# Patient Record
Sex: Female | Born: 1935
Health system: Southern US, Community
[De-identification: ages and names within clinical notes are randomized; demographics above are authoritative.]

## PROBLEM LIST (undated history)

## (undated) DIAGNOSIS — I5032 Chronic diastolic (congestive) heart failure: Secondary | ICD-10-CM

## (undated) DIAGNOSIS — K5900 Constipation, unspecified: Secondary | ICD-10-CM

## (undated) DIAGNOSIS — E785 Hyperlipidemia, unspecified: Secondary | ICD-10-CM

## (undated) DIAGNOSIS — E871 Hypo-osmolality and hyponatremia: Secondary | ICD-10-CM

## (undated) DIAGNOSIS — F419 Anxiety disorder, unspecified: Secondary | ICD-10-CM

## (undated) DIAGNOSIS — Q2112 Patent foramen ovale: Secondary | ICD-10-CM

## (undated) DIAGNOSIS — R002 Palpitations: Secondary | ICD-10-CM

## (undated) DIAGNOSIS — I253 Aneurysm of heart: Secondary | ICD-10-CM

## (undated) DIAGNOSIS — K760 Fatty (change of) liver, not elsewhere classified: Secondary | ICD-10-CM

## (undated) DIAGNOSIS — M545 Low back pain, unspecified: Secondary | ICD-10-CM

## (undated) DIAGNOSIS — F32A Depression, unspecified: Secondary | ICD-10-CM

## (undated) DIAGNOSIS — K648 Other hemorrhoids: Secondary | ICD-10-CM

## (undated) DIAGNOSIS — I639 Cerebral infarction, unspecified: Secondary | ICD-10-CM

## (undated) DIAGNOSIS — I2721 Secondary pulmonary arterial hypertension: Secondary | ICD-10-CM

## (undated) DIAGNOSIS — F329 Major depressive disorder, single episode, unspecified: Secondary | ICD-10-CM

## (undated) DIAGNOSIS — Q211 Atrial septal defect: Secondary | ICD-10-CM

## (undated) DIAGNOSIS — I10 Essential (primary) hypertension: Secondary | ICD-10-CM

## (undated) DIAGNOSIS — E039 Hypothyroidism, unspecified: Secondary | ICD-10-CM

## (undated) DIAGNOSIS — I214 Non-ST elevation (NSTEMI) myocardial infarction: Secondary | ICD-10-CM

## (undated) DIAGNOSIS — G8929 Other chronic pain: Secondary | ICD-10-CM

## (undated) DIAGNOSIS — J189 Pneumonia, unspecified organism: Secondary | ICD-10-CM

## (undated) DIAGNOSIS — K219 Gastro-esophageal reflux disease without esophagitis: Secondary | ICD-10-CM

## (undated) HISTORY — DX: Aneurysm of heart: I25.3

## (undated) HISTORY — DX: Fatty (change of) liver, not elsewhere classified: K76.0

## (undated) HISTORY — DX: Atrial septal defect: Q21.1

## (undated) HISTORY — DX: Other hemorrhoids: K64.8

## (undated) HISTORY — DX: Hypo-osmolality and hyponatremia: E87.1

## (undated) HISTORY — PX: CATARACT EXTRACTION W/ INTRAOCULAR LENS  IMPLANT, BILATERAL: SHX1307

## (undated) HISTORY — DX: Chronic diastolic (congestive) heart failure: I50.32

## (undated) HISTORY — DX: Essential (primary) hypertension: I10

## (undated) HISTORY — DX: Anxiety disorder, unspecified: F41.9

## (undated) HISTORY — DX: Patent foramen ovale: Q21.12

## (undated) HISTORY — DX: Constipation, unspecified: K59.00

## (undated) HISTORY — DX: Palpitations: R00.2

## (undated) HISTORY — DX: Gastro-esophageal reflux disease without esophagitis: K21.9

## (undated) HISTORY — DX: Cerebral infarction, unspecified: I63.9

## (undated) HISTORY — DX: Hyperlipidemia, unspecified: E78.5

## (undated) HISTORY — PX: ABDOMINAL HYSTERECTOMY: SHX81

## (undated) HISTORY — PX: VAGINAL HYSTERECTOMY: SUR661

## (undated) HISTORY — DX: Hypothyroidism, unspecified: E03.9

---

## 1999-10-29 ENCOUNTER — Emergency Department (HOSPITAL_COMMUNITY): Admission: EM | Admit: 1999-10-29 | Discharge: 1999-10-29 | Payer: Self-pay

## 1999-11-07 ENCOUNTER — Encounter: Payer: Self-pay | Admitting: Obstetrics

## 1999-11-07 ENCOUNTER — Ambulatory Visit (HOSPITAL_COMMUNITY): Admission: RE | Admit: 1999-11-07 | Discharge: 1999-11-07 | Payer: Self-pay | Admitting: Obstetrics

## 1999-12-29 ENCOUNTER — Encounter: Payer: Self-pay | Admitting: Cardiology

## 1999-12-29 ENCOUNTER — Ambulatory Visit (HOSPITAL_COMMUNITY): Admission: RE | Admit: 1999-12-29 | Discharge: 1999-12-29 | Payer: Self-pay | Admitting: Cardiology

## 2000-03-13 ENCOUNTER — Encounter: Payer: Self-pay | Admitting: Cardiology

## 2000-03-13 ENCOUNTER — Ambulatory Visit (HOSPITAL_COMMUNITY): Admission: RE | Admit: 2000-03-13 | Discharge: 2000-03-13 | Payer: Self-pay | Admitting: Cardiology

## 2000-04-01 ENCOUNTER — Emergency Department (HOSPITAL_COMMUNITY): Admission: EM | Admit: 2000-04-01 | Discharge: 2000-04-01 | Payer: Self-pay | Admitting: Emergency Medicine

## 2000-06-27 ENCOUNTER — Encounter: Payer: Self-pay | Admitting: Cardiology

## 2000-06-27 ENCOUNTER — Ambulatory Visit (HOSPITAL_COMMUNITY): Admission: RE | Admit: 2000-06-27 | Discharge: 2000-06-27 | Payer: Self-pay | Admitting: Cardiology

## 2001-02-25 ENCOUNTER — Encounter: Payer: Self-pay | Admitting: *Deleted

## 2001-02-25 ENCOUNTER — Ambulatory Visit (HOSPITAL_COMMUNITY): Admission: RE | Admit: 2001-02-25 | Discharge: 2001-02-25 | Payer: Self-pay | Admitting: *Deleted

## 2001-07-17 ENCOUNTER — Emergency Department (HOSPITAL_COMMUNITY): Admission: EM | Admit: 2001-07-17 | Discharge: 2001-07-17 | Payer: Self-pay | Admitting: Emergency Medicine

## 2001-09-04 ENCOUNTER — Encounter: Admission: RE | Admit: 2001-09-04 | Discharge: 2001-09-04 | Payer: Self-pay | Admitting: Family Medicine

## 2001-09-04 ENCOUNTER — Encounter: Payer: Self-pay | Admitting: Family Medicine

## 2001-09-23 ENCOUNTER — Ambulatory Visit (HOSPITAL_COMMUNITY): Admission: RE | Admit: 2001-09-23 | Discharge: 2001-09-23 | Payer: Self-pay | Admitting: Cardiology

## 2002-02-17 ENCOUNTER — Other Ambulatory Visit: Admission: RE | Admit: 2002-02-17 | Discharge: 2002-02-17 | Payer: Self-pay | Admitting: Obstetrics and Gynecology

## 2002-04-22 ENCOUNTER — Encounter: Admission: RE | Admit: 2002-04-22 | Discharge: 2002-04-22 | Payer: Self-pay | Admitting: Gastroenterology

## 2002-04-22 ENCOUNTER — Encounter: Payer: Self-pay | Admitting: Gastroenterology

## 2002-05-20 ENCOUNTER — Ambulatory Visit (HOSPITAL_COMMUNITY): Admission: RE | Admit: 2002-05-20 | Discharge: 2002-05-20 | Payer: Self-pay | Admitting: Gastroenterology

## 2002-09-30 ENCOUNTER — Ambulatory Visit (HOSPITAL_COMMUNITY): Admission: RE | Admit: 2002-09-30 | Discharge: 2002-09-30 | Payer: Self-pay | Admitting: Obstetrics and Gynecology

## 2002-09-30 ENCOUNTER — Encounter: Payer: Self-pay | Admitting: Obstetrics and Gynecology

## 2002-12-22 ENCOUNTER — Encounter: Payer: Self-pay | Admitting: Family Medicine

## 2002-12-22 ENCOUNTER — Encounter: Admission: RE | Admit: 2002-12-22 | Discharge: 2002-12-22 | Payer: Self-pay | Admitting: Family Medicine

## 2003-03-30 ENCOUNTER — Encounter: Payer: Self-pay | Admitting: Family Medicine

## 2003-03-30 ENCOUNTER — Ambulatory Visit (HOSPITAL_COMMUNITY): Admission: RE | Admit: 2003-03-30 | Discharge: 2003-03-30 | Payer: Self-pay | Admitting: Family Medicine

## 2003-10-06 ENCOUNTER — Ambulatory Visit (HOSPITAL_COMMUNITY): Admission: RE | Admit: 2003-10-06 | Discharge: 2003-10-06 | Payer: Self-pay | Admitting: Family Medicine

## 2004-02-16 ENCOUNTER — Encounter: Admission: RE | Admit: 2004-02-16 | Discharge: 2004-02-16 | Payer: Self-pay | Admitting: Family Medicine

## 2004-07-19 ENCOUNTER — Ambulatory Visit (HOSPITAL_COMMUNITY): Admission: RE | Admit: 2004-07-19 | Discharge: 2004-07-19 | Payer: Self-pay | Admitting: Cardiology

## 2004-07-26 ENCOUNTER — Ambulatory Visit: Admission: RE | Admit: 2004-07-26 | Discharge: 2004-07-26 | Payer: Self-pay | Admitting: Family Medicine

## 2005-06-08 ENCOUNTER — Encounter: Admission: RE | Admit: 2005-06-08 | Discharge: 2005-06-08 | Payer: Self-pay | Admitting: Family Medicine

## 2005-12-19 ENCOUNTER — Emergency Department (HOSPITAL_COMMUNITY): Admission: EM | Admit: 2005-12-19 | Discharge: 2005-12-19 | Payer: Self-pay | Admitting: Emergency Medicine

## 2006-06-10 ENCOUNTER — Encounter (HOSPITAL_COMMUNITY): Admission: RE | Admit: 2006-06-10 | Discharge: 2006-08-14 | Payer: Self-pay | Admitting: Cardiology

## 2006-12-11 ENCOUNTER — Encounter: Admission: RE | Admit: 2006-12-11 | Discharge: 2006-12-11 | Payer: Self-pay | Admitting: Family Medicine

## 2007-01-15 ENCOUNTER — Ambulatory Visit: Payer: Self-pay | Admitting: Cardiology

## 2007-01-15 ENCOUNTER — Encounter: Payer: Self-pay | Admitting: Cardiology

## 2007-01-15 ENCOUNTER — Inpatient Hospital Stay (HOSPITAL_COMMUNITY): Admission: EM | Admit: 2007-01-15 | Discharge: 2007-01-16 | Payer: Self-pay | Admitting: Internal Medicine

## 2007-08-12 ENCOUNTER — Encounter: Admission: RE | Admit: 2007-08-12 | Discharge: 2007-11-10 | Payer: Self-pay | Admitting: Family Medicine

## 2007-12-19 ENCOUNTER — Encounter: Admission: RE | Admit: 2007-12-19 | Discharge: 2007-12-19 | Payer: Self-pay | Admitting: Family Medicine

## 2008-01-14 ENCOUNTER — Encounter: Admission: RE | Admit: 2008-01-14 | Discharge: 2008-01-14 | Payer: Self-pay | Admitting: Family Medicine

## 2008-03-04 ENCOUNTER — Encounter: Admission: RE | Admit: 2008-03-04 | Discharge: 2008-04-15 | Payer: Self-pay | Admitting: Neurological Surgery

## 2008-11-02 ENCOUNTER — Encounter
Admission: RE | Admit: 2008-11-02 | Discharge: 2008-11-02 | Payer: Self-pay | Admitting: Physical Medicine and Rehabilitation

## 2009-06-30 ENCOUNTER — Encounter: Admission: RE | Admit: 2009-06-30 | Discharge: 2009-06-30 | Payer: Self-pay | Admitting: Orthopedic Surgery

## 2009-07-21 ENCOUNTER — Encounter: Admission: RE | Admit: 2009-07-21 | Discharge: 2009-07-21 | Payer: Self-pay | Admitting: Family Medicine

## 2010-04-26 ENCOUNTER — Encounter
Admission: RE | Admit: 2010-04-26 | Discharge: 2010-04-26 | Payer: Self-pay | Source: Home / Self Care | Admitting: Family Medicine

## 2010-12-24 ENCOUNTER — Encounter: Payer: Self-pay | Admitting: Cardiology

## 2010-12-24 ENCOUNTER — Encounter: Payer: Self-pay | Admitting: Family Medicine

## 2010-12-24 ENCOUNTER — Encounter: Payer: Self-pay | Admitting: Physical Medicine and Rehabilitation

## 2010-12-25 ENCOUNTER — Other Ambulatory Visit (HOSPITAL_COMMUNITY): Payer: Self-pay | Admitting: Gastroenterology

## 2010-12-25 ENCOUNTER — Encounter: Payer: Self-pay | Admitting: Family Medicine

## 2010-12-25 DIAGNOSIS — R52 Pain, unspecified: Secondary | ICD-10-CM

## 2011-01-03 ENCOUNTER — Encounter: Payer: Self-pay | Admitting: Gastroenterology

## 2011-01-08 ENCOUNTER — Other Ambulatory Visit (HOSPITAL_COMMUNITY): Payer: Self-pay

## 2011-01-09 ENCOUNTER — Inpatient Hospital Stay (HOSPITAL_COMMUNITY): Admission: RE | Admit: 2011-01-09 | Payer: Self-pay | Source: Ambulatory Visit

## 2011-02-01 ENCOUNTER — Inpatient Hospital Stay (HOSPITAL_COMMUNITY): Admission: RE | Admit: 2011-02-01 | Payer: Self-pay | Source: Ambulatory Visit

## 2011-02-15 ENCOUNTER — Other Ambulatory Visit: Payer: Self-pay | Admitting: Family Medicine

## 2011-02-15 DIAGNOSIS — Z1231 Encounter for screening mammogram for malignant neoplasm of breast: Secondary | ICD-10-CM

## 2011-02-21 ENCOUNTER — Inpatient Hospital Stay (HOSPITAL_COMMUNITY): Admission: RE | Admit: 2011-02-21 | Payer: Self-pay | Source: Ambulatory Visit

## 2011-03-06 ENCOUNTER — Encounter (HOSPITAL_COMMUNITY): Payer: Self-pay

## 2011-03-06 ENCOUNTER — Encounter (HOSPITAL_COMMUNITY)
Admission: RE | Admit: 2011-03-06 | Discharge: 2011-03-06 | Disposition: A | Discharge status: Home / Self Care | Payer: Medicare Other | Source: Ambulatory Visit | Attending: Gastroenterology | Admitting: Gastroenterology

## 2011-03-06 DIAGNOSIS — R109 Unspecified abdominal pain: Secondary | ICD-10-CM | POA: Insufficient documentation

## 2011-03-06 DIAGNOSIS — R52 Pain, unspecified: Secondary | ICD-10-CM

## 2011-03-06 MED ORDER — TECHNETIUM TC 99M SULFUR COLLOID: 2.0000 | Freq: Once | INTRAVENOUS | Status: AC | PRN

## 2011-03-28 ENCOUNTER — Ambulatory Visit: Payer: Self-pay

## 2011-04-05 ENCOUNTER — Ambulatory Visit: Payer: Self-pay

## 2011-04-12 ENCOUNTER — Inpatient Hospital Stay: Admission: RE | Admit: 2011-04-12 | Payer: Self-pay | Source: Ambulatory Visit

## 2011-04-20 NOTE — H&P (Signed)
Tammy Fernandez, Tammy Fernandez                ACCOUNT NO.:  [id number]   MEDICAL RECORD NO.:  [id number]          PATIENT TYPE:  EMS   LOCATION:  ED                           FACILITY:  [facility]   PHYSICIAN:  Kela Millin, M.D.DATE OF BIRTH:  1924-04-30   DATE OF ADMISSION:  01/14/2007  DATE OF DISCHARGE:                              HISTORY & PHYSICAL   PRIMARY CARE PHYSICIAN:  Lurena Joiner, M.D.   CHIEF COMPLAINT:  Shortness of breath.   HISTORY OF PRESENT ILLNESS:  The patient is an 77 year old black female  with past medical history significant for hypertension, hyperlipidemia,  and per family, recently treated as an outpatient for pneumonia, who  presents with complaints of shortness of breath. She states that she has  been short of breath for the past 2 to 3 days, denies chest pain. She  admits to a non-productive cough for the same duration and subjective  fevers. The patient denies leg swelling. No PND reported. She also  denies dysuria, nausea, vomiting, hematemesis, diarrhea, melena, and no  hematochezia.   The patient was seen in the emergency room and the chest x-ray was read  as consistent with mild congestive heart failure per radiologist. Her  lab work revealed a sodium of 119 and a brain natriuretic peptide of  241. Her point of care markers were negative. She is admitted to the  [facility] for further evaluation and management.   PAST MEDICAL HISTORY:  1. As stated above.  2. ? Anxiety/depression.  3. Status post cardiac catheterization in 2002, normal coronaries.   MEDICATIONS:  1. Meclizine 12.5 mg q.6 hours p.r.n.  2. Inderal 60 mg p.o. b.i.d.  3. Xanax 0.25 mg q.h.s. p.r.n.  4. Lipitor 10 mg daily.  5. Synthroid 50 mcg daily.   ALLERGIES:  SULFA.   SOCIAL HISTORY:  She denies tobacco. She also denies alcohol.   FAMILY HISTORY:  Her granddaughter has diabetes, cousin also with  diabetes.   REVIEW OF SYSTEMS:  As per HPI. Other review of  systems, negative.   PHYSICAL EXAMINATION:  GENERAL:  The patient is an elderly, obese, black  female in no respiratory distress with nasal cannula oxygen on.  VITAL SIGNS:  Temperature 98.1, blood pressure 157/77. Pulse 59.  Respiratory rate 20. O2 sat 98%.  HEENT:  PERRL. EO MI. Sclerae anicteric. Moist mucous membranes. No oral  exudates.  NECK:  Supple. No adenopathy. No JVD and no thyromegaly. No carotid  bruits appreciated.  LUNGS:  Decreased breath sounds at the bases. NO wheezes, no crackles  appreciated.  CARDIOVASCULAR:  Regular rate and rhythm. Normal S1 and S2. No gallops.  ABDOMEN:  Soft, obese. Bowel sounds present. Nontender, and  nondistended. No organomegaly and no masses palpable.  EXTREMITIES:  No cyanosis and no edema.  NEUROLOGIC:  Alert and oriented times three. Cranial nerves 2-12 are  grossly intact. A non-focal examination.   LABORATORY DATA:  Chest x-ray:  Mild CHF per radiologist.   White blood cell count 8.7 with hemoglobin and hematocrit of 14.6 and  42.5. Platelet count 222,000. Urinalysis is negative  for infection.  Specific gravity is 1.028. Point of care markers are negative. Brain  natriuretic peptide is 241.  Sodium 119, potassium 4.2, chloride 79, CO2 33, glucose 169, BUN 5.  Creatinine is pending.   ASSESSMENT/PLAN:  1. CONGESTIVE HEART FAILURE:  As discussed above, an 77 year old black      female presenting with shortness of breath and chest x-ray with      mild congestive heart failure, no leg edema on examination. The      patient received Lasix per emergency room physician. Will obtain      cardiac enzymes, 2-D echocardiogram and also a D-dimer. Fluid      restriction, daily weight, and follow.  2. HYPONATREMIA:  Gradual onset, patient denies symptoms. ? Secondary      to congestive heart failure, will obtain urine electrolytes. Also      TSH. Fluid restriction is already mentioned above. Follow, recheck      sodium and further  management as appropriate, pending above      studies.  3. HYPERTENSION:  Continue Inderal.  4. HYPERLIPIDEMIA:  Continue Lipitor.  5. HISTORY OF ANXIETY:  Continue Xanax.      Kela Millin, M.D.  Electronically Signed     ACV/MEDQ  D:  01/14/2007  T:  01/14/2007  Job:  [id number]   cc:   Lurena Joiner, M.D.  [facility]

## 2011-04-20 NOTE — Cardiovascular Report (Signed)
[name]  Patient:    Tammy Fernandez, Tammy Fernandez Visit Number: [id number] MRN: [id number]          Service Type: CAT Location: [facility] 2899 01 Attending Physician:  Corliss Marcus Dictated by:   Francisca December, M.D. Proc. Date: 09/23/01 Admit Date:  09/23/2001 Discharge Date: 09/23/2001   CC:         Dario Guardian, M.D.  Cardiac Catheterization Laboratory   Cardiac Catheterization  PROCEDURES PERFORMED: 1. Left heart catheterization. 2. Coronary angiography. 3. Left ventriculogram.  INDICATIONS: The patient is a 77 year old woman with atypical chest pain and palpitations. She underwent a recent myocardial perfusion study showing a partial reversible anterior wall defect extending from the mid to near the apex but not including the apex. There is also decreased inferior wall perfusion with improvement at rest suggesting a two-vessel level of atherosclerotic coronary vascular disease. She is brought now to the catheterization laboratory to confirm the same and provide for further therapeutic options.  DESCRIPTION OF PROCEDURE: Left heart catheterization was performed following a percutaneous insertion of a 5 French catheter sheath utilizing an anterior approach over a guiding J wire into the right femoral artery. A 110 cm pigtail catheter was used to measure pressures in the ascending aorta and in the left ventricle both prior to and following the ventriculogram. A 30 degree RAO cine left ventriculogram was performed utilizing a power injector.  Following the sublingual administration of 0.4 mg of nitroglycerin, coronary angiography was performed using 5 [place] #4 left and right Judkins catheters. Cineangiography of each coronary artery was conducted in multiple LAO and RAO projections. At the completion of the procedure, the catheter and catheter sheaths were removed. Hemostasis was achieved by direct pressure. The patient was transported to the  recovery area in stable condition with intact distal pulses.  FLUOROSCOPY TIME: 3.3 minutes.  TOTAL CONTRAST UTILIZED: Omnipaque 100 cc.  HEMODYNAMICS: Systemic arterial pressure was 174/81 with a mean 120 mmHg. There was no systolic gradient across the aortic valve area. The left ventricular end-diastolic pressure was 22 mmHg preventriculogram and 24 mmHg post ventriculogram.  ANGIOGRAPHY: Left ventriculogram demonstrated normal chamber size and normal global systolic function with a calculated ejection fraction utilizing a single plane cine method of 76%. There were no regional wall motion abnormalities. There was no significant mitral regurgitation nor was there coronary calcification seen.  There was a right dominant coronary system present. The main left coronary artery was normal.  The left anterior descending artery and its branches appeared normal. However, there was aposity of vessels in the anterolateral distribution of the LV just lateral to the LAD. This suggest the possibility of flush occlusion of a diagonal branch. There was a first diagonal branch that was moderate in size without significant instruction and second and third diagonal branches were very small and arose from the distal LAD.  The left circumflex coronary artery and its branches were normal. The vessel gives rise to a large first marginal and then a large second marginal following a third small obtuse marginal. There is no significant obstruction seen within these vessels.  The right coronary artery and its branches were relatively small but without significant obstruction. It gave rise to a small to moderate sized posterior descending artery and a very small posterolateral segment and branch.  Collateral vessels were not seen.  FINAL IMPRESSION: 1. Essentially normal coronary arteriography. The possibility of a flush    occlusion of the diagonal branch could not be excluded.  This would     correspond to the location of her perfusion imaging defect. 2. Intact left ventricular size and global systolic function. Ejection    fraction 76%.  PLAN/RECOMENDATIONS: Recommend aspirin only on a regular basis, 81 mg daily. Followup with myself on a p.r.n. basis. Continue Synthroid, Zocor, Xanax, Inderal. Dictated by:   Francisca December, M.D. Attending Physician:  Corliss Marcus DD:  09/23/01 TD:  09/24/01 Job: [id number]

## 2011-04-20 NOTE — Procedures (Signed)
[facility]  Patient:    Tammy Fernandez, FROMM Visit Number: [id number] MRN: [id number]          Service Type: END Location: ENDO Attending Physician:  Louie Bun Dictated by:   Everardo All Madilyn Fireman, M.D. Proc. Date: 05/20/02 Admit Date:  05/20/2002   CC:         [name]. Cliffton Asters, M.D.   Procedure Report  PROCEDURE:  Colonoscopy.  INDICATION FOR PROCEDURE:  Heme positive stools with barium enema suggesting a small irregular mass or polyp in the sigmoid colon.  DESCRIPTION OF PROCEDURE:  The patient was placed in the left lateral decubitus position then placed on the pulse monitor with continuous low flow oxygen delivered by nasal cannula. She was sedated with 75 mg IV Demerol and 7 mg IV Versed. The Olympus video colonoscope was inserted into the rectum and advanced to the cecum, confirmed by transillumination at McBurneys point and visualization of the ileocecal valve and appendiceal orifice. The prep was good but somewhat suboptimal in some areas. The cecum, ascending, transverse and descending colon all appeared normal with no masses, polyps, diverticula or other mucosal abnormalities. The sigmoid colon was very carefully explored both in the right lateral decubitus position and the left lateral decubitus position with some areas of adherent stool compulsively lavaged until all areas of the mucosa were easily visualized. Despite this, I do not see any lesion corresponding with the suggested abnormality on the barium enema. It was felt this probably represented a piece of adherent stool at the time. There were no diverticula seen. The rectum appeared normal and a retroflexed view of the anus did reveal some small internal hemorrhoids. The colonoscope was then withdrawn and the patient returned to the recovery room in stable condition. The patient tolerated the procedure well and there were no immediate complications.  IMPRESSION:  Internal  hemorrhoids otherwise normal study with no evidence of sigmoid colon mass or polyp.  PLAN:  Based on her heme positive stools will repeat Hemoccults in approximately four weeks and if still positive consider upper GI evaluation. Dictated by:   Everardo All Madilyn Fireman, M.D. Attending Physician:  Louie Bun DD:  05/20/02 TD:  05/21/02 Job: [id number]

## 2011-04-25 ENCOUNTER — Ambulatory Visit: Payer: Self-pay

## 2011-06-28 ENCOUNTER — Ambulatory Visit
Admission: RE | Admit: 2011-06-28 | Discharge: 2011-06-28 | Disposition: A | Discharge status: Home / Self Care | Payer: Medicare Other | Source: Ambulatory Visit | Attending: Family Medicine | Admitting: Family Medicine

## 2011-06-28 DIAGNOSIS — Z1231 Encounter for screening mammogram for malignant neoplasm of breast: Secondary | ICD-10-CM

## 2011-07-10 ENCOUNTER — Telehealth: Payer: Self-pay | Admitting: Gastroenterology

## 2011-07-10 NOTE — Telephone Encounter (Signed)
Forwarded to Dr. Russella Dar for review per [facility] of [facility] release.

## 2011-07-16 ENCOUNTER — Encounter: Payer: Self-pay | Admitting: Gastroenterology

## 2011-08-13 ENCOUNTER — Ambulatory Visit: Payer: Medicare Other | Admitting: Gastroenterology

## 2011-09-05 ENCOUNTER — Ambulatory Visit: Payer: Medicare Other | Admitting: Gastroenterology

## 2011-09-05 ENCOUNTER — Encounter: Payer: Self-pay | Admitting: Gastroenterology

## 2011-10-01 ENCOUNTER — Ambulatory Visit: Payer: Medicare Other | Admitting: Gastroenterology

## 2011-10-01 ENCOUNTER — Encounter: Payer: Self-pay | Admitting: Gastroenterology

## 2011-10-18 ENCOUNTER — Ambulatory Visit: Payer: Medicare Other | Admitting: Gastroenterology

## 2011-10-23 ENCOUNTER — Ambulatory Visit: Payer: Medicare Other | Admitting: Gastroenterology

## 2011-10-30 ENCOUNTER — Ambulatory Visit: Payer: Medicare Other | Admitting: Gastroenterology

## 2011-11-14 ENCOUNTER — Encounter: Payer: Self-pay | Admitting: Gastroenterology

## 2011-11-14 ENCOUNTER — Ambulatory Visit (INDEPENDENT_AMBULATORY_CARE_PROVIDER_SITE_OTHER): Payer: Medicare Other | Admitting: Gastroenterology

## 2011-11-14 DIAGNOSIS — K5900 Constipation, unspecified: Secondary | ICD-10-CM

## 2011-11-14 DIAGNOSIS — K219 Gastro-esophageal reflux disease without esophagitis: Secondary | ICD-10-CM | POA: Insufficient documentation

## 2011-11-14 NOTE — Progress Notes (Signed)
HPI: This is a  very pleasant 77 year old woman.  She has canceled 7 or 8 appointments with [place] over the past 2-3 months. She no showed for another appointment.  Long term patient of Dr. Madilyn Fireman.  Everything she eats she has gas, eructations.  She was discharged from [facility] for failure to show for many appointments.  She has BM every 1-3 days to move her bowels. Usually needs prune juice.  Sometimes taken magnesium.  She was sick with "sinus infections, diarrhea."  She does not take nexium, rather is on prevacid.  Takes this every morning about one hour before breakfast.    No vomiting. No overt bleeding.  No dysphagia.   Her weight is stable over past several years.  I reviewed records from Dr. Madilyn Fireman. Colonoscopy June 2003 done for Hemoccult-positive stool and abnormal barium enema showed internal hemorrhoids without masses or polyps. Upper endoscopy October 2007 done for dyspepsia was completely normal. The duodenum was biopsied and it was normal as well she had a gastric emptying scan April 2000 and 12th and this was normal also she had an abdominal ultrasound May 2011 and her liver and gallbladder were normal.  She saw Dr. Madilyn Fireman one year ago for the same complaints she has today (irregular bowel habits and food coming back into her throat, GERD)       Review of systems: Pertinent positive and negative review of systems were noted in the above HPI section. Complete review of systems was performed and was otherwise normal.    Past Medical History  Diagnosis Date  . Hyperlipidemia   . Hypertension   . Anxiety   . GERD (gastroesophageal reflux disease)   . Abnormal heart rhythms   . Fatty liver   . Internal hemorrhoid     Past Surgical History  Procedure Date  . Cesarean section   . Abdominal hysterectomy     Current Outpatient Prescriptions  Medication Sig Dispense Refill  . ALPRAZolam (XANAX) 0.5 MG tablet Take 0.5 mg by mouth at bedtime as needed.        Marland Kitchen esomeprazole  (NEXIUM) 40 MG capsule Take 40 mg by mouth daily before breakfast.        . levothyroxine (SYNTHROID, LEVOTHROID) 50 MCG tablet Take 50 mcg by mouth daily.        . meclizine (ANTIVERT) 12.5 MG tablet Take 12.5 mg by mouth 3 (three) times daily as needed.        . naproxen sodium (ANAPROX) 550 MG tablet Take 550 mg by mouth 2 (two) times daily with a meal.        . olopatadine (PATANOL) 0.1 % ophthalmic solution Place 1 drop into both eyes 2 (two) times daily.        . propranolol (INDERAL) 60 MG tablet Take 60 mg by mouth 2 (two) times daily.          Allergies as of 11/14/2011 - Review Complete 11/14/2011  Allergen Reaction Noted  . Crestor (rosuvastatin calcium)  08/03/2011  . Sulfa drugs cross reactors Rash 08/03/2011    Family History  Problem Relation Age of Onset  . Stroke Father   . Hypertension Father   . Colon cancer Daughter   . Colon cancer Cousin   . Colon cancer      Aunt  . Diabetes      aunt and cousins  . Heart disease      cousins    History   Social History  . Marital Status: Widowed  Spouse Name: N/A    Number of Children: 7  . Years of Education: N/A   Occupational History  . Not on file.   Social History Main Topics  . Smoking status: Never Smoker   . Smokeless tobacco: Never Used  . Alcohol Use: No  . Drug Use: No  . Sexually Active: Not on file   Other Topics Concern  . Not on file   Social History Narrative  . No narrative on file       Physical Exam: BP 144/80  Pulse 70  Ht 5\' 2"  (1.575 m)  Wt 201 lb 6.4 oz (91.354 kg)  BMI 36.84 kg/m2  SpO2 95% Constitutional: generally well-appearing,  obese, she wore her sunglasses throughout the visit today  Psychiatric: alert and oriented x3 Eyes: extraocular movements intact Mouth: oral pharynx moist, no lesions Neck: supple no lymphadenopathy Cardiovascular: heart regular rate and rhythm Lungs: clear to auscultation bilaterally Abdomen: soft, nontender, nondistended, no obvious  ascites, no peritoneal signs, normal bowel sounds Extremities: no lower extremity edema bilaterally Skin: no lesions on visible extremities    Assessment and plan: 77 y.o. female with  obesity, chronic dyspeptic complaints, chronic bowel complaints  her dyspepsia may be GERD related. I going to have her double her proton pump inhibitor. She will also begin fiber supplements to try to help with her mild, chronic constipation. She will call back to report on her symptoms in 4-5 weeks and sooner if needed. She has no alarm symptoms that would necessitate endoscopic evaluation.

## 2011-11-14 NOTE — Patient Instructions (Signed)
Double your PPI (prevacid), one pill twice a day (20-30 min before BF and dinner meals). Please start taking citrucel (orange flavored) powder fiber supplement.  This may cause some bloating at first but that usually goes away. Begin with a small spoonful and work your way up to a large, heaping spoonful daily over a week. Call Dr. Christella Hartigan' office in 5-6 weeks to report on your symptoms. A copy of this information will be made available to Dr. Nicholos Johns.

## 2011-12-28 ENCOUNTER — Telehealth: Payer: Self-pay | Admitting: Gastroenterology

## 2011-12-28 NOTE — Telephone Encounter (Signed)
Message copied by Arna Snipe on Fri Dec 28, 2011 10:53 AM ------      Message from: Donata Duff      Created: Tue Oct 30, 2011 12:34 PM       Do not bill

## 2012-01-16 ENCOUNTER — Other Ambulatory Visit: Payer: Self-pay | Admitting: Orthopedic Surgery

## 2012-01-22 ENCOUNTER — Other Ambulatory Visit: Payer: Self-pay | Admitting: Orthopedic Surgery

## 2012-01-24 ENCOUNTER — Ambulatory Visit (HOSPITAL_BASED_OUTPATIENT_CLINIC_OR_DEPARTMENT_OTHER): Admission: RE | Admit: 2012-01-24 | Payer: Medicare Other | Source: Ambulatory Visit | Admitting: Orthopedic Surgery

## 2012-01-24 ENCOUNTER — Encounter (HOSPITAL_BASED_OUTPATIENT_CLINIC_OR_DEPARTMENT_OTHER): Admission: RE | Payer: Self-pay | Source: Ambulatory Visit

## 2012-01-24 SURGERY — ULNAR NERVE DECOMPRESSION/TRANSPOSITION
Anesthesia: Regional | Laterality: Left

## 2012-03-17 ENCOUNTER — Ambulatory Visit: Payer: Medicare Other | Admitting: Gastroenterology

## 2012-04-01 ENCOUNTER — Ambulatory Visit: Payer: Medicare Other | Admitting: Gastroenterology

## 2012-04-22 ENCOUNTER — Ambulatory Visit: Payer: Medicare Other | Admitting: Gastroenterology

## 2012-05-05 ENCOUNTER — Encounter: Payer: Self-pay | Admitting: Gastroenterology

## 2012-05-05 ENCOUNTER — Ambulatory Visit (INDEPENDENT_AMBULATORY_CARE_PROVIDER_SITE_OTHER): Payer: Medicare Other | Admitting: Gastroenterology

## 2012-05-05 VITALS — BP 136/68 | HR 76 | Ht 62.0 in | Wt 198.0 lb

## 2012-05-05 DIAGNOSIS — K3189 Other diseases of stomach and duodenum: Secondary | ICD-10-CM

## 2012-05-05 DIAGNOSIS — R1013 Epigastric pain: Secondary | ICD-10-CM

## 2012-05-05 DIAGNOSIS — K5900 Constipation, unspecified: Secondary | ICD-10-CM

## 2012-05-05 MED ORDER — ESOMEPRAZOLE MAGNESIUM 40 MG PO CPDR: 40.0000 mg | DELAYED_RELEASE_CAPSULE | Freq: Every day | ORAL | Status: DC

## 2012-05-05 NOTE — Patient Instructions (Addendum)
Change the gas-ex so that you take it shortly before meals. Restart your nexium, take one pill 20-30 min before breakfast meal. Please start taking citrucel (orange flavored) powder fiber supplement.  This may cause some bloating at first but that usually goes away. Begin with a small spoonful and work your way up to a large, heaping spoonful daily over a week. You will have labs checked today in the basement lab.  Please head down after you check out with the front desk (cbc). A copy of this information will be made available to Dr. Nicholos Johns. Call to report on your symptoms in 6 weeks.

## 2012-05-05 NOTE — Progress Notes (Signed)
HPI: This is a pleasant 78 year old woman whom I last saw 6-7 months ago. At that time we discussed her chronic constipation I recommend fiber supplements. She did not try those. We also discussed some gassy sensation she was having after eating and I recommended she double her proton pump inhibitor. She is currently not even on a proton pump inhibitor. She takes Tagamet once daily. She is here today with essentially the same set of symptoms that I saw her for 6-7 months ago. She also believes she needs a colonoscopy for routine screening.   Reviewed previous records: records from Dr. Madilyn Fireman. Colonoscopy June 2003 done for Hemoccult-positive stool and abnormal barium enema showed internal hemorrhoids without masses or polyps. Upper endoscopy October 2007 done for dyspepsia was completely normal. The duodenum was biopsied and it was normal as well she had a gastric emptying scan April 2000 and 12th and this was normal also she had an abdominal ultrasound May 2011 and her liver and gallbladder were normal.   She has had constipation for 15 years, no changes lately.  She drinks prune juice periodically about every other day.  Also milk of magnesia peridically.  Daughter did not have colon cancer, just polyps per patient today.    Past Medical History  Diagnosis Date  . Hyperlipidemia   . Hypertension   . Anxiety   . GERD (gastroesophageal reflux disease)   . Abnormal heart rhythms   . Fatty liver   . Internal hemorrhoid     Past Surgical History  Procedure Date  . Cesarean section   . Abdominal hysterectomy     Current Outpatient Prescriptions  Medication Sig Dispense Refill  . ALPRAZolam (XANAX) 0.5 MG tablet Take 0.5 mg by mouth at bedtime as needed.        . Calcium & Magnesium Carbonates (MYLANTA PO) Take by mouth as needed.      . calcium carbonate (TUMS - DOSED IN MG ELEMENTAL CALCIUM) 500 MG chewable tablet Chew 1 tablet by mouth as needed.      . Cimetidine (TAGAMET PO)  Take 1 capsule by mouth daily.      Marland Kitchen levothyroxine (SYNTHROID, LEVOTHROID) 50 MCG tablet Take 50 mcg by mouth daily.        . meclizine (ANTIVERT) 12.5 MG tablet Take 12.5 mg by mouth 3 (three) times daily as needed.        . propranolol (INDERAL) 60 MG tablet Take 60 mg by mouth 2 (two) times daily.        . Simethicone (GAS-X PO) Take by mouth as needed.        Allergies as of 05/05/2012 - Review Complete 05/05/2012  Allergen Reaction Noted  . Crestor (rosuvastatin calcium)  08/03/2011  . Sulfa drugs cross reactors Rash 08/03/2011    Family History  Problem Relation Age of Onset  . Stroke Father   . Hypertension Father   . Colon cancer Daughter   . Colon cancer Cousin   . Colon cancer      Aunt  . Diabetes      aunt and cousins  . Heart disease      cousins    History   Social History  . Marital Status: Widowed    Spouse Name: N/A    Number of Children: 7  . Years of Education: N/A   Occupational History  . Not on file.   Social History Main Topics  . Smoking status: Never Smoker   . Smokeless tobacco: Never  Used  . Alcohol Use: No  . Drug Use: No  . Sexually Active: Not on file   Other Topics Concern  . Not on file   Social History Narrative  . No narrative on file      Physical Exam: BP 136/68  Pulse 76  Ht 5\' 2"  (1.575 m)  Wt 198 lb (89.812 kg)  BMI 36.21 kg/m2 Constitutional: Morbidly obese, wearing sunglasses Psychiatric: alert and oriented x3 Abdomen: soft, nontender, nondistended, no obvious ascites, no peritoneal signs, normal bowel sounds     Assessment and plan: 78 y.o. female with chronic constipation, dyspepsia  I essentially recommended the same things I recommended at her last visit. She is 78 years old and I did explain to her that generally we stop screening for colon cancer between age of 57 and 74. Her daughter actually did not have colon cancer that she recalls now it sounds like it was more of an ovarian or gynecologic  cancer. She does have chronic constipation but this has not changed in many years. She has seen no bleeding. I do not think she requires upper or lower endoscopies. She will call to report on her symptoms after restarting her proton pump inhibitor and getting fiber supplements to try.  Cbc today to check for anemia

## 2012-05-26 ENCOUNTER — Telehealth: Payer: Self-pay | Admitting: Gastroenterology

## 2012-05-26 NOTE — Telephone Encounter (Signed)
Forward to Dr. Christella Hartigan for review on 05-26-12 ym

## 2012-10-02 ENCOUNTER — Telehealth: Payer: Self-pay | Admitting: Gastroenterology

## 2012-10-02 NOTE — Telephone Encounter (Signed)
Line busy

## 2012-10-03 NOTE — Telephone Encounter (Signed)
Left message on machine to call back  

## 2012-10-06 NOTE — Telephone Encounter (Signed)
I have tried on several occasions to reach the pt and have not gotten an answer and no return call.  I will await further communication from the pt

## 2012-11-03 ENCOUNTER — Other Ambulatory Visit: Payer: Self-pay | Admitting: Family Medicine

## 2012-11-03 ENCOUNTER — Ambulatory Visit: Payer: Medicare Other | Admitting: Gastroenterology

## 2012-11-03 DIAGNOSIS — Z1231 Encounter for screening mammogram for malignant neoplasm of breast: Secondary | ICD-10-CM

## 2012-11-21 ENCOUNTER — Encounter: Payer: Self-pay | Admitting: Gastroenterology

## 2012-11-21 ENCOUNTER — Ambulatory Visit (INDEPENDENT_AMBULATORY_CARE_PROVIDER_SITE_OTHER): Payer: Medicare Other | Admitting: Gastroenterology

## 2012-11-21 VITALS — BP 142/80 | HR 60 | Ht 62.0 in | Wt 199.0 lb

## 2012-11-21 DIAGNOSIS — K219 Gastro-esophageal reflux disease without esophagitis: Secondary | ICD-10-CM

## 2012-11-21 NOTE — Progress Notes (Signed)
HPI: This is a    78 year old woman who I last saw 6 months ago.  I reviewed her records from that visit.  I also reviewed my written recommendations from that visit and it does not appear that she has tried any of these. That is the same case at her visit 6 months ago. I reviewed her recent note from her primary care physician where his written she is trying to get back into see her previous gastroenterologist, Dr. Madilyn Fireman.   She tells me she did that because I wouldn't do a colonoscopy for her previously. The note from her primary care physician says she actually wants to establish care with a different gastroenterologist. This is in October 2013 note, 4 months after had already seen her   She has the heartburn and gas.     Past Medical History  Diagnosis Date  . Hyperlipidemia   . Hypertension   . Anxiety   . GERD (gastroesophageal reflux disease)   . Abnormal heart rhythms   . Fatty liver   . Internal hemorrhoid     Past Surgical History  Procedure Date  . Cesarean section   . Abdominal hysterectomy     Current Outpatient Prescriptions  Medication Sig Dispense Refill  . ALPRAZolam (XANAX) 0.5 MG tablet Take 0.5 mg by mouth at bedtime as needed.        . Calcium & Magnesium Carbonates (MYLANTA PO) Take by mouth as needed.      . calcium carbonate (TUMS - DOSED IN MG ELEMENTAL CALCIUM) 500 MG chewable tablet Chew 1 tablet by mouth as needed.      . Cimetidine (TAGAMET PO) Take 1 capsule by mouth daily.      Marland Kitchen levothyroxine (SYNTHROID, LEVOTHROID) 50 MCG tablet Take 50 mcg by mouth daily.        . meclizine (ANTIVERT) 12.5 MG tablet Take 12.5 mg by mouth 3 (three) times daily as needed.        . propranolol (INDERAL) 60 MG tablet Take 60 mg by mouth 2 (two) times daily.        . Simethicone (GAS-X PO) Take by mouth as needed.        Allergies as of 11/21/2012 - Review Complete 11/21/2012  Allergen Reaction Noted  . Crestor (rosuvastatin calcium)  08/03/2011  . Sulfa drugs  cross reactors Rash 08/03/2011    Family History  Problem Relation Age of Onset  . Stroke Father   . Hypertension Father   . Colon cancer Cousin   . Colon cancer      Aunt  . Diabetes      aunt and cousins  . Heart disease      cousins    History   Social History  . Marital Status: Widowed    Spouse Name: N/A    Number of Children: 7  . Years of Education: N/A   Occupational History  . Not on file.   Social History Main Topics  . Smoking status: Never Smoker   . Smokeless tobacco: Never Used  . Alcohol Use: No  . Drug Use: No  . Sexually Active: Not on file   Other Topics Concern  . Not on file   Social History Narrative  . No narrative on file      Physical Exam: BP 142/80  Pulse 60  Ht 5\' 2"  (1.575 m)  Wt 199 lb (90.266 kg)  BMI 36.40 kg/m2 Constitutional: generally well-appearing Psychiatric: alert and oriented x3 Abdomen: soft, nontender,  nondistended, no obvious ascites, no peritoneal signs, normal bowel sounds     Assessment and plan: 78 y.o. female with  GERD, gas bloating  I reviewed the exact same recommendations which I may for her 6 months ago. I do not think she has tried any of them. I actually cannot tell if she even remembers the previous visit. She has been trying to get back in to see her earlier,previous according to recent October note from her primary care physician. She was quite rude during the visit today and we obviously didn't see I about her symptoms, treatment options. She was very clear that she just wanted to look at her colon and look in her stomach.

## 2012-11-21 NOTE — Patient Instructions (Addendum)
Change the gas-ex so that you take it shortly before meals.  Restart your nexium, take one pill 20-30 min before breakfast meal.  Please continue taking citrucel (orange flavored) powder fiber supplement. A copy of this information will be made available to Dr. Nicholos Johns again. Return to see Dr. Christella Hartigan in 2 months. Continue tagamet, this is best taken at bedtime every night.

## 2012-12-09 ENCOUNTER — Ambulatory Visit: Payer: Medicare Other

## 2013-01-08 ENCOUNTER — Ambulatory Visit: Payer: Medicare Other

## 2013-01-20 ENCOUNTER — Other Ambulatory Visit: Payer: Self-pay | Admitting: Family Medicine

## 2013-01-20 DIAGNOSIS — K219 Gastro-esophageal reflux disease without esophagitis: Secondary | ICD-10-CM

## 2013-01-20 DIAGNOSIS — R109 Unspecified abdominal pain: Secondary | ICD-10-CM

## 2013-01-27 ENCOUNTER — Other Ambulatory Visit: Payer: Self-pay | Admitting: Nurse Practitioner

## 2013-01-27 ENCOUNTER — Other Ambulatory Visit (HOSPITAL_COMMUNITY)
Admission: RE | Admit: 2013-01-27 | Discharge: 2013-01-27 | Disposition: A | Discharge status: Home / Self Care | Payer: Medicare Other | Source: Ambulatory Visit | Attending: Obstetrics and Gynecology | Admitting: Obstetrics and Gynecology

## 2013-01-27 DIAGNOSIS — N760 Acute vaginitis: Secondary | ICD-10-CM | POA: Insufficient documentation

## 2013-01-29 ENCOUNTER — Ambulatory Visit: Payer: Medicare Other

## 2013-02-02 ENCOUNTER — Other Ambulatory Visit: Payer: Medicare Other

## 2013-02-05 ENCOUNTER — Other Ambulatory Visit: Payer: Medicare Other

## 2013-02-26 ENCOUNTER — Ambulatory Visit
Admission: RE | Admit: 2013-02-26 | Discharge: 2013-02-26 | Disposition: A | Discharge status: Home / Self Care | Payer: Medicare Other | Source: Ambulatory Visit | Attending: Family Medicine | Admitting: Family Medicine

## 2013-02-26 DIAGNOSIS — Z1231 Encounter for screening mammogram for malignant neoplasm of breast: Secondary | ICD-10-CM

## 2013-08-07 ENCOUNTER — Ambulatory Visit: Payer: Medicare Other | Admitting: Gastroenterology

## 2013-08-14 ENCOUNTER — Other Ambulatory Visit (HOSPITAL_COMMUNITY): Payer: Self-pay | Admitting: Cardiology

## 2013-08-14 DIAGNOSIS — R079 Chest pain, unspecified: Secondary | ICD-10-CM

## 2013-08-24 ENCOUNTER — Encounter (HOSPITAL_COMMUNITY): Payer: PRIVATE HEALTH INSURANCE

## 2013-08-27 ENCOUNTER — Other Ambulatory Visit: Payer: Self-pay

## 2013-08-27 ENCOUNTER — Encounter (HOSPITAL_COMMUNITY)
Admission: RE | Admit: 2013-08-27 | Discharge: 2013-08-27 | Disposition: A | Discharge status: Home / Self Care | Payer: PRIVATE HEALTH INSURANCE | Source: Ambulatory Visit | Attending: Cardiology | Admitting: Cardiology

## 2013-08-27 DIAGNOSIS — R079 Chest pain, unspecified: Secondary | ICD-10-CM | POA: Insufficient documentation

## 2013-08-27 LAB — LIPID PANEL: Cholesterol: 220 mg/dL — ABNORMAL HIGH (ref 0–200)

## 2013-08-27 LAB — BASIC METABOLIC PANEL
BUN: 10 mg/dL (ref 6–23)
CO2: 31 mEq/L (ref 19–32)
Calcium: 9.8 mg/dL (ref 8.4–10.5)
Creatinine, Ser: 0.63 mg/dL (ref 0.50–1.10)
GFR calc non Af Amer: 78 mL/min — ABNORMAL LOW (ref >90)
Glucose, Bld: 114 mg/dL — ABNORMAL HIGH (ref 70–99)
Sodium: 130 mEq/L — ABNORMAL LOW (ref 135–145)

## 2013-08-27 LAB — HEPATIC FUNCTION PANEL
ALT: 18 U/L (ref 0–35)
AST: 52 U/L — ABNORMAL HIGH (ref 0–37)
Albumin: 3.5 g/dL (ref 3.5–5.2)
Alkaline Phosphatase: 67 U/L (ref 39–117)
Total Bilirubin: 0.4 mg/dL (ref 0.3–1.2)
Total Protein: 8.3 g/dL (ref 6.0–8.3)

## 2013-08-27 MED ORDER — TECHNETIUM TC 99M SESTAMIBI GENERIC - CARDIOLITE
30.0000 | Freq: Once | INTRAVENOUS | Status: AC | PRN
  Administered 2013-08-27: 30 via INTRAVENOUS

## 2013-08-27 MED ORDER — REGADENOSON 0.4 MG/5ML IV SOLN
0.4000 mg | Freq: Once | INTRAVENOUS | Status: AC
  Administered 2013-08-27: 0.4 mg via INTRAVENOUS

## 2013-08-27 MED ORDER — TECHNETIUM TC 99M SESTAMIBI GENERIC - CARDIOLITE
10.0000 | Freq: Once | INTRAVENOUS | Status: AC | PRN
  Administered 2013-08-27: 10 via INTRAVENOUS

## 2013-08-27 MED ORDER — REGADENOSON 0.4 MG/5ML IV SOLN
INTRAVENOUS | Status: AC
  Filled 2013-08-27: qty 5

## 2013-08-28 ENCOUNTER — Encounter (HOSPITAL_COMMUNITY): Payer: PRIVATE HEALTH INSURANCE

## 2013-12-03 DIAGNOSIS — I639 Cerebral infarction, unspecified: Secondary | ICD-10-CM

## 2013-12-03 HISTORY — DX: Cerebral infarction, unspecified: I63.9

## 2014-01-11 ENCOUNTER — Emergency Department (HOSPITAL_COMMUNITY): Payer: PRIVATE HEALTH INSURANCE

## 2014-01-11 ENCOUNTER — Encounter (HOSPITAL_COMMUNITY): Payer: Self-pay | Admitting: Emergency Medicine

## 2014-01-11 ENCOUNTER — Other Ambulatory Visit: Payer: Self-pay

## 2014-01-11 ENCOUNTER — Inpatient Hospital Stay (HOSPITAL_COMMUNITY)
Admission: EM | Admit: 2014-01-11 | Discharge: 2014-01-19 | DRG: 064 | Disposition: A | Discharge status: Rehabilitation Facility | Payer: PRIVATE HEALTH INSURANCE | Attending: Internal Medicine | Admitting: Internal Medicine

## 2014-01-11 DIAGNOSIS — I639 Cerebral infarction, unspecified: Secondary | ICD-10-CM | POA: Diagnosis present

## 2014-01-11 DIAGNOSIS — R1310 Dysphagia, unspecified: Secondary | ICD-10-CM | POA: Diagnosis present

## 2014-01-11 DIAGNOSIS — J9600 Acute respiratory failure, unspecified whether with hypoxia or hypercapnia: Secondary | ICD-10-CM | POA: Diagnosis present

## 2014-01-11 DIAGNOSIS — Z6833 Body mass index (BMI) 33.0-33.9, adult: Secondary | ICD-10-CM

## 2014-01-11 DIAGNOSIS — A419 Sepsis, unspecified organism: Secondary | ICD-10-CM | POA: Diagnosis not present

## 2014-01-11 DIAGNOSIS — Q2111 Secundum atrial septal defect: Secondary | ICD-10-CM

## 2014-01-11 DIAGNOSIS — Z8249 Family history of ischemic heart disease and other diseases of the circulatory system: Secondary | ICD-10-CM

## 2014-01-11 DIAGNOSIS — I10 Essential (primary) hypertension: Secondary | ICD-10-CM | POA: Diagnosis present

## 2014-01-11 DIAGNOSIS — K219 Gastro-esophageal reflux disease without esophagitis: Secondary | ICD-10-CM | POA: Diagnosis present

## 2014-01-11 DIAGNOSIS — A4102 Sepsis due to Methicillin resistant Staphylococcus aureus: Secondary | ICD-10-CM | POA: Diagnosis not present

## 2014-01-11 DIAGNOSIS — G9340 Encephalopathy, unspecified: Secondary | ICD-10-CM | POA: Diagnosis present

## 2014-01-11 DIAGNOSIS — J9601 Acute respiratory failure with hypoxia: Secondary | ICD-10-CM | POA: Diagnosis present

## 2014-01-11 DIAGNOSIS — E785 Hyperlipidemia, unspecified: Secondary | ICD-10-CM | POA: Diagnosis present

## 2014-01-11 DIAGNOSIS — I6350 Cerebral infarction due to unspecified occlusion or stenosis of unspecified cerebral artery: Principal | ICD-10-CM | POA: Diagnosis present

## 2014-01-11 DIAGNOSIS — N390 Urinary tract infection, site not specified: Secondary | ICD-10-CM | POA: Diagnosis present

## 2014-01-11 DIAGNOSIS — G8194 Hemiplegia, unspecified affecting left nondominant side: Secondary | ICD-10-CM

## 2014-01-11 DIAGNOSIS — F411 Generalized anxiety disorder: Secondary | ICD-10-CM | POA: Diagnosis present

## 2014-01-11 DIAGNOSIS — R7881 Bacteremia: Secondary | ICD-10-CM | POA: Diagnosis present

## 2014-01-11 DIAGNOSIS — I2690 Septic pulmonary embolism without acute cor pulmonale: Secondary | ICD-10-CM | POA: Diagnosis not present

## 2014-01-11 DIAGNOSIS — R5381 Other malaise: Secondary | ICD-10-CM | POA: Diagnosis present

## 2014-01-11 DIAGNOSIS — I69391 Dysphagia following cerebral infarction: Secondary | ICD-10-CM

## 2014-01-11 DIAGNOSIS — G8190 Hemiplegia, unspecified affecting unspecified side: Secondary | ICD-10-CM | POA: Diagnosis present

## 2014-01-11 DIAGNOSIS — E875 Hyperkalemia: Secondary | ICD-10-CM | POA: Diagnosis present

## 2014-01-11 DIAGNOSIS — Z882 Allergy status to sulfonamides status: Secondary | ICD-10-CM

## 2014-01-11 DIAGNOSIS — Z7982 Long term (current) use of aspirin: Secondary | ICD-10-CM

## 2014-01-11 DIAGNOSIS — I63511 Cerebral infarction due to unspecified occlusion or stenosis of right middle cerebral artery: Secondary | ICD-10-CM | POA: Diagnosis present

## 2014-01-11 DIAGNOSIS — Z823 Family history of stroke: Secondary | ICD-10-CM

## 2014-01-11 DIAGNOSIS — Z79899 Other long term (current) drug therapy: Secondary | ICD-10-CM

## 2014-01-11 DIAGNOSIS — E871 Hypo-osmolality and hyponatremia: Secondary | ICD-10-CM | POA: Diagnosis present

## 2014-01-11 DIAGNOSIS — E039 Hypothyroidism, unspecified: Secondary | ICD-10-CM | POA: Diagnosis present

## 2014-01-11 DIAGNOSIS — Z888 Allergy status to other drugs, medicaments and biological substances status: Secondary | ICD-10-CM

## 2014-01-11 DIAGNOSIS — Z833 Family history of diabetes mellitus: Secondary | ICD-10-CM

## 2014-01-11 DIAGNOSIS — R319 Hematuria, unspecified: Secondary | ICD-10-CM | POA: Diagnosis present

## 2014-01-11 DIAGNOSIS — I253 Aneurysm of heart: Secondary | ICD-10-CM | POA: Diagnosis present

## 2014-01-11 DIAGNOSIS — R569 Unspecified convulsions: Secondary | ICD-10-CM | POA: Diagnosis present

## 2014-01-11 DIAGNOSIS — R414 Neurologic neglect syndrome: Secondary | ICD-10-CM | POA: Diagnosis present

## 2014-01-11 DIAGNOSIS — G9341 Metabolic encephalopathy: Secondary | ICD-10-CM | POA: Diagnosis present

## 2014-01-11 DIAGNOSIS — B9620 Unspecified Escherichia coli [E. coli] as the cause of diseases classified elsewhere: Secondary | ICD-10-CM | POA: Diagnosis present

## 2014-01-11 DIAGNOSIS — E236 Other disorders of pituitary gland: Secondary | ICD-10-CM | POA: Diagnosis present

## 2014-01-11 DIAGNOSIS — Q211 Atrial septal defect: Secondary | ICD-10-CM

## 2014-01-11 DIAGNOSIS — R29810 Facial weakness: Secondary | ICD-10-CM | POA: Diagnosis present

## 2014-01-11 LAB — COMPREHENSIVE METABOLIC PANEL
ALBUMIN: 3.2 g/dL — AB (ref 3.5–5.2)
ALT: 11 U/L (ref 0–35)
AST: 22 U/L (ref 0–37)
Alkaline Phosphatase: 89 U/L (ref 39–117)
BILIRUBIN TOTAL: 0.5 mg/dL (ref 0.3–1.2)
BUN: 16 mg/dL (ref 6–23)
CALCIUM: 10 mg/dL (ref 8.4–10.5)
CHLORIDE: 72 meq/L — AB (ref 96–112)
CO2: 27 mEq/L (ref 19–32)
CREATININE: 0.75 mg/dL (ref 0.50–1.10)
GFR calc Af Amer: 86 mL/min — ABNORMAL LOW (ref >90)
GFR calc non Af Amer: 74 mL/min — ABNORMAL LOW (ref >90)
Glucose, Bld: 157 mg/dL — ABNORMAL HIGH (ref 70–99)
Potassium: 5.6 mEq/L — ABNORMAL HIGH (ref 3.7–5.3)
Sodium: 114 mEq/L — CL (ref 137–147)
TOTAL PROTEIN: 8.3 g/dL (ref 6.0–8.3)

## 2014-01-11 LAB — BASIC METABOLIC PANEL
BUN: 17 mg/dL (ref 6–23)
CHLORIDE: 77 meq/L — AB (ref 96–112)
CO2: 27 meq/L (ref 19–32)
Calcium: 9.5 mg/dL (ref 8.4–10.5)
Creatinine, Ser: 0.7 mg/dL (ref 0.50–1.10)
GFR calc Af Amer: 88 mL/min — ABNORMAL LOW (ref >90)
GFR calc non Af Amer: 76 mL/min — ABNORMAL LOW (ref >90)
Glucose, Bld: 150 mg/dL — ABNORMAL HIGH (ref 70–99)
Potassium: 6.4 mEq/L — ABNORMAL HIGH (ref 3.7–5.3)
Sodium: 119 mEq/L — CL (ref 137–147)

## 2014-01-11 LAB — CBC
HCT: 41.5 % (ref 36.0–46.0)
Hemoglobin: 15 g/dL (ref 12.0–15.0)
MCH: 30.6 pg (ref 26.0–34.0)
MCHC: 36.1 g/dL — ABNORMAL HIGH (ref 30.0–36.0)
MCV: 84.7 fL (ref 78.0–100.0)
Platelets: 321 10*3/uL (ref 150–400)
RBC: 4.9 MIL/uL (ref 3.87–5.11)
RDW: 12.8 % (ref 11.5–15.5)
WBC: 11.9 10*3/uL — ABNORMAL HIGH (ref 4.0–10.5)

## 2014-01-11 LAB — URINE MICROSCOPIC-ADD ON

## 2014-01-11 LAB — DIFFERENTIAL
BASOS ABS: 0 10*3/uL (ref 0.0–0.1)
Basophils Relative: 0 % (ref 0–1)
EOS ABS: 0 10*3/uL (ref 0.0–0.7)
Eosinophils Relative: 0 % (ref 0–5)
Lymphocytes Relative: 23 % (ref 12–46)
Lymphs Abs: 2.7 10*3/uL (ref 0.7–4.0)
MONO ABS: 0.4 10*3/uL (ref 0.1–1.0)
Monocytes Relative: 4 % (ref 3–12)
NEUTROS ABS: 8.8 10*3/uL — AB (ref 1.7–7.7)
Neutrophils Relative %: 74 % (ref 43–77)

## 2014-01-11 LAB — URINALYSIS, ROUTINE W REFLEX MICROSCOPIC
Bilirubin Urine: NEGATIVE
GLUCOSE, UA: NEGATIVE mg/dL
KETONES UR: 15 mg/dL — AB
Nitrite: NEGATIVE
Protein, ur: 30 mg/dL — AB
Specific Gravity, Urine: 1.017 (ref 1.005–1.030)
UROBILINOGEN UA: 0.2 mg/dL (ref 0.0–1.0)
pH: 6.5 (ref 5.0–8.0)

## 2014-01-11 LAB — APTT: APTT: 30 s (ref 24–37)

## 2014-01-11 LAB — RAPID URINE DRUG SCREEN, HOSP PERFORMED
AMPHETAMINES: NOT DETECTED
Barbiturates: NOT DETECTED
Benzodiazepines: POSITIVE — AB
COCAINE: NOT DETECTED
OPIATES: NOT DETECTED
Tetrahydrocannabinol: NOT DETECTED

## 2014-01-11 LAB — POCT I-STAT TROPONIN I: TROPONIN I, POC: 0 ng/mL (ref 0.00–0.08)

## 2014-01-11 LAB — POCT I-STAT, CHEM 8
BUN: 19 mg/dL (ref 6–23)
CREATININE: 1 mg/dL (ref 0.50–1.10)
Calcium, Ion: 1.05 mmol/L — ABNORMAL LOW (ref 1.13–1.30)
Chloride: 79 mEq/L — ABNORMAL LOW (ref 96–112)
Glucose, Bld: 160 mg/dL — ABNORMAL HIGH (ref 70–99)
HCT: 51 % — ABNORMAL HIGH (ref 36.0–46.0)
Hemoglobin: 17.3 g/dL — ABNORMAL HIGH (ref 12.0–15.0)
POTASSIUM: 5.7 meq/L — AB (ref 3.7–5.3)
Sodium: 115 mEq/L — CL (ref 137–147)
TCO2: 33 mmol/L (ref 0–100)

## 2014-01-11 LAB — PROTIME-INR
INR: 1.01 (ref 0.00–1.49)
Prothrombin Time: 13.1 seconds (ref 11.6–15.2)

## 2014-01-11 LAB — GLUCOSE, CAPILLARY: GLUCOSE-CAPILLARY: 162 mg/dL — AB (ref 70–99)

## 2014-01-11 LAB — ETHANOL: Alcohol, Ethyl (B): <11 mg/dL (ref 0–11)

## 2014-01-11 LAB — TROPONIN I

## 2014-01-11 MED ORDER — ASPIRIN 325 MG PO TABS
325.0000 mg | ORAL_TABLET | Freq: Every day | ORAL | Status: DC
  Administered 2014-01-16 – 2014-01-18 (×3): 325 mg via ORAL
  Filled 2014-01-11 (×8): qty 1

## 2014-01-11 MED ORDER — SODIUM CHLORIDE 0.9 % IV BOLUS (SEPSIS)
1000.0000 mL | Freq: Once | INTRAVENOUS | Status: AC
  Administered 2014-01-11: 1000 mL via INTRAVENOUS

## 2014-01-11 MED ORDER — LORAZEPAM 2 MG/ML IJ SOLN
0.5000 mg | Freq: Two times a day (BID) | INTRAMUSCULAR | Status: DC | PRN
  Administered 2014-01-15: 0.5 mg via INTRAVENOUS
  Filled 2014-01-11: qty 1

## 2014-01-11 MED ORDER — LABETALOL HCL 5 MG/ML IV SOLN
10.0000 mg | INTRAVENOUS | Status: DC | PRN
  Administered 2014-01-11: 10 mg via INTRAVENOUS
  Filled 2014-01-11: qty 4

## 2014-01-11 MED ORDER — SODIUM CHLORIDE 0.9 % IV SOLN
INTRAVENOUS | Status: DC
  Administered 2014-01-11 – 2014-01-12 (×2): via INTRAVENOUS

## 2014-01-11 MED ORDER — PANTOPRAZOLE SODIUM 40 MG IV SOLR
40.0000 mg | Freq: Every day | INTRAVENOUS | Status: DC
  Administered 2014-01-12 – 2014-01-15 (×5): 40 mg via INTRAVENOUS
  Filled 2014-01-11 (×7): qty 40

## 2014-01-11 MED ORDER — SENNOSIDES-DOCUSATE SODIUM 8.6-50 MG PO TABS
1.0000 | ORAL_TABLET | Freq: Every evening | ORAL | Status: DC | PRN
  Filled 2014-01-11: qty 1

## 2014-01-11 MED ORDER — METOPROLOL TARTRATE 1 MG/ML IV SOLN
2.5000 mg | Freq: Four times a day (QID) | INTRAVENOUS | Status: DC
  Administered 2014-01-11 – 2014-01-13 (×6): 2.5 mg via INTRAVENOUS
  Filled 2014-01-11 (×10): qty 5

## 2014-01-11 MED ORDER — ACETAMINOPHEN 650 MG RE SUPP
650.0000 mg | RECTAL | Status: DC | PRN
  Administered 2014-01-11 – 2014-01-13 (×3): 650 mg via RECTAL
  Filled 2014-01-11 (×4): qty 1

## 2014-01-11 MED ORDER — ASPIRIN 300 MG RE SUPP
300.0000 mg | Freq: Every day | RECTAL | Status: DC
  Administered 2014-01-12 – 2014-01-15 (×4): 300 mg via RECTAL
  Filled 2014-01-11 (×8): qty 1

## 2014-01-11 MED ORDER — DEXTROSE 5 % IV SOLN
1.0000 g | INTRAVENOUS | Status: DC
  Administered 2014-01-11 – 2014-01-12 (×2): 1 g via INTRAVENOUS
  Filled 2014-01-11 (×3): qty 10

## 2014-01-11 MED ORDER — WHITE PETROLATUM GEL
Status: AC
  Administered 2014-01-12: 03:00:00
  Filled 2014-01-11: qty 5

## 2014-01-11 MED ORDER — ACETAMINOPHEN 325 MG PO TABS
650.0000 mg | ORAL_TABLET | ORAL | Status: DC | PRN
  Administered 2014-01-17 – 2014-01-19 (×4): 650 mg via ORAL
  Filled 2014-01-11 (×4): qty 2

## 2014-01-11 MED ORDER — SODIUM POLYSTYRENE SULFONATE 15 GM/60ML PO SUSP
45.0000 g | Freq: Once | ORAL | Status: AC
  Administered 2014-01-12: 45 g via RECTAL
  Filled 2014-01-11: qty 180

## 2014-01-11 NOTE — ED Notes (Signed)
Sodium 114 Dr Preston FleetingGlick informed.

## 2014-01-11 NOTE — ED Provider Notes (Signed)
CSN: [id number]     Arrival date & time 01/11/14  1525 History   None    Chief Complaint  Patient presents with  . Code Stroke    HPI: Ms. Marcon is an 80 yo F with history of HLD and HTN who presents with left sided weakness. History is obtained from EMS as she is unable to give history. Apparently she was in her normal state of health last night at 6:00 pm. Today her son noted she was looking to the right and not as vocal as she normally is. She would not eat or drink fluids. She has had upper respiratory symptoms for several days recently but apparently was improving. Her family also endorses increased water drinking for several days given a UTI. When she stopped talking today he called EMS. On their arrival she was noted to have left sided neglect. She denies any recent fall, no pain.    Past Medical History  Diagnosis Date  . Hyperlipidemia   . Hypertension   . Anxiety   . GERD (gastroesophageal reflux disease)   . Abnormal heart rhythms   . Fatty liver   . Internal hemorrhoid    Past Surgical History  Procedure Laterality Date  . Cesarean section    . Abdominal hysterectomy     Family History  Problem Relation Age of Onset  . Stroke Father   . Hypertension Father   . Colon cancer Cousin   . Colon cancer      Aunt  . Diabetes      aunt and cousins  . Heart disease      cousins   History  Substance Use Topics  . Smoking status: Never Smoker   . Smokeless tobacco: Never Used  . Alcohol Use: No   OB History   Grav Para Term Preterm Abortions TAB SAB Ect Mult Living                 Review of Systems  Unable to perform ROS     Allergies  Crestor and Sulfa drugs cross reactors  Home Medications   Current Outpatient Rx  Name  Route  Sig  Dispense  Refill  . ALPRAZolam (XANAX) 0.5 MG tablet   Oral   Take 0.5 mg by mouth at bedtime as needed.           . Calcium & Magnesium Carbonates (MYLANTA PO)   Oral   Take by mouth as needed.         . calcium  carbonate (TUMS - DOSED IN MG ELEMENTAL CALCIUM) 500 MG chewable tablet   Oral   Chew 1 tablet by mouth as needed.         . Cimetidine (TAGAMET PO)   Oral   Take 1 capsule by mouth daily.         Marland Kitchen levothyroxine (SYNTHROID, LEVOTHROID) 50 MCG tablet   Oral   Take 50 mcg by mouth daily.           . meclizine (ANTIVERT) 12.5 MG tablet   Oral   Take 12.5 mg by mouth 3 (three) times daily as needed.           . propranolol (INDERAL) 60 MG tablet   Oral   Take 60 mg by mouth 2 (two) times daily.           . Simethicone (GAS-X PO)   Oral   Take by mouth as needed.  BP 177/88  Pulse 78  Resp 23  SpO2 98% Physical Exam  Nursing note and vitals reviewed. Constitutional: She is easily aroused. She appears ill. No distress.  Elderly female, laying in bed, looks to the right.   HENT:  Head: Normocephalic and atraumatic.  Mouth/Throat: Mucous membranes are dry.  Eyes: Conjunctivae and EOM are normal. Pupils are equal, round, and reactive to light.  Neck: Normal range of motion. Neck supple.  Cardiovascular: Normal rate, regular rhythm and intact distal pulses.  Exam reveals distant heart sounds.   Pulmonary/Chest: Effort normal and breath sounds normal. No respiratory distress.  Abdominal: Soft. Bowel sounds are normal. There is no tenderness. There is no rebound and no guarding.  Musculoskeletal: Normal range of motion. She exhibits no edema and no tenderness.  Neurological: She is easily aroused. She is disoriented (alert to person only). GCS eye subscore is 4. GCS verbal subscore is 4. GCS motor subscore is 6.  Constant rightward gaze. Does not move eyes to the left. Left sided neglect. Left sided facial droop. Normal strength and sensation in right UE and LE. 2/5 strength left arm and leg. DTR's 1+ in all extremities. Gait not tested.   Skin: Skin is warm and dry. No rash noted.  Psychiatric: She has a normal mood and affect. Her behavior is normal.    ED  Course  Procedures (including critical care time) Labs Review Labs Reviewed  CBC - Abnormal; Notable for the following:    WBC 11.9 (*)    MCHC 36.1 (*)    All other components within normal limits  DIFFERENTIAL - Abnormal; Notable for the following:    Neutro Abs 8.8 (*)    All other components within normal limits  COMPREHENSIVE METABOLIC PANEL - Abnormal; Notable for the following:    Sodium 114 (*)    Potassium 5.6 (*)    Chloride 72 (*)    Glucose, Bld 157 (*)    Albumin 3.2 (*)    GFR calc non Af Amer 74 (*)    GFR calc Af Amer 86 (*)    All other components within normal limits  URINE RAPID DRUG SCREEN (HOSP PERFORMED) - Abnormal; Notable for the following:    Benzodiazepines POSITIVE (*)    All other components within normal limits  URINALYSIS, ROUTINE W REFLEX MICROSCOPIC - Abnormal; Notable for the following:    APPearance CLOUDY (*)    Hgb urine dipstick LARGE (*)    Ketones, ur 15 (*)    Protein, ur 30 (*)    Leukocytes, UA MODERATE (*)    All other components within normal limits  GLUCOSE, CAPILLARY - Abnormal; Notable for the following:    Glucose-Capillary 162 (*)    All other components within normal limits  URINE MICROSCOPIC-ADD ON - Abnormal; Notable for the following:    Squamous Epithelial / LPF FEW (*)    Bacteria, UA MANY (*)    Casts HYALINE CASTS (*)    All other components within normal limits   Imaging Review Ct Head Wo Contrast  01/11/2014   CLINICAL DATA:  Left-sided weakness with right-sided gaze and slurred speech. Hypertension. Hyperlipidemia.  EXAM: CT HEAD WITHOUT CONTRAST  TECHNIQUE: Contiguous axial images were obtained from the base of the skull through the vertex without intravenous contrast.  COMPARISON:  No comparisons available.  FINDINGS: No intracranial hemorrhage.  Question tiny infarct superior left lenticular nucleus of questionable age.  No CT evidence of large acute infarct.  No intracranial mass  lesion noted on this unenhanced  exam.  No hydrocephalus.  IMPRESSION: No intracranial hemorrhage.  Question tiny infarct superior left lenticular nucleus of questionable age.  No CT evidence of large acute infarct.  No intracranial mass lesion noted on this unenhanced exam.   Electronically Signed   By: Bridgett Larsson M.D.   On: 01/11/2014 16:10   Mr Angiogram Head Wo Contrast  01/11/2014   CLINICAL DATA:  Acute onset of inability to speak, gaze to the right and left paralysis. History of hypertension and hyperlipidemia.  EXAM: MRI HEAD WITHOUT CONTRAST  TECHNIQUE: Multiplanar, multiecho pulse sequences of the brain and surrounding structures were obtained without intravenous contrast. 3D time-of-flight MRA through the circle of Willis with maximum intensity projected reformations.  COMPARISON:  CT of the head January 11, 2014 at 1602 hr  FINDINGS: MRI head: Mild motion degraded examination. No reduced diffusion to suggest acute ischemia. No susceptibility artifact to suggest hemorrhage.  The ventricles and sulci are normal for patient's age. Scattered subcentimeter supratentorial white matter T2 hyperintensities are less than expected for age without midline shift or mass effect. Subtle asymmetric T2 hyperintense signal within the right temporal cortex, axial 8/21, apparent on the axial FLAIR sequence as well though, less conspicuous on the coronal T2.  No abnormal extra-axial fluid collections. Basal cisterns are patent.  Status post bilateral ocular lens implants. Paranasal sinuses and mastoid air cells appear well-aerated. No abnormal sellar expansion, cerebellar tonsils descend less than 5 mm below the foramen magnum, and do not appear pointed in appearance.  MRA head: Normal flow related enhancement of the cervical internal carotid arteries, with slight signal loss at a tortuous segment of the right cervical internal carotid artery, consistent with anatomical variant. Normal flow related enhancement of the petrous internal carotid arteries.  There is a long segment of narrowing of the right cavernous internal carotid artery, less than 50% with normal flow related enhancement supra clinoid internal carotid arteries. The left A1 segment is congenitally dominant, with predominately azygos anterior cerebral artery. The right middle cerebral artery is generally smaller than the left, this may be in part accentuated by tilt within the scanner. There is poor flow early enhancement of the right M3 branches, best seen on the reformations.  Nearly codominant vertebral arteries with normal flow of enhanced in the vertebrobasilar system and main branch vessels. Normal flow related enhancement of posterior cerebral arteries.  No large vessel occlusion, aneurysm, suspicious luminal irregularity within the anterior or posterior circulation. Very mild diffuse irregularity of the intracranial vessels suggests atherosclerosis.  IMPRESSION: MRI of the head: No convincing evidence of reduced diffusion to suggest acute ischemia, however there is mild bright signal within the right temporal cortex, in an area predominately obscured on the diffusion-weighted sequences and, early stroke could have this appearance.  Otherwise unremarkable MRI of the head for age.  MRA of the head: Poor enhancement of right M3 branches concerning for high-grade stenosis/ possible embolus.  Narrowing of the right carotid siphon without hemodynamically significant stenosis through the circle Willis.  Critical Value/emergent results were called by telephone at the time of interpretation on 01/11/2014 at 10:31 PM to Dr. Dione Booze , who verbally acknowledged these results.   Electronically Signed   By: Awilda Metro   On: 01/11/2014 22:33   Mr Brain Wo Contrast  01/11/2014   CLINICAL DATA:  Acute onset of inability to speak, gaze to the right and left paralysis. History of hypertension and hyperlipidemia.  EXAM: MRI HEAD WITHOUT  CONTRAST  TECHNIQUE: Multiplanar, multiecho pulse sequences of the  brain and surrounding structures were obtained without intravenous contrast. 3D time-of-flight MRA through the circle of Willis with maximum intensity projected reformations.  COMPARISON:  CT of the head January 11, 2014 at 1602 hr  FINDINGS: MRI head: Mild motion degraded examination. No reduced diffusion to suggest acute ischemia. No susceptibility artifact to suggest hemorrhage.  The ventricles and sulci are normal for patient's age. Scattered subcentimeter supratentorial white matter T2 hyperintensities are less than expected for age without midline shift or mass effect. Subtle asymmetric T2 hyperintense signal within the right temporal cortex, axial 8/21, apparent on the axial FLAIR sequence as well though, less conspicuous on the coronal T2.  No abnormal extra-axial fluid collections. Basal cisterns are patent.  Status post bilateral ocular lens implants. Paranasal sinuses and mastoid air cells appear well-aerated. No abnormal sellar expansion, cerebellar tonsils descend less than 5 mm below the foramen magnum, and do not appear pointed in appearance.  MRA head: Normal flow related enhancement of the cervical internal carotid arteries, with slight signal loss at a tortuous segment of the right cervical internal carotid artery, consistent with anatomical variant. Normal flow related enhancement of the petrous internal carotid arteries. There is a long segment of narrowing of the right cavernous internal carotid artery, less than 50% with normal flow related enhancement supra clinoid internal carotid arteries. The left A1 segment is congenitally dominant, with predominately azygos anterior cerebral artery. The right middle cerebral artery is generally smaller than the left, this may be in part accentuated by tilt within the scanner. There is poor flow early enhancement of the right M3 branches, best seen on the reformations.  Nearly codominant vertebral arteries with normal flow of enhanced in the  vertebrobasilar system and main branch vessels. Normal flow related enhancement of posterior cerebral arteries.  No large vessel occlusion, aneurysm, suspicious luminal irregularity within the anterior or posterior circulation. Very mild diffuse irregularity of the intracranial vessels suggests atherosclerosis.  IMPRESSION: MRI of the head: No convincing evidence of reduced diffusion to suggest acute ischemia, however there is mild bright signal within the right temporal cortex, in an area predominately obscured on the diffusion-weighted sequences and, early stroke could have this appearance.  Otherwise unremarkable MRI of the head for age.  MRA of the head: Poor enhancement of right M3 branches concerning for high-grade stenosis/ possible embolus.  Narrowing of the right carotid siphon without hemodynamically significant stenosis through the circle Willis.  Critical Value/emergent results were called by telephone at the time of interpretation on 01/11/2014 at 10:31 PM to Dr. Dione Booze , who verbally acknowledged these results.   Electronically Signed   By: Awilda Metro   On: 01/11/2014 22:33   Dg Chest Port 1 View  01/12/2014   CLINICAL DATA:  Fever.  EXAM: PORTABLE CHEST - 1 VIEW  COMPARISON:  NM MYOCAR MULTI w/SPECT w/WALL MOTION / EF dated 08/27/2013  FINDINGS: Patient is rotated to the right. Chronic elevation of the right hemidiaphragm opacity at the right chest. Tortuous thoracic aorta appears similar. Lung volumes are lower than on prior studies. Monitoring leads project over the chest. There is no gross focal consolidation identified. Cardiomediastinal contours appear similar allowing for rotation.  IMPRESSION: Lower lung volumes with chronic elevation of the right hemidiaphragm. No gross acute cardiopulmonary disease.   Electronically Signed   By: Andreas Newport M.D.   On: 01/12/2014 01:27      MDM     80 yo  F with history of HLD and HTN who presents with rightward gaze, left sided weakness  and left sided neglect.  CBG 162. No code stroke initiated as her last normal time was yesterday afternoon. Concern for intracranial hemorrhage versus ischemic stroke.  Urgent CT scan of the head without contrast did not reveal any acute abnormality. Chest x-ray did not reveal any acute abnormalities. Laboratory studies were significant for a sodium of 115, potassium 5.6, chloride 72. The ideology of her electoral abnormalities is unclear perhaps SIADH versus volume depletion. She does appear dehydrated on exam. Treated with one liter normal saline. Lower suspicion for infectious ideology as she has no fever. Her white blood cell count is 11.9 but felt to be secondary to hemo-concentration, as her hemoglobin is 15. Her urinalysis did show moderate leukocytes with 7 to 10 white blood cells on microscopic, treated with Rocephin. Neurology was consulted, they felt she likely suffered a large ischemic MCA territory CVA. I spoke to the patient and her family about her diagnosis and need for admission. They were in agreement with plan. The patient was admitted to the hospitalist service in stable condition.  Reviewed imaging, ECG, laboratory studies and previous medical records, utilized in medical decision-making  Discuss case with Dr. Preston Fleeting  Clinical impression 1. Ischemic stroke 2. Hyponatremia 3. Urinary tract infection 4. Hyperkalemia without ECG changes   Margie Billet, MD 01/12/14 2007

## 2014-01-11 NOTE — ED Notes (Signed)
NOTIFIED DR. Patria ManeAMPOS IN PERSON OF PATIENTS LAB RESULTS OF I- STAT TROPONIN ,AND I- STAT CHEM 8+ ,17/45 PM ,01/11/2014.

## 2014-01-11 NOTE — Consult Note (Addendum)
Referring Physician: Dr. Preston Fleeting    Chief Complaint: Left-sided weakness and inability to speak as well as gaze to the right.  HPI: Tammy Fernandez is an 80 y.o. female with a history of hypertension and hyperlipidemia who developed acute onset of inability to speak, gaze to the right and paralysis of left extremities earlier today. Patient was last seen well at 9:30 this morning. She has no previous history of stroke nor TIA. She's been taking aspirin daily. CT scan of her head showed no acute intracranial abnormality. NIH stroke score was 22.  LSN: 9:30 AM on 01/11/2014 tPA Given: No: Beyond time under for treatment consideration MRankin: 4  Past Medical History  Diagnosis Date  . Hyperlipidemia   . Hypertension   . Anxiety   . GERD (gastroesophageal reflux disease)   . Abnormal heart rhythms   . Fatty liver   . Internal hemorrhoid     Family History  Problem Relation Age of Onset  . Stroke Father   . Hypertension Father   . Colon cancer Cousin   . Colon cancer      Aunt  . Diabetes      aunt and cousins  . Heart disease      cousins     Medications: I have reviewed the patient's current medications.  ROS: History obtained from child  General ROS: negative for - chills, fatigue, fever, night sweats, weight gain or weight loss Psychological ROS: negative for - behavioral disorder, hallucinations, memory difficulties, mood swings or suicidal ideation Ophthalmic ROS: negative for - blurry vision, double vision, eye pain or loss of vision ENT ROS: negative for - epistaxis, nasal discharge, oral lesions, sore throat, tinnitus or vertigo Allergy and Immunology ROS: negative for - hives or itchy/watery eyes Hematological and Lymphatic ROS: negative for - bleeding problems, bruising or swollen lymph nodes Endocrine ROS: negative for - galactorrhea, hair pattern changes, polydipsia/polyuria or temperature intolerance Respiratory ROS: Recent flulike symptoms Cardiovascular ROS:  negative for - chest pain, dyspnea on exertion, edema or irregular heartbeat Gastrointestinal ROS: Vomiting and diarrhea during the last week Genito-Urinary ROS: negative for - dysuria, hematuria, incontinence or urinary frequency/urgency Musculoskeletal ROS: negative for - joint swelling or muscular weakness Neurological ROS: as noted in HPI Dermatological ROS: negative for rash and skin lesion changes  Physical Examination: Blood pressure 177/88, pulse 78, resp. rate 23, SpO2 98.00%.  Neurologic Examination: Mental Status: Lethargic, speech was garbled and unintelligible for the most part. Able to follow commands with use of right. extremities. Patient had total neglect of left side. Cranial Nerves: II-dense right homonymous hemianopsia. III/IV/VI-Pupils were equal and reacted. Persistent gaze conjugately to the right.    V/VII-no response to tactile stimulation the left side of face; moderately severe left lower facial weakness. VIII-normal. X-severe dysarthria. Motor: Flaccid paralysis of left upper and lower extremities; normal strength and tone of right extremities. Sensory: Total neglect of left side. Deep Tendon Reflexes: Trace to 1+ and symmetric. Cerebellar: Unable to adequately test..   Ct Head Wo Contrast  01/11/2014   CLINICAL DATA:  Left-sided weakness with right-sided gaze and slurred speech. Hypertension. Hyperlipidemia.  EXAM: CT HEAD WITHOUT CONTRAST  TECHNIQUE: Contiguous axial images were obtained from the base of the skull through the vertex without intravenous contrast.  COMPARISON:  No comparisons available.  FINDINGS: No intracranial hemorrhage.  Question tiny infarct superior left lenticular nucleus of questionable age.  No CT evidence of large acute infarct.  No intracranial mass lesion noted on this unenhanced  exam.  No hydrocephalus.  IMPRESSION: No intracranial hemorrhage.  Question tiny infarct superior left lenticular nucleus of questionable age.  No CT  evidence of large acute infarct.  No intracranial mass lesion noted on this unenhanced exam.   Electronically Signed   By: Bridgett LarssonSteve  Olson M.D.   On: 01/11/2014 16:10    Assessment: 80 y.o. female with multiple risk factors for stroke presenting with probable large right MCA territory acute ischemic infarction.  Stroke Risk Factors - hypertension and hyperlipidemia  Plan: 1. HgbA1c, fasting lipid panel 2. MRI, MRA  of the brain without contrast 3. PT consult, OT consult, Speech consult 4. Echocardiogram 5. Carotid dopplers 6. Prophylactic therapy-Antiplatelet med: Aspirin 300 mg rectally per day 7. Risk factor modification 8. Telemetry monitoring  C.R. Roseanne RenoStewart, MD [facility] [phone]  01/11/2014, 6:39 PM

## 2014-01-11 NOTE — ED Provider Notes (Signed)
80 year old female was last seen normal at about 6:30 PM yesterday and was brought into the hospital by EMS when family noted that she wasn't talking as well as normal and she was gazing to her right. She does have history of hypertension. She is without complaints currently and specifically denies a headache. On exam, there are no carotid bruits, lungs are clear, heart is regular rate and rhythm, extremities have no cyanosis or edema. Neurologic exam shows a mild left central facial droop and eyes gaze to the right MRI past the midline but she does not completely there are things to her left. Speech is slightly slow but fluent and without dysarthria. She generally does ignore her left side. Left arm strength is 3/5. He she will not actively move her left arm or left leg and I am unable to test her left leg strength. Arm strength is 5/5 and right leg strength is 4/5. No Babinski responses noted. Appearance is that of right parietal lobe stroke. She is outside the window for a code stroke and for thrombolytic therapy as well as other acute interventions because last seen normal time is 21 hours ago. Stroke workup is in progress.old records are reviewed and she has been seen by gastroenterology for chronic constipation and GERD. Cardiac catheterization in 2003 showed normal coronary arteries.   Family has arrived and states that she's been having problems with nausea and vomiting for the last several days. They reiterate the fact that patient was last seen normal in about 6:30 PM last night and was found today with eyes gaze to the right and not speaking. She is under cardiologist care for an irregular heartbeat.  Results for orders placed during the hospital encounter of 01/11/14  ETHANOL      Result Value Range   Alcohol, Ethyl (B) <11  0 - 11 mg/dL  PROTIME-INR      Result Value Range   Prothrombin Time 13.1  11.6 - 15.2 seconds   INR 1.01  0.00 - 1.49  APTT      Result Value Range   aPTT 30  24 - 37  seconds  CBC      Result Value Range   WBC 11.9 (*) 4.0 - 10.5 K/uL   RBC 4.90  3.87 - 5.11 MIL/uL   Hemoglobin 15.0  12.0 - 15.0 g/dL   HCT 16.1  09.6 - 04.5 %   MCV 84.7  78.0 - 100.0 fL   MCH 30.6  26.0 - 34.0 pg   MCHC 36.1 (*) 30.0 - 36.0 g/dL   RDW 40.9  81.1 - 91.4 %   Platelets 321  150 - 400 K/uL  DIFFERENTIAL      Result Value Range   Neutrophils Relative % 74  43 - 77 %   Neutro Abs 8.8 (*) 1.7 - 7.7 K/uL   Lymphocytes Relative 23  12 - 46 %   Lymphs Abs 2.7  0.7 - 4.0 K/uL   Monocytes Relative 4  3 - 12 %   Monocytes Absolute 0.4  0.1 - 1.0 K/uL   Eosinophils Relative 0  0 - 5 %   Eosinophils Absolute 0.0  0.0 - 0.7 K/uL   Basophils Relative 0  0 - 1 %   Basophils Absolute 0.0  0.0 - 0.1 K/uL  COMPREHENSIVE METABOLIC PANEL      Result Value Range   Sodium 114 (*) 137 - 147 mEq/L   Potassium 5.6 (*) 3.7 - 5.3 mEq/L   Chloride  72 (*) 96 - 112 mEq/L   CO2 27  19 - 32 mEq/L   Glucose, Bld 157 (*) 70 - 99 mg/dL   BUN 16  6 - 23 mg/dL   Creatinine, Ser 1.61  0.50 - 1.10 mg/dL   Calcium 09.6  8.4 - 04.5 mg/dL   Total Protein 8.3  6.0 - 8.3 g/dL   Albumin 3.2 (*) 3.5 - 5.2 g/dL   AST 22  0 - 37 U/L   ALT 11  0 - 35 U/L   Alkaline Phosphatase 89  39 - 117 U/L   Total Bilirubin 0.5  0.3 - 1.2 mg/dL   GFR calc non Af Amer 74 (*) >90 mL/min   GFR calc Af Amer 86 (*) >90 mL/min  TROPONIN I      Result Value Range   Troponin I <0.30  <0.30 ng/mL  GLUCOSE, CAPILLARY      Result Value Range   Glucose-Capillary 162 (*) 70 - 99 mg/dL  POCT I-STAT, CHEM 8      Result Value Range   Sodium 115 (*) 137 - 147 mEq/L   Potassium 5.7 (*) 3.7 - 5.3 mEq/L   Chloride 79 (*) 96 - 112 mEq/L   BUN 19  6 - 23 mg/dL   Creatinine, Ser 4.09  0.50 - 1.10 mg/dL   Glucose, Bld 811 (*) 70 - 99 mg/dL   Calcium, Ion 9.14 (*) 1.13 - 1.30 mmol/L   TCO2 33  0 - 100 mmol/L   Hemoglobin 17.3 (*) 12.0 - 15.0 g/dL   HCT 78.2 (*) 95.6 - 21.3 %   Comment NOTIFIED PHYSICIAN    POCT I-STAT  TROPONIN I      Result Value Range   Troponin i, poc 0.00  0.00 - 0.08 ng/mL   Comment 3            Ct Head Wo Contrast  01/11/2014   CLINICAL DATA:  Left-sided weakness with right-sided gaze and slurred speech. Hypertension. Hyperlipidemia.  EXAM: CT HEAD WITHOUT CONTRAST  TECHNIQUE: Contiguous axial images were obtained from the base of the skull through the vertex without intravenous contrast.  COMPARISON:  No comparisons available.  FINDINGS: No intracranial hemorrhage.  Question tiny infarct superior left lenticular nucleus of questionable age.  No CT evidence of large acute infarct.  No intracranial mass lesion noted on this unenhanced exam.  No hydrocephalus.  IMPRESSION: No intracranial hemorrhage.  Question tiny infarct superior left lenticular nucleus of questionable age.  No CT evidence of large acute infarct.  No intracranial mass lesion noted on this unenhanced exam.   Electronically Signed   By: Bridgett Larsson M.D.   On: 01/11/2014 16:10   Sodium has come back severely low, and he is given IV saline.  Images viewed by me.  CRITICAL CARE Performed by: Dione Booze Total critical care time: 40 minutes Critical care time was exclusive of separately billable procedures and treating other patients. Critical care was necessary to treat or prevent imminent or life-threatening deterioration. Critical care was time spent personally by me on the following activities: development of treatment plan with patient and/or surrogate as well as nursing, discussions with consultants, evaluation of patient's response to treatment, examination of patient, obtaining history from patient or surrogate, ordering and performing treatments and interventions, ordering and review of laboratory studies, ordering and review of radiographic studies, pulse oximetry and re-evaluation of patient's condition.  I saw and evaluated the patient, reviewed the resident's note and I agree with  the findings and plan.  ECG shows  normal sinus rhythm with a rate of 78, no ectopy. Normal axis. Normal P wave. Normal QRS. Normal intervals. Normal ST and T waves. Impression: normal ECG. No pericecal available for comparison.    Dione Boozeavid Oaklyn Jakubek, MD 01/11/14 [phone]

## 2014-01-11 NOTE — ED Notes (Signed)
Family at bedside. 

## 2014-01-11 NOTE — ED Notes (Signed)
Took pts CBG it was 162 [facility]

## 2014-01-11 NOTE — ED Notes (Signed)
NOTIFIED DR. Patria ManeAMPOS IN PERSON OF PATIENTS LAB RESULTS OF  I-STAT TROPONIN ,I-STAT CHEM 8+ ,CG4+ LACTIC ACID , 01/11/2014.

## 2014-01-11 NOTE — ED Notes (Signed)
Pt LSN 1830 yesterday 01/10/14. Pt has L sided weakness, right sided gaze, garbled speech. Eyes will not cross midline. CBg 140, 230/140, 170/90, HR 77.

## 2014-01-11 NOTE — H&P (Signed)
PCP:  [name], MD  Was seen in remote past by [facility]  Chief Complaint:  Non-verbal  HPI: Tammy Fernandez is a 80 y.o. female   has a past medical history of Hyperlipidemia; Hypertension; Anxiety; GERD (gastroesophageal reflux disease); Abnormal heart rhythms; Fatty liver; and Internal hemorrhoid.   Presented with  At 2:30pm patient was noted to be not verbal and staring to the right not moving her Left side. Family states she was last seen normal at 10 AM. Patient was brought to ER CT scan negative for bleed. Neurology have seen patietn and she was not a candidate for tPA. Her Na was noted to be 115. Family states she has been drinking lasrge amount of water for some time. K was noted 5.7.  Family states she has had some vertigo for few days but they were attributing this to her sinus problems.  Hsopitalist was called for admission and further work up.  Family states she has irregular heart beat but only taking aspirin 81 mg  Family noted possible blood in urine yesterday but were not certain.   Review of Systems:    Pertinent positives include: nonverbal, left side weakness, orthopnea  Constitutional:  No weight loss, night sweats, Fevers, chills, fatigue, weight loss  HEENT:  No headaches, Difficulty swallowing,Tooth/dental problems,Sore throat,  No sneezing, itching, ear ache, nasal congestion, post nasal drip,  Cardio-vascular:  No chest pain, Orthopnea, PND, anasarca, dizziness, palpitations.no Bilateral lower extremity swelling  GI:  No heartburn, indigestion, abdominal pain, nausea, vomiting, diarrhea, change in bowel habits, loss of appetite, melena, blood in stool, hematemesis Resp:  no shortness of breath at rest. No dyspnea on exertion, No excess mucus, no productive cough, No non-productive cough, No coughing up of blood.No change in color of mucus.No wheezing. Skin:  no rash or lesions. No jaundice GU:  no dysuria, change in color of urine, no urgency or  frequency. No straining to urinate.  No flank pain.  Musculoskeletal:  No joint pain or no joint swelling. No decreased range of motion. No back pain.  Psych:  No change in mood or affect. No depression or anxiety. No memory loss.  Neuro , no tingling, no double vision,   Otherwise ROS are negative except for above, 10 systems were reviewed  Past Medical History: Past Medical History  Diagnosis Date  . Hyperlipidemia   . Hypertension   . Anxiety   . GERD (gastroesophageal reflux disease)   . Abnormal heart rhythms   . Fatty liver   . Internal hemorrhoid    Past Surgical History  Procedure Laterality Date  . Cesarean section    . Abdominal hysterectomy       Medications: Prior to Admission medications   Medication Sig Start Date End Date Taking? Authorizing Provider  aspirin EC 81 MG tablet Take 81 mg by mouth daily.   Yes Historical Provider, MD  ALPRAZolam Prudy Feeler) 0.5 MG tablet Take 0.5 mg by mouth at bedtime as needed.      Historical Provider, MD  Calcium & Magnesium Carbonates (MYLANTA PO) Take by mouth as needed.    Historical Provider, MD  calcium carbonate (TUMS - DOSED IN MG ELEMENTAL CALCIUM) 500 MG chewable tablet Chew 1 tablet by mouth as needed.    Historical Provider, MD  Cimetidine (TAGAMET PO) Take 1 capsule by mouth daily.    Historical Provider, MD  levothyroxine (SYNTHROID, LEVOTHROID) 50 MCG tablet Take 50 mcg by mouth daily.      Historical Provider, MD  meclizine (ANTIVERT) 12.5 MG tablet Take 12.5 mg by mouth 3 (three) times daily as needed.      Historical Provider, MD  propranolol (INDERAL) 60 MG tablet Take 60 mg by mouth 2 (two) times daily.      Historical Provider, MD  Simethicone (GAS-X PO) Take by mouth as needed.    Historical Provider, MD    Allergies:   Allergies  Allergen Reactions  . Crestor [Rosuvastatin Calcium]   . Sulfa Drugs Cross Reactors Rash    Social History:  Ambulatory   independently   Lives a  Home with family    reports that she has never smoked. She has never used smokeless tobacco. She reports that she does not drink alcohol or use illicit drugs.   Family History: family history includes Colon cancer in her cousin and another family member; Diabetes in an other family member; Heart disease in an other family member; Hypertension in her father; Stroke in her father.    Physical Exam: Patient Vitals for the past 24 hrs:  BP Pulse Resp SpO2  01/11/14 1928 197/79 mmHg - 21 100 %  01/11/14 1908 198/89 mmHg 86 19 100 %  01/11/14 1900 198/89 mmHg 85 19 100 %  01/11/14 1845 185/80 mmHg 88 22 100 %  01/11/14 1830 191/81 mmHg 86 24 100 %  01/11/14 1815 175/77 mmHg 85 19 100 %  01/11/14 1800 182/76 mmHg 80 19 100 %  01/11/14 1745 171/79 mmHg 81 28 100 %  01/11/14 1730 167/72 mmHg 80 21 100 %  01/11/14 1715 183/89 mmHg 82 19 100 %  01/11/14 1700 174/76 mmHg 80 21 100 %  01/11/14 1645 190/88 mmHg 84 16 100 %  01/11/14 1630 178/75 mmHg 80 24 100 %  01/11/14 1615 177/81 mmHg 79 23 100 %  01/11/14 1545 189/85 mmHg 80 27 97 %  01/11/14 1538 177/88 mmHg 78 23 98 %    1. General:  in No Acute distress 2. Psychological: Alert but minimally verbal, having trouble recognizing her family 3. Head/ENT:     Dry Mucous Membranes                          Head Non traumatic, neck supple                          Normal  Dentition 4. SKIN: normal   Skin turgor,  Skin clean Dry and intact no rash 5. Heart: Regular rate and rhythm no Murmur, Rub or gallop 6. Lungs: Clear to auscultation bilaterally, no wheezes or crackles   7. Abdomen: Soft, non-tender, Non distended 8. Lower extremities: no clubbing, cyanosis, mild edema 9. Neurologically starring to the right with nystagmus present,  hemiparesis with 2/5 strength of left upper extremity, left neglect  10. MSK: Normal range of motion  body mass index is unknown because there is no weight on file.   Labs on Admission:   Recent Labs  01/11/14 1544  01/11/14 1709  NA 114* 115*  K 5.6* 5.7*  CL 72* 79*  CO2 27  --   GLUCOSE 157* 160*  BUN 16 19  CREATININE 0.75 1.00  CALCIUM 10.0  --     Recent Labs  01/11/14 1544  AST 22  ALT 11  ALKPHOS 89  BILITOT 0.5  PROT 8.3  ALBUMIN 3.2*   No results found for this basename: LIPASE, AMYLASE,  in the last 72  hours  Recent Labs  01/11/14 1544 01/11/14 1709  WBC 11.9*  --   NEUTROABS 8.8*  --   HGB 15.0 17.3*  HCT 41.5 51.0*  MCV 84.7  --   PLT 321  --     Recent Labs  01/11/14 1544  TROPONINI <0.30   No results found for this basename: TSH, T4TOTAL, FREET3, T3FREE, THYROIDAB,  in the last 72 hours No results found for this basename: VITAMINB12, FOLATE, FERRITIN, TIBC, IRON, RETICCTPCT,  in the last 72 hours No results found for this basename: HGBA1C    The CrCl is unknown because both a height and weight (above a minimum accepted value) are required for this calculation. ABG    Component Value Date/Time   TCO2 33 01/11/2014 1709     No results found for this basename: DDIMER     Other results:  I have pearsonaly reviewed this: ECG REPORT  Rate: 76  Rhythm: SR ST&T Change: no ischemia   Cultures: No results found for this basename: sdes, specrequest, cult, reptstatus       Radiological Exams on Admission: Ct Head Wo Contrast  01/11/2014   CLINICAL DATA:  Left-sided weakness with right-sided gaze and slurred speech. Hypertension. Hyperlipidemia.  EXAM: CT HEAD WITHOUT CONTRAST  TECHNIQUE: Contiguous axial images were obtained from the base of the skull through the vertex without intravenous contrast.  COMPARISON:  No comparisons available.  FINDINGS: No intracranial hemorrhage.  Question tiny infarct superior left lenticular nucleus of questionable age.  No CT evidence of large acute infarct.  No intracranial mass lesion noted on this unenhanced exam.  No hydrocephalus.  IMPRESSION: No intracranial hemorrhage.  Question tiny infarct superior left lenticular  nucleus of questionable age.  No CT evidence of large acute infarct.  No intracranial mass lesion noted on this unenhanced exam.   Electronically Signed   By: Bridgett Larsson M.D.   On: 01/11/2014 16:10    Chart has been reviewed  Assessment/Plan  80 yo F with likely R MCA CVA in the setting of hyponatremia and UTI  Present on Admission:  . Acute ischemic right MCA stroke per exam - will admit based on CVA protocol, await results of MRA/MRI, Carotid Doppler and Echo, Lipid panel, TSH. Order PT/OT speech evaluation. Will make sure patient is on antiplatelet agent.  Neurology consult has been called.    Given hyponatremia it is remotely possible that her symptoms are due to hyponatremia. Discussed with nephrology and neurology. For now will await results of MRI if rapid improvement with resolution of hyponatremia and atypical findings on MRI then Hyponatremia causing neurological changes is more likely.  . Hypertension - while NPO, metoprolol IV with holding parameters to prevent rebound tachycardia . Hyponatremia - Spoke to Nephrology who will see in AM. Given slight increase in cr and evidence of hemoconcentration will give NS at 75 ml/h and watch Na carefully, she may need a combination of fluid restriction given polydipsia, obtain urine electrolytes . Hyperkalemia - kayexalate, urine K UTI - rocephin, urine culture pending, likely explains hematuria but if does not resolve will need urology follow up.   Prophylaxis: SCD  , Protonix  CODE STATUS: FULL CODE  Other plan as per orders.  I have spent a total of 70 min on this admission, extra time taken to discuss case with neurology and nephrology  Dashiel Bergquist 01/11/2014, 7:58 PM

## 2014-01-12 ENCOUNTER — Inpatient Hospital Stay (HOSPITAL_COMMUNITY): Payer: PRIVATE HEALTH INSURANCE

## 2014-01-12 DIAGNOSIS — N390 Urinary tract infection, site not specified: Secondary | ICD-10-CM

## 2014-01-12 DIAGNOSIS — G9340 Encephalopathy, unspecified: Secondary | ICD-10-CM | POA: Diagnosis present

## 2014-01-12 DIAGNOSIS — G8190 Hemiplegia, unspecified affecting unspecified side: Secondary | ICD-10-CM

## 2014-01-12 DIAGNOSIS — G8194 Hemiplegia, unspecified affecting left nondominant side: Secondary | ICD-10-CM | POA: Diagnosis present

## 2014-01-12 LAB — BASIC METABOLIC PANEL
BUN: 18 mg/dL (ref 6–23)
BUN: 17 mg/dL (ref 6–23)
CALCIUM: 9.4 mg/dL (ref 8.4–10.5)
CHLORIDE: 83 meq/L — AB (ref 96–112)
CO2: 30 meq/L (ref 19–32)
CO2: 30 mEq/L (ref 19–32)
CREATININE: 0.84 mg/dL (ref 0.50–1.10)
Calcium: 9.8 mg/dL (ref 8.4–10.5)
Chloride: 83 mEq/L — ABNORMAL LOW (ref 96–112)
Creatinine, Ser: 0.78 mg/dL (ref 0.50–1.10)
GFR calc Af Amer: 70 mL/min — ABNORMAL LOW (ref >90)
GFR calc non Af Amer: 61 mL/min — ABNORMAL LOW (ref >90)
GFR calc non Af Amer: 73 mL/min — ABNORMAL LOW (ref >90)
GFR, EST AFRICAN AMERICAN: 85 mL/min — AB (ref >90)
GLUCOSE: 154 mg/dL — AB (ref 70–99)
Glucose, Bld: 148 mg/dL — ABNORMAL HIGH (ref 70–99)
Potassium: 4.8 mEq/L (ref 3.7–5.3)
Potassium: 4.5 mEq/L (ref 3.7–5.3)
SODIUM: 125 meq/L — AB (ref 137–147)
Sodium: 127 mEq/L — ABNORMAL LOW (ref 137–147)

## 2014-01-12 LAB — LIPID PANEL
Cholesterol: 165 mg/dL (ref 0–200)
HDL: 38 mg/dL — AB (ref >39)
LDL Cholesterol: 111 mg/dL — ABNORMAL HIGH (ref 0–99)
Total CHOL/HDL Ratio: 4.3 RATIO
Triglycerides: 79 mg/dL (ref <150)
VLDL: 16 mg/dL (ref 0–40)

## 2014-01-12 LAB — BASIC METABOLIC PANEL WITH GFR
BUN: 18 mg/dL (ref 6–23)
BUN: 23 mg/dL (ref 6–23)
CO2: 28 meq/L (ref 19–32)
CO2: 28 meq/L (ref 19–32)
Calcium: 9.4 mg/dL (ref 8.4–10.5)
Calcium: 9.5 mg/dL (ref 8.4–10.5)
Chloride: 83 meq/L — ABNORMAL LOW (ref 96–112)
Chloride: 82 meq/L — ABNORMAL LOW (ref 96–112)
Creatinine, Ser: 0.83 mg/dL (ref 0.50–1.10)
Creatinine, Ser: 0.96 mg/dL (ref 0.50–1.10)
GFR calc Af Amer: 71 mL/min — ABNORMAL LOW
GFR calc Af Amer: 60 mL/min — ABNORMAL LOW
GFR calc non Af Amer: 62 mL/min — ABNORMAL LOW
GFR calc non Af Amer: 52 mL/min — ABNORMAL LOW
Glucose, Bld: 142 mg/dL — ABNORMAL HIGH (ref 70–99)
Glucose, Bld: 159 mg/dL — ABNORMAL HIGH (ref 70–99)
Potassium: 4.7 meq/L (ref 3.7–5.3)
Potassium: 4.4 meq/L (ref 3.7–5.3)
Sodium: 124 meq/L — ABNORMAL LOW (ref 137–147)
Sodium: 123 meq/L — ABNORMAL LOW (ref 137–147)

## 2014-01-12 LAB — GLUCOSE, CAPILLARY: Glucose-Capillary: 150 mg/dL — ABNORMAL HIGH (ref 70–99)

## 2014-01-12 LAB — NA AND K (SODIUM & POTASSIUM), RAND UR: Potassium Urine: 54 mEq/L

## 2014-01-12 LAB — OSMOLALITY, URINE: Osmolality, Ur: 556 mOsm/kg (ref 390–1090)

## 2014-01-12 LAB — HEMOGLOBIN A1C
HEMOGLOBIN A1C: 6.6 % — AB (ref <5.7)
Mean Plasma Glucose: 143 mg/dL — ABNORMAL HIGH (ref <117)

## 2014-01-12 LAB — MRSA PCR SCREENING: MRSA by PCR: NEGATIVE

## 2014-01-12 LAB — TSH: TSH: 0.187 u[IU]/mL — ABNORMAL LOW (ref 0.350–4.500)

## 2014-01-12 LAB — CREATININE, URINE, RANDOM: Creatinine, Urine: 154.68 mg/dL

## 2014-01-12 LAB — INFLUENZA PANEL BY PCR (TYPE A & B)
H1N1 flu by pcr: NOT DETECTED
Influenza A By PCR: NEGATIVE
Influenza B By PCR: NEGATIVE

## 2014-01-12 MED ORDER — LEVOTHYROXINE SODIUM 100 MCG IV SOLR
25.0000 ug | Freq: Every day | INTRAVENOUS | Status: DC
  Administered 2014-01-12 – 2014-01-15 (×4): 25 ug via INTRAVENOUS
  Filled 2014-01-12 (×4): qty 5

## 2014-01-12 MED ORDER — DEXTROSE 50 % IV SOLN
1.0000 | Freq: Once | INTRAVENOUS | Status: AC
  Administered 2014-01-12: 50 mL via INTRAVENOUS
  Filled 2014-01-12: qty 50

## 2014-01-12 MED ORDER — KETOROLAC TROMETHAMINE 15 MG/ML IJ SOLN
15.0000 mg | Freq: Once | INTRAMUSCULAR | Status: AC
  Administered 2014-01-12: 15 mg via INTRAVENOUS
  Filled 2014-01-12: qty 1

## 2014-01-12 MED ORDER — INSULIN ASPART 100 UNIT/ML ~~LOC~~ SOLN
10.0000 [IU] | Freq: Once | SUBCUTANEOUS | Status: AC
  Administered 2014-01-12: 10 [IU] via SUBCUTANEOUS

## 2014-01-12 MED ORDER — SODIUM CHLORIDE 0.45 % IV SOLN
INTRAVENOUS | Status: DC
  Administered 2014-01-12: 20:00:00 via INTRAVENOUS

## 2014-01-12 NOTE — Evaluation (Signed)
Speech Language Pathology Evaluation Patient Details Name: Tammy Fernandez MRN: [id number] DOB: 12-Feb-1924 Today's Date: 01/12/2014 Time: [id number] SLP Time Calculation (min): 10 min  Problem List:  Patient Active Problem List   Diagnosis Date Noted  . Acute ischemic right MCA stroke 01/11/2014  . Hypertension 01/11/2014  . Hyponatremia 01/11/2014  . Hyperkalemia 01/11/2014  . Hematuria 01/11/2014  . CVA (cerebral infarction) 01/11/2014  . UTI (urinary tract infection) 01/11/2014  . GERD (gastroesophageal reflux disease) 11/14/2011  . Obesities, morbid 11/14/2011   Past Medical History:  Past Medical History  Diagnosis Date  . Hyperlipidemia   . Hypertension   . Anxiety   . GERD (gastroesophageal reflux disease)   . Abnormal heart rhythms   . Fatty liver   . Internal hemorrhoid    Past Surgical History:  Past Surgical History  Procedure Laterality Date  . Cesarean section    . Abdominal hysterectomy     HPI:  80 y.o. female with a history of hypertension and hyperlipidemia who developed acute onset of inability to speak, gaze to the right and paralysis of left extremities  .  Head CT negative for acute infarct;  Symptoms consistent with substantial R MCA CVA.  Neuro following.   Assessment / Plan / Recommendation Clinical Impression  Pt presents with significant cognitive deficits with impaired attention and initiation, total left neglect and right gaze deviation, difficulty following commands and making basic needs known.  Minimal speech output - answers basic personal questions; tends to perseverate on "Mmm hmmm."  Family present.  Pt was independent with ADLs, cookded occasionally, and had fairly good memory/cognition PTA.  Discussed results and recs for acute f/u with daughters; rec CIR consult.    SLP Assessment  Patient needs continued Speech Lanaguage Pathology Services    Follow Up Recommendations  Inpatient Rehab    Frequency and Duration min 3x week  2  weeks      SLP Goals  SLP Goals Potential to Achieve Goals: Fair Progress/Goals/Alternative treatment plan discussed with pt/caregiver and they: Agree  SLP Evaluation Prior Functioning  Cognitive/Linguistic Baseline: Within functional limits  Lives With: Family Available Help at Discharge: Family   Cognition  Overall Cognitive Status: Impaired/Different from baseline Arousal/Alertness: Awake/alert Orientation Level: Oriented to person;Disoriented to place;Disoriented to time;Disoriented to situation Attention: Focused Focused Attention: Impaired Focused Attention Impairment: Verbal basic;Functional basic Memory: Impaired Memory Impairment: Storage deficit;Retrieval deficit Awareness: Impaired Awareness Impairment: Intellectual impairment Problem Solving: Impaired Problem Solving Impairment: Verbal basic;Functional basic Behaviors: Perseveration Safety/Judgment: Impaired    Comprehension  Auditory Comprehension Overall Auditory Comprehension:  (tba) Visual Recognition/Discrimination Discrimination: Not tested Reading Comprehension Reading Status: Not tested    Expression Expression Primary Mode of Expression: Verbal Verbal Expression Overall Verbal Expression: Impaired Initiation: Impaired   Oral / Motor Oral Motor/Sensory Function Overall Oral Motor/Sensory Function: Impaired   Kashif Pooler L. Samson Fredericouture, [place] CCC/SLP Pager [phone]      Blenda MountsCouture, Thom Ollinger Laurice 01/12/2014, 2:27 PM

## 2014-01-12 NOTE — Progress Notes (Signed)
EEG completed; results pending.    

## 2014-01-12 NOTE — Progress Notes (Signed)
Subjective: Patient has continued to have deviation of her eyes to the right as well as apparent right facial weakness and left hemiplegia. She is still alert however in response to those around her. MRI did not show signs consistent with large acute MCA territory infarction, despite her clinical findings.  Objective: Current vital signs: BP 161/64  Pulse 102  Temp(Src) 99.5 F (37.5 C) (Oral)  Resp 17  Ht 5\' 2"  (1.575 m)  Wt 86.6 kg (190 lb 14.7 oz)  BMI 34.91 kg/m2  SpO2 96%  Neurologic Exam: Patient was alert and in no acute distress. Eyes were deviated conjugately to the right with no movement toward the left. She was able to follow commands with use of right extremities with no difficulty. She also attempted to respond verbally but speech output was garbled . Face was asymmetric with what appeared to be marked left lower facial weakness. She had normal strength of right extremities with normal voluntary movement. Flaccid paralysis of left upper and lower extremities has persisted.   Medications: I have reviewed the patient's current medications.  EEG study shows moderate generalized slowing of cerebral activity. There also instances of possible rhythmic high amplitude delta activity involving the frontal regions. Study will need to be evaluated for second opinion as to significance of frontal lobe findings. No clear frank seizure activity was identified, but cannot be completely ruled out, as well. Final report has been deferred.   Assessment/Plan: Encephalopathic state of unclear etiology. Patient has areas of patchy abnormality involving cortical as well as basal ganglia regions on diffusion weighted studies and to a lesser extent on T2 flair. Significance is unclear. Patient had no obvious pulvinar or hockey-stick sign involving thalami.  Further evaluation with lumbar puncture may be indicated.  No anticonvulsant medication is indicated at this point.  Recommend continued  supportive care and step down unit setting for now. We will continue with close Neurohospitalist involvement in this patient's case.   C.R. Roseanne RenoStewart, MD [facility] [phone]   01/12/2014  6:22 PM

## 2014-01-12 NOTE — Progress Notes (Signed)
Utilization review completed.  

## 2014-01-12 NOTE — Consult Note (Signed)
Reason for Consult: hyponatremia Referring Physician: Mahitha Fernandez is an 80 y.o. female with PMhx significant for HTN, hyperlipidemia, GERD, hypothyroidism , arrythmias- details not known and anxiety.  She was brought to the ED yesterday at 2:30 PM non verbal and staring- imaging not conclusive but neurology thinking is consistent with a large right MCA territory infarct.  In addition she was noted to have a serum sodium of 114 with a story that she had been drinking a lot of water. Patient was given IVF of NS and that was the only intervention- sodium improved fairly rapidly from 119 to 127 in about 8 hours, next check was 125.  Of note, she had a sodium in September which was on the low side at 130. In addition, family tells me that her Md had noted low sodum int he past. Work up showed an elevated urinary osm in the 500's  and a decreased urinary sodium indicating volume depletion and possible ? SIADH.  U/A shows possible UTI- placed on rocephin.  Potassium was originally high but that has resolved- uop since admit is 1500 cc.  Family thinks she has improved from admission.  Was previously healthy and active but now one word responses and left neglect    Trend in Creatinine: Creatinine, Ser  Date/Time Value Range Status  01/12/2014  9:40 AM 0.78  0.50 - 1.10 mg/dL Final  1/61/0960  4:54 AM 0.84  0.50 - 1.10 mg/dL Final  0/08/8118  1:47 PM 0.70  0.50 - 1.10 mg/dL Final  07/04/9561  1:30 PM 1.00  0.50 - 1.10 mg/dL Final  07/09/5783  6:96 PM 0.75  0.50 - 1.10 mg/dL Final  2/95/2841 32:44 AM 0.63  0.50 - 1.10 mg/dL Final   Sodium  Date/Time Value Range Status  01/12/2014  9:40 AM 125* 137 - 147 mEq/L Final  01/12/2014  6:06 AM 127* 137 - 147 mEq/L Final  01/11/2014  9:52 PM 119* 137 - 147 mEq/L Final     CRITICAL RESULT CALLED TO, READ BACK BY AND VERIFIED WITH:     IRBY T,RN 01/11/14 2232 WAYK  01/11/2014  5:09 PM 115* 137 - 147 mEq/L Final  01/11/2014  3:44 PM 114* 137 - 147 mEq/L Final      CRITICAL RESULT CALLED TO, READ BACK BY AND VERIFIED WITH:     JESSICA MUMFORD,RN 01/11/14 1741 SHIPMAN M  08/27/2013 10:49 AM 130* 135 - 145 mEq/L Final   PMH:   Past Medical History  Diagnosis Date  . Hyperlipidemia   . Hypertension   . Anxiety   . GERD (gastroesophageal reflux disease)   . Abnormal heart rhythms   . Fatty liver   . Internal hemorrhoid     PSH:   Past Surgical History  Procedure Laterality Date  . Cesarean section    . Abdominal hysterectomy      Allergies:  Allergies  Allergen Reactions  . Crestor [Rosuvastatin Calcium]   . Sulfa Drugs Cross Reactors Rash    Medications:   Prior to Admission medications   Medication Sig Start Date End Date Taking? Authorizing Provider  aspirin EC 81 MG tablet Take 81 mg by mouth daily.   Yes Historical Provider, MD  ALPRAZolam Prudy Feeler) 0.5 MG tablet Take 0.5 mg by mouth at bedtime as needed.      Historical Provider, MD  Calcium & Magnesium Carbonates (MYLANTA PO) Take by mouth as needed.    Historical Provider, MD  calcium carbonate (TUMS - DOSED IN MG  ELEMENTAL CALCIUM) 500 MG chewable tablet Chew 1 tablet by mouth as needed.    Historical Provider, MD  Cimetidine (TAGAMET PO) Take 1 capsule by mouth daily.    Historical Provider, MD  levothyroxine (SYNTHROID, LEVOTHROID) 50 MCG tablet Take 50 mcg by mouth daily.      Historical Provider, MD  meclizine (ANTIVERT) 12.5 MG tablet Take 12.5 mg by mouth 3 (three) times daily as needed.      Historical Provider, MD  propranolol (INDERAL) 60 MG tablet Take 60 mg by mouth 2 (two) times daily.      Historical Provider, MD  Simethicone (GAS-X PO) Take by mouth as needed.    Historical Provider, MD    Discontinued Meds:   Medications Discontinued During This Encounter  Medication Reason  . labetalol (NORMODYNE,TRANDATE) injection 10 mg     Social History:  reports that she has never smoked. She has never used smokeless tobacco. She reports that she does not drink alcohol or  use illicit drugs.  Family History:   Family History  Problem Relation Age of Onset  . Stroke Father   . Hypertension Father   . Colon cancer Cousin   . Colon cancer      Aunt  . Diabetes      aunt and cousins  . Heart disease      cousins    Review of systems not obtained due to patient factors.  Blood pressure 161/64, pulse 102, temperature 102.7 F (39.3 C), temperature source Oral, resp. rate 17, height 5\' 2"  (1.575 m), weight 86.6 kg (190 lb 14.7 oz), SpO2 96.00%. General appearance: distracted and moderate distress Eyes: conjunctivae/corneas clear. PERRL, EOM's intact. Fundi benign., right gaze Resp: diminished breath sounds bibasilar Cardio: regular rate and rhythm, S1, S2 normal, no murmur, click, rub or gallop GI: soft, non-tender; bowel sounds normal; no masses,  no organomegaly Extremities: extremities normal, atraumatic, no cyanosis or edema Skin: Skin color, texture, turgor normal. No rashes or lesions Neurologic: Mental status: Alert, oriented, thought content appropriate, one word answers, right gaze, left neglect   Labs: Basic Metabolic Panel:  Recent Labs Lab 01/11/14 1544 01/11/14 1709 01/11/14 2152 01/12/14 0606 01/12/14 0940  NA 114* 115* 119* 127* 125*  K 5.6* 5.7* 6.4* 4.8 4.5  CL 72* 79* 77* 83* 83*  CO2 27  --  27 30 30   GLUCOSE 157* 160* 150* 148* 154*  BUN 16 19 17 18 17   CREATININE 0.75 1.00 0.70 0.84 0.78  ALBUMIN 3.2*  --   --   --   --   CALCIUM 10.0  --  9.5 9.8 9.4   Liver Function Tests:  Recent Labs Lab 01/11/14 1544  AST 22  ALT 11  ALKPHOS 89  BILITOT 0.5  PROT 8.3  ALBUMIN 3.2*   No results found for this basename: LIPASE, AMYLASE,  in the last 168 hours No results found for this basename: AMMONIA,  in the last 168 hours CBC:  Recent Labs Lab 01/11/14 1544 01/11/14 1709  WBC 11.9*  --   NEUTROABS 8.8*  --   HGB 15.0 17.3*  HCT 41.5 51.0*  MCV 84.7  --   PLT 321  --    PT/INR: @labrcntip (inr:5) Cardiac  Enzymes:  Recent Labs Lab 01/11/14 1544  TROPONINI <0.30   CBG:  Recent Labs Lab 01/11/14 1545 01/12/14 1233  GLUCAP 162* 150*    Iron Studies: No results found for this basename: IRON, TIBC, TRANSFERRIN, FERRITIN,  in the last 168  hours  Xrays/Other Studies: Ct Head Wo Contrast  01/11/2014   CLINICAL DATA:  Left-sided weakness with right-sided gaze and slurred speech. Hypertension. Hyperlipidemia.  EXAM: CT HEAD WITHOUT CONTRAST  TECHNIQUE: Contiguous axial images were obtained from the base of the skull through the vertex without intravenous contrast.  COMPARISON:  No comparisons available.  FINDINGS: No intracranial hemorrhage.  Question tiny infarct superior left lenticular nucleus of questionable age.  No CT evidence of large acute infarct.  No intracranial mass lesion noted on this unenhanced exam.  No hydrocephalus.  IMPRESSION: No intracranial hemorrhage.  Question tiny infarct superior left lenticular nucleus of questionable age.  No CT evidence of large acute infarct.  No intracranial mass lesion noted on this unenhanced exam.   Electronically Signed   By: Bridgett Larsson M.D.   On: 01/11/2014 16:10   Mr Angiogram Head Wo Contrast  01/11/2014   CLINICAL DATA:  Acute onset of inability to speak, gaze to the right and left paralysis. History of hypertension and hyperlipidemia.  EXAM: MRI HEAD WITHOUT CONTRAST  TECHNIQUE: Multiplanar, multiecho pulse sequences of the brain and surrounding structures were obtained without intravenous contrast. 3D time-of-flight MRA through the circle of Willis with maximum intensity projected reformations.  COMPARISON:  CT of the head January 11, 2014 at 1602 hr  FINDINGS: MRI head: Mild motion degraded examination. No reduced diffusion to suggest acute ischemia. No susceptibility artifact to suggest hemorrhage.  The ventricles and sulci are normal for patient's age. Scattered subcentimeter supratentorial white matter T2 hyperintensities are less than  expected for age without midline shift or mass effect. Subtle asymmetric T2 hyperintense signal within the right temporal cortex, axial 8/21, apparent on the axial FLAIR sequence as well though, less conspicuous on the coronal T2.  No abnormal extra-axial fluid collections. Basal cisterns are patent.  Status post bilateral ocular lens implants. Paranasal sinuses and mastoid air cells appear well-aerated. No abnormal sellar expansion, cerebellar tonsils descend less than 5 mm below the foramen magnum, and do not appear pointed in appearance.  MRA head: Normal flow related enhancement of the cervical internal carotid arteries, with slight signal loss at a tortuous segment of the right cervical internal carotid artery, consistent with anatomical variant. Normal flow related enhancement of the petrous internal carotid arteries. There is a long segment of narrowing of the right cavernous internal carotid artery, less than 50% with normal flow related enhancement supra clinoid internal carotid arteries. The left A1 segment is congenitally dominant, with predominately azygos anterior cerebral artery. The right middle cerebral artery is generally smaller than the left, this may be in part accentuated by tilt within the scanner. There is poor flow early enhancement of the right M3 branches, best seen on the reformations.  Nearly codominant vertebral arteries with normal flow of enhanced in the vertebrobasilar system and main branch vessels. Normal flow related enhancement of posterior cerebral arteries.  No large vessel occlusion, aneurysm, suspicious luminal irregularity within the anterior or posterior circulation. Very mild diffuse irregularity of the intracranial vessels suggests atherosclerosis.  IMPRESSION: MRI of the head: No convincing evidence of reduced diffusion to suggest acute ischemia, however there is mild bright signal within the right temporal cortex, in an area predominately obscured on the  diffusion-weighted sequences and, early stroke could have this appearance.  Otherwise unremarkable MRI of the head for age.  MRA of the head: Poor enhancement of right M3 branches concerning for high-grade stenosis/ possible embolus.  Narrowing of the right carotid siphon without hemodynamically significant  stenosis through the circle Willis.  Critical Value/emergent results were called by telephone at the time of interpretation on 01/11/2014 at 10:31 PM to Dr. Dione BoozeAVID GLICK , who verbally acknowledged these results.   Electronically Signed   By: Awilda Metroourtnay  Bloomer   On: 01/11/2014 22:33   Mr Brain Wo Contrast  01/11/2014   CLINICAL DATA:  Acute onset of inability to speak, gaze to the right and left paralysis. History of hypertension and hyperlipidemia.  EXAM: MRI HEAD WITHOUT CONTRAST  TECHNIQUE: Multiplanar, multiecho pulse sequences of the brain and surrounding structures were obtained without intravenous contrast. 3D time-of-flight MRA through the circle of Willis with maximum intensity projected reformations.  COMPARISON:  CT of the head January 11, 2014 at 1602 hr  FINDINGS: MRI head: Mild motion degraded examination. No reduced diffusion to suggest acute ischemia. No susceptibility artifact to suggest hemorrhage.  The ventricles and sulci are normal for patient's age. Scattered subcentimeter supratentorial white matter T2 hyperintensities are less than expected for age without midline shift or mass effect. Subtle asymmetric T2 hyperintense signal within the right temporal cortex, axial 8/21, apparent on the axial FLAIR sequence as well though, less conspicuous on the coronal T2.  No abnormal extra-axial fluid collections. Basal cisterns are patent.  Status post bilateral ocular lens implants. Paranasal sinuses and mastoid air cells appear well-aerated. No abnormal sellar expansion, cerebellar tonsils descend less than 5 mm below the foramen magnum, and do not appear pointed in appearance.  MRA head: Normal flow  related enhancement of the cervical internal carotid arteries, with slight signal loss at a tortuous segment of the right cervical internal carotid artery, consistent with anatomical variant. Normal flow related enhancement of the petrous internal carotid arteries. There is a long segment of narrowing of the right cavernous internal carotid artery, less than 50% with normal flow related enhancement supra clinoid internal carotid arteries. The left A1 segment is congenitally dominant, with predominately azygos anterior cerebral artery. The right middle cerebral artery is generally smaller than the left, this may be in part accentuated by tilt within the scanner. There is poor flow early enhancement of the right M3 branches, best seen on the reformations.  Nearly codominant vertebral arteries with normal flow of enhanced in the vertebrobasilar system and main branch vessels. Normal flow related enhancement of posterior cerebral arteries.  No large vessel occlusion, aneurysm, suspicious luminal irregularity within the anterior or posterior circulation. Very mild diffuse irregularity of the intracranial vessels suggests atherosclerosis.  IMPRESSION: MRI of the head: No convincing evidence of reduced diffusion to suggest acute ischemia, however there is mild bright signal within the right temporal cortex, in an area predominately obscured on the diffusion-weighted sequences and, early stroke could have this appearance.  Otherwise unremarkable MRI of the head for age.  MRA of the head: Poor enhancement of right M3 branches concerning for high-grade stenosis/ possible embolus.  Narrowing of the right carotid siphon without hemodynamically significant stenosis through the circle Willis.  Critical Value/emergent results were called by telephone at the time of interpretation on 01/11/2014 at 10:31 PM to Dr. Dione BoozeAVID GLICK , who verbally acknowledged these results.   Electronically Signed   By: Awilda Metroourtnay  Bloomer   On: 01/11/2014  22:33   Dg Chest Port 1 View  01/12/2014   CLINICAL DATA:  Fever.  EXAM: PORTABLE CHEST - 1 VIEW  COMPARISON:  NM MYOCAR MULTI w/SPECT w/WALL MOTION / EF dated 08/27/2013  FINDINGS: Patient is rotated to the right. Chronic elevation of the right  hemidiaphragm opacity at the right chest. Tortuous thoracic aorta appears similar. Lung volumes are lower than on prior studies. Monitoring leads project over the chest. There is no gross focal consolidation identified. Cardiomediastinal contours appear similar allowing for rotation.  IMPRESSION: Lower lung volumes with chronic elevation of the right hemidiaphragm. No gross acute cardiopulmonary disease.   Electronically Signed   By: Andreas Newport M.D.   On: 01/12/2014 01:27     Assessment/Plan: 80 year old BF with baseline hyponatremia at 130- presenting with CVA- and sodium of 114- rapidly corrected with NS  1. Hyponatremia- seems like she has some baseline hyponatremia- possibly an element of SIADH- will check a random cortisol and TSH given hyperkalemia and known history of hypothyroid.  Her initial urine studies possibly consistent with volume depletion plus or minus SIADH.  Sodium improved rapidly with NS only (or was it the absence of her excessive water intake ?)- for now would continue NS since really unable to take PO's- if sodium starts trending down again then would be more consistent with SIADH.  Will likely check urine osm again later in hosp when she is in a more steady state to determine if extra treatment may be needed for SIADH 2. Fever- UTI on rocephin- also wonder about possible aspiration given deficits- await urine culture.   3. Neuro deficits- seeming consistant with CVA- work up in progress- if it were due to her sodium, I would expect her to be more improved at this time.   Thank you for this consult, I will follow with you   Chinmay Squier A 01/12/2014, 1:36 PM

## 2014-01-12 NOTE — Progress Notes (Signed)
Rehab Admissions Coordinator Note:  Patient was screened by Koda Routon L for appropriateness for an Inpatient Acute Rehab Consult.  At this time, we are recommending Inpatient Rehab consult.  Shahzain Kiester, PT Rehabilitation Admissions Coordinator [phone]  

## 2014-01-12 NOTE — Clinical Social Work Psychosocial (Signed)
Clinical Social Work Department BRIEF PSYCHOSOCIAL ASSESSMENT 01/12/2014  Patient:  Tammy Fernandez, Tammy Fernandez     Account Number:  [id number]     Admit date:  01/11/2014  Clinical Social Worker:  Lovey Newcomer  Date/Time:  01/12/2014 04:00 PM  Referred by:  Physician  Date Referred:  01/12/2014 Referred for  SNF Placement   Other Referral:   Interview type:  Family Other interview type:   Family interviewed at bedside as patient is not oriented at this time.    PSYCHOSOCIAL DATA Living Status:  FAMILY Admitted from facility:   Level of care:   Primary support name:  Tammy Fernandez and Tammy Fernandez Primary support relationship to patient:  CHILD, ADULT Degree of support available:   Support is adequate. Many family members at bedside.    CURRENT CONCERNS Current Concerns  Post-Acute Placement   Other Concerns:    SOCIAL WORK ASSESSMENT / PLAN CSW met with family at patient's bedside. Patient's daughter Tammy Fernandez was main person engaged in assessment. Tammy Fernandez states that patient is from home with Tammy Fernandez's brother and sister (patient's children). CSW explained CIR recommendation by PT and the need for SNF backup. Tammy Fernandez is agreeable to patient going to SNF if patient is not admitted to CIR. Tammy Fernandez does not have a SNF preference at this time and states that patient has not been in SNF before. Tammy Fernandez plans for patient to return to previous licing environment when patient is ready. CSW will follow for DC needs.   Assessment/plan status:  Psychosocial Support/Ongoing Assessment of Needs Other assessment/ plan:   Complete FL2, Fax, PASRR   Information/referral to community resources:   CSW contact information and SNF list given to Tammy Fernandez.    PATIENT'S/FAMILY'S RESPONSE TO PLAN OF CARE: Tammy Fernandez and other family members are agreeable to a SNF backup plan to CIR. Family was pleasant, appropriate, and appreciative of CSW contact. Tammy Fernandez states that she and the family are very overwhelmed by  the hospitalization of the patient and all the new information. CSW offered emotional support and offered to be available to family if they need to speak with CSW during hospitaliztion. CSW will follow.       Tammy Fernandez, [place], [place], [phone]

## 2014-01-12 NOTE — Progress Notes (Signed)
Bilateral carotid artery duplex:  1-39% ICA stenosis.  Vertebral artery flow is antegrade.  ECA not insonated.  Technically difficult study due to the patient's body habitus and position.

## 2014-01-12 NOTE — Evaluation (Signed)
Physical Therapy Evaluation Patient Details Name: Tammy Fernandez MRN: [id number] DOB: 12/31/1923 Today's Date: 01/12/2014 Time: [id number] PT Time Calculation (min): 35 min  PT Assessment / Plan / Recommendation History of Present Illness  Tammy Fernandez is an 80 y.o. female with a history of hypertension and hyperlipidemia who developed acute onset of inability to speak, gaze to the right and paralysis of left extremities earlier today. Patient was last seen well at 9:30 this morning. She has no previous history of stroke nor TIA. She's been taking aspirin daily. CT scan of her head showed no acute intracranial abnormality. NIH stroke score was 22.  Clinical Impression  Pt with L hemiplegia and L sided neglect. Pt mod indep PTA. Pt to strongly benefit from CIR upon d/c to achieve maximal level of function to return home with family to provide 24/7 supervision/assist.    PT Assessment  Patient needs continued PT services    Follow Up Recommendations  CIR    Does the patient have the potential to tolerate intense rehabilitation      Barriers to Discharge        Equipment Recommendations  None recommended by PT    Recommendations for Other Services Rehab consult   Frequency Min 4X/week    Precautions / Restrictions Precautions Precautions: Fall Precaution Comments: L sided neglect Restrictions Weight Bearing Restrictions: No   Pertinent Vitals/Pain Didn't report      Mobility  Bed Mobility Overal bed mobility: Needs Assistance;+2 for physical assistance Bed Mobility: Rolling;Supine to Sit Rolling: Total assist;+2 for physical assistance Supine to sit: Total assist;+2 for physical assistance General bed mobility comments: pt with no initiation of task Modified Rankin (Stroke Patients Only) Pre-Morbid Rankin Score: No significant disability Modified Rankin: Severe disability    Exercises     PT Diagnosis: Difficulty walking;Hemiplegia non-dominant side  PT Problem  List: Decreased strength;Decreased activity tolerance;Decreased balance;Decreased mobility;Decreased coordination;Decreased cognition;Decreased safety awareness PT Treatment Interventions: DME instruction;Gait training;Functional mobility training;Therapeutic activities;Therapeutic exercise;Balance training;Neuromuscular re-education     PT Goals(Current goals can be found in the care plan section) Acute Rehab PT Goals PT Goal Formulation: With patient/family Time For Goal Achievement: 01/26/14 Potential to Achieve Goals: Good  Visit Information  Last PT Received On: 01/12/14 Assistance Needed: +2 History of Present Illness: Tammy Fernandez is an 80 y.o. female with a history of hypertension and hyperlipidemia who developed acute onset of inability to speak, gaze to the right and paralysis of left extremities earlier today. Patient was last seen well at 9:30 this morning. She has no previous history of stroke nor TIA. She's been taking aspirin daily. CT scan of her head showed no acute intracranial abnormality. NIH stroke score was 22.       Prior Functioning  Home Living Family/patient expects to be discharged to:: Inpatient rehab Living Arrangements: Children Additional Comments: dtr and son live with her Prior Function Level of Independence: Needs assistance Gait / Transfers Assistance Needed: per dtr, antalgic gait but didn't use cane or walker ADL's / Homemaking Assistance Needed: up until 2 days ago pt indep with shower seat for bathing/dressing. last few days pt was sponge bathing b/c of LE pain Comments: family assisted with meal prep, as able to feed self Communication Communication: Receptive difficulties;Expressive difficulties Dominant Hand: Right    Cognition  Cognition Arousal/Alertness: Lethargic Behavior During Therapy: Flat affect Overall Cognitive Status: Impaired/Different from baseline Area of Impairment:  Orientation;Attention;Memory;Safety/judgement;Awareness;Problem solving;Following commands Orientation Level: Disoriented to;Place;Time;Situation Current Attention Level: Focused (L sided inattention)  Following Commands: Follows one step commands inconsistently (able to follow 100% with R, none on L) Safety/Judgement: Decreased awareness of deficits (L sided neglect) Awareness: Intellectual Problem Solving: Decreased initiation;Difficulty sequencing;Requires verbal cues;Requires tactile cues;Slow processing    Extremity/Trunk Assessment Upper Extremity Assessment Upper Extremity Assessment: LUE deficits/detail LUE Deficits / Details: L sided flaccid, did not withdrawl to pain, suspect mod to max sensory impairment Lower Extremity Assessment Lower Extremity Assessment: LLE deficits/detail LLE Deficits / Details: flaccid, no withdrawl to pain Cervical / Trunk Assessment Cervical / Trunk Assessment: Normal   Balance Balance Overall balance assessment: Needs assistance Sitting-balance support: Single extremity supported;Feet supported Sitting balance-[name]: Zero Sitting balance - Comments: pt dependent to maintain upright position Postural control: Posterior lean;Left lateral lean General Comments General comments (skin integrity, edema, etc.): pt with R gaze even with turning head to Left  End of Session PT - End of Session Activity Tolerance: Patient tolerated treatment well Patient left: in bed;with call bell/phone within reach;with family/visitor present Nurse Communication: Mobility status  GP     Marcene Brawn 01/12/2014, 9:35 AM  Lewis Shock, PT, DPT Pager #: [phone] Office #: [phone]

## 2014-01-12 NOTE — Progress Notes (Signed)
INITIAL NUTRITION ASSESSMENT  DOCUMENTATION CODES Per approved criteria  -Obesity Unspecified   INTERVENTION: Diet texture and liquid consistency per SLP. If unable to pass swallow evaluation, recommend initiation of nutrition support. If enteral nutrition desired, recommend initiation of Osmolite 1.5 at 25 via enteral feeding tube, advance by 10 ml q 4 hours, to goal rate of 45 ml/hr. Add 30 ml Prostat liquid protein via tube daily. Goal regimen will provide: 1720 kcal, 83 grams protein, 823 ml free water. RD to continue to follow nutrition care plan.  NUTRITION DIAGNOSIS: Inadequate oral intake related to inability to eat as evidenced by NPO status.   Goal: Diet advancement as tolerated vs initiation of nutrition support. Intake to meet >90% of estimated nutrition needs.  Monitor:  weight trends, lab trends, I/O's, diet advancement vs initiation of nutrition support  Reason for Assessment: Low Braden Score  80 y.o. female  Admitting Dx: s/p R MCA  ASSESSMENT: PMHx significant for HLD, HTN, anxiety, GERD. Admitted after being found non-verbal, staring to the R and unable to move L side. Work-up reveals acute ischemic R MCA stroke.  Admission sodium was 115. Family states that pt she has been drinking large amounts of water for some time.  PT is currently recommending CIR. SLP consult pending at this time. Pt is NPO.  No family at bedside; pt is currently with cardiac tech at bedside. Malnutrition screening tool reveals no recent unintentional wt loss or poor appetite PTA.   Height: Ht Readings from Last 1 Encounters:  01/11/14 5\' 2"  (1.575 m)    Weight: Wt Readings from Last 1 Encounters:  01/11/14 190 lb 14.7 oz (86.6 kg)    Ideal Body Weight: 110 lb  % Ideal Body Weight: 173%  Wt Readings from Last 10 Encounters:  01/11/14 190 lb 14.7 oz (86.6 kg)  11/21/12 199 lb (90.266 kg)  05/05/12 198 lb (89.812 kg)  11/14/11 201 lb 6.4 oz (91.354 kg)    Usual Body  Weight: 200 lb  % Usual Body Weight: 95%  BMI:  Body mass index is 34.91 kg/(m^2). Obese Class I  Estimated Nutritional Needs: Kcal: 1550 - 1750 Protein: 75 - 95 g Fluid: per MD  Skin: intact  Diet Order:    EDUCATION NEEDS: -No education needs identified at this time   Intake/Output Summary (Last 24 hours) at 01/12/14 1135 Last data filed at 01/12/14 1100  Gross per 24 hour  Intake    825 ml  Output   1575 ml  Net   -750 ml    Last BM: PTA  Labs:   Recent Labs Lab 01/11/14 2152 01/12/14 0606 01/12/14 0940  NA 119* 127* 125*  K 6.4* 4.8 4.5  CL 77* 83* 83*  CO2 27 30 30   BUN 17 18 17   CREATININE 0.70 0.84 0.78  CALCIUM 9.5 9.8 9.4  GLUCOSE 150* 148* 154*    CBG (last 3)   Recent Labs  01/11/14 1545  GLUCAP 162*    Scheduled Meds: . aspirin  300 mg Rectal Daily   Or  . aspirin  325 mg Oral Daily  . cefTRIAXone (ROCEPHIN) IVPB 1 gram/50 mL D5W  1 g Intravenous Q24H  . levothyroxine  25 mcg Intravenous Daily  . metoprolol  2.5 mg Intravenous Q6H  . pantoprazole (PROTONIX) IV  40 mg Intravenous QHS    Continuous Infusions: . sodium chloride 75 mL/hr at 01/11/14 2323    Past Medical History  Diagnosis Date  . Hyperlipidemia   .  Hypertension   . Anxiety   . GERD (gastroesophageal reflux disease)   . Abnormal heart rhythms   . Fatty liver   . Internal hemorrhoid     Past Surgical History  Procedure Laterality Date  . Cesarean section    . Abdominal hysterectomy      Jarold Motto MS, RD, LDN Pager: [phone] After-hours pager: [phone]

## 2014-01-12 NOTE — Evaluation (Signed)
Clinical/Bedside Swallow Evaluation Patient Details  Name: Tammy Fernandez MRN: [id number] Date of Birth: [birth year]  Today's Date: 01/12/2014 Time: [id number] SLP Time Calculation (min): 8 min  Past Medical History:  Past Medical History  Diagnosis Date  . Hyperlipidemia   . Hypertension   . Anxiety   . GERD (gastroesophageal reflux disease)   . Abnormal heart rhythms   . Fatty liver   . Internal hemorrhoid    Past Surgical History:  Past Surgical History  Procedure Laterality Date  . Cesarean section    . Abdominal hysterectomy     HPI:  80 y.o. female with a history of hypertension and hyperlipidemia who developed acute onset of inability to speak, gaze to the right and paralysis of left extremities  CT negative for acute abnormality.    Assessment / Plan / Recommendation Clinical Impression  Pt with significant neurogenic dysphagia at this time with reduced oral awareness, nearly inactive mastication/propulsion of POs with oral holding; severely delayed and weak swallow response per palpation. Limited POs given secondary to severity of deficits.  No cough elicited, but suspect sensory deficits impacting reaction to penetrate/aspirate.  Recommend NPO for now; SLP to follow for PO trials and readiness for oral diet.  Will need oral suctioning set-up at bedside.    Aspiration Risk  Severe    Diet Recommendation NPO;Alternative means - temporary   Medication Administration: Via alternative means    Other  Recommendations Oral Care Recommendations: Oral care Q4 per protocol   Follow Up Recommendations  Inpatient Rehab    Frequency and Duration min 3x week  2 weeks       SLP Swallow Goals    See care plan Swallow Study Prior Functional Status       General Date of Onset: 01/11/14 HPI: 80 y.o. female with a history of hypertension and hyperlipidemia who developed acute onset of inability to speak, gaze to the right and paralysis of left extremities  Type of Study:  Bedside swallow evaluation Previous Swallow Assessment: none per records Diet Prior to this Study: NPO Temperature Spikes Noted: Yes Respiratory Status: Nasal cannula Behavior/Cognition: Alert;Doesn't follow directions;Decreased sustained attention;Requires cueing Oral Cavity - Dentition:  (natural teeth per family - difficult to pry pt's mouth open) Patient Positioning: Upright in bed Baseline Vocal Quality: Clear Volitional Cough: Cognitively unable to elicit Volitional Swallow: Unable to elicit    Oral/Motor/Sensory Function Overall Oral Motor/Sensory Function: Impaired (decreased CN V, VII on left)   Ice Chips Ice chips: Impaired Presentation: Spoon Oral Phase Impairments: Reduced lingual movement/coordination;Poor awareness of bolus;Impaired mastication Oral Phase Functional Implications: Prolonged oral transit;Oral holding Pharyngeal Phase Impairments: Suspected delayed Swallow;Decreased hyoid-laryngeal movement   Thin Liquid Thin Liquid: Impaired Presentation: Spoon Oral Phase Impairments: Reduced lingual movement/coordination;Impaired mastication;Poor awareness of bolus Oral Phase Functional Implications: Prolonged oral transit;Oral holding Pharyngeal  Phase Impairments: Suspected delayed Swallow;Decreased hyoid-laryngeal movement    Nectar Thick Nectar Thick Liquid: Not tested   Honey Thick Honey Thick Liquid: Not tested   Puree Puree: Impaired Presentation: Spoon Oral Phase Impairments: Impaired anterior to posterior transit;Poor awareness of bolus Oral Phase Functional Implications: Oral holding Pharyngeal Phase Impairments: Suspected delayed Swallow   Solid   Tammy Fernandez, [place]MA CCC/SLP Pager [phone]     Solid: Not tested       Blenda MountsCouture, Tammy Fernandez 01/12/2014,2:06 PM

## 2014-01-12 NOTE — Progress Notes (Addendum)
[facility] Progress Note [place] TEAM 1 - Stepdown/ICU TEAM   Tammy Fernandez ZOX:[id number] DOB: 1924/01/26 DOA: 01/11/2014 PCP: Tammy Patella, MD  Brief narrative: Tammy Fernandez is a 80 y.o. female presenting on 01/11/2014 with  has a past medical history of Hyperlipidemia; Hypertension; Anxiety; GERD (gastroesophageal reflux disease); Abnormal heart rhythms; Fatty liver; and Internal hemorrhoid who was noted by family to be nonverbal, looking to the right and not moving her left side at 2:30 PM after last being seen normal at 10 AM. CT head in ER was negative. She was also noted to have a low sodium and a UTI.    Subjective: Pt is communicating well and is aware she has had a stroke. No complaints.   Assessment/Plan: Principal Problem:   Acute ischemic right MCA stroke - neuro work up reveals changed on MRI of possible right temporal invlovement and possible high grade stenosis vs embolus of right MS branch - also noted elevated LDL and low HDL - cont ASA, add statin when able to take POs - SLP eval completed and pt recommended to stay NPO - Pt recommending CIR - f/u on ECHO and carotid ultrasound  Active Problems:   Hypertension - cont IV Metoprolol    Hyponatremia - appears to have a chronic component upon discussion with family - she has been eating better in past 4-5 days but prior to it, she had vomiting and thereafter was eating poorly.  - currently is clearly dehydrated and sodium has improved with IVF - appreciate nephro consult- serum cortisol ordered ' - cont to hydrate while NPO- change IVF to 1/2 NS at 100 cc/her hr to prevent rapid correction of sodium    Hyperkalemia - resolved    Hematuria - likely due to UTI    UTI (urinary tract infection) - cont Rocephin and f/u cultures  Low TSH - check Free T4 - resume Levothyroxine via IV    Code Status: Full code Family Communication: with sons and daughter Disposition Plan: follow in SDU  today  Consultants: neuro  Procedures: none  Antibiotics: Ceftriaxone 2/9  DVT prophylaxis: SCDs  Objective: Filed Weights   01/11/14 2230  Weight: 86.6 kg (190 lb 14.7 oz)   Blood pressure 161/64, pulse 102, temperature 102.7 F (39.3 C), temperature source Oral, resp. rate 17, height 5\' 2"  (1.575 m), weight 86.6 kg (190 lb 14.7 oz), SpO2 96.00%.  Intake/Output Summary (Last 24 hours) at 01/12/14 1616 Last data filed at 01/12/14 1500  Gross per 24 hour  Intake    995 ml  Output   1725 ml  Net   -730 ml     Exam: General: No acute respiratory distress Lungs: Clear to auscultation bilaterally without wheezes or crackles Cardiovascular: Regular rate and rhythm without murmur gallop or rub normal S1 and S2 Abdomen: Nontender, nondistended, soft, bowel sounds positive, no rebound, no ascites, no appreciable mass Extremities: No significant cyanosis, clubbing, or edema bilateral lower extremities  Data Reviewed: Basic Metabolic Panel:  Recent Labs Lab 01/11/14 1544 01/11/14 1709 01/11/14 2152 01/12/14 0606 01/12/14 0940 01/12/14 1330  NA 114* 115* 119* 127* 125* 124*  K 5.6* 5.7* 6.4* 4.8 4.5 4.7  CL 72* 79* 77* 83* 83* 83*  CO2 27  --  27 30 30 28   GLUCOSE 157* 160* 150* 148* 154* 142*  BUN 16 19 17 18 17 18   CREATININE 0.75 1.00 0.70 0.84 0.78 0.83  CALCIUM 10.0  --  9.5 9.8 9.4 9.4  Liver Function Tests:  Recent Labs Lab 01/11/14 1544  AST 22  ALT 11  ALKPHOS 89  BILITOT 0.5  PROT 8.3  ALBUMIN 3.2*   No results found for this basename: LIPASE, AMYLASE,  in the last 168 hours No results found for this basename: AMMONIA,  in the last 168 hours CBC:  Recent Labs Lab 01/11/14 1544 01/11/14 1709  WBC 11.9*  --   NEUTROABS 8.8*  --   HGB 15.0 17.3*  HCT 41.5 51.0*  MCV 84.7  --   PLT 321  --    Cardiac Enzymes:  Recent Labs Lab 01/11/14 1544  TROPONINI <0.30   BNP (last 3 results) No results found for this basename: PROBNP,  in the  last 8760 hours CBG:  Recent Labs Lab 01/11/14 1545 01/12/14 1233  GLUCAP 162* 150*    Recent Results (from the past 240 hour(s))  MRSA PCR SCREENING     Status: None   Collection Time    01/12/14  6:35 AM      Result Value Range Status   MRSA by PCR NEGATIVE  NEGATIVE Final   Comment:            The GeneXpert MRSA Assay (FDA     approved for NASAL specimens     only), is one component of a     comprehensive MRSA colonization     surveillance program. It is not     intended to diagnose MRSA     infection nor to guide or     monitor treatment for     MRSA infections.     Studies:  Recent x-ray studies have been reviewed in detail by the Attending Physician  Scheduled Meds:  Scheduled Meds: . aspirin  300 mg Rectal Daily   Or  . aspirin  325 mg Oral Daily  . cefTRIAXone (ROCEPHIN) IVPB 1 gram/50 mL D5W  1 g Intravenous Q24H  . levothyroxine  25 mcg Intravenous Daily  . metoprolol  2.5 mg Intravenous Q6H  . pantoprazole (PROTONIX) IV  40 mg Intravenous QHS   Continuous Infusions: . sodium chloride 50 mL/hr at 01/12/14 1152    Time spent on care of this patient: >35 min   Tammy CantorIZWAN,[name], MD  [facility] Office  [phone] Pager - Text Page per Loretha StaplerAmion as per below:  On-Call/Text Page:      [email or web address]      password [email or web address]  If 7PM-7AM, please contact night-coverage [email or web address] Password [email or web address] 01/12/2014, 4:16 PM   LOS: 1 day

## 2014-01-12 NOTE — Plan of Care (Signed)
Problem: Acute Treatment Outcomes Goal: Neuro exam at baseline or improved Outcome: Progressing NIH is slightly improved from admission Goal: BP within ordered parameters Outcome: Progressing BP150's 160's systolic with prn labatelol Goal: 02 Sats > 94% Outcome: Progressing 96 % on 3 liters Goal: Hemodynamically stable Outcome: Progressing VSS Goal: Prognosis discussed with family/patient as appropriate Outcome: Progressing Prognosis has been dicussed with patient, s family by MD and question answered by my self

## 2014-01-12 NOTE — Progress Notes (Signed)
  Echocardiogram 2D Echocardiogram has been performed.  Tammy Fernandez, Tammy Fernandez 01/12/2014, 5:50 PM

## 2014-01-13 DIAGNOSIS — G92 Toxic encephalopathy: Secondary | ICD-10-CM

## 2014-01-13 DIAGNOSIS — R7881 Bacteremia: Secondary | ICD-10-CM | POA: Diagnosis present

## 2014-01-13 DIAGNOSIS — G929 Unspecified toxic encephalopathy: Secondary | ICD-10-CM

## 2014-01-13 DIAGNOSIS — J9601 Acute respiratory failure with hypoxia: Secondary | ICD-10-CM | POA: Diagnosis present

## 2014-01-13 LAB — CORTISOL: CORTISOL PLASMA: 54.5 ug/dL

## 2014-01-13 LAB — GLUCOSE, CAPILLARY: GLUCOSE-CAPILLARY: 99 mg/dL (ref 70–99)

## 2014-01-13 LAB — URINE CULTURE: Colony Count: >=100000

## 2014-01-13 LAB — RENAL FUNCTION PANEL
Albumin: 3 g/dL — ABNORMAL LOW (ref 3.5–5.2)
BUN: 27 mg/dL — ABNORMAL HIGH (ref 6–23)
CHLORIDE: 82 meq/L — AB (ref 96–112)
CO2: 29 mEq/L (ref 19–32)
CREATININE: 0.93 mg/dL (ref 0.50–1.10)
Calcium: 9.4 mg/dL (ref 8.4–10.5)
GFR, EST AFRICAN AMERICAN: 62 mL/min — AB (ref >90)
GFR, EST NON AFRICAN AMERICAN: 54 mL/min — AB (ref >90)
GLUCOSE: 174 mg/dL — AB (ref 70–99)
Phosphorus: 3.3 mg/dL (ref 2.3–4.6)
Potassium: 4.4 mEq/L (ref 3.7–5.3)
Sodium: 125 mEq/L — ABNORMAL LOW (ref 137–147)

## 2014-01-13 LAB — T4, FREE: Free T4: 1.82 ng/dL — ABNORMAL HIGH (ref 0.80–1.80)

## 2014-01-13 LAB — OSMOLALITY, URINE: Osmolality, Ur: 655 mOsm/kg (ref 390–1090)

## 2014-01-13 MED ORDER — CEFAZOLIN SODIUM-DEXTROSE 2-3 GM-% IV SOLR
2.0000 g | Freq: Three times a day (TID) | INTRAVENOUS | Status: DC
  Administered 2014-01-13 – 2014-01-15 (×6): 2 g via INTRAVENOUS
  Filled 2014-01-13 (×8): qty 50

## 2014-01-13 MED ORDER — DEXTROSE 5 % IV SOLN
2.0000 g | Freq: Two times a day (BID) | INTRAVENOUS | Status: DC
  Administered 2014-01-13: 2 g via INTRAVENOUS
  Filled 2014-01-13 (×2): qty 2

## 2014-01-13 MED ORDER — DEXTROSE 5 % IV SOLN
10.0000 mg/kg | Freq: Two times a day (BID) | INTRAVENOUS | Status: DC
  Filled 2014-01-13 (×2): qty 10

## 2014-01-13 MED ORDER — SODIUM CHLORIDE 0.9 % IV SOLN
1500.0000 mg | Freq: Once | INTRAVENOUS | Status: AC
  Administered 2014-01-13: 1500 mg via INTRAVENOUS
  Filled 2014-01-13: qty 1500

## 2014-01-13 MED ORDER — METOPROLOL TARTRATE 1 MG/ML IV SOLN
5.0000 mg | Freq: Four times a day (QID) | INTRAVENOUS | Status: DC
  Administered 2014-01-13 – 2014-01-15 (×9): 5 mg via INTRAVENOUS
  Filled 2014-01-13 (×9): qty 5

## 2014-01-13 MED ORDER — VANCOMYCIN HCL IN DEXTROSE 1-5 GM/200ML-% IV SOLN
1000.0000 mg | INTRAVENOUS | Status: DC
  Administered 2014-01-14 – 2014-01-15 (×2): 1000 mg via INTRAVENOUS
  Filled 2014-01-13 (×3): qty 200

## 2014-01-13 NOTE — Progress Notes (Signed)
ANTIBIOTIC CONSULT NOTE - INITIAL  Pharmacy Consult for Acyclovir Indication: rule out meningitis  Allergies  Allergen Reactions  . Crestor [Rosuvastatin Calcium]   . Sulfa Drugs Cross Reactors Rash    Patient Measurements: Height: 5\' 2"  (157.5 cm) Weight: 190 lb 14.7 oz (86.6 kg) IBW/kg (Calculated) : 50.1  Vital Signs: Temp: 98.4 F (36.9 C) (02/11 0700) Temp src: Oral (02/11 0700) BP: 166/79 mmHg (02/11 0745) Pulse Rate: 121 (02/11 0900) Intake/Output from previous day: 02/10 0701 - 02/11 0700 In: 2095 [I.V.:1595; IV Piggyback:500] Out: 850 [Urine:850] Intake/Output from this shift: Total I/O In: 100 [I.V.:100] Out: -   Labs:  Recent Labs  01/11/14 1544 01/11/14 1709  01/12/14 0606 01/12/14 0658  01/12/14 1330 01/12/14 1726 01/13/14 0223  WBC 11.9*  --   --   --   --   --   --   --   --   HGB 15.0 17.3*  --   --   --   --   --   --   --   PLT 321  --   --   --   --   --   --   --   --   LABCREA  --   --   --   --  154.68  --   --   --   --   CREATININE 0.75 1.00  < > 0.84  --   < > 0.83 0.96 0.93  < > = values in this interval not displayed. Estimated Creatinine Clearance: 43.5 ml/min (by C-G formula based on Cr of 0.93).    Microbiology: Recent Results (from the past 720 hour(s))  URINE CULTURE     Status: None   Collection Time    01/11/14  7:51 PM      Result Value Ref Range Status   Specimen Description URINE, CATHETERIZED   Final   Special Requests NONE   Final   Culture  Setup Time     Final   Value: 01/11/2014 21:34     Performed at [facility]   Colony Count     Final   Value: >=100,000 COLONIES/ML     Performed at [facility]   Culture     Final   Value: GRAM NEGATIVE RODS     Performed at [facility]   Report Status PENDING   Incomplete  CULTURE, BLOOD (ROUTINE X 2)     Status: None   Collection Time    01/12/14  6:00 AM      Result Value Ref Range Status   Specimen Description BLOOD RIGHT HAND   Final   Special Requests BOTTLES DRAWN AEROBIC ONLY 5CC   Final   Culture  Setup Time     Final   Value: 01/12/2014 10:21     Performed at [facility]   Culture     Final   Value: GRAM POSITIVE COCCI IN CLUSTERS     Note: Gram Stain Report Called to,Read Back By and Verified With: Gray Bernhardt [id number] BRMEL     Performed at [facility]   Report Status PENDING   Incomplete  CULTURE, BLOOD (ROUTINE X 2)     Status: None   Collection Time    01/12/14  6:06 AM      Result Value Ref Range Status   Specimen Description BLOOD RIGHT ANTECUBITAL   Final   Special Requests BOTTLES DRAWN AEROBIC ONLY 3CC   Final  Culture  Setup Time     Final   Value: 01/12/2014 10:21     [facility]   Culture     Final   Value: GRAM POSITIVE COCCI IN CLUSTERS     Note: Gram Stain Report Called to,Read Back By and Verified With: Kerby MoorsENEE BROWN [id number] BRMEL     [facility]   Report Status PENDING   Incomplete  MRSA PCR SCREENING     Status: None   Collection Time    01/12/14  6:35 AM      Result Value Ref Range Status   MRSA by PCR NEGATIVE  NEGATIVE Final   Comment:            The GeneXpert MRSA Assay (FDA     approved for NASAL specimens     only), is one component of a     comprehensive MRSA colonization     surveillance program. It is not     intended to diagnose MRSA     infection nor to guide or     monitor treatment for     MRSA infections.    Medical History: Past Medical History  Diagnosis Date  . Hyperlipidemia   . Hypertension   . Anxiety   . GERD (gastroesophageal reflux disease)   . Abnormal heart rhythms   . Fatty liver   . Internal hemorrhoid     Medications:  Scheduled:  . aspirin  300 mg Rectal Daily   Or  . aspirin  325 mg Oral Daily  . cefTRIAXone (ROCEPHIN)  IV  2 g Intravenous Q12H  . levothyroxine  25 mcg Intravenous Daily  . metoprolol  5 mg Intravenous 4 times per day  . pantoprazole (PROTONIX) IV   40 mg Intravenous QHS  . [START ON 01/14/2014] vancomycin  1,000 mg Intravenous Q24H   Assessment: 80 yo F known to pharmacy from previous Vancomycin and Rocephin dosing.  Pt has GNR UTI and GPC bacteremia.  Now to add Acyclovir with concern for meningitis.  Goal of Therapy:  Eradication of Infection  Plan:  Acyclovir 10mg /kg IV q12h (using IBW = 500mg ) Follow up culture data and renal function.  Toys 'R' UsKimberly Arina Torry, Pharm.D., BCPS Clinical Pharmacist Pager [phone] 01/13/2014 11:44 AM

## 2014-01-13 NOTE — Consult Note (Signed)
Physical Medicine and Rehabilitation Consult Reason for Consult: Acute encephalopathy Referring Physician: [facility]   HPI: Tammy Fernandez is a 80 y.o. female history hypertension. Patient independent and active prior to admission. Admitted 01/11/2014 with altered mental status staring to the right not moving her left side per family report. Noted sodium level 115 and potassium 5.7. MRI of the brain showed no convincing evidence to suggest acute ischemia with areas of patchy abnormality involving cortical as well as basal ganglia regions significant bleeding unclear. Echocardiogram and EEG pending. Carotid Dopplers with no ICA stenosis. Patient placed on intravenous fluids of normal saline for hyponatremia. Renal service followup for hyponatremia question element of SIADH and workup ongoing. Urine culture greater than 100,000 gram-negative rods placed on Rocephin. Physical therapy evaluation completed 01/12/2014 with recommendations of physical medicine rehabilitation consult to consider inpatient rehabilitation services.   Review of Systems  Gastrointestinal:       GERD  Musculoskeletal: Positive for myalgias.  Neurological: Positive for dizziness.  Psychiatric/Behavioral:       Anxiety  All other systems reviewed and are negative.   Past Medical History  Diagnosis Date  . Hyperlipidemia   . Hypertension   . Anxiety   . GERD (gastroesophageal reflux disease)   . Abnormal heart rhythms   . Fatty liver   . Internal hemorrhoid    Past Surgical History  Procedure Laterality Date  . Cesarean section    . Abdominal hysterectomy     Family History  Problem Relation Age of Onset  . Stroke Father   . Hypertension Father   . Colon cancer Cousin   . Colon cancer      Aunt  . Diabetes      aunt and cousins  . Heart disease      cousins   Social History:  reports that she has never smoked. She has never used smokeless tobacco. She reports that she does not drink alcohol or use  illicit drugs. Allergies:  Allergies  Allergen Reactions  . Crestor [Rosuvastatin Calcium]   . Sulfa Drugs Cross Reactors Rash   Medications Prior to Admission  Medication Sig Dispense Refill  . ALPRAZolam (XANAX) 0.5 MG tablet Take 0.5 mg by mouth at bedtime as needed for anxiety.      . Ascorbic Acid (VITAMIN C PO) Take 1 tablet by mouth daily.      Marland Kitchen. aspirin EC 81 MG tablet Take 81 mg by mouth daily.      . Calcium & Magnesium Carbonates (MYLANTA PO) Take 5 mLs by mouth daily as needed (constipation).      . calcium carbonate (TUMS - DOSED IN MG ELEMENTAL CALCIUM) 500 MG chewable tablet Chew 1 tablet by mouth daily as needed for indigestion or heartburn.      . Cimetidine (TAGAMET PO) Take 1 capsule by mouth daily.      Marland Kitchen. HAWTHORN BERRIES PO Take 1 tablet by mouth daily.      Marland Kitchen. levothyroxine (SYNTHROID, LEVOTHROID) 50 MCG tablet Take 50 mcg by mouth daily.       Marland Kitchen. lisinopril (PRINIVIL,ZESTRIL) 10 MG tablet Take 5 mg by mouth daily.      . meclizine (ANTIVERT) 12.5 MG tablet Take 12.5 mg by mouth 3 (three) times daily as needed for nausea.      Marland Kitchen. OVER THE COUNTER MEDICATION Place 1 drop into both eyes daily as needed (dry eyes). Eye drops      . promethazine (PHENERGAN) 25 MG tablet Take  25-50 mg by mouth 2 (two) times daily as needed for nausea.      . propranolol ER (INDERAL LA) 60 MG 24 hr capsule Take 60 mg by mouth daily.      . Red Yeast Rice Extract (RED YEAST RICE PO) Take 1 tablet by mouth daily.      . Simethicone (GAS-X PO) Take 1 tablet by mouth daily as needed (gas).      . [DISCONTINUED] Simethicone (GAS-X PO) Take by mouth as needed.        Home: Home Living Family/patient expects to be discharged to:: Inpatient rehab Living Arrangements: Children Available Help at Discharge: Family Additional Comments: dtr and son live with her  Lives With: Family  Functional History: Prior Function Comments: family assisted with meal prep, as able to feed self Functional  Status:  Mobility:          ADL:    Cognition: Cognition Overall Cognitive Status: Impaired/Different from baseline Arousal/Alertness: Awake/alert Orientation Level: Oriented to person;Oriented to place;Disoriented to time;Disoriented to situation Attention: Focused Focused Attention: Impaired Focused Attention Impairment: Verbal basic;Functional basic Memory: Impaired Memory Impairment: Storage deficit;Retrieval deficit Awareness: Impaired Awareness Impairment: Intellectual impairment Problem Solving: Impaired Problem Solving Impairment: Verbal basic;Functional basic Behaviors: Perseveration Safety/Judgment: Impaired Cognition Arousal/Alertness: Lethargic Behavior During Therapy: Flat affect Overall Cognitive Status: Impaired/Different from baseline Area of Impairment: Orientation;Attention;Memory;Safety/judgement;Awareness;Problem solving;Following commands Orientation Level: Disoriented to;Place;Time;Situation Current Attention Level: Focused (L sided inattention) Following Commands: Follows one step commands inconsistently (able to follow 100% with R, none on L) Safety/Judgement: Decreased awareness of deficits (L sided neglect) Awareness: Intellectual Problem Solving: Decreased initiation;Difficulty sequencing;Requires verbal cues;Requires tactile cues;Slow processing  Blood pressure 186/69, pulse 117, temperature 99.5 F (37.5 C), temperature source Oral, resp. rate 17, height 5\' 2"  (1.575 m), weight 86.6 kg (190 lb 14.7 oz), SpO2 98.00%. Physical Exam  Constitutional:  Morbidly obese  Eyes:  Eyes are deviated to the right with no movement to the left  Neck: Normal range of motion. Neck supple. No thyromegaly present.  Cardiovascular: Normal rate and regular rhythm.   Respiratory: Effort normal and breath sounds normal. No respiratory distress.  GI: Soft. Bowel sounds are normal. She exhibits no distension.  Neurological:  Patient is lethargic, hardly  arousable. Her speech is mostly garbled.  She did not follow commands. Withdrew to pain.   Skin: Skin is warm and dry.    Results for orders placed during the hospital encounter of 01/11/14 (from the past 24 hour(s))  CREATININE, URINE, RANDOM     Status: None   Collection Time    01/12/14  6:58 AM      Result Value Ref Range   Creatinine, Urine 154.68    OSMOLALITY, URINE     Status: None   Collection Time    01/12/14  6:58 AM      Result Value Ref Range   Osmolality, Ur 556  390 - 1090 mOsm/kg  NA AND K (SODIUM & POTASSIUM), RAND UR     Status: None   Collection Time    01/12/14  6:58 AM      Result Value Ref Range   Sodium, Ur <20     Potassium Urine Timed 54    BASIC METABOLIC PANEL     Status: Abnormal   Collection Time    01/12/14  9:40 AM      Result Value Ref Range   Sodium 125 (*) 137 - 147 mEq/L   Potassium 4.5  3.7 - 5.3 mEq/L  Chloride 83 (*) 96 - 112 mEq/L   CO2 30  19 - 32 mEq/L   Glucose, Bld 154 (*) 70 - 99 mg/dL   BUN 17  6 - 23 mg/dL   Creatinine, Ser 1.61  0.50 - 1.10 mg/dL   Calcium 9.4  8.4 - 09.6 mg/dL   GFR calc non Af Amer 73 (*) >90 mL/min   GFR calc Af Amer 85 (*) >90 mL/min  INFLUENZA PANEL BY PCR (TYPE A & B, H1N1)     Status: None   Collection Time    01/12/14 12:13 PM      Result Value Ref Range   Influenza A By PCR NEGATIVE  NEGATIVE   Influenza B By PCR NEGATIVE  NEGATIVE   H1N1 flu by pcr NOT DETECTED  NOT DETECTED  GLUCOSE, CAPILLARY     Status: Abnormal   Collection Time    01/12/14 12:33 PM      Result Value Ref Range   Glucose-Capillary 150 (*) 70 - 99 mg/dL  BASIC METABOLIC PANEL     Status: Abnormal   Collection Time    01/12/14  1:30 PM      Result Value Ref Range   Sodium 124 (*) 137 - 147 mEq/L   Potassium 4.7  3.7 - 5.3 mEq/L   Chloride 83 (*) 96 - 112 mEq/L   CO2 28  19 - 32 mEq/L   Glucose, Bld 142 (*) 70 - 99 mg/dL   BUN 18  6 - 23 mg/dL   Creatinine, Ser 0.45  0.50 - 1.10 mg/dL   Calcium 9.4  8.4 - 40.9 mg/dL    GFR calc non Af Amer 62 (*) >90 mL/min   GFR calc Af Amer 71 (*) >90 mL/min  BASIC METABOLIC PANEL     Status: Abnormal   Collection Time    01/12/14  5:26 PM      Result Value Ref Range   Sodium 123 (*) 137 - 147 mEq/L   Potassium 4.4  3.7 - 5.3 mEq/L   Chloride 82 (*) 96 - 112 mEq/L   CO2 28  19 - 32 mEq/L   Glucose, Bld 159 (*) 70 - 99 mg/dL   BUN 23  6 - 23 mg/dL   Creatinine, Ser 8.11  0.50 - 1.10 mg/dL   Calcium 9.5  8.4 - 91.4 mg/dL   GFR calc non Af Amer 52 (*) >90 mL/min   GFR calc Af Amer 60 (*) >90 mL/min  CORTISOL     Status: None   Collection Time    01/12/14  5:26 PM      Result Value Ref Range   Cortisol, Plasma 54.5    RENAL FUNCTION PANEL     Status: Abnormal   Collection Time    01/13/14  2:23 AM      Result Value Ref Range   Sodium 125 (*) 137 - 147 mEq/L   Potassium 4.4  3.7 - 5.3 mEq/L   Chloride 82 (*) 96 - 112 mEq/L   CO2 29  19 - 32 mEq/L   Glucose, Bld 174 (*) 70 - 99 mg/dL   BUN 27 (*) 6 - 23 mg/dL   Creatinine, Ser 7.82  0.50 - 1.10 mg/dL   Calcium 9.4  8.4 - 95.6 mg/dL   Phosphorus 3.3  2.3 - 4.6 mg/dL   Albumin 3.0 (*) 3.5 - 5.2 g/dL   GFR calc non Af Amer 54 (*) >90 mL/min   GFR calc Af  Amer 62 (*) >90 mL/min   Ct Head Wo Contrast  01/11/2014   CLINICAL DATA:  Left-sided weakness with right-sided gaze and slurred speech. Hypertension. Hyperlipidemia.  EXAM: CT HEAD WITHOUT CONTRAST  TECHNIQUE: Contiguous axial images were obtained from the base of the skull through the vertex without intravenous contrast.  COMPARISON:  No comparisons available.  FINDINGS: No intracranial hemorrhage.  Question tiny infarct superior left lenticular nucleus of questionable age.  No CT evidence of large acute infarct.  No intracranial mass lesion noted on this unenhanced exam.  No hydrocephalus.  IMPRESSION: No intracranial hemorrhage.  Question tiny infarct superior left lenticular nucleus of questionable age.  No CT evidence of large acute infarct.  No intracranial  mass lesion noted on this unenhanced exam.   Electronically Signed   By: Bridgett Larsson M.D.   On: 01/11/2014 16:10   Mr Angiogram Head Wo Contrast  01/11/2014   CLINICAL DATA:  Acute onset of inability to speak, gaze to the right and left paralysis. History of hypertension and hyperlipidemia.  EXAM: MRI HEAD WITHOUT CONTRAST  TECHNIQUE: Multiplanar, multiecho pulse sequences of the brain and surrounding structures were obtained without intravenous contrast. 3D time-of-flight MRA through the circle of Willis with maximum intensity projected reformations.  COMPARISON:  CT of the head January 11, 2014 at 1602 hr  FINDINGS: MRI head: Mild motion degraded examination. No reduced diffusion to suggest acute ischemia. No susceptibility artifact to suggest hemorrhage.  The ventricles and sulci are normal for patient's age. Scattered subcentimeter supratentorial white matter T2 hyperintensities are less than expected for age without midline shift or mass effect. Subtle asymmetric T2 hyperintense signal within the right temporal cortex, axial 8/21, apparent on the axial FLAIR sequence as well though, less conspicuous on the coronal T2.  No abnormal extra-axial fluid collections. Basal cisterns are patent.  Status post bilateral ocular lens implants. Paranasal sinuses and mastoid air cells appear well-aerated. No abnormal sellar expansion, cerebellar tonsils descend less than 5 mm below the foramen magnum, and do not appear pointed in appearance.  MRA head: Normal flow related enhancement of the cervical internal carotid arteries, with slight signal loss at a tortuous segment of the right cervical internal carotid artery, consistent with anatomical variant. Normal flow related enhancement of the petrous internal carotid arteries. There is a long segment of narrowing of the right cavernous internal carotid artery, less than 50% with normal flow related enhancement supra clinoid internal carotid arteries. The left A1 segment is  congenitally dominant, with predominately azygos anterior cerebral artery. The right middle cerebral artery is generally smaller than the left, this may be in part accentuated by tilt within the scanner. There is poor flow early enhancement of the right M3 branches, best seen on the reformations.  Nearly codominant vertebral arteries with normal flow of enhanced in the vertebrobasilar system and main branch vessels. Normal flow related enhancement of posterior cerebral arteries.  No large vessel occlusion, aneurysm, suspicious luminal irregularity within the anterior or posterior circulation. Very mild diffuse irregularity of the intracranial vessels suggests atherosclerosis.  IMPRESSION: MRI of the head: No convincing evidence of reduced diffusion to suggest acute ischemia, however there is mild bright signal within the right temporal cortex, in an area predominately obscured on the diffusion-weighted sequences and, early stroke could have this appearance.  Otherwise unremarkable MRI of the head for age.  MRA of the head: Poor enhancement of right M3 branches concerning for high-grade stenosis/ possible embolus.  Narrowing of the right carotid siphon  without hemodynamically significant stenosis through the circle Willis.  Critical Value/emergent results were called by telephone at the time of interpretation on 01/11/2014 at 10:31 PM to Dr. Dione Booze , who verbally acknowledged these results.   Electronically Signed   By: Awilda Metro   On: 01/11/2014 22:33   Mr Brain Wo Contrast  01/11/2014   CLINICAL DATA:  Acute onset of inability to speak, gaze to the right and left paralysis. History of hypertension and hyperlipidemia.  EXAM: MRI HEAD WITHOUT CONTRAST  TECHNIQUE: Multiplanar, multiecho pulse sequences of the brain and surrounding structures were obtained without intravenous contrast. 3D time-of-flight MRA through the circle of Willis with maximum intensity projected reformations.  COMPARISON:  CT of the  head January 11, 2014 at 1602 hr  FINDINGS: MRI head: Mild motion degraded examination. No reduced diffusion to suggest acute ischemia. No susceptibility artifact to suggest hemorrhage.  The ventricles and sulci are normal for patient's age. Scattered subcentimeter supratentorial white matter T2 hyperintensities are less than expected for age without midline shift or mass effect. Subtle asymmetric T2 hyperintense signal within the right temporal cortex, axial 8/21, apparent on the axial FLAIR sequence as well though, less conspicuous on the coronal T2.  No abnormal extra-axial fluid collections. Basal cisterns are patent.  Status post bilateral ocular lens implants. Paranasal sinuses and mastoid air cells appear well-aerated. No abnormal sellar expansion, cerebellar tonsils descend less than 5 mm below the foramen magnum, and do not appear pointed in appearance.  MRA head: Normal flow related enhancement of the cervical internal carotid arteries, with slight signal loss at a tortuous segment of the right cervical internal carotid artery, consistent with anatomical variant. Normal flow related enhancement of the petrous internal carotid arteries. There is a long segment of narrowing of the right cavernous internal carotid artery, less than 50% with normal flow related enhancement supra clinoid internal carotid arteries. The left A1 segment is congenitally dominant, with predominately azygos anterior cerebral artery. The right middle cerebral artery is generally smaller than the left, this may be in part accentuated by tilt within the scanner. There is poor flow early enhancement of the right M3 branches, best seen on the reformations.  Nearly codominant vertebral arteries with normal flow of enhanced in the vertebrobasilar system and main branch vessels. Normal flow related enhancement of posterior cerebral arteries.  No large vessel occlusion, aneurysm, suspicious luminal irregularity within the anterior or  posterior circulation. Very mild diffuse irregularity of the intracranial vessels suggests atherosclerosis.  IMPRESSION: MRI of the head: No convincing evidence of reduced diffusion to suggest acute ischemia, however there is mild bright signal within the right temporal cortex, in an area predominately obscured on the diffusion-weighted sequences and, early stroke could have this appearance.  Otherwise unremarkable MRI of the head for age.  MRA of the head: Poor enhancement of right M3 branches concerning for high-grade stenosis/ possible embolus.  Narrowing of the right carotid siphon without hemodynamically significant stenosis through the circle Willis.  Critical Value/emergent results were called by telephone at the time of interpretation on 01/11/2014 at 10:31 PM to Dr. Dione Booze , who verbally acknowledged these results.   Electronically Signed   By: Awilda Metro   On: 01/11/2014 22:33   Dg Chest Port 1 View  01/12/2014   CLINICAL DATA:  Fever.  EXAM: PORTABLE CHEST - 1 VIEW  COMPARISON:  NM MYOCAR MULTI w/SPECT w/WALL MOTION / EF dated 08/27/2013  FINDINGS: Patient is rotated to the right. Chronic elevation  of the right hemidiaphragm opacity at the right chest. Tortuous thoracic aorta appears similar. Lung volumes are lower than on prior studies. Monitoring leads project over the chest. There is no gross focal consolidation identified. Cardiomediastinal contours appear similar allowing for rotation.  IMPRESSION: Lower lung volumes with chronic elevation of the right hemidiaphragm. No gross acute cardiopulmonary disease.   Electronically Signed   By: Andreas Newport M.D.   On: 01/12/2014 01:27    Assessment/Plan: Diagnosis: sepsis, metabolic encephalopathy 1. Does the need for close, 24 hr/day medical supervision in concert with the patient's rehab needs make it unreasonable for this patient to be served in a less intensive setting? Potentially 2. Co-Morbidities requiring supervision/potential  complications: morbid obesity, SIADH, ?left sided weakness 3. Due to bladder management, bowel management, safety, skin/wound care, disease management, medication administration, pain management and patient education, does the patient require 24 hr/day rehab nursing? Potentially 4. Does the patient require coordinated care of a physician, rehab nurse, PT (1-2 hrs/day, 5 days/week), OT (1-2 hrs/day, 5 days/week) and SLP (1-2 hrs/day, 5 days/week) to address physical and functional deficits in the context of the above medical diagnosis(es)? Potentially Addressing deficits in the following areas: balance, endurance, locomotion, strength, transferring, bowel/bladder control, bathing, dressing, feeding, grooming, toileting, cognition, speech, language, swallowing and psychosocial support 5. Can the patient actively participate in an intensive therapy program of at least 3 hrs of therapy per day at least 5 days per week? Potentially 6. The potential for patient to make measurable gains while on inpatient rehab is good and fair 7. Anticipated functional outcomes upon discharge from inpatient rehab are ?min to mod assist with PT, ?min to mod assist with OT, ?min assist to supervision with SLP. 8. Estimated rehab length of stay to reach the above functional goals is: 15-25 days? 9. Does the patient have adequate social supports to accommodate these discharge functional goals? Potentially 10. Anticipated D/C setting: Home 11. Anticipated post D/C treatments: HH therapy 12. Overall Rehab/Functional Prognosis: good and fair  RECOMMENDATIONS: This patient's condition is appropriate for continued rehabilitative care in the following setting: Potentially CIR--very early in rehab course Patient has agreed to participate in recommended program. not app Note that insurance prior authorization may be required for reimbursement for recommended care.  Comment: pt extremely lethargic upon my visit. Has very supportive  family. Will follow as she progresses medically and functionally. Will benefit from CIR if physically able to tolerate the activity levels. Rehab Admissions Coordinator to follow up.  Thanks,  Ranelle Oyster, MD, [place] [facility]     01/13/2014

## 2014-01-13 NOTE — Clinical Social Work Placement (Signed)
Clinical Social Work Department CLINICAL SOCIAL WORK PLACEMENT NOTE 01/13/2014  Patient:  Tammy Fernandez,Tammy Fernandez  Account Number:  [id number] Admit date:  01/11/2014  Clinical Social Worker:  Cherre BlancJOSEPH BRYANT Briscoe Daniello, [place]  Date/time:  01/13/2014 02:56 PM  Clinical Social Work is seeking post-discharge placement for this patient at the following level of care:   SKILLED NURSING   (*CSW will update this form in Epic as items are completed)   01/12/2014  [facility]family provided with [facility]'s list of facilities offering this level of care within the geographic area requested by the patient (or if unable, by the patient's family).  01/12/2014  Patient/family informed of their freedom to choose among providers that offer the needed level of care, that participate in Medicare, Medicaid or managed care program needed by the patient, have an available bed and are willing to accept the patient.  01/12/2014  Patient/family informed of [facility]' ownership interest in [facility], as well as of the fact that they are under no obligation to receive care at this facility.  PASARR submitted to EDS on 01/13/2014 PASARR number received from EDS on 01/13/2014  FL2 transmitted to all facilities in geographic area requested by pt/family on  01/13/2014 FL2 transmitted to all facilities within larger geographic area on   Patient informed that his/her managed care company has contracts with or will negotiate with  certain facilities, including the following:     Patient/family informed of bed offers received:   Patient chooses bed at  Physician recommends and patient chooses bed at    Patient to be transferred to  on   Patient to be transferred to facility by   The following physician request were entered in Epic:   Additional Comments:   Roddie McBryant Eather Chaires, [place], [place], [phone]

## 2014-01-13 NOTE — Progress Notes (Signed)
Speech Language Pathology Treatment: Dysphagia;Cognitive-Linquistic  Patient Details Name: Tammy Fernandez MRN: [id number] DOB: 1924/07/04 Today's Date: 01/13/2014 Time: 1330-1400 SLP Time Calculation (min): 30 min  Assessment / Plan / Recommendation Clinical Impression  Per RN, pt requesting something to drink.  Pt allowed oral care, but required encouragement to do so.  Pt was noted to open her left eye intermittently, and answered "yes" to only 1 question (do you want something to drink?). Pt did not follow commands or respond consistently, and was not able to demonstrate ability to take ice chip from spoon.  At this point in time, NPO status continues to be recommended, with consideration of temporary nonoral feeding method. ST to continue to follow for po readiness and com/cog tx.   HPI HPI: 80 y.o. female with a history of hypertension and hyperlipidemia who developed acute onset of inability to speak, gaze to the right and paralysis of left extremities    Pertinent Vitals VSS  SLP Plan  Continue with current plan of care    Recommendations Diet recommendations: NPO (recommend consideration of alternate feeding method) Medication Administration: Via alternative means              Oral Care Recommendations: Oral care Q4 per protocol Follow up Recommendations: Inpatient Rehab Plan: Continue with current plan of care    GO    Luisantonio Adinolfi B. Murvin NatalBueche, [facility] HospitalMSP, CCC-SLP [phone]  Leigh AuroraBueche, Sanaya Gwilliam Brown 01/13/2014, 2:08 PM

## 2014-01-13 NOTE — Progress Notes (Signed)
Progress Note [facility] TEAM 1 - Stepdown/ICU TEAM   Tammy Fernandez MWU:[id number] DOB: 23-Feb-1924 DOA: 01/11/2014 PCP: [name], MD  Brief narrative: 80 y.o. female presenting on 01/11/2014 with a past medical history of Hyperlipidemia; Hypertension; Anxiety; GERD (gastroesophageal reflux disease); Abnormal heart rhythms; Fatty liver; and Internal hemorrhoid who was noted by family to be nonverbal, looking to the right and not moving her left side at 2:30 PM after last being seen normal at 10 AM. CT head in ER was negative. She was also noted to have a low sodium and a UTI.   Subjective: Pt was lethargic upon our exam.  Assessment/Plan:  Left side weakness/encephalopathy - MRI equivocal but not definitive in r/o CVA- Neuro concerned re: presenting sx's so is repeating MRI -also agree that with concurrent bacteremia that septic emboli a possibility-TTE pending -Neuro treating as possible meningitis-ID does not favor pursuit of LP since would not change tx outcome  Sepsis due to E Coli UTI and GPC bacteremia -cont Rocephin but with possibility of concurrent meningitis will increase dose to 2 gm Q12 -follow up on blood cx's -essentially pan sensitive e coli (resistant to amp) -appreciate ID assistance-agree with treat bacteremia as possible MRSA -normotensive but still with tachycardia and had temp spike to 103 on 2/10   Acute respiratory failure with hypoxia -seems r/t hypoventilation and AMS -no focal infiltrate on CXR    Hypertension - cont IV Metoprolol-dose increased today 2/11    Hyponatremia - appears to have a chronic component upon discussion with family - seems dehydrated and sodium slowly improving with fluids - appreciate nephro consult- serum cortisol normal - Nephro feels now euvolemic and has now initiated fluid restriction    Hyperkalemia - resolved    Hematuria - likely due to UTI    Low TSH -  Free T4 slightly elevated so suspect sick  euthyroid syndrome - cont usual dose Levothyroxine via IV -repeat TSH 8-12 weeks post dc    Code Status: Full code Family Communication: with sons and daughter at bedside Disposition Plan: follow in SDU   Consultants: neuro  Procedures: TTE pending  Antibiotics: Ceftriaxone 2/9 >>> Vancomycin 2/10 >>> Acyclovir 2/11 >>>  DVT prophylaxis: SCDs  Objective: Filed Weights   01/11/14 2230  Weight: 190 lb 14.7 oz (86.6 kg)   Blood pressure 181/74, pulse 121, temperature 98.6 F (37 C), temperature source Axillary, resp. rate 17, height 5\' 2"  (1.575 m), weight 190 lb 14.7 oz (86.6 kg), SpO2 97.00%.  Intake/Output Summary (Last 24 hours) at 01/13/14 1432 Last data filed at 01/13/14 1100  Gross per 24 hour  Intake   1820 ml  Output    900 ml  Net    920 ml     Exam: General: No acute respiratory distress-lethargic Lungs: Clear to auscultation bilaterally without wheezes or crackles- 3 L Cardiovascular: Regular rate and rhythm without murmur gallop or rub normal S1 and S2 Abdomen: Nontender, nondistended, soft, bowel sounds positive, no rebound, no ascites, no appreciable mass Extremities: No significant cyanosis, clubbing, or edema bilateral lower extremities  Data Reviewed: Basic Metabolic Panel:  Recent Labs Lab 01/12/14 0606 01/12/14 0940 01/12/14 1330 01/12/14 1726 01/13/14 0223  NA 127* 125* 124* 123* 125*  K 4.8 4.5 4.7 4.4 4.4  CL 83* 83* 83* 82* 82*  CO2 30 30 28 28 29   GLUCOSE 148* 154* 142* 159* 174*  BUN 18 17 18 23  27*  CREATININE 0.84 0.78 0.83 0.96 0.93  CALCIUM 9.8 9.4  9.4 9.5 9.4  PHOS  --   --   --   --  3.3   Liver Function Tests:  Recent Labs Lab 01/11/14 1544 01/13/14 0223  AST 22  --   ALT 11  --   ALKPHOS 89  --   BILITOT 0.5  --   PROT 8.3  --   ALBUMIN 3.2* 3.0*   No results found for this basename: LIPASE, AMYLASE,  in the last 168 hours No results found for this basename: AMMONIA,  in the last 168  hours  CBC:  Recent Labs Lab 01/11/14 1544 01/11/14 1709  WBC 11.9*  --   NEUTROABS 8.8*  --   HGB 15.0 17.3*  HCT 41.5 51.0*  MCV 84.7  --   PLT 321  --    Cardiac Enzymes:  Recent Labs Lab 01/11/14 1544  TROPONINI <0.30   BNP (last 3 results) No results found for this basename: PROBNP,  in the last 8760 hours CBG:  Recent Labs Lab 01/11/14 1545 01/12/14 1233 01/13/14 0025  GLUCAP 162* 150* 99    Recent Results (from the past 240 hour(s))  URINE CULTURE     Status: None   Collection Time    01/11/14  7:51 PM      Result Value Ref Range Status   Specimen Description URINE, CATHETERIZED   Final   Special Requests NONE   Final   Culture  Setup Time     Final   Value: 01/11/2014 21:34     Performed at [facility] Count     Final   Value: >=100,000 COLONIES/ML     Performed at [facility]   Culture     Final   Value: ESCHERICHIA COLI     Performed at [facility]   Report Status 01/13/2014 FINAL   Final   Organism ID, Bacteria ESCHERICHIA COLI   Final  CULTURE, BLOOD (ROUTINE X 2)     Status: None   Collection Time    01/12/14  6:00 AM      Result Value Ref Range Status   Specimen Description BLOOD RIGHT HAND   Final   Special Requests BOTTLES DRAWN AEROBIC ONLY 5CC   Final   Culture  Setup Time     Final   Value: 01/12/2014 10:21     Performed at [facility]   Culture     Final   Value: GRAM POSITIVE COCCI IN CLUSTERS     Note: Gram Stain Report Called to,Read Back By and Verified With: Gray Bernhardt [id number] BRMEL     Performed at [facility]   Report Status PENDING   Incomplete  CULTURE, BLOOD (ROUTINE X 2)     Status: None   Collection Time    01/12/14  6:06 AM      Result Value Ref Range Status   Specimen Description BLOOD RIGHT ANTECUBITAL   Final   Special Requests BOTTLES DRAWN AEROBIC ONLY 3CC   Final   Culture  Setup Time     Final   Value: 01/12/2014 10:21     Performed at [facility]   Culture     Final   Value: GRAM POSITIVE COCCI IN CLUSTERS     Note: Gram Stain Report Called to,Read Back By and Verified With: Kerby Moors [id number] BRMEL     Performed at [facility]   Report Status PENDING   Incomplete  MRSA  PCR SCREENING     Status: None   Collection Time    01/12/14  6:35 AM      Result Value Ref Range Status   MRSA by PCR NEGATIVE  NEGATIVE Final   Comment:            The GeneXpert MRSA Assay (FDA     approved for NASAL specimens     only), is one component of a     comprehensive MRSA colonization     surveillance program. It is not     intended to diagnose MRSA     infection nor to guide or     monitor treatment for     MRSA infections.     Studies:  Recent x-ray studies have been reviewed in detail by the Attending Physician  Scheduled Meds:  Scheduled Meds: . aspirin  300 mg Rectal Daily   Or  . aspirin  325 mg Oral Daily  .  ceFAZolin (ANCEF) IV  2 g Intravenous 3 times per day  . levothyroxine  25 mcg Intravenous Daily  . metoprolol  5 mg Intravenous 4 times per day  . pantoprazole (PROTONIX) IV  40 mg Intravenous QHS  . [START ON 01/14/2014] vancomycin  1,000 mg Intravenous Q24H    Time spent on care of this patient: 35 min   ELLIS,ALLISON L., ANP  [facility] Office  [phone] Pager - Text Page per Loretha StaplerAmion as per below:  On-Call/Text Page:      [email or web address]      password [email or web address]  If 7PM-7AM, please contact night-coverage [email or web address] Password [email or web address] 01/13/2014, 2:32 PM   LOS: 2 days   I have personally examined this patient and reviewed the entire database. I have reviewed the above note, made any necessary editorial changes, and agree with its content.  Lonia BloodJeffrey T. Anuar Walgren, MD [facility]

## 2014-01-13 NOTE — Progress Notes (Signed)
Subjective:  Possibly improving but still quite debilitated- BP high- getting 1/2 NS- sodium staying stable really without trend.   Objective Vital signs in last 24 hours: Filed Vitals:   01/13/14 0400 01/13/14 0500 01/13/14 0700 01/13/14 0745  BP: 207/126 186/69  166/79  Pulse: 116 117    Temp: 99.5 F (37.5 C)  98.4 F (36.9 C)   TempSrc: Oral  Oral   Resp: 22 17    Height:      Weight:      SpO2: 98% 98%     Weight change:   Intake/Output Summary (Last 24 hours) at 01/13/14 1012 Last data filed at 01/13/14 0800  Gross per 24 hour  Intake   2045 ml  Output    850 ml  Net   1195 ml    Assessment/ Plan: Pt is a 80 y.o. yo female who was admitted on 01/11/2014 with probable acute CVA with hyponatremia and UTI  Assessment/Plan: 1. Hyponatremia- initially responded well to only NS- I do not think is volume depleted anymore so I will stop IVF.  Again picture is confusing but likely does have an aspect of SIADH so we will initiate a fluid restriction of sorts and watch sodium.  TSH if anything overcontrolled, cortisol is high so these are not responsible.  I will check another urine osm now that she is no longer volume depleted to check for evidence/severity of SIADH.   2. HTN.vol- now hypertensive- will stop IVF, no lasix yet - increased lopressor to scheduled yesterday, will increase further.  3. Anemia- not an issue  Peace Jost A    Labs: Basic Metabolic Panel:  Recent Labs Lab 01/12/14 1330 01/12/14 1726 01/13/14 0223  NA 124* 123* 125*  K 4.7 4.4 4.4  CL 83* 82* 82*  CO2 28 28 29   GLUCOSE 142* 159* 174*  BUN 18 23 27*  CREATININE 0.83 0.96 0.93  CALCIUM 9.4 9.5 9.4  PHOS  --   --  3.3   Liver Function Tests:  Recent Labs Lab 01/11/14 1544 01/13/14 0223  AST 22  --   ALT 11  --   ALKPHOS 89  --   BILITOT 0.5  --   PROT 8.3  --   ALBUMIN 3.2* 3.0*   No results found for this basename: LIPASE, AMYLASE,  in the last 168 hours No results found for  this basename: AMMONIA,  in the last 168 hours CBC:  Recent Labs Lab 01/11/14 1544 01/11/14 1709  WBC 11.9*  --   NEUTROABS 8.8*  --   HGB 15.0 17.3*  HCT 41.5 51.0*  MCV 84.7  --   PLT 321  --    Cardiac Enzymes:  Recent Labs Lab 01/11/14 1544  TROPONINI <0.30   CBG:  Recent Labs Lab 01/11/14 1545 01/12/14 1233 01/13/14 0025  GLUCAP 162* 150* 99    Iron Studies: No results found for this basename: IRON, TIBC, TRANSFERRIN, FERRITIN,  in the last 72 hours Studies/Results: Ct Head Wo Contrast  01/11/2014   CLINICAL DATA:  Left-sided weakness with right-sided gaze and slurred speech. Hypertension. Hyperlipidemia.  EXAM: CT HEAD WITHOUT CONTRAST  TECHNIQUE: Contiguous axial images were obtained from the base of the skull through the vertex without intravenous contrast.  COMPARISON:  No comparisons available.  FINDINGS: No intracranial hemorrhage.  Question tiny infarct superior left lenticular nucleus of questionable age.  No CT evidence of large acute infarct.  No intracranial mass lesion noted on this unenhanced exam.  No  hydrocephalus.  IMPRESSION: No intracranial hemorrhage.  Question tiny infarct superior left lenticular nucleus of questionable age.  No CT evidence of large acute infarct.  No intracranial mass lesion noted on this unenhanced exam.   Electronically Signed   By: Bridgett LarssonSteve  Olson M.D.   On: 01/11/2014 16:10   Mr Angiogram Head Wo Contrast  01/11/2014   CLINICAL DATA:  Acute onset of inability to speak, gaze to the right and left paralysis. History of hypertension and hyperlipidemia.  EXAM: MRI HEAD WITHOUT CONTRAST  TECHNIQUE: Multiplanar, multiecho pulse sequences of the brain and surrounding structures were obtained without intravenous contrast. 3D time-of-flight MRA through the circle of Willis with maximum intensity projected reformations.  COMPARISON:  CT of the head January 11, 2014 at 1602 hr  FINDINGS: MRI head: Mild motion degraded examination. No reduced  diffusion to suggest acute ischemia. No susceptibility artifact to suggest hemorrhage.  The ventricles and sulci are normal for patient's age. Scattered subcentimeter supratentorial white matter T2 hyperintensities are less than expected for age without midline shift or mass effect. Subtle asymmetric T2 hyperintense signal within the right temporal cortex, axial 8/21, apparent on the axial FLAIR sequence as well though, less conspicuous on the coronal T2.  No abnormal extra-axial fluid collections. Basal cisterns are patent.  Status post bilateral ocular lens implants. Paranasal sinuses and mastoid air cells appear well-aerated. No abnormal sellar expansion, cerebellar tonsils descend less than 5 mm below the foramen magnum, and do not appear pointed in appearance.  MRA head: Normal flow related enhancement of the cervical internal carotid arteries, with slight signal loss at a tortuous segment of the right cervical internal carotid artery, consistent with anatomical variant. Normal flow related enhancement of the petrous internal carotid arteries. There is a long segment of narrowing of the right cavernous internal carotid artery, less than 50% with normal flow related enhancement supra clinoid internal carotid arteries. The left A1 segment is congenitally dominant, with predominately azygos anterior cerebral artery. The right middle cerebral artery is generally smaller than the left, this may be in part accentuated by tilt within the scanner. There is poor flow early enhancement of the right M3 branches, best seen on the reformations.  Nearly codominant vertebral arteries with normal flow of enhanced in the vertebrobasilar system and main branch vessels. Normal flow related enhancement of posterior cerebral arteries.  No large vessel occlusion, aneurysm, suspicious luminal irregularity within the anterior or posterior circulation. Very mild diffuse irregularity of the intracranial vessels suggests atherosclerosis.   IMPRESSION: MRI of the head: No convincing evidence of reduced diffusion to suggest acute ischemia, however there is mild bright signal within the right temporal cortex, in an area predominately obscured on the diffusion-weighted sequences and, early stroke could have this appearance.  Otherwise unremarkable MRI of the head for age.  MRA of the head: Poor enhancement of right M3 branches concerning for high-grade stenosis/ possible embolus.  Narrowing of the right carotid siphon without hemodynamically significant stenosis through the circle Willis.  Critical Value/emergent results were called by telephone at the time of interpretation on 01/11/2014 at 10:31 PM to Dr. Dione BoozeAVID GLICK , who verbally acknowledged these results.   Electronically Signed   By: Awilda Metroourtnay  Bloomer   On: 01/11/2014 22:33   Mr Brain Wo Contrast  01/11/2014   CLINICAL DATA:  Acute onset of inability to speak, gaze to the right and left paralysis. History of hypertension and hyperlipidemia.  EXAM: MRI HEAD WITHOUT CONTRAST  TECHNIQUE: Multiplanar, multiecho pulse sequences of  the brain and surrounding structures were obtained without intravenous contrast. 3D time-of-flight MRA through the circle of Willis with maximum intensity projected reformations.  COMPARISON:  CT of the head January 11, 2014 at 1602 hr  FINDINGS: MRI head: Mild motion degraded examination. No reduced diffusion to suggest acute ischemia. No susceptibility artifact to suggest hemorrhage.  The ventricles and sulci are normal for patient's age. Scattered subcentimeter supratentorial white matter T2 hyperintensities are less than expected for age without midline shift or mass effect. Subtle asymmetric T2 hyperintense signal within the right temporal cortex, axial 8/21, apparent on the axial FLAIR sequence as well though, less conspicuous on the coronal T2.  No abnormal extra-axial fluid collections. Basal cisterns are patent.  Status post bilateral ocular lens implants. Paranasal  sinuses and mastoid air cells appear well-aerated. No abnormal sellar expansion, cerebellar tonsils descend less than 5 mm below the foramen magnum, and do not appear pointed in appearance.  MRA head: Normal flow related enhancement of the cervical internal carotid arteries, with slight signal loss at a tortuous segment of the right cervical internal carotid artery, consistent with anatomical variant. Normal flow related enhancement of the petrous internal carotid arteries. There is a long segment of narrowing of the right cavernous internal carotid artery, less than 50% with normal flow related enhancement supra clinoid internal carotid arteries. The left A1 segment is congenitally dominant, with predominately azygos anterior cerebral artery. The right middle cerebral artery is generally smaller than the left, this may be in part accentuated by tilt within the scanner. There is poor flow early enhancement of the right M3 branches, best seen on the reformations.  Nearly codominant vertebral arteries with normal flow of enhanced in the vertebrobasilar system and main branch vessels. Normal flow related enhancement of posterior cerebral arteries.  No large vessel occlusion, aneurysm, suspicious luminal irregularity within the anterior or posterior circulation. Very mild diffuse irregularity of the intracranial vessels suggests atherosclerosis.  IMPRESSION: MRI of the head: No convincing evidence of reduced diffusion to suggest acute ischemia, however there is mild bright signal within the right temporal cortex, in an area predominately obscured on the diffusion-weighted sequences and, early stroke could have this appearance.  Otherwise unremarkable MRI of the head for age.  MRA of the head: Poor enhancement of right M3 branches concerning for high-grade stenosis/ possible embolus.  Narrowing of the right carotid siphon without hemodynamically significant stenosis through the circle Willis.  Critical Value/emergent  results were called by telephone at the time of interpretation on 01/11/2014 at 10:31 PM to Dr. Dione Booze , who verbally acknowledged these results.   Electronically Signed   By: Awilda Metro   On: 01/11/2014 22:33   Dg Chest Port 1 View  01/12/2014   CLINICAL DATA:  Fever.  EXAM: PORTABLE CHEST - 1 VIEW  COMPARISON:  NM MYOCAR MULTI w/SPECT w/WALL MOTION / EF dated 08/27/2013  FINDINGS: Patient is rotated to the right. Chronic elevation of the right hemidiaphragm opacity at the right chest. Tortuous thoracic aorta appears similar. Lung volumes are lower than on prior studies. Monitoring leads project over the chest. There is no gross focal consolidation identified. Cardiomediastinal contours appear similar allowing for rotation.  IMPRESSION: Lower lung volumes with chronic elevation of the right hemidiaphragm. No gross acute cardiopulmonary disease.   Electronically Signed   By: Andreas Newport M.D.   On: 01/12/2014 01:27   Medications: Infusions: . sodium chloride 100 mL/hr at 01/12/14 1947    Scheduled Medications: . aspirin  300 mg Rectal Daily   Or  . aspirin  325 mg Oral Daily  . cefTRIAXone (ROCEPHIN) IVPB 1 gram/50 mL D5W  1 g Intravenous Q24H  . levothyroxine  25 mcg Intravenous Daily  . metoprolol  2.5 mg Intravenous 4 times per day  . pantoprazole (PROTONIX) IV  40 mg Intravenous QHS  . [START ON 01/14/2014] vancomycin  1,000 mg Intravenous Q24H    have reviewed scheduled and prn medications.  Physical Exam: General: left neglect, one word to moan responses Heart: tachy Lungs: poor effort Abdomen: distended Extremities: mild edema    01/13/2014,10:12 AM  LOS: 2 days

## 2014-01-13 NOTE — Evaluation (Signed)
Occupational Therapy Evaluation Patient Details Name: Tammy Fernandez MRN: [id number] DOB: 1924/11/01 Today's Date: 01/13/2014 Time: [id number] OT Time Calculation (min): 24 min  OT Assessment / Plan / Recommendation History of present illness Tammy Fernandez is an 80 y.o. female with a history of hypertension and hyperlipidemia who developed acute onset of inability to speak, gaze to the right and paralysis of left extremities earlier today. Patient was last seen well at 9:30 this morning. She has no previous history of stroke nor TIA. She's been taking aspirin daily. CT scan of her head showed no acute intracranial abnormality. NIH stroke score was 22.   Clinical Impression   Pt admitted with the above findings and has the deficits listed below.  Pt would benefit from cont OT to increase I with basic adls so she can attempt to return home after rehab.  Pt currently with significant cognitive and physical deficits.    OT Assessment  Patient needs continued OT Services    Follow Up Recommendations  CIR;Supervision/Assistance - 24 hour    Barriers to Discharge   unsure how much assist pt has at home  Equipment Recommendations  Other (comment) (to be determined.)    Recommendations for Other Services Rehab consult  Frequency  Min 3X/week    Precautions / Restrictions Precautions Precautions: Fall Precaution Comments: pt would not open eyes during evaluation therefore difficult to assess neglect. Restrictions Weight Bearing Restrictions: No   Pertinent Vitals/Pain Pt did not c/o pain.  HR up to 121 w activity.    ADL  Eating/Feeding: NPO Grooming: Simulated;+1 Total assistance Where Assessed - Grooming: Supported sitting Upper Body Bathing: Simulated;+1 Total assistance Where Assessed - Upper Body Bathing: Supported sitting Lower Body Bathing: Simulated;+2 Total assistance Lower Body Bathing: Patient Percentage: 0% Where Assessed - Lower Body Bathing: Supported sit to  stand Upper Body Dressing: Simulated;+1 Total assistance Where Assessed - Upper Body Dressing: Supported sitting Lower Body Dressing: Simulated;+2 Total assistance Lower Body Dressing: Patient Percentage: 10% Where Assessed - Lower Body Dressing: Supported sit to Pharmacist, hospitalstand Toilet Transfer: Performed;+2 Total assistance Toilet Transfer: Patient Percentage: 60% StatisticianToilet Transfer Method: Surveyor, mineralstand pivot Toilet Transfer Equipment: Materials engineerBedside commode Toileting - Clothing Manipulation and Hygiene: Simulated;+2 Total assistance Toileting - ArchitectClothing Manipulation and Hygiene: Patient Percentage: 0% Where Assessed - Glass blower/designerToileting Clothing Manipulation and Hygiene: Standing Transfers/Ambulation Related to ADLs: Pt stood with total A x2 (60%) but unable to follow commands after standing to be able to pivot to chair so pt had to be dependently turned to sit in chair and dependent to move back into chair. ADL Comments: Pt not opening eyes and following enough commands to participate in adls.    OT Diagnosis: Generalized weakness;Cognitive deficits;Hemiplegia non-dominant side;Altered mental status  OT Problem List: Decreased strength;Decreased range of motion;Decreased activity tolerance;Impaired balance (sitting and/or standing);Impaired vision/perception;Decreased coordination;Decreased cognition;Decreased safety awareness;Decreased knowledge of use of DME or AE;Decreased knowledge of precautions;Impaired tone;Obesity;Impaired UE functional use OT Treatment Interventions: Self-care/ADL training;Neuromuscular education;Therapeutic activities;DME and/or AE instruction;Balance training   OT Goals(Current goals can be found in the care plan section) Acute Rehab OT Goals Patient Stated Goal: none stated OT Goal Formulation: With family Time For Goal Achievement: 01/27/14 Potential to Achieve Goals: Fair ADL Goals Pt Will Perform Grooming: with mod assist;sitting Pt Will Perform Upper Body Bathing: with mod  assist;sitting Pt Will Transfer to Toilet: with mod assist;squat pivot transfer;bedside commode Additional ADL Goal #1: Pt will open eyes and attend visually to 50% of treatment session to incresae awareness of surroundings.  Additional ADL Goal #2: Pt will stand for 1 minute with min assist in prep for adls in standing. Additional ADL Goal #3: Pt will use the L UE to assist with adls with moderate cues.  Visit Information  Last OT Received On: 01/13/14 Assistance Needed: +2 PT/OT/SLP Co-Evaluation/Treatment: Yes Reason for Co-Treatment: Complexity of the patient's impairments (multi-system involvement);Necessary to address cognition/behavior during functional activity;For patient/therapist safety OT goals addressed during session: ADL's and self-care History of Present Illness: Tammy Fernandez is an 80 y.o. female with a history of hypertension and hyperlipidemia who developed acute onset of inability to speak, gaze to the right and paralysis of left extremities earlier today. Patient was last seen well at 9:30 this morning. She has no previous history of stroke nor TIA. She's been taking aspirin daily. CT scan of her head showed no acute intracranial abnormality. NIH stroke score was 22.       Prior Functioning     Home Living Family/patient expects to be discharged to:: Inpatient rehab Living Arrangements: Children Available Help at Discharge: Family Additional Comments: dtr and son live with her  Lives With: Family Prior Function Level of Independence: Needs assistance Gait / Transfers Assistance Needed: per dtr, antalgic gait but didn't use cane or walker ADL's / Homemaking Assistance Needed: up until 2 days ago pt indep with shower seat for bathing/dressing. last few days pt was sponge bathing b/c of LE pain Comments: family assisted with meal prep, as able to feed self Communication Communication: Receptive difficulties;Expressive difficulties Dominant Hand: Right          Vision/Perception Vision - History Baseline Vision: Other (comment) (unable to test.  Pt would not open eyes.) Vision - Assessment Vision Assessment: Vision not tested Perception Perception:  (unable to test) Praxis Praxis: Not tested   Cognition  Cognition Arousal/Alertness: Lethargic Behavior During Therapy: Flat affect Overall Cognitive Status: Impaired/Different from baseline Area of Impairment: Orientation;Attention;Memory;Following commands;Safety/judgement;Awareness;Problem solving Orientation Level: Disoriented to;Place;Time;Situation Current Attention Level: Focused Memory: Decreased recall of precautions;Decreased short-term memory Following Commands: Follows one step commands inconsistently Safety/Judgement: Decreased awareness of deficits Awareness: Intellectual Problem Solving: Slow processing;Decreased initiation;Difficulty sequencing;Requires verbal cues;Requires tactile cues General Comments: Pt talked about wanting food and a drink but did not speak of anything else and followed appx 50% of commands.    Extremity/Trunk Assessment Upper Extremity Assessment Upper Extremity Assessment: LUE deficits/detail LUE Deficits / Details: Pt with movement throughout LUE today but unable to formally assess at pt would not follow commands to test L side.  Pt appears to either resist movement in this side or she has increased tone in this side.   LUE:  (unable to fully assess due to cognition) LUE Sensation:  (unable to fully assess.) LUE Coordination: decreased fine motor;decreased gross motor Lower Extremity Assessment Lower Extremity Assessment: Defer to PT evaluation Cervical / Trunk Assessment Cervical / Trunk Assessment: Other exceptions Cervical / Trunk Exceptions: Today pt rotating head toward the R and also with head tilt to the R.  When attempting to stretch and straighten head, pt resists this movement.     Mobility Bed Mobility Overal bed mobility: Needs  Assistance;+2 for physical assistance Bed Mobility: Rolling;Supine to Sit Rolling: Max assist Supine to sit: Total assist;+2 for physical assistance General bed mobility comments: Pt with slightly more initiation of tasks today but still haveing great difficulty motor planning. Transfers Overall transfer level: Needs assistance Equipment used: 2 person hand held assist Transfers: Stand Pivot Transfers;Sit to/from Stand Sit to Stand: +2  physical assistance;Mod assist Stand pivot transfers: +2 physical assistance;Total assist General transfer comment: Pt actually was able to support her weight when coming sit to stand.  Pt was unable to plan or follow through at all on a pivot transfer.  Therapists  had to pivot her .     Exercise     Balance Balance Overall balance assessment: Needs assistance Sitting-balance support: Bilateral upper extremity supported;Feet supported Sitting balance-[name]: Fair Sitting balance - Comments: Pt could sit with S for 30 sec to a minute with no challengees. Postural control: Posterior lean Standing balance support: Bilateral upper extremity supported;During functional activity Standing balance-[name]: Zero Standing balance comment: Pt was able to assist getting into standing but then required +2 assist to maintain standing to transfer. General Comments General comments (skin integrity, edema, etc.): unable to test any perception/vision/gaze due to pt not opening eyes during entire session.   End of Session OT - End of Session Activity Tolerance: Patient limited by lethargy Patient left: in chair;with call bell/phone within reach;with family/visitor present Nurse Communication: Mobility status;Need for lift equipment  GO     Hope Budds 01/13/2014, 11:02 AM [phone]

## 2014-01-13 NOTE — Progress Notes (Signed)
ANTIBIOTIC CONSULT NOTE - INITIAL Pharmacy Consult for Vancomycin Indication: bacteremia  Allergies  Allergen Reactions  . Crestor [Rosuvastatin Calcium]   . Sulfa Drugs Cross Reactors Rash    Patient Measurements: Height: 5\' 2"  (157.5 cm) Weight: 190 lb 14.7 oz (86.6 kg) IBW/kg (Calculated) : 50.1  Vital Signs: Temp: 100.4 F (38 C) (02/10 2300) Temp src: Oral (02/10 2300) BP: 187/69 mmHg (02/10 2300) Pulse Rate: 112 (02/10 2300) Intake/Output from previous day: 02/10 0701 - 02/11 0700 In: 595 [I.V.:595] Out: 550 [Urine:550] Intake/Output from this shift: Total I/O In: -  Out: 250 [Urine:250]  Labs:  Recent Labs  01/11/14 1544 01/11/14 1709  01/12/14 0606 01/12/14 0658  01/12/14 1330 01/12/14 1726 01/13/14 0223  WBC 11.9*  --   --   --   --   --   --   --   --   HGB 15.0 17.3*  --   --   --   --   --   --   --   PLT 321  --   --   --   --   --   --   --   --   LABCREA  --   --   --   --  154.68  --   --   --   --   CREATININE 0.75 1.00  < > 0.84  --   < > 0.83 0.96 0.93  < > = values in this interval not displayed. Estimated Creatinine Clearance: 43.5 ml/min (by C-G formula based on Cr of 0.93). No results found for this basename: Rolm GalaVANCOTROUGH, VANCOPEAK, VANCORANDOM, GENTTROUGH, GENTPEAK, GENTRANDOM, TOBRATROUGH, TOBRAPEAK, TOBRARND, AMIKACINPEAK, AMIKACINTROU, AMIKACIN,  in the last 72 hours   Microbiology: Recent Results (from the past 720 hour(s))  URINE CULTURE     Status: None   Collection Time    01/11/14  7:51 PM      Result Value Ref Range Status   Specimen Description URINE, CATHETERIZED   Final   Special Requests NONE   Final   Culture  Setup Time     Final   Value: 01/11/2014 21:34     [facility]   Colony Count     Final   Value: >=100,000 COLONIES/ML     [facility]formed at [facility]   Culture     Final   Value: GRAM NEGATIVE RODS     Performed at [facility]   Report Status PENDING   Incomplete   CULTURE, BLOOD (ROUTINE X 2)     Status: None   Collection Time    01/12/14  6:00 AM      Result Value Ref Range Status   Specimen Description BLOOD RIGHT HAND   Final   Special Requests BOTTLES DRAWN AEROBIC ONLY 5CC   Final   Culture  Setup Time     Final   Value: 01/12/2014 10:21     Performed at [facility]   Culture     Final   Value: GRAM POSITIVE COCCI IN CLUSTERS     Note: Gram Stain Report Called to,Read Back By and Verified With: Gray BernhardtENNE BROWN [id number] BRMEL     Performed at [facility]   Report Status PENDING   Incomplete  MRSA PCR SCREENING     Status: None   Collection Time    01/12/14  6:35 AM      Result Value Ref Range Status   MRSA by PCR  NEGATIVE  NEGATIVE Final   Comment:            The GeneXpert MRSA Assay (FDA     approved for NASAL specimens     only), is one component of a     comprehensive MRSA colonization     surveillance program. It is not     intended to diagnose MRSA     infection nor to guide or     monitor treatment for     MRSA infections.    Medical History: Past Medical History  Diagnosis Date  . Hyperlipidemia   . Hypertension   . Anxiety   . GERD (gastroesophageal reflux disease)   . Abnormal heart rhythms   . Fatty liver   . Internal hemorrhoid     Medications:  Scheduled:  . aspirin  300 mg Rectal Daily   Or  . aspirin  325 mg Oral Daily  . cefTRIAXone (ROCEPHIN) IVPB 1 gram/50 mL D5W  1 g Intravenous Q24H  . levothyroxine  25 mcg Intravenous Daily  . metoprolol  2.5 mg Intravenous 4 times per day  . pantoprazole (PROTONIX) IV  40 mg Intravenous QHS   Assessment: 80 yo female with 2/2 blood cultures growing GPC for empiric antibiotics  Goal of Therapy:  Vancomycin trough level 15-20 mcg/ml  Plan:  Vancomcyin 1500 mg IV now, then 1 g IV q24h  Eddie Candle 01/13/2014,3:44 AM

## 2014-01-13 NOTE — Progress Notes (Signed)
SLP Cancellation Note  Patient Details Name: Tammy Fernandez MRN: [id number] DOB: 1924/03/18   Cancelled treatment:        Attempted recheck for po readiness.  Pt currently unavailable - nursing procedure ongoing.  PT and OT also awaiting pt availability.  Per RN, pt requesting something to eat. Will continue efforts.   Nneka Blanda B. Murvin NatalBueche, [facility] CenterMSP, CCC-SLP [phone]  Leigh AuroraBueche, Kendallyn Lippold Brown 01/13/2014, 9:43 AM

## 2014-01-13 NOTE — Progress Notes (Signed)
Physical Therapy Treatment Patient Details Name: Tammy Fernandez Abdelaziz MRN: [id number] DOB: Feb 11, 1924 Today's Date: 01/13/2014 Time: 4098-11911004-1029 PT Time Calculation (min): 25 min  PT Assessment / Plan / Recommendation  History of Present Illness Tammy Fernandez Tagle is an 80 y.o. female with a history of hypertension and hyperlipidemia who developed acute onset of inability to speak, gaze to the right and paralysis of left extremities earlier today. Patient was last seen well at 9:30 this morning. She has no previous history of stroke nor TIA. She's been taking aspirin daily. CT scan of her head showed no acute intracranial abnormality. NIH stroke score was 22.   PT Comments   Pt continues to demo some Lt side neglect. Pt had eyes closed throughout session but was able to answer questions inconsistently and accurately. Pt demo difficulty with motor planning/tasks today but demo increased strength on Lt side today. Pt required 2 person (A) for transfers. Continues to be great candidate for CIR.   Follow Up Recommendations  CIR     Does the patient have the potential to tolerate intense rehabilitation     Barriers to Discharge        Equipment Recommendations  None recommended by PT    Recommendations for Other Services Rehab consult  Frequency Min 4X/week   Progress towards PT Goals Progress towards PT goals: Progressing toward goals  Plan Current plan remains appropriate    Precautions / Restrictions Precautions Precautions: Fall Precaution Comments: pt would not open eyes during evaluation therefore difficult to assess neglect. Restrictions Weight Bearing Restrictions: No   Pertinent Vitals/Pain Stable t/o session.     Mobility  Bed Mobility Overal bed mobility: Needs Assistance;+2 for physical assistance Bed Mobility: Rolling;Supine to Sit Rolling: Max assist Supine to sit: Total assist;+2 for physical assistance General bed mobility comments: pt demo decreased motor  planning/sequencing; requires multimodal cues for tasks and incr time; pt was able to hold onto handrail with Rt UE to (A) with transfer to EOB Transfers Overall transfer level: Needs assistance Equipment used: 2 person hand held assist Transfers: Sit to/from UGI CorporationStand;Stand Pivot Transfers Sit to Stand: +2 physical assistance;Mod assist Stand pivot transfers: +2 physical assistance;Total assist General transfer comment: pt demo ability to power up through LEs; requires incr time and facilitation to perform pivotal steps; pt demo decreased ability to motor sequence steps; pt with eyes closed throughout transfer Modified Rankin (Stroke Patients Only) Pre-Morbid Rankin Score: No significant disability Modified Rankin: Severe disability         PT Diagnosis:    PT Problem List:   PT Treatment Interventions:     PT Goals (current goals can now be found in the care plan section) Acute Rehab PT Goals Patient Stated Goal: none stated PT Goal Formulation: With patient/family Time For Goal Achievement: 01/26/14 Potential to Achieve Goals: Good  Visit Information  Last PT Received On: 01/13/14 Assistance Needed: +2 PT/OT/SLP Co-Evaluation/Treatment: Yes Reason for Co-Treatment: For patient/therapist safety;Complexity of the patient's impairments (multi-system involvement) PT goals addressed during session: Mobility/safety with mobility;Balance OT goals addressed during session: ADL's and self-care History of Present Illness: Tammy Fernandez Litzau is an 80 y.o. female with a history of hypertension and hyperlipidemia who developed acute onset of inability to speak, gaze to the right and paralysis of left extremities earlier today. Patient was last seen well at 9:30 this morning. She has no previous history of stroke nor TIA. She's been taking aspirin daily. CT scan of her head showed no acute intracranial abnormality. NIH  stroke score was 22.    Subjective Data  Subjective: pt lying supine; family  present. family reports pt was awake with eyes open yesterday afternoon  Patient Stated Goal: none stated   Cognition  Cognition Arousal/Alertness: Lethargic Behavior During Therapy: Flat affect Overall Cognitive Status: Impaired/Different from baseline Area of Impairment: Orientation;Attention;Memory;Following commands;Safety/judgement;Awareness;Problem solving Orientation Level: Disoriented to;Time;Situation Current Attention Level: Focused Memory: Decreased recall of precautions;Decreased short-term memory Following Commands: Follows one step commands inconsistently Safety/Judgement: Decreased awareness of deficits Awareness: Intellectual Problem Solving: Slow processing;Decreased initiation;Difficulty sequencing;Requires verbal cues;Requires tactile cues General Comments: Pt talked about wanting food and a drink but did not speak of anything else and followed appx 50% of commands.    Balance  Balance Overall balance assessment: Needs assistance;History of Falls Sitting-balance support: Feet supported;Single extremity supported;Bilateral upper extremity supported Sitting balance-[name]: Fair Sitting balance - Comments: pt able to progress to supervision at EOB; unable to tolerate >30 sec at time at S level Postural control: Posterior lean;Right lateral lean Standing balance support: Bilateral upper extremity supported;During functional activity Standing balance-[name]: Zero Standing balance comment: 2 person (A) for standing balance  General Comments General comments (skin integrity, edema, etc.): unable to test any perception/vision/gaze due to pt not opening eyes during entire session.  End of Session PT - End of Session Equipment Utilized During Treatment: Gait belt;Oxygen Activity Tolerance: Patient tolerated treatment well Patient left: in chair;with call bell/phone within reach;with family/visitor present Nurse Communication: Mobility status;Need for lift  equipment   GP     Donell Sievert, [place] [phone] 01/13/2014, 12:51 PM

## 2014-01-13 NOTE — Progress Notes (Signed)
Subjective: No significant events over night.  Son at bedside feels she is moving bilateral arms more than prior.  Was able to get up with PT this AM but showed difficulty with cognitive planning. No seizure activity noted.    Objective: Current vital signs: BP 166/79  Pulse 121  Temp(Src) 98.4 F (36.9 C) (Oral)  Resp 17  Ht 5\' 2"  (1.575 m)  Wt 86.6 kg (190 lb 14.7 oz)  BMI 34.91 kg/m2  SpO2 97% Vital signs in last 24 hours: Temp:  [98.4 F (36.9 C)-103 F (39.4 C)] 98.4 F (36.9 C) (02/11 0700) Pulse Rate:  [100-121] 121 (02/11 0900) Resp:  [17-29] 17 (02/11 0500) BP: (157-207)/(64-126) 166/79 mmHg (02/11 0745) SpO2:  [91 %-98 %] 97 % (02/11 0900)  Intake/Output from previous day: 02/10 0701 - 02/11 0700 In: 2095 [I.V.:1595; IV Piggyback:500] Out: 850 [Urine:850] Intake/Output this shift: Total I/O In: 100 [I.V.:100] Out: -  Nutritional status:    Neurologic Exam: Mental Status: Opens eye to voice but when attempting to look at pupils will clench them shut.  Squeezes my hand bilaterally to verbal command but follows no other commands. Repeatedly reports that she wants to drink.  With draws to pain on the left greater than right.  Cranial Nerves: II:  Blinks to threat bilaterally, pupils equal, round, reactive to light and accommodation III,IV, VI: intermittently opens eye, clenches bilateral eyes shut on formal exam, bilateral eyes deviated to the left and do not cross midline  V,VII: face symmetric, facial light touch sensation normal bilaterally VIII: unable to assess IX,X: gag reflex present XI: unable to assess, head held turned to the right XII: unable to assess  Motor: Lifting bilateral arms antigravity, grips bilateral hands to command, no movement noted in bilateral LE. Tone increased in bilateral UE.  Sensory: winces to pain in bilateral UE and LE Deep Tendon Reflexes:  Right: Upper Extremity   Left: Upper extremity   biceps (C-5 to C-6) 2/4   biceps  (C-5 to C-6) 2/4 tricep (C7) 2/4    triceps (C7) 2/4 Brachioradialis (C6) 2/4  Brachioradialis (C6) 2/4  Lower Extremity Lower Extremity  quadriceps (L-2 to L-4) 1/4   quadriceps (L-2 to L-4) 1/4 Achilles (S1) 0/4   Achilles (S1) 0/4  Plantars: Mute bilaterally     Lab Results: Basic Metabolic Panel:  Recent Labs Lab 01/12/14 0606 01/12/14 0940 01/12/14 1330 01/12/14 1726 01/13/14 0223  NA 127* 125* 124* 123* 125*  K 4.8 4.5 4.7 4.4 4.4  CL 83* 83* 83* 82* 82*  CO2 30 30 28 28 29   GLUCOSE 148* 154* 142* 159* 174*  BUN 18 17 18 23  27*  CREATININE 0.84 0.78 0.83 0.96 0.93  CALCIUM 9.8 9.4 9.4 9.5 9.4  PHOS  --   --   --   --  3.3    Liver Function Tests:  Recent Labs Lab 01/11/14 1544 01/13/14 0223  AST 22  --   ALT 11  --   ALKPHOS 89  --   BILITOT 0.5  --   PROT 8.3  --   ALBUMIN 3.2* 3.0*   No results found for this basename: LIPASE, AMYLASE,  in the last 168 hours No results found for this basename: AMMONIA,  in the last 168 hours  CBC:  Recent Labs Lab 01/11/14 1544 01/11/14 1709  WBC 11.9*  --   NEUTROABS 8.8*  --   HGB 15.0 17.3*  HCT 41.5 51.0*  MCV 84.7  --  PLT 321  --     Cardiac Enzymes:  Recent Labs Lab 01/11/14 1544  TROPONINI <0.30    Lipid Panel:  Recent Labs Lab 01/12/14 0606  CHOL 165  TRIG 79  HDL 38*  CHOLHDL 4.3  VLDL 16  LDLCALC 161*    CBG:  Recent Labs Lab 01/11/14 1545 01/12/14 1233 01/13/14 0025  GLUCAP 162* 150* 99    Microbiology: Results for orders placed during the hospital encounter of 01/11/14  URINE CULTURE     Status: None   Collection Time    01/11/14  7:51 PM      Result Value Ref Range Status   Specimen Description URINE, CATHETERIZED   Final   Special Requests NONE   Final   Culture  Setup Time     Final   Value: 01/11/2014 21:34     Performed at [facility] Count     Final   Value: >=100,000 COLONIES/ML     Performed at [facility]   Culture      Final   Value: GRAM NEGATIVE RODS     Performed at [facility]   Report Status PENDING   Incomplete  CULTURE, BLOOD (ROUTINE X 2)     Status: None   Collection Time    01/12/14  6:00 AM      Result Value Ref Range Status   Specimen Description BLOOD RIGHT HAND   Final   Special Requests BOTTLES DRAWN AEROBIC ONLY 5CC   Final   Culture  Setup Time     Final   Value: 01/12/2014 10:21     Performed at [facility]   Culture     Final   Value: GRAM POSITIVE COCCI IN CLUSTERS     Note: Gram Stain Report Called to,Read Back By and Verified With: Gray Bernhardt [id number] BRMEL     Performed at [facility]   Report Status PENDING   Incomplete  CULTURE, BLOOD (ROUTINE X 2)     Status: None   Collection Time    01/12/14  6:06 AM      Result Value Ref Range Status   Specimen Description BLOOD RIGHT ANTECUBITAL   Final   Special Requests BOTTLES DRAWN AEROBIC ONLY 3CC   Final   Culture  Setup Time     Final   Value: 01/12/2014 10:21     Performed at [facility]   Culture     Final   Value: GRAM POSITIVE COCCI IN CLUSTERS     Note: Gram Stain Report Called to,Read Back By and Verified With: Kerby Moors [id number] BRMEL     Performed at [facility]   Report Status PENDING   Incomplete  MRSA PCR SCREENING     Status: None   Collection Time    01/12/14  6:35 AM      Result Value Ref Range Status   MRSA by PCR NEGATIVE  NEGATIVE Final   Comment:            The GeneXpert MRSA Assay (FDA     approved for NASAL specimens     only), is one component of a     comprehensive MRSA colonization     surveillance program. It is not     intended to diagnose MRSA     infection nor to guide or     monitor treatment for     MRSA infections.  Coagulation Studies:  Recent Labs  01/11/14 1544  LABPROT 13.1  INR 1.01    Imaging: Ct Head Wo Contrast  01/11/2014   CLINICAL DATA:  Left-sided weakness with right-sided gaze and slurred speech.  Hypertension. Hyperlipidemia.  EXAM: CT HEAD WITHOUT CONTRAST  TECHNIQUE: Contiguous axial images were obtained from the base of the skull through the vertex without intravenous contrast.  COMPARISON:  No comparisons available.  FINDINGS: No intracranial hemorrhage.  Question tiny infarct superior left lenticular nucleus of questionable age.  No CT evidence of large acute infarct.  No intracranial mass lesion noted on this unenhanced exam.  No hydrocephalus.  IMPRESSION: No intracranial hemorrhage.  Question tiny infarct superior left lenticular nucleus of questionable age.  No CT evidence of large acute infarct.  No intracranial mass lesion noted on this unenhanced exam.   Electronically Signed   By: Bridgett Larsson M.D.   On: 01/11/2014 16:10   Mr Angiogram Head Wo Contrast  01/11/2014   CLINICAL DATA:  Acute onset of inability to speak, gaze to the right and left paralysis. History of hypertension and hyperlipidemia.  EXAM: MRI HEAD WITHOUT CONTRAST  TECHNIQUE: Multiplanar, multiecho pulse sequences of the brain and surrounding structures were obtained without intravenous contrast. 3D time-of-flight MRA through the circle of Willis with maximum intensity projected reformations.  COMPARISON:  CT of the head January 11, 2014 at 1602 hr  FINDINGS: MRI head: Mild motion degraded examination. No reduced diffusion to suggest acute ischemia. No susceptibility artifact to suggest hemorrhage.  The ventricles and sulci are normal for patient's age. Scattered subcentimeter supratentorial white matter T2 hyperintensities are less than expected for age without midline shift or mass effect. Subtle asymmetric T2 hyperintense signal within the right temporal cortex, axial 8/21, apparent on the axial FLAIR sequence as well though, less conspicuous on the coronal T2.  No abnormal extra-axial fluid collections. Basal cisterns are patent.  Status post bilateral ocular lens implants. Paranasal sinuses and mastoid air cells appear  well-aerated. No abnormal sellar expansion, cerebellar tonsils descend less than 5 mm below the foramen magnum, and do not appear pointed in appearance.  MRA head: Normal flow related enhancement of the cervical internal carotid arteries, with slight signal loss at a tortuous segment of the right cervical internal carotid artery, consistent with anatomical variant. Normal flow related enhancement of the petrous internal carotid arteries. There is a long segment of narrowing of the right cavernous internal carotid artery, less than 50% with normal flow related enhancement supra clinoid internal carotid arteries. The left A1 segment is congenitally dominant, with predominately azygos anterior cerebral artery. The right middle cerebral artery is generally smaller than the left, this may be in part accentuated by tilt within the scanner. There is poor flow early enhancement of the right M3 branches, best seen on the reformations.  Nearly codominant vertebral arteries with normal flow of enhanced in the vertebrobasilar system and main branch vessels. Normal flow related enhancement of posterior cerebral arteries.  No large vessel occlusion, aneurysm, suspicious luminal irregularity within the anterior or posterior circulation. Very mild diffuse irregularity of the intracranial vessels suggests atherosclerosis.  IMPRESSION: MRI of the head: No convincing evidence of reduced diffusion to suggest acute ischemia, however there is mild bright signal within the right temporal cortex, in an area predominately obscured on the diffusion-weighted sequences and, early stroke could have this appearance.  Otherwise unremarkable MRI of the head for age.  MRA of the head: Poor enhancement of right M3 branches concerning for  high-grade stenosis/ possible embolus.  Narrowing of the right carotid siphon without hemodynamically significant stenosis through the circle Willis.  Critical Value/emergent results were called by telephone at the  time of interpretation on 01/11/2014 at 10:31 PM to Dr. Dione Booze , who verbally acknowledged these results.   Electronically Signed   By: Awilda Metro   On: 01/11/2014 22:33   Mr Brain Wo Contrast  01/11/2014   CLINICAL DATA:  Acute onset of inability to speak, gaze to the right and left paralysis. History of hypertension and hyperlipidemia.  EXAM: MRI HEAD WITHOUT CONTRAST  TECHNIQUE: Multiplanar, multiecho pulse sequences of the brain and surrounding structures were obtained without intravenous contrast. 3D time-of-flight MRA through the circle of Willis with maximum intensity projected reformations.  COMPARISON:  CT of the head January 11, 2014 at 1602 hr  FINDINGS: MRI head: Mild motion degraded examination. No reduced diffusion to suggest acute ischemia. No susceptibility artifact to suggest hemorrhage.  The ventricles and sulci are normal for patient's age. Scattered subcentimeter supratentorial white matter T2 hyperintensities are less than expected for age without midline shift or mass effect. Subtle asymmetric T2 hyperintense signal within the right temporal cortex, axial 8/21, apparent on the axial FLAIR sequence as well though, less conspicuous on the coronal T2.  No abnormal extra-axial fluid collections. Basal cisterns are patent.  Status post bilateral ocular lens implants. Paranasal sinuses and mastoid air cells appear well-aerated. No abnormal sellar expansion, cerebellar tonsils descend less than 5 mm below the foramen magnum, and do not appear pointed in appearance.  MRA head: Normal flow related enhancement of the cervical internal carotid arteries, with slight signal loss at a tortuous segment of the right cervical internal carotid artery, consistent with anatomical variant. Normal flow related enhancement of the petrous internal carotid arteries. There is a long segment of narrowing of the right cavernous internal carotid artery, less than 50% with normal flow related enhancement supra  clinoid internal carotid arteries. The left A1 segment is congenitally dominant, with predominately azygos anterior cerebral artery. The right middle cerebral artery is generally smaller than the left, this may be in part accentuated by tilt within the scanner. There is poor flow early enhancement of the right M3 branches, best seen on the reformations.  Nearly codominant vertebral arteries with normal flow of enhanced in the vertebrobasilar system and main branch vessels. Normal flow related enhancement of posterior cerebral arteries.  No large vessel occlusion, aneurysm, suspicious luminal irregularity within the anterior or posterior circulation. Very mild diffuse irregularity of the intracranial vessels suggests atherosclerosis.  IMPRESSION: MRI of the head: No convincing evidence of reduced diffusion to suggest acute ischemia, however there is mild bright signal within the right temporal cortex, in an area predominately obscured on the diffusion-weighted sequences and, early stroke could have this appearance.  Otherwise unremarkable MRI of the head for age.  MRA of the head: Poor enhancement of right M3 branches concerning for high-grade stenosis/ possible embolus.  Narrowing of the right carotid siphon without hemodynamically significant stenosis through the circle Willis.  Critical Value/emergent results were called by telephone at the time of interpretation on 01/11/2014 at 10:31 PM to Dr. Dione Booze , who verbally acknowledged these results.   Electronically Signed   By: Awilda Metro   On: 01/11/2014 22:33   Dg Chest Port 1 View  01/12/2014   CLINICAL DATA:  Fever.  EXAM: PORTABLE CHEST - 1 VIEW  COMPARISON:  NM MYOCAR MULTI w/SPECT w/WALL MOTION / EF dated  08/27/2013  FINDINGS: Patient is rotated to the right. Chronic elevation of the right hemidiaphragm opacity at the right chest. Tortuous thoracic aorta appears similar. Lung volumes are lower than on prior studies. Monitoring leads project over the  chest. There is no gross focal consolidation identified. Cardiomediastinal contours appear similar allowing for rotation.  IMPRESSION: Lower lung volumes with chronic elevation of the right hemidiaphragm. No gross acute cardiopulmonary disease.   Electronically Signed   By: Andreas Newport M.D.   On: 01/12/2014 01:27    Medications:  Scheduled: . aspirin  300 mg Rectal Daily   Or  . aspirin  325 mg Oral Daily  . cefTRIAXone (ROCEPHIN)  IV  2 g Intravenous Q12H  . levothyroxine  25 mcg Intravenous Daily  . metoprolol  5 mg Intravenous 4 times per day  . pantoprazole (PROTONIX) IV  40 mg Intravenous QHS  . [START ON 01/14/2014] vancomycin  1,000 mg Intravenous Q24H    Assessment/Plan: 80 year old female presenting with left sided weakness and eye deviation.  MRI reviewed and shows right cortical abnormalities.  EEG reviewed as well and frontal slowing noted not felt to be epileptiform in nature.  Although patient improved was febrile overnight while on Rocephin.  Differential remains broad and includes stroke as well as herpes encephalitis.  Blood cultures have returned positive also and can not rule out septic emboli.    Recommendations: 1.  Echocardiogram 2.  Acyclovir.  Dosing to be determined by pharmacy.   3.  Agree with sodium correction 4.  Would repeat MRI  Case discussed with Dr. Sharon Seller   LOS: 2 days   Thana Farr, MD [facility] [phone] 01/13/2014  11:05 AM

## 2014-01-13 NOTE — Consult Note (Signed)
[facility]    Date of Admission:  01/11/2014           Day 3 vancomycin        Day 3 ceftriaxone        Day 1 acyclovir       Reason for Consult: Staphylococcal bacteremia    Referring Physician: Dr. Reather Littler  Principal Problem:   Bacteremia due to Staphylococcus Active Problems:   Left hemiplegia   Hypertension   Hyponatremia   Hyperkalemia   Hematuria   UTI (urinary tract infection)   Acute encephalopathy   . acyclovir  10 mg/kg (Ideal) Intravenous Q12H  . aspirin  300 mg Rectal Daily   Or  . aspirin  325 mg Oral Daily  . cefTRIAXone (ROCEPHIN)  IV  2 g Intravenous Q12H  . levothyroxine  25 mcg Intravenous Daily  . metoprolol  5 mg Intravenous 4 times per day  . pantoprazole (PROTONIX) IV  40 mg Intravenous QHS  . [START ON 01/14/2014] vancomycin  1,000 mg Intravenous Q24H    Recommendations: 1. Continue vancomycin 2. Discontinue ceftriaxone and acyclovir  3. Start cefazolin 4. Repeat blood cultures 5. Await results of TTE   Assessment: She has staph bacteremia there certainly a possibility that she has endocarditis and suffered a small shower of septic emboli to the brain causing her stroke syndrome. Also possible that she can have meningitis but I would not favor pursuing lumbar puncture at this time is I doubt that it will change her treatment or outcome. I will treat her with vancomycin and cefazolin pending final blood culture results to cover both MRSA and MSSA. I will also repeat blood cultures and await the results of the TTE. I discussed the situation and my recommendations with her son who is at the bedside.   HPI: Tammy Fernandez is a 80 y.o. female who has had problems with dizziness over the past month. Apparently she has been treated with antibiotics for possible sinusitis. She's also had problems with polydipsia. Over the past week she developed some headache. Her family was unaware if she was having any fever at  home. She developed decreased mental status, garbled speech and left-sided weakness leading to admission 2 days ago. CT scan and MRI did not show clear-cut evidence of a new CVA. She has been febrile here in the hospital both admission blood cultures have grown gram-positive cocci clusters.   Review of Systems: Review of systems not obtained due to patient factors.  Past Medical History  Diagnosis Date  . Hyperlipidemia   . Hypertension   . Anxiety   . GERD (gastroesophageal reflux disease)   . Abnormal heart rhythms   . Fatty liver   . Internal hemorrhoid     History  Substance Use Topics  . Smoking status: Never Smoker   . Smokeless tobacco: Never Used  . Alcohol Use: No    Family History  Problem Relation Age of Onset  . Stroke Father   . Hypertension Father   . Colon cancer Cousin   . Colon cancer      Aunt  . Diabetes      aunt and cousins  . Heart disease      cousins   Allergies  Allergen Reactions  . Crestor [Rosuvastatin Calcium]   . Sulfa Drugs Cross Reactors Rash    OBJECTIVE: Blood pressure 181/74, pulse 121, temperature 98.6 F (37 C), temperature source  Axillary, resp. rate 17, height 5\' 2"  (1.575 m), weight 86.6 kg (190 lb 14.7 oz), SpO2 97.00%. General: She is unresponsive to commands Skin: No rash, splinter or conjunctival hemorrhages Lungs: Clear Cor: Regular S1 and S2 with no murmurs Abdomen: Obese, soft and nontender. Healed lower midline incision Joints and extremities: No acute abnormalities noted  Lab Results Lab Results  Component Value Date   WBC 11.9* 01/11/2014   HGB 17.3* 01/11/2014   HCT 51.0* 01/11/2014   MCV 84.7 01/11/2014   PLT 321 01/11/2014    Lab Results  Component Value Date   CREATININE 0.93 01/13/2014   BUN 27* 01/13/2014   NA 125* 01/13/2014   K 4.4 01/13/2014   CL 82* 01/13/2014   CO2 29 01/13/2014    Lab Results  Component Value Date   ALT 11 01/11/2014   AST 22 01/11/2014   ALKPHOS 89 01/11/2014   BILITOT 0.5 01/11/2014       Microbiology: Recent Results (from the past 240 hour(s))  URINE CULTURE     Status: None   Collection Time    01/11/14  7:51 PM      Result Value Ref Range Status   Specimen Description URINE, CATHETERIZED   Final   Special Requests NONE   Final   Culture  Setup Time     Final   Value: 01/11/2014 21:34     [facility]formed at [facility]   Colony Count     Final   Value: >=100,000 COLONIES/ML     Performed at [facility]   Culture     Final   Value: GRAM NEGATIVE RODS     Performed at [facility]   Report Status PENDING   Incomplete  CULTURE, BLOOD (ROUTINE X 2)     Status: None   Collection Time    01/12/14  6:00 AM      Result Value Ref Range Status   Specimen Description BLOOD RIGHT HAND   Final   Special Requests BOTTLES DRAWN AEROBIC ONLY 5CC   Final   Culture  Setup Time     Final   Value: 01/12/2014 10:21     Performed at [facility]   Culture     Final   Value: GRAM POSITIVE COCCI IN CLUSTERS     Note: Gram Stain Report Called to,Read Back By and Verified With: Gray BernhardtENNE BROWN [id number] BRMEL     Performed at [facility]   Report Status PENDING   Incomplete  CULTURE, BLOOD (ROUTINE X 2)     Status: None   Collection Time    01/12/14  6:06 AM      Result Value Ref Range Status   Specimen Description BLOOD RIGHT ANTECUBITAL   Final   Special Requests BOTTLES DRAWN AEROBIC ONLY 3CC   Final   Culture  Setup Time     Final   Value: 01/12/2014 10:21     Performed at [facility]   Culture     Final   Value: GRAM POSITIVE COCCI IN CLUSTERS     Note: Gram Stain Report Called to,Read Back By and Verified With: Kerby MoorsENEE BROWN [id number] BRMEL     Performed at [facility]   Report Status PENDING   Incomplete  MRSA PCR SCREENING     Status: None   Collection Time    01/12/14  6:35 AM      Result Value Ref Range Status   MRSA by  PCR NEGATIVE  NEGATIVE Final   Comment:            The GeneXpert MRSA Assay (FDA      approved for NASAL specimens     only), is one component of a     comprehensive MRSA colonization     surveillance program. It is not     intended to diagnose MRSA     infection nor to guide or     monitor treatment for     MRSA infections.    Cliffton Asters, MD [facility] [phone] pager   [phone] cell 01/13/2014, 2:02 PM

## 2014-01-14 ENCOUNTER — Inpatient Hospital Stay (HOSPITAL_COMMUNITY): Payer: PRIVATE HEALTH INSURANCE

## 2014-01-14 DIAGNOSIS — A498 Other bacterial infections of unspecified site: Secondary | ICD-10-CM

## 2014-01-14 DIAGNOSIS — J9600 Acute respiratory failure, unspecified whether with hypoxia or hypercapnia: Secondary | ICD-10-CM

## 2014-01-14 LAB — BASIC METABOLIC PANEL
BUN: 24 mg/dL — AB (ref 6–23)
CO2: 26 mEq/L (ref 19–32)
Calcium: 9.4 mg/dL (ref 8.4–10.5)
Chloride: 86 mEq/L — ABNORMAL LOW (ref 96–112)
Creatinine, Ser: 0.66 mg/dL (ref 0.50–1.10)
GFR, EST AFRICAN AMERICAN: 89 mL/min — AB (ref >90)
GFR, EST NON AFRICAN AMERICAN: 77 mL/min — AB (ref >90)
Glucose, Bld: 130 mg/dL — ABNORMAL HIGH (ref 70–99)
POTASSIUM: 4.2 meq/L (ref 3.7–5.3)
Sodium: 127 mEq/L — ABNORMAL LOW (ref 137–147)

## 2014-01-14 LAB — CBC
HCT: 35.7 % — ABNORMAL LOW (ref 36.0–46.0)
Hemoglobin: 12.2 g/dL (ref 12.0–15.0)
MCH: 29.9 pg (ref 26.0–34.0)
MCHC: 34.2 g/dL (ref 30.0–36.0)
MCV: 87.5 fL (ref 78.0–100.0)
Platelets: 290 10*3/uL (ref 150–400)
RBC: 4.08 MIL/uL (ref 3.87–5.11)
RDW: 12.9 % (ref 11.5–15.5)
WBC: 24.9 10*3/uL — ABNORMAL HIGH (ref 4.0–10.5)

## 2014-01-14 MED ORDER — METOPROLOL TARTRATE 1 MG/ML IV SOLN
2.5000 mg | INTRAVENOUS | Status: AC
  Administered 2014-01-14: 2.5 mg via INTRAVENOUS

## 2014-01-14 MED ORDER — GADOBENATE DIMEGLUMINE 529 MG/ML IV SOLN
15.0000 mL | Freq: Once | INTRAVENOUS | Status: AC
  Administered 2014-01-14: 15 mL via INTRAVENOUS

## 2014-01-14 MED ORDER — FUROSEMIDE 10 MG/ML IJ SOLN
20.0000 mg | Freq: Every day | INTRAMUSCULAR | Status: DC
  Administered 2014-01-14: 10 mg via INTRAVENOUS
  Administered 2014-01-15 – 2014-01-18 (×4): 20 mg via INTRAVENOUS
  Filled 2014-01-14 (×4): qty 2

## 2014-01-14 MED ORDER — FUROSEMIDE 10 MG/ML IJ SOLN
INTRAMUSCULAR | Status: AC
  Filled 2014-01-14: qty 4

## 2014-01-14 MED ORDER — JEVITY 1.2 CAL PO LIQD
1000.0000 mL | ORAL | Status: DC
  Administered 2014-01-14: 23:00:00
  Filled 2014-01-14: qty 1000

## 2014-01-14 NOTE — Progress Notes (Signed)
Progress Note [place] TEAM 1 - Stepdown/ICU TEAM   Hollie BeachRosa L Decarlo ZOX:[id number] DOB: 1924/10/02 DOA: 01/11/2014 PCP: [name],[name], MD  Brief narrative: 80 y.o. female presenting on 01/11/2014 with a past medical history of Hyperlipidemia; Hypertension; Anxiety; GERD (gastroesophageal reflux disease); Abnormal heart rhythms; Fatty liver; and Internal hemorrhoid who was noted by family to be nonverbal, looking to the right and not moving her left side at 2:30 PM after last being seen normal at 10 AM. CT head in ER was negative. She was also noted to have a low sodium and a UTI.   Subjective: More awake today but still noted with left-sided neglect and some difficulty following commands and weak to verbalize. Family states earlier patient was able to tell them she was hungry and wanted a banana.  Assessment/Plan:  Left side weakness/encephalopathy - MRI equivocal but not definitive in r/o CVA- Neuro concerned re: presenting sx's /repeat MRI pending -also agree that with concurrent bacteremia that septic emboli a possibility-repeat TTE pending note had TTE 01/10/2014 -Neuro treating as possible meningitis-ID does not favor pursuit of LP since would not change tx outcome  Sepsis due to E Coli UTI and coag-negative staph bacteremia -cont Rocephin but with possibility of concurrent meningitis will increase dose to 2 gm Q12 -Both bottles blood cultures positive for coag-negative Staphylococcus/assistive is pending-recent issues with persistent sinusitis prior to admission so this could be source -essentially pan sensitive e coli (resistant to amp) -appreciate ID assistance-likely can narrow antibiotics soon -normotensive but still with tachycardia and had temp spike to 103 on 2/10 -No evidence of valvular vegetation on echocardiogram 01/12/2014 -may need TEE-I have canceled echocardiogram ordered 01/13/2014   Acute respiratory failure with hypoxia -seems r/t hypoventilation and  AMS -no focal infiltrate on CXR    Hypertension - cont IV Metoprolol-dose increased  2/11    Hyponatremia - appears to have a chronic component upon discussion with family - seems dehydrated and sodium slowly improving with fluids - appreciate nephro consult- serum cortisol normal - Nephro feels now euvolemic and initiated fluid restriction 2/11- started Lasix 2/12    Hyperkalemia - resolved    Hematuria - likely due to UTI    Low TSH -  Free T4 slightly elevated so suspect sick euthyroid syndrome - cont usual dose Levothyroxine via IV -repeat TSH 8-12 weeks post dc    Code Status: Full code Family Communication: with son at bedside and over the phone with daughter to advise that an NG will be needed- she and her family are in agreement for this Disposition Plan: follow in SDU   Consultants: neuro  Procedures: TTE 2/10 Left ventricle: The cavity size was normal. There was mild concentric hypertrophy. Systolic function was normal. The estimated ejection fraction was in the range of 55% to 60%. Wall motion was normal; there were no regional wall motion abnormalities. Doppler parameters are consistent with abnormal left ventricular relaxation (grade 1 diastolic dysfunction). - No cardiac source of embolism was identified, but cannot be ruled out on the basis of this examination. 1. Consider transesophageal echocardiography if clinically indicated. 2. Consider transesophageal echocardiography if clinically indicated in order to exclude intracardiac thrombus.    Antibiotics: Ceftriaxone 2/9 >>> Vancomycin 2/10 >>> Acyclovir 2/11 >>>  DVT prophylaxis: SCDs  Objective: Filed Weights   01/11/14 2230  Weight: 190 lb 14.7 oz (86.6 kg)   Blood pressure 148/72, pulse 99, temperature 98.2 F (36.8 C), temperature source Axillary, resp. rate 15, height 5\' 2"  (1.575 m), weight  190 lb 14.7 oz (86.6 kg), SpO2 100.00%.  Intake/Output Summary (Last 24 hours) at 01/14/14  1317 Last data filed at 01/14/14 1146  Gross per 24 hour  Intake      0 ml  Output   1100 ml  Net  -1100 ml     Exam: General: No acute respiratory distress-much less lethargic Lungs: Clear to auscultation bilaterally without wheezes or crackles- 1 L Cardiovascular: Regular rate and rhythm without murmur gallop or rub normal S1 and S2 Abdomen: Nontender, nondistended, soft, bowel sounds positive, no rebound, no ascites, no appreciable mass Extremities: No significant cyanosis, clubbing, or edema bilateral lower extremities Neuro: left sided neglect, left facial droop, left arm and leg 0/5. Right 5/5- increased tone in left leg  Data Reviewed: Basic Metabolic Panel:  Recent Labs Lab 01/12/14 0940 01/12/14 1330 01/12/14 1726 01/13/14 0223 01/14/14 0239  NA 125* 124* 123* 125* 127*  K 4.5 4.7 4.4 4.4 4.2  CL 83* 83* 82* 82* 86*  CO2 30 28 28 29 26   GLUCOSE 154* 142* 159* 174* 130*  BUN 17 18 23  27* 24*  CREATININE 0.78 0.83 0.96 0.93 0.66  CALCIUM 9.4 9.4 9.5 9.4 9.4  PHOS  --   --   --  3.3  --    Liver Function Tests:  Recent Labs Lab 01/11/14 1544 01/13/14 0223  AST 22  --   ALT 11  --   ALKPHOS 89  --   BILITOT 0.5  --   PROT 8.3  --   ALBUMIN 3.2* 3.0*   No results found for this basename: LIPASE, AMYLASE,  in the last 168 hours No results found for this basename: AMMONIA,  in the last 168 hours  CBC:  Recent Labs Lab 01/11/14 1544 01/11/14 1709 01/14/14 0239  WBC 11.9*  --  24.9*  NEUTROABS 8.8*  --   --   HGB 15.0 17.3* 12.2  HCT 41.5 51.0* 35.7*  MCV 84.7  --  87.5  PLT 321  --  290   Cardiac Enzymes:  Recent Labs Lab 01/11/14 1544  TROPONINI <0.30   BNP (last 3 results) No results found for this basename: PROBNP,  in the last 8760 hours CBG:  Recent Labs Lab 01/11/14 1545 01/12/14 1233 01/13/14 0025  GLUCAP 162* 150* 99    Recent Results (from the past 240 hour(s))  URINE CULTURE     Status: None   Collection Time     01/11/14  7:51 PM      Result Value Ref Range Status   Specimen Description URINE, CATHETERIZED   Final   Special Requests NONE   Final   Culture  Setup Time     Final   Value: 01/11/2014 21:34     Performed at [facility] Count     Final   Value: >=100,000 COLONIES/ML     Performed at [facility]   Culture     Final   Value: ESCHERICHIA COLI     Performed at [facility]   Report Status 01/13/2014 FINAL   Final   Organism ID, Bacteria ESCHERICHIA COLI   Final  CULTURE, BLOOD (ROUTINE X 2)     Status: None   Collection Time    01/12/14  6:00 AM      Result Value Ref Range Status   Specimen Description BLOOD RIGHT HAND   Final   Special Requests BOTTLES DRAWN AEROBIC ONLY 5CC   Final  Culture  Setup Time     Final   Value: 01/12/2014 10:21     Performed at [facility]   Culture     Final   Value: STAPHYLOCOCCUS SPECIES (COAGULASE NEGATIVE)     Note: Gram Stain Report Called to,Read Back By and Verified With: Gray Bernhardt [id number] BRMEL     Performed at [facility]   Report Status PENDING   Incomplete  CULTURE, BLOOD (ROUTINE X 2)     Status: None   Collection Time    01/12/14  6:06 AM      Result Value Ref Range Status   Specimen Description BLOOD RIGHT ANTECUBITAL   Final   Special Requests BOTTLES DRAWN AEROBIC ONLY 3CC   Final   Culture  Setup Time     Final   Value: 01/12/2014 10:21     Performed at [facility]   Culture     Final   Value: STAPHYLOCOCCUS SPECIES (COAGULASE NEGATIVE)     Note: RIFAMPIN AND GENTAMICIN SHOULD NOT BE USED AS SINGLE DRUGS FOR TREATMENT OF STAPH INFECTIONS.     Note: Gram Stain Report Called to,Read Back By and Verified With: Kerby Moors [id number] BRMEL     Performed at [facility]   Report Status PENDING   Incomplete  MRSA PCR SCREENING     Status: None   Collection Time    01/12/14  6:35 AM      Result Value Ref Range Status   MRSA by PCR NEGATIVE   NEGATIVE Final   Comment:            The GeneXpert MRSA Assay (FDA     approved for NASAL specimens     only), is one component of a     comprehensive MRSA colonization     surveillance program. It is not     intended to diagnose MRSA     infection nor to guide or     monitor treatment for     MRSA infections.     Studies:  Recent x-ray studies have been reviewed in detail by the Attending Physician  Scheduled Meds:  Scheduled Meds: . aspirin  300 mg Rectal Daily   Or  . aspirin  325 mg Oral Daily  .  ceFAZolin (ANCEF) IV  2 g Intravenous 3 times per day  . furosemide  20 mg Intravenous Daily  . levothyroxine  25 mcg Intravenous Daily  . metoprolol  5 mg Intravenous 4 times per day  . pantoprazole (PROTONIX) IV  40 mg Intravenous QHS  . vancomycin  1,000 mg Intravenous Q24H    Time spent on care of this patient: 35 min   ELLIS,ALLISON L., ANP  [facility] Office  [phone] Pager - Text Page per Loretha Stapler as per below:  On-Call/Text Page:      [email or web address]      password [email or web address]  If 7PM-7AM, please contact night-coverage [email or web address] Password [email or web address] 01/14/2014, 1:17 PM   LOS: 3 days      I have examined the patient, reviewed the chart and modified the above note which I agree with.   Suyash Amory,MD [phone] 01/14/2014, 3:57 PM

## 2014-01-14 NOTE — Progress Notes (Signed)
Physical Therapy Treatment Patient Details Name: Tammy Fernandez MRN: [id number] DOB: Feb 20, 1924 Today's Date: 01/14/2014 Time: [id number] PT Time Calculation (min): 24 min  PT Assessment / Plan / Recommendation  History of Present Illness Tammy Fernandez is an 80 y.o. female with a history of hypertension and hyperlipidemia who developed acute onset of inability to speak, gaze to the right and paralysis of left extremities earlier today. Patient was last seen well at 9:30 this morning. She has no previous history of stroke nor TIA. She's been taking aspirin daily. CT scan of her head showed no acute intracranial abnormality. NIH stroke score was 22.   PT Comments   Pt more awake today throughout session. Pt conversant throughout session but not purposeful or able to answer questions appropriately today. Pt with increased gaze to Lt today and demo decreased ability to track to Rt. Pt demo more use of Lt UE and LE but was purposeful <50% of time. Requires 2+ (A) for SPT and has difficulty shifting weight for pivotal steps. Will cont to follow and recommend CIR. Family very supportive and involved throughout session.   Follow Up Recommendations  CIR     Does the patient have the potential to tolerate intense rehabilitation     Barriers to Discharge        Equipment Recommendations  None recommended by PT    Recommendations for Other Services Rehab consult  Frequency Min 4X/week   Progress towards PT Goals Progress towards PT goals: Progressing toward goals  Plan Current plan remains appropriate    Precautions / Restrictions Precautions Precautions: Fall Precaution Comments: eyes open; difficulty tracking and looking Rt today Restrictions Weight Bearing Restrictions: No   Pertinent Vitals/Pain Stable t/o session.     Mobility  Bed Mobility Overal bed mobility: Needs Assistance;+2 for physical assistance Bed Mobility: Supine to Sit Supine to sit: Total assist;+2 for physical  assistance General bed mobility comments: use of helicopter technique to bring pt to sitting position; pt initated movement only with Rt UE on handrail; max multidirectional cues  Transfers Overall transfer level: Needs assistance Equipment used: 2 person hand held assist Transfers: Sit to/from Stand;Stand Pivot Transfers Sit to Stand: +2 physical assistance;Max assist Stand pivot transfers: +2 physical assistance;+2 safety/equipment;From elevated surface;Max assist General transfer comment: pt with decreased participation in transfer today; required max (A) with use of gt belt and draw pad to clear bed and rise to standing position; max facilitation to advance Lt LE for pivotal step  Modified Rankin (Stroke Patients Only) Pre-Morbid Rankin Score: No significant disability Modified Rankin: Severe disability         PT Diagnosis:    PT Problem List:   PT Treatment Interventions:     PT Goals (current goals can now be found in the care plan section) Acute Rehab PT Goals Patient Stated Goal: none stated PT Goal Formulation: With patient/family Time For Goal Achievement: 01/26/14 Potential to Achieve Goals: Good  Visit Information  Last PT Received On: 01/14/14 Assistance Needed: +2 History of Present Illness: Tammy Fernandez is an 80 y.o. female with a history of hypertension and hyperlipidemia who developed acute onset of inability to speak, gaze to the right and paralysis of left extremities earlier today. Patient was last seen well at 9:30 this morning. She has no previous history of stroke nor TIA. She's been taking aspirin daily. CT scan of her head showed no acute intracranial abnormality. NIH stroke score was 22.    Subjective Data  Subjective:  pt lying supine with eyes open today; was making conversation but was confused and seeemd to be just conversing a loud; no carrying on conversationg  Patient Stated Goal: none stated   Cognition  Cognition Arousal/Alertness:  Awake/alert Behavior During Therapy: Restless Overall Cognitive Status: Impaired/Different from baseline Area of Impairment: Orientation;Attention;Memory;Following commands;Safety/judgement;Awareness;Problem solving Orientation Level: Disoriented to;Person;Place;Time;Situation Current Attention Level: Sustained Memory: Decreased recall of precautions;Decreased short-term memory Following Commands: Follows one step commands inconsistently;Follows one step commands with increased time Safety/Judgement: Decreased awareness of deficits Awareness: Intellectual Problem Solving: Slow processing;Decreased initiation;Difficulty sequencing;Requires verbal cues;Requires tactile cues General Comments: pt conversing throughout session; called everyone "momma"; followed simple commands less than 25% of the time today    Balance  Balance Overall balance assessment: Needs assistance Sitting-balance support: Feet supported;Single extremity supported Sitting balance-[name]: Poor Sitting balance - Comments: max (A) to psoition hands to hold herself into sitting position; progressed with max cues to supervision with sitting EOB; tolerated ~10 min Postural control: Posterior lean;Left lateral lean Standing balance support: During functional activity;Bilateral upper extremity supported Standing balance-[name]: Zero Standing balance comment: requires 2+ max (A) to maintain balance  General Comments General comments (skin integrity, edema, etc.): encouraged family to sit on Rt side of pt this afternoon to encourage cervical rotation and Rt gaze today  End of Session PT - End of Session Equipment Utilized During Treatment: Gait belt;Oxygen Activity Tolerance: Patient tolerated treatment well Patient left: in chair;with call bell/phone within reach;with family/visitor present Nurse Communication: Mobility status;Need for lift equipment   GP     Donell Sievert, [place] [phone] 01/14/2014, 5:29  PM

## 2014-01-14 NOTE — Progress Notes (Signed)
Subjective:  Possibly improving but still quite debilitated- BP high- now concern is for endocarditis- ID involved.  Sodium definitely seems to be trending better, latest urine osm 600's  Objective Vital signs in last 24 hours: Filed Vitals:   01/13/14 2317 01/14/14 0343 01/14/14 0700 01/14/14 0750  BP: 158/67 168/78  164/62  Pulse: 95 105  98  Temp: 98.5 F (36.9 C) 98.7 F (37.1 C) 98.1 F (36.7 C)   TempSrc: Axillary Axillary Axillary   Resp: 21 16  22   Height:      Weight:      SpO2: 100% 99%  99%   Weight change:   Intake/Output Summary (Last 24 hours) at 01/14/14 0454 Last data filed at 01/14/14 0700  Gross per 24 hour  Intake      0 ml  Output    950 ml  Net   -950 ml    Assessment/ Plan: Pt is a 80 y.o. yo female who was admitted on 01/11/2014 with probable acute CVA with hyponatremia and UTI , now bacteremia as well Assessment/Plan: 1. Hyponatremia- initially responded well to only NS-   Suspect initial presentation was volume depletion but also SIADH.  TSH if anything overcontrolled, cortisol is high so these are not responsible. High urine osm in the setting of euvolemia confirms SIADH.  Is on fluid restriction and will also add low dose loop diuretic to dilute urine some. Sodium trending better- would aim for around 130 or the low 130's not sure we will get better than that 2. HTN.vol- now hypertensive- no IVF- add low dose loop diuretic- increased lopressor  3. Anemia- not an issue 4. Fever and bacteremia- ID on board, on ancef and vanc 5. Dispo - slow improvement and has a ways to go, looking at [facility]    Labs: Basic Metabolic Panel:  Recent Labs Lab 01/12/14 1726 01/13/14 0223 01/14/14 0239  NA 123* 125* 127*  K 4.4 4.4 4.2  CL 82* 82* 86*  CO2 28 29 26   GLUCOSE 159* 174* 130*  BUN 23 27* 24*  CREATININE 0.96 0.93 0.66  CALCIUM 9.5 9.4 9.4  PHOS  --  3.3  --    Liver Function Tests:  Recent Labs Lab 01/11/14 1544  01/13/14 0223  AST 22  --   ALT 11  --   ALKPHOS 89  --   BILITOT 0.5  --   PROT 8.3  --   ALBUMIN 3.2* 3.0*   No results found for this basename: LIPASE, AMYLASE,  in the last 168 hours No results found for this basename: AMMONIA,  in the last 168 hours CBC:  Recent Labs Lab 01/11/14 1544 01/11/14 1709 01/14/14 0239  WBC 11.9*  --  24.9*  NEUTROABS 8.8*  --   --   HGB 15.0 17.3* 12.2  HCT 41.5 51.0* 35.7*  MCV 84.7  --  87.5  PLT 321  --  290   Cardiac Enzymes:  Recent Labs Lab 01/11/14 1544  TROPONINI <0.30   CBG:  Recent Labs Lab 01/11/14 1545 01/12/14 1233 01/13/14 0025  GLUCAP 162* 150* 99    Iron Studies: No results found for this basename: IRON, TIBC, TRANSFERRIN, FERRITIN,  in the last 72 hours Studies/Results: No results found. Medications: Infusions:    Scheduled Medications: . aspirin  300 mg Rectal Daily   Or  . aspirin  325 mg Oral Daily  .  ceFAZolin (ANCEF) IV  2 g Intravenous 3 times per  day  . levothyroxine  25 mcg Intravenous Daily  . metoprolol  5 mg Intravenous 4 times per day  . pantoprazole (PROTONIX) IV  40 mg Intravenous QHS  . vancomycin  1,000 mg Intravenous Q24H    have reviewed scheduled and prn medications.  Physical Exam: General: left neglect, one word to moan responses Heart: tachy Lungs: poor effort Abdomen: distended Extremities: mild edema    01/14/2014,9:18 AM  LOS: 3 days

## 2014-01-14 NOTE — Clinical Social Work Placement (Signed)
Clinical Social Work Department CLINICAL SOCIAL WORK PLACEMENT NOTE 01/14/2014  Patient:  Tammy Fernandez,Tammy Fernandez  Account Number:  [id number] Admit date:  01/11/2014  Clinical Social Worker:  Cherre BlancJOSEPH BRYANT Emika Tiano, [place]  Date/time:  01/13/2014 02:56 PM  Clinical Social Work is seeking post-discharge placement for this patient at the following level of care:   SKILLED NURSING   (*CSW will update this form in Epic as items are completed)   01/12/2014  [facility]family [facility] Department of Clinical Social Work's list of facilities offering this level of care within the geographic area requested by the patient (or if unable, by the patient's family).  01/12/2014  Patient/family informed of their freedom to choose among providers that offer the needed level of care, that participate in Medicare, Medicaid or managed care program needed by the patient, have an available bed and are willing to accept the patient.  01/12/2014  Patient/family informed of [facility]' ownership interest in [facility], as well as of the fact that they are under no obligation to receive care at this facility.  PASARR submitted to EDS on 01/13/2014 PASARR number received from EDS on 01/13/2014  FL2 transmitted to all facilities in geographic area requested by pt/family on  01/13/2014 FL2 transmitted to all facilities within larger geographic area on   Patient informed that his/her managed care company has contracts with or will negotiate with  certain facilities, including the following:     Patient/family informed of bed offers received:  01/14/2014 Patient chooses bed at  Physician recommends and patient chooses bed at    Patient to be transferred to  on   Patient to be transferred to facility by   The following physician request were entered in Epic:   Additional Comments:   Roddie McBryant Keiasia Christianson, [place] LionsLCSWA, LCASA, [phone]

## 2014-01-14 NOTE — Progress Notes (Addendum)
Subjective: No events over night.  She is more alert today and able to follow simple commands such as reaching out to touch my finger and then touching her nose.  MRI and TTE pending.   Sodium is now 127 WBC increased to 24.9  Infectious disease on board ID has d/c'd Acyclovir  Objective: Current vital signs: BP 164/62  Pulse 98  Temp(Src) 98.1 F (36.7 C) (Axillary)  Resp 22  Ht 5\' 2"  (1.575 m)  Wt 86.6 kg (190 lb 14.7 oz)  BMI 34.91 kg/m2  SpO2 99% Vital signs in last 24 hours: Temp:  [98.1 F (36.7 C)-98.7 F (37.1 C)] 98.1 F (36.7 C) (02/12 0700) Pulse Rate:  [92-105] 98 (02/12 0750) Resp:  [16-22] 22 (02/12 0750) BP: (155-181)/(60-78) 164/62 mmHg (02/12 0750) SpO2:  [99 %-100 %] 99 % (02/12 0750)  Intake/Output from previous day: 02/11 0701 - 02/12 0700 In: 100 [I.V.:100] Out: 950 [Urine:950] Intake/Output this shift:   Nutritional status:    Neurologic Exam: Mental Status: Alert, follows simple commands, able to to reach out and grab my hand with right arm and then touch her nose.  Will repeat what I asked her to do but unable to tell me where she is, date or year.  Cranial Nerves: II: blinks to threat bilaterally, pupils equal, round, reactive to light and accommodation III,IV, VI: ptosis not present, extra-ocular motions intact bilaterally but has a left gaze preference V,VII: facial droop , facial light touch sensation normal bilaterally VIII: hearing normal bilaterally IX,X: gag reflex present XI: not tested XII: midline tongue   Motor: Able to hold bilateral UE and LE antigravity, the right arm and leg are stronger than the left.  Tone remains increased bilaterally Sensory: Pinprick and light touch intact throughout, bilaterally Deep Tendon Reflexes:  Right: Upper Extremity   Left: Upper extremity   biceps (C-5 to C-6) 2/4   biceps (C-5 to C-6) 2/4 tricep (C7) 2/4    triceps (C7) 2/4 Brachioradialis (C6) 2/4  Brachioradialis (C6) 2/4  Lower  Extremity Lower Extremity  quadriceps (L-2 to L-4) 1/4   quadriceps (L-2 to L-4) 1/4 Achilles (S1) 0/4   Achilles (S1) 0/4  Plantars: Mute bilaterally   Lab Results: Basic Metabolic Panel:  Recent Labs Lab 01/12/14 0940 01/12/14 1330 01/12/14 1726 01/13/14 0223 01/14/14 0239  NA 125* 124* 123* 125* 127*  K 4.5 4.7 4.4 4.4 4.2  CL 83* 83* 82* 82* 86*  CO2 30 28 28 29 26   GLUCOSE 154* 142* 159* 174* 130*  BUN 17 18 23  27* 24*  CREATININE 0.78 0.83 0.96 0.93 0.66  CALCIUM 9.4 9.4 9.5 9.4 9.4  PHOS  --   --   --  3.3  --     Liver Function Tests:  Recent Labs Lab 01/11/14 1544 01/13/14 0223  AST 22  --   ALT 11  --   ALKPHOS 89  --   BILITOT 0.5  --   PROT 8.3  --   ALBUMIN 3.2* 3.0*   No results found for this basename: LIPASE, AMYLASE,  in the last 168 hours No results found for this basename: AMMONIA,  in the last 168 hours  CBC:  Recent Labs Lab 01/11/14 1544 01/11/14 1709 01/14/14 0239  WBC 11.9*  --  24.9*  NEUTROABS 8.8*  --   --   HGB 15.0 17.3* 12.2  HCT 41.5 51.0* 35.7*  MCV 84.7  --  87.5  PLT 321  --  290  Cardiac Enzymes:  Recent Labs Lab 01/11/14 1544  TROPONINI <0.30    Lipid Panel:  Recent Labs Lab 01/12/14 0606  CHOL 165  TRIG 79  HDL 38*  CHOLHDL 4.3  VLDL 16  LDLCALC 161*    CBG:  Recent Labs Lab 01/11/14 1545 01/12/14 1233 01/13/14 0025  GLUCAP 162* 150* 99    Microbiology: Results for orders placed during the hospital encounter of 01/11/14  URINE CULTURE     Status: None   Collection Time    01/11/14  7:51 PM      Result Value Ref Range Status   Specimen Description URINE, CATHETERIZED   Final   Special Requests NONE   Final   Culture  Setup Time     Final   Value: 01/11/2014 21:34     Performed at [facility] Count     Final   Value: >=100,000 COLONIES/ML     Performed at [facility]   Culture     Final   Value: ESCHERICHIA COLI     Performed at [facility]   Report Status 01/13/2014 FINAL   Final   Organism ID, Bacteria ESCHERICHIA COLI   Final  CULTURE, BLOOD (ROUTINE X 2)     Status: None   Collection Time    01/12/14  6:00 AM      Result Value Ref Range Status   Specimen Description BLOOD RIGHT HAND   Final   Special Requests BOTTLES DRAWN AEROBIC ONLY 5CC   Final   Culture  Setup Time     Final   Value: 01/12/2014 10:21     Performed at [facility]   Culture     Final   Value: GRAM POSITIVE COCCI IN CLUSTERS     Note: Gram Stain Report Called to,Read Back By and Verified With: Gray Bernhardt [id number] BRMEL     Performed at [facility]   Report Status PENDING   Incomplete  CULTURE, BLOOD (ROUTINE X 2)     Status: None   Collection Time    01/12/14  6:06 AM      Result Value Ref Range Status   Specimen Description BLOOD RIGHT ANTECUBITAL   Final   Special Requests BOTTLES DRAWN AEROBIC ONLY 3CC   Final   Culture  Setup Time     Final   Value: 01/12/2014 10:21     Performed at [facility]   Culture     Final   Value: GRAM POSITIVE COCCI IN CLUSTERS     Note: Gram Stain Report Called to,Read Back By and Verified With: Kerby Moors [id number] BRMEL     Performed at [facility]   Report Status PENDING   Incomplete  MRSA PCR SCREENING     Status: None   Collection Time    01/12/14  6:35 AM      Result Value Ref Range Status   MRSA by PCR NEGATIVE  NEGATIVE Final   Comment:            The GeneXpert MRSA Assay (FDA     approved for NASAL specimens     only), is one component of a     comprehensive MRSA colonization     surveillance program. It is not     intended to diagnose MRSA     infection nor to guide or     monitor treatment for     MRSA infections.  Coagulation Studies:  Recent Labs  01/11/14 1544  LABPROT 13.1  INR 1.01    Imaging: No results found.  Medications:  Scheduled: . aspirin  300 mg Rectal Daily   Or  . aspirin  325 mg Oral Daily  .   ceFAZolin (ANCEF) IV  2 g Intravenous 3 times per day  . furosemide  20 mg Intravenous Daily  . levothyroxine  25 mcg Intravenous Daily  . metoprolol  5 mg Intravenous 4 times per day  . pantoprazole (PROTONIX) IV  40 mg Intravenous QHS  . vancomycin  1,000 mg Intravenous Q24H    Assessment/Plan: 80 year old female presenting with a hemiparesis.  Etiology unclear .  Initial MRI findings questionable.  Patient on ASA.  Infectious diseases felt Acyclovir and Ceftriaxone unnecessary.  Patient now on Cefazolin.  Echocardiogram unremarkable.    Recommendations: 1.  Repeat MRI today.      LOS: 3 days   Thana Farr, MD [facility] [phone]  01/14/2014  9:41 AM

## 2014-01-14 NOTE — Progress Notes (Signed)
Speech Language Pathology Treatment: Dysphagia;Cognitive-Linquistic  Patient Details Name: Tammy Fernandez MRN: [id number] DOB: Mar 31, 1924 Today's Date: 01/14/2014 Time: [id number] SLP Time Calculation (min): 33 min  Assessment / Plan / Recommendation Clinical Impression  Pt seen for f/u dysphagia and cognitive treatment. Pt's level of alertness fluctuated throughout session, however pt overall was lethargic, requiring Total A to follow one-step commands. SpO2 fluctuating throughout session from 87-92% on nasal cannula. Pt required Max cues for orientation to location. Pt verbalizing intermittently at the word to phrase level, however speech was confused and not on topic for conversation. SLP presented empty spoon with intermittent labial opening for spoon to enter, however with forward lingual protrusion pushing the spoon back out. SLP provided Total A for pt to hold spoon and attempt self-feeding, however results were unchanged. Continue to recommend NPO status with consideration of alternative means for nutritional support. Family was education regarding current functional status and recommendations.   HPI HPI: 80 y.o. female with a history of hypertension and hyperlipidemia who developed acute onset of inability to speak, gaze to the right and paralysis of left extremities    Pertinent Vitals N/A  SLP Plan  Continue with current plan of care    Recommendations Diet recommendations: NPO;Other(comment) (may wish to consider alternative means of nutrition) Medication Administration: Via alternative means              General recommendations: Rehab consult Oral Care Recommendations: Oral care Q4 per protocol Follow up Recommendations: Inpatient Rehab;24 hour supervision/assistance Plan: Continue with current plan of care    GO      Maxcine HamLaura Paiewonsky, M.A. CCC-SLP [phone]  Maxcine Hamaiewonsky, Sarissa Dern 01/14/2014, 2:49 PM

## 2014-01-14 NOTE — Progress Notes (Signed)
Patient ID: Tammy BeachRosa L Hanback, female   DOB: 1924-08-22, 80 y.o.   MRN: [id number]         [facility]    Date of Admission:  01/11/2014   Total days of antibiotics 4         Principal Problem:   Bacteremia due to Staphylococcus Active Problems:   Left hemiplegia   Hypertension   Hyponatremia   Hyperkalemia   Hematuria   E. coli UTI (urinary tract infection)   Acute encephalopathy   Acute respiratory failure with hypoxia   . aspirin  300 mg Rectal Daily   Or  . aspirin  325 mg Oral Daily  .  ceFAZolin (ANCEF) IV  2 g Intravenous 3 times per day  . furosemide  20 mg Intravenous Daily  . levothyroxine  25 mcg Intravenous Daily  . metoprolol  5 mg Intravenous 4 times per day  . pantoprazole (PROTONIX) IV  40 mg Intravenous QHS  . vancomycin  1,000 mg Intravenous Q24H    Subjective: She is feeling better. She denies headache.  Past Medical History  Diagnosis Date  . Hyperlipidemia   . Hypertension   . Anxiety   . GERD (gastroesophageal reflux disease)   . Abnormal heart rhythms   . Fatty liver   . Internal hemorrhoid     History  Substance Use Topics  . Smoking status: Never Smoker   . Smokeless tobacco: Never Used  . Alcohol Use: No    Family History  Problem Relation Age of Onset  . Stroke Father   . Hypertension Father   . Colon cancer Cousin   . Colon cancer      Aunt  . Diabetes      aunt and cousins  . Heart disease      cousins    Allergies  Allergen Reactions  . Crestor [Rosuvastatin Calcium]   . Sulfa Drugs Cross Reactors Rash    Objective: Temp:  [98.1 F (36.7 C)-98.7 F (37.1 C)] 98.2 F (36.8 C) (02/12 1146) Pulse Rate:  [92-105] 99 (02/12 1140) Resp:  [15-22] 15 (02/12 1140) BP: (148-168)/(60-78) 148/72 mmHg (02/12 1140) SpO2:  [99 %-100 %] 100 % (02/12 1140)  General: She is more alert and interacting with family Skin: No splinter or conjunctival hemorrhages Lungs: Clear Cor: Regular S1-S2 no  murmurs Abdomen: Nontender She is now moving her left side  Lab Results Lab Results  Component Value Date   WBC 24.9* 01/14/2014   HGB 12.2 01/14/2014   HCT 35.7* 01/14/2014   MCV 87.5 01/14/2014   PLT 290 01/14/2014    Lab Results  Component Value Date   CREATININE 0.66 01/14/2014   BUN 24* 01/14/2014   NA 127* 01/14/2014   K 4.2 01/14/2014   CL 86* 01/14/2014   CO2 26 01/14/2014    Lab Results  Component Value Date   ALT 11 01/11/2014   AST 22 01/11/2014   ALKPHOS 89 01/11/2014   BILITOT 0.5 01/11/2014      Microbiology: Recent Results (from the past 240 hour(s))  URINE CULTURE     Status: None   Collection Time    01/11/14  7:51 PM      Result Value Ref Range Status   Specimen Description URINE, CATHETERIZED   Final   Special Requests NONE   Final   Culture  Setup Time     Final   Value: 01/11/2014 21:34     [facility]formed at [facility]  [place] Count     Final   Value: >=100,000 COLONIES/ML     Performed at [facility]   Culture     Final   Value: ESCHERICHIA COLI     Performed at [facility]   Report Status 01/13/2014 FINAL   Final   Organism ID, Bacteria ESCHERICHIA COLI   Final  CULTURE, BLOOD (ROUTINE X 2)     Status: None   Collection Time    01/12/14  6:00 AM      Result Value Ref Range Status   Specimen Description BLOOD RIGHT HAND   Final   Special Requests BOTTLES DRAWN AEROBIC ONLY 5CC   Final   Culture  Setup Time     Final   Value: 01/12/2014 10:21     Performed at [facility]   Culture     Final   Value: STAPHYLOCOCCUS SPECIES (COAGULASE NEGATIVE)     Note: Gram Stain Report Called to,Read Back By and Verified With: Gray Bernhardt [id number] BRMEL     Performed at [facility]   Report Status PENDING   Incomplete  CULTURE, BLOOD (ROUTINE X 2)     Status: None   Collection Time    01/12/14  6:06 AM      Result Value Ref Range Status   Specimen Description BLOOD RIGHT ANTECUBITAL   Final   Special Requests  BOTTLES DRAWN AEROBIC ONLY 3CC   Final   Culture  Setup Time     Final   Value: 01/12/2014 10:21     Performed at [facility]   Culture     Final   Value: STAPHYLOCOCCUS SPECIES (COAGULASE NEGATIVE)     Note: RIFAMPIN AND GENTAMICIN SHOULD NOT BE USED AS SINGLE DRUGS FOR TREATMENT OF STAPH INFECTIONS.     Note: Gram Stain Report Called to,Read Back By and Verified With: Kerby Moors [id number] BRMEL     Performed at [facility]   Report Status PENDING   Incomplete  MRSA PCR SCREENING     Status: None   Collection Time    01/12/14  6:35 AM      Result Value Ref Range Status   MRSA by PCR NEGATIVE  NEGATIVE Final   Comment:            The GeneXpert MRSA Assay (FDA     approved for NASAL specimens     only), is one component of a     comprehensive MRSA colonization     surveillance program. It is not     intended to diagnose MRSA     infection nor to guide or     monitor treatment for     MRSA infections.    Studies/Results: No results found.  Assessment: She is now afebrile and improving neurologically. She is coagulase-negative staph bacteremia. Her transthoracic echocardiogram did not reveal any evidence of endocarditis and her brain scans did not reveal definite evidence of CVA but am still concerned that she may have embolized a small vegetation. I will continue current antibiotic therapy for now pending antibiotic susceptibility results.  Plan: 1. Continue vancomycin and cefazolin pending final blood cultures 2. Await results of repeat MRI  Cliffton Asters, MD [facility] [phone] pager   [phone] cell 01/14/2014, 12:55 PM

## 2014-01-14 NOTE — Progress Notes (Signed)
Rehab admissions - I met with pt and her son/family friend and explained the role of inpatient rehab. Son is very interested in pursuing this and shared that pt has a very supportive family of 6 children and grandchildren. Informational brochures given and questions answered.  Noted that MRI head is ordered for today. Plan to open case to seek insurance authorization from [place] care and will keep pt/family and medical team updated on progress.  Please call me with any questions. Thanks.   Nanetta Batty, PT Rehabilitation Admissions Coordinator [phone]

## 2014-01-15 DIAGNOSIS — A4902 Methicillin resistant Staphylococcus aureus infection, unspecified site: Secondary | ICD-10-CM

## 2014-01-15 DIAGNOSIS — I69391 Dysphagia following cerebral infarction: Secondary | ICD-10-CM

## 2014-01-15 DIAGNOSIS — B958 Unspecified staphylococcus as the cause of diseases classified elsewhere: Secondary | ICD-10-CM

## 2014-01-15 DIAGNOSIS — R7881 Bacteremia: Secondary | ICD-10-CM

## 2014-01-15 LAB — GLUCOSE, CAPILLARY
GLUCOSE-CAPILLARY: 142 mg/dL — AB (ref 70–99)
GLUCOSE-CAPILLARY: 189 mg/dL — AB (ref 70–99)
GLUCOSE-CAPILLARY: 141 mg/dL — AB (ref 70–99)
GLUCOSE-CAPILLARY: 202 mg/dL — AB (ref 70–99)
Glucose-Capillary: 91 mg/dL (ref 70–99)
Glucose-Capillary: 191 mg/dL — ABNORMAL HIGH (ref 70–99)

## 2014-01-15 LAB — PHOSPHORUS: PHOSPHORUS: 2.4 mg/dL (ref 2.3–4.6)

## 2014-01-15 LAB — BASIC METABOLIC PANEL
BUN: 21 mg/dL (ref 6–23)
CHLORIDE: 86 meq/L — AB (ref 96–112)
CO2: 35 mEq/L — ABNORMAL HIGH (ref 19–32)
Calcium: 9.8 mg/dL (ref 8.4–10.5)
Creatinine, Ser: 0.64 mg/dL (ref 0.50–1.10)
GFR calc Af Amer: >90 mL/min (ref >90)
GFR calc non Af Amer: 78 mL/min — ABNORMAL LOW (ref >90)
Glucose, Bld: 157 mg/dL — ABNORMAL HIGH (ref 70–99)
Potassium: 3.9 mEq/L (ref 3.7–5.3)
SODIUM: 130 meq/L — AB (ref 137–147)

## 2014-01-15 LAB — CULTURE, BLOOD (ROUTINE X 2)

## 2014-01-15 LAB — CBC
HCT: 35.5 % — ABNORMAL LOW (ref 36.0–46.0)
Hemoglobin: 11.9 g/dL — ABNORMAL LOW (ref 12.0–15.0)
MCH: 29.9 pg (ref 26.0–34.0)
MCHC: 33.5 g/dL (ref 30.0–36.0)
MCV: 89.2 fL (ref 78.0–100.0)
PLATELETS: 257 10*3/uL (ref 150–400)
RBC: 3.98 MIL/uL (ref 3.87–5.11)
RDW: 13 % (ref 11.5–15.5)
WBC: 17.6 10*3/uL — AB (ref 4.0–10.5)

## 2014-01-15 LAB — MAGNESIUM: MAGNESIUM: 2 mg/dL (ref 1.5–2.5)

## 2014-01-15 MED ORDER — LORAZEPAM 2 MG/ML PO CONC: 0.5000 mg | Freq: Four times a day (QID) | ORAL | Status: DC | PRN

## 2014-01-15 MED ORDER — LORAZEPAM 0.5 MG PO TABS
0.5000 mg | ORAL_TABLET | Freq: Four times a day (QID) | ORAL | Status: DC | PRN
  Administered 2014-01-17 – 2014-01-18 (×3): 0.5 mg via ORAL
  Filled 2014-01-15 (×3): qty 1

## 2014-01-15 MED ORDER — METOPROLOL TARTRATE 25 MG/10 ML ORAL SUSPENSION
12.5000 mg | Freq: Two times a day (BID) | ORAL | Status: DC
  Administered 2014-01-15 – 2014-01-17 (×6): 12.5 mg
  Filled 2014-01-15 (×8): qty 5

## 2014-01-15 MED ORDER — OSMOLITE 1.5 CAL PO LIQD
1000.0000 mL | ORAL | Status: DC
  Administered 2014-01-15 – 2014-01-16 (×2): 1000 mL
  Filled 2014-01-15 (×7): qty 1000

## 2014-01-15 MED ORDER — HYDRALAZINE HCL 20 MG/ML IJ SOLN
10.0000 mg | INTRAMUSCULAR | Status: DC | PRN
  Administered 2014-01-16 – 2014-01-19 (×3): 10 mg via INTRAVENOUS
  Filled 2014-01-15 (×3): qty 1

## 2014-01-15 MED ORDER — ZOLPIDEM TARTRATE 5 MG PO TABS: 5.0000 mg | ORAL_TABLET | Freq: Every evening | ORAL | Status: DC | PRN

## 2014-01-15 MED ORDER — LEVOTHYROXINE SODIUM 50 MCG PO TABS
50.0000 ug | ORAL_TABLET | Freq: Every day | ORAL | Status: DC
  Administered 2014-01-16 – 2014-01-19 (×4): 50 ug
  Filled 2014-01-15 (×5): qty 1

## 2014-01-15 MED ORDER — HALOPERIDOL LACTATE 5 MG/ML IJ SOLN
0.5000 mg | Freq: Once | INTRAMUSCULAR | Status: AC
  Administered 2014-01-15: 0.5 mg via INTRAVENOUS
  Filled 2014-01-15: qty 1

## 2014-01-15 MED ORDER — STROKE: EARLY STAGES OF RECOVERY BOOK
Freq: Once | Status: DC
  Filled 2014-01-15: qty 1

## 2014-01-15 MED ORDER — PRO-STAT SUGAR FREE PO LIQD
30.0000 mL | Freq: Every day | ORAL | Status: DC
  Administered 2014-01-15 – 2014-01-18 (×4): 30 mL
  Filled 2014-01-15 (×6): qty 30

## 2014-01-15 NOTE — Progress Notes (Signed)
Subjective:  Possibly improving but still quite debilitated- BP high- now concern is for endocarditis and septic emboli - ID involved.  Sodium definitely seems to be trending better, latest urine osm 600's   Objective Vital signs in last 24 hours: Filed Vitals:   01/15/14 0426 01/15/14 0549 01/15/14 0721 01/15/14 0728  BP: 169/74 159/87  105/50  Pulse: 106 111  88  Temp: 98.1 F (36.7 C)  99.2 F (37.3 C)   TempSrc: Oral  Axillary   Resp: 18 23  25   Height:      Weight:      SpO2: 99% 98%  100%   Weight change:   Intake/Output Summary (Last 24 hours) at 01/15/14 1003 Last data filed at 01/15/14 1610  Gross per 24 hour  Intake     50 ml  Output   2175 ml  Net  -2125 ml    Assessment/ Plan: Pt is a 80 y.o. yo female who was admitted on 01/11/2014 with probable acute CVA with hyponatremia and UTI , now bacteremia as well Assessment/Plan: 1. Hyponatremia- initially responded well to only NS-   Suspect initial presentation was volume depletion but also SIADH.  TSH if anything overcontrolled, cortisol is high so these are not responsible. High urine osm in the setting of euvolemia confirms SIADH.  Is on fluid restriction and have added low dose loop diuretic to dilute urine some. Sodium trending better.  Not sure if it will get much better than this or mid 130's.   I would continue plan as is with fluid restriction 1200-1500 cc per day and low dose lasix- once can take POs would change to oral lasix 20 mg daily 2. HTN.vol- multifactorial now hypertensive-  added low dose loop diuretic- and PRN   lopressor  3. Anemia- not an issue 4. Fever and bacteremia- bacteremia-  ID on board, on ancef and vanc 5. Dispo - slow improvement and has a ways to go, looking at SNF   Renal will sign off, call with additional questions    Rodricus Candelaria A    Labs: Basic Metabolic Panel:  Recent Labs Lab 01/12/14 1726 01/13/14 0223 01/14/14 0239 01/15/14 0442  NA 123* 125* 127* 130*  K 4.4  4.4 4.2 3.9  CL 82* 82* 86* 86*  CO2 28 29 26  35*  GLUCOSE 159* 174* 130* 157*  BUN 23 27* 24* 21  CREATININE 0.96 0.93 0.66 0.64  CALCIUM 9.5 9.4 9.4 9.8  PHOS  --  3.3  --  2.4   Liver Function Tests:  Recent Labs Lab 01/11/14 1544 01/13/14 0223  AST 22  --   ALT 11  --   ALKPHOS 89  --   BILITOT 0.5  --   PROT 8.3  --   ALBUMIN 3.2* 3.0*   No results found for this basename: LIPASE, AMYLASE,  in the last 168 hours No results found for this basename: AMMONIA,  in the last 168 hours CBC:  Recent Labs Lab 01/11/14 1544 01/11/14 1709 01/14/14 0239 01/15/14 0845  WBC 11.9*  --  24.9* 17.6*  NEUTROABS 8.8*  --   --   --   HGB 15.0 17.3* 12.2 11.9*  HCT 41.5 51.0* 35.7* 35.5*  MCV 84.7  --  87.5 89.2  PLT 321  --  290 257   Cardiac Enzymes:  Recent Labs Lab 01/11/14 1544  TROPONINI <0.30   CBG:  Recent Labs Lab 01/12/14 1233 01/13/14 0025 01/15/14 0338 01/15/14 0551 01/15/14 0724  GLUCAP  150* 99 142* 91 189*    Iron Studies: No results found for this basename: IRON, TIBC, TRANSFERRIN, FERRITIN,  in the last 72 hours Studies/Results: Mr Lodema PilotBrain W Wo Contrast  01/14/2014   CLINICAL DATA:  80 year old female with seizure. Possible abnormal right temporal lobe. Possible stroke. Initial encounter.  EXAM: MRI HEAD WITHOUT AND WITH CONTRAST  TECHNIQUE: Multiplanar, multiecho pulse sequences of the brain and surrounding structures were obtained without and with intravenous contrast.  CONTRAST:  15mL MULTIHANCE GADOBENATE DIMEGLUMINE 529 MG/ML IV SOLN  COMPARISON:  01/11/2014 brain MRI, MRA.  FINDINGS: Suspected cortical T2 hyperintensity in the right temporal lobe has regressed. No definite diffusion-weighted abnormality in this region today. Following contrast, no right temporal lobe enhancement identified.  There is a small 8 mm cortical versus subcortical white matter focus of restricted diffusion in the right parietal lobe (series 3, image 19 and series 4, image 7).   No other diffusion abnormality identified. Major intracranial vascular flow voids are stable.  Elsewhere stable gray and white matter signal. No acute intracranial hemorrhage identified. No midline shift, mass effect, or evidence of intracranial mass lesion. No ventriculomegaly. Stable pituitary, cervicomedullary junction visualized cervical spine. No abnormal enhancement identified.  Normal bone marrow signal. Visualized scalp soft tissues are within normal limits. Visible internal auditory structures appear normal. Stable orbits soft tissues. Visualized paranasal sinuses and mastoids are clear.  IMPRESSION: 1. Small acute lacunar infarct in the right parietal lobe (posterior right MCA territory). No mass effect or hemorrhage. 2. Regressed abnormal signal in the right temporal lobe, and no diffusion changes or postcontrast enhancement. However, I consider this is real gyral abnormality, favored to be seizure sequelae in this setting. 3. No other new intracranial abnormality.   Electronically Signed   By: Augusto GambleLee  Hall M.D.   On: 01/14/2014 19:48   Dg Abd Portable 1v  01/14/2014   CLINICAL DATA:  Check feeding tube placement  EXAM: PORTABLE ABDOMEN - 1 VIEW  COMPARISON:  None.  FINDINGS: A nasogastric catheter is noted just beyond the gastroesophageal junction. Scattered large and small bowel gas is noted. No obstructive changes are seen.   Electronically Signed   By: Alcide CleverMark  Lukens M.D.   On: 01/14/2014 20:15   Medications: Infusions: . feeding supplement (JEVITY 1.2 CAL) 20 mL/hr at 01/14/14 2241    Scheduled Medications: . aspirin  300 mg Rectal Daily   Or  . aspirin  325 mg Oral Daily  .  ceFAZolin (ANCEF) IV  2 g Intravenous 3 times per day  . furosemide  20 mg Intravenous Daily  . levothyroxine  25 mcg Intravenous Daily  . metoprolol  5 mg Intravenous 4 times per day  . pantoprazole (PROTONIX) IV  40 mg Intravenous QHS  . vancomycin  1,000 mg Intravenous Q24H    have reviewed scheduled and prn  medications.  Physical Exam: General: left neglect, one word to moan responses Heart: tachy Lungs: poor effort Abdomen: distended Extremities: mild edema    01/15/2014,10:03 AM  LOS: 4 days

## 2014-01-15 NOTE — Progress Notes (Signed)
Physical Therapy Treatment Patient Details Name: Tammy Fernandez MRN: [id number] DOB: [birth year] Today's Date: 01/15/2014 Time: 8295-6213 PT Time Calculation (min): 25 min  PT Assessment / Plan / Recommendation  History of Present Illness Tammy Fernandez is an 80 y.o. female with a history of hypertension and hyperlipidemia who developed acute onset of inability to speak, gaze to the right and paralysis of left extremities earlier today. Patient was last seen well at 9:30 this morning. She has no previous history of stroke nor TIA. She's been taking aspirin daily. CT scan of her head showed no acute intracranial abnormality. NIH stroke score was 22.   PT Comments   Pt with increased participation in therapy today. Pt was more alert and answered questions appropriately ~50% of time. Pt also demo increased ability to take pivotal steps with 2 person (A) to chair. Pt pulling at NG tube during transfer, RN notified and tape reinforced on tube. Cont to recommend CIR. Will cont to follow per POC.   Follow Up Recommendations  CIR     Does the patient have the potential to tolerate intense rehabilitation     Barriers to Discharge        Equipment Recommendations  None recommended by PT    Recommendations for Other Services Rehab consult  Frequency Min 4X/week   Progress towards PT Goals Progress towards PT goals: Progressing toward goals  Plan Current plan remains appropriate    Precautions / Restrictions Precautions Precautions: Fall Precaution Comments: NG tube; eyes open; difficulty tracking and looking Rt today Restrictions Weight Bearing Restrictions: No   Pertinent Vitals/Pain Stable t/o session.     Mobility  Bed Mobility Overal bed mobility: Needs Assistance;+2 for physical assistance Bed Mobility: Supine to Sit Supine to sit: +2 for physical assistance;Total assist General bed mobility comments: use of helicopter technique to bring pt to sitting position; pt initated movement  only with Rt UE on handrail; max multidirectional cues  Transfers Overall transfer level: Needs assistance Equipment used: 2 person hand held assist Transfers: Sit to/from Stand;Stand Pivot Transfers Sit to Stand: +2 physical assistance;Max assist Stand pivot transfers: +2 physical assistance;+2 safety/equipment;From elevated surface;Max assist General transfer comment: pt much more partcipatory today; pt able to take pivotal steps with 2 person (A) to chair with cues through hips; max cues for sequencing  Modified Rankin (Stroke Patients Only) Pre-Morbid Rankin Score: No significant disability Modified Rankin: Severe disability    Exercises  Heel slides AAROM both x10    PT Diagnosis:    PT Problem List:   PT Treatment Interventions:     PT Goals (current goals can now be found in the care plan section) Acute Rehab PT Goals Patient Stated Goal: none stated PT Goal Formulation: With patient/family Time For Goal Achievement: 01/26/14 Potential to Achieve Goals: Good  Visit Information  Last PT Received On: 01/15/14 Assistance Needed: +2 History of Present Illness: Tammy Fernandez is an 80 y.o. female with a history of hypertension and hyperlipidemia who developed acute onset of inability to speak, gaze to the right and paralysis of left extremities earlier today. Patient was last seen well at 9:30 this morning. She has no previous history of stroke nor TIA. She's been taking aspirin daily. CT scan of her head showed no acute intracranial abnormality. NIH stroke score was 22.    Subjective Data  Subjective: pt lying supine; alert and answering questions more consistently today  Patient Stated Goal: none stated   Cognition  Cognition Arousal/Alertness: Awake/alert  Behavior During Therapy: Flat affect Overall Cognitive Status: Impaired/Different from baseline Area of Impairment: Orientation;Attention;Memory;Following commands;Safety/judgement;Awareness;Problem solving Orientation  Level: Disoriented to;Place;Situation Current Attention Level: Sustained Memory: Decreased recall of precautions;Decreased short-term memory Following Commands: Follows one step commands inconsistently;Follows one step commands with increased time Safety/Judgement: Decreased awareness of deficits Awareness: Intellectual Problem Solving: Slow processing;Decreased initiation;Difficulty sequencing;Requires verbal cues;Requires tactile cues General Comments: pt able to answer more questions consistently; answers were appropriate ~50% of the time; pt was able to orient to year and self today     Balance  Balance Overall balance assessment: Needs assistance Sitting-balance support: Feet supported;Bilateral upper extremity supported Sitting balance-[name]: Poor Sitting balance - Comments: pt progressed to sitting at S today; demo Rt lateral gaze; encouraged to perfom cervical rotation; pt able to minimally rotate to Lt today and was guarded Postural control: Posterior lean Standing balance support: During functional activity;Bilateral upper extremity supported Standing balance-[name]: Zero Standing balance comment: 2 person (A)  General Comments General comments (skin integrity, edema, etc.): encouraged pt and family to perform Lt hand open/close exercises; Lt UE presented with edema today  End of Session PT - End of Session Equipment Utilized During Treatment: Gait belt;Oxygen Activity Tolerance: Patient tolerated treatment well Patient left: in chair;with call bell/phone within reach;with family/visitor present Nurse Communication: Mobility status;Need for lift equipment;Other (comment) (NG tube)   GP     Donell SievertWest, Kaynan Klonowski N, [place] CarolinaPT [phone] 01/15/2014, 5:31 PM

## 2014-01-15 NOTE — Progress Notes (Signed)
Progress Note [facility] TEAM 1 - Stepdown/ICU TEAM   Tammy Fernandez XBM:[id number] DOB: Sep 11, 1924 DOA: 01/11/2014 PCP: [name], MD  Brief narrative: 80 y.o. female presenting on 01/11/2014 with a past medical history of Hyperlipidemia; Hypertension; Anxiety; GERD (gastroesophageal reflux disease); Abnormal heart rhythms; Fatty liver; and Internal hemorrhoid who was noted by family to be nonverbal, looking to the right and not moving her left side at 2:30 PM after last being seen normal at 10 AM. CT head in ER was negative. She was also noted to have a low sodium and a UTI.   Subjective: Much more alert but seems confused- did receive sedation around 5 am  Assessment/Plan:  Left side weakness/encephalopathy/smal Lacunar infarct R parietal lobe - Initial MRI equivocal but FU positive for small CVA -incidentally signal change right temporal lobe ?? Seizure related -TTE negative for vegetation but ID rec treat presumptively and does not recommend TEE -Neuro treating as possible meningitis-?? Dc Rocephin and Acyclovir -ID does not favor pursuit of LP since would not change tx outcome -PT/OT eval  Sepsis due to E Coli UTI and Methacillin resistant coag-negative staph bacteremia -cont Rocephin but with possibility of concurrent meningitis will increase dose to 2 gm Q12 -cont IV Vanco-WBC trend is down -recent issues with persistent sinusitis prior to admission so this could be source -essentially pan sensitive e coli (resistant to amp) -appreciate ID assistance -PICC line 2/14 if FU blood cx's remain negative  Acute dysphagia -cont TF and titrate up to goal rate 45/hr -suspect as infections are effectively treated and with cont'd FU per SLP may eventually pass swallow eval   Acute respiratory failure with hypoxia -seems r/t hypoventilation and AMS -tol O2 wean -no focal infiltrate on CXR    Hypertension - cont IV Metoprolol-dose increased  2/11    Hyponatremia -  appears to have a chronic component upon discussion with family - seemed dehydrated and sodium slowly improved with fluids - appreciate nephro consult- serum cortisol normal - Nephro feels now euvolemic and initiated fluid restriction 2/11- started Lasix 2/12 and as of 2/13 rec cont same plan    Hyperkalemia - resolved    Hematuria - likely due to UTI    Low TSH -  Free T4 slightly elevated so suspect sick euthyroid syndrome - cont usual dose Levothyroxine  -repeat TSH 8-12 weeks post dc    Code Status: Full code Family Communication: with son at bedside  Disposition Plan: follow in SDU   Consultants: neuro  Procedures: TTE 2/10 Left ventricle: The cavity size was normal. There was mild concentric hypertrophy. Systolic function was normal. The estimated ejection fraction was in the range of 55% to 60%. Wall motion was normal; there were no regional wall motion abnormalities. Doppler parameters are consistent with abnormal left ventricular relaxation (grade 1 diastolic dysfunction). - No cardiac source of embolism was identified, but cannot be ruled out on the basis of this examination. 1. Consider transesophageal echocardiography if clinically indicated. 2. Consider transesophageal echocardiography if clinically indicated in order to exclude intracardiac thrombus.    Antibiotics: Ceftriaxone 2/9 >>> Vancomycin 2/10 >>> Acyclovir 2/11 >>>  DVT prophylaxis: SCDs  Objective: Filed Weights   01/11/14 2230  Weight: 190 lb 14.7 oz (86.6 kg)   Blood pressure 105/50, pulse 88, temperature 98.5 F (36.9 C), temperature source Oral, resp. rate 25, height 5\' 2"  (1.575 m), weight 190 lb 14.7 oz (86.6 kg), SpO2 100.00%.  Intake/Output Summary (Last 24 hours) at 01/15/14 1242 Last data  filed at 01/15/14 1125  Gross per 24 hour  Intake     50 ml  Output   1925 ml  Net  -1875 ml     Exam: General: No acute respiratory distress-much less lethargic Lungs: Clear to  auscultation bilaterally without wheezes or crackles- 1 L Cardiovascular: Regular rate and rhythm without murmur gallop or rub normal S1 and S2 Abdomen: Nontender, nondistended, soft, bowel sounds positive, no rebound, no ascites, no appreciable mass Extremities: No significant cyanosis, clubbing, or edema bilateral lower extremities Neuro: left sided neglect, left facial droop, left arm and leg 0/5. Right 5/5- increased tone in left leg  Data Reviewed: Basic Metabolic Panel:  Recent Labs Lab 01/12/14 1330 01/12/14 1726 01/13/14 0223 01/14/14 0239 01/15/14 0442  NA 124* 123* 125* 127* 130*  K 4.7 4.4 4.4 4.2 3.9  CL 83* 82* 82* 86* 86*  CO2 28 28 29 26  35*  GLUCOSE 142* 159* 174* 130* 157*  BUN 18 23 27* 24* 21  CREATININE 0.83 0.96 0.93 0.66 0.64  CALCIUM 9.4 9.5 9.4 9.4 9.8  MG  --   --   --   --  2.0  PHOS  --   --  3.3  --  2.4   Liver Function Tests:  Recent Labs Lab 01/11/14 1544 01/13/14 0223  AST 22  --   ALT 11  --   ALKPHOS 89  --   BILITOT 0.5  --   PROT 8.3  --   ALBUMIN 3.2* 3.0*   No results found for this basename: LIPASE, AMYLASE,  in the last 168 hours No results found for this basename: AMMONIA,  in the last 168 hours  CBC:  Recent Labs Lab 01/11/14 1544 01/11/14 1709 01/14/14 0239 01/15/14 0845  WBC 11.9*  --  24.9* 17.6*  NEUTROABS 8.8*  --   --   --   HGB 15.0 17.3* 12.2 11.9*  HCT 41.5 51.0* 35.7* 35.5*  MCV 84.7  --  87.5 89.2  PLT 321  --  290 257   Cardiac Enzymes:  Recent Labs Lab 01/11/14 1544  TROPONINI <0.30   BNP (last 3 results) No results found for this basename: PROBNP,  in the last 8760 hours CBG:  Recent Labs Lab 01/13/14 0025 01/15/14 0338 01/15/14 0551 01/15/14 0724 01/15/14 1120  GLUCAP 99 142* 91 189* 141*    Recent Results (from the past 240 hour(s))  URINE CULTURE     Status: None   Collection Time    01/11/14  7:51 PM      Result Value Ref Range Status   Specimen Description URINE,  CATHETERIZED   Final   Special Requests NONE   Final   Culture  Setup Time     Final   Value: 01/11/2014 21:34     Performed at [facility] Count     Final   Value: >=100,000 COLONIES/ML     Performed at [facility]   Culture     Final   Value: ESCHERICHIA COLI     Performed at [facility]   Report Status 01/13/2014 FINAL   Final   Organism ID, Bacteria ESCHERICHIA COLI   Final  CULTURE, BLOOD (ROUTINE X 2)     Status: None   Collection Time    01/12/14  6:00 AM      Result Value Ref Range Status   Specimen Description BLOOD RIGHT HAND   Final   Special Requests BOTTLES  DRAWN AEROBIC ONLY 5CC   Final   Culture  Setup Time     Final   Value: 01/12/2014 10:21     [facility]   Culture     Final   Value: STAPHYLOCOCCUS SPECIES (COAGULASE NEGATIVE)     Note: SUSCEPTIBILITIES PERFORMED ON PREVIOUS CULTURE WITHIN THE LAST 5 DAYS.     Note: Gram Stain Report Called to,Read Back By and Verified With: Gray BernhardtENNE BROWN 0113A [id number] BRMEL     [facility]   Report Status 01/15/2014 FINAL   Final  CULTURE, BLOOD (ROUTINE X 2)     Status: None   Collection Time    01/12/14  6:06 AM      Result Value Ref Range Status   Specimen Description BLOOD RIGHT ANTECUBITAL   Final   Special Requests BOTTLES DRAWN AEROBIC ONLY 3CC   Final   Culture  Setup Time     Final   Value: 01/12/2014 10:21     [facility]   Culture     Final   Value: STAPHYLOCOCCUS SPECIES (COAGULASE NEGATIVE)     Note: RIFAMPIN AND GENTAMICIN SHOULD NOT BE USED AS SINGLE DRUGS FOR TREATMENT OF STAPH INFECTIONS.     Note: Gram Stain Report Called to,Read Back By and Verified With: Kerby MoorsENEE BROWN [id number] BRMEL     [facility]   Report Status 01/15/2014 FINAL   Final   Organism ID, Bacteria STAPHYLOCOCCUS SPECIES (COAGULASE NEGATIVE)   Final  MRSA PCR SCREENING     Status: None   Collection Time     01/12/14  6:35 AM      Result Value Ref Range Status   MRSA by PCR NEGATIVE  NEGATIVE Final   Comment:            The GeneXpert MRSA Assay (FDA     approved for NASAL specimens     only), is one component of a     comprehensive MRSA colonization     surveillance program. It is not     intended to diagnose MRSA     infection nor to guide or     monitor treatment for     MRSA infections.     Studies:  Recent x-ray studies have been reviewed in detail by the Attending Physician  Scheduled Meds:  Scheduled Meds: . aspirin  300 mg Rectal Daily   Or  . aspirin  325 mg Oral Daily  . feeding supplement (PRO-STAT SUGAR FREE 64)  30 mL Per Tube Daily  . furosemide  20 mg Intravenous Daily  . levothyroxine  25 mcg Intravenous Daily  . metoprolol  5 mg Intravenous 4 times per day  . pantoprazole (PROTONIX) IV  40 mg Intravenous QHS  . vancomycin  1,000 mg Intravenous Q24H    Time spent on care of this patient: 35 min   ELLIS,ALLISON L., ANP  [facility] Office  [phone] Pager - Text Page per Loretha StaplerAmion as per below:  On-Call/Text Page:      [email or web address]      password [email or web address]  If 7PM-7AM, please contact night-coverage [email or web address] Password [email or web address] 01/15/2014, 12:42 PM   LOS: 4 days       I have examined the patient, reviewed the chart and modified the above note which I agree with.   Tammy Halls,MD [phone] 01/15/2014, 4:02 PM

## 2014-01-15 NOTE — Progress Notes (Signed)
Pt is screaming and restless. PRN dose of 0.5 mg ativan IV given with no relief. NP notified. New orders received. Will continue to monitor.

## 2014-01-15 NOTE — Progress Notes (Signed)
Patient ID: Tammy Fernandez, female   DOB: 1924/05/14, 80 y.o.   MRN: [id number]         [facility]    Date of Admission:  01/11/2014   Total days of antibiotics 5         Principal Problem:   Bacteremia due to Staphylococcus Active Problems:   Left hemiplegia   Hypertension   Hyponatremia   Hyperkalemia   Hematuria   E. coli UTI (urinary tract infection)   Acute encephalopathy   Acute respiratory failure with hypoxia   . aspirin  300 mg Rectal Daily   Or  . aspirin  325 mg Oral Daily  .  ceFAZolin (ANCEF) IV  2 g Intravenous 3 times per day  . feeding supplement (PRO-STAT SUGAR FREE 64)  30 mL Per Tube Daily  . furosemide  20 mg Intravenous Daily  . levothyroxine  25 mcg Intravenous Daily  . metoprolol  5 mg Intravenous 4 times per day  . pantoprazole (PROTONIX) IV  40 mg Intravenous QHS  . vancomycin  1,000 mg Intravenous Q24H     Past Medical History  Diagnosis Date  . Hyperlipidemia   . Hypertension   . Anxiety   . GERD (gastroesophageal reflux disease)   . Abnormal heart rhythms   . Fatty liver   . Internal hemorrhoid     History  Substance Use Topics  . Smoking status: Never Smoker   . Smokeless tobacco: Never Used  . Alcohol Use: No    Family History  Problem Relation Age of Onset  . Stroke Father   . Hypertension Father   . Colon cancer Cousin   . Colon cancer      Aunt  . Diabetes      aunt and cousins  . Heart disease      cousins    Allergies  Allergen Reactions  . Crestor [Rosuvastatin Calcium]   . Sulfa Drugs Cross Reactors Rash    Objective: Temp:  [97.8 F (36.6 C)-99.2 F (37.3 C)] 99.2 F (37.3 C) (02/13 0721) Pulse Rate:  [88-111] 88 (02/13 0728) Resp:  [15-25] 25 (02/13 0728) BP: (105-199)/(39-94) 105/50 mmHg (02/13 0728) SpO2:  [96 %-100 %] 100 % (02/13 0728)  General: She was agitated overnight Skin: No splinter or conjunctival hemorrhages Lungs: Clear Cor: Regular S1-S2 no  murmurs Abdomen: Nontender  Lab Results Lab Results  Component Value Date   WBC 17.6* 01/15/2014   HGB 11.9* 01/15/2014   HCT 35.5* 01/15/2014   MCV 89.2 01/15/2014   PLT 257 01/15/2014    Lab Results  Component Value Date   CREATININE 0.64 01/15/2014   BUN 21 01/15/2014   NA 130* 01/15/2014   K 3.9 01/15/2014   CL 86* 01/15/2014   CO2 35* 01/15/2014    Lab Results  Component Value Date   ALT 11 01/11/2014   AST 22 01/11/2014   ALKPHOS 89 01/11/2014   BILITOT 0.5 01/11/2014      Microbiology: Recent Results (from the past 240 hour(s))  URINE CULTURE     Status: None   Collection Time    01/11/14  7:51 PM      Result Value Ref Range Status   Specimen Description URINE, CATHETERIZED   Final   Special Requests NONE   Final   Culture  Setup Time     Final   Value: 01/11/2014 21:34     [facility]   Colony Count  Final   Value: >=100,000 COLONIES/ML     Performed at [facility]   Culture     Final   Value: ESCHERICHIA COLI     Performed at [facility]   Report Status 01/13/2014 FINAL   Final   Organism ID, Bacteria ESCHERICHIA COLI   Final  CULTURE, BLOOD (ROUTINE X 2)     Status: None   Collection Time    01/12/14  6:00 AM      Result Value Ref Range Status   Specimen Description BLOOD RIGHT HAND   Final   Special Requests BOTTLES DRAWN AEROBIC ONLY 5CC   Final   Culture  Setup Time     Final   Value: 01/12/2014 10:21     Performed at [facility]   Culture     Final   Value: STAPHYLOCOCCUS SPECIES (COAGULASE NEGATIVE)     Note: SUSCEPTIBILITIES PERFORMED ON PREVIOUS CULTURE WITHIN THE LAST 5 DAYS.     Note: Gram Stain Report Called to,Read Back By and Verified With: Tammy Fernandez 0113A [id number] BRMEL     Performed at [facility]   Report Status 01/15/2014 FINAL   Final  CULTURE, BLOOD (ROUTINE X 2)     Status: None   Collection Time    01/12/14  6:06 AM      Result Value Ref Range Status   Specimen Description  BLOOD RIGHT ANTECUBITAL   Final   Special Requests BOTTLES DRAWN AEROBIC ONLY 3CC   Final   Culture  Setup Time     Final   Value: 01/12/2014 10:21     Performed at [facility]   Culture     Final   Value: STAPHYLOCOCCUS SPECIES (COAGULASE NEGATIVE)     Note: RIFAMPIN AND GENTAMICIN SHOULD NOT BE USED AS SINGLE DRUGS FOR TREATMENT OF STAPH INFECTIONS.     Note: Gram Stain Report Called to,Read Back By and Verified With: Tammy Fernandez [id number] BRMEL     Performed at [facility]   Report Status 01/15/2014 FINAL   Final   Organism ID, Bacteria STAPHYLOCOCCUS SPECIES (COAGULASE NEGATIVE)   Final  MRSA PCR SCREENING     Status: None   Collection Time    01/12/14  6:35 AM      Result Value Ref Range Status   MRSA by PCR NEGATIVE  NEGATIVE Final   Comment:            The GeneXpert MRSA Assay (FDA     approved for NASAL specimens     only), is one component of a     comprehensive MRSA colonization     surveillance program. It is not     intended to diagnose MRSA     infection nor to guide or     monitor treatment for     MRSA infections.    Studies/Results: Mr Tammy Fernandez Contrast  01/14/2014   CLINICAL DATA:  80 year old female with seizure. Possible abnormal right temporal lobe. Possible stroke. Initial encounter.  EXAM: MRI HEAD WITHOUT AND WITH CONTRAST  TECHNIQUE: Multiplanar, multiecho pulse sequences of the brain and surrounding structures were obtained without and with intravenous contrast.  CONTRAST:  15mL MULTIHANCE GADOBENATE DIMEGLUMINE 529 MG/ML IV SOLN  COMPARISON:  01/11/2014 brain MRI, MRA.  FINDINGS: Suspected cortical T2 hyperintensity in the right temporal lobe has regressed. No definite diffusion-weighted abnormality in this region today. Following contrast, no right temporal lobe enhancement identified.  There is a  small 8 mm cortical versus subcortical white matter focus of restricted diffusion in the right parietal lobe (series 3, image 19 and series  4, image 7).  No other diffusion abnormality identified. Major intracranial vascular flow voids are stable.  Elsewhere stable Tammy and white matter signal. No acute intracranial hemorrhage identified. No midline shift, mass effect, or evidence of intracranial mass lesion. No ventriculomegaly. Stable pituitary, cervicomedullary junction visualized cervical spine. No abnormal enhancement identified.  Normal bone marrow signal. Visualized scalp soft tissues are within normal limits. Visible internal auditory structures appear normal. Stable orbits soft tissues. Visualized paranasal sinuses and mastoids are clear.  IMPRESSION: 1. Small acute lacunar infarct in the right parietal lobe (posterior right MCA territory). No mass effect or hemorrhage. 2. Regressed abnormal signal in the right temporal lobe, and no diffusion changes or postcontrast enhancement. However, I consider this is real gyral abnormality, favored to be seizure sequelae in this setting. 3. No other new intracranial abnormality.   Electronically Signed   By: Augusto Gamble M.D.   On: 01/14/2014 19:48   Dg Abd Portable 1v  01/14/2014   CLINICAL DATA:  Check feeding tube placement  EXAM: PORTABLE ABDOMEN - 1 VIEW  COMPARISON:  None.  FINDINGS: A nasogastric catheter is noted just beyond the gastroesophageal junction. Scattered large and small bowel gas is noted. No obstructive changes are seen.   Electronically Signed   By: Alcide Clever M.D.   On: 01/14/2014 20:15    Assessment: She has methicillin-resistant coagulase-negative staph bacteremia and concern for septic emboli to her brain. She will need 6 weeks of IV vancomycin. If repeat blood cultures remained negative tomorrow I will go ahead and place PICC. I do not think it is worth proceeding with TEE since it is unlikely to alter her management. I talked to her family about this and they would prefer not to have her undergo a further procedures and I do not think she would be a candidate for valve  replacement surgery anyway.   Plan: 1. Continue vancomycin  2. Discontinue cefazolin 3. Consider PICC placement tomorrow if blood cultures remain negative  Cliffton Asters, MD [facility] [phone] pager   [phone] cell 01/15/2014, 10:59 AM

## 2014-01-15 NOTE — Progress Notes (Signed)
NUTRITION FOLLOW-UP/CONSULT  DOCUMENTATION CODES Per approved criteria  -Obesity Unspecified   INTERVENTION: Diet texture and liquid consistency per SLP. Discontinue Jevity 1.2. Will change to more fluid-restricted formula of Osmolite 1.5. Initiate Osmolite 1.5 at 25 via enteral feeding tube, advance by 10 ml q 4 hours, to goal rate of 45 ml/hr. Add 30 ml Prostat liquid protein via tube daily. Goal regimen will provide: 1720 kcal, 83 grams protein, 823 ml free water. RD to continue to follow nutrition care plan.  NUTRITION DIAGNOSIS: Inadequate oral intake related to inability to eat as evidenced by NPO status. Ongoing.  Goal: Diet advancement as tolerated vs initiation of nutrition support. Intake to meet >90% of estimated nutrition needs.  Monitor:  weight trends, lab trends, I/O's, diet advancement vs initiation of nutrition support  ASSESSMENT: PMHx significant for HLD, HTN, anxiety, GERD. Admitted after being found non-verbal, staring to the R and unable to move L side. Work-up reveals acute ischemic R MCA stroke.  Admission sodium was 115. Family states that pt she has been drinking large amounts of water for some time. Sodium is now 130.  Evaluated by CIR on 2/11.  BSE completed by SLP on 2/10, 2/12, and 2/13 with recommendations for NPO. Evaluation today, pt noted to not allow SLP to complete PO trials.  Blood pressure elevated this morning, pt also noted to be screaming and restless.  RD consulted for initiation of enteral nutrition. Discussed need for alternative formula with RN. Also, confirmed with Dr. Kathrene Bongo that pt needs fluid-restricted enteral formula.   Height: Ht Readings from Last 1 Encounters:  01/11/14 5\' 2"  (1.575 m)    Weight: Wt Readings from Last 1 Encounters:  01/11/14 190 lb 14.7 oz (86.6 kg)   BMI:  Body mass index is 34.91 kg/(m^2). Obese Class I  Estimated Nutritional Needs: Kcal: 1550 - 1750 Protein: 75 - 95 g Fluid: per  MD  Skin: intact  Diet Order:    EDUCATION NEEDS: -No education needs identified at this time   Intake/Output Summary (Last 24 hours) at 01/15/14 1000 Last data filed at 01/15/14 0723  Gross per 24 hour  Intake     50 ml  Output   2175 ml  Net  -2125 ml    Last BM: PTA  Labs:   Recent Labs Lab 01/12/14 1726 01/13/14 0223 01/14/14 0239 01/15/14 0442  NA 123* 125* 127* 130*  K 4.4 4.4 4.2 3.9  CL 82* 82* 86* 86*  CO2 28 29 26  35*  BUN 23 27* 24* 21  CREATININE 0.96 0.93 0.66 0.64  CALCIUM 9.5 9.4 9.4 9.8  MG  --   --   --  2.0  PHOS  --  3.3  --  2.4  GLUCOSE 159* 174* 130* 157*    CBG (last 3)   Recent Labs  01/15/14 0338 01/15/14 0551 01/15/14 0724  GLUCAP 142* 91 189*    Scheduled Meds: . aspirin  300 mg Rectal Daily   Or  . aspirin  325 mg Oral Daily  .  ceFAZolin (ANCEF) IV  2 g Intravenous 3 times per day  . furosemide  20 mg Intravenous Daily  . levothyroxine  25 mcg Intravenous Daily  . metoprolol  5 mg Intravenous 4 times per day  . pantoprazole (PROTONIX) IV  40 mg Intravenous QHS  . vancomycin  1,000 mg Intravenous Q24H    Continuous Infusions: . feeding supplement (JEVITY 1.2 CAL) 20 mL/hr at 01/14/14 2241   Jarold Motto MS,  RD, LDN Pager: [phone] After-hours pager: [phone]

## 2014-01-15 NOTE — Progress Notes (Signed)
Rehab admissions - Noted MRI head results of Small acute lacunar infarct in the right parietal lobe (posterior  right MCA territory). No mass effect or hemorrhage.  [facility] and submitted clinicals. Will keep pt/family and medical team updated on progress as we seek insurance authorization.   Please call me with any questions. Thanks.  Juliann MuleJanine Hayven Fatima, PT Rehabilitation Admissions Coordinator [phone]

## 2014-01-15 NOTE — Progress Notes (Signed)
Speech Language Pathology Treatment: Dysphagia;Cognitive-Linquistic  Patient Details Name: Tammy Fernandez MRN: [id number] DOB: 04-05-1924 Today's Date: 01/15/2014 Time: [id number] SLP Time Calculation (min): 14 min  Assessment / Plan / Recommendation Clinical Impression  Pt received dose of Ativan this am - max effort to facilitate participation is not successful.  Pt answering basic questions about self; some echolalia this am.  Attempted oral care and to provide ice chips; pt sealing lips shut and not following commands to allow cleaning of mouth nor PO trials.  Now with NG present for nutrition.    Etiology of deficits remains unclear per MD notes.  Repeat MRI shows acute infarct in right parietal lobe and questionable seizure sequelae in right temporal lobe.  SLP will continue to follow for improvements, readiness for PO consumption.  Will f/u Monday, 2/16.  Discussed with family.    HPI HPI: 80 y.o. female with a history of hypertension and hyperlipidemia who developed acute onset of inability to speak, gaze to the right and paralysis of left extremities       SLP Plan  Continue with current plan of care    Recommendations Diet recommendations: NPO Medication Administration: Via alternative means              Oral Care Recommendations: Oral care Q4 per protocol Plan: Continue with current plan of care   Tammy Fernandez, [place] CCC/SLP Pager [phone]      Blenda MountsCouture, Steffie Waggoner Laurice 01/15/2014, 9:58 AM

## 2014-01-15 NOTE — Progress Notes (Signed)
Pt's BP 180s-190s/90s. NP notified. New orders received.

## 2014-01-15 NOTE — Progress Notes (Signed)
Pt's BP 190s/90s-100s. NP notified. New orders received. Will carry out and continue to monitor.

## 2014-01-15 NOTE — Progress Notes (Signed)
Subjective: No events over night.  MRI completed shows small acute lacunar infarct in right parietal lobe. Also note signal change in right temporal lobe, ? Seizure sequelae.  Sodium is now 130 WBC decreased to 17.6  Infectious disease on board   Objective: Current vital signs: BP 105/50  Pulse 88  Temp(Src) 99.2 F (37.3 C) (Axillary)  Resp 25  Ht 5\' 2"  (1.575 m)  Wt 190 lb 14.7 oz (86.6 kg)  BMI 34.91 kg/m2  SpO2 100% Vital signs in last 24 hours: Temp:  [97.8 F (36.6 C)-99.2 F (37.3 C)] 99.2 F (37.3 C) (02/13 0721) Pulse Rate:  [88-111] 88 (02/13 0728) Resp:  [15-25] 25 (02/13 0728) BP: (105-199)/(39-94) 105/50 mmHg (02/13 0728) SpO2:  [96 %-100 %] 100 % (02/13 0728)  Intake/Output from previous day: 02/12 0701 - 02/13 0700 In: 50 [IV Piggyback:50] Out: 2025 [Urine:2025] Intake/Output this shift: Total I/O In: -  Out: 150 [Urine:150] Nutritional status:    Neurologic Exam: Mental Status: Alert, follows simple commands, able to to reach out and grab my hand with right arm and then touch her nose.  Will repeat what I asked her to do but unable to tell me where she is, date or year.  Cranial Nerves: II: blinks to threat bilaterally, pupils equal, round, reactive to light and accommodation III,IV, VI: ptosis not present, extra-ocular motions intact bilaterally but has a left gaze preference V,VII: facial droop , facial light touch sensation normal bilaterally VIII: hearing normal bilaterally IX,X: gag reflex present XI: not tested XII: midline tongue   Motor: Able to hold bilateral UE and LE antigravity, the right arm and leg are stronger than the left.  Tone remains increased bilaterally Sensory: Pinprick and light touch intact throughout, bilaterally Deep Tendon Reflexes:  Right: Upper Extremity   Left: Upper extremity   biceps (C-5 to C-6) 2/4   biceps (C-5 to C-6) 2/4 tricep (C7) 2/4    triceps (C7) 2/4 Brachioradialis (C6) 2/4  Brachioradialis (C6)  2/4  Lower Extremity Lower Extremity  quadriceps (L-2 to L-4) 1/4   quadriceps (L-2 to L-4) 1/4 Achilles (S1) 0/4   Achilles (S1) 0/4  Plantars: Mute bilaterally   Lab Results: Basic Metabolic Panel:  Recent Labs Lab 01/12/14 0940 01/12/14 1330 01/12/14 1726 01/13/14 0223 01/14/14 0239  NA 125* 124* 123* 125* 127*  K 4.5 4.7 4.4 4.4 4.2  CL 83* 83* 82* 82* 86*  CO2 30 28 28 29 26   GLUCOSE 154* 142* 159* 174* 130*  BUN 17 18 23  27* 24*  CREATININE 0.78 0.83 0.96 0.93 0.66  CALCIUM 9.4 9.4 9.5 9.4 9.4  PHOS  --   --   --  3.3  --     Liver Function Tests:  Recent Labs Lab 01/11/14 1544 01/13/14 0223  AST 22  --   ALT 11  --   ALKPHOS 89  --   BILITOT 0.5  --   PROT 8.3  --   ALBUMIN 3.2* 3.0*   No results found for this basename: LIPASE, AMYLASE,  in the last 168 hours No results found for this basename: AMMONIA,  in the last 168 hours  CBC:  Recent Labs Lab 01/11/14 1544 01/11/14 1709 01/14/14 0239  WBC 11.9*  --  24.9*  NEUTROABS 8.8*  --   --   HGB 15.0 17.3* 12.2  HCT 41.5 51.0* 35.7*  MCV 84.7  --  87.5  PLT 321  --  290    Cardiac Enzymes:  Recent Labs Lab 01/11/14 1544  TROPONINI <0.30    Lipid Panel:  Recent Labs Lab 01/12/14 0606  CHOL 165  TRIG 79  HDL 38*  CHOLHDL 4.3  VLDL 16  LDLCALC 161*    CBG:  Recent Labs Lab 01/12/14 1233 01/13/14 0025 01/15/14 0338 01/15/14 0551 01/15/14 0724  GLUCAP 150* 99 142* 91 189*    Microbiology: Results for orders placed during the hospital encounter of 01/11/14  URINE CULTURE     Status: None   Collection Time    01/11/14  7:51 PM      Result Value Ref Range Status   Specimen Description URINE, CATHETERIZED   Final   Special Requests NONE   Final   Culture  Setup Time     Final   Value: 01/11/2014 21:34     Performed at [facility] Count     Final   Value: >=100,000 COLONIES/ML     Performed at [facility]   Culture     Final   Value:  ESCHERICHIA COLI     Performed at [facility]   Report Status 01/13/2014 FINAL   Final   Organism ID, Bacteria ESCHERICHIA COLI   Final  CULTURE, BLOOD (ROUTINE X 2)     Status: None   Collection Time    01/12/14  6:00 AM      Result Value Ref Range Status   Specimen Description BLOOD RIGHT HAND   Final   Special Requests BOTTLES DRAWN AEROBIC ONLY 5CC   Final   Culture  Setup Time     Final   Value: 01/12/2014 10:21     Performed at [facility]   Culture     Final   Value: STAPHYLOCOCCUS SPECIES (COAGULASE NEGATIVE)     Note: SUSCEPTIBILITIES PERFORMED ON PREVIOUS CULTURE WITHIN THE LAST 5 DAYS.     Note: Gram Stain Report Called to,Read Back By and Verified With: Gray Bernhardt [id number] BRMEL     Performed at [facility]   Report Status 01/15/2014 FINAL   Final  CULTURE, BLOOD (ROUTINE X 2)     Status: None   Collection Time    01/12/14  6:06 AM      Result Value Ref Range Status   Specimen Description BLOOD RIGHT ANTECUBITAL   Final   Special Requests BOTTLES DRAWN AEROBIC ONLY 3CC   Final   Culture  Setup Time     Final   Value: 01/12/2014 10:21     Performed at [facility]   Culture     Final   Value: STAPHYLOCOCCUS SPECIES (COAGULASE NEGATIVE)     Note: RIFAMPIN AND GENTAMICIN SHOULD NOT BE USED AS SINGLE DRUGS FOR TREATMENT OF STAPH INFECTIONS.     Note: Gram Stain Report Called to,Read Back By and Verified With: Kerby Moors [id number] BRMEL     Performed at [facility]   Report Status 01/15/2014 FINAL   Final   Organism ID, Bacteria STAPHYLOCOCCUS SPECIES (COAGULASE NEGATIVE)   Final  MRSA PCR SCREENING     Status: None   Collection Time    01/12/14  6:35 AM      Result Value Ref Range Status   MRSA by PCR NEGATIVE  NEGATIVE Final   Comment:            The GeneXpert MRSA Assay (FDA     approved for NASAL specimens     only), is one  component of a     comprehensive MRSA colonization     surveillance program. It is  not     intended to diagnose MRSA     infection nor to guide or     monitor treatment for     MRSA infections.    Coagulation Studies: No results found for this basename: LABPROT, INR,  in the last 72 hours  Imaging: Mr Lodema PilotBrain W Wo Contrast  01/14/2014   CLINICAL DATA:  80 year old female with seizure. Possible abnormal right temporal lobe. Possible stroke. Initial encounter.  EXAM: MRI HEAD WITHOUT AND WITH CONTRAST  TECHNIQUE: Multiplanar, multiecho pulse sequences of the brain and surrounding structures were obtained without and with intravenous contrast.  CONTRAST:  15mL MULTIHANCE GADOBENATE DIMEGLUMINE 529 MG/ML IV SOLN  COMPARISON:  01/11/2014 brain MRI, MRA.  FINDINGS: Suspected cortical T2 hyperintensity in the right temporal lobe has regressed. No definite diffusion-weighted abnormality in this region today. Following contrast, no right temporal lobe enhancement identified.  There is a small 8 mm cortical versus subcortical white matter focus of restricted diffusion in the right parietal lobe (series 3, image 19 and series 4, image 7).  No other diffusion abnormality identified. Major intracranial vascular flow voids are stable.  Elsewhere stable gray and white matter signal. No acute intracranial hemorrhage identified. No midline shift, mass effect, or evidence of intracranial mass lesion. No ventriculomegaly. Stable pituitary, cervicomedullary junction visualized cervical spine. No abnormal enhancement identified.  Normal bone marrow signal. Visualized scalp soft tissues are within normal limits. Visible internal auditory structures appear normal. Stable orbits soft tissues. Visualized paranasal sinuses and mastoids are clear.  IMPRESSION: 1. Small acute lacunar infarct in the right parietal lobe (posterior right MCA territory). No mass effect or hemorrhage. 2. Regressed abnormal signal in the right temporal lobe, and no diffusion changes or postcontrast enhancement. However, I consider this  is real gyral abnormality, favored to be seizure sequelae in this setting. 3. No other new intracranial abnormality.   Electronically Signed   By: Augusto GambleLee  Hall M.D.   On: 01/14/2014 19:48   Dg Abd Portable 1v  01/14/2014   CLINICAL DATA:  Check feeding tube placement  EXAM: PORTABLE ABDOMEN - 1 VIEW  COMPARISON:  None.  FINDINGS: A nasogastric catheter is noted just beyond the gastroesophageal junction. Scattered large and small bowel gas is noted. No obstructive changes are seen.   Electronically Signed   By: Alcide CleverMark  Lukens M.D.   On: 01/14/2014 20:15    Medications:  Scheduled: . aspirin  300 mg Rectal Daily   Or  . aspirin  325 mg Oral Daily  .  ceFAZolin (ANCEF) IV  2 g Intravenous 3 times per day  . furosemide  20 mg Intravenous Daily  . levothyroxine  25 mcg Intravenous Daily  . metoprolol  5 mg Intravenous 4 times per day  . pantoprazole (PROTONIX) IV  40 mg Intravenous QHS  . vancomycin  1,000 mg Intravenous Q24H    Assessment/Plan: 80 year old female presenting with a hemiparesis.  Etiology unclear .  Initial MRI findings questionable but repeat MRI shows acute infarct in right parietal lobe and questionable seizure sequelae in right temporal lobe. With history of bacteremia would question if this represents a septic emobli that initially triggered seizure activity.  Patient on ASA.  Infectious diseases felt Acyclovir and Ceftriaxone unnecessary.  Patient now on Cefazolin.  Initial Echocardiogram unremarkable.    Recommendations: 1.  Continue ASA 2. LDL 111, would start statin prior  to discharge 3. Initial TTE negative, would consider TEE to rule out septic emboli 4. Rehab evaluation   LOS: 4 days   Elspeth Cho, DO Neurology-Stroke

## 2014-01-16 LAB — BASIC METABOLIC PANEL
BUN: 27 mg/dL — AB (ref 6–23)
CO2: 37 mEq/L — ABNORMAL HIGH (ref 19–32)
CREATININE: 0.71 mg/dL (ref 0.50–1.10)
Calcium: 10.2 mg/dL (ref 8.4–10.5)
Chloride: 88 mEq/L — ABNORMAL LOW (ref 96–112)
GFR calc Af Amer: 87 mL/min — ABNORMAL LOW (ref >90)
GFR calc non Af Amer: 75 mL/min — ABNORMAL LOW (ref >90)
Glucose, Bld: 175 mg/dL — ABNORMAL HIGH (ref 70–99)
Potassium: 3.6 mEq/L — ABNORMAL LOW (ref 3.7–5.3)
Sodium: 134 mEq/L — ABNORMAL LOW (ref 137–147)

## 2014-01-16 LAB — VANCOMYCIN, TROUGH: Vancomycin Tr: 10.3 ug/mL (ref 10.0–20.0)

## 2014-01-16 LAB — CBC
HEMATOCRIT: 38.5 % (ref 36.0–46.0)
Hemoglobin: 12.5 g/dL (ref 12.0–15.0)
MCH: 29.9 pg (ref 26.0–34.0)
MCHC: 32.5 g/dL (ref 30.0–36.0)
MCV: 92.1 fL (ref 78.0–100.0)
Platelets: 281 10*3/uL (ref 150–400)
RBC: 4.18 MIL/uL (ref 3.87–5.11)
RDW: 13.5 % (ref 11.5–15.5)
WBC: 14 10*3/uL — ABNORMAL HIGH (ref 4.0–10.5)

## 2014-01-16 LAB — GLUCOSE, CAPILLARY
GLUCOSE-CAPILLARY: 143 mg/dL — AB (ref 70–99)
GLUCOSE-CAPILLARY: 208 mg/dL — AB (ref 70–99)
Glucose-Capillary: 129 mg/dL — ABNORMAL HIGH (ref 70–99)
Glucose-Capillary: 170 mg/dL — ABNORMAL HIGH (ref 70–99)
Glucose-Capillary: 180 mg/dL — ABNORMAL HIGH (ref 70–99)
Glucose-Capillary: 194 mg/dL — ABNORMAL HIGH (ref 70–99)
Glucose-Capillary: 225 mg/dL — ABNORMAL HIGH (ref 70–99)

## 2014-01-16 MED ORDER — VANCOMYCIN HCL 10 G IV SOLR
1500.0000 mg | INTRAVENOUS | Status: DC
  Administered 2014-01-16 – 2014-01-19 (×4): 1500 mg via INTRAVENOUS
  Filled 2014-01-16 (×6): qty 1500

## 2014-01-16 MED ORDER — PANTOPRAZOLE SODIUM 40 MG PO PACK
40.0000 mg | PACK | Freq: Every day | ORAL | Status: DC
  Administered 2014-01-16 – 2014-01-18 (×3): 40 mg
  Filled 2014-01-16 (×4): qty 20

## 2014-01-16 NOTE — Progress Notes (Signed)
Progress Note [place] TEAM 1 - Stepdown/ICU TEAM   BINA VEENSTRA ZOX:[id number] DOB: [birth year] DOA: 01/11/2014 PCP: [name], MD  Brief narrative: 80 y.o. female presenting on 01/11/2014 with a past medical history of Hyperlipidemia; Hypertension; Anxiety; GERD (gastroesophageal reflux disease); Abnormal heart rhythms; Fatty liver; and Internal hemorrhoid who was noted by family to be nonverbal, looking to the right and not moving her left side at 2:30 PM after last being seen normal at 10 AM. CT head in ER was negative. She was also noted to have a low sodium and a UTI.   Subjective: Alert, slept well last night- appears more oriented than yesterday. No complaints.   Assessment/Plan:  Left side weakness/encephalopathy/smal Lacunar infarct R parietal lobe - Initial MRI equivocal but FU positive for small CVA -incidentally signal change right temporal lobe ?? Seizure related -TTE negative for vegetation but ID rec treat presumptively and does not recommend TEE -Neuro treating as possible meningitis-?? Dc Rocephin and Acyclovir -ID does not favor pursuit of LP since would not change tx outcome -PT/OT eval- CIR consulted  Sepsis due to E Coli UTI and Methacillin resistant coag-negative staph bacteremia -ID recomends Vanc and PICC which was attempted but not able to be placed by PICC team- will need IR to perform procedure -recent issues with persistent sinusitis prior to admission could be source -essentially pan sensitive e coli (resistant to amp)  Acute dysphagia -cont TF - titrated up to goal rate 45/hr- tolerating well -suspect as infections are effectively treated and with cont'd FU per SLP may eventually pass swallow eval   Acute respiratory failure with hypoxia -seems r/t hypoventilation and AMS -tol O2 wean -no focal infiltrate on CXR    Hypertension - cont IV Metoprolol-dose increased  2/11    Hyponatremia - appears to have a chronic component upon  discussion with family - seemed dehydrated and sodium slowly improved with fluids - appreciate nephro consult- serum cortisol normal - Nephro feels now euvolemic and initiated fluid restriction 2/11- started Lasix 2/12 and as of 2/13 rec cont same plan    Hyperkalemia - resolved    Hematuria - likely due to UTI    Low TSH -  Free T4 slightly elevated so suspect sick euthyroid syndrome - cont usual dose Levothyroxine  -repeat TSH 8-12 weeks post dc    Code Status: Full code Family Communication: with daughters at bedside  Disposition Plan: follow in SDU   Consultants: neuro  Procedures: TTE 2/10 Left ventricle: The cavity size was normal. There was mild concentric hypertrophy. Systolic function was normal. The estimated ejection fraction was in the range of 55% to 60%. Wall motion was normal; there were no regional wall motion abnormalities. Doppler parameters are consistent with abnormal left ventricular relaxation (grade 1 diastolic dysfunction). - No cardiac source of embolism was identified, but cannot be ruled out on the basis of this examination. 1. Consider transesophageal echocardiography if clinically indicated. 2. Consider transesophageal echocardiography if clinically indicated in order to exclude intracardiac thrombus.    Antibiotics: Vancomycin 2/10 >>> Ceftriaxone 2/9 >>>2/11 Acyclovir 2/11 >>>2/11 Ancef- 2/11- 2/13  DVT prophylaxis: SCDs  Objective: Filed Weights   01/11/14 2230 01/16/14 0800  Weight: 86.6 kg (190 lb 14.7 oz) 86 kg (189 lb 9.5 oz)   Blood pressure 156/52, pulse 109, temperature 98.5 F (36.9 C), temperature source Oral, resp. rate 24, height 5\' 2"  (1.575 m), weight 86 kg (189 lb 9.5 oz), SpO2 99.00%.  Intake/Output Summary (Last 24 hours) at  01/16/14 1557 Last data filed at 01/16/14 1556  Gross per 24 hour  Intake 1621.25 ml  Output   2275 ml  Net -653.75 ml     Exam: General: No acute respiratory  distress-alert Lungs: Clear to auscultation bilaterally without wheezes or crackles-  Cardiovascular: Regular rate and rhythm without murmur gallop or rub normal S1 and S2 Abdomen: Nontender, nondistended, soft, bowel sounds positive, no rebound, no ascites, no appreciable mass Extremities: No significant cyanosis, clubbing, or edema bilateral lower extremities Neuro: left sided neglect, left facial droop- moving left side much better now- 4/5 strength  Data Reviewed: Basic Metabolic Panel:  Recent Labs Lab 01/12/14 1726 01/13/14 0223 01/14/14 0239 01/15/14 0442 01/16/14 0437  NA 123* 125* 127* 130* 134*  K 4.4 4.4 4.2 3.9 3.6*  CL 82* 82* 86* 86* 88*  CO2 28 29 26  35* 37*  GLUCOSE 159* 174* 130* 157* 175*  BUN 23 27* 24* 21 27*  CREATININE 0.96 0.93 0.66 0.64 0.71  CALCIUM 9.5 9.4 9.4 9.8 10.2  MG  --   --   --  2.0  --   PHOS  --  3.3  --  2.4  --    Liver Function Tests:  Recent Labs Lab 01/11/14 1544 01/13/14 0223  AST 22  --   ALT 11  --   ALKPHOS 89  --   BILITOT 0.5  --   PROT 8.3  --   ALBUMIN 3.2* 3.0*   No results found for this basename: LIPASE, AMYLASE,  in the last 168 hours No results found for this basename: AMMONIA,  in the last 168 hours  CBC:  Recent Labs Lab 01/11/14 1544 01/11/14 1709 01/14/14 0239 01/15/14 0845 01/16/14 0437  WBC 11.9*  --  24.9* 17.6* 14.0*  NEUTROABS 8.8*  --   --   --   --   HGB 15.0 17.3* 12.2 11.9* 12.5  HCT 41.5 51.0* 35.7* 35.5* 38.5  MCV 84.7  --  87.5 89.2 92.1  PLT 321  --  290 257 281   Cardiac Enzymes:  Recent Labs Lab 01/11/14 1544  TROPONINI <0.30   BNP (last 3 results) No results found for this basename: PROBNP,  in the last 8760 hours CBG:  Recent Labs Lab 01/15/14 1647 01/15/14 1946 01/16/14 01/16/14 0333 01/16/14 0752  GLUCAP 202* 191* 180* 170* 143*    Recent Results (from the past 240 hour(s))  URINE CULTURE     Status: None   Collection Time    01/11/14  7:51 PM      Result  Value Ref Range Status   Specimen Description URINE, CATHETERIZED   Final   Special Requests NONE   Final   Culture  Setup Time     Final   Value: 01/11/2014 21:34     [facility]   Colony Count     Final   Value: >=100,000 COLONIES/ML     [facility]   Culture     Final   Value: ESCHERICHIA COLI     [facility] at [facility]   Report Status 01/13/2014 FINAL   Final   Organism ID, Bacteria ESCHERICHIA COLI   Final  CULTURE, BLOOD (ROUTINE X 2)     Status: None   Collection Time    01/12/14  6:00 AM      Result Value Ref Range Status   Specimen Description BLOOD RIGHT HAND   Final   Special Requests  BOTTLES DRAWN AEROBIC ONLY 5CC   Final   Culture  Setup Time     Final   Value: 01/12/2014 10:21     Performed at [facility]   Culture     Final   Value: STAPHYLOCOCCUS SPECIES (COAGULASE NEGATIVE)     Note: SUSCEPTIBILITIES PERFORMED ON PREVIOUS CULTURE WITHIN THE LAST 5 DAYS.     Note: Gram Stain Report Called to,Read Back By and Verified With: Gray Bernhardt [id number] BRMEL     Performed at [facility]   Report Status 01/15/2014 FINAL   Final  CULTURE, BLOOD (ROUTINE X 2)     Status: None   Collection Time    01/12/14  6:06 AM      Result Value Ref Range Status   Specimen Description BLOOD RIGHT ANTECUBITAL   Final   Special Requests BOTTLES DRAWN AEROBIC ONLY 3CC   Final   Culture  Setup Time     Final   Value: 01/12/2014 10:21     Performed at [facility]   Culture     Final   Value: STAPHYLOCOCCUS SPECIES (COAGULASE NEGATIVE)     Note: RIFAMPIN AND GENTAMICIN SHOULD NOT BE USED AS SINGLE DRUGS FOR TREATMENT OF STAPH INFECTIONS.     Note: Gram Stain Report Called to,Read Back By and Verified With: Kerby Moors [id number] BRMEL     Performed at [facility]   Report Status 01/15/2014 FINAL   Final   Organism ID, Bacteria STAPHYLOCOCCUS SPECIES (COAGULASE NEGATIVE)   Final  MRSA PCR  SCREENING     Status: None   Collection Time    01/12/14  6:35 AM      Result Value Ref Range Status   MRSA by PCR NEGATIVE  NEGATIVE Final   Comment:            The GeneXpert MRSA Assay (FDA     approved for NASAL specimens     only), is one component of a     comprehensive MRSA colonization     surveillance program. It is not     intended to diagnose MRSA     infection nor to guide or     monitor treatment for     MRSA infections.     Studies:  Recent x-ray studies have been reviewed in detail by the Attending Physician  Scheduled Meds:  Scheduled Meds: .  stroke: mapping our early stages of recovery book   Does not apply Once  . aspirin  300 mg Rectal Daily   Or  . aspirin  325 mg Oral Daily  . feeding supplement (PRO-STAT SUGAR FREE 64)  30 mL Per Tube Daily  . furosemide  20 mg Intravenous Daily  . levothyroxine  50 mcg Per Tube QAC breakfast  . metoprolol tartrate  12.5 mg Per Tube BID  . pantoprazole sodium  40 mg Per Tube Daily  . vancomycin  1,500 mg Intravenous Q24H    Time spent on care of this patient: 35 min   Avelino Herren,MD [phone] 01/16/2014, 3:57 PM

## 2014-01-16 NOTE — Progress Notes (Signed)
Attempted PICC placement by 2 IVT RN's with 5 unsuccessful attempts.  Unable to gain access to veins due to constriction of vessels.  Staff RN notified of need to refer to IR for PICC placement.

## 2014-01-16 NOTE — Progress Notes (Signed)
Stroke Team Progress Note  HISTORY Tammy Fernandez is an 80 y.o. female with a history of hypertension and hyperlipidemia who developed acute onset of inability to speak, gaze to the right and paralysis of left extremities earlier today. Patient was last seen well at 9:30 a.m. on 01/11/2014. She has no previous history of stroke nor TIA. She's been taking aspirin daily. CT scan of her head showed no acute intracranial abnormality. NIH stroke score was 22.   LSN: 9:30 AM on 01/11/2014  tPA Given: No: Beyond time under for treatment consideration  MRankin: 4  SUBJECTIVE  The son reports that the patient's overall the same. He indicates to me that she has had a history of left leg weakness thought to be due to lumbar disc disease and also possible arthritis. She has had problems with this for a while and apparently surgery was suggested for the lumbar disc disease. Again, he reports that the patient developed acute alteration of consciousness/unresponsiveness with left hemiplegia. He does not report any shaking. There may have been some stiffening of the neck.  OBJECTIVE Most recent Vital Signs: Filed Vitals:   01/16/14 0332 01/16/14 0700 01/16/14 0800 01/16/14 1125  BP: 143/56  157/54 188/67  Pulse: 89  98 91  Temp: 98.3 F (36.8 C) 97.8 F (36.6 C)  98.5 F (36.9 C)  TempSrc: Axillary Oral  Oral  Resp: 17  18 20   Height:   5\' 2"  (1.575 m)   Weight:   86 kg (189 lb 9.5 oz)   SpO2: 100%  99% 100%   CBG (last 3)   Recent Labs  01/16/14 01/16/14 0333 01/16/14 0752  GLUCAP 180* 170* 143*    IV Fluid Intake:   . feeding supplement (OSMOLITE 1.5 CAL) 1,000 mL (01/16/14 1201)    MEDICATIONS  .  stroke: mapping our early stages of recovery book   Does not apply Once  . aspirin  300 mg Rectal Daily   Or  . aspirin  325 mg Oral Daily  . feeding supplement (PRO-STAT SUGAR FREE 64)  30 mL Per Tube Daily  . furosemide  20 mg Intravenous Daily  . levothyroxine  50 mcg Per Tube QAC  breakfast  . metoprolol tartrate  12.5 mg Per Tube BID  . pantoprazole sodium  40 mg Per Tube Daily  . vancomycin  1,500 mg Intravenous Q24H   PRN:  acetaminophen, acetaminophen, hydrALAZINE, LORazepam, senna-docusate, zolpidem  Diet:    tube feedings with liquids Activity:  Up with assistance DVT Prophylaxis:  SCDs  CLINICALLY SIGNIFICANT STUDIES Basic Metabolic Panel:  Recent Labs Lab 01/13/14 0223  01/15/14 0442 01/16/14 0437  NA 125*  < > 130* 134*  K 4.4  < > 3.9 3.6*  CL 82*  < > 86* 88*  CO2 29  < > 35* 37*  GLUCOSE 174*  < > 157* 175*  BUN 27*  < > 21 27*  CREATININE 0.93  < > 0.64 0.71  CALCIUM 9.4  < > 9.8 10.2  MG  --   --  2.0  --   PHOS 3.3  --  2.4  --   < > = values in this interval not displayed. Liver Function Tests:  Recent Labs Lab 01/11/14 1544 01/13/14 0223  AST 22  --   ALT 11  --   ALKPHOS 89  --   BILITOT 0.5  --   PROT 8.3  --   ALBUMIN 3.2* 3.0*   CBC:  Recent Labs  Lab 01/11/14 1544  01/15/14 0845 01/16/14 0437  WBC 11.9*  < > 17.6* 14.0*  NEUTROABS 8.8*  --   --   --   HGB 15.0  < > 11.9* 12.5  HCT 41.5  < > 35.5* 38.5  MCV 84.7  < > 89.2 92.1  PLT 321  < > 257 281  < > = values in this interval not displayed. Coagulation:  Recent Labs Lab 01/11/14 1544  LABPROT 13.1  INR 1.01   Cardiac Enzymes:  Recent Labs Lab 01/11/14 1544  TROPONINI <0.30   Urinalysis:  Recent Labs Lab 01/11/14 1951  COLORURINE YELLOW  LABSPEC 1.017  PHURINE 6.5  GLUCOSEU NEGATIVE  HGBUR LARGE*  BILIRUBINUR NEGATIVE  KETONESUR 15*  PROTEINUR 30*  UROBILINOGEN 0.2  NITRITE NEGATIVE  LEUKOCYTESUR MODERATE*   Lipid Panel    Component Value Date/Time   CHOL 165 01/12/2014 0606   TRIG 79 01/12/2014 0606   HDL 38* 01/12/2014 0606   CHOLHDL 4.3 01/12/2014 0606   VLDL 16 01/12/2014 0606   LDLCALC 111* 01/12/2014 0606   HgbA1C  Lab Results  Component Value Date   HGBA1C 6.6* 01/12/2014    Urine Drug Screen:     Component Value Date/Time    LABOPIA NONE DETECTED 01/11/2014 1951   COCAINSCRNUR NONE DETECTED 01/11/2014 1951   LABBENZ POSITIVE* 01/11/2014 1951   AMPHETMU NONE DETECTED 01/11/2014 1951   THCU NONE DETECTED 01/11/2014 1951   LABBARB NONE DETECTED 01/11/2014 1951    Alcohol Level:  Recent Labs Lab 01/11/14 1544  ETH <11    Mr [name] Wo Contrast 01/14/2014    1. Small acute lacunar infarct in the right parietal lobe (posterior right MCA territory). No mass effect or hemorrhage. 2. Regressed abnormal signal in the right temporal lobe, and no diffusion changes or postcontrast enhancement. However, I consider this is real gyral abnormality, favored to be seizure sequelae in this setting. 3. No other new intracranial abnormality.       Dg Abd Portable 1v 01/14/2014    A nasogastric catheter is noted just beyond the gastroesophageal junction. Scattered large and small bowel gas is noted. No obstructive changes are seen.      CT of the brain  01/11/2014  No intracranial hemorrhage.  Question tiny infarct superior left lenticular nucleus of questionable age.  No CT evidence of large acute infarct.  No intracranial mass lesion noted on this unenhanced exam.   2D Echocardiogram  ejection fraction 55-60%. No cardiac source of embolism identified  Carotid Doppler  pending  EKG  sinus tachycardia rate 107 beats per minute For complete results please see formal report.   Therapy Recommendations inpatient rehabilitation recommended. The patient has been evaluated by the rehabilitation M.D.  Physical Exam   Neurologic Exam:  Mental Status: she is awake and alert. She speaks in simple sentences and follow commands but is not completely oriented. She knows she is in [place] at the hospital but is not oriented to the name of the hospital or year. Alert, follows simple commands, able to to reach out and grab my hand with right arm and then touch her nose. Will repeat what I asked her to do but unable to tell me where she is,  date or year.  Cranial Nerves:  II: blinks to threat bilaterally, pupils equal, round, reactive to light and accommodation  III,IV, VI: ptosis not present, extra-ocular motions intact bilaterally but has a left gaze preference  V,VII: facial droop , facial light  touch sensation normal bilaterally  VIII: hearing normal bilaterally  IX,X: gag reflex present  XI: not tested  XII: midline tongue  Motor:  She actually has good strength throughout in both the upper and lower extremities bilaterally. Strength is graded as 4+/5 throughout.  Sensory: Pinprick and light touch intact throughout, bilaterally  Deep Tendon Reflexes:  Right: Upper Extremity Left: Upper extremity  biceps (C-5 to C-6) 2/4 biceps (C-5 to C-6) 2/4  tricep (C7) 2/4 triceps (C7) 2/4  Brachioradialis (C6) 2/4 Brachioradialis (C6) 2/4  Lower Extremity Lower Extremity  quadriceps (L-2 to L-4) 1/4 quadriceps (L-2 to L-4) 1/4  Achilles (S1) 0/4 Achilles (S1) 0/4  Plantars:  Mute bilaterally  The patient's brain MRI scan is reviewed person and shows a tiny increased signal involving the parietal area on the right side.  ASSESSMENT Ms. DONATA REDDICK is a 80 y.o. female presenting with mutism and left hemiparesis. TPA was not given as the patient was out of the window for therapy.  MRI - small acute lacunar infarct in the right parietal lobe (posterior right MCA territory). Infarct felt to be thrombotic secondary to small vessel disease..  On no antithrombotics prior to admission. Now on aspirin 325 mg orally every day for secondary stroke prevention. Patient with resultant left hemiparesis and confusion. Work up underway.    Hypertension  Hyperlipidemia cholesterol 165 LDL 111  Leukocytosis  Urinary tract infection  Will A1c 6.6  Tube feedings  EEG - Frontal slowing  Hospital day # 5  TREATMENT/PLAN  Continue aspirin 325 mg orally every day for secondary stroke prevention.  Awaiting possible rehabilitation  admission.  Carotid Dopplers pending  Consider statin for LDL of 111 ( history of breast or allergy )  The patient's MRI finding seems tiny and this seemed not to explain the patient's profound weakness/hemiplegia, speech arrest and confusion. It may be worthwhile repeating the EEG.  Delton See PA-C [facility] Pager [phone] 01/16/2014, 12:23 PM  I have personally obtained a history, examined the patient, evaluated imaging results, and formulated the assessment and plan of care. I agree with the above.

## 2014-01-16 NOTE — Progress Notes (Signed)
ANTIBIOTIC CONSULT NOTE - FOLLOW UP  Pharmacy Consult for vancomycin Indication: bacteremia  Labs:  Recent Labs  01/14/14 0239 01/15/14 0442 01/15/14 0845 01/16/14 0437  WBC 24.9*  --  17.6* 14.0*  HGB 12.2  --  11.9* 12.5  PLT 290  --  257 281  CREATININE 0.66 0.64  --  0.71   Estimated Creatinine Clearance: 50.6 ml/min (by C-G formula based on Cr of 0.71).  Recent Labs  01/16/14 0437  VANCOTROUGH 10.3     Microbiology: Recent Results (from the past 720 hour(s))  URINE CULTURE     Status: None   Collection Time    01/11/14  7:51 PM      Result Value Ref Range Status   Specimen Description URINE, CATHETERIZED   Final   Special Requests NONE   Final   Culture  Setup Time     Final   Value: 01/11/2014 21:34     Performed at [facility]   Colony Count     Final   Value: >=100,000 COLONIES/ML     Performed at [facility]   Culture     Final   Value: ESCHERICHIA COLI     Performed at [facility]   Report Status 01/13/2014 FINAL   Final   Organism ID, Bacteria ESCHERICHIA COLI   Final  CULTURE, BLOOD (ROUTINE X 2)     Status: None   Collection Time    01/12/14  6:00 AM      Result Value Ref Range Status   Specimen Description BLOOD RIGHT HAND   Final   Special Requests BOTTLES DRAWN AEROBIC ONLY 5CC   Final   Culture  Setup Time     Final   Value: 01/12/2014 10:21     Performed at [facility]   Culture     Final   Value: STAPHYLOCOCCUS SPECIES (COAGULASE NEGATIVE)     Note: SUSCEPTIBILITIES PERFORMED ON PREVIOUS CULTURE WITHIN THE LAST 5 DAYS.     Note: Gram Stain Report Called to,Read Back By and Verified With: Gray Bernhardt [id number] BRMEL     Performed at [facility]   Report Status 01/15/2014 FINAL   Final  CULTURE, BLOOD (ROUTINE X 2)     Status: None   Collection Time    01/12/14  6:06 AM      Result Value Ref Range Status   Specimen Description BLOOD RIGHT ANTECUBITAL   Final   Special Requests BOTTLES  DRAWN AEROBIC ONLY 3CC   Final   Culture  Setup Time     Final   Value: 01/12/2014 10:21     Performed at [facility]   Culture     Final   Value: STAPHYLOCOCCUS SPECIES (COAGULASE NEGATIVE)     Note: RIFAMPIN AND GENTAMICIN SHOULD NOT BE USED AS SINGLE DRUGS FOR TREATMENT OF STAPH INFECTIONS.     Note: Gram Stain Report Called to,Read Back By and Verified With: Kerby Moors [id number] BRMEL     Performed at [facility]   Report Status 01/15/2014 FINAL   Final   Organism ID, Bacteria STAPHYLOCOCCUS SPECIES (COAGULASE NEGATIVE)   Final  MRSA PCR SCREENING     Status: None   Collection Time    01/12/14  6:35 AM      Result Value Ref Range Status   MRSA by PCR NEGATIVE  NEGATIVE Final   Comment:            The  GeneXpert MRSA Assay (FDA     approved for NASAL specimens     only), is one component of a     comprehensive MRSA colonization     surveillance program. It is not     intended to diagnose MRSA     infection nor to guide or     monitor treatment for     MRSA infections.     Assessment: 80yo female subtherapeutic on vancomycin with initial dosing for bacteremia w/ endocarditis and meningitis still not ruled out; plan to d/c home on ABX soon.  Goal of Therapy:  Vancomycin trough level 15-20 mcg/ml  Plan:  Will change vancomycin to 1500mg  IV Q24H for calculated trough closer to 15 and continue to monitor.  Vernard GamblesVeronda Louretta Tantillo, PharmD, BCPS  01/16/2014,5:51 AM

## 2014-01-16 NOTE — Progress Notes (Signed)
This patient is receiving Protonix 40mg  IV daily. Based on criteria approved by the Pharmacy and Therapeutics Committee, this medication is being converted to the equivalent oral dose form. These criteria include:   . The patient is eating (either orally or per tube) and/or has been taking other orally administered medications for at least 24 hours.  . This patient has no evidence of active gastrointestinal bleeding or impaired GI absorption (gastrectomy, short bowel, patient on TNA or NPO).   If you have questions about this conversion, please contact the pharmacy department.  Toys 'R' UsKimberly Quintavis Brands, Pharm.D., BCPS Clinical Pharmacist Pager [phone] 01/16/2014 8:26 AM

## 2014-01-16 NOTE — Progress Notes (Signed)
Patient ID: Tammy Fernandez, female   DOB: 18-Jun-1924, 80 y.o.   MRN: [id number]         [facility]    Date of Admission:  01/11/2014   Total days of antibiotics 6         Principal Problem:   Bacteremia due to Staphylococcus Active Problems:   Left hemiplegia   Hypertension   Hyponatremia   Hyperkalemia   Hematuria   E. coli UTI (urinary tract infection)   Acute encephalopathy   Acute respiratory failure with hypoxia   Dysphagia, post-stroke   .  stroke: mapping our early stages of recovery book   Does not apply Once  . aspirin  300 mg Rectal Daily   Or  . aspirin  325 mg Oral Daily  . feeding supplement (PRO-STAT SUGAR FREE 64)  30 mL Per Tube Daily  . furosemide  20 mg Intravenous Daily  . levothyroxine  50 mcg Per Tube QAC breakfast  . metoprolol tartrate  12.5 mg Per Tube BID  . pantoprazole sodium  40 mg Per Tube Daily  . vancomycin  1,500 mg Intravenous Q24H     Past Medical History  Diagnosis Date  . Hyperlipidemia   . Hypertension   . Anxiety   . GERD (gastroesophageal reflux disease)   . Abnormal heart rhythms   . Fatty liver   . Internal hemorrhoid     History  Substance Use Topics  . Smoking status: Never Smoker   . Smokeless tobacco: Never Used  . Alcohol Use: No    Family History  Problem Relation Age of Onset  . Stroke Father   . Hypertension Father   . Colon cancer Cousin   . Colon cancer      Aunt  . Diabetes      aunt and cousins  . Heart disease      cousins    Allergies  Allergen Reactions  . Crestor [Rosuvastatin Calcium]   . Sulfa Drugs Cross Reactors Rash    Objective: Temp:  [97.4 F (36.3 C)-99 F (37.2 C)] 98.5 F (36.9 C) (02/14 1125) Pulse Rate:  [88-109] 109 (02/14 1315) Resp:  [17-24] 24 (02/14 1315) BP: (143-188)/(52-83) 156/52 mmHg (02/14 1315) SpO2:  [96 %-100 %] 99 % (02/14 1315) Weight:  [86 kg (189 lb 9.5 oz)] 86 kg (189 lb 9.5 oz) (02/14 0800)  General: She remains  confused Skin: No splinter or conjunctival hemorrhages Lungs: Clear Cor: Regular S1-S2 no murmurs Abdomen: Nontender  Lab Results Lab Results  Component Value Date   WBC 14.0* 01/16/2014   HGB 12.5 01/16/2014   HCT 38.5 01/16/2014   MCV 92.1 01/16/2014   PLT 281 01/16/2014    Lab Results  Component Value Date   CREATININE 0.71 01/16/2014   BUN 27* 01/16/2014   NA 134* 01/16/2014   K 3.6* 01/16/2014   CL 88* 01/16/2014   CO2 37* 01/16/2014    Lab Results  Component Value Date   ALT 11 01/11/2014   AST 22 01/11/2014   ALKPHOS 89 01/11/2014   BILITOT 0.5 01/11/2014      Microbiology: Recent Results (from the past 240 hour(s))  URINE CULTURE     Status: None   Collection Time    01/11/14  7:51 PM      Result Value Ref Range Status   Specimen Description URINE, CATHETERIZED   Final   Special Requests NONE   Final   Culture  Setup Time  Final   Value: 01/11/2014 21:34     [facility]formed [facility]   Colony Count     Final   Value: >=100,000 COLONIES/ML     [facility]   Culture     Final   Value: ESCHERICHIA COLI     [facility]   Report Status 01/13/2014 FINAL   Final   Organism ID, Bacteria ESCHERICHIA COLI   Final  CULTURE, BLOOD (ROUTINE X 2)     Status: None   Collection Time    01/12/14  6:00 AM      Result Value Ref Range Status   Specimen Description BLOOD RIGHT HAND   Final   Special Requests BOTTLES DRAWN AEROBIC ONLY 5CC   Final   Culture  Setup Time     Final   Value: 01/12/2014 10:21     [facility]   Culture     Final   Value: STAPHYLOCOCCUS SPECIES (COAGULASE NEGATIVE)     Note: SUSCEPTIBILITIES PERFORMED ON PREVIOUS CULTURE WITHIN THE LAST 5 DAYS.     Note: Gram Stain Report Called to,Read Back By and Verified With: Gray BernhardtENNE BROWN [id number] BRMEL     [facility]   Report Status 01/15/2014 FINAL   Final  CULTURE, BLOOD (ROUTINE X 2)     Status: None    Collection Time    01/12/14  6:06 AM      Result Value Ref Range Status   Specimen Description BLOOD RIGHT ANTECUBITAL   Final   Special Requests BOTTLES DRAWN AEROBIC ONLY 3CC   Final   Culture  Setup Time     Final   Value: 01/12/2014 10:21     [facility]   Culture     Final   Value: STAPHYLOCOCCUS SPECIES (COAGULASE NEGATIVE)     Note: RIFAMPIN AND GENTAMICIN SHOULD NOT BE USED AS SINGLE DRUGS FOR TREATMENT OF STAPH INFECTIONS.     Note: Gram Stain Report Called to,Read Back By and Verified With: Kerby MoorsENEE BROWN [id number] BRMEL     [facility]   Report Status 01/15/2014 FINAL   Final   Organism ID, Bacteria STAPHYLOCOCCUS SPECIES (COAGULASE NEGATIVE)   Final  MRSA PCR SCREENING     Status: None   Collection Time    01/12/14  6:35 AM      Result Value Ref Range Status   MRSA by PCR NEGATIVE  NEGATIVE Final   Comment:            The GeneXpert MRSA Assay (FDA     approved for NASAL specimens     only), is one component of a     comprehensive MRSA colonization     surveillance program. It is not     intended to diagnose MRSA     infection nor to guide or     monitor treatment for     MRSA infections.    Studies/Results: Mr Lodema PilotBrain W Wo Contrast  01/14/2014   CLINICAL DATA:  80 year old female with seizure. Possible abnormal right temporal lobe. Possible stroke. Initial encounter.  EXAM: MRI HEAD WITHOUT AND WITH CONTRAST  TECHNIQUE: Multiplanar, multiecho pulse sequences of the brain and surrounding structures were obtained without and with intravenous contrast.  CONTRAST:  15mL MULTIHANCE GADOBENATE DIMEGLUMINE 529 MG/ML IV SOLN  COMPARISON:  01/11/2014 brain MRI, MRA.  FINDINGS: Suspected cortical T2 hyperintensity in the right temporal  lobe has regressed. No definite diffusion-weighted abnormality in this region today. Following contrast, no right temporal lobe enhancement identified.  There is a small 8 mm cortical versus subcortical  white matter focus of restricted diffusion in the right parietal lobe (series 3, image 19 and series 4, image 7).  No other diffusion abnormality identified. Major intracranial vascular flow voids are stable.  Elsewhere stable gray and white matter signal. No acute intracranial hemorrhage identified. No midline shift, mass effect, or evidence of intracranial mass lesion. No ventriculomegaly. Stable pituitary, cervicomedullary junction visualized cervical spine. No abnormal enhancement identified.  Normal bone marrow signal. Visualized scalp soft tissues are within normal limits. Visible internal auditory structures appear normal. Stable orbits soft tissues. Visualized paranasal sinuses and mastoids are clear.  IMPRESSION: 1. Small acute lacunar infarct in the right parietal lobe (posterior right MCA territory). No mass effect or hemorrhage. 2. Regressed abnormal signal in the right temporal lobe, and no diffusion changes or postcontrast enhancement. However, I consider this is real gyral abnormality, favored to be seizure sequelae in this setting. 3. No other new intracranial abnormality.   Electronically Signed   By: Augusto Gamble M.D.   On: 01/14/2014 19:48   Dg Abd Portable 1v  01/14/2014   CLINICAL DATA:  Check feeding tube placement  EXAM: PORTABLE ABDOMEN - 1 VIEW  COMPARISON:  None.  FINDINGS: A nasogastric catheter is noted just beyond the gastroesophageal junction. Scattered large and small bowel gas is noted. No obstructive changes are seen.   Electronically Signed   By: Alcide Clever M.D.   On: 01/14/2014 20:15    Assessment: She has methicillin-resistant coagulase-negative staph bacteremia and concern for septic emboli to her brain. She will need 6 weeks of IV vancomycin.    Plan: 1. Continue vancomycin  2. I will followup on Monday, February 16  Cliffton Asters, MD [facility] [phone] pager   [phone] cell 01/16/2014, 2:04 PM

## 2014-01-17 DIAGNOSIS — I69991 Dysphagia following unspecified cerebrovascular disease: Secondary | ICD-10-CM

## 2014-01-17 LAB — GLUCOSE, CAPILLARY
Glucose-Capillary: 171 mg/dL — ABNORMAL HIGH (ref 70–99)
Glucose-Capillary: 173 mg/dL — ABNORMAL HIGH (ref 70–99)
Glucose-Capillary: 183 mg/dL — ABNORMAL HIGH (ref 70–99)
Glucose-Capillary: 187 mg/dL — ABNORMAL HIGH (ref 70–99)
Glucose-Capillary: 205 mg/dL — ABNORMAL HIGH (ref 70–99)
Glucose-Capillary: 224 mg/dL — ABNORMAL HIGH (ref 70–99)

## 2014-01-17 NOTE — Progress Notes (Signed)
Progress Note    Tammy Fernandez YQM:[id number] DOB: Feb 27, 1924 DOA: 01/11/2014 PCP: Tammy Fernandez,Tammy ALEXANDER, MD  Brief narrative: 80 y.o. female presenting on 01/11/2014 with a past medical history of Hyperlipidemia; Hypertension; Anxiety; GERD (gastroesophageal reflux disease); Abnormal heart rhythms; Fatty liver; and Internal hemorrhoid who was noted by family to be nonverbal, looking to the right and not moving her left side at 2:30 PM after last being seen normal at 10 AM. CT head in ER was negative. She was also noted to have a low sodium and a UTI.   Subjective: Alert, no pain, tolerating tube feeds  Assessment/Plan:  Left side weakness/encephalopathy/smal Lacunar infarct R parietal lobe - Initial MRI equivocal but FU positive for small CVA -incidentally signal change right temporal lobe ?? Seizure related -TTE negative for vegetation but ID rec treat presumptively and does not recommend TEE -Neuro treating as possible meningitis-?? Dc Rocephin and Acyclovir -ID does not favor pursuit of LP since would not change tx outcome -PT/OT eval- CIR consulted  Sepsis due to E Coli UTI and Methacillin resistant coag-negative staph bacteremia -ID recomends Vanc and PICC which was attempted but not able to be placed by PICC team- will need IR to perform procedure -recent issues with persistent sinusitis prior to admission could be source -essentially pan sensitive e coli (resistant to amp)  Acute dysphagia -cont TF - titrated up to goal rate 45/hr- tolerating well -suspect as infections are effectively treated and with cont'd FU per SLP may eventually pass swallow eval   Acute respiratory failure with hypoxia -seems r/t hypoventilation and AMS -tol O2 wean -no focal infiltrate on CXR    Hypertension - cont IV Metoprolol-dose increased  2/11    Hyponatremia - appears to have a chronic component upon discussion with family - seemed dehydrated and sodium slowly improved with fluids -  appreciate nephro consult- serum cortisol normal - Nephro feels now euvolemic and initiated fluid restriction 2/11- started Lasix 2/12 and as of 2/13 rec cont same plan    Hyperkalemia - resolved    Hematuria - likely due to UTI    Low TSH -  Free T4 slightly elevated so suspect sick euthyroid syndrome - cont usual dose Levothyroxine  -repeat TSH 8-12 weeks post dc    Code Status: Full code Family Communication: with grand-daughter at bedside  Disposition Plan: follow in SDU   Consultants: neuro  Procedures: TTE 2/10 Left ventricle: The cavity size was normal. There was mild concentric hypertrophy. Systolic function was normal. The estimated ejection fraction was in the range of 55% to 60%. Wall motion was normal; there were no regional wall motion abnormalities. Doppler parameters are consistent with abnormal left ventricular relaxation (grade 1 diastolic dysfunction). - No cardiac source of embolism was identified, but cannot be ruled out on the basis of this examination. 1. Consider transesophageal echocardiography if clinically indicated. 2. Consider transesophageal echocardiography if clinically indicated in order to exclude intracardiac thrombus.    Antibiotics: Vancomycin 2/10 >>> Ceftriaxone 2/9 >>>2/11 Acyclovir 2/11 >>>2/11 Ancef- 2/11- 2/13  DVT prophylaxis: SCDs  Objective: Filed Weights   01/11/14 2230 01/16/14 0800 01/16/14 2224  Weight: 86.6 kg (190 lb 14.7 oz) 86 kg (189 lb 9.5 oz) 86.5 kg (190 lb 11.2 oz)   Blood pressure 168/88, pulse 98, temperature 98.2 F (36.8 C), temperature source Oral, resp. rate 18, height 5\' 2"  (1.575 m), weight 86.5 kg (190 lb 11.2 oz), SpO2 98.00%.  Intake/Output Summary (Last 24 hours) at 01/17/14 1052 Last data filed  at 01/17/14 0900  Gross per 24 hour  Intake   1280 ml  Output   1775 ml  Net   -495 ml     Exam: General: No acute respiratory distress-alert Lungs: Clear to auscultation bilaterally  without wheezes or crackles-  Cardiovascular: Regular rate and rhythm without murmur gallop or rub normal S1 and S2 Abdomen: Nontender, nondistended, soft, bowel sounds positive, no rebound, no ascites, no appreciable mass Extremities: No significant cyanosis, clubbing, or edema bilateral lower extremities Neuro: left sided neglect, left facial droop- moving left side much better now- 4/5 strength  Data Reviewed: Basic Metabolic Panel:  Recent Labs Lab 01/12/14 1726 01/13/14 0223 01/14/14 0239 01/15/14 0442 01/16/14 0437  NA 123* 125* 127* 130* 134*  K 4.4 4.4 4.2 3.9 3.6*  CL 82* 82* 86* 86* 88*  CO2 28 29 26  35* 37*  GLUCOSE 159* 174* 130* 157* 175*  BUN 23 27* 24* 21 27*  CREATININE 0.96 0.93 0.66 0.64 0.71  CALCIUM 9.5 9.4 9.4 9.8 10.2  MG  --   --   --  2.0  --   PHOS  --  3.3  --  2.4  --    Liver Function Tests:  Recent Labs Lab 01/11/14 1544 01/13/14 0223  AST 22  --   ALT 11  --   ALKPHOS 89  --   BILITOT 0.5  --   PROT 8.3  --   ALBUMIN 3.2* 3.0*   No results found for this basename: LIPASE, AMYLASE,  in the last 168 hours No results found for this basename: AMMONIA,  in the last 168 hours  CBC:  Recent Labs Lab 01/11/14 1544 01/11/14 1709 01/14/14 0239 01/15/14 0845 01/16/14 0437  WBC 11.9*  --  24.9* 17.6* 14.0*  NEUTROABS 8.8*  --   --   --   --   HGB 15.0 17.3* 12.2 11.9* 12.5  HCT 41.5 51.0* 35.7* 35.5* 38.5  MCV 84.7  --  87.5 89.2 92.1  PLT 321  --  290 257 281   Cardiac Enzymes:  Recent Labs Lab 01/11/14 1544  TROPONINI <0.30   BNP (last 3 results) No results found for this basename: PROBNP,  in the last 8760 hours CBG:  Recent Labs Lab 01/16/14 1659 01/16/14 2004 01/16/14 2353 01/17/14 0406 01/17/14 0838  GLUCAP 225* 208* 171* 173* 205*    Recent Results (from the past 240 hour(s))  URINE CULTURE     Status: None   Collection Time    01/11/14  7:51 PM      Result Value Ref Range Status   Specimen Description  URINE, CATHETERIZED   Final   Special Requests NONE   Final   Culture  Setup Time     Final   Value: 01/11/2014 21:34     Performed at [facility]   Colony Count     Final   Value: >=100,000 COLONIES/ML     Performed at [facility]   Culture     Final   Value: ESCHERICHIA COLI     Performed at [facility]   Report Status 01/13/2014 FINAL   Final   Organism ID, Bacteria ESCHERICHIA COLI   Final  CULTURE, BLOOD (ROUTINE X 2)     Status: None   Collection Time    01/12/14  6:00 AM      Result Value Ref Range Status   Specimen Description BLOOD RIGHT HAND   Final   Special Requests  BOTTLES DRAWN AEROBIC ONLY 5CC   Final   Culture  Setup Time     Final   Value: 01/12/2014 10:21     Performed at [facility]   Culture     Final   Value: STAPHYLOCOCCUS SPECIES (COAGULASE NEGATIVE)     Note: SUSCEPTIBILITIES PERFORMED ON PREVIOUS CULTURE WITHIN THE LAST 5 DAYS.     Note: Gram Stain Report Called to,Read Back By and Verified With: Gray Bernhardt [id number] BRMEL     Performed at [facility]   Report Status 01/15/2014 FINAL   Final  CULTURE, BLOOD (ROUTINE X 2)     Status: None   Collection Time    01/12/14  6:06 AM      Result Value Ref Range Status   Specimen Description BLOOD RIGHT ANTECUBITAL   Final   Special Requests BOTTLES DRAWN AEROBIC ONLY 3CC   Final   Culture  Setup Time     Final   Value: 01/12/2014 10:21     Performed at [facility]   Culture     Final   Value: STAPHYLOCOCCUS SPECIES (COAGULASE NEGATIVE)     Note: RIFAMPIN AND GENTAMICIN SHOULD NOT BE USED AS SINGLE DRUGS FOR TREATMENT OF STAPH INFECTIONS.     Note: Gram Stain Report Called to,Read Back By and Verified With: Kerby Moors [id number] BRMEL     Performed at [facility]   Report Status 01/15/2014 FINAL   Final   Organism ID, Bacteria STAPHYLOCOCCUS SPECIES (COAGULASE NEGATIVE)   Final  MRSA PCR SCREENING     Status: None   Collection Time      01/12/14  6:35 AM      Result Value Ref Range Status   MRSA by PCR NEGATIVE  NEGATIVE Final   Comment:            The GeneXpert MRSA Assay (FDA     approved for NASAL specimens     only), is one component of a     comprehensive MRSA colonization     surveillance program. It is not     intended to diagnose MRSA     infection nor to guide or     monitor treatment for     MRSA infections.     Studies:  Recent x-ray studies have been reviewed in detail by the Attending Physician  Scheduled Meds:  Scheduled Meds: .  stroke: mapping our early stages of recovery book   Does not apply Once  . aspirin  300 mg Rectal Daily   Or  . aspirin  325 mg Oral Daily  . feeding supplement (PRO-STAT SUGAR FREE 64)  30 mL Per Tube Daily  . furosemide  20 mg Intravenous Daily  . levothyroxine  50 mcg Per Tube QAC breakfast  . metoprolol tartrate  12.5 mg Per Tube BID  . pantoprazole sodium  40 mg Per Tube Daily  . vancomycin  1,500 mg Intravenous Q24H    Time spent on care of this patient: 35 min   Chalmers Cater [phone]  01/17/2014, 10:52 AM

## 2014-01-17 NOTE — Progress Notes (Signed)
Stroke Team Progress Note  HISTORY Tammy Fernandez is an 80 y.o. female with a history of hypertension and hyperlipidemia who developed acute onset of inability to speak, gaze to the right and paralysis of left extremities earlier today. Patient was last seen well at 9:30 a.m. on 01/11/2014. She has no previous history of stroke nor TIA. She's been taking aspirin daily. CT scan of her head showed no acute intracranial abnormality. NIH stroke score was 22.   LSN: 9:30 AM on 01/11/2014  tPA Given: No: Beyond time under for treatment consideration  MRankin: 4  SUBJECTIVE   No new complaints are reported. She reports feeling better.  OBJECTIVE Most recent Vital Signs: Filed Vitals:   01/16/14 2224 01/17/14 0408 01/17/14 1000 01/17/14 1343  BP: 187/93 177/82 168/88 162/78  Pulse: 130 96 98 96  Temp:  97.8 F (36.6 C) 98.2 F (36.8 C) 98.2 F (36.8 C)  TempSrc:  Oral Oral Oral  Resp:  18 18 18   Height:      Weight: 86.5 kg (190 lb 11.2 oz)     SpO2: 96% 97% 98% 96%   CBG (last 3)   Recent Labs  01/17/14 0406 01/17/14 0838 01/17/14 1156  GLUCAP 173* 205* 183*    IV Fluid Intake:   . feeding supplement (OSMOLITE 1.5 CAL) 1,000 mL (01/16/14 1201)    MEDICATIONS  .  stroke: mapping our early stages of recovery book   Does not apply Once  . aspirin  300 mg Rectal Daily   Or  . aspirin  325 mg Oral Daily  . feeding supplement (PRO-STAT SUGAR FREE 64)  30 mL Per Tube Daily  . furosemide  20 mg Intravenous Daily  . levothyroxine  50 mcg Per Tube QAC breakfast  . metoprolol tartrate  12.5 mg Per Tube BID  . pantoprazole sodium  40 mg Per Tube Daily  . vancomycin  1,500 mg Intravenous Q24H   PRN:  acetaminophen, acetaminophen, hydrALAZINE, LORazepam, senna-docusate, zolpidem  Diet:    tube feedings with liquids Activity:  Up with assistance DVT Prophylaxis:  SCDs  CLINICALLY SIGNIFICANT STUDIES Basic Metabolic Panel:  Recent Labs Lab 01/13/14 0223  01/15/14 0442  01/16/14 0437  NA 125*  < > 130* 134*  K 4.4  < > 3.9 3.6*  CL 82*  < > 86* 88*  CO2 29  < > 35* 37*  GLUCOSE 174*  < > 157* 175*  BUN 27*  < > 21 27*  CREATININE 0.93  < > 0.64 0.71  CALCIUM 9.4  < > 9.8 10.2  MG  --   --  2.0  --   PHOS 3.3  --  2.4  --   < > = values in this interval not displayed. Liver Function Tests:   Recent Labs Lab 01/11/14 1544 01/13/14 0223  AST 22  --   ALT 11  --   ALKPHOS 89  --   BILITOT 0.5  --   PROT 8.3  --   ALBUMIN 3.2* 3.0*   CBC:  Recent Labs Lab 01/11/14 1544  01/15/14 0845 01/16/14 0437  WBC 11.9*  < > 17.6* 14.0*  NEUTROABS 8.8*  --   --   --   HGB 15.0  < > 11.9* 12.5  HCT 41.5  < > 35.5* 38.5  MCV 84.7  < > 89.2 92.1  PLT 321  < > 257 281  < > = values in this interval not displayed. Coagulation:   Recent  Labs Lab 01/11/14 1544  LABPROT 13.1  INR 1.01   Cardiac Enzymes:   Recent Labs Lab 01/11/14 1544  TROPONINI <0.30   Urinalysis:   Recent Labs Lab 01/11/14 1951  COLORURINE YELLOW  LABSPEC 1.017  PHURINE 6.5  GLUCOSEU NEGATIVE  HGBUR LARGE*  BILIRUBINUR NEGATIVE  KETONESUR 15*  PROTEINUR 30*  UROBILINOGEN 0.2  NITRITE NEGATIVE  LEUKOCYTESUR MODERATE*   Lipid Panel    Component Value Date/Time   CHOL 165 01/12/2014 0606   TRIG 79 01/12/2014 0606   HDL 38* 01/12/2014 0606   CHOLHDL 4.3 01/12/2014 0606   VLDL 16 01/12/2014 0606   LDLCALC 111* 01/12/2014 0606   HgbA1C  Lab Results  Component Value Date   HGBA1C 6.6* 01/12/2014    Urine Drug Screen:     Component Value Date/Time   LABOPIA NONE DETECTED 01/11/2014 1951   COCAINSCRNUR NONE DETECTED 01/11/2014 1951   LABBENZ POSITIVE* 01/11/2014 1951   AMPHETMU NONE DETECTED 01/11/2014 1951   THCU NONE DETECTED 01/11/2014 1951   LABBARB NONE DETECTED 01/11/2014 1951    Alcohol Level:   Recent Labs Lab 01/11/14 1544  ETH <11    Mr Tammy Fernandez Wo Contrast 01/14/2014    1. Small acute lacunar infarct in the right parietal lobe (posterior right MCA  territory). No mass effect or hemorrhage. 2. Regressed abnormal signal in the right temporal lobe, and no diffusion changes or postcontrast enhancement. However, I consider this is real gyral abnormality, favored to be seizure sequelae in this setting. 3. No other new intracranial abnormality.       Dg Abd Portable 1v 01/14/2014    A nasogastric catheter is noted just beyond the gastroesophageal junction. Scattered large and small bowel gas is noted. No obstructive changes are seen.      CT of the brain  01/11/2014  No intracranial hemorrhage.  Question tiny infarct superior left lenticular nucleus of questionable age.  No CT evidence of large acute infarct.  No intracranial mass lesion noted on this unenhanced exam.   2D Echocardiogram  ejection fraction 55-60%. No cardiac source of embolism identified  Carotid Doppler  pending  EKG  sinus tachycardia rate 107 beats per minute For complete results please see formal report.   Therapy Recommendations inpatient rehabilitation recommended. The patient has been evaluated by the rehabilitation M.D.  Physical Exam   Neurologic Exam:  Mental Status: she is awake and alert. She speaks in simple sentences and follow commands but is not completely oriented. She knows she is in [place] at the hospital but is not oriented to the name of the hospital or year. Alert, follows simple commands, able to to reach out and grab my hand with right arm and then touch her nose. Will repeat what I asked her to do but unable to tell me where she is, date or year.  Cranial Nerves:  II: blinks to threat bilaterally, pupils equal, round, reactive to light and accommodation  III,IV, VI: ptosis not present, extra-ocular motions intact bilaterally but has a left gaze preference  V,VII: facial droop , facial light touch sensation normal bilaterally  VIII: hearing normal bilaterally  IX,X: gag reflex present  XI: not tested  XII: midline tongue  Motor:  She  actually has good strength throughout in both the upper and lower extremities bilaterally. Strength is graded as 4+/5 throughout.  Sensory: Pinprick and light touch intact throughout, bilaterally  Deep Tendon Reflexes:  Right: Upper Extremity Left: Upper extremity  biceps (C-5 to C-6)  2/4 biceps (C-5 to C-6) 2/4  tricep (C7) 2/4 triceps (C7) 2/4  Brachioradialis (C6) 2/4 Brachioradialis (C6) 2/4  Lower Extremity Lower Extremity  quadriceps (L-2 to L-4) 1/4 quadriceps (L-2 to L-4) 1/4  Achilles (S1) 0/4 Achilles (S1) 0/4  Plantars:  Mute bilaterally  The patient's brain MRI scan is reviewed person and shows a tiny increased signal involving the parietal area on the right side.  ASSESSMENT Tammy Fernandez is a 80 y.o. female presenting with mutism and left hemiparesis. TPA was not given as the patient was out of the window for therapy.  MRI - small acute lacunar infarct in the right parietal lobe (posterior right MCA territory). Infarct felt to be thrombotic secondary to small vessel disease..  On no antithrombotics prior to admission. Now on aspirin 325 mg orally every day for secondary stroke prevention. Patient with resultant left hemiparesis and confusion. Work up underway.    Hypertension  Hyperlipidemia cholesterol 165 LDL 111  Leukocytosis  Urinary tract infection - Vancomycin   A1c 6.6  Tube feedings  EEG - Frontal slowing  Hospital day # 6  TREATMENT/PLAN  Continue aspirin 325 mg orally every day for secondary stroke prevention.  Awaiting possible rehabilitation admission.  Carotid Dopplers pending  Consider statin for LDL of 111 ( history of breast or allergy )  The patient's MRI finding seems tiny and this seemed not to explain the patient's profound weakness/hemiplegia, speech arrest and confusion. It may be worthwhile repeating the EEG.EEG is pending.  Delton Seeavid Rinehuls PA-C [facility] Pager [phone] 01/17/2014, 2:03 PM  I have  personally obtained a history, examined the patient, evaluated imaging results, and formulated the assessment and plan of care. I agree with the above.

## 2014-01-18 ENCOUNTER — Inpatient Hospital Stay (HOSPITAL_COMMUNITY): Payer: PRIVATE HEALTH INSURANCE

## 2014-01-18 LAB — BASIC METABOLIC PANEL
BUN: 25 mg/dL — ABNORMAL HIGH (ref 6–23)
CALCIUM: 11.3 mg/dL — AB (ref 8.4–10.5)
CHLORIDE: 90 meq/L — AB (ref 96–112)
CREATININE: 0.62 mg/dL (ref 0.50–1.10)
GFR calc Af Amer: >90 mL/min (ref >90)
GFR calc non Af Amer: 79 mL/min — ABNORMAL LOW (ref >90)
GLUCOSE: 225 mg/dL — AB (ref 70–99)
Potassium: 3.4 mEq/L — ABNORMAL LOW (ref 3.7–5.3)
SODIUM: 144 meq/L (ref 137–147)

## 2014-01-18 LAB — CBC
HCT: 43 % (ref 36.0–46.0)
HEMOGLOBIN: 13.8 g/dL (ref 12.0–15.0)
MCH: 30.5 pg (ref 26.0–34.0)
MCHC: 32.1 g/dL (ref 30.0–36.0)
MCV: 94.9 fL (ref 78.0–100.0)
PLATELETS: 325 10*3/uL (ref 150–400)
RBC: 4.53 MIL/uL (ref 3.87–5.11)
RDW: 14.2 % (ref 11.5–15.5)
WBC: 16.9 10*3/uL — AB (ref 4.0–10.5)

## 2014-01-18 LAB — BLOOD GAS, ARTERIAL
Acid-Base Excess: 23 mmol/L — ABNORMAL HIGH (ref 0.0–2.0)
Bicarbonate: 48.7 mEq/L — ABNORMAL HIGH (ref 20.0–24.0)
DRAWN BY: 12206
FIO2: 0.21 %
O2 SAT: 89.9 %
Patient temperature: 98.6
TCO2: 50.7 mmol/L (ref 0–100)
pCO2 arterial: 65 mmHg (ref 35.0–45.0)
pH, Arterial: 7.487 — ABNORMAL HIGH (ref 7.350–7.450)
pO2, Arterial: 54.7 mmHg — ABNORMAL LOW (ref 80.0–100.0)

## 2014-01-18 LAB — GLUCOSE, CAPILLARY
GLUCOSE-CAPILLARY: 146 mg/dL — AB (ref 70–99)
GLUCOSE-CAPILLARY: 122 mg/dL — AB (ref 70–99)
Glucose-Capillary: 162 mg/dL — ABNORMAL HIGH (ref 70–99)
Glucose-Capillary: 223 mg/dL — ABNORMAL HIGH (ref 70–99)
Glucose-Capillary: 230 mg/dL — ABNORMAL HIGH (ref 70–99)

## 2014-01-18 MED ORDER — DEXTROSE 5 % IV SOLN
500.0000 mg | Freq: Once | INTRAVENOUS | Status: AC
  Administered 2014-01-18: 500 mg via INTRAVENOUS
  Filled 2014-01-18: qty 5

## 2014-01-18 MED ORDER — METOPROLOL TARTRATE 25 MG/10 ML ORAL SUSPENSION
25.0000 mg | Freq: Two times a day (BID) | ORAL | Status: DC
  Administered 2014-01-18 (×2): 25 mg
  Filled 2014-01-18 (×4): qty 10

## 2014-01-18 MED ORDER — SODIUM CHLORIDE 0.9 % IV SOLN: INTRAVENOUS | Status: DC

## 2014-01-18 NOTE — Progress Notes (Signed)
Patient ID: Tammy Fernandez, female   DOB: [birth year], 80 y.o.   MRN: [id number]         [facility] Infectious [facility]    Date of Admission:  01/11/2014   Total days of antibiotics 8         Principal Problem:   Bacteremia due to Staphylococcus Active Problems:   Left hemiplegia   Hypertension   Hyponatremia   Hyperkalemia   Hematuria   E. coli UTI (urinary tract infection)   Acute encephalopathy   Acute respiratory failure with hypoxia   Dysphagia, post-stroke   .  stroke: mapping our early stages of recovery book   Does not apply Once  . aspirin  300 mg Rectal Daily   Or  . aspirin  325 mg Oral Daily  . feeding supplement (PRO-STAT SUGAR FREE 64)  30 mL Per Tube Daily  . furosemide  20 mg Intravenous Daily  . levothyroxine  50 mcg Per Tube QAC breakfast  . methocarbamol (ROBAXIN) IV  500 mg Intravenous Once  . metoprolol tartrate  25 mg Per Tube BID  . pantoprazole sodium  40 mg Per Tube Daily  . vancomycin  1,500 mg Intravenous Q24H   Subjective: She is much more alert and talking with her daughter, Harvie Bridge  Past Medical History  Diagnosis Date  . Hyperlipidemia   . Hypertension   . Anxiety   . GERD (gastroesophageal reflux disease)   . Abnormal heart rhythms   . Fatty liver   . Internal hemorrhoid     History  Substance Use Topics  . Smoking status: Never Smoker   . Smokeless tobacco: Never Used  . Alcohol Use: No    Family History  Problem Relation Age of Onset  . Stroke Father   . Hypertension Father   . Colon cancer Cousin   . Colon cancer      Aunt  . Diabetes      aunt and cousins  . Heart disease      cousins    Allergies  Allergen Reactions  . Crestor [Rosuvastatin Calcium]   . Sulfa Drugs Cross Reactors Rash    Objective: Temp:  [98.2 F (36.8 C)-99 F (37.2 C)] 99 F (37.2 C) (02/16 0434) Pulse Rate:  [96-118] 110 (02/16 0434) Resp:  [18-20] 19 (02/16 0434) BP: (156-184)/(64-89) 180/89 mmHg (02/16 0434) SpO2:  [93 %-98  %] 98 % (02/16 0434) Weight:  [81.7 kg (180 lb 1.9 oz)] 81.7 kg (180 lb 1.9 oz) (02/15 2019)  General: She is alert and smiling Skin: No splinter or conjunctival hemorrhages Lungs: Clear Cor: Regular S1-S2 no murmurs Abdomen: Nontender Improved strength on left side  Lab Results Lab Results  Component Value Date   WBC 14.0* 01/16/2014   HGB 12.5 01/16/2014   HCT 38.5 01/16/2014   MCV 92.1 01/16/2014   PLT 281 01/16/2014    Lab Results  Component Value Date   CREATININE 0.71 01/16/2014   BUN 27* 01/16/2014   NA 134* 01/16/2014   K 3.6* 01/16/2014   CL 88* 01/16/2014   CO2 37* 01/16/2014    Lab Results  Component Value Date   ALT 11 01/11/2014   AST 22 01/11/2014   ALKPHOS 89 01/11/2014   BILITOT 0.5 01/11/2014      Microbiology: Recent Results (from the past 240 hour(s))  URINE CULTURE     Status: None   Collection Time    01/11/14  7:51 PM      Result  Value Ref Range Status   Specimen Description URINE, CATHETERIZED   Final   Special Requests NONE   Final   Culture  Setup Time     Final   Value: 01/11/2014 21:34     Performed at [facility]   Colony Count     Final   Value: >=100,000 COLONIES/ML     Performed at [facility]   Culture     Final   Value: ESCHERICHIA COLI     Performed at [facility]   Report Status 01/13/2014 FINAL   Final   Organism ID, Bacteria ESCHERICHIA COLI   Final  CULTURE, BLOOD (ROUTINE X 2)     Status: None   Collection Time    01/12/14  6:00 AM      Result Value Ref Range Status   Specimen Description BLOOD RIGHT HAND   Final   Special Requests BOTTLES DRAWN AEROBIC ONLY 5CC   Final   Culture  Setup Time     Final   Value: 01/12/2014 10:21     Performed at [facility]   Culture     Final   Value: STAPHYLOCOCCUS SPECIES (COAGULASE NEGATIVE)     Note: SUSCEPTIBILITIES PERFORMED ON PREVIOUS CULTURE WITHIN THE LAST 5 DAYS.     Note: Gram Stain Report Called to,Read Back By and Verified With: Gray Bernhardt [id number]  [id number] BRMEL     Performed at [facility]   Report Status 01/15/2014 FINAL   Final  CULTURE, BLOOD (ROUTINE X 2)     Status: None   Collection Time    01/12/14  6:06 AM      Result Value Ref Range Status   Specimen Description BLOOD RIGHT ANTECUBITAL   Final   Special Requests BOTTLES DRAWN AEROBIC ONLY 3CC   Final   Culture  Setup Time     Final   Value: 01/12/2014 10:21     Performed at [facility]   Culture     Final   Value: STAPHYLOCOCCUS SPECIES (COAGULASE NEGATIVE)     Note: RIFAMPIN AND GENTAMICIN SHOULD NOT BE USED AS SINGLE DRUGS FOR TREATMENT OF STAPH INFECTIONS.     Note: Gram Stain Report Called to,Read Back By and Verified With: Kerby Moors [id number] BRMEL     Performed at [facility]   Report Status 01/15/2014 FINAL   Final   Organism ID, Bacteria STAPHYLOCOCCUS SPECIES (COAGULASE NEGATIVE)   Final  MRSA PCR SCREENING     Status: None   Collection Time    01/12/14  6:35 AM      Result Value Ref Range Status   MRSA by PCR NEGATIVE  NEGATIVE Final   Comment:            The GeneXpert MRSA Assay (FDA     approved for NASAL specimens     only), is one component of a     comprehensive MRSA colonization     surveillance program. It is not     intended to diagnose MRSA     infection nor to guide or     monitor treatment for     MRSA infections.    Studies/Results: No results found.  Assessment: She is improving on therapy for methicillin-resistant coagulase-negative staph bacteremia..    Plan: 1. Continue vancomycin  2. PICC has not been placed yet  Cliffton Asters, MD [facility] [phone] pager   [phone] cell  01/18/2014, 9:34 AM

## 2014-01-18 NOTE — Procedures (Signed)
ELECTROENCEPHALOGRAM REPORT   Patient: Tammy BeachRosa L Sarratt       Room #: [id number] EEG No. ID: [id number] Age: 80 y.o.        Sex: female Referring Physician: Benjamine MolaVann Report Date:  01/18/2014        Interpreting Physician: Pola CornEYNOLDS,Linc Renne D  History: Tammy Fernandez is an 80 y.o. female with stroke and left hemiparesis  Medications:  Scheduled: .  stroke: mapping our early stages of recovery book   Does not apply Once  . aspirin  300 mg Rectal Daily   Or  . aspirin  325 mg Oral Daily  . feeding supplement (PRO-STAT SUGAR FREE 64)  30 mL Per Tube Daily  . levothyroxine  50 mcg Per Tube QAC breakfast  . metoprolol tartrate  25 mg Per Tube BID  . pantoprazole sodium  40 mg Per Tube Daily  . vancomycin  1,500 mg Intravenous Q24H    Conditions of Recording:  This is a 16 channel EEG carried out with the patient in the awake state.  Description:  The waking background activity consists of a low voltage, symmetrical, fairly well organized, 9 Hz alpha activity, seen from the parieto-occipital and posterior temporal regions.  Low voltage fast activity, poorly organized, is seen anteriorly and is at times superimposed on more posterior regions.  A mixture of theta and alpha rhythms are seen from the central and temporal regions. The patient does not drowse or sleep. Hyperventilation and intermittent photic stimulation were not performed.  IMPRESSION: This is a normal awake electroencephalogram.  No epileptiform activity is noted.    Comment:  An EEG with the patient sleep deprived to elicit drowse and light sleep may be desirable to further elicit a possible seizure disorder.     Thana FarrLeslie Hudson Lehmkuhl, MD [facility] [phone] 01/18/2014, 6:50 PM

## 2014-01-18 NOTE — Progress Notes (Signed)
    [facility] has been requested to perform a transesophageal echocardiogram on Ms. Cavan for 01/19/14 at 2pm. After careful review of history and examination, the risks and benefits of transesophageal echocardiogram have been explained including risks of esophageal damage, perforation (1:10,000 risk), bleeding, pharyngeal hematoma as well as other potential complications associated with conscious sedation including aspiration, arrhythmia, respiratory failure and death. Alternatives to treatment were discussed, questions were answered. Patient is willing to proceed.   Thereasa ParkinSTERN, Kasiyah Platter, PA-C 01/18/2014 2:58 PM

## 2014-01-18 NOTE — Progress Notes (Signed)
Speech Language Pathology Treatment: Dysphagia;Cognitive-Linquistic  Patient Details Name: Tammy Fernandez MRN: [id number] DOB: [birth year] Today's Date: 01/18/2014 Time: 1010-1029 SLP Time Calculation (min): 19 min  Assessment / Plan / Recommendation Clinical Impression  Pt significantly improved. She is fully alert, demonstrated awareness of situation and asking appropriate question. Language and word finding adequate with basic tasks.  Moderate verbal cues are still needed for sustained attention to more complex questions and verbal tasks. Pt is able to consume thin liquids and masticate a banana without difficulty. There was one instance of cough 1-2 minutes after intake. Pt felt she was choking. Suspect this will improve with removal of NG. Recommend Dys 3 (mech soft diet due to dentition with thin liquids and removal of NG. SLP will f/u for diet tolerance and for further cognitive therapy.    HPI HPI: 80 y.o. female with a history of hypertension and hyperlipidemia who developed acute onset of inability to speak, gaze to the right and paralysis of left extremities    Pertinent Vitals NA  SLP Plan  Continue with current plan of care    Recommendations Diet recommendations: Dysphagia 3 (mechanical soft);Thin liquid Liquids provided via: Cup;Straw Medication Administration: Whole meds with puree Supervision: Patient able to self feed;Full supervision/cueing for compensatory strategies Compensations: Slow rate;Small sips/bites Postural Changes and/or Swallow Maneuvers: Seated upright 90 degrees              General recommendations: Rehab consult Follow up Recommendations: Inpatient Rehab;24 hour supervision/assistance Plan: Continue with current plan of care    GO    [name], MA CCC-SLP [phone]  Tammy Fernandez, Tammy Fernandez Tammy Fernandez 01/18/2014, 10:33 AM

## 2014-01-18 NOTE — Progress Notes (Signed)
Patient and Son wants her Xanax reordered instead of getting Ativan.. Will update MD in am.

## 2014-01-18 NOTE — Progress Notes (Signed)
EEG completed; results pending.    

## 2014-01-18 NOTE — Progress Notes (Signed)
Occupational Therapy Treatment Patient Details Name: Tammy Fernandez MRN: [id number] DOB: [birth year] Today's Date: 01/18/2014 Time: 4098-1191 OT Time Calculation (min): 26 min  OT Assessment / Plan / Recommendation  History of present illness Tammy Fernandez is an 80 y.o. female with a history of hypertension and hyperlipidemia who developed acute onset of inability to speak, gaze to the right and paralysis of left extremities earlier today. Patient was last seen well at 9:30 this morning. She has no previous history of stroke nor TIA. She's been taking aspirin daily. CT scan of her head showed no acute intracranial abnormality. NIH stroke score was 22.   OT comments  Pt making progress with functional goals and should continue with acute OT services to address impairments to increase level of function and safety. Pt no longer NPO and was abe to feed self sitting EOB supported for balance. Pt's family was feeding her at be level upon entering room. Pt was no longer hungry once seated EOB but did participate in simulated task. Pt more alert with eyes open and able to visually attend; talkative, smiling. Pt's neck in laterally flexed position to R side with muscle tightness  Follow Up Recommendations  CIR;Supervision/Assistance - 24 hour    Barriers to Discharge       Equipment Recommendations  Other (comment) (TBD)    Recommendations for Other Services    Frequency Min 3X/week   Progress towards OT Goals Progress towards OT goals: Progressing toward goals  Plan Discharge plan remains appropriate    Precautions / Restrictions Precautions Precautions: Fall Restrictions Weight Bearing Restrictions: No   Pertinent Vitals/Pain No c/o pain    ADL  Eating/Feeding: Performed;Set up;Min guard Where Assessed - Eating/Feeding: Edge of bed Grooming: Performed;Set up;Min guard using L hand to assist with verbal and physical cues Where Assessed - Grooming: Supported sitting Upper Body Bathing:  Simulated;Maximal assistance, using L hand with verbal and physical cues to initiate Where Assessed - Upper Body Bathing: Supported sitting Lower Body Bathing: Maximal assistance;+1 Total assistance, using L hand with verbal and physical cues to initiate Where Assessed - Lower Body Bathing: Supported sitting Toilet Transfer: Simulated;+2 Total assistance Toilet Transfer Method: Sit to stand Toileting - Clothing Manipulation and Hygiene: +1 Total assistance Where Assessed - Toileting Clothing Manipulation and Hygiene: Standing Transfers/Ambulation Related to ADLs: cues for correct hand placement, sequencing ADL Comments: pt more alert with eyes open talkative. Cues for using L UE for assisting with ADLs pt attempting to use R UE when instructed to use L to assist. Pt no longer NPO and sat EOB to simulate self feeding    OT Diagnosis:    OT Problem List:   OT Treatment Interventions:     OT Goals(current goals can now be found in the care plan section)    Visit Information  Last OT Received On: 01/18/14 Assistance Needed: +2 PT/OT/SLP Co-Evaluation/Treatment: Yes Reason for Co-Treatment: For patient/therapist safety OT goals addressed during session: ADL's and self-care Reason Eval/Treat Not Completed: Patient at procedure or test/ unavailable History of Present Illness: Tammy Fernandez is an 80 y.o. female with a history of hypertension and hyperlipidemia who developed acute onset of inability to speak, gaze to the right and paralysis of left extremities earlier today. Patient was last seen well at 9:30 this morning. She has no previous history of stroke nor TIA. She's been taking aspirin daily. CT scan of her head showed no acute intracranial abnormality. NIH stroke score was 22.    Subjective  Data      Prior Functioning       Cognition  Cognition Arousal/Alertness: Awake/alert Behavior During Therapy: Flat affect Overall Cognitive Status: Impaired/Different from baseline Area of  Impairment: Orientation;Attention;Memory;Following commands;Safety/judgement;Awareness;Problem solving Orientation Level: Disoriented to;Place;Situation Current Attention Level: Sustained Memory: Decreased recall of precautions;Decreased short-term memory Following Commands: Follows one step commands inconsistently;Follows one step commands with increased time Safety/Judgement: Decreased awareness of deficits Problem Solving: Slow processing;Decreased initiation;Difficulty sequencing;Requires verbal cues;Requires tactile cues    Mobility  Bed Mobility Overal bed mobility: Needs Assistance;+2 for physical assistance Bed Mobility: Supine to Sit Rolling: Max assist;+2 for physical assistance Supine to sit: +2 for physical assistance;Max assist Transfers Overall transfer level: Needs assistance Equipment used: 2 person hand held assist Transfers: Sit to/from Stand Sit to Stand: +2 physical assistance;Mod assist General transfer comment: cues for correct hand placement, sequencing          Balance Balance Overall balance assessment: Needs assistance Sitting-balance support: Bilateral upper extremity supported;Feet supported Sitting balance-[name]: Fair Sitting balance - Comments: poor initially, improved after sitting EOB several minutes Postural control: Posterior lean;Right lateral lean Standing balance support: Bilateral upper extremity supported  End of Session OT - End of Session Equipment Utilized During Treatment: Gait belt Activity Tolerance: Patient tolerated treatment well Patient left: in chair;with call bell/phone within reach;with family/visitor present  GO     Galen ManilaSpencer, Evanthia Maund Jeanette 01/18/2014, 3:04 PM

## 2014-01-18 NOTE — Progress Notes (Signed)
Rehab admissions - Noted that pt has completed EEG this am. Spoke with pt and several family members to share update that we are still waiting on insurance approval from [employer]. Pt was brighter and more interactive this day compared to my last visit. Will keep pt/family and medical team updated on progression with insurance authorization. Please call me with any questions. Thanks.  Juliann MuleJanine Corona Popovich, PT Rehabilitation Admissions Coordinator [phone]

## 2014-01-18 NOTE — Progress Notes (Signed)
OT Cancellation Note  Patient Details Name: Tammy Fernandez MRN: [id number] DOB: 1924/10/15   Cancelled Treatment:    Reason Eval/Treat Not Completed: Patient at procedure or test/ unavailable. Pt off floor for EEG, will re attempt later today as time allows  Galen ManilaSpencer, Ezequiel Macauley Jeanette 01/18/2014, 11:50 AM

## 2014-01-18 NOTE — Progress Notes (Signed)
Stroke Team Progress Note  HISTORY Tammy Fernandez is an 80 y.o. female with a history of hypertension and hyperlipidemia who developed acute onset of inability to speak, gaze to the right and paralysis of left extremities earlier today 01/11/2014. Patient was last seen well at 9:30 a.m. on 01/11/2014. She has no previous history of stroke nor TIA. She's been taking aspirin daily. CT scan of her head showed no acute intracranial abnormality. NIH stroke score was 22.  tPA Given: No: Beyond time under for treatment consideration   SUBJECTIVE Family members at bedside. Patient is getting stuck for labs.  OBJECTIVE Most recent Vital Signs: Filed Vitals:   01/17/14 2019 01/18/14 0200 01/18/14 0434 01/18/14 1000  BP: 184/80 156/77 180/89 185/78  Pulse: 118 113 110 101  Temp: 98.4 F (36.9 C) 98.5 F (36.9 C) 99 F (37.2 C) 98.2 F (36.8 C)  TempSrc: Oral Oral Oral Oral  Resp: 18 20 19 20   Height:      Weight: 81.7 kg (180 lb 1.9 oz)     SpO2: 93% 96% 98% 98%   CBG (last 3)   Recent Labs  01/18/14 0004 01/18/14 0432 01/18/14 0730  GLUCAP 223* 230* 162*    IV Fluid Intake:   . feeding supplement (OSMOLITE 1.5 CAL) 1,000 mL (01/16/14 1201)    MEDICATIONS  .  stroke: mapping our early stages of recovery book   Does not apply Once  . aspirin  300 mg Rectal Daily   Or  . aspirin  325 mg Oral Daily  . feeding supplement (PRO-STAT SUGAR FREE 64)  30 mL Per Tube Daily  . furosemide  20 mg Intravenous Daily  . levothyroxine  50 mcg Per Tube QAC breakfast  . metoprolol tartrate  25 mg Per Tube BID  . pantoprazole sodium  40 mg Per Tube Daily  . vancomycin  1,500 mg Intravenous Q24H   PRN:  acetaminophen, acetaminophen, hydrALAZINE, LORazepam, senna-docusate, zolpidem  Diet:  Dysphagia 3 thin liquids Activity:  Up with assistance DVT Prophylaxis:  SCDs  CLINICALLY SIGNIFICANT STUDIES Basic Metabolic Panel:  Recent Labs Lab 01/13/14 0223  01/15/14 0442 01/16/14 0437  01/18/14 1036  NA 125*  < > 130* 134* 144  K 4.4  < > 3.9 3.6* 3.4*  CL 82*  < > 86* 88* 90*  CO2 29  < > 35* 37* >45*  GLUCOSE 174*  < > 157* 175* 225*  BUN 27*  < > 21 27* 25*  CREATININE 0.93  < > 0.64 0.71 0.62  CALCIUM 9.4  < > 9.8 10.2 11.3*  MG  --   --  2.0  --   --   PHOS 3.3  --  2.4  --   --   < > = values in this interval not displayed. Liver Function Tests:   Recent Labs Lab 01/11/14 1544 01/13/14 0223  AST 22  --   ALT 11  --   ALKPHOS 89  --   BILITOT 0.5  --   PROT 8.3  --   ALBUMIN 3.2* 3.0*   CBC:  Recent Labs Lab 01/11/14 1544  01/16/14 0437 01/18/14 1036  WBC 11.9*  < > 14.0* 16.9*  NEUTROABS 8.8*  --   --   --   HGB 15.0  < > 12.5 13.8  HCT 41.5  < > 38.5 43.0  MCV 84.7  < > 92.1 94.9  PLT 321  < > 281 325  < > = values in  this interval not displayed. Coagulation:   Recent Labs Lab 01/11/14 1544  LABPROT 13.1  INR 1.01   Cardiac Enzymes:   Recent Labs Lab 01/11/14 1544  TROPONINI <0.30   Urinalysis:   Recent Labs Lab 01/11/14 1951  COLORURINE YELLOW  LABSPEC 1.017  PHURINE 6.5  GLUCOSEU NEGATIVE  HGBUR LARGE*  BILIRUBINUR NEGATIVE  KETONESUR 15*  PROTEINUR 30*  UROBILINOGEN 0.2  NITRITE NEGATIVE  LEUKOCYTESUR MODERATE*   Lipid Panel    Component Value Date/Time   CHOL 165 01/12/2014 0606   TRIG 79 01/12/2014 0606   HDL 38* 01/12/2014 0606   CHOLHDL 4.3 01/12/2014 0606   VLDL 16 01/12/2014 0606   LDLCALC 111* 01/12/2014 0606   HgbA1C  Lab Results  Component Value Date   HGBA1C 6.6* 01/12/2014    Urine Drug Screen:     Component Value Date/Time   LABOPIA NONE DETECTED 01/11/2014 1951   COCAINSCRNUR NONE DETECTED 01/11/2014 1951   LABBENZ POSITIVE* 01/11/2014 1951   AMPHETMU NONE DETECTED 01/11/2014 1951   THCU NONE DETECTED 01/11/2014 1951   LABBARB NONE DETECTED 01/11/2014 1951    Alcohol Level:   Recent Labs Lab 01/11/14 1544  ETH <11    CT of the brain 01/11/2014 No intracranial hemorrhage. Question tiny  infarct superior left lenticular nucleus ofquestionable age. No CT evidence of large acute infarct. No intracranial mass lesion noted on this unenhanced exam.   Mr Brain W/Wo Contrast  01/14/2014   1. Small acute lacunar infarct in the right parietal lobe (posterior right MCA territory). No mass effect or hemorrhage. 2. Regressed abnormal signal in the right temporal lobe, and no diffusion changes or postcontrast enhancement. However, I consider this is real gyral abnormality, favored to be seizure sequelae in this setting. 3. No other new intracranial abnormality.      2D Echocardiogram  ejection fraction 55-60%. No cardiac source of embolism identified  Carotid Doppler    EKG  sinus tachycardia rate 107 beats per minute For complete results please see formal report.   EEG  01/18/2014  01/12/2014 Frontal slowing  Therapy Recommendations inpatient rehabilitation  Physical Exam   Neurologic Exam:  Mental Status: she is awake and alert. She speaks in simple sentences and follow commands but is not completely oriented. She knows she is in [place] at the hospital but is not oriented to the name of the hospital or year. Alert, follows simple commands, able to to reach out and grab my hand with right arm and then touch her nose. Will repeat what I asked her to do but unable to tell me where she is, date or year.  Cranial Nerves:  II: blinks to threat bilaterally, pupils equal, round, reactive to light and accommodation  III,IV, VI: ptosis not present, extra-ocular motions intact bilaterally but has a left gaze preference  V,VII: facial droop , facial light touch sensation normal bilaterally  VIII: hearing normal bilaterally  IX,X: gag reflex present  XI: not tested  XII: midline tongue  Motor:  She actually has good strength throughout in both the upper and lower extremities bilaterally. Strength is graded as 4+/5 throughout. Sensory: Pinprick and light touch intact throughout, bilaterally   Deep Tendon Reflexes:  Right: Upper Extremity Left: Upper extremity  biceps (C-5 to C-6) 2/4 biceps (C-5 to C-6) 2/4  tricep (C7) 2/4 triceps (C7) 2/4  Brachioradialis (C6) 2/4 Brachioradialis (C6) 2/4  Lower Extremity Lower Extremity  quadriceps (L-2 to L-4) 1/4 quadriceps (L-2 to L-4) 1/4  Achilles (S1)  0/4 Achilles (S1) 0/4  Plantars:  Mute bilaterally   ASSESSMENT Tammy Fernandez is a 80 y.o. female was found with with mutism and left hemiparesis. Given history and MRI findings Of a small acute lacunar infarct in the right parietal lobe - patient is thought to have had a small stroke which then triggered a seizure. The profound weakness/hemiplegia, speech arrest and confusion most likely was the result of a seizure (MRI findings are resolving). Repeat EEG is pending. Given stroke and seizure together in the setting of bacteremia, suspicious for endocarditis. Recommend TEE to evaluate.  Infarct less likely felt to be thrombotic secondary to small vessel disease, but will await TEE results.  On no antithrombotics prior to admission. Now on aspirin 325 mg orally every day for secondary stroke prevention. Patient with resultant left hemiparesis (resolved), confusion a (improved) and dysphagia (resolved). Work up underway.    Hypertension  Hyperlipidemia cholesterol 165 LDL 111 not on statin prior to admission; not on statin now  Bacteremia due to staphylococcus  Leukocytosis  Urinary tract infection - Vancomycin   A1c 6.6  Hospital day # 7  TREATMENT/PLAN  Continue aspirin 325 mg orally every day for secondary stroke prevention. Given acute stroke and seizure in the setting of bacteremia, have scheduled a TEE to look for embolic source with [facility] for tomorrow (if endo is open, have been told they close during snow) - suspicious for endocardidits. If positive for PFO (patent foramen ovale), check bilateral lower extremity venous dopplers to rule  out DVT as possible source of stroke.  F/u  carotid Dopplers pending statin recommended. Unable to tolerate crestor in the past. Will discuss further with her tomorrow.  Rehab following for possible discharge there.   Annie Main, MSN, RN, ANVP-BC, ANP-BC, [facility] Pager: [phone] 01/18/2014 12:38 PM  I have personally obtained a history, examined the patient, evaluated imaging results, and formulated the assessment and plan of care. I agree with the above. Delia Heady, MD

## 2014-01-18 NOTE — Progress Notes (Signed)
CRITICAL VALUE ALERT  Critical value received:  CO2 > 45  Date of notification:  01/18/2014  Time of notification:  1145 am  Critical value read back: yes  Nurse who received alert:  Barnett ApplebaumValencia B. Kitrina Maurin, RN BC, BSN, MSN  MD notified (1st page):  Dr. Benjamine MolaVann  Time of first page:  1155 am  MD notified (2nd page):  Time of second page:  Responding MD:  Dr. Benjamine MolaVann  Time MD responded:  1158 am

## 2014-01-18 NOTE — Progress Notes (Signed)
Progress Note    Tammy Fernandez:[id number] DOB: [birth year] DOA: 01/11/2014 PCP: [name], MD  Brief narrative: 80 y.o. female presenting on 01/11/2014 with a past medical history of Hyperlipidemia; Hypertension; Anxiety; GERD (gastroesophageal reflux disease); Abnormal heart rhythms; Fatty liver; and Internal hemorrhoid who was noted by family to be nonverbal, looking to the right and not moving her left side at 2:30 PM after last being seen normal at 10 AM. CT head in ER was negative. She was also noted to have a low sodium and a UTI.   Subjective: Awake, no new c/o  Assessment/Plan:  Left side weakness/encephalopathy/smal Lacunar infarct R parietal lobe - Initial MRI equivocal but FU positive for small CVA -incidentally signal change right temporal lobe ?? Seizure related -TTE negative for vegetation but ID rec treat presumptively and does not recommend TEE -Neuro treating as possible meningitis-?? Dc Rocephin and Acyclovir -ID does not favor pursuit of LP since would not change tx outcome -PT/OT eval- CIR consulted  Sepsis due to E Coli UTI and Methacillin resistant coag-negative staph bacteremia -ID recomends Vanc and PICC which was attempted but not able to be placed by PICC team- will need IR to perform procedure -recent issues with persistent sinusitis prior to admission could be source -essentially pan sensitive e coli (resistant to amp)  Acute dysphagia -cont TF - titrated up to goal rate 45/hr- tolerating well -suspect as infections are effectively treated and with cont'd FU per SLP may eventually pass swallow eval   Acute respiratory failure with hypoxia -seems r/t hypoventilation and AMS -tol O2 wean -no focal infiltrate on CXR    Hypertension - cont IV Metoprolol-dose increased  2/11    Hyponatremia - appears to have a chronic component upon discussion with family - seemed dehydrated and sodium slowly improved with fluids - appreciate nephro  consult- serum cortisol normal - Nephro feels now euvolemic and initiated fluid restriction 2/11- started Lasix 2/12 and as of 2/13 rec cont same plan    Hyperkalemia - resolved    Hematuria - likely due to UTI    Low TSH -  Free T4 slightly elevated so suspect sick euthyroid syndrome - cont usual dose Levothyroxine  -repeat TSH 8-12 weeks post dc    Code Status: Full code Family Communication: with daughter at bedside  Disposition Plan: CIR once passes swallow or PEG placed  Consultants: neuro  Procedures: TTE 2/10 Left ventricle: The cavity size was normal. There was mild concentric hypertrophy. Systolic function was normal. The estimated ejection fraction was in the range of 55% to 60%. Wall motion was normal; there were no regional wall motion abnormalities. Doppler parameters are consistent with abnormal left ventricular relaxation (grade 1 diastolic dysfunction). - No cardiac source of embolism was identified, but cannot be ruled out on the basis of this examination. 1. Consider transesophageal echocardiography if clinically indicated. 2. Consider transesophageal echocardiography if clinically indicated in order to exclude intracardiac thrombus.    Antibiotics: Vancomycin 2/10 >>> Ceftriaxone 2/9 >>>2/11 Acyclovir 2/11 >>>2/11 Ancef- 2/11- 2/13  DVT prophylaxis: SCDs  Objective: Filed Weights   01/16/14 0800 01/16/14 2224 01/17/14 2019  Weight: 86 kg (189 lb 9.5 oz) 86.5 kg (190 lb 11.2 oz) 81.7 kg (180 lb 1.9 oz)   Blood pressure 180/89, pulse 110, temperature 99 F (37.2 C), temperature source Oral, resp. rate 19, height 5\' 2"  (1.575 m), weight 81.7 kg (180 lb 1.9 oz), SpO2 98.00%.  Intake/Output Summary (Last 24 hours) at 01/18/14 0815 Last data  filed at 01/18/14 0700  Gross per 24 hour  Intake    420 ml  Output   2100 ml  Net  -1680 ml     Exam: General: No acute respiratory distress-alert Lungs: Clear to auscultation bilaterally without  wheezes or crackles-  Cardiovascular: Regular rate and rhythm without murmur gallop or rub normal S1 and S2 Abdomen: Nontender, nondistended, soft, bowel sounds positive, no rebound, no ascites, no appreciable mass Extremities: No significant cyanosis, clubbing, or edema bilateral lower extremities Neuro: left sided neglect, left facial droop- moving left side much better now- 4/5 strength  Data Reviewed: Basic Metabolic Panel:  Recent Labs Lab 01/12/14 1726 01/13/14 0223 01/14/14 0239 01/15/14 0442 01/16/14 0437  NA 123* 125* 127* 130* 134*  K 4.4 4.4 4.2 3.9 3.6*  CL 82* 82* 86* 86* 88*  CO2 28 29 26  35* 37*  GLUCOSE 159* 174* 130* 157* 175*  BUN 23 27* 24* 21 27*  CREATININE 0.96 0.93 0.66 0.64 0.71  CALCIUM 9.5 9.4 9.4 9.8 10.2  MG  --   --   --  2.0  --   PHOS  --  3.3  --  2.4  --    Liver Function Tests:  Recent Labs Lab 01/11/14 1544 01/13/14 0223  AST 22  --   ALT 11  --   ALKPHOS 89  --   BILITOT 0.5  --   PROT 8.3  --   ALBUMIN 3.2* 3.0*   No results found for this basename: LIPASE, AMYLASE,  in the last 168 hours No results found for this basename: AMMONIA,  in the last 168 hours  CBC:  Recent Labs Lab 01/11/14 1544 01/11/14 1709 01/14/14 0239 01/15/14 0845 01/16/14 0437  WBC 11.9*  --  24.9* 17.6* 14.0*  NEUTROABS 8.8*  --   --   --   --   HGB 15.0 17.3* 12.2 11.9* 12.5  HCT 41.5 51.0* 35.7* 35.5* 38.5  MCV 84.7  --  87.5 89.2 92.1  PLT 321  --  290 257 281   Cardiac Enzymes:  Recent Labs Lab 01/11/14 1544  TROPONINI <0.30   BNP (last 3 results) No results found for this basename: PROBNP,  in the last 8760 hours CBG:  Recent Labs Lab 01/17/14 1701 01/17/14 2014 01/18/14 0004 01/18/14 0432 01/18/14 0730  GLUCAP 187* 224* 223* 230* 162*    Recent Results (from the past 240 hour(s))  URINE CULTURE     Status: None   Collection Time    01/11/14  7:51 PM      Result Value Ref Range Status   Specimen Description URINE,  CATHETERIZED   Final   Special Requests NONE   Final   Culture  Setup Time     Final   Value: 01/11/2014 21:34     [facility]   Colony Count     Final   Value: >=100,000 COLONIES/ML     Performed at [facility]   Culture     Final   Value: ESCHERICHIA COLI     Performed at [facility]   Report Status 01/13/2014 FINAL   Final   Organism ID, Bacteria ESCHERICHIA COLI   Final  CULTURE, BLOOD (ROUTINE X 2)     Status: None   Collection Time    01/12/14  6:00 AM      Result Value Ref Range Status   Specimen Description BLOOD RIGHT HAND   Final   Special  Requests BOTTLES DRAWN AEROBIC ONLY 5CC   Final   Culture  Setup Time     Final   Value: 01/12/2014 10:21     Performed at [facility]   Culture     Final   Value: STAPHYLOCOCCUS SPECIES (COAGULASE NEGATIVE)     Note: SUSCEPTIBILITIES PERFORMED ON PREVIOUS CULTURE WITHIN THE LAST 5 DAYS.     Note: Gram Stain Report Called to,Read Back By and Verified With: Gray Bernhardt [id number] BRMEL     Performed at [facility]   Report Status 01/15/2014 FINAL   Final  CULTURE, BLOOD (ROUTINE X 2)     Status: None   Collection Time    01/12/14  6:06 AM      Result Value Ref Range Status   Specimen Description BLOOD RIGHT ANTECUBITAL   Final   Special Requests BOTTLES DRAWN AEROBIC ONLY 3CC   Final   Culture  Setup Time     Final   Value: 01/12/2014 10:21     Performed at [facility]   Culture     Final   Value: STAPHYLOCOCCUS SPECIES (COAGULASE NEGATIVE)     Note: RIFAMPIN AND GENTAMICIN SHOULD NOT BE USED AS SINGLE DRUGS FOR TREATMENT OF STAPH INFECTIONS.     Note: Gram Stain Report Called to,Read Back By and Verified With: Kerby Moors [id number] BRMEL     Performed at [facility]   Report Status 01/15/2014 FINAL   Final   Organism ID, Bacteria STAPHYLOCOCCUS SPECIES (COAGULASE NEGATIVE)   Final  MRSA PCR SCREENING     Status: None   Collection Time     01/12/14  6:35 AM      Result Value Ref Range Status   MRSA by PCR NEGATIVE  NEGATIVE Final   Comment:            The GeneXpert MRSA Assay (FDA     approved for NASAL specimens     only), is one component of a     comprehensive MRSA colonization     surveillance program. It is not     intended to diagnose MRSA     infection nor to guide or     monitor treatment for     MRSA infections.     Studies:  Recent x-ray studies have been reviewed in detail by the Attending Physician  Scheduled Meds:  Scheduled Meds: .  stroke: mapping our early stages of recovery book   Does not apply Once  . aspirin  300 mg Rectal Daily   Or  . aspirin  325 mg Oral Daily  . feeding supplement (PRO-STAT SUGAR FREE 64)  30 mL Per Tube Daily  . furosemide  20 mg Intravenous Daily  . levothyroxine  50 mcg Per Tube QAC breakfast  . metoprolol tartrate  25 mg Per Tube BID  . pantoprazole sodium  40 mg Per Tube Daily  . vancomycin  1,500 mg Intravenous Q24H    Time spent on care of this patient: 35 min   Chalmers Cater [phone]  01/18/2014, 8:15 AM

## 2014-01-18 NOTE — Progress Notes (Signed)
CRITICAL VALUE ALERT  Critical value received:  PH-7.49, PCO2-65, PO2-55 and [place]- 49 Date of notification:  01/18/14  Time of notification:  1305 pm  Critical value read back: yes  Nurse who received alert:  Barnett ApplebaumValencia B. Serafin [place], RN BC, BSN, MSN  MD notified (1st page):  Dr, Benjamine MolaVann  Time of first page:  1307  pm  MD notified (2nd page):  Time of second page:  Responding MD:  Dr. Benjamine MolaVann  Time MD responded:  1311pm

## 2014-01-19 ENCOUNTER — Inpatient Hospital Stay (HOSPITAL_COMMUNITY)
Admission: RE | Admit: 2014-01-19 | Discharge: 2014-01-29 | DRG: 945 | Disposition: A | Discharge status: Home Health | Payer: PRIVATE HEALTH INSURANCE | Source: Intra-hospital | Attending: Physical Medicine & Rehabilitation | Admitting: Physical Medicine & Rehabilitation

## 2014-01-19 ENCOUNTER — Encounter (HOSPITAL_COMMUNITY)
Admission: EM | Disposition: A | Discharge status: Rehabilitation Facility | Payer: Self-pay | Source: Home / Self Care | Attending: Internal Medicine

## 2014-01-19 ENCOUNTER — Encounter (HOSPITAL_COMMUNITY): Payer: Self-pay | Admitting: *Deleted

## 2014-01-19 ENCOUNTER — Inpatient Hospital Stay (HOSPITAL_COMMUNITY): Payer: PRIVATE HEALTH INSURANCE

## 2014-01-19 DIAGNOSIS — Z8249 Family history of ischemic heart disease and other diseases of the circulatory system: Secondary | ICD-10-CM

## 2014-01-19 DIAGNOSIS — A4102 Sepsis due to Methicillin resistant Staphylococcus aureus: Secondary | ICD-10-CM | POA: Diagnosis present

## 2014-01-19 DIAGNOSIS — E871 Hypo-osmolality and hyponatremia: Secondary | ICD-10-CM

## 2014-01-19 DIAGNOSIS — Z823 Family history of stroke: Secondary | ICD-10-CM | POA: Diagnosis not present

## 2014-01-19 DIAGNOSIS — Z8673 Personal history of transient ischemic attack (TIA), and cerebral infarction without residual deficits: Secondary | ICD-10-CM | POA: Diagnosis present

## 2014-01-19 DIAGNOSIS — E039 Hypothyroidism, unspecified: Secondary | ICD-10-CM | POA: Diagnosis present

## 2014-01-19 DIAGNOSIS — K219 Gastro-esophageal reflux disease without esophagitis: Secondary | ICD-10-CM | POA: Diagnosis present

## 2014-01-19 DIAGNOSIS — Q211 Atrial septal defect: Secondary | ICD-10-CM

## 2014-01-19 DIAGNOSIS — G4720 Circadian rhythm sleep disorder, unspecified type: Secondary | ICD-10-CM | POA: Diagnosis not present

## 2014-01-19 DIAGNOSIS — R1310 Dysphagia, unspecified: Secondary | ICD-10-CM | POA: Diagnosis present

## 2014-01-19 DIAGNOSIS — Z888 Allergy status to other drugs, medicaments and biological substances status: Secondary | ICD-10-CM

## 2014-01-19 DIAGNOSIS — R7881 Bacteremia: Secondary | ICD-10-CM

## 2014-01-19 DIAGNOSIS — I9589 Other hypotension: Secondary | ICD-10-CM | POA: Diagnosis present

## 2014-01-19 DIAGNOSIS — Z882 Allergy status to sulfonamides status: Secondary | ICD-10-CM | POA: Diagnosis not present

## 2014-01-19 DIAGNOSIS — Z79899 Other long term (current) drug therapy: Secondary | ICD-10-CM | POA: Diagnosis not present

## 2014-01-19 DIAGNOSIS — F411 Generalized anxiety disorder: Secondary | ICD-10-CM | POA: Diagnosis present

## 2014-01-19 DIAGNOSIS — R110 Nausea: Secondary | ICD-10-CM | POA: Diagnosis not present

## 2014-01-19 DIAGNOSIS — Z800 Family history of malignant neoplasm of digestive organs: Secondary | ICD-10-CM

## 2014-01-19 DIAGNOSIS — I6350 Cerebral infarction due to unspecified occlusion or stenosis of unspecified cerebral artery: Secondary | ICD-10-CM | POA: Diagnosis present

## 2014-01-19 DIAGNOSIS — G9341 Metabolic encephalopathy: Secondary | ICD-10-CM

## 2014-01-19 DIAGNOSIS — F329 Major depressive disorder, single episode, unspecified: Secondary | ICD-10-CM | POA: Diagnosis not present

## 2014-01-19 DIAGNOSIS — A419 Sepsis, unspecified organism: Secondary | ICD-10-CM | POA: Diagnosis present

## 2014-01-19 DIAGNOSIS — K089 Disorder of teeth and supporting structures, unspecified: Secondary | ICD-10-CM | POA: Diagnosis not present

## 2014-01-19 DIAGNOSIS — Z7982 Long term (current) use of aspirin: Secondary | ICD-10-CM | POA: Diagnosis not present

## 2014-01-19 DIAGNOSIS — Z833 Family history of diabetes mellitus: Secondary | ICD-10-CM

## 2014-01-19 DIAGNOSIS — I639 Cerebral infarction, unspecified: Secondary | ICD-10-CM

## 2014-01-19 DIAGNOSIS — T465X5A Adverse effect of other antihypertensive drugs, initial encounter: Secondary | ICD-10-CM | POA: Diagnosis present

## 2014-01-19 DIAGNOSIS — A4902 Methicillin resistant Staphylococcus aureus infection, unspecified site: Secondary | ICD-10-CM

## 2014-01-19 DIAGNOSIS — I6789 Other cerebrovascular disease: Secondary | ICD-10-CM

## 2014-01-19 DIAGNOSIS — I253 Aneurysm of heart: Secondary | ICD-10-CM | POA: Diagnosis present

## 2014-01-19 DIAGNOSIS — Z5189 Encounter for other specified aftercare: Secondary | ICD-10-CM | POA: Diagnosis present

## 2014-01-19 DIAGNOSIS — E785 Hyperlipidemia, unspecified: Secondary | ICD-10-CM | POA: Diagnosis present

## 2014-01-19 DIAGNOSIS — Q2111 Secundum atrial septal defect: Secondary | ICD-10-CM | POA: Diagnosis not present

## 2014-01-19 DIAGNOSIS — K7689 Other specified diseases of liver: Secondary | ICD-10-CM | POA: Diagnosis present

## 2014-01-19 DIAGNOSIS — F3289 Other specified depressive episodes: Secondary | ICD-10-CM | POA: Diagnosis not present

## 2014-01-19 DIAGNOSIS — I10 Essential (primary) hypertension: Secondary | ICD-10-CM | POA: Diagnosis present

## 2014-01-19 DIAGNOSIS — I69991 Dysphagia following unspecified cerebrovascular disease: Secondary | ICD-10-CM

## 2014-01-19 HISTORY — PX: TEE WITHOUT CARDIOVERSION: SHX5443

## 2014-01-19 LAB — BASIC METABOLIC PANEL
BUN: 27 mg/dL — ABNORMAL HIGH (ref 6–23)
CALCIUM: 10.4 mg/dL (ref 8.4–10.5)
CO2: 44 mEq/L (ref 19–32)
Chloride: 88 mEq/L — ABNORMAL LOW (ref 96–112)
Creatinine, Ser: 0.63 mg/dL (ref 0.50–1.10)
GFR, EST NON AFRICAN AMERICAN: 78 mL/min — AB (ref >90)
GLUCOSE: 107 mg/dL — AB (ref 70–99)
Potassium: 3.7 mEq/L (ref 3.7–5.3)
SODIUM: 139 meq/L (ref 137–147)

## 2014-01-19 LAB — GLUCOSE, CAPILLARY
Glucose-Capillary: 105 mg/dL — ABNORMAL HIGH (ref 70–99)
Glucose-Capillary: 119 mg/dL — ABNORMAL HIGH (ref 70–99)
Glucose-Capillary: 145 mg/dL — ABNORMAL HIGH (ref 70–99)

## 2014-01-19 LAB — CBC
HCT: 38.7 % (ref 36.0–46.0)
Hemoglobin: 12.6 g/dL (ref 12.0–15.0)
MCH: 30.4 pg (ref 26.0–34.0)
MCHC: 32.6 g/dL (ref 30.0–36.0)
MCV: 93.3 fL (ref 78.0–100.0)
PLATELETS: ADEQUATE 10*3/uL (ref 150–400)
RBC: 4.15 MIL/uL (ref 3.87–5.11)
RDW: 14.1 % (ref 11.5–15.5)
WBC: 14.4 10*3/uL — ABNORMAL HIGH (ref 4.0–10.5)

## 2014-01-19 SURGERY — ECHOCARDIOGRAM, TRANSESOPHAGEAL
Anesthesia: Moderate Sedation

## 2014-01-19 MED ORDER — ACETAMINOPHEN 650 MG RE SUPP: 650.0000 mg | RECTAL | Status: DC | PRN

## 2014-01-19 MED ORDER — FENTANYL CITRATE 0.05 MG/ML IJ SOLN
INTRAMUSCULAR | Status: AC
  Filled 2014-01-19: qty 2

## 2014-01-19 MED ORDER — ASPIRIN 300 MG RE SUPP
300.0000 mg | Freq: Every day | RECTAL | Status: DC
  Administered 2014-01-26: 300 mg via RECTAL
  Filled 2014-01-19 (×11): qty 1

## 2014-01-19 MED ORDER — ONDANSETRON HCL 4 MG/2ML IJ SOLN: 4.0000 mg | Freq: Four times a day (QID) | INTRAMUSCULAR | Status: DC | PRN

## 2014-01-19 MED ORDER — PANTOPRAZOLE SODIUM 40 MG PO PACK: 40.0000 mg | PACK | Freq: Every day | ORAL | Status: DC

## 2014-01-19 MED ORDER — ASPIRIN 325 MG PO TABS
325.0000 mg | ORAL_TABLET | Freq: Every day | ORAL | Status: DC
Start: 2014-01-20 — End: 2014-01-29
  Administered 2014-01-20 – 2014-01-29 (×8): 325 mg via ORAL
  Filled 2014-01-19 (×11): qty 1

## 2014-01-19 MED ORDER — FENTANYL CITRATE 0.05 MG/ML IJ SOLN
INTRAMUSCULAR | Status: DC | PRN
  Administered 2014-01-19: 25 ug via INTRAVENOUS

## 2014-01-19 MED ORDER — ALPRAZOLAM 0.5 MG PO TABS: 0.5000 mg | ORAL_TABLET | Freq: Two times a day (BID) | ORAL | Status: DC

## 2014-01-19 MED ORDER — ALPRAZOLAM 0.5 MG PO TABS: 0.5000 mg | ORAL_TABLET | Freq: Every evening | ORAL | Status: DC | PRN

## 2014-01-19 MED ORDER — ASPIRIN 325 MG PO TABS: 325.0000 mg | ORAL_TABLET | Freq: Every day | ORAL | Status: DC

## 2014-01-19 MED ORDER — OSMOLITE 1.5 CAL PO LIQD: 1000.0000 mL | ORAL | Status: DC

## 2014-01-19 MED ORDER — PANTOPRAZOLE SODIUM 40 MG PO TBEC
40.0000 mg | DELAYED_RELEASE_TABLET | Freq: Every day | ORAL | Status: DC
  Administered 2014-01-19 – 2014-01-29 (×11): 40 mg via ORAL
  Filled 2014-01-19 (×10): qty 1

## 2014-01-19 MED ORDER — SORBITOL 70 % SOLN
30.0000 mL | Freq: Every day | Status: DC | PRN
  Administered 2014-01-20 – 2014-01-22 (×2): 30 mL via ORAL
  Filled 2014-01-19 (×2): qty 30

## 2014-01-19 MED ORDER — MIDAZOLAM HCL 5 MG/ML IJ SOLN
INTRAMUSCULAR | Status: AC
  Filled 2014-01-19: qty 2

## 2014-01-19 MED ORDER — ALPRAZOLAM 0.5 MG PO TABS
0.5000 mg | ORAL_TABLET | Freq: Two times a day (BID) | ORAL | Status: DC
  Administered 2014-01-19: 0.5 mg via ORAL
  Filled 2014-01-19: qty 1

## 2014-01-19 MED ORDER — ALPRAZOLAM 0.5 MG PO TABS
0.5000 mg | ORAL_TABLET | Freq: Two times a day (BID) | ORAL | Status: DC
  Administered 2014-01-19 – 2014-01-20 (×3): 0.5 mg via ORAL
  Administered 2014-01-21: 0.25 mg via ORAL
  Administered 2014-01-21: 0.5 mg via ORAL
  Filled 2014-01-19 (×5): qty 1

## 2014-01-19 MED ORDER — VANCOMYCIN HCL 10 G IV SOLR
1500.0000 mg | INTRAVENOUS | Status: DC
  Administered 2014-01-20 – 2014-01-21 (×2): 1500 mg via INTRAVENOUS
  Filled 2014-01-19 (×2): qty 1500

## 2014-01-19 MED ORDER — METOPROLOL TARTRATE 25 MG/10 ML ORAL SUSPENSION: 25.0000 mg | Freq: Two times a day (BID) | ORAL | Status: DC

## 2014-01-19 MED ORDER — SENNOSIDES-DOCUSATE SODIUM 8.6-50 MG PO TABS
1.0000 | ORAL_TABLET | Freq: Every evening | ORAL | Status: DC | PRN
  Administered 2014-01-22: 1 via ORAL
  Filled 2014-01-19: qty 1

## 2014-01-19 MED ORDER — ACETAMINOPHEN 325 MG PO TABS
650.0000 mg | ORAL_TABLET | ORAL | Status: DC | PRN
  Administered 2014-01-20 – 2014-01-29 (×24): 650 mg via ORAL
  Filled 2014-01-19 (×27): qty 2

## 2014-01-19 MED ORDER — VANCOMYCIN HCL 10 G IV SOLR: 1500.0000 mg | INTRAVENOUS | Status: DC

## 2014-01-19 MED ORDER — LEVOTHYROXINE SODIUM 50 MCG PO TABS
50.0000 ug | ORAL_TABLET | Freq: Every day | ORAL | Status: DC
  Administered 2014-01-20 – 2014-01-29 (×10): 50 ug
  Filled 2014-01-19 (×12): qty 1

## 2014-01-19 MED ORDER — PRO-STAT SUGAR FREE PO LIQD: 30.0000 mL | Freq: Every day | ORAL | Status: DC

## 2014-01-19 MED ORDER — ONDANSETRON HCL 4 MG PO TABS: 4.0000 mg | ORAL_TABLET | Freq: Four times a day (QID) | ORAL | Status: DC | PRN

## 2014-01-19 MED ORDER — MIDAZOLAM HCL 10 MG/2ML IJ SOLN
INTRAMUSCULAR | Status: DC | PRN
  Administered 2014-01-19 (×2): 1 mg via INTRAVENOUS

## 2014-01-19 MED ORDER — METOPROLOL TARTRATE 25 MG PO TABS
25.0000 mg | ORAL_TABLET | Freq: Two times a day (BID) | ORAL | Status: DC
  Administered 2014-01-19 – 2014-01-23 (×8): 25 mg via ORAL
  Filled 2014-01-19 (×10): qty 1

## 2014-01-19 MED ORDER — BUTAMBEN-TETRACAINE-BENZOCAINE 2-2-14 % EX AERO
INHALATION_SPRAY | CUTANEOUS | Status: DC | PRN
  Administered 2014-01-19: 2 via TOPICAL

## 2014-01-19 NOTE — Procedures (Signed)
RUE PICC 37 cm SVC RA

## 2014-01-19 NOTE — Progress Notes (Signed)
Patient discharged to CIR, room 20. Family at bedside during discharge. Medications were sent with patient along with belongings. Report was called to Doree FudgeLuz, RN prior to discharge.  Patient was stable upon discharge.

## 2014-01-19 NOTE — Progress Notes (Signed)
CRITICAL VALUE ALERT  Critical value received:  Results for Hollie BeachGODBOLT, Tammy Fernandez (MRN [id number]) as of 01/19/2014 06:20  Ref. Range 01/19/2014 03:35  CO2 Latest Range: 19-32 mEq/Fernandez 46 (HH)    Date of notification:    Time of notification:    Critical value read back:yes  Nurse who received alert:  Carmin Muskratastro, Nanci Lakatos Arzadon   MD notified (1st page):  NP Craige CottaKirby  Time of first page:  6:21 AM   MD notified (2nd page):  Time of second page:  Responding MD:  Donnamarie PoagK Kirby, NP  Time MD responded:  6:27 AM

## 2014-01-19 NOTE — H&P (Signed)
Physical Medicine and Rehabilitation Admission H&P    Chief Complaint  Patient presents with  . Code Stroke  :  Chief complaint; weakness  HPI: Tammy Fernandez is a 80 y.o. female history hypertension. Patient independent and active prior to admission. Admitted 01/11/2014 with altered mental status staring to the right not moving her left side per family report. Noted sodium level 115 and potassium 5.7. MRI of the brain 01/11/2014 showed no convincing evidence to suggest acute ischemia, however there was mild bright signal within the right temporal cortex. Echocardiogram with ejection fraction 58% grade 1 diastolic dysfunction without embolus. EEG was negative for seizure. Carotid Dopplers with no ICA stenosis. Placed on aspirin for CVA prophylaxis. A repeat MRI of the brain 01/14/2014 showed small acute lacunar infarct in the right parietal lobe(posterior right MCA territory) advised to continue aspirin. Patient placed on intravenous fluids of normal saline for hyponatremia. Renal service followup for hyponatremia question element of SIADH and workup ongoing. Urine culture greater than 100,000 gram-negative rods placed on Rocephin. Blood cultures x2 on admission of methicillin resistant coagulase-negative staph bacteremia with infectious disease consult to Dr. Megan Salon and Rocephin was changed to intravenous vancomycin x6 weeks. TEE completed 01/19/2014 showing ejection fraction 70% no evidence of endocarditis without thrombus . There was an atrial septal aneurysm with PFO. Venous Doppler studies lower extremities negative. Physical therapy evaluation completed 01/12/2014 with recommendations of physical medicine rehabilitation consult to consider inpatient rehabilitation services   ROS Review of Systems  Gastrointestinal:  GERD  Musculoskeletal: Positive for myalgias.  Neurological: Positive for dizziness.  Psychiatric/Behavioral:  Anxiety  All other systems reviewed and are  negative  Past Medical History  Diagnosis Date  . Hyperlipidemia   . Hypertension   . Anxiety   . GERD (gastroesophageal reflux disease)   . Abnormal heart rhythms   . Fatty liver   . Internal hemorrhoid    Past Surgical History  Procedure Laterality Date  . Cesarean section    . Abdominal hysterectomy     Family History  Problem Relation Age of Onset  . Stroke Father   . Hypertension Father   . Colon cancer Cousin   . Colon cancer      Aunt  . Diabetes      aunt and cousins  . Heart disease      cousins   Social History:  reports that she has never smoked. She has never used smokeless tobacco. She reports that she does not drink alcohol or use illicit drugs. Allergies:  Allergies  Allergen Reactions  . Crestor [Rosuvastatin Calcium]   . Sulfa Drugs Cross Reactors Rash   Medications Prior to Admission  Medication Sig Dispense Refill  . ALPRAZolam (XANAX) 0.5 MG tablet Take 0.5 mg by mouth at bedtime as needed for anxiety.      . Ascorbic Acid (VITAMIN C PO) Take 1 tablet by mouth daily.      Marland Kitchen aspirin EC 81 MG tablet Take 81 mg by mouth daily.      . Calcium & Magnesium Carbonates (MYLANTA PO) Take 5 mLs by mouth daily as needed (constipation).      . calcium carbonate (TUMS - DOSED IN MG ELEMENTAL CALCIUM) 500 MG chewable tablet Chew 1 tablet by mouth daily as needed for indigestion or heartburn.      . Cimetidine (TAGAMET PO) Take 1 capsule by mouth daily.      Marland Kitchen HAWTHORN BERRIES PO Take 1 tablet by mouth daily.      Marland Kitchen  levothyroxine (SYNTHROID, LEVOTHROID) 50 MCG tablet Take 50 mcg by mouth daily.       Marland Kitchen lisinopril (PRINIVIL,ZESTRIL) 10 MG tablet Take 5 mg by mouth daily.      . meclizine (ANTIVERT) 12.5 MG tablet Take 12.5 mg by mouth 3 (three) times daily as needed for nausea.      Marland Kitchen OVER THE COUNTER MEDICATION Place 1 drop into both eyes daily as needed (dry eyes). Eye drops      . promethazine (PHENERGAN) 25 MG tablet Take 25-50 mg by mouth 2 (two) times daily  as needed for nausea.      . propranolol ER (INDERAL LA) 60 MG 24 hr capsule Take 60 mg by mouth daily.      . Red Yeast Rice Extract (RED YEAST RICE PO) Take 1 tablet by mouth daily.      . Simethicone (GAS-X PO) Take 1 tablet by mouth daily as needed (gas).      . [DISCONTINUED] Simethicone (GAS-X PO) Take by mouth as needed.        Home: Home Living Family/patient expects to be discharged to:: Inpatient rehab Living Arrangements: Children Available Help at Discharge: Family Additional Comments: dtr and son live with her  Lives With: Family   Functional History: Prior Function Comments: family assisted with meal prep, as able to feed self  Functional Status:  Mobility: mod-max assist     Ambulation/Gait Ambulation Distance (Feet): 2 Feet General Gait Details: took 4-5 steps forward before turning to back to chair with assist for lateral weight shifts and mon cues for taking steps    ADL: ADL Eating/Feeding: Performed;Set up;Min guard Where Assessed - Eating/Feeding: Edge of bed Grooming: Performed;Set up;Min guard Where Assessed - Grooming: Supported sitting Upper Body Bathing: Simulated;Maximal assistance Where Assessed - Upper Body Bathing: Supported sitting Lower Body Bathing: Maximal assistance;+1 Total assistance Where Assessed - Lower Body Bathing: Supported sitting Upper Body Dressing: Simulated;+1 Total assistance Where Assessed - Upper Body Dressing: Supported sitting Lower Body Dressing: Simulated;+2 Total assistance Where Assessed - Lower Body Dressing: Supported sit to Lobbyist: Haematologist Method: Sit to Loss adjuster, chartered: Bedside commode Transfers/Ambulation Related to ADLs: cues for correct hand placement, sequencing ADL Comments: pt more alert with eyes open talkative. Cues for using L UE for assisting with ADLs pt attempting to use R UE when instructed to use L to assist. Pt no longer NPO and  sat EOB to simulate self feeding  Cognition: Cognition Overall Cognitive Status: Impaired/Different from baseline Arousal/Alertness: Awake/alert Orientation Level: Oriented to person;Oriented to place;Disoriented to time;Disoriented to situation Attention: Focused Focused Attention: Impaired Focused Attention Impairment: Verbal basic;Functional basic Memory: Impaired Memory Impairment: Storage deficit;Retrieval deficit Awareness: Impaired Awareness Impairment: Intellectual impairment Problem Solving: Impaired Problem Solving Impairment: Verbal basic;Functional basic Behaviors: Perseveration Safety/Judgment: Impaired Cognition Arousal/Alertness: Awake/alert Behavior During Therapy: Flat affect Overall Cognitive Status: Impaired/Different from baseline Area of Impairment: Orientation;Attention;Memory;Following commands;Safety/judgement;Awareness;Problem solving Orientation Level: Disoriented to;Place;Situation Current Attention Level: Sustained Memory: Decreased recall of precautions;Decreased short-term memory Following Commands: Follows one step commands inconsistently;Follows one step commands with increased time Safety/Judgement: Decreased awareness of deficits Awareness: Intellectual Problem Solving: Slow processing;Decreased initiation;Difficulty sequencing;Requires verbal cues;Requires tactile cues General Comments: pt able to answer more questions consistently; answers were appropriate ~50% of the time; pt was able to orient to year and self today      Blood pressure 182/72, pulse 79, temperature 98.6 F (37 C), temperature source Oral, resp. rate 16, height $RemoveBe'5\' 2"'[id number]$  (  1.575 m), weight 83.099 kg (183 lb 3.2 oz), SpO2 92.00%. Physical Exam Constitutional:  Morbidly obese, NAD Eyes:  Right gaze preference Neck: Normal range of motion. Neck supple. No thyromegaly present.  Cardiovascular: Normal rate and regular rhythm. No auscultated murmur Respiratory: Effort normal and  breath sounds normal. No respiratory distress. No wheezes rales or rhonchi GI: Soft. Bowel sounds are normal. She exhibits no distension.  Neurological:  Patient is more alert. Speech more intelligible. Improved basic insight and awareness.  Followed all basic commands. RUE 4+ deltoid, bicep, tricep, hand, LUE 3+ deltoid, 4-bicep, tricep, HI. RLE 3+ HF to 4+ at ankle. LLE is 3 to 3+ HF to 4/5 at then ankle with ADF and APF. Sensed PP on left side. Could not discern right to left difference in general LT or Pain sense. DTR's 1+.  Skin: Skin is warm and dry  Results for orders placed during the hospital encounter of 01/11/14 (from the past 48 hour(s))  GLUCOSE, CAPILLARY     Status: Abnormal   Collection Time    01/17/14 11:56 AM      Result Value Ref Range   Glucose-Capillary 183 (*) 70 - 99 mg/dL  GLUCOSE, CAPILLARY     Status: Abnormal   Collection Time    01/17/14  5:01 PM      Result Value Ref Range   Glucose-Capillary 187 (*) 70 - 99 mg/dL  GLUCOSE, CAPILLARY     Status: Abnormal   Collection Time    01/17/14  8:14 PM      Result Value Ref Range   Glucose-Capillary 224 (*) 70 - 99 mg/dL  GLUCOSE, CAPILLARY     Status: Abnormal   Collection Time    01/18/14 12:04 AM      Result Value Ref Range   Glucose-Capillary 223 (*) 70 - 99 mg/dL  GLUCOSE, CAPILLARY     Status: Abnormal   Collection Time    01/18/14  4:32 AM      Result Value Ref Range   Glucose-Capillary 230 (*) 70 - 99 mg/dL  GLUCOSE, CAPILLARY     Status: Abnormal   Collection Time    01/18/14  7:30 AM      Result Value Ref Range   Glucose-Capillary 162 (*) 70 - 99 mg/dL  CBC     Status: Abnormal   Collection Time    01/18/14 10:36 AM      Result Value Ref Range   WBC 16.9 (*) 4.0 - 10.5 K/uL   RBC 4.53  3.87 - 5.11 MIL/uL   Hemoglobin 13.8  12.0 - 15.0 g/dL   HCT 43.0  36.0 - 46.0 %   MCV 94.9  78.0 - 100.0 fL   MCH 30.5  26.0 - 34.0 pg   MCHC 32.1  30.0 - 36.0 g/dL   RDW 14.2  11.5 - 15.5 %   Platelets  325  150 - 400 K/uL  BASIC METABOLIC PANEL     Status: Abnormal   Collection Time    01/18/14 10:36 AM      Result Value Ref Range   Sodium 144  137 - 147 mEq/L   Potassium 3.4 (*) 3.7 - 5.3 mEq/L   Chloride 90 (*) 96 - 112 mEq/L   CO2 >45 (*) 19 - 32 mEq/L   Comment: CRITICAL RESULT CALLED TO, READ BACK BY AND VERIFIED WITH:     MCKNIGHT M RN 01/18/14 1151 COSTELLO B     REPEATED TO VERIFY   Glucose,  Bld 225 (*) 70 - 99 mg/dL   BUN 25 (*) 6 - 23 mg/dL   Creatinine, Ser 0.62  0.50 - 1.10 mg/dL   Calcium 11.3 (*) 8.4 - 10.5 mg/dL   GFR calc non Af Amer 79 (*) >90 mL/min   GFR calc Af Amer >90  >90 mL/min   Comment: (NOTE)     The eGFR has been calculated using the CKD EPI equation.     This calculation has not been validated in all clinical situations.     eGFR's persistently <90 mL/min signify possible Chronic Kidney     Disease.  CULTURE, BLOOD (ROUTINE X 2)     Status: None   Collection Time    01/18/14 12:20 PM      Result Value Ref Range   Specimen Description BLOOD RIGHT HAND     Special Requests BOTTLES DRAWN AEROBIC ONLY 5CC     Culture  Setup Time       Value: 01/18/2014 16:16     Performed at [facility]   Culture       Value:        BLOOD CULTURE RECEIVED NO GROWTH TO DATE CULTURE WILL BE HELD FOR 5 DAYS BEFORE ISSUING A FINAL NEGATIVE REPORT     Performed at [facility]   Report Status PENDING    BLOOD GAS, ARTERIAL     Status: Abnormal   Collection Time    01/18/14 12:50 PM      Result Value Ref Range   FIO2 0.21     pH, Arterial 7.487 (*) 7.350 - 7.450   pCO2 arterial 65.0 (*) 35.0 - 45.0 mmHg   Comment: CRITICAL RESULT CALLED TO, READ BACK BY AND VERIFIED WITH:     L.MCKNIGHT,RN @ 1259 BY D.ALLEN,RRT,RCP ON 01/18/14   pO2, Arterial 54.7 (*) 80.0 - 100.0 mmHg   Bicarbonate 48.7 (*) 20.0 - 24.0 mEq/L   TCO2 50.7  0 - 100 mmol/L   Acid-Base Excess 23.0 (*) 0.0 - 2.0 mmol/L   O2 Saturation 89.9     Patient temperature 98.6     Collection  site RIGHT RADIAL     Drawn by [id number]     Allens test (pass/fail) PASS  PASS  GLUCOSE, CAPILLARY     Status: Abnormal   Collection Time    01/18/14  3:59 PM      Result Value Ref Range   Glucose-Capillary 146 (*) 70 - 99 mg/dL  GLUCOSE, CAPILLARY     Status: Abnormal   Collection Time    01/18/14  8:09 PM      Result Value Ref Range   Glucose-Capillary 122 (*) 70 - 99 mg/dL  GLUCOSE, CAPILLARY     Status: Abnormal   Collection Time    01/18/14 11:57 PM      Result Value Ref Range   Glucose-Capillary 145 (*) 70 - 99 mg/dL  CBC     Status: Abnormal   Collection Time    01/19/14  3:35 AM      Result Value Ref Range   WBC 14.4 (*) 4.0 - 10.5 K/uL   Comment: WHITE COUNT CONFIRMED ON SMEAR   RBC 4.15  3.87 - 5.11 MIL/uL   Hemoglobin 12.6  12.0 - 15.0 g/dL   HCT 38.7  36.0 - 46.0 %   MCV 93.3  78.0 - 100.0 fL   MCH 30.4  26.0 - 34.0 pg   MCHC 32.6  30.0 - 36.0 g/dL  RDW 14.1  11.5 - 15.5 %   Platelets    150 - 400 K/uL   Value: PLATELET CLUMPS NOTED ON SMEAR, COUNT APPEARS ADEQUATE  BASIC METABOLIC PANEL     Status: Abnormal   Collection Time    01/19/14  3:35 AM      Result Value Ref Range   Sodium 139  137 - 147 mEq/L   Potassium 3.7  3.7 - 5.3 mEq/L   Chloride 88 (*) 96 - 112 mEq/L   CO2 44 (*) 19 - 32 mEq/L   Comment: CRITICAL RESULT CALLED TO, READ BACK BY AND VERIFIED WITH:     CASTRO,E RN 01/19/2014 0542 JORDANS     REPEATED TO VERIFY   Glucose, Bld 107 (*) 70 - 99 mg/dL   BUN 27 (*) 6 - 23 mg/dL   Creatinine, Ser 0.63  0.50 - 1.10 mg/dL   Calcium 10.4  8.4 - 10.5 mg/dL   GFR calc non Af Amer 78 (*) >90 mL/min   GFR calc Af Amer >90  >90 mL/min   Comment: (NOTE)     The eGFR has been calculated using the CKD EPI equation.     This calculation has not been validated in all clinical situations.     eGFR's persistently <90 mL/min signify possible Chronic Kidney     Disease.  GLUCOSE, CAPILLARY     Status: Abnormal   Collection Time    01/19/14  4:32 AM       Result Value Ref Range   Glucose-Capillary 119 (*) 70 - 99 mg/dL  GLUCOSE, CAPILLARY     Status: Abnormal   Collection Time    01/19/14  7:54 AM      Result Value Ref Range   Glucose-Capillary 105 (*) 70 - 99 mg/dL   No results found.  Post Admission Physician Evaluation: 1. Functional deficits secondary  to small right parietal infarct/ metabolic encephalopathy. 2. Patient is admitted to receive collaborative, interdisciplinary care between the physiatrist, rehab nursing staff, and therapy team. 3. Patient's level of medical complexity and substantial therapy needs in context of that medical necessity cannot be provided at a lesser intensity of care such as a SNF. 4. Patient has experienced substantial functional loss from his/her baseline which was documented above under the "Functional History" and "Functional Status" headings.  Judging by the patient's diagnosis, physical exam, and functional history, the patient has potential for functional progress which will result in measurable gains while on inpatient rehab.  These gains will be of substantial and practical use upon discharge  in facilitating mobility and self-care at the household level. 5. Physiatrist will provide 24 hour management of medical needs as well as oversight of the therapy plan/treatment and provide guidance as appropriate regarding the interaction of the two. 6. 24 hour rehab nursing will assist with bladder management, bowel management, safety, skin/wound care, disease management, medication administration, pain management and patient education  and help integrate therapy concepts, techniques,education, etc. 7. PT will assess and treat for/with: Lower extremity strength, range of motion, stamina, balance, functional mobility, safety, adaptive techniques and equipment, NMR, visual spatial and cognitive perceptual awareness.   Goals are: supervision to min assist. 8. OT will assess and treat for/with: ADL's, functional  mobility, safety, upper extremity strength, adaptive techniques and equipment, NMR, cognitive perceptual and visual spatial awareness.   Goals are: supervision to min assist. 9. SLP will assess and treat for/with: cognition, communication.  Goals are: supervision. 10. Case Management and Education officer, museum  will assess and treat for psychological issues and discharge planning. 11. Team conference will be held weekly to assess progress toward goals and to determine barriers to discharge. 12. Patient will receive at least 3 hours of therapy per day at least 5 days per week. 13. ELOS: 16-20 days       14. Prognosis:  excellent   Medical Problem List and Plan: 1. Small acute lacunar infarct/sepsis/metabolic encephalopathy 2. DVT Prophylaxis/Anticoagulation: SCDs. Venous Dopplers negative 3. Pain Management: Tylenol as needed. 4. Mood/anxiety. Xanax 0.5 mg each bedtime as needed. Provide emotional support 5. Neuropsych: This patient is not capable of making decisions on her own behalf. 6. ID. Methicillin resistant coagulase-negative staph bacteremia. Continue IV vancomycin x6 weeks 7. Dysphagia. Dysphagia 3 thin liquids. Followup speech therapy 8.Hyponatremia. Resolved with latest sodium 139/Follow up labs 9. Hypertension. Lopressor 25 mg twice a day. Monitor with increased mobility 10. Hypothyroidism. Synthroid. Latest TSH is 0.187  Meredith Staggers, MD, [facility]  01/19/2014

## 2014-01-19 NOTE — Progress Notes (Signed)
ANTIBIOTIC CONSULT NOTE - INITIAL  Pharmacy Consult for Vancomycin Indication: Bacteremia  Allergies  Allergen Reactions  . Crestor [Rosuvastatin Calcium]   . Sulfa Drugs Cross Reactors Rash    Patient Measurements:   Adjusted Body Weight:    Vital Signs: Temp: 98.8 F (37.1 C) (02/17 1615) Temp src: Oral (02/17 1036) BP: 128/50 mmHg (02/17 1615) Pulse Rate: 102 (02/17 1615) Intake/Output from previous day:   Intake/Output from this shift:    Labs:  Recent Labs  01/18/14 1036 01/19/14 0335  WBC 16.9* 14.4*  HGB 13.8 12.6  PLT 325 PLATELET CLUMPS NOTED ON SMEAR, COUNT APPEARS ADEQUATE  CREATININE 0.62 0.63   The CrCl is unknown because both a height and weight (above a minimum accepted value) are required for this calculation. No results found for this basename: VANCOTROUGH, Leodis BinetVANCOPEAK, VANCORANDOM, GENTTROUGH, GENTPEAK, GENTRANDOM, TOBRATROUGH, TOBRAPEAK, TOBRARND, AMIKACINPEAK, AMIKACINTROU, AMIKACIN,  in the last 72 hours   Microbiology: Recent Results (from the past 720 hour(s))  URINE CULTURE     Status: None   Collection Time    01/11/14  7:51 PM      Result Value Ref Range Status   Specimen Description URINE, CATHETERIZED   Final   Special Requests NONE   Final   Culture  Setup Time     Final   Value: 01/11/2014 21:34     Performed [facility]   Colony Count     Final   Value: >=100,000 COLONIES/ML     Performed at [facility]   Culture     Final   Value: ESCHERICHIA COLI     Performed at [facility]   Report Status 01/13/2014 FINAL   Final   Organism ID, Bacteria ESCHERICHIA COLI   Final  CULTURE, BLOOD (ROUTINE X 2)     Status: None   Collection Time    01/12/14  6:00 AM      Result Value Ref Range Status   Specimen Description BLOOD RIGHT HAND   Final   Special Requests BOTTLES DRAWN AEROBIC ONLY 5CC   Final   Culture  Setup Time     Final   Value: 01/12/2014 10:21     Performed at [facility]   Culture      Final   Value: STAPHYLOCOCCUS SPECIES (COAGULASE NEGATIVE)     Note: SUSCEPTIBILITIES PERFORMED ON PREVIOUS CULTURE WITHIN THE LAST 5 DAYS.     Note: Gram Stain Report Called to,Read Back By and Verified With: Gray BernhardtENNE BROWN [id number] BRMEL     Performed at [facility]   Report Status 01/15/2014 FINAL   Final  CULTURE, BLOOD (ROUTINE X 2)     Status: None   Collection Time    01/12/14  6:06 AM      Result Value Ref Range Status   Specimen Description BLOOD RIGHT ANTECUBITAL   Final   Special Requests BOTTLES DRAWN AEROBIC ONLY 3CC   Final   Culture  Setup Time     Final   Value: 01/12/2014 10:21     Performed at [facility]   Culture     Final   Value: STAPHYLOCOCCUS SPECIES (COAGULASE NEGATIVE)     Note: RIFAMPIN AND GENTAMICIN SHOULD NOT BE USED AS SINGLE DRUGS FOR TREATMENT OF STAPH INFECTIONS.     Note: Gram Stain Report Called to,Read Back By and Verified With: Kerby MoorsENEE BROWN [id number] BRMEL     Performed at [facility]   Report  Status 01/15/2014 FINAL   Final   Organism ID, Bacteria STAPHYLOCOCCUS SPECIES (COAGULASE NEGATIVE)   Final  MRSA PCR SCREENING     Status: None   Collection Time    01/12/14  6:35 AM      Result Value Ref Range Status   MRSA by PCR NEGATIVE  NEGATIVE Final   Comment:            The GeneXpert MRSA Assay (FDA     approved for NASAL specimens     only), is one component of a     comprehensive MRSA colonization     surveillance program. It is not     intended to diagnose MRSA     infection nor to guide or     monitor treatment for     MRSA infections.  CULTURE, BLOOD (ROUTINE X 2)     Status: None   Collection Time    01/18/14 12:20 PM      Result Value Ref Range Status   Specimen Description BLOOD RIGHT HAND   Final   Special Requests BOTTLES DRAWN AEROBIC ONLY 5CC   Final   Culture  Setup Time     Final   Value: 01/18/2014 16:16     Performed at [facility]   Culture     Final   Value:        BLOOD  CULTURE RECEIVED NO GROWTH TO DATE CULTURE WILL BE HELD FOR 5 DAYS BEFORE ISSUING A FINAL NEGATIVE REPORT     Performed at [facility]   Report Status PENDING   Incomplete    Medical History: Past Medical History  Diagnosis Date  . Hyperlipidemia   . Hypertension   . Anxiety   . GERD (gastroesophageal reflux disease)   . Abnormal heart rhythms   . Fatty liver   . Internal hemorrhoid      Assessment: 80 y/o F presented 2/9 with s/sx of CVA, Na 115, and K=5.7. MRI not indicative of CVA, however MRI on 2/12 showed small acute lacunar infarct in the right parietal lobe. Patient also had GNR in Urine cx and was placed on Rocephin and BC x 2 on admission grew methicillin resistant CNS x 2. TEE completed 01/19/2014 showing ejection fraction 70%, no evidence of endocarditis without thrombus . There was an atrial septal aneurysm with PFO. ID: cannot rule out the possibility of endocarditis and embolized a small vegetation to her brain causing her initial stroke syndrome. Plan Vanco x 4 weeks.  Vanco 2/12>> Vanco trough 2/14: 10.3. Dose adjusted up.   Goal of Therapy:  Vancomycin trough level 15-20 mcg/ml  Plan:  Continue Vancomycin 1500mg  IV q24h Next trough due by the end of the week.  Misty Stanley Stillinger 01/19/2014,7:40 PM

## 2014-01-19 NOTE — Progress Notes (Signed)
Patient ID: Tammy Fernandez, female   DOB: 1923-12-08, 80 y.o.   MRN: [id number] Patient ID: Tammy Fernandez, female   DOB: 1924/11/10, 80 y.o.   MRN: [id number]         [facility] for Infectious Disease    Date of Admission:  01/11/2014   Total days of antibiotics 9         Principal Problem:   Bacteremia due to Staphylococcus Active Problems:   Left hemiplegia   Hypertension   Hyponatremia   Hyperkalemia   Hematuria   E. coli UTI (urinary tract infection)   Acute encephalopathy   Acute respiratory failure with hypoxia   Dysphagia, post-stroke   .  stroke: mapping our early stages of recovery book   Does not apply Once  . ALPRAZolam  0.5 mg Oral BID  . aspirin  300 mg Rectal Daily   Or  . aspirin  325 mg Oral Daily  . feeding supplement (PRO-STAT SUGAR FREE 64)  30 mL Per Tube Daily  . levothyroxine  50 mcg Per Tube QAC breakfast  . metoprolol tartrate  25 mg Per Tube BID  . pantoprazole sodium  40 mg Per Tube Daily  . vancomycin  1,500 mg Intravenous Q24H    Objective: Temp:  [97.8 F (36.6 C)-98.9 F (37.2 C)] 98.9 F (37.2 C) (02/17 1141) Pulse Rate:  [79-110] 110 (02/17 1141) Resp:  [16-21] 18 (02/17 1141) BP: (136-202)/(34-99) 136/57 mmHg (02/17 1141) SpO2:  [85 %-100 %] 97 % (02/17 1141) Weight:  [83.099 kg (183 lb 3.2 oz)] 83.099 kg (183 lb 3.2 oz) (02/16 2015)  TEE: No vegetations noted  Lab Results Lab Results  Component Value Date   WBC 14.4* 01/19/2014   HGB 12.6 01/19/2014   HCT 38.7 01/19/2014   MCV 93.3 01/19/2014   PLT PLATELET CLUMPS NOTED ON SMEAR, COUNT APPEARS ADEQUATE 01/19/2014    Lab Results  Component Value Date   CREATININE 0.63 01/19/2014   BUN 27* 01/19/2014   NA 139 01/19/2014   K 3.7 01/19/2014   CL 88* 01/19/2014   CO2 44* 01/19/2014    Lab Results  Component Value Date   ALT 11 01/11/2014   AST 22 01/11/2014   ALKPHOS 89 01/11/2014   BILITOT 0.5 01/11/2014      Microbiology: Recent Results (from the past 240 hour(s))  URINE  CULTURE     Status: None   Collection Time    01/11/14  7:51 PM      Result Value Ref Range Status   Specimen Description URINE, CATHETERIZED   Final   Special Requests NONE   Final   Culture  Setup Time     Final   Value: 01/11/2014 21:34     Performed at [facility] Count     Final   Value: >=100,000 COLONIES/ML     Performed at [facility]   Culture     Final   Value: ESCHERICHIA COLI     Performed at [facility]   Report Status 01/13/2014 FINAL   Final   Organism ID, Bacteria ESCHERICHIA COLI   Final  CULTURE, BLOOD (ROUTINE X 2)     Status: None   Collection Time    01/12/14  6:00 AM      Result Value Ref Range Status   Specimen Description BLOOD RIGHT HAND   Final   Special Requests BOTTLES DRAWN AEROBIC ONLY 5CC   Final   Culture  Setup Time     Final   Value: 01/12/2014 10:21     [facility]formed at [facility]   Culture     Final   Value: STAPHYLOCOCCUS SPECIES (COAGULASE NEGATIVE)     Note: SUSCEPTIBILITIES PERFORMED ON PREVIOUS CULTURE WITHIN THE LAST 5 DAYS.     Note: Gram Stain Report Called to,Read Back By and Verified With: Gray BernhardtENNE BROWN 0113A [id number] BRMEL     Performed at [facility]   Report Status 01/15/2014 FINAL   Final  CULTURE, BLOOD (ROUTINE X 2)     Status: None   Collection Time    01/12/14  6:06 AM      Result Value Ref Range Status   Specimen Description BLOOD RIGHT ANTECUBITAL   Final   Special Requests BOTTLES DRAWN AEROBIC ONLY 3CC   Final   Culture  Setup Time     Final   Value: 01/12/2014 10:21     Performed at [facility]   Culture     Final   Value: STAPHYLOCOCCUS SPECIES (COAGULASE NEGATIVE)     Note: RIFAMPIN AND GENTAMICIN SHOULD NOT BE USED AS SINGLE DRUGS FOR TREATMENT OF STAPH INFECTIONS.     Note: Gram Stain Report Called to,Read Back By and Verified With: Kerby MoorsENEE BROWN [id number] BRMEL     [facility]formed at [facility]   Report Status 01/15/2014 FINAL   Final    Organism ID, Bacteria STAPHYLOCOCCUS SPECIES (COAGULASE NEGATIVE)   Final  MRSA PCR SCREENING     Status: None   Collection Time    01/12/14  6:35 AM      Result Value Ref Range Status   MRSA by PCR NEGATIVE  NEGATIVE Final   Comment:            The GeneXpert MRSA Assay (FDA     approved for NASAL specimens     only), is one component of a     comprehensive MRSA colonization     surveillance program. It is not     intended to diagnose MRSA     infection nor to guide or     monitor treatment for     MRSA infections.  CULTURE, BLOOD (ROUTINE X 2)     Status: None   Collection Time    01/18/14 12:20 PM      Result Value Ref Range Status   Specimen Description BLOOD RIGHT HAND   Final   Special Requests BOTTLES DRAWN AEROBIC ONLY 5CC   Final   Culture  Setup Time     Final   Value: 01/18/2014 16:16     Performed at [facility]   Culture     Final   Value:        BLOOD CULTURE RECEIVED NO GROWTH TO DATE CULTURE WILL BE HELD FOR 5 DAYS BEFORE ISSUING A FINAL NEGATIVE REPORT     Performed at [facility]   Report Status PENDING   Incomplete    Studies/Results: Ir Fluoro Guide Cv Line Right  01/19/2014   CLINICAL DATA:  Sepsis  EXAM: Right upper extremity PICC LINE PLACEMENT WITH ULTRASOUND AND FLUOROSCOPIC GUIDANCE  FLUOROSCOPY TIME:  6 seconds.  PROCEDURE: The patient was advised of the possible risks andcomplications and agreed to undergo the procedure. The patient was then brought to the angiographic suite for the procedure.  The right arm was prepped with chlorhexidine, drapedin the usual sterile fashion using maximum barrier technique (cap and mask, sterile gown,  sterile gloves, large sterile sheet, hand hygiene and cutaneous antisepsis) and infiltrated locally with 1% Lidocaine.  Ultrasound demonstrated patency of the right cephalic vein, and this was documented with an image. Under real-time ultrasound guidance, this vein was accessed with a 21 gauge micropuncture  needle and image documentation was performed. A 0.018 wire was introduced in to the vein. Over this, a 5 [place] double lumen Power PICC, trimmed to 37 cm, was advanced to the lower SVC/right atrial junction. Fluoroscopy during the procedure and fluoro spot radiograph confirms appropriate catheter position. The catheter was flushed and covered with asterile dressing.  Complications: None  IMPRESSION: Successful right arm Power PICC line placement with ultrasound and fluoroscopic guidance. The catheter is ready for use.   Electronically Signed   By: Maryclare Bean M.D.   On: 01/19/2014 14:28   Ir US Guide Vasc Access Right  01/19/2014   CLINICAL DATA:  Sepsis  EXAM: Right upper extremity PICC LINE PLACEMENT WITH ULTRASOUND AND FLUOROSCOPIC GUIDANCE  FLUOROSCOPY TIME:  6 seconds.  PROCEDURE: The patient was advised of the possible risks andcomplications and agreed to undergo the procedure. The patient was then brought to the angiographic suite for the procedure.  The right arm was prepped with chlorhexidine, drapedin the usual sterile fashion using maximum barrier technique (cap and mask, sterile gown, sterile gloves, large sterile sheet, hand hygiene and cutaneous antisepsis) and infiltrated locally with 1% Lidocaine.  Ultrasound demonstrated patency of the right cephalic vein, and this was documented with an image. Under real-time ultrasound guidance, this vein was accessed with a 21 gauge micropuncture needle and image documentation was performed. A 0.018 wire was introduced in to the vein. Over this, a 5 [place] double lumen Power PICC, trimmed to 37 cm, was advanced to the lower SVC/right atrial junction. Fluoroscopy during the procedure and fluoro spot radiograph confirms appropriate catheter position. The catheter was flushed and covered with asterile dressing.  Complications: None  IMPRESSION: Successful right arm Power PICC line placement with ultrasound and fluoroscopic guidance. The catheter is ready for use.    Electronically Signed   By: Maryclare Bean M.D.   On: 01/19/2014 14:28    Assessment: She is improving on therapy for methicillin-resistant coagulase-negative staph bacteremia. Even though no vegetation or specific valvular abnormalities were noted on TEE I cannot rule out the possibility of it she did have coagulase-negative staph endocarditis and embolized a small vegetation to her brain causing her initial stroke syndrome. I still favor her 4 weeks of IV vancomycin.   Plan: 1. Continue vancomycin through 02/07/2014 2. I will sign off now but please call if I can be of further assistance  Cliffton Asters, MD [facility] Infectious Disease [facility] [phone] pager   [phone] cell 01/19/2014, 3:42 PM

## 2014-01-19 NOTE — PMR Pre-admission (Signed)
PMR Admission Coordinator Pre-Admission Assessment  Patient: Tammy Fernandez is an 80 y.o., female MRN: [id number] DOB: August 31, 1924 Height: $RemoveBefo'5\' 2"'InDaXdAZAej$  (157.5 cm) Weight: 83.099 kg (183 lb 3.2 oz)              Insurance Information HMO:     PPO:      PCP:      IPA:      80/20:      OTHER:  PRIMARY: [employer] Policy#: [id number]       Subscriber: self CM Name: Geryl Rankins      Phone#: [phone]     Fax#:  Pre-Cert#: [id number]      Note: follow up will be with onsite reviewer Geryl Rankins Employer: retired Benefits:  Phone #: [phone]     Name: Ulice Bold. Date: 12-03-13     Deduct: $147 (none met)      Out of Pocket Max: [phone] (none met)     Life Max: none CIR: $1260 copay ($315 copay for days 61-90)      SNF: $0 for days 1-20; $157.50 for days 21-100 (100 day visit max) Outpatient: 80%     Co-Pay: 20% (no copay, no visit limits) Home Health: 100%      Co-Pay: none, no visit limits DME: 80%     Co-Pay: 20%  Providers: in network  SECONDARY: Medicaid [place] Access      Policy#: [id number]      Subscriber: self Received approval from PCP: [facility] [phone] on 01-15-14 from [place].  NPI: [id number]  Emergency Contact Information Contact Information   Name Relation Home Work Mobile   Cooper,[name] Daughter [phone] [place] Daughter [phone]     Jeanmarie, Mccowen   [phone]   Davis,Carolyn Daughter   [phone]     Current Medical History  Patient Admitting Diagnosis: Left side weakness/encephalopathy/small Lacunar infarct R parietal lobe (posterior R MCA territory)  History of Present Illness: Tammy Fernandez is a 80 y.o. female history hypertension. Patient independent and active prior to admission. Admitted 01/11/2014 with altered mental status staring to the right not moving her left side per family report. Noted sodium level 115 and potassium 5.7. MRI of the brain showed no convincing evidence to suggest  acute ischemia with areas of patchy abnormality involving cortical as well as basal ganglia regions significant bleeding unclear. Echocardiogram and EEG pending. Carotid Dopplers with no ICA stenosis. Patient placed on intravenous fluids of normal saline for hyponatremia. Renal service followup for hyponatremia question element of SIADH and workup ongoing. Urine culture greater than 100,000 gram-negative rods placed on Rocephin. Physical therapy evaluation completed 01/12/2014 with recommendations of physical medicine rehabilitation consult to consider inpatient rehabilitation services.  NIH Total: 20  Past Medical History  Past Medical History  Diagnosis Date  . Hyperlipidemia   . Hypertension   . Anxiety   . GERD (gastroesophageal reflux disease)   . Abnormal heart rhythms   . Fatty liver   . Internal hemorrhoid     Family History  family history includes Colon cancer in her cousin and another family member; Diabetes in an other family member; Heart disease in an other family member; Hypertension in her father; Stroke in her father.  Prior Rehab/Hospitalizations: no previous rehab   Current Medications  Current facility-administered medications: stroke: mapping our early stages of recovery book, , Does not apply, Once, Debbe Odea, MD;  0.9 %  sodium chloride infusion, , Intravenous, Continuous, Perry Mount, PA-C;  acetaminophen (  TYLENOL) suppository 650 mg, 650 mg, Rectal, Q4H PRN, Toy Baker, MD, 650 mg at 01/13/14 0030;  acetaminophen (TYLENOL) tablet 650 mg, 650 mg, Oral, Q4H PRN, Toy Baker, MD, 650 mg at 01/19/14 1145 ALPRAZolam (XANAX) tablet 0.5 mg, 0.5 mg, Oral, BID, Jessica U Vann, DO, 0.5 mg at 01/19/14 1145;  aspirin suppository 300 mg, 300 mg, Rectal, Daily, Toy Baker, MD, 300 mg at 01/15/14 1059;  aspirin tablet 325 mg, 325 mg, Oral, Daily, Toy Baker, MD, 325 mg at 01/18/14 0946 feeding supplement (OSMOLITE 1.5 CAL) liquid 1,000 mL, 1,000  mL, Per Tube, Continuous, Erlene Quan, RD, Last Rate: 45 mL/hr at 01/16/14 1201, 1,000 mL at 01/16/14 1201;  feeding supplement (PRO-STAT SUGAR FREE 64) liquid 30 mL, 30 mL, Per Tube, Daily, Erlene Quan, RD, 30 mL at 01/18/14 0945;  hydrALAZINE (APRESOLINE) injection 10 mg, 10 mg, Intravenous, Q4H PRN, Gardiner Barefoot, NP, 10 mg at 01/19/14 4193 levothyroxine (SYNTHROID, LEVOTHROID) tablet 50 mcg, 50 mcg, Per Tube, QAC breakfast, Samella Parr, NP, 50 mcg at 01/19/14 1126;  metoprolol tartrate (LOPRESSOR) 25 mg/10 mL oral suspension 25 mg, 25 mg, Per Tube, BID, Geradine Girt, DO, 25 mg at 01/18/14 2100;  pantoprazole sodium (PROTONIX) 40 mg/20 mL oral suspension 40 mg, 40 mg, Per Tube, Daily, Rocky Crafts Hammons, RPH, 40 mg at 01/18/14 7902 senna-docusate (Senokot-S) tablet 1 tablet, 1 tablet, Oral, QHS PRN, Toy Baker, MD;  vancomycin (VANCOCIN) 1,500 mg in sodium chloride 0.9 % 500 mL IVPB, 1,500 mg, Intravenous, Q24H, Rogue Bussing, RPH, 1,500 mg at 01/19/14 4097;  zolpidem (AMBIEN) tablet 5 mg, 5 mg, Per Tube, QHS PRN, Samella Parr, NP  Patients Current Diet: Dysphagia Level 3  Precautions / Restrictions Precautions Precautions: Fall Precaution Comments: NG tube; eyes open; difficulty tracking and looking Rt today Restrictions Weight Bearing Restrictions: No   Prior Activity Level Household:  (mainly stays at home for the past 2 months, enjoys reading her Bible and talking on the phone)  [place] Devices/Equipment: None  Prior Functional Level Prior Function Level of Independence: Needs assistance Gait / Transfers Assistance Needed: per dtr, antalgic gait but didn't use cane or walker ADL's / Homemaking Assistance Needed: up until 2 days ago pt indep with shower seat for bathing/dressing. last few days pt was sponge bathing b/c of LE pain Comments: family assisted with meal prep, as able to feed  self  Current Functional Level Cognition  Arousal/Alertness: Awake/alert Overall Cognitive Status: Impaired/Different from baseline Current Attention Level: Sustained Orientation Level:  (answers ?, nods) Following Commands: Follows one step commands inconsistently;Follows one step commands with increased time Safety/Judgement: Decreased awareness of deficits General Comments: pt able to answer more questions consistently; answers were appropriate ~50% of the time; pt was able to orient to year and self today  Attention: Focused Focused Attention: Impaired Focused Attention Impairment: Verbal basic;Functional basic Memory: Impaired Memory Impairment: Storage deficit;Retrieval deficit Awareness: Impaired Awareness Impairment: Intellectual impairment Problem Solving: Impaired Problem Solving Impairment: Verbal basic;Functional basic Behaviors: Perseveration Safety/Judgment: Impaired    Extremity Assessment (includes Sensation/Coordination)          ADLs  Eating/Feeding: Performed;Set up;Min guard Where Assessed - Eating/Feeding: Edge of bed Grooming: Performed;Set up;Min guard Where Assessed - Grooming: Supported sitting Upper Body Bathing: Simulated;Maximal assistance Where Assessed - Upper Body Bathing: Supported sitting Lower Body Bathing: Maximal assistance;+1 Total assistance Lower Body Bathing: Patient Percentage: 0% Where Assessed - Lower Body Bathing: Supported sitting Upper  Body Dressing: Simulated;+1 Total assistance Where Assessed - Upper Body Dressing: Supported sitting Lower Body Dressing: Simulated;+2 Total assistance Lower Body Dressing: Patient Percentage: 10% Where Assessed - Lower Body Dressing: Supported sit to stand Toilet Transfer: Simulated;+2 Total assistance Toilet Transfer: Patient Percentage: 60% Armed forces technical officer Method: Sit to Loss adjuster, chartered: Therapist, occupational and Hygiene: +1 Total  assistance Toileting - Water quality scientist and Hygiene: Patient Percentage: 0% Where Assessed - Toileting Clothing Manipulation and Hygiene: Standing Transfers/Ambulation Related to ADLs: cues for correct hand placement, sequencing ADL Comments: pt more alert with eyes open talkative. Cues for using L UE for assisting with ADLs pt attempting to use R UE when instructed to use L to assist. Pt no longer NPO and sat EOB to simulate self feeding    Mobility  Overal bed mobility: Needs Assistance;+2 for physical assistance Bed Mobility: Supine to Sit Rolling: Max assist;+2 for physical assistance Supine to sit: +2 for physical assistance;Max assist General bed mobility comments: use of helicopter technique to bring pt to sitting position; pt initated movement only with Rt UE on handrail; max multidirectional cues     Transfers  Overall transfer level: Needs assistance Equipment used: 2 person hand held assist Transfers: Sit to/from Stand Sit to Stand: +2 physical assistance;Mod assist Stand pivot transfers: +2 physical assistance;+2 safety/equipment;From elevated surface;Mod assist General transfer comment: lifting assist from bed, cues for anterior weight shift    Ambulation / Gait / Stairs / Wheelchair Mobility  Ambulation/Gait Ambulation Distance (Feet): 2 Feet General Gait Details: took 4-5 steps forward before turning to back to chair with assist for lateral weight shifts and mon cues for taking steps    Posture / Balance Dynamic Sitting Balance Sitting balance - Comments: initially leaning back and to right with right lataral neck flexion; improved to sitting with UE assist and no external support    Special needs/care consideration BiPAP/CPAP no CPM no  Continuous Drip IV no  Dialysis no          Life Vest no  Oxygen - currently on 2 L O2 by nasal cannula Special Bed no Trach Size no Wound Vac (area) no       Skin - no issues                              Bowel mgmt: last BM  on 01-12-14 Bladder mgmt: currently on foley Diabetic mgmt no  Note: pt with current PICC line placed 01-19-14   Previous Home Environment Living Arrangements: Children  Lives With: Family Available Help at Discharge: [place]: No Additional Comments: dtr, granddtr and son live with her  Discharge Living Setting Plans for Discharge Living Setting: Patient's home;Lives with (comment) (lives with several family members) Type of Home at Discharge: House Discharge Home Layout: One level Discharge Home Access: Stairs to enter Entrance Stairs-Rails: Left Entrance Stairs-Number of Steps: 3 Does the patient have any problems obtaining your medications?: No  Social/Family/Support Systems Patient Roles:  Magazine features editor) Contact Information: several children, primary contact is son Yatzari Jonsson Anticipated Caregiver: son [name] and dtr [employer] Anticipated Caregiver's Contact Information: see above Ability/Limitations of Caregiver: no limitations, pt with very supportive family and has 6 children, Caregiver Availability: 24/7 Discharge Plan Discussed with Primary Caregiver: Yes (discussed primarily with son [name]) Is Caregiver In Agreement with Plan?: Yes Does Caregiver/Family have Issues with Lodging/Transportation while Pt is in Rehab?: No  Goals/Additional Needs  Patient/Family Goal for Rehab: min to mod A w/ PT & OT, minA to sup. w/ SLP Expected length of stay: 15-25 days Cultural Considerations: none, Christian faith Dietary Needs: Dys level 3 Equipment Needs: to be determined Pt/Family Agrees to Admission and willing to participate: Yes (spoke with pt's son [name] and several other family members ) Program Orientation Provided & Reviewed with Pt/Caregiver Including Roles  & Responsibilities: Yes  Decrease burden of Care through IP rehab admission: NA   Possible need for SNF placement upon discharge: not anticipated   Patient Condition: This patient's medical  and functional status has changed since the consult dated: 01-13-14 in which the Rehabilitation Physician determined and documented that the patient's condition is appropriate for intensive rehabilitative care in an inpatient rehabilitation facility. See "History of Present Illness" (above) for medical update. Functional changes are: moderate to maximal assist x 2 for transfers and limited steps to chair. Patient's medical and functional status update has been discussed with the Rehabilitation physician and patient remains appropriate for inpatient rehabilitation. Will admit to inpatient rehab today.  Preadmission Screen Completed By:  Nanetta Batty, PT 01/19/2014 1:38 PM ______________________________________________________________________   Discussed status with Dr. Naaman Plummer on 01-19-14 at 1344 and received telephone approval for admission today.  Admission Coordinator:  Nanetta Batty, PT time 1344/Date 01-19-14

## 2014-01-19 NOTE — H&P (View-Only) (Signed)
Stroke Team Progress Note  HISTORY Tammy Fernandez is an 80 y.o. female with a history of hypertension and hyperlipidemia who developed acute onset of inability to speak, gaze to the right and paralysis of left extremities earlier today 01/11/2014. Patient was last seen well at 9:30 a.m. on 01/11/2014. She has no previous history of stroke nor TIA. She's been taking aspirin daily. CT scan of her head showed no acute intracranial abnormality. NIH stroke score was 22.  tPA Given: No: Beyond time under for treatment consideration   SUBJECTIVE Family members at bedside. Patient is getting stuck for labs.  OBJECTIVE Most recent Vital Signs: Filed Vitals:   01/17/14 2019 01/18/14 0200 01/18/14 0434 01/18/14 1000  BP: 184/80 156/77 180/89 185/78  Pulse: 118 113 110 101  Temp: 98.4 F (36.9 C) 98.5 F (36.9 C) 99 F (37.2 C) 98.2 F (36.8 C)  TempSrc: Oral Oral Oral Oral  Resp: 18 20 19 20   Height:      Weight: 81.7 kg (180 lb 1.9 oz)     SpO2: 93% 96% 98% 98%   CBG (last 3)   Recent Labs  01/18/14 0004 01/18/14 0432 01/18/14 0730  GLUCAP 223* 230* 162*    IV Fluid Intake:   . feeding supplement (OSMOLITE 1.5 CAL) 1,000 mL (01/16/14 1201)    MEDICATIONS  .  stroke: mapping our early stages of recovery book   Does not apply Once  . aspirin  300 mg Rectal Daily   Or  . aspirin  325 mg Oral Daily  . feeding supplement (PRO-STAT SUGAR FREE 64)  30 mL Per Tube Daily  . furosemide  20 mg Intravenous Daily  . levothyroxine  50 mcg Per Tube QAC breakfast  . metoprolol tartrate  25 mg Per Tube BID  . pantoprazole sodium  40 mg Per Tube Daily  . vancomycin  1,500 mg Intravenous Q24H   PRN:  acetaminophen, acetaminophen, hydrALAZINE, LORazepam, senna-docusate, zolpidem  Diet:  Dysphagia 3 thin liquids Activity:  Up with assistance DVT Prophylaxis:  SCDs  CLINICALLY SIGNIFICANT STUDIES Basic Metabolic Panel:  Recent Labs Lab 01/13/14 0223  01/15/14 0442 01/16/14 0437  01/18/14 1036  NA 125*  < > 130* 134* 144  K 4.4  < > 3.9 3.6* 3.4*  CL 82*  < > 86* 88* 90*  CO2 29  < > 35* 37* >45*  GLUCOSE 174*  < > 157* 175* 225*  BUN 27*  < > 21 27* 25*  CREATININE 0.93  < > 0.64 0.71 0.62  CALCIUM 9.4  < > 9.8 10.2 11.3*  MG  --   --  2.0  --   --   PHOS 3.3  --  2.4  --   --   < > = values in this interval not displayed. Liver Function Tests:   Recent Labs Lab 01/11/14 1544 01/13/14 0223  AST 22  --   ALT 11  --   ALKPHOS 89  --   BILITOT 0.5  --   PROT 8.3  --   ALBUMIN 3.2* 3.0*   CBC:  Recent Labs Lab 01/11/14 1544  01/16/14 0437 01/18/14 1036  WBC 11.9*  < > 14.0* 16.9*  NEUTROABS 8.8*  --   --   --   HGB 15.0  < > 12.5 13.8  HCT 41.5  < > 38.5 43.0  MCV 84.7  < > 92.1 94.9  PLT 321  < > 281 325  < > = values in  this interval not displayed. Coagulation:   Recent Labs Lab 01/11/14 1544  LABPROT 13.1  INR 1.01   Cardiac Enzymes:   Recent Labs Lab 01/11/14 1544  TROPONINI <0.30   Urinalysis:   Recent Labs Lab 01/11/14 1951  COLORURINE YELLOW  LABSPEC 1.017  PHURINE 6.5  GLUCOSEU NEGATIVE  HGBUR LARGE*  BILIRUBINUR NEGATIVE  KETONESUR 15*  PROTEINUR 30*  UROBILINOGEN 0.2  NITRITE NEGATIVE  LEUKOCYTESUR MODERATE*   Lipid Panel    Component Value Date/Time   CHOL 165 01/12/2014 0606   TRIG 79 01/12/2014 0606   HDL 38* 01/12/2014 0606   CHOLHDL 4.3 01/12/2014 0606   VLDL 16 01/12/2014 0606   LDLCALC 111* 01/12/2014 0606   HgbA1C  Lab Results  Component Value Date   HGBA1C 6.6* 01/12/2014    Urine Drug Screen:     Component Value Date/Time   LABOPIA NONE DETECTED 01/11/2014 1951   COCAINSCRNUR NONE DETECTED 01/11/2014 1951   LABBENZ POSITIVE* 01/11/2014 1951   AMPHETMU NONE DETECTED 01/11/2014 1951   THCU NONE DETECTED 01/11/2014 1951   LABBARB NONE DETECTED 01/11/2014 1951    Alcohol Level:   Recent Labs Lab 01/11/14 1544  ETH <11    CT of the brain 01/11/2014 No intracranial hemorrhage. Question tiny  infarct superior left lenticular nucleus ofquestionable age. No CT evidence of large acute infarct. No intracranial mass lesion noted on this unenhanced exam.   Mr Brain W/Wo Contrast  01/14/2014   1. Small acute lacunar infarct in the right parietal lobe (posterior right MCA territory). No mass effect or hemorrhage. 2. Regressed abnormal signal in the right temporal lobe, and no diffusion changes or postcontrast enhancement. However, I consider this is real gyral abnormality, favored to be seizure sequelae in this setting. 3. No other new intracranial abnormality.      2D Echocardiogram  ejection fraction 55-60%. No cardiac source of embolism identified  Carotid Doppler    EKG  sinus tachycardia rate 107 beats per minute For complete results please see formal report.   EEG  01/18/2014  01/12/2014 Frontal slowing  Therapy Recommendations inpatient rehabilitation  Physical Exam   Neurologic Exam:  Mental Status: she is awake and alert. She speaks in simple sentences and follow commands but is not completely oriented. She knows she is in [place] at the hospital but is not oriented to the name of the hospital or year. Alert, follows simple commands, able to to reach out and grab my hand with right arm and then touch her nose. Will repeat what I asked her to do but unable to tell me where she is, date or year.  Cranial Nerves:  II: blinks to threat bilaterally, pupils equal, round, reactive to light and accommodation  III,IV, VI: ptosis not present, extra-ocular motions intact bilaterally but has a left gaze preference  V,VII: facial droop , facial light touch sensation normal bilaterally  VIII: hearing normal bilaterally  IX,X: gag reflex present  XI: not tested  XII: midline tongue  Motor:  She actually has good strength throughout in both the upper and lower extremities bilaterally. Strength is graded as 4+/5 throughout. Sensory: Pinprick and light touch intact throughout, bilaterally   Deep Tendon Reflexes:  Right: Upper Extremity Left: Upper extremity  biceps (C-5 to C-6) 2/4 biceps (C-5 to C-6) 2/4  tricep (C7) 2/4 triceps (C7) 2/4  Brachioradialis (C6) 2/4 Brachioradialis (C6) 2/4  Lower Extremity Lower Extremity  quadriceps (L-2 to L-4) 1/4 quadriceps (L-2 to L-4) 1/4  Achilles (S1)  0/4 Achilles (S1) 0/4  Plantars:  Mute bilaterally   ASSESSMENT Tammy Fernandez is a 80 y.o. female was found with with mutism and left hemiparesis. Given history and MRI findings Of a small acute lacunar infarct in the right parietal lobe - patient is thought to have had a small stroke which then triggered a seizure. The profound weakness/hemiplegia, speech arrest and confusion most likely was the result of a seizure (MRI findings are resolving). Repeat EEG is pending. Given stroke and seizure together in the setting of bacteremia, suspicious for endocarditis. Recommend TEE to evaluate.  Infarct less likely felt to be thrombotic secondary to small vessel disease, but will await TEE results.  On no antithrombotics prior to admission. Now on aspirin 325 mg orally every day for secondary stroke prevention. Patient with resultant left hemiparesis (resolved), confusion a (improved) and dysphagia (resolved). Work up underway.    Hypertension  Hyperlipidemia cholesterol 165 LDL 111 not on statin prior to admission; not on statin now  Bacteremia due to staphylococcus  Leukocytosis  Urinary tract infection - Vancomycin   A1c 6.6  Hospital day # 7  TREATMENT/PLAN  Continue aspirin 325 mg orally every day for secondary stroke prevention. Given acute stroke and seizure in the setting of bacteremia, have scheduled a TEE to look for embolic source with [facility] for tomorrow (if endo is open, have been told they close during snow) - suspicious for endocardidits. If positive for PFO (patent foramen ovale), check bilateral lower extremity venous dopplers to rule  out DVT as possible source of stroke.  F/u  carotid Dopplers pending statin recommended. Unable to tolerate crestor in the past. Will discuss further with her tomorrow.  Rehab following for possible discharge there.   Annie Main, MSN, RN, ANVP-BC, ANP-BC, [facility] Pager: [phone] 01/18/2014 12:38 PM  I have personally obtained a history, examined the patient, evaluated imaging results, and formulated the assessment and plan of care. I agree with the above. Delia Heady, MD

## 2014-01-19 NOTE — Progress Notes (Signed)
Rehab admissions - We received insurance approval from [employer] and pt/family are interested in coming to inpatient rehab. Discussed paperwork and insurance details.  Spoke with Dr. Benjamine MolaVann who stated that pt is appropriate to admit to inpatient rehab once PICC placed and after LE US done.   Anticipate admit to inpatient rehab later today. Please call me with any questions.  Thanks,  Juliann MuleJanine Pheng Prokop, PT Rehabilitation Admissions Coordinator [phone]

## 2014-01-19 NOTE — Progress Notes (Signed)
Received pt. As a transfer from [facility].Pt. Is from home with family,no equipment at bedside.No skin breakdown.Keep monitoring pt. And assessing her needs.

## 2014-01-19 NOTE — CV Procedure (Signed)
Procedure: TEE  Indication: CVA, bacteremia  Sedation: Versed 2 mg IV, Fentanyl 25 mcg IV  Findings: Please see echo section for full report.  Normal LV size with moderate LV hypertrophy.  EF 65-70%.  Normal RV size and systolic function.  Trivial MR.  No evidence for endocarditis.  No LAA thrombus.  There was an atrial septal aneurysm with a PFO.  There was grade III plaque in the aortic arch.    Given PFO/atrial septal aneurysm, would consider checking legs for DVT.   Marca AnconaDalton Carmin Dibartolo 01/19/2014 10:30 AM

## 2014-01-19 NOTE — Progress Notes (Signed)
   CARE MANAGEMENT NOTE 01/19/2014  Patient:  Tammy Fernandez,Tammy Fernandez   Account Number:  [id number]  Date Initiated:  01/12/2014  Documentation initiated by:  Donn PieriniWEBSTER,KRISTI  Subjective/Objective Assessment:   Pt admitted  non verbal and staring to the right not moving her Left side- probable stroke- neuro following     Action/Plan:   PTA pt lived at home with family- PT/OT/ST evals ordered- CIR screen pending   Anticipated DC Date:  01/19/2014   Anticipated DC Plan:  IP REHAB FACILITY      DC Planning Services  CM consult      Choice offered to / List presented to:             Status of service:  In process, will continue to follow Medicare Important Message given?   (If response is "NO", the following Medicare IM given date fields will be blank) Date Medicare IM given:   Date Additional Medicare IM given:    Discharge Disposition:    Per UR Regulation:  Reviewed for med. necessity/level of care/duration of stay  If discussed at Long Length of Stay Meetings, dates discussed:    Comments:  01/19/2014  [address] Belenda Alviar RN, [place] [phone] Call to [facility]/ Marianna Bravo  [phone]  she noted the patient has been approved for CIR, advised plan to admit this afternoon  01/18/2014  Voice mail message from [place]na [place] [phone] the patient has been approved for CIR  01/15/14- 1400- Donn PieriniKristi Webster RN, BSN [phone] CIR consulted - planning for CIR vs SNF- pt will need 6 wks IV vanc for bacteremia- will need PICC for discharge -

## 2014-01-19 NOTE — Progress Notes (Signed)
Echocardiogram Echocardiogram Transesophageal has been performed.  Dorothey BasemanReel, Pearly Apachito M 01/19/2014, 10:55 AM

## 2014-01-19 NOTE — Progress Notes (Signed)
VASCULAR LAB PRELIMINARY  PRELIMINARY  PRELIMINARY  PRELIMINARY  Bilateral lower extremity venous duplex completed.   Preliminary report: No obvious DVT noted in the right and left lower extremity.  Finley Dinkel, RVT 01/19/2014, 1:09 PM

## 2014-01-19 NOTE — Interval H&P Note (Signed)
History and Physical Interval Note:  01/19/2014 10:14 AM  Tammy Fernandez  has presented today for surgery, with the diagnosis of stroke  The various methods of treatment have been discussed with the patient and family. After consideration of risks, benefits and other options for treatment, the patient has consented to  Procedure(s) with comments: TRANSESOPHAGEAL ECHOCARDIOGRAM (TEE) (N/A) - dayna /ja as a surgical intervention .  The patient's history has been reviewed, patient examined, no change in status, stable for surgery.  I have reviewed the patient's chart and labs.  Questions were answered to the patient's satisfaction.     [name] [employer]

## 2014-01-19 NOTE — Discharge Summary (Addendum)
Physician Discharge Summary  Tammy Fernandez VHQ:[id number] DOB: 1924-01-28 DOA: 01/11/2014  PCP: Tammy Patella, MD  Admit date: 01/11/2014 Discharge date: 01/19/2014  Time spent: 35 minutes  Recommendations for Outpatient Follow-up:  1. vanc 4 weeks- through 3/8 2. TSH  Discharge Diagnoses:  Principal Problem:   Bacteremia due to Staphylococcus Active Problems:   Hypertension   Hyponatremia   Hyperkalemia   Hematuria   E. coli UTI (urinary tract infection)   Acute encephalopathy   Left hemiplegia   Acute respiratory failure with hypoxia   Dysphagia, post-stroke   Discharge Condition: stable  Diet recommendation: DYS 3 thin  Filed Weights   01/16/14 2224 01/17/14 2019 01/18/14 2015  Weight: 86.5 kg (190 lb 11.2 oz) 81.7 kg (180 lb 1.9 oz) 83.099 kg (183 lb 3.2 oz)    History of present illness:  Tammy Fernandez is a 80 y.o. female  has a past medical history of Hyperlipidemia; Hypertension; Anxiety; GERD (gastroesophageal reflux disease); Abnormal heart rhythms; Fatty liver; and Internal hemorrhoid.  Presented with  At 2:30pm patient was noted to be not verbal and staring to the right not moving her Left side. Family states she was last seen normal at 10 AM. Patient was brought to ER CT scan negative for bleed. Neurology have seen patietn and she was not a candidate for tPA. Her Na was noted to be 115. Family states she has been drinking lasrge amount of water for some time. K was noted 5.7.  Family states she has had some vertigo for few days but they were attributing this to her sinus problems.  Hsopitalist was called for admission and further work up.  Family states she has irregular heart beat but only taking aspirin 81 mg  Family noted possible blood in urine yesterday but were not certain   Hospital Course:  Left side weakness/encephalopathy/smal Lacunar infarct R parietal lobe  - Initial MRI equivocal but FU positive for small CVA  -incidentally signal  change right temporal lobe ?? Seizure related  -TTE negative for vegetation  TEE showed small PFO- duplex negative -Neuro treating as possible meningitis-?? Dc Rocephin and Acyclovir  -ID does not favor pursuit of LP since would not change tx outcome  -PT/OT eval- to CIR  No statin due to not tolerating in the past  Sepsis due to E Coli UTI and Methacillin resistant coag-negative staph bacteremia  -ID recomends Vanc and PICC which was attempted but not able to be placed by PICC team- will need IR to perform procedure- ordered  -recent issues with persistent sinusitis prior to admission could be source  -essentially pan sensitive e coli (resistant to amp)  vanc x 4 weeks  Acute dysphagia  -resolved  -SLP to follow  Acute respiratory failure with hypoxia  -off O2  Hypertension  - metoprolol  Hyponatremia  - appears to have a chronic component upon discussion with family  - seemed dehydrated and sodium slowly improved with fluids  - appreciate nephro consult- serum cortisol normal  - Nephro feels now euvolemic and initiated fluid restriction 2/11  D/c lasix as over-diuresed   Hyperkalemia  - resolved   Hematuria  - likely due to UTI   Low TSH  - Free T4 slightly elevated so suspect sick euthyroid syndrome  - cont usual dose Levothyroxine  -repeat TSH 8-12 weeks post dc   Procedures:  TEE   Consultations:  Neuro  cards  Discharge Exam: Filed Vitals:   01/19/14 1141  BP: 136/57  Pulse:  110  Temp: 98.9 F (37.2 C)  Resp: 18    General: pleasant/cooperative Cardiovascular: rrr Respiratory: clear anterior  Discharge Instructions     Medication List    STOP taking these medications       aspirin EC 81 MG tablet  Replaced by:  aspirin 325 MG tablet     GAS-X PO     HAWTHORN BERRIES PO     lisinopril 10 MG tablet  Commonly known as:  PRINIVIL,ZESTRIL     meclizine 12.5 MG tablet  Commonly known as:  ANTIVERT     OVER THE COUNTER MEDICATION      promethazine 25 MG tablet  Commonly known as:  PHENERGAN     propranolol ER 60 MG 24 hr capsule  Commonly known as:  INDERAL LA     RED YEAST RICE PO     TAGAMET PO      TAKE these medications       ALPRAZolam 0.5 MG tablet  Commonly known as:  XANAX  Take 1 tablet (0.5 mg total) by mouth 2 (two) times daily.     aspirin 325 MG tablet  Take 1 tablet (325 mg total) by mouth daily.     calcium carbonate 500 MG chewable tablet  Commonly known as:  TUMS - dosed in mg elemental calcium  Chew 1 tablet by mouth daily as needed for indigestion or heartburn.     feeding supplement (OSMOLITE 1.5 CAL) Liqd  Place 1,000 mLs into feeding tube continuous.     feeding supplement (PRO-STAT SUGAR FREE 64) Liqd  Place 30 mLs into feeding tube daily.     levothyroxine 50 MCG tablet  Commonly known as:  SYNTHROID, LEVOTHROID  Take 50 mcg by mouth daily.     metoprolol tartrate 25 mg/10 mL Susp  Commonly known as:  LOPRESSOR  Take 10 mLs (25 mg total) by mouth 2 (two) times daily.     MYLANTA PO  Take 5 mLs by mouth daily as needed (constipation).     pantoprazole sodium 40 mg/20 mL Pack  Commonly known as:  PROTONIX  Take 20 mLs (40 mg total) by mouth daily.     sodium chloride 0.9 % SOLN 500 mL with vancomycin 10 G SOLR 1,500 mg  Inject 1,500 mg into the vein daily.     VITAMIN C PO  Take 1 tablet by mouth daily.       Allergies  Allergen Reactions  . Crestor [Rosuvastatin Calcium]   . Sulfa Drugs Cross Reactors Rash      The results of significant diagnostics from this hospitalization (including imaging, microbiology, ancillary and laboratory) are listed below for reference.    Significant Diagnostic Studies: Ct Head Wo Contrast  01/11/2014   CLINICAL DATA:  Left-sided weakness with right-sided gaze and slurred speech. Hypertension. Hyperlipidemia.  EXAM: CT HEAD WITHOUT CONTRAST  TECHNIQUE: Contiguous axial images were obtained from the base of the skull through  the vertex without intravenous contrast.  COMPARISON:  No comparisons available.  FINDINGS: No intracranial hemorrhage.  Question tiny infarct superior left lenticular nucleus of questionable age.  No CT evidence of large acute infarct.  No intracranial mass lesion noted on this unenhanced exam.  No hydrocephalus.  IMPRESSION: No intracranial hemorrhage.  Question tiny infarct superior left lenticular nucleus of questionable age.  No CT evidence of large acute infarct.  No intracranial mass lesion noted on this unenhanced exam.   Electronically Signed   By: Kandice HamsSteve  Olson M.D.  On: 01/11/2014 16:10   Mr Angiogram Head Wo Contrast  01/11/2014   CLINICAL DATA:  Acute onset of inability to speak, gaze to the right and left paralysis. History of hypertension and hyperlipidemia.  EXAM: MRI HEAD WITHOUT CONTRAST  TECHNIQUE: Multiplanar, multiecho pulse sequences of the brain and surrounding structures were obtained without intravenous contrast. 3D time-of-flight MRA through the circle of Willis with maximum intensity projected reformations.  COMPARISON:  CT of the head January 11, 2014 at 1602 hr  FINDINGS: MRI head: Mild motion degraded examination. No reduced diffusion to suggest acute ischemia. No susceptibility artifact to suggest hemorrhage.  The ventricles and sulci are normal for patient's age. Scattered subcentimeter supratentorial white matter T2 hyperintensities are less than expected for age without midline shift or mass effect. Subtle asymmetric T2 hyperintense signal within the right temporal cortex, axial 8/21, apparent on the axial FLAIR sequence as well though, less conspicuous on the coronal T2.  No abnormal extra-axial fluid collections. Basal cisterns are patent.  Status post bilateral ocular lens implants. Paranasal sinuses and mastoid air cells appear well-aerated. No abnormal sellar expansion, cerebellar tonsils descend less than 5 mm below the foramen magnum, and do not appear pointed in  appearance.  MRA head: Normal flow related enhancement of the cervical internal carotid arteries, with slight signal loss at a tortuous segment of the right cervical internal carotid artery, consistent with anatomical variant. Normal flow related enhancement of the petrous internal carotid arteries. There is a long segment of narrowing of the right cavernous internal carotid artery, less than 50% with normal flow related enhancement supra clinoid internal carotid arteries. The left A1 segment is congenitally dominant, with predominately azygos anterior cerebral artery. The right middle cerebral artery is generally smaller than the left, this may be in part accentuated by tilt within the scanner. There is poor flow early enhancement of the right M3 branches, best seen on the reformations.  Nearly codominant vertebral arteries with normal flow of enhanced in the vertebrobasilar system and main branch vessels. Normal flow related enhancement of posterior cerebral arteries.  No large vessel occlusion, aneurysm, suspicious luminal irregularity within the anterior or posterior circulation. Very mild diffuse irregularity of the intracranial vessels suggests atherosclerosis.  IMPRESSION: MRI of the head: No convincing evidence of reduced diffusion to suggest acute ischemia, however there is mild bright signal within the right temporal cortex, in an area predominately obscured on the diffusion-weighted sequences and, early stroke could have this appearance.  Otherwise unremarkable MRI of the head for age.  MRA of the head: Poor enhancement of right M3 branches concerning for high-grade stenosis/ possible embolus.  Narrowing of the right carotid siphon without hemodynamically significant stenosis through the circle Willis.  Critical Value/emergent results were called by telephone at the time of interpretation on 01/11/2014 at 10:31 PM to Dr. Dione Booze , who verbally acknowledged these results.   Electronically Signed   By:  Awilda Metro   On: 01/11/2014 22:33   Mr Brain Wo Contrast  01/11/2014   CLINICAL DATA:  Acute onset of inability to speak, gaze to the right and left paralysis. History of hypertension and hyperlipidemia.  EXAM: MRI HEAD WITHOUT CONTRAST  TECHNIQUE: Multiplanar, multiecho pulse sequences of the brain and surrounding structures were obtained without intravenous contrast. 3D time-of-flight MRA through the circle of Willis with maximum intensity projected reformations.  COMPARISON:  CT of the head January 11, 2014 at 1602 hr  FINDINGS: MRI head: Mild motion degraded examination. No reduced diffusion to  suggest acute ischemia. No susceptibility artifact to suggest hemorrhage.  The ventricles and sulci are normal for patient's age. Scattered subcentimeter supratentorial white matter T2 hyperintensities are less than expected for age without midline shift or mass effect. Subtle asymmetric T2 hyperintense signal within the right temporal cortex, axial 8/21, apparent on the axial FLAIR sequence as well though, less conspicuous on the coronal T2.  No abnormal extra-axial fluid collections. Basal cisterns are patent.  Status post bilateral ocular lens implants. Paranasal sinuses and mastoid air cells appear well-aerated. No abnormal sellar expansion, cerebellar tonsils descend less than 5 mm below the foramen magnum, and do not appear pointed in appearance.  MRA head: Normal flow related enhancement of the cervical internal carotid arteries, with slight signal loss at a tortuous segment of the right cervical internal carotid artery, consistent with anatomical variant. Normal flow related enhancement of the petrous internal carotid arteries. There is a long segment of narrowing of the right cavernous internal carotid artery, less than 50% with normal flow related enhancement supra clinoid internal carotid arteries. The left A1 segment is congenitally dominant, with predominately azygos anterior cerebral artery. The  right middle cerebral artery is generally smaller than the left, this may be in part accentuated by tilt within the scanner. There is poor flow early enhancement of the right M3 branches, best seen on the reformations.  Nearly codominant vertebral arteries with normal flow of enhanced in the vertebrobasilar system and main branch vessels. Normal flow related enhancement of posterior cerebral arteries.  No large vessel occlusion, aneurysm, suspicious luminal irregularity within the anterior or posterior circulation. Very mild diffuse irregularity of the intracranial vessels suggests atherosclerosis.  IMPRESSION: MRI of the head: No convincing evidence of reduced diffusion to suggest acute ischemia, however there is mild bright signal within the right temporal cortex, in an area predominately obscured on the diffusion-weighted sequences and, early stroke could have this appearance.  Otherwise unremarkable MRI of the head for age.  MRA of the head: Poor enhancement of right M3 branches concerning for high-grade stenosis/ possible embolus.  Narrowing of the right carotid siphon without hemodynamically significant stenosis through the circle Willis.  Critical Value/emergent results were called by telephone at the time of interpretation on 01/11/2014 at 10:31 PM to Dr. Dione Booze , who verbally acknowledged these results.   Electronically Signed   By: Awilda Metro   On: 01/11/2014 22:33   Mr [name] Wo Contrast  01/14/2014   CLINICAL DATA:  80 year old female with seizure. Possible abnormal right temporal lobe. Possible stroke. Initial encounter.  EXAM: MRI HEAD WITHOUT AND WITH CONTRAST  TECHNIQUE: Multiplanar, multiecho pulse sequences of the brain and surrounding structures were obtained without and with intravenous contrast.  CONTRAST:  15mL MULTIHANCE GADOBENATE DIMEGLUMINE 529 MG/ML IV SOLN  COMPARISON:  01/11/2014 brain MRI, MRA.  FINDINGS: Suspected cortical T2 hyperintensity in the right temporal lobe has  regressed. No definite diffusion-weighted abnormality in this region today. Following contrast, no right temporal lobe enhancement identified.  There is a small 8 mm cortical versus subcortical white matter focus of restricted diffusion in the right parietal lobe (series 3, image 19 and series 4, image 7).  No other diffusion abnormality identified. Major intracranial vascular flow voids are stable.  Elsewhere stable gray and white matter signal. No acute intracranial hemorrhage identified. No midline shift, mass effect, or evidence of intracranial mass lesion. No ventriculomegaly. Stable pituitary, cervicomedullary junction visualized cervical spine. No abnormal enhancement identified.  Normal bone marrow signal. Visualized scalp  soft tissues are within normal limits. Visible internal auditory structures appear normal. Stable orbits soft tissues. Visualized paranasal sinuses and mastoids are clear.  IMPRESSION: 1. Small acute lacunar infarct in the right parietal lobe (posterior right MCA territory). No mass effect or hemorrhage. 2. Regressed abnormal signal in the right temporal lobe, and no diffusion changes or postcontrast enhancement. However, I consider this is real gyral abnormality, favored to be seizure sequelae in this setting. 3. No other new intracranial abnormality.   Electronically Signed   By: Augusto Gamble M.D.   On: 01/14/2014 19:48   Ir Fluoro Guide Cv Line Right  01/19/2014   CLINICAL DATA:  Sepsis  EXAM: Right upper extremity PICC LINE PLACEMENT WITH ULTRASOUND AND FLUOROSCOPIC GUIDANCE  FLUOROSCOPY TIME:  6 seconds.  PROCEDURE: The patient was advised of the possible risks andcomplications and agreed to undergo the procedure. The patient was then brought to the angiographic suite for the procedure.  The right arm was prepped with chlorhexidine, drapedin the usual sterile fashion using maximum barrier technique (cap and mask, sterile gown, sterile gloves, large sterile sheet, hand hygiene and  cutaneous antisepsis) and infiltrated locally with 1% Lidocaine.  Ultrasound demonstrated patency of the right cephalic vein, and this was documented with an image. Under real-time ultrasound guidance, this vein was accessed with a 21 gauge micropuncture needle and image documentation was performed. A 0.018 wire was introduced in to the vein. Over this, a 5 [place] double lumen Power PICC, trimmed to 37 cm, was advanced to the lower SVC/right atrial junction. Fluoroscopy during the procedure and fluoro spot radiograph confirms appropriate catheter position. The catheter was flushed and covered with asterile dressing.  Complications: None  IMPRESSION: Successful right arm Power PICC line placement with ultrasound and fluoroscopic guidance. The catheter is ready for use.   Electronically Signed   By: Maryclare Bean M.D.   On: 01/19/2014 14:28   Ir US Guide Vasc Access Right  01/19/2014   CLINICAL DATA:  Sepsis  EXAM: Right upper extremity PICC LINE PLACEMENT WITH ULTRASOUND AND FLUOROSCOPIC GUIDANCE  FLUOROSCOPY TIME:  6 seconds.  PROCEDURE: The patient was advised of the possible risks andcomplications and agreed to undergo the procedure. The patient was then brought to the angiographic suite for the procedure.  The right arm was prepped with chlorhexidine, drapedin the usual sterile fashion using maximum barrier technique (cap and mask, sterile gown, sterile gloves, large sterile sheet, hand hygiene and cutaneous antisepsis) and infiltrated locally with 1% Lidocaine.  Ultrasound demonstrated patency of the right cephalic vein, and this was documented with an image. Under real-time ultrasound guidance, this vein was accessed with a 21 gauge micropuncture needle and image documentation was performed. A 0.018 wire was introduced in to the vein. Over this, a 5 [place] double lumen Power PICC, trimmed to 37 cm, was advanced to the lower SVC/right atrial junction. Fluoroscopy during the procedure and fluoro spot radiograph  confirms appropriate catheter position. The catheter was flushed and covered with asterile dressing.  Complications: None  IMPRESSION: Successful right arm Power PICC line placement with ultrasound and fluoroscopic guidance. The catheter is ready for use.   Electronically Signed   By: Maryclare Bean M.D.   On: 01/19/2014 14:28   Dg Chest Port 1 View  01/12/2014   CLINICAL DATA:  Fever.  EXAM: PORTABLE CHEST - 1 VIEW  COMPARISON:  NM MYOCAR MULTI w/SPECT w/WALL MOTION / EF dated 08/27/2013  FINDINGS: Patient is rotated to the right. Chronic elevation of  the right hemidiaphragm opacity at the right chest. Tortuous thoracic aorta appears similar. Lung volumes are lower than on prior studies. Monitoring leads project over the chest. There is no gross focal consolidation identified. Cardiomediastinal contours appear similar allowing for rotation.  IMPRESSION: Lower lung volumes with chronic elevation of the right hemidiaphragm. No gross acute cardiopulmonary disease.   Electronically Signed   By: Andreas Newport M.D.   On: 01/12/2014 01:27   Dg Abd Portable 1v  01/14/2014   CLINICAL DATA:  Check feeding tube placement  EXAM: PORTABLE ABDOMEN - 1 VIEW  COMPARISON:  None.  FINDINGS: A nasogastric catheter is noted just beyond the gastroesophageal junction. Scattered large and small bowel gas is noted. No obstructive changes are seen.   Electronically Signed   By: Alcide Clever M.D.   On: 01/14/2014 20:15    Microbiology: Recent Results (from the past 240 hour(s))  URINE CULTURE     Status: None   Collection Time    01/11/14  7:51 PM      Result Value Ref Range Status   Specimen Description URINE, CATHETERIZED   Final   Special Requests NONE   Final   Culture  Setup Time     Final   Value: 01/11/2014 21:34     Performed at [facility]   Colony Count     Final   Value: >=100,000 COLONIES/ML     Performed at [facility]   Culture     Final   Value: ESCHERICHIA COLI     Performed at [facility]   Report Status 01/13/2014 FINAL   Final   Organism ID, Bacteria ESCHERICHIA COLI   Final  CULTURE, BLOOD (ROUTINE X 2)     Status: None   Collection Time    01/12/14  6:00 AM      Result Value Ref Range Status   Specimen Description BLOOD RIGHT HAND   Final   Special Requests BOTTLES DRAWN AEROBIC ONLY 5CC   Final   Culture  Setup Time     Final   Value: 01/12/2014 10:21     Performed at [facility]   Culture     Final   Value: STAPHYLOCOCCUS SPECIES (COAGULASE NEGATIVE)     Note: SUSCEPTIBILITIES PERFORMED ON PREVIOUS CULTURE WITHIN THE LAST 5 DAYS.     Note: Gram Stain Report Called to,Read Back By and Verified With: Gray Bernhardt [id number] BRMEL     Performed at [facility]   Report Status 01/15/2014 FINAL   Final  CULTURE, BLOOD (ROUTINE X 2)     Status: None   Collection Time    01/12/14  6:06 AM      Result Value Ref Range Status   Specimen Description BLOOD RIGHT ANTECUBITAL   Final   Special Requests BOTTLES DRAWN AEROBIC ONLY 3CC   Final   Culture  Setup Time     Final   Value: 01/12/2014 10:21     Performed at [facility]   Culture     Final   Value: STAPHYLOCOCCUS SPECIES (COAGULASE NEGATIVE)     Note: RIFAMPIN AND GENTAMICIN SHOULD NOT BE USED AS SINGLE DRUGS FOR TREATMENT OF STAPH INFECTIONS.     Note: Gram Stain Report Called to,Read Back By and Verified With: Kerby Moors [id number] BRMEL     Performed at [facility]   Report Status 01/15/2014 FINAL   Final   Organism ID, Bacteria STAPHYLOCOCCUS SPECIES (COAGULASE  NEGATIVE)   Final  MRSA PCR SCREENING     Status: None   Collection Time    01/12/14  6:35 AM      Result Value Ref Range Status   MRSA by PCR NEGATIVE  NEGATIVE Final   Comment:            The GeneXpert MRSA Assay (FDA     approved for NASAL specimens     only), is one component of a     comprehensive MRSA colonization     surveillance program. It is not     intended to diagnose MRSA      infection nor to guide or     monitor treatment for     MRSA infections.  CULTURE, BLOOD (ROUTINE X 2)     Status: None   Collection Time    01/18/14 12:20 PM      Result Value Ref Range Status   Specimen Description BLOOD RIGHT HAND   Final   Special Requests BOTTLES DRAWN AEROBIC ONLY 5CC   Final   Culture  Setup Time     Final   Value: 01/18/2014 16:16     Performed at [facility]   Culture     Final   Value:        BLOOD CULTURE RECEIVED NO GROWTH TO DATE CULTURE WILL BE HELD FOR 5 DAYS BEFORE ISSUING A FINAL NEGATIVE REPORT     Performed at [facility]   Report Status PENDING   Incomplete     Labs: Basic Metabolic Panel:  Recent Labs Lab 01/12/14 1726 01/13/14 0223 01/14/14 0239 01/15/14 0442 01/16/14 0437 01/18/14 1036 01/19/14 0335  NA 123* 125* 127* 130* 134* 144 139  K 4.4 4.4 4.2 3.9 3.6* 3.4* 3.7  CL 82* 82* 86* 86* 88* 90* 88*  CO2 28 29 26  35* 37* >45* 44*  GLUCOSE 159* 174* 130* 157* 175* 225* 107*  BUN 23 27* 24* 21 27* 25* 27*  CREATININE 0.96 0.93 0.66 0.64 0.71 0.62 0.63  CALCIUM 9.5 9.4 9.4 9.8 10.2 11.3* 10.4  MG  --   --   --  2.0  --   --   --   PHOS  --  3.3  --  2.4  --   --   --    Liver Function Tests:  Recent Labs Lab 01/13/14 0223  ALBUMIN 3.0*   No results found for this basename: LIPASE, AMYLASE,  in the last 168 hours No results found for this basename: AMMONIA,  in the last 168 hours CBC:  Recent Labs Lab 01/14/14 0239 01/15/14 0845 01/16/14 0437 01/18/14 1036 01/19/14 0335  WBC 24.9* 17.6* 14.0* 16.9* 14.4*  HGB 12.2 11.9* 12.5 13.8 12.6  HCT 35.7* 35.5* 38.5 43.0 38.7  MCV 87.5 89.2 92.1 94.9 93.3  PLT 290 257 281 325 PLATELET CLUMPS NOTED ON SMEAR, COUNT APPEARS ADEQUATE   Cardiac Enzymes: No results found for this basename: CKTOTAL, CKMB, CKMBINDEX, TROPONINI,  in the last 168 hours BNP: BNP (last 3 results) No results found for this basename: PROBNP,  in the last 8760 hours CBG:  Recent  Labs Lab 01/18/14 1559 01/18/14 2009 01/18/14 2357 01/19/14 0432 01/19/14 0754  GLUCAP 146* 122* 145* 119* 105*       Signed:  Mackenna Kamer  [facility] 01/19/2014, 2:58 PM

## 2014-01-19 NOTE — Progress Notes (Signed)
Stroke Team Progress Note  HISTORY Tammy Fernandez is an 80 y.o. female with a history of hypertension and hyperlipidemia who developed acute onset of inability to speak, gaze to the right and paralysis of left extremities earlier today 01/11/2014. Patient was last seen well at 9:30 a.m. on 01/11/2014. She has no previous history of stroke nor TIA. She's been taking aspirin daily. CT scan of her head showed no acute intracranial abnormality. NIH stroke score was 22.  tPA Given: No: Beyond time under for treatment consideration   SUBJECTIVE Patient currently in vascular lab getting LE venous dopplers.  OBJECTIVE Most recent Vital Signs: Filed Vitals:   01/19/14 1015 01/19/14 1036 01/19/14 1040 01/19/14 1141  BP: 163/40 166/43 166/43 136/57  Pulse:  103 103 110  Temp:  98.1 F (36.7 C)  98.9 F (37.2 C)  TempSrc:  Oral    Resp:  18 18 18   Height:      Weight:      SpO2:   99% 97%   CBG (last 3)   Recent Labs  01/18/14 2357 01/19/14 0432 01/19/14 0754  GLUCAP 145* 119* 105*    IV Fluid Intake:   . sodium chloride    . feeding supplement (OSMOLITE 1.5 CAL) 1,000 mL (01/16/14 1201)    MEDICATIONS  .  stroke: mapping our early stages of recovery book   Does not apply Once  . ALPRAZolam  0.5 mg Oral BID  . aspirin  300 mg Rectal Daily   Or  . aspirin  325 mg Oral Daily  . feeding supplement (PRO-STAT SUGAR FREE 64)  30 mL Per Tube Daily  . levothyroxine  50 mcg Per Tube QAC breakfast  . metoprolol tartrate  25 mg Per Tube BID  . pantoprazole sodium  40 mg Per Tube Daily  . vancomycin  1,500 mg Intravenous Q24H   PRN:  acetaminophen, acetaminophen, hydrALAZINE, senna-docusate, zolpidem  Diet:  Dysphagia 3 thin liquids Activity:  Up with assistance DVT Prophylaxis:  SCDs  CLINICALLY SIGNIFICANT STUDIES Basic Metabolic Panel:  Recent Labs Lab 01/13/14 0223  01/15/14 0442  01/18/14 1036 01/19/14 0335  NA 125*  < > 130*  < > 144 139  K 4.4  < > 3.9  < > 3.4* 3.7  CL  82*  < > 86*  < > 90* 88*  CO2 29  < > 35*  < > >45* 44*  GLUCOSE 174*  < > 157*  < > 225* 107*  BUN 27*  < > 21  < > 25* 27*  CREATININE 0.93  < > 0.64  < > 0.62 0.63  CALCIUM 9.4  < > 9.8  < > 11.3* 10.4  MG  --   --  2.0  --   --   --   PHOS 3.3  --  2.4  --   --   --   < > = values in this interval not displayed. Liver Function Tests:   Recent Labs Lab 01/13/14 0223  ALBUMIN 3.0*   CBC:   Recent Labs Lab 01/18/14 1036 01/19/14 0335  WBC 16.9* 14.4*  HGB 13.8 12.6  HCT 43.0 38.7  MCV 94.9 93.3  PLT 325 PLATELET CLUMPS NOTED ON SMEAR, COUNT APPEARS ADEQUATE   Coagulation:  No results found for this basename: LABPROT, INR,  in the last 168 hours Cardiac Enzymes:  No results found for this basename: CKTOTAL, CKMB, CKMBINDEX, TROPONINI,  in the last 168 hours Urinalysis:  No results found  for this basename: COLORURINE, APPERANCEUR, LABSPEC, PHURINE, GLUCOSEU, HGBUR, BILIRUBINUR, KETONESUR, PROTEINUR, UROBILINOGEN, NITRITE, LEUKOCYTESUR,  in the last 168 hours Lipid Panel    Component Value Date/Time   CHOL 165 01/12/2014 0606   TRIG 79 01/12/2014 0606   HDL 38* 01/12/2014 0606   CHOLHDL 4.3 01/12/2014 0606   VLDL 16 01/12/2014 0606   LDLCALC 111* 01/12/2014 0606   HgbA1C  Lab Results  Component Value Date   HGBA1C 6.6* 01/12/2014    Urine Drug Screen:     Component Value Date/Time   LABOPIA NONE DETECTED 01/11/2014 1951   COCAINSCRNUR NONE DETECTED 01/11/2014 1951   LABBENZ POSITIVE* 01/11/2014 1951   AMPHETMU NONE DETECTED 01/11/2014 1951   THCU NONE DETECTED 01/11/2014 1951   LABBARB NONE DETECTED 01/11/2014 1951    Alcohol Level:  No results found for this basename: ETH,  in the last 168 hours  CT of the brain 01/11/2014 No intracranial hemorrhage. Question tiny infarct superior left lenticular nucleus ofquestionable age. No CT evidence of large acute infarct. No intracranial mass lesion noted on this unenhanced exam.   Mr Brain W/Wo Contrast  01/14/2014   1. Small acute  lacunar infarct in the right parietal lobe (posterior right MCA territory). No mass effect or hemorrhage. 2. Regressed abnormal signal in the right temporal lobe, and no diffusion changes or postcontrast enhancement. However, I consider this is real gyral abnormality, favored to be seizure sequelae in this setting. 3. No other new intracranial abnormality.      2D Echocardiogram  ejection fraction 55-60%. No cardiac source of embolism identified  Carotid Doppler  Bilateral: 1-39% ICA stenosis. ECA not insonated. Vertebral artery flow is antegrade.  LE Venous Dopplers  No obvious DVT noted in the right and left lower extremity.  TEE  Normal LV size with moderate LV hypertrophy. EF 65-70%. Normal RV size and systolic function. Trivial MR. No evidence for endocarditis. No LAA thrombus. There was an atrial septal aneurysm with a PFO. There was grade III plaque in the aortic arch.   EKG  sinus tachycardia rate 107 beats per minute For complete results please see formal report.   EEG  01/18/2014 This is a normal awake electroencephalogram. No epileptiform activity is noted.  01/12/2014 Frontal slowing  Therapy Recommendations inpatient rehabilitation  Physical Exam   Neurologic Exam:  Mental Status: she is awake and alert. She speaks in simple sentences and follow commands but is not completely oriented. She knows she is in [place] at the hospital but is not oriented to the name of the hospital or year. Alert, follows simple commands, able to to reach out and grab my hand with right arm and then touch her nose. Will repeat what I asked her to do but unable to tell me where she is, date or year.  Cranial Nerves:  II: blinks to threat bilaterally, pupils equal, round, reactive to light and accommodation  III,IV, VI: ptosis not present, extra-ocular motions intact bilaterally but has a left gaze preference  V,VII: facial droop , facial light touch sensation normal bilaterally  VIII: hearing normal  bilaterally  IX,X: gag reflex present  XI: not tested  XII: midline tongue  Motor:  She actually has good strength throughout in both the upper and lower extremities bilaterally. Strength is graded as 4+/5 throughout. Sensory: Pinprick and light touch intact throughout, bilaterally  Deep Tendon Reflexes:  Right: Upper Extremity Left: Upper extremity  biceps (C-5 to C-6) 2/4 biceps (C-5 to C-6) 2/4  tricep (C7)  2/4 triceps (C7) 2/4  Brachioradialis (C6) 2/4 Brachioradialis (C6) 2/4  Lower Extremity Lower Extremity  quadriceps (L-2 to L-4) 1/4 quadriceps (L-2 to L-4) 1/4  Achilles (S1) 0/4 Achilles (S1) 0/4  Plantars:  Mute bilaterally   ASSESSMENT Ms. SPRING SAN is a 80 y.o. female was found with with mutism and left hemiparesis. Given history and MRI findings Of a small acute lacunar infarct in the right parietal lobe - patient is thought to have had a small stroke which then triggered a seizure. The profound weakness/hemiplegia, speech arrest and confusion most likely was the result of a seizure (MRI findings are resolving). Repeat EEG is negative for seizures. TEE negative for endocarditis. Infarct likely thrombotic secondary to small vessel disease. On no antithrombotics prior to admission. Now on aspirin 325 mg orally every day for secondary stroke prevention. Patient with resultant left hemiparesis (resolved), confusion a (improved) and dysphagia (resolved). Work up completed. IP rehab recommended.   Hypertension Hyperlipidemia cholesterol 165 LDL 111 not on statin prior to admission; not on statin now. statin recommended; will not start statin as unable to tolerate in the past.   Bacteremia due to staphylococcus  Leukocytosis  Urinary tract infection - Vancomycin   A1c 6.6 PFO (patent foramen ovale) likely an incidental finding.    Hospital day # 8  TREATMENT/PLAN  Continue aspirin 325 mg orally every day for secondary stroke prevention. Agree with plans to transfer  to Rehab  Will sign off Follow up Dr. Pearlean Brownie in 2 months   Annie Main, MSN, RN, ANVP-BC, ANP-BC, GNP-BC [facility] Pager: [phone] 01/19/2014 12:41 PM  I have personally obtained a history, examined the patient, evaluated imaging results, and formulated the assessment and plan of care. I agree with the above.  Delia Heady, MD

## 2014-01-19 NOTE — Progress Notes (Signed)
Progress Note    Tammy PYPER WUJ:[id number] DOB: Apr 27, 1924 DOA: 01/11/2014 PCP: [name], MD  Brief narrative: 80 y.o. female presenting on 01/11/2014 with a past medical history of Hyperlipidemia; Hypertension; Anxiety; GERD (gastroesophageal reflux disease); Abnormal heart rhythms; Fatty liver; and Internal hemorrhoid who was noted by family to be nonverbal, looking to the right and not moving her left side at 2:30 PM after last being seen normal at 10 AM. CT head in ER was negative. She was also noted to have a low sodium and a UTI.   Subjective: Awake, no new c/o  Assessment/Plan:  Left side weakness/encephalopathy/smal Lacunar infarct R parietal lobe - Initial MRI equivocal but FU positive for small CVA -incidentally signal change right temporal lobe ?? Seizure related -TTE negative for vegetation -Neuro treating as possible meningitis-?? Dc Rocephin and Acyclovir -ID does not favor pursuit of LP since would not change tx outcome TEE done with small PFO- ordered duplex -PT/OT eval- CIR consulted  Sepsis due to E Coli UTI and Methacillin resistant coag-negative staph bacteremia -ID recomends Vanc and PICC which was attempted but not able to be placed by PICC team- will need IR to perform procedure- ordered -recent issues with persistent sinusitis prior to admission could be source -essentially pan sensitive e coli (resistant to amp)  Acute dysphagia -resolved   Acute respiratory failure with hypoxia -seems r/t hypoventilation and AMS -tol O2 wean -no focal infiltrate on CXR    Hypertension - cont IV Metoprolol-dose increased  2/11    Hyponatremia - appears to have a chronic component upon discussion with family - seemed dehydrated and sodium slowly improved with fluids - appreciate nephro consult- serum cortisol normal - Nephro feels now euvolemic and initiated fluid restriction 2/11 D/c lasix as overdiuresed    Hyperkalemia - resolved     Hematuria - likely due to UTI    Low TSH -  Free T4 slightly elevated so suspect sick euthyroid syndrome - cont usual dose Levothyroxine  -repeat TSH 8-12 weeks post dc    Code Status: Full code Family Communication: with daughter at bedside  Disposition Plan: CIR once PICC placed and b/l duplex done  Consultants: neuro  Procedures: TTE 2/10 Left ventricle: The cavity size was normal. There was mild concentric hypertrophy. Systolic function was normal. The estimated ejection fraction was in the range of 55% to 60%. Wall motion was normal; there were no regional wall motion abnormalities. Doppler parameters are consistent with abnormal left ventricular relaxation (grade 1 diastolic dysfunction). - No cardiac source of embolism was identified, but cannot be ruled out on the basis of this examination. 1. Consider transesophageal echocardiography if clinically indicated. 2. Consider transesophageal echocardiography if clinically indicated in order to exclude intracardiac thrombus.    Antibiotics: Vancomycin 2/10 >>> Ceftriaxone 2/9 >>>2/11 Acyclovir 2/11 >>>2/11 Ancef- 2/11- 2/13  DVT prophylaxis: SCDs  Objective: Filed Weights   01/16/14 2224 01/17/14 2019 01/18/14 2015  Weight: 86.5 kg (190 lb 11.2 oz) 81.7 kg (180 lb 1.9 oz) 83.099 kg (183 lb 3.2 oz)   Blood pressure 136/57, pulse 110, temperature 98.9 F (37.2 C), temperature source Oral, resp. rate 18, height 5\' 2"  (1.575 m), weight 83.099 kg (183 lb 3.2 oz), SpO2 97.00%.  Intake/Output Summary (Last 24 hours) at 01/19/14 1237 Last data filed at 01/19/14 1213  Gross per 24 hour  Intake   1360 ml  Output    250 ml  Net   1110 ml     Exam: General: No  acute respiratory distress-alert Lungs: Clear to auscultation bilaterally without wheezes or crackles-  Cardiovascular: Regular rate and rhythm without murmur gallop or rub normal S1 and S2 Abdomen: Nontender, nondistended, soft, bowel sounds positive, no  rebound, no ascites, no appreciable mass Extremities: No significant cyanosis, clubbing, or edema bilateral lower extremities Neuro: left sided neglect, left facial droop- moving left side much better now- 4/5 strength  Data Reviewed: Basic Metabolic Panel:  Recent Labs Lab 01/12/14 1726 01/13/14 0223 01/14/14 0239 01/15/14 0442 01/16/14 0437 01/18/14 1036 01/19/14 0335  NA 123* 125* 127* 130* 134* 144 139  K 4.4 4.4 4.2 3.9 3.6* 3.4* 3.7  CL 82* 82* 86* 86* 88* 90* 88*  CO2 28 29 26  35* 37* >45* 44*  GLUCOSE 159* 174* 130* 157* 175* 225* 107*  BUN 23 27* 24* 21 27* 25* 27*  CREATININE 0.96 0.93 0.66 0.64 0.71 0.62 0.63  CALCIUM 9.5 9.4 9.4 9.8 10.2 11.3* 10.4  MG  --   --   --  2.0  --   --   --   PHOS  --  3.3  --  2.4  --   --   --    Liver Function Tests:  Recent Labs Lab 01/13/14 0223  ALBUMIN 3.0*   No results found for this basename: LIPASE, AMYLASE,  in the last 168 hours No results found for this basename: AMMONIA,  in the last 168 hours  CBC:  Recent Labs Lab 01/14/14 0239 01/15/14 0845 01/16/14 0437 01/18/14 1036 01/19/14 0335  WBC 24.9* 17.6* 14.0* 16.9* 14.4*  HGB 12.2 11.9* 12.5 13.8 12.6  HCT 35.7* 35.5* 38.5 43.0 38.7  MCV 87.5 89.2 92.1 94.9 93.3  PLT 290 257 281 325 PLATELET CLUMPS NOTED ON SMEAR, COUNT APPEARS ADEQUATE   Cardiac Enzymes: No results found for this basename: CKTOTAL, CKMB, CKMBINDEX, TROPONINI,  in the last 168 hours BNP (last 3 results) No results found for this basename: PROBNP,  in the last 8760 hours CBG:  Recent Labs Lab 01/18/14 1559 01/18/14 2009 01/18/14 2357 01/19/14 0432 01/19/14 0754  GLUCAP 146* 122* 145* 119* 105*    Recent Results (from the past 240 hour(s))  URINE CULTURE     Status: None   Collection Time    01/11/14  7:51 PM      Result Value Ref Range Status   Specimen Description URINE, CATHETERIZED   Final   Special Requests NONE   Final   Culture  Setup Time     Final   Value: 01/11/2014  21:34     Performed at [facility] Count     Final   Value: >=100,000 COLONIES/ML     Performed at [facility]   Culture     Final   Value: ESCHERICHIA COLI     Performed at [facility]   Report Status 01/13/2014 FINAL   Final   Organism ID, Bacteria ESCHERICHIA COLI   Final  CULTURE, BLOOD (ROUTINE X 2)     Status: None   Collection Time    01/12/14  6:00 AM      Result Value Ref Range Status   Specimen Description BLOOD RIGHT HAND   Final   Special Requests BOTTLES DRAWN AEROBIC ONLY 5CC   Final   Culture  Setup Time     Final   Value: 01/12/2014 10:21     Performed at [facility]   Culture     Final  Value: STAPHYLOCOCCUS SPECIES (COAGULASE NEGATIVE)     Note: SUSCEPTIBILITIES PERFORMED ON PREVIOUS CULTURE WITHIN THE LAST 5 DAYS.     Note: Gram Stain Report Called to,Read Back By and Verified With: Gray BernhardtENNE BROWN [id number] BRMEL     [facility]   Report Status 01/15/2014 FINAL   Final  CULTURE, BLOOD (ROUTINE X 2)     Status: None   Collection Time    01/12/14  6:06 AM      Result Value Ref Range Status   Specimen Description BLOOD RIGHT ANTECUBITAL   Final   Special Requests BOTTLES DRAWN AEROBIC ONLY 3CC   Final   Culture  Setup Time     Final   Value: 01/12/2014 10:21     [facility]   Culture     Final   Value: STAPHYLOCOCCUS SPECIES (COAGULASE NEGATIVE)     Note: RIFAMPIN AND GENTAMICIN SHOULD NOT BE USED AS SINGLE DRUGS FOR TREATMENT OF STAPH INFECTIONS.     Note: Gram Stain Report Called to,Read Back By and Verified With: Kerby MoorsENEE BROWN [id number] BRMEL     [facility]   Report Status 01/15/2014 FINAL   Final   Organism ID, Bacteria STAPHYLOCOCCUS SPECIES (COAGULASE NEGATIVE)   Final  MRSA PCR SCREENING     Status: None   Collection Time    01/12/14  6:35 AM      Result Value Ref Range Status   MRSA by PCR NEGATIVE  NEGATIVE Final   Comment:             The GeneXpert MRSA Assay (FDA     approved for NASAL specimens     only), is one component of a     comprehensive MRSA colonization     surveillance program. It is not     intended to diagnose MRSA     infection nor to guide or     monitor treatment for     MRSA infections.  CULTURE, BLOOD (ROUTINE X 2)     Status: None   Collection Time    01/18/14 12:20 PM      Result Value Ref Range Status   Specimen Description BLOOD RIGHT HAND   Final   Special Requests BOTTLES DRAWN AEROBIC ONLY 5CC   Final   Culture  Setup Time     Final   Value: 01/18/2014 16:16     [facility]formed [facility]   Culture     Final   Value:        BLOOD CULTURE RECEIVED NO GROWTH TO DATE CULTURE WILL BE HELD FOR 5 DAYS BEFORE ISSUING A FINAL NEGATIVE REPORT     [facility]   Report Status PENDING   Incomplete     Studies:  Recent x-ray studies have been reviewed in detail by the Attending Physician  Scheduled Meds:  Scheduled Meds: .  stroke: mapping our early stages of recovery book   Does not apply Once  . ALPRAZolam  0.5 mg Oral BID  . aspirin  300 mg Rectal Daily   Or  . aspirin  325 mg Oral Daily  . feeding supplement (PRO-STAT SUGAR FREE 64)  30 mL Per Tube Daily  . levothyroxine  50 mcg Per Tube QAC breakfast  . metoprolol tartrate  25 mg Per Tube BID  . pantoprazole sodium  40 mg Per Tube Daily  . vancomycin  1,500 mg Intravenous Q24H  Time spent on care of this patient: 35 min   Chalmers Cater [phone]  01/19/2014, 12:37 PM

## 2014-01-20 ENCOUNTER — Encounter (HOSPITAL_COMMUNITY): Payer: Self-pay | Admitting: Cardiology

## 2014-01-20 ENCOUNTER — Inpatient Hospital Stay (HOSPITAL_COMMUNITY): Payer: PRIVATE HEALTH INSURANCE | Admitting: Speech Pathology

## 2014-01-20 ENCOUNTER — Inpatient Hospital Stay (HOSPITAL_COMMUNITY): Payer: PRIVATE HEALTH INSURANCE

## 2014-01-20 ENCOUNTER — Inpatient Hospital Stay (HOSPITAL_COMMUNITY): Payer: PRIVATE HEALTH INSURANCE | Admitting: Rehabilitation

## 2014-01-20 ENCOUNTER — Inpatient Hospital Stay (HOSPITAL_COMMUNITY): Payer: PRIVATE HEALTH INSURANCE | Admitting: Physical Therapy

## 2014-01-20 DIAGNOSIS — A4902 Methicillin resistant Staphylococcus aureus infection, unspecified site: Secondary | ICD-10-CM

## 2014-01-20 DIAGNOSIS — I69991 Dysphagia following unspecified cerebrovascular disease: Secondary | ICD-10-CM

## 2014-01-20 DIAGNOSIS — I6330 Cerebral infarction due to thrombosis of unspecified cerebral artery: Secondary | ICD-10-CM

## 2014-01-20 DIAGNOSIS — R5381 Other malaise: Secondary | ICD-10-CM

## 2014-01-20 DIAGNOSIS — R7881 Bacteremia: Secondary | ICD-10-CM

## 2014-01-20 DIAGNOSIS — G9341 Metabolic encephalopathy: Secondary | ICD-10-CM

## 2014-01-20 LAB — BASIC METABOLIC PANEL
BUN: 25 mg/dL — AB (ref 6–23)
CO2: 39 mEq/L — ABNORMAL HIGH (ref 19–32)
Calcium: 9.7 mg/dL (ref 8.4–10.5)
Chloride: 91 mEq/L — ABNORMAL LOW (ref 96–112)
Creatinine, Ser: 0.64 mg/dL (ref 0.50–1.10)
GFR, EST NON AFRICAN AMERICAN: 78 mL/min — AB (ref >90)
Glucose, Bld: 113 mg/dL — ABNORMAL HIGH (ref 70–99)
POTASSIUM: 3.2 meq/L — AB (ref 3.7–5.3)
Sodium: 135 mEq/L — ABNORMAL LOW (ref 137–147)

## 2014-01-20 LAB — CBC WITH DIFFERENTIAL/PLATELET
Basophils Absolute: 0 10*3/uL (ref 0.0–0.1)
Basophils Relative: 0 % (ref 0–1)
Eosinophils Absolute: 0.5 10*3/uL (ref 0.0–0.7)
Eosinophils Relative: 5 % (ref 0–5)
HEMATOCRIT: 38.1 % (ref 36.0–46.0)
HEMOGLOBIN: 12.3 g/dL (ref 12.0–15.0)
LYMPHS ABS: 3 10*3/uL (ref 0.7–4.0)
Lymphocytes Relative: 27 % (ref 12–46)
MCH: 30.1 pg (ref 26.0–34.0)
MCHC: 32.3 g/dL (ref 30.0–36.0)
MCV: 93.2 fL (ref 78.0–100.0)
MONOS PCT: 10 % (ref 3–12)
Monocytes Absolute: 1.1 10*3/uL — ABNORMAL HIGH (ref 0.1–1.0)
NEUTROS ABS: 6.5 10*3/uL (ref 1.7–7.7)
Neutrophils Relative %: 59 % (ref 43–77)
Platelets: 274 10*3/uL (ref 150–400)
RBC: 4.09 MIL/uL (ref 3.87–5.11)
RDW: 14 % (ref 11.5–15.5)
WBC: 11.2 10*3/uL — ABNORMAL HIGH (ref 4.0–10.5)

## 2014-01-20 LAB — COMPREHENSIVE METABOLIC PANEL
ALT: 44 U/L — ABNORMAL HIGH (ref 0–35)
AST: 56 U/L — AB (ref 0–37)
Albumin: 2.9 g/dL — ABNORMAL LOW (ref 3.5–5.2)
Alkaline Phosphatase: 80 U/L (ref 39–117)
BILIRUBIN TOTAL: 0.7 mg/dL (ref 0.3–1.2)
BUN: 25 mg/dL — ABNORMAL HIGH (ref 6–23)
CHLORIDE: 89 meq/L — AB (ref 96–112)
CO2: 40 meq/L — AB (ref 19–32)
Calcium: 10.5 mg/dL (ref 8.4–10.5)
Creatinine, Ser: 0.62 mg/dL (ref 0.50–1.10)
GFR, EST NON AFRICAN AMERICAN: 79 mL/min — AB (ref >90)
GLUCOSE: 99 mg/dL (ref 70–99)
Potassium: 3.3 mEq/L — ABNORMAL LOW (ref 3.7–5.3)
Sodium: 137 mEq/L (ref 137–147)
Total Protein: 6.9 g/dL (ref 6.0–8.3)

## 2014-01-20 MED ORDER — SODIUM CHLORIDE 0.9 % IJ SOLN
10.0000 mL | Freq: Two times a day (BID) | INTRAMUSCULAR | Status: DC
  Administered 2014-01-26: 10 mL

## 2014-01-20 MED ORDER — POTASSIUM CHLORIDE CRYS ER 10 MEQ PO TBCR
10.0000 meq | EXTENDED_RELEASE_TABLET | Freq: Every day | ORAL | Status: DC
  Administered 2014-01-20: 10 meq via ORAL
  Filled 2014-01-20 (×3): qty 1

## 2014-01-20 MED ORDER — SODIUM CHLORIDE 0.9 % IV SOLN
Freq: Every day | INTRAVENOUS | Status: DC | PRN
  Administered 2014-01-21 – 2014-01-25 (×3): via INTRAVENOUS

## 2014-01-20 MED ORDER — SODIUM CHLORIDE 0.9 % IJ SOLN
10.0000 mL | INTRAMUSCULAR | Status: DC | PRN
  Administered 2014-01-20 – 2014-01-27 (×10): 10 mL
  Administered 2014-01-29: 20 mL

## 2014-01-20 MED ORDER — NAPHAZOLINE HCL 0.1 % OP SOLN
1.0000 [drp] | Freq: Three times a day (TID) | OPHTHALMIC | Status: DC
  Administered 2014-01-20 – 2014-01-29 (×30): 1 [drp] via OPHTHALMIC
  Filled 2014-01-20 (×2): qty 15

## 2014-01-20 NOTE — Interval H&P Note (Signed)
Tammy Fernandez was admitted yesterday to Inpatient Rehabilitation with the diagnosis of CVA/encephalopathy.  The patient's history has been reviewed, patient examined, and there is no change in status.  Patient continues to be appropriate for intensive inpatient rehabilitation.  I have reviewed the patient's chart and labs.  Questions were answered to the patient's satisfaction.  This encounter and document were completed on 01/19/14. The H&P is now being placed in the rehab encounter for the purpose of charting.   [name] T 01/20/2014, 8:00 AM

## 2014-01-20 NOTE — Progress Notes (Signed)
Social Work Patient ID: Tammy Fernandez, female   DOB: 10/13/1924, 80 y.o.   MRN: [id number] Met with pt, son and daughter to inform of team conference goals-supervision/min level and discharge 3/6.  Pt feels too long and son feels unsure if long Enough.  Aware will reassess and meet every Wed and watch pt's progress, so can move discharge date if needed.  Son reports: " Someone will be there with Her at home."  Will inform when need to begin family education, but also encouraged son to observe in therapies to see her progress as we go along in her stay.

## 2014-01-20 NOTE — H&P (View-Only) (Signed)
Physical Medicine and Rehabilitation Admission H&P    Chief Complaint  Patient presents with  . Code Stroke  :  Chief complaint; weakness  HPI: Tammy Fernandez is a 80 y.o. female history hypertension. Patient independent and active prior to admission. Admitted 01/11/2014 with altered mental status staring to the right not moving her left side per family report. Noted sodium level 115 and potassium 5.7. MRI of the brain 01/11/2014 showed no convincing evidence to suggest acute ischemia, however there was mild bright signal within the right temporal cortex. Echocardiogram with ejection fraction 82% grade 1 diastolic dysfunction without embolus. EEG was negative for seizure. Carotid Dopplers with no ICA stenosis. Placed on aspirin for CVA prophylaxis. A repeat MRI of the brain 01/14/2014 showed small acute lacunar infarct in the right parietal lobe(posterior right MCA territory) advised to continue aspirin. Patient placed on intravenous fluids of normal saline for hyponatremia. Renal service followup for hyponatremia question element of SIADH and workup ongoing. Urine culture greater than 100,000 gram-negative rods placed on Rocephin. Blood cultures x2 on admission of methicillin resistant coagulase-negative staph bacteremia with infectious disease consult to Dr. Megan Salon and Rocephin was changed to intravenous vancomycin x6 weeks. TEE completed 01/19/2014 showing ejection fraction 70% no evidence of endocarditis without thrombus . There was an atrial septal aneurysm with PFO. Venous Doppler studies lower extremities negative. Physical therapy evaluation completed 01/12/2014 with recommendations of physical medicine rehabilitation consult to consider inpatient rehabilitation services   ROS Review of Systems  Gastrointestinal:  GERD  Musculoskeletal: Positive for myalgias.  Neurological: Positive for dizziness.  Psychiatric/Behavioral:  Anxiety  All other systems reviewed and are  negative  Past Medical History  Diagnosis Date  . Hyperlipidemia   . Hypertension   . Anxiety   . GERD (gastroesophageal reflux disease)   . Abnormal heart rhythms   . Fatty liver   . Internal hemorrhoid    Past Surgical History  Procedure Laterality Date  . Cesarean section    . Abdominal hysterectomy     Family History  Problem Relation Age of Onset  . Stroke Father   . Hypertension Father   . Colon cancer Cousin   . Colon cancer      Aunt  . Diabetes      aunt and cousins  . Heart disease      cousins   Social History:  reports that she has never smoked. She has never used smokeless tobacco. She reports that she does not drink alcohol or use illicit drugs. Allergies:  Allergies  Allergen Reactions  . Crestor [Rosuvastatin Calcium]   . Sulfa Drugs Cross Reactors Rash   Medications Prior to Admission  Medication Sig Dispense Refill  . ALPRAZolam (XANAX) 0.5 MG tablet Take 0.5 mg by mouth at bedtime as needed for anxiety.      . Ascorbic Acid (VITAMIN C PO) Take 1 tablet by mouth daily.      Marland Kitchen aspirin EC 81 MG tablet Take 81 mg by mouth daily.      . Calcium & Magnesium Carbonates (MYLANTA PO) Take 5 mLs by mouth daily as needed (constipation).      . calcium carbonate (TUMS - DOSED IN MG ELEMENTAL CALCIUM) 500 MG chewable tablet Chew 1 tablet by mouth daily as needed for indigestion or heartburn.      . Cimetidine (TAGAMET PO) Take 1 capsule by mouth daily.      Marland Kitchen HAWTHORN BERRIES PO Take 1 tablet by mouth daily.      Marland Kitchen  levothyroxine (SYNTHROID, LEVOTHROID) 50 MCG tablet Take 50 mcg by mouth daily.       Marland Kitchen lisinopril (PRINIVIL,ZESTRIL) 10 MG tablet Take 5 mg by mouth daily.      . meclizine (ANTIVERT) 12.5 MG tablet Take 12.5 mg by mouth 3 (three) times daily as needed for nausea.      Marland Kitchen OVER THE COUNTER MEDICATION Place 1 drop into both eyes daily as needed (dry eyes). Eye drops      . promethazine (PHENERGAN) 25 MG tablet Take 25-50 mg by mouth 2 (two) times daily  as needed for nausea.      . propranolol ER (INDERAL LA) 60 MG 24 hr capsule Take 60 mg by mouth daily.      . Red Yeast Rice Extract (RED YEAST RICE PO) Take 1 tablet by mouth daily.      . Simethicone (GAS-X PO) Take 1 tablet by mouth daily as needed (gas).      . [DISCONTINUED] Simethicone (GAS-X PO) Take by mouth as needed.        Home: Home Living Family/patient expects to be discharged to:: Inpatient rehab Living Arrangements: Children Available Help at Discharge: Family Additional Comments: dtr and son live with her  Lives With: Family   Functional History: Prior Function Comments: family assisted with meal prep, as able to feed self  Functional Status:  Mobility: mod-max assist     Ambulation/Gait Ambulation Distance (Feet): 2 Feet General Gait Details: took 4-5 steps forward before turning to back to chair with assist for lateral weight shifts and mon cues for taking steps    ADL: ADL Eating/Feeding: Performed;Set up;Min guard Where Assessed - Eating/Feeding: Edge of bed Grooming: Performed;Set up;Min guard Where Assessed - Grooming: Supported sitting Upper Body Bathing: Simulated;Maximal assistance Where Assessed - Upper Body Bathing: Supported sitting Lower Body Bathing: Maximal assistance;+1 Total assistance Where Assessed - Lower Body Bathing: Supported sitting Upper Body Dressing: Simulated;+1 Total assistance Where Assessed - Upper Body Dressing: Supported sitting Lower Body Dressing: Simulated;+2 Total assistance Where Assessed - Lower Body Dressing: Supported sit to Lobbyist: Haematologist Method: Sit to Loss adjuster, chartered: Bedside commode Transfers/Ambulation Related to ADLs: cues for correct hand placement, sequencing ADL Comments: pt more alert with eyes open talkative. Cues for using L UE for assisting with ADLs pt attempting to use R UE when instructed to use L to assist. Pt no longer NPO and  sat EOB to simulate self feeding  Cognition: Cognition Overall Cognitive Status: Impaired/Different from baseline Arousal/Alertness: Awake/alert Orientation Level: Oriented to person;Oriented to place;Disoriented to time;Disoriented to situation Attention: Focused Focused Attention: Impaired Focused Attention Impairment: Verbal basic;Functional basic Memory: Impaired Memory Impairment: Storage deficit;Retrieval deficit Awareness: Impaired Awareness Impairment: Intellectual impairment Problem Solving: Impaired Problem Solving Impairment: Verbal basic;Functional basic Behaviors: Perseveration Safety/Judgment: Impaired Cognition Arousal/Alertness: Awake/alert Behavior During Therapy: Flat affect Overall Cognitive Status: Impaired/Different from baseline Area of Impairment: Orientation;Attention;Memory;Following commands;Safety/judgement;Awareness;Problem solving Orientation Level: Disoriented to;Place;Situation Current Attention Level: Sustained Memory: Decreased recall of precautions;Decreased short-term memory Following Commands: Follows one step commands inconsistently;Follows one step commands with increased time Safety/Judgement: Decreased awareness of deficits Awareness: Intellectual Problem Solving: Slow processing;Decreased initiation;Difficulty sequencing;Requires verbal cues;Requires tactile cues General Comments: pt able to answer more questions consistently; answers were appropriate ~50% of the time; pt was able to orient to year and self today      Blood pressure 182/72, pulse 79, temperature 98.6 F (37 C), temperature source Oral, resp. rate 16, height $RemoveBe'5\' 2"'swSReBekc$  (  1.575 m), weight 83.099 kg (183 lb 3.2 oz), SpO2 92.00%. Physical Exam Constitutional:  Morbidly obese, NAD Eyes:  Right gaze preference Neck: Normal range of motion. Neck supple. No thyromegaly present.  Cardiovascular: Normal rate and regular rhythm. No auscultated murmur Respiratory: Effort normal and  breath sounds normal. No respiratory distress. No wheezes rales or rhonchi GI: Soft. Bowel sounds are normal. She exhibits no distension.  Neurological:  Patient is more alert. Speech more intelligible. Improved basic insight and awareness.  Followed all basic commands. RUE 4+ deltoid, bicep, tricep, hand, LUE 3+ deltoid, 4-bicep, tricep, HI. RLE 3+ HF to 4+ at ankle. LLE is 3 to 3+ HF to 4/5 at then ankle with ADF and APF. Sensed PP on left side. Could not discern right to left difference in general LT or Pain sense. DTR's 1+.  Skin: Skin is warm and dry  Results for orders placed during the hospital encounter of 01/11/14 (from the past 48 hour(s))  GLUCOSE, CAPILLARY     Status: Abnormal   Collection Time    01/17/14 11:56 AM      Result Value Ref Range   Glucose-Capillary 183 (*) 70 - 99 mg/dL  GLUCOSE, CAPILLARY     Status: Abnormal   Collection Time    01/17/14  5:01 PM      Result Value Ref Range   Glucose-Capillary 187 (*) 70 - 99 mg/dL  GLUCOSE, CAPILLARY     Status: Abnormal   Collection Time    01/17/14  8:14 PM      Result Value Ref Range   Glucose-Capillary 224 (*) 70 - 99 mg/dL  GLUCOSE, CAPILLARY     Status: Abnormal   Collection Time    01/18/14 12:04 AM      Result Value Ref Range   Glucose-Capillary 223 (*) 70 - 99 mg/dL  GLUCOSE, CAPILLARY     Status: Abnormal   Collection Time    01/18/14  4:32 AM      Result Value Ref Range   Glucose-Capillary 230 (*) 70 - 99 mg/dL  GLUCOSE, CAPILLARY     Status: Abnormal   Collection Time    01/18/14  7:30 AM      Result Value Ref Range   Glucose-Capillary 162 (*) 70 - 99 mg/dL  CBC     Status: Abnormal   Collection Time    01/18/14 10:36 AM      Result Value Ref Range   WBC 16.9 (*) 4.0 - 10.5 K/uL   RBC 4.53  3.87 - 5.11 MIL/uL   Hemoglobin 13.8  12.0 - 15.0 g/dL   HCT 43.0  36.0 - 46.0 %   MCV 94.9  78.0 - 100.0 fL   MCH 30.5  26.0 - 34.0 pg   MCHC 32.1  30.0 - 36.0 g/dL   RDW 14.2  11.5 - 15.5 %   Platelets  325  150 - 400 K/uL  BASIC METABOLIC PANEL     Status: Abnormal   Collection Time    01/18/14 10:36 AM      Result Value Ref Range   Sodium 144  137 - 147 mEq/L   Potassium 3.4 (*) 3.7 - 5.3 mEq/L   Chloride 90 (*) 96 - 112 mEq/L   CO2 >45 (*) 19 - 32 mEq/L   Comment: CRITICAL RESULT CALLED TO, READ BACK BY AND VERIFIED WITH:     MCKNIGHT M RN 01/18/14 1151 COSTELLO B     REPEATED TO VERIFY   Glucose,  Bld 225 (*) 70 - 99 mg/dL   BUN 25 (*) 6 - 23 mg/dL   Creatinine, Ser 0.62  0.50 - 1.10 mg/dL   Calcium 11.3 (*) 8.4 - 10.5 mg/dL   GFR calc non Af Amer 79 (*) >90 mL/min   GFR calc Af Amer >90  >90 mL/min   Comment: (NOTE)     The eGFR has been calculated using the CKD EPI equation.     This calculation has not been validated in all clinical situations.     eGFR's persistently <90 mL/min signify possible Chronic Kidney     Disease.  CULTURE, BLOOD (ROUTINE X 2)     Status: None   Collection Time    01/18/14 12:20 PM      Result Value Ref Range   Specimen Description BLOOD RIGHT HAND     Special Requests BOTTLES DRAWN AEROBIC ONLY 5CC     Culture  Setup Time       Value: 01/18/2014 16:16     Performed at [facility]   Culture       Value:        BLOOD CULTURE RECEIVED NO GROWTH TO DATE CULTURE WILL BE HELD FOR 5 DAYS BEFORE ISSUING A FINAL NEGATIVE REPORT     Performed at [facility]   Report Status PENDING    BLOOD GAS, ARTERIAL     Status: Abnormal   Collection Time    01/18/14 12:50 PM      Result Value Ref Range   FIO2 0.21     pH, Arterial 7.487 (*) 7.350 - 7.450   pCO2 arterial 65.0 (*) 35.0 - 45.0 mmHg   Comment: CRITICAL RESULT CALLED TO, READ BACK BY AND VERIFIED WITH:     L.MCKNIGHT,RN @ 1259 BY D.ALLEN,RRT,RCP ON 01/18/14   pO2, Arterial 54.7 (*) 80.0 - 100.0 mmHg   Bicarbonate 48.7 (*) 20.0 - 24.0 mEq/L   TCO2 50.7  0 - 100 mmol/L   Acid-Base Excess 23.0 (*) 0.0 - 2.0 mmol/L   O2 Saturation 89.9     Patient temperature 98.6     Collection  site RIGHT RADIAL     Drawn by [id number]     Allens test (pass/fail) PASS  PASS  GLUCOSE, CAPILLARY     Status: Abnormal   Collection Time    01/18/14  3:59 PM      Result Value Ref Range   Glucose-Capillary 146 (*) 70 - 99 mg/dL  GLUCOSE, CAPILLARY     Status: Abnormal   Collection Time    01/18/14  8:09 PM      Result Value Ref Range   Glucose-Capillary 122 (*) 70 - 99 mg/dL  GLUCOSE, CAPILLARY     Status: Abnormal   Collection Time    01/18/14 11:57 PM      Result Value Ref Range   Glucose-Capillary 145 (*) 70 - 99 mg/dL  CBC     Status: Abnormal   Collection Time    01/19/14  3:35 AM      Result Value Ref Range   WBC 14.4 (*) 4.0 - 10.5 K/uL   Comment: WHITE COUNT CONFIRMED ON SMEAR   RBC 4.15  3.87 - 5.11 MIL/uL   Hemoglobin 12.6  12.0 - 15.0 g/dL   HCT 38.7  36.0 - 46.0 %   MCV 93.3  78.0 - 100.0 fL   MCH 30.4  26.0 - 34.0 pg   MCHC 32.6  30.0 - 36.0 g/dL  RDW 14.1  11.5 - 15.5 %   Platelets    150 - 400 K/uL   Value: PLATELET CLUMPS NOTED ON SMEAR, COUNT APPEARS ADEQUATE  BASIC METABOLIC PANEL     Status: Abnormal   Collection Time    01/19/14  3:35 AM      Result Value Ref Range   Sodium 139  137 - 147 mEq/L   Potassium 3.7  3.7 - 5.3 mEq/L   Chloride 88 (*) 96 - 112 mEq/L   CO2 44 (*) 19 - 32 mEq/L   Comment: CRITICAL RESULT CALLED TO, READ BACK BY AND VERIFIED WITH:     CASTRO,E RN 01/19/2014 0542 JORDANS     REPEATED TO VERIFY   Glucose, Bld 107 (*) 70 - 99 mg/dL   BUN 27 (*) 6 - 23 mg/dL   Creatinine, Ser 0.63  0.50 - 1.10 mg/dL   Calcium 10.4  8.4 - 10.5 mg/dL   GFR calc non Af Amer 78 (*) >90 mL/min   GFR calc Af Amer >90  >90 mL/min   Comment: (NOTE)     The eGFR has been calculated using the CKD EPI equation.     This calculation has not been validated in all clinical situations.     eGFR's persistently <90 mL/min signify possible Chronic Kidney     Disease.  GLUCOSE, CAPILLARY     Status: Abnormal   Collection Time    01/19/14  4:32 AM       Result Value Ref Range   Glucose-Capillary 119 (*) 70 - 99 mg/dL  GLUCOSE, CAPILLARY     Status: Abnormal   Collection Time    01/19/14  7:54 AM      Result Value Ref Range   Glucose-Capillary 105 (*) 70 - 99 mg/dL   No results found.  Post Admission Physician Evaluation: 1. Functional deficits secondary  to small right parietal infarct/ metabolic encephalopathy. 2. Patient is admitted to receive collaborative, interdisciplinary care between the physiatrist, rehab nursing staff, and therapy team. 3. Patient's level of medical complexity and substantial therapy needs in context of that medical necessity cannot be provided at a lesser intensity of care such as a SNF. 4. Patient has experienced substantial functional loss from his/her baseline which was documented above under the "Functional History" and "Functional Status" headings.  Judging by the patient's diagnosis, physical exam, and functional history, the patient has potential for functional progress which will result in measurable gains while on inpatient rehab.  These gains will be of substantial and practical use upon discharge  in facilitating mobility and self-care at the household level. 5. Physiatrist will provide 24 hour management of medical needs as well as oversight of the therapy plan/treatment and provide guidance as appropriate regarding the interaction of the two. 6. 24 hour rehab nursing will assist with bladder management, bowel management, safety, skin/wound care, disease management, medication administration, pain management and patient education  and help integrate therapy concepts, techniques,education, etc. 7. PT will assess and treat for/with: Lower extremity strength, range of motion, stamina, balance, functional mobility, safety, adaptive techniques and equipment, NMR, visual spatial and cognitive perceptual awareness.   Goals are: supervision to min assist. 8. OT will assess and treat for/with: ADL's, functional  mobility, safety, upper extremity strength, adaptive techniques and equipment, NMR, cognitive perceptual and visual spatial awareness.   Goals are: supervision to min assist. 9. SLP will assess and treat for/with: cognition, communication.  Goals are: supervision. 10. Case Management and Education officer, museum  will assess and treat for psychological issues and discharge planning. 11. Team conference will be held weekly to assess progress toward goals and to determine barriers to discharge. 12. Patient will receive at least 3 hours of therapy per day at least 5 days per week. 13. ELOS: 16-20 days       14. Prognosis:  excellent   Medical Problem List and Plan: 1. Small acute lacunar infarct/sepsis/metabolic encephalopathy 2. DVT Prophylaxis/Anticoagulation: SCDs. Venous Dopplers negative 3. Pain Management: Tylenol as needed. 4. Mood/anxiety. Xanax 0.5 mg each bedtime as needed. Provide emotional support 5. Neuropsych: This patient is not capable of making decisions on her own behalf. 6. ID. Methicillin resistant coagulase-negative staph bacteremia. Continue IV vancomycin x6 weeks 7. Dysphagia. Dysphagia 3 thin liquids. Followup speech therapy 8.Hyponatremia. Resolved with latest sodium 139/Follow up labs 9. Hypertension. Lopressor 25 mg twice a day. Monitor with increased mobility 10. Hypothyroidism. Synthroid. Latest TSH is 0.187  Meredith Staggers, MD, [facility]  01/19/2014

## 2014-01-20 NOTE — Evaluation (Signed)
Occupational Therapy Assessment and Plan  Patient Details  Name: Tammy Fernandez MRN: [id number] Date of Birth: May 01, 1924  OT Diagnosis: abnormal posture, apraxia, cognitive deficits and muscle weakness (generalized) Rehab Potential: Rehab Potential: Good ELOS: 15-25 days   Today's Date: 01/20/2014 Time: 1030-1130 Time Calculation (min): 60 min  Problem List:  Patient Active Problem List   Diagnosis Date Noted  . CVA (cerebral infarction) 01/19/2014  . Dysphagia, post-stroke 01/15/2014  . Bacteremia due to Staphylococcus 01/13/2014  . Acute respiratory failure with hypoxia 01/13/2014  . Acute encephalopathy 01/12/2014  . Left hemiplegia 01/12/2014  . Hypertension 01/11/2014  . Hyponatremia 01/11/2014  . Hyperkalemia 01/11/2014  . Hematuria 01/11/2014  . E. coli UTI (urinary tract infection) 01/11/2014  . GERD (gastroesophageal reflux disease) 11/14/2011  . Obesities, morbid 11/14/2011    Past Medical History:  Past Medical History  Diagnosis Date  . Hyperlipidemia   . Hypertension   . Anxiety   . GERD (gastroesophageal reflux disease)   . Abnormal heart rhythms   . Fatty liver   . Internal hemorrhoid    Past Surgical History:  Past Surgical History  Procedure Laterality Date  . Cesarean section    . Abdominal hysterectomy    . Tee without cardioversion N/A 01/19/2014    Procedure: TRANSESOPHAGEAL ECHOCARDIOGRAM (TEE);  Surgeon: Larey Dresser, MD;  Location: [facility] ENDOSCOPY;  Service: Cardiovascular;  Laterality: N/A;  [name]    Assessment & Plan Clinical Impression: Patient is a 80 y.o. year old female with history of hypertension. Patient independent and active prior to admission. Admitted 01/11/2014 with altered mental status staring to the right not moving her left side per family report. Noted sodium level 115 and potassium 5.7. MRI of the brain showed no convincing evidence to suggest acute ischemia with areas of patchy abnormality involving cortical as well  as basal ganglia regions significant bleeding unclear. Echocardiogram and EEG pending. Carotid Dopplers with no ICA stenosis. Patient placed on intravenous fluids of normal saline for hyponatremia. Renal service followup for hyponatremia question element of SIADH and workup ongoing. Urine culture greater than 100,000 gram-negative rods placed on Rocephin. Physical therapy evaluation completed 01/12/2014 with recommendations of physical medicine rehabilitation consult to consider inpatient rehabilitation services.    Patient transferred to CIR on 01/19/2014 .    Patient currently requires max with basic self-care skills secondary to muscle weakness, motor apraxia and decreased attention, decreased awareness, decreased problem solving, decreased safety awareness, decreased memory and delayed processing.  Prior to hospitalization, patient could complete BADL with independent .  Patient will benefit from skilled intervention to increase independence with basic self-care skills and increase level of independence with iADL prior to discharge home with care partner.  Anticipate patient will require intermittent supervision and follow up home health.  OT - End of Session Activity Tolerance: Tolerates 10 - 20 min activity with multiple rests Endurance Deficit: Yes OT Assessment Rehab Potential: Good OT Patient demonstrates impairments in the following area(s): Cognition;Balance;Endurance;Safety;Motor;Behavior OT Basic ADL's Functional Problem(s): Eating;Grooming;Bathing;Dressing;Toileting OT Advanced ADL's Functional Problem(s): Simple Meal Preparation OT Transfers Functional Problem(s): Toilet;Tub/Shower OT Plan OT Intensity: Minimum of 1-2 x/day, 45 to 90 minutes OT Frequency: 5 out of 7 days OT Duration/Estimated Length of Stay: 15-25 days OT Treatment/Interventions: Balance/vestibular training;Cognitive remediation/compensation;Discharge planning;DME/adaptive equipment instruction;Functional mobility  training;Patient/family education;Neuromuscular re-education;Therapeutic Exercise;Therapeutic Activities;UE/LE Coordination activities;UE/LE Strength taining/ROM OT Self Feeding Anticipated Outcome(s): Supervision OT Basic Self-Care Anticipated Outcome(s): Supervision OT Toileting Anticipated Outcome(s): Supervision OT Bathroom Transfers Anticipated Outcome(s): Min A OT  Recommendation Patient destination: Home Follow Up Recommendations: 24 hour supervision/assistance;Home health OT Equipment Recommended: 3 in 1 bedside comode   Skilled Therapeutic Intervention 1:1 OT initial evaluation completed with family members present throughout session (daughters [name] and [name], son [name], and granddaughter [name]).   Patient was initially receptive to bathing at sink but became distracted by need to void urine.   When assisted to toilet, patient refused assisted transfer d/t anxiety and requested return to bed.   Patient was able to complete sit <> stand and transfer to bed with max assist, although contributing to transfer and pivot (pt = 75%) after assist with initiation.   Patient required frequent redirection throughout session d/t poor immediate recall, often requesting unnecessary assist with movements but was able to move all extremities, shift weight, and respond to cues given extra time and repeated instruction.   Patient became mildly argumentative during session but accepted redirection as intended to restore her independence with additional reinforcement provided from her very supportive family.  OT Evaluation Precautions/Restrictions  Precautions Precautions: Fall  General Chart Reviewed: Yes Family/Caregiver Present: Yes  Vital Signs Therapy Vitals Pulse Rate: 119 BP: 173/83 mmHg Patient Position, if appropriate: Standing Oxygen Therapy SpO2: 100 % O2 Device: Nasal cannula O2 Flow Rate (L/min): 2 L/min Pulse Oximetry Type: Intermittent  Pain Pain Assessment Pain  Assessment: No/denies pain PAINAD (Pain Assessment in Advanced Dementia) Breathing: normal Negative Vocalization: none Facial Expression: smiling or inexpressive Body Language: tense, distressed pacing, fidgeting Consolability: distracted or reassured by voice/touch PAINAD Score: 2  Home Living/Prior Functioning Home Living Family/patient expects to be discharged to:: Private residence Living Arrangements: Children Available Help at Discharge: Family;Available 24 hours/day Type of Home: House Home Access: Stairs to enter [employer] of Steps: 3  to porch + threshold Entrance Stairs-Rails: Left Home Layout: One level Additional Comments: Daughter, son and niece live with her but son works intermittently  Lives With: Engineer, site IADL History Homemaking Responsibilities: Yes Meal Prep Responsibility: Building surveyor Responsibility: No Cleaning Responsibility: No Bill Paying/Finance Responsibility: Primary Shopping Responsibility: Primary Child Care Responsibility: No Current License: No Education: 5 th grade 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,  Leisure and Hobbies: Preach, sing Prior Function Level of Independence: Independent with gait;Independent with transfers;Other (comment);Independent with basic ADLs;Needs assistance with homemaking Laundry: Total Vacuuming: Total Light Housekeeping: Moderate  Able to Take Stairs?: Yes Driving: No Vocation: Retired  ADL ADL ADL Comments: see FIM, document flowsheet  Vision/Perception  Vision - History Baseline Vision: Wears glasses all the time Patient Visual Report: Diplopia;Unable to keep objects in focus Vision - Assessment Vision Assessment: Vision tested Alignment/Gaze Preference: Head turned;Head tilt;Gaze right Tracking/Visual Pursuits: Decreased smoothness of horizontal tracking;Decreased smoothness of vertical tracking;Unable to hold eye position out of midline;Requires  cues, head turns, or add eye shifts to track Saccades: Additional eye shifts occurred during testing;Additional head turns occurred during testing;Decreased speed of saccadic movement Convergence: Within functional limits Perception Perception: Impaired Inattention/Neglect: Does not attend to left visual field;Does not attend to left side of body Praxis Praxis: Impaired Praxis Impairment Details: Motor planning   Cognition Overall Cognitive Status: Impaired/Different from baseline Arousal/Alertness: Awake/alert Orientation Level: Oriented to person;Oriented to place;Oriented to situation;Disoriented to time Attention: Focused;Sustained Focused Attention: Impaired Focused Attention Impairment: Verbal basic;Functional basic Sustained Attention: Impaired Sustained Attention Impairment: Verbal basic;Functional basic Memory: Impaired Memory Impairment: Storage deficit;Retrieval deficit;Decreased recall of new information Awareness: Impaired Awareness Impairment: Intellectual impairment Problem Solving: Impaired Problem Solving Impairment: Verbal basic;Functional basic Executive Function:  Reasoning;Organizing;Decision Making;Sequencing Reasoning: Impaired Reasoning Impairment: Verbal complex;Functional basic Sequencing: Impaired Sequencing Impairment: Functional basic Organizing: Impaired Organizing Impairment: Functional basic;Verbal complex Decision Making: Impaired Decision Making Impairment: Verbal complex;Functional basic Behaviors: Perseveration Safety/Judgment: Impaired  Sensation Sensation Light Touch: Impaired by gross assessment (L-LE) Stereognosis: Not tested Hot/Cold: Not tested Proprioception: Impaired by gross assessment Coordination Gross Motor Movements are Fluid and Coordinated: Yes Fine Motor Movements are Fluid and Coordinated: No Coordination and Movement Description: mild tremors, bil hands, during functional tasks  Motor  Motor Motor: Motor  apraxia;Abnormal postural alignment and control Motor - Skilled Clinical Observations: No significant weakness but impaired attention to L body; motor planning impairments and presents with R lateral and postierior lean, R shoulder elevation   Mobility  Bed Mobility Bed Mobility: Supine to Sit;Sit to Supine Supine to Sit: HOB flat;1: +1 Total assist Supine to Sit Details (indicate cue type and reason): Pt unable to motor plan supine > sit from flat bed despite normal strength bilaterally; required total verbal cues and assistance to roll to R side and sit EOB Transfers Transfers: Sit to Stand Sit to Stand: 2: Max assist Sit to Stand Details: Manual facilitation for weight bearing;Verbal cues for technique   Trunk/Postural Assessment  Cervical Assessment Cervical Assessment: Exceptions to [facility] (right lateral flexion and rotation) Thoracic Assessment Thoracic Assessment: Exceptions to [facility] (right trunk shortening) Lumbar Assessment Lumbar Assessment: Within Functional Limits Postural Control Postural Control: Deficits on evaluation Trunk Control: sits with right lateral and posterior LOB   Balance Static Sitting Balance Static Sitting - Balance Support: Left upper extremity supported;Right upper extremity supported Static Sitting - Level of Assistance: 5: Stand by assistance Dynamic Sitting Balance Dynamic Sitting - Balance Support: Right upper extremity supported;Left upper extremity supported Dynamic Sitting - Level of Assistance: 4: Min assist Static Standing Balance Static Standing - Balance Support: Right upper extremity supported;Left upper extremity supported Static Standing - Level of Assistance: 2: Max assist Dynamic Standing Balance Dynamic Standing - Balance Support: Right upper extremity supported;Left upper extremity supported Dynamic Standing - Level of Assistance: 2: Max assist  Extremity/Trunk Assessment RUE Assessment RUE Assessment: Exceptions to [facility] RUE  Strength RUE Overall Strength: Deficits (3-/5) LUE Assessment LUE Assessment: Exceptions to [facility] LUE Strength LUE Overall Strength: Deficits LUE Overall Strength Comments: 3-/5  FIM:  FIM - Toileting Toileting: 1: Two helpers FIM - IT sales professional Transfer: 1: Supine > Sit: Total A (helper does all/Pt. < 25%);1: Sit > Supine: Total A (helper does all/Pt. < 25%);2: Bed > Chair or W/C: Max A (lift and lower assist);2: Chair or W/C > Bed: Max A (lift and lower assist) FIM - Radio producer Devices: Recruitment consultant Transfers: 1-Two helpers   Refer to Care Plan for Long Term Goals  Recommendations for other services: None  Discharge Criteria: Patient will be discharged from OT if patient refuses treatment 3 consecutive times without medical reason, if treatment goals not met, if there is a change in medical status, if patient makes no progress towards goals or if patient is discharged from hospital.  The above assessment, treatment plan, treatment alternatives and goals were discussed and mutually agreed upon: by patient and by family  [facility] 01/20/2014, 12:22 PM

## 2014-01-20 NOTE — Progress Notes (Signed)
Social Work Assessment and Plan Social Work Assessment and Plan  Patient Details  Name: Tammy Fernandez MRN: [id number] Date of Birth: 27-Jul-1924  Today's Date: 01/20/2014  Problem List:  Patient Active Problem List   Diagnosis Date Noted  . CVA (cerebral infarction) 01/19/2014  . Dysphagia, post-stroke 01/15/2014  . Bacteremia due to Staphylococcus 01/13/2014  . Acute respiratory failure with hypoxia 01/13/2014  . Acute encephalopathy 01/12/2014  . Left hemiplegia 01/12/2014  . Hypertension 01/11/2014  . Hyponatremia 01/11/2014  . Hyperkalemia 01/11/2014  . Hematuria 01/11/2014  . E. coli UTI (urinary tract infection) 01/11/2014  . GERD (gastroesophageal reflux disease) 11/14/2011  . Obesities, morbid 11/14/2011   Past Medical History:  Past Medical History  Diagnosis Date  . Hyperlipidemia   . Hypertension   . Anxiety   . GERD (gastroesophageal reflux disease)   . Abnormal heart rhythms   . Fatty liver   . Internal hemorrhoid    Past Surgical History:  Past Surgical History  Procedure Laterality Date  . Cesarean section    . Abdominal hysterectomy    . Tee without cardioversion N/A 01/19/2014    Procedure: TRANSESOPHAGEAL ECHOCARDIOGRAM (TEE);  Surgeon: Laurey Moralealton S McLean, MD;  [facility];  Service: Cardiovascular;  Laterality: N/A;  [name]/ja   Social History:  reports that she has never smoked. She has never used smokeless tobacco. She reports that she does not drink alcohol or use illicit drugs.  Family / Support Systems Marital Status: Widow/Widower Patient Roles: Parent;Other (Comment) (Friend) ChildrenEllwood Dense: Jerome-son  [phone]-cell    [name] Olubodun-daughter  [phone]-home   Other Supports: Julious OkaCarolyn Davis-daughter  [phone]-home  [facility]-daughter  [phone]-home  [phone] Anticipated Caregiver: Several family members will piece together Ability/Limitations of Caregiver: Will plan to have someone there with her, aware of the need for 24 hr  care Caregiver Availability: 24/7 Family Dynamics: Close knit family who are willing to do for one another.  Pt has always been independent and helping others.  They are not used to her being the one that needs help.  All will pull together to assist her when she comes home.  Social History Preferred language: English Religion: Holiness Cultural Background: No issues Education: High School Read: Yes Write: Yes Employment Status: Retired Fish farm managerLegal Hisotry/Current Legal Issues: No issues Guardian/Conservator: None-according to MD pt is not capable of making her own decisions while here.  Son reports he is the spoke's person of the family.  Will make sure all of the children are involved since there s not one designated or formal POA   Abuse/Neglect Physical Abuse: Denies Verbal Abuse: Denies Sexual Abuse: Denies Exploitation of patient/patient's resources: Denies Self-Neglect: Denies  Emotional Status Pt's affect, behavior adn adjustment status: Pt is motivated to improve and regain her independence she is one that has always taken care of herself and others.  She feels her length of stay is too long and she wants to go home sooner.  Will see if she progresses quicker and can move up discharge date.  Son was trying to calm Mom and remind her she just got to rehab. Recent Psychosocial Issues: Other medical issues-remained at a independent level Pyschiatric History: History of anxiety takes meds for and finds them helpful.  Deferred depression screen due to pt's confusion, will see if clears and will re-assess at a later time.  Will ask team to monitor along with worker regarding depression and intervene.  May be a good Neuro-psych testing candidate later in her stay. Substance Abuse  History: No issues  Patient / Family Perceptions, Expectations & Goals Pt/Family understanding of illness & functional limitations: Pt and son have a basic understanding of her condition.  Son and daughter have a  better understanding, pt feels will be ready to return home soon and is clear.  Son reports she is getting better each day and is hopeful this will continue. Premorbid pt/family roles/activities: Mother, Grandmother, Retiree, Church member, Chief Financial Officer, etc Anticipated changes in roles/activities/participation: resume at discharge Pt/family expectations/goals: Pt states: " I am ready to go home soon, I can do this at home."  Son states; " I hope she will continue to clear and get better, will she be here long enough."  Assured him will follow along and conitnue to monitor her progress toward her goals and meet again next week fot team conference.  Community Resources [employer]: None Premorbid Home Care/DME Agencies: None Transportation available at discharge: [employer] referrals recommended: Support group (specify) ([facility] Support group)  Discharge Planning Living Arrangements: Children Support Systems: Children;Other relatives;Friends/neighbors;Church/faith community Type of Residence: Private residence Insurance Resources: Medicaid (specify county);Media planner (specify) ([place] Financial Resources: Restaurant manager, fast food Screen Referred: No Living Expenses: Lives with family Money Management: Patient;Family Does the patient have any problems obtaining your medications?: No Home Management: Family Patient/Family Preliminary Plans: Return home with her children and family providing care.  Son reports: " Someone is always there and will be for her."  Infomred son and daughter will ask them to be involved in her therapies and do education prior to discharge from here.  He wants to be informed when this is needed and he will have soneone here for her. Social Work Anticipated Follow Up Needs: HH/OP;Support Group  Clinical Impression Confused but very talkative and wants to direct her care and be the spokesperson.  Son is very understanding of his Mom and  allows her as much freedom as he can. He seems to have a good understanding her condition and aware of her need for 24 hr care at discharge.  Encouraged him to arrange with his family the plan, so when  Family education needed will know who needs to come in.  Lucy Chris 01/20/2014, 3:49 PM

## 2014-01-20 NOTE — Evaluation (Signed)
Physical Therapy Assessment and Plan  Patient Details  Name: Tammy Fernandez MRN: [id number] Date of Birth: May 21, 1924  PT Diagnosis: Abnormal posture, Abnormality of gait, Cognitive deficits, Difficulty walking, Dizziness and giddiness and Muscle weakness Rehab Potential: Good ELOS: 14-18 days   Today's Date: 01/20/2014 Time: 8676-1950 Time Calculation (min): 72 min  Problem List:  Patient Active Problem List   Diagnosis Date Noted  . CVA (cerebral infarction) 01/19/2014  . Dysphagia, post-stroke 01/15/2014  . Bacteremia due to Staphylococcus 01/13/2014  . Acute respiratory failure with hypoxia 01/13/2014  . Acute encephalopathy 01/12/2014  . Left hemiplegia 01/12/2014  . Hypertension 01/11/2014  . Hyponatremia 01/11/2014  . Hyperkalemia 01/11/2014  . Hematuria 01/11/2014  . E. coli UTI (urinary tract infection) 01/11/2014  . GERD (gastroesophageal reflux disease) 11/14/2011  . Obesities, morbid 11/14/2011    Past Medical History:  Past Medical History  Diagnosis Date  . Hyperlipidemia   . Hypertension   . Anxiety   . GERD (gastroesophageal reflux disease)   . Abnormal heart rhythms   . Fatty liver   . Internal hemorrhoid    Past Surgical History:  Past Surgical History  Procedure Laterality Date  . Cesarean section    . Abdominal hysterectomy    . Tee without cardioversion N/A 01/19/2014    Procedure: TRANSESOPHAGEAL ECHOCARDIOGRAM (TEE);  Surgeon: Larey Dresser, MD;  Location: [facility] ENDOSCOPY;  Service: Cardiovascular;  Laterality: N/A;  [name]    Assessment & Plan Clinical Impression: Patient is a 80 y.o. female history hypertension. Patient independent and active prior to admission. Admitted 01/11/2014 with altered mental status staring to the right not moving her left side per family report. Noted sodium level 115 and potassium 5.7. MRI of the brain 01/11/2014 showed no convincing evidence to suggest acute ischemia, however there was mild bright signal within  the right temporal cortex. Echocardiogram with ejection fraction 93% grade 1 diastolic dysfunction without embolus. EEG was negative for seizure. Carotid Dopplers with no ICA stenosis. Placed on aspirin for CVA prophylaxis. A repeat MRI of the brain 01/14/2014 showed small acute lacunar infarct in the right parietal lobe(posterior right MCA territory) advised to continue aspirin. Patient placed on intravenous fluids of normal saline for hyponatremia. Renal service followup for hyponatremia question element of SIADH and workup ongoing. Urine culture greater than 100,000 gram-negative rods placed on Rocephin. Blood cultures x2 on admission of methicillin resistant coagulase-negative staph bacteremia with infectious disease consult to Dr. Megan Salon and Rocephin was changed to intravenous vancomycin x6 weeks. TEE completed 01/19/2014 showing ejection fraction 70% no evidence of endocarditis without thrombus . There was an atrial septal aneurysm with PFO. Venous Doppler studies lower extremities negative.  Patient transferred to CIR on 01/19/2014 .   Patient currently requires max with mobility secondary to muscle weakness, decreased cardiorespiratoy endurance, motor apraxia and decreased motor planning, decreased visual perceptual skills and decreased visual motor skills, decreased attention to left, decreased attention, decreased awareness, decreased problem solving, decreased safety awareness and decreased memory, c/o dizziness and decreased sitting balance, decreased standing balance, decreased postural control and decreased balance strategies.  Prior to hospitalization, patient was independent  with mobility and lived with Family;Son;Daughter (Niece) in a House home.  Home access is 4 to porch + 1 into houseStairs to enter.  Patient will benefit from skilled PT intervention to maximize safe functional mobility, minimize fall risk and decrease caregiver burden for planned discharge home with 24 hour supervision.   Anticipate patient will benefit from follow up [facility]  at discharge.   Skilled Therapeutic Intervention   PT Evaluation Precautions/Restrictions Precautions Precautions: Fall General Chart Reviewed: Yes Response to Previous Treatment: Not applicable Family/Caregiver Present: Yes (daughter Tammy Fernandez)  Vital SignsTherapy Vitals Pulse Rate: 119 BP: 173/83 mmHg Patient Position, if appropriate: Standing Oxygen Therapy SpO2: 100 % O2 Device: Nasal cannula O2 Flow Rate (L/min): 2 L/min Pulse Oximetry Type: Intermittent, 83% Sp02 on RA Pain Pain Assessment Pain Score: 3  PAINAD (Pain Assessment in Advanced Dementia) Breathing: normal Negative Vocalization: none Facial Expression: smiling or inexpressive Body Language: tense, distressed pacing, fidgeting Consolability: distracted or reassured by voice/touch PAINAD Score: 2 Home Living/Prior Functioning Home Living Available Help at Discharge: Family;Available 24 hours/day Type of Home: House Home Access: Stairs to enter [employer] of Steps: 4 to porch + 1 into house Entrance Stairs-Rails: Left Home Layout: One level Additional Comments: Daughter, son and niece live with her but son works intermittently  Lives With: Engineer, site (Niece) Prior Function Level of Independence: Independent with homemaking with ambulation;Independent with gait;Independent with transfers;Other (comment) (required assistance for stairs)  Able to Take Stairs?: Yes Driving: No Vocation: Retired Radiographer, therapeutic - History Baseline Vision: Wears glasses all the time Patient Visual Report: Diplopia;Unable to keep objects in focus (daughter reports pt c/o double vision) Vision - Assessment Vision Assessment: Vision tested Alignment/Gaze Preference: Head turned;Head tilt;Gaze right Tracking/Visual Pursuits: Decreased smoothness of horizontal tracking;Decreased smoothness of vertical tracking;Unable to hold eye position out of  midline;Requires cues, head turns, or add eye shifts to track Saccades: Additional eye shifts occurred during testing;Additional head turns occurred during testing;Decreased speed of saccadic movement Convergence: Within functional limits Perception Perception: Impaired Inattention/Neglect: Does not attend to left visual field;Does not attend to left side of body Praxis Praxis: Impaired Praxis Impairment Details: Motor planning  Cognition Overall Cognitive Status: Impaired/Different from baseline Arousal/Alertness: Awake/alert Orientation Level: Oriented to person;Oriented to place;Disoriented to time;Disoriented to situation Attention: Focused;Sustained Focused Attention: Impaired Focused Attention Impairment: Verbal basic;Functional basic Sustained Attention: Impaired Sustained Attention Impairment: Verbal basic;Functional basic Memory: Impaired Memory Impairment: Storage deficit;Retrieval deficit;Decreased recall of new information Awareness: Impaired Awareness Impairment: Intellectual impairment Problem Solving: Impaired Problem Solving Impairment: Verbal basic;Functional basic Behaviors: Perseveration;Other (comment) (significant anxiety) Safety/Judgment: Impaired Sensation Sensation Light Touch: Impaired by gross assessment Stereognosis: Not tested Hot/Cold: Not tested Proprioception: Impaired by gross assessment Coordination Gross Motor Movements are Fluid and Coordinated: Yes Motor  Motor Motor: Motor apraxia;Abnormal postural alignment and control Motor - Skilled Clinical Observations: No significant weakness but impaired attention to L body; motor planning impairments and presents with R lateral and postierior lean, R shoulder elevation   Mobility Bed Mobility Bed Mobility: Supine to Sit;Sit to Supine Supine to Sit: HOB flat;1: +1 Total assist Supine to Sit Details (indicate cue type and reason): Pt unable to motor plan supine > sit from flat bed despite normal  strength bilaterally; required total verbal cues and assistance to roll to R side and sit EOB Transfers Transfers: Yes Stand Pivot Transfers: With armrests;2: Max assist Stand Pivot Transfer Details (indicate cue type and reason): Performed multiple stand pivots bed > BSC > W/c <> mat with HHA with lifting assistance to stand fully erect and total verbal cues needed to sequencing pivoting and weight shifting; assistance also needed for safe, controlled stand > sit; unable to have BM while on toilet despite feeling like she needs to.   Locomotion  Ambulation Ambulation/Gait Assistance: 1: +2 Total assist Ambulation Distance (Feet): 30 Feet Ambulation/Gait Assistance Details: Pt required +2 HHA  for gait x 30' in controlled environment; pt reporting significant dizziness (feeling off balance) during gait but no signs of orthostasis Gait Gait: Yes Gait Pattern: Impaired Gait Pattern: Step-through pattern;Decreased step length - right;Decreased step length - left;Decreased stride length;Lateral trunk lean to right;Decreased trunk rotation;Trunk flexed Stairs / Additional Locomotion Stairs: No Wheelchair Mobility Wheelchair Mobility: No Distance: 150 total A  Trunk/Postural Assessment  Cervical Assessment Cervical Assessment: Exceptions to WFL (chin down, R lateral flexion and rotation) Thoracic Assessment Thoracic Assessment: Exceptions to [facility] (R trunk shortening, L trunk flared) Lumbar Assessment Lumbar Assessment: Within Functional Limits Postural Control Postural Control: Deficits on evaluation (sits with R lateral and posterior LOB)  Balance Static Sitting Balance Static Sitting - Balance Support: Left upper extremity supported;Right upper extremity supported Static Sitting - Level of Assistance: 5: Stand by assistance Dynamic Sitting Balance Dynamic Sitting - Balance Support: Right upper extremity supported;Left upper extremity supported Dynamic Sitting - Level of Assistance: 4: Min  assist Static Standing Balance Static Standing - Balance Support: Right upper extremity supported;Left upper extremity supported Static Standing - Level of Assistance: 2: Max assist Dynamic Standing Balance Dynamic Standing - Balance Support: Right upper extremity supported;Left upper extremity supported Dynamic Standing - Level of Assistance: 2: Max assist Extremity Assessment  RLE Assessment RLE Assessment: Within Functional Limits LLE Assessment LLE Assessment: Within Functional Limits  FIM:  FIM - Bed/Chair Transfer Bed/Chair Transfer: 1: Supine > Sit: Total A (helper does all/Pt. < 25%);1: Sit > Supine: Total A (helper does all/Pt. < 25%);2: Bed > Chair or W/C: Max A (lift and lower assist);2: Chair or W/C > Bed: Max A (lift and lower assist) FIM - Locomotion: Wheelchair Distance: 150 total A Locomotion: Wheelchair: 1: Total Assistance/staff pushes wheelchair (Pt<25%) FIM - Locomotion: Ambulation Locomotion: Ambulation Assistive Devices: Other (comment) (+2 HHA) Ambulation/Gait Assistance: 1: +2 Total assist Locomotion: Ambulation: 1: Two helpers FIM - Locomotion: Stairs Locomotion: Stairs: 0: Activity did not occur   Refer to Care Plan for Long Term Goals  Recommendations for other services: Neuropsych and Other: Vestibular evaluation  Discharge Criteria: Patient will be discharged from PT if patient refuses treatment 3 consecutive times without medical reason, if treatment goals not met, if there is a change in medical status, if patient makes no progress towards goals or if patient is discharged from hospital.  The above assessment, treatment plan, treatment alternatives and goals were discussed and mutually agreed upon: by patient and by family  Malachy Mood 01/20/2014, 10:35 AM

## 2014-01-20 NOTE — Plan of Care (Signed)
Problem: RH SKIN INTEGRITY Goal: RH STG MAINTAIN SKIN INTEGRITY WITH ASSISTANCE STG Maintain Skin Integrity With minimal Assistance.  Outcome: Not Progressing Requiring staff to change allevyn dressing on sacrum, prn

## 2014-01-20 NOTE — Progress Notes (Signed)
Subjective/Complaints: 80 y.o. female history hypertension. Patient independent and active prior to admission. Admitted 01/11/2014 with altered mental status staring to the right not moving her left side per family report. Noted sodium level 115 and potassium 5.7. MRI of the brain 01/11/2014 showed no convincing evidence to suggest acute ischemia, however there was mild bright signal within the right temporal cortex. Echocardiogram with ejection fraction 93% grade 1 diastolic dysfunction without embolus. EEG was negative for seizure. Carotid Dopplers with no ICA stenosis. Placed on aspirin for CVA prophylaxis. A repeat MRI of the brain 01/14/2014 showed small acute lacunar infarct in the right parietal lobe(posterior right MCA territory) advised to continue aspirin. Patient placed on intravenous fluids of normal saline for hyponatremia. Renal service followup for hyponatremia question element of SIADH and workup ongoing. Urine culture greater than 100,000 gram-negative rods placed on Rocephin. Blood cultures x2 on admission of methicillin resistant coagulase-negative staph bacteremia with infectious disease consult to Dr. Megan Salon and Rocephin was changed to intravenous vancomycin x6 weeks. TEE completed 01/19/2014 showing ejection fraction 70% no evidence of endocarditis without thrombus . There was an atrial septal aneurysm with PFO. Venous Doppler studies lower extremities negative   Objective: Vital Signs: Blood pressure 150/78, pulse 86, temperature 97.6 F (36.4 C), temperature source Oral, resp. rate 18, weight 82.9 kg (182 lb 12.2 oz), SpO2 100.00%. Ir Fluoro Guide Cv Line Right  01/19/2014   CLINICAL DATA:  Sepsis  EXAM: Right upper extremity PICC LINE PLACEMENT WITH ULTRASOUND AND FLUOROSCOPIC GUIDANCE  FLUOROSCOPY TIME:  6 seconds.  PROCEDURE: The patient was advised of the possible risks andcomplications and agreed to undergo the procedure. The patient was then brought to the angiographic suite  for the procedure.  The right arm was prepped with chlorhexidine, drapedin the usual sterile fashion using maximum barrier technique (cap and mask, sterile gown, sterile gloves, large sterile sheet, hand hygiene and cutaneous antisepsis) and infiltrated locally with 1% Lidocaine.  Ultrasound demonstrated patency of the right cephalic vein, and this was documented with an image. Under real-time ultrasound guidance, this vein was accessed with a 21 gauge micropuncture needle and image documentation was performed. A 0.018 wire was introduced in to the vein. Over this, a 5 [place] double lumen Power PICC, trimmed to 37 cm, was advanced to the lower SVC/right atrial junction. Fluoroscopy during the procedure and fluoro spot radiograph confirms appropriate catheter position. The catheter was flushed and covered with asterile dressing.  Complications: None  IMPRESSION: Successful right arm Power PICC line placement with ultrasound and fluoroscopic guidance. The catheter is ready for use.   Electronically Signed   By: Maryclare Bean M.D.   On: 01/19/2014 14:28   Ir US Guide Vasc Access Right  01/19/2014   CLINICAL DATA:  Sepsis  EXAM: Right upper extremity PICC LINE PLACEMENT WITH ULTRASOUND AND FLUOROSCOPIC GUIDANCE  FLUOROSCOPY TIME:  6 seconds.  PROCEDURE: The patient was advised of the possible risks andcomplications and agreed to undergo the procedure. The patient was then brought to the angiographic suite for the procedure.  The right arm was prepped with chlorhexidine, drapedin the usual sterile fashion using maximum barrier technique (cap and mask, sterile gown, sterile gloves, large sterile sheet, hand hygiene and cutaneous antisepsis) and infiltrated locally with 1% Lidocaine.  Ultrasound demonstrated patency of the right cephalic vein, and this was documented with an image. Under real-time ultrasound guidance, this vein was accessed with a 21 gauge micropuncture needle and image documentation was performed. A 0.018  wire was introduced  in to the vein. Over this, a 5 [place] double lumen Power PICC, trimmed to 37 cm, was advanced to the lower SVC/right atrial junction. Fluoroscopy during the procedure and fluoro spot radiograph confirms appropriate catheter position. The catheter was flushed and covered with asterile dressing.  Complications: None  IMPRESSION: Successful right arm Power PICC line placement with ultrasound and fluoroscopic guidance. The catheter is ready for use.   Electronically Signed   By: Maryclare Bean M.D.   On: 01/19/2014 14:28   Results for orders placed during the hospital encounter of 01/19/14 (from the past 72 hour(s))  CBC WITH DIFFERENTIAL     Status: Abnormal   Collection Time    01/20/14  6:40 AM      Result Value Ref Range   WBC 11.2 (*) 4.0 - 10.5 K/uL   RBC 4.09  3.87 - 5.11 MIL/uL   Hemoglobin 12.3  12.0 - 15.0 g/dL   HCT 38.1  36.0 - 46.0 %   MCV 93.2  78.0 - 100.0 fL   MCH 30.1  26.0 - 34.0 pg   MCHC 32.3  30.0 - 36.0 g/dL   RDW 14.0  11.5 - 15.5 %   Platelets 274  150 - 400 K/uL   Neutrophils Relative % 59  43 - 77 %   Neutro Abs 6.5  1.7 - 7.7 K/uL   Lymphocytes Relative 27  12 - 46 %   Lymphs Abs 3.0  0.7 - 4.0 K/uL   Monocytes Relative 10  3 - 12 %   Monocytes Absolute 1.1 (*) 0.1 - 1.0 K/uL   Eosinophils Relative 5  0 - 5 %   Eosinophils Absolute 0.5  0.0 - 0.7 K/uL   Basophils Relative 0  0 - 1 %   Basophils Absolute 0.0  0.0 - 0.1 K/uL  COMPREHENSIVE METABOLIC PANEL     Status: Abnormal   Collection Time    01/20/14  6:40 AM      Result Value Ref Range   Sodium 137  137 - 147 mEq/L   Potassium 3.3 (*) 3.7 - 5.3 mEq/L   Chloride 89 (*) 96 - 112 mEq/L   CO2 40 (*) 19 - 32 mEq/L   Comment: REPEATED TO VERIFY     CRITICAL RESULT CALLED TO, READ BACK BY AND VERIFIED WITH:     V.WASHINGTON,RN [id number] 01/20/14 CLARK,S   Glucose, Bld 99  70 - 99 mg/dL   BUN 25 (*) 6 - 23 mg/dL   Creatinine, Ser 0.62  0.50 - 1.10 mg/dL   Calcium 10.5  8.4 - 10.5 mg/dL   Total  Protein 6.9  6.0 - 8.3 g/dL   Albumin 2.9 (*) 3.5 - 5.2 g/dL   AST 56 (*) 0 - 37 U/L   ALT 44 (*) 0 - 35 U/L   Alkaline Phosphatase 80  39 - 117 U/L   Total Bilirubin 0.7  0.3 - 1.2 mg/dL   GFR calc non Af Amer 79 (*) >90 mL/min   GFR calc Af Amer >90  >90 mL/min   Comment: (NOTE)     The eGFR has been calculated using the CKD EPI equation.     This calculation has not been validated in all clinical situations.     eGFR's persistently <90 mL/min signify possible Chronic Kidney     Disease.      General: No acute distress Mood and affect are inappropriate Heart: Regular rate and rhythm no rubs murmurs or extra sounds Lungs:  Clear to auscultation, breathing unlabored, no rales or wheezes Abdomen: Positive bowel sounds, soft nontender to palpation, nondistended Extremities: No clubbing, cyanosis, or edema Skin: No evidence of breakdown, no evidence of rash Neurologic: Cranial nerves II through XII intact, motor strength is 4/5 in bilateral deltoid, bicep, tricep, grip, hip flexor, knee extensors, ankle dorsiflexor and plantar flexor Sensory exam cannot assess secondary to confusion Cerebellar exam normal finger to nose to finger as well as heel to shin in bilateral upper and lower extremities Musculoskeletal: Full range of motion in all 4 extremities. No joint swelling Oriented to person and place but inappropriate responses to sensory exam   Assessment/Plan: 1. Functional deficits secondary to small parietal infarct with metabolic encephalopathy which require 3+ hours per day of interdisciplinary therapy in a comprehensive inpatient rehab setting. Physiatrist is providing close team supervision and 24 hour management of active medical problems listed below. Physiatrist and rehab team continue to assess barriers to discharge/monitor patient progress toward functional and medical goals. Team conference today please see physician documentation under team conference tab, met with team  face-to-face to discuss problems,progress, and goals. Formulized individual treatment plan based on medical history, underlying problem and comorbidities. FIM:                   Comprehension Comprehension Mode: Auditory Comprehension: 5-Follows basic conversation/direction: With no assist  Expression Expression Mode: Verbal Expression: 5-Expresses basic needs/ideas: With no assist  Social Interaction Social Interaction: 6-Interacts appropriately with others with medication or extra time (anti-anxiety, antidepressant).  Problem Solving Problem Solving: 5-Solves basic problems: With no assist  Memory Memory: 5-Recognizes or recalls 90% of the time/requires cueing < 10% of the time  Medical Problem List and Plan:  1. Small acute lacunar infarct/sepsis/metabolic encephalopathy  2. DVT Prophylaxis/Anticoagulation: SCDs. Venous Dopplers negative  3. Pain Management: Tylenol as needed.  4. Mood/anxiety. Xanax 0.5 mg each bedtime as needed. Provide emotional support  5. Neuropsych: This patient is not capable of making decisions on her own behalf.  6. ID. Methicillin resistant coagulase-negative staph bacteremia. Continue IV vancomycin x6 weeks  7. Dysphagia. Dysphagia 3 thin liquids. Followup speech therapy  8.Hyponatremia. Resolved with latest sodium 137/Follow up labs , hypercarbic will stop O2, sats 100% recheck BMET 9. Hypertension. Lopressor 25 mg twice a day. Monitor with increased mobility  10. Hypothyroidism. Synthroid. Latest TSH is 0.187   LOS (Days) 1 A FACE TO FACE EVALUATION WAS PERFORMED  KIRSTEINS,ANDREW E 01/20/2014, 8:27 AM

## 2014-01-20 NOTE — Evaluation (Signed)
Speech Language Pathology Assessment and Plan  Patient Details  Name: Tammy Fernandez MRN: [id number] Date of Birth: [birth year]  SLP Diagnosis: Cognitive Impairments;Speech and Language deficits;Dysphagia  Rehab Potential: Fair ELOS: 2 weeks   Today's Date: 01/20/2014 Time: 1305-1350 Time Calculation (min): 45 min  Problem List:  Patient Active Problem List   Diagnosis Date Noted  . CVA (cerebral infarction) 01/19/2014  . Dysphagia, post-stroke 01/15/2014  . Bacteremia due to Staphylococcus 01/13/2014  . Acute respiratory failure with hypoxia 01/13/2014  . Acute encephalopathy 01/12/2014  . Left hemiplegia 01/12/2014  . Hypertension 01/11/2014  . Hyponatremia 01/11/2014  . Hyperkalemia 01/11/2014  . Hematuria 01/11/2014  . E. coli UTI (urinary tract infection) 01/11/2014  . GERD (gastroesophageal reflux disease) 11/14/2011  . Obesities, morbid 11/14/2011   Past Medical History:  Past Medical History  Diagnosis Date  . Hyperlipidemia   . Hypertension   . Anxiety   . GERD (gastroesophageal reflux disease)   . Abnormal heart rhythms   . Fatty liver   . Internal hemorrhoid    Past Surgical History:  Past Surgical History  Procedure Laterality Date  . Cesarean section    . Abdominal hysterectomy    . Tee without cardioversion N/A 01/19/2014    Procedure: TRANSESOPHAGEAL ECHOCARDIOGRAM (TEE);  Surgeon: Larey Dresser, MD;  Location: [facility] ENDOSCOPY;  Service: Cardiovascular;  Laterality: N/A;  [name]    Assessment / Plan / Recommendation Clinical Impression Tammy Fernandez is an 80 YO female history of hypertension. Per family report patient independent and active prior to admission. Admitted 01/11/2014 with altered mental status staring to the right not moving her left side per family report. Noted sodium level 115 and potassium 5.7. MRI of the brain 01/11/2014 showed no convincing evidence to suggest acute ischemia, however there was mild bright signal within the right  temporal cortex. Echocardiogram with ejection fraction 29% grade 1 diastolic dysfunction without embolus. A repeat MRI of the brain 01/14/2014 showed small acute lacunar infarct in the right parietal lobe (posterior right MCA territory) advised to continue aspirin. Patient placed on intravenous fluids of normal saline for hyponatremia. Renal service follow-up for hyponatremia question element of SIADH and workup ongoing. Physical therapy evaluation completed 01/12/2014 with recommendations of physical medicine rehabilitation consult to consider inpatient rehabilitation services 01/19/14.  Cognitive-linguistic and bedside swallow evaluations completed and revealed oral-motor deficits with left being more impaired than right; however, severity of cognitive deficits impacted performance.  Patient demonstrates mild oral dysphagia and severe cognitive impairments.  Cognition is impaired at the most basic level; sustained attention and as a result, all other higher level skills are also impaired.  Patient would benefit from skilled SLP services to address deficits and maximize ability to complete basic self-care tasks safely.         SLP Assessment  Patient will need skilled Speech Lanaguage Pathology Services during CIR admission    Recommendations  Diet Recommendations: Dysphagia 2 (Fine chop);Thin liquid Liquid Administration via: Cup;No straw Medication Administration: Whole meds with puree Supervision: Patient able to self feed;Full supervision/cueing for compensatory strategies Compensations: Slow rate;Small sips/bites;Check for pocketing Postural Changes and/or Swallow Maneuvers: Seated upright 90 degrees;Upright 30-60 min after meal Oral Care Recommendations: Oral care BID Patient destination: Home Follow up Recommendations: Home Health SLP;24 hour supervision/assistance Equipment Recommended: None recommended by SLP    SLP Frequency 5 out of 7 days   SLP Treatment/Interventions Cognitive  remediation/compensation;Cueing hierarchy;Dysphagia/aspiration precaution training;Internal/external aids;Functional tasks;Environmental controls;Patient/family education;Speech/Language facilitation;Therapeutic Activities    Pain  Pain Assessment Pain Assessment: No/denies pain Prior Functioning Cognitive/Linguistic Baseline: Within functional limits Type of Home: House  Lives With: Family;Son;Daughter Available Help at Discharge: Family;Available 24 hours/day Education: 5 th grade Vocation: Retired  Industrial/product designer Term Goals: Week 1: SLP Short Term Goal 1 (Week 1): Patient will consume Dys.2 textures and thin liquids with Max verbal and tactile cues to utilize safe swallow strategies. SLP Short Term Goal 2 (Week 1): Patient will maintain topic of conversation for 3 turns with Mod clinician cues. SLP Short Term Goal 3 (Week 1): Patient will sustain attention to basic self-care task for 30 seconds with Mod verbal and visual cues. SLP Short Term Goal 4 (Week 1): Patient will utilize call bell to request help with Max verbal and visual cues. SLP Short Term Goal 5 (Week 1): Patient will utilize external aids for orientation with Max clinician cues.  See FIM for current functional status Refer to Care Plan for Long Term Goals  Recommendations for other services: None  Discharge Criteria: Patient will be discharged from SLP if patient refuses treatment 3 consecutive times without medical reason, if treatment goals not met, if there is a change in medical status, if patient makes no progress towards goals or if patient is discharged from hospital.  The above assessment, treatment plan, treatment alternatives and goals were discussed and mutually agreed upon: by patient and by family  Carmelia Roller., [place]  Mona 01/20/2014, 4:28 PM

## 2014-01-20 NOTE — Plan of Care (Signed)
Problem: RH SKIN INTEGRITY Goal: RH STG SKIN FREE OF INFECTION/BREAKDOWN No new skin breakdown/infection.  Outcome: Not Progressing Stage 2 pressure ulcer present on discharge

## 2014-01-20 NOTE — Progress Notes (Signed)
Patient information reviewed and entered into eRehab system by Sarp Vernier, RN, CRRN, PPS Coordinator.  Information including medical coding and functional independence measure will be reviewed and updated through discharge.     Per nursing patient was given "Data Collection Information Summary for Patients in Inpatient Rehabilitation Facilities with attached "Privacy Act Statement-Health Care Records" upon admission.  

## 2014-01-20 NOTE — Progress Notes (Signed)
Social Work Lucy Chris, LCSW Social Worker Signed  Patient Care Conference Service date: 01/20/2014 4:00 PM  Inpatient RehabilitationTeam Conference and Plan of Care Update Date: 01/20/2014   Time: 12;00 PM     Patient Name: Tammy Fernandez       Medical Record Number: [id number]   Date of Birth: 04/06/1924 Sex: Female         Room/Bed: 4W20C/4W20C-01 Payor Info: Payor: MEDICARE / Plan: MEDICARE PART A AND B / Product Type: *No Product type* /   Admitting Diagnosis: CVA   Admit Date/Time:  01/19/2014  7:26 PM Admission Comments: No comment available   Primary Diagnosis:  <principal problem not specified> Principal Problem: <principal problem not specified>    Patient Active Problem List     Diagnosis  Date Noted   .  CVA (cerebral infarction)  01/19/2014   .  Dysphagia, post-stroke  01/15/2014   .  Bacteremia due to Staphylococcus  01/13/2014   .  Acute respiratory failure with hypoxia  01/13/2014   .  Acute encephalopathy  01/12/2014   .  Left hemiplegia  01/12/2014   .  Hypertension  01/11/2014   .  Hyponatremia  01/11/2014   .  Hyperkalemia  01/11/2014   .  Hematuria  01/11/2014   .  E. coli UTI (urinary tract infection)  01/11/2014   .  GERD (gastroesophageal reflux disease)  11/14/2011   .  Obesities, morbid  11/14/2011     Expected Discharge Date: Expected Discharge Date: 02/05/14  Team Members Present: Physician leading conference: Dr. Claudette Laws Social Worker Present: Staci Acosta, LCSW;Becky Seraiah Nowack, LCSW Nurse Present: Daryll Brod, RN PT Present: Edman Circle, PT OT Present: Rosalio Loud, OT;Kris Gellert, Heath Lark, OT SLP Present: Fae Pippin, SLP PPS Coordinator present : Edson Snowball, Chapman Fitch, RN, CRRN        Current Status/Progress  Goal  Weekly Team Focus   Medical     anxiety, hypercarbia, low K, elyte abnormailities  maintain medical stability  further medical w/u    Bowel/Bladder     Requiring I&O cath. LBM 01/18/14   Managed bowel and bladder  Timed toileting q 2-3 hrs   Swallow/Nutrition/ Hydration     evaluation pending       ADL's     eval pending       Mobility     +2 (pt performing >60%, pt very anxious, requires IV and supplemental 02)  Supervision-min A overall  Attention and awareness during mobility, transfers, gait   Communication     evaluation pending       Safety/Cognition/ Behavioral Observations    evaluation pending       Pain     No c/o pain  <3  Offer pain medication 1hr prior to initial therapy session. Monitor for nonverbal cues   Skin     Stage 2 pressure ulcer with Allevyn dressing intact. Tape burn to L thigh, open to air, unremarkable  No additional skin breakdown  Assess with turn q 2hrs     *See Care Plan and progress notes for long and short-term goals.    Barriers to Discharge:  confusion interfering with therapy      Possible Resolutions to Barriers:         Discharge Planning/Teaching Needs:      Home with family providing 24 hr care-someone is always here with her.   Team Discussion:    New eval-goals-supervision/min level.  Anxious-nerves-on meds.  Ques if needs  vestibular eval later   Revisions to Treatment Plan:    New eval    Continued Need for Acute Rehabilitation Level of Care: The patient requires daily medical management by a physician with specialized training in physical medicine and rehabilitation for the following conditions: Daily direction of a multidisciplinary physical rehabilitation program to ensure safe treatment while eliciting the highest outcome that is of practical value to the patient.: Yes Daily medical management of patient stability for increased activity during participation in an intensive rehabilitation regime.: Yes Daily analysis of laboratory values and/or radiology reports with any subsequent need for medication adjustment of medical intervention for : Neurological problems  Lucy ChrisDupree, Alice Burnside G 01/20/2014, 4:01 PM           Patient ID: Hollie Beachosa L Hinnenkamp, female   DOB: June 03, 1924, 80 y.o.   MRN: [id number]

## 2014-01-20 NOTE — Patient Care Conference (Signed)
Inpatient RehabilitationTeam Conference and Plan of Care Update Date: 01/20/2014   Time: 12;00 PM    Patient Name: Tammy Fernandez      Medical Record Number: [id number]  Date of Birth: 06-23-1924 Sex: Female         Room/Bed: 4W20C/4W20C-01 Payor Info: Payor: MEDICARE / Plan: MEDICARE PART A AND B / Product Type: *No Product type* /    Admitting Diagnosis: CVA  Admit Date/Time:  01/19/2014  7:26 PM Admission Comments: No comment available   Primary Diagnosis:  <principal problem not specified> Principal Problem: <principal problem not specified>  Patient Active Problem List   Diagnosis Date Noted  . CVA (cerebral infarction) 01/19/2014  . Dysphagia, post-stroke 01/15/2014  . Bacteremia due to Staphylococcus 01/13/2014  . Acute respiratory failure with hypoxia 01/13/2014  . Acute encephalopathy 01/12/2014  . Left hemiplegia 01/12/2014  . Hypertension 01/11/2014  . Hyponatremia 01/11/2014  . Hyperkalemia 01/11/2014  . Hematuria 01/11/2014  . E. coli UTI (urinary tract infection) 01/11/2014  . GERD (gastroesophageal reflux disease) 11/14/2011  . Obesities, morbid 11/14/2011    Expected Discharge Date: Expected Discharge Date: 02/05/14  Team Members Present: Physician leading conference: Dr. Claudette Laws Social Worker Present: Staci Acosta, LCSW;Becky Luca Dyar, LCSW Nurse Present: Daryll Brod, RN PT Present: Edman Circle, PT OT Present: Rosalio Loud, OT;Kris Gellert, Heath Lark, OT SLP Present: Fae Pippin, SLP PPS Coordinator present : Edson Snowball, Chapman Fitch, RN, CRRN     Current Status/Progress Goal Weekly Team Focus  Medical   anxiety, hypercarbia, low K, elyte abnormailities  maintain medical stability  further medical w/u    Bowel/Bladder   Requiring I&O cath. LBM 01/18/14  Managed bowel and bladder  Timed toileting q 2-3 hrs   Swallow/Nutrition/ Hydration   evaluation pending         ADL's     eval pending        Mobility   +2 (pt  performing >60%, pt very anxious, requires IV and supplemental 02)  Supervision-min A overall  Attention and awareness during mobility, transfers, gait   Communication   evaluation pending         Safety/Cognition/ Behavioral Observations  evaluation pending         Pain   No c/o pain  <3  Offer pain medication 1hr prior to initial therapy session. Monitor for nonverbal cues   Skin   Stage 2 pressure ulcer with Allevyn dressing intact. Tape burn to L thigh, open to air, unremarkable  No additional skin breakdown  Assess with turn q 2hrs      *See Care Plan and progress notes for long and short-term goals.  Barriers to Discharge: confusion interfering with therapy    Possible Resolutions to Barriers:       Discharge Planning/Teaching Needs:       Home with family providing 24 hr care-someone is always here with her.  Team Discussion:  New eval-goals-supervision/min level.  Anxious-nerves-on meds.  Ques if needs vestibular eval later  Revisions to Treatment Plan:  New eval   Continued Need for Acute Rehabilitation Level of Care: The patient requires daily medical management by a physician with specialized training in physical medicine and rehabilitation for the following conditions: Daily direction of a multidisciplinary physical rehabilitation program to ensure safe treatment while eliciting the highest outcome that is of practical value to the patient.: Yes Daily medical management of patient stability for increased activity during participation in an intensive rehabilitation regime.: Yes Daily analysis of laboratory values  and/or radiology reports with any subsequent need for medication adjustment of medical intervention for : Neurological problems  [name], Lemar LivingsRebecca G 01/20/2014, 4:01 PM

## 2014-01-20 NOTE — Progress Notes (Signed)
Physical Therapy Session Note  Patient Details  Name: Hollie BeachRosa L Holifield MRN: [id number] Date of Birth: [birth year]  Today's Date: 01/20/2014 Time: 4098-11911415-1445 Time Calculation (min): 30 min  Short Term Goals: Week 1:  PT Short Term Goal 1 (Week 1): Pt will perform bed mobility to L and R on flat bed with mod A and 75% verbal cues for sequencing PT Short Term Goal 2 (Week 1): Pt will perform bed <> w/c transfers with consistent mod A and 75% cues for sequencing PT Short Term Goal 3 (Week 1): Pt will perform gait x 50' in controlled environment with mod A and 75% cues for safety/attention PT Short Term Goal 4 (Week 1): Pt will perform stair negotiation up/down 4 stairs with bilat rails and mod A and 75% cues for sequencing/safety  Skilled Therapeutic Interventions/Progress Updates:   Pt received lying in bed this afternoon, agreeable to therapy.  Note she tends to perseverate on "I'm nervous" or "my nerves" throughout entire session.  Performed supine to sit at max assist with max verbal and hand over hand cues for utilizing bed rail to assist in sitting up on EOB.  Performed stand pivot transfer bed to chair at mod assist with max verbal cues for safety and technique.  She sat prior to being fully in chair and was unable to problem solve how to correct sitting position.  Assisted into standing again with mod assist for better position in chair.  Assisted pt to gym via w/c at total assist in order to perform gait training with RW.  [facility] 7[facility]/max verbal cues for continued deep breathing, upright posture and attention to the L.  Attempted to have pt state number of fingers being held from therapist, however she would guess "10" or "7" but not actually scan to the L.  Ended session with stair negotiation with B handrails at mod assist with max verbal cues for step to technique, however pt unable to follow commands and kept using alternating pattern.   Pt returned to chair and  states "I'm dizzy."  Still unable to determine cause of dizziness at this time.  Also note that pt remained on RA entire session with SaO2 remaining in 90's throughout.  RN made aware. Pt assisted back to room and left in w/c with family present.  Family educated to notify RN if they are leaving to place safety belt on pt.  RN also made aware.   Therapy Documentation Precautions:  Precautions Precautions: Fall Restrictions Weight Bearing Restrictions: No   Pain: Pain Assessment Pain Assessment: No/denies pain  Locomotion : Ambulation Ambulation/Gait Assistance: 3: Mod assist;4: Min assist   See FIM for current functional status  Therapy/Group: Individual Therapy  Vista Deckarcell, Tieler Cournoyer Ann 01/20/2014, 4:08 PM

## 2014-01-20 NOTE — Care Management Note (Signed)
Inpatient Rehabilitation Center Individual Statement of Services  Patient Name:  Tammy Fernandez  Date:  01/20/2014  Welcome to the Inpatient Rehabilitation Center.  Our goal is to provide you with an individualized program based on your diagnosis and situation, designed to meet your specific needs.  With this comprehensive rehabilitation program, you will be expected to participate in at least 3 hours of rehabilitation therapies Monday-Friday, with modified therapy programming on the weekends.  Your rehabilitation program will include the following services:  Physical Therapy (PT), Occupational Therapy (OT), Speech Therapy (ST), 24 hour per day rehabilitation nursing, Neuropsychology, Case Management (Social Worker), Rehabilitation Medicine, Nutrition Services and Pharmacy Services  Weekly team conferences will be held on Wednesday to discuss your progress.  Your Social Worker will talk with you frequently to get your input and to update you on team discussions.  Team conferences with you and your family in attendance may also be held.  Expected length of stay: 2 weeks Overall anticipated outcome: supervision/min level  Depending on your progress and recovery, your program may change. Your Social Worker will coordinate services and will keep you informed of any changes. Your Social Worker's name and contact numbers are listed  below.  The following services may also be recommended but are not provided by the Inpatient Rehabilitation Center:   Home Health Rehabiltiation Services  Outpatient Rehabilitation Services   Arrangements will be made to provide these services after discharge if needed.  Arrangements include referral to agencies that provide these services.  Your insurance has been verified to be:  [employer] & [employer] Your primary doctor is:  Dr. Elias Elseobert Reade  Pertinent information will be shared with your doctor and your insurance company.  Social Worker:  Dossie DerBecky Fortune Torosian, SW  [phone] or [phone]) [phone]  Information discussed with and copy given to patient by: Lucy Chrisupree, Fleurette Woolbright G, 01/20/2014, 3:26 PM

## 2014-01-20 NOTE — Plan of Care (Signed)
Problem: RH BLADDER ELIMINATION Goal: RH STG MANAGE BLADDER WITH ASSISTANCE STG Manage Bladder With minimal Assistance  Outcome: Not Progressing Requring I&O cath

## 2014-01-21 ENCOUNTER — Inpatient Hospital Stay (HOSPITAL_COMMUNITY): Payer: PRIVATE HEALTH INSURANCE | Admitting: Speech Pathology

## 2014-01-21 ENCOUNTER — Inpatient Hospital Stay (HOSPITAL_COMMUNITY): Payer: PRIVATE HEALTH INSURANCE | Admitting: Occupational Therapy

## 2014-01-21 ENCOUNTER — Inpatient Hospital Stay (HOSPITAL_COMMUNITY): Payer: PRIVATE HEALTH INSURANCE | Admitting: Rehabilitation

## 2014-01-21 DIAGNOSIS — I6350 Cerebral infarction due to unspecified occlusion or stenosis of unspecified cerebral artery: Secondary | ICD-10-CM

## 2014-01-21 DIAGNOSIS — I69991 Dysphagia following unspecified cerebrovascular disease: Secondary | ICD-10-CM

## 2014-01-21 DIAGNOSIS — R7881 Bacteremia: Secondary | ICD-10-CM

## 2014-01-21 DIAGNOSIS — G9341 Metabolic encephalopathy: Secondary | ICD-10-CM

## 2014-01-21 DIAGNOSIS — A4902 Methicillin resistant Staphylococcus aureus infection, unspecified site: Secondary | ICD-10-CM

## 2014-01-21 LAB — VANCOMYCIN, TROUGH: Vancomycin Tr: 13 ug/mL (ref 10.0–20.0)

## 2014-01-21 MED ORDER — QUETIAPINE FUMARATE 25 MG PO TABS
25.0000 mg | ORAL_TABLET | Freq: Every day | ORAL | Status: DC
  Filled 2014-01-21 (×2): qty 1

## 2014-01-21 MED ORDER — VANCOMYCIN HCL 10 G IV SOLR
1750.0000 mg | INTRAVENOUS | Status: DC
  Administered 2014-01-22 – 2014-01-26 (×5): 1750 mg via INTRAVENOUS
  Filled 2014-01-21 (×5): qty 1750

## 2014-01-21 MED ORDER — VANCOMYCIN HCL 10 G IV SOLR
2000.0000 mg | INTRAVENOUS | Status: DC
  Filled 2014-01-21: qty 2000

## 2014-01-21 MED ORDER — POTASSIUM CHLORIDE CRYS ER 20 MEQ PO TBCR
20.0000 meq | EXTENDED_RELEASE_TABLET | Freq: Every day | ORAL | Status: DC
  Administered 2014-01-21 – 2014-01-28 (×8): 20 meq via ORAL
  Filled 2014-01-21 (×9): qty 1

## 2014-01-21 MED ORDER — LIDOCAINE HCL 2 % EX GEL
CUTANEOUS | Status: DC | PRN
  Filled 2014-01-21: qty 5

## 2014-01-21 NOTE — Progress Notes (Signed)
Occupational Therapy Session Note  Patient Details  Name: Tammy BeachRosa L Crocker MRN: [id number] Date of Birth: 10-01-1924  Today's Date: 01/21/2014 Time: 9528-41321503-1531 Time Calculation (min): 28 min  Skilled Therapeutic Interventions/Progress Updates:     Pt with increased anxiety and perseveration on her bowels and BP during session.  Pt needed max reassurance that she needed to work with therapy, and family present also gave support for therapy.  Pt continued to make excuses and be distracted internally throughout session.  Needs max instructional cueing for re-direction.  Pt eventually able to transfer to the EOB with min assist and attempt toilet transfer by ambulating to the bathroom with the RW.  Other family also assisting with transfer even though therapist was right beside her.  Pt also voicing to her son and daughter-in-law to come help her prior to transfer.  Pt with increased trunk flexion when ambulating to the bathroom as well as pushing the RW too far in front of her.  Pt unable to have bowel movement and perseverated on needing an enema.  Pt needed re-direction that therapist could not get her one but could let nursing know.  Pt ambulated back to the bed with max encouragement and min assist to stand from the toilet.  Pt placed in supine with family present and NT in to check her BP per request.    Therapy Documentation Precautions:  Precautions Precautions: Fall Restrictions Weight Bearing Restrictions: No  Pain: Pain Assessment Pain Assessment: No/denies pain Pain Score: 0-No pain  See FIM for current functional status  Therapy/Group: Individual Therapy  Mattisyn Cardona OTR/L 01/21/2014, 4:06 PM

## 2014-01-21 NOTE — Progress Notes (Signed)
Patient remains confused this shift.  She is alert and oriented x2, to person and place.  She continues to be disoriented time and situation.  She is cooperative with staff and loves to talk.  She is having trouble with voiding.  Due to inability to void this shift, she was started on NS 1450mL/hr and I&O cath was performed.  She tolerated the procedure well.  Nursing staff have continued to encourage PO intake.  Patient will only take small sips saying "I don't want to get strangled."  Continue with encouragement to void.  I&O caths to be performed as needed.  Indwelling foley may have to be revisited.

## 2014-01-21 NOTE — Progress Notes (Signed)
Subjective/Complaints: 80 y.o. female history hypertension. Patient independent and active prior to admission. Admitted 01/11/2014 with altered mental status staring to the right not moving her left side per family report. Noted sodium level 115 and potassium 5.7. MRI of the brain 01/11/2014 showed no convincing evidence to suggest acute ischemia, however there was mild bright signal within the right temporal cortex. Echocardiogram with ejection fraction 23% grade 1 diastolic dysfunction without embolus. EEG was negative for seizure. Carotid Dopplers with no ICA stenosis. Placed on aspirin for CVA prophylaxis. A repeat MRI of the brain 01/14/2014 showed small acute lacunar infarct in the right parietal lobe(posterior right MCA territory) advised to continue aspirin. Patient placed on intravenous fluids of normal saline for hyponatremia. Renal service followup for hyponatremia question element of SIADH and workup ongoing. Urine culture greater than 100,000 gram-negative rods placed on Rocephin. Blood cultures x2 on admission of methicillin resistant coagulase-negative staph bacteremia with infectious disease consult to Dr. Megan Salon and Rocephin was changed to intravenous vancomycin x6 weeks. TEE completed 01/19/2014 showing ejection fraction 70% no evidence of endocarditis without thrombus . There was an atrial septal aneurysm with PFO. Venous Doppler studies lower extremities negative  Has been up, unable to sleep for 24 hours per family report, son unaware of CVA Objective: Vital Signs: Blood pressure 171/86, pulse 89, temperature 97.8 F (36.6 C), temperature source Oral, resp. rate 18, weight 85.8 kg (189 lb 2.5 oz), SpO2 95.00%. Ir Fluoro Guide Cv Line Right  01/19/2014   CLINICAL DATA:  Sepsis  EXAM: Right upper extremity PICC LINE PLACEMENT WITH ULTRASOUND AND FLUOROSCOPIC GUIDANCE  FLUOROSCOPY TIME:  6 seconds.  PROCEDURE: The patient was advised of the possible risks andcomplications and agreed to  undergo the procedure. The patient was then brought to the angiographic suite for the procedure.  The right arm was prepped with chlorhexidine, drapedin the usual sterile fashion using maximum barrier technique (cap and mask, sterile gown, sterile gloves, large sterile sheet, hand hygiene and cutaneous antisepsis) and infiltrated locally with 1% Lidocaine.  Ultrasound demonstrated patency of the right cephalic vein, and this was documented with an image. Under real-time ultrasound guidance, this vein was accessed with a 21 gauge micropuncture needle and image documentation was performed. A 0.018 wire was introduced in to the vein. Over this, a 5 [place] double lumen Power PICC, trimmed to 37 cm, was advanced to the lower SVC/right atrial junction. Fluoroscopy during the procedure and fluoro spot radiograph confirms appropriate catheter position. The catheter was flushed and covered with asterile dressing.  Complications: None  IMPRESSION: Successful right arm Power PICC line placement with ultrasound and fluoroscopic guidance. The catheter is ready for use.   Electronically Signed   By: Maryclare Bean M.D.   On: 01/19/2014 14:28   Ir US Guide Vasc Access Right  01/19/2014   CLINICAL DATA:  Sepsis  EXAM: Right upper extremity PICC LINE PLACEMENT WITH ULTRASOUND AND FLUOROSCOPIC GUIDANCE  FLUOROSCOPY TIME:  6 seconds.  PROCEDURE: The patient was advised of the possible risks andcomplications and agreed to undergo the procedure. The patient was then brought to the angiographic suite for the procedure.  The right arm was prepped with chlorhexidine, drapedin the usual sterile fashion using maximum barrier technique (cap and mask, sterile gown, sterile gloves, large sterile sheet, hand hygiene and cutaneous antisepsis) and infiltrated locally with 1% Lidocaine.  Ultrasound demonstrated patency of the right cephalic vein, and this was documented with an image. Under real-time ultrasound guidance, this vein was accessed with a  21 gauge micropuncture needle and image documentation was performed. A 0.018 wire was introduced in to the vein. Over this, a 5 [place] double lumen Power PICC, trimmed to 37 cm, was advanced to the lower SVC/right atrial junction. Fluoroscopy during the procedure and fluoro spot radiograph confirms appropriate catheter position. The catheter was flushed and covered with asterile dressing.  Complications: None  IMPRESSION: Successful right arm Power PICC line placement with ultrasound and fluoroscopic guidance. The catheter is ready for use.   Electronically Signed   By: Maryclare Bean M.D.   On: 01/19/2014 14:28   Results for orders placed during the hospital encounter of 01/19/14 (from the past 72 hour(s))  CBC WITH DIFFERENTIAL     Status: Abnormal   Collection Time    01/20/14  6:40 AM      Result Value Ref Range   WBC 11.2 (*) 4.0 - 10.5 K/uL   RBC 4.09  3.87 - 5.11 MIL/uL   Hemoglobin 12.3  12.0 - 15.0 g/dL   HCT 38.1  36.0 - 46.0 %   MCV 93.2  78.0 - 100.0 fL   MCH 30.1  26.0 - 34.0 pg   MCHC 32.3  30.0 - 36.0 g/dL   RDW 14.0  11.5 - 15.5 %   Platelets 274  150 - 400 K/uL   Neutrophils Relative % 59  43 - 77 %   Neutro Abs 6.5  1.7 - 7.7 K/uL   Lymphocytes Relative 27  12 - 46 %   Lymphs Abs 3.0  0.7 - 4.0 K/uL   Monocytes Relative 10  3 - 12 %   Monocytes Absolute 1.1 (*) 0.1 - 1.0 K/uL   Eosinophils Relative 5  0 - 5 %   Eosinophils Absolute 0.5  0.0 - 0.7 K/uL   Basophils Relative 0  0 - 1 %   Basophils Absolute 0.0  0.0 - 0.1 K/uL  COMPREHENSIVE METABOLIC PANEL     Status: Abnormal   Collection Time    01/20/14  6:40 AM      Result Value Ref Range   Sodium 137  137 - 147 mEq/L   Potassium 3.3 (*) 3.7 - 5.3 mEq/L   Chloride 89 (*) 96 - 112 mEq/L   CO2 40 (*) 19 - 32 mEq/L   Comment: REPEATED TO VERIFY     CRITICAL RESULT CALLED TO, READ BACK BY AND VERIFIED WITH:     V.WASHINGTON,RN [id number] 01/20/14 CLARK,S   Glucose, Bld 99  70 - 99 mg/dL   BUN 25 (*) 6 - 23 mg/dL    Creatinine, Ser 0.62  0.50 - 1.10 mg/dL   Calcium 10.5  8.4 - 10.5 mg/dL   Total Protein 6.9  6.0 - 8.3 g/dL   Albumin 2.9 (*) 3.5 - 5.2 g/dL   AST 56 (*) 0 - 37 U/L   ALT 44 (*) 0 - 35 U/L   Alkaline Phosphatase 80  39 - 117 U/L   Total Bilirubin 0.7  0.3 - 1.2 mg/dL   GFR calc non Af Amer 79 (*) >90 mL/min   GFR calc Af Amer >90  >90 mL/min   Comment: (NOTE)     The eGFR has been calculated using the CKD EPI equation.     This calculation has not been validated in all clinical situations.     eGFR's persistently <90 mL/min signify possible Chronic Kidney     Disease.  BASIC METABOLIC PANEL     Status: Abnormal  Collection Time    01/20/14 11:35 AM      Result Value Ref Range   Sodium 135 (*) 137 - 147 mEq/L   Potassium 3.2 (*) 3.7 - 5.3 mEq/L   Chloride 91 (*) 96 - 112 mEq/L   CO2 39 (*) 19 - 32 mEq/L   Glucose, Bld 113 (*) 70 - 99 mg/dL   BUN 25 (*) 6 - 23 mg/dL   Creatinine, Ser 0.64  0.50 - 1.10 mg/dL   Calcium 9.7  8.4 - 10.5 mg/dL   GFR calc non Af Amer 78 (*) >90 mL/min   GFR calc Af Amer >90  >90 mL/min   Comment: (NOTE)     The eGFR has been calculated using the CKD EPI equation.     This calculation has not been validated in all clinical situations.     eGFR's persistently <90 mL/min signify possible Chronic Kidney     Disease.  VANCOMYCIN, TROUGH     Status: None   Collection Time    01/21/14  5:25 AM      Result Value Ref Range   Vancomycin Tr 13.0  10.0 - 20.0 ug/mL      General: No acute distress Mood and affect are inappropriate Heart: Regular rate and rhythm no rubs murmurs or extra sounds Lungs: Clear to auscultation, breathing unlabored, no rales or wheezes Abdomen: Positive bowel sounds, soft nontender to palpation, nondistended Extremities: No clubbing, cyanosis, or edema Skin: No evidence of breakdown, no evidence of rash Neurologic: Cranial nerves II through XII intact, motor strength is 4/5 in bilateral deltoid, bicep, tricep, grip, hip  flexor, knee extensors, ankle dorsiflexor and plantar flexor Sensory exam cannot assess secondary to confusion Cerebellar exam normal finger to nose to finger as well as heel to shin in bilateral upper and lower extremities Musculoskeletal: Full range of motion in all 4 extremities. No joint swelling Oriented to person and place, not time unsure of day or night but inappropriate responses to sensory exam   Assessment/Plan: 1. Functional deficits secondary to small parietal infarct with metabolic encephalopathy which require 3+ hours per day of interdisciplinary therapy in a comprehensive inpatient rehab setting. Physiatrist is providing close team supervision and 24 hour management of active medical problems listed below. Physiatrist and rehab team continue to assess barriers to discharge/monitor patient progress toward functional and medical goals.  FIM: FIM - Bathing Bathing: 0: Activity did not occur  FIM - Upper Body Dressing/Undressing Upper body dressing/undressing: 0: Activity did not occur FIM - Lower Body Dressing/Undressing Lower body dressing/undressing: 0: Activity did not occur  FIM - Toileting Toileting: 0: Activity did not occur  FIM - Radio producer Devices: Recruitment consultant Transfers: 1-Two helpers  FIM - Control and instrumentation engineer Devices: Arm rests;Bed rails;HOB elevated Bed/Chair Transfer: 2: Supine > Sit: Max A (lifting assist/Pt. 25-49%);3: Bed > Chair or W/C: Mod A (lift or lower assist)  FIM - Locomotion: Wheelchair Distance: 150 total A Locomotion: Wheelchair: 1: Total Assistance/staff pushes wheelchair (Pt<25%) FIM - Locomotion: Ambulation Locomotion: Ambulation Assistive Devices: Administrator Ambulation/Gait Assistance: 3: Mod assist;4: Min assist Locomotion: Ambulation: 2: Travels 50 - 149 ft with moderate assistance (Pt: 50 - 74%)  Comprehension Comprehension Mode: Auditory Comprehension:  2-Understands basic 25 - 49% of the time/requires cueing 51 - 75% of the time  Expression Expression Mode: Verbal Expression: 3-Expresses basic 50 - 74% of the time/requires cueing 25 - 50% of the time. Needs to repeat parts  of sentences.  Social Interaction Social Interaction: 2-Interacts appropriately 25 - 49% of time - Needs frequent redirection.  Problem Solving Problem Solving: 1-Solves basic less than 25% of the time - needs direction nearly all the time or does not effectively solve problems and may need a restraint for safety  Memory Memory: 2-Recognizes or recalls 25 - 49% of the time/requires cueing 51 - 75% of the time  Medical Problem List and Plan:  1. Small acute lacunar infarct/sepsis/metabolic encephalopathy with disrupted sleep /wake cycles , trial low dose seroquel 2. DVT Prophylaxis/Anticoagulation: SCDs. Venous Dopplers negative  3. Pain Management: Tylenol as needed.  4. Mood/anxiety. Xanax 0.5 mg each bedtime as needed. Provide emotional support  5. Neuropsych: This patient is not capable of making decisions on her own behalf.  6. ID. Methicillin resistant coagulase-negative staph bacteremia. Continue IV vancomycin x6 weeks  7. Dysphagia. Dysphagia 3 thin liquids. Followup speech therapy  8.Hyponatremia. Resolved with latest sodium 137/Follow up labs , hypercarbic will stop O2, sats 100% recheck BMET 9. Hypertension. Lopressor 25 mg twice a day. Monitor with increased mobility  10. Hypothyroidism. Synthroid. Latest TSH is 0.187   LOS (Days) 2 A FACE TO FACE EVALUATION WAS PERFORMED  KIRSTEINS,ANDREW E 01/21/2014, 7:08 AM

## 2014-01-21 NOTE — Progress Notes (Signed)
HgbA1C is 6.6%.  6.5% is a diagnosis of diabetes by the American Diabetes Association. No history of diabetes noted.  Smith MinceKendra Keller Mikels RN BSN CDE

## 2014-01-21 NOTE — Progress Notes (Addendum)
ANTIBIOTIC CONSULT NOTE - FOLLOW UP  Pharmacy Consult for vancomycin Indication: bacteremia  Labs:  Recent Labs  01/18/14 1036 01/19/14 0335 01/20/14 0640 01/20/14 1135  WBC 16.9* 14.4* 11.2*  --   HGB 13.8 12.6 12.3  --   PLT 325 PLATELET CLUMPS NOTED ON SMEAR, COUNT APPEARS ADEQUATE 274  --   CREATININE 0.62 0.63 0.62 0.64    Recent Labs  01/21/14 0525  VANCOTROUGH 13.0     Microbiology: Recent Results (from the past 720 hour(s))  URINE CULTURE     Status: None   Collection Time    01/11/14  7:51 PM      Result Value Ref Range Status   Specimen Description URINE, CATHETERIZED   Final   Special Requests NONE   Final   Culture  Setup Time     Final   Value: 01/11/2014 21:34     Performed at [facility] Count     Final   Value: >=100,000 COLONIES/ML     Performed at [facility]   Culture     Final   Value: ESCHERICHIA COLI     Performed at [facility]   Report Status 01/13/2014 FINAL   Final   Organism ID, Bacteria ESCHERICHIA COLI   Final  CULTURE, BLOOD (ROUTINE X 2)     Status: None   Collection Time    01/12/14  6:00 AM      Result Value Ref Range Status   Specimen Description BLOOD RIGHT HAND   Final   Special Requests BOTTLES DRAWN AEROBIC ONLY 5CC   Final   Culture  Setup Time     Final   Value: 01/12/2014 10:21     Performed at [facility]   Culture     Final   Value: STAPHYLOCOCCUS SPECIES (COAGULASE NEGATIVE)     Note: SUSCEPTIBILITIES PERFORMED ON PREVIOUS CULTURE WITHIN THE LAST 5 DAYS.     Note: Gram Stain Report Called to,Read Back By and Verified With: Gray Bernhardt [id number] BRMEL     Performed at [facility]   Report Status 01/15/2014 FINAL   Final  CULTURE, BLOOD (ROUTINE X 2)     Status: None   Collection Time    01/12/14  6:06 AM      Result Value Ref Range Status   Specimen Description BLOOD RIGHT ANTECUBITAL   Final   Special Requests BOTTLES DRAWN AEROBIC ONLY 3CC   Final   Culture  Setup Time     Final   Value: 01/12/2014 10:21     Performed at [facility]   Culture     Final   Value: STAPHYLOCOCCUS SPECIES (COAGULASE NEGATIVE)     Note: RIFAMPIN AND GENTAMICIN SHOULD NOT BE USED AS SINGLE DRUGS FOR TREATMENT OF STAPH INFECTIONS.     Note: Gram Stain Report Called to,Read Back By and Verified With: Kerby Moors [id number] BRMEL     Performed at [facility]   Report Status 01/15/2014 FINAL   Final   Organism ID, Bacteria STAPHYLOCOCCUS SPECIES (COAGULASE NEGATIVE)   Final  MRSA PCR SCREENING     Status: None   Collection Time    01/12/14  6:35 AM      Result Value Ref Range Status   MRSA by PCR NEGATIVE  NEGATIVE Final   Comment:            The GeneXpert MRSA Assay (FDA     approved  for NASAL specimens     only), is one component of a     comprehensive MRSA colonization     surveillance program. It is not     intended to diagnose MRSA     infection nor to guide or     monitor treatment for     MRSA infections.  CULTURE, BLOOD (ROUTINE X 2)     Status: None   Collection Time    01/18/14 12:20 PM      Result Value Ref Range Status   Specimen Description BLOOD RIGHT HAND   Final   Special Requests BOTTLES DRAWN AEROBIC ONLY 5CC   Final   Culture  Setup Time     Final   Value: 01/18/2014 16:16     [facility]   Culture     Final   Value:        BLOOD CULTURE RECEIVED NO GROWTH TO DATE CULTURE WILL BE HELD FOR 5 DAYS BEFORE ISSUING A FINAL NEGATIVE REPORT     [facility]   Report Status PENDING   Incomplete     Assessment: 80yo female slightly subtherapeutic on vancomycin with initial dosing for bacteremia.  Goal of Therapy:  Vancomycin trough level 15-20 mcg/ml  Plan:  Will change vancomycin 2000mg  IV Q24H for calculated trough closer to 17 and continue to monitor.  Vernard GamblesVeronda Bryk, PharmD, BCPS  01/21/2014,6:44 AM  Addendum: Due to patient's age and also reduced in UOP, will  change vancomycin to 1750mg  iv q24h for now.  Tsz-Yin Dorinda Stehr, PharmD

## 2014-01-21 NOTE — IPOC Note (Signed)
Overall Plan of Care [facility](IPOC) Patient Details Name: Tammy Fernandez MRN: [id number] DOB: 02-26-1924  Admitting Diagnosis: CVA  Hospital Problems: Active Problems:   CVA (cerebral infarction)     Functional Problem List: Nursing Bladder;Bowel;Pain;Safety;Skin Integrity  PT Balance;Endurance;Motor;Perception;Safety  OT Cognition;Balance;Endurance;Safety;Motor;Behavior  SLP Cognition;Linguistic;Nutrition  TR         Basic ADL's: OT Eating;Grooming;Bathing;Dressing;Toileting     Advanced  ADL's: OT Simple Meal Preparation     Transfers: PT Bed Mobility;Bed to Chair;Car;Furniture  OT Toilet;Tub/Shower     Locomotion: PT Ambulation;Stairs     Additional Impairments: OT    SLP Swallowing;Communication;Social Cognition comprehension;expression Social Interaction;Problem Solving;Memory;Attention;Awareness  TR      Anticipated Outcomes Item Anticipated Outcome  Self Feeding Supervision  Swallowing  Supervision    Basic self-care  Supervision  Toileting  Supervision   Bathroom Transfers Min A  Bowel/Bladder  Pt. will be continent with time toletting,with min. assisst.  Transfers  supervision  Locomotion  Supervision  Communication  Min assist   Cognition  Min assist   Pain  less than 3 on scale 1 to 10  Safety/Judgment  pt. will be fall free during the stay on rehab   Therapy Plan: PT Intensity: Minimum of 1-2 x/day ,45 to 90 minutes PT Frequency: 5 out of 7 days PT Duration Estimated Length of Stay: 14-18 days OT Intensity: Minimum of 1-2 x/day, 45 to 90 minutes OT Frequency: 5 out of 7 days OT Duration/Estimated Length of Stay: 15-25 days SLP Intensity: Minumum of 1-2 x/day, 30 to 90 minutes SLP Frequency: 5 out of 7 days SLP Duration/Estimated Length of Stay: 2 weeks       Team Interventions: Nursing Interventions Patient/Family Education;Bladder Management;Bowel Management;Disease Management/Prevention;Pain Management;Discharge Planning;Skin  Care/Wound Management;Medication Management  PT interventions Ambulation/gait training;Balance/vestibular training;Cognitive remediation/compensation;Discharge planning;DME/adaptive equipment instruction;Functional mobility training;Neuromuscular re-education;Patient/family education;Stair training;Therapeutic Activities;Therapeutic Exercise;UE/LE Strength taining/ROM;UE/LE Coordination activities;Visual/perceptual remediation/compensation  OT Interventions Balance/vestibular training;Cognitive remediation/compensation;Discharge planning;DME/adaptive equipment instruction;Functional mobility training;Patient/family education;Neuromuscular re-education;Therapeutic Exercise;Therapeutic Activities;UE/LE Coordination activities;UE/LE Strength taining/ROM  SLP Interventions Cognitive remediation/compensation;Cueing hierarchy;Dysphagia/aspiration precaution training;Internal/external aids;Functional tasks;Environmental controls;Patient/family education;Speech/Language facilitation;Therapeutic Activities  TR Interventions    SW/CM Interventions Discharge Planning;Psychosocial Support;Patient/Family Education    Team Discharge Planning: Destination: PT-Home ,OT- Home , SLP-Home Projected Follow-up: PT-Home health PT;24 hour supervision/assistance, OT-  24 hour supervision/assistance;Home health OT, SLP-Home Health SLP;24 hour supervision/assistance Projected Equipment Needs: PT-Rolling walker with 5" wheels, OT- 3 in 1 bedside comode, SLP-None recommended by SLP Equipment Details: PT- , OT-  Patient/family involved in discharge planning: PT- Patient;Family member/caregiver,  OT-Family member/caregiver, SLP-Family member/caregiver;Patient  MD ELOS: 16-20 days Medical Rehab Prognosis:  Good Assessment: 80 y.o. female history hypertension. Patient independent and active prior to admission. Admitted 01/11/2014 with altered mental status staring to the right not moving her left side per family report. Noted  sodium level 115 and potassium 5.7. MRI of the brain 01/11/2014 showed no convincing evidence to suggest acute ischemia, however there was mild bright signal within the right temporal cortex. Echocardiogram with ejection fraction 60% grade 1 diastolic dysfunction without embolus. EEG was negative for seizure. Carotid Dopplers with no ICA stenosis. Placed on aspirin for CVA prophylaxis. A repeat MRI of the brain 01/14/2014 showed small acute lacunar infarct in the right parietal lobe(posterior right MCA territory) advised to continue aspirin. Patient placed on intravenous fluids of normal saline for hyponatremia. Renal service followup for hyponatremia question element of SIADH and workup ongoing. Urine culture greater than 100,000 gram-negative rods placed on Rocephin. Blood cultures x2 on admission of methicillin  resistant coagulase-negative staph bacteremia with infectious disease consult to Dr. Orvan Falconer and Rocephin was changed to intravenous vancomycin x6 weeks. TEE completed 01/19/2014 showing ejection fraction 70% no evidence of endocarditis without thrombus . There was an atrial septal aneurysm with PFO    Now requiring 24/7 Rehab RN,MD, as well as CIR level PT, OT and SLP.  Treatment team will focus on ADLs and mobility with goals set at [facility]  See Team Conference Notes for weekly updates to the plan of care

## 2014-01-21 NOTE — Progress Notes (Signed)
Occupational Therapy Session Note  Patient Details  Name: Tammy Fernandez MRN: [id number] Date of Birth: 26-Jul-1924  Today's Date: 01/21/2014 Time: 1000-1100 Time Calculation (min): 60 min  Short Term Goals: Week 1:  OT Short Term Goal 1 (Week 1): Patient will complete toilet transfer with mod assist OT Short Term Goal 2 (Week 1): Patient will complete toileitng using BSC with max assist OT Short Term Goal 3 (Week 1): Patient will complete bathing, sitting and standing, using AE prn, with mod assist to maintain balance OT Short Term Goal 4 (Week 1): Patient will complete dressing sitting at edge of bed with min assist  Skilled Therapeutic Interventions/Progress Updates:  Patient resting in w/c upon arrival with 2 NT just leaving the room after providing assistance to patient.  Engaged in self care retraining to include sponge bath at sink, dress, bed transfers and bed mobility.  Focused session on attention to tasks, activity tolerance, task participation, sit><stands, bed mobility and safe w/c><bed transfers.  Patient required max cues to stay on task as she is consistently internally distracted yet is generally able to redirect except when she does not feel she is able to attempt a task, "you know I can't do that", "get my boys in here to do that for me".  Patient also preoccupied by feeling "drunk", "the medicine makes me shake" and"they gave me too much medicine, I'm only supposed to take 1/2 of a pill and they gave me 2".  Patient did not exhibit any shaking during this OT self care session yet continued to perseverate about her shaking.  Patient suggested to get into bed to wash her front perineal area then required coaxing to get back out of bed for her next therapy.  Patient's 2 sons present upon completion.  Therapy Documentation Precautions:  Precautions Precautions: Fall Restrictions Weight Bearing Restrictions: No Pain: Denies pain ADL: See FIM for current functional  status  Therapy/Group: Individual Therapy  Tammy Fernandez 01/21/2014, 12:46 PM

## 2014-01-21 NOTE — Progress Notes (Signed)
Speech Language Pathology Daily Session Note  Patient Details  Name: Tammy Fernandez MRN: [id number] Date of Birth: 1923/12/07  Today's Date: 01/21/2014 Time: 1130-1230 Time Calculation (min): 60 min  Short Term Goals: Week 1: SLP Short Term Goal 1 (Week 1): Patient will consume Dys.2 textures and thin liquids with Max verbal and tactile cues to utilize safe swallow strategies. SLP Short Term Goal 2 (Week 1): Patient will maintain topic of conversation for 3 turns with Mod clinician cues. SLP Short Term Goal 3 (Week 1): Patient will sustain attention to basic self-care task for 30 seconds with Mod verbal and visual cues. SLP Short Term Goal 4 (Week 1): Patient will utilize call bell to request help with Max verbal and visual cues. SLP Short Term Goal 5 (Week 1): Patient will utilize external aids for orientation with Max clinician cues.  Skilled Therapeutic Interventions: Skilled treatment session focused on addressing dysphagia and cognition goals. SLP facilitated session with set-up assist of lunch tray and Mod verbal and tactile cues to utilize small bite and sips as well as a slow rate of self-feeding during intake of Dys.2 textures and nectar-thick liquids.  Patient consumed 50% of meal with no overt s/s of aspiration.  SLP also facilitated session with Max multi-modal cues to sustain attention to functional tasks for 15 second increments.  Patient's internal distractions/verbose verbal expression appeared to have greatest impact on function and direct cues to be quite and focus appeared to minimally expand length of attention to about 30 seconds. Continue with current plan of care.    FIM:  Comprehension Comprehension Mode: Auditory Comprehension: 2-Understands basic 25 - 49% of the time/requires cueing 51 - 75% of the time Expression Expression Mode: Verbal Expression: 3-Expresses basic 50 - 74% of the time/requires cueing 25 - 50% of the time. Needs to repeat parts of  sentences. Social Interaction Social Interaction: 2-Interacts appropriately 25 - 49% of time - Needs frequent redirection. Problem Solving Problem Solving: 2-Solves basic 25 - 49% of the time - needs direction more than half the time to initiate, plan or complete simple activities Memory Memory: 2-Recognizes or recalls 25 - 49% of the time/requires cueing 51 - 75% of the time FIM - Eating Eating Activity: 5: Set-up assist for open containers;5: Set-up assist for cut food;4: Helper checks for pocketed food;4: Help with picking up utensils;4: Help with managing cup/glass;5: Needs verbal cues/supervision  Pain Pain Assessment Pain Assessment: No/denies pain  Therapy/Group: Individual Therapy  Charlane FerrettiMelissa Ladarrion Telfair, M.A., CCC-SLP [phone]  Thania Woodlief 01/21/2014, 1:09 PM

## 2014-01-21 NOTE — Progress Notes (Addendum)
Physical Therapy Session Note  Patient Details  Name: Tammy Fernandez MRN: [id number] Date of Birth: Aug 13, 1924  Today's Date: 01/21/2014 Time: [id number] Time Calculation (min): 57 min  Short Term Goals: Week 1:  PT Short Term Goal 1 (Week 1): Pt will perform bed mobility to L and R on flat bed with mod A and 75% verbal cues for sequencing PT Short Term Goal 2 (Week 1): Pt will perform bed <> w/c transfers with consistent mod A and 75% cues for sequencing PT Short Term Goal 3 (Week 1): Pt will perform gait x 50' in controlled environment with mod A and 75% cues for safety/attention PT Short Term Goal 4 (Week 1): Pt will perform stair negotiation up/down 4 stairs with bilat rails and mod A and 75% cues for sequencing/safety  Skilled Therapeutic Interventions/Progress Updates:   Pt received lying in bed this morning, receiving meds from RN.  Pt states she needs to use the restroom, therefore following getting pills, performed supine to sit at min assist with HOB elevated and with use of handrail.  Provided mod to max verbal cues for attending to task, as she would get easily internally distracted.  Once seated at EOB, performed standing to RW at mod assist to ambulate to restroom with min/mod assist.  Provided max verbal cues for navigating to restroom and manual assist to steer RW.  Pt continues to perseverate on being nervous and needing her pill.  Requires min to mod cues to re-direct to task at hand.  Assisted pt with adjusting brief prior to and following toileting and also to perform peri care.  Ambulated to sink with min/mod assist and max verbal cues to attend to task.  Provided questioning cues for "what do we need to wash hands."  Pt unable to state sequence in which to wash hands, therefore provided total assist cues for turning on water, retrieving soap for hands, getting paper towels, and then turning water off.  Assisted pt to therapy gym via w/c at total assist level in order to  participate in standing activity for increasing balance, activity tolerance while performing cognitive task of identifying number or letter then stating a word that begins with that letter.  Pt able to perform identifying object 100% of time out of field of five, however when given field of 7 with both numbers and letters, but able to perform approx 50% of time and requires max verbal questioning cues and cues to attend to task and to remember which letter/number she was supposed to identify.  She continues to perseverate on "10" and "all of them" this morning.  Attempted to have pt ambulate short distance in gym, however she kept stating "you know I can't, I don't know why you would ask me to when you know I can't."  Assisted pt back to room and was left in room with quick release belt donned and son in room to assist with needs.   Therapy Documentation Precautions:  Precautions Precautions: Fall Restrictions Weight Bearing Restrictions: No   Vital Signs: Therapy Vitals Pulse Rate: 91 BP: 164/88 mmHg Pain: no stated pain this morning.      See FIM for current functional status  Therapy/Group: Individual Therapy  Vista Deckarcell, Katori Wirsing Ann 01/21/2014, 12:21 PM

## 2014-01-22 ENCOUNTER — Inpatient Hospital Stay (HOSPITAL_COMMUNITY): Payer: Medicare Other | Admitting: Rehabilitation

## 2014-01-22 ENCOUNTER — Inpatient Hospital Stay (HOSPITAL_COMMUNITY): Payer: PRIVATE HEALTH INSURANCE

## 2014-01-22 ENCOUNTER — Inpatient Hospital Stay (HOSPITAL_COMMUNITY): Payer: Medicare Other | Admitting: Occupational Therapy

## 2014-01-22 ENCOUNTER — Encounter (HOSPITAL_COMMUNITY): Payer: Medicare Other | Admitting: Occupational Therapy

## 2014-01-22 DIAGNOSIS — I69991 Dysphagia following unspecified cerebrovascular disease: Secondary | ICD-10-CM

## 2014-01-22 DIAGNOSIS — A4902 Methicillin resistant Staphylococcus aureus infection, unspecified site: Secondary | ICD-10-CM

## 2014-01-22 DIAGNOSIS — R7881 Bacteremia: Secondary | ICD-10-CM

## 2014-01-22 DIAGNOSIS — R5381 Other malaise: Secondary | ICD-10-CM

## 2014-01-22 DIAGNOSIS — G9341 Metabolic encephalopathy: Secondary | ICD-10-CM

## 2014-01-22 DIAGNOSIS — I6350 Cerebral infarction due to unspecified occlusion or stenosis of unspecified cerebral artery: Secondary | ICD-10-CM

## 2014-01-22 MED ORDER — POLYETHYLENE GLYCOL 3350 17 G PO PACK
17.0000 g | PACK | Freq: Every day | ORAL | Status: DC
  Administered 2014-01-22 – 2014-01-29 (×8): 17 g via ORAL
  Filled 2014-01-22 (×9): qty 1

## 2014-01-22 MED ORDER — ALPRAZOLAM 0.25 MG PO TABS
0.2500 mg | ORAL_TABLET | Freq: Two times a day (BID) | ORAL | Status: DC
  Administered 2014-01-22 – 2014-01-24 (×5): 0.25 mg via ORAL
  Filled 2014-01-22 (×5): qty 1

## 2014-01-22 NOTE — Progress Notes (Signed)
Physical Therapy Session Note  Patient Details  Name: Tammy Fernandez MRN: [id number] Date of Birth: 1924/03/14  Today's Date: 01/22/2014 Time: 1000-1057 Time Calculation (min): 57 min  Short Term Goals: Week 1:  PT Short Term Goal 1 (Week 1): Pt will perform bed mobility to L and R on flat bed with mod A and 75% verbal cues for sequencing PT Short Term Goal 2 (Week 1): Pt will perform bed <> w/c transfers with consistent mod A and 75% cues for sequencing PT Short Term Goal 3 (Week 1): Pt will perform gait x 50' in controlled environment with mod A and 75% cues for safety/attention PT Short Term Goal 4 (Week 1): Pt will perform stair negotiation up/down 4 stairs with bilat rails and mod A and 75% cues for sequencing/safety  Skilled Therapeutic Interventions/Progress Updates:   Pt received sitting in w/c, having just finished OT session.  Per OT, IV RN to come during session to flush PICC line, therefore ambulated from her room towards gym.  Pt tolerated approx 70' x1, 30' x 1, and 20' x 1 of gait with use of RW at min assist with cues for attending to the L and maintaining straight alignment while ambulating.  Pt continues to converse during gait and had increased difficulty with attention to questions being asked from by therapist.  She was unable to state what things are different since having a stroke (she was able to [facility] and that she had had a small stroke).  Pt had to take two seated rest breaks before getting into gym due to her "nerves" and "short of breath."  SaO2 remained in 90s throughout on RA.  Once in gym focused on standing balance, tolerance while performing cognitive task to address attention, memory, sorting, and problem solving.  Placed several denominations of dollar bills in front to the L while pt stood and located amount called out by therapist.  She was able to perform approx 30% of time, however requires max to total assist cues to recall amount needed  following approx 20 secs.  During standing, she kept perseverating on her L leg and the "disc" in her back and fear of falling.  Requires max assist cues to re-direct throughout entire session.  Family present during first part of session, however became very distracted by them as they were arguing and talking over each other, therefore therapist asked family to step out during this session.   Ended with attempted game of connect four, however pt states she "doesn't play games, its a sin."  Did get her to place several chips, however she was unable to process rules of game and perseverated on number "four."  Pt ambulated 2 reps of approx 30' as stated above back towards room, but was too fatigued to ambulate remainder of distance.  Pt returned to room and left in chair with quick release belt donned and all needs in reach.  Family in room.   Therapy Documentation Precautions:  Precautions Precautions: Fall Precaution Comments: increased anxiety, decreased selective attention Restrictions Weight Bearing Restrictions: No   Pain: Pain Assessment Pain Assessment: Faces Pain Score: 2  Faces Pain Scale: Hurts a little bit Pain Type: Acute pain Pain Location: Back Pain Intervention(s): Repositioned;Emotional support Multiple Pain Sites: No   Locomotion : Ambulation Ambulation/Gait Assistance: 4: Min assist   See FIM for current functional status  Therapy/Group: Individual Therapy  Vista Deckarcell, Alyna Stensland Ann 01/22/2014, 12:23 PM

## 2014-01-22 NOTE — Progress Notes (Addendum)
Subjective/Complaints: 79 y.o. female history hypertension. Patient independent and active prior to admission. Admitted 01/11/2014 with altered mental status staring to the right not moving her left side per family report. Noted sodium level 115 and potassium 5.7. MRI of the brain 01/11/2014 showed no convincing evidence to suggest acute ischemia, however there was mild bright signal within the right temporal cortex. Echocardiogram with ejection fraction 34% grade 1 diastolic dysfunction without embolus. EEG was negative for seizure. Carotid Dopplers with no ICA stenosis. Placed on aspirin for CVA prophylaxis. A repeat MRI of the brain 01/14/2014 showed small acute lacunar infarct in the right parietal lobe(posterior right MCA territory) advised to continue aspirin. Patient placed on intravenous fluids of normal saline for hyponatremia. Renal service followup for hyponatremia question element of SIADH and workup ongoing. Urine culture greater than 100,000 gram-negative rods placed on Rocephin. Blood cultures x2 on admission of methicillin resistant coagulase-negative staph bacteremia with infectious disease consult to Dr. Megan Salon and Rocephin was changed to intravenous vancomycin x6 weeks. TEE completed 01/19/2014 showing ejection fraction 70% no evidence of endocarditis without thrombus . There was an atrial septal aneurysm with PFO. Venous Doppler studies lower extremities negative  Slept better , refused seroquel, c/o constipation Only takes .25 Xanax at home, .5 "makes me drunk" Objective: Vital Signs: Blood pressure 133/51, pulse 65, temperature 97.8 F (36.6 C), temperature source Oral, resp. rate 18, weight 85.8 kg (189 lb 2.5 oz), SpO2 100.00%. No results found. Results for orders placed during the hospital encounter of 01/19/14 (from the past 72 hour(s))  CBC WITH DIFFERENTIAL     Status: Abnormal   Collection Time    01/20/14  6:40 AM      Result Value Ref Range   WBC 11.2 (*) 4.0 - 10.5  K/uL   RBC 4.09  3.87 - 5.11 MIL/uL   Hemoglobin 12.3  12.0 - 15.0 g/dL   HCT 38.1  36.0 - 46.0 %   MCV 93.2  78.0 - 100.0 fL   MCH 30.1  26.0 - 34.0 pg   MCHC 32.3  30.0 - 36.0 g/dL   RDW 14.0  11.5 - 15.5 %   Platelets 274  150 - 400 K/uL   Neutrophils Relative % 59  43 - 77 %   Neutro Abs 6.5  1.7 - 7.7 K/uL   Lymphocytes Relative 27  12 - 46 %   Lymphs Abs 3.0  0.7 - 4.0 K/uL   Monocytes Relative 10  3 - 12 %   Monocytes Absolute 1.1 (*) 0.1 - 1.0 K/uL   Eosinophils Relative 5  0 - 5 %   Eosinophils Absolute 0.5  0.0 - 0.7 K/uL   Basophils Relative 0  0 - 1 %   Basophils Absolute 0.0  0.0 - 0.1 K/uL  COMPREHENSIVE METABOLIC PANEL     Status: Abnormal   Collection Time    01/20/14  6:40 AM      Result Value Ref Range   Sodium 137  137 - 147 mEq/L   Potassium 3.3 (*) 3.7 - 5.3 mEq/L   Chloride 89 (*) 96 - 112 mEq/L   CO2 40 (*) 19 - 32 mEq/L   Comment: REPEATED TO VERIFY     CRITICAL RESULT CALLED TO, READ BACK BY AND VERIFIED WITH:     V.WASHINGTON,RN [id number] 01/20/14 CLARK,S   Glucose, Bld 99  70 - 99 mg/dL   BUN 25 (*) 6 - 23 mg/dL   Creatinine, Ser 0.62  0.50 - 1.10  mg/dL   Calcium 10.5  8.4 - 10.5 mg/dL   Total Protein 6.9  6.0 - 8.3 g/dL   Albumin 2.9 (*) 3.5 - 5.2 g/dL   AST 56 (*) 0 - 37 U/L   ALT 44 (*) 0 - 35 U/L   Alkaline Phosphatase 80  39 - 117 U/L   Total Bilirubin 0.7  0.3 - 1.2 mg/dL   GFR calc non Af Amer 79 (*) >90 mL/min   GFR calc Af Amer >90  >90 mL/min   Comment: (NOTE)     The eGFR has been calculated using the CKD EPI equation.     This calculation has not been validated in all clinical situations.     eGFR's persistently <90 mL/min signify possible Chronic Kidney     Disease.  BASIC METABOLIC PANEL     Status: Abnormal   Collection Time    01/20/14 11:35 AM      Result Value Ref Range   Sodium 135 (*) 137 - 147 mEq/L   Potassium 3.2 (*) 3.7 - 5.3 mEq/L   Chloride 91 (*) 96 - 112 mEq/L   CO2 39 (*) 19 - 32 mEq/L   Glucose, Bld 113 (*) 70 -  99 mg/dL   BUN 25 (*) 6 - 23 mg/dL   Creatinine, Ser 0.64  0.50 - 1.10 mg/dL   Calcium 9.7  8.4 - 10.5 mg/dL   GFR calc non Af Amer 78 (*) >90 mL/min   GFR calc Af Amer >90  >90 mL/min   Comment: (NOTE)     The eGFR has been calculated using the CKD EPI equation.     This calculation has not been validated in all clinical situations.     eGFR's persistently <90 mL/min signify possible Chronic Kidney     Disease.  VANCOMYCIN, TROUGH     Status: None   Collection Time    01/21/14  5:25 AM      Result Value Ref Range   Vancomycin Tr 13.0  10.0 - 20.0 ug/mL      General: No acute distress Mood and affect are inappropriate Heart: Regular rate and rhythm no rubs murmurs or extra sounds Lungs: Clear to auscultation, breathing unlabored, no rales or wheezes Abdomen:sluggish bowel sounds, soft nontender to palpation, nondistended Extremities: No clubbing, cyanosis, or edema Skin: No evidence of breakdown, no evidence of rash Neurologic: Cranial nerves II through XII intact, motor strength is 4/5 in bilateral deltoid, bicep, tricep, grip, hip flexor, knee extensors, ankle dorsiflexor and plantar flexor  Musculoskeletal: Full range of motion in all 4 extremities. No joint swelling Oriented to person and place, not time unsure of day or night but inappropriate responses to sensory exam   Assessment/Plan: 1. Functional deficits secondary to small parietal infarct with metabolic encephalopathy which require 3+ hours per day of interdisciplinary therapy in a comprehensive inpatient rehab setting. Physiatrist is providing close team supervision and 24 hour management of active medical problems listed below. Physiatrist and rehab team continue to assess barriers to discharge/monitor patient progress toward functional and medical goals.  FIM: FIM - Bathing Bathing Steps Patient Completed: Chest;Right Arm;Left Arm;Abdomen Bathing: 2: Max-Patient completes 3-4 29f10 parts or 25-49% (sit at sink  and bed level for peri area and buttocks)  FIM - Upper Body Dressing/Undressing Upper body dressing/undressing steps patient completed: Put head through opening of pull over shirt/dress (house dress) Upper body dressing/undressing: 2: Max-Patient completed 25-49% of tasks FIM - Lower Body Dressing/Undressing Lower body dressing/undressing  steps patient completed: Thread/unthread left pants leg Lower body dressing/undressing: 0: Activity did not occur  FIM - Toileting Toileting steps completed by patient: Performs perineal hygiene Toileting: 2: Max-Patient completed 1 of 3 steps  FIM - Radio producer Devices: Elevated toilet seat;Grab bars;Walker Toilet Transfers: 3-To toilet/BSC: Mod A (lift or lower assist);3-From toilet/BSC: Mod A (lift or lower assist)  FIM - Bed/Chair Transfer Bed/Chair Transfer Assistive Devices: Bed rails;Arm rests Bed/Chair Transfer: 3: Sit > Supine: Mod A (lifting assist/Pt. 50-74%/lift 2 legs);4: Supine > Sit: Min A (steadying Pt. > 75%/lift 1 leg);4: Chair or W/C > Bed: Min A (steadying Pt. > 75%);4: Bed > Chair or W/C: Min A (steadying Pt. > 75%)  FIM - Locomotion: Wheelchair Distance: 150 total A Locomotion: Wheelchair: 1: Total Assistance/staff pushes wheelchair (Pt<25%) FIM - Locomotion: Ambulation Locomotion: Ambulation Assistive Devices: Administrator Ambulation/Gait Assistance: 3: Mod assist;4: Min assist Locomotion: Ambulation: 0: Activity did not occur  Comprehension Comprehension Mode: Auditory Comprehension: 2-Understands basic 25 - 49% of the time/requires cueing 51 - 75% of the time  Expression Expression Mode: Verbal Expression: 3-Expresses basic 50 - 74% of the time/requires cueing 25 - 50% of the time. Needs to repeat parts of sentences.  Social Interaction Social Interaction: 2-Interacts appropriately 25 - 49% of time - Needs frequent redirection.  Problem Solving Problem Solving: 2-Solves basic 25 -  49% of the time - needs direction more than half the time to initiate, plan or complete simple activities  Memory Memory: 2-Recognizes or recalls 25 - 49% of the time/requires cueing 51 - 75% of the time  Medical Problem List and Plan:  1. Small acute lacunar infarct/sepsis/metabolic encephalopathy with disrupted sleep /wake cycles ,reduce xanax and monitor2. DVT Prophylaxis/Anticoagulation: SCDs. Venous Dopplers negative  3. Pain Management: Tylenol as needed.  4. Mood/anxiety. Xanax 0.5 mg each bedtime as needed. Provide emotional support  5. Neuropsych: This patient is not capable of making decisions on her own behalf.  6. ID. Methicillin resistant coagulase-negative staph bacteremia. Continue IV vancomycin x6 weeks  7. Dysphagia. Dysphagia 3 thin liquids. Followup speech therapy  8.Hyponatremia. Resolved with latest sodium 137/Follow up labs , hypercarbic will stop O2, sats 100% recheck BMET 9. Hypertension. Lopressor 25 mg twice a day. Monitor with increased mobility  10. Hypothyroidism. Synthroid. Latest TSH is 0.187   LOS (Days) 3 A FACE TO FACE EVALUATION WAS PERFORMED  Chilton Sallade E 01/22/2014, 6:58 AM

## 2014-01-22 NOTE — Progress Notes (Signed)
Occupational Therapy Session Note  Patient Details  Name: Tammy BeachRosa L Scalici MRN: [id number] Date of Birth: [birth year]  Today's Date: 01/22/2014 Time: [id number] Time Calculation (min): 31 min  Skilled Therapeutic Interventions/Progress Updates:    Pt taken down to the ADL apartment.  Worked with pt on bed transfers and toilet transfers to begin session.  Pt very internally distracted stating she hasn't had her medicine and she needs her eye drops.  Feel she does better by not addressing her statements and just telling her what you want her to do.  If you engage in her complaints she gets more distracted and anxious.  Pt able to transfer from wheelchiar to EOB with min assist using the RW and mod instructional cueing for hand placement with sit to stand.  She tends to try and pull up on the walker instead of pushing from the surface she is sitting on.  Ms. Bridgett LarssonGodbolt was able to ambulate to the toilet in the bathroom with min assist as well but needs max instructional cueing to stay in closer to the walker as she tends to push it away from her.  Finished session by working on lateral neck stretches to the left side in sitting.  Therapist performed sustained stretching to SCLM on the left and also had pt push against therapist's hand on the right side of her head and then have her relax.  Pt with slight increased in AROM lateral flexion to the left by 10-15 degrees (estimated).  Pt tends to keep her head slightly flexed and tilted to the right side in sitting or when standing.  Encouraged family to take note of her head position and help influence her to straighten her head if it is tilted to the right side.  Therapy Documentation Precautions:  Precautions Precautions: Fall Precaution Comments: increased anxiety, decreased selective attention Restrictions Weight Bearing Restrictions: No  Pain: Pain Assessment Pain Assessment: 0-10 Pain Score: 0-No pain ADL: ADL ADL Comments: see FIM  See FIM for  current functional status  Therapy/Group: Individual Therapy  Alixandra Alfieri OTR/L 01/22/2014, 3:56 PM

## 2014-01-22 NOTE — Progress Notes (Addendum)
Speech Language Pathology Daily Session Note  Patient Details  Name: Tammy Fernandez MRN: [id number] Date of Birth: [birth year]  Today's Date: 01/22/2014 Time: 1000-1057 Time Calculation (min): 57 min  Short Term Goals: Week 1: SLP Short Term Goal 1 (Week 1): Patient will consume Dys.2 textures and thin liquids with Max verbal and tactile cues to utilize safe swallow strategies. SLP Short Term Goal 1 - Progress (Week 1): Progressing toward goal SLP Short Term Goal 2 (Week 1): Patient will maintain topic of conversation for 3 turns with Mod clinician cues. SLP Short Term Goal 2 - Progress (Week 1): Progressing toward goal SLP Short Term Goal 3 (Week 1): Patient will sustain attention to basic self-care task for 30 seconds with Mod verbal and visual cues. SLP Short Term Goal 3 - Progress (Week 1): Progressing toward goal SLP Short Term Goal 4 (Week 1): Patient will utilize call bell to request help with Max verbal and visual cues. SLP Short Term Goal 4 - Progress (Week 1): Progressing toward goal SLP Short Term Goal 5 (Week 1): Patient will utilize external aids for orientation with Max clinician cues. SLP Short Term Goal 5 - Progress (Week 1): Progressing toward goal  Skilled Therapeutic Interventions: verbal, tactile,visual cues, family education, attention tasks, basic problem solving tasks   FIM:  Comprehension Comprehension Mode: Auditory Comprehension: 2-Understands basic 25 - 49% of the time/requires cueing 51 - 75% of the time Expression Expression Mode: Verbal Expression: 3-Expresses basic 50 - 74% of the time/requires cueing 25 - 50% of the time. Needs to repeat parts of sentences. Social Interaction Social Interaction: 2-Interacts appropriately 25 - 49% of time - Needs frequent redirection. Problem Solving Problem Solving: 2-Solves basic 25 - 49% of the time - needs direction more than half the time to initiate, plan or complete simple activities Memory Memory: 2-Recognizes  or recalls 25 - 49% of the time/requires cueing 51 - 75% of the time FIM - Eating Eating Activity: 4: Helper checks for pocketed food;5: Set-up assist for open containers  Pain Pain Assessment Pain Assessment: No/denies pain Pain Score: 2  Faces Pain Scale: Hurts a little bit Pain Type: Acute pain Pain Location: Back Pain Intervention(s): Repositioned;Emotional support Multiple Pain Sites: No  Therapy/Group: Individual Therapy  Skilled therapeutic intervention included addressing dysphagia and cognition goals.  Facilitation of session by SLP completed by aiding pt with set-up and providing moderate verbal, visual cues to assure pt not pocketing on left side for maximal airway protection during intake of Dys2/thin.   Pt with very poor intake stating "Do you want me to choke?" while consuming fish- pt effectively cleared sensation with liquid consumption. No s/s of aspiration during entire meal.  SLP educated pt/daughter/granddaughter to clinical reasoning for aspiration precautions and compensation strategies using teach back with pt.  Per daughter, pt's intake at home was poor even prior to admission.  Pt enjoys icecream and SLP inquired to RN re: possible RD referral and/or magic cup supplementation.    SLP also facilitated session re: basic problem solving with max assist to wash hands before eating, locate call bell, etc.  Pt is loquacious and internally distracted by throat discomfort, headache, constipation etc  - benefiting from SLP providing max cues to attend to functional tasks for 15 seconds.  Family reports pt with significant vision deficits impacting her ability to locate utensils and call bell.  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      Pt was oriented to place and situation today, reports she does not keep up with dates at home prior to admission.  Again, pt's  verbosity/distractions continue to impact her function -will continue to provide verbal cueing to maximize abilities.  Continue current goals of care.   Donavan Burnet, MS [facility] SLP [phone]

## 2014-01-22 NOTE — Progress Notes (Signed)
Occupational Therapy Session Note  Patient Details  Name: Tammy Fernandez MRN: [id number] Date of Birth: [birth year]  Today's Date: 01/22/2014 Time: 0901-1002 Time Calculation (min): 61 min  Short Term Goals: Week 1:  OT Short Term Goal 1 (Week 1): Patient will complete toilet transfer with mod assist OT Short Term Goal 2 (Week 1): Patient will complete toileitng using BSC with max assist OT Short Term Goal 3 (Week 1): Patient will complete bathing, sitting and standing, using AE prn, with mod assist to maintain balance OT Short Term Goal 4 (Week 1): Patient will complete dressing sitting at edge of bed with min assist  Skilled Therapeutic Interventions/Progress Updates:    Pt able to transfer from supine to sit during session with mod assist and HOB elevated.  Transferred to the wheelchair using the RW for support with min assist and mod instructional cueing to not pull up on the walker.  Pt still perseverating on not getting her allergy medicine and needing to go to the bathroom but not being able.  Performed bathing with mod instructional cueing and re-direction as pt gets internally distracted.  Pt's granddaughter also present for session as well and offered encouragement and re-direction for pt to continue bathing task.  Pt needed mod assist to cross and maintain her LEs over each knee for washing her feet and donning her gripper socks.  She was unable to donn the left one but could the right.  She also needed assistance with donning her gown on over her head.  Pt still with severe anxiety during session as well.  O2 sats monitored at 90-93% during session on room air.    Therapy Documentation Precautions:  Precautions Precautions: Fall Precaution Comments: increased anxiety, decreased selective attention Restrictions Weight Bearing Restrictions: No  Vital Signs: Therapy Vitals Pulse Rate: 68 BP: 136/62 mmHg Pain: Pain Assessment Pain Assessment: Faces Faces Pain Scale: Hurts a  little bit Pain Type: Acute pain Pain Location: Back Pain Intervention(s): Repositioned;Emotional support Multiple Pain Sites: No ADL: ADL ADL Comments: see FIM  See FIM for current functional status  Therapy/Group: Individual Therapy  Yeslin Delio OTR/L  01/22/2014, 11:14 AM

## 2014-01-23 ENCOUNTER — Inpatient Hospital Stay (HOSPITAL_COMMUNITY): Payer: PRIVATE HEALTH INSURANCE | Admitting: Physical Therapy

## 2014-01-23 DIAGNOSIS — R7881 Bacteremia: Secondary | ICD-10-CM

## 2014-01-23 DIAGNOSIS — R5381 Other malaise: Secondary | ICD-10-CM

## 2014-01-23 DIAGNOSIS — G9341 Metabolic encephalopathy: Secondary | ICD-10-CM

## 2014-01-23 DIAGNOSIS — I69991 Dysphagia following unspecified cerebrovascular disease: Secondary | ICD-10-CM

## 2014-01-23 DIAGNOSIS — A4902 Methicillin resistant Staphylococcus aureus infection, unspecified site: Secondary | ICD-10-CM

## 2014-01-23 DIAGNOSIS — I6350 Cerebral infarction due to unspecified occlusion or stenosis of unspecified cerebral artery: Secondary | ICD-10-CM

## 2014-01-23 MED ORDER — METOPROLOL TARTRATE 25 MG PO TABS
37.5000 mg | ORAL_TABLET | Freq: Two times a day (BID) | ORAL | Status: DC
  Administered 2014-01-23 – 2014-01-26 (×6): 37.5 mg via ORAL
  Filled 2014-01-23 (×8): qty 1

## 2014-01-23 MED ORDER — BISACODYL 10 MG RE SUPP
10.0000 mg | Freq: Every day | RECTAL | Status: DC | PRN
  Administered 2014-01-23: 10 mg via RECTAL
  Filled 2014-01-23 (×2): qty 1

## 2014-01-23 NOTE — Progress Notes (Signed)
Subjective/Complaints: 80 y.o. female history hypertension. Patient independent and active prior to admission. Admitted 01/11/2014 with altered mental status staring to the right not moving her left side per family report. Noted sodium level 115 and potassium 5.7. MRI of the brain 01/11/2014 showed no convincing evidence to suggest acute ischemia, however there was mild bright signal within the right temporal cortex. Echocardiogram with ejection fraction 16% grade 1 diastolic dysfunction without embolus. EEG was negative for seizure. Carotid Dopplers with no ICA stenosis. Placed on aspirin for CVA prophylaxis. A repeat MRI of the brain 01/14/2014 showed small acute lacunar infarct in the right parietal lobe(posterior right MCA territory) advised to continue aspirin. Patient placed on intravenous fluids of normal saline for hyponatremia. Renal service followup for hyponatremia question element of SIADH and workup ongoing. Urine culture greater than 100,000 gram-negative rods placed on Rocephin. Blood cultures x2 on admission of methicillin resistant coagulase-negative staph bacteremia with infectious disease consult to Dr. Megan Salon and Rocephin was changed to intravenous vancomycin x6 weeks. TEE completed 01/19/2014 showing ejection fraction 70% no evidence of endocarditis without thrombus . There was an atrial septal aneurysm with PFO. Venous Doppler studies lower extremities negative  Concerned about high bps. It's increasing her anxiety also Only takes .25 Xanax at home, .5 "makes me drunk" Objective: Vital Signs: Blood pressure 168/80, pulse 80, temperature 97.9 F (36.6 C), temperature source Oral, resp. rate 17, weight 85.8 kg (189 lb 2.5 oz), SpO2 92.00%. No results found. Results for orders placed during the hospital encounter of 01/19/14 (from the past 72 hour(s))  BASIC METABOLIC PANEL     Status: Abnormal   Collection Time    01/20/14 11:35 AM      Result Value Ref Range   Sodium 135 (*) 137  - 147 mEq/L   Potassium 3.2 (*) 3.7 - 5.3 mEq/L   Chloride 91 (*) 96 - 112 mEq/L   CO2 39 (*) 19 - 32 mEq/L   Glucose, Bld 113 (*) 70 - 99 mg/dL   BUN 25 (*) 6 - 23 mg/dL   Creatinine, Ser 0.64  0.50 - 1.10 mg/dL   Calcium 9.7  8.4 - 10.5 mg/dL   GFR calc non Af Amer 78 (*) >90 mL/min   GFR calc Af Amer >90  >90 mL/min   Comment: (NOTE)     The eGFR has been calculated using the CKD EPI equation.     This calculation has not been validated in all clinical situations.     eGFR's persistently <90 mL/min signify possible Chronic Kidney     Disease.  VANCOMYCIN, TROUGH     Status: None   Collection Time    01/21/14  5:25 AM      Result Value Ref Range   Vancomycin Tr 13.0  10.0 - 20.0 ug/mL      General: No acute distress Mood and affect are inappropriate Heart: Regular rate and rhythm no rubs murmurs or extra sounds Lungs: Clear to auscultation, breathing unlabored, no rales or wheezes Abdomen:sluggish bowel sounds, soft nontender to palpation, nondistended Extremities: No clubbing, cyanosis, or edema Skin: No evidence of breakdown, no evidence of rash Neurologic: Cranial nerves II through XII intact, motor strength is 4/5 in bilateral deltoid, bicep, tricep, grip, hip flexor, knee extensors, ankle dorsiflexor and plantar flexor  Musculoskeletal: Full range of motion in all 4 extremities. No joint swelling Oriented to person and place, not time unsure of day or night but inappropriate responses to sensory exam  Psych: anxious, hyperventilating a  little  Assessment/Plan: 1. Functional deficits secondary to small parietal infarct with metabolic encephalopathy which require 3+ hours per day of interdisciplinary therapy in a comprehensive inpatient rehab setting. Physiatrist is providing close team supervision and 24 hour management of active medical problems listed below. Physiatrist and rehab team continue to assess barriers to discharge/monitor patient progress toward functional  and medical goals.  FIM: FIM - Bathing Bathing Steps Patient Completed: Chest;Right Arm;Left Arm;Abdomen;Front perineal area;Right upper leg;Left upper leg Bathing: 3: Mod-Patient completes 5-7 54f10 parts or 50-74%  FIM - Upper Body Dressing/Undressing Upper body dressing/undressing steps patient completed: Thread/unthread left sleeve of pullover shirt/dress;Thread/unthread right sleeve of pullover shirt/dresss Upper body dressing/undressing: 4: Min-Patient completed 75 plus % of tasks FIM - Lower Body Dressing/Undressing Lower body dressing/undressing steps patient completed: Don/Doff left sock Lower body dressing/undressing: 3: Mod-Patient completed 50-74% of tasks  FIM - Toileting Toileting steps completed by patient: Performs perineal hygiene Toileting: 2: Max-Patient completed 1 of 3 steps  FIM - TRadio producerDevices: Elevated toilet seat;Grab bars;Walker Toilet Transfers: 3-To toilet/BSC: Mod A (lift or lower assist);3-From toilet/BSC: Mod A (lift or lower assist)  FIM - Bed/Chair Transfer Bed/Chair Transfer Assistive Devices: Bed rails;Arm rests Bed/Chair Transfer: 4: Chair or W/C > Bed: Min A (steadying Pt. > 75%)  FIM - Locomotion: Wheelchair Distance: 150 total A Locomotion: Wheelchair: 1: Total Assistance/staff pushes wheelchair (Pt<25%) FIM - Locomotion: Ambulation Locomotion: Ambulation Assistive Devices: WAdministratorAmbulation/Gait Assistance: 4: Min assist Locomotion: Ambulation: 2: Travels 50 - 149 ft with minimal assistance (Pt.>75%)  Comprehension Comprehension Mode: Auditory Comprehension: 2-Understands basic 25 - 49% of the time/requires cueing 51 - 75% of the time  Expression Expression Mode: Verbal Expression: 3-Expresses basic 50 - 74% of the time/requires cueing 25 - 50% of the time. Needs to repeat parts of sentences.  Social Interaction Social Interaction: 2-Interacts appropriately 25 - 49% of time - Needs  frequent redirection.  Problem Solving Problem Solving: 2-Solves basic 25 - 49% of the time - needs direction more than half the time to initiate, plan or complete simple activities  Memory Memory: 2-Recognizes or recalls 25 - 49% of the time/requires cueing 51 - 75% of the time  Medical Problem List and Plan:  1. Small acute lacunar infarct/sepsis/metabolic encephalopathy with disrupted sleep /wake cycles ,reduce xanax and monitor2. DVT Prophylaxis/Anticoagulation: SCDs. Venous Dopplers negative  3. Pain Management: Tylenol as needed.  4. Mood/anxiety. Xanax 0.25 mg bid. Encouraged pt to work on deep, slow breathing to help control anxiety.  -encouraged her that we will work on her HTN  Team to Provide emotional support ongoing 5. Neuropsych: This patient is not capable of making decisions on her own behalf.  6. ID. Methicillin resistant coagulase-negative staph bacteremia. Continue IV vancomycin x6 weeks  7. Dysphagia. Dysphagia 3 thin liquids. Followup speech therapy  8.Hyponatremia. Resolved with latest sodium 137/Follow up labs , hypercarbic will stop O2, sats 100% recheck BMET 9. Hypertension. Lopressor 25 mg twice a day. Increase to 37.583m10. Hypothyroidism. Synthroid. Latest TSH is 0.187   LOS (Days) 4 A FACE TO FACE EVALUATION WAS PERFORMED  SWARTZ,ZACHARY T 01/23/2014, 9:11 AM

## 2014-01-23 NOTE — Progress Notes (Signed)
Physical Therapy Note  Patient Details  Name: Tammy Fernandez MRN: [id number] Date of Birth: [birth year] Today's Date: 01/23/2014  1430 -1520 (50 minutes) individual Pain : no reported pain Focus of treatment: bed mobility training; gait training Treatment: pt in bed upon arrival; pt required encouragement to participate - "My eyes are watering"; supine to sit mod assist ; sit to stand min assist with max vcs for hand placement; transfer stand/turn RW min assist ("put a belt around me since I might fall " ); gait 66 feet X 2 RW with min assist to guide assistive (steers to right); sit to supine (mat) min assist; supine to sit (mat) min/mod assist trunk; returned to bed with bed alarm activated and all needs within reach; family present.    Laurita Peron,JIM 01/23/2014, 3:20 PM

## 2014-01-24 ENCOUNTER — Inpatient Hospital Stay (HOSPITAL_COMMUNITY): Payer: PRIVATE HEALTH INSURANCE | Admitting: Occupational Therapy

## 2014-01-24 ENCOUNTER — Inpatient Hospital Stay (HOSPITAL_COMMUNITY): Payer: PRIVATE HEALTH INSURANCE | Admitting: Physical Therapy

## 2014-01-24 ENCOUNTER — Inpatient Hospital Stay (HOSPITAL_COMMUNITY): Payer: PRIVATE HEALTH INSURANCE | Admitting: *Deleted

## 2014-01-24 LAB — CULTURE, BLOOD (ROUTINE X 2): Culture: NO GROWTH

## 2014-01-24 MED ORDER — ALPRAZOLAM 0.5 MG PO TABS
0.5000 mg | ORAL_TABLET | Freq: Three times a day (TID) | ORAL | Status: DC
  Administered 2014-01-24 – 2014-01-29 (×15): 0.5 mg via ORAL
  Filled 2014-01-24 (×15): qty 1

## 2014-01-24 MED ORDER — DIPHENHYDRAMINE HCL 25 MG PO CAPS
25.0000 mg | ORAL_CAPSULE | Freq: Three times a day (TID) | ORAL | Status: DC | PRN
  Administered 2014-01-24 – 2014-01-28 (×3): 25 mg via ORAL
  Filled 2014-01-24 (×6): qty 1

## 2014-01-24 MED ORDER — MENTHOL 3 MG MT LOZG
1.0000 | LOZENGE | OROMUCOSAL | Status: DC | PRN
  Filled 2014-01-24: qty 9

## 2014-01-24 NOTE — Progress Notes (Signed)
Occupational Therapy Session Note  Patient Details  Name: Tammy Fernandez MRN: [id number] Date of Birth: [birth year]  Today's Date: 01/24/2014 Time: 1100-1155 Time Calculation (min): 55 min  Short Term Goals: Week 1:  OT Short Term Goal 1 (Week 1): Patient will complete toilet transfer with mod assist OT Short Term Goal 2 (Week 1): Patient will complete toileitng using BSC with max assist OT Short Term Goal 3 (Week 1): Patient will complete bathing, sitting and standing, using AE prn, with mod assist to maintain balance OT Short Term Goal 4 (Week 1): Patient will complete dressing sitting at edge of bed with min assist  Skilled Therapeutic Interventions/Progress Updates:    Engaged in ADL retraining with focus on increased participation in bathing and dressing tasks.  Pt internally distracted throughout session requiring re-direction to focus on current task.  Pt perseverating on getting her allergy medicine and the pain she experienced earlier when attempting to go to the bathroom. Performed bathing with mod instructional cueing and re-direction as pt gets internally distracted. Pt with decreased willingness to attempt any LB bathing or dressing, reports getting dizzy when bending forward and that her family will do those things for her and unwilling to attempt alternative method as this time.  Sit> stand for perineal hygiene with min assist for sit > stand and steady assist while standing and therapist washing buttocks and donning brief.  Pt requesting to return to bed at end of session to rest.  Mod instructional cues for bed mobility.  Pt with anxiety throughout session and continued to perseverate on her allergies and allergy medicine.  RN notified.  Therapy Documentation Precautions:  Precautions Precautions: Fall Precaution Comments: increased anxiety, decreased selective attention Restrictions Weight Bearing Restrictions: No Pain: Pain Assessment Pain Assessment: 0-10 Pain Score:  0-No pain Pain Type: Acute pain Pain Location: Back Pain Orientation: Mid Pain Descriptors / Indicators: Aching Pain Frequency: Intermittent Pain Onset: Gradual Patients Stated Pain Goal: 0 Pain Intervention(s): Medication (See eMAR)  See FIM for current functional status  Therapy/Group: Individual Therapy  Rosalio LoudHOXIE, Monet North 01/24/2014, 12:10 PM

## 2014-01-24 NOTE — Progress Notes (Addendum)
Physical Therapy Session Note  Patient Details  Name: Tammy Fernandez MRN: [id number] Date of Birth: 16-Oct-1924  Today's Date: 01/24/2014 Time: [id number] Time Calculation (min): 58 min  Short Term Goals: Week 1:  PT Short Term Goal 1 (Week 1): Pt will perform bed mobility to L and R on flat bed with mod A and 75% verbal cues for sequencing PT Short Term Goal 2 (Week 1): Pt will perform bed <> w/c transfers with consistent mod A and 75% cues for sequencing PT Short Term Goal 3 (Week 1): Pt will perform gait x 50' in controlled environment with mod A and 75% cues for safety/attention PT Short Term Goal 4 (Week 1): Pt will perform stair negotiation up/down 4 stairs with bilat rails and mod A and 75% cues for sequencing/safety  Skilled Therapeutic Interventions/Progress Updates:  Pt was seen bedside in the am. Pt transferred supine to edge of bed with side rail, head of bed elevated and mod A with verbal cues. Pt transferred edge of bed to w/c with min A and verbal cues. Pt transported to rehab gym. Pt ambulated x 3 with rolling walker and min A, 85 feet each time with verbal cues for technique.   Therapy Documentation Precautions:  Precautions Precautions: Fall Precaution Comments: increased anxiety, decreased selective attention Restrictions Weight Bearing Restrictions: No General: Amount of Missed PT Time (min): 15 Minutes Missed Time Reason: Nursing care Vital Signs:   Pain: No c/o pain.   Locomotion : Ambulation Ambulation/Gait Assistance: 4: Min assist    Therapy/Group: Individual Therapy  Rayford HalstedMitchell, Shiesha Jahn G  ADDENDUM: TOTAL TIME: [id number] 58 minutes MISSED TIME: 15 mins TREATMENT TIME: [id number] TREATMENT MINUTES: 43 MINUTES 01/24/2014, 1:53 PM

## 2014-01-24 NOTE — Progress Notes (Signed)
Physical Therapy Session Note  Patient Details  Name: Tammy Fernandez MRN: [id number] Date of Birth: 06-25-1924  Today's Date: 01/24/2014 Time: [id number] Time Calculation (min): 45 min  Short Term Goals: Week 1:  PT Short Term Goal 1 (Week 1): Pt will perform bed mobility to L and R on flat bed with mod A and 75% verbal cues for sequencing PT Short Term Goal 2 (Week 1): Pt will perform bed <> w/c transfers with consistent mod A and 75% cues for sequencing PT Short Term Goal 3 (Week 1): Pt will perform gait x 50' in controlled environment with mod A and 75% cues for safety/attention PT Short Term Goal 4 (Week 1): Pt will perform stair negotiation up/down 4 stairs with bilat rails and mod A and 75% cues for sequencing/safety  Skilled Therapeutic Interventions/Progress Updates:  Pt was seen bedside in the pm. Pt transferred supine to edge of bed with side rail, head of bed elevated and min A. Pt transferred edge of bed to w/c with min A. Pt transported to rehab gym. Performed standing LE exercises for strengthening and NMR. Pt ambulated with rolling walker and min A about 90 feet. Pt propelled w/c about 100 feet with B UEs and min A with verbal cues.   Therapy Documentation Precautions:  Precautions Precautions: Fall Precaution Comments: increased anxiety, decreased selective attention Restrictions Weight Bearing Restrictions: No General:   Pain: No c/o pain.    Locomotion : Ambulation Ambulation/Gait Assistance: 4: Min assist   See FIM for current functional status  Therapy/Group: Individual Therapy  Rayford HalstedMitchell, Joletta Manner G 01/24/2014, 3:52 PM

## 2014-01-24 NOTE — Progress Notes (Signed)
ANTIBIOTIC CONSULT NOTE - FOLLOW UP  Pharmacy Consult for vancomycin Indication: bacteremia  Labs: No results found for this basename: WBC, HGB, PLT, LABCREA, CREATININE,  in the last 72 hours No results found for this basename: VANCOTROUGH, VANCOPEAK, VANCORANDOM, GENTTROUGH, GENTPEAK, GENTRANDOM, TOBRATROUGH, TOBRAPEAK, TOBRARND, AMIKACINPEAK, AMIKACINTROU, AMIKACIN,  in the last 72 hours   Microbiology: Recent Results (from the past 720 hour(s))  URINE CULTURE     Status: None   Collection Time    01/11/14  7:51 PM      Result Value Ref Range Status   Specimen Description URINE, CATHETERIZED   Final   Special Requests NONE   Final   Culture  Setup Time     Final   Value: 01/11/2014 21:34     [facility]   Colony Count     Final   Value: >=100,000 COLONIES/ML     [facility]   Culture     Final   Value: ESCHERICHIA COLI     [facility]   Report Status 01/13/2014 FINAL   Final   Organism ID, Bacteria ESCHERICHIA COLI   Final  CULTURE, BLOOD (ROUTINE X 2)     Status: None   Collection Time    01/12/14  6:00 AM      Result Value Ref Range Status   Specimen Description BLOOD RIGHT HAND   Final   Special Requests BOTTLES DRAWN AEROBIC ONLY 5CC   Final   Culture  Setup Time     Final   Value: 01/12/2014 10:21     [facility]formed at [facility]   Culture     Final   Value: STAPHYLOCOCCUS SPECIES (COAGULASE NEGATIVE)     Note: SUSCEPTIBILITIES PERFORMED ON PREVIOUS CULTURE WITHIN THE LAST 5 DAYS.     Note: Gram Stain Report Called to,Read Back By and Verified With: Gray BernhardtENNE BROWN [id number] BRMEL     [facility]formed at [facility]   Report Status 01/15/2014 FINAL   Final  CULTURE, BLOOD (ROUTINE X 2)     Status: None   Collection Time    01/12/14  6:06 AM      Result Value Ref Range Status   Specimen Description BLOOD RIGHT ANTECUBITAL   Final   Special Requests BOTTLES DRAWN AEROBIC ONLY 3CC   Final   Culture  Setup Time     Final   Value: 01/12/2014 10:21     [facility]formed at [facility]   Culture     Final   Value: STAPHYLOCOCCUS SPECIES (COAGULASE NEGATIVE)     Note: RIFAMPIN AND GENTAMICIN SHOULD NOT BE USED AS SINGLE DRUGS FOR TREATMENT OF STAPH INFECTIONS.     Note: Gram Stain Report Called to,Read Back By and Verified With: Kerby MoorsENEE BROWN [id number] BRMEL     [facility]formed at [facility]   Report Status 01/15/2014 FINAL   Final   Organism ID, Bacteria STAPHYLOCOCCUS SPECIES (COAGULASE NEGATIVE)   Final  MRSA PCR SCREENING     Status: None   Collection Time    01/12/14  6:35 AM      Result Value Ref Range Status   MRSA by PCR NEGATIVE  NEGATIVE Final   Comment:            The GeneXpert MRSA Assay (FDA     approved for NASAL specimens     only), is one component of a     comprehensive MRSA colonization  surveillance program. It is not     intended to diagnose MRSA     infection nor to guide or     monitor treatment for     MRSA infections.  CULTURE, BLOOD (ROUTINE X 2)     Status: None   Collection Time    01/18/14 12:20 PM      Result Value Ref Range Status   Specimen Description BLOOD RIGHT HAND   Final   Special Requests BOTTLES DRAWN AEROBIC ONLY 5CC   Final   Culture  Setup Time     Final   Value: 01/18/2014 16:16     Performed at [facility]   Culture     Final   Value:        BLOOD CULTURE RECEIVED NO GROWTH TO DATE CULTURE WILL BE HELD FOR 5 DAYS BEFORE ISSUING A FINAL NEGATIVE REPORT     Performed at [facility]   Report Status PENDING   Incomplete     Assessment: 80yo female continues on vancomycin for bacteremia. Afeb  Goal of Therapy:  Vancomycin trough level 15-20 mcg/ml  Plan:  Cont vancomycin 1750mg  IV Q24H  Thanks for allowing pharmacy to be a part of this patient's care.  Talbert Cage, PharmD Clinical Pharmacist, [phone]

## 2014-01-24 NOTE — Progress Notes (Signed)
Subjective/Complaints: 80 y.o. female history hypertension. Patient independent and active prior to admission. Admitted 01/11/2014 with altered mental status staring to the right not moving her left side per family report. Noted sodium level 115 and potassium 5.7. MRI of the brain 01/11/2014 showed no convincing evidence to suggest acute ischemia, however there was mild bright signal within the right temporal cortex. Echocardiogram with ejection fraction 60% grade 1 diastolic dysfunction without embolus. EEG was negative for seizure. Carotid Dopplers with no ICA stenosis. Placed on aspirin for CVA prophylaxis. A repeat MRI of the brain 01/14/2014 showed small acute lacunar infarct in the right parietal lobe(posterior right MCA territory) advised to continue aspirin. Patient placed on intravenous fluids of normal saline for hyponatremia. Renal service followup for hyponatremia question element of SIADH and workup ongoing. Urine culture greater than 100,000 gram-negative rods placed on Rocephin. Blood cultures x2 on admission of methicillin resistant coagulase-negative staph bacteremia with infectious disease consult to Dr. Orvan Falconerampbell and Rocephin was changed to intravenous vancomycin x6 weeks. TEE completed 01/19/2014 showing ejection fraction 70% no evidence of endocarditis without thrombus . There was an atrial septal aneurysm with PFO. Venous Doppler studies lower extremities negative  Again very anxious. Concerned about nausea, constipation, sore throat, itching, bp, fatigue, etc. Concerned about participating in therapies today.    Objective: Vital Signs: Blood pressure 160/76, pulse 91, temperature 98.4 F (36.9 C), temperature source Oral, resp. rate 18, weight 85.8 kg (189 lb 2.5 oz), SpO2 98.00%. No results found. No results found for this or any previous visit (from the past 72 hour(s)).    General: No acute distress Mood and affect are inappropriate Heart: Regular rate and rhythm no rubs  murmurs or extra sounds Lungs: Clear to auscultation, breathing unlabored, no rales or wheezes Abdomen:sluggish bowel sounds, soft nontender to palpation, nondistended Extremities: No clubbing, cyanosis, or edema Skin: No evidence of breakdown, no evidence of rash Neurologic: Cranial nerves II through XII intact, motor strength is 4/5 in bilateral deltoid, bicep, tricep, grip, hip flexor, knee extensors, ankle dorsiflexor and plantar flexor  Musculoskeletal: Full range of motion in all 4 extremities. No joint swelling Oriented to person and place, not time unsure of day or night but inappropriate responses to sensory exam  Psych: anxious, keeps eyes closed a lot.   Assessment/Plan: 1. Functional deficits secondary to small parietal infarct with metabolic encephalopathy which require 3+ hours per day of interdisciplinary therapy in a comprehensive inpatient rehab setting. Physiatrist is providing close team supervision and 24 hour management of active medical problems listed below. Physiatrist and rehab team continue to assess barriers to discharge/monitor patient progress toward functional and medical goals.  FIM: FIM - Bathing Bathing Steps Patient Completed: Chest;Abdomen Bathing: 1: Total-Patient completes 0-2 of 10 parts or less than 25%  FIM - Upper Body Dressing/Undressing Upper body dressing/undressing steps patient completed: Thread/unthread right sleeve of pullover shirt/dresss;Thread/unthread left sleeve of pullover shirt/dress Upper body dressing/undressing: 0: Wears gown/pajamas-no public clothing FIM - Lower Body Dressing/Undressing Lower body dressing/undressing steps patient completed: Don/Doff left sock Lower body dressing/undressing: 3: Mod-Patient completed 50-74% of tasks  FIM - Toileting Toileting steps completed by patient: Performs perineal hygiene Toileting: 2: Max-Patient completed 1 of 3 steps  FIM - Diplomatic Services operational officerToilet Transfers Toilet Transfers Assistive Devices:  Elevated toilet seat;Grab bars;Walker Toilet Transfers: 3-To toilet/BSC: Mod A (lift or lower assist);3-From toilet/BSC: Mod A (lift or lower assist)  FIM - Bed/Chair Transfer Bed/Chair Transfer Assistive Devices: Bed rails;Arm rests Bed/Chair Transfer: 4: Chair or W/C > Bed:  Min A (steadying Pt. > 75%)  FIM - Locomotion: Wheelchair Distance: 150 total A Locomotion: Wheelchair: 1: Total Assistance/staff pushes wheelchair (Pt<25%) FIM - Locomotion: Ambulation Locomotion: Ambulation Assistive Devices: Designer, industrial/product Ambulation/Gait Assistance: 4: Min assist Locomotion: Ambulation: 2: Travels 50 - 149 ft with minimal assistance (Pt.>75%)  Comprehension Comprehension Mode: Auditory Comprehension: 2-Understands basic 25 - 49% of the time/requires cueing 51 - 75% of the time  Expression Expression Mode: Verbal Expression: 3-Expresses basic 50 - 74% of the time/requires cueing 25 - 50% of the time. Needs to repeat parts of sentences.  Social Interaction Social Interaction: 2-Interacts appropriately 25 - 49% of time - Needs frequent redirection.  Problem Solving Problem Solving: 2-Solves basic 25 - 49% of the time - needs direction more than half the time to initiate, plan or complete simple activities  Memory Memory: 2-Recognizes or recalls 25 - 49% of the time/requires cueing 51 - 75% of the time  Medical Problem List and Plan:  1. Small acute lacunar infarct/sepsis/metabolic encephalopathy with disrupted sleep /wake cycles ,reduce xanax and monitor2. DVT Prophylaxis/Anticoagulation: SCDs. Venous Dopplers negative  3. Pain Management: Tylenol as needed.  4. Mood/anxiety. Anxiety out of control. Increase xanax to 0.5mg  tid  -ask neuropsych to see pt tomorrow  -provided egosupport as well. Family needs to be positive as well.  - consider SSRI 5. Neuropsych: This patient is not capable of making decisions on her own behalf.  6. ID. Methicillin resistant coagulase-negative staph  bacteremia. Continue IV vancomycin x6 weeks  7. Dysphagia. Dysphagia 3 thin liquids. Followup speech therapy  8.Hyponatremia. Resolved with latest sodium 137/Follow up labs , hypercarbic will stop O2, sats 100% recheck BMET 9. Hypertension. Lopressor 25 mg twice a day. Increased to 37.5mg  bid yesterday. May need further titration  -anxiety not helping matters 10. Hypothyroidism. Synthroid. Latest TSH is 0.187   LOS (Days) 5 A FACE TO FACE EVALUATION WAS PERFORMED  SWARTZ,ZACHARY T 01/24/2014, 9:27 AM

## 2014-01-24 NOTE — Progress Notes (Signed)
Occupational Therapy Note  Patient Details  Name: Tammy Fernandez MRN: [id number] Date of Birth: [birth year] Today's Date: 01/24/2014  Time:1255-1335  (40 min) Pain:none Individual session  Focus of treatment was on self feeding; bed mobility, sit to stand , functional mobility.  Pt. Lying in bed and eating lunch with NT.   Needed increased time to chew food due to few teeth.  PT was min assist to get to EOB from  dupine to sit..  Amb. With RW with minimal assist to BR.  Pt got dress out of way before sitting on toilet.  .  Pt. Sat on Toilet and had BM/urine.  Left with NT to change linens.     Humberto Sealsdwards, Melvia Matousek J 01/24/2014, 1:48 PM

## 2014-01-25 ENCOUNTER — Inpatient Hospital Stay (HOSPITAL_COMMUNITY): Payer: PRIVATE HEALTH INSURANCE | Admitting: Speech Pathology

## 2014-01-25 ENCOUNTER — Encounter (HOSPITAL_COMMUNITY): Payer: Medicare Other | Admitting: Occupational Therapy

## 2014-01-25 ENCOUNTER — Inpatient Hospital Stay (HOSPITAL_COMMUNITY): Payer: Medicare Other | Admitting: Rehabilitation

## 2014-01-25 ENCOUNTER — Encounter (HOSPITAL_COMMUNITY): Payer: Medicare Other

## 2014-01-25 DIAGNOSIS — I69991 Dysphagia following unspecified cerebrovascular disease: Secondary | ICD-10-CM

## 2014-01-25 DIAGNOSIS — A4902 Methicillin resistant Staphylococcus aureus infection, unspecified site: Secondary | ICD-10-CM

## 2014-01-25 DIAGNOSIS — I6350 Cerebral infarction due to unspecified occlusion or stenosis of unspecified cerebral artery: Secondary | ICD-10-CM

## 2014-01-25 DIAGNOSIS — G9341 Metabolic encephalopathy: Secondary | ICD-10-CM

## 2014-01-25 DIAGNOSIS — R7881 Bacteremia: Secondary | ICD-10-CM

## 2014-01-25 DIAGNOSIS — R5381 Other malaise: Secondary | ICD-10-CM

## 2014-01-25 MED ORDER — SERTRALINE HCL 25 MG PO TABS
25.0000 mg | ORAL_TABLET | Freq: Every day | ORAL | Status: DC
  Administered 2014-01-25 – 2014-01-28 (×4): 25 mg via ORAL
  Filled 2014-01-25 (×6): qty 1

## 2014-01-25 MED ORDER — ALTEPLASE 100 MG IV SOLR
2.0000 mg | Freq: Once | INTRAVENOUS | Status: AC
  Administered 2014-01-25: 2 mg
  Filled 2014-01-25: qty 2

## 2014-01-25 MED ORDER — ALTEPLASE 2 MG IJ SOLR
2.0000 mg | Freq: Once | INTRAMUSCULAR | Status: AC
  Administered 2014-01-25: 2 mg
  Filled 2014-01-25: qty 2

## 2014-01-25 NOTE — Progress Notes (Signed)
Subjective/Complaints: 80 y.o. female history hypertension. Patient independe76nt and active prior to admission. Admitted 01/11/2014 with altered mental status staring to the right not moving her left side per family report. Noted sodium level 115 and potassium 5.7. MRI of the brain 01/11/2014 showed no convincing evidence to suggest acute ischemia, however there was mild bright signal within the right temporal cortex. Echocardiogram with ejection fraction 60% grade 1 diastolic dysfunction without embolus. EEG was negative for seizure. Carotid Dopplers with no ICA stenosis. Placed on aspirin for CVA prophylaxis. A repeat MRI of the brain 01/14/2014 showed small acute lacunar infarct in the right parietal lobe(posterior right MCA territory) advised to continue aspirin. Patient placed on intravenous fluids of normal saline for hyponatremia. Renal service followup for hyponatremia question element of SIADH and workup ongoing. Urine culture greater than 100,000 gram-negative rods placed on Rocephin. Blood cultures x2 on admission of methicillin resistant coagulase-negative staph bacteremia with infectious disease consult to Dr. Orvan Falconerampbell and Rocephin was changed to intravenous vancomycin x6 weeks. TEE completed 01/19/2014 showing ejection fraction 70% no evidence of endocarditis without thrombus . There was an atrial septal aneurysm with PFO. Venous Doppler studies lower extremities negative  Again very anxious. "when am I getting medications?"  Objective: Vital Signs: Blood pressure 165/74, pulse 86, temperature 98.2 F (36.8 C), temperature source Oral, resp. rate 18, weight 85.8 kg (189 lb 2.5 oz), SpO2 90.00%. No results found. No results found for this or any previous visit (from the past 72 hour(s)).    General: No acute distress Mood and affect are inappropriate Heart: Regular rate and rhythm no rubs murmurs or extra sounds Lungs: Clear to auscultation, breathing unlabored, no rales or  wheezes Abdomen:sluggish bowel sounds, soft nontender to palpation, nondistended Extremities: No clubbing, cyanosis, or edema Skin: No evidence of breakdown, no evidence of rash Neurologic: Cranial nerves II through XII intact, motor strength is 4/5 in bilateral deltoid, bicep, tricep, grip, hip flexor, knee extensors, ankle dorsiflexor and plantar flexor  Musculoskeletal: Full range of motion in all 4 extremities. No joint swelling Oriented to person and place, not time   Psych: anxious, repeats questions  Assessment/Plan: 1. Functional deficits secondary to small parietal infarct with metabolic encephalopathy which require 3+ hours per day of interdisciplinary therapy in a comprehensive inpatient rehab setting. Physiatrist is providing close team supervision and 24 hour management of active medical problems listed below. Physiatrist and rehab team continue to assess barriers to discharge/monitor patient progress toward functional and medical goals.  FIM: FIM - Bathing Bathing Steps Patient Completed: Chest;Abdomen;Right Arm;Left Arm;Front perineal area Bathing: 3: Mod-Patient completes 5-7 [id number] 10 parts or 50-74%  FIM - Upper Body Dressing/Undressing Upper body dressing/undressing steps patient completed: Put head through opening of pull over shirt/dress Upper body dressing/undressing: 2: Max-Patient completed 25-49% of tasks FIM - Lower Body Dressing/Undressing Lower body dressing/undressing steps patient completed: Don/Doff left sock Lower body dressing/undressing: 1: Total-Patient completed less than 25% of tasks  FIM - Toileting Toileting steps completed by patient: Adjust clothing prior to toileting Toileting: 2: Max-Patient completed 1 of 3 steps  FIM - Diplomatic Services operational officerToilet Transfers Toilet Transfers Assistive Devices: Elevated toilet seat;Grab bars;Walker Toilet Transfers: 3-To toilet/BSC: Mod A (lift or lower assist);3-From toilet/BSC: Mod A (lift or lower assist)  FIM - Bed/Chair  Transfer Bed/Chair Transfer Assistive Devices: Bed rails Bed/Chair Transfer: 3: Supine > Sit: Mod A (lifting assist/Pt. 50-74%/lift 2 legs;4: Bed > Chair or W/C: Min A (steadying Pt. > 75%)  FIM - Locomotion: Wheelchair Distance: 150 total  A Locomotion: Wheelchair: 2: Travels 50 - 149 ft with minimal assistance (Pt.>75%) FIM - Locomotion: Ambulation Locomotion: Ambulation Assistive Devices: Designer, industrial/product Ambulation/Gait Assistance: 4: Min assist Locomotion: Ambulation: 2: Travels 50 - 149 ft with minimal assistance (Pt.>75%)  Comprehension Comprehension Mode: Auditory Comprehension: 2-Understands basic 25 - 49% of the time/requires cueing 51 - 75% of the time  Expression Expression Mode: Verbal Expression: 3-Expresses basic 50 - 74% of the time/requires cueing 25 - 50% of the time. Needs to repeat parts of sentences.  Social Interaction Social Interaction: 2-Interacts appropriately 25 - 49% of time - Needs frequent redirection.  Problem Solving Problem Solving: 2-Solves basic 25 - 49% of the time - needs direction more than half the time to initiate, plan or complete simple activities  Memory Memory: 2-Recognizes or recalls 25 - 49% of the time/requires cueing 51 - 75% of the time  Medical Problem List and Plan:  1. Small acute lacunar infarct/sepsis/metabolic encephalopathy with disrupted sleep /wake cycles ,reduce xanax and monitor2. DVT Prophylaxis/Anticoagulation: SCDs. Venous Dopplers negative  3. Pain Management: Tylenol as needed.  4. Mood/anxiety. Anxiety out of control. Increase xanax to 0.5mg  tid  -ask neuropsych to see pt tomorrow  -provided egosupport as well. Family needs to be positive as well.  - start low dose SSRI 5. Neuropsych: This patient is not capable of making decisions on her own behalf.  6. ID. Methicillin resistant coagulase-negative staph bacteremia. Continue IV vancomycin x6 weeks  7. Dysphagia. Dysphagia 3 thin liquids. Followup speech therapy   8.Hyponatremia. Resolved with latest sodium 137/Follow up labs , hypercarbic will stop O2, sats 100% recheck BMET 9. Hypertension. Lopressor 25 mg twice a day. Increased to 37.5mg  bid yesterday. May need further titration  -anxiety not helping matters 10. Hypothyroidism. Synthroid. Latest TSH is 0.187   LOS (Days) 6 A FACE TO FACE EVALUATION WAS PERFORMED  KIRSTEINS,ANDREW E 01/25/2014, 7:06 AM

## 2014-01-25 NOTE — Progress Notes (Signed)
Speech Language Pathology Daily Session Note  Patient Details  Name: Tammy Fernandez MRN: [id number] Date of Birth: Jul 20, 1924  Today's Date: 01/25/2014 Time: [id number] Time Calculation (min): 30 min  Short Term Goals: Week 1: SLP Short Term Goal 1 (Week 1): Patient will consume Dys.2 textures and thin liquids with Max verbal and tactile cues to utilize safe swallow strategies. SLP Short Term Goal 1 - Progress (Week 1): Progressing toward goal SLP Short Term Goal 2 (Week 1): Patient will maintain topic of conversation for 3 turns with Mod clinician cues. SLP Short Term Goal 2 - Progress (Week 1): Progressing toward goal SLP Short Term Goal 3 (Week 1): Patient will sustain attention to basic self-care task for 30 seconds with Mod verbal and visual cues. SLP Short Term Goal 3 - Progress (Week 1): Progressing toward goal SLP Short Term Goal 4 (Week 1): Patient will utilize call bell to request help with Max verbal and visual cues. SLP Short Term Goal 4 - Progress (Week 1): Progressing toward goal SLP Short Term Goal 5 (Week 1): Patient will utilize external aids for orientation with Max clinician cues. SLP Short Term Goal 5 - Progress (Week 1): Progressing toward goal  Skilled Therapeutic Interventions: Skilled ST intervention provided with focus on cognitive goals. Pt oriented x4 and recalled location of external aides, independently. Pt required max encouragement to participate in tx activities but responded to cues well. Pt's son present for therapy session and states that patient seems less confused. Pt noted with increased attention. Pt attended to simple problem solving tasks for 3 minutes, min verbal cues for redirection. Pt maintained topic of conversation for 10+ conversational turns. Pt located call bell and demonstrated correct use, independently.    FIM:  Comprehension Comprehension Mode: Auditory Comprehension: 3-Understands basic 50 - 74% of the time/requires cueing 25 - 50%  of  the time Expression Expression Mode: Verbal Expression: 3-Expresses basic 50 - 74% of the time/requires cueing 25 - 50% of the time. Needs to repeat parts of sentences. Social Interaction Social Interaction: 2-Interacts appropriately 25 - 49% of time - Needs frequent redirection. Problem Solving Problem Solving: 2-Solves basic 25 - 49% of the time - needs direction more than half the time to initiate, plan or complete simple activities Memory Memory: 3-Recognizes or recalls 50 - 74% of the time/requires cueing 25 - 49% of the time  Pain Pain Assessment Pain Assessment: No/denies pain  Therapy/Group: Individual Therapy  Lankford Gutzmer, Kara PacerLeah N 01/25/2014, 5:48 PM

## 2014-01-25 NOTE — Progress Notes (Signed)
Physical Therapy Session Note  Patient Details  Name: Tammy Fernandez MRN: [id number] Date of Birth: 1924-09-13  Today's Date: 01/25/2014 Time: 1400-1430 Time Calculation (min): 30 min  Short Term Goals: Week 1:  PT Short Term Goal 1 (Week 1): Pt will perform bed mobility to L and R on flat bed with mod A and 75% verbal cues for sequencing PT Short Term Goal 2 (Week 1): Pt will perform bed <> w/c transfers with consistent mod A and 75% cues for sequencing PT Short Term Goal 3 (Week 1): Pt will perform gait x 50' in controlled environment with mod A and 75% cues for safety/attention PT Short Term Goal 4 (Week 1): Pt will perform stair negotiation up/down 4 stairs with bilat rails and mod A and 75% cues for sequencing/safety  Skilled Therapeutic Interventions/Progress Updates:   Pt received lying in bed this afternoon.  Per son, she has soiled her brief and needs assist to be changed in restroom.  Also son states that they met with neuropsychologist and he recommends that she stay in room to complete her therapies as she did not do this much physical activity at home and that it makes her very anxious and increases her blood pressure.  I acknowledge these statements, however discussed that if plan is to return home this week, we will need to continue to practice stairs, car transfer and bed mobility to ensure safe D/C home.  Son verbalizes understanding.  Assisted pt into sitting with min assist and mod to max verbal cues for safe technique and hand placement.  Performed approx 18' gait x 2 reps with RW at min/guard to/from restroom with continued cues for upright posture and maintaining position inside of RW.  Pt able to adjust brief, perform approx 60% of peri care (note alyvan bandage soiled as well, so removed), and adjusted brief following toileting.  Note she was incontinent of some bowel but did void on toilet.  Pt ambulated to sink and was able to wash hands at sink at supervision level with mod  verbal cues for step by step instructions as she was unable to process steps to wash hands.  Once back at EOB, assisted with changing gown, as it was also soiled.  Pt then refused to get OOB, despite max verbal encouragement.  Pt did agree to stand to get in better position before lying back down.  Provided assist for LLE into bed and pt left on L side with pillow in between legs, bed alarm set, and all needs in reach.   Therapy Documentation Precautions:  Precautions Precautions: Fall Precaution Comments: increased anxiety, decreased selective attention Restrictions Weight Bearing Restrictions: No General: Amount of Missed PT Time (min): 30 Minutes Missed Time Reason: Patient fatigue;Patient unwilling/refused to participate without medical reason Vital Signs: Therapy Vitals Temp: 97.5 F (36.4 C) Temp src: Oral Pulse Rate: 62 Resp: 18 BP: 168/83 mmHg Patient Position, if appropriate: Lying Oxygen Therapy SpO2: 91 % Pain: Pain Assessment Pain Assessment: 0-10 Pain Score: 0-No pain   Locomotion : Ambulation Ambulation/Gait Assistance: 4: Min assist   See FIM for current functional status  Therapy/Group: Individual Therapy  Denice Bors 01/25/2014, 2:44 PM

## 2014-01-25 NOTE — Progress Notes (Signed)
Occupational Therapy Session Note  Patient Details  Name: Tammy BeachRosa L Steadman MRN: [id number] Date of Birth: [birth year]  Today's Date: 01/25/2014 Time: 0930-1030 Time Calculation (min): 60 min  Short Term Goals: Week 1:  OT Short Term Goal 1 (Week 1): Patient will complete toilet transfer with mod assist OT Short Term Goal 2 (Week 1): Patient will complete toileitng using BSC with max assist OT Short Term Goal 3 (Week 1): Patient will complete bathing, sitting and standing, using AE prn, with mod assist to maintain balance OT Short Term Goal 4 (Week 1): Patient will complete dressing sitting at edge of bed with min assist  Skilled Therapeutic Interventions/Progress Updates:    Pt in bed eating to start session.  Severely distracted internally by her needing her allergy pill and her BP being high even though it was not.  Pt requiring max instructional cueing to get up to the EOB.  Pt ambulated to the toilet with min assist using the RW.  She needs mod instructional cueing to stay close to the walker as she tends to push it too far forward.  Pt unable to perform BM but did urinate.  Noted brief that was removed was soiled with urine.  She performed transfer out to the wheelchair and worked on bathing in sitting.  O2 sats 89-93% on room air while bathing with HR remaining in the 70s.  Pt perseverating on needing O2 at times but explained to her that her oxygen was fine.  No noted dyspnea either.  Pt performed all bathing except for her feet which therapist assisted her with.  She needed total assist to donn gripper socks as well.  Pt's daughter present for session and provided encouragement to participate.  Pt is more self limiting secondary to anxiety than anything, constantly worrying about her allergy pills, neck pain, her BP, etc.    Therapy Documentation Precautions:  Precautions Precautions: Fall Precaution Comments: increased anxiety, decreased selective attention Restrictions Weight Bearing  Restrictions: No  Pain: Pain Assessment Pain Assessment: 0-10 Pain Score: 0-No pain Faces Pain Scale: Hurts a little bit Pain Type: Acute pain Pain Location: Neck Pain Intervention(s): Repositioned;Emotional support Multiple Pain Sites: No ADL: See FIM for current functional status  Therapy/Group: Individual Therapy  Alyx Mcguirk OTR/L 01/25/2014, 10:39 AM

## 2014-01-25 NOTE — Progress Notes (Signed)
Physical Therapy Session Note  Patient Details  Name: Tammy BeachRosa L Frontera MRN: [id number] Date of Birth: July 26, 1924  Today's Date: 01/25/2014 Time: [id number] Time Calculation (min): 44 min  Short Term Goals: Week 1:  PT Short Term Goal 1 (Week 1): Pt will perform bed mobility to L and R on flat bed with mod A and 75% verbal cues for sequencing PT Short Term Goal 2 (Week 1): Pt will perform bed <> w/c transfers with consistent mod A and 75% cues for sequencing PT Short Term Goal 3 (Week 1): Pt will perform gait x 50' in controlled environment with mod A and 75% cues for safety/attention PT Short Term Goal 4 (Week 1): Pt will perform stair negotiation up/down 4 stairs with bilat rails and mod A and 75% cues for sequencing/safety  Skilled Therapeutic Interventions/Progress Updates:   Pt received sitting in w/c in room, having just finished with OT session.  Family present during session, therefore focused on functional transfers, gait, bed mobility in ADL apt, car transfer and gait to prepare for D/C.  Assisted pt to gym via w/c at total assist.  Performed gait x 65' x 2 and 50' x 2 reps with RW at min/guard to close supervision level with cues for maintaining position inside of RW and for maintaining upright posture.  Performed sit <> stand in ADL apt from couch to simulate home environment.  Performed at supervision level with mod cues for hand placement.  Performed supine <> sit in ADL apt bed at supervision for sit >supine and min assist for supine to sit (she was able to roll at supervision level).  Pt unable to problem solve how to perform task without bedrails, therefore required max to total assist cues for using hands on bed to elevate trunk.  Ambulated to ortho gym in order to perform car transfer at height of home vehicle.  Performed at min/guard level with mod verbal cues for backing up to seat and reaching back with hands.  When getting out of car, she had increased difficulty initiating  movement, wanting somewhat to assist her, however this therapist provided cues for using hands and scooting hips, and she was then able to perform.  Ended session with stair training x 2, 5" steps with handrail on the L in step to pattern.  Provided HHA for RUE support with total assist cues for correct sequencing/technique.  Ambulated back to ADL apt to rest then back to main gym to w/c.  Assisted pt back to room and left in w/c with quick release belt donned and family in place.  Provided education that family will begin to perform these activities with her towards end of week.    Note:  SaO2 remained in 90's throughout session on RA.    Therapy Documentation Precautions:  Precautions Precautions: Fall Precaution Comments: increased anxiety, decreased selective attention Restrictions Weight Bearing Restrictions: No   Vital Signs: Therapy Vitals Pulse Rate: 80 BP: 159/72 mmHg Pain: Pain Assessment Pain Assessment: 0-10 Pain Score: 0-No pain Faces Pain Scale: Hurts a little bit Pain Type: Acute pain Pain Location: Neck Pain Intervention(s): Repositioned;Emotional support Multiple Pain Sites: No   Locomotion : Ambulation Ambulation/Gait Assistance: 4: Min assist   See FIM for current functional status  Therapy/Group: Individual Therapy  Vista Deckarcell, Enedelia Martorelli Ann 01/25/2014, 12:03 PM

## 2014-01-26 ENCOUNTER — Inpatient Hospital Stay (HOSPITAL_COMMUNITY): Payer: PRIVATE HEALTH INSURANCE | Admitting: Speech Pathology

## 2014-01-26 ENCOUNTER — Inpatient Hospital Stay (HOSPITAL_COMMUNITY): Payer: Medicare Other | Admitting: Rehabilitation

## 2014-01-26 ENCOUNTER — Encounter (HOSPITAL_COMMUNITY): Payer: Medicare Other | Admitting: Occupational Therapy

## 2014-01-26 LAB — VANCOMYCIN, TROUGH: Vancomycin Tr: 31.8 ug/mL (ref 10.0–20.0)

## 2014-01-26 MED ORDER — PROPRANOLOL HCL 10 MG PO TABS
10.0000 mg | ORAL_TABLET | Freq: Three times a day (TID) | ORAL | Status: DC
  Administered 2014-01-26 – 2014-01-28 (×7): 10 mg via ORAL
  Filled 2014-01-26 (×10): qty 1

## 2014-01-26 NOTE — Progress Notes (Signed)
Physical Therapy Session Note  Patient Details  Name: Tammy Fernandez MRN: [id number] Date of Birth: [birth year]  Today's Date: 01/26/2014 Time: 1000-1010 Time Calculation (min): 10 min  Short Term Goals: Week 1:  PT Short Term Goal 1 (Week 1): Pt will perform bed mobility to L and R on flat bed with mod A and 75% verbal cues for sequencing PT Short Term Goal 2 (Week 1): Pt will perform bed <> w/c transfers with consistent mod A and 75% cues for sequencing PT Short Term Goal 3 (Week 1): Pt will perform gait x 50' in controlled environment with mod A and 75% cues for safety/attention PT Short Term Goal 4 (Week 1): Pt will perform stair negotiation up/down 4 stairs with bilat rails and mod A and 75% cues for sequencing/safety  Skilled Therapeutic Interventions/Progress Updates:   Pt received lying in bed this morning, refusing to get OOB, stating that "God will take care of it."  Discussed at length with two sons in room that family would need to perform stairs, gait, bed mobility, and car transfers with pt in order to ensure safe D/C home.  Family in agreement, however not willing to "push" pt into getting OOB this morning.  Pt continues to be very confused and disoriented to time.  She continued to state "I'm sick so I need to stay in the bed."  Left pt in room in bed with bed alarm set and all needs in reach.  Sons in room with pt.   Therapy Documentation Precautions:  Precautions Precautions: Fall Precaution Comments: increased anxiety, decreased selective attention Restrictions Weight Bearing Restrictions: No General: Amount of Missed PT Time (min): 50 Minutes Missed Time Reason: Patient unwilling/refused to participate without medical reason   Pain: Pt with pain in abdomen and head "a little bit."  RN aware.   See FIM for current functional status  Therapy/Group: Individual Therapy  Vista Deckarcell, Lajarvis Italiano Ann 01/26/2014, 10:30 AM

## 2014-01-26 NOTE — Progress Notes (Signed)
Physical Therapy Session Note  Patient Details  Name: Tammy BeachRosa L Marro MRN: [id number] Date of Birth: 1923/12/29  Today's Date: 01/26/2014 Time: 1131-1203 Time Calculation (min): 32 min  Short Term Goals: Week 1:  PT Short Term Goal 1 (Week 1): Pt will perform bed mobility to L and R on flat bed with mod A and 75% verbal cues for sequencing PT Short Term Goal 2 (Week 1): Pt will perform bed <> w/c transfers with consistent mod A and 75% cues for sequencing PT Short Term Goal 3 (Week 1): Pt will perform gait x 50' in controlled environment with mod A and 75% cues for safety/attention PT Short Term Goal 4 (Week 1): Pt will perform stair negotiation up/down 4 stairs with bilat rails and mod A and 75% cues for sequencing/safety  Skilled Therapeutic Interventions/Progress Updates:   Pt received lying in bed, having just had dressing change by RN.  Note that pt is saturated in urine, therefore focus of session was assisting to restroom, donning new brief and returning to bed.  Pt initially refusing to get OOB and wanted son to assist her, however son was not in room and this therapist provided max education on why she is unable to stay in bed wet.  Pt finally agreed to get up, however when getting into sitting, she kept stating, "get my son" "you can't help me."  Pt was then unwilling to try, and again following max encouragement, she then was able to sit up with mod assist.  Once at EOB, continue to provide max encouragement in order to coax pt into going to restroom. She was able to ambulate to/from restroom with RW at min/guard to close supervision.  Provided assist for adjusting brief prior to and following toileting and also assisted with peri care.  She was able to void some urine and also bowel in commode.  Pt ambulated to sink and required max verbal cues to initiate getting soap and turning on water.  Pt returned to bed in supine position with supervision and max cues to correct position in bed.  Pt  left in bed with all needs in reach, bed alarm set and 3 bed rails up.  Son then back in room.   Therapy Documentation Precautions:  Precautions Precautions: Fall Precaution Comments: increased anxiety, decreased selective attention Restrictions Weight Bearing Restrictions: No General: Amount of Missed PT Time (min): 50 Minutes Missed Time Reason: Patient unwilling/refused to participate without medical reason   Pain: Pt with abdominal pain, RN aware.    Locomotion : Ambulation Ambulation/Gait Assistance: 5: Supervision;4: Min guard   See FIM for current functional status  Therapy/Group: Individual Therapy  Vista Deckarcell, Rosita Guzzetta Ann 01/26/2014, 12:17 PM

## 2014-01-26 NOTE — Progress Notes (Signed)
Subjective/Complaints: 80 y.o. female history hypertension. Patient independent and active prior to admission. Admitted 01/11/2014 with altered mental status staring to the right not moving her left side per family report. Noted sodium level 115 and potassium 5.7. MRI of the brain 01/11/2014 showed no convincing evidence to suggest acute ischemia, however there was mild bright signal within the right temporal cortex. Echocardiogram with ejection fraction 60% grade 1 diastolic dysfunction without embolus. EEG was negative for seizure. Carotid Dopplers with no ICA stenosis. Placed on aspirin for CVA prophylaxis. A repeat MRI of the brain 01/14/2014 showed small acute lacunar infarct in the right parietal lobe(posterior right MCA territory) advised to continue aspirin. Patient placed on intravenous fluids of normal saline for hyponatremia. Renal service followup for hyponatremia question element of SIADH and workup ongoing. Urine culture greater than 100,000 gram-negative rods placed on Rocephin. Blood cultures x2 on admission of methicillin resistant coagulase-negative staph bacteremia with infectious disease consult to Dr. Orvan Falconer and Rocephin was changed to intravenous vancomycin x6 weeks. TEE completed 01/19/2014 showing ejection fraction 70% no evidence of endocarditis without thrombus . There was an atrial septal aneurysm with PFO. Venous Doppler studies lower extremities negative  Pt would like to go on "home med" inderal, currently on metoprolol Pt also refusing PT today but was ok with SLP and OT Objective: Vital Signs: Blood pressure 187/75, pulse 67, temperature 98.7 F (37.1 C), temperature source Oral, resp. rate 19, weight 85.8 kg (189 lb 2.5 oz), SpO2 91.00%. No results found. Results for orders placed during the hospital encounter of 01/19/14 (from the past 72 hour(s))  VANCOMYCIN, TROUGH     Status: Abnormal   Collection Time    01/26/14  5:05 AM      Result Value Ref Range   Vancomycin  Tr 31.8 (*) 10.0 - 20.0 ug/mL   Comment: CRITICAL RESULT CALLED TO, READ BACK BY AND VERIFIED WITH:     CAMPIONE D,RN 01/26/14 0550 WAYK      General: No acute distress Mood and affect are inappropriate Heart: Regular rate and rhythm no rubs murmurs or extra sounds Lungs: Clear to auscultation, breathing unlabored, no rales or wheezes Abdomen:sluggish bowel sounds, soft nontender to palpation, nondistended Extremities: No clubbing, cyanosis, or edema Skin: No evidence of breakdown, no evidence of rash Neurologic: Cranial nerves II through XII intact, motor strength is 4/5 in bilateral deltoid, bicep, tricep, grip, hip flexor, knee extensors, ankle dorsiflexor and plantar flexor  Musculoskeletal: Full range of motion in all 4 extremities. No joint swelling Oriented to person and place, not time   Psych: anxious, repeats questions  Assessment/Plan: 1. Functional deficits secondary to small parietal infarct with metabolic encephalopathy which require 3+ hours per day of interdisciplinary therapy in a comprehensive inpatient rehab setting. Physiatrist is providing close team supervision and 24 hour management of active medical problems listed below. Physiatrist and rehab team continue to assess barriers to discharge/monitor patient progress toward functional and medical goals.  FIM: FIM - Bathing Bathing Steps Patient Completed: Chest;Abdomen;Right Arm;Left Arm;Front perineal area;Buttocks;Right upper leg;Left upper leg Bathing: 4: Min-Patient completes 8-9 70f 10 parts or 75+ percent  FIM - Upper Body Dressing/Undressing Upper body dressing/undressing steps patient completed: Put head through opening of pull over shirt/dress Upper body dressing/undressing: 0: Wears gown/pajamas-no public clothing FIM - Lower Body Dressing/Undressing Lower body dressing/undressing steps patient completed: Don/Doff left sock Lower body dressing/undressing: 1: Total-Patient completed less than 25% of  tasks  FIM - Toileting Toileting steps completed by patient: Adjust clothing prior  to toileting Toileting: 1: Total-Patient completed zero steps, helper did all 3  FIM - Diplomatic Services operational officerToilet Transfers Toilet Transfers Assistive Devices: Elevated toilet seat;Grab bars Toilet Transfers: 4-From toilet/BSC: Min A (steadying Pt. > 75%);4-To toilet/BSC: Min A (steadying Pt. > 75%)  FIM - Bed/Chair Transfer Bed/Chair Transfer Assistive Devices: Bed rails Bed/Chair Transfer: 4: Supine > Sit: Min A (steadying Pt. > 75%/lift 1 leg);5: Sit > Supine: Supervision (verbal cues/safety issues)  FIM - Locomotion: Wheelchair Distance: 150 total A Locomotion: Wheelchair: 0: Activity did not occur FIM - Locomotion: Ambulation Locomotion: Ambulation Assistive Devices: Designer, industrial/productWalker - Rolling Ambulation/Gait Assistance: 4: Min assist Locomotion: Ambulation: 1: Travels less than 50 ft with minimal assistance (Pt.>75%)  Comprehension Comprehension Mode: Auditory Comprehension: 3-Understands basic 50 - 74% of the time/requires cueing 25 - 50%  of the time  Expression Expression Mode: Verbal Expression: 3-Expresses basic 50 - 74% of the time/requires cueing 25 - 50% of the time. Needs to repeat parts of sentences.  Social Interaction Social Interaction: 2-Interacts appropriately 25 - 49% of time - Needs frequent redirection.  Problem Solving Problem Solving: 2-Solves basic 25 - 49% of the time - needs direction more than half the time to initiate, plan or complete simple activities  Memory Memory: 3-Recognizes or recalls 50 - 74% of the time/requires cueing 25 - 49% of the time  Medical Problem List and Plan:  1. Small acute lacunar infarct/sepsis/metabolic encephalopathy with disrupted sleep /wake cycles ,reduce xanax and monitor2. DVT Prophylaxis/Anticoagulation: SCDs. Venous Dopplers negative  3. Pain Management: Tylenol as needed.  4. Mood/anxiety. Anxiety out of control. Increase xanax to 0.5mg  tid  -Neuropsych  documented severe cog def, discussed with pt and son who are minimizing defs  -provided egosupport as well. Family needs to be positive as well.  - start low dose SSRI 5. Neuropsych: This patient is not capable of making decisions on her own behalf.  6. ID. Methicillin resistant coagulase-negative staph bacteremia. Continue IV vancomycin x6 weeks  7. Dysphagia. Dysphagia 3 thin liquids. Followup speech therapy  8.Hyponatremia. Resolved with latest sodium 137/Follow up labs , hypercarbic will stop O2, sats 100% recheck BMET 9. Hypertension. Lopressor 25 mg twice a day. Increased to 37.5mg  bid yesterday.was on inderal at home which has some anti anxiety effects, will try changing back  -anxiety not helping matters 10. Hypothyroidism. Synthroid. Latest TSH is 0.187   LOS (Days) 7 A FACE TO FACE EVALUATION WAS PERFORMED  Tammy Fernandez E 01/26/2014, 8:16 AM

## 2014-01-26 NOTE — Progress Notes (Signed)
ANTIBIOTIC CONSULT NOTE - FOLLOW UP  Pharmacy Consult for Vancomycin  Indication: Bacteremia, CoNS  Allergies  Allergen Reactions  . Apple Flavor Hives  . Peanut Butter Flavor Hives  . Crestor [Rosuvastatin Calcium]   . Sulfa Drugs Cross Reactors Rash     Recent Labs  01/26/14 0505  VANCOTROUGH 31.8*    Assessment: 80 y/o on vancomycin for CoNS bacteremia, was previously adjusted for low VT, now with VT of 31.8 (confirmed given before vancomycin was hung---vancomycin was charted as given this AM but nurse had just started it and I instructed her to turn it off. Labs as above, no jump in Scr (UOP doesn't appear documented well)  Goal of Therapy:  Vancomycin trough level 15-20 mcg/ml  Plan:  -Re-check VT in the AM to assess current vancomycin clearance   Abran DukeLedford, Ryson Bacha 01/26/2014,7:15 AM

## 2014-01-26 NOTE — Progress Notes (Signed)
CRITICAL VALUE ALERT  Critical value received: vancomycin trough 31.8  Date of notification:  01/26/14   Time of notification:  0550  Critical value read back:yes  Nurse who received alert:  Margarine Grosshans  MD notified (1st page):  pharmacist  Time of first page:  [phone]  MD notified (2nd page):  Time of second page:  Responding MD:  Per pharmacy  Time MD responded:  [phone]

## 2014-01-26 NOTE — Progress Notes (Signed)
Occupational Therapy Session Note  Patient Details  Name: Tammy Fernandez MRN: [id number] Date of Birth: 1924-11-13  Today's Date: 01/26/2014 Time: 0903-1002 Time Calculation (min): 59 min  Short Term Goals: Week 1:  OT Short Term Goal 1 (Week 1): Patient will complete toilet transfer with mod assist OT Short Term Goal 2 (Week 1): Patient will complete toileitng using BSC with max assist OT Short Term Goal 3 (Week 1): Patient will complete bathing, sitting and standing, using AE prn, with mod assist to maintain balance OT Short Term Goal 4 (Week 1): Patient will complete dressing sitting at edge of bed with min assist  Skilled Therapeutic Interventions/Progress Updates:    Pt worked on bathing and dressing sit to stand at the EOB.  Pt needing mod instructional cueing to coax her to sitting on the edge of the bed.  Needed min assist and min instructional cueing for technique during this task.  Pt internally distracted by abdominal pain, her BP, ect.  Needed multiple attempts to re-direct each time.  BP taken in supine at 139/74 as pt kept insisting that her BP was elevated.  Pt performed all UB bathing with supervision, and LB bathing with min assist as she needed assistance to wash the left foot and peri area.  She was able to wash the right foot by crossing it over the left knee as well as donning her right sock.  With all sit to stand and standing she was able to perform with close supervision.  Pt returned to supine once finished with bathing and dressing and would not transfer to wheelchair for next session.    Therapy Documentation Precautions:  Precautions Precautions: Fall Precaution Comments: increased anxiety, decreased selective attention Restrictions Weight Bearing Restrictions: No  Pain: Pain Assessment Pain Assessment: Faces Faces Pain Scale: Hurts little more Pain Type: Acute pain Pain Location: Abdomen Pain Orientation: Right Pain Intervention(s): Repositioned;Emotional  support ADL: See FIM for current functional status  Therapy/Group: Individual Therapy  Timithy Arons OTR/L 01/26/2014, 12:24 PM

## 2014-01-26 NOTE — Progress Notes (Signed)
Speech Language Pathology Daily Session Note  Patient Details  Name: Tammy BeachRosa L Demartin MRN: [id number] Date of Birth: 1924/04/14  Today's Date: 01/26/2014 Time: 1330-1420 Time Calculation (min): 50 min  Short Term Goals: Week 1: SLP Short Term Goal 1 (Week 1): Patient will consume Dys.2 textures and thin liquids with Max verbal and tactile cues to utilize safe swallow strategies. SLP Short Term Goal 1 - Progress (Week 1): Progressing toward goal SLP Short Term Goal 2 (Week 1): Patient will maintain topic of conversation for 3 turns with Mod clinician cues. SLP Short Term Goal 2 - Progress (Week 1): Progressing toward goal SLP Short Term Goal 3 (Week 1): Patient will sustain attention to basic self-care task for 30 seconds with Mod verbal and visual cues. SLP Short Term Goal 3 - Progress (Week 1): Progressing toward goal SLP Short Term Goal 4 (Week 1): Patient will utilize call bell to request help with Max verbal and visual cues. SLP Short Term Goal 4 - Progress (Week 1): Progressing toward goal SLP Short Term Goal 5 (Week 1): Patient will utilize external aids for orientation with Max clinician cues. SLP Short Term Goal 5 - Progress (Week 1): Progressing toward goal  Skilled Therapeutic Interventions: Of note, upon SLP entering room patient taking small round yellow pill.  RN not present and when questioned by SLP family reported that MD informed then this morning that she could take her allergy pill from home as needed.  SLP notified RN of event.      Skilled intervention provided with focus on cognitive goals. Patient required Max encouragement to participate in therapy session reporting some side pain, RN aware.  Following requests for family to leave room patient responded to cues well and agreeable to participate.  Pain no worse in wheelchair.   Patient oriented x3 and required Mod cues to utilize external aids to assist with recall of orientation information. Patient attended to basic  money sorting and counting task for 3 minutes with Min verbal cues for redirection and Max verbal cues to problem solve completion of task.  Continue with current plan of care.   FIM:  Comprehension Comprehension Mode: Auditory Comprehension: 3-Understands basic 50 - 74% of the time/requires cueing 25 - 50%  of the time Expression Expression Mode: Verbal Expression: 3-Expresses basic 50 - 74% of the time/requires cueing 25 - 50% of the time. Needs to repeat parts of sentences.  Pain Some right side pain, patient unable to rate, movement does not change intensity, RN aware  Therapy/Group: Individual Therapy  Charlane FerrettiMelissa Enola Siebers, M.A., CCC-SLP [phone]  Kirstie Larsen 01/26/2014, 5:11 PM

## 2014-01-26 NOTE — Consult Note (Signed)
NEUROBEHAVIORAL STATUS EXAM - [facility]   MEDICAL NECESSITY:  Tammy Fernandez was seen on the [facility] for a neurobehavioral status exam owing to the patient's diagnosis of cerebral infarction, and to assist in treatment planning during admission.   According to medical records, Tammy Fernandez was admitted to the rehab unit owing to "Functional deficits secondary to small right parietal infarct/metabolic encephalopathy." She has a history of HTN. She was admitted on 01/11/2014 with altered mental status. MRI of the brain 01/11/2014 showed no convincing evidence to suggest acute ischemia, however there was mild bright signal within the right temporal cortex. EEG was negative for seizure. A repeat MRI of the brain 01/14/2014 reportedly revealed a small acute lacunar infarct in the right parietal lobe (posterior right MCA territory).   During today's visit, Tammy Fernandez was accompanied by her son who provided a lot of the information requested. The patient was not insightful about her level of cognitive deficit, though she did acknowledge experiencing sudden onset memory loss. Her son has noticed significant attention/concentration problems. No symptoms reportedly existed prior to the stroke. She admits to difficulty with speech.   In regard to daily life, Tammy Fernandez was living in her own home with her son and daughter. She was purportedly functioning quite well. She was not very physically active in her home life and this has translated into issues on the rehab unit because of increased stress about PT. She admits to lifelong anxiety and takes medication for mood control that she finds to be mildly effective. Her anxiety has been exacerbated by her current medical situation.   Tammy Fernandez son expressed concern that the rehab staff is pushing his mom too hard and requested that we take it easier with her at first so that she can  adjust. Not only her level of physical activity but her schedule in general has been greatly changed. For example, she was mainly a "night owl" and now she is up early and being asked to perform a lot of physical activity.   Of note, Tammy Fernandez also had multiple physical complaints such as decreased vision and significant pain and fatigue. She said she has discussed these symptoms with her primary physician here.   PROCEDURES ADMINISTERED: [2 units [id number] on 01/25/14] Diagnostic clinical interview  Review of available records Montreal Cognitive Assessment (MoCA)  MENTAL STATUS: Tammy Fernandez mental status exam score of 9/25 is WELL BELOW the cutoff used to indicate severe cognitive impairment or dementia. Of note, certain tasks could not be administered due to certain physical limitations and vision problems.   Emotional & Behavioral Evaluation: Tammy Fernandez was appropriately dressed for season and situation. She maintained minimal eye contact and did not spontaneously generate a lot of conversation. Her speech was as expected and she was generally able to express ideas effectively. She had trouble following directions at times. Her affect was flat and she appeared depressed and mildly stressed. Attention and motivation were adequate. Optimal test taking conditions were maintained.  From an emotional standpoint, Tammy Fernandez admitted to experiencing at least a moderate degree of anxiety and depression in light of her present medical situation. She is having a good deal of trouble adjusting to her hospital stay because of her previous lifestyle. Suicidal/homicidal ideation, plan or intent was denied. No manic or hypomanic episodes were reported. The patient denied ever experiencing any auditory/visual hallucinations. No major behavioral or personality changes were endorsed.    Overall, Tammy Fernandez appears  to be experiencing a significant degree of cognitive deficit; at the level of dementia. She is  also suffering from at least a moderate degree of depression and anxiety regarding her present medical situation. These issues were discussed at length with her son who agreed that we should start slow and eventually titrate up our expectations of Tammy Fernandez so that she can reach her goals within a reasonable amount of time. In addition, hopefully we can get her to a place where she will not be so anxious about therapy.   This plan was discussed with the patient's social worker who reportedly plans to speak to Tammy Fernandez's PT about possible options.    RECOMMENDATIONS    Since emotional factors are adversely impacting the patient's daily life, follow-up regarding her antidepressant is warranted. A dose adjustment may be beneficial. Brief counseling for social support may also be valuable during her stay.    Repeat mental status exam prior to admission to assess for interval change.     Debbe MountsAdam T. Nyle Limb, Psy.D.  Clinical Neuropsychologist

## 2014-01-27 ENCOUNTER — Inpatient Hospital Stay (HOSPITAL_COMMUNITY): Payer: Medicare Other | Admitting: Occupational Therapy

## 2014-01-27 ENCOUNTER — Inpatient Hospital Stay (HOSPITAL_COMMUNITY): Payer: PRIVATE HEALTH INSURANCE | Admitting: Occupational Therapy

## 2014-01-27 ENCOUNTER — Inpatient Hospital Stay (HOSPITAL_COMMUNITY): Payer: Medicare Other | Admitting: Rehabilitation

## 2014-01-27 ENCOUNTER — Inpatient Hospital Stay (HOSPITAL_COMMUNITY): Payer: PRIVATE HEALTH INSURANCE | Admitting: Speech Pathology

## 2014-01-27 LAB — VANCOMYCIN, RANDOM
Vancomycin Rm: 23.8 ug/mL
Vancomycin Rm: 21 ug/mL

## 2014-01-27 MED ORDER — VANCOMYCIN HCL 10 G IV SOLR
1500.0000 mg | INTRAVENOUS | Status: DC
  Administered 2014-01-28: 1500 mg via INTRAVENOUS
  Filled 2014-01-27: qty 1500

## 2014-01-27 NOTE — Progress Notes (Signed)
Social Work Patient ID: Tammy Fernandez, female   DOB: 28-Mar-1924, 80 y.o.   MRN: [id number] Pt requires a lightweight wheelchair to be able to self propel and uses for her self care, she is unable to self propel a standard weight wheelchair.

## 2014-01-27 NOTE — Progress Notes (Signed)
Physical Therapy Session Note  Patient Details  Name: Tammy Fernandez MRN: [id number] Date of Birth: 1924/11/08  Today's Date: 01/27/2014 Time: [id number] Time Calculation (min): 42 min  Short Term Goals: Week 1:  PT Short Term Goal 1 (Week 1): Pt will perform bed mobility to L and R on flat bed with mod A and 75% verbal cues for sequencing PT Short Term Goal 2 (Week 1): Pt will perform bed <> w/c transfers with consistent mod A and 75% cues for sequencing PT Short Term Goal 3 (Week 1): Pt will perform gait x 50' in controlled environment with mod A and 75% cues for safety/attention PT Short Term Goal 4 (Week 1): Pt will perform stair negotiation up/down 4 stairs with bilat rails and mod A and 75% cues for sequencing/safety  Skilled Therapeutic Interventions/Progress Updates:   Pt received sitting in w/c in room, agreeable to performing car transfer, stairs and walking with son in order to ensure safe D/C.  Assisted pt down to ortho gym and pt ambulated approx 15' to/from car with RW at supervision level (min/guard level due to brief falling down to ensure it stayed up during mobility).  Had son ambulate with her with cues for being by her side in case of LOB, however pt does not actually need physical assist to stand or ambulate.  Pt able to perform car transfer at supervision level, however requires max verbal cues to attend to task and complete task.  Then assisted pt over to stairs.  Pt states she wants her son to assist and did not want to practice first with this therapist, therefore practiced with son being pt while this therapist assisted him in manner that he would assist pt.  Son verbalized understanding and then assisted pt up/down 3, 5" steps with L handrail only to simulate home entry at min assist level.  Ended session in room with son and family friend assisting pt back to bed at min/guard level and performing bed mobility at supervision level.  Family friend requires max verbal and  tactile cues to remain by pts side when turning for increased safety.  Once in bed, requires max verbal cues to get in proper position in bed.  Pt continues to be very internally distracted during entire session with max verbal cues to re-direct.  Pt left in bed with bed alarm set and 3 bed rails up. All needs in reach.   Therapy Documentation Precautions:  Precautions Precautions: Fall Precaution Comments: increased anxiety, decreased selective attention Restrictions Weight Bearing Restrictions: No General: Amount of Missed PT Time (min): 18 Minutes Missed Time Reason: Patient unwilling/refused to participate without medical reason   Pain: Pain Assessment Pain Assessment: No/denies pain   Locomotion : Ambulation Ambulation/Gait Assistance: 5: Supervision   See FIM for current functional status  Therapy/Group: Individual Therapy  Vista Deckarcell, Miyu Fenderson Ann 01/27/2014, 2:59 PM

## 2014-01-27 NOTE — Plan of Care (Signed)
Problem: RH BLADDER ELIMINATION Goal: RH STG MANAGE BLADDER WITH ASSISTANCE STG Manage Bladder With minimal Assistance  Outcome: Not Progressing Incont. @ times

## 2014-01-27 NOTE — Progress Notes (Signed)
Speech Language Pathology Daily Session Note  Patient Details  Name: Tammy Fernandez MRN: [id number] Date of Birth: November 10, 1924  Today's Date: 01/27/2014 Time: 0922-0955 Time Calculation (min): 33 min  Short Term Goals: Week 1: SLP Short Term Goal 1 (Week 1): Patient will consume Dys.2 textures and thin liquids with Max verbal and tactile cues to utilize safe swallow strategies. SLP Short Term Goal 1 - Progress (Week 1): Progressing toward goal SLP Short Term Goal 2 (Week 1): Patient will maintain topic of conversation for 3 turns with Mod clinician cues. SLP Short Term Goal 2 - Progress (Week 1): Progressing toward goal SLP Short Term Goal 3 (Week 1): Patient will sustain attention to basic self-care task for 30 seconds with Mod verbal and visual cues. SLP Short Term Goal 3 - Progress (Week 1): Progressing toward goal SLP Short Term Goal 4 (Week 1): Patient will utilize call bell to request help with Max verbal and visual cues. SLP Short Term Goal 4 - Progress (Week 1): Progressing toward goal SLP Short Term Goal 5 (Week 1): Patient will utilize external aids for orientation with Max clinician cues. SLP Short Term Goal 5 - Progress (Week 1): Progressing toward goal  Skilled Therapeutic Interventions: Skilled intervention provided with focus on dysphagia and cognitive goals. Patient required Max encouragement to participate in therapy session.  Patient utilized external aids to assist with orientation with Mod cues.  SLP also facilitated session with trials of regular textures and thin liquids via straw.  Patient required Mod cues to attend to self-feeding task for 3 minutes and consumed trials with mildly prolonged oral phase with no overt s/s of aspiration.  Despite cognitive deficits patient's fearfulness positively impacts her safety during functional tasks.  Session with patient ended early to allow time for SLP to educate family.  SLP educated son regarding cognitive deficits: memory,  awareness, perseveration, problem solving and dysphagia.  Diet will not be advanced at this time per family request and family continues to require education prior to discharge.  Continue with current plan of care.   FIM:  Comprehension Comprehension Mode: Auditory Comprehension: 3-Understands basic 50 - 74% of the time/requires cueing 25 - 50%  of the time Expression Expression Mode: Verbal Expression: 4-Expresses basic 75 - 89% of the time/requires cueing 10 - 24% of the time. Needs helper to occlude trach/needs to repeat words. Social Interaction Social Interaction: 3-Interacts appropriately 50 - 74% of the time - May be physically or verbally inappropriate. Problem Solving Problem Solving: 3-Solves basic 50 - 74% of the time/requires cueing 25 - 49% of the time Memory Memory: 3-Recognizes or recalls 50 - 74% of the time/requires cueing 25 - 49% of the time FIM - Eating Eating Activity: 5: Set-up assist for open containers;5: Supervision/cues  Pain Pain Assessment Pain Assessment: Faces Pain Score: 0-No pain Faces Pain Scale: No hurt PAINAD (Pain Assessment in Advanced Dementia) Breathing: normal  Therapy/Group: Individual Therapy  Tammy FerrettiMelissa Chipper Fernandez, M.A., CCC-SLP [phone]  Tammy Fernandez 01/27/2014, 12:19 PM

## 2014-01-27 NOTE — Progress Notes (Signed)
Occupational Therapy Session Note  Patient Details  Name: Tammy [name] MRN: [id number] Date of Birth: Mar 27, 1924  Today's Date: 01/27/2014 Time: 1033-1120 Time Calculation (min): 47 min  Short Term Goals: Week 1:  OT Short Term Goal 1 (Week 1): Patient will complete toilet transfer with mod assist OT Short Term Goal 2 (Week 1): Patient will complete toileitng using BSC with max assist OT Short Term Goal 3 (Week 1): Patient will complete bathing, sitting and standing, using AE prn, with mod assist to maintain balance OT Short Term Goal 4 (Week 1): Patient will complete dressing sitting at edge of bed with min assist  Skilled Therapeutic Interventions/Progress Updates:  Patient resting in bed upon arrival with son at her side who left her room once self care session began. He reports that his sister, May, will be here this afternoon and plans to stay the night for education tomorrow morning.  He states, "She is a CNA and knows how to help her".   Engaged patient in sponge bath, toilet transfer, toileting and dressing.  Focused session on sustained attention, task participation, activity tolerance, bed mobility, sit><stands, standing tolerance and standing balance.  Patient required mod-max cues to stay on task and for redirection.  Patient chose to take her bath sitting EOB.  Once standing, patient incontinent of loose stool and reports being unaware.  [facility] and [facility].  After sitting on commode patient required total assist to stand 3 times for +@ from NT to clean her up and help donn a brief.  Patient required total assist for dressing today.  Therapy Documentation Precautions:  Precautions Precautions: Fall Precaution Comments: increased anxiety, decreased selective attention Restrictions Weight Bearing Restrictions: No Pain: Denies pain ADL: See FIM for current functional status  Therapy/Group: Individual Therapy  Coleby Yett 01/27/2014, 12:39 PM

## 2014-01-27 NOTE — Progress Notes (Signed)
Social Work Patient ID: Tammy Fernandez, female   DOB: 1924/08/01, 80 y.o.   MRN: [id number] Met with pt two son's and daughter to inform of team conference and making progress quicker than discharge date.  Have moved up discharge to 2/27 due to pt's reaching her goals sooner.   Son's are in agreement and feel she will do better at home at her own pace.  Discussed the need for them to do education regarding car transfers and getting up stairs prior to discharge They plan to do this today, if pt will cooperate.  Discussed home health and DME needs.  All agreeable to the plans.  Will work toward discharge on Friday.

## 2014-01-27 NOTE — Progress Notes (Signed)
Subjective/Complaints:  "I am trying" in therapy  Review of Systems - Negative except abd pain first said R side then said left side Objective: Vital Signs: Blood pressure 140/70, pulse 70, temperature 97.4 F (36.3 C), temperature source Oral, resp. rate 16, weight 85.4 kg (188 lb 4.4 oz), SpO2 92.00%. No results found. Results for orders placed during the hospital encounter of 01/19/14 (from the past 72 hour(s))  VANCOMYCIN, TROUGH     Status: Abnormal   Collection Time    01/26/14  5:05 AM      Result Value Ref Range   Vancomycin Tr 31.8 (*) 10.0 - 20.0 ug/mL   Comment: CRITICAL RESULT CALLED TO, READ BACK BY AND VERIFIED WITH:     CAMPIONE D,RN 01/26/14 0550 WAYK  VANCOMYCIN, RANDOM     Status: None   Collection Time    01/27/14  5:00 AM      Result Value Ref Range   Vancomycin Rm 23.8     Comment:            Random Vancomycin therapeutic     range is dependent on dosage and     time of specimen collection.     A peak range is 20.0-40.0 ug/mL     A trough range is 5.0-15.0 ug/mL                 General: No acute distress Mood and affect are inappropriate Heart: Regular rate and rhythm no rubs murmurs or extra sounds Lungs: Clear to auscultation, breathing unlabored, no rales or wheezes Abdomen:sluggish bowel sounds, soft nontender to palpation, nondistended Extremities: No clubbing, cyanosis, or edema Skin: No evidence of breakdown, no evidence of rash Neurologic: Cranial nerves II through XII intact, motor strength is 4/5 in bilateral deltoid, bicep, tricep, grip, hip flexor, knee extensors, ankle dorsiflexor and plantar flexor  Musculoskeletal: Full range of motion in all 4 extremities. No joint swelling Oriented to person and place, not time   Psych: anxious, repeats questions  Assessment/Plan: 1. Functional deficits secondary to small parietal infarct with metabolic encephalopathy which require 3+ hours per day of interdisciplinary therapy in a comprehensive  inpatient rehab setting. Physiatrist is providing close team supervision and 24 hour management of active medical problems listed below. Physiatrist and rehab team continue to assess barriers to discharge/monitor patient progress toward functional and medical goals. Team conference today please see physician documentation under team conference tab, met with team face-to-face to discuss problems,progress, and goals. Formulized individual treatment plan based on medical history, underlying problem and comorbidities.  FIM: FIM - Bathing Bathing Steps Patient Completed: Chest;Right upper leg;Left upper leg;Right lower leg (including foot);Right Arm;Left Arm;Abdomen;Front perineal area Bathing: 4: Min-Patient completes 8-9 1f10 parts or 75+ percent  FIM - Upper Body Dressing/Undressing Upper body dressing/undressing steps patient completed: Put head through opening of pull over shirt/dress;Thread/unthread right sleeve of pullover shirt/dresss;Thread/unthread left sleeve of pullover shirt/dress Upper body dressing/undressing: 5: Supervision: Safety issues/verbal cues FIM - Lower Body Dressing/Undressing Lower body dressing/undressing steps patient completed: Don/Doff right sock Lower body dressing/undressing: 3: Mod-Patient completed 50-74% of tasks  FIM - Toileting Toileting steps completed by patient: Adjust clothing prior to toileting Toileting: 1: Total-Patient completed zero steps, helper did all 3  FIM - TRadio producerDevices: Elevated toilet seat;Grab bars;Walker Toilet Transfers: 5-To toilet/BSC: Supervision (verbal cues/safety issues);5-From toilet/BSC: Supervision (verbal cues/safety issues)  FIM - Bed/Chair Transfer Bed/Chair Transfer Assistive Devices: Bed rails Bed/Chair Transfer: 4: Sit > Supine: Min A (  steadying pt. > 75%/lift 1 leg)  FIM - Locomotion: Wheelchair Distance: 150 total A Locomotion: Wheelchair: 0: Activity did not occur FIM -  Locomotion: Ambulation Locomotion: Ambulation Assistive Devices: Administrator Ambulation/Gait Assistance: 5: Supervision;4: Min guard Locomotion: Ambulation: 1: Travels less than 50 ft with minimal assistance (Pt.>75%)  Comprehension Comprehension Mode: Auditory Comprehension: 3-Understands basic 50 - 74% of the time/requires cueing 25 - 50%  of the time  Expression Expression Mode: Verbal Expression: 5-Expresses basic needs/ideas: With no assist  Social Interaction Social Interaction: 2-Interacts appropriately 25 - 49% of time - Needs frequent redirection.  Problem Solving Problem Solving: 2-Solves basic 25 - 49% of the time - needs direction more than half the time to initiate, plan or complete simple activities  Memory Memory: 2-Recognizes or recalls 25 - 49% of the time/requires cueing 51 - 75% of the time  Medical Problem List and Plan:  1. Small acute lacunar infarct/sepsis/metabolic encephalopathy severe cognitive deficits  2. DVT Prophylaxis/Anticoagulation: SCDs. Venous Dopplers negative  3. Pain Management: Tylenol as needed.  4. Mood/anxiety. Anxiety out of control. Increase xanax to 0.29m tid  -Neuropsych documented severe cog def, discussed with pt and son who are minimizing defs  -provided egosupport as well. Family needs to be positive as well.  - start low dose SSRI 5. Neuropsych: This patient is not capable of making decisions on her own behalf.  6. ID. Methicillin resistant coagulase-negative staph bacteremia. Continue IV vancomycin x6 weeks  7. Dysphagia. Dysphagia 3 thin liquids. Followup speech therapy  8.Hyponatremia. Resolved with latest sodium 137/Follow up labs , hypercarbic will stop O2, sats 100% recheck BMET 9. Hypertension. Lopressor 25 mg twice a day.improved on inderal,   -anxiety not helping matters 10. Hypothyroidism. Synthroid. Latest TSH is 0.187   LOS (Days) 8 A FACE TO FACE EVALUATION WAS PERFORMED  KIRSTEINS,ANDREW E 01/27/2014, 7:52  AM

## 2014-01-27 NOTE — Progress Notes (Signed)
Vancomycin per pharmacy  ID: Vanc-Rx: cannot rule out the possibility of endocarditis and embolized a small vegetation causing initial stroke syndrome. Plan Vanco x 4 weeks. WBC 11.2 on 02/18. UOP x 3., AFeb.  Vanco 2/12>>  2/14: Vanco trough: 10.3. Dose adjusted up. 2/19: VT 13, was going to change to 2000 Q24 for calc'd VT ~17 but renal fxn has been lower and plus age of patient Tammy Fernandez will change to 1750mg  for now. 2/24: VT 31.8, drawn correctly per RN, re-check VR in the AM to assess clearance 2/25: VR 23.8.  Hold for now. Reck tonight. If below 20, may consider restart at 1500mg  iv q48h.  Plan: 1) Hold vanc for now 2) Recheck vanc level this pm to reassess dosing

## 2014-01-27 NOTE — Progress Notes (Signed)
Occupational Therapy Session Note  Patient Details  Name: Tammy Fernandez MRN: [id number] Date of Birth: 13-Mar-1924  Today's Date: 01/27/2014 Time: 1500-1540 Time Calculation (min): 40 min  Skilled Therapeutic Interventions/Progress Updates:    Education session with pt's son Tammy Fernandez and Ms. Faeth's daughter's boyfriend.  Pt with increased anxiety throughout session with pt's son and therapist attempted to calm her and re-direct her.  Took her down to the ADL apartment via wheelchair to practice bed transfers to regular bed and toilet transfers.  Pt able to transfer from the wheelchair with close supervision but needs max instructional cueing for hand placement as pt tends to try and pull up on the walker.  She was able to get into the bed with supervision and roll from left to right to demonstrate her sleeping positions.  She needed min assist to roll back to her right stating "thats my weak arm I can't move it.".  However pt with no significant deficits in the arm with selfcare tasks or transfers during other sessions.  Pt then stood from the lower bed with supervision and ambulated to the toilet with close supervision using the RW.  Discussed the need for a 3:1 over the toilet at home as pt does not have elevated toilets and they are also in agreement.  Pt needing mod instructional cueing to not push the walker forward too far when walking as it increases her forward lean and takes significantly more energy to walk.     Therapy Documentation Precautions:  Precautions Precautions: Fall Precaution Comments: increased anxiety, decreased selective attention Restrictions Weight Bearing Restrictions: No  Pain: Pain Assessment Pain Assessment: No/denies pain Pain Score: 0-No pain Faces Pain Scale: No hurt PAINAD (Pain Assessment in Advanced Dementia) Breathing: normal ADL:  See FIM for current functional status  Therapy/Group: Individual Therapy  Salena Ortlieb OTR/L 01/27/2014, 1:44  PM

## 2014-01-27 NOTE — Plan of Care (Signed)
Problem: RH BOWEL ELIMINATION Goal: RH STG MANAGE BOWEL WITH ASSISTANCE STG Manage Bowel with minimal Assistance.  Outcome: Not Progressing Incont. Of soft, formed stool multiple times this shift.

## 2014-01-27 NOTE — Patient Care Conference (Signed)
Inpatient RehabilitationTeam Conference and Plan of Care Update Date: 01/27/2014   Time: 11:47 AM    Patient Name: Tammy Fernandez      Medical Record Number: [id number]  Date of Birth: 20-Jul-1924 Sex: Female         Room/Bed: 4W20C/4W20C-01 Payor Info: Payor: MEDICARE / Plan: MEDICARE PART A AND B / Product Type: *No Product type* /    Admitting Diagnosis: CVA  Admit Date/Time:  01/19/2014  7:26 PM Admission Comments: No comment available   Primary Diagnosis:  <principal problem not specified> Principal Problem: <principal problem not specified>  Patient Active Problem List   Diagnosis Date Noted  . CVA (cerebral infarction) 01/19/2014  . Dysphagia, post-stroke 01/15/2014  . Bacteremia due to Staphylococcus 01/13/2014  . Acute respiratory failure with hypoxia 01/13/2014  . Acute encephalopathy 01/12/2014  . Left hemiplegia 01/12/2014  . Hypertension 01/11/2014  . Hyponatremia 01/11/2014  . Hyperkalemia 01/11/2014  . Hematuria 01/11/2014  . E. coli UTI (urinary tract infection) 01/11/2014  . GERD (gastroesophageal reflux disease) 11/14/2011  . Obesities, morbid 11/14/2011    Expected Discharge Date: Expected Discharge Date: 01/29/14  Team Members Present: Physician leading conference: Dr. Claudette Laws Social Worker Present: Staci Acosta, LCSW;Becky Ahmar Pickrell, LCSW Nurse Present: Other (comment) Feliz Beam) PT Present: Harriet Butte, Varney Biles, PT OT Present: Perrin Maltese, OT;Kris Gellert, OT;[name] Kathline Magic, OT SLP Present: Fae Pippin, SLP PPS Coordinator present : Edson Snowball, Chapman Fitch, RN, CRRN     Current Status/Progress Goal Weekly Team Focus  Medical   anxiety, poor awareness of cog def  improve awareness  D/C training   Bowel/Bladder             Swallow/Nutrition/ Hydration   Dys.2 textures and thin liquids with full supervision   least restrictive PO intake  family education   ADL's   Pt is supervision for UB selfcare and min assist  for LB bathing, mod assist for LB dressing.  She needs supervision for toilet transfers and min assist for hygiene and clothing management.  She continues to be sleflimiting secondary to anxiety and decreased awareness.  supervision to min assist level  selfcare re-training, balance re-training, strengthening, pt/family education   Mobility   Pt requires min assist for bed mobility, supervision for standing and stand pivot transfers with RW, min/guard to close supervision for gait and mod assist for stair negotiation.   Supervision-min A overall  attention, awareness during mobility, gait, activity tolerance, pt/family education, D/C planning.    Communication   impacted by cognition      family education    Safety/Cognition/ Behavioral Observations  Mod-Max assist   goals downgraded to Mod assist   family education; reduce environmental distractions to maximize sustained attention    Pain             Skin                *See Care Plan and progress notes for long and short-term goals.  Barriers to Discharge: confusion    Possible Resolutions to Barriers:  family traininhg    Discharge Planning/Teaching Needs:  Rehab seems to be too much for pt, encouraged family to participate in therapies and try to prepare for her to go home eariler.  Family does not want to push her.      Team Discussion:  Attention/awareness issues-anxiety impedes therapy.  Reaching supervision/min level and can move up discharge as long as son's go through family education.  All in agreement with plans.  Revisions to Treatment Plan:  D/C moved up to Friday 2/28   Continued Need for Acute Rehabilitation Level of Care: The patient requires daily medical management by a physician with specialized training in physical medicine and rehabilitation for the following conditions: Daily direction of a multidisciplinary physical rehabilitation program to ensure safe treatment while eliciting the highest outcome that is of  practical value to the patient.: Yes Daily medical management of patient stability for increased activity during participation in an intensive rehabilitation regime.: Yes Daily analysis of laboratory values and/or radiology reports with any subsequent need for medication adjustment of medical intervention for : Neurological problems  Ijeoma Loor, Lemar LivingsRebecca G 01/29/2014, 11:47 AM

## 2014-01-27 NOTE — Progress Notes (Signed)
ANTIBIOTIC CONSULT NOTE - FOLLOW UP  Pharmacy Consult for Vancomycin  Indication: Bacteremia, CoNS  Allergies  Allergen Reactions  . Apple Flavor Hives    No apple sauce  . Peanut Butter Flavor Hives  . Crestor [Rosuvastatin Calcium]   . Sulfa Drugs Cross Reactors Rash     Recent Labs  01/26/14 0505 01/27/14 0500 01/27/14 1700  VANCOTROUGH 31.8*  --   --   VANCORANDOM  --  23.8 21.0    Assessment: 80 y/o on vancomycin (Day #14/28) for CoNS bacteremia. 2/24 Vancomycin trough 31.8 mcg/ml (supratherapeutic) on 1750mg  IV q24h dosing. 2/25 0500 Random vancomycin level 23.8 mcg/ml. 1700 Random vancomycin level down to 21 mcg/ml.  Based on the 2 random levels from today: ke 0.0104 and half-life = 66 hours. Using pt's ke, vancomycin level will be 16 in ~24 hours. Last SCr 0.64 on 2/18 - suspect this number is much higher now by looking at vancomycin clearance. UOP not being recorded accurately.  Goal of Therapy:  Vancomycin trough level 15-20 mcg/ml  Plan:  1) Will restart vancomycin 1500mg  IV q72h on 2/26 at 1700. 2) Will f/u BMET trend (q72h per protocol)and check level with 4th dose on new dosing regimen.  Christoper Fabianaron Darol Cush, PharmD, BCPS Clinical pharmacist, pager [phone] 01/27/2014,5:46 PM

## 2014-01-28 ENCOUNTER — Inpatient Hospital Stay (HOSPITAL_COMMUNITY): Payer: Medicare Other | Admitting: Rehabilitation

## 2014-01-28 ENCOUNTER — Inpatient Hospital Stay (HOSPITAL_COMMUNITY): Payer: Medicare Other | Admitting: Occupational Therapy

## 2014-01-28 ENCOUNTER — Inpatient Hospital Stay (HOSPITAL_COMMUNITY): Payer: PRIVATE HEALTH INSURANCE | Admitting: Speech Pathology

## 2014-01-28 ENCOUNTER — Encounter (HOSPITAL_COMMUNITY): Payer: Medicare Other | Admitting: Occupational Therapy

## 2014-01-28 LAB — BASIC METABOLIC PANEL
BUN: 8 mg/dL (ref 6–23)
CALCIUM: 9.2 mg/dL (ref 8.4–10.5)
CO2: 32 mEq/L (ref 19–32)
Chloride: 99 mEq/L (ref 96–112)
Creatinine, Ser: 1.17 mg/dL — ABNORMAL HIGH (ref 0.50–1.10)
GFR calc Af Amer: 47 mL/min — ABNORMAL LOW (ref >90)
GFR, EST NON AFRICAN AMERICAN: 41 mL/min — AB (ref >90)
GLUCOSE: 101 mg/dL — AB (ref 70–99)
Potassium: 3.1 mEq/L — ABNORMAL LOW (ref 3.7–5.3)
SODIUM: 143 meq/L (ref 137–147)

## 2014-01-28 MED ORDER — MECLIZINE HCL 12.5 MG PO TABS
12.5000 mg | ORAL_TABLET | Freq: Three times a day (TID) | ORAL | Status: DC
  Filled 2014-01-28 (×2): qty 1

## 2014-01-28 MED ORDER — PROPRANOLOL HCL 20 MG PO TABS
20.0000 mg | ORAL_TABLET | Freq: Three times a day (TID) | ORAL | Status: DC
  Administered 2014-01-28 – 2014-01-29 (×3): 20 mg via ORAL
  Filled 2014-01-28 (×6): qty 1

## 2014-01-28 MED ORDER — MECLIZINE HCL 12.5 MG PO TABS
12.5000 mg | ORAL_TABLET | Freq: Three times a day (TID) | ORAL | Status: DC | PRN
  Administered 2014-01-29: 12.5 mg via ORAL
  Filled 2014-01-28 (×2): qty 1

## 2014-01-28 MED ORDER — ALUM & MAG HYDROXIDE-SIMETH 200-200-20 MG/5ML PO SUSP
15.0000 mL | Freq: Four times a day (QID) | ORAL | Status: DC | PRN
  Administered 2014-01-29 (×2): 15 mL via ORAL
  Filled 2014-01-28 (×3): qty 30

## 2014-01-28 MED ORDER — SODIUM CHLORIDE 0.9 % IV SOLN
INTRAVENOUS | Status: DC
  Administered 2014-01-28: 12:00:00 via INTRAVENOUS

## 2014-01-28 MED ORDER — POTASSIUM CHLORIDE CRYS ER 20 MEQ PO TBCR
20.0000 meq | EXTENDED_RELEASE_TABLET | Freq: Two times a day (BID) | ORAL | Status: DC
  Administered 2014-01-28 – 2014-01-29 (×3): 20 meq via ORAL
  Filled 2014-01-28 (×5): qty 1

## 2014-01-28 NOTE — Progress Notes (Signed)
Occupational Therapy Cancellation Note  Patient Details  Name: Tammy Fernandez MRN: [id number] Date of Birth: 1924-05-26 Today's Date: 01/28/2014  Pt reporting increased nausea earlier this am and her stomach not feeling well at this time, declined participating in B/D even though daughter was here for education.  Discussed pt's current levels with the daughter, which are min -supervision.  Pt still with increased anxiety as well.  BP taken in supine 157/65.  Nursing notified of pt's report of stomach pain and not feeling well enough to participate in therapies.  Pt missed 60 mins of OT.   Amrit Cress OTR/L 01/28/2014, 11:29 AM

## 2014-01-28 NOTE — Progress Notes (Signed)
Occupational  Therapy Note  Patient Details  Name: Tammy BeachRosa L Doody MRN: [id number] Date of Birth: [birth year] Today's Date: 01/28/2014  Pt missed 30 mins OT session secondary to not feeling well and not wanting to get up OOB.  Discussed with both daughter and son on DME she will receive for home as well as follow-up HHOT.  Jasean Ambrosia OTR/L 01/28/2014, 2:18 PM

## 2014-01-28 NOTE — Plan of Care (Signed)
Problem: RH Memory Goal: LTG Patient demonstrate ability for day to day recall (PT) LTG: Patient will demonstrate ability for day to day recall/carryover during mobility activities with assist (PT)  Outcome: Not Met (add Reason) Pt requires max to total assist cues for remembering correct sequence and technique for all aspects of mobility during therapy sessions.    Problem: RH Attention Goal: LTG Patient will demonstrate focused/sustained (PT) LTG: Patient will demonstrate focused/sustained/selective/alternating/divided attention during functional mobility in specific environment with assist for # of minutes (PT)  Outcome: Not Met (add Reason) Pt only able to demonstrate sustained attention x approx 2 mins before being internally or externally distracted.

## 2014-01-28 NOTE — Progress Notes (Signed)
Occupational Therapy Discharge Summary  Patient Details  Name: KAFI DOTTER MRN: [id number] Date of Birth: Jun 13, 1924  Today's Date: 01/28/2014 Time: 1300-1330 Time Calculation (min): 30 min  Patient has met 9 of 11 long term goals due to improved activity tolerance and improved balance.  Patient to discharge at [facility] Assist level.  Patient's care partner is independent to provide the necessary physical and cognitive assistance at discharge.    Reasons goals not met: Pt continues to need mod to max instructional cueing for awareness at this current time.  She also needs mod facilitation for LB dressing.  Recommendation:  Patient will benefit from ongoing skilled OT services in home health setting to continue to advance functional skills in the area of BADL.  Pt still with decreased overall independence with selfcare tasks.  Pt with severe increased anxiety during sessions and is easily internally distracted.  Pt needs lots of reassurance and family support to complete activities.  Overall she can perform all functional transfers at a supervision level but still demonstrates the need for assistance with dressing her LLE.  She also continues to demonstrate generalized weakness in her UEs with the left being greater than the right.  Feel pt will need 24 hour supervision at discharge, which family will provide.    Equipment: 3:1  Reasons for discharge: treatment goals met and discharge from hospital  Patient/family agrees with progress made and goals achieved: Yes  OT Discharge Precautions/Restrictions  Precautions Precautions: Fall Precaution Comments: increased anxiety, decreased selective attention Restrictions Weight Bearing Restrictions: No  Pain Pain Assessment Pain Assessment: Faces Faces Pain Scale: Hurts even more Pain Type: Acute pain Pain Location: Abdomen Pain Orientation: Lower Pain Intervention(s): RN made aware ADL ADL ADL Comments: see  FIM Vision/Perception  Vision - History Baseline Vision: Wears glasses all the time Vision - Assessment Eye Alignment: Within Functional Limits Vision Assessment: Vision not tested (vision not assessed at discharge) Perception Perception: Within Functional Limits Praxis Praxis: Intact  Cognition Overall Cognitive Status: Impaired/Different from baseline Arousal/Alertness: Awake/alert Orientation Level: Oriented to person;Oriented to place;Disoriented to time;Oriented to situation Attention: Focused;Sustained Focused Attention: Appears intact Focused Attention Impairment: Functional basic Sustained Attention: Impaired Sustained Attention Impairment: Functional basic Memory: Impaired Memory Impairment: Storage deficit;Retrieval deficit;Decreased recall of new information;Decreased short term memory Decreased Short Term Memory: Functional basic;Verbal basic Awareness: Impaired Awareness Impairment: Intellectual impairment Problem Solving: Impaired Problem Solving Impairment: Verbal basic;Functional basic Executive Function:  (all remain impaired due to lower level deficits) Reasoning: Impaired Reasoning Impairment: Functional basic Sequencing: Impaired Sequencing Impairment: Functional basic Behaviors: Other (comment) (Anxiety) Safety/Judgment: Impaired Comments: Pt with severe anxiety throughout all therapy sessions.  Pt internally distracted by her own worries.   Sensation Sensation Light Touch: Impaired by gross assessment Stereognosis: Not tested Hot/Cold: Not tested Proprioception: Impaired by gross assessment Coordination Gross Motor Movements are Fluid and Coordinated: Yes Fine Motor Movements are Fluid and Coordinated: No Coordination and Movement Description: Pt with slight LUE weakness compared to the right. Motor  Motor Motor: Hemiplegia;Abnormal postural alignment and control Motor - Discharge Observations: Pt continues to demonstrate decreased activity  tolerance secondary to internal distractions and decreased overall endurance.  She maintains flexed posture in the head and trunk as well.  Mobility  Bed Mobility Bed Mobility: Supine to Sit;Sit to Supine Supine to Sit: 5: Supervision Supine to Sit Details: Verbal cues for sequencing;Verbal cues for technique;Verbal cues for precautions/safety Sit to Supine: 5: Supervision Sit to Supine - Details: Verbal cues for sequencing;Verbal cues for  technique;Verbal cues for precautions/safety Transfers Transfers: Sit to Stand;Stand to Sit Sit to Stand: 5: Supervision Sit to Stand Details: Verbal cues for sequencing;Verbal cues for technique;Verbal cues for precautions/safety Stand to Sit: 5: Supervision Stand to Sit Details (indicate cue type and reason): Verbal cues for sequencing;Verbal cues for technique  Trunk/Postural Assessment  Cervical Assessment Cervical Assessment: Exceptions to [facility] Cervical Strength Overall Cervical Strength Comments: Pt with forward head and neck as well as she keeps her head tilted to the right side. Thoracic Assessment Thoracic Assessment: Exceptions to [facility] Thoracic Strength Overall Thoracic Strength Comments: Pt with rounded thoracic spine in sitting and standing. Postural Control Postural Control: Deficits on evaluation Trunk Control: stands with R lateral lean, flexed transfer with  forward head and shoulders  Balance Balance Balance Assessed: Yes Static Sitting Balance Static Sitting - Balance Support: No upper extremity supported Static Sitting - Level of Assistance: 6: Modified independent (Device/Increase time) Dynamic Sitting Balance Dynamic Sitting - Balance Support: Feet supported;No upper extremity supported Dynamic Sitting - Level of Assistance: 6: Modified independent (Device/Increase time) Static Standing Balance Static Standing - Balance Support: Bilateral upper extremity supported Static Standing - Level of Assistance: 5: Stand by  assistance Dynamic Standing Balance Dynamic Standing - Balance Support: During functional activity Dynamic Standing - Level of Assistance: 5: Stand by assistance Extremity/Trunk Assessment RUE Assessment RUE Assessment: Exceptions to [facility] RUE Strength RUE Overall Strength Comments: Pt with generalized weakness at 3/5 throughout. LUE Assessment LUE Assessment: Exceptions to [facility] LUE Strength LUE Overall Strength Comments: Pt with strength of 3-/5 shoulder flexion and 3/5 for elbow flexion/extension and for grip.  See FIM for current functional status  Beverly Suriano OTR/L 01/28/2014, 4:11 PM

## 2014-01-28 NOTE — Discharge Summary (Signed)
NAMNoreene Filbert:  [place], [place]                ACCOUNT NO.:  [id number]  MEDICAL RECORD NO.:  [id number]  LOCATION:  4W20C                        FACILITY:  [facility]  PHYSICIAN:  Erick ColaceAndrew E. Kirsteins, M.D.DATE OF BIRTH:  Jan 06, 1924  DATE OF ADMISSION:  01/19/2014 DATE OF DISCHARGE:  01/29/2014                              DISCHARGE SUMMARY   DISCHARGE DIAGNOSES: 1. Small acute lacunar infarction with sepsis. 2. Sequential compression devices for deep venous thrombosis     prophylaxis, pain management. 3. Anxiety. 4. Methicillin-resistant coagulase-negative Staph bacteremia. 5. Dysphagia. 6. Hyponatremia, resolved. 7. Hypertension. 8. Hypothyroidism.  HISTORY OF PRESENT ILLNESS:  This 80 year old right-handed female history of hypertension, independent prior to admission and active. Admitted on January 11, 2014, with altered mental status, staring to the right, not moving her left side per family report.  Noted sodium level 115, potassium 5.7.  MRI of the brain on January 11, 2014, showed no convincing evidence to suggest acute ischemia.  However, there was mild bright signal within the right temporal cortex.  Echocardiogram with ejection fraction 60% grade 1 diastolic dysfunction without embolus. EEG was negative for seizure.  Carotid Dopplers with no ICA stenosis. Placed on aspirin for CVA prophylaxis.  A repeat MRI of the brain, January 14, 2014, showed small acute lacunar infarct in the right parietal lobe, posterior right MCA territory, advised to continue aspirin.  Maintained on normal saline, IV fluids for hyponatremia. Renal Service followup for hyponatremia, question element of SIADH and workup ongoing.  Urine culture greater than 100,000 Gram-negative rods, placed on Rocephin.  Blood cultures x2 on admission of methicillin- resistant coagulase-negative Staph bacteremia with Infectious Disease consulted, placed on Rocephin changed to intravenous vancomycin x6 weeks.  TEE completed  showing no evidence of endocarditis or thrombus. Venous Doppler studies lower extremities negative.  Physical and occupational therapy is ongoing.  The patient was admitted for comprehensive rehab program.  SOCIAL HISTORY:  Lives with family.  FUNCTIONAL HISTORY:  Prior to admission independent.  Functional status upon admission to Rehab Services was ambulating 2 feet requiring +1 total assist upper body, +2 total assist, lower body dressing.  PHYSICAL EXAMINATION:  VITAL SIGNS:  Blood pressure 182/72, pulse 79, temperature 98.6, respirations 16. GENERAL:  This was an alert female, in no acute distress.  She was alert, improved, basic insight and awareness. LUNGS:  Clear to auscultation. CARDIAC:  Regular rate and rhythm. ABDOMEN:  Soft, nontender.  Good bowel sounds.  REHABILITATION HOSPITAL COURSE:  The patient was admitted to Inpatient Rehab Services with therapies initiated on a 3-hour daily basis consisting of physical therapy, occupational therapy, speech therapy, and rehabilitation nursing.  The following issues were addressed during patient's rehabilitation stay.  Pertaining to patient's small acute lacunar infarct, remained on aspirin therapy.  Sequential compression devices for DVT prophylaxis.  Venous Doppler studies negative.  She did have a history of anxiety using Xanax as needed at bedtime which had since been discontinued, placed on Zoloft.  She remained on vancomycin x6 weeks total for methicillin-resistant Coag-negative Staph bacteremia that she would continue until February 07, 2014, with home health nurse provided and follow up Infectious Disease.  Blood pressures monitored on Inderal, her Lopressor had  been discontinued due to some hypotension. She remained on hormone supplement for hypothyroidism.  Her Xanax was later resumed 3 times daily for her anxiety as she attended therapies. Hyponatremia improved with latest sodium levels of 143.  Patient received weekly  collaborative interdisciplinary team conferences to discuss estimated length of stay, family teaching, and any barriers to her discharge.  The patient ambulating 15 feet to and from car with rolling walker, supervision level, had son ambulate with her with some cues for being by her side in case of loss of balance.  Strength and endurance continued to improve needing some verbal cues for safety. Activities of daily living, she was able to transfer from the wheelchair with close supervision, but needs max instructional cuing for hand placement.  Full family teaching was completed, plan was to be discharged to home.  DISCHARGE MEDICATIONS:  Included: 1. Xanax 0.5 mg p.o. t.i.d. 2. Aspirin 325 mg p.o. daily. 3. Synthroid 50 mcg p.o. daily. 4. Naphcon ophthalmic solution 1 drop both eyes 3 times daily. 5. Protonix 40 mg p.o. daily. 6. MiraLax 17 g daily hold for loose stool. 7. Inderal 10 mg p.o. t.i.d. 8. Zoloft 25 mg p.o. daily. 9. Vancomycin 1500 mg adjusted accordingly until 02/07/2014 and stop.  FOLLOWUP:  [facility] Claud[facility] March 09, 2014; Dr. Delia Heady, Neurology Service call for appointment; Dr. Cliffton Asters, Infectious Disease, call for appointment in 1 month; Dr. Elias Else, February 01, 2014.  Her diet was regular.     Mariam Dollar, P.A.   ______________________________ Erick Colace, M.D.    DA/MEDQ  D:  01/28/2014  T:  01/28/2014  Job:  [id number]  cc:   Molly Maduro A. Nicholos Johns, M.D. Cliffton Asters, M.D. Pramod P. Pearlean Brownie, MD

## 2014-01-28 NOTE — Progress Notes (Signed)
Patient states has had complaints in the past of dizziness when out of bed with therapy, and takes Meclizine 12.5 at home. Although currently patient states has no complaints of dizziness. Also patient states at home she takes allergy medication called Chlortrimeton over-the-counter medication at home, was to take medication tonight. Notified Dan, A PA made aware of past complaints of dizziness given order for Meclizine 12.5 TID prn, and given order for Chlortrimeton.  Chlortrimeton medication counted and taken to pharmacy to dispense. Will continue to monitor patient. [place]

## 2014-01-28 NOTE — Progress Notes (Signed)
Speech Language Pathology Discharge Summary  Patient Details  Name: Tammy Fernandez MRN: [id number] Date of Birth: 1924-10-03  Today's Date: 01/28/2014 Time: 4847-2072 Time Calculation (min): 35 min  Skilled Therapeutic Interventions:   Skilled intervention provided with focus on patient and family education. Patient required Max encouragement to participate in therapy session; daughter encouraged participation.  Patient utilized external aids to assist with orientation with Min verbal and visual cues.  SLP also facilitated session with set-up and Min verbal cues to check for pocketing and attend to self-feeding while she consumed Dys.2 textures and thin liquids via straw.  Patient demonstrated no overt s/s of aspiration.  SLP educated patient and daughter regarding her ability to consume Dys.3 textures; however, she frequently refuses or her son refuses for her.  Daughter educated on cognitive deficits; verbal abilities not matching functional abilities due to memory and awareness.  Daughter also educated on impact of home management with need for 24/7 supervision.  Daughter verbalized understanding and demonstrated ability to redirect and reassure patient regarding medications during session.    Patient has met 7 of 9 long term goals.  Patient to discharge at overall Mod;Min level.  Reasons goals not met: Pt continues to require Min assist with PO and Mod assist with sustained attention due to pt distracting herself with her verbose, tangential expression.     Clinical Impression/Discharge Summary:    Patient met 7 out of 9 goals during her CIR stay due to gains in overall cognitive recovery.  Patient's diet progression was limited due to her and her family's willingness to try advanced textures; however, the minimal trials of regular and thin that SLP observed were [facility].  It is recommended that patient consume Dys.3 textures with thin liquids and full supervision upon discharge.  Patient's  willingness to participate and ability to sustain attention to tasks are her most limiting factors.  She will require 24/7 Supervision upon discharge and Mod assist with cognitive based tasks due to ongoing impairments in sustained attention, intellectual awareness, recall of information and overall ability to safely solve basic problem related to safe self-care.   Family education completed with patient's daughter; patient and family ready for discharge.   It is recommended that this patient receive skilled SLP services at the next level of care to maximize functional independence and reduce overall burden of care.  Care Partner:  Caregiver Able to Provide Assistance: Yes  Type of Caregiver Assistance: Cognitive;Physical  Recommendation:  Home Health SLP;24 hour supervision/assistance  Rationale for SLP Follow Up: Maximize cognitive function and independence;Maximize swallowing safety;Reduce caregiver burden;Maximize functional communication   Equipment:  None  Reasons for discharge: Treatment goals met;Discharged from hospital   Patient/Family Agrees with Progress Made and Goals Achieved: Yes   See FIM for current functional status  Carmelia Roller., CCC-SLP [phone]  [place] 01/28/2014, 9:59 AM

## 2014-01-28 NOTE — Discharge Summary (Signed)
Discharge summary job # [id number]

## 2014-01-28 NOTE — Plan of Care (Signed)
Problem: RH Dressing Goal: LTG Patient will perform lower body dressing w/assist (OT) LTG: Patient will perform lower body dressing with assist, with/without cues in positioning using equipment (OT)  Outcome: Not Met (add Reason) She still requires min to mod assist.  Problem: RH Awareness Goal: LTG: Patient will demonstrate intellectual/emergent (OT) LTG: Patient will demonstrate intellectual/emergent/anticipatory awareness with assist during a functional activity (OT)  Outcome: Not Met (add Reason) Needs max cueing for emergent awareness.

## 2014-01-28 NOTE — Progress Notes (Signed)
Physical Therapy Discharge Summary  Patient Details  Name: Tammy Fernandez MRN: [id number] Date of Birth: Apr 16, 1924  Today's Date: 01/28/2014 Time: 1300-1330 Time Calculation (min): 30 min  Patient has met 10 of 12 long term goals due to improved balance and increased strength.  Many of this pts goals were downgraded due to her decreased memory, attention, awareness and safety.  Patient to discharge at an ambulatory level Supervision.   Patient's care partner is independent to provide the necessary cognitive assistance at discharge.  Reasons goals not met: Pt unable to meet memory and selective attention goals as she demonstrates poor carryover between sessions and requires max to total assist cues at times to recall safe techniques for all aspects of mobility.  Also note that she is unable to maintain sustained attention for longer than 2-3 mins without being internally or externally distracted.    Recommendation:  Patient will benefit from ongoing skilled PT services in home health setting to continue to advance safe functional mobility, address ongoing impairments in decreased balance, decreased overall strength, decreased activity tolerance, decreased awareness, decreased memory, and decreased attention, and minimize fall risk.  Equipment: w/c, cushion, and RW  Reasons for discharge: treatment goals met and discharge from hospital  Patient/family agrees with progress made and goals achieved: Yes  PT treatment/Intervention: Pt received lying in bed, refusing to get OOB to participate in family training this afternoon, therefore had lengthy discussion with pts daughter and son about assisting her with bed mobility, in/out of car, ambulating with her, cues to provide for safety and good technique, stair training, ensuring paved way was clear for pt at time of D/C.  Also discussed her progress in therapy and that her progress has been self limiting and her motivation to participate has been  low despite max encouragement from therapist.  Also discussed her perseveration and the need to re-direct her during tasks.  Also discussed how she will likely ask for assist, however can perform all mobility at supervision level, but may need "a hand on her" to initiate movement.  Also went over how to manage w/c parts and get into car.   Pts son and daughter verbalized understanding and despite max encouragement to have pts daughter participate in hands on training, daughter did not want to "make pt get up."  Therefore discussed all things above.  Pt left in bed with family in room.   PT Discharge Precautions/Restrictions Precautions Precautions: Fall Precaution Comments: increased anxiety, decreased selective attention Restrictions Weight Bearing Restrictions: No Vital Signs   Pain Pain Assessment Pain Assessment: Faces Faces Pain Scale: Hurts even more Pain Type: Acute pain Pain Location: Abdomen Pain Orientation: Lower Pain Intervention(s): RN made aware Vision/Perception     Cognition Overall Cognitive Status: Impaired/Different from baseline Arousal/Alertness: Awake/alert Orientation Level: Oriented to person;Oriented to place;Disoriented to time;Oriented to situation Focused Attention: Appears intact Sustained Attention: Impaired Sustained Attention Impairment: Verbal basic;Verbal complex Memory: Impaired Memory Impairment: Storage deficit;Retrieval deficit;Decreased recall of new information;Decreased short term memory Decreased Short Term Memory: Functional basic;Verbal basic Awareness: Impaired Awareness Impairment: Intellectual impairment Problem Solving: Impaired Problem Solving Impairment: Verbal basic;Functional basic Executive Function:  (all remain impaired due to lower level deficits) Reasoning: Impaired Reasoning Impairment: Functional basic Sequencing: Impaired Sequencing Impairment: Functional basic Behaviors: Perseveration Safety/Judgment:  Impaired Comments: Pt with no awareness of deficits and states today "I don't need a w/c or walker" Sensation Sensation Light Touch: Impaired by gross assessment Stereognosis: Not tested Hot/Cold: Not tested Proprioception: Impaired by gross assessment  Coordination Gross Motor Movements are Fluid and Coordinated: Yes Fine Motor Movements are Fluid and Coordinated: No Coordination and Movement Description: pt continues to have weakness in LLE  Motor  Motor Motor: Hemiplegia;Abnormal postural alignment and control Motor - Discharge Observations: Pt continues to demonstrate decreased activity tolerance, decreased balance and decreased strength in LLE during gait.  Also note increased R lateral lean past two days in therapy.   Mobility Bed Mobility Bed Mobility: Supine to Sit;Sit to Supine (as of 01/27/14) Supine to Sit: 5: Supervision Supine to Sit Details: Verbal cues for sequencing;Verbal cues for technique;Verbal cues for precautions/safety Sit to Supine: 5: Supervision Sit to Supine - Details: Verbal cues for sequencing;Verbal cues for technique;Verbal cues for precautions/safety Transfers Transfers: Yes (as of 01/27/14) Sit to Stand: 5: Supervision Sit to Stand Details: Verbal cues for sequencing;Verbal cues for technique;Verbal cues for precautions/safety Stand Pivot Transfers: 5: Supervision Stand Pivot Transfer Details: Verbal cues for sequencing;Verbal cues for technique;Verbal cues for precautions/safety Locomotion  Ambulation Ambulation: Yes Ambulation/Gait Assistance: 5: Supervision Ambulation Distance (Feet): 70 Feet (as of last week) Assistive device: Rolling walker Ambulation/Gait Assistance Details: Verbal cues for sequencing;Verbal cues for technique;Verbal cues for precautions/safety;Verbal cues for safe use of DME/AE;Verbal cues for gait pattern Gait Gait: Yes Gait Pattern: Impaired Gait Pattern: Step-through pattern;Decreased step length - right;Decreased step  length - left;Decreased stride length;Lateral trunk lean to right;Decreased trunk rotation;Trunk flexed Stairs / Additional Locomotion Stairs: Yes (as of 01/27/14) Stairs Assistance: 4: Min assist Stairs Assistance Details: Verbal cues for sequencing;Verbal cues for technique;Verbal cues for precautions/safety;Verbal cues for safe use of DME/AE;Verbal cues for gait pattern Stair Management Technique: One rail Left;Step to pattern;Forwards Number of Stairs: 4 Height of Stairs: 6 Wheelchair Mobility Wheelchair Mobility: No Distance: as of 01/28/14  Trunk/Postural Assessment  Postural Control Postural Control: Deficits on evaluation Trunk Control: stands with R lateral lean, forward trunk lean, forward head and shoulders  Balance Balance Balance Assessed: Yes Dynamic Sitting Balance Dynamic Sitting - Balance Support: Feet supported;No upper extremity supported Dynamic Sitting - Level of Assistance: 6: Modified independent (Device/Increase time) Static Standing Balance Static Standing - Balance Support: Bilateral upper extremity supported Static Standing - Level of Assistance: 5: Stand by assistance Dynamic Standing Balance Dynamic Standing - Balance Support: During functional activity Dynamic Standing - Level of Assistance: 5: Stand by assistance Extremity Assessment      RLE Assessment RLE Assessment: Within Functional Limits (generalized weakness) LLE Assessment LLE Assessment: Within Functional Limits (generalized weakness)  See FIM for current functional status  Denice Bors 01/28/2014, 1:49 PM

## 2014-01-29 MED ORDER — MECLIZINE HCL 12.5 MG PO TABS: 12.5000 mg | ORAL_TABLET | Freq: Three times a day (TID) | ORAL | Status: DC | PRN

## 2014-01-29 MED ORDER — PROPRANOLOL HCL ER 60 MG PO CP24: 60.0000 mg | ORAL_CAPSULE | Freq: Every day | ORAL | Status: DC

## 2014-01-29 MED ORDER — PANTOPRAZOLE SODIUM 40 MG PO TBEC: 40.0000 mg | DELAYED_RELEASE_TABLET | Freq: Every day | ORAL | Status: DC

## 2014-01-29 MED ORDER — LEVOTHYROXINE SODIUM 50 MCG PO TABS: 50.0000 ug | ORAL_TABLET | Freq: Every day | ORAL | Status: DC

## 2014-01-29 MED ORDER — VANCOMYCIN HCL 10 G IV SOLR: INTRAVENOUS | Status: DC

## 2014-01-29 MED ORDER — NAPHAZOLINE HCL 0.1 % OP SOLN: 1.0000 [drp] | Freq: Three times a day (TID) | OPHTHALMIC | Status: DC

## 2014-01-29 MED ORDER — LEVOTHYROXINE SODIUM 50 MCG PO TABS
50.0000 ug | ORAL_TABLET | Freq: Every day | ORAL | Status: DC
  Filled 2014-01-29: qty 1

## 2014-01-29 MED ORDER — ASPIRIN 325 MG PO TABS: 325.0000 mg | ORAL_TABLET | Freq: Every day | ORAL | Status: DC

## 2014-01-29 MED ORDER — ALPRAZOLAM 0.5 MG PO TABS: 0.5000 mg | ORAL_TABLET | Freq: Three times a day (TID) | ORAL | Status: DC

## 2014-01-29 MED ORDER — PROPRANOLOL HCL ER 60 MG PO CP24
60.0000 mg | ORAL_CAPSULE | Freq: Every day | ORAL | Status: DC
Start: 2014-01-29 — End: 2014-01-29
  Filled 2014-01-29 (×2): qty 1

## 2014-01-29 NOTE — Discharge Instructions (Signed)
Inpatient Rehab Discharge Instructions  Tammy Fernandez Discharge date and time: No discharge date for patient encounter.   Activities/Precautions/ Functional Status: Activity: activity as tolerated Diet: Soft Wound Care: none needed Functional status:  ___ No restrictions     ___ Walk up steps independently _x__ 24/7 supervision/assistance   ___ Walk up steps with assistance ___ Intermittent supervision/assistance  ___ Bathe/dress independently ___ Walk with walker     ___ Bathe/dress with assistance ___ Walk Independently    ___ Shower independently _x__ Walk with assistance    ___ Shower with assistance ___ No alcohol     ___ Return to work/school ________  Special Instructions: Continue intravenous vancomycin until 02/07/2014 and stopped    COMMUNITY REFERRALS UPON DISCHARGE:    Home Health:   PT, OT, SPT, RN   Agency:[facility] [phone] Date of last service:01/29/2014   Medical Equipment/Items Ordered:WHEELCHAIR, Levan Hurst, [facility]  Agency/Supplier:[facility]   [phone]   GENERAL COMMUNITY RESOURCES FOR PATIENT/FAMILY: Support Groups:[facility] SUPPORT GROUP   My questions have been answered and I understand these instructions. I will adhere to these goals and the provided educational materials after my discharge from the hospital.  Patient/Caregiver Signature _______________________________ Date __________  Clinician Signature _______________________________________ Date __________  Please bring this form and your medication list with you to all your follow-up doctor's appointments.  STROKE/TIA DISCHARGE INSTRUCTIONS SMOKING Cigarette smoking nearly doubles your risk of having a stroke & is the single most alterable risk factor  If you smoke or have smoked in the last 12 months, you are advised to quit smoking for your health.  Most of the excess cardiovascular risk related to smoking disappears within a year of stopping.  Ask you doctor about  anti-smoking medications  [place] Quit Line: 1-800-QUIT NOW  Free Smoking Cessation Classes [phone]  CHOLESTEROL Know your levels; limit fat & cholesterol in your diet  Lipid Panel     Component Value Date/Time   CHOL 165 01/12/2014 0606   TRIG 79 01/12/2014 0606   HDL 38* 01/12/2014 0606   CHOLHDL 4.3 01/12/2014 0606   VLDL 16 01/12/2014 0606   LDLCALC 111* 01/12/2014 0606      Many patients benefit from treatment even if their cholesterol is at goal.  Goal: Total Cholesterol (CHOL) less than 160  Goal:  Triglycerides (TRIG) less than 150  Goal:  HDL greater than 40  Goal:  LDL (LDLCALC) less than 100   BLOOD PRESSURE American Stroke Association blood pressure target is less that 120/80 mm/Hg  Your discharge blood pressure is:  BP: 140/70 mmHg  Monitor your blood pressure  Limit your salt and alcohol intake  Many individuals will require more than one medication for high blood pressure  DIABETES (A1c is a blood sugar average for last 3 months) Goal HGBA1c is under 7% (HBGA1c is blood sugar average for last 3 months)  Diabetes: No known diagnosis of diabetes    Lab Results  Component Value Date   HGBA1C 6.6* 01/12/2014     Your HGBA1c can be lowered with medications, healthy diet, and exercise.  Check your blood sugar as directed by your physician  Call your physician if you experience unexplained or low blood sugars.  PHYSICAL ACTIVITY/REHABILITATION Goal is 30 minutes at least 4 days per week  Activity: Walk with assistance, Therapies: Physical Therapy: Home Health Return to work:   Activity decreases your risk of heart attack and stroke and makes your heart stronger.  It helps control your  weight and blood pressure; helps you relax and can improve your mood.  Participate in a regular exercise program.  Talk with your doctor about the best form of exercise for you (dancing, walking, swimming, cycling).  DIET/WEIGHT Goal is to maintain a healthy weight  Your  discharge diet is: Dysphagia  liquids Your height is:    Your current weight is: Weight: 85.4 kg (188 lb 4.4 oz) Your Body Mass Index (BMI) is:     Following the type of diet specifically designed for you will help prevent another stroke.  Your goal weight range is:    Your goal Body Mass Index (BMI) is 19-24.  Healthy food habits can help reduce 3 risk factors for stroke:  High cholesterol, hypertension, and excess weight.  RESOURCES Stroke/Support Group:  Call [phone]   STROKE EDUCATION PROVIDED/REVIEWED AND GIVEN TO PATIENT Stroke warning signs and symptoms How to activate emergency medical system (call 911). Medications prescribed at discharge. Need for follow-up after discharge. Personal risk factors for stroke. Pneumonia vaccine given:  Flu vaccine given:  My questions have been answered, the writing is legible, and I understand these instructions.  I will adhere to these goals & educational materials that have been provided to me after my discharge from the hospital.

## 2014-01-29 NOTE — Progress Notes (Signed)
Social Work Discharge Note Discharge Note  The overall goal for the admission was met for:   Discharge location: [facility]  Length of Stay: Yes-10 DAYS  Discharge activity level: Yes-MIN/MOD LEVEL  Home/community participation: Yes  Services provided included: MD, RD, PT, OT, SLP, RN, CM, TR, Pharmacy, Neuropsych and SW  Financial Services: Medicaid and Private Insurance: [place]  Follow-up services arranged: Home Health: [facility]-PT,OT,SPT,RN, DME: [facility]-WHEELCHAIR,ROLLING WALKER, BSC and Patient/Family request agency [facility]: [facility], [facility]ME: [facility]  Comments (or additional information):FAMILY EDUCATION COMPLETED, AWARE RECOMMENDATION IS 24 [facility] C[facility]. ALL COMFORTABLE WITH HER CARE AND READY FOR D/C.  SON AWARE TO GET CANE ON THEIR OWN IF NEEDED.  Patient/Family verbalized understanding of follow-up arrangements: Yes  Individual responsible for coordination of the follow-up plan: JEROME-SON  Confirmed correct DME delivered: Elease Hashimoto 01/29/2014    Elease Hashimoto

## 2014-01-29 NOTE — Progress Notes (Addendum)
Subjective/Complaints:  Pt c/o shortness of breath , CP, tooth pain, nausea. No diaphoresis, anxious Discussed with pt, RN and son. Review of Systems - Negative except abd pain first said R side then said left side Objective: Vital Signs: Blood pressure 158/65, pulse 114, temperature 98.2 F (36.8 C), temperature source Oral, resp. rate 16, weight 85.4 kg (188 lb 4.4 oz), SpO2 93.00%. No results found. Results for orders placed during the hospital encounter of 01/19/14 (from the past 72 hour(s))  VANCOMYCIN, RANDOM     Status: None   Collection Time    01/27/14  5:00 AM      Result Value Ref Range   Vancomycin Rm 23.8     Comment:            Random Vancomycin therapeutic     range is dependent on dosage and     time of specimen collection.     A peak range is 20.0-40.0 ug/mL     A trough range is 5.0-15.0 ug/mL             VANCOMYCIN, RANDOM     Status: None   Collection Time    01/27/14  5:00 PM      Result Value Ref Range   Vancomycin Rm 21.0     Comment:            Random Vancomycin therapeutic     range is dependent on dosage and     time of specimen collection.     A peak range is 20.0-40.0 ug/mL     A trough range is 5.0-15.0 ug/mL             BASIC METABOLIC PANEL     Status: Abnormal   Collection Time    01/28/14  5:43 AM      Result Value Ref Range   Sodium 143  137 - 147 mEq/L   Potassium 3.1 (*) 3.7 - 5.3 mEq/L   Chloride 99  96 - 112 mEq/L   CO2 32  19 - 32 mEq/L   Glucose, Bld 101 (*) 70 - 99 mg/dL   BUN 8  6 - 23 mg/dL   Creatinine, Ser 1.17 (*) 0.50 - 1.10 mg/dL   Calcium 9.2  8.4 - 10.5 mg/dL   GFR calc non Af Amer 41 (*) >90 mL/min   GFR calc Af Amer 47 (*) >90 mL/min   Comment: (NOTE)     The eGFR has been calculated using the CKD EPI equation.     This calculation has not been validated in all clinical situations.     eGFR's persistently <90 mL/min signify possible Chronic Kidney     Disease.      General: No acute distress Mood and affect  are inappropriate Heart: Regular rate and rhythm no rubs murmurs or extra sounds, pectoralis pain with palp Lungs: Clear to auscultation, breathing unlabored, no rales or wheezes Abdomen:sluggish bowel sounds, soft nontender to palpation, nondistended Extremities: No clubbing, cyanosis, or edema Skin: No evidence of breakdown, no evidence of rash Neurologic: Cranial nerves II through XII intact, motor strength is 4/5 in bilateral deltoid, bicep, tricep, grip, hip flexor, knee extensors, ankle dorsiflexor and plantar flexor  Musculoskeletal: Full range of motion in all 4 extremities. No joint swelling Oriented to person and place, not time   Psych: anxious, repeats questions  Assessment/Plan: 1. Functional deficits secondary to small parietal infarct with metabolic encephalopathy which require 3+ hours per day of interdisciplinary therapy in a  comprehensive inpatient rehab setting.  Stable for D/C today F/u PCP in 1-2 weeks F/u PM&R 3 weeks See D/C summary See D/C instructions  FIM: FIM - Bathing Bathing Steps Patient Completed: Chest;Right upper leg;Left upper leg;Right Arm;Left Arm;Abdomen;Front perineal area Bathing: 3: Mod-Patient completes 5-7 50f10 parts or 50-74%  FIM - Upper Body Dressing/Undressing Upper body dressing/undressing steps patient completed: Put head through opening of pull over shirt/dress;Thread/unthread right sleeve of pullover shirt/dresss;Thread/unthread left sleeve of pullover shirt/dress Upper body dressing/undressing: 1: Total-Patient completed less than 25% of tasks FIM - Lower Body Dressing/Undressing Lower body dressing/undressing steps patient completed: Don/Doff right sock Lower body dressing/undressing: 1: Total-Patient completed less than 25% of tasks  FIM - Toileting Toileting steps completed by patient: Adjust clothing prior to toileting Toileting: 1: Two helpers  FIM - TRadio producerDevices: Elevated toilet  seat;Grab bars;Walker Toilet Transfers: 5-To toilet/BSC: Supervision (verbal cues/safety issues);5-From toilet/BSC: Supervision (verbal cues/safety issues)  FIM - BEngineer, siteAssistive Devices: Bed rails Bed/Chair Transfer: 5: Sit > Supine: Supervision (verbal cues/safety issues);5: Chair or W/C > Bed: Supervision (verbal cues/safety issues) (as of 01/18/14)  FIM - Locomotion: Wheelchair Distance: as of 01/28/14 Locomotion: Wheelchair: 1: Total Assistance/staff pushes wheelchair (Pt<25%) FIM - Locomotion: Ambulation Locomotion: Ambulation Assistive Devices: WAdministratorAmbulation/Gait Assistance: 5: Supervision Locomotion: Ambulation: 2: Travels 50 - 149 ft with supervision/safety issues (as of 01/27/13)  Comprehension Comprehension Mode: Auditory Comprehension: 3-Understands basic 50 - 74% of the time/requires cueing 25 - 50%  of the time  Expression Expression Mode: Verbal Expression: 5-Expresses basic 90% of the time/requires cueing < 10% of the time.  Social Interaction Social Interaction: 3-Interacts appropriately 50 - 74% of the time - May be physically or verbally inappropriate.  Problem Solving Problem Solving: 3-Solves basic 50 - 74% of the time/requires cueing 25 - 49% of the time  Memory Memory: 3-Recognizes or recalls 50 - 74% of the time/requires cueing 25 - 49% of the time  Medical Problem List and Plan:  1. Small acute lacunar infarct/sepsis/metabolic encephalopathy severe cognitive deficits  2. DVT Prophylaxis/Anticoagulation: SCDs. Venous Dopplers negative  3. Pain Management: Tylenol as needed.  4. Mood/anxiety. Anxiety out of control. Increase xanax to 0.514mtid  -Neuropsych documented severe cog def, discussed with pt and son who are minimizing defs  -provided egosupport as well. Family needs to be positive as well.  -nausea SSRI will D/C cont home xanax 5. Neuropsych: This patient is not capable of making decisions on her own  behalf.  6. ID. Methicillin resistant coagulase-negative staph bacteremia. Continue IV vancomycin x6 weeks total 7. Dysphagia. Dysphagia 3 thin liquids. Followup speech therapy  8.Hyponatremia. Resolved , hypoK on supplement 9. Hypertension. Lopressor 25 mg twice a day.improved on inderal, Takes LA at home  -anxiety not helping matters 10. Hypothyroidism. Synthroid. Latest TSH is 0.187   LOS (Days) 10 A FACE TO FACE EVALUATION WAS PERFORMED  Dushawn Pusey E 01/29/2014, 9:11 AM

## 2014-01-29 NOTE — Progress Notes (Signed)
Patient discharged with all belongings with daughter Eber JonesCarolyn and patient's son. Discharge information given, prescriptions, and all questions answered. Nurse tech along with family assisted patient to car.

## 2014-02-10 ENCOUNTER — Telehealth: Payer: Self-pay | Admitting: *Deleted

## 2014-02-10 NOTE — Telephone Encounter (Signed)
[facility]bell [facility]

## 2014-02-11 DIAGNOSIS — I330 Acute and subacute infective endocarditis: Secondary | ICD-10-CM | POA: Diagnosis not present

## 2014-02-11 DIAGNOSIS — A4902 Methicillin resistant Staphylococcus aureus infection, unspecified site: Secondary | ICD-10-CM | POA: Diagnosis not present

## 2014-02-11 DIAGNOSIS — I69959 Hemiplegia and hemiparesis following unspecified cerebrovascular disease affecting unspecified side: Secondary | ICD-10-CM | POA: Diagnosis not present

## 2014-02-11 DIAGNOSIS — I69991 Dysphagia following unspecified cerebrovascular disease: Secondary | ICD-10-CM | POA: Diagnosis not present

## 2014-03-03 ENCOUNTER — Telehealth: Payer: Self-pay | Admitting: Neurology

## 2014-03-03 NOTE — Telephone Encounter (Signed)
Called pt and spoke with pt's son [name] to make an appt on 03/18/14 with Dr. Pearlean BrownieSethi. I advised the pt's son that if the pt has any other problems, questions or concerns to call the office. Pt's son verbalized understanding.

## 2014-03-03 NOTE — Telephone Encounter (Signed)
Pt's son  [name] called states pt saw Dr. Pearlean BrownieSethi in the hospital stroke 01/11/14 per Dr. Pearlean BrownieSethi pt needed a 1 mth f/u please call [name] or sister Harvie BridgeMae to set up an apt for pt due to I don't have anything until July. Thanks

## 2014-03-09 ENCOUNTER — Encounter: Payer: PRIVATE HEALTH INSURANCE | Attending: Physical Medicine & Rehabilitation

## 2014-03-09 ENCOUNTER — Ambulatory Visit (HOSPITAL_BASED_OUTPATIENT_CLINIC_OR_DEPARTMENT_OTHER): Payer: PRIVATE HEALTH INSURANCE | Admitting: Physical Medicine & Rehabilitation

## 2014-03-09 ENCOUNTER — Encounter: Payer: Self-pay | Admitting: Physical Medicine & Rehabilitation

## 2014-03-09 VITALS — BP 138/43 | HR 56 | Resp 14 | Ht 62.0 in | Wt 188.0 lb

## 2014-03-09 DIAGNOSIS — F411 Generalized anxiety disorder: Secondary | ICD-10-CM | POA: Insufficient documentation

## 2014-03-09 DIAGNOSIS — I10 Essential (primary) hypertension: Secondary | ICD-10-CM | POA: Insufficient documentation

## 2014-03-09 DIAGNOSIS — K219 Gastro-esophageal reflux disease without esophagitis: Secondary | ICD-10-CM | POA: Insufficient documentation

## 2014-03-09 DIAGNOSIS — I6350 Cerebral infarction due to unspecified occlusion or stenosis of unspecified cerebral artery: Secondary | ICD-10-CM

## 2014-03-09 DIAGNOSIS — E785 Hyperlipidemia, unspecified: Secondary | ICD-10-CM | POA: Insufficient documentation

## 2014-03-09 DIAGNOSIS — I639 Cerebral infarction, unspecified: Secondary | ICD-10-CM

## 2014-03-09 DIAGNOSIS — I69959 Hemiplegia and hemiparesis following unspecified cerebrovascular disease affecting unspecified side: Secondary | ICD-10-CM | POA: Insufficient documentation

## 2014-03-09 DIAGNOSIS — G8194 Hemiplegia, unspecified affecting left nondominant side: Secondary | ICD-10-CM

## 2014-03-09 DIAGNOSIS — G8190 Hemiplegia, unspecified affecting unspecified side: Secondary | ICD-10-CM

## 2014-03-09 NOTE — Progress Notes (Signed)
Subjective:    Patient ID: Tammy Fernandez, female    DOB: Feb 01, 1924, 80 y.o.   MRN: [id number]  HPI This 80 year old right-handed female  history of hypertension, independent prior to admission and active.  Admitted on January 11, 2014, with altered mental status, staring to the  right, not moving her left side per family report. Noted sodium level  115, potassium 5.7. MRI of the brain on January 11, 2014, showed no  convincing evidence to suggest acute ischemia. However, there was mild  bright signal within the right temporal cortex. Echocardiogram with  ejection fraction 60% grade 1 diastolic dysfunction without embolus.  EEG was negative for seizure. Carotid Dopplers with no ICA stenosis.  Placed on aspirin for CVA prophylaxis. A repeat MRI of the brain,  January 14, 2014, showed small acute lacunar infarct in the right  parietal lobe, posterior right MCA territory  CIR admission DATE OF ADMISSION: 01/19/2014  DATE OF DISCHARGE: 01/29/2014  Since discharge from hospital has been seen by primary care physician on 02/09/2014 Has received home health nursing as well as physical and occupational therapy services.  Walks to BR indep and in Living rm Uses exercise bike Finished PT/OT, home health No falls Indep with ADLs  Pain Inventory Average Pain 7 Pain Right Now 8 My pain is intermittent, tingling and aching  In the last 24 hours, has pain interfered with the following? General activity 7 Relation with others 7 Enjoyment of life 7 What TIME of day is your pain at its worst? varies Sleep (in general) Good  Pain is worse with: walking, sitting and some activites Pain improves with: rest Relief from Meds: 4  Mobility walk without assistance how many minutes can you walk? 2 ability to climb steps?  yes do you drive?  no use a wheelchair  Function retired I need assistance with the following:  bathing, meal prep, household duties and  shopping  Neuro/Psych bladder control problems weakness trouble walking anxiety  Prior Studies Any changes since last visit?  no  Physicians involved in your care Any changes since last visit?  no   Family History  Problem Relation Age of Onset  . Stroke Father   . Hypertension Father   . Colon cancer Cousin   . Colon cancer      Aunt  . Diabetes      aunt and cousins  . Heart disease      cousins   History   Social History  . Marital Status: Widowed    Spouse Name: N/A    Number of Children: 7  . Years of Education: N/A   Social History Main Topics  . Smoking status: Never Smoker   . Smokeless tobacco: Never Used  . Alcohol Use: No  . Drug Use: No  . Sexual Activity: None   Other Topics Concern  . None   Social History Narrative  . None   Past Surgical History  Procedure Laterality Date  . Cesarean section    . Abdominal hysterectomy    . Tee without cardioversion N/A 01/19/2014    Procedure: TRANSESOPHAGEAL ECHOCARDIOGRAM (TEE);  Surgeon: Laurey Moralealton S McLean, MD;  [facility];  Service: Cardiovascular;  Laterality: N/A;  Kriste Basquedayna Fawn Kirk/ja   Past Medical History  Diagnosis Date  . Hyperlipidemia   . Hypertension   . Anxiety   . GERD (gastroesophageal reflux disease)   . Abnormal heart rhythms   . Fatty liver   . Internal hemorrhoid    BP  138/43  Pulse 56  Resp 14  Ht 5\' 2"  (1.575 m)  Wt 188 lb (85.276 kg)  BMI 34.38 kg/m2  SpO2 94%  Opioid Risk Score:   Fall Risk Score: Moderate Fall Risk (6-13 points) (educated and handout given o n fall preventionin the home) Review of Systems  Constitutional: Positive for unexpected weight change.  Musculoskeletal: Positive for gait problem.  Neurological: Positive for weakness.       Tingling  Psychiatric/Behavioral: The patient is nervous/anxious.   All other systems reviewed and are negative.       Objective:   Physical Exam  Nursing note and vitals reviewed. Constitutional: She is oriented  to person, place, and time. She appears well-developed and well-nourished.  HENT:  Head: Normocephalic and atraumatic.  Eyes: Pupils are equal, round, and reactive to light.  Cardiovascular: Normal rate.   bradycardia  Neurological: She is alert and oriented to person, place, and time.  Psychiatric: She has a normal mood and affect.   Decreased fine motor left hand Motor strength 4/5 in the left deltoid, bicep, tricep, grip 5/5 in the right deltoid, bicep, tricep, grip /5 bilateral hip flexor knee extensor ankle dorsi flexion plantar flexor       Assessment & Plan:  1. Right parietal infarct with left hemiparesis, functionally improving but plateaued from home health standpoint. Recommended outpatient PT and OT, will make referral Return to clinic one month

## 2014-03-09 NOTE — Patient Instructions (Addendum)
Call Dr Nicholos Johnseade for low HR  Therapy will call you for an appointment

## 2014-03-12 ENCOUNTER — Encounter: Payer: Self-pay | Admitting: Internal Medicine

## 2014-03-12 ENCOUNTER — Ambulatory Visit (INDEPENDENT_AMBULATORY_CARE_PROVIDER_SITE_OTHER): Payer: PRIVATE HEALTH INSURANCE | Admitting: Internal Medicine

## 2014-03-12 VITALS — BP 134/56 | HR 74 | Temp 98.0°F | Ht 62.0 in | Wt 181.0 lb

## 2014-03-12 DIAGNOSIS — R7881 Bacteremia: Secondary | ICD-10-CM

## 2014-03-12 NOTE — Progress Notes (Signed)
Subjective:    Patient ID: Tammy Fernandez, female    DOB: [birth year], 80 y.o.   MRN: [id number]  HPI 80yo F who was hospitalized in early February for stroke, acute lacunar infarct, in setting of sepsis. She was found to have MRSE, documented clearance on 2/10. She had TEE which was negative for endocarditis. Unclear etiology of her bacteremia, she was treated for 4 wk with vancomycin. She has been off of antibiotics for 4 wk. Doing well with pt/ot. She still has mild residual left sided weakness to left leg. She denies any fever, chills, nightsweats.  Current Outpatient Prescriptions on File Prior to Visit  Medication Sig Dispense Refill  . ALPRAZolam (XANAX) 0.5 MG tablet Take 1 tablet (0.5 mg total) by mouth 3 (three) times daily.  90 tablet  0  . Ascorbic Acid (VITAMIN C PO) Take 1 tablet by mouth daily.      Marland Kitchen. aspirin 325 MG tablet Take 1 tablet (325 mg total) by mouth daily.      . calcium carbonate (TUMS - DOSED IN MG ELEMENTAL CALCIUM) 500 MG chewable tablet Chew 1 tablet by mouth daily as needed for indigestion or heartburn.      . chlorpheniramine (CHLOR-TRIMETON) 4 MG tablet Take 4 mg by mouth 2 (two) times daily as needed for allergies.      Marland Kitchen. levothyroxine (SYNTHROID, LEVOTHROID) 50 MCG tablet Take 1 tablet (50 mcg total) by mouth daily.  30 tablet  1  . meclizine (ANTIVERT) 12.5 MG tablet Take 1 tablet (12.5 mg total) by mouth 3 (three) times daily as needed for dizziness.  30 tablet  0  . naphazoline (NAPHCON) 0.1 % ophthalmic solution Place 1 drop into both eyes 4 (four) times daily -  with meals and at bedtime.  15 mL  0  . pantoprazole (PROTONIX) 40 MG tablet Take 1 tablet (40 mg total) by mouth daily.  30 tablet  1  . propranolol ER (INDERAL LA) 60 MG 24 hr capsule Take 1 capsule (60 mg total) by mouth daily.  30 capsule  1   No current facility-administered medications on file prior to visit.   Active Ambulatory Problems    Diagnosis Date Noted  . GERD (gastroesophageal  reflux disease) 11/14/2011  . Obesities, morbid 11/14/2011  . Hypertension 01/11/2014  . Hyponatremia 01/11/2014  . Hyperkalemia 01/11/2014  . Hematuria 01/11/2014  . E. coli UTI (urinary tract infection) 01/11/2014  . Acute encephalopathy 01/12/2014  . Left hemiplegia 01/12/2014  . Bacteremia due to Staphylococcus 01/13/2014  . Acute respiratory failure with hypoxia 01/13/2014  . Dysphagia, post-stroke 01/15/2014  . CVA (cerebral infarction) 01/19/2014   Resolved Ambulatory Problems    Diagnosis Date Noted  . Acute ischemic right MCA stroke 01/11/2014  . CVA (cerebral infarction) 01/11/2014  . Gram-positive bacteremia 01/13/2014   Past Medical History  Diagnosis Date  . Hyperlipidemia   . Anxiety   . Abnormal heart rhythms   . Fatty liver   . Internal hemorrhoid    History  Substance Use Topics  . Smoking status: Never Smoker   . Smokeless tobacco: Never Used  . Alcohol Use: No  family history includes Colon cancer in her cousin and another family member; Diabetes in an other family member; Heart disease in an other family member; Hypertension in her father; Stroke in her father.   Review of Systems 10 point ros is negative other than residual weakness from stroke    Objective:   Physical Exam BP 134/56  Pulse 74  Temp(Src) 98 F (36.7 C) (Oral)  Ht 5\' 2"  (1.575 m)  Wt 181 lb (82.101 kg)  BMI 33.10 kg/m2 Physical Exam  Constitutional:  oriented to person, place, and time. appears well-developed and well-nourished. No distress.  HENT:  Mouth/Throat: Oropharynx is clear and moist. No oropharyngeal exudate.  Cardiovascular: Normal rate, regular rhythm and normal heart sounds. Exam reveals no gallop and no friction rub.  No murmur heard.  Pulmonary/Chest: Effort normal and breath sounds normal. No respiratory distress.  has no wheezes.  Lymphadenopathy: no cervical adenopathy.  Neurological: alert and oriented to person, place, and time.  Skin: Skin is warm and  dry. No rash noted. No erythema.  Psychiatric: a normal mood and affect.  behavior is normal.       Assessment & Plan:  Hx of mrse bacteremia, finished 4 wk of vancomycin. Will check cbc with diff and surveillance blood culture to ensure she has cleared infection.  rtc prn

## 2014-03-17 ENCOUNTER — Other Ambulatory Visit: Payer: PRIVATE HEALTH INSURANCE

## 2014-03-18 ENCOUNTER — Other Ambulatory Visit: Payer: PRIVATE HEALTH INSURANCE

## 2014-03-18 ENCOUNTER — Ambulatory Visit (INDEPENDENT_AMBULATORY_CARE_PROVIDER_SITE_OTHER): Payer: PRIVATE HEALTH INSURANCE | Admitting: Neurology

## 2014-03-18 ENCOUNTER — Encounter: Payer: Self-pay | Admitting: Neurology

## 2014-03-18 VITALS — BP 121/59 | HR 59 | Ht 67.0 in | Wt 181.0 lb

## 2014-03-18 DIAGNOSIS — R569 Unspecified convulsions: Secondary | ICD-10-CM

## 2014-03-18 DIAGNOSIS — R7881 Bacteremia: Secondary | ICD-10-CM

## 2014-03-18 LAB — CBC WITH DIFFERENTIAL/PLATELET
BASOS ABS: 0 10*3/uL (ref 0.0–0.1)
BASOS PCT: 0 % (ref 0–1)
Eosinophils Absolute: 0.2 10*3/uL (ref 0.0–0.7)
Eosinophils Relative: 2 % (ref 0–5)
HCT: 33.9 % — ABNORMAL LOW (ref 36.0–46.0)
Hemoglobin: 11.3 g/dL — ABNORMAL LOW (ref 12.0–15.0)
LYMPHS PCT: 40 % (ref 12–46)
Lymphs Abs: 3.4 10*3/uL (ref 0.7–4.0)
MCH: 29.3 pg (ref 26.0–34.0)
MCHC: 33.3 g/dL (ref 30.0–36.0)
MCV: 87.8 fL (ref 78.0–100.0)
Monocytes Absolute: 0.8 10*3/uL (ref 0.1–1.0)
Monocytes Relative: 9 % (ref 3–12)
NEUTROS ABS: 4.2 10*3/uL (ref 1.7–7.7)
NEUTROS PCT: 49 % (ref 43–77)
PLATELETS: 350 10*3/uL (ref 150–400)
RBC: 3.86 MIL/uL — ABNORMAL LOW (ref 3.87–5.11)
RDW: 15.8 % — AB (ref 11.5–15.5)
WBC: 8.5 10*3/uL (ref 4.0–10.5)

## 2014-03-18 LAB — CULTURE, BLOOD (SINGLE)
ORGANISM ID, BACTERIA: NO GROWTH
Organism ID, Bacteria: NO GROWTH

## 2014-03-18 NOTE — Progress Notes (Signed)
[facility] [address] [place]. [place] [id number] [phone]       OFFICE FOLLOW-UP NOTE  Ms. Tammy [name] Date of Birth:  April 01, 1924 Medical Record Number:  [id number]   HPI: [age] year old we'll elderly African American lady seen for first office followup visit for hospital consultation for seizure and stroke on 01/11/14. She presented with acute onset of inability to speak, right gaze and left-sided paralysis. Her and as stroke Center admission was 22. CT head showed no acute abnormalities. MRI scan of the brain showed ill-defined diffusion abnormality in the temporal lobe was felt to be atrial findings. Patient's result of this is gradually resolve over the next one to 2 days. Since her clinical presentation could not be explained by them or abnormality or repeat MR scan was obtained 3 days later which showed a tiny acute right parietal infarct which was felt B2 small to explain the patient's deficits and the previous described right temporal subtle diffusion abnormality was no longer seen. It was felt that the patient's clinical presentation represented a seizure with Todd's paralysis triggered by a small right brain infarct. Hemoglobin A1c was 6.6. Lipid profile significant only for atrial of 111. Urine drug screen was negative. Transthoracic echo showed normal ejection fraction. Carotid Doppler showed no significant extracranial stenosis. TEE showed atrial septal aneurysm with a patent foramen ovale and grade 3 plaques in the aortic arch but no intra-atrial clot. Lower extremity venous Dopplers were negative. EEG showed no definite seizure activity. Patient was found to be bacteremic with Staphylococcus aureus and her sodium count was felt to be low with 115 and these were felt to be the more likely etiology for   seizure and MRI abnormality was felt to be postictal which led to her clinical presentation and the tiny right parietal infarct was felt to be clinically asymptomatic.  She has done well since discharged 11 at all she can walk short distances by herself and prefers to use a walker or wheelchair for long distances. She has no residual neurological deficits and is back to her baseline. She is on aspirin and she has stopped Crestor since she has history of statin intolerance and has tried different statins. She is taking fish oil, red yeast rice and flaxseed oil.  ROS:   14 system review of systems is positive for difficulty, and decreased stamina, memory loss, myalgia and all the systems negative  PMH:  Past Medical History  Diagnosis Date  . Hyperlipidemia   . Hypertension   . Anxiety   . GERD (gastroesophageal reflux disease)   . Abnormal heart rhythms   . Fatty liver   . Internal hemorrhoid     Social History:  History   Social History  . Marital Status: Widowed    Spouse Name: N/A    Number of Children: 7  . Years of Education: N/A   Occupational History  . Not on file.   Social History Main Topics  . Smoking status: Never Smoker   . Smokeless tobacco: Never Used  . Alcohol Use: No  . Drug Use: No  . Sexual Activity: Not on file   Other Topics Concern  . Not on file   Social History Narrative   Patient lives at home with her son          Medications:   Current Outpatient Prescriptions on File Prior to Visit  Medication Sig Dispense Refill  . ALPRAZolam (XANAX) 0.5 MG tablet Take 1 tablet (0.5 mg  total) by mouth 3 (three) times daily.  90 tablet  0  . Ascorbic Acid (VITAMIN C PO) Take 1 tablet by mouth daily.      Marland Kitchen aspirin 325 MG tablet Take 1 tablet (325 mg total) by mouth daily.      . calcium carbonate (TUMS - DOSED IN MG ELEMENTAL CALCIUM) 500 MG chewable tablet Chew 1 tablet by mouth daily as needed for indigestion or heartburn.      . chlorpheniramine (CHLOR-TRIMETON) 4 MG tablet Take 4 mg by mouth 2 (two) times daily as needed for allergies.      Marland Kitchen levothyroxine (SYNTHROID, LEVOTHROID) 50 MCG tablet Take 1 tablet (50 mcg  total) by mouth daily.  30 tablet  1  . meclizine (ANTIVERT) 12.5 MG tablet Take 1 tablet (12.5 mg total) by mouth 3 (three) times daily as needed for dizziness.  30 tablet  0  . naphazoline (NAPHCON) 0.1 % ophthalmic solution Place 1 drop into both eyes 4 (four) times daily -  with meals and at bedtime.  15 mL  0  . pantoprazole (PROTONIX) 40 MG tablet Take 1 tablet (40 mg total) by mouth daily.  30 tablet  1  . propranolol ER (INDERAL LA) 60 MG 24 hr capsule Take 1 capsule (60 mg total) by mouth daily.  30 capsule  1   No current facility-administered medications on file prior to visit.    Allergies:   Allergies  Allergen Reactions  . Apple Flavor Hives    No apple sauce  . Peanut Butter Flavor Hives  . Crestor [Rosuvastatin Calcium]   . Sulfa Drugs Cross Reactors Rash    Physical Exam General: obese elderly african american lady, seated, in no evident distress Head: head normocephalic and atraumatic. Oroparynx benign Neck: supple with no carotid or supraclavicular bruits Cardiovascular: regular rate and rhythm, no murmurs Musculoskeletal: no deformity Skin:  no rash/petichiae Vascular:  Normal pulses all extremities Filed Vitals:   03/18/14 1559  BP: 121/59  Pulse: 59   Neurologic Exam Mental Status: Awake and fully alert. Oriented to place and time. Recent and remote memory intact. Attention span, concentration and fund of knowledge appropriate. Mood and affect appropriate.  Cranial Nerves: Fundoscopic exam reveals sharp disc margins. Pupils equal, briskly reactive to light. Extraocular movements full without nystagmus. Visual fields full to confrontation. Hearing intact. Facial sensation intact. Face, tongue, palate moves normally and symmetrically.  Motor: Normal bulk and tone. Normal strength in all tested extremity muscles. Sensory.: intact to touch and pinprick and vibratory sensation.  Coordination: Rapid alternating movements normal in all extremities. Finger-to-nose  and heel-to-shin performed accurately bilaterally. Gait and Station: Arises from chair with mild difficulty. Stance is broad based. Gait demonstrates normal stride length and  Slight imbalance .  Reflexes: 1+ and symmetric except ankle jerks depressed bilaterallyc. Toes downgoing.       ASSESSMENT: 89 year African American lady with single episode of focal right temporal seizure with prolonged postictal state as well   tiny right pareital infarct in February 2015 from which she has made excellent recovery. The infarct may have triggered a seizure with prolonged postictal phase which led to her neurological presentation. Multiple vascular risk factors of obesity hypertension borderline diabetes and hyperlipidemia    PLAN: I had a long discussion the patient and grandson regarding her single seizure and silent tiny pareital infarct and discussed secondary stroke prevention strategies. She does not need anticonvulsants given the fact that she had a single symptomatic seizure in  the setting of sepsis and low sodium. Continue aspirin for stroke prevention and maintain strict control of lipids with LDL cholesterol goal below 100 mg percent. She has a history of statin intolerance. Return for followup in 4 months with Heide GuileLynn Lam, NP or call earlier if necessary     Note: This document was prepared with digital dictation and possible smart phrase technology. Any transcriptional errors that result from this process are unintentional

## 2014-03-18 NOTE — Patient Instructions (Addendum)
I had a long discussion the patient and grandson regarding her single seizure and silent tiny pareital infarct and discussed secondary stroke prevention strategies. She does not need anticonvulsants given the fact that she had a single symptomatic seizure in the setting of sepsis and low sodium. Continue aspirin for stroke prevention and maintain strict control of lipids with LDL cholesterol goal below 100 mg percent. She has a history of statin intolerance. Return for followup in 4 months with Heide GuileLynn Lam, NP or call earlier if necessary  Seizure, Adult A seizure is abnormal electrical activity in the brain. Seizures usually last from 30 seconds to 2 minutes. There are various types of seizures. Before a seizure, you may have a warning sensation (aura) that a seizure is about to occur. An aura may include the following symptoms:   Fear or anxiety.  Nausea.  Feeling like the room is spinning (vertigo).  Vision changes, such as seeing flashing lights or spots. Common symptoms during a seizure include:  A change in attention or behavior (altered mental status).  Convulsions with rhythmic jerking movements.  Drooling.  Rapid eye movements.  Grunting.  Loss of bladder and bowel control.  Bitter taste in the mouth.  Tongue biting. After a seizure, you may feel confused and sleepy. You may also have an injury resulting from convulsions during the seizure. HOME CARE INSTRUCTIONS   If you are given medicines, take them exactly as prescribed by your health care provider.  Keep all follow-up appointments as directed by your health care provider.  Do not swim or drive or engage in risky activity during which a seizure could cause further injury to you or others until your health care provider says it is OK.  Get adequate rest.  Teach friends and family what to do if you have a seizure. They should:  Lay you on the ground to prevent a fall.  Put a cushion under your head.  Loosen any  tight clothing around your neck.  Turn you on your side. If vomiting occurs, this helps keep your airway clear.  Stay with you until you recover.  Know whether or not you need emergency care. SEEK IMMEDIATE MEDICAL CARE IF:  The seizure lasts longer than 5 minutes.  The seizure is severe or you do not wake up immediately after the seizure.  You have an altered mental status after the seizure.  You are having more frequent or worsening seizures. Someone should drive you to the emergency department or call local emergency services (911 in [place] MAKE SURE YOU:  Understand these instructions.  Will watch your condition.  Will get help right away if you are not doing well or get worse. Document Released: 11/16/2000 Document Revised: 09/09/2013 Document Reviewed: 07/01/2013 [facility] Patient Information 2014 [employer] [place].   Stroke Prevention Some medical conditions and behaviors are associated with an increased chance of having a stroke. You may prevent a stroke by making healthy choices and managing medical conditions. HOW CAN I REDUCE MY RISK OF HAVING A STROKE?   Stay physically active. Get at least 30 minutes of activity on most or all days.  Do not smoke. It may also be helpful to avoid exposure to secondhand smoke.  Limit alcohol use. Moderate alcohol use is considered to be:  No more than 2 drinks per day for men.  No more than 1 drink per day for nonpregnant women.  Eat healthy foods. This involves  Eating 5 or more servings of fruits and vegetables a day.  Following a diet that addresses high blood pressure (hypertension), high cholesterol, diabetes, or obesity.  Manage your cholesterol levels.  A diet low in saturated fat, trans fat, and cholesterol and high in fiber may control cholesterol levels.  Take any prescribed medicines to control cholesterol as directed by your health care provider.  Manage your diabetes.  A controlled-carbohydrate,  controlled-sugar diet is recommended to manage diabetes.  Take any prescribed medicines to control diabetes as directed by your health care provider.  Control your hypertension.  A low-salt (sodium), low-saturated fat, low-trans fat, and low-cholesterol diet is recommended to manage hypertension.  Take any prescribed medicines to control hypertension as directed by your health care provider.  Maintain a healthy weight.  A reduced-calorie, low-sodium, low-saturated fat, low-trans fat, low-cholesterol diet is recommended to manage weight.  Stop drug abuse.  Avoid taking birth control pills.  Talk to your health care provider about the risks of taking birth control pills if you are over 78 years old, smoke, get migraines, or have ever had a blood clot.  Get evaluated for sleep disorders (sleep apnea).  Talk to your health care provider about getting a sleep evaluation if you snore a lot or have excessive sleepiness.  Take medicines as directed by your health care provider.  For some people, aspirin or blood thinners (anticoagulants) are helpful in reducing the risk of forming abnormal blood clots that can lead to stroke. If you have the irregular heart rhythm of atrial fibrillation, you should be on a blood thinner unless there is a good reason you cannot take them.  Understand all your medicine instructions.  Make sure that other other conditions (such as anemia or atherosclerosis) are addressed. SEEK IMMEDIATE MEDICAL CARE IF:   You have sudden weakness or numbness of the face, arm, or leg, especially on one side of the body.  Your face or eyelid droops to one side.  You have sudden confusion.  You have trouble speaking (aphasia) or understanding.  You have sudden trouble seeing in one or both eyes.  You have sudden trouble walking.  You have dizziness.  You have a loss of balance or coordination.  You have a sudden, severe headache with no known cause.  You have new  chest pain or an irregular heartbeat. Any of these symptoms may represent a serious problem that is an emergency. Do not wait to see if the symptoms will go away. Get medical help at once. Call your local emergency services  (911 in [place] Do not drive yourself to the hospital. Document Released: 12/27/2004 Document Revised: 09/09/2013 Document Reviewed: 05/22/2013 [facility] Patient Information 2014 [employer], [place].

## 2014-04-06 ENCOUNTER — Ambulatory Visit: Payer: PRIVATE HEALTH INSURANCE | Attending: Physical Medicine & Rehabilitation | Admitting: Physical Therapy

## 2014-04-13 ENCOUNTER — Ambulatory Visit: Payer: PRIVATE HEALTH INSURANCE | Admitting: Physical Medicine & Rehabilitation

## 2014-04-13 ENCOUNTER — Encounter: Payer: PRIVATE HEALTH INSURANCE | Attending: Physical Medicine & Rehabilitation

## 2014-04-13 DIAGNOSIS — F411 Generalized anxiety disorder: Secondary | ICD-10-CM | POA: Insufficient documentation

## 2014-04-13 DIAGNOSIS — K219 Gastro-esophageal reflux disease without esophagitis: Secondary | ICD-10-CM | POA: Insufficient documentation

## 2014-04-13 DIAGNOSIS — E785 Hyperlipidemia, unspecified: Secondary | ICD-10-CM | POA: Insufficient documentation

## 2014-04-13 DIAGNOSIS — I10 Essential (primary) hypertension: Secondary | ICD-10-CM | POA: Insufficient documentation

## 2014-04-13 DIAGNOSIS — I69959 Hemiplegia and hemiparesis following unspecified cerebrovascular disease affecting unspecified side: Secondary | ICD-10-CM | POA: Insufficient documentation

## 2014-05-24 ENCOUNTER — Ambulatory Visit: Payer: PRIVATE HEALTH INSURANCE | Admitting: Physical Therapy

## 2014-06-01 ENCOUNTER — Other Ambulatory Visit: Payer: Self-pay

## 2014-06-01 DIAGNOSIS — Z1231 Encounter for screening mammogram for malignant neoplasm of breast: Secondary | ICD-10-CM

## 2014-06-07 ENCOUNTER — Ambulatory Visit: Payer: PRIVATE HEALTH INSURANCE | Attending: Physical Medicine & Rehabilitation | Admitting: Physical Therapy

## 2014-06-16 ENCOUNTER — Ambulatory Visit: Payer: PRIVATE HEALTH INSURANCE

## 2014-06-25 ENCOUNTER — Telehealth: Payer: Self-pay | Admitting: Nurse Practitioner

## 2014-06-25 ENCOUNTER — Ambulatory Visit: Payer: Self-pay | Admitting: Nurse Practitioner

## 2014-06-25 NOTE — Telephone Encounter (Signed)
Patient was no show for today's office appointment.  

## 2014-07-01 ENCOUNTER — Ambulatory Visit: Payer: PRIVATE HEALTH INSURANCE

## 2014-08-17 ENCOUNTER — Ambulatory Visit: Payer: PRIVATE HEALTH INSURANCE

## 2014-08-18 ENCOUNTER — Ambulatory Visit: Payer: PRIVATE HEALTH INSURANCE

## 2014-08-25 ENCOUNTER — Ambulatory Visit: Payer: PRIVATE HEALTH INSURANCE | Attending: Physical Medicine & Rehabilitation | Admitting: Physical Therapy

## 2014-08-27 ENCOUNTER — Ambulatory Visit: Payer: PRIVATE HEALTH INSURANCE

## 2014-09-03 ENCOUNTER — Ambulatory Visit: Payer: PRIVATE HEALTH INSURANCE

## 2014-09-09 ENCOUNTER — Ambulatory Visit: Payer: PRIVATE HEALTH INSURANCE

## 2014-09-10 ENCOUNTER — Ambulatory Visit
Admission: RE | Admit: 2014-09-10 | Discharge: 2014-09-10 | Disposition: A | Discharge status: Home / Self Care | Payer: PRIVATE HEALTH INSURANCE | Source: Ambulatory Visit

## 2014-09-10 DIAGNOSIS — Z1231 Encounter for screening mammogram for malignant neoplasm of breast: Secondary | ICD-10-CM

## 2014-11-01 ENCOUNTER — Ambulatory Visit: Payer: PRIVATE HEALTH INSURANCE

## 2014-12-03 DIAGNOSIS — J189 Pneumonia, unspecified organism: Secondary | ICD-10-CM

## 2014-12-03 HISTORY — DX: Pneumonia, unspecified organism: J18.9

## 2015-01-18 ENCOUNTER — Ambulatory Visit: Payer: Medicaid Other | Admitting: Physical Therapy

## 2015-01-24 ENCOUNTER — Ambulatory Visit: Payer: Medicaid Other | Admitting: Physical Therapy

## 2015-02-17 ENCOUNTER — Other Ambulatory Visit: Payer: Self-pay | Admitting: *Deleted

## 2015-06-01 ENCOUNTER — Ambulatory Visit: Payer: Medicaid Other | Admitting: Rehabilitative and Restorative Service Providers"

## 2015-06-22 ENCOUNTER — Ambulatory Visit: Payer: Medicaid Other | Admitting: Rehabilitative and Restorative Service Providers"

## 2015-07-04 ENCOUNTER — Encounter: Payer: Self-pay | Admitting: Physical Therapy

## 2015-07-04 ENCOUNTER — Ambulatory Visit: Payer: Medicare Other | Attending: Family Medicine | Admitting: Physical Therapy

## 2015-07-04 DIAGNOSIS — I69354 Hemiplegia and hemiparesis following cerebral infarction affecting left non-dominant side: Secondary | ICD-10-CM

## 2015-07-04 DIAGNOSIS — R269 Unspecified abnormalities of gait and mobility: Secondary | ICD-10-CM | POA: Insufficient documentation

## 2015-07-04 DIAGNOSIS — I69854 Hemiplegia and hemiparesis following other cerebrovascular disease affecting left non-dominant side: Secondary | ICD-10-CM | POA: Insufficient documentation

## 2015-07-05 ENCOUNTER — Encounter: Payer: Self-pay | Admitting: Physical Therapy

## 2015-07-05 NOTE — Therapy (Signed)
[facility] [address] [place], [place], [address] Phone: [phone]   Fax:  [phone]  Physical Therapy Evaluation  Patient Details  Name: Tammy Fernandez MRN: [id number] Date of Birth: 25-Nov-1924 Referring Provider:  Elias Else, MD  Encounter Date: 07/04/2015      PT End of Session - 07/05/15 2139    Visit Number 1  G1   Number of Visits 17   Date for PT Re-Evaluation 09/02/15   Authorization Type U[employer] Medicare   Authorization Time Period 07-04-15 - 09-02-15   PT Start Time 1450   PT Stop Time 1530   PT Time Calculation (min) 40 min   Equipment Utilized During Treatment Gait belt      Past Medical History  Diagnosis Date  . Hyperlipidemia   . Hypertension   . Anxiety   . GERD (gastroesophageal reflux disease)   . Abnormal heart rhythms   . Fatty liver   . Internal hemorrhoid     Past Surgical History  Procedure Laterality Date  . Cesarean section    . Abdominal hysterectomy    . Tee without cardioversion N/A 01/19/2014    Procedure: TRANSESOPHAGEAL ECHOCARDIOGRAM (TEE);  Surgeon: Laurey Morale, MD;  Location: [facility] ENDOSCOPY;  Service: Cardiovascular;  Laterality: N/A;  [name]    There were no vitals filed for this visit.  Visit Diagnosis:  Abnormality of gait - Plan: PT plan of care cert/re-cert  Hemiparesis affecting left side as late effect of cerebrovascular accident - Plan: PT plan of care cert/re-cert      Subjective Assessment - 07/04/15 1502    Subjective Pt. presents with L hemiplegia due to R CVA in Feb. 2015; states she stayed sick most of the time following the stroke so she was unable to come to OP for PT; reports having had a fall in kitchen due to wet floor about 3 weeks ago   Patient is accompained by: Family member  son   Pertinent History R CVA in Feb 2015;   Patient Stated Goals improve walking; be able to walk in community without having to use wheelchair   Currently in  Pain? Yes   Pain Score 0-No pain  8/10 intensity at times   Pain Location Back   Pain Orientation Lower;Right;Left   Pain Type Chronic pain   Pain Onset More than a month ago   Pain Frequency Intermittent   Aggravating Factors  none   Pain Relieving Factors Tylenol and heat            [facility] PT Assessment - 07/05/15 0001    Assessment   Medical Diagnosis R CVA with L hemiparesis    Onset Date/Surgical Date --  Feb. 2015   Balance Screen   Has the patient fallen in the past 6 months Yes   How many times? 2   Has the patient had a decrease in activity level because of a fear of falling?  No   Is the patient reluctant to leave their home because of a fear of falling?  No   Home Environment   Living Environment Private residence   Type of Home House   Home Access Stairs to enter   Entrance Stairs-Number of Steps 3   Entrance Stairs-Rails Left   Prior Function   Level of Independence Independent with household mobility without device   ROM / Strength   AROM / PROM / Strength AROM   AROM   Overall AROM  Within  functional limits for tasks performed   Transfers   Transfers Sit to Stand   Sit to Stand 6: Modified independent (Device/Increase time)   Ambulation/Gait   Ambulation/Gait Yes   Ambulation/Gait Assistance 4: Min assist   Ambulation/Gait Assistance Details pt. reported that she felt that LLE was going to give away after approx. 40' amb.   Ambulation Distance (Feet) 60 Feet   Assistive device --  none   Gait Pattern Step-to pattern;Decreased stride length;Decreased step length - right;Decreased step length - left;Trunk flexed;Decreased hip/knee flexion - left   Gait velocity .89  36.93 secs   Timed Up and Go Test   Normal TUG (seconds) 38.72  with no device                           PT Education - 07/05/15 2138    Education provided Yes   Education Details Recommended need for use of RW for assist. with amb. to reduce fall risk   Person(s)  Educated Patient;Child(ren)   Methods Explanation   Comprehension Verbalized understanding          PT Short Term Goals - 07/05/15 2147    PT SHORT TERM GOAL #1   Title Amb. 100' with RW with CGA on flat, even surface.  (08-04-15)   Baseline 60' total distance with 1 seated rest period   Time 4   Period Weeks   Status New   PT SHORT TERM GOAL #2   Title Stand for 3" with UE support prn with CGA for incr. independence with ADL's  (08-04-15)   Time 4   Period Weeks   Status New   PT SHORT TERM GOAL #3   Title Sit to/from stand to RW with correct hand placement with S  (08-04-15)   Time 4   Period Weeks   Status New   PT SHORT TERM GOAL #4   Title Independent in HEP for strengthening and balance exercises.           PT Long Term Goals - 07/05/15 2151    PT LONG TERM GOAL #1   Title Modified independent with household amb. with RW  (09-02-15)   Time 8   Period Weeks   Status New   PT LONG TERM GOAL #2   Title Amb. 47' with RW with CGA for incr. commuity accessibility without having to use wheelchair.  (09-02-15)   Time 8   Period Weeks   Status New   PT LONG TERM GOAL #3   Title Report ability to stand for at least 5" with UE support prn for incr. independence with ADL's  (09-02-15)   Time 8   Period Weeks   Status New   PT LONG TERM GOAL #4   Title Increase FOTO score by at least 10 points for incr. pt satisfaction  (09-02-15)   Baseline to be completed as not done at initial evaluation   Time 8   Period Weeks   Status New               Plan - 07/05/15 2142    Clinical Impression Statement Pt. presents with gait deviations with decr. activity tolerance/endurance; pt needs RW for assistance wth gait to reduce fall risk. Pt initially declined use of device but states she may agree to use short-term   Pt will benefit from skilled therapeutic intervention in order to improve on the following deficits Abnormal gait;Decreased endurance;Decreased activity  tolerance;Decreased  balance;Decreased mobility;Decreased strength;Decreased knowledge of use of DME;Decreased coordination;Decreased range of motion;Postural dysfunction;Difficulty walking   Rehab Potential Good   PT Frequency 2x / week   PT Duration 8 weeks   PT Treatment/Interventions ADLs/Self Care Home Management;Canalith Repostioning;DME Instruction;Gait training;Stair training;Functional mobility training;Patient/family education;Neuromuscular re-education;Balance training;Therapeutic exercise;Therapeutic activities   PT Next Visit Plan gait train with RW; assess standing time - static; request script for RW from MD   PT Home Exercise Plan funcitonal exercises - sit to stand and LE strengthening   Consulted and Agree with Plan of Care Patient;Family member/caregiver   Family Member Consulted son          G-Codes - 07/10/2015 17-Apr-2155    Functional Assessment Tool Used TUG score 38.72 secs; gait velocity .89 ft/sec; pt. declining use of assist. device at evaluation - gait is unsafe resulting in fall risk   Functional Limitation Mobility: Walking and moving around   Mobility: Walking and Moving Around Current Status [phone]) At least 60 percent but less than 80 percent impaired, limited or restricted   Mobility: Walking and Moving Around Goal Status [phone]) At least 40 percent but less than 60 percent impaired, limited or restricted       Problem List Patient Active Problem List   Diagnosis Date Noted  . CVA (cerebral infarction) 01/19/2014  . Dysphagia, post-stroke 01/15/2014  . Bacteremia due to Staphylococcus 01/13/2014  . Acute respiratory failure with hypoxia 01/13/2014  . Acute encephalopathy 01/12/2014  . Left hemiplegia 01/12/2014  . Hypertension 01/11/2014  . Hyponatremia 01/11/2014  . Hyperkalemia 01/11/2014  . Hematuria 01/11/2014  . E. coli UTI (urinary tract infection) 01/11/2014  . GERD (gastroesophageal reflux disease) 11/14/2011  . Obesities, morbid 11/14/2011     Kary Kos, PT 07/05/2015, 10:00 PM  [facility] [address] [place], [place], [address] Phone: [phone]   Fax:  [phone]

## 2015-07-19 ENCOUNTER — Ambulatory Visit: Payer: Medicare Other | Admitting: Physical Therapy

## 2015-07-22 ENCOUNTER — Ambulatory Visit: Payer: Medicare Other | Admitting: Physical Therapy

## 2015-07-22 DIAGNOSIS — I69354 Hemiplegia and hemiparesis following cerebral infarction affecting left non-dominant side: Secondary | ICD-10-CM

## 2015-07-22 DIAGNOSIS — R269 Unspecified abnormalities of gait and mobility: Secondary | ICD-10-CM | POA: Diagnosis not present

## 2015-07-24 ENCOUNTER — Encounter: Payer: Self-pay | Admitting: Physical Therapy

## 2015-07-24 NOTE — Therapy (Signed)
[facility] [address] [place], [place], [address] Phone: [phone]   Fax:  [phone]  Physical Therapy Treatment  Patient Details  Name: Tammy Fernandez MRN: [id number] Date of Birth: 02-20-1924 Referring Provider:  Elias Else, MD  Encounter Date: 07/22/2015      PT End of Session - 07/24/15 2012    Visit Number 2   Number of Visits 17   Date for PT Re-Evaluation 09/02/15   Authorization Type UHC Medicare   Authorization Time Period 07-04-15 - 09-02-15   PT Start Time 1155   PT Stop Time 1240   PT Time Calculation (min) 45 min   Equipment Utilized During Treatment Gait belt      Past Medical History  Diagnosis Date  . Hyperlipidemia   . Hypertension   . Anxiety   . GERD (gastroesophageal reflux disease)   . Abnormal heart rhythms   . Fatty liver   . Internal hemorrhoid     Past Surgical History  Procedure Laterality Date  . Cesarean section    . Abdominal hysterectomy    . Tee without cardioversion N/A 01/19/2014    Procedure: TRANSESOPHAGEAL ECHOCARDIOGRAM (TEE);  Surgeon: Laurey Morale, MD;  Location: [facility] ENDOSCOPY;  Service: Cardiovascular;  Laterality: N/A;  [name]    There were no vitals filed for this visit.  Visit Diagnosis:  Abnormality of gait  Hemiparesis affecting left side as late effect of cerebrovascular accident      Subjective Assessment - 07/24/15 2009    Subjective Pt. reports not using walker in the home but able to only amb. very few steps "I don't want to get dependent on it"   Patient is accompained by: Family member   Pertinent History R CVA in Feb 2015;   Patient Stated Goals improve walking; be able to walk in community without having to use wheelchair   Currently in Pain? No/denies                         [facility] Adult PT Treatment/Exercise - 07/24/15 0001    Ambulation/Gait   Ambulation/Gait Yes   Ambulation/Gait Assistance 4: Min guard   Ambulation/Gait Assistance Details cues for step length and posture   Ambulation Distance (Feet) 60 Feet  120' 2nd rep with RW   Assistive device Rolling walker   Gait Pattern Step-to pattern;Decreased stride length;Decreased step length - right;Decreased step length - left;Trunk flexed;Decreased hip/knee flexion - left   Ambulation Surface Level;Indoor     TherEx;  Bridging x 10 reps; L hip abduction in sidelying x 10 reps; L SLR x 10 reps Sit to stand with UE support x 5 reps with CGA SciFit level 1.5 x 5" with UE's and LE's           PT Education - 07/24/15 2012    Education provided Yes   Education Details recommended pt use RW for safety with amb. and to incr. amb. distance and endurance   Person(s) Educated Patient;Child(ren)   Methods Explanation;Demonstration   Comprehension Verbalized understanding          PT Short Term Goals - 07/05/15 2147    PT SHORT TERM GOAL #1   Title Amb. 100' with RW with CGA on flat, even surface.  (08-04-15)   Baseline 60' total distance with 1 seated rest period   Time 4   Period Weeks   Status New   PT SHORT TERM GOAL #2  Title Stand for 3" with UE support prn with CGA for incr. independence with ADL's  (08-04-15)   Time 4   Period Weeks   Status New   PT SHORT TERM GOAL #3   Title Sit to/from stand to RW with correct hand placement with S  (08-04-15)   Time 4   Period Weeks   Status New   PT SHORT TERM GOAL #4   Title Independent in HEP for strengthening and balance exercises.           PT Long Term Goals - 07/05/15 2151    PT LONG TERM GOAL #1   Title Modified independent with household amb. with RW  (09-02-15)   Time 8   Period Weeks   Status New   PT LONG TERM GOAL #2   Title Amb. 40' with RW with CGA for incr. commuity accessibility without having to use wheelchair.  (09-02-15)   Time 8   Period Weeks   Status New   PT LONG TERM GOAL #3   Title Report ability to stand for at least 5" with UE support prn for incr.  independence with ADL's  (09-02-15)   Time 8   Period Weeks   Status New   PT LONG TERM GOAL #4   Title Increase FOTO score by at least 10 points for incr. pt satisfaction  (09-02-15)   Baseline to be completed as not done at initial evaluation   Time 8   Period Weeks   Status New               Plan - 07/24/15 2013    Clinical Impression Statement Pt. improved on 2nd rep of gait training with use of RW; needs assist. device for safety due to weakness in lower extremities and decr. balance   Pt will benefit from skilled therapeutic intervention in order to improve on the following deficits Abnormal gait;Decreased endurance;Decreased activity tolerance;Decreased balance;Decreased mobility;Decreased strength;Decreased knowledge of use of DME;Decreased coordination;Decreased range of motion;Postural dysfunction;Difficulty walking   Rehab Potential Good   PT Frequency 2x / week   PT Duration 8 weeks   PT Treatment/Interventions ADLs/Self Care Home Management;Canalith Repostioning;DME Instruction;Gait training;Stair training;Functional mobility training;Patient/family education;Neuromuscular re-education;Balance training;Therapeutic exercise;Therapeutic activities   PT Next Visit Plan assess standing time; begin HEP instruction   PT Home Exercise Plan funcitonal exercises - sit to stand and LE strengthening   Consulted and Agree with Plan of Care Family member/caregiver;Patient        Problem List Patient Active Problem List   Diagnosis Date Noted  . CVA (cerebral infarction) 01/19/2014  . Dysphagia, post-stroke 01/15/2014  . Bacteremia due to Staphylococcus 01/13/2014  . Acute respiratory failure with hypoxia 01/13/2014  . Acute encephalopathy 01/12/2014  . Left hemiplegia 01/12/2014  . Hypertension 01/11/2014  . Hyponatremia 01/11/2014  . Hyperkalemia 01/11/2014  . Hematuria 01/11/2014  . E. coli UTI (urinary tract infection) 01/11/2014  . GERD (gastroesophageal reflux  disease) 11/14/2011  . Obesities, morbid 11/14/2011    Kary Kos, PT 07/24/2015, 8:16 PM  [facility] [address] [place], [place], [address] Phone: [phone]   Fax:  [phone]

## 2015-07-26 ENCOUNTER — Ambulatory Visit: Payer: Medicare Other | Admitting: Physical Therapy

## 2015-07-28 ENCOUNTER — Ambulatory Visit: Payer: Medicare Other | Admitting: Physical Therapy

## 2015-07-28 DIAGNOSIS — R269 Unspecified abnormalities of gait and mobility: Secondary | ICD-10-CM | POA: Diagnosis not present

## 2015-07-28 DIAGNOSIS — I69354 Hemiplegia and hemiparesis following cerebral infarction affecting left non-dominant side: Secondary | ICD-10-CM

## 2015-07-30 ENCOUNTER — Encounter: Payer: Self-pay | Admitting: Physical Therapy

## 2015-07-30 NOTE — Therapy (Signed)
[facility] [address] [place], [place], [address] Phone: [phone]   Fax:  [phone]  Physical Therapy Treatment  Patient Details  Name: Tammy Fernandez MRN: [id number] Date of Birth: November 21, 1924 Referring Provider:  Elias Else, MD  Encounter Date: 07/28/2015      PT End of Session - 07/30/15 1458    Visit Number 3   Number of Visits 17   Date for PT Re-Evaluation 09/02/15   Authorization Type UHC Medicare   Authorization Time Period 07-04-15 - 09-02-15   PT Start Time 1542   PT Stop Time 1616   PT Time Calculation (min) 34 min   Equipment Utilized During Treatment Gait belt      Past Medical History  Diagnosis Date  . Hyperlipidemia   . Hypertension   . Anxiety   . GERD (gastroesophageal reflux disease)   . Abnormal heart rhythms   . Fatty liver   . Internal hemorrhoid     Past Surgical History  Procedure Laterality Date  . Cesarean section    . Abdominal hysterectomy    . Tee without cardioversion N/A 01/19/2014    Procedure: TRANSESOPHAGEAL ECHOCARDIOGRAM (TEE);  Surgeon: Laurey Morale, MD;  Location: [facility] ENDOSCOPY;  Service: Cardiovascular;  Laterality: N/A;  [name]    There were no vitals filed for this visit.  Visit Diagnosis:  Abnormality of gait  Hemiparesis affecting left side as late effect of cerebrovascular accident      Subjective Assessment - 07/30/15 1455    Subjective Pt. states she is not feeling well today - eyes are watering due to allergies and acid reflux is a problem today   Patient is accompained by: Family member   Pertinent History R CVA in Feb 2015;   Patient Stated Goals improve walking; be able to walk in community without having to use wheelchair   Currently in Pain? No/denies                         [facility] Adult PT Treatment/Exercise - 07/30/15 0001    Ambulation/Gait   Ambulation/Gait Yes   Ambulation/Gait Assistance 4: Min guard   Ambulation/Gait Assistance Details cues for posture and step length   Ambulation Distance (Feet) 90 Feet   Assistive device Rolling walker   Gait Pattern Step-to pattern;Decreased stride length;Decreased step length - right;Decreased step length - left;Trunk flexed;Decreased hip/knee flexion - left   Ambulation Surface Level;Indoor   Exercises   Exercises Knee/Hip   Knee/Hip Exercises: Standing   Heel Raises Both;10 reps   Hip Flexion AROM;Stengthening;Both;1 set;10 reps  2# weight on each leg   Hip Abduction Stengthening;Both;1 set;10 reps  2# weight on R and LLE   Hip Extension AROM;Stengthening;Both;1 set;10 reps  with 2# weight                  PT Short Term Goals - 07/05/15 2147    PT SHORT TERM GOAL #1   Title Amb. 100' with RW with CGA on flat, even surface.  (08-04-15)   Baseline 60' total distance with 1 seated rest period   Time 4   Period Weeks   Status New   PT SHORT TERM GOAL #2   Title Stand for 3" with UE support prn with CGA for incr. independence with ADL's  (08-04-15)   Time 4   Period Weeks   Status New   PT SHORT TERM GOAL #3   Title Sit  to/from stand to RW with correct hand placement with S  (08-04-15)   Time 4   Period Weeks   Status New   PT SHORT TERM GOAL #4   Title Independent in HEP for strengthening and balance exercises.           PT Long Term Goals - 07/05/15 2151    PT LONG TERM GOAL #1   Title Modified independent with household amb. with RW  (09-02-15)   Time 8   Period Weeks   Status New   PT LONG TERM GOAL #2   Title Amb. 70' with RW with CGA for incr. commuity accessibility without having to use wheelchair.  (09-02-15)   Time 8   Period Weeks   Status New   PT LONG TERM GOAL #3   Title Report ability to stand for at least 5" with UE support prn for incr. independence with ADL's  (09-02-15)   Time 8   Period Weeks   Status New   PT LONG TERM GOAL #4   Title Increase FOTO score by at least 10 points for incr. pt  satisfaction  (09-02-15)   Baseline to be completed as not done at initial evaluation   Time 8   Period Weeks   Status New               Plan - 07/30/15 1459    Clinical Impression Statement Pt. requested to decr. activities in PT today due to not feeling well; activity tolerance decreased - pt. limited by eyes watering due to allergies and requested not to lie down due to acid reflux   Pt will benefit from skilled therapeutic intervention in order to improve on the following deficits Abnormal gait;Decreased endurance;Decreased activity tolerance;Decreased balance;Decreased mobility;Decreased strength;Decreased knowledge of use of DME;Decreased coordination;Decreased range of motion;Postural dysfunction;Difficulty walking   Rehab Potential Good   PT Frequency 2x / week   PT Duration 8 weeks   PT Treatment/Interventions ADLs/Self Care Home Management;Canalith Repostioning;DME Instruction;Gait training;Stair training;Functional mobility training;Patient/family education;Neuromuscular re-education;Balance training;Therapeutic exercise;Therapeutic activities   PT Next Visit Plan cont gait and balance and LE strengthening   PT Home Exercise Plan funcitonal exercises - sit to stand and LE strengthening   Consulted and Agree with Plan of Care Family member/caregiver;Patient        Problem List Patient Active Problem List   Diagnosis Date Noted  . CVA (cerebral infarction) 01/19/2014  . Dysphagia, post-stroke 01/15/2014  . Bacteremia due to Staphylococcus 01/13/2014  . Acute respiratory failure with hypoxia 01/13/2014  . Acute encephalopathy 01/12/2014  . Left hemiplegia 01/12/2014  . Hypertension 01/11/2014  . Hyponatremia 01/11/2014  . Hyperkalemia 01/11/2014  . Hematuria 01/11/2014  . E. coli UTI (urinary tract infection) 01/11/2014  . GERD (gastroesophageal reflux disease) 11/14/2011  . Obesities, morbid 11/14/2011    Kary Kos, PT 07/30/2015, 3:02 PM  [facility] [address] [place], [place], [address] Phone: [phone]   Fax:  [phone]

## 2015-08-02 ENCOUNTER — Ambulatory Visit: Payer: Medicare Other | Admitting: Physical Therapy

## 2015-08-09 ENCOUNTER — Ambulatory Visit: Payer: Medicare Other | Admitting: Physical Therapy

## 2015-08-18 ENCOUNTER — Ambulatory Visit: Payer: Medicare Other | Admitting: Physical Therapy

## 2015-08-25 ENCOUNTER — Ambulatory Visit: Payer: Medicare Other | Admitting: Physical Therapy

## 2015-09-20 ENCOUNTER — Ambulatory Visit: Payer: Medicare Other | Admitting: Physical Therapy

## 2015-10-03 ENCOUNTER — Ambulatory Visit: Payer: Medicare Other | Admitting: Physical Therapy

## 2015-10-29 ENCOUNTER — Inpatient Hospital Stay (HOSPITAL_COMMUNITY)
Admission: EM | Admit: 2015-10-29 | Discharge: 2015-11-01 | DRG: 193 | Disposition: A | Discharge status: Home / Self Care | Payer: Medicare Other | Attending: Internal Medicine | Admitting: Internal Medicine

## 2015-10-29 ENCOUNTER — Emergency Department (HOSPITAL_COMMUNITY): Payer: Medicare Other

## 2015-10-29 ENCOUNTER — Encounter (HOSPITAL_COMMUNITY): Payer: Self-pay

## 2015-10-29 DIAGNOSIS — E039 Hypothyroidism, unspecified: Secondary | ICD-10-CM | POA: Diagnosis present

## 2015-10-29 DIAGNOSIS — J189 Pneumonia, unspecified organism: Principal | ICD-10-CM | POA: Diagnosis present

## 2015-10-29 DIAGNOSIS — E785 Hyperlipidemia, unspecified: Secondary | ICD-10-CM | POA: Diagnosis present

## 2015-10-29 DIAGNOSIS — K219 Gastro-esophageal reflux disease without esophagitis: Secondary | ICD-10-CM | POA: Diagnosis present

## 2015-10-29 DIAGNOSIS — E869 Volume depletion, unspecified: Secondary | ICD-10-CM | POA: Diagnosis present

## 2015-10-29 DIAGNOSIS — R739 Hyperglycemia, unspecified: Secondary | ICD-10-CM | POA: Diagnosis present

## 2015-10-29 DIAGNOSIS — K760 Fatty (change of) liver, not elsewhere classified: Secondary | ICD-10-CM | POA: Diagnosis present

## 2015-10-29 DIAGNOSIS — I10 Essential (primary) hypertension: Secondary | ICD-10-CM | POA: Diagnosis not present

## 2015-10-29 DIAGNOSIS — Z8673 Personal history of transient ischemic attack (TIA), and cerebral infarction without residual deficits: Secondary | ICD-10-CM

## 2015-10-29 DIAGNOSIS — F419 Anxiety disorder, unspecified: Secondary | ICD-10-CM | POA: Diagnosis present

## 2015-10-29 DIAGNOSIS — K648 Other hemorrhoids: Secondary | ICD-10-CM | POA: Diagnosis present

## 2015-10-29 DIAGNOSIS — J9601 Acute respiratory failure with hypoxia: Secondary | ICD-10-CM | POA: Diagnosis present

## 2015-10-29 DIAGNOSIS — E871 Hypo-osmolality and hyponatremia: Secondary | ICD-10-CM | POA: Diagnosis present

## 2015-10-29 LAB — CBC WITH DIFFERENTIAL/PLATELET
Basophils Absolute: 0 10*3/uL (ref 0.0–0.1)
Basophils Relative: 0 %
EOS ABS: 0.3 10*3/uL (ref 0.0–0.7)
EOS PCT: 3 %
HCT: 43.4 % (ref 36.0–46.0)
HEMOGLOBIN: 14 g/dL (ref 12.0–15.0)
LYMPHS ABS: 3.1 10*3/uL (ref 0.7–4.0)
Lymphocytes Relative: 29 %
MCH: 27.7 pg (ref 26.0–34.0)
MCHC: 32.3 g/dL (ref 30.0–36.0)
MCV: 85.9 fL (ref 78.0–100.0)
MONO ABS: 0.8 10*3/uL (ref 0.1–1.0)
MONOS PCT: 7 %
NEUTROS PCT: 61 %
Neutro Abs: 6.5 10*3/uL (ref 1.7–7.7)
Platelets: 346 10*3/uL (ref 150–400)
RBC: 5.05 MIL/uL (ref 3.87–5.11)
RDW: 14.4 % (ref 11.5–15.5)
WBC: 10.7 10*3/uL — ABNORMAL HIGH (ref 4.0–10.5)

## 2015-10-29 LAB — BRAIN NATRIURETIC PEPTIDE: B NATRIURETIC PEPTIDE 5: 53.8 pg/mL (ref 0.0–100.0)

## 2015-10-29 LAB — BASIC METABOLIC PANEL
Anion gap: 6 (ref 5–15)
BUN: 8 mg/dL (ref 6–20)
CHLORIDE: 88 mmol/L — AB (ref 101–111)
CO2: 32 mmol/L (ref 22–32)
CREATININE: 0.87 mg/dL (ref 0.44–1.00)
Calcium: 9.6 mg/dL (ref 8.9–10.3)
GFR calc Af Amer: >60 mL/min (ref >60)
GFR calc non Af Amer: 57 mL/min — ABNORMAL LOW (ref >60)
GLUCOSE: 124 mg/dL — AB (ref 65–99)
Potassium: 4.3 mmol/L (ref 3.5–5.1)
Sodium: 126 mmol/L — ABNORMAL LOW (ref 135–145)

## 2015-10-29 MED ORDER — DEXTROSE 5 % IV SOLN: 1.0000 g | INTRAVENOUS | Status: DC

## 2015-10-29 MED ORDER — AZITHROMYCIN 500 MG PO TABS: 500.0000 mg | ORAL_TABLET | ORAL | Status: DC

## 2015-10-29 MED ORDER — SODIUM CHLORIDE 0.9 % IV SOLN
INTRAVENOUS | Status: DC
  Administered 2015-10-29 – 2015-11-01 (×5): via INTRAVENOUS

## 2015-10-29 MED ORDER — PROPRANOLOL HCL ER 60 MG PO CP24: 60.0000 mg | ORAL_CAPSULE | Freq: Every day | ORAL | Status: DC

## 2015-10-29 MED ORDER — LISINOPRIL 5 MG PO TABS: 5.0000 mg | ORAL_TABLET | Freq: Every day | ORAL | Status: DC

## 2015-10-29 MED ORDER — GUAIFENESIN 100 MG/5ML PO SOLN
5.0000 mL | ORAL | Status: DC | PRN
  Administered 2015-10-29 – 2015-10-31 (×3): 100 mg via ORAL
  Filled 2015-10-29 (×5): qty 5

## 2015-10-29 MED ORDER — LISINOPRIL 5 MG PO TABS
5.0000 mg | ORAL_TABLET | Freq: Every day | ORAL | Status: DC
  Administered 2015-10-30: 5 mg via ORAL
  Filled 2015-10-29 (×3): qty 1

## 2015-10-29 MED ORDER — ALPRAZOLAM 0.5 MG PO TABS
0.5000 mg | ORAL_TABLET | Freq: Every day | ORAL | Status: DC
  Filled 2015-10-29: qty 1

## 2015-10-29 MED ORDER — OLOPATADINE HCL 0.1 % OP SOLN
1.0000 [drp] | Freq: Two times a day (BID) | OPHTHALMIC | Status: DC
  Administered 2015-10-29 – 2015-11-01 (×5): 1 [drp] via OPHTHALMIC
  Filled 2015-10-29: qty 5

## 2015-10-29 MED ORDER — DEXTROSE 5 % IV SOLN
1.0000 g | INTRAVENOUS | Status: DC
  Administered 2015-10-29 – 2015-10-31 (×3): 1 g via INTRAVENOUS
  Filled 2015-10-29 (×4): qty 10

## 2015-10-29 MED ORDER — LEVOTHYROXINE SODIUM 50 MCG PO TABS
50.0000 ug | ORAL_TABLET | Freq: Every day | ORAL | Status: DC
  Administered 2015-10-30 – 2015-11-01 (×3): 50 ug via ORAL
  Filled 2015-10-29 (×3): qty 1

## 2015-10-29 MED ORDER — FAMOTIDINE 20 MG PO TABS
20.0000 mg | ORAL_TABLET | Freq: Every day | ORAL | Status: DC
  Administered 2015-10-29 – 2015-11-01 (×4): 20 mg via ORAL
  Filled 2015-10-29 (×5): qty 1

## 2015-10-29 MED ORDER — DEXTROSE 5 % IV SOLN
500.0000 mg | Freq: Once | INTRAVENOUS | Status: AC
  Administered 2015-10-29: 500 mg via INTRAVENOUS
  Filled 2015-10-29: qty 500

## 2015-10-29 MED ORDER — AZITHROMYCIN 500 MG PO TABS
500.0000 mg | ORAL_TABLET | ORAL | Status: DC
  Administered 2015-10-30 – 2015-10-31 (×2): 500 mg via ORAL
  Filled 2015-10-29 (×2): qty 1

## 2015-10-29 MED ORDER — AMLODIPINE BESYLATE 5 MG PO TABS: 5.0000 mg | ORAL_TABLET | Freq: Every day | ORAL | Status: DC

## 2015-10-29 MED ORDER — ALPRAZOLAM 0.5 MG PO TABS: 0.5000 mg | ORAL_TABLET | Freq: Every day | ORAL | Status: DC

## 2015-10-29 MED ORDER — ASPIRIN EC 81 MG PO TBEC
81.0000 mg | DELAYED_RELEASE_TABLET | Freq: Every day | ORAL | Status: DC
  Administered 2015-10-30 – 2015-11-01 (×3): 81 mg via ORAL
  Filled 2015-10-29 (×4): qty 1

## 2015-10-29 MED ORDER — ALBUTEROL SULFATE (2.5 MG/3ML) 0.083% IN NEBU
2.5000 mg | INHALATION_SOLUTION | Freq: Four times a day (QID) | RESPIRATORY_TRACT | Status: DC
  Administered 2015-10-29 – 2015-11-01 (×8): 2.5 mg via RESPIRATORY_TRACT
  Filled 2015-10-29 (×12): qty 3

## 2015-10-29 MED ORDER — ALPRAZOLAM 0.25 MG PO TABS
0.2500 mg | ORAL_TABLET | Freq: Three times a day (TID) | ORAL | Status: DC
  Administered 2015-10-29 – 2015-11-01 (×9): 0.25 mg via ORAL
  Filled 2015-10-29 (×9): qty 1

## 2015-10-29 MED ORDER — PROPRANOLOL HCL ER 60 MG PO CP24
60.0000 mg | ORAL_CAPSULE | Freq: Every day | ORAL | Status: DC
  Administered 2015-10-29 – 2015-11-01 (×4): 60 mg via ORAL
  Filled 2015-10-29 (×5): qty 1

## 2015-10-29 MED ORDER — ENOXAPARIN SODIUM 40 MG/0.4ML ~~LOC~~ SOLN
40.0000 mg | SUBCUTANEOUS | Status: DC
  Administered 2015-10-29 – 2015-10-30 (×2): 40 mg via SUBCUTANEOUS
  Filled 2015-10-29 (×3): qty 0.4

## 2015-10-29 MED ORDER — AMLODIPINE BESYLATE 5 MG PO TABS
5.0000 mg | ORAL_TABLET | Freq: Every day | ORAL | Status: DC
  Administered 2015-10-29 – 2015-11-01 (×3): 5 mg via ORAL
  Filled 2015-10-29 (×5): qty 1

## 2015-10-29 NOTE — ED Provider Notes (Signed)
CSN: [id number]     Arrival date & time 10/29/15  1148 History   First MD Initiated Contact with Patient 10/29/15 1159     Chief Complaint  Patient presents with  . Nasal Congestion     (Consider location/radiation/quality/duration/timing/severity/associated sxs/prior Treatment) HPI  81 year old female presents from urgent care with concerns for possible pneumonia. Patient has been having congestion and cough are the past 1 week. No fevers or shortness of breath. Has home health come see the patient every once while they noticed that she has had intermittent oxygen saturation to the 80s. With a recheck it seems to be in the 90s. She is not chronically on oxygen. No prior lung pathology. Patient had her oxygen checked today and since it was in the 80s again they sent her to urgent care to get an x-ray. An urgent care they noticed a white area in her right lower lobe according to the son that was concerning for pneumonia. He sent her here for further evaluation. Patient denies shortness of breath, chest pain, leg swelling, or fevers. Patient states she feels at her normal health right now. Occasionally the sputum is white. Patient thinks she has allergies.   Past Medical History  Diagnosis Date  . Hyperlipidemia   . Hypertension   . Anxiety   . GERD (gastroesophageal reflux disease)   . Abnormal heart rhythms   . Fatty liver   . Internal hemorrhoid    Past Surgical History  Procedure Laterality Date  . Cesarean section    . Abdominal hysterectomy    . Tee without cardioversion N/A 01/19/2014    Procedure: TRANSESOPHAGEAL ECHOCARDIOGRAM (TEE);  Surgeon: Laurey Morale, MD;  Location: [facility] ENDOSCOPY;  Service: Cardiovascular;  Laterality: N/A;  [name]   Family History  Problem Relation Age of Onset  . Stroke Father   . Hypertension Father   . Colon cancer Cousin   . Colon cancer      Aunt  . Diabetes      aunt and cousins  . Heart disease      cousins   Social History   Substance Use Topics  . Smoking status: Never Smoker   . Smokeless tobacco: Never Used  . Alcohol Use: No   OB History    No data available     Review of Systems  Constitutional: Negative for fever.  HENT: Positive for congestion.   Respiratory: Positive for cough. Negative for shortness of breath.   Cardiovascular: Negative for chest pain and leg swelling.  All other systems reviewed and are negative.     Allergies  Apple flavor; Peanut butter flavor; Cephalexin; Crestor; and Sulfa drugs cross reactors  Home Medications   Prior to Admission medications   Medication Sig Start Date End Date Taking? Authorizing Provider  ALPRAZolam Prudy Feeler) 0.5 MG tablet Take 1 tablet (0.5 mg total) by mouth 3 (three) times daily. 01/29/14   Mcarthur Rossetti Angiulli, PA-C  Ascorbic Acid (VITAMIN C PO) Take 1 tablet by mouth daily.    Historical Provider, MD  Aspirin 81 MG EC tablet Take 81 mg by mouth daily.    Historical Provider, MD  calcium carbonate (TUMS - DOSED IN MG ELEMENTAL CALCIUM) 500 MG chewable tablet Chew 1 tablet by mouth daily as needed for indigestion or heartburn.    Historical Provider, MD  chlorpheniramine (CHLOR-TRIMETON) 4 MG tablet Take 4 mg by mouth 2 (two) times daily as needed for allergies.    Historical Provider, MD  levothyroxine (SYNTHROID, LEVOTHROID)  50 MCG tablet Take 1 tablet (50 mcg total) by mouth daily. 01/29/14   Mcarthur Rossettianiel J Angiulli, PA-C  lisinopril (PRINIVIL,ZESTRIL) 5 MG tablet Take 5 mg by mouth daily. Take one tablet daily    Historical Provider, MD  meclizine (ANTIVERT) 12.5 MG tablet Take 1 tablet (12.5 mg total) by mouth 3 (three) times daily as needed for dizziness. 01/29/14   Mcarthur Rossettianiel J Angiulli, PA-C  naphazoline (NAPHCON) 0.1 % ophthalmic solution Place 1 drop into both eyes 4 (four) times daily -  with meals and at bedtime. 01/29/14   Mcarthur Rossettianiel J Angiulli, PA-C  pantoprazole (PROTONIX) 40 MG tablet Take 1 tablet (40 mg total) by mouth daily. 01/29/14   Mcarthur Rossettianiel J  Angiulli, PA-C  propranolol ER (INDERAL LA) 60 MG 24 hr capsule Take 1 capsule (60 mg total) by mouth daily. 01/29/14   Mcarthur Rossettianiel J Angiulli, PA-C   BP 169/64 mmHg  Pulse 63  Temp(Src) 97.5 F (36.4 C) (Oral)  Resp 18  Ht 5\' 2"  (1.575 m)  Wt 195 lb (88.451 kg)  BMI 35.66 kg/m2  SpO2 94% Physical Exam  Constitutional: She is oriented to person, place, and time. She appears well-developed and well-nourished. No distress.  HENT:  Head: Normocephalic and atraumatic.  Right Ear: External ear normal.  Left Ear: External ear normal.  Nose: Nose normal.  Eyes: Right eye exhibits no discharge. Left eye exhibits no discharge.  Cardiovascular: Normal rate, regular rhythm and normal heart sounds.   Pulmonary/Chest: Effort normal. She has decreased breath sounds in the right lower field. She has rales in the right lower field.  Abdominal: Soft. There is no tenderness.  Neurological: She is alert and oriented to person, place, and time.  Skin: Skin is warm and dry. She is not diaphoretic.  Nursing note and vitals reviewed.   ED Course  Procedures (including critical care time) Labs Review Labs Reviewed  BASIC METABOLIC PANEL - Abnormal; Notable for the following:    Sodium 126 (*)    Chloride 88 (*)    Glucose, Bld 124 (*)    GFR calc non Af Amer 57 (*)    All other components within normal limits  CBC WITH DIFFERENTIAL/PLATELET - Abnormal; Notable for the following:    WBC 10.7 (*)    All other components within normal limits  CULTURE, BLOOD (ROUTINE X 2)  CULTURE, BLOOD (ROUTINE X 2)  BRAIN NATRIURETIC PEPTIDE    Imaging Review Dg Chest 2 View  10/29/2015  CLINICAL DATA:  Cough and congestion EXAM: CHEST  2 VIEW COMPARISON:  01/12/2014 chest radiograph. FINDINGS: Stable prominent elevation of the right hemidiaphragm. Stable cardiomediastinal silhouette with normal heart size. No pneumothorax. No pleural effusion. No pulmonary edema. Patchy opacity at the right lung base is not  appreciably changed. No pulmonary edema. IMPRESSION: Stable prominent chronic elevation of the right hemidiaphragm. Patchy opacity at the right lung base appears stable compared to prior chest radiographs, suggesting atelectasis, although a superimposed pneumonia would be difficult to exclude and clinical correlation is necessary. Electronically Signed   By: Delbert PhenixJason A Poff M.D.   On: 10/29/2015 12:50   I have personally reviewed and evaluated these images and lab results as part of my medical decision-making.   EKG Interpretation   Date/Time:  Saturday October 29 2015 12:08:51 EST Ventricular Rate:  68 PR Interval:  158 QRS Duration: 83 QT Interval:  445 QTC Calculation: 473 R Axis:   55 Text Interpretation:  Sinus rhythm Borderline T wave abnormalities rate is  slower compared to 2015 Confirmed by Kore Madlock  MD, Hutchinson Isenberg ([id number]) on  10/29/2015 12:22:37 PM      MDM   Final diagnoses:  CAP (community acquired pneumonia)  Hyponatremia    Patient's upper respiratory symptoms are concerning for pneumonia, especially with intermittent desaturations. When she was at her PCP they ambulated her and her O2 went to 85%. She also has hyponatremia of unclear etiology. Could be due to decreased PO intake. Due to the combination, will treat with IV antibiotics, oxygen support and admission to hospitalist.     Pricilla Loveless, MD 10/29/15 [phone]

## 2015-10-29 NOTE — Progress Notes (Signed)
NURSING PROGRESS NOTE  Tammy BeachRosa L Tomasik [id number] Admission Data: 10/29/2015 4:13 PM Attending Provider: Alba CoryBelkys A Regalado, MD NWG:NFAOZ,HYQMVHPCP:READE,ROBERT Lyn HollingsheadALEXANDER, MD Code Status: FULL  Tammy Fernandez is a 81 y.o. female patient admitted from ED:  -No acute distress noted.  -No complaints of shortness of breath.  -No complaints of chest pain.   Cardiac Monitoring: N/A  Blood pressure 164/60, pulse 75, temperature 97.8 F (36.6 C), temperature source Oral, resp. rate 24, height 5\' 2"  (1.575 m), weight 88.451 kg (195 lb), SpO2 97 %.   IV Fluids:  IV in place, occlusive dsg intact without redness, IV cath hand left, condition patent and no redness normal saline.   Allergies:  Apple flavor; Peanut butter flavor; Cephalexin; Crestor; and Sulfa drugs cross reactors  Past Medical History:   has a past medical history of Hyperlipidemia; Hypertension; Anxiety; GERD (gastroesophageal reflux disease); Abnormal heart rhythms; Fatty liver; and Internal hemorrhoid.  Past Surgical History:   has past surgical history that includes Cesarean section; Abdominal hysterectomy; and TEE without cardioversion (N/A, 01/19/2014).  Social History:   reports that she has never smoked. She has never used smokeless tobacco. She reports that she does not drink alcohol or use illicit drugs.  Skin: Intact  Patient/Family orientated to room. Information packet given to patient/family. Admission inpatient armband information verified with patient/family to include name and date of birth and placed on patient arm. Side rails up x 2, fall assessment and education completed with patient/family. Patient/family able to verbalize understanding of risk associated with falls and verbalized understanding to call for assistance before getting out of bed. Call light within reach. Patient/family able to voice and demonstrate understanding of unit orientation instructions.    Will continue to evaluate and treat per MD orders.  Bennie Pieriniyndi  Da Authement, RN

## 2015-10-29 NOTE — H&P (Signed)
[facility] History and Physical                                                                                    Tammy Fernandez, is a 81 y.o. female  MRN: [id number]   DOB - January 01, 1924  Admit Date - 10/29/2015  Outpatient Primary MD for the patient is [name], MD  Referring MD: Criss Alvine / ER  With History of -  Past Medical History  Diagnosis Date  . Hyperlipidemia   . Hypertension   . Anxiety   . GERD (gastroesophageal reflux disease)   . Abnormal heart rhythms   . Fatty liver   . Internal hemorrhoid       Past Surgical History  Procedure Laterality Date  . Cesarean section    . Abdominal hysterectomy    . Tee without cardioversion N/A 01/19/2014    Procedure: TRANSESOPHAGEAL ECHOCARDIOGRAM (TEE);  Surgeon: Laurey Morale, MD;  Location: [facility] ENDOSCOPY;  Service: Cardiovascular;  Laterality: N/A;  [name]    in for   Chief Complaint  Patient presents with  . Nasal Congestion     HPI This is an 81 year old female patient with history of stroke in 2015 for low recovered, hypertension, dyslipidemia, anxiety, GERD who was sent to the ER today for hypoxemia and pneumonia findings on chest x-ray. The patient has been experiencing Upper respiratory infection type symptoms that she describes as "allergies". This has been associated with congestion and cough for one week. She was treating herself with over-the-counter medications including Benadryl. She denied fevers or shortness of breath. She receives periodic visits from home health nurse who occasionally checks the patient's O2 sats were in the 80s. She does not have any chronic lung disease nor she chronically on oxygen. Because of this finding she was sent to urgent care for further evaluation. A chest x-ray was obtained and demonstrated opacity in the right lower lobe and she was sent to [facility] ER for further evaluation.  In the ER she was afebrile and hemodynamically stable with room air  saturations 94-95% at rest. Ambulatory sats were not checked. X-ray films were reviewed and given chronic elevated right hemidiaphragm PA view unrevealing regarding the possible opacity but there is an area on the lateral view consistent with radiologist findings. Laboratory data revealed hyponatremia (sodium 126) which is a new finding for this patient, chloride also low at 88, glucose 124, BNP normal at 54, white count 10,700 without a left shift.  In further discussion with the patient she continues to minimize her symptoms. She will 4 she had allergy symptoms and was worse last week but that has improved. She is asking if she can go home tomorrow. She does endorse she is having intermittent coughing with white sputum and poor oral intake over the past 5-7 days.  Review of Systems   In addition to the HPI above,  No Fever-chills, myalgias or other constitutional symptoms No Headache, changes with Vision or hearing, new weakness, tingling, numbness in any extremity, No problems swallowing food or Liquids, indigestion/reflux No Chest pain, Cough or Shortness of Breath, palpitations, orthopnea or DOE No Abdominal pain, N/V; no melena or  hematochezia, no dark tarry stools, Bowel movements are regular, No dysuria, hematuria or flank pain No new skin rashes, lesions, masses or bruises, No new joints pains-aches No recent weight gain or loss No polyuria, polydypsia or polyphagia,  *A full 10 point Review of Systems was done, except as stated above, all other Review of Systems were negative.  Social History Social History  Substance Use Topics  . Smoking status: Never Smoker   . Smokeless tobacco: Never Used  . Alcohol Use: No    Resides at: Private residence  Lives with: Granddaughter  Ambulatory status: Cane   Family History Family History  Problem Relation Age of Onset  . Stroke Father   . Hypertension Father   . Colon cancer Cousin   . Colon cancer      Aunt  . Diabetes       aunt and cousins  . Heart disease      cousins     Prior to Admission medications   Medication Sig Start Date End Date Taking? Authorizing Provider  ALPRAZolam Prudy Feeler) 0.5 MG tablet Take 0.5 mg by mouth daily. 10/17/15  Yes Historical Provider, MD  amLODipine (NORVASC) 5 MG tablet Take 5 mg by mouth daily. 08/06/15  Yes Historical Provider, MD  Ascorbic Acid (VITAMIN C PO) Take 1 tablet by mouth daily.   Yes Historical Provider, MD  Aspirin 81 MG EC tablet Take 81 mg by mouth daily.   Yes Historical Provider, MD  calcium carbonate (TUMS - DOSED IN MG ELEMENTAL CALCIUM) 500 MG chewable tablet Chew 1 tablet by mouth daily as needed for indigestion or heartburn.   Yes Historical Provider, MD  chlorpheniramine (CHLOR-TRIMETON) 4 MG tablet Take 4 mg by mouth 2 (two) times daily as needed for allergies.   Yes Historical Provider, MD  levothyroxine (SYNTHROID, LEVOTHROID) 50 MCG tablet Take 1 tablet (50 mcg total) by mouth daily. 01/29/14  Yes Daniel J Angiulli, PA-C  lisinopril (PRINIVIL,ZESTRIL) 5 MG tablet Take 5 mg by mouth daily. Take one tablet daily   Yes Historical Provider, MD  meclizine (ANTIVERT) 12.5 MG tablet TK 1 TO 2 TS PO Q 6 H PRF DIZZINESS 10/24/15  Yes Historical Provider, MD  olopatadine (PATANOL) 0.1 % ophthalmic solution Place 1 drop into both eyes daily as needed. 10/12/15  Yes Historical Provider, MD  pantoprazole (PROTONIX) 40 MG tablet Take 1 tablet (40 mg total) by mouth daily. 01/29/14  Yes Daniel J Angiulli, PA-C  PATADAY 0.2 % SOLN Place 1 drop into both eyes daily. 10/09/15  Yes Historical Provider, MD  propranolol ER (INDERAL LA) 60 MG 24 hr capsule Take 1 capsule (60 mg total) by mouth daily. 01/29/14  Yes Daniel J Angiulli, PA-C  ranitidine (ZANTAC) 300 MG tablet Take 300 mg by mouth at bedtime. 10/17/15  Yes Historical Provider, MD    Allergies  Allergen Reactions  . Apple Flavor Hives    No apple sauce  . Peanut Butter Flavor Hives  . Cephalexin Other (See Comments)     dizziness  . Crestor [Rosuvastatin Calcium]   . Sulfa Drugs Cross Reactors Rash    Physical Exam  Vitals  Blood pressure 137/60, pulse 61, temperature 97.5 F (36.4 C), temperature source Oral, resp. rate 16, height 5\' 2"  (1.575 m), weight 195 lb (88.451 kg), SpO2 94 %.   General:  In no acute distress, appears healthy and well nourished and stated a  Psych:  Normal affect, Denies Suicidal or Homicidal ideations, Awake Alert, Oriented X 3.  Speech and thought patterns are clear and appropriate, no apparent short term memory deficits  Neuro:   No focal neurological deficits, CN II through XII intact, Strength 5/5 all 4 extremities, Sensation intact all 4 extremities.  ENT:  Ears and Eyes appear Normal, Conjunctivae clear, PER. Moist oral mucosa without erythema or exudates.  Neck:  Supple, No lymphadenopathy appreciated  Respiratory:  Symmetrical chest wall movement, white diminished air movement bilaterally auscultated posteriorly, overall clear without wheezing. Room Air  Cardiac:  RRR, No Murmurs, no LE edema noted, no JVD, No carotid bruits, peripheral pulses palpable at 2+  Abdomen:  Positive bowel sounds, Soft, Non tender, Non distended,  No masses appreciated, no obvious hepatosplenomegaly  Skin:  No Cyanosis, Normal Skin Turgor, No Skin Rash or Bruise.  Extremities: Symmetrical without obvious trauma or injury,  no effusions.  Data Review  CBC  Recent Labs Lab 10/29/15 1318  WBC 10.7*  HGB 14.0  HCT 43.4  PLT 346  MCV 85.9  MCH 27.7  MCHC 32.3  RDW 14.4  LYMPHSABS 3.1  MONOABS 0.8  EOSABS 0.3  BASOSABS 0.0    Chemistries   Recent Labs Lab 10/29/15 1318  NA 126*  K 4.3  CL 88*  CO2 32  GLUCOSE 124*  BUN 8  CREATININE 0.87  CALCIUM 9.6    estimated creatinine clearance is 45.3 mL/min (by C-G formula based on Cr of 0.87).  No results for input(s): TSH, T4TOTAL, T3FREE, THYROIDAB in the last 72 hours.  Invalid input(s):  FREET3  Coagulation profile No results for input(s): INR, PROTIME in the last 168 hours.  No results for input(s): DDIMER in the last 72 hours.  Cardiac Enzymes No results for input(s): CKMB, TROPONINI, MYOGLOBIN in the last 168 hours.  Invalid input(s): CK  Invalid input(s): POCBNP  Urinalysis    Component Value Date/Time   COLORURINE YELLOW 01/11/2014 1951   APPEARANCEUR CLOUDY* 01/11/2014 1951   LABSPEC 1.017 01/11/2014 1951   PHURINE 6.5 01/11/2014 1951   GLUCOSEU NEGATIVE 01/11/2014 1951   HGBUR LARGE* 01/11/2014 1951   BILIRUBINUR NEGATIVE 01/11/2014 1951   KETONESUR 15* 01/11/2014 1951   PROTEINUR 30* 01/11/2014 1951   UROBILINOGEN 0.2 01/11/2014 1951   NITRITE NEGATIVE 01/11/2014 1951   LEUKOCYTESUR MODERATE* 01/11/2014 1951    Imaging results:   Dg Chest 2 View  10/29/2015  CLINICAL DATA:  Cough and congestion EXAM: CHEST  2 VIEW COMPARISON:  01/12/2014 chest radiograph. FINDINGS: Stable prominent elevation of the right hemidiaphragm. Stable cardiomediastinal silhouette with normal heart size. No pneumothorax. No pleural effusion. No pulmonary edema. Patchy opacity at the right lung base is not appreciably changed. No pulmonary edema. IMPRESSION: Stable prominent chronic elevation of the right hemidiaphragm. Patchy opacity at the right lung base appears stable compared to prior chest radiographs, suggesting atelectasis, although a superimposed pneumonia would be difficult to exclude and clinical correlation is necessary. Electronically Signed   By: Delbert Phenix M.D.   On: 10/29/2015 12:50     EKG: (Independently reviewed) sinus rhythm with ventricular rate 60 bpm, QTC 473 ms, voltage criteria consistent with LVH, nonspecific T-wave changes in V2 V3 and lead III, no ischemic changes   Assessment & Plan  Principal Problem:   Acute respiratory failure with hypoxia 2/2 CAP  -Medical floor -Empiric Rocephin and Zithromax -Blood cultures, HIV, urinary strep and  Legionella -Sputum culture -Supportive care with oxygen as needed and pulmonary toileting with flutter valve -Check room air ambulatory sats  Active Problems:  Hypertension -Currently controlled -Continue preadmission medications -Echocardiogram 2015 with mild concentric hypertrophy and mild grade 1 diastolic dysfunction    Acute hyponatremia -Appears consistent with volume depletion given history -Check urine sodium stat -Begin normal saline at 100 mL per hour -Labs in a.m.    History of CVA (cerebrovascular accident) -No significant sequela -Walks with a cane    DVT Prophylaxis: Lovenox  Family Communication:   Multiple family members at bedside  Code Status: Full code   Condition:  Stable  Discharge disposition: Anticipate discharge within 48 hours back to home environment; into new preadmission home health  Time spent in minutes : 60      Samaj Wessells L. ANP on 10/29/2015 at 2:52 PM  Between 7am to 7pm - Pager - [phone]  After 7pm go to [email or web address] - password [email or web address]  And look for the night coverage person covering me after hours  [facility]

## 2015-10-29 NOTE — ED Notes (Signed)
Pt reports she has had allergy and cold symptoms that began 4-5 days ago and pt reports the symptoms are improving.  Pt reports her home rehab nurse checked her O2 sat on Friday and reported it was low.  [facility].

## 2015-10-29 NOTE — Progress Notes (Signed)
Pt refused to ambulate tonight to check walking oxygen saturation. Pt stated, "I will let ya'll do it tomorrow".

## 2015-10-30 ENCOUNTER — Inpatient Hospital Stay (HOSPITAL_COMMUNITY): Payer: Medicare Other

## 2015-10-30 DIAGNOSIS — E871 Hypo-osmolality and hyponatremia: Secondary | ICD-10-CM

## 2015-10-30 DIAGNOSIS — J189 Pneumonia, unspecified organism: Principal | ICD-10-CM

## 2015-10-30 DIAGNOSIS — I10 Essential (primary) hypertension: Secondary | ICD-10-CM

## 2015-10-30 DIAGNOSIS — J9601 Acute respiratory failure with hypoxia: Secondary | ICD-10-CM

## 2015-10-30 LAB — CBC
HCT: 39.2 % (ref 36.0–46.0)
Hemoglobin: 12.1 g/dL (ref 12.0–15.0)
MCH: 26.8 pg (ref 26.0–34.0)
MCHC: 30.9 g/dL (ref 30.0–36.0)
MCV: 86.7 fL (ref 78.0–100.0)
PLATELETS: 300 10*3/uL (ref 150–400)
RBC: 4.52 MIL/uL (ref 3.87–5.11)
RDW: 14.7 % (ref 11.5–15.5)
WBC: 9.5 10*3/uL (ref 4.0–10.5)

## 2015-10-30 LAB — BASIC METABOLIC PANEL
Anion gap: 7 (ref 5–15)
BUN: 8 mg/dL (ref 6–20)
CALCIUM: 9 mg/dL (ref 8.9–10.3)
CO2: 28 mmol/L (ref 22–32)
CREATININE: 0.76 mg/dL (ref 0.44–1.00)
Chloride: 94 mmol/L — ABNORMAL LOW (ref 101–111)
GFR calc Af Amer: >60 mL/min (ref >60)
GLUCOSE: 148 mg/dL — AB (ref 65–99)
Potassium: 4.9 mmol/L (ref 3.5–5.1)
SODIUM: 129 mmol/L — AB (ref 135–145)

## 2015-10-30 LAB — HIV ANTIBODY (ROUTINE TESTING W REFLEX): HIV Screen 4th Generation wRfx: NONREACTIVE

## 2015-10-30 MED ORDER — BENZONATATE 100 MG PO CAPS
200.0000 mg | ORAL_CAPSULE | Freq: Three times a day (TID) | ORAL | Status: DC
  Administered 2015-10-30 – 2015-11-01 (×6): 200 mg via ORAL
  Filled 2015-10-30 (×6): qty 2

## 2015-10-30 NOTE — Progress Notes (Signed)
PROGRESS NOTE  Tammy BeachRosa L Hengel ZOX:[id number] DOB: 1924-10-22 DOA: 10/29/2015 PCP: [name],[name], MD  Brief History 81 year old female with a history of stroke, hypertension, hyperlipidemia, GERD presented with one-week history of cough, shortness of breath. The patient's home health physical therapist noted the patient to be increasingly short of breath and found her oxygen saturations to be in the 80s. The patient was sent to urgent care. Chest x-ray suggested a right lower lobe opacity. From there, the patient was sent to the emergency department for further evaluation. The patient denied any fevers, chills, nausea, vomiting, diarrhea, chest pain, hemoptysis. She denies any abdominal pain, hematochezia, melena. She denies any recent sick contacts.  Assessment/Plan: Acute respiratory failure with hypoxia -secondary to CAP -presently stable on 2L -wean oxygen for sat >92% -pulmonary hygiene  CAP -continue azithromycin and ceftriaxone -follow up blood cultures -urine legionella antigen -urine streptococcus pneumoniae antigen  Hyponatremia -secondary to volume depletion -improving with IVF -follow labs  History Stroke -PT evaluation -continue ASA  HTN -controlled lisinopril -Continue propranolol, amlodipine  Hypothyroidism -Continue levothyroxine  GERD -continue pepcid  Hyperglycemia -check A1c  Family Communication:   Son updated at beside Disposition Plan:   Home when medically stable        Procedures/Studies: Dg Chest 2 View  10/30/2015  CLINICAL DATA:  Community acquired pneumonia EXAM: CHEST  2 VIEW COMPARISON:  10/29/2015 FINDINGS: Chronic elevation of the right hemidiaphragm. Degenerative changes bilateral shoulders. Atherosclerotic calcifications of thoracic aorta. There is patchy right perihilar atelectasis or infiltrate. No pulmonary edema. IMPRESSION: Chronic elevation of the right hemidiaphragm. Patchy right perihilar atelectasis  or infiltrate. Electronically Signed   By: Natasha MeadLiviu  Pop M.D.   On: 10/30/2015 09:09   Dg Chest 2 View  10/29/2015  CLINICAL DATA:  Cough and congestion EXAM: CHEST  2 VIEW COMPARISON:  01/12/2014 chest radiograph. FINDINGS: Stable prominent elevation of the right hemidiaphragm. Stable cardiomediastinal silhouette with normal heart size. No pneumothorax. No pleural effusion. No pulmonary edema. Patchy opacity at the right lung base is not appreciably changed. No pulmonary edema. IMPRESSION: Stable prominent chronic elevation of the right hemidiaphragm. Patchy opacity at the right lung base appears stable compared to prior chest radiographs, suggesting atelectasis, although a superimposed pneumonia would be difficult to exclude and clinical correlation is necessary. Electronically Signed   By: Delbert PhenixJason A Poff M.D.   On: 10/29/2015 12:50         Subjective: Patient complains of non-productive cough. She has some dyspnea with exertion. Denies any fevers, chest, chest pain, nausea, vomiting, diarrhea, abdominal pain. No dysuria or hematuria.  Objective: Filed Vitals:   10/29/15 2100 10/30/15 0138 10/30/15 0452 10/30/15 0845  BP: 128/72  138/51   Pulse: 72  71   Temp: 98.3 F (36.8 C)  97.7 F (36.5 C)   TempSrc: Oral  Oral   Resp: 18  18   Height:      Weight:      SpO2: 93% 94% 90% 94%    Intake/Output Summary (Last 24 hours) at 10/30/15 1244 Last data filed at 10/30/15 1028  Gross per 24 hour  Intake    120 ml  Output   1400 ml  Net  -1280 ml   Weight change:  Exam:   General:  Pt is alert, follows commands appropriately, not in acute distress  HEENT: No icterus, No thrush, No neck mass, [place]/AT  Cardiovascular: RRR, S1/S2, no rubs, no gallops  Respiratory: Bibasilar crackles  with diminished breath sounds, right greater than left. Diminished breath sounds on the right.  Abdomen: Soft/+BS, non tender, non distended, no guarding; no hepatosplenomegaly  Extremities: No edema, No  lymphangitis, No petechiae, No rashes, no synovitis  Data Reviewed: Basic Metabolic Panel:  Recent Labs Lab 10/29/15 1318 10/30/15 0433  NA 126* 129*  K 4.3 4.9  CL 88* 94*  CO2 32 28  GLUCOSE 124* 148*  BUN 8 8  CREATININE 0.87 0.76  CALCIUM 9.6 9.0   Liver Function Tests: No results for input(s): AST, ALT, ALKPHOS, BILITOT, PROT, ALBUMIN in the last 168 hours. No results for input(s): LIPASE, AMYLASE in the last 168 hours. No results for input(s): AMMONIA in the last 168 hours. CBC:  Recent Labs Lab 10/29/15 1318 10/30/15 0433  WBC 10.7* 9.5  NEUTROABS 6.5  --   HGB 14.0 12.1  HCT 43.4 39.2  MCV 85.9 86.7  PLT 346 300   Cardiac Enzymes: No results for input(s): CKTOTAL, CKMB, CKMBINDEX, TROPONINI in the last 168 hours. BNP: Invalid input(s): POCBNP CBG: No results for input(s): GLUCAP in the last 168 hours.  Recent Results (from the past 240 hour(s))  Culture, blood (routine x 2)     Status: None (Preliminary result)   Collection Time: 10/29/15  2:40 PM  Result Value Ref Range Status   Specimen Description BLOOD LEFT HAND  Final   Special Requests BOTTLES DRAWN AEROBIC AND ANAEROBIC 5CC  Final   Culture NO GROWTH < 24 HOURS  Final   Report Status PENDING  Incomplete  Culture, blood (routine x 2)     Status: None (Preliminary result)   Collection Time: 10/29/15  2:50 PM  Result Value Ref Range Status   Specimen Description BLOOD RIGHT HAND  Final   Special Requests BOTTLES DRAWN AEROBIC AND ANAEROBIC 5CC  Final   Culture NO GROWTH < 24 HOURS  Final   Report Status PENDING  Incomplete     Scheduled Meds: . albuterol  2.5 mg Nebulization Q6H  . ALPRAZolam  0.25 mg Oral TID  . amLODipine  5 mg Oral Daily  . aspirin EC  81 mg Oral Daily  . azithromycin  500 mg Oral Q24H  . cefTRIAXone (ROCEPHIN)  IV  1 g Intravenous Q24H  . enoxaparin (LOVENOX) injection  40 mg Subcutaneous Q24H  . famotidine  20 mg Oral Daily  . levothyroxine  50 mcg Oral QAC  breakfast  . lisinopril  5 mg Oral Daily  . olopatadine  1 drop Both Eyes BID  . propranolol ER  60 mg Oral Daily   Continuous Infusions: . sodium chloride 100 mL/hr at 10/30/15 0059     Chloey Ricard, DO  [facility] Pager [phone]  If 7PM-7AM, please contact night-coverage [email or web address] Password [email or web address] 10/30/2015, 12:44 PM   LOS: 1 day

## 2015-10-31 LAB — CBC
HEMATOCRIT: 40.1 % (ref 36.0–46.0)
HEMOGLOBIN: 12.2 g/dL (ref 12.0–15.0)
MCH: 27.2 pg (ref 26.0–34.0)
MCHC: 30.4 g/dL (ref 30.0–36.0)
MCV: 89.5 fL (ref 78.0–100.0)
Platelets: 337 10*3/uL (ref 150–400)
RBC: 4.48 MIL/uL (ref 3.87–5.11)
RDW: 14.8 % (ref 11.5–15.5)
WBC: 9.3 10*3/uL (ref 4.0–10.5)

## 2015-10-31 LAB — BASIC METABOLIC PANEL
Anion gap: 5 (ref 5–15)
BUN: 6 mg/dL (ref 6–20)
CHLORIDE: 94 mmol/L — AB (ref 101–111)
CO2: 33 mmol/L — AB (ref 22–32)
CREATININE: 0.72 mg/dL (ref 0.44–1.00)
Calcium: 9 mg/dL (ref 8.9–10.3)
GFR calc non Af Amer: >60 mL/min (ref >60)
Glucose, Bld: 127 mg/dL — ABNORMAL HIGH (ref 65–99)
Potassium: 5 mmol/L (ref 3.5–5.1)
Sodium: 132 mmol/L — ABNORMAL LOW (ref 135–145)

## 2015-10-31 MED ORDER — MECLIZINE HCL 12.5 MG PO TABS
12.5000 mg | ORAL_TABLET | Freq: Three times a day (TID) | ORAL | Status: DC | PRN
  Administered 2015-10-31: 12.5 mg via ORAL
  Filled 2015-10-31 (×2): qty 1

## 2015-10-31 NOTE — Progress Notes (Signed)
Made Dr Tat aware of pt reporting [facility]iness [facility] Will continue to monitor pt. Bed remains in lowest position and call bell is within reach.

## 2015-10-31 NOTE — Evaluation (Signed)
Physical Therapy Evaluation Patient Details Name: Tammy BeachRosa L Cargo MRN: [id number] DOB: 11-14-1924 Today's Date: 10/31/2015   History of Present Illness  This is an 81 year old female patient with history of stroke in 2015 fully recovered, hypertension, dyslipidemia, anxiety, GERD who was sent to the ER today for hypoxemia and pneumonia findings on chest x-ray.  Clinical Impression  Pt admitted with above diagnosis. Pt currently with functional limitations due to the deficits listed below (see PT Problem List). Pt dizzy on eval, reports that she takes meclizine daily at home and has not had it here. This limited ambulation to 40' and pt required min A and RW for safety with LOB. Pt desat to 79% on RA with exertion.  Pt will benefit from skilled PT to increase their independence and safety with mobility to allow discharge to the venue listed below.       Follow Up Recommendations Home health PT;Supervision/Assistance - 24 hour    Equipment Recommendations  None recommended by PT    Recommendations for Other Services       Precautions / Restrictions Precautions Precautions: Fall Restrictions Weight Bearing Restrictions: No      Mobility  Bed Mobility Overal bed mobility: Needs Assistance Bed Mobility: Supine to Sit;Sit to Supine     Supine to sit: Min assist Sit to supine: Min assist   General bed mobility comments: pt with difficulty getting to EOB with use of rail and HOB slightly elevated. Min A for full elevation of trunk. Min A to LE's for return to supine  Transfers Overall transfer level: Needs assistance Equipment used: Rolling walker (2 wheeled) Transfers: Sit to/from [employer] CorporationStand;Stand Pivot Transfers Sit to Stand: Min assist Stand pivot transfers: Min assist       General transfer comment: min A for power up from bed, min A to steady [facility]  Ambulation/Gait Ambulation/Gait assistance: Min assist Ambulation Distance (Feet): 40 Feet Assistive device: Rolling  walker (2 wheeled) Gait Pattern/deviations: Step-through pattern;Decreased stride length;Shuffle;Trunk flexed Gait velocity: decreased Gait velocity interpretation: <1.8 ft/sec, indicative of risk for recurrent falls General Gait Details: vc's for erect posture, pt c/o dizziness (has not received her allergy meds or meclizine that she takes daily). Min A to steady during turning and pt very fatigued on way back to bed, last 20'.   Stairs            Wheelchair Mobility    Modified Rankin (Stroke Patients Only)       Balance Overall balance assessment: Needs assistance Sitting-balance support: Single extremity supported;Feet supported Sitting balance-[name]: Poor Sitting balance - Comments: needed UE support sitting EOB   Standing balance support: Bilateral upper extremity supported;During functional activity Standing balance-[name]: Poor Standing balance comment: requires UE support for standing                             Pertinent Vitals/Pain Pain Assessment: No/denies pain  See comments    Home Living Family/patient expects to be discharged to:: Private residence Living Arrangements: Children Available Help at Discharge: Family;Available 24 hours/day Type of Home: House Home Access: Stairs to enter Entrance Stairs-Rails: Left Entrance Stairs-Number of Steps: 3 to porch + threshold Home Layout: One level Home Equipment: Walker - 2 wheels;Cane - single point;Bedside commode;Wheelchair - manual Additional Comments: daughter and son live with her, someone with her 24/7    Prior Function Level of Independence: Needs assistance   Gait / Transfers Assistance Needed: PT  has been working with her at home, distance limited, using RW  ADL's / Homemaking Assistance Needed: daughter helps pt sponge bathe        Hand Dominance        Extremity/Trunk Assessment   Upper Extremity Assessment: Generalized weakness           Lower Extremity  Assessment: Generalized weakness      Cervical / Trunk Assessment: Kyphotic  Communication   Communication: No difficulties  Cognition Arousal/Alertness: Awake/alert Behavior During Therapy: WFL for tasks assessed/performed Overall Cognitive Status: Within Functional Limits for tasks assessed                      General Comments General comments (skin integrity, edema, etc.): O2 sats 93% on 2L O2, 79% on RA with ambulation, 93-97% on 2L O2.     Exercises        Assessment/Plan    PT Assessment Patient needs continued PT services  PT Diagnosis Difficulty walking;Generalized weakness;Abnormality of gait   PT Problem List Decreased strength;Decreased activity tolerance;Decreased balance;Decreased mobility;Decreased knowledge of use of DME;Cardiopulmonary status limiting activity;Decreased knowledge of precautions  PT Treatment Interventions DME instruction;Gait training;Stair training;Functional mobility training;Therapeutic activities;Therapeutic exercise;Balance training;Patient/family education   PT Goals (Current goals can be found in the Care Plan section) Acute Rehab PT Goals Patient Stated Goal: return home PT Goal Formulation: With patient Time For Goal Achievement: 11/14/15 Potential to Achieve Goals: Good    Frequency Min 3X/week   Barriers to discharge        Co-evaluation               End of Session Equipment Utilized During Treatment: Gait belt Activity Tolerance: Patient tolerated treatment well Patient left: in bed;with call bell/phone within reach;with family/visitor present Nurse Communication: Mobility status         Time: 1004-1029 PT Time Calculation (min) (ACUTE ONLY): 25 min   Charges:   PT Evaluation $Initial PT Evaluation Tier I: 1 Procedure PT Treatments $Therapeutic Activity: 8-22 mins   PT G Codes:      Lyanne Co, PT  Acute Rehab Services  [phone]   Lyanne Co 10/31/2015, 11:34 AM

## 2015-10-31 NOTE — Progress Notes (Signed)
Utilization review completed. Jewelz Ricklefs, RN, BSN. 

## 2015-10-31 NOTE — Progress Notes (Signed)
PROGRESS NOTE  Hollie BeachRosa L Oloughlin ZOX:[id number] DOB: 1924/09/04 DOA: 10/29/2015 PCP: [name],[name], MD  Brief History 81 year old female with a history of stroke, hypertension, hyperlipidemia, GERD presented with one-week history of cough, shortness of breath. The patient's home health physical therapist noted the patient to be increasingly short of breath and found her oxygen saturations to be in the 80s. The patient was sent to urgent care. Chest x-ray suggested a right lower lobe opacity. From there, the patient was sent to the emergency department for further evaluation. The patient denied any fevers, chills, nausea, vomiting, diarrhea, chest pain, hemoptysis. She denies any abdominal pain, hematochezia, melena. She denies any recent sick contacts.  Assessment/Plan: Acute respiratory failure with hypoxia -secondary to CAP -presently stable on 2L -wean oxygen for sat >92% -pulmonary hygiene -ambulatory pulse oximetry revealed desaturation on room air - Patient will go home on 2 L nasal cannula -suspect underlying OHS/OSA -will need outpt polysomnogram  CAP -continue azithromycin and ceftriaxone -follow up blood cultures--neg  Hyponatremia -secondary to volume depletion -improving with IVF -follow labs  History Stroke -PT evaluation-->HHPT -continue ASA  HTN -hold lisinopril -Continue propranolol, amlodipine  Hypothyroidism -Continue levothyroxine  GERD -continue pepcid  Hyperglycemia -check A1c--pending  Family Communication: Son updated at beside 11/28 Disposition Plan: Home when medically stable   Procedures/Studies: Dg Chest 2 View  10/30/2015  CLINICAL DATA:  Community acquired pneumonia EXAM: CHEST  2 VIEW COMPARISON:  10/29/2015 FINDINGS: Chronic elevation of the right hemidiaphragm. Degenerative changes bilateral shoulders. Atherosclerotic calcifications of thoracic aorta. There is patchy right perihilar atelectasis or infiltrate. No  pulmonary edema. IMPRESSION: Chronic elevation of the right hemidiaphragm. Patchy right perihilar atelectasis or infiltrate. Electronically Signed   By: Natasha MeadLiviu  Pop M.D.   On: 10/30/2015 09:09   Dg Chest 2 View  10/29/2015  CLINICAL DATA:  Cough and congestion EXAM: CHEST  2 VIEW COMPARISON:  01/12/2014 chest radiograph. FINDINGS: Stable prominent elevation of the right hemidiaphragm. Stable cardiomediastinal silhouette with normal heart size. No pneumothorax. No pleural effusion. No pulmonary edema. Patchy opacity at the right lung base is not appreciably changed. No pulmonary edema. IMPRESSION: Stable prominent chronic elevation of the right hemidiaphragm. Patchy opacity at the right lung base appears stable compared to prior chest radiographs, suggesting atelectasis, although a superimposed pneumonia would be difficult to exclude and clinical correlation is necessary. Electronically Signed   By: Delbert PhenixJason A Poff M.D.   On: 10/29/2015 12:50         Subjective: The patient still has some dyspnea on exertion but overall is feeling better. She has nonproductive cough. Denies any fevers, chills, chest pain, nausea, vomiting, diarrhea, abdominal pain, dysuria, hematuria. No rashes.  Objective: Filed Vitals:   10/31/15 0557 10/31/15 0835 10/31/15 0933 10/31/15 1348  BP: 157/58 151/54  138/48  Pulse: 87 77  81  Temp: 99.2 F (37.3 C)   97.8 F (36.6 C)  TempSrc: Oral   Oral  Resp: 16   16  Height:      Weight:      SpO2: 94%  94% 93%    Intake/Output Summary (Last 24 hours) at 10/31/15 1914 Last data filed at 10/31/15 1751  Gross per 24 hour  Intake    582 ml  Output    800 ml  Net   -218 ml   Weight change:  Exam:   General:  Pt is alert, follows commands appropriately, not in acute distress  HEENT: No icterus,  No thrush, No neck mass, [place]/AT  Cardiovascular: RRR, S1/S2, no rubs, no gallops  Respiratory: bibasilar crackles. No wheezing. Good air movement  Abdomen: Soft/+BS, non  tender, non distended, no guarding  Extremities: No edema, No lymphangitis, No petechiae, No rashes, no synovitis  Data Reviewed: Basic Metabolic Panel:  Recent Labs Lab 10/29/15 1318 10/30/15 0433 10/31/15 0607  NA 126* 129* 132*  K 4.3 4.9 5.0  CL 88* 94* 94*  CO2 32 28 33*  GLUCOSE 124* 148* 127*  BUN CREATININE 0.87 0.76 0.72  CALCIUM 9.6 9.0 9.0   Liver Function Tests: No results for input(s): AST, ALT, ALKPHOS, BILITOT, PROT, ALBUMIN in the last 168 hours. No results for input(s): LIPASE, AMYLASE in the last 168 hours. No results for input(s): AMMONIA in the last 168 hours. CBC:  Recent Labs Lab 10/29/15 1318 10/30/15 0433 10/31/15 0607  WBC 10.7* 9.5 9.3  NEUTROABS 6.5  --   --   HGB 14.0 12.1 12.2  HCT 43.4 39.2 40.1  MCV 85.9 86.7 89.5  PLT 346 300 337   Cardiac Enzymes: No results for input(s): CKTOTAL, CKMB, CKMBINDEX, TROPONINI in the last 168 hours. BNP: Invalid input(s): POCBNP CBG: No results for input(s): GLUCAP in the last 168 hours.  Recent Results (from the past 240 hour(s))  Culture, blood (routine x 2)     Status: None (Preliminary result)   Collection Time: 10/29/15  2:40 PM  Result Value Ref Range Status   Specimen Description BLOOD LEFT HAND  Final   Special Requests BOTTLES DRAWN AEROBIC AND ANAEROBIC 5CC  Final   Culture NO GROWTH 2 DAYS  Final   Report Status PENDING  Incomplete  Culture, blood (routine x 2)     Status: None (Preliminary result)   Collection Time: 10/29/15  2:50 PM  Result Value Ref Range Status   Specimen Description BLOOD RIGHT HAND  Final   Special Requests BOTTLES DRAWN AEROBIC AND ANAEROBIC 5CC  Final   Culture NO GROWTH 2 DAYS  Final   Report Status PENDING  Incomplete     Scheduled Meds: . albuterol  2.5 mg Nebulization Q6H  . ALPRAZolam  0.25 mg Oral TID  . amLODipine  5 mg Oral Daily  . aspirin EC  81 mg Oral Daily  . azithromycin  500 mg Oral Q24H  . benzonatate  200 mg Oral TID  .  cefTRIAXone (ROCEPHIN)  IV  1 g Intravenous Q24H  . enoxaparin (LOVENOX) injection  40 mg Subcutaneous Q24H  . famotidine  20 mg Oral Daily  . levothyroxine  50 mcg Oral QAC breakfast  . olopatadine  1 drop Both Eyes BID  . propranolol ER  60 mg Oral Daily   Continuous Infusions: . sodium chloride 100 mL/hr at 10/31/15 0600     Deakin Lacek, DO  [facility] Pager [phone]  If 7PM-7AM, please contact night-coverage [email or web address] Password [email or web address] 10/31/2015, 7:14 PM   LOS: 2 days

## 2015-10-31 NOTE — Progress Notes (Signed)
O2 sat data from PT  SATURATION QUALIFICATIONS: (This note is used to comply with regulatory documentation for home oxygen)  Patient Saturations on Room Air at Rest = 93% on 2L O2  Patient Saturations on Room Air while Ambulating = 79%  Patient Saturations on 2 Liters of oxygen while Ambulating = 93%  Please briefly explain why patient needs home oxygen: Desaturation with exertion on RA.  Lyanne CoVictoria Raveena Hebdon, PT  Acute Rehab Services  [phone]

## 2015-11-01 DIAGNOSIS — E871 Hypo-osmolality and hyponatremia: Secondary | ICD-10-CM | POA: Insufficient documentation

## 2015-11-01 LAB — BASIC METABOLIC PANEL
ANION GAP: 7 (ref 5–15)
BUN: 7 mg/dL (ref 6–20)
CALCIUM: 9.4 mg/dL (ref 8.9–10.3)
CO2: 32 mmol/L (ref 22–32)
Chloride: 95 mmol/L — ABNORMAL LOW (ref 101–111)
Creatinine, Ser: 0.75 mg/dL (ref 0.44–1.00)
GFR calc Af Amer: >60 mL/min (ref >60)
GLUCOSE: 114 mg/dL — AB (ref 65–99)
Potassium: 5.1 mmol/L (ref 3.5–5.1)
Sodium: 134 mmol/L — ABNORMAL LOW (ref 135–145)

## 2015-11-01 LAB — HEMOGLOBIN A1C
HEMOGLOBIN A1C: 7.2 % — AB (ref 4.8–5.6)
MEAN PLASMA GLUCOSE: 160 mg/dL

## 2015-11-01 MED ORDER — AZITHROMYCIN 500 MG PO TABS: 500.0000 mg | ORAL_TABLET | ORAL | Status: DC

## 2015-11-01 MED ORDER — CEFDINIR 300 MG PO CAPS: 300.0000 mg | ORAL_CAPSULE | Freq: Two times a day (BID) | ORAL | Status: DC

## 2015-11-01 MED ORDER — ALBUTEROL SULFATE (2.5 MG/3ML) 0.083% IN NEBU: 2.5000 mg | INHALATION_SOLUTION | Freq: Four times a day (QID) | RESPIRATORY_TRACT | Status: DC | PRN

## 2015-11-01 NOTE — Discharge Summary (Addendum)
Physician Discharge Summary  Tammy Fernandez RUE:[id number] DOB: 29-Apr-1924 DOA: 10/29/2015  PCP: [name],[name], MD  Admit date: 10/29/2015 Discharge date: 11/01/2015  Recommendations for Outpatient Follow-up:  1. Pt will need to follow up with PCP in 2 weeks post discharge 2. Please obtain BMP in one week Discharge Diagnoses:  Acute respiratory failure with hypoxia -secondary to CAP -presently stable on 2L -wean oxygen for sat >92% -pulmonary hygiene -ambulatory pulse oximetry revealed desaturation on room air - Patient will go home on 2 L nasal cannula -suspect underlying OHS/OSA -will need outpt polysomnogram -[facility]  CAP -continue azithromycin and ceftriaxone -follow up blood cultures--neg -home with omnicef and azithromycin x 4 more days to complete 7 days of tx  Hyponatremia -secondary to volume depletion -improving with IVF -Na 134 on day of d/c  History Stroke -PT evaluation-->HHPT -continue ASA  HTN -restart lisinopril -Continue propranolol, amlodipine  Hypothyroidism -Continue levothyroxine  GERD -continue pepcid  Hyperglycemia/Diabetes mellitus type 2 -check A1c--7.2 -allow for liberal glycemic control -will not start any agents at this time  Discharge Condition: stable  Disposition: home  Diet:heart healthy Wt Readings from Last 3 Encounters:  10/29/15 86.456 kg (190 lb 9.6 oz)  03/18/14 82.101 kg (181 lb)  03/12/14 82.101 kg (181 lb)    History of present illness:  81 year old female with a history of stroke, hypertension, hyperlipidemia, GERD presented with one-week history of cough, shortness of breath. The patient's home health physical therapist noted the patient to be increasingly short of breath and found her oxygen saturations to be in the 80s. The patient was sent to urgent care. Chest x-ray suggested a right lower lobe opacity. From there, the patient was sent to the emergency department for  further evaluation. The patient denied any fevers, chills, nausea, vomiting, diarrhea, chest pain, hemoptysis. She denies any abdominal pain, hematochezia, melena. She denies any recent sick contacts. The patient was started on intravenous antibiotics. She experienced clinical improvement. The patient remained hemodynamically stable. The patient was found to be hyponatremic at the time of admission with a sodium of 126. She was started on intravenous fluids with improvement. Serum sodium 134 on the day of discharge. Physical therapy was consulted. They recommended home health physical therapy which was set up with the assistance of care management. Ambulatory pulse ox revealed oxygen desaturation. The patient will be sent home home with home oxygen.  Discharge Exam: Filed Vitals:   11/01/15 0641 11/01/15 1015  BP: 142/54 139/52  Pulse: 79 80  Temp: 98.2 F (36.8 C)   Resp: 18    Filed Vitals:   10/31/15 2140 11/01/15 0641 11/01/15 0820 11/01/15 1015  BP: 130/48 142/54  139/52  Pulse: 73 79  80  Temp: 98.2 F (36.8 C) 98.2 F (36.8 C)    TempSrc: Oral Oral    Resp: 20 18    Height:      Weight:      SpO2: 94% 100% 98%    General: A&O x 3, NAD, pleasant, cooperative Cardiovascular: RRR, no rub, no gallop, no S3 Respiratory: Bibasilar crackles. No wheezing. Good air movement Abdomen:soft, nontender, nondistended, positive bowel sounds Extremities: No edema, No lymphangitis, no petechiae  Discharge Instructions  Discharge Instructions    Diet - low sodium heart healthy    Complete by:  As directed      Increase activity slowly    Complete by:  As directed             Medication  List    STOP taking these medications        pantoprazole 40 MG tablet  Commonly known as:  PROTONIX      TAKE these medications        ALPRAZolam 0.5 MG tablet  Commonly known as:  XANAX  Take 0.25 mg by mouth 3 (three) times daily.     amLODipine 5 MG tablet  Commonly known as:  NORVASC    Take 5 mg by mouth daily.     Aspirin 81 MG EC tablet  Take 81 mg by mouth daily.     azithromycin 500 MG tablet  Commonly known as:  ZITHROMAX  Take 1 tablet (500 mg total) by mouth daily.     calcium carbonate 500 MG chewable tablet  Commonly known as:  TUMS - dosed in mg elemental calcium  Chew 1 tablet by mouth daily as needed for indigestion or heartburn.     cefdinir 300 MG capsule  Commonly known as:  OMNICEF  Take 1 capsule (300 mg total) by mouth 2 (two) times daily.     chlorpheniramine 4 MG tablet  Commonly known as:  CHLOR-TRIMETON  Take 4 mg by mouth 2 (two) times daily as needed for allergies.     levothyroxine 50 MCG tablet  Commonly known as:  SYNTHROID, LEVOTHROID  Take 1 tablet (50 mcg total) by mouth daily.     lisinopril 5 MG tablet  Commonly known as:  PRINIVIL,ZESTRIL  Take 5 mg by mouth daily. Take one tablet daily     meclizine 12.5 MG tablet  Commonly known as:  ANTIVERT  TK 1 TO 2 TS PO Q 6 H PRF DIZZINESS     PATADAY 0.2 % Soln  Generic drug:  Olopatadine HCl  Place 1 drop into both eyes daily.     olopatadine 0.1 % ophthalmic solution  Commonly known as:  PATANOL  Place 1 drop into both eyes daily as needed.     propranolol ER 60 MG 24 hr capsule  Commonly known as:  INDERAL LA  Take 1 capsule (60 mg total) by mouth daily.     ranitidine 300 MG tablet  Commonly known as:  ZANTAC  Take 300 mg by mouth at bedtime.     VITAMIN C PO  Take 1 tablet by mouth daily.         The results of significant diagnostics from this hospitalization (including imaging, microbiology, ancillary and laboratory) are listed below for reference.    Significant Diagnostic Studies: Dg Chest 2 View  10/30/2015  CLINICAL DATA:  Community acquired pneumonia EXAM: CHEST  2 VIEW COMPARISON:  10/29/2015 FINDINGS: Chronic elevation of the right hemidiaphragm. Degenerative changes bilateral shoulders. Atherosclerotic calcifications of thoracic aorta. There is  patchy right perihilar atelectasis or infiltrate. No pulmonary edema. IMPRESSION: Chronic elevation of the right hemidiaphragm. Patchy right perihilar atelectasis or infiltrate. Electronically Signed   By: Natasha Mead M.D.   On: 10/30/2015 09:09   Dg Chest 2 View  10/29/2015  CLINICAL DATA:  Cough and congestion EXAM: CHEST  2 VIEW COMPARISON:  01/12/2014 chest radiograph. FINDINGS: Stable prominent elevation of the right hemidiaphragm. Stable cardiomediastinal silhouette with normal heart size. No pneumothorax. No pleural effusion. No pulmonary edema. Patchy opacity at the right lung base is not appreciably changed. No pulmonary edema. IMPRESSION: Stable prominent chronic elevation of the right hemidiaphragm. Patchy opacity at the right lung base appears stable compared to prior chest radiographs, suggesting atelectasis, although a superimposed pneumonia  would be difficult to exclude and clinical correlation is necessary. Electronically Signed   By: Delbert Phenix M.D.   On: 10/29/2015 12:50     Microbiology: Recent Results (from the past 240 hour(s))  Culture, blood (routine x 2)     Status: None (Preliminary result)   Collection Time: 10/29/15  2:40 PM  Result Value Ref Range Status   Specimen Description BLOOD LEFT HAND  Final   Special Requests BOTTLES DRAWN AEROBIC AND ANAEROBIC 5CC  Final   Culture NO GROWTH 2 DAYS  Final   Report Status PENDING  Incomplete  Culture, blood (routine x 2)     Status: None (Preliminary result)   Collection Time: 10/29/15  2:50 PM  Result Value Ref Range Status   Specimen Description BLOOD RIGHT HAND  Final   Special Requests BOTTLES DRAWN AEROBIC AND ANAEROBIC 5CC  Final   Culture NO GROWTH 2 DAYS  Final   Report Status PENDING  Incomplete     Labs: Basic Metabolic Panel:  Recent Labs Lab 10/29/15 1318 10/30/15 0433 10/31/15 0607 11/01/15 0538  NA 126* 129* 132* 134*  K 4.3 4.9 5.0 5.1  CL 88* 94* 94* 95*  CO2 32 28 33* 32  GLUCOSE 124* 148*  127* 114*  BUN CREATININE 0.87 0.76 0.72 0.75  CALCIUM 9.6 9.0 9.0 9.4   Liver Function Tests: No results for input(s): AST, ALT, ALKPHOS, BILITOT, PROT, ALBUMIN in the last 168 hours. No results for input(s): LIPASE, AMYLASE in the last 168 hours. No results for input(s): AMMONIA in the last 168 hours. CBC:  Recent Labs Lab 10/29/15 1318 10/30/15 0433 10/31/15 0607  WBC 10.7* 9.5 9.3  NEUTROABS 6.5  --   --   HGB 14.0 12.1 12.2  HCT 43.4 39.2 40.1  MCV 85.9 86.7 89.5  PLT 346 300 337   Cardiac Enzymes: No results for input(s): CKTOTAL, CKMB, CKMBINDEX, TROPONINI in the last 168 hours. BNP: Invalid input(s): POCBNP CBG: No results for input(s): GLUCAP in the last 168 hours.  Time coordinating discharge:  Greater than 30 minutes  Signed:  Alioune Hodgkin, DO [facility] Pager: [phone] 11/01/2015, 12:35 PM

## 2015-11-01 NOTE — Care Management Note (Signed)
Case Management Note  Patient Details  Name: Tammy Fernandez MRN: [id number] Date of Birth: August 01, 1924  Subjective/Objective:         CAP          Action/Plan: NCM spoke to dtr.  Contacted [facility] for oxygen and nebulizer machine for home. [facility]   Expected Discharge Date:  11/01/2015              Expected Discharge Plan:  Home w Home Health Services  In-House Referral:     Discharge planning Services  CM Consult  Post Acute Care Choice:  Home Health Choice offered to:  Patient  DME Arranged:  Oxygen, Nebulizer machine DME Agency:     HH Arranged:  PT HH Agency:  [facility]  Status of Service:  Completed, signed off  Medicare Important Message Given:  Yes Date Medicare IM Given:    Medicare IM give by:    Date Additional Medicare IM Given:    Additional Medicare Important Message give by:     If discussed at Long Length of Stay Meetings, dates discussed:    Additional Comments:  Elliot CousinShavis, Keshon Markovitz Ellen, RN 11/01/2015, 2:34 PM

## 2015-11-01 NOTE — Care Management Important Message (Signed)
Important Message  Patient Details  Name: Tammy Fernandez MRN: [id number] Date of Birth: May 24, 1924   Medicare Important Message Given:  Yes    Kyla BalzarineShealy, Reon Hunley Abena 11/01/2015, 12:11 PM

## 2015-11-03 LAB — CULTURE, BLOOD (ROUTINE X 2)
Culture: NO GROWTH
Culture: NO GROWTH

## 2015-11-30 ENCOUNTER — Inpatient Hospital Stay (HOSPITAL_COMMUNITY)
Admission: EM | Admit: 2015-11-30 | Discharge: 2015-12-15 | DRG: 643 | Disposition: A | Discharge status: Skilled Nursing Facility | Payer: Medicare Other | Attending: Internal Medicine | Admitting: Internal Medicine

## 2015-11-30 ENCOUNTER — Emergency Department (HOSPITAL_COMMUNITY): Payer: Medicare Other

## 2015-11-30 ENCOUNTER — Encounter (HOSPITAL_COMMUNITY): Payer: Self-pay | Admitting: Emergency Medicine

## 2015-11-30 DIAGNOSIS — R319 Hematuria, unspecified: Secondary | ICD-10-CM | POA: Diagnosis present

## 2015-11-30 DIAGNOSIS — Z452 Encounter for adjustment and management of vascular access device: Secondary | ICD-10-CM

## 2015-11-30 DIAGNOSIS — Z823 Family history of stroke: Secondary | ICD-10-CM | POA: Diagnosis not present

## 2015-11-30 DIAGNOSIS — R110 Nausea: Secondary | ICD-10-CM

## 2015-11-30 DIAGNOSIS — E861 Hypovolemia: Secondary | ICD-10-CM | POA: Diagnosis present

## 2015-11-30 DIAGNOSIS — E876 Hypokalemia: Secondary | ICD-10-CM | POA: Diagnosis present

## 2015-11-30 DIAGNOSIS — I10 Essential (primary) hypertension: Secondary | ICD-10-CM | POA: Diagnosis present

## 2015-11-30 DIAGNOSIS — J9621 Acute and chronic respiratory failure with hypoxia: Secondary | ICD-10-CM | POA: Diagnosis present

## 2015-11-30 DIAGNOSIS — R0602 Shortness of breath: Secondary | ICD-10-CM

## 2015-11-30 DIAGNOSIS — F329 Major depressive disorder, single episode, unspecified: Secondary | ICD-10-CM | POA: Diagnosis present

## 2015-11-30 DIAGNOSIS — E871 Hypo-osmolality and hyponatremia: Secondary | ICD-10-CM

## 2015-11-30 DIAGNOSIS — K529 Noninfective gastroenteritis and colitis, unspecified: Secondary | ICD-10-CM | POA: Diagnosis present

## 2015-11-30 DIAGNOSIS — J189 Pneumonia, unspecified organism: Secondary | ICD-10-CM

## 2015-11-30 DIAGNOSIS — Z6834 Body mass index (BMI) 34.0-34.9, adult: Secondary | ICD-10-CM

## 2015-11-30 DIAGNOSIS — Z79899 Other long term (current) drug therapy: Secondary | ICD-10-CM

## 2015-11-30 DIAGNOSIS — N390 Urinary tract infection, site not specified: Secondary | ICD-10-CM | POA: Diagnosis present

## 2015-11-30 DIAGNOSIS — E785 Hyperlipidemia, unspecified: Secondary | ICD-10-CM | POA: Diagnosis present

## 2015-11-30 DIAGNOSIS — D72829 Elevated white blood cell count, unspecified: Secondary | ICD-10-CM | POA: Diagnosis not present

## 2015-11-30 DIAGNOSIS — E038 Other specified hypothyroidism: Secondary | ICD-10-CM | POA: Diagnosis not present

## 2015-11-30 DIAGNOSIS — Z882 Allergy status to sulfonamides status: Secondary | ICD-10-CM | POA: Diagnosis not present

## 2015-11-30 DIAGNOSIS — E039 Hypothyroidism, unspecified: Secondary | ICD-10-CM | POA: Diagnosis present

## 2015-11-30 DIAGNOSIS — Z888 Allergy status to other drugs, medicaments and biological substances status: Secondary | ICD-10-CM | POA: Diagnosis not present

## 2015-11-30 DIAGNOSIS — Y95 Nosocomial condition: Secondary | ICD-10-CM | POA: Diagnosis not present

## 2015-11-30 DIAGNOSIS — F419 Anxiety disorder, unspecified: Secondary | ICD-10-CM | POA: Diagnosis present

## 2015-11-30 DIAGNOSIS — Z7982 Long term (current) use of aspirin: Secondary | ICD-10-CM | POA: Diagnosis not present

## 2015-11-30 DIAGNOSIS — R5381 Other malaise: Secondary | ICD-10-CM | POA: Diagnosis not present

## 2015-11-30 DIAGNOSIS — F411 Generalized anxiety disorder: Secondary | ICD-10-CM | POA: Diagnosis present

## 2015-11-30 DIAGNOSIS — R4182 Altered mental status, unspecified: Secondary | ICD-10-CM | POA: Diagnosis present

## 2015-11-30 DIAGNOSIS — E875 Hyperkalemia: Secondary | ICD-10-CM | POA: Diagnosis present

## 2015-11-30 DIAGNOSIS — R410 Disorientation, unspecified: Secondary | ICD-10-CM | POA: Diagnosis not present

## 2015-11-30 DIAGNOSIS — Z789 Other specified health status: Secondary | ICD-10-CM | POA: Diagnosis not present

## 2015-11-30 DIAGNOSIS — G9340 Encephalopathy, unspecified: Secondary | ICD-10-CM | POA: Diagnosis not present

## 2015-11-30 DIAGNOSIS — Z7401 Bed confinement status: Secondary | ICD-10-CM

## 2015-11-30 DIAGNOSIS — G20 Parkinson's disease: Secondary | ICD-10-CM | POA: Diagnosis present

## 2015-11-30 DIAGNOSIS — K760 Fatty (change of) liver, not elsewhere classified: Secondary | ICD-10-CM | POA: Diagnosis present

## 2015-11-30 DIAGNOSIS — K219 Gastro-esophageal reflux disease without esophagitis: Secondary | ICD-10-CM | POA: Diagnosis present

## 2015-11-30 DIAGNOSIS — Z91018 Allergy to other foods: Secondary | ICD-10-CM | POA: Diagnosis not present

## 2015-11-30 DIAGNOSIS — G9341 Metabolic encephalopathy: Secondary | ICD-10-CM | POA: Diagnosis present

## 2015-11-30 DIAGNOSIS — E222 Syndrome of inappropriate secretion of antidiuretic hormone: Principal | ICD-10-CM | POA: Diagnosis present

## 2015-11-30 DIAGNOSIS — J9622 Acute and chronic respiratory failure with hypercapnia: Secondary | ICD-10-CM | POA: Diagnosis present

## 2015-11-30 DIAGNOSIS — R400 Somnolence: Secondary | ICD-10-CM | POA: Diagnosis not present

## 2015-11-30 DIAGNOSIS — I69351 Hemiplegia and hemiparesis following cerebral infarction affecting right dominant side: Secondary | ICD-10-CM | POA: Diagnosis not present

## 2015-11-30 DIAGNOSIS — Z9981 Dependence on supplemental oxygen: Secondary | ICD-10-CM

## 2015-11-30 DIAGNOSIS — Z90710 Acquired absence of both cervix and uterus: Secondary | ICD-10-CM | POA: Diagnosis not present

## 2015-11-30 DIAGNOSIS — I639 Cerebral infarction, unspecified: Secondary | ICD-10-CM

## 2015-11-30 DIAGNOSIS — B961 Klebsiella pneumoniae [K. pneumoniae] as the cause of diseases classified elsewhere: Secondary | ICD-10-CM | POA: Diagnosis present

## 2015-11-30 LAB — CBC WITH DIFFERENTIAL/PLATELET
Basophils Absolute: 0 K/uL (ref 0.0–0.1)
Basophils Relative: 0 %
Eosinophils Absolute: 0 K/uL (ref 0.0–0.7)
Eosinophils Relative: 0 %
HCT: 33.6 % — ABNORMAL LOW (ref 36.0–46.0)
Hemoglobin: 11.2 g/dL — ABNORMAL LOW (ref 12.0–15.0)
Lymphocytes Relative: 21 %
Lymphs Abs: 2.1 K/uL (ref 0.7–4.0)
MCH: 27.5 pg (ref 26.0–34.0)
MCHC: 33.3 g/dL (ref 30.0–36.0)
MCV: 82.4 fL (ref 78.0–100.0)
Monocytes Absolute: 0.5 K/uL (ref 0.1–1.0)
Monocytes Relative: 5 %
Neutro Abs: 7.7 K/uL (ref 1.7–7.7)
Neutrophils Relative %: 74 %
Platelets: 236 K/uL (ref 150–400)
RBC: 4.08 MIL/uL (ref 3.87–5.11)
RDW: 13.4 % (ref 11.5–15.5)
WBC: 10.3 K/uL (ref 4.0–10.5)

## 2015-11-30 LAB — COMPREHENSIVE METABOLIC PANEL
ALK PHOS: 87 U/L (ref 38–126)
ALT: 15 U/L (ref 14–54)
ANION GAP: 11 (ref 5–15)
AST: 46 U/L — ABNORMAL HIGH (ref 15–41)
Albumin: 3.4 g/dL — ABNORMAL LOW (ref 3.5–5.0)
BILIRUBIN TOTAL: 1.3 mg/dL — AB (ref 0.3–1.2)
BUN: 15 mg/dL (ref 6–20)
CALCIUM: 9.6 mg/dL (ref 8.9–10.3)
CO2: 29 mmol/L (ref 22–32)
CREATININE: 0.63 mg/dL (ref 0.44–1.00)
Chloride: 72 mmol/L — ABNORMAL LOW (ref 101–111)
Glucose, Bld: 87 mg/dL (ref 65–99)
Potassium: 6.6 mmol/L (ref 3.5–5.1)
Sodium: 112 mmol/L — CL (ref 135–145)
TOTAL PROTEIN: 7.3 g/dL (ref 6.5–8.1)

## 2015-11-30 LAB — I-STAT CHEM 8, ED
BUN: 21 mg/dL — ABNORMAL HIGH (ref 6–20)
BUN: 17 mg/dL (ref 6–20)
CHLORIDE: 72 mmol/L — AB (ref 101–111)
CHLORIDE: 70 mmol/L — AB (ref 101–111)
CREATININE: 0.9 mg/dL (ref 0.44–1.00)
Calcium, Ion: 1.03 mmol/L — ABNORMAL LOW (ref 1.13–1.30)
Calcium, Ion: 1.12 mmol/L — ABNORMAL LOW (ref 1.13–1.30)
Creatinine, Ser: 0.8 mg/dL (ref 0.44–1.00)
GLUCOSE: 101 mg/dL — AB (ref 65–99)
GLUCOSE: 99 mg/dL (ref 65–99)
HCT: 44 % (ref 36.0–46.0)
HEMATOCRIT: 46 % (ref 36.0–46.0)
HEMOGLOBIN: 15.6 g/dL — AB (ref 12.0–15.0)
HEMOGLOBIN: 15 g/dL (ref 12.0–15.0)
POTASSIUM: 6.3 mmol/L — AB (ref 3.5–5.1)
POTASSIUM: 5.4 mmol/L — AB (ref 3.5–5.1)
SODIUM: 110 mmol/L — AB (ref 135–145)
Sodium: 111 mmol/L — CL (ref 135–145)
TCO2: 36 mmol/L (ref 0–100)
TCO2: 36 mmol/L (ref 0–100)

## 2015-11-30 LAB — BASIC METABOLIC PANEL WITH GFR
Anion gap: 8 (ref 5–15)
BUN: 14 mg/dL (ref 6–20)
CO2: 32 mmol/L (ref 22–32)
Calcium: 9.7 mg/dL (ref 8.9–10.3)
Chloride: 71 mmol/L — ABNORMAL LOW (ref 101–111)
Creatinine, Ser: 0.68 mg/dL (ref 0.44–1.00)
GFR calc Af Amer: >60 mL/min
GFR calc non Af Amer: >60 mL/min
Glucose, Bld: 101 mg/dL — ABNORMAL HIGH (ref 65–99)
Potassium: 6.1 mmol/L (ref 3.5–5.1)
Sodium: 111 mmol/L — CL (ref 135–145)

## 2015-11-30 LAB — PROTIME-INR
INR: 1.03 (ref 0.00–1.49)
Prothrombin Time: 13.7 s (ref 11.6–15.2)

## 2015-11-30 LAB — NA AND K (SODIUM & POTASSIUM), RAND UR
Potassium Urine: 24 mmol/L
Sodium, Ur: <10 mmol/L

## 2015-11-30 LAB — URINE MICROSCOPIC-ADD ON

## 2015-11-30 LAB — URINALYSIS, ROUTINE W REFLEX MICROSCOPIC
Bilirubin Urine: NEGATIVE
Glucose, UA: NEGATIVE mg/dL
Ketones, ur: NEGATIVE mg/dL
Leukocytes, UA: NEGATIVE
Nitrite: NEGATIVE
Protein, ur: 100 mg/dL — AB
Specific Gravity, Urine: 1.015 (ref 1.005–1.030)
pH: 6 (ref 5.0–8.0)

## 2015-11-30 LAB — I-STAT CG4 LACTIC ACID, ED
LACTIC ACID, VENOUS: 1.11 mmol/L (ref 0.5–2.0)
Lactic Acid, Venous: 1.64 mmol/L (ref 0.5–2.0)

## 2015-11-30 LAB — OSMOLALITY: OSMOLALITY: 238 mosm/kg — AB (ref 275–295)

## 2015-11-30 LAB — AMMONIA: AMMONIA: 59 umol/L — AB (ref 9–35)

## 2015-11-30 LAB — TROPONIN I: Troponin I: <0.03 ng/mL

## 2015-11-30 LAB — APTT: aPTT: 34 s (ref 24–37)

## 2015-11-30 MED ORDER — SODIUM CHLORIDE 0.9 % IV SOLN
Freq: Once | INTRAVENOUS | Status: AC
  Administered 2015-11-30: 19:00:00 via INTRAVENOUS

## 2015-11-30 MED ORDER — SODIUM CHLORIDE 0.9 % IV BOLUS (SEPSIS)
500.0000 mL | Freq: Once | INTRAVENOUS | Status: AC
  Administered 2015-11-30: 500 mL via INTRAVENOUS

## 2015-11-30 MED ORDER — PANTOPRAZOLE SODIUM 40 MG PO TBEC
40.0000 mg | DELAYED_RELEASE_TABLET | Freq: Every day | ORAL | Status: DC
  Administered 2015-12-01 – 2015-12-15 (×12): 40 mg via ORAL
  Filled 2015-11-30 (×12): qty 1

## 2015-11-30 MED ORDER — ASPIRIN EC 81 MG PO TBEC
81.0000 mg | DELAYED_RELEASE_TABLET | Freq: Every day | ORAL | Status: DC
  Administered 2015-12-01 – 2015-12-15 (×12): 81 mg via ORAL
  Filled 2015-11-30 (×12): qty 1

## 2015-11-30 MED ORDER — SODIUM CHLORIDE 0.9 % IV BOLUS (SEPSIS): 1000.0000 mL | Freq: Once | INTRAVENOUS | Status: DC

## 2015-11-30 MED ORDER — LEVOTHYROXINE SODIUM 50 MCG PO TABS
50.0000 ug | ORAL_TABLET | Freq: Every day | ORAL | Status: DC
  Administered 2015-12-01 – 2015-12-07 (×6): 50 ug via ORAL
  Filled 2015-11-30 (×7): qty 1

## 2015-11-30 MED ORDER — AMLODIPINE BESYLATE 5 MG PO TABS
5.0000 mg | ORAL_TABLET | Freq: Every day | ORAL | Status: DC
  Administered 2015-12-02: 5 mg via ORAL
  Filled 2015-11-30 (×2): qty 1

## 2015-11-30 MED ORDER — ALBUTEROL SULFATE (2.5 MG/3ML) 0.083% IN NEBU: 2.5000 mg | INHALATION_SOLUTION | Freq: Four times a day (QID) | RESPIRATORY_TRACT | Status: DC | PRN

## 2015-11-30 MED ORDER — SODIUM CHLORIDE 0.9 % IV SOLN
INTRAVENOUS | Status: DC
  Administered 2015-11-30 – 2015-12-08 (×4): via INTRAVENOUS

## 2015-11-30 MED ORDER — LISINOPRIL 5 MG PO TABS
5.0000 mg | ORAL_TABLET | Freq: Every day | ORAL | Status: DC
  Administered 2015-12-01 – 2015-12-09 (×8): 5 mg via ORAL
  Filled 2015-11-30 (×8): qty 1

## 2015-11-30 MED ORDER — PROPRANOLOL HCL ER 60 MG PO CP24
60.0000 mg | ORAL_CAPSULE | Freq: Every day | ORAL | Status: DC
  Administered 2015-12-01 – 2015-12-09 (×8): 60 mg via ORAL
  Filled 2015-11-30 (×9): qty 1

## 2015-11-30 MED ORDER — SODIUM CHLORIDE 0.9 % IV SOLN
1.0000 g | Freq: Once | INTRAVENOUS | Status: AC
  Administered 2015-11-30: 1 g via INTRAVENOUS
  Filled 2015-11-30: qty 10

## 2015-11-30 MED ORDER — FAMOTIDINE 20 MG PO TABS
20.0000 mg | ORAL_TABLET | Freq: Every day | ORAL | Status: DC
Start: 2015-11-30 — End: 2015-12-15
  Administered 2015-12-01 – 2015-12-15 (×12): 20 mg via ORAL
  Filled 2015-11-30 (×12): qty 1

## 2015-11-30 MED ORDER — SODIUM CHLORIDE 0.9 % IV SOLN: 250.0000 mL | INTRAVENOUS | Status: DC | PRN

## 2015-11-30 MED ORDER — HEPARIN SODIUM (PORCINE) 5000 UNIT/ML IJ SOLN
5000.0000 [IU] | Freq: Three times a day (TID) | INTRAMUSCULAR | Status: DC
  Administered 2015-11-30 – 2015-12-13 (×38): 5000 [IU] via SUBCUTANEOUS
  Filled 2015-11-30 (×35): qty 1

## 2015-11-30 NOTE — ED Notes (Signed)
Pt in radiology 

## 2015-11-30 NOTE — ED Notes (Signed)
[facility]

## 2015-11-30 NOTE — ED Notes (Signed)
Awaiting critical care to see pt.

## 2015-11-30 NOTE — ED Notes (Signed)
Notified PA of critical labs NA 111 and  K 6.1

## 2015-11-30 NOTE — ED Provider Notes (Signed)
CSN: [id number]     Arrival date & time 11/30/15  1403 History   First MD Initiated Contact with Patient 11/30/15 1409     Chief Complaint  Patient presents with  . Stroke Symptoms     (Consider location/radiation/quality/duration/timing/severity/associated sxs/prior Treatment) HPI   Tammy Fernandez is a 81 y.o F with a pmhx of HTN, HLD, CVA 12/2014 with residual right sided weakness who presents to the ED today with AMS. Pts son is providing history. Level V caveat, AMS. Pt son states that over the last 2 weeks pt has had intermittent confusion, diarrhea and "shaking". Today however patient son states that patient was not following commands and was verbally unresponsive, but was conscious. Patient's home health nurse checked her stool today for C. difficile which was negative. No reported fevers, chills, vomiting, abdominal pain, chest pain, shortness of breath. Patient is on home 2 L of O2. In ED pt is oriented to self, but not to location. Unable to give detailed history. Pt is able to follow commands.  Past Medical History  Diagnosis Date  . Hyperlipidemia   . Hypertension   . Anxiety   . GERD (gastroesophageal reflux disease)   . Abnormal heart rhythms   . Fatty liver   . Internal hemorrhoid    Past Surgical History  Procedure Laterality Date  . Cesarean section    . Abdominal hysterectomy    . Tee without cardioversion N/A 01/19/2014    Procedure: TRANSESOPHAGEAL ECHOCARDIOGRAM (TEE);  Surgeon: Laurey Morale, MD;  Location[facility] ENDOSCOPY;  Service: Cardiovascular;  Laterality: N/A;  [name]   Family History  Problem Relation Age of Onset  . Stroke Father   . Hypertension Father   . Colon cancer Cousin   . Colon cancer      Aunt  . Diabetes      aunt and cousins  . Heart disease      cousins   Social History  Substance Use Topics  . Smoking status: Never Smoker   . Smokeless tobacco: Never Used  . Alcohol Use: No   OB History    No data available     Review of  Systems  Unable to perform ROS: Mental status change      Allergies  Apple flavor; Peanut butter flavor; Cephalexin; Crestor; and Sulfa drugs cross reactors  Home Medications   Prior to Admission medications   Medication Sig Start Date End Date Taking? Authorizing Provider  albuterol (PROVENTIL) (2.5 MG/3ML) 0.083% nebulizer solution Take 3 mLs (2.5 mg total) by nebulization every 6 (six) hours as needed for wheezing or shortness of breath. 11/01/15   Catarina Hartshorn, MD  ALPRAZolam Prudy Feeler) 0.5 MG tablet Take 0.25 mg by mouth 3 (three) times daily.  10/17/15   Historical Provider, MD  amLODipine (NORVASC) 5 MG tablet Take 5 mg by mouth daily. 08/06/15   Historical Provider, MD  Ascorbic Acid (VITAMIN C PO) Take 1 tablet by mouth daily.    Historical Provider, MD  Aspirin 81 MG EC tablet Take 81 mg by mouth daily.    Historical Provider, MD  azithromycin (ZITHROMAX) 500 MG tablet Take 1 tablet (500 mg total) by mouth daily. 11/01/15   Catarina Hartshorn, MD  calcium carbonate (TUMS - DOSED IN MG ELEMENTAL CALCIUM) 500 MG chewable tablet Chew 1 tablet by mouth daily as needed for indigestion or heartburn.    Historical Provider, MD  cefdinir (OMNICEF) 300 MG capsule Take 1 capsule (300 mg total) by mouth 2 (two)  times daily. 11/01/15   Catarina Hartshornavid Tat, MD  chlorpheniramine (CHLOR-TRIMETON) 4 MG tablet Take 4 mg by mouth 2 (two) times daily as needed for allergies.    Historical Provider, MD  levothyroxine (SYNTHROID, LEVOTHROID) 50 MCG tablet Take 1 tablet (50 mcg total) by mouth daily. 01/29/14   Mcarthur Rossettianiel J Angiulli, PA-C  lisinopril (PRINIVIL,ZESTRIL) 5 MG tablet Take 5 mg by mouth daily. Take one tablet daily    Historical Provider, MD  meclizine (ANTIVERT) 12.5 MG tablet TK 1 TO 2 TS PO Q 6 H PRF DIZZINESS 10/24/15   Historical Provider, MD  olopatadine (PATANOL) 0.1 % ophthalmic solution Place 1 drop into both eyes daily as needed. 10/12/15   Historical Provider, MD  PATADAY 0.2 % SOLN Place 1 drop into both eyes  daily. 10/09/15   Historical Provider, MD  propranolol ER (INDERAL LA) 60 MG 24 hr capsule Take 1 capsule (60 mg total) by mouth daily. 01/29/14   Mcarthur Rossettianiel J Angiulli, PA-C  ranitidine (ZANTAC) 300 MG tablet Take 300 mg by mouth at bedtime. 10/17/15   Historical Provider, MD   BP 156/135 mmHg  Temp(Src) 98.1 F (36.7 C) (Oral)  Resp 25  Ht 5\' 2"  (1.575 m)  Wt 85.276 kg  BMI 34.38 kg/m2  SpO2 96% Physical Exam  Constitutional: She is oriented to person, place, and time. She appears well-developed and well-nourished. No distress.  Ill-appearing female lying in bed.  HENT:  Head: Normocephalic and atraumatic.  Mouth/Throat: No oropharyngeal exudate.  Eyes: Conjunctivae and EOM are normal. Pupils are equal, round, and reactive to light. Right eye exhibits no discharge. Left eye exhibits no discharge. No scleral icterus.  Neck: Normal range of motion. Neck supple.  No meningismus.  Cardiovascular: Normal rate, regular rhythm, normal heart sounds and intact distal pulses.  Exam reveals no gallop and no friction rub.   No murmur heard. Pulmonary/Chest: Effort normal and breath sounds normal. No respiratory distress. She has no wheezes. She has no rales. She exhibits no tenderness.  Abdominal: Soft. She exhibits no distension. There is no tenderness. There is no guarding.  Musculoskeletal: Normal range of motion. She exhibits no edema.  Lymphadenopathy:    She has no cervical adenopathy.  Neurological: She is alert and oriented to person, place, and time. She displays normal reflexes. No cranial nerve deficit. She exhibits normal muscle tone. Coordination abnormal.  Residual R sided weakness from previous stroke. Strength 5/5 in LUE and LLE. No sensory deficits. Cranial nerves 3-12 grossly intact. Pt unable to perform finger to nose, pass point present on exam.  Pt oriented to self, but not location or year. Unable to give history. Pt did not respond when asked open ended questions. Can respond to  yes or no questions.  Skin: Skin is warm and dry. No rash noted. She is not diaphoretic. No erythema. No pallor.  Psychiatric: She has a normal mood and affect. Her behavior is normal.  Nursing note and vitals reviewed.   ED Course  Procedures (including critical care time)  CRITICAL CARE Performed by: Dub MikesSamantha Tripp Dowless   Total critical care time: 45 minutes  Critical care time was exclusive of separately billable procedures and treating other patients.  Critical care was necessary to treat or prevent imminent or life-threatening deterioration.  Critical care was time spent personally by me on the following activities: development of treatment plan with patient and/or surrogate as well as nursing, discussions with consultants, evaluation of patient's response to treatment, examination of patient, obtaining history  from patient or surrogate, ordering and performing treatments and interventions, ordering and review of laboratory studies, ordering and review of radiographic studies, pulse oximetry and re-evaluation of patient's condition.  Labs Review Labs Reviewed  URINALYSIS, ROUTINE W REFLEX MICROSCOPIC (NOT AT [facility]) - Abnormal; Notable for the following:    Hgb urine dipstick MODERATE (*)    Protein, ur 100 (*)    All other components within normal limits  BASIC METABOLIC PANEL - Abnormal; Notable for the following:    Sodium 111 (*)    Potassium 6.1 (*)    Chloride 71 (*)    Glucose, Bld 101 (*)    All other components within normal limits  CBC WITH DIFFERENTIAL/PLATELET - Abnormal; Notable for the following:    Hemoglobin 11.2 (*)    HCT 33.6 (*)    All other components within normal limits  AMMONIA - Abnormal; Notable for the following:    Ammonia 59 (*)    All other components within normal limits  URINE MICROSCOPIC-ADD ON - Abnormal; Notable for the following:    Squamous Epithelial / LPF 0-5 (*)    Bacteria, UA RARE (*)    Casts HYALINE CASTS (*)    All other  components within normal limits  PROTIME-INR  APTT  TROPONIN I  NA AND K (SODIUM & POTASSIUM), RAND UR  COMPREHENSIVE METABOLIC PANEL  I-STAT CG4 LACTIC ACID, ED  I-STAT CG4 LACTIC ACID, ED    Imaging Review Ct Head Wo Contrast  11/30/2015  CLINICAL DATA:  Unresponsive, dizziness, treatment for UTI, history of TIA EXAM: CT HEAD WITHOUT CONTRAST TECHNIQUE: Contiguous axial images were obtained from the base of the skull through the vertex without intravenous contrast. COMPARISON:  MR brain dated 01/14/2014 FINDINGS: No evidence of parenchymal hemorrhage or extra-axial fluid collection. No mass lesion, mass effect, or midline shift. No CT evidence of acute infarction. Subcortical white matter and periventricular small vessel ischemic changes. Intracranial atherosclerosis. The visualized paranasal sinuses are essentially clear. The mastoid air cells are unopacified. No fracture or dislocation is seen. IMPRESSION: No evidence of acute intracranial abnormality. Small vessel ischemic changes. Electronically Signed   By: Charline Bills M.D.   On: 11/30/2015 14:56   I have personally reviewed and evaluated these images and lab results as part of my medical decision-making.   EKG Interpretation None      MDM   Final diagnoses:  Hyponatremia  Altered mental status, unspecified altered mental status type    81 y.o F with pmhx of CVA presents with progressively worsening altered mental status over the last 2 weeks. She has had associated diarrhea, with a negative C.diff study. No associated fever. In ED pt is alert, however disoriented to location and unable to answer questions appropriately. VSS. Pass point present on cerebellar neuro exam. Pt now requiring 4L O2 which is increased from her 2L home O2. No respiratory distressed noted. No accessory muscle usage or wheezing on lung auscultation. Will work up pt for AMS including CT head, blood work with ammonia level.   Critical Na value  reported to me at 111. K is 6.1. No EKG changes will not treat potassuim level. Suspect hyponatremia is etiology of symptoms. Likely due to hypovolemia with recent diarrhea. Will give bolus of NS and recheck Na. Will titrate slowly to avoid CPDM. Urine sodium and potassium collected. CT head negative.   CXR reveals atelectasis with possible early infiltrate. Troponin wnl. Lactic acid wnl. UA negative for infection.  Spoke with critical  care physician who states that pt may be appropriate for step down. Will consult hospitalist.  Spoke with hospitalist who states that this pt is much more appropriate for ICU.  Critical care re-consulted who is willing to accept pt to their service. Will place pt on q1hr neuro checks and q2hr Na checks.   Pt signed out to Dr. Crista Curb pending critical care consultation in the ED.    Dub Mikes, PA-C 11/30/15 1732  Lester Kinsman [place], PA-C 12/01/15 1034  Lyndal Pulley, MD 12/02/15 [phone]

## 2015-11-30 NOTE — ED Notes (Signed)
Frutoso Schatzesai Pa with critical care at bedside for eval.

## 2015-11-30 NOTE — ED Notes (Signed)
Attempted report 

## 2015-11-30 NOTE — H&P (Signed)
Name: Tammy Fernandez Cowens MRN: [id number] DOB: Nov 03, 1924    ADMISSION DATE:  11/30/2015 CONSULTATION DATE:  11/30/15  REFERRING MD :  EDP  CHIEF COMPLAINT:  AMS  BRIEF PATIENT DESCRIPTION: 81 y.o. female  [facility] with AMS.  Found to be hyponatremic with Na 111 (she has had vomiting and diarrhea for almost a month).  PCCM called for admission.  SIGNIFICANT EVENTS  12/28 > admitted with AMS presumed due to hyponatremia.  STUDIES:  CT head 12/28 > no acute process. CXR 12/28 > right basilar atx.   HISTORY OF PRESENT ILLNESS:  Tammy Fernandez Nall is a 81 y.o. female with a PMH as outlined below.  She [facility] due to being altered and less interactive compared to usual.  Per her son, she has apparently been having vomiting and diarrhea which has intermittently been happening since around thanksgiving (home health RN apparently tested her for C.diff which was negative).  Since christmas, she began to have tremor like movements as well and felt as if she was dizzy whenever she would ambulate. Over the past few days, her confusion had worsened and she has been feeling weaker.  This prompted son to bring her to ED for further evaluation.  In ED, she was found to be hyponatremic with Na of 111.  CT of the head was negative for acute process.  TRH was called for admission but [facility]   PAST MEDICAL HISTORY :   has a past medical history of Hyperlipidemia; Hypertension; Anxiety; GERD (gastroesophageal reflux disease); Abnormal heart rhythms; Fatty liver; and Internal hemorrhoid.  has past surgical history that includes Cesarean section; Abdominal hysterectomy; and TEE without cardioversion (N/A, 01/19/2014). Prior to Admission medications   Medication Sig Start Date End Date Taking? Authorizing Provider  albuterol (PROVENTIL) (2.5 MG/3ML) 0.083% nebulizer solution Take 3 mLs (2.5 mg total) by nebulization every 6 (six) hours as needed for  wheezing or shortness of breath. 11/01/15  Yes Catarina Hartshornavid Tat, MD  ALPRAZolam Prudy Feeler(XANAX) 0.5 MG tablet Take 0.25 mg by mouth 3 (three) times daily.  10/17/15  Yes Historical Provider, MD  amLODipine (NORVASC) 5 MG tablet Take 5 mg by mouth daily. 08/06/15  Yes Historical Provider, MD  Ascorbic Acid (VITAMIN C PO) Take 1 tablet by mouth daily.   Yes Historical Provider, MD  Aspirin 81 MG EC tablet Take 81 mg by mouth daily.   Yes Historical Provider, MD  calcium carbonate (TUMS - DOSED IN MG ELEMENTAL CALCIUM) 500 MG chewable tablet Chew 1 tablet by mouth daily as needed for indigestion or heartburn.   Yes Historical Provider, MD  cefdinir (OMNICEF) 300 MG capsule Take 1 capsule (300 mg total) by mouth 2 (two) times daily. 11/01/15  Yes Catarina Hartshornavid Tat, MD  chlorpheniramine (CHLOR-TRIMETON) 4 MG tablet Take 4 mg by mouth 2 (two) times daily as needed for allergies.   Yes Historical Provider, MD  fluconazole (DIFLUCAN) 150 MG tablet Take 150 mg by mouth once as needed. Yeast infection 11/18/15  Yes Historical Provider, MD  levothyroxine (SYNTHROID, LEVOTHROID) 50 MCG tablet Take 1 tablet (50 mcg total) by mouth daily. 01/29/14  Yes Daniel J Angiulli, PA-C  lisinopril (PRINIVIL,ZESTRIL) 5 MG tablet Take 5 mg by mouth daily. Take one tablet daily   Yes Historical Provider, MD  meclizine (ANTIVERT) 12.5 MG tablet TAKE 1-2 TABS BY MOUTH EVERY 6 HOURS AS NEEDED FOR DIZZINESS 10/24/15  Yes Historical Provider, MD  olopatadine (PATANOL) 0.1 % ophthalmic solution Place 1 drop into both eyes daily as needed for allergies.  10/12/15  Yes Historical Provider, MD  pantoprazole (PROTONIX) 40 MG tablet Take 40 mg by mouth daily. 11/10/15  Yes Historical Provider, MD  PATADAY 0.2 % SOLN Place 1 drop into both eyes daily. 10/09/15  Yes Historical Provider, MD  propranolol ER (INDERAL LA) 60 MG 24 hr capsule Take 1 capsule (60 mg total) by mouth daily. 01/29/14  Yes Daniel J Angiulli, PA-C  ranitidine (ZANTAC) 300 MG tablet Take 300 mg by  mouth at bedtime. 10/17/15  Yes Historical Provider, MD  azithromycin (ZITHROMAX) 500 MG tablet Take 1 tablet (500 mg total) by mouth daily. Patient not taking: Reported on 11/30/2015 11/01/15   Catarina Hartshorn, MD   Allergies  Allergen Reactions  . Apple Flavor Hives    No apple sauce  . Peanut Butter Flavor Hives  . Cephalexin Other (See Comments)    dizziness  . Crestor [Rosuvastatin Calcium]   . Sulfa Drugs Cross Reactors Rash    FAMILY HISTORY:  family history includes Colon cancer in her cousin; Hypertension in her father; Stroke in her father. SOCIAL HISTORY:  reports that she has never smoked. She has never used smokeless tobacco. She reports that she does not drink alcohol or use illicit drugs.  REVIEW OF SYSTEMS:   All negative; except for those that are bolded, which indicate positives.  Constitutional: weight loss, weight gain, night sweats, fevers, chills, fatigue, weakness.  HEENT: headaches, sore throat, sneezing, nasal congestion, post nasal drip, difficulty swallowing, tooth/dental problems, visual complaints, visual changes, ear aches. Neuro: difficulty with speech, weakness, numbness, ataxia, confusion. CV:  chest pain, orthopnea, PND, swelling in lower extremities, dizziness, palpitations, syncope.  Resp: cough, hemoptysis, dyspnea, wheezing. GI  heartburn, indigestion, abdominal pain, nausea, vomiting, diarrhea, constipation, change in bowel habits, loss of appetite, hematemesis, melena, hematochezia.  GU: dysuria, change in color of urine, urgency or frequency, flank pain, hematuria. MSK: joint pain or swelling, decreased range of motion. Psych: change in mood or affect, depression, anxiety, suicidal ideations, homicidal ideations. Skin: rash, itching, bruising.    SUBJECTIVE:  Son reports pt appears to be coming around gradually.  She is able to tell me where she is, what year it is, and who president is.  VITAL SIGNS: Temp:  [98.1 F (36.7 C)] 98.1 F (36.7  C) (12/28 1414) Pulse Rate:  [73-83] 83 (12/28 2015) Resp:  [15-25] 21 (12/28 2015) BP: (142-190)/(50-135) 142/50 mmHg (12/28 2015) SpO2:  [89 %-98 %] 98 % (12/28 2015) Weight:  [85.276 kg (188 lb)] 85.276 kg (188 lb) (12/28 1414)  PHYSICAL EXAMINATION: General: Adult female, resting in bed, in NAD. Neuro: Awake, somewhat confused and says random things; however, is able to answer basic questions appropriately. HEENT: Dunn/AT. PERRL, sclerae anicteric. Cardiovascular: RRR, no M/R/G.  Lungs: Respirations even and unlabored.  CTA bilaterally, No W/R/R. Abdomen: BS x 4, soft, NT/ND.  Musculoskeletal: No gross deformities, no edema.  Skin: Intact, warm, no rashes.     Recent Labs Lab 11/30/15 1420 11/30/15 1800 11/30/15 1815 11/30/15 1941  NA 111* 110* 112* 111*  K 6.1* 6.3* 6.6* 5.4*  CL 71* 72* 72* 70*  CO2 32  --  29  --   BUN 14 21* 15 17  CREATININE 0.68 0.80 0.63 0.90  GLUCOSE 101* 101* 87 99    Recent Labs Lab 11/30/15 1420 11/30/15 1800 11/30/15 1941  HGB 11.2* 15.6* 15.0  HCT 33.6* 46.0  44.0  WBC 10.3  --   --   PLT 236  --   --    Dg Chest 2 View  11/30/2015  CLINICAL DATA:  Shortness of breath for 1 day EXAM: CHEST - 2 VIEW COMPARISON:  10/30/2015 FINDINGS: Cardiac shadow is stable. Elevation the right hemidiaphragm is again seen. Some right basilar atelectasis/ early infiltrate is noted. The left lung remains clear. No bony abnormality is noted. IMPRESSION: Right basilar changes as described. Electronically Signed   By: Alcide Clever M.D.   On: 11/30/2015 15:49   Ct Head Wo Contrast  11/30/2015  CLINICAL DATA:  Unresponsive, dizziness, treatment for UTI, history of TIA EXAM: CT HEAD WITHOUT CONTRAST TECHNIQUE: Contiguous axial images were obtained from the base of the skull through the vertex without intravenous contrast. COMPARISON:  MR brain dated 01/14/2014 FINDINGS: No evidence of parenchymal hemorrhage or extra-axial fluid collection. No mass lesion, mass  effect, or midline shift. No CT evidence of acute infarction. Subcortical white matter and periventricular small vessel ischemic changes. Intracranial atherosclerosis. The visualized paranasal sinuses are essentially clear. The mastoid air cells are unopacified. No fracture or dislocation is seen. IMPRESSION: No evidence of acute intracranial abnormality. Small vessel ischemic changes. Electronically Signed   By: Charline Bills M.D.   On: 11/30/2015 14:56    ASSESSMENT / PLAN:  Hyponatremia - likely due to volume depletion from her vomiting and diarrhea on and off x 1 month.  ? SIADH given her hx hyponatremia. Hyperkalemia - resolved. Hypocalcemia. Plan: NS @ 100 for gradual correction of Na (goal ~ 120 over next 24 hours). Assess serum osmoles, urine osmoles, TSH. 1g Ca gluconate. Repeat BMP tonight and in AM.  AMS - due to above. Plan: As above.  Acute on chronic hypoxic respiratory failure - she was admitted in Nov 2016 for CAP and has required supplemental O2 since then. Plan: Continue supplemental O2 as needed to maintain SpO2 > 92%. Albuterol PRN.  Hx HTN, HLD. Plan: Continue lisinopril, propranolol, amlodipine, ASA.  Hypothyroidism. Plan: Continue synthroid. Assess TSH.  GERD. Plan: Continue PPI, H2 blocker.  Diarrhea - reportedly had C.diff testing out of hospital which was negative. Plan: Assess C.diff PCR.   Best Practice: Code Status:  Full. VTE Prophylaxis:  SCD's / Heparin. Diet:  NPO for now.  Admit to SDU.  TRH flow manager called and asked for transfer to [facility] service starting in AM 12/29.  PCCM will sign off at that time.   Rutherford Guys, [place] - [facility] [place] [facility] Pager: [phone]  or [phone] 11/30/2015, 8:35 PM

## 2015-11-30 NOTE — ED Notes (Signed)
Pt has hx CVA. Pt being treated for UTI. Pt less responsive with family today- just opening eyes. Pt has been complaining of feeling dizzy/off balance since yesterday. Pt's family reported to EMS that this has been going on since her stroke on around thanksgiving. BP 150/100. HR 70, 97% on 4lNC, 921% on room air. CBG 119

## 2015-11-30 NOTE — ED Notes (Signed)
Pt's son at bedside and informed RN on pt's recent medical history- this is different report than what EMS said. Pt had stroke in January last year- some weakness on one side per pt's son, but pt is normally alert/oriented/ambulatory since the stroke. Around Thanksgiving, pt was hospitalized for pneumonia. Pt has HH RN and she has been concerned for UTI. Pt has been confused the past few days, feeling dizzy, fell yesterday, weaker. Pt's son reports she has had diarrhea for several days, this was tested and negative for C diff as of yesterday. The diarrhea also stopped yesterday. Pt is alert to self, place, situation but not date at the present moment. Grips appear equal to this RN. Pt's legs are weak, pt unable to hold them up off the bed. Pt's son reports she has been weaker in her legs over the past few days.

## 2015-11-30 NOTE — ED Notes (Signed)
Dr Adele BarthelMumman at bedside.

## 2015-11-30 NOTE — ED Notes (Signed)
Gave pt's 5 rings to pt's son due to pt's fingers swelling.

## 2015-12-01 DIAGNOSIS — K529 Noninfective gastroenteritis and colitis, unspecified: Secondary | ICD-10-CM | POA: Diagnosis present

## 2015-12-01 DIAGNOSIS — E875 Hyperkalemia: Secondary | ICD-10-CM

## 2015-12-01 DIAGNOSIS — R4182 Altered mental status, unspecified: Secondary | ICD-10-CM | POA: Diagnosis present

## 2015-12-01 LAB — BASIC METABOLIC PANEL
ANION GAP: 13 (ref 5–15)
ANION GAP: 9 (ref 5–15)
BUN: 16 mg/dL (ref 6–20)
BUN: 15 mg/dL (ref 6–20)
CALCIUM: 9.9 mg/dL (ref 8.9–10.3)
CHLORIDE: 73 mmol/L — AB (ref 101–111)
CHLORIDE: 74 mmol/L — AB (ref 101–111)
CO2: 28 mmol/L (ref 22–32)
CO2: 30 mmol/L (ref 22–32)
Calcium: 9.5 mg/dL (ref 8.9–10.3)
Creatinine, Ser: 0.68 mg/dL (ref 0.44–1.00)
Creatinine, Ser: 0.63 mg/dL (ref 0.44–1.00)
GFR calc Af Amer: >60 mL/min (ref >60)
GFR calc non Af Amer: >60 mL/min (ref >60)
GFR calc non Af Amer: >60 mL/min (ref >60)
GLUCOSE: 92 mg/dL (ref 65–99)
Glucose, Bld: 92 mg/dL (ref 65–99)
POTASSIUM: 5.5 mmol/L — AB (ref 3.5–5.1)
Potassium: 6.6 mmol/L (ref 3.5–5.1)
SODIUM: 114 mmol/L — AB (ref 135–145)
Sodium: 113 mmol/L — CL (ref 135–145)

## 2015-12-01 LAB — COMPREHENSIVE METABOLIC PANEL
ALBUMIN: 3 g/dL — AB (ref 3.5–5.0)
ALK PHOS: 84 U/L (ref 38–126)
ALT: 15 U/L (ref 14–54)
ANION GAP: 8 (ref 5–15)
AST: 73 U/L — ABNORMAL HIGH (ref 15–41)
BILIRUBIN TOTAL: 1 mg/dL (ref 0.3–1.2)
BUN: 13 mg/dL (ref 6–20)
CALCIUM: 9.3 mg/dL (ref 8.9–10.3)
CO2: 32 mmol/L (ref 22–32)
Chloride: 75 mmol/L — ABNORMAL LOW (ref 101–111)
Creatinine, Ser: 0.63 mg/dL (ref 0.44–1.00)
GFR calc Af Amer: >60 mL/min (ref >60)
GLUCOSE: 93 mg/dL (ref 65–99)
POTASSIUM: 5.1 mmol/L (ref 3.5–5.1)
Sodium: 115 mmol/L — CL (ref 135–145)
TOTAL PROTEIN: 6.6 g/dL (ref 6.5–8.1)

## 2015-12-01 LAB — CBC
HCT: 34.7 % — ABNORMAL LOW (ref 36.0–46.0)
HEMATOCRIT: 33.8 % — AB (ref 36.0–46.0)
HEMOGLOBIN: 11.5 g/dL — AB (ref 12.0–15.0)
Hemoglobin: 11.4 g/dL — ABNORMAL LOW (ref 12.0–15.0)
MCH: 27.3 pg (ref 26.0–34.0)
MCH: 27.7 pg (ref 26.0–34.0)
MCHC: 33.1 g/dL (ref 30.0–36.0)
MCHC: 33.7 g/dL (ref 30.0–36.0)
MCV: 82.2 fL (ref 78.0–100.0)
MCV: 82.2 fL (ref 78.0–100.0)
PLATELETS: 251 10*3/uL (ref 150–400)
Platelets: 267 10*3/uL (ref 150–400)
RBC: 4.22 MIL/uL (ref 3.87–5.11)
RBC: 4.11 MIL/uL (ref 3.87–5.11)
RDW: 13.4 % (ref 11.5–15.5)
RDW: 13.7 % (ref 11.5–15.5)
WBC: 11.1 10*3/uL — ABNORMAL HIGH (ref 4.0–10.5)
WBC: 10.4 10*3/uL (ref 4.0–10.5)

## 2015-12-01 LAB — TSH: TSH: 0.128 u[IU]/mL — AB (ref 0.350–4.500)

## 2015-12-01 LAB — CORTISOL-AM, BLOOD: Cortisol - AM: 24.1 ug/dL — ABNORMAL HIGH (ref 6.7–22.6)

## 2015-12-01 LAB — T4, FREE: FREE T4: 1.57 ng/dL — AB (ref 0.61–1.12)

## 2015-12-01 LAB — OSMOLALITY: OSMOLALITY: 250 mosm/kg — AB (ref 275–295)

## 2015-12-01 LAB — MAGNESIUM
MAGNESIUM: 2 mg/dL (ref 1.7–2.4)
Magnesium: 1.8 mg/dL (ref 1.7–2.4)

## 2015-12-01 LAB — PHOSPHORUS: Phosphorus: 3.3 mg/dL (ref 2.5–4.6)

## 2015-12-01 MED ORDER — SODIUM CHLORIDE 1 G PO TABS
2.0000 g | ORAL_TABLET | Freq: Two times a day (BID) | ORAL | Status: AC
  Administered 2015-12-01 – 2015-12-02 (×3): 2 g via ORAL
  Filled 2015-12-01 (×3): qty 2

## 2015-12-01 MED ORDER — INSULIN ASPART 100 UNIT/ML ~~LOC~~ SOLN
10.0000 [IU] | Freq: Once | SUBCUTANEOUS | Status: AC
  Administered 2015-12-01: 10 [IU] via INTRAVENOUS

## 2015-12-01 MED ORDER — SODIUM CHLORIDE 1 G PO TABS
2.0000 g | ORAL_TABLET | Freq: Two times a day (BID) | ORAL | Status: DC
  Filled 2015-12-01 (×2): qty 2

## 2015-12-01 MED ORDER — DEXTROSE 50 % IV SOLN
50.0000 mL | Freq: Once | INTRAVENOUS | Status: AC
  Administered 2015-12-01: 50 mL via INTRAVENOUS
  Filled 2015-12-01: qty 50

## 2015-12-01 NOTE — Progress Notes (Signed)
[facility]  Patient Status: Active (receiving services up to time of hospitalization)  [facility] is providing the following services: RN and PT  If patient discharges after hours, please call [phone].   Kizzie FurnishDonna Fellmy 12/01/2015, 9:53 AM

## 2015-12-01 NOTE — Progress Notes (Addendum)
Notified team of new Bmet (sodium 114 and potassium 6.6)  results and clarified enteric precautions and c diff specimen order via call back. Updated family on POC

## 2015-12-01 NOTE — Progress Notes (Signed)
Tammy Fernandez  MISTIE ADNEY ZOX:[id number] DOB: 10-18-1924 DOA: 11/30/2015 PCP: [name], MD  Admit HPI / Brief Narrative: Tammy Fernandez is a 81 y.o. BF PMHx Anxiety, Abnormal Heart Rhythms HLD, HTN, Chronic Respiratory Failure on home  2 L O2, CVA 2015  Brought to [facility] ED 12/28 due to being altered and less interactive compared to usual. Per her son, she has apparently been having vomiting and diarrhea which has intermittently been happening since around thanksgiving (home health RN apparently tested her for C.diff which was negative). Since christmas, she began to have tremor like movements as well and felt as if she was dizzy whenever she would ambulate. Over the past few days, her confusion had worsened and she has been feeling weaker. This prompted son to bring her to ED for further evaluation.  In ED, she was found to be hyponatremic with Na of 111. CT of the head was negative for acute process. TRH was called for admission but felt pt should be admitted to [facility] service.  HPI/Subjective: 12/29  A/O 1 (does not know where, when, why). States negative CP. Unsure if increase SOB over baseline. Son states patient on 2 L O2 chronically since November.  Assessment/Plan: Hyponatremia  - likely due to volume depletion from her vomiting and diarrhea on and off x 1 month. ? SIADH given her hx hyponatremia. -Review of Epic shows patient has been hyponatremic in the mid -high 120s since November -Serum osmolality, urine osmolality pending -Urine electrolytes pending -NS @ 100 for gradual correction of Na (goal ~ 120 over next 24 hours) -NaCl tablets 2 gm BID 3 doses  Hyperkalemia  - resolved.  AMS  -Most likely secondary to hyponatremia, patient much more alert per family members, however still confused -See hyponatremia   Acute on chronic hypoxic respiratory failure  - she was admitted in Nov 2016 for CAP and has required  supplemental O2 since then. -Continue supplemental O2 as needed to maintain SpO2 > 92%. -Albuterol PRN.  HTN -Well controlled -Continue lisinopril, propranolol, amlodipine  Hypothyroidism. Continue synthroid 50 g daily. -Assess TSH. -Obtain free T4  Chronic Diarrhea (diarrhea greater than 3 weeks)  -Obtain Stool osmolality -Obtain stool sodium/potassium -Obtain GI pathogen panel -Obtain occult blood -Obtain Fecal lactoferrin -Obtain Fecal fat  Chronic Hematuria -Patient one year ago also had hematuria. Previously evaluated? -Urine culture     Code Status: FULL Family Communication: Son present at time of exam Disposition Plan: Resolution hyponatremia/diarrhea    Consultants:   Procedure/Significant Events:    Culture 12/20 GI pathogen panel pending   Antibiotics:   DVT prophylaxis: Subcutaneous heparin   Devices    LINES / TUBES:      Continuous Infusions: . sodium chloride 100 mL/hr at 12/01/15 1400    Objective: VITAL SIGNS: Temp: 99.1 F (37.3 C) (12/29 1630) Temp Source: Axillary (12/29 1630) BP: 126/51 mmHg (12/29 1630) Pulse Rate: 96 (12/29 1630) SPO2; FIO2:   Intake/Output Summary (Last 24 hours) at 12/01/15 1838 Last data filed at 12/01/15 1400  Gross per 24 hour  Intake 1708.33 ml  Output      0 ml  Net 1708.33 ml     Exam: General: A/O 1 (does not know where, when, why). Positive chronic respiratory distress Eyes: Negative headache, eye pain, double vision,negative scleral hemorrhage ENT: Negative Runny nose, negative ear pain, negative gingival bleeding, Neck:  Negative scars, masses, torticollis, lymphadenopathy, JVD Lungs: Clear to auscultation bilaterally without  wheezes or crackles Cardiovascular: Regular rate and rhythm without murmur gallop or rub normal S1 and S2 Abdomen:negative abdominal pain, nondistended, positive soft, bowel sounds, no rebound, no ascites, no appreciable mass Extremities: No  significant cyanosis, clubbing, or edema bilateral lower extremities Psychiatric:  Unable to fully assess secondary to altered mental status  Neurologic:  Cranial nerves II through XII intact, tongue/uvula midline, all extremities muscle strength 5/5, sensation intact throughout, negative dysarthria, negative expressive aphasia, negative receptive aphasia.   Data Reviewed: Basic Metabolic Panel:  Recent Labs Lab 11/30/15 1420  11/30/15 1815 11/30/15 1941 11/30/15 2243 12/01/15 0231 12/01/15 1200  NA 111*  < > 112* 111* 114* 113* 115*  K 6.1*  < > 6.6* 5.4* 6.6* 5.5* 5.1  CL 71*  < > 72* 70* 73* 74* 75*  CO2 32  --  29  --  28 30 32  GLUCOSE 101*  < > 87 99 92 92 93  BUN 14  < > CREATININE 0.68  < > 0.63 0.90 0.68 0.63 0.63  CALCIUM 9.7  --  9.6  --  9.9 9.5 9.3  MG  --   --   --   --   --  2.0 1.8  PHOS  --   --   --   --   --  3.3  --   < > = values in this interval not displayed. Liver Function Tests:  Recent Labs Lab 11/30/15 1815 12/01/15 1200  AST 46* 73*  ALT 15 15  ALKPHOS 87 84  BILITOT 1.3* 1.0  PROT 7.3 6.6  ALBUMIN 3.4* 3.0*   No results for input(s): LIPASE, AMYLASE in the last 168 hours.  Recent Labs Lab 11/30/15 1420  AMMONIA 59*   CBC:  Recent Labs Lab 11/30/15 1420 11/30/15 1800 11/30/15 1941 12/01/15 0231 12/01/15 1200  WBC 10.3  --   --  11.1* 10.4  NEUTROABS 7.7  --   --   --   --   HGB 11.2* 15.6* 15.0 11.5* 11.4*  HCT 33.6* 46.0 44.0 34.7* 33.8*  MCV 82.4  --   --  82.2 82.2  PLT 236  --   --  267 251   Cardiac Enzymes:  Recent Labs Lab 11/30/15 1427  TROPONINI <0.03   BNP (last 3 results)  Recent Labs  10/29/15 1319  BNP 53.8    ProBNP (last 3 results) No results for input(s): PROBNP in the last 8760 hours.  CBG: No results for input(s): GLUCAP in the last 168 hours.  No results found for this or any previous visit (from the past 240 hour(s)).   Studies:  Recent x-ray studies have been  reviewed in detail by the Attending Physician  Scheduled Meds:  Scheduled Meds: . amLODipine  5 mg Oral Daily  . aspirin EC  81 mg Oral Daily  . famotidine  20 mg Oral Daily  . heparin  5,000 Units Subcutaneous 3 times per day  . levothyroxine  50 mcg Oral QAC breakfast  . lisinopril  5 mg Oral Daily  . pantoprazole  40 mg Oral Daily  . propranolol ER  60 mg Oral Daily  . sodium chloride  2 g Oral BID WC    Time spent on care of this patient: 40 mins   WOODS, Roselind Messier , MD  [facility] Office  [phone] Pager - [phone]  On-Call/Text Page:      [email or web address]      password  [email or web address]  If 7PM-7AM, please contact night-coverage [email or web address] Password [email or web address] 12/01/2015, 6:38 PM   LOS: 1 day   Care during the described time interval was provided by me .  I have reviewed this patient's available data, including medical history, events of Fernandez, physical examination, and all test results as part of my evaluation. I have personally reviewed and interpreted all radiology studies.   Carolyne Littlesurtis Woods, MD [phone] Pager

## 2015-12-01 NOTE — Progress Notes (Signed)
CRITICAL VALUE ALERT  Critical value received:  Sodium 115  Date of notification:  12/01/2015  Time of notification:  1250  Critical value read back:Yes.    Nurse who received alert:  Wynona Caneselenia Jadence Kinlaw  MD notified (1st page):  Dr. Joseph ArtWoods  Time of first page:  1251  Responding MD:  Dr. Joseph ArtWoods

## 2015-12-01 NOTE — Progress Notes (Signed)
PCCM Interval Progress Note  Events:  Repeat BMP shows improving Na but also increased K / hyperkalemia.  Interventions:  1amp D50 and 10u insulin now.  Follow up BMP at 0500.   Rutherford Guysahul Desai, [place] - [facility] [place] [facility] Pager: [phone]  or [phone] 12/01/2015, 2:42 AM

## 2015-12-01 NOTE — Evaluation (Signed)
Clinical/Bedside Swallow Evaluation Patient Details  Name: Hollie BeachRosa L Sabic MRN: [id number] Date of Birth: 01-30-1924  Today's Date: 12/01/2015 Time: SLP Start Time (ACUTE ONLY): 0907 SLP Stop Time (ACUTE ONLY): 0920 SLP Time Calculation (min) (ACUTE ONLY): 13 min  Past Medical History:  Past Medical History  Diagnosis Date  . Hyperlipidemia   . Hypertension   . Anxiety   . GERD (gastroesophageal reflux disease)   . Abnormal heart rhythms   . Fatty liver   . Internal hemorrhoid    Past Surgical History:  Past Surgical History  Procedure Laterality Date  . Cesarean section    . Abdominal hysterectomy    . Tee without cardioversion N/A 01/19/2014    Procedure: TRANSESOPHAGEAL ECHOCARDIOGRAM (TEE);  Surgeon: Laurey Moralealton S McLean, MD;  [facility];  Service: Cardiovascular;  Laterality: N/A;  [name]/ja   HPI:  81 year old with history of stroke, GERD, recent admission for CAP 11/16. She was discharged on Omnicef and azithromycin. Admitted today with altered mental status, vomiting, diarrhea, poor PO intake since Thanksgiving. C. difficile checked at home and was negative. She was brought to the ED by her son as her mental status and confusion worsened. Found to be hyponatremic. MD asks for free water restriction when PO intake begins. CT head negative for acute process. Pt had similiar admission in 01/18/14 with hyponatremia, mild oral dyspahgia, d/c'd from rehab on dys 3/thin liquids.    Assessment / Plan / Recommendation Clinical Impression  Pt demonstrates normal swallow function. No signs of aspiration or oral dysphagia. Recommend pt initiate a regular texture diet and thin liquids. No SLP f/u needed. Will write diet, MD to modify fluid restriction. RN aware.     Aspiration Risk  Mild aspiration risk    Diet Recommendation Regular;Thin liquid   Liquid Administration via: Cup;Straw Medication Administration: Whole meds with liquid Supervision: Patient able to self  feed Postural Changes: Seated upright at 90 degrees    Other  Recommendations Oral Care Recommendations: Oral care BID   Follow up Recommendations  None    Frequency and Duration            Prognosis        Swallow Study   General HPI: 81 year old with history of stroke, GERD, recent admission for CAP 11/16. She was discharged on Omnicef and azithromycin. Admitted today with altered mental status, vomiting, diarrhea, poor PO intake since Thanksgiving. C. difficile checked at home and was negative. She was brought to the ED by her son as her mental status and confusion worsened. Found to be hyponatremic. MD asks for free water restriction when PO intake begins. CT head negative for acute process. Pt had similiar admission in 01/18/14 with hyponatremia, mild oral dyspahgia, d/c'd from rehab on dys 3/thin liquids.  Type of Study: Bedside Swallow Evaluation Previous Swallow Assessment: yes, see history Diet Prior to this Study: NPO Temperature Spikes Noted: No Respiratory Status: Nasal cannula History of Recent Intubation: No Behavior/Cognition: Alert;Cooperative;Confused Oral Cavity Assessment: Within Functional Limits Oral Care Completed by SLP: No Oral Cavity - Dentition: Missing dentition Vision: Functional for self-feeding Self-Feeding Abilities: Able to feed self;Needs assist Patient Positioning: Upright in bed Baseline Vocal Quality: Normal Volitional Cough: Strong Volitional Swallow: Able to elicit    Oral/Motor/Sensory Function Overall Oral Motor/Sensory Function: Within functional limits   Ice Chips     Thin Liquid Thin Liquid: Within functional limits    Nectar Thick     Honey Thick     Puree Puree: Within  functional limits   Solid   GO    Solid: Within functional limits      Harlon Ditty, MA CCC-SLP [phone]  Kaegan Hettich, Riley Nearing 12/01/2015,9:27 AM

## 2015-12-01 NOTE — Progress Notes (Signed)
Utilization review completed. Ginna Schuur, RN, BSN. 

## 2015-12-02 DIAGNOSIS — R319 Hematuria, unspecified: Secondary | ICD-10-CM

## 2015-12-02 DIAGNOSIS — R5381 Other malaise: Secondary | ICD-10-CM | POA: Diagnosis present

## 2015-12-02 LAB — COMPREHENSIVE METABOLIC PANEL
ALBUMIN: 3 g/dL — AB (ref 3.5–5.0)
ALT: 16 U/L (ref 14–54)
ANION GAP: 6 (ref 5–15)
AST: 68 U/L — ABNORMAL HIGH (ref 15–41)
Alkaline Phosphatase: 86 U/L (ref 38–126)
BILIRUBIN TOTAL: 0.8 mg/dL (ref 0.3–1.2)
BUN: 11 mg/dL (ref 6–20)
CHLORIDE: 79 mmol/L — AB (ref 101–111)
CO2: 32 mmol/L (ref 22–32)
Calcium: 9.1 mg/dL (ref 8.9–10.3)
Creatinine, Ser: 0.58 mg/dL (ref 0.44–1.00)
GFR calc Af Amer: >60 mL/min (ref >60)
GFR calc non Af Amer: >60 mL/min (ref >60)
GLUCOSE: 182 mg/dL — AB (ref 65–99)
POTASSIUM: 4.3 mmol/L (ref 3.5–5.1)
SODIUM: 117 mmol/L — AB (ref 135–145)
TOTAL PROTEIN: 6.8 g/dL (ref 6.5–8.1)

## 2015-12-02 LAB — CBC WITH DIFFERENTIAL/PLATELET
BASOS ABS: 0 10*3/uL (ref 0.0–0.1)
BASOS PCT: 0 %
Eosinophils Absolute: 0.1 10*3/uL (ref 0.0–0.7)
Eosinophils Relative: 1 %
HCT: 36.3 % (ref 36.0–46.0)
Hemoglobin: 11.8 g/dL — ABNORMAL LOW (ref 12.0–15.0)
LYMPHS PCT: 18 %
Lymphs Abs: 1.7 10*3/uL (ref 0.7–4.0)
MCH: 27.3 pg (ref 26.0–34.0)
MCHC: 32.5 g/dL (ref 30.0–36.0)
MCV: 84 fL (ref 78.0–100.0)
MONO ABS: 0.7 10*3/uL (ref 0.1–1.0)
Monocytes Relative: 8 %
NEUTROS ABS: 6.9 10*3/uL (ref 1.7–7.7)
NEUTROS PCT: 73 %
PLATELETS: 267 10*3/uL (ref 150–400)
RBC: 4.32 MIL/uL (ref 3.87–5.11)
RDW: 13.7 % (ref 11.5–15.5)
WBC: 9.4 10*3/uL (ref 4.0–10.5)

## 2015-12-02 LAB — OSMOLALITY, URINE: OSMOLALITY UR: 366 mosm/kg (ref 300–900)

## 2015-12-02 LAB — MAGNESIUM: Magnesium: 1.7 mg/dL (ref 1.7–2.4)

## 2015-12-02 MED ORDER — WHITE PETROLATUM GEL
Status: AC
  Administered 2015-12-02: 1
  Filled 2015-12-02: qty 1

## 2015-12-02 MED ORDER — SODIUM CHLORIDE 0.9 % IV BOLUS (SEPSIS): 1000.0000 mL | Freq: Once | INTRAVENOUS | Status: DC

## 2015-12-02 MED ORDER — AMLODIPINE BESYLATE 10 MG PO TABS
10.0000 mg | ORAL_TABLET | Freq: Every day | ORAL | Status: DC
  Administered 2015-12-03 – 2015-12-15 (×9): 10 mg via ORAL
  Filled 2015-12-02 (×11): qty 1

## 2015-12-02 MED ORDER — OLOPATADINE HCL 0.1 % OP SOLN
1.0000 [drp] | Freq: Every day | OPHTHALMIC | Status: DC
Start: 2015-12-02 — End: 2015-12-15
  Administered 2015-12-03 – 2015-12-15 (×14): 1 [drp] via OPHTHALMIC
  Filled 2015-12-02 (×2): qty 5

## 2015-12-02 NOTE — Progress Notes (Signed)
Pt act w [facility] for hhrn and hhpt. Tammy Fernandez w [facility] aware of pt's adm.

## 2015-12-02 NOTE — Progress Notes (Signed)
[place] TEAM 1 - Stepdown/ICU TEAM Progress Note  Tammy Fernandez ZOX:[id number] DOB: 1924/06/06 DOA: 11/30/2015 PCP: [name], MD  Admit HPI / Brief Narrative: Tammy Fernandez is a 81 y.o. BF PMHx Anxiety, Abnormal Heart Rhythms HLD, HTN, Chronic Respiratory Failure on home  2 L O2, CVA 2015  Brought to [facility] ED 12/28 due to being altered and less interactive compared to usual. Per her son, she has apparently been having vomiting and diarrhea which has intermittently been happening since around thanksgiving (home health RN apparently tested her for C.diff which was negative). Since christmas, she began to have tremor like movements as well and felt as if she was dizzy whenever she would ambulate. Over the past few days, her confusion had worsened and she has been feeling weaker. This prompted son to bring her to ED for further evaluation.  In ED, she was found to be hyponatremic with Na of 111. CT of the head was negative for acute process. TRH was called for admission but felt pt should be admitted to [facility] service.  HPI/Subjective: 12/ 30  A/O 3 (does not know why). States negative CP, positive continued chronic SOB, negative N/V      Assessment/Plan: Hyponatremia  - likely due to volume depletion from her vomiting and diarrhea on and off x 1 month. ? SIADH given her hx hyponatremia. -Review of Epic shows patient has been hyponatremic in the mid -high 120s since November -Serum osmolality, urine osmolality pending -Urine electrolytes pending -NS @ 100 for gradual correction of Na (goal ~ 120 over next 24 hours) -NaCl tablets 2 gm BID 3 doses  Hyperkalemia  - resolved.  AMS  -Most likely secondary to hyponatremia, patient much more alert per family members, however still confused -See hyponatremia   Acute on chronic hypoxic respiratory failure  - she was admitted in Nov 2016 for CAP and has required supplemental O2 since then. -Continue supplemental O2 as  needed to maintain SpO2 > 92%. -Albuterol PRN.  HTN -controlled -Increase amlodipine 10 mg daily -Continue lisinopril 5 mg daily -Propranolol 60 mg daily  Hypothyroidism. Continue synthroid 50 g daily. -Low TSH. -Free T4; slightly elevated will not make changes at this time  Chronic Diarrhea (diarrhea greater than 3 weeks)  -Obtain Stool osmolality -Obtain stool sodium/potassium -Obtain GI pathogen panel -Obtain occult blood -Obtain Fecal lactoferrin -Obtain Fecal fat  Chronic Hematuria -Patient one year ago also had hematuria. Previously evaluated? -Urine culture -Repeat urinalysis in A.m.  Deconditioning -PT/OT consult; Patient has been in the hospital 2 times within the last 30 days, significantly deconditioned. Some residual weakness from CVA in 2015 evaluate for CIR vs SNF   Code Status: FULL Family Communication: Son present at time of exam Disposition Plan: Resolution hyponatremia/diarrhea    Consultants:   Procedure/Significant Events:    Culture 12/20 GI pathogen panel pending   Antibiotics:   DVT prophylaxis: Subcutaneous heparin   Devices    LINES / TUBES:      Continuous Infusions: . sodium chloride Stopped (12/02/15 1500)    Objective: VITAL SIGNS: Temp: 98.7 F (37.1 C) (12/30 1959) Temp Source: Oral (12/30 1959) BP: 156/59 mmHg (12/30 1959) Pulse Rate: 80 (12/30 1959) SPO2; FIO2:   Intake/Output Summary (Last 24 hours) at 12/02/15 2017 Last data filed at 12/02/15 1800  Gross per 24 hour  Intake   2860 ml  Output    450 ml  Net   2410 ml     Exam: General: A/O 3 (  does not know why), follows all commands. Positive chronic respiratory distress Eyes: Negative headache, eye pain, double vision,negative scleral hemorrhage ENT: Negative Runny nose, negative ear pain, negative gingival bleeding, Neck:  Negative scars, masses, torticollis, lymphadenopathy, JVD Lungs: Clear to auscultation bilaterally without wheezes  or crackles Cardiovascular: Regular rate and rhythm without murmur gallop or rub normal S1 and S2 Abdomen:negative abdominal pain, nondistended, positive soft, bowel sounds, no rebound, no ascites, no appreciable mass Extremities: No significant cyanosis, clubbing, or edema bilateral lower extremities Psychiatric:  Unable to fully assess secondary to altered mental status  Neurologic:  Cranial nerves II through XII intact, tongue/uvula midline, all extremities muscle strength 5/5, sensation intact throughout, negative dysarthria, negative expressive aphasia, negative receptive aphasia.   Data Reviewed: Basic Metabolic Panel:  Recent Labs Lab 11/30/15 1815 11/30/15 1941 11/30/15 2243 12/01/15 0231 12/01/15 1200 12/02/15 0957  NA 112* 111* 114* 113* 115* 117*  K 6.6* 5.4* 6.6* 5.5* 5.1 4.3  CL 72* 70* 73* 74* 75* 79*  CO2 29  --  28 30 32 32  GLUCOSE 87 99 92 92 93 182*  BUN CREATININE 0.63 0.90 0.68 0.63 0.63 0.58  CALCIUM 9.6  --  9.9 9.5 9.3 9.1  MG  --   --   --  2.0 1.8 1.7  PHOS  --   --   --  3.3  --   --    Liver Function Tests:  Recent Labs Lab 11/30/15 1815 12/01/15 1200 12/02/15 0957  AST 46* 73* 68*  ALT ALKPHOS 87 84 86  BILITOT 1.3* 1.0 0.8  PROT 7.3 6.6 6.8  ALBUMIN 3.4* 3.0* 3.0*   No results for input(s): LIPASE, AMYLASE in the last 168 hours.  Recent Labs Lab 11/30/15 1420  AMMONIA 59*   CBC:  Recent Labs Lab 11/30/15 1420 11/30/15 1800 11/30/15 1941 12/01/15 0231 12/01/15 1200 12/02/15 0957  WBC 10.3  --   --  11.1* 10.4 9.4  NEUTROABS 7.7  --   --   --   --  6.9  HGB 11.2* 15.6* 15.0 11.5* 11.4* 11.8*  HCT 33.6* 46.0 44.0 34.7* 33.8* 36.3  MCV 82.4  --   --  82.2 82.2 84.0  PLT 236  --   --  267 251 267   Cardiac Enzymes:  Recent Labs Lab 11/30/15 1427  TROPONINI <0.03   BNP (last 3 results)  Recent Labs  10/29/15 1319  BNP 53.8    ProBNP (last 3 results) No results for input(s): PROBNP  in the last 8760 hours.  CBG: No results for input(s): GLUCAP in the last 168 hours.  No results found for this or any previous visit (from the past 240 hour(s)).   Studies:  Recent x-ray studies have been reviewed in detail by the Attending Physician  Scheduled Meds:  Scheduled Meds: . [START ON 12/03/2015] amLODipine  10 mg Oral Daily  . aspirin EC  81 mg Oral Daily  . famotidine  20 mg Oral Daily  . heparin  5,000 Units Subcutaneous 3 times per day  . levothyroxine  50 mcg Oral QAC breakfast  . lisinopril  5 mg Oral Daily  . pantoprazole  40 mg Oral Daily  . propranolol ER  60 mg Oral Daily  . sodium chloride  1,000 mL Intravenous Once    Time spent on care of this patient: 40 mins   Osmond Steckman, Roselind Messier , MD  [facility]  Office  [phone] Pager - [phone]  On-Call/Text Page:      [email or web address] [email or web address]      password [email or web address]  If 7PM-7AM, please contact night-coverage [email or web address] Password [email or web address] 12/02/2015, 8:17 PM   LOS: 2 days   Care during the described time interval was provided by me .  I have reviewed this patient's available data, including medical history, events of note, physical examination, and all test results as part of my evaluation. I have personally reviewed and interpreted all radiology studies.   Carolyne Littlesurtis Yomayra Tate, MD [phone] Pager

## 2015-12-03 DIAGNOSIS — I10 Essential (primary) hypertension: Secondary | ICD-10-CM | POA: Diagnosis present

## 2015-12-03 LAB — BASIC METABOLIC PANEL
ANION GAP: 4 — AB (ref 5–15)
BUN: 10 mg/dL (ref 6–20)
CO2: 35 mmol/L — AB (ref 22–32)
Calcium: 8.8 mg/dL — ABNORMAL LOW (ref 8.9–10.3)
Chloride: 79 mmol/L — ABNORMAL LOW (ref 101–111)
Creatinine, Ser: 0.61 mg/dL (ref 0.44–1.00)
GFR calc Af Amer: >60 mL/min (ref >60)
GFR calc non Af Amer: >60 mL/min (ref >60)
GLUCOSE: 136 mg/dL — AB (ref 65–99)
POTASSIUM: 4.8 mmol/L (ref 3.5–5.1)
Sodium: 118 mmol/L — CL (ref 135–145)

## 2015-12-03 LAB — CBC WITH DIFFERENTIAL/PLATELET
BASOS ABS: 0 10*3/uL (ref 0.0–0.1)
Basophils Relative: 0 %
Eosinophils Absolute: 0.2 10*3/uL (ref 0.0–0.7)
Eosinophils Relative: 2 %
HEMATOCRIT: 33.4 % — AB (ref 36.0–46.0)
Hemoglobin: 10.7 g/dL — ABNORMAL LOW (ref 12.0–15.0)
LYMPHS PCT: 23 %
Lymphs Abs: 2.3 10*3/uL (ref 0.7–4.0)
MCH: 27 pg (ref 26.0–34.0)
MCHC: 32 g/dL (ref 30.0–36.0)
MCV: 84.3 fL (ref 78.0–100.0)
Monocytes Absolute: 0.9 10*3/uL (ref 0.1–1.0)
Monocytes Relative: 9 %
NEUTROS ABS: 6.8 10*3/uL (ref 1.7–7.7)
Neutrophils Relative %: 66 %
Platelets: 263 10*3/uL (ref 150–400)
RBC: 3.96 MIL/uL (ref 3.87–5.11)
RDW: 13.5 % (ref 11.5–15.5)
WBC: 10.1 10*3/uL (ref 4.0–10.5)

## 2015-12-03 LAB — URINALYSIS, ROUTINE W REFLEX MICROSCOPIC
BILIRUBIN URINE: NEGATIVE
Glucose, UA: NEGATIVE mg/dL
Ketones, ur: NEGATIVE mg/dL
Leukocytes, UA: NEGATIVE
NITRITE: POSITIVE — AB
PH: 6.5 (ref 5.0–8.0)
Protein, ur: NEGATIVE mg/dL
SPECIFIC GRAVITY, URINE: 1.012 (ref 1.005–1.030)

## 2015-12-03 LAB — URINE CULTURE: Culture: NO GROWTH

## 2015-12-03 LAB — URINE MICROSCOPIC-ADD ON

## 2015-12-03 LAB — MAGNESIUM: Magnesium: 2.2 mg/dL (ref 1.7–2.4)

## 2015-12-03 MED ORDER — SODIUM CHLORIDE 1 G PO TABS
2.0000 g | ORAL_TABLET | Freq: Three times a day (TID) | ORAL | Status: DC
  Filled 2015-12-03: qty 2

## 2015-12-03 MED ORDER — WHITE PETROLATUM GEL
Status: AC
  Administered 2015-12-03: 0.2
  Filled 2015-12-03: qty 1

## 2015-12-03 MED ORDER — SODIUM CHLORIDE 1 G PO TABS
2.0000 g | ORAL_TABLET | Freq: Three times a day (TID) | ORAL | Status: DC
  Administered 2015-12-03 – 2015-12-05 (×2): 2 g via ORAL
  Filled 2015-12-03 (×6): qty 2

## 2015-12-03 MED ORDER — PAROXETINE HCL 20 MG PO TABS
20.0000 mg | ORAL_TABLET | Freq: Every day | ORAL | Status: DC
  Administered 2015-12-03: 20 mg via ORAL
  Filled 2015-12-03: qty 1

## 2015-12-03 MED ORDER — DEXTROSE 5 % IV SOLN
2.0000 g | INTRAVENOUS | Status: DC
  Administered 2015-12-03 – 2015-12-05 (×2): 2 g via INTRAVENOUS
  Filled 2015-12-03 (×4): qty 2

## 2015-12-03 NOTE — Evaluation (Signed)
Physical Therapy Evaluation Patient Details Name: Tammy Fernandez MRN: [id number] DOB: Jul 04, 1924 Today's Date: 12/03/2015   History of Present Illness  Pt is a 81 y/o F admitted due to AMS and less interactive as usual.  CT of head negative for acute process.  Dx: hyponatremia.  Pt's PMH includes CVA in 2015, anxiety.  Clinical Impression  Pt admitted with above diagnosis. Pt currently with functional limitations due to the deficits listed below (see PT Problem List). Tammy Fernandez is from home where she has assist from daughter for bathing, dressing.  PTA pt ambulating short distances w/o AD.  Currently pt requires +2 mod assist for safe transfer to chair.  Pt will benefit from skilled PT to increase their independence and safety with mobility to allow discharge to the venue listed below.      Follow Up Recommendations SNF;Supervision/Assistance - 24 hour    Equipment Recommendations  Other (comment) (TBD at next venue of care)    Recommendations for Other Services       Precautions / Restrictions Precautions Precautions: Fall Restrictions Weight Bearing Restrictions: No      Mobility  Bed Mobility Overal bed mobility: Needs Assistance;+2 for physical assistance Bed Mobility: Supine to Sit     Supine to sit: Mod assist;+2 for physical assistance;HOB elevated     General bed mobility comments: Cues for technique, HHA to pull up to sitting w/ support provided to trunk. Assist using bed pad to scoot to EOB.  Transfers Overall transfer level: Needs assistance Equipment used: 2 person hand held assist Transfers: Sit to/from UGI CorporationStand;Stand Pivot Transfers Sit to Stand: Mod assist;+2 physical assistance Stand pivot transfers: Mod assist;+2 physical assistance       General transfer comment: Assist to boost up to standing.  Pt moving feet and assist w/ turning to sit in chair, Bil UE support and use of gait belt for support to steady.  Ambulation/Gait             General  Gait Details: deferred this session for pt/therapist safety  Stairs            Wheelchair Mobility    Modified Rankin (Stroke Patients Only)       Balance Overall balance assessment: Needs assistance Sitting-balance support: Bilateral upper extremity supported;Feet supported Sitting balance-[name]: Poor Sitting balance - Comments: Close min guard>min assist to maintain upright Postural control: Posterior lean Standing balance support: Bilateral upper extremity supported;During functional activity Standing balance-[name]: Poor                               Pertinent Vitals/Pain Pain Assessment: No/denies pain    Home Living Family/patient expects to be discharged to:: Skilled nursing facility Living Arrangements: Children (lives w/ daughter) Available Help at Discharge: Family;Available 24 hours/day Type of Home: House Home Access: Stairs to enter Entrance Stairs-Rails: Right Entrance Stairs-Number of Steps: 4 Home Layout: One level Home Equipment: Bedside commode;Wheelchair - manual;Cane - single point      Prior Function Level of Independence: Needs assistance   Gait / Transfers Assistance Needed: Short distances in home w/o AD, until recently pt unable to ambulate.  ADL's / Homemaking Assistance Needed: daughter assists w/ sponge bath and dressing        Hand Dominance        Extremity/Trunk Assessment   Upper Extremity Assessment: Generalized weakness (weakness Rt>Lt due to h/o stroke)  Lower Extremity Assessment: Generalized weakness (weakness Rt>Lt due to h/o stroke)         Communication   Communication: No difficulties  Cognition Arousal/Alertness: Awake/alert Behavior During Therapy: Anxious Overall Cognitive Status: Impaired/Different from baseline Area of Impairment: Orientation;Attention;Memory;Following commands;Safety/judgement;Awareness;Problem solving Orientation Level: Disoriented  to;Place;Time;Person;Situation Current Attention Level: Focused Memory: Decreased short-term memory Following Commands: Follows one step commands inconsistently Safety/Judgement: Decreased awareness of safety;Decreased awareness of deficits Awareness: Intellectual Problem Solving: Slow processing;Decreased initiation;Difficulty sequencing;Requires verbal cues;Requires tactile cues General Comments: Pt says she was born in 6.  She continues to repeat "I can't walk", and other mentionings such as, "I was pregnant for 3 years", unrelated to topic at hand.    General Comments General comments (skin integrity, edema, etc.): Pt's daughter present throughout session and very supportive Eber Jones)    Exercises        Assessment/Plan    PT Assessment Patient needs continued PT services  PT Diagnosis Difficulty walking;Altered mental status   PT Problem List Decreased strength;Decreased range of motion;Decreased activity tolerance;Decreased balance;Decreased mobility;Decreased cognition;Decreased knowledge of use of DME;Decreased safety awareness;Cardiopulmonary status limiting activity  PT Treatment Interventions DME instruction;Gait training;Functional mobility training;Therapeutic activities;Therapeutic exercise;Balance training;Neuromuscular re-education;Cognitive remediation;Patient/family education;Wheelchair mobility training   PT Goals (Current goals can be found in the Care Plan section) Acute Rehab PT Goals Patient Stated Goal: none stated PT Goal Formulation: With patient/family Time For Goal Achievement: 12/17/15 Potential to Achieve Goals: Good    Frequency Min 2X/week   Barriers to discharge        Co-evaluation               End of Session Equipment Utilized During Treatment: Gait belt;Oxygen Activity Tolerance: Patient tolerated treatment well;Patient limited by fatigue Patient left: in chair;with call bell/phone within reach;with chair alarm set;with  family/visitor present Nurse Communication: Mobility status         Time: 0981-1914 PT Time Calculation (min) (ACUTE ONLY): 27 min   Charges:   PT Evaluation $Initial PT Evaluation Tier I: 1 Procedure PT Treatments $Therapeutic Activity: 8-22 mins   PT G Codes:       Michail Jewels PT, DPT [phone] Pager: [phone] 12/03/2015, 12:06 PM

## 2015-12-03 NOTE — Progress Notes (Signed)
CRITICAL VALUE ALERT  Critical value received:  Sodium 118  Date of notification:  12/03/2015  Time of notification:  1319  Critical value read back:Yes.    Nurse who received alert:  Sherene SiresBettie Jo Georgiana Spillane, RN   MD notified (1st page):  Dr. Joseph ArtWoods  Time of first page:  1325  Responding MD:  Dr. Joseph ArtWoods  Time MD responded:  1328  Dr. Joseph ArtWoods responded. Sodium is trending up, no new orders given.

## 2015-12-03 NOTE — Progress Notes (Signed)
[place] TEAM 1 - Stepdown/ICU TEAM Progress Note  TABRIA STEINES ZOX:[id number] DOB: 1924-05-06 DOA: 11/30/2015 PCP: [name], MD  Admit HPI / Brief Narrative: Tammy Fernandez is a 81 y.o. BF PMHx Anxiety, Abnormal Heart Rhythms HLD, HTN, Chronic Respiratory Failure on home  2 L O2, CVA 2015  Brought to [facility] ED 12/28 due to being altered and less interactive compared to usual. Per her son, she has apparently been having vomiting and diarrhea which has intermittently been happening since around thanksgiving (home health RN apparently tested her for C.diff which was negative). Since christmas, she began to have tremor like movements as well and felt as if she was dizzy whenever she would ambulate. Over the past few days, her confusion had worsened and she has been feeling weaker. This prompted son to bring her to ED for further evaluation.  In ED, she was found to be hyponatremic with Na of 111. CT of the head was negative for acute process. TRH was called for admission but felt pt should be admitted to [facility] service.  HPI/Subjective: 12/ 31  A/O 1 (does not know where, when why). Did awaken patient from sleep. Positive perseveration.      Assessment/Plan: Hyponatremia  - likely due to volume depletion from her vomiting and diarrhea on and off x 1 month. -Labs not consistent with SIADH. Normal urine sodium, normal urine osmolality -Review of Epic shows patient has been hyponatremic in the mid -high 120s since November -NS @ 100 for gradual correction of Na  -NaCl tablets 2 gm TID 6 doses  Hyperkalemia  - resolved.  AMS  -Most likely secondary to hyponatremia, patient much more alert per family members, however still confused -See hyponatremia  Anxiety -Patient on Xanax at home would like to avoid using secondary to her AMS. -Would not start SSRI as can cause/exacerbate SIADH  Acute on chronic hypoxic respiratory failure  - she was admitted in Nov 2016 for CAP  and has required supplemental O2 since then. -Continue supplemental O2 as needed to maintain SpO2 > 92%. -Albuterol PRN.  HTN -controlled -Increase amlodipine 10 mg daily -Continue lisinopril 5 mg daily -Propranolol 60 mg daily  Hypothyroidism. Continue synthroid 50 g daily. -Low TSH. -Free T4; slightly elevated will not make changes at this time  Chronic Diarrhea (diarrhea greater than 3 weeks)  -Obtain Stool osmolality -Obtain stool sodium/potassium -Obtain GI pathogen panel -Obtain occult blood -Obtain Fecal lactoferrin -Obtain Fecal fat -12/31 diarrhea resolved above tests not sent  Chronic Hematuria/UTI -Patient one year ago also had hematuria. Previously evaluated? -Urine culture -12/31 Urinalysis c/w a UTI will start on ceftriaxone until cultures return.  Deconditioning -PT/OT consult; Patient has been in the hospital 2 times within the last 30 days, significantly deconditioned. Some residual weakness from CVA in 2015 evaluate for CIR vs SNF   Code Status: FULL Family Communication: None present at time of exam Disposition Plan: Resolution hyponatremia/diarrhea    Consultants:   Procedure/Significant Events:    Culture 12/20 GI pathogen panel not collected 12/30 urine negative final  Antibiotics: Ceftriaxone 12/31>>   DVT prophylaxis: Subcutaneous heparin   Devices    LINES / TUBES:      Continuous Infusions: . sodium chloride 100 mL/hr at 12/03/15 0337    Objective: VITAL SIGNS: Temp: 97.4 F (36.3 C) (12/31 1437) Temp Source: Oral (12/31 1437) BP: 140/59 mmHg (12/31 1600) Pulse Rate: 66 (12/31 1600) SPO2; FIO2:   Intake/Output Summary (Last 24 hours) at 12/03/15 2028 Last  data filed at 12/03/15 0600  Gross per 24 hour  Intake   1240 ml  Output    330 ml  Net    910 ml     Exam: General:A/O 1 (does not know where, when why). Did awaken patient from sleep. Positive perseveration Positive chronic respiratory  distress Eyes: Negative headache, eye pain, double vision,negative scleral hemorrhage ENT: Negative Runny nose, negative ear pain, negative gingival bleeding, Neck:  Negative scars, masses, torticollis, lymphadenopathy, JVD Lungs: Clear to auscultation bilaterally without wheezes or crackles Cardiovascular: Regular rate and rhythm without murmur gallop or rub normal S1 and S2 Abdomen:negative abdominal pain, nondistended, positive soft, bowel sounds, no rebound, no ascites, no appreciable mass Extremities: No significant cyanosis, clubbing, or edema bilateral lower extremities Psychiatric:  Unable to fully assess secondary to altered mental status  Neurologic:  Cranial nerves II through XII intact, tongue/uvula midline, all extremities muscle strength 5/5, sensation intact throughout, negative dysarthria, negative expressive aphasia, negative receptive aphasia.   Data Reviewed: Basic Metabolic Panel:  Recent Labs Lab 11/30/15 2243 12/01/15 0231 12/01/15 1200 12/02/15 0957 12/03/15 1050  NA 114* 113* 115* 117* 118*  K 6.6* 5.5* 5.1 4.3 4.8  CL 73* 74* 75* 79* 79*  CO2 28 30 32 32 35*  GLUCOSE 92 92 93 182* 136*  BUN CREATININE 0.68 0.63 0.63 0.58 0.61  CALCIUM 9.9 9.5 9.3 9.1 8.8*  MG  --  2.0 1.8 1.7 2.2  PHOS  --  3.3  --   --   --    Liver Function Tests:  Recent Labs Lab 11/30/15 1815 12/01/15 1200 12/02/15 0957  AST 46* 73* 68*  ALT ALKPHOS 87 84 86  BILITOT 1.3* 1.0 0.8  PROT 7.3 6.6 6.8  ALBUMIN 3.4* 3.0* 3.0*   No results for input(s): LIPASE, AMYLASE in the last 168 hours.  Recent Labs Lab 11/30/15 1420  AMMONIA 59*   CBC:  Recent Labs Lab 11/30/15 1420  11/30/15 1941 12/01/15 0231 12/01/15 1200 12/02/15 0957 12/03/15 0840  WBC 10.3  --   --  11.1* 10.4 9.4 10.1  NEUTROABS 7.7  --   --   --   --  6.9 6.8  HGB 11.2*  < > 15.0 11.5* 11.4* 11.8* 10.7*  HCT 33.6*  < > 44.0 34.7* 33.8* 36.3 33.4*  MCV 82.4  --   --  82.2  82.2 84.0 84.3  PLT 236  --   --  267 251 267 263  < > = values in this interval not displayed. Cardiac Enzymes:  Recent Labs Lab 11/30/15 1427  TROPONINI <0.03   BNP (last 3 results)  Recent Labs  10/29/15 1319  BNP 53.8    ProBNP (last 3 results) No results for input(s): PROBNP in the last 8760 hours.  CBG: No results for input(s): GLUCAP in the last 168 hours.  Recent Results (from the past 240 hour(s))  Culture, Urine     Status: None   Collection Time: 12/02/15  9:04 AM  Result Value Ref Range Status   Specimen Description URINE, CATHETERIZED  Final   Special Requests NONE  Final   Culture NO GROWTH 1 DAY  Final   Report Status 12/03/2015 FINAL  Final     Studies:  Recent x-ray studies have been reviewed in detail by the Attending Physician  Scheduled Meds:  Scheduled Meds: . amLODipine  10 mg Oral Daily  . aspirin EC  81  mg Oral Daily  . cefTRIAXone (ROCEPHIN)  IV  2 g Intravenous Q24H  . famotidine  20 mg Oral Daily  . heparin  5,000 Units Subcutaneous 3 times per day  . levothyroxine  50 mcg Oral QAC breakfast  . lisinopril  5 mg Oral Daily  . olopatadine  1 drop Both Eyes Daily  . pantoprazole  40 mg Oral Daily  . propranolol ER  60 mg Oral Daily  . sodium chloride  1,000 mL Intravenous Once  . sodium chloride  2 g Oral TID WC    Time spent on care of this patient: 40 mins   Ludie Hudon, Roselind MessierURTIS J , MD  [facility] Office  [phone] Pager - [phone]  On-Call/Text Page:      Loretha [email or web address]      password [email or web address]  If 7PM-7AM, please contact night-coverage [email or web address] Password [email or web address] 12/03/2015, 8:28 PM   LOS: 3 days   Care during the described time interval was provided by me .  I have reviewed this patient's available data, including medical history, events of note, physical examination, and all test results as part of my evaluation. I have personally reviewed and interpreted all radiology studies.   Carolyne Littlesurtis Rudell Marlowe, MD [phone]  Pager

## 2015-12-03 NOTE — Progress Notes (Signed)
ANTIBIOTIC CONSULT NOTE - INITIAL  Pharmacy Consult for Rocephin Indication: UTI  Allergies  Allergen Reactions  . Apple Flavor Hives    No apple sauce  . Peanut Butter Flavor Hives  . Cephalexin Other (See Comments)    dizziness  . Crestor [Rosuvastatin Calcium]   . Sulfa Drugs Cross Reactors Rash    Patient Measurements: Height: 5\' 2"  (157.5 cm) Weight: 206 lb 9.1 oz (93.7 kg) IBW/kg (Calculated) : 50.1 Adjusted Body Weight:   Vital Signs: Temp: 97.5 F (36.4 C) (12/31 0711) Temp Source: Oral (12/31 0711) BP: 106/45 mmHg (12/31 0402) Pulse Rate: 62 (12/31 0402) Intake/Output from previous day: 12/30 0701 - 12/31 0700 In: 3300 [P.O.:1200; I.V.:2100] Out: 780 [Urine:780] Intake/Output from this shift:    Labs:  Recent Labs  12/01/15 0231 12/01/15 1200 12/02/15 0957  WBC 11.1* 10.4 9.4  HGB 11.5* 11.4* 11.8*  PLT 267 251 267  CREATININE 0.63 0.63 0.58   Estimated Creatinine Clearance: 50.8 mL/min (by C-G formula based on Cr of 0.58). No results for input(s): VANCOTROUGH, VANCOPEAK, VANCORANDOM, GENTTROUGH, GENTPEAK, GENTRANDOM, TOBRATROUGH, TOBRAPEAK, TOBRARND, AMIKACINPEAK, AMIKACINTROU, AMIKACIN in the last 72 hours.   Microbiology: No results found for this or any previous visit (from the past 720 hour(s)).  Medical History: Past Medical History  Diagnosis Date  . Hyperlipidemia   . Hypertension   . Anxiety   . GERD (gastroesophageal reflux disease)   . Abnormal heart rhythms   . Fatty liver   . Internal hemorrhoid     Medications:  Scheduled:  . amLODipine  10 mg Oral Daily  . aspirin EC  81 mg Oral Daily  . cefTRIAXone (ROCEPHIN)  IV  2 g Intravenous Q24H  . famotidine  20 mg Oral Daily  . heparin  5,000 Units Subcutaneous 3 times per day  . levothyroxine  50 mcg Oral QAC breakfast  . lisinopril  5 mg Oral Daily  . olopatadine  1 drop Both Eyes Daily  . pantoprazole  40 mg Oral Daily  . PARoxetine  20 mg Oral Daily  . propranolol ER  60  mg Oral Daily  . sodium chloride  1,000 mL Intravenous Once   Assessment: Pharmacy consulted to ceftriaxone for suspected UTI. Patient currently afebrile with WBC WNL. Patient has a keflex allergy (dizziness) but has tolerated ceftriaxone in the past. Urine cultures pending.  Plan: Ceftriaxone 2g IV daily Pharmacy will sign off; drug does not require dose adjustment for renal function   Reuel Derbyobert J Vincent, PharmD Clinical Pharmacy Resident 12/03/2015,7:53 AM

## 2015-12-04 ENCOUNTER — Inpatient Hospital Stay (HOSPITAL_COMMUNITY): Payer: Medicare Other

## 2015-12-04 DIAGNOSIS — F411 Generalized anxiety disorder: Secondary | ICD-10-CM | POA: Diagnosis present

## 2015-12-04 DIAGNOSIS — Z452 Encounter for adjustment and management of vascular access device: Secondary | ICD-10-CM | POA: Diagnosis present

## 2015-12-04 DIAGNOSIS — J9622 Acute and chronic respiratory failure with hypercapnia: Secondary | ICD-10-CM | POA: Diagnosis present

## 2015-12-04 LAB — BASIC METABOLIC PANEL
ANION GAP: 6 (ref 5–15)
Anion gap: 8 (ref 5–15)
BUN: 7 mg/dL (ref 6–20)
BUN: 8 mg/dL (ref 6–20)
CALCIUM: 9.1 mg/dL (ref 8.9–10.3)
CHLORIDE: 75 mmol/L — AB (ref 101–111)
CO2: 34 mmol/L — ABNORMAL HIGH (ref 22–32)
CO2: 41 mmol/L — AB (ref 22–32)
Calcium: 9 mg/dL (ref 8.9–10.3)
Chloride: 75 mmol/L — ABNORMAL LOW (ref 101–111)
Creatinine, Ser: 0.43 mg/dL — ABNORMAL LOW (ref 0.44–1.00)
Creatinine, Ser: 0.58 mg/dL (ref 0.44–1.00)
GFR calc Af Amer: >60 mL/min (ref >60)
GFR calc non Af Amer: >60 mL/min (ref >60)
Glucose, Bld: 118 mg/dL — ABNORMAL HIGH (ref 65–99)
Glucose, Bld: 112 mg/dL — ABNORMAL HIGH (ref 65–99)
POTASSIUM: 3.9 mmol/L (ref 3.5–5.1)
POTASSIUM: 3.4 mmol/L — AB (ref 3.5–5.1)
SODIUM: 117 mmol/L — AB (ref 135–145)
Sodium: 122 mmol/L — ABNORMAL LOW (ref 135–145)

## 2015-12-04 LAB — MAGNESIUM: Magnesium: 1.7 mg/dL (ref 1.7–2.4)

## 2015-12-04 MED ORDER — MIDAZOLAM HCL 2 MG/2ML IJ SOLN
INTRAMUSCULAR | Status: AC
  Administered 2015-12-04: 16:00:00
  Filled 2015-12-04: qty 2

## 2015-12-04 MED ORDER — MIDAZOLAM HCL 2 MG/2ML IJ SOLN
2.0000 mg | Freq: Once | INTRAMUSCULAR | Status: AC
  Administered 2015-12-04: 2 mg via INTRAMUSCULAR

## 2015-12-04 MED ORDER — SODIUM CHLORIDE 3 % IV SOLN
INTRAVENOUS | Status: DC
  Administered 2015-12-04: 50 mL/h via INTRAVENOUS
  Filled 2015-12-04 (×2): qty 500

## 2015-12-04 NOTE — Progress Notes (Signed)
Dr. Joseph ArtWoods text paged critical value for sodium of 117.  Still awaiting to hear back from PICC team.

## 2015-12-04 NOTE — Procedures (Signed)
After explaining the risk and benefits of placing central venous line to her Son and answering all questions a signed consent was obtained. Patient received 2 mg Versed IM prior to starting procedure. A timeout was conducted to verify site of placement (left IJ), site was prepared and a sterile fashion and line was placed using ultrasound. Placement of left IJ was verified with x-ray prior to initiating use of line. Patient tolerated procedure well with minimal blood loss.

## 2015-12-04 NOTE — Progress Notes (Addendum)
Peripherally Inserted Central Catheter   The IV Nurse has discussed with the patient and/or persons authorized to consent for the patient, the purpose of this procedure and the potential benefits and risks involved with this procedure.  The benefits include less needle sticks, lab draws from the catheter and patient may be discharged home with the catheter.  Risks include, but not limited to, infection, bleeding, blood clot (thrombus formation), and puncture of an artery; nerve damage and irregular heat beat.  Alternatives to this procedure were also discussed.  Consent signed by son.  PICC/Midline Placement Documentation  PICC / Midline Double Lumen 01/19/14 PICC Right Cephalic 37 cm (Active)   Both arms assessed for PICC placement.  No suitable veins found for placement.  Rt. Basilic; cephalic and brachial veins PICC would occupy over 50% of vein.  Lt. Basilic, cephalic vein PICC would occupy over 100%.  Lt. Brachial vein may be of good size but is up against artery and therefore not suitable for bedside insertion.  Dr. Joseph ArtWoods notified.    Ethelda ChickCurrie, Bassem Bernasconi Robert 12/04/2015, 11:44 AM

## 2015-12-04 NOTE — Progress Notes (Signed)
[facility] - Stepdown/ICU TEAM Progress Note  Tammy Fernandez ZOX:[id number] DOB: 1924-11-10 DOA: 11/30/2015 PCP: Tammy Patella, MD  Admit HPI / Brief Narrative: Tammy Fernandez is a 82 y.o. BF PMHx Anxiety, Abnormal Heart Rhythms HLD, HTN, Chronic Respiratory Failure on home  2 L O2, CVA 2015  Brought to [facility] ED 12/28 due to being altered and less interactive compared to usual. Per her son, she has apparently been having vomiting and diarrhea which has intermittently been happening since around thanksgiving (home health RN apparently tested her for C.diff which was negative). Since christmas, she began to have tremor like movements as well and felt as if she was dizzy whenever she would ambulate. Over the past few days, her confusion had worsened and she has been feeling weaker. This prompted son to bring her to ED for further evaluation.  In ED, she was found to be hyponatremic with Na of 111. CT of the head was negative for acute process. TRH was called for admission but felt pt should be admitted to [facility]my Fernandez service.  HPI/Subjective: Fernandez/Fernandez  A/O Fernandez (does not know where, when why). Did awaken patient from sleep. However early in the day patient was combative to our IV lines and IV team was unable to place new peripheral IVs or a PICC line..    Assessment/Plan: Hyponatremia  - likely due to volume depletion from her vomiting and diarrhea on and off x Fernandez month. -Labs not consistent with SIADH. Normal urine sodium, normal urine osmolality -Review of Epic shows patient has been hyponatremic in the mid -high 120s since November -NS @ 78ml/hr for gradual correction of Na  -DC NaCl tablets; now with CVL  3% saline at 50 ml/hr x 10 hrs   Hyperkalemia  - resolved.  AMS  -Most likely secondary to hyponatremia,however still confused and combative -Obtain MRI CVA? -See hyponatremia  Anxiety -Patient on Xanax at home would like to avoid using secondary to her AMS. -Would not start  SSRI as can cause/exacerbate SIADH  Acute on chronic hypoxic respiratory failure  - she was admitted in Nov 2016 for CAP and has required supplemental O2 since then. -Continue supplemental O2 as needed to maintain SpO2 > 92%. -Albuterol PRN.  HTN -controlled -Increase amlodipine 10 mg daily -Continue lisinopril 5 mg daily -Propranolol 60 mg daily  Hypothyroidism. Continue synthroid 50 g daily. -Low TSH. -Free T4; slightly elevated will not make changes at this time  Chronic Diarrhea (diarrhea greater than 3 weeks)  -Obtain Stool osmolality -Obtain stool sodium/potassium -Obtain GI pathogen panel -Obtain occult blood -Obtain Fecal lactoferrin -Obtain Fecal fat -12/31 diarrhea resolved above tests not sent  Chronic Hematuria/UTI positive GNR -Patient one year ago also had hematuria. Previously evaluated? -Urine culture -12/31 Urinalysis c/w a UTI will start on ceftriaxone until sensitivities return.  Deconditioning -PT/OT consult; Patient has been in the Fernandez 2 times within the last 30 days, significantly deconditioned. Some residual weakness from CVA in 2015 evaluate for CIR vs SNF   Code Status: FULL Family Communication: Family present at time of exam Disposition Plan: Resolution hyponatremia/diarrhea    Consultants:   Procedure/Significant Events: Fernandez/Fernandez Tammy Fernandez placed left IJ   Culture 12/20 GI pathogen panel not collected 12/30 urine negative final 12/31 urine positive GNR   Antibiotics: Ceftriaxone 12/31>>   DVT prophylaxis: Subcutaneous heparin   Devices    LINES / TUBES:      Continuous Infusions: . sodium chloride Stopped (12/04/15 0015)    Objective:  VITAL SIGNS: Temp: 97.8 F (36.6 C) (01/01 1310) Temp Source: Axillary (01/01 1310) BP: 168/63 mmHg (01/01 1310) Pulse Rate: 74 (01/01 0839) SPO2; FIO2:   Intake/Output Summary (Last 24 hours) at 12/04/15 1524 Last data filed at 12/04/15 0900  Gross per 24 hour    Intake   1935 ml  Output      0 ml  Net   1935 ml     Exam: General:A/O 0 (does not know who, where, when why).  Eyes: negative scleral hemorrhage ENT: Negative Runny nose, negative gingival bleeding, Neck:  Negative scars, masses, torticollis, lymphadenopathy, JVD Lungs: Clear to auscultation bilaterally without wheezes or crackles Cardiovascular: Regular rate and rhythm without murmur gallop or rub normal S1 and S2 Abdomen:negative abdominal pain, nondistended, positive soft, bowel sounds, no rebound, no ascites, no appreciable mass Extremities: No significant cyanosis, clubbing, or edema bilateral lower extremities Psychiatric:  Unable to fully assess secondary to altered mental status  Neurologic:  Unable to fully assess secondary to altered mental status .   Data Reviewed: Basic Metabolic Panel:  Recent Labs Lab 12/01/15 0231 12/01/15 1200 12/02/15 0957 12/03/15 1050 12/04/15 0840  NA 113* 115* 117* 118* 117*  K 5.5* 5.Fernandez 4.3 4.8 3.9  CL 74* 75* 79* 79* 75*  CO2 30 32 32 35* 34*  GLUCOSE 92 93 182* 136* 118*  BUN 15 13 11 10 7   CREATININE 0.63 0.63 0.58 0.61 0.43*  CALCIUM 9.5 9.3 9.Fernandez 8.8* 9.0  MG 2.0 Fernandez.8 Fernandez.7 2.2 Fernandez.7  PHOS 3.3  --   --   --   --    Liver Function Tests:  Recent Labs Lab 11/30/15 1815 12/01/15 1200 12/02/15 0957  AST 46* 73* 68*  ALT 15 15 16   ALKPHOS 87 84 86  BILITOT Fernandez.3* Fernandez.0 0.8  PROT 7.3 6.6 6.8  ALBUMIN 3.4* 3.0* 3.0*   No results for input(s): LIPASE, AMYLASE in the last 168 hours.  Recent Labs Lab 11/30/15 1420  AMMONIA 59*   CBC:  Recent Labs Lab 11/30/15 1420  11/30/15 1941 12/01/15 0231 12/01/15 1200 12/02/15 0957 12/03/15 0840  WBC 10.3  --   --  11.Fernandez* 10.4 9.4 10.Fernandez  NEUTROABS 7.7  --   --   --   --  6.9 6.8  HGB 11.2*  < > 15.0 11.5* 11.4* 11.8* 10.7*  HCT 33.6*  < > 44.0 34.7* 33.8* 36.3 33.4*  MCV 82.4  --   --  82.2 82.2 84.0 84.3  PLT 236  --   --  267 251 267 263  < > = values in this interval not  displayed. Cardiac Enzymes:  Recent Labs Lab 11/30/15 1427  TROPONINI <0.03   BNP (last 3 results)  Recent Labs  10/29/15 1319  BNP 53.8    ProBNP (last 3 results) No results for input(s): PROBNP in the last 8760 hours.  CBG: No results for input(s): GLUCAP in the last 168 hours.  Recent Results (from the past 240 hour(s))  Culture, Urine     Status: None   Collection Time: 12/02/15  9:04 AM  Result Value Ref Range Status   Specimen Description URINE, CATHETERIZED  Final   Special Requests NONE  Final   Culture NO GROWTH Fernandez DAY  Final   Report Status 12/03/2015 FINAL  Final  Culture, Urine     Status: None (Preliminary result)   Collection Time: 12/03/15  8:58 AM  Result Value Ref Range Status   Specimen Description URINE, RANDOM  Final   Special Requests NONE  Final   Culture >=100,000 COLONIES/mL GRAM NEGATIVE RODS  Final   Report Status PENDING  Incomplete     Studies:  Recent x-ray studies have been reviewed in detail by the Attending Physician  Scheduled Meds:  Scheduled Meds: . amLODipine  10 mg Oral Daily  . aspirin EC  81 mg Oral Daily  . cefTRIAXone (ROCEPHIN)  IV  2 g Intravenous Q24H  . famotidine  20 mg Oral Daily  . heparin  5,000 Units Subcutaneous 3 times per day  . levothyroxine  50 mcg Oral QAC breakfast  . lisinopril  5 mg Oral Daily  . midazolam  2 mg Intramuscular Once  . olopatadine  Fernandez drop Both Eyes Daily  . pantoprazole  40 mg Oral Daily  . propranolol ER  60 mg Oral Daily  . sodium chloride  Fernandez,000 mL Intravenous Once  . sodium chloride  2 g Oral TID WC    Time spent on care of this patient: 40 mins   Claryssa Sandner, Roselind MessierURTIS J , MD  [facility] Office  [phone] Pager - [phone]  On-Call/Text Page:      [email or web address]      password [email or web address]  If 7PM-7AM, please contact night-coverage [email or web address] Password [email or web address] Fernandez/[email or web address], 3:24 PM   LOS: 4 days   Care during the described time interval was provided by me .  I have reviewed  this patient's available data, including medical history, events of note, physical examination, and all test results as part of my evaluation. I have personally reviewed and interpreted all radiology studies.   Carolyne Littlesurtis Marquerite Forsman, MD [phone] Pager

## 2015-12-04 NOTE — Progress Notes (Signed)
Dr. Joseph ArtWoods notified via text page that we are awaiting PICC placement and pt not swallowing pills.

## 2015-12-04 NOTE — Progress Notes (Signed)
Walked into pt's room at 0015 after receiving report and noted that pt's PIV was removed and laying beside pt on bed while infusing NS. PIV had been removed by pt at unknown time. Pt had no other IV access. Placed order for IV team to insert new line due to pt being a difficult start. IV team promptly arrived and attempted to place an IV for approximately 45 minutes. At least 2 attempts were performed, and ultrasound was used. IV team unable to place IV. NP on-call notified of situation who then placed order to insert PICC line. NP aware that pt no longer has IV access at this time. Will continue to monitor. Gregor HamsBINGHAM, Maxwel Meadowcroft, RN

## 2015-12-05 ENCOUNTER — Inpatient Hospital Stay (HOSPITAL_COMMUNITY): Payer: Medicare Other

## 2015-12-05 LAB — BASIC METABOLIC PANEL
Anion gap: 7 (ref 5–15)
Anion gap: 7 (ref 5–15)
Anion gap: 7 (ref 5–15)
Anion gap: 10 (ref 5–15)
BUN: 7 mg/dL (ref 6–20)
BUN: 8 mg/dL (ref 6–20)
BUN: 8 mg/dL (ref 6–20)
BUN: 6 mg/dL (ref 6–20)
CALCIUM: 9.2 mg/dL (ref 8.9–10.3)
CO2: 41 mmol/L — AB (ref 22–32)
CO2: 41 mmol/L — ABNORMAL HIGH (ref 22–32)
CO2: 40 mmol/L — ABNORMAL HIGH (ref 22–32)
CO2: 42 mmol/L — ABNORMAL HIGH (ref 22–32)
CREATININE: 0.47 mg/dL (ref 0.44–1.00)
Calcium: 8.9 mg/dL (ref 8.9–10.3)
Calcium: 9 mg/dL (ref 8.9–10.3)
Calcium: 9.4 mg/dL (ref 8.9–10.3)
Chloride: 76 mmol/L — ABNORMAL LOW (ref 101–111)
Chloride: 77 mmol/L — ABNORMAL LOW (ref 101–111)
Chloride: 79 mmol/L — ABNORMAL LOW (ref 101–111)
Chloride: 76 mmol/L — ABNORMAL LOW (ref 101–111)
Creatinine, Ser: 0.48 mg/dL (ref 0.44–1.00)
Creatinine, Ser: 0.56 mg/dL (ref 0.44–1.00)
Creatinine, Ser: 0.49 mg/dL (ref 0.44–1.00)
GFR calc Af Amer: >60 mL/min (ref >60)
GFR calc Af Amer: >60 mL/min (ref >60)
GFR calc Af Amer: >60 mL/min (ref >60)
GFR calc Af Amer: >60 mL/min (ref >60)
GFR calc non Af Amer: >60 mL/min (ref >60)
GFR calc non Af Amer: >60 mL/min (ref >60)
GLUCOSE: 103 mg/dL — AB (ref 65–99)
Glucose, Bld: 107 mg/dL — ABNORMAL HIGH (ref 65–99)
Glucose, Bld: 88 mg/dL (ref 65–99)
Glucose, Bld: 123 mg/dL — ABNORMAL HIGH (ref 65–99)
Potassium: 3.6 mmol/L (ref 3.5–5.1)
Potassium: 3.8 mmol/L (ref 3.5–5.1)
Potassium: 3.8 mmol/L (ref 3.5–5.1)
Potassium: 3.4 mmol/L — ABNORMAL LOW (ref 3.5–5.1)
SODIUM: 126 mmol/L — AB (ref 135–145)
Sodium: 124 mmol/L — ABNORMAL LOW (ref 135–145)
Sodium: 125 mmol/L — ABNORMAL LOW (ref 135–145)
Sodium: 128 mmol/L — ABNORMAL LOW (ref 135–145)

## 2015-12-05 LAB — URINE CULTURE: Culture: >=100000

## 2015-12-05 LAB — MAGNESIUM: MAGNESIUM: 1.7 mg/dL (ref 1.7–2.4)

## 2015-12-05 MED ORDER — DEXTROSE 5 % IV SOLN
1.0000 g | INTRAVENOUS | Status: DC
  Administered 2015-12-06 – 2015-12-08 (×3): 1 g via INTRAVENOUS
  Filled 2015-12-05 (×4): qty 10

## 2015-12-05 MED ORDER — HALOPERIDOL LACTATE 5 MG/ML IJ SOLN
2.0000 mg | Freq: Four times a day (QID) | INTRAMUSCULAR | Status: DC | PRN
  Administered 2015-12-06 – 2015-12-07 (×2): 2 mg via INTRAVENOUS
  Filled 2015-12-05 (×2): qty 1

## 2015-12-05 MED ORDER — LORAZEPAM 2 MG/ML IJ SOLN
0.5000 mg | Freq: Two times a day (BID) | INTRAMUSCULAR | Status: DC
  Administered 2015-12-05: 0.5 mg via INTRAVENOUS
  Filled 2015-12-05 (×2): qty 1

## 2015-12-05 MED ORDER — ALBUTEROL SULFATE (2.5 MG/3ML) 0.083% IN NEBU
2.5000 mg | INHALATION_SOLUTION | RESPIRATORY_TRACT | Status: DC | PRN
  Filled 2015-12-05: qty 3

## 2015-12-05 NOTE — Progress Notes (Signed)
[place] TEAM 1 - Stepdown/ICU TEAM PROGRESS NOTE  Tammy Fernandez ZOX:[id number] DOB: 06-06-1924 DOA: 11/30/2015 PCP: [name], MD  Admit HPI / Brief Narrative: 82 yo F Hx Anxiety, Abnormal Heart Rhythms, HLD, HTN, Chronic Respiratory Failure on home2 L O2, and CVA 2015 who was brought to [facility] ED 12/28 due to being altered and less interactive.  She had been having vomiting and diarrhea intermittently since late November (home health RN apparently tested her for C.diff which was negative). She began to have tremor like movements as well and felt dizzy whenever she would ambulate.  Her confusion worsened and this prompted her son to bring her to ED.  In the ED she was found to be hyponatremic with Na of 111. CT of the head was negative for acute process.  HPI/Subjective: The pt is talking nonsensically, non-stop.  She is in no apparent acute distress.  She can not provide a ROS.      Assessment/Plan:  Hyponatremia  -likely due to volume depletion from her vomiting and diarrhea on and off x 1 month -labs not consistent with SIADH -Na during hospital stay in November 2016 was 126 at admit, and 134 at time of d/c   Hyperkalemia -resolved  AMS  -secondary to hyponatremia +/- ICU delirium/sundowning + UTI - use sedatives sparingly, but pt will need something to help her sleep tonight - as pt is on chronic benzos, will provide low dose ativan to prevent withdrawal   Klebsiella UTI -cont directed abx tx - this is likely the cause of her diarrhea   Acute on chronic hypoxic respiratory failure  -she was admitted in Nov 2016 for CAP and has required supplemental O2 since  -continue supplemental O2 as needed to maintain SpO2 > 92%.  HTN -likely somewhat driven by agitation today - avoid changing BP meds for now and tx agitation   Hypothyroidism. -Low TSH - Free T4 slightly elevated - will not make changes at this time - cont synthroid  Hematuria -had hematuria one  year ago - presently is likely related to UTI - will need f/u UA once UTI cleared  Deconditioning -PT/OT will be needed when mental status allows   Code Status: FULL Family Communication: spoke w/ multiple family members at bedside  Disposition Plan: SDU   Consultants: PCCM  Procedures: none  Antibiotics: Rocephin 12/31 >  DVT prophylaxis: SQ heparin   Objective: Blood pressure 166/67, pulse 73, temperature 98.1 F (36.7 C), temperature source Axillary, resp. rate 34, height 5\' 2"  (1.575 m), weight 91.2 kg (201 lb 1 oz), SpO2 94 %.  Intake/Output Summary (Last 24 hours) at 12/05/15 1839 Last data filed at 12/05/15 1200  Gross per 24 hour  Intake 769.17 ml  Output      0 ml  Net 769.17 ml   Exam: General: No acute respiratory distress Lungs: Clear to auscultation bilaterally without wheezes or crackles Cardiovascular: Regular rate and rhythm without murmur gallop or rub normal S1 and S2 Abdomen: Nontender, nondistended, soft, bowel sounds positive, no rebound, no ascites, no appreciable mass Extremities: No significant cyanosis, clubbing, or edema bilateral lower extremities  Data Reviewed:  Basic Metabolic Panel:  Recent Labs Lab 12/01/15 0231 12/01/15 1200 12/02/15 0957 12/03/15 1050 12/04/15 0840 12/04/15 2110 12/04/15 2328 12/05/15 0204 12/05/15 0506 12/05/15 1637  NA 113* 115* 117* 118* 117* 122* 124* 125* 126* 128*  K 5.5* 5.1 4.3 4.8 3.9 3.4* 3.6 3.8 3.8 3.4*  CL 74* 75* 79* 79* 75* 75* 76*  77* 79* 76*  CO2 30 32 32 35* 34* 41* 41* 41* 40* 42*  GLUCOSE 92 93 182* 136* 118* 112* 103* 107* 88 123*  BUN 15 13 11 10 7 8 7 8 8 6   CREATININE 0.63 0.63 0.58 0.61 0.43* 0.58 0.48 0.56 0.47 0.49  CALCIUM 9.5 9.3 9.1 8.8* 9.0 9.1 9.2 8.9 9.0 9.4  MG 2.0 1.8 1.7 2.2 1.7  --   --   --  1.7  --   PHOS 3.3  --   --   --   --   --   --   --   --   --     CBC:  Recent Labs Lab 11/30/15 1420  11/30/15 1941 12/01/15 0231 12/01/15 1200 12/02/15 0957  12/03/15 0840  WBC 10.3  --   --  11.1* 10.4 9.4 10.1  NEUTROABS 7.7  --   --   --   --  6.9 6.8  HGB 11.2*  < > 15.0 11.5* 11.4* 11.8* 10.7*  HCT 33.6*  < > 44.0 34.7* 33.8* 36.3 33.4*  MCV 82.4  --   --  82.2 82.2 84.0 84.3  PLT 236  --   --  267 251 267 263  < > = values in this interval not displayed.  Liver Function Tests:  Recent Labs Lab 11/30/15 1815 12/01/15 1200 12/02/15 0957  AST 46* 73* 68*  ALT 15 15 16   ALKPHOS 87 84 86  BILITOT 1.3* 1.0 0.8  PROT 7.3 6.6 6.8  ALBUMIN 3.4* 3.0* 3.0*    Recent Labs Lab 11/30/15 1420  AMMONIA 59*    Coags:  Recent Labs Lab 11/30/15 1427  INR 1.03    Recent Labs Lab 11/30/15 1427  APTT 34    Cardiac Enzymes:  Recent Labs Lab 11/30/15 1427  TROPONINI <0.03     Recent Results (from the past 240 hour(s))  Culture, Urine     Status: None   Collection Time: 12/02/15  9:04 AM  Result Value Ref Range Status   Specimen Description URINE, CATHETERIZED  Final   Special Requests NONE  Final   Culture NO GROWTH 1 DAY  Final   Report Status 12/03/2015 FINAL  Final  Culture, Urine     Status: None   Collection Time: 12/03/15  8:58 AM  Result Value Ref Range Status   Specimen Description URINE, RANDOM  Final   Special Requests NONE  Final   Culture >=100,000 COLONIES/mL KLEBSIELLA PNEUMONIAE  Final   Report Status 12/05/2015 FINAL  Final   Organism ID, Bacteria KLEBSIELLA PNEUMONIAE  Final      Susceptibility   Klebsiella pneumoniae - MIC*    AMPICILLIN 16 RESISTANT Resistant     CEFAZOLIN <=4 SENSITIVE Sensitive     CEFTRIAXONE <=1 SENSITIVE Sensitive     CIPROFLOXACIN <=0.25 SENSITIVE Sensitive     GENTAMICIN <=1 SENSITIVE Sensitive     IMIPENEM <=0.25 SENSITIVE Sensitive     NITROFURANTOIN 32 SENSITIVE Sensitive     TRIMETH/SULFA <=20 SENSITIVE Sensitive     AMPICILLIN/SULBACTAM <=2 SENSITIVE Sensitive     PIP/TAZO <=4 SENSITIVE Sensitive     * >=100,000 COLONIES/mL KLEBSIELLA PNEUMONIAE      Studies:   Recent x-ray studies have been reviewed in detail by the Attending Physician  Scheduled Meds:  Scheduled Meds: . amLODipine  10 mg Oral Daily  . aspirin EC  81 mg Oral Daily  . [START ON 12/06/2015] cefTRIAXone (ROCEPHIN)  IV  1 g  Intravenous Q24H  . famotidine  20 mg Oral Daily  . heparin  5,000 Units Subcutaneous 3 times per day  . levothyroxine  50 mcg Oral QAC breakfast  . lisinopril  5 mg Oral Daily  . olopatadine  1 drop Both Eyes Daily  . pantoprazole  40 mg Oral Daily  . propranolol ER  60 mg Oral Daily  . sodium chloride  1,000 mL Intravenous Once    Time spent on care of this patient: 35 mins   MCCLUNG,JEFFREY T , MD   [facility] Office  [phone] Pager - Text Page per Loretha StaplerAmion as per below:  On-Call/Text Page:      [email or web address]      password [email or web address]  If 7PM-7AM, please contact night-coverage [email or web address] Password [email or web address] 12/05/2015, 6:39 PM   LOS: 5 days

## 2015-12-05 NOTE — Evaluation (Signed)
Occupational Therapy Evaluation Patient Details Name: Tammy BeachRosa L Hott MRN: [id number] DOB: [birth year] Today's Date: 12/05/2015    History of Present Illness Pt is a 82 y/o F admitted due to AMS and less interactive as usual.  CT of head negative for acute process.  Dx: hyponatremia.  Pt's PMH includes CVA in 2015, anxiety.   Clinical Impression   Pt admitted with above. She demonstrates the below listed deficits and will benefit from continued OT to maximize safety and independence with BADLs.  Currently, pt only follows occasional one step commands and is unable to engage in ADL tasks - she requires total A overall, and max A for functional mobility.   She lives with her children who can provide assist at home, and they prefer to take her home at discharge, but are open to post acute rehab.  She went to CIR after CVA in 2015, and son was inquiring about that option.  Based on today's status, SNF would be the recommendation, but as pt improves, that may change.  Will follow acutely.       Follow Up Recommendations  SNF;Supervision/Assistance - 24 hour    Equipment Recommendations  None recommended by OT    Recommendations for Other Services       Precautions / Restrictions Precautions Precautions: Fall      Mobility Bed Mobility Overal bed mobility: Needs Assistance Bed Mobility: Supine to Sit;Sit to Supine     Supine to sit: Max assist Sit to supine: Mod assist   General bed mobility comments: Pt spontaneously laid down, but required assist for LEs and for positioning   Transfers                 General transfer comment: unable to safely perform this date     Balance Overall balance assessment: Needs assistance Sitting-balance support: Feet supported Sitting balance-[name]: Poor Sitting balance - Comments: requires close min guard to min A to sit EOB                                     ADL Overall ADL's : Needs  assistance/impaired Eating/Feeding: Total assistance   Grooming: Total assistance;Bed level   Upper Body Bathing: Total assistance;Bed level   Lower Body Bathing: Total assistance;Bed level   Upper Body Dressing : Total assistance;Bed level   Lower Body Dressing: Total assistance;Bed level   Toilet Transfer: Total assistance Toilet Transfer Details (indicate cue type and reason): unable to safely perform  Toileting- Clothing Manipulation and Hygiene: Total assistance;Bed level       Functional mobility during ADLs: Total assistance General ADL Comments: Pt unable to engage in ADL tasks this date      Vision     Perception     Praxis      Pertinent Vitals/Pain Pain Assessment: No/denies pain     Hand Dominance Right   Extremity/Trunk Assessment Upper Extremity Assessment Upper Extremity Assessment: Generalized weakness   Lower Extremity Assessment Lower Extremity Assessment: Defer to PT evaluation       Communication Communication Communication: No difficulties   Cognition Arousal/Alertness: Awake/alert Behavior During Therapy: Flat affect (verbally perseverative ) Overall Cognitive Status: Impaired/Different from baseline Area of Impairment: Orientation;Attention;Following commands Orientation Level: Disoriented to;Place;Time;Situation Current Attention Level: Focused   Following Commands: Follows one step commands inconsistently       General Comments: Pt talks incessently with non sensical and perseverative language.  She is able to state her name, and recognizes her sons with cues.  SHe iniitally misidentified them, but on second and third attempts she was able to state their names    General Comments       Exercises       Shoulder Instructions      Home Living Family/patient expects to be discharged to:: Private residence Living Arrangements: Children (lives w/ daughter) Available Help at Discharge: Family;Available 24 hours/day Type of  Home: House Home Access: Stairs to enter [employer] of Steps: 4 Entrance Stairs-Rails: Right Home Layout: One level     Bathroom Shower/Tub: Tub/shower unit Shower/tub characteristics: Engineer, building services: Standard     Home Equipment: Bedside commode;Wheelchair - manual;Cane - single point;Tub bench   Additional Comments: daughter and son live with her, someone with her 24/7      Prior Functioning/Environment Level of Independence: Needs assistance  Gait / Transfers Assistance Needed: Short distances in home w/o AD, until recently pt unable to ambulate. ADL's / Homemaking Assistance Needed: daughter assists w/ sponge bath and dressing        OT Diagnosis: Generalized weakness;Cognitive deficits;Altered mental status   OT Problem List: Decreased strength;Decreased activity tolerance;Impaired balance (sitting and/or standing);Decreased cognition;Decreased safety awareness;Decreased knowledge of use of DME or AE;Obesity   OT Treatment/Interventions: Self-care/ADL training;Therapeutic exercise;DME and/or AE instruction;Therapeutic activities;Cognitive remediation/compensation;Patient/family education;Balance training    OT Goals(Current goals can be found in the care plan section) Acute Rehab OT Goals Patient Stated Goal: For pt to return to her normal  OT Goal Formulation: With family Time For Goal Achievement: 12/19/15 Potential to Achieve Goals: Good ADL Goals Pt Will Perform Eating: with supervision;sitting Pt Will Perform Grooming: with supervision;sitting Pt Will Perform Upper Body Bathing: with mod assist;sitting Pt Will Transfer to Toilet: with mod assist;stand pivot transfer;bedside commode Pt Will Perform Toileting - Clothing Manipulation and hygiene: with mod assist;sit to/from stand Additional ADL Goal #1: Pt will follow one step commands consistently   OT Frequency: Min 2X/week   Barriers to D/C:            Co-evaluation               End of Session Nurse Communication: Mobility status  Activity Tolerance: Patient limited by fatigue Patient left: in bed;with call bell/phone within reach;with bed alarm set   Time: 1610-9604 OT Time Calculation (min): 35 min Charges:  OT General Charges $OT Visit: 1 Procedure OT Evaluation $Initial OT Evaluation Tier I: 1 Procedure OT Treatments $Therapeutic Activity: 8-22 mins G-Codes:    Leamon Palau M 12/28/2015, 4:29 PM

## 2015-12-05 NOTE — Care Management Important Message (Signed)
Important Message  Patient Details  Name: Tammy Fernandez MRN: [id number] Date of Birth: 1924/04/04   Medicare Important Message Given:  Yes    Rayvon CharSTUTTS, Yanely Mast G 12/05/2015, 12:24 PMImportant Message  Patient Details  Name: Tammy Fernandez MRN: [id number] Date of Birth: 1924/04/04   Medicare Important Message Given:  Yes    Shad Ledvina G 12/05/2015, 12:24 PM

## 2015-12-06 DIAGNOSIS — E876 Hypokalemia: Secondary | ICD-10-CM | POA: Diagnosis present

## 2015-12-06 LAB — BASIC METABOLIC PANEL
ANION GAP: 4 — AB (ref 5–15)
BUN: 5 mg/dL — ABNORMAL LOW (ref 6–20)
CALCIUM: 9 mg/dL (ref 8.9–10.3)
CO2: 44 mmol/L — AB (ref 22–32)
Chloride: 78 mmol/L — ABNORMAL LOW (ref 101–111)
Creatinine, Ser: 0.66 mg/dL (ref 0.44–1.00)
Glucose, Bld: 108 mg/dL — ABNORMAL HIGH (ref 65–99)
Potassium: 3 mmol/L — ABNORMAL LOW (ref 3.5–5.1)
Sodium: 126 mmol/L — ABNORMAL LOW (ref 135–145)

## 2015-12-06 LAB — SODIUM
SODIUM: 127 mmol/L — AB (ref 135–145)
SODIUM: 127 mmol/L — AB (ref 135–145)
Sodium: 128 mmol/L — ABNORMAL LOW (ref 135–145)

## 2015-12-06 LAB — CBC
HCT: 30.2 % — ABNORMAL LOW (ref 36.0–46.0)
HEMOGLOBIN: 9.5 g/dL — AB (ref 12.0–15.0)
MCH: 26.5 pg (ref 26.0–34.0)
MCHC: 31.5 g/dL (ref 30.0–36.0)
MCV: 84.4 fL (ref 78.0–100.0)
Platelets: 259 10*3/uL (ref 150–400)
RBC: 3.58 MIL/uL — AB (ref 3.87–5.11)
RDW: 13.8 % (ref 11.5–15.5)
WBC: 11.8 10*3/uL — AB (ref 4.0–10.5)

## 2015-12-06 LAB — MRSA PCR SCREENING: MRSA BY PCR: NEGATIVE

## 2015-12-06 MED ORDER — LORAZEPAM 2 MG/ML IJ SOLN
0.5000 mg | Freq: Two times a day (BID) | INTRAMUSCULAR | Status: DC | PRN
  Administered 2015-12-06 – 2015-12-08 (×2): 0.5 mg via INTRAVENOUS
  Filled 2015-12-06 (×3): qty 1

## 2015-12-06 MED ORDER — SODIUM CHLORIDE 0.9 % IJ SOLN
10.0000 mL | Freq: Two times a day (BID) | INTRAMUSCULAR | Status: DC
  Administered 2015-12-06 (×2): 20 mL
  Administered 2015-12-07: 10 mL

## 2015-12-06 MED ORDER — SODIUM CHLORIDE 0.9 % IJ SOLN: 10.0000 mL | INTRAMUSCULAR | Status: DC | PRN

## 2015-12-06 MED ORDER — SODIUM CHLORIDE 3 % IV SOLN
INTRAVENOUS | Status: DC
  Administered 2015-12-06: 50 mL/h via INTRAVENOUS
  Filled 2015-12-06: qty 500

## 2015-12-06 MED ORDER — POTASSIUM CHLORIDE 10 MEQ/50ML IV SOLN
10.0000 meq | INTRAVENOUS | Status: AC
  Administered 2015-12-06 (×4): 10 meq via INTRAVENOUS
  Filled 2015-12-06 (×5): qty 50

## 2015-12-06 MED ORDER — POTASSIUM CHLORIDE 10 MEQ/100ML IV SOLN
10.0000 meq | INTRAVENOUS | Status: DC
  Administered 2015-12-06: 10 meq via INTRAVENOUS
  Filled 2015-12-06 (×2): qty 100

## 2015-12-06 NOTE — Care Management Note (Signed)
Case Management Note  Patient Details  Name: Tammy Fernandez MRN: [id number] Date of Birth: May 07, 1924  Subjective/Objective:      Hyponatremia, UTI              Action/Plan:  NCM spoke to pt and dtr's boyfriend, Tammy NovemberMike at bedside. Pt gave permission to speak to Tammy NovemberMike, her [place], Tammy Fernandez # [phone]  or son, Tammy Fernandez # [phone]. Pt [facility]. States her children are able to provide 24 hour assistance for her at home. She has home oxygen, RW and bedside commode. She is requesting a hospital bed for home. Waiting final recommendations for home.    Expected Discharge Date:                 Expected Discharge Plan:  Home w Home Health Services  In-House Referral:  Clinical Social Work  Discharge planning Services  CM Consult  Post Acute Care Choice:  Resumption of Svcs/PTA Provider, Home Health Choice offered to:  Patient  DME Arranged:    DME Agency:     HH Arranged:  RN, PT HH Agency:  [facility]  Status of Service:  Completed, signed off  Medicare Important Message Given:  Yes Date Medicare IM Given:    Medicare IM give by:    Date Additional Medicare IM Given:    Additional Medicare Important Message give by:     If discussed at Long Length of Stay Meetings, dates discussed:    Additional Comments:  Tammy Fernandez, Tammy Merrick Ellen, RN 12/06/2015, 4:56 PM

## 2015-12-06 NOTE — Progress Notes (Signed)
Physical Therapy Treatment Patient Details Name: Tammy Fernandez MRN: [id number] DOB: 03/28/1924 Today's Date: 12/06/2015    History of Present Illness Pt is a 82 y/o F admitted due to AMS and less interactive as usual.  CT of head negative for acute process.  Dx: hyponatremia.  Pt's PMH includes CVA in 2015, anxiety.    PT Comments    Progressing slowly, today limited due to lethargy.  Emphasis on transitions, sitting and standing.  Back to bed due to clearly sleepy.  Follow Up Recommendations  SNF;Supervision/Assistance - 24 hour     Equipment Recommendations  Other (comment)    Recommendations for Other Services       Precautions / Restrictions Precautions Precautions: Fall    Mobility  Bed Mobility Overal bed mobility: Needs Assistance Bed Mobility: Supine to Sit;Sit to Supine     Supine to sit: Max assist;+2 for physical assistance;+2 for safety/equipment Sit to supine: Max assist;+2 for physical assistance;+2 for safety/equipment   General bed mobility comments: pt too lethargic to initiate movement  Transfers Overall transfer level: Needs assistance Equipment used: 2 person hand held assist Transfers: Sit to/from Stand Sit to Stand: Mod assist;+2 physical assistance         General transfer comment: assist and cues to guide step by step,  also side stepped to [facility].  Ambulation/Gait             General Gait Details: not able at this point   Stairs            Wheelchair Mobility    Modified Rankin (Stroke Patients Only)       Balance Overall balance assessment: Needs assistance Sitting-balance support: No upper extremity supported Sitting balance-[name]: Fair Sitting balance - Comments: sat without assist, but  would not be able to handle challenge.     Standing balance-[name]: Poor Standing balance comment: needed assist for stability; stood over 1 minute with assist during pericare.                    Cognition  Arousal/Alertness: Lethargic Behavior During Therapy: Flat affect Overall Cognitive Status: Impaired/Different from baseline Area of Impairment: Orientation;Attention;Following commands   Current Attention Level: Focused Memory: Decreased short-term memory Following Commands: Follows one step commands inconsistently Safety/Judgement: Decreased awareness of safety;Decreased awareness of deficits Awareness: Intellectual Problem Solving: Slow processing;Decreased initiation;Difficulty sequencing;Requires verbal cues;Requires tactile cues      Exercises      General Comments General comments (skin integrity, edema, etc.): pt too sleepy from being awake and talking constantly the day before to participate well.      Pertinent Vitals/Pain Pain Assessment: No/denies pain    Home Living                      Prior Function            PT Goals (current goals can now be found in the care plan section) Acute Rehab PT Goals Patient Stated Goal: For pt to return to her normal  PT Goal Formulation: With patient/family Time For Goal Achievement: 12/17/15 Potential to Achieve Goals: Good Progress towards PT goals: Not progressing toward goals - comment (too lethargic to participate well)    Frequency  Min 2X/week    PT Plan Current plan remains appropriate    Co-evaluation             End of Session Equipment Utilized During Treatment: Oxygen Activity Tolerance: Patient tolerated treatment well;Patient  limited by lethargy Patient left: in bed;with call bell/phone within reach;with restraints reapplied     Time: 1121-1150 PT Time Calculation (min) (ACUTE ONLY): 29 min  Charges:  $Therapeutic Activity: 23-37 mins                    G Codes:      Sinda Leedom, Eliseo GumKenneth V 12/06/2015, 4:32 PM 12/06/2015  [place] BingKen Destynie Toomey, PT [phone]  (pager)

## 2015-12-06 NOTE — Progress Notes (Signed)
Pt bladder scanned for post void residual for greater than 700 ml urine. MD notified order placed to insert urinary catheter for acute urinary retention. Per hospital catheter placement policy Patient tolerated without complaint.

## 2015-12-06 NOTE — Progress Notes (Signed)
[place] TEAM 1 - Stepdown/ICU TEAM Progress Note  Tammy BeachRosa L Kirkey WUJ:[id number] DOB: [birth year] DOA: 11/30/2015 PCP: [name],[name], MD  Admit HPI / Brief Narrative: Tammy Fernandez is a 82 y.o. BF PMHx Anxiety, Abnormal Heart Rhythms HLD, HTN, Chronic Respiratory Failure on home  2 L O2, CVA 2015  [facility] due to being altered and less interactive compared to usual. Per her son, she has apparently been having vomiting and diarrhea which has intermittently been happening since around thanksgiving (home health RN apparently tested her for C.diff which was negative). Since christmas, she began to have tremor like movements as well and felt as if she was dizzy whenever she would ambulate. Over the past few days, her confusion had worsened and she has been feeling weaker. This prompted son to bring her to ED for further evaluation.  In ED, she was found to be hyponatremic with Na of 111. CT of the head was negative for acute process. [facility] was called for admission but [facility]  HPI/Subjective: 1/3  A/O 3 (does not know why). Continues to be highly confused. Was able to identify several of her family members present in room.    Assessment/Plan: Hyponatremia  - likely due to volume depletion from her vomiting and diarrhea on and off x 1 month. -Labs not consistent with SIADH. Normal urine sodium, normal urine osmolality -Review of Epic shows patient has been hyponatremic in the mid -high 120s since November -3% saline 8 hours; q 2hr Serum Na check  Hyperkalemia/Hypokalemia  -Potassium IV 50 mEq.  AMS  -Most likely multifactorial to include UTI, hyponatremia, hospital delirium. -Most likely secondary to hyponatremia,however still confused and combative -Brain MRI; nondiagnostic for cause of continued altered mental status -Minimize sedating/mind altering medication. Changed Ativan to PRN. Patient had not been on scheduled  benzodiazepine since admission.  -Continue Haldol PRN -See hyponatremia  Anxiety -Patient on Xanax at home would like to avoid using secondary to her AMS. -See AMS -Would not start SSRI as can cause/exacerbate SIADH  Acute on chronic hypoxic respiratory failure  - she was admitted in Nov 2016 for CAP and has required supplemental O2 since then. -Continue supplemental O2 as needed to maintain SpO2 > 92%. -Albuterol PRN.  HTN -controlled -Continue amlodipine 10 mg daily -Continue lisinopril 5 mg daily -Propranolol 60 mg daily  Hypothyroidism. Continue synthroid 50 g daily. -Low TSH. -Free T4; slightly elevated will not make changes at this time  Chronic Diarrhea (diarrhea greater than 3 weeks)  -Obtain Stool osmolality -Obtain stool sodium/potassium -Obtain GI pathogen panel -Obtain occult blood -Obtain Fecal lactoferrin -Obtain Fecal fat -12/31 diarrhea resolved above tests not sent  Chronic Hematuria/UTI positive Klebsiella  -Patient one year ago also had hematuria. Previously evaluated? -Continue Ceftriaxone and complete 5 day course.   Deconditioning -PT/OT consult; Patient has been in the hospital 2 times within the last 30 days, significantly deconditioned. Some residual weakness from CVA in 2015 evaluate for CIR vs SNF   Code Status: FULL Family Communication: Family present at time of exam Disposition Plan: Resolution hyponatremia/diarrhea--> SNF    Consultants:   Procedure/Significant Events: 1/1 [facility] placed left IJ   Culture 12/20 GI pathogen panel not collected 12/30 urine negative final 12/31 urine positive GNR   Antibiotics: Ceftriaxone 12/31>>   DVT prophylaxis: Subcutaneous heparin   Devices    LINES / TUBES:      Continuous Infusions: . sodium chloride 30 mL/hr  at 12/06/15 0700    Objective: VITAL SIGNS: Temp: 98 F (36.7 C) (01/03 0313) Temp Source: Axillary (01/03 0313) BP: 138/63 mmHg (01/03  0704) Pulse Rate: 65 (01/03 0704) SPO2; FIO2:   Intake/Output Summary (Last 24 hours) at 12/06/15 0741 Last data filed at 12/06/15 0600  Gross per 24 hour  Intake 1268.33 ml  Output   1000 ml  Net 268.33 ml     Exam: General:A/O 3 (does not know why). Continues to be highly confused. Continues to be highly confused. Eyes: negative scleral hemorrhage ENT: Negative Runny nose, negative gingival bleeding, Neck:  Negative scars, masses, torticollis, lymphadenopathy, JVD Lungs: Clear to auscultation bilaterally without wheezes or crackles Cardiovascular: Regular rate and rhythm without murmur gallop or rub normal S1 and S2 Abdomen:negative abdominal pain, nondistended, positive soft, bowel sounds, no rebound, no ascites, no appreciable mass Extremities: No significant cyanosis, clubbing, or edema bilateral lower extremities Psychiatric:  Unable to fully assess secondary to altered mental status  Neurologic:  Moves all extremities spontaneously, Unable to fully assess secondary to altered mental status .   Data Reviewed: Basic Metabolic Panel:  Recent Labs Lab 12/01/15 0231 12/01/15 1200 12/02/15 0957 12/03/15 1050 12/04/15 0840  12/04/15 2328 12/05/15 0204 12/05/15 0506 12/05/15 1637 12/06/15 0530  NA 113* 115* 117* 118* 117*  < > 124* 125* 126* 128* 126*  K 5.5* 5.1 4.3 4.8 3.9  < > 3.6 3.8 3.8 3.4* 3.0*  CL 74* 75* 79* 79* 75*  < > 76* 77* 79* 76* 78*  CO2 30 32 32 35* 34*  < > 41* 41* 40* 42* 44*  GLUCOSE 92 93 182* 136* 118*  < > 103* 107* 88 123* 108*  BUN 15 13 11 10 7   < > 7 8 8 6  5*  CREATININE 0.63 0.63 0.58 0.61 0.43*  < > 0.48 0.56 0.47 0.49 0.66  CALCIUM 9.5 9.3 9.1 8.8* 9.0  < > 9.2 8.9 9.0 9.4 9.0  MG 2.0 1.8 1.7 2.2 1.7  --   --   --  1.7  --   --   PHOS 3.3  --   --   --   --   --   --   --   --   --   --   < > = values in this interval not displayed. Liver Function Tests:  Recent Labs Lab 11/30/15 1815 12/01/15 1200 12/02/15 0957  AST 46* 73*  68*  ALT 15 15 16   ALKPHOS 87 84 86  BILITOT 1.3* 1.0 0.8  PROT 7.3 6.6 6.8  ALBUMIN 3.4* 3.0* 3.0*   No results for input(s): LIPASE, AMYLASE in the last 168 hours.  Recent Labs Lab 11/30/15 1420  AMMONIA 59*   CBC:  Recent Labs Lab 11/30/15 1420  12/01/15 0231 12/01/15 1200 12/02/15 0957 12/03/15 0840 12/06/15 0530  WBC 10.3  --  11.1* 10.4 9.4 10.1 11.8*  NEUTROABS 7.7  --   --   --  6.9 6.8  --   HGB 11.2*  < > 11.5* 11.4* 11.8* 10.7* 9.5*  HCT 33.6*  < > 34.7* 33.8* 36.3 33.4* 30.2*  MCV 82.4  --  82.2 82.2 84.0 84.3 84.4  PLT 236  --  267 251 267 263 259  < > = values in this interval not displayed. Cardiac Enzymes:  Recent Labs Lab 11/30/15 1427  TROPONINI <0.03   BNP (last 3 results)  Recent Labs  10/29/15 1319  BNP 53.8  ProBNP (last 3 results) No results for input(s): PROBNP in the last 8760 hours.  CBG: No results for input(s): GLUCAP in the last 168 hours.  Recent Results (from the past 240 hour(s))  Culture, Urine     Status: None   Collection Time: 12/02/15  9:04 AM  Result Value Ref Range Status   Specimen Description URINE, CATHETERIZED  Final   Special Requests NONE  Final   Culture NO GROWTH 1 DAY  Final   Report Status 12/03/2015 FINAL  Final  Culture, Urine     Status: None   Collection Time: 12/03/15  8:58 AM  Result Value Ref Range Status   Specimen Description URINE, RANDOM  Final   Special Requests NONE  Final   Culture >=100,000 COLONIES/mL KLEBSIELLA PNEUMONIAE  Final   Report Status 12/05/2015 FINAL  Final   Organism ID, Bacteria KLEBSIELLA PNEUMONIAE  Final      Susceptibility   Klebsiella pneumoniae - MIC*    AMPICILLIN 16 RESISTANT Resistant     CEFAZOLIN <=4 SENSITIVE Sensitive     CEFTRIAXONE <=1 SENSITIVE Sensitive     CIPROFLOXACIN <=0.25 SENSITIVE Sensitive     GENTAMICIN <=1 SENSITIVE Sensitive     IMIPENEM <=0.25 SENSITIVE Sensitive     NITROFURANTOIN 32 SENSITIVE Sensitive     TRIMETH/SULFA <=20  SENSITIVE Sensitive     AMPICILLIN/SULBACTAM <=2 SENSITIVE Sensitive     PIP/TAZO <=4 SENSITIVE Sensitive     * >=100,000 COLONIES/mL KLEBSIELLA PNEUMONIAE     Studies:  Recent x-ray studies have been reviewed in detail by the Attending Physician  Scheduled Meds:  Scheduled Meds: . amLODipine  10 mg Oral Daily  . aspirin EC  81 mg Oral Daily  . cefTRIAXone (ROCEPHIN)  IV  1 g Intravenous Q24H  . famotidine  20 mg Oral Daily  . heparin  5,000 Units Subcutaneous 3 times per day  . levothyroxine  50 mcg Oral QAC breakfast  . lisinopril  5 mg Oral Daily  . LORazepam  0.5 mg Intravenous q12n4p  . olopatadine  1 drop Both Eyes Daily  . pantoprazole  40 mg Oral Daily  . propranolol ER  60 mg Oral Daily  . sodium chloride  1,000 mL Intravenous Once    Time spent on care of this patient: 40 mins   WOODS, Roselind Messier , MD  [facility] Office  [phone] Pager - [phone]  On-Call/Text Page:      [email or web address]      password [email or web address]  If 7PM-7AM, please contact night-coverage [email or web address] Password [email or web address] 12/06/2015, 7:41 AM   LOS: 6 days   Care during the described time interval was provided by me .  I have reviewed this patient's available data, including medical history, events of note, physical examination, and all test results as part of my evaluation. I have personally reviewed and interpreted all radiology studies.   Carolyne Littles, MD [phone] Pager

## 2015-12-07 DIAGNOSIS — E876 Hypokalemia: Secondary | ICD-10-CM

## 2015-12-07 LAB — BASIC METABOLIC PANEL
Anion gap: 6 (ref 5–15)
BUN: <5 mg/dL — ABNORMAL LOW (ref 6–20)
CO2: 44 mmol/L — ABNORMAL HIGH (ref 22–32)
Calcium: 8.7 mg/dL — ABNORMAL LOW (ref 8.9–10.3)
Chloride: 81 mmol/L — ABNORMAL LOW (ref 101–111)
Creatinine, Ser: 0.52 mg/dL (ref 0.44–1.00)
GFR calc Af Amer: >60 mL/min (ref >60)
GFR calc non Af Amer: >60 mL/min (ref >60)
Glucose, Bld: 112 mg/dL — ABNORMAL HIGH (ref 65–99)
Potassium: 3.4 mmol/L — ABNORMAL LOW (ref 3.5–5.1)
Sodium: 131 mmol/L — ABNORMAL LOW (ref 135–145)

## 2015-12-07 LAB — SODIUM
SODIUM: 129 mmol/L — AB (ref 135–145)
Sodium: 131 mmol/L — ABNORMAL LOW (ref 135–145)
Sodium: 131 mmol/L — ABNORMAL LOW (ref 135–145)

## 2015-12-07 MED ORDER — LEVOTHYROXINE SODIUM 75 MCG PO TABS
37.5000 ug | ORAL_TABLET | Freq: Every day | ORAL | Status: DC
  Administered 2015-12-08 – 2015-12-15 (×6): 37.5 ug via ORAL
  Filled 2015-12-07 (×7): qty 1

## 2015-12-07 MED ORDER — ONDANSETRON HCL 4 MG/2ML IJ SOLN
4.0000 mg | Freq: Four times a day (QID) | INTRAMUSCULAR | Status: DC | PRN
  Administered 2015-12-07 – 2015-12-09 (×3): 4 mg via INTRAVENOUS
  Filled 2015-12-07 (×3): qty 2

## 2015-12-07 NOTE — Progress Notes (Signed)
[place] TEAM 1 - Stepdown/ICU TEAM PROGRESS NOTE  Tammy [name] WGN:[id number] DOB: 1924-11-22 DOA: 11/30/2015 PCP: [name],[name], MD  Admit HPI / Brief Narrative: 82 yo F Hx Anxiety, Abnormal Heart Rhythms, HLD, HTN, Chronic Respiratory Failure on home2 L O2, and CVA 2015 [facility] due to being altered and less interactive.  She had been having vomiting and diarrhea intermittently since late November (home health RN apparently tested her for C.diff which was negative). She began to have tremor like movements as well and felt dizzy whenever she would ambulate.  Her confusion worsened and this prompted her son to bring her to ED.  In the ED she was found to be hyponatremic with Na of 111. CT of the head was negative for acute process.  HPI/Subjective: The pt continues to talk nonsensically, but in a less pressured fashion, and less loudly.  When I address her she stops talking, and then answers my questions. She is much more alert today than she was at the time of my last exam ~48hrs ago.  She is in no apparent acute distress.  She c/o diffuse achy pain.       Assessment/Plan:  Hyponatremia  -likely due to volume depletion from her vomiting and diarrhea on and off x 1 month -labs not consistent with SIADH -Na during hospital stay in November 2016 was 126 at admit, and 134 at time of d/c  -Na has slowly improved - she is now out of the danger zone - avoid overaggressive correction, and dose only w/ NS   Hyperkalemia -resolved  AMS  -secondary to hyponatremia +/- ICU delirium/sundowning + UTI  -use sedatives sparingly -MRI brain 12/05/15 w/ no evidence of acute CVA or mass lesion   Klebsiella UTI -cont directed abx tx    Acute on chronic hypoxic respiratory failure  -she was admitted in Nov 2016 for CAP and has required supplemental O2 since  -continue supplemental O2 as needed to maintain SpO2 > 92% - wean Oxygen as able - suspect she can be  liberated to RA soon   HTN -likely somewhat driven by agitation - follow w/o change today   Hypothyroidism? -Low TSH - Free T4 slightly elevated - slightly decrease dose of synthroid - recheck TSH in 8-12 weeks   Hematuria -had hematuria one year ago - presently is likely related to UTI - will need f/u UA once UTI cleared  Deconditioning -PT/OT to begin   Code Status: FULL Family Communication: spoke w/ multiple family members at bedside again today  Disposition Plan: SDU - probable transfer to floor bed 1/5  Consultants: PCCM  Procedures: none  Antibiotics: Rocephin 12/31 >  DVT prophylaxis: SQ heparin   Objective: Blood pressure 148/58, pulse 74, temperature 98.2 F (36.8 C), temperature source Oral, resp. rate 22, height 5\' 2"  (1.575 m), weight 88.7 kg (195 lb 8.8 oz), SpO2 94 %.  Intake/Output Summary (Last 24 hours) at 12/07/15 1601 Last data filed at 12/07/15 1500  Gross per 24 hour  Intake 1893.33 ml  Output   1850 ml  Net  43.33 ml   Exam: General: No acute respiratory distress - more alert  Lungs: Clear to auscultation bilaterally  Cardiovascular: Regular rate and rhythm without murmur gallop or rub  Abdomen: Nontender, nondistended, soft, bowel sounds positive, no rebound, no ascites, no appreciable mass Extremities: No significant cyanosis, clubbing, edema bilateral lower extremities  Data Reviewed:  Basic Metabolic Panel:  Recent Labs Lab 12/01/15 0231  12/01/15 1200 12/02/15 0957 12/03/15 1050 12/04/15 0840  12/05/15 0204 12/05/15 0506 12/05/15 1637 12/06/15 0530  12/06/15 2230 12/07/15 0050 12/07/15 0250 12/07/15 0450 12/07/15 0636  NA 113* 115* 117* 118* 117*  < > 125* 126* 128* 126*  < > 128* 129* 131* 131* 131*  K 5.5* 5.1 4.3 4.8 3.9  < > 3.8 3.8 3.4* 3.0*  --   --   --   --   --  3.4*  CL 74* 75* 79* 79* 75*  < > 77* 79* 76* 78*  --   --   --   --   --  81*  CO2 30 32 32 35* 34*  < > 41* 40* 42* 44*  --   --   --   --   --  44*    GLUCOSE 92 93 182* 136* 118*  < > 107* 88 123* 108*  --   --   --   --   --  112*  BUN 15 13 11 10 7   < > 8 8 6  5*  --   --   --   --   --  <5*  CREATININE 0.63 0.63 0.58 0.61 0.43*  < > 0.56 0.47 0.49 0.66  --   --   --   --   --  0.52  CALCIUM 9.5 9.3 9.1 8.8* 9.0  < > 8.9 9.0 9.4 9.0  --   --   --   --   --  8.7*  MG 2.0 1.8 1.7 2.2 1.7  --   --  1.7  --   --   --   --   --   --   --   --   PHOS 3.3  --   --   --   --   --   --   --   --   --   --   --   --   --   --   --   < > = values in this interval not displayed.  CBC:  Recent Labs Lab 12/01/15 0231 12/01/15 1200 12/02/15 0957 12/03/15 0840 12/06/15 0530  WBC 11.1* 10.4 9.4 10.1 11.8*  NEUTROABS  --   --  6.9 6.8  --   HGB 11.5* 11.4* 11.8* 10.7* 9.5*  HCT 34.7* 33.8* 36.3 33.4* 30.2*  MCV 82.2 82.2 84.0 84.3 84.4  PLT 267 251 267 263 259    Liver Function Tests:  Recent Labs Lab 11/30/15 1815 12/01/15 1200 12/02/15 0957  AST 46* 73* 68*  ALT 15 15 16   ALKPHOS 87 84 86  BILITOT 1.3* 1.0 0.8  PROT 7.3 6.6 6.8  ALBUMIN 3.4* 3.0* 3.0*    Recent Results (from the past 240 hour(s))  Culture, Urine     Status: None   Collection Time: 12/02/15  9:04 AM  Result Value Ref Range Status   Specimen Description URINE, CATHETERIZED  Final   Special Requests NONE  Final   Culture NO GROWTH 1 DAY  Final   Report Status 12/03/2015 FINAL  Final  Culture, Urine     Status: None   Collection Time: 12/03/15  8:58 AM  Result Value Ref Range Status   Specimen Description URINE, RANDOM  Final   Special Requests NONE  Final   Culture >=100,000 COLONIES/mL KLEBSIELLA PNEUMONIAE  Final   Report Status 12/05/2015 FINAL  Final   Organism ID, Bacteria KLEBSIELLA PNEUMONIAE  Final  Susceptibility   Klebsiella pneumoniae - MIC*    AMPICILLIN 16 RESISTANT Resistant     CEFAZOLIN <=4 SENSITIVE Sensitive     CEFTRIAXONE <=1 SENSITIVE Sensitive     CIPROFLOXACIN <=0.25 SENSITIVE Sensitive     GENTAMICIN <=1 SENSITIVE Sensitive      IMIPENEM <=0.25 SENSITIVE Sensitive     NITROFURANTOIN 32 SENSITIVE Sensitive     TRIMETH/SULFA <=20 SENSITIVE Sensitive     AMPICILLIN/SULBACTAM <=2 SENSITIVE Sensitive     PIP/TAZO <=4 SENSITIVE Sensitive     * >=100,000 COLONIES/mL KLEBSIELLA PNEUMONIAE  MRSA PCR Screening     Status: None   Collection Time: 12/06/15 12:24 PM  Result Value Ref Range Status   MRSA by PCR NEGATIVE NEGATIVE Final    Comment:        The GeneXpert MRSA Assay (FDA approved for NASAL specimens only), is one component of a comprehensive MRSA colonization surveillance program. It is not intended to diagnose MRSA infection nor to guide or monitor treatment for MRSA infections.      Studies:   Recent x-ray studies have been reviewed in detail by the Attending Physician  Scheduled Meds:  Scheduled Meds: . amLODipine  10 mg Oral Daily  . aspirin EC  81 mg Oral Daily  . cefTRIAXone (ROCEPHIN)  IV  1 g Intravenous Q24H  . famotidine  20 mg Oral Daily  . heparin  5,000 Units Subcutaneous 3 times per day  . levothyroxine  50 mcg Oral QAC breakfast  . lisinopril  5 mg Oral Daily  . olopatadine  1 drop Both Eyes Daily  . pantoprazole  40 mg Oral Daily  . propranolol ER  60 mg Oral Daily  . sodium chloride  1,000 mL Intravenous Once  . sodium chloride  10-40 mL Intracatheter Q12H    Time spent on care of this patient: 35 mins   MCCLUNG,JEFFREY T , MD   [facility] Office  [phone] Pager - Text Page per Loretha Stapler as per below:  On-Call/Text Page:      [email or web address]      password [email or web address]  If 7PM-7AM, please contact night-coverage [email or web address] Password [email or web address] 12/07/2015, 4:01 PM   LOS: 7 days

## 2015-12-07 NOTE — Progress Notes (Signed)
Occupational Therapy Treatment Patient Details Name: Tammy Fernandez MRN: [id number] DOB: 02-Nov-1924 Today's Date: 12/07/2015    History of present illness Pt is a 82 y/o F admitted due to AMS and less interactive as usual.  CT of head negative for acute process.  Dx: hyponatremia.  Pt's PMH includes CVA in 2015, anxiety.   OT comments  Pt making very slow progress today.  Pt was following a few commands and could be redirected at times. Remains total care.  Talked to daughter about the need to get her consistently assisting with transfers in order to dc home.  Otherwise, feel this pt will need SNF at dc or burden of care on caregivers may be overwhelming.  Follow Up Recommendations  SNF;Supervision/Assistance - 24 hour    Equipment Recommendations  None recommended by OT    Recommendations for Other Services      Precautions / Restrictions Precautions Precautions: Fall Restrictions Weight Bearing Restrictions: No       Mobility Bed Mobility Overal bed mobility: Needs Assistance Bed Mobility: Supine to Sit;Sit to Supine     Supine to sit: Max assist;+2 for physical assistance;+2 for safety/equipment Sit to supine: Max assist;+2 for physical assistance;+2 for safety/equipment   General bed mobility comments: pt too lethargic to initiate movement  Transfers Overall transfer level: Needs assistance               General transfer comment: Pt requesting to lay down after EOB activities. Standing was not achieved.    Balance Overall balance assessment: Needs assistance Sitting-balance support: Bilateral upper extremity supported;Feet supported Sitting balance-[name]: Poor Sitting balance - Comments: Pt could sit at times w/o support but constantly asking to support her head and could not take challenges.       Standing balance comment: pt unable to stand.  Feel physically she could have but did not give effort to assist therapist.                   ADL  Overall ADL's : Needs assistance/impaired Eating/Feeding: Total assistance   Grooming: Total assistance;Bed level                   Toilet Transfer: Total assistance Toilet Transfer Details (indicate cue type and reason): unable to safely perform  Toileting- Clothing Manipulation and Hygiene: Total assistance;Bed level       Functional mobility during ADLs: Total assistance General ADL Comments: Pt attempted to engage in washing face but had very little initation or follow through.      Vision                     Perception     Praxis      Cognition   Behavior During Therapy: Flat affect Overall Cognitive Status: Impaired/Different from baseline Area of Impairment: Orientation;Attention;Memory;Following commands;Safety/judgement;Awareness;Problem solving Orientation Level: Disoriented to;Time;Situation Current Attention Level: Focused Memory: Decreased short-term memory  Following Commands: Follows one step commands inconsistently Safety/Judgement: Decreased awareness of safety;Decreased awareness of deficits Awareness: Intellectual Problem Solving: Slow processing;Decreased initiation;Difficulty sequencing;Requires verbal cues;Requires tactile cues General Comments: Pt continues to talk to herself about things that are not related to topic.  Pt can be redirected for short amounts of time    Extremity/Trunk Assessment               Exercises     Shoulder Instructions       General Comments      Pertinent Vitals/ Pain  Pain Assessment: Faces Faces Pain Scale: Hurts a little bit Pain Location: stomach Pain Descriptors / Indicators: Aching Pain Intervention(s): Limited activity within patient's tolerance;Monitored during session;Repositioned  Home Living                                          Prior Functioning/Environment              Frequency Min 2X/week     Progress Toward Goals  OT Goals(current goals  can now be found in the care plan section)  Progress towards OT goals: Progressing toward goals  Acute Rehab OT Goals Patient Stated Goal: For pt to return to her normal  OT Goal Formulation: With family Time For Goal Achievement: 12/19/15 Potential to Achieve Goals: Good ADL Goals Pt Will Perform Eating: with supervision;sitting Pt Will Perform Grooming: with supervision;sitting Pt Will Perform Upper Body Bathing: with mod assist;sitting Pt Will Transfer to Toilet: with mod assist;stand pivot transfer;bedside commode Pt Will Perform Toileting - Clothing Manipulation and hygiene: with mod assist;sit to/from stand Additional ADL Goal #1: Pt will follow one step commands consistently   Plan Discharge plan remains appropriate    Co-evaluation                 End of Session     Activity Tolerance Patient limited by fatigue   Patient Left in bed;with call bell/phone within reach;with bed alarm set   Nurse Communication Mobility status        Time: 1000-1027 OT Time Calculation (min): 27 min  Charges: OT General Charges $OT Visit: 1 Procedure OT Treatments $Self Care/Home Management : 8-22 mins $Therapeutic Activity: 8-22 mins  Hope BuddsJones, Lillyanne Bradburn Anne 12/07/2015, 10:40 AM  [phone]

## 2015-12-08 ENCOUNTER — Inpatient Hospital Stay (HOSPITAL_COMMUNITY): Payer: Medicare Other

## 2015-12-08 DIAGNOSIS — R410 Disorientation, unspecified: Secondary | ICD-10-CM

## 2015-12-08 DIAGNOSIS — D72829 Elevated white blood cell count, unspecified: Secondary | ICD-10-CM | POA: Diagnosis present

## 2015-12-08 LAB — URIC ACID: URIC ACID, SERUM: 1.5 mg/dL — AB (ref 2.3–6.6)

## 2015-12-08 LAB — BASIC METABOLIC PANEL
ANION GAP: 6 (ref 5–15)
BUN: <5 mg/dL — ABNORMAL LOW (ref 6–20)
CO2: 45 mmol/L — ABNORMAL HIGH (ref 22–32)
CREATININE: 0.6 mg/dL (ref 0.44–1.00)
Calcium: 9.2 mg/dL (ref 8.9–10.3)
Chloride: 76 mmol/L — ABNORMAL LOW (ref 101–111)
GFR calc non Af Amer: >60 mL/min (ref >60)
Glucose, Bld: 120 mg/dL — ABNORMAL HIGH (ref 65–99)
POTASSIUM: 3.4 mmol/L — AB (ref 3.5–5.1)
SODIUM: 127 mmol/L — AB (ref 135–145)

## 2015-12-08 LAB — BLOOD GAS, ARTERIAL
Acid-Base Excess: 16.5 mmol/L — ABNORMAL HIGH (ref 0.0–2.0)
Acid-Base Excess: 17.5 mmol/L — ABNORMAL HIGH (ref 0.0–2.0)
BICARBONATE: 42.2 meq/L — AB (ref 20.0–24.0)
BICARBONATE: 43 meq/L — AB (ref 20.0–24.0)
DRAWN BY: 281201
DRAWN BY: 305991
O2 CONTENT: 2 L/min
O2 Content: 1 L/min
O2 SAT: 89.5 %
O2 SAT: 96 %
PATIENT TEMPERATURE: 98.6
PH ART: 7.403 (ref 7.350–7.450)
PO2 ART: 79.7 mmHg — AB (ref 80.0–100.0)
Patient temperature: 98.6
TCO2: 44.3 mmol/L (ref 0–100)
TCO2: 45 mmol/L (ref 0–100)
pCO2 arterial: 69 mmHg (ref 35.0–45.0)
pCO2 arterial: 64.8 mmHg (ref 35.0–45.0)
pH, Arterial: 7.437 (ref 7.350–7.450)
pO2, Arterial: 59.8 mmHg — ABNORMAL LOW (ref 80.0–100.0)

## 2015-12-08 LAB — CBC
HEMATOCRIT: 29.4 % — AB (ref 36.0–46.0)
Hemoglobin: 9.4 g/dL — ABNORMAL LOW (ref 12.0–15.0)
MCH: 27 pg (ref 26.0–34.0)
MCHC: 32 g/dL (ref 30.0–36.0)
MCV: 84.5 fL (ref 78.0–100.0)
PLATELETS: 259 10*3/uL (ref 150–400)
RBC: 3.48 MIL/uL — AB (ref 3.87–5.11)
RDW: 14.1 % (ref 11.5–15.5)
WBC: 12.9 10*3/uL — ABNORMAL HIGH (ref 4.0–10.5)

## 2015-12-08 MED ORDER — POTASSIUM CHLORIDE 10 MEQ/50ML IV SOLN
10.0000 meq | INTRAVENOUS | Status: AC
  Administered 2015-12-08 (×4): 10 meq via INTRAVENOUS
  Filled 2015-12-08 (×4): qty 50

## 2015-12-08 MED ORDER — POTASSIUM CHLORIDE 10 MEQ/100ML IV SOLN: 10.0000 meq | INTRAVENOUS | Status: DC

## 2015-12-08 MED ORDER — CHLORHEXIDINE GLUCONATE 0.12 % MT SOLN
15.0000 mL | Freq: Two times a day (BID) | OROMUCOSAL | Status: DC
  Administered 2015-12-08 – 2015-12-15 (×11): 15 mL via OROMUCOSAL
  Filled 2015-12-08 (×11): qty 15

## 2015-12-08 MED ORDER — TAMSULOSIN HCL 0.4 MG PO CAPS
0.4000 mg | ORAL_CAPSULE | Freq: Every day | ORAL | Status: DC
  Administered 2015-12-08 – 2015-12-15 (×6): 0.4 mg via ORAL
  Filled 2015-12-08 (×6): qty 1

## 2015-12-08 MED ORDER — CETYLPYRIDINIUM CHLORIDE 0.05 % MT LIQD
7.0000 mL | Freq: Two times a day (BID) | OROMUCOSAL | Status: DC
  Administered 2015-12-08 – 2015-12-15 (×8): 7 mL via OROMUCOSAL

## 2015-12-08 NOTE — Progress Notes (Signed)
[facility] TEAM 1 - Stepdown/ICU TEAM Progress Note  Tammy Fernandez:[id number] DOB: May 27, 1924 DOA: 11/30/2015 PCP: [name], MD  Admit HPI / Brief Narrative: Tammy Fernandez is a 82 y.o. BF PMHx Anxiety, Abnormal Heart Rhythms HLD, HTN, Chronic Respiratory Failure on home  2 L O2, CVA 2015  Brought to [facility] ED 12/28 due to being altered and less interactive compared to usual. Per her son, she has apparently been having vomiting and diarrhea which has intermittently been happening since around thanksgiving (home health RN apparently tested her for C.diff which was negative). Since christmas, she began to have tremor like movements as well and felt as if she was dizzy whenever she would ambulate. Over the past few days, her confusion had worsened and she has been feeling weaker. This prompted son to bring her to ED for further evaluation.  In ED, she was found to be hyponatremic with Na of 111. CT of the head was negative for acute process. [facility] was called for admission but felt pt should be admitted to [facility] service.  HPI/Subjective: 1/5  A/O 3 (does not know why). Continues to be highly confused. Was able to identify several of her family members present in room.    Assessment/Plan: Hyponatremia  - likely due to volume depletion from her vomiting and diarrhea on and off x 1 month. -Review of Epic shows patient has been hyponatremic in the mid -high 120s since November -1/5Reevaluation Labs; consistent with SIADH. High urine osmolality, Low serum osmolality, Low BUN Uric acid pending. -start fluid restriction 830ml/day  Hyperkalemia/Hypokalemia  -Potassium IV 40 mEq.  AMS  -Most likely multifactorial to include UTI, hyponatremia, hospital delirium. -Has not improved significantly with improvement of sodium level. -Brain MRI; nondiagnostic for cause of continued altered mental status -Minimize sedating/mind altering medication. Changed Ativan to PRN. Patient had not  been on scheduled benzodiazepine since admission.  -Continue Haldol PRN -See hyponatremia  Anxiety -Patient on Xanax at home would like to avoid using secondary to her AMS. -See AMS -Would not start SSRI as can cause/exacerbate SIADH  Acute on chronic hypoxic respiratory failure  - she was admitted in Nov 2016 for CAP and has required supplemental O2 since then. -Continue supplemental O2 as needed to maintain SpO2 > 92%. -Albuterol PRN.  HTN -controlled -Continue amlodipine 10 mg daily -Continue lisinopril 5 mg daily -Propranolol 60 mg daily  Hypothyroidism. Continue synthroid 37.5 g daily. -Low TSH. -Free T4; slightly elevated   Chronic Diarrhea (diarrhea greater than 3 weeks)  -12/31 diarrhea resolved chronic diarrhea tests not sent  Chronic Hematuria/UTI positive Klebsiella  -Patient one year ago also had hematuria. Previously evaluated? -Continue Ceftriaxone and complete 5 day course. Stop date 12/09/2015  Leukocytosis -Patient has increased WBC, new infection? Stress Demargination? -Obtain blood culture, urine culture, PCXR  Deconditioning -PT/OT consult; Patient has been in the hospital 2 times within the last 30 days, significantly deconditioned. Some residual weakness from CVA in 2015 evaluate for CIR vs SNF   Code Status: FULL Family Communication: son present at time of exam Disposition Plan: Resolution hyponatremia/diarrhea--> SNF    Consultants:   Procedure/Significant Events: 1/1 [facility] placed left IJ   Culture 12/20 GI pathogen panel not collected 12/30 urine negative final 12/31 urine positive GNR   Antibiotics: Ceftriaxone 12/31>>1/6   DVT prophylaxis: Subcutaneous heparin   Devices    LINES / TUBES:      Continuous Infusions: . sodium chloride 50 mL/hr at 12/08/15 0846  Objective: VITAL SIGNS: Temp: 97.3 F (36.3 C) (01/05 0700) Temp Source: Axillary (01/05 0700) BP: 165/64 mmHg (01/05 1200) Pulse Rate:  72 (01/05 1200) SPO2; FIO2:   Intake/Output Summary (Last 24 hours) at 12/08/15 1228 Last data filed at 12/08/15 1000  Gross per 24 hour  Intake   1540 ml  Output   3050 ml  Net  -1510 ml     Exam: General:A/O 2 (does not know where, why). Continues to be highly confused. Eyes: negative scleral hemorrhage ENT: Negative Runny nose, negative gingival bleeding, Neck:  Negative scars, masses, torticollis, lymphadenopathy, JVD Lungs: Clear to auscultation bilaterally without wheezes or crackles Cardiovascular: Regular rate and rhythm without murmur gallop or rub normal S1 and S2 Abdomen:negative abdominal pain, nondistended, positive soft, bowel sounds, no rebound, no ascites, no appreciable mass Extremities: No significant cyanosis, clubbing, or edema bilateral lower extremities Psychiatric:  Unable to fully assess secondary to altered mental status  Neurologic:  Moves all extremities spontaneously, Unable to fully assess secondary to altered mental status .   Data Reviewed: Basic Metabolic Panel:  Recent Labs Lab 12/02/15 0957 12/03/15 1050 12/04/15 0840  12/05/15 0506 12/05/15 1637 12/06/15 0530  12/07/15 0050 12/07/15 0250 12/07/15 0450 12/07/15 0636 12/08/15 0430  NA 117* 118* 117*  < > 126* 128* 126*  < > 129* 131* 131* 131* 127*  K 4.3 4.8 3.9  < > 3.8 3.4* 3.0*  --   --   --   --  3.4* 3.4*  CL 79* 79* 75*  < > 79* 76* 78*  --   --   --   --  81* 76*  CO2 32 35* 34*  < > 40* 42* 44*  --   --   --   --  44* 45*  GLUCOSE 182* 136* 118*  < > 88 123* 108*  --   --   --   --  112* 120*  BUN 11 10 7   < > 8 6 5*  --   --   --   --  <5* <5*  CREATININE 0.58 0.61 0.43*  < > 0.47 0.49 0.66  --   --   --   --  0.52 0.60  CALCIUM 9.1 8.8* 9.0  < > 9.0 9.4 9.0  --   --   --   --  8.7* 9.2  MG 1.7 2.2 1.7  --  1.7  --   --   --   --   --   --   --   --   < > = values in this interval not displayed. Liver Function Tests:  Recent Labs Lab 12/02/15 0957  AST 68*  ALT 16    ALKPHOS 86  BILITOT 0.8  PROT 6.8  ALBUMIN 3.0*   No results for input(s): LIPASE, AMYLASE in the last 168 hours. No results for input(s): AMMONIA in the last 168 hours. CBC:  Recent Labs Lab 12/02/15 0957 12/03/15 0840 12/06/15 0530 12/08/15 0430  WBC 9.4 10.1 11.8* 12.9*  NEUTROABS 6.9 6.8  --   --   HGB 11.8* 10.7* 9.5* 9.4*  HCT 36.3 33.4* 30.2* 29.4*  MCV 84.0 84.3 84.4 84.5  PLT 267 263 259 259   Cardiac Enzymes: No results for input(s): CKTOTAL, CKMB, CKMBINDEX, TROPONINI in the last 168 hours. BNP (last 3 results)  Recent Labs  10/29/15 1319  BNP 53.8    ProBNP (last 3 results) No results for input(s): PROBNP in the last  8760 hours.  CBG: No results for input(s): GLUCAP in the last 168 hours.  Recent Results (from the past 240 hour(s))  Culture, Urine     Status: None   Collection Time: 12/02/15  9:04 AM  Result Value Ref Range Status   Specimen Description URINE, CATHETERIZED  Final   Special Requests NONE  Final   Culture NO GROWTH 1 DAY  Final   Report Status 12/03/2015 FINAL  Final  Culture, Urine     Status: None   Collection Time: 12/03/15  8:58 AM  Result Value Ref Range Status   Specimen Description URINE, RANDOM  Final   Special Requests NONE  Final   Culture >=100,000 COLONIES/mL KLEBSIELLA PNEUMONIAE  Final   Report Status 12/05/2015 FINAL  Final   Organism ID, Bacteria KLEBSIELLA PNEUMONIAE  Final      Susceptibility   Klebsiella pneumoniae - MIC*    AMPICILLIN 16 RESISTANT Resistant     CEFAZOLIN <=4 SENSITIVE Sensitive     CEFTRIAXONE <=1 SENSITIVE Sensitive     CIPROFLOXACIN <=0.25 SENSITIVE Sensitive     GENTAMICIN <=1 SENSITIVE Sensitive     IMIPENEM <=0.25 SENSITIVE Sensitive     NITROFURANTOIN 32 SENSITIVE Sensitive     TRIMETH/SULFA <=20 SENSITIVE Sensitive     AMPICILLIN/SULBACTAM <=2 SENSITIVE Sensitive     PIP/TAZO <=4 SENSITIVE Sensitive     * >=100,000 COLONIES/mL KLEBSIELLA PNEUMONIAE  MRSA PCR Screening      Status: None   Collection Time: 12/06/15 12:24 PM  Result Value Ref Range Status   MRSA by PCR NEGATIVE NEGATIVE Final    Comment:        The GeneXpert MRSA Assay (FDA approved for NASAL specimens only), is one component of a comprehensive MRSA colonization surveillance program. It is not intended to diagnose MRSA infection nor to guide or monitor treatment for MRSA infections.      Studies:  Recent x-ray studies have been reviewed in detail by the Attending Physician  Scheduled Meds:  Scheduled Meds: . amLODipine  10 mg Oral Daily  . aspirin EC  81 mg Oral Daily  . cefTRIAXone (ROCEPHIN)  IV  1 g Intravenous Q24H  . famotidine  20 mg Oral Daily  . heparin  5,000 Units Subcutaneous 3 times per day  . levothyroxine  37.5 mcg Oral QAC breakfast  . lisinopril  5 mg Oral Daily  . olopatadine  1 drop Both Eyes Daily  . pantoprazole  40 mg Oral Daily  . potassium chloride  10 mEq Intravenous Q1 Hr x 4  . propranolol ER  60 mg Oral Daily  . tamsulosin  0.4 mg Oral Daily    Time spent on care of this patient: 40 mins   Allan Minotti, Roselind Messier , MD  [facility] Office  [phone] Pager [phone]  On-Call/Text Page:      [email or web address]      password [email or web address]  If 7PM-7AM, please contact night-coverage [email or web address] Password [email or web address] 12/08/2015, 12:28 PM   LOS: 8 days   Care during the described time interval was provided by me .  I have reviewed this patient's available data, including medical history, events of note, physical examination, and all test results as part of my evaluation. I have personally reviewed and interpreted all radiology studies.   Carolyne Littles, MD [phone] Pager

## 2015-12-08 NOTE — [place] FL2 (Signed)
[place] MEDICAID FL2 LEVEL OF CARE SCREENING TOOL     IDENTIFICATION  Patient Name: Tammy Fernandez Birthdate: [birth year] Sex: female Admission Date (Current Location): 11/30/2015  [place] and [place] Number:  Haynes Bast [id number] Facility and Address:  [facility], [address], [place], [place] [address]      Provider Number: [id number]  Attending Physician Name and Address:  Drema Dallas, MD  Relative Name and Phone Number:  Halford Decamp - Daughter.  Phone - [phone]    Current Level of Care: Hospital Recommended Level of Care: Skilled Nursing Facility Prior Approval Number:    Date Approved/Denied:   PASRR Number: [id number] (Eff. 01/13/14)  Discharge Plan: SNF    Current Diagnoses: Patient Active Problem List   Diagnosis Date Noted  . Leukocytosis   . Hypokalemia   . Encounter for central line placement   . Anxiety state   . Acute on chronic respiratory failure with hypoxia (HCC)   . Essential hypertension   . Physical deconditioning   . Altered mental status   . Other specified hypothyroidism   . Chronic diarrhea   . Hyponatremia   . CAP (community acquired pneumonia) 10/29/2015  . History of CVA (cerebrovascular accident) 01/19/2014  . Dysphagia, post-stroke 01/15/2014  . Bacteremia due to Staphylococcus 01/13/2014  . Acute respiratory failure with hypoxia (HCC) 01/13/2014  . Acute encephalopathy 01/12/2014  . Left hemiplegia (HCC) 01/12/2014  . Hypertension 01/11/2014  . Acute hyponatremia 01/11/2014  . Hyperkalemia 01/11/2014  . Hematuria 01/11/2014  . E. coli UTI (urinary tract infection) 01/11/2014  . GERD (gastroesophageal reflux disease) 11/14/2011  . Obesities, morbid (HCC) 11/14/2011    Orientation RESPIRATION BLADDER Height & Weight    Self  Normal, O2 (Patient was on 2 Liters oxygen and is now on BiPAP 40 Fi02) Continent   190 lbs.  BEHAVIORAL SYMPTOMS/MOOD NEUROLOGICAL BOWEL NUTRITION STATUS   Incontinent (Incontinent of bowel on 12/08/15) Diet (DYS 1)  AMBULATORY STATUS COMMUNICATION OF NEEDS Skin   Total Care (Unable to ambulate with physical therapist) Verbally Normal                       Personal Care Assistance Level of Assistance  Bathing, Feeding, Dressing Bathing Assistance: Maximum assistance Feeding assistance: Maximum assistance Dressing Assistance: Maximum assistance Total Care Assistance: Maximum assistance (For all ADL's)   Functional Limitations Info  Sight, Hearing, Speech Sight Info: Adequate Hearing Info: Adequate Speech Info: Adequate    SPECIAL CARE FACTORS FREQUENCY  PT (By licensed PT), OT (By licensed OT)     PT Frequency: Evaluated 12/03/15 - Minimum of 2X per week recommended OT Frequency: Evaluated 12/05/15 - Minimum of 2X per week recommended            Contractures Contractures Info: Not present    Additional Factors Info  Code Status, Allergies Code Status Info: Full Code Allergies Info: Apple flavor, peanut butter, Cephalexin, Crestor, Sulfa Drugs.           Current Medications (12/08/2015):  This is the current hospital active medication list Current Facility-Administered Medications  Medication Dose Route Frequency Provider Last Rate Last Dose  . 0.9 %  sodium chloride infusion   Intravenous Continuous Drema Dallas, MD 50 mL/hr at 12/08/15 [phone]    . albuterol (PROVENTIL) (2.5 MG/3ML) 0.083% nebulizer solution 2.5 mg  2.5 mg Nebulization Q2H PRN Lonia Blood, MD      . amLODipine (NORVASC) tablet 10 mg  10 mg Oral Daily Drema Dallasurtis J Woods, MD   10 mg at 12/08/15 [id number]  . aspirin EC tablet 81 mg  81 mg Oral Daily Rahul P Desai, PA-C   81 mg at 12/08/15 0852  . cefTRIAXone (ROCEPHIN) 1 g in dextrose 5 % 50 mL IVPB  1 g Intravenous Q24H Drema Dallasurtis J Woods, MD   1 g at 12/08/15 0847  . famotidine (PEPCID) tablet 20 mg  20 mg Oral Daily Rahul P Desai, PA-C   20 mg at 12/08/15 1010  . haloperidol lactate (HALDOL) injection 2 mg  2 mg  Intravenous Q6H PRN Lonia BloodJeffrey T McClung, MD   2 mg at 12/07/15 2126  . heparin injection 5,000 Units  5,000 Units Subcutaneous 3 times per day Rahul Astrid DraftsP Desai, PA-C   5,000 Units at 12/08/15 0438  . levothyroxine (SYNTHROID, LEVOTHROID) tablet 37.5 mcg  37.5 mcg Oral QAC breakfast Lonia BloodJeffrey T McClung, MD   37.5 mcg at 12/08/15 0851  . lisinopril (PRINIVIL,ZESTRIL) tablet 5 mg  5 mg Oral Daily Rahul P Desai, PA-C   5 mg at 12/08/15 0854  . LORazepam (ATIVAN) injection 0.5 mg  0.5 mg Intravenous BID PRN Drema Dallasurtis J Woods, MD   0.5 mg at 12/06/15 2050  . olopatadine (PATANOL) 0.1 % ophthalmic solution 1 drop  1 drop Both Eyes Daily Rolan Lipahomas Michael Callahan, NP   1 drop at 12/08/15 1011  . ondansetron (ZOFRAN) injection 4 mg  4 mg Intravenous Q6H PRN Lonia BloodJeffrey T McClung, MD   4 mg at 12/08/15 0849  . pantoprazole (PROTONIX) EC tablet 40 mg  40 mg Oral Daily Rahul P Desai, PA-C   40 mg at 12/08/15 1010  . potassium chloride 10 mEq in 50 mL *CENTRAL LINE* IVPB  10 mEq Intravenous Q1 Hr x 4 Drema Dallasurtis J Woods, MD      . propranolol ER (INDERAL LA) 24 hr capsule 60 mg  60 mg Oral Daily Rahul P Desai, PA-C   60 mg at 12/08/15 1010  . sodium chloride 0.9 % injection 10-40 mL  10-40 mL Intracatheter PRN Drema Dallasurtis J Woods, MD      . tamsulosin [facility](FLOMAX) capsule 0.4 mg  0.4 mg Oral Daily Drema Dallasurtis J Woods, MD   0.4 mg at 12/08/15 1014     Discharge Medications: Please see discharge summary for a list of discharge medications.  Relevant Imaging Results:  Relevant Lab Results:   Additional Information    Okey DupreCrawford, Lazaro ArmsVanessa Bradley, LCSW

## 2015-12-08 NOTE — Progress Notes (Signed)
Utilization review complete. Farzad Tibbetts RN CCM Case Mgmt phone [phone] 

## 2015-12-09 DIAGNOSIS — F411 Generalized anxiety disorder: Secondary | ICD-10-CM

## 2015-12-09 LAB — BASIC METABOLIC PANEL
Anion gap: 7 (ref 5–15)
BUN: 6 mg/dL (ref 6–20)
CHLORIDE: 76 mmol/L — AB (ref 101–111)
CO2: 44 mmol/L — AB (ref 22–32)
CREATININE: 0.57 mg/dL (ref 0.44–1.00)
Calcium: 9.6 mg/dL (ref 8.9–10.3)
GFR calc non Af Amer: >60 mL/min (ref >60)
GLUCOSE: 125 mg/dL — AB (ref 65–99)
Potassium: 3.8 mmol/L (ref 3.5–5.1)
Sodium: 127 mmol/L — ABNORMAL LOW (ref 135–145)

## 2015-12-09 LAB — FOLATE: Folate: 13.4 ng/mL (ref >5.9)

## 2015-12-09 LAB — VITAMIN B12: VITAMIN B 12: 539 pg/mL (ref 180–914)

## 2015-12-09 MED ORDER — LISINOPRIL 10 MG PO TABS
10.0000 mg | ORAL_TABLET | Freq: Every day | ORAL | Status: DC
  Administered 2015-12-12 – 2015-12-15 (×3): 10 mg via ORAL
  Filled 2015-12-09 (×4): qty 1

## 2015-12-09 MED ORDER — PROPRANOLOL HCL ER 80 MG PO CP24
80.0000 mg | ORAL_CAPSULE | Freq: Every day | ORAL | Status: DC
  Administered 2015-12-12 – 2015-12-15 (×3): 80 mg via ORAL
  Filled 2015-12-09 (×6): qty 1

## 2015-12-09 MED ORDER — QUETIAPINE FUMARATE 25 MG PO TABS
12.5000 mg | ORAL_TABLET | Freq: Every day | ORAL | Status: DC
  Administered 2015-12-09 – 2015-12-12 (×2): 12.5 mg via ORAL
  Filled 2015-12-09 (×3): qty 1

## 2015-12-09 NOTE — Progress Notes (Signed)
Patient foley placed 1/2. Per Dr. Sharon SellerMcClung, ok to d/c foley catheter and begin a voiding trial for patient.

## 2015-12-09 NOTE — Progress Notes (Signed)
[facility] TEAM 1 - Stepdown/ICU TEAM PROGRESS NOTE  KAARIN PARDY ZOX:[id number] DOB: [birth year] DOA: 11/30/2015 PCP: [name], MD  Admit HPI / Brief Narrative: 82 yo F Hx Anxiety, Abnormal Heart Rhythms, HLD, HTN, Chronic Respiratory Failure on home2 L O2, and CVA 2015 who was brought to [facility] ED 12/28 due to being altered and less interactive.  She had been having vomiting and diarrhea intermittently since late November (home health RN apparently tested her for C.diff which was negative). She began to have tremor like movements as well and felt dizzy whenever she would ambulate.  Her confusion worsened and this prompted her son to bring her to ED.  In the ED she was found to be hyponatremic with Na of 111. CT of the head was negative for acute process.  HPI/Subjective: At the time of my visit today the pt is alert and follows simple commands, but she will not speak.  She does answer one question in a very low whisper.  RN reports that she was conversant this morning, but was hallucinating and believed the staff and her family were trying to harm her.  She does not appear to be in any acute distress, or pain.  She appears much more alert than during my last visit.        Assessment/Plan:  Hyponatremia  -likely due to volume depletion from her vomiting and diarrhea on and off x 1 month -labs not consistent with SIADH (urine Na <10) -Na during hospital stay in November 2016 was 126 at admit, and 134 at time of d/c  -Na has slowly improved - she is now out of the danger zone - avoid overaggressive correction, and dose only w/ NS when needed - I suspect she has a "reset osmostat" and likely normally keeps a Na level in the 127-135 range - follow trend   Hyperkalemia -resolved  AMS  -secondary to hyponatremia +/- ICU delirium/sundowning + UTI  -use sedatives sparingly -MRI brain 12/05/15 w/ no evidence of acute CVA or mass lesion  -?psychiatric component - son tells me she  has "had a nervous breakdown before" - she is on chronic benzo at home, but began very sedate when low dose ativan was given here -transfer out of SDU - trial of only nightly seroquel to assist w/ rest - check ammonia and B12/folte   Klebsiella UTI -cont directed abx tx for a 7 day course   Acute on chronic hypoxic respiratory failure  -she was admitted in Nov 2016 for CAP and "has required supplemental O2 since"  -continue supplemental O2 as needed to maintain SpO2 > 92% - presently her saturations are near 100% on only 2 L - will attempt to liberate her to room air today  HTN -likely somewhat driven by agitation - adjust medical tx and follow    Hypothyroidism w/ over replacement  -Low TSH - Free T4 slightly elevated - slightly decreased dose of synthroid - recheck TSH in 8-12 weeks   Hematuria -had hematuria one year ago - presently is likely related to UTI - will need f/u UA once UTI cleared  Deconditioning -PT/OT to begin   Code Status: FULL Family Communication: spoke w/ son at bedside at length   Disposition Plan: transfer to med bed - PT/OT - wean O2 - follow mental status - follow Na   Consultants: PCCM  Procedures: none  Antibiotics: Rocephin 12/31 >  DVT prophylaxis: SQ heparin   Objective: Blood pressure 149/64, pulse 82, temperature 97.8 F (  36.6 C), temperature source Oral, resp. rate 24, height 5\' 2"  (1.575 m), weight 86.3 kg (190 lb 4.1 oz), SpO2 100 %.  Intake/Output Summary (Last 24 hours) at 12/09/15 1720 Last data filed at 12/09/15 1211  Gross per 24 hour  Intake 755.67 ml  Output   1750 ml  Net -994.33 ml   Exam: General: No acute respiratory distress - alert but not speaking   Lungs: Clear to auscultation bilaterally - no wheeze  Cardiovascular: Regular rate and rhythm without murmur   Abdomen: Nontender, nondistended, soft, bowel sounds positive, no rebound, no ascites, no appreciable mass Extremities: No significant cyanosis, clubbing, or  edema bilateral lower extremities  Data Reviewed:  Basic Metabolic Panel:  Recent Labs Lab 12/03/15 1050 12/04/15 0840  12/05/15 0506 12/05/15 1637 12/06/15 0530  12/07/15 0250 12/07/15 0450 12/07/15 0636 12/08/15 0430 12/09/15 0845  NA 118* 117*  < > 126* 128* 126*  < > 131* 131* 131* 127* 127*  K 4.8 3.9  < > 3.8 3.4* 3.0*  --   --   --  3.4* 3.4* 3.8  CL 79* 75*  < > 79* 76* 78*  --   --   --  81* 76* 76*  CO2 35* 34*  < > 40* 42* 44*  --   --   --  44* 45* 44*  GLUCOSE 136* 118*  < > 88 123* 108*  --   --   --  112* 120* 125*  BUN 10 7  < > 8 6 5*  --   --   --  <5* <5* 6  CREATININE 0.61 0.43*  < > 0.47 0.49 0.66  --   --   --  0.52 0.60 0.57  CALCIUM 8.8* 9.0  < > 9.0 9.4 9.0  --   --   --  8.7* 9.2 9.6  MG 2.2 1.7  --  1.7  --   --   --   --   --   --   --   --   < > = values in this interval not displayed.  CBC:  Recent Labs Lab 12/03/15 0840 12/06/15 0530 12/08/15 0430  WBC 10.1 11.8* 12.9*  NEUTROABS 6.8  --   --   HGB 10.7* 9.5* 9.4*  HCT 33.4* 30.2* 29.4*  MCV 84.3 84.4 84.5  PLT 263 259 259   Liver Function Tests: No results for input(s): AST, ALT, ALKPHOS, BILITOT, PROT, ALBUMIN in the last 168 hours.  Recent Results (from the past 240 hour(s))  Culture, Urine     Status: None   Collection Time: 12/02/15  9:04 AM  Result Value Ref Range Status   Specimen Description URINE, CATHETERIZED  Final   Special Requests NONE  Final   Culture NO GROWTH 1 DAY  Final   Report Status 12/03/2015 FINAL  Final  Culture, Urine     Status: None   Collection Time: 12/03/15  8:58 AM  Result Value Ref Range Status   Specimen Description URINE, RANDOM  Final   Special Requests NONE  Final   Culture >=100,000 COLONIES/mL KLEBSIELLA PNEUMONIAE  Final   Report Status 12/05/2015 FINAL  Final   Organism ID, Bacteria KLEBSIELLA PNEUMONIAE  Final      Susceptibility   Klebsiella pneumoniae - MIC*    AMPICILLIN 16 RESISTANT Resistant     CEFAZOLIN <=4 SENSITIVE  Sensitive     CEFTRIAXONE <=1 SENSITIVE Sensitive     CIPROFLOXACIN <=0.25 SENSITIVE Sensitive  GENTAMICIN <=1 SENSITIVE Sensitive     IMIPENEM <=0.25 SENSITIVE Sensitive     NITROFURANTOIN 32 SENSITIVE Sensitive     TRIMETH/SULFA <=20 SENSITIVE Sensitive     AMPICILLIN/SULBACTAM <=2 SENSITIVE Sensitive     PIP/TAZO <=4 SENSITIVE Sensitive     * >=100,000 COLONIES/mL KLEBSIELLA PNEUMONIAE  MRSA PCR Screening     Status: None   Collection Time: 12/06/15 12:24 PM  Result Value Ref Range Status   MRSA by PCR NEGATIVE NEGATIVE Final    Comment:        The GeneXpert MRSA Assay (FDA approved for NASAL specimens only), is one component of a comprehensive MRSA colonization surveillance program. It is not intended to diagnose MRSA infection nor to guide or monitor treatment for MRSA infections.      Studies:   Recent x-ray studies have been reviewed in detail by the Attending Physician  Scheduled Meds:  Scheduled Meds: . amLODipine  10 mg Oral Daily  . antiseptic oral rinse  7 mL Mouth Rinse q12n4p  . aspirin EC  81 mg Oral Daily  . chlorhexidine  15 mL Mouth Rinse BID  . famotidine  20 mg Oral Daily  . heparin  5,000 Units Subcutaneous 3 times per day  . levothyroxine  37.5 mcg Oral QAC breakfast  . lisinopril  5 mg Oral Daily  . olopatadine  1 drop Both Eyes Daily  . pantoprazole  40 mg Oral Daily  . propranolol ER  60 mg Oral Daily  . tamsulosin  0.4 mg Oral Daily    Time spent on care of this patient: 35 mins   MCCLUNG,JEFFREY T , MD   [facility] Office  [phone] Pager - Text Page per Loretha Stapler as per below:  On-Call/Text Page:      [email or web address]      password [email or web address]  If 7PM-7AM, please contact night-coverage [email or web address] Password [email or web address] 12/09/2015, 5:20 PM   LOS: 9 days

## 2015-12-10 DIAGNOSIS — I10 Essential (primary) hypertension: Secondary | ICD-10-CM

## 2015-12-10 DIAGNOSIS — G20 Parkinson's disease: Secondary | ICD-10-CM

## 2015-12-10 DIAGNOSIS — J9621 Acute and chronic respiratory failure with hypoxia: Secondary | ICD-10-CM

## 2015-12-10 DIAGNOSIS — K529 Noninfective gastroenteritis and colitis, unspecified: Secondary | ICD-10-CM

## 2015-12-10 LAB — COMPREHENSIVE METABOLIC PANEL
ALK PHOS: 59 U/L (ref 38–126)
ALT: 18 U/L (ref 14–54)
AST: 34 U/L (ref 15–41)
Albumin: 2.4 g/dL — ABNORMAL LOW (ref 3.5–5.0)
Anion gap: 9 (ref 5–15)
BILIRUBIN TOTAL: 0.4 mg/dL (ref 0.3–1.2)
BUN: 11 mg/dL (ref 6–20)
CALCIUM: 9.4 mg/dL (ref 8.9–10.3)
CO2: 41 mmol/L — ABNORMAL HIGH (ref 22–32)
CREATININE: 0.75 mg/dL (ref 0.44–1.00)
Chloride: 78 mmol/L — ABNORMAL LOW (ref 101–111)
Glucose, Bld: 96 mg/dL (ref 65–99)
Potassium: 3.8 mmol/L (ref 3.5–5.1)
Sodium: 128 mmol/L — ABNORMAL LOW (ref 135–145)
Total Protein: 5.4 g/dL — ABNORMAL LOW (ref 6.5–8.1)

## 2015-12-10 LAB — CBC
HCT: 26.9 % — ABNORMAL LOW (ref 36.0–46.0)
Hemoglobin: 8.6 g/dL — ABNORMAL LOW (ref 12.0–15.0)
MCH: 26.9 pg (ref 26.0–34.0)
MCHC: 32 g/dL (ref 30.0–36.0)
MCV: 84.1 fL (ref 78.0–100.0)
PLATELETS: 300 10*3/uL (ref 150–400)
RBC: 3.2 MIL/uL — ABNORMAL LOW (ref 3.87–5.11)
RDW: 14.3 % (ref 11.5–15.5)
WBC: 9.6 10*3/uL (ref 4.0–10.5)

## 2015-12-10 LAB — TSH: TSH: 0.669 u[IU]/mL (ref 0.350–4.500)

## 2015-12-10 LAB — AMMONIA: AMMONIA: 32 umol/L (ref 9–35)

## 2015-12-10 MED ORDER — SODIUM CHLORIDE 0.9 % IJ SOLN
10.0000 mL | INTRAMUSCULAR | Status: DC | PRN
  Administered 2015-12-10: 40 mL
  Filled 2015-12-10: qty 40

## 2015-12-10 MED ORDER — SODIUM CHLORIDE 0.9 % IV SOLN
INTRAVENOUS | Status: DC
  Administered 2015-12-10: 11:00:00 via INTRAVENOUS
  Administered 2015-12-11: 50 mL/h via INTRAVENOUS

## 2015-12-10 MED ORDER — SODIUM CHLORIDE 1 G PO TABS
1.0000 g | ORAL_TABLET | Freq: Two times a day (BID) | ORAL | Status: DC
  Filled 2015-12-10 (×2): qty 1

## 2015-12-10 NOTE — Consult Note (Signed)
Reason for Consult: Altered mental status Referring Physician: Dr. Thedore MinsSingh  CC: Confusion  HPI: Tammy Fernandez is an 82 y.o. female with a history of hypothyroidism, hyperlipidemia, hypertension, chronic hyponatremia, previous stroke, and abnormal heart rhythms who was [facility] for further evaluation of nausea, vomiting, diarrhea, tremors, lethargy, and confusion. The history was obtained from the patient's son and her chart. The patient is nonverbal at this time and was unable to contribute any information.   Following admission she was found to have a Klebsiella urinary tract infection which was treated with a 5 day course of Rocephin. She was recently tested for C. difficile which was negative. Her sodium initially was 111. It is now 128. Her son reports that normally the patient is able to ambulate with a cane and had not been experiencing confusion until just recently.  Over the past several days in the hospital the patient has been confused and unable to communicate. Last evening she had a brief period where she was lucid and could carry on a conversation. The nausea vomiting and diarrhea has since resolved.  Past Medical History  Diagnosis Date  . Hyperlipidemia   . Hypertension   . Anxiety   . GERD (gastroesophageal reflux disease)   . Abnormal heart rhythms   . Fatty liver   . Internal hemorrhoid     Past Surgical History  Procedure Laterality Date  . Cesarean section    . Abdominal hysterectomy    . Tee without cardioversion N/A 01/19/2014    Procedure: TRANSESOPHAGEAL ECHOCARDIOGRAM (TEE);  Surgeon: Laurey Moralealton S McLean, MD;  [facility];  Service: Cardiovascular;  Laterality: N/A;  [name]/ja    Family History  Problem Relation Age of Onset  . Stroke Father   . Hypertension Father   . Colon cancer Cousin   . Colon cancer      Aunt  . Diabetes      aunt and cousins  . Heart disease      cousins    Social History:  reports  that she has never smoked. She has never used smokeless tobacco. She reports that she does not drink alcohol or use illicit drugs.  Allergies  Allergen Reactions  . Apple Flavor Hives    No apple sauce  . Peanut Butter Flavor Hives  . Cephalexin Other (See Comments)    dizziness  . Crestor [Rosuvastatin Calcium]   . Sulfa Drugs Cross Reactors Rash    Medications:  Scheduled: . amLODipine  10 mg Oral Daily  . antiseptic oral rinse  7 mL Mouth Rinse q12n4p  . aspirin EC  81 mg Oral Daily  . chlorhexidine  15 mL Mouth Rinse BID  . famotidine  20 mg Oral Daily  . heparin  5,000 Units Subcutaneous 3 times per day  . levothyroxine  37.5 mcg Oral QAC breakfast  . lisinopril  10 mg Oral Daily  . olopatadine  1 drop Both Eyes Daily  . pantoprazole  40 mg Oral Daily  . propranolol ER  80 mg Oral Daily  . QUEtiapine  12.5 mg Oral QHS  . sodium chloride  1 g Oral BID WC  . tamsulosin  0.4 mg Oral Daily    ROS: Unobtainable secondary to the patient's confusion. Her son was unaware of any other recent medical issues except for as noted above and a recent community-acquired pneumonia treated with Zithromax.  Physical Examination: Blood pressure 169/61, pulse 55, temperature 98.5 F (36.9  C), temperature source Oral, resp. rate 16, height 5\' 2"  (1.575 m), weight 85.9 kg (189 lb 6 oz), SpO2 98 %.  General - 82 year old obese female in bed, eyes closed, non-communicative. Heart - Regular rate and rhythm - no murmer Lungs - Clear to auscultation but poor inspiratory effort. Abdomen - Soft - non tender Extremities - Distal pulses weak to absent - no significant edema Skin - Warm and dry   Neurologic Examination  Mental Status: The patient forcefully keeps her eyes closed. She does not attempt to communicate. She follows occasional simple commands. Cranial Nerves: II: Discs not visualized; visual fields and  pupillary reaction could not be tested. III,IV, VI: ptosis not present.  Extraocular movements could not be tested. Tremor / fluttering of both eyelids. V,VII: Face appears symmetric. VIII: hearing could not be tested IX,X: gag reflex could not be tested XI: bilateral shoulder shrug could not be tested XII: midline tongue extension not be tested Motor: The patient would not follow commands for motor testing; however, she was able to hold both arms up against gravity. Increased tone - no atrophy noted. Fine tremors of all extremities noted. Sensory: Could not be tested but minimal or no response to painful stimuli. Deep Tendon Reflexes: 2+ and symmetric throughout Plantars: Right: mute   Left:mute Cerebellar: Unable to test Gait: Not attempted.   Laboratory Studies:   Basic Metabolic Panel:  Recent Labs Lab 12/04/15 0840  12/05/15 0506  12/06/15 0530  12/07/15 0450 12/07/15 0636 12/08/15 0430 12/09/15 0845 12/10/15 0440  NA 117*  < > 126*  < > 126*  < > 131* 131* 127* 127* 128*  K 3.9  < > 3.8  < > 3.0*  --   --  3.4* 3.4* 3.8 3.8  CL 75*  < > 79*  < > 78*  --   --  81* 76* 76* 78*  CO2 34*  < > 40*  < > 44*  --   --  44* 45* 44* 41*  GLUCOSE 118*  < > 88  < > 108*  --   --  112* 120* 125* 96  BUN 7  < > 8  < > 5*  --   --  <5* <5* 6 11  CREATININE 0.43*  < > 0.47  < > 0.66  --   --  0.52 0.60 0.57 0.75  CALCIUM 9.0  < > 9.0  < > 9.0  --   --  8.7* 9.2 9.6 9.4  MG 1.7  --  1.7  --   --   --   --   --   --   --   --   < > = values in this interval not displayed.  Liver Function Tests:  Recent Labs Lab 12/10/15 0440  AST 34  ALT 18  ALKPHOS 59  BILITOT 0.4  PROT 5.4*  ALBUMIN 2.4*   No results for input(s): LIPASE, AMYLASE in the last 168 hours.  Recent Labs Lab 12/10/15 0440  AMMONIA 32    CBC:  Recent Labs Lab 12/06/15 0530 12/08/15 0430 12/10/15 0440  WBC 11.8* 12.9* 9.6  HGB 9.5* 9.4* 8.6*  HCT 30.2* 29.4* 26.9*  MCV 84.4 84.5 84.1  PLT 259 259 300    Cardiac Enzymes: No results for input(s): CKTOTAL, CKMB,  CKMBINDEX, TROPONINI in the last 168 hours.  BNP: Invalid input(s): POCBNP  CBG: No results for input(s): GLUCAP in the last 168 hours.  Microbiology: Results for orders placed  or performed during the hospital encounter of 11/30/15  Culture, Urine     Status: None   Collection Time: 12/02/15  9:04 AM  Result Value Ref Range Status   Specimen Description URINE, CATHETERIZED  Final   Special Requests NONE  Final   Culture NO GROWTH 1 DAY  Final   Report Status 12/03/2015 FINAL  Final  Culture, Urine     Status: None   Collection Time: 12/03/15  8:58 AM  Result Value Ref Range Status   Specimen Description URINE, RANDOM  Final   Special Requests NONE  Final   Culture >=100,000 COLONIES/mL KLEBSIELLA PNEUMONIAE  Final   Report Status 12/05/2015 FINAL  Final   Organism ID, Bacteria KLEBSIELLA PNEUMONIAE  Final      Susceptibility   Klebsiella pneumoniae - MIC*    AMPICILLIN 16 RESISTANT Resistant     CEFAZOLIN <=4 SENSITIVE Sensitive     CEFTRIAXONE <=1 SENSITIVE Sensitive     CIPROFLOXACIN <=0.25 SENSITIVE Sensitive     GENTAMICIN <=1 SENSITIVE Sensitive     IMIPENEM <=0.25 SENSITIVE Sensitive     NITROFURANTOIN 32 SENSITIVE Sensitive     TRIMETH/SULFA <=20 SENSITIVE Sensitive     AMPICILLIN/SULBACTAM <=2 SENSITIVE Sensitive     PIP/TAZO <=4 SENSITIVE Sensitive     * >=100,000 COLONIES/mL KLEBSIELLA PNEUMONIAE  MRSA PCR Screening     Status: None   Collection Time: 12/06/15 12:24 PM  Result Value Ref Range Status   MRSA by PCR NEGATIVE NEGATIVE Final    Comment:        The GeneXpert MRSA Assay (FDA approved for NASAL specimens only), is one component of a comprehensive MRSA colonization surveillance program. It is not intended to diagnose MRSA infection nor to guide or monitor treatment for MRSA infections.     Coagulation Studies: No results for input(s): LABPROT, INR in the last 72 hours.  Urinalysis: No results for input(s): COLORURINE, LABSPEC, PHURINE,  GLUCOSEU, HGBUR, BILIRUBINUR, KETONESUR, PROTEINUR, UROBILINOGEN, NITRITE, LEUKOCYTESUR in the last 168 hours.  Invalid input(s): APPERANCEUR  Lipid Panel:     Component Value Date/Time   CHOL 165 01/12/2014 0606   TRIG 79 01/12/2014 0606   HDL 38* 01/12/2014 0606   CHOLHDL 4.3 01/12/2014 0606   VLDL 16 01/12/2014 0606   LDLCALC 111* 01/12/2014 0606    HgbA1C:  Lab Results  Component Value Date   HGBA1C 7.2* 10/31/2015    Urine Drug Screen:     Component Value Date/Time   LABOPIA NONE DETECTED 01/11/2014 1951   COCAINSCRNUR NONE DETECTED 01/11/2014 1951   LABBENZ POSITIVE* 01/11/2014 1951   AMPHETMU NONE DETECTED 01/11/2014 1951   THCU NONE DETECTED 01/11/2014 1951   LABBARB NONE DETECTED 01/11/2014 1951    Alcohol Level: No results for input(s): ETH in the last 168 hours.  Other results: EKG: Sinus rhythm rate 81 bpm. Please see the formal cardiology reading for complete details.  Imaging:    MRI Brain without contrast 12/05/2015 No acute infarct detected.    Assessment/Plan:   Discussed with Dr. Lavon Paganini probably early Parkinson's disease; although, seizure activity cannot entirely be ruled out especially in light of hyponatremia. An EEG is currently pending.  Further impression and plan to follow per Dr. Josefine Class Rashawn Rolon PA-C [facility] Pager [phone] 12/10/2015, 5:54 PM

## 2015-12-10 NOTE — Progress Notes (Signed)
Bladder scan revealed 677 ml;patient unable to void since cath. Removal on prior shift; orders received to place foley.

## 2015-12-10 NOTE — Progress Notes (Signed)
[facility] PROGRESS NOTE  Tammy [name] WJX:[id number] DOB: 1924/10/06 DOA: 11/30/2015 PCP: [name],[name], MD  Admit HPI / Brief Narrative:  82 yo F Hx Anxiety, Abnormal Heart Rhythms, HLD, HTN, Chronic Respiratory Failure on home2 L O2, and CVA 2015 who was [facility] 12/28 due to being altered and less interactive.  She had been having vomiting and diarrhea intermittently since late November (home health RN apparently tested her for C.diff which was negative). She began to have tremor like movements as well and felt dizzy whenever she would ambulate.  Her confusion worsened and this prompted her son to bring her to ED.  In the ED she was found to be hyponatremic with Na of 111. CT of the head was negative for acute process.  HPI/Subjective:  Elderly morbidly obese African-American female lying in bed in no distress but appears lethargic, able to follow basic commands, will not answer questions, denies any headache, denies any chest or abdominal pain.         Assessment/Plan:  Hyponatremia  Clinically appears to be due to dehydration, urine sodium less than 10, continue gentle normal saline and monitor. She has had issues with hyponatremia in the past. Also she has a "reset osmostat" from chronically being bedbound and less active and likely normally keeps a Na level in the 127-135 range - follow trend.   AMS  -Metabolic encephalopathy due to hyponatremia. MRI brain done on 12/05/2015 nonacute, stable ammonia and B12/folte, pending TSH and RPR. We will also check EEG to rule out seizures. We'll get neuro input as well.   Klebsiella UTI -He has completed 7 days of Rocephin   Acute on chronic hypoxic respiratory failure  -she was admitted in Nov 2016 for CAP and "has required supplemental O2 since"  -continue supplemental O2 as needed to maintain SpO2 > 92% - presently her saturations are near 100% on only 2 L - oxygen now as needed HTN -likely somewhat driven by  agitation - adjust medical tx and follow     Hypothyroidism w/ over replacement  -Low TSH - Free T4 slightly elevated - slightly decreased dose of synthroid - recheck TSH in 8-12 weeks    Hematuria -had hematuria one year ago - presently is likely related to UTI - will need f/u UA in 7-10 days once UTI cleared   Deconditioning -PT/OT to begin     Code Status: FULL Family Communication: spoke w/ son at bedside at length   Disposition Plan: transfer to med bed - PT/OT - wean O2 - follow mental status - follow Na   Consultants: PCCM, Neuro  Procedures: none  Antibiotics: Rocephin 12/31 >  DVT prophylaxis: SQ heparin   Objective: Blood pressure 156/61, pulse 88, temperature 98 F (36.7 C), temperature source Oral, resp. rate 16, height 5\' 2"  (1.575 m), weight 85.9 kg (189 lb 6 oz), SpO2 98 %.  Intake/Output Summary (Last 24 hours) at 12/10/15 1336 Last data filed at 12/10/15 0214  Gross per 24 hour  Intake      0 ml  Output    325 ml  Net   -325 ml   Exam: General: No acute respiratory distress - alert but not speaking   Lungs: Clear to auscultation bilaterally - no wheeze  Cardiovascular: Regular rate and rhythm without murmur   Abdomen: Nontender, nondistended, soft, bowel sounds positive, no rebound, no ascites, no appreciable mass Extremities: No significant cyanosis, clubbing, or edema bilateral lower extremities  Data Reviewed:  Basic Metabolic  Panel:  Recent Labs Lab 12/04/15 0840  12/05/15 0506  12/06/15 0530  12/07/15 0450 12/07/15 0636 12/08/15 0430 12/09/15 0845 12/10/15 0440  NA 117*  < > 126*  < > 126*  < > 131* 131* 127* 127* 128*  K 3.9  < > 3.8  < > 3.0*  --   --  3.4* 3.4* 3.8 3.8  CL 75*  < > 79*  < > 78*  --   --  81* 76* 76* 78*  CO2 34*  < > 40*  < > 44*  --   --  44* 45* 44* 41*  GLUCOSE 118*  < > 88  < > 108*  --   --  112* 120* 125* 96  BUN 7  < > 8  < > 5*  --   --  <5* <5* 6 11  CREATININE 0.43*  < > 0.47  < > 0.66  --   --   0.52 0.60 0.57 0.75  CALCIUM 9.0  < > 9.0  < > 9.0  --   --  8.7* 9.2 9.6 9.4  MG 1.7  --  1.7  --   --   --   --   --   --   --   --   < > = values in this interval not displayed.  CBC:  Recent Labs Lab 12/06/15 0530 12/08/15 0430 12/10/15 0440  WBC 11.8* 12.9* 9.6  HGB 9.5* 9.4* 8.6*  HCT 30.2* 29.4* 26.9*  MCV 84.4 84.5 84.1  PLT 259 259 300   Liver Function Tests:  Recent Labs Lab 12/10/15 0440  AST 34  ALT 18  ALKPHOS 59  BILITOT 0.4  PROT 5.4*  ALBUMIN 2.4*    Recent Results (from the past 240 hour(s))  Culture, Urine     Status: None   Collection Time: 12/02/15  9:04 AM  Result Value Ref Range Status   Specimen Description URINE, CATHETERIZED  Final   Special Requests NONE  Final   Culture NO GROWTH 1 DAY  Final   Report Status 12/03/2015 FINAL  Final  Culture, Urine     Status: None   Collection Time: 12/03/15  8:58 AM  Result Value Ref Range Status   Specimen Description URINE, RANDOM  Final   Special Requests NONE  Final   Culture >=100,000 COLONIES/mL KLEBSIELLA PNEUMONIAE  Final   Report Status 12/05/2015 FINAL  Final   Organism ID, Bacteria KLEBSIELLA PNEUMONIAE  Final      Susceptibility   Klebsiella pneumoniae - MIC*    AMPICILLIN 16 RESISTANT Resistant     CEFAZOLIN <=4 SENSITIVE Sensitive     CEFTRIAXONE <=1 SENSITIVE Sensitive     CIPROFLOXACIN <=0.25 SENSITIVE Sensitive     GENTAMICIN <=1 SENSITIVE Sensitive     IMIPENEM <=0.25 SENSITIVE Sensitive     NITROFURANTOIN 32 SENSITIVE Sensitive     TRIMETH/SULFA <=20 SENSITIVE Sensitive     AMPICILLIN/SULBACTAM <=2 SENSITIVE Sensitive     PIP/TAZO <=4 SENSITIVE Sensitive     * >=100,000 COLONIES/mL KLEBSIELLA PNEUMONIAE  MRSA PCR Screening     Status: None   Collection Time: 12/06/15 12:24 PM  Result Value Ref Range Status   MRSA by PCR NEGATIVE NEGATIVE Final    Comment:        The GeneXpert MRSA Assay (FDA approved for NASAL specimens only), is one component of a comprehensive MRSA  colonization surveillance program. It is not intended to diagnose MRSA infection nor to  guide or monitor treatment for MRSA infections.      Studies:   Recent x-ray studies have been reviewed in detail by the Attending Physician  Scheduled Meds:  Scheduled Meds: . amLODipine  10 mg Oral Daily  . antiseptic oral rinse  7 mL Mouth Rinse q12n4p  . aspirin EC  81 mg Oral Daily  . chlorhexidine  15 mL Mouth Rinse BID  . famotidine  20 mg Oral Daily  . heparin  5,000 Units Subcutaneous 3 times per day  . levothyroxine  37.5 mcg Oral QAC breakfast  . lisinopril  10 mg Oral Daily  . olopatadine  1 drop Both Eyes Daily  . pantoprazole  40 mg Oral Daily  . propranolol ER  80 mg Oral Daily  . QUEtiapine  12.5 mg Oral QHS  . sodium chloride  1 g Oral BID WC  . tamsulosin  0.4 mg Oral Daily    Time spent on care of this patient: 35 mins  Signature  Susa Raring K M.D on 12/10/2015 at 1:36 PM  Between 7am to 7pm - Pager - [phone], After 7pm go to [email or web address] - password [facility]  [facility]  - Office  [phone]   LOS: 10 days

## 2015-12-11 ENCOUNTER — Inpatient Hospital Stay (HOSPITAL_COMMUNITY): Payer: Medicare Other

## 2015-12-11 DIAGNOSIS — G9340 Encephalopathy, unspecified: Secondary | ICD-10-CM

## 2015-12-11 DIAGNOSIS — D72829 Elevated white blood cell count, unspecified: Secondary | ICD-10-CM

## 2015-12-11 DIAGNOSIS — Z452 Encounter for adjustment and management of vascular access device: Secondary | ICD-10-CM

## 2015-12-11 DIAGNOSIS — R5381 Other malaise: Secondary | ICD-10-CM

## 2015-12-11 LAB — CBC
HCT: 30.5 % — ABNORMAL LOW (ref 36.0–46.0)
Hemoglobin: 9.7 g/dL — ABNORMAL LOW (ref 12.0–15.0)
MCH: 27.2 pg (ref 26.0–34.0)
MCHC: 31.8 g/dL (ref 30.0–36.0)
MCV: 85.7 fL (ref 78.0–100.0)
Platelets: 332 K/uL (ref 150–400)
RBC: 3.56 MIL/uL — ABNORMAL LOW (ref 3.87–5.11)
RDW: 14.8 % (ref 11.5–15.5)
WBC: 11.3 K/uL — ABNORMAL HIGH (ref 4.0–10.5)

## 2015-12-11 LAB — BASIC METABOLIC PANEL
ANION GAP: 15 (ref 5–15)
BUN: 10 mg/dL (ref 6–20)
CALCIUM: 9.5 mg/dL (ref 8.9–10.3)
CO2: 35 mmol/L — AB (ref 22–32)
Chloride: 80 mmol/L — ABNORMAL LOW (ref 101–111)
Creatinine, Ser: 0.82 mg/dL (ref 0.44–1.00)
GFR calc Af Amer: >60 mL/min (ref >60)
GFR calc non Af Amer: >60 mL/min (ref >60)
GLUCOSE: 77 mg/dL (ref 65–99)
Potassium: 3.4 mmol/L — ABNORMAL LOW (ref 3.5–5.1)
Sodium: 130 mmol/L — ABNORMAL LOW (ref 135–145)

## 2015-12-11 LAB — URINE MICROSCOPIC-ADD ON

## 2015-12-11 LAB — URINALYSIS, ROUTINE W REFLEX MICROSCOPIC
BILIRUBIN URINE: NEGATIVE
GLUCOSE, UA: NEGATIVE mg/dL
Leukocytes, UA: NEGATIVE
NITRITE: NEGATIVE
PH: 6.5 (ref 5.0–8.0)
Protein, ur: NEGATIVE mg/dL
SPECIFIC GRAVITY, URINE: 1.012 (ref 1.005–1.030)

## 2015-12-11 LAB — RPR: RPR Ser Ql: NONREACTIVE

## 2015-12-11 MED ORDER — SODIUM CHLORIDE 0.9 % IV SOLN
INTRAVENOUS | Status: AC
  Administered 2015-12-12: 04:00:00 via INTRAVENOUS

## 2015-12-11 MED ORDER — POTASSIUM CHLORIDE 10 MEQ/100ML IV SOLN
10.0000 meq | INTRAVENOUS | Status: DC
  Administered 2015-12-11 (×2): 10 meq via INTRAVENOUS
  Filled 2015-12-11: qty 100

## 2015-12-11 MED ORDER — POTASSIUM CHLORIDE 10 MEQ/100ML IV SOLN
10.0000 meq | INTRAVENOUS | Status: AC
  Administered 2015-12-11 (×2): 10 meq via INTRAVENOUS
  Filled 2015-12-11 (×2): qty 100

## 2015-12-11 MED ORDER — AMANTADINE HCL 50 MG/5ML PO SYRP
50.0000 mg | ORAL_SOLUTION | Freq: Every day | ORAL | Status: DC
  Administered 2015-12-11 – 2015-12-13 (×3): 50 mg via ORAL
  Filled 2015-12-11 (×3): qty 5

## 2015-12-11 NOTE — Progress Notes (Signed)
Interval history  :                                                                                                                                       Tammy Fernandez is an 82 y.o. female patient  with  AMS, likely metabolic and toxic encephalopathy, due to chr hyponatremia and klebsiella UTI.He appears slightly better today, in less distress, and able to respond with one word responses. Still drowsy, not doing a good po.  EEG pending. MRI brain showed no acute process.    Past medical history: Past Medical History  Diagnosis Date  . Hyperlipidemia   . Hypertension   . Anxiety   . GERD (gastroesophageal reflux disease)   . Abnormal heart rhythms   . Fatty liver   . Internal hemorrhoid     Past Surgical History  Procedure Laterality Date  . Cesarean section    . Abdominal hysterectomy    . Tee without cardioversion N/A 01/19/2014    Procedure: TRANSESOPHAGEAL ECHOCARDIOGRAM (TEE);  Surgeon: Laurey Morale, MD;  Location: [facility] ENDOSCOPY;  Service: Cardiovascular;  Laterality: N/A;  Tammy Fernandez    Family History: Family History  Problem Relation Age of Onset  . Stroke Father   . Hypertension Father   . Colon cancer Cousin   . Colon cancer      Aunt  . Diabetes      aunt and cousins  . Heart disease      cousins    Social History:   reports that she has never smoked. She has never used smokeless tobacco. She reports that she does not drink alcohol or use illicit drugs.  Allergies:  Allergies  Allergen Reactions  . Apple Flavor Hives    No apple sauce  . Peanut Butter Flavor Hives  . Cephalexin Other (See Comments)    dizziness  . Crestor [Rosuvastatin Calcium]   . Sulfa Drugs Cross Reactors Rash    Medications:   Current facility-administered medications:  .  0.9 %  sodium chloride infusion, , Intravenous, Continuous, Leroy Sea, MD, Last Rate: 75 mL/hr at 12/11/15 1200, 75 mL/hr at 12/11/15 1200 .  albuterol (PROVENTIL) (2.5 MG/3ML) 0.083% nebulizer  solution 2.5 mg, 2.5 mg, Nebulization, Q2H PRN, Lonia Blood, MD .  amantadine (SYMMETREL) solution 50 mg, 50 mg, Oral, Daily, Sherelle Castelli Daniel Nones, MD, 50 mg at 12/11/15 1707 .  amLODipine (NORVASC) tablet 10 mg, 10 mg, Oral, Daily, Drema Dallas, MD, 10 mg at 12/09/15 0900 .  antiseptic oral rinse (CPC / CETYLPYRIDINIUM CHLORIDE 0.05%) solution 7 mL, 7 mL, Mouth Rinse, q12n4p, Drema Dallas, MD, 7 mL at 12/09/15 1600 .  aspirin EC tablet 81 mg, 81 mg, Oral, Daily, Rahul P Desai, PA-C, 81 mg at 12/09/15 0900 .  chlorhexidine (PERIDEX) 0.12 % solution 15 mL, 15 mL, Mouth Rinse, BID, Drema Dallas, MD, 15  mL at 12/11/15 2200 .  famotidine (PEPCID) tablet 20 mg, 20 mg, Oral, Daily, Rahul P Desai, PA-C, 20 mg at 12/09/15 0900 .  heparin injection 5,000 Units, 5,000 Units, Subcutaneous, 3 times per day, Rahul P Desai, PA-C, 5,000 Units at 12/11/15 2250 .  levothyroxine (SYNTHROID, LEVOTHROID) tablet 37.5 mcg, 37.5 mcg, Oral, QAC breakfast, Lonia Blood, MD, 37.5 mcg at 12/09/15 0859 .  lisinopril (PRINIVIL,ZESTRIL) tablet 10 mg, 10 mg, Oral, Daily, Lonia Blood, MD, 10 mg at 12/10/15 1000 .  olopatadine (PATANOL) 0.1 % ophthalmic solution 1 drop, 1 drop, Both Eyes, Daily, Rolan Lipa, NP, 1 drop at 12/11/15 1000 .  ondansetron (ZOFRAN) injection 4 mg, 4 mg, Intravenous, Q6H PRN, Lonia Blood, MD, 4 mg at 12/09/15 0906 .  pantoprazole (PROTONIX) EC tablet 40 mg, 40 mg, Oral, Daily, Rahul P Desai, PA-C, 40 mg at 12/09/15 0900 .  potassium chloride 10 mEq in 100 mL IVPB, 10 mEq, Intravenous, Q1 Hr x 2, Leroy Sea, MD, 10 mEq at 12/11/15 2248 .  propranolol ER (INDERAL LA) 24 hr capsule 80 mg, 80 mg, Oral, Daily, Lonia Blood, MD, 80 mg at 12/10/15 1000 .  QUEtiapine (SEROQUEL) tablet 12.5 mg, 12.5 mg, Oral, QHS, Lonia Blood, MD, 12.5 mg at 12/09/15 2211 .  tamsulosin (FLOMAX) capsule 0.4 mg, 0.4 mg, Oral, Daily, Drema Dallas, MD, 0.4 mg at  12/09/15 0900   Neurologic Examination:                                                                                                      Blood pressure 176/58, pulse 106, temperature 100.2 F (37.9 C), temperature source Oral, resp. rate 16, height 5\' 2"  (1.575 m), weight 85.2 kg (187 lb 13.3 oz), SpO2 95 %.  Drowsy, able to open eyes, no gaze deviation or preference, grimace symmetric, moderate rigidity in all limbs, resting tremor noted.  doesn't follow commands consistently, non verbal except for an occasional one word replies.     Lab Results: Basic Metabolic Panel:  Recent Labs Lab 12/05/15 0506  12/07/15 0636 12/08/15 0430 12/09/15 0845 12/10/15 0440 12/11/15 0522  NA 126*  < > 131* 127* 127* 128* 130*  K 3.8  < > 3.4* 3.4* 3.8 3.8 3.4*  CL 79*  < > 81* 76* 76* 78* 80*  CO2 40*  < > 44* 45* 44* 41* 35*  GLUCOSE 88  < > 112* 120* 125* 96 77  BUN 8  < > <5* <5* 6 11 10   CREATININE 0.47  < > 0.52 0.60 0.57 0.75 0.82  CALCIUM 9.0  < > 8.7* 9.2 9.6 9.4 9.5  MG 1.7  --   --   --   --   --   --   < > = values in this interval not displayed.  Liver Function Tests:  Recent Labs Lab 12/10/15 0440  AST 34  ALT 18  ALKPHOS 59  BILITOT 0.4  PROT 5.4*  ALBUMIN 2.4*   No results for input(s): LIPASE, AMYLASE in the last  168 hours.  Recent Labs Lab 12/10/15 0440  AMMONIA 32    CBC:  Recent Labs Lab 12/06/15 0530 12/08/15 0430 12/10/15 0440 12/11/15 0522  WBC 11.8* 12.9* 9.6 11.3*  HGB 9.5* 9.4* 8.6* 9.7*  HCT 30.2* 29.4* 26.9* 30.5*  MCV 84.4 84.5 84.1 85.7  PLT 259 259 300 332    Cardiac Enzymes: No results for input(s): CKTOTAL, CKMB, CKMBINDEX, TROPONINI in the last 168 hours.  Lipid Panel: No results for input(s): CHOL, TRIG, HDL, CHOLHDL, VLDL, LDLCALC in the last 168 hours.  CBG: No results for input(s): GLUCAP in the last 168 hours.  Microbiology: Results for orders placed or performed during the hospital encounter of 11/30/15  Culture,  Urine     Status: None   Collection Time: 12/02/15  9:04 AM  Result Value Ref Range Status   Specimen Description URINE, CATHETERIZED  Final   Special Requests NONE  Final   Culture NO GROWTH 1 DAY  Final   Report Status 12/03/2015 FINAL  Final  Culture, Urine     Status: None   Collection Time: 12/03/15  8:58 AM  Result Value Ref Range Status   Specimen Description URINE, RANDOM  Final   Special Requests NONE  Final   Culture >=100,000 COLONIES/mL KLEBSIELLA PNEUMONIAE  Final   Report Status 12/05/2015 FINAL  Final   Organism ID, Bacteria KLEBSIELLA PNEUMONIAE  Final      Susceptibility   Klebsiella pneumoniae - MIC*    AMPICILLIN 16 RESISTANT Resistant     CEFAZOLIN <=4 SENSITIVE Sensitive     CEFTRIAXONE <=1 SENSITIVE Sensitive     CIPROFLOXACIN <=0.25 SENSITIVE Sensitive     GENTAMICIN <=1 SENSITIVE Sensitive     IMIPENEM <=0.25 SENSITIVE Sensitive     NITROFURANTOIN 32 SENSITIVE Sensitive     TRIMETH/SULFA <=20 SENSITIVE Sensitive     AMPICILLIN/SULBACTAM <=2 SENSITIVE Sensitive     PIP/TAZO <=4 SENSITIVE Sensitive     * >=100,000 COLONIES/mL KLEBSIELLA PNEUMONIAE  MRSA PCR Screening     Status: None   Collection Time: 12/06/15 12:24 PM  Result Value Ref Range Status   MRSA by PCR NEGATIVE NEGATIVE Final    Comment:        The GeneXpert MRSA Assay (FDA approved for NASAL specimens only), is one component of a comprehensive MRSA colonization surveillance program. It is not intended to diagnose MRSA infection nor to guide or monitor treatment for MRSA infections.      Imaging: Dg Chest Port 1 View  12/11/2015  CLINICAL DATA:  82 year old female with shortness of breath EXAM: PORTABLE CHEST 1 VIEW COMPARISON:  Prior chest x-ray 12/08/2015 FINDINGS: Left IJ approach central venous catheter in unchanged position with the tip at the confluence of the innominate vein and vena cava. Stable cardiac and mediastinal contours. Atherosclerotic calcifications again noted in the  transverse aorta. Chronic elevation of the right hemidiaphragm. Bibasilar atelectasis. Mild vascular congestion without edema. No acute osseous abnormality. IMPRESSION: 1. Unchanged position of left IJ approach central venous catheter. The tip of the catheter overlies the confluence of the innominate vein and mid superior vena cava. 2. Persistent and chronic elevation of the right hemidiaphragm with bibasilar atelectasis. Electronically Signed   By: Malachy MoanHeath  McCullough M.D.   On: 12/11/2015 11:57   Dg Chest Port 1 View  12/08/2015  CLINICAL DATA:  Shortness of breath, pneumonia EXAM: PORTABLE CHEST 1 VIEW COMPARISON:  12/04/2015 FINDINGS: Cardiomegaly again noted. Left IJ central line with tip in SVC on the level  of brachiocephalic vein is unchanged in position. No pneumothorax. Again noted chronic elevation of the right hemidiaphragm. Stable chronic atelectasis or infiltrate right base. Mild degenerative changes thoracic spine. Left lung is clear. No pulmonary edema. IMPRESSION: Stable left IJ central line position. No pneumothorax. Chronic elevation of the right hemidiaphragm. Stable chronic atelectasis or infiltrate right base. No pulmonary edema Electronically Signed   By: Natasha Mead M.D.   On: 12/08/2015 11:19   Dg Abd Portable 1v  12/11/2015  CLINICAL DATA:  Nausea.  Fatty liver disease. EXAM: PORTABLE ABDOMEN - 1 VIEW COMPARISON:  01/14/2014 FINDINGS: The bowel gas pattern is normal. No radio-opaque calculi or other significant radiographic abnormality are seen. IMPRESSION: Negative. Electronically Signed   By: Signa Kell M.D.   On: 12/11/2015 12:57     Assessment and plan:   DALAYLA ALDREDGE is an 82 y.o. female patient with toxic metabolic encephalopathy likely due to combination of chronic hyponatremia along with Klebsiella UTI. MRI brain done on 12/05/2015 showed no acute pathology,  EEG ordered, pending .  Discussed the current assessment and neurological prognosis, in detail with patient's  both sons.  Will f/u

## 2015-12-11 NOTE — Progress Notes (Signed)
TRH PROGRESS NOTE  VERDINE GRENFELL ZOX:[id number] DOB: 04/15/1924 DOA: 11/30/2015 PCP: [name], MD  Admit HPI / Brief Narrative:  82 yo F Hx Anxiety, Abnormal Heart Rhythms, HLD, HTN, Chronic Respiratory Failure on home2 L O2, and CVA 2015 who was brought to [facility] ED 12/28 due to being altered and less interactive.  She had been having vomiting and diarrhea intermittently since late November (home health RN apparently tested her for C.diff which was negative). She began to have tremor like movements as well and felt dizzy whenever she would ambulate.  Her confusion worsened and this prompted her son to bring her to ED.    In the ED she was found to be hyponatremic with Na of 111. CT of the head was negative for acute process.   HPI/Subjective:   Elderly morbidly obese African-American female lying in bed in no distress but appears lethargic, able to follow basic commands, will not answer questions, denies any headache, denies any chest or abdominal pain.         Assessment/Plan:  AMS  -Metabolic encephalopathy due to hyponatremia along with Klebsiella UTI. MRI brain done on 12/05/2015 nonacute, stable ammonia and B12/folte, Stable TSH and RPR. We will also check EEG to rule out seizures. Neurology has been consulted on 12/10/2015. She remains high risk for aspiration. Will repeat chest x-ray on 12/11/2015. Monitor closely. Overall prognosis does not appear good due to ongoing encephalopathy now close to 11 days in the hospital. Explained to the son in detail.   Hyponatremia  Clinically appears to be due to dehydration, urine sodium less than 10, continue gentle normal saline and monitor. She has had issues with hyponatremia in the past. Also she has a "reset osmostat" from chronically being bedbound and less active and likely normally keeps a Na level in the 127-135 range - follow trend.   Klebsiella UTI urinary retention -He has completed 7 days of Rocephin, since she  has developed urinary retention Foley was replaced on 12/10/2015. Already on Flomax. Will have outpatient urology follow-up and will be discharged with Foley. She has remote history of hematuria.   Acute on chronic hypoxic respiratory failure  -she was admitted in Nov 2016 for CAP and "has required supplemental O2 since"  -continue supplemental O2 as needed to maintain SpO2 > 92% - presently her saturations are near 100% on only 2 L - oxygen now as needed HTN -likely somewhat driven by agitation - adjust medical tx and follow     Hypothyroidism w/ over replacement  -Low TSH - Free T4 slightly elevated - slightly decreased dose of synthroid - recheck TSH in 8-12 weeks     Deconditioning -PT/OT to begin     Code Status: FULL Family Communication: spoke w/ son at bedside at length my informed him about poor prognosis and that we should focus on comfort care if she does not show rapid improvement.   Disposition Plan: transfer to med bed - PT/OT - wean O2 - follow mental status - follow Na   Consultants: PCCM, Neuro  Procedures: R.IJ C Line in ICU  Antibiotics: Rocephin 12/31 >  DVT prophylaxis: SQ heparin   Objective: Blood pressure 170/60, pulse 104, temperature 99.8 F (37.7 C), temperature source Oral, resp. rate 16, height 5\' 2"  (1.575 m), weight 85.2 kg (187 lb 13.3 oz), SpO2 100 %.  Intake/Output Summary (Last 24 hours) at 12/11/15 1103 Last data filed at 12/11/15 0745  Gross per 24 hour  Intake  0 ml  Output   1300 ml  Net  -1300 ml   Exam: General: No acute respiratory distress - alert but not speaking   Lungs: Clear to auscultation bilaterally - no wheeze  Cardiovascular: Regular rate and rhythm without murmur   Abdomen: Nontender, nondistended, soft, bowel sounds positive, no rebound, no ascites, no appreciable mass Extremities: No significant cyanosis, clubbing, or edema bilateral lower extremities  Data Reviewed:  Basic Metabolic Panel:  Recent  Labs Lab 12/05/15 0506  12/07/15 0636 12/08/15 0430 12/09/15 0845 12/10/15 0440 12/11/15 0522  NA 126*  < > 131* 127* 127* 128* 130*  K 3.8  < > 3.4* 3.4* 3.8 3.8 3.4*  CL 79*  < > 81* 76* 76* 78* 80*  CO2 40*  < > 44* 45* 44* 41* 35*  GLUCOSE 88  < > 112* 120* 125* 96 77  BUN 8  < > <5* <5* 6 11 10   CREATININE 0.47  < > 0.52 0.60 0.57 0.75 0.82  CALCIUM 9.0  < > 8.7* 9.2 9.6 9.4 9.5  MG 1.7  --   --   --   --   --   --   < > = values in this interval not displayed.  CBC:  Recent Labs Lab 12/06/15 0530 12/08/15 0430 12/10/15 0440 12/11/15 0522  WBC 11.8* 12.9* 9.6 11.3*  HGB 9.5* 9.4* 8.6* 9.7*  HCT 30.2* 29.4* 26.9* 30.5*  MCV 84.4 84.5 84.1 85.7  PLT 259 259 300 332   Liver Function Tests:  Recent Labs Lab 12/10/15 0440  AST 34  ALT 18  ALKPHOS 59  BILITOT 0.4  PROT 5.4*  ALBUMIN 2.4*    Recent Results (from the past 240 hour(s))  Culture, Urine     Status: None   Collection Time: 12/02/15  9:04 AM  Result Value Ref Range Status   Specimen Description URINE, CATHETERIZED  Final   Special Requests NONE  Final   Culture NO GROWTH 1 DAY  Final   Report Status 12/03/2015 FINAL  Final  Culture, Urine     Status: None   Collection Time: 12/03/15  8:58 AM  Result Value Ref Range Status   Specimen Description URINE, RANDOM  Final   Special Requests NONE  Final   Culture >=100,000 COLONIES/mL KLEBSIELLA PNEUMONIAE  Final   Report Status 12/05/2015 FINAL  Final   Organism ID, Bacteria KLEBSIELLA PNEUMONIAE  Final      Susceptibility   Klebsiella pneumoniae - MIC*    AMPICILLIN 16 RESISTANT Resistant     CEFAZOLIN <=4 SENSITIVE Sensitive     CEFTRIAXONE <=1 SENSITIVE Sensitive     CIPROFLOXACIN <=0.25 SENSITIVE Sensitive     GENTAMICIN <=1 SENSITIVE Sensitive     IMIPENEM <=0.25 SENSITIVE Sensitive     NITROFURANTOIN 32 SENSITIVE Sensitive     TRIMETH/SULFA <=20 SENSITIVE Sensitive     AMPICILLIN/SULBACTAM <=2 SENSITIVE Sensitive     PIP/TAZO <=4  SENSITIVE Sensitive     * >=100,000 COLONIES/mL KLEBSIELLA PNEUMONIAE  MRSA PCR Screening     Status: None   Collection Time: 12/06/15 12:24 PM  Result Value Ref Range Status   MRSA by PCR NEGATIVE NEGATIVE Final    Comment:        The GeneXpert MRSA Assay (FDA approved for NASAL specimens only), is one component of a comprehensive MRSA colonization surveillance program. It is not intended to diagnose MRSA infection nor to guide or monitor treatment for MRSA infections.  Studies:   Recent x-ray studies have been reviewed in detail by the Attending Physician  Scheduled Meds:  Scheduled Meds: . amantadine  50 mg Oral Daily  . amLODipine  10 mg Oral Daily  . antiseptic oral rinse  7 mL Mouth Rinse q12n4p  . aspirin EC  81 mg Oral Daily  . chlorhexidine  15 mL Mouth Rinse BID  . famotidine  20 mg Oral Daily  . heparin  5,000 Units Subcutaneous 3 times per day  . levothyroxine  37.5 mcg Oral QAC breakfast  . lisinopril  10 mg Oral Daily  . olopatadine  1 drop Both Eyes Daily  . pantoprazole  40 mg Oral Daily  . potassium chloride  10 mEq Intravenous Q1 Hr x 4  . propranolol ER  80 mg Oral Daily  . QUEtiapine  12.5 mg Oral QHS  . tamsulosin  0.4 mg Oral Daily    Time spent on care of this patient: 35 mins  Signature  Susa RaringSINGH,Teresita Fanton K M.D on 12/11/2015 at 11:03 AM  Between 7am to 7pm - Pager - [phone], After 7pm go to [email or web address] - password [email or web address]  [facility]  - Office  [phone]   LOS: 11 days

## 2015-12-12 ENCOUNTER — Inpatient Hospital Stay (HOSPITAL_COMMUNITY): Payer: Medicare Other

## 2015-12-12 ENCOUNTER — Inpatient Hospital Stay (HOSPITAL_COMMUNITY)
Admit: 2015-12-12 | Discharge: 2015-12-12 | Disposition: A | Discharge status: Home / Self Care | Payer: Medicare Other | Attending: Neurology | Admitting: Neurology

## 2015-12-12 DIAGNOSIS — R400 Somnolence: Secondary | ICD-10-CM

## 2015-12-12 DIAGNOSIS — R4182 Altered mental status, unspecified: Secondary | ICD-10-CM

## 2015-12-12 LAB — MAGNESIUM: Magnesium: 1.7 mg/dL (ref 1.7–2.4)

## 2015-12-12 LAB — CBC
HEMATOCRIT: 30.1 % — AB (ref 36.0–46.0)
HEMOGLOBIN: 9.5 g/dL — AB (ref 12.0–15.0)
MCH: 27.1 pg (ref 26.0–34.0)
MCHC: 31.6 g/dL (ref 30.0–36.0)
MCV: 86 fL (ref 78.0–100.0)
Platelets: 352 10*3/uL (ref 150–400)
RBC: 3.5 MIL/uL — AB (ref 3.87–5.11)
RDW: 15.4 % (ref 11.5–15.5)
WBC: 14.6 10*3/uL — AB (ref 4.0–10.5)

## 2015-12-12 LAB — BASIC METABOLIC PANEL
ANION GAP: 14 (ref 5–15)
BUN: 10 mg/dL (ref 6–20)
CALCIUM: 9.4 mg/dL (ref 8.9–10.3)
CHLORIDE: 88 mmol/L — AB (ref 101–111)
CO2: 32 mmol/L (ref 22–32)
Creatinine, Ser: 0.88 mg/dL (ref 0.44–1.00)
GFR calc non Af Amer: 57 mL/min — ABNORMAL LOW (ref >60)
GLUCOSE: 88 mg/dL (ref 65–99)
Potassium: 3.4 mmol/L — ABNORMAL LOW (ref 3.5–5.1)
Sodium: 134 mmol/L — ABNORMAL LOW (ref 135–145)

## 2015-12-12 LAB — URINE CULTURE

## 2015-12-12 MED ORDER — ADULT MULTIVITAMIN W/MINERALS CH
1.0000 | ORAL_TABLET | Freq: Every day | ORAL | Status: DC
  Administered 2015-12-12 – 2015-12-13 (×2): 1 via ORAL
  Filled 2015-12-12 (×2): qty 1

## 2015-12-12 MED ORDER — ALPRAZOLAM 0.25 MG PO TABS
0.2500 mg | ORAL_TABLET | Freq: Two times a day (BID) | ORAL | Status: DC
  Administered 2015-12-12 – 2015-12-13 (×3): 0.25 mg via ORAL
  Filled 2015-12-12 (×3): qty 1

## 2015-12-12 MED ORDER — RESOURCE THICKENUP CLEAR PO POWD
ORAL | Status: DC | PRN
  Filled 2015-12-12: qty 125

## 2015-12-12 MED ORDER — PRO-STAT SUGAR FREE PO LIQD
30.0000 mL | Freq: Every day | ORAL | Status: DC
  Administered 2015-12-12 – 2015-12-15 (×4): 30 mL via ORAL
  Filled 2015-12-12 (×4): qty 30

## 2015-12-12 MED ORDER — SODIUM CHLORIDE 0.9 % IV SOLN
INTRAVENOUS | Status: DC
  Administered 2015-12-12: 15:00:00 via INTRAVENOUS

## 2015-12-12 NOTE — Progress Notes (Signed)
Physical Therapy Treatment Patient Details Name: Tammy Fernandez MRN: [id number] DOB: 1924/08/16 Today's Date: 12/12/2015    History of Present Illness Pt is a 82 y/o F admitted due to AMS and less interactive as usual.  CT of head negative for acute process.  Dx: hyponatremia.  Pt's PMH includes CVA in 2015, anxiety.    PT Comments    Anxiety limiting Ms. Balbach's ability to participate in mobility practice today; Max assist and multimodal cueing for upright sitting once EOB, however pt persisted with posterior lean/push, and resisted attempts to help her lean forward and translate her center of mass anteriorly over her base of support; with such a heavy posterior lean, the only safe transfer OOB will be with the [facility] lift at this time   Follow Up Recommendations  SNF;Supervision/Assistance - 24 hour     Equipment Recommendations  Hospital bed;Wheelchair cushion (measurements PT);Wheelchair (measurements PT) Michiel Sites lift)    Recommendations for Other Services       Precautions / Restrictions Precautions Precautions: Fall    Mobility  Bed Mobility Overal bed mobility: Needs Assistance Bed Mobility: Supine to Sit;Sit to Supine     Supine to sit: Max assist;+2 for physical assistance;+2 for safety/equipment Sit to supine: Max assist;+2 for physical assistance;+2 for safety/equipment   General bed mobility comments: Max assist for all forms of bed mobility  Transfers                 General transfer comment: Unable to perform transfer safely as pt maintained a heavy posterior push while sitting EOB  Ambulation/Gait                 Stairs            Wheelchair Mobility    Modified Rankin (Stroke Patients Only)       Balance     Sitting balance-[name]: Zero       Standing balance-[name]: Zero                      Cognition Arousal/Alertness: Awake/alert Behavior During Therapy: Restless;Anxious Overall Cognitive  Status: Impaired/Different from baseline Area of Impairment: Orientation;Attention;Following commands;Safety/judgement;Awareness;Problem solving Orientation Level: Disoriented to;Time;Situation     Following Commands: Follows one step commands inconsistently Safety/Judgement: Decreased awareness of safety;Decreased awareness of deficits   Problem Solving: Slow processing;Decreased initiation;Difficulty sequencing;Requires verbal cues;Requires tactile cues General Comments: perseverating on needing to eat or take medications    Exercises      General Comments        Pertinent Vitals/Pain Pain Assessment: Faces Faces Pain Scale: Hurts a little bit Pain Location: generalized; pt also quite anxious Pain Descriptors / Indicators:  (I'm nervous) Pain Intervention(s): Limited activity within patient's tolerance;Relaxation    Home Living                      Prior Function            PT Goals (current goals can now be found in the care plan section) Acute Rehab PT Goals Patient Stated Goal: None stated PT Goal Formulation: With patient/family Time For Goal Achievement: 12/17/15 Potential to Achieve Goals: Fair Progress towards PT goals: Not progressing toward goals - comment (limited by anxiety; May need to downgrade goals next session)    Frequency  Min 2X/week    PT Plan Current plan remains appropriate    Co-evaluation  End of Session Equipment Utilized During Treatment: Gait belt (bedpad) Activity Tolerance: Other (comment) (treatment limited by anxiety and posterior lean/push) Patient left: in bed;with call bell/phone within reach;with family/visitor present;with bed alarm set (with bed in semi-chair position)     Time: 1430-1445 PT Time Calculation (min) (ACUTE ONLY): 15 min  Charges:  $Therapeutic Activity: 8-22 mins                    G Codes:      Olen PelGarrigan, Burnham Trost Hamff 12/12/2015, 4:26 PM  Van ClinesHolly Jaelan Rasheed, [place]PT  Acute  Rehabilitation Services Pager [phone] Office [phone]

## 2015-12-12 NOTE — Progress Notes (Signed)
TRH PROGRESS NOTE  Tammy [name] ZOX:[id number] DOB: [birth year] DOA: 11/30/2015 PCP: Tammy PatellaEADE,[name], MD  Admit HPI / Brief Narrative:  82 yo F Hx Anxiety, Abnormal Heart Rhythms, HLD, HTN, Chronic Respiratory Failure on home2 L O2, and CVA 2015 who [facility] due to being altered and less interactive.  She had been having vomiting and diarrhea intermittently since late November (home health RN apparently tested her for C.diff which was negative). She began to have tremor like movements as well and felt dizzy whenever she would ambulate.  Her confusion worsened and this prompted her son to bring her to ED.    In the ED she was found to be hyponatremic with Na of 111. CT of the head was negative for acute process.   HPI/Subjective:   Elderly morbidly obese African-American female lying in bed in no distress but appears lethargic, able to follow basic commands, will not answer questions, denies any headache, denies any chest or abdominal pain.         Assessment/Plan:  AMS  -Metabolic encephalopathy due to hyponatremia along with Klebsiella UTI. MRI brain done on 12/05/2015 nonacute, stable ammonia and B12/folte, Stable TSH and RPR. We will also check EEG to rule out seizures. Neurology has been consulted on 12/10/2015. She remains high risk for aspiration. Although chest x-rays are not very impressive I think she is having microaspiration as she's developed a low-grade temperature with leukocytosis, will reconsult speech, Overall prognosis does not appear good due to ongoing encephalopathy now close to 11 days in the hospital. Explained to the son in detail.   Hyponatremia  Clinically appears to be due to dehydration, urine sodium less than 10, improved with hydration.   Klebsiella UTI urinary retention -He has completed 7 days of Rocephin, since she has developed urinary retention Foley was replaced on 12/10/2015. Already on Flomax. Will have outpatient  urology follow-up and will be discharged with Foley. She has remote history of hematuria.   Acute on chronic hypoxic respiratory failure  -she was admitted in Nov 2016 for CAP and "has required supplemental O2 since"  -continue supplemental O2 as needed to maintain SpO2 > 92% - presently her saturations are near 100% on only 2 L - oxygen now as needed HTN -likely somewhat driven by agitation - adjust medical tx and follow     Hypothyroidism w/ over replacement  -Low TSH - Free T4 slightly elevated - slightly decreased dose of synthroid - recheck TSH in 8-12 weeks    Low-grade temperature with leukocytosis developing since 12/11/2015.  Repeat UA appears unremarkable, question if she is having microaspiration, speech reevaluation, chest x-ray remains unimpressive although she has chronic right-sided hemidiaphragm elevation which could be hiding an infiltrate, if she runs a temp of 100.4 we'll place her on antibiotics. We'll discontinue her right IJ central line.   Deconditioning -PT/OT      Code Status: FULL Family Communication: Spoke with multiple family members more than 3 times, explained poor prognosis and to consider comfort measures.   Disposition Plan: transfer to med bed - PT/OT - wean O2 - follow mental status - follow Na   Consultants: PCCM, Neuro  Procedures: R.IJ C Line in ICU, discontinue on 12/12/2015  Antibiotics:  Anti-infectives    Start     Dose/Rate Route Frequency Ordered Stop   12/06/15 0800  cefTRIAXone (ROCEPHIN) 1 g in dextrose 5 % 50 mL IVPB  Status:  Discontinued     1 g 100  mL/hr over 30 Minutes Intravenous Every 24 hours 12/05/15 1758 12/09/15 0830   12/03/15 0800  cefTRIAXone (ROCEPHIN) 2 g in dextrose 5 % 50 mL IVPB  Status:  Discontinued     2 g 100 mL/hr over 30 Minutes Intravenous Every 24 hours 12/03/15 0753 12/05/15 1758       DVT prophylaxis: SQ heparin   Objective: Blood pressure 126/61, pulse 114, temperature 98.8 F (37.1 C),  temperature source Oral, resp. rate 18, height 5\' 2"  (1.575 m), weight 85.4 kg (188 lb 4.4 oz), SpO2 97 %.  Intake/Output Summary (Last 24 hours) at 12/12/15 1355 Last data filed at 12/12/15 0925  Gross per 24 hour  Intake 1606.25 ml  Output   1325 ml  Net 281.25 ml   Exam: General: No acute respiratory distress - alert but not speaking   Lungs: Clear to auscultation bilaterally - no wheeze  Cardiovascular: Regular rate and rhythm without murmur   Abdomen: Nontender, nondistended, soft, bowel sounds positive, no rebound, no ascites, no appreciable mass Extremities: No significant cyanosis, clubbing, or edema bilateral lower extremities  Data Reviewed:  Basic Metabolic Panel:  Recent Labs Lab 12/08/15 0430 12/09/15 0845 12/10/15 0440 12/11/15 0522 12/12/15 0552  NA 127* 127* 128* 130* 134*  K 3.4* 3.8 3.8 3.4* 3.4*  CL 76* 76* 78* 80* 88*  CO2 45* 44* 41* 35* 32  GLUCOSE 120* 125* 96 77 88  BUN <5* 6 11 10 10   CREATININE 0.60 0.57 0.75 0.82 0.88  CALCIUM 9.2 9.6 9.4 9.5 9.4  MG  --   --   --   --  1.7    CBC:  Recent Labs Lab 12/06/15 0530 12/08/15 0430 12/10/15 0440 12/11/15 0522 12/12/15 0552  WBC 11.8* 12.9* 9.6 11.3* 14.6*  HGB 9.5* 9.4* 8.6* 9.7* 9.5*  HCT 30.2* 29.4* 26.9* 30.5* 30.1*  MCV 84.4 84.5 84.1 85.7 86.0  PLT 259 259 300 332 352   Liver Function Tests:  Recent Labs Lab 12/10/15 0440  AST 34  ALT 18  ALKPHOS 59  BILITOT 0.4  PROT 5.4*  ALBUMIN 2.4*    Recent Results (from the past 240 hour(s))  Culture, Urine     Status: None   Collection Time: 12/03/15  8:58 AM  Result Value Ref Range Status   Specimen Description URINE, RANDOM  Final   Special Requests NONE  Final   Culture >=100,000 COLONIES/mL KLEBSIELLA PNEUMONIAE  Final   Report Status 12/05/2015 FINAL  Final   Organism ID, Bacteria KLEBSIELLA PNEUMONIAE  Final      Susceptibility   Klebsiella pneumoniae - MIC*    AMPICILLIN 16 RESISTANT Resistant     CEFAZOLIN <=4  SENSITIVE Sensitive     CEFTRIAXONE <=1 SENSITIVE Sensitive     CIPROFLOXACIN <=0.25 SENSITIVE Sensitive     GENTAMICIN <=1 SENSITIVE Sensitive     IMIPENEM <=0.25 SENSITIVE Sensitive     NITROFURANTOIN 32 SENSITIVE Sensitive     TRIMETH/SULFA <=20 SENSITIVE Sensitive     AMPICILLIN/SULBACTAM <=2 SENSITIVE Sensitive     PIP/TAZO <=4 SENSITIVE Sensitive     * >=100,000 COLONIES/mL KLEBSIELLA PNEUMONIAE  MRSA PCR Screening     Status: None   Collection Time: 12/06/15 12:24 PM  Result Value Ref Range Status   MRSA by PCR NEGATIVE NEGATIVE Final    Comment:        The GeneXpert MRSA Assay (FDA approved for NASAL specimens only), is one component of a comprehensive MRSA colonization surveillance program. It is  not intended to diagnose MRSA infection nor to guide or monitor treatment for MRSA infections.   Urine culture     Status: None   Collection Time: 12/11/15 12:15 PM  Result Value Ref Range Status   Specimen Description URINE, CATHETERIZED  Final   Special Requests NONE  Final   Culture 9,000 COLONIES/mL INSIGNIFICANT GROWTH  Final   Report Status 12/12/2015 FINAL  Final     Studies:   Recent x-ray studies have been reviewed in detail by the Attending Physician  Scheduled Meds:  Scheduled Meds: . amantadine  50 mg Oral Daily  . amLODipine  10 mg Oral Daily  . antiseptic oral rinse  7 mL Mouth Rinse q12n4p  . aspirin EC  81 mg Oral Daily  . chlorhexidine  15 mL Mouth Rinse BID  . famotidine  20 mg Oral Daily  . feeding supplement (PRO-STAT SUGAR FREE 64)  30 mL Oral Daily  . heparin  5,000 Units Subcutaneous 3 times per day  . levothyroxine  37.5 mcg Oral QAC breakfast  . lisinopril  10 mg Oral Daily  . multivitamin with minerals  1 tablet Oral Daily  . olopatadine  1 drop Both Eyes Daily  . pantoprazole  40 mg Oral Daily  . propranolol ER  80 mg Oral Daily  . QUEtiapine  12.5 mg Oral QHS  . tamsulosin  0.4 mg Oral Daily    Time spent on care of this patient:  35 mins  Signature  Susa Raring K M.D on 12/12/2015 at 1:55 PM  Between 7am to 7pm - Pager - [phone], After 7pm go to [email or web address] - password [facility]  [facility]  - Office  [phone]   LOS: 12 days

## 2015-12-12 NOTE — Progress Notes (Signed)
EEG Completed; Results Pending  

## 2015-12-12 NOTE — Procedures (Signed)
ELECTROENCEPHALOGRAM REPORT  Patient: Tammy Fernandez       Room #: 1O105M01 EEG No. ID: [id number] Age: 82 y.o.        Sex: female Referring Physician: [facility], Demetrius CharityP Report Date:  12/12/2015        Interpreting Physician: Aline BrochureSTEWART,Cady Hafen R  History: Tammy Fernandez is an 82 y.o. female admitted with altered mental status with associated hyponatremia and urinary tract infection. MRI of her brain showed no acute intracranial abnormality.  Indications for study:  Assess severity of encephalopathy; rule out seizure activity.  Technique: This is an 18 channel routine scalp EEG performed at the bedside with bipolar and monopolar montages arranged in accordance to the international 10/20 system of electrode placement.   Description: Patient was noted to be confused and somewhat agitated during the study. Predominant background activity consisted of low amplitude mixed delta and theta activity diffusely and symmetrically. Significant superimposed muscle artifact was also recorded. Photic stimulation was not performed. No epileptiform discharges were recorded.  Interpretation: This EEG is abnormal with mild generalized nonspecific continuous slowing of cerebral activity, which can be seen with metabolic and toxic encephalopathies as well as with degenerative CNS disorders. No evidence of seizure disorder was demonstrated.   Venetia MaxonR Moriya Mitchell M.D. [facility] [phone]

## 2015-12-12 NOTE — Progress Notes (Signed)
Initial Nutrition Assessment  DOCUMENTATION CODES:   Obesity unspecified  INTERVENTION:  Provide Magic cup TID with meals, each supplement provides 290 kcal and 9 grams of protein Provide Pro-stat once daily, provides 15 grams of protein and 100 kcal Provide Multivitamin with minerals daily   NUTRITION DIAGNOSIS:   Increased nutrient needs related to wound healing as evidenced by estimated needs.   GOAL:   Patient will meet greater than or equal to 90% of their needs   MONITOR:   PO intake, Supplement acceptance, Weight trends, Labs, Skin  REASON FOR ASSESSMENT:   Low [place], LOS    ASSESSMENT:   82 year old with history of stroke, GERD, recent admission for CAP 11/16. She was discharged on Omnicef and azithromycin. Admitted today with altered mental status, vomiting, diarrhea, poor PO intake since Thanksgiving. C. difficile checked at home and was negative. She was brought to the ED by her son as her mental status and confusion worsened.  Pt out of room at time of visit. RD spoke with pt's son in pt room and pt's daughter over the phone. They report that patient was eating well PTA. Since admission, PO intake has been decreased/variable. Per nursing notes, pt is eating 100% of some meals, 0-30% of some meals. She now has a stage II pressure ulcer on buttocks. Her weight has fluctuated since admission, but overall appears stable. Son states that patient has been nonverbal the past few days and started talking again this morning. He fed patient yogurt and a Magic Cup ice cream this morning.   Labs: low chloride, low hemoglobin  Diet Order:  DIET - DYS 1 Room service appropriate?: Yes; Fluid consistency:: Nectar Thick  Skin:  Wound (see comment) (stage II pressure ulcer on right upper buttocks)  Last BM:  1/7  Height:   Ht Readings from Last 1 Encounters:  12/09/15 5\' 2"  (1.575 m)    Weight:   Wt Readings from Last 1 Encounters:  12/12/15 188 lb 4.4 oz (85.4 kg)     Ideal Body Weight:  50 kg  BMI:  Body mass index is 34.43 kg/(m^2).  Estimated Nutritional Needs:   Kcal:  1500-1700  Protein:  85-100 grams  Fluid:  1.7 L/day  EDUCATION NEEDS:   No education needs identified at this time  Dorothea Ogleeanne Jie Stickels RD, LDN Inpatient Clinical Dietitian Pager: [phone] After Hours Pager: [phone]

## 2015-12-12 NOTE — Evaluation (Signed)
Clinical/Bedside Swallow Evaluation Patient Details  Name: Tammy Fernandez MRN: [id number] Date of Birth: 06/14/1924  Today's Date: 12/12/2015 Time: SLP Start Time (ACUTE ONLY): 1015 SLP Stop Time (ACUTE ONLY): 1034 SLP Time Calculation (min) (ACUTE ONLY): 19 min  Past Medical History:  Past Medical History  Diagnosis Date  . Hyperlipidemia   . Hypertension   . Anxiety   . GERD (gastroesophageal reflux disease)   . Abnormal heart rhythms   . Fatty liver   . Internal hemorrhoid    Past Surgical History:  Past Surgical History  Procedure Laterality Date  . Cesarean section    . Abdominal hysterectomy    . Tee without cardioversion N/A 01/19/2014    Procedure: TRANSESOPHAGEAL ECHOCARDIOGRAM (TEE);  Surgeon: Laurey Moralealton S McLean, MD;  [facility];  Service: Cardiovascular;  Laterality: N/A;  [name]/ja   HPI:  82 yo F Hx Anxiety, Abnormal Heart Rhythms, HLD, HTN, Chronic Respiratory Failure on home2 L O2, and CVA 2015 who [facility] due to being altered and less interactive. She had been having vomiting and diarrhea intermittently since late November. Metabolic encephalopathy due to hyponatremia along with Klebsiella UTI. SLP evaluated pt on 12/29, found to have normal swallow function, recommended to consume regular/thin. Pt has been confused and at times nonverbal/lethargic during admission, diet was downgraded by RN/MD to puree on 12/08/14 and PO intake is reportedly poor.  MD concerned about aspiration risk. Most recent CXR shows cardiac enlargement and moderate vascular congestion without overt pulmonary edema.    Assessment / Plan / Recommendation Clinical Impression  Pts mentation has worsened since initial eval immediately following admission. The pt is arousable, but dysarthric and lethargic. She has decreased awareness of POs, particularly with cup sips and when alternating between solids and liquids, puckering for sips when offered spoon of puree. Oral  manipulation of purees and thin is slow with increased concern for delayed swallow. DUe to change in mentation pt requires careful had feeding. With extra care, no signs of aspiration observed, however pt is at increased risk, particularly if arousal or positioning are further impaired. Recommend pt consume a more conservative diet to reduce risk until mentation is more more consistently appropriate. Will start Dys 1 nectar and follow for tolerance.     Aspiration Risk  Moderate aspiration risk    Diet Recommendation Dysphagia 1 (Puree);Nectar-thick liquid   Liquid Administration via: Cup;Straw Medication Administration: Crushed with puree Supervision: Staff to assist with self feeding;Full supervision/cueing for compensatory strategies Compensations: Slow rate;Small sips/bites Postural Changes: Seated upright at 90 degrees    Other  Recommendations Oral Care Recommendations: Oral care BID Other Recommendations: Order thickener from pharmacy;Have oral suction available   Follow up Recommendations  Skilled Nursing facility    Frequency and Duration min 2x/week  2 weeks       Prognosis Prognosis for Safe Diet Advancement: Fair      Swallow Study   General HPI: 82 yo F Hx Anxiety, Abnormal Heart Rhythms, HLD, HTN, Chronic Respiratory Failure on home2 L O2, and CVA 2015 who [facility] due to being altered and less interactive. She had been having vomiting and diarrhea intermittently since late November. Metabolic encephalopathy due to hyponatremia along with Klebsiella UTI. SLP evaluated pt on 12/29, found to have normal swallow function, recommended to consume regular/thin. Pt has been confused and at times nonverbal/lethargic during admission, diet was downgraded by RN/MD to puree on 12/08/14 and PO intake is reportedly  poor.  MD concerned about aspiration risk. Most recent CXR shows cardiac enlargement and moderate vascular congestion without overt pulmonary edema.  Type  of Study: Bedside Swallow Evaluation Previous Swallow Assessment:  see history Diet Prior to this Study: Dysphagia 1 (puree);Thin liquids Temperature Spikes Noted: Yes Respiratory Status: Nasal cannula History of Recent Intubation: No Behavior/Cognition: Lethargic/Drowsy;Confused;Requires cueing Oral Cavity Assessment: Within Functional Limits Oral Care Completed by SLP: No Oral Cavity - Dentition: Missing dentition Self-Feeding Abilities: Total assist Patient Positioning: Upright in bed Baseline Vocal Quality: Low vocal intensity Volitional Cough: Cognitively unable to elicit Volitional Swallow: Unable to elicit    Oral/Motor/Sensory Function Overall Oral Motor/Sensory Function: Within functional limits   Ice Chips Ice chips: Not tested   Thin Liquid Thin Liquid: Impaired Presentation: Straw;Cup Oral Phase Impairments: Poor awareness of bolus Oral Phase Functional Implications: Prolonged oral transit Pharyngeal  Phase Impairments: Suspected delayed Swallow    Nectar Thick Nectar Thick Liquid: Not tested   Honey Thick Honey Thick Liquid: Not tested   Puree Puree: Impaired Presentation: Spoon Oral Phase Impairments: Poor awareness of bolus Oral Phase Functional Implications: Prolonged oral transit   Solid   GO    Solid: Not tested      Harlon Ditty, MA CCC-SLP [phone]  Claudine Mouton 12/12/2015,11:12 AM

## 2015-12-12 NOTE — Consult Note (Signed)
WOC wound consult note Reason for Consult: skin tear buttock Patient is confused Wound type: Stage 2 Pressure Injury right buttock/gluteal cleft Pressure Ulcer POA: No Measurement: 4cm x 6cm x 0.1cm  Wound bed: pink, moist, partial thickness skin loss  Drainage (amount, consistency, odor) minimal, serous Periwound: intact  Dressing procedure/placement/frequency: Silicone foam, however I will add air mattress as this patient does not appear to turn independently.  Change foam every 3 days and PRN soilage.   Discussed POC with  bedside nurse.  Re consult if needed, will not follow at this time. Thanks  Estanislado Surgeon [employer] RN, CWOCN [phone])

## 2015-12-12 NOTE — Clinical Social Work Note (Signed)
Clinical Social Work Assessment  Patient Details  Name: Tammy Fernandez MRN: [id number] Date of Birth: 12/10/1923  Date of referral:  12/12/15               Reason for consult:  Facility Placement, Discharge Planning                Permission sought to share information with:  Family Supports, Customer service manager, Case Optician, dispensing granted to share information::  Yes, Verbal Permission Granted  Name::      Tammy Fernandez )  Agency::   (SNF's )  Relationship::   (Son)  Sport and exercise psychologist Information:   [phone])  Housing/Transportation Living arrangements for the past 2 months:  Single Family Home Source of Information:  Adult Children Patient Interpreter Needed:  None Criminal Activity/Legal Involvement Pertinent to Current Situation/Hospitalization:  No - Comment as needed Significant Relationships:  Adult Children Lives with:  Adult Children Do you feel safe going back to the place where you live?  Yes Need for family participation in patient care:  No (Coment)  Care giving concerns:  LOS:12. Patient deconditioned and requiring short-term rehab placement.    Social Worker assessment / plan:  Holiday representative met with patient and family at bedside in reference to post-acute placement. CSW introduced CSW role and SNF process. CSW reviewed and provided SNF list with bed offers. Pt's son and family reported that pt's son Tammy Mink is the primary Media planner. CSW has attempted to contact [place] several times throughout the day and awaiting a returned phone call. No further concerns reported at this time. CSW will continue to follow pt and pt's family for continued support and to facilitate pt's discharge needs once medically stable.   Employment status:  Retired Nurse, adult PT Recommendations:  [place] / Referral to community resources:  [place]   Patient/Family's Response to care:  Pt's alert and  disoriented x 3. Pt's family all agreeable to SNF placement however pt's son, Tammy Mink will make all final decisions. Pt's family involved and supportive of pt's care. Pt's family pleasant and appreciated social work intervention.   Patient/Family's Understanding of and Emotional Response to Diagnosis, Current Treatment, and Prognosis:  Pt's family informed of medical interventions and on-going treatment.   Emotional Assessment Appearance:  Appears stated age Attitude/Demeanor/Rapport:  Unable to Assess (Pleasant ) Affect (typically observed):  Pleasant Orientation:  Oriented to Self Alcohol / Substance use:  Not Applicable Psych involvement (Current and /or in the community):  No (Comment)  Discharge Needs  Concerns to be addressed:  Care Coordination Readmission within the last 30 days:  No Current discharge risk:  Dependent with Mobility Barriers to Discharge:  Continued Medical Work up   [employer], MSW, LCSWA [phone] 12/12/2015 4:21 PM

## 2015-12-12 NOTE — Clinical Social Work Placement (Addendum)
   CLINICAL SOCIAL WORK PLACEMENT  NOTE  Date:  12/12/2015  Patient Details  Name: Tammy Fernandez MRN: [id number] Date of Birth: 28-Nov-1924  Clinical Social Work is seeking post-discharge placement for this patient at the Skilled  Nursing Facility level of care (*CSW will initial, date and re-position this form in  chart as items are completed):  Yes   Patient/family provided with [facility] Clinical Social Work Department's list of facilities offering this level of care within the geographic area requested by the patient (or if unable, by the patient's family).  Yes   Patient/family informed of their freedom to choose among providers that offer the needed level of care, that participate in Medicare, Medicaid or managed care program needed by the patient, have an available bed and are willing to accept the patient.  Yes   Patient/family informed of [facility]'s ownership interest [facility] and [facility], as well as of the fact that they are under no obligation to receive care at these facilities.  PASRR submitted to EDS on 12/12/15     PASRR number received on 12/12/15     Existing PASRR number confirmed on       FL2 transmitted to all facilities in geographic area requested by pt/family on 12/12/15     FL2 transmitted to all facilities within larger geographic area on       Patient informed that his/her managed care company has contracts with or will negotiate with certain facilities, including the following:        Yes   Patient/family informed of bed offers received.  Patient chooses bed at   [facility]    Physician recommends and patient chooses bed at      Patient to be transferred to   on  .  Patient to be transferred to facility by  EMS     Patient family notified on   12/15/2015  of transfer.  Name of family member notified:    daughterRhunette Croft- [name]    PHYSICIAN        12/12/2015, 4:21 PM

## 2015-12-12 NOTE — Progress Notes (Signed)
Subjective: Patient awake and bale to name who her husband is but perseverating on eating. Per nurse both SLP and son have tried to get her to eat but she will not. She is in bed asking for food and pills.   Exam: Filed Vitals:   12/12/15 0515 12/12/15 1052  BP: 162/68 126/61  Pulse: 115 114  Temp: 99.5 F (37.5 C) 98.8 F (37.1 C)  Resp: 18 18    HEENT-  Normocephalic, no lesions, without obvious abnormality.  Normal external eye and conjunctiva.  Normal TM's bilaterally.  Normal auditory canals and external ears. Normal external nose, mucus membranes and septum.  Normal pharynx. Cardiovascular- S1, S2 normal, pulses palpable throughout   Lungs- chest clear, no wheezing, rales, normal symmetric air entry Abdomen- normal findings: bowel sounds normal Extremities- no edema Lymph-no adenopathy palpable     Gen: In bed, NAD MS: alert and able to name her husband and son. Does not know date, year or month. Does not attempt to think about questions but perseverating on want ig"something to eat and medicine. " CN: PERRLA, EOMI, TML, Face equal, Speech clear Motor: MAEW 3/5 Sensory: intact and withdraws from pain.    Pertinent Labs: NA up to 134 K 3.4 WBC increased to 14.6 from 11.6 EEG pending  Felicie MornDavid Smith PA-C [facility] [phone]  Impression: 82 yo F with acute encephalopathy due to UTI. She is improving with treatment. If Her EEG does not reveal epileptiform activity, then neurology has no further recommendations.    Recommendations: 1) If EEG is normal, we will sign off.    Ritta SlotMcNeill Kirkpatrick, MD [facility] [phone]  If 7pm- 7am, please page neurology on call as listed in AMION.  12/12/2015, 10:59 AM

## 2015-12-12 NOTE — Clinical Social Work Note (Signed)
Clinical Social Worker has assessed patient and pt's family at bedside. Full psychosocial assessment to follow. SNF list with bed offers given.   CSW remains available as needed.  Derenda FennelBashira Velva Molinari, MSW, LCSWA [phone] 12/12/2015 12:03 PM

## 2015-12-13 ENCOUNTER — Inpatient Hospital Stay (HOSPITAL_COMMUNITY): Payer: Medicare Other

## 2015-12-13 DIAGNOSIS — Z789 Other specified health status: Secondary | ICD-10-CM

## 2015-12-13 LAB — BASIC METABOLIC PANEL
ANION GAP: 6 (ref 5–15)
BUN: 17 mg/dL (ref 6–20)
CO2: 39 mmol/L — ABNORMAL HIGH (ref 22–32)
Calcium: 9 mg/dL (ref 8.9–10.3)
Chloride: 95 mmol/L — ABNORMAL LOW (ref 101–111)
Creatinine, Ser: 0.84 mg/dL (ref 0.44–1.00)
GFR calc Af Amer: >60 mL/min (ref >60)
GFR, EST NON AFRICAN AMERICAN: 60 mL/min — AB (ref >60)
GLUCOSE: 126 mg/dL — AB (ref 65–99)
POTASSIUM: 3 mmol/L — AB (ref 3.5–5.1)
SODIUM: 140 mmol/L (ref 135–145)

## 2015-12-13 LAB — CBC
HCT: 26.7 % — ABNORMAL LOW (ref 36.0–46.0)
Hemoglobin: 8.5 g/dL — ABNORMAL LOW (ref 12.0–15.0)
MCH: 28.1 pg (ref 26.0–34.0)
MCHC: 31.8 g/dL (ref 30.0–36.0)
MCV: 88.1 fL (ref 78.0–100.0)
PLATELETS: 279 10*3/uL (ref 150–400)
RBC: 3.03 MIL/uL — AB (ref 3.87–5.11)
RDW: 15.6 % — ABNORMAL HIGH (ref 11.5–15.5)
WBC: 15.9 10*3/uL — AB (ref 4.0–10.5)

## 2015-12-13 MED ORDER — POTASSIUM CHLORIDE 20 MEQ/15ML (10%) PO SOLN
40.0000 meq | Freq: Once | ORAL | Status: AC
  Administered 2015-12-13: 40 meq via ORAL
  Filled 2015-12-13: qty 30

## 2015-12-13 MED ORDER — PIPERACILLIN-TAZOBACTAM 3.375 G IVPB
3.3750 g | Freq: Three times a day (TID) | INTRAVENOUS | Status: DC
  Administered 2015-12-13 – 2015-12-15 (×7): 3.375 g via INTRAVENOUS
  Filled 2015-12-13 (×10): qty 50

## 2015-12-13 MED ORDER — ENOXAPARIN SODIUM 40 MG/0.4ML ~~LOC~~ SOLN
40.0000 mg | SUBCUTANEOUS | Status: DC
  Administered 2015-12-13 – 2015-12-14 (×2): 40 mg via SUBCUTANEOUS
  Filled 2015-12-13 (×2): qty 0.4

## 2015-12-13 MED ORDER — MIRTAZAPINE 15 MG PO TBDP
7.5000 mg | ORAL_TABLET | Freq: Every day | ORAL | Status: DC
  Administered 2015-12-14 – 2015-12-15 (×2): 7.5 mg via ORAL
  Filled 2015-12-13 (×5): qty 0.5

## 2015-12-13 MED ORDER — BISACODYL 10 MG RE SUPP
10.0000 mg | Freq: Every day | RECTAL | Status: DC
  Administered 2015-12-13 – 2015-12-15 (×3): 10 mg via RECTAL
  Filled 2015-12-13 (×3): qty 1

## 2015-12-13 MED ORDER — WHITE PETROLATUM GEL
Status: AC
  Administered 2015-12-13: 05:00:00
  Filled 2015-12-13: qty 1

## 2015-12-13 MED ORDER — ACETAMINOPHEN 325 MG PO TABS
650.0000 mg | ORAL_TABLET | Freq: Four times a day (QID) | ORAL | Status: DC | PRN
  Administered 2015-12-13 – 2015-12-14 (×2): 650 mg via ORAL
  Filled 2015-12-13 (×3): qty 2

## 2015-12-13 MED ORDER — ALPRAZOLAM 0.5 MG PO TABS
0.5000 mg | ORAL_TABLET | Freq: Two times a day (BID) | ORAL | Status: DC
  Administered 2015-12-13: 0.5 mg via ORAL
  Filled 2015-12-13: qty 1

## 2015-12-13 MED ORDER — ALPRAZOLAM 0.5 MG PO TABS: 0.5000 mg | ORAL_TABLET | Freq: Three times a day (TID) | ORAL | Status: DC

## 2015-12-13 MED ORDER — POTASSIUM CHLORIDE CRYS ER 20 MEQ PO TBCR
40.0000 meq | EXTENDED_RELEASE_TABLET | ORAL | Status: DC
  Administered 2015-12-13: 40 meq via ORAL
  Filled 2015-12-13: qty 2

## 2015-12-13 MED ORDER — ALPRAZOLAM 0.5 MG PO TABS
0.5000 mg | ORAL_TABLET | Freq: Three times a day (TID) | ORAL | Status: DC
  Administered 2015-12-13 – 2015-12-14 (×2): 0.5 mg via ORAL
  Filled 2015-12-13 (×2): qty 1

## 2015-12-13 MED ORDER — SODIUM CHLORIDE 0.9 % IV SOLN
500.0000 mg | Freq: Two times a day (BID) | INTRAVENOUS | Status: DC
  Administered 2015-12-13 – 2015-12-15 (×5): 500 mg via INTRAVENOUS
  Filled 2015-12-13 (×7): qty 500

## 2015-12-13 NOTE — Progress Notes (Signed)
Patient seen and I met with her son and grand-daughter- she is agitated, mental status improved however from admission-she has pressured speech-capacity questionable but she is no longer lethargic. Suspect delirium- multifactorial. Son tells me plan is to go to [facility] rehab- low health literacy- not the best time to discuss advance directives- family focused on her medications and anxiety right now expecting full recovery. I recommend palliative consult for goals of care at SNF after discharge. Minimize medication list and side effects. Avoid re-hospitalization.  Full consult note to follow. Will add on hydrocodone for pain- c/o severe back pain.  Elizabeth Golding, DO Palliative Medicine  

## 2015-12-13 NOTE — Consult Note (Signed)
Consultation Note Date: 12/13/2015   Patient Name: Tammy Fernandez  DOB: 1923-12-20  MRN: [id number]  Age / Sex: 82 y.o., female  PCP: Maury Dus, MD Referring Physician: Thurnell Lose, MD  Reason for Consultation: Establishing goals of care    Clinical Assessment/Narrative: 82 yo woman admitted on 12/28 with AMS and severe hyponatremia. Prolonged hospitalization and persistent mental status changes have complicated admission, now day 13, some improvement today-more alert, talkative but clearly with aspects of delirium -now with hyperactive delirium features and pressured speech. Patient confused, she complains of back pain a chronic problem for her (OA), she tells me she only trusts Dr. Mariea Clonts. She reports we are trying to stick needles in her back, she is frightened. She also tells me that she wants to get completely better. Conversation also dominated by delusions, religiosity and flight of ideas-family states this is not her baseline. Family says that she has been known to "speak in tongues" a religious gift since her childhood.  Met with son, patient and grandaughter. Overall poor understanding of her health problems or severity of illness. Patient wants to "go home"- she requires max assist OOB- 24 SNF for rehab is being recommended. Family expecting a full recovery, son feels her mental status and hyponatremia were cause by her medications-he requests minimal medication.  Currently not open to a discussion about advance directives or code status- heavy reliance on religious based decision making.  Contacts/Participants in Discussion: Primary Decision Maker: [name]   Relationship to Patient son HCPOA: yes    SUMMARY OF RECOMMENDATIONS  Code Status/Advance Care Planning: Full code- by default- not open to a discussion about her "getting worse" or about wishes at EOL.     Code Status Orders          Start     Ordered   11/30/15 2033  Full code   Continuous     11/30/15 2034    Code Status History    Date Active Date Inactive Code Status Order ID Comments User Context   10/29/2015  4:11 PM 11/01/2015  5:38 PM Full Code [id number]  Samella Parr, NP Inpatient   01/19/2014  7:37 PM 01/19/2014  7:37 PM Full Code [id number]  Marybelle Killings, MD Inpatient   01/19/2014  7:37 PM 01/29/2014  2:28 PM Full Code [id number]  Cathlyn Parsons, PA-C Inpatient   01/11/2014 10:37 PM 01/19/2014  7:37 PM Full Code [id number]  Toy Baker, MD Inpatient      Other Directives:None  Symptom Management:   Pain, chronic low back pain -has in the past taken Tylenol. Recommend scheduled doses, she may need low dose opiate such as vicodin or tramadol to help with her pain.  Schedule Tylenol, add on PO hydrocodone PRN and scheduled QHS  Increased home dose of alprazolam to 0.5 TID, not sedated currently    Palliative Prophylaxis:   Aspiration, Bowel Regimen, Frequent Pain Assessment and Oral Care  Additional Recommendations (Limitations, Scope, Preferences):  Avoid Hospitalization, Minimize Medications and Full Scope Treatment  Son expressed concerns over multiple medications and medication side effects leading to her problems-I reassured him I would review her list and discontinue any non-essential medications.  I discontinued amantadine, may be exacerbating hallucinations or psychiatric symptoms, patient has small vessel disease on MRI, strongly suspect components of vascular dementia, hyponatremia appears to have responded to hydration, she may also have endocrine issues, or possibly diuretic induced on HBP meds PTA. Reviewed other meds for possible side effects and worsening of  delirium.   Discontinued Seroquel, may also be causing worsening of her mental status, if antipsychotic needed would recommend low dose Xyprexa qhs. Suspect these drugs may have been causing some of her rigididty  Will start  7.5 of Mirtazapine QHS for depression/anxiety/pain and sleep  Scheduled Tyelenol, PRN hydrocodone or tramadol   Psycho-social/Spiritual:  Support System: Strong Desire for further Chaplaincy support:yes Additional Recommendations: Caregiving  Support/Resources and Referral to [employer]   Prognosis: < 12 months- possible much less, she is an elderly woman with functional status decline and 2nd admission in 6 months- high mortality rate with these two factors alone.  Discharge Planning: [facility] for rehab with Palliative care service follow-up   Chief Complaint/ Primary Diagnoses: Present on Admission:  . Hyponatremia . Altered mental status . Other specified hypothyroidism . Chronic diarrhea . Physical deconditioning . Essential hypertension . Encounter for central line placement . Anxiety state . Acute on chronic respiratory failure with hypoxia ([place]) . Hypokalemia . Leukocytosis  I have reviewed the medical record, interviewed the patient and family, and examined the patient. The following aspects are pertinent.  Past Medical History  Diagnosis Date  . Hyperlipidemia   . Hypertension   . Anxiety   . GERD (gastroesophageal reflux disease)   . Abnormal heart rhythms   . Fatty liver   . Internal hemorrhoid    Social History   Social History  . Marital Status: Widowed    Spouse Name: N/A  . Number of Children: 7  . Years of Education: N/A   Social History Main Topics  . Smoking status: Never Smoker   . Smokeless tobacco: Never Used  . Alcohol Use: No  . Drug Use: No  . Sexual Activity: Not Asked   Other Topics Concern  . None   Social History Narrative   Patient lives at home with her son         Family History  Problem Relation Age of Onset  . Stroke Father   . Hypertension Father   . Colon cancer Cousin   . Colon cancer      Aunt  . Diabetes      aunt and cousins  . Heart disease      cousins   Scheduled Meds: .  ALPRAZolam  0.5 mg Oral TID  . amLODipine  10 mg Oral Daily  . antiseptic oral rinse  7 mL Mouth Rinse q12n4p  . aspirin EC  81 mg Oral Daily  . bisacodyl  10 mg Rectal Daily  . chlorhexidine  15 mL Mouth Rinse BID  . enoxaparin (LOVENOX) injection  40 mg Subcutaneous Q24H  . famotidine  20 mg Oral Daily  . feeding supplement (PRO-STAT SUGAR FREE 64)  30 mL Oral Daily  . levothyroxine  37.5 mcg Oral QAC breakfast  . lisinopril  10 mg Oral Daily  . mirtazapine  7.5 mg Oral QHS  . olopatadine  1 drop Both Eyes Daily  . pantoprazole  40 mg Oral Daily  . piperacillin-tazobactam (ZOSYN)  IV  3.375 g Intravenous 3 times per day  . propranolol ER  80 mg Oral Daily  . tamsulosin  0.4 mg Oral Daily  . vancomycin  500 mg Intravenous Q12H   Continuous Infusions:  PRN Meds:.acetaminophen, albuterol, ondansetron (ZOFRAN) IV, RESOURCE THICKENUP CLEAR Medications Prior to Admission:  Prior to Admission medications   Medication Sig Start Date End Date Taking? Authorizing Provider  albuterol (PROVENTIL) (2.5 MG/3ML) 0.083% nebulizer solution Take 3 mLs (2.5  mg total) by nebulization every 6 (six) hours as needed for wheezing or shortness of breath. 11/01/15  Yes Orson Eva, MD  ALPRAZolam Duanne Moron) 0.5 MG tablet Take 0.25 mg by mouth 3 (three) times daily.  10/17/15  Yes Historical Provider, MD  amLODipine (NORVASC) 5 MG tablet Take 5 mg by mouth daily. 08/06/15  Yes Historical Provider, MD  Ascorbic Acid (VITAMIN C PO) Take 1 tablet by mouth daily.   Yes Historical Provider, MD  Aspirin 81 MG EC tablet Take 81 mg by mouth daily.   Yes Historical Provider, MD  calcium carbonate (TUMS - DOSED IN MG ELEMENTAL CALCIUM) 500 MG chewable tablet Chew 1 tablet by mouth daily as needed for indigestion or heartburn.   Yes Historical Provider, MD  cefdinir (OMNICEF) 300 MG capsule Take 1 capsule (300 mg total) by mouth 2 (two) times daily. 11/01/15  Yes Orson Eva, MD  chlorpheniramine (CHLOR-TRIMETON) 4 MG tablet Take  4 mg by mouth 2 (two) times daily as needed for allergies.   Yes Historical Provider, MD  fluconazole (DIFLUCAN) 150 MG tablet Take 150 mg by mouth once as needed. Yeast infection 11/18/15  Yes Historical Provider, MD  levothyroxine (SYNTHROID, LEVOTHROID) 50 MCG tablet Take 1 tablet (50 mcg total) by mouth daily. 01/29/14  Yes Daniel J Angiulli, PA-C  lisinopril (PRINIVIL,ZESTRIL) 5 MG tablet Take 5 mg by mouth daily. Take one tablet daily   Yes Historical Provider, MD  meclizine (ANTIVERT) 12.5 MG tablet TAKE 1-2 TABS BY MOUTH EVERY 6 HOURS AS NEEDED FOR DIZZINESS 10/24/15  Yes Historical Provider, MD  olopatadine (PATANOL) 0.1 % ophthalmic solution Place 1 drop into both eyes daily as needed for allergies.  10/12/15  Yes Historical Provider, MD  pantoprazole (PROTONIX) 40 MG tablet Take 40 mg by mouth daily. 11/10/15  Yes Historical Provider, MD  PATADAY 0.2 % SOLN Place 1 drop into both eyes daily. 10/09/15  Yes Historical Provider, MD  propranolol ER (INDERAL LA) 60 MG 24 hr capsule Take 1 capsule (60 mg total) by mouth daily. 01/29/14  Yes Daniel J Angiulli, PA-C  ranitidine (ZANTAC) 300 MG tablet Take 300 mg by mouth at bedtime. 10/17/15  Yes Historical Provider, MD  azithromycin (ZITHROMAX) 500 MG tablet Take 1 tablet (500 mg total) by mouth daily. Patient not taking: Reported on 11/30/2015 11/01/15   Orson Eva, MD   Allergies  Allergen Reactions  . Apple Flavor Hives    No apple sauce  . Peanut Butter Flavor Hives  . Cephalexin Other (See Comments)    dizziness  . Crestor [Rosuvastatin Calcium]   . Sulfa Drugs Cross Reactors Rash    Review of Systems  Physical Exam  Vital Signs: BP 114/39 mmHg  Pulse 81  Temp(Src) 98.2 F (36.8 C) (Axillary)  Resp 20  Ht 5' 2" (1.575 m)  Wt 84.7 kg (186 lb 11.7 oz)  BMI 34.14 kg/m2  SpO2 100%  SpO2: SpO2: 100 % O2 Device:SpO2: 100 % O2 Flow Rate: .O2 Flow Rate (L/min): 2 L/min  IO: Intake/output summary:  Intake/Output Summary (Last 24  hours) at 12/13/15 2331 Last data filed at 12/13/15 2136  Gross per 24 hour  Intake    350 ml  Output    550 ml  Net   -200 ml    LBM: Last BM Date: 12/13/15 Baseline Weight: Weight: 85.276 kg (188 lb) Most recent weight: Weight: 84.7 kg (186 lb 11.7 oz)      Palliative Assessment/Data:  Flowsheet Rows  Most Recent Value   Intake Tab    Referral Department  Hospitalist   Unit at Time of Referral  Med/Surg Unit   Palliative Care Primary Diagnosis  Neurology   Date Notified  12/11/15   Palliative Care Type  New Palliative care   Reason for referral  Clarify Goals of Care   Date of Admission  11/30/15   # of days IP prior to Palliative referral  11   Clinical Assessment    Palliative Performance Scale Score  30%   Pain Max last 24 hours  6   Pain Min Last 24 hours  6   Dyspnea Max Last 24 Hours  0   Dyspnea Min Last 24 hours  0   Nausea Max Last 24 Hours  0   Nausea Min Last 24 Hours  0   Anxiety Max Last 24 Hours  0   Anxiety Min Last 24 Hours  10   Other Max Last 24 Hours  10   Psychosocial & Spiritual Assessment    Palliative Care Outcomes    Patient/Family meeting held?  Yes   Who was at the meeting?  Son and grandaughter at bedside with patient   Palliative Care Outcomes  Improved pain interventions, Provided psychosocial or spiritual support, Improved non-pain symptom therapy   Palliative Care follow-up planned  Yes, Facility      Additional Data Reviewed:  CBC:    Component Value Date/Time   WBC 15.9* 12/13/2015 0707   HGB 8.5* 12/13/2015 0707   HCT 26.7* 12/13/2015 0707   PLT 279 12/13/2015 0707   MCV 88.1 12/13/2015 0707   NEUTROABS 6.8 12/03/2015 0840   LYMPHSABS 2.3 12/03/2015 0840   MONOABS 0.9 12/03/2015 0840   EOSABS 0.2 12/03/2015 0840   BASOSABS 0.0 12/03/2015 0840   Comprehensive Metabolic Panel:    Component Value Date/Time   NA 140 12/13/2015 0707   K 3.0* 12/13/2015 0707   CL 95* 12/13/2015 0707   CO2 39* 12/13/2015 0707   BUN  17 12/13/2015 0707   CREATININE 0.84 12/13/2015 0707   GLUCOSE 126* 12/13/2015 0707   CALCIUM 9.0 12/13/2015 0707   AST 34 12/10/2015 0440   ALT 18 12/10/2015 0440   ALKPHOS 59 12/10/2015 0440   BILITOT 0.4 12/10/2015 0440   PROT 5.4* 12/10/2015 0440   ALBUMIN 2.4* 12/10/2015 0440     Time In: 1PM   Time Out: 145PM Time Total: 45 minutes Greater than 50%  of this time was spent counseling and coordinating care related to the above assessment and plan.  Signed by: Roma Schanz, DO  12/13/2015, 11:31 PM  Please contact Palliative Medicine Team phone at [phone] for questions and concerns.

## 2015-12-13 NOTE — Clinical Social Work Note (Signed)
Clinical Social Worker met with pt's dtr at bedside in reference to bed offers. Pt's dtrs planning to tour facilities and contact CSW after decision has been made.  CSW remains available as needed.  Glendon Axe, MSW, LCSWA [phone] 12/13/2015 11:19 AM

## 2015-12-13 NOTE — Care Management Important Message (Signed)
Important Message  Patient Details  Name: Tammy Fernandez MRN: [id number] Date of Birth: October 11, 1924   Medicare Important Message Given:  Yes    Kyla BalzarineShealy, Alla Sloma Abena 12/13/2015, 4:12 PM

## 2015-12-13 NOTE — Progress Notes (Signed)
Speech Language Pathology Treatment: Dysphagia  Patient Details Name: Tammy Fernandez MRN: [id number] DOB: [birth year] Today's Date: 12/13/2015 Time: [id number] SLP Time Calculation (min) (ACUTE ONLY): 24 min  Assessment / Plan / Recommendation Clinical Impression  Pt demonstrates much improved arousal and awareness of PO intake today, making verbal requests for food and drink and orally transiting and swallowing in a timely manner. When pts mentation is good, risk of aspiration is low. Will follow to determine if pt continues to fluctuate or if mentation remains relatively good, will attempt upgrade to thin liquids and firmer textures.    HPI HPI: 82 yo F Hx Anxiety, Abnormal Heart Rhythms, HLD, HTN, Chronic Respiratory Failure on home2 L O2, and CVA 2015 who was [facility] due to being altered and less interactive. She had been having vomiting and diarrhea intermittently since late November. Metabolic encephalopathy due to hyponatremia along with Klebsiella UTI. SLP evaluated pt on 12/29, found to have normal swallow function, recommended to consume regular/thin. Pt has been confused and at times nonverbal/lethargic during admission, diet was downgraded by RN/MD to puree on 12/08/14 and PO intake is reportedly poor.  MD concerned about aspiration risk. Most recent CXR shows cardiac enlargement and moderate vascular congestion without overt pulmonary edema.       SLP Plan  Continue with current plan of care     Recommendations  Diet recommendations: Dysphagia 1 (puree);Nectar-thick liquid Liquids provided via: Cup Medication Administration: Crushed with puree Supervision: Staff to assist with self feeding Compensations: Slow rate;Small sips/bites Postural Changes and/or Swallow Maneuvers: Seated upright 90 degrees              Plan: Continue with current plan of care  [facility] Peak Regional HospitalBonnie Jaelynne Hockley, MA CCC-SLP [phone]  [name], Deadra Diggins [name] 12/13/2015, 1:21 PM

## 2015-12-13 NOTE — Progress Notes (Signed)
Subjective: She has been talking a lot overnight, still with some confusion.   Exam: Filed Vitals:   12/13/15 0117 12/13/15 0631  BP: 145/96 113/33  Pulse: 76 74  Temp: 98.2 F (36.8 C) 98.1 F (36.7 C)  Resp: 18 18    Gen: in bed,  GU: foley in place  Gen: In bed, NAD MS: Speech is clear, complaining about pain in her "cootie" and  CN: PERRLA, EOMI, TML, Face equal, Speech clear Motor: MAEW Sensory: intact    Pertinent Labs: Continued leukocytosis.    Impression: 82 yo F with acute encephalopathy due to UTI. She is improving with treatment. I suspect that this is a infectious/hospital related delirium that will continue to improve. If agitation at night continues to be a problem, then could consider low dose seroquel. I would also consider re-introducing home pain/anxiety meds as withdrawal could be playing some role as well.    Recommendations: 1) Please call with further questions or concerns.    Ritta SlotMcNeill Reakwon Barren, MD [facility] [phone]  If 7pm- 7am, please page neurology on call as listed in AMION.  12/13/2015, 9:16 AM

## 2015-12-13 NOTE — Progress Notes (Signed)
TRH PROGRESS NOTE  Tammy Fernandez ZOX:[id number] DOB: October 15, 1924 DOA: 11/30/2015 PCP: [name], MD  Admit HPI / Brief Narrative:  82 yo F Hx Anxiety, Abnormal Heart Rhythms, HLD, HTN, Chronic Respiratory Failure on home2 L O2, and CVA 2015 who was brought to [facility] ED 12/28 due to being altered and less interactive.  She had been having vomiting and diarrhea intermittently since late November (home health RN apparently tested her for C.diff which was negative). She began to have tremor like movements as well and felt dizzy whenever she would ambulate.  Her confusion worsened and this prompted her son to bring her to ED.    In the ED she was found to be hyponatremic with Na of 111. CT of the head was negative for acute process.   HPI/Subjective:   Elderly morbidly obese African-American female lying in bed in no distress but appears lethargic, able to follow basic commands, will not answer questions, denies any headache, denies any chest or abdominal pain.         Assessment/Plan:  AMS  -Metabolic encephalopathy due to hyponatremia along with Klebsiella UTI. MRI brain done on 12/05/2015 nonacute, stable ammonia and B12/folte, Stable TSH and RPR. We will also check EEG to rule out seizures. Neurology has been consulted on 12/10/2015. She remains high risk for aspiration. Although chest x-rays are not very impressive I think she is having microaspiration as she's developed a low-grade temperature with leukocytosis, will reconsult speech, Overall prognosis does not appear good due to ongoing encephalopathy now close to 11 days in the hospital. Explained to the son in detail.   Hyponatremia  Clinically appears to be due to dehydration, urine sodium less than 10, improved with hydration.   Klebsiella UTI urinary retention -He has completed 7 days of Rocephin, since she has developed urinary retention Foley was replaced on 12/10/2015. Already on Flomax. Will have outpatient  urology follow-up and will be discharged with Foley. She has remote history of hematuria.   Acute on chronic hypoxic respiratory failure  -she was admitted in Nov 2016 for CAP and "has required supplemental O2 since"  -continue supplemental O2 as needed to maintain SpO2 > 92% - presently her saturations are near 100% on only 2 L - oxygen now as needed    HTN -likely somewhat driven by agitation - adjust medical tx and follow     Hypothyroidism w/ over replacement  -Low TSH - Free T4 slightly elevated - slightly decreased dose of synthroid - recheck TSH in 8-12 weeks    Low-grade temperature with leukocytosis developing since 12/11/2015.  Repeat UA appears unremarkable, question if she is having microaspiration, speech reevaluation done and cleared, chest x-ray remains unimpressive although she has chronic right-sided hemidiaphragm elevation which could be hiding an infiltrate, central line was discontinued on 12/12/2015, she then another temp of over 100.9 of 12/12/2015 at which point I have repeated blood cultures, placed her on empiric antibiotics for possible HCAP. Will monitor.   Deconditioning -PT/OT      Code Status: FULL Family Communication: Spoke with multiple family members more than 3 times, explained poor prognosis and to consider comfort measures.   Disposition Plan: transfer to med bed - PT/OT - wean O2 - follow mental status - follow Na   Consultants: PCCM, Neuro  Procedures: R.IJ C Line in ICU, discontinued on 12/12/2015  Antibiotics:  Anti-infectives    Start     Dose/Rate Route Frequency Ordered Stop   12/13/15 0630  vancomycin (VANCOCIN)  500 mg in sodium chloride 0.9 % 100 mL IVPB     500 mg 100 mL/hr over 60 Minutes Intravenous Every 12 hours 12/13/15 0622     12/13/15 0630  piperacillin-tazobactam (ZOSYN) IVPB 3.375 g     3.375 g 12.5 mL/hr over 240 Minutes Intravenous 3 times per day 12/13/15 0622     12/06/15 0800  cefTRIAXone (ROCEPHIN) 1 g in  dextrose 5 % 50 mL IVPB  Status:  Discontinued     1 g 100 mL/hr over 30 Minutes Intravenous Every 24 hours 12/05/15 1758 12/09/15 0830   12/03/15 0800  cefTRIAXone (ROCEPHIN) 2 g in dextrose 5 % 50 mL IVPB  Status:  Discontinued     2 g 100 mL/hr over 30 Minutes Intravenous Every 24 hours 12/03/15 0753 12/05/15 1758       DVT prophylaxis: SQ heparin   Objective: Blood pressure 113/33, pulse 74, temperature 98.1 F (36.7 C), temperature source Oral, resp. rate 18, height 5\' 2"  (1.575 m), weight 84.7 kg (186 lb 11.7 oz), SpO2 99 %.  Intake/Output Summary (Last 24 hours) at 12/13/15 1135 Last data filed at 12/13/15 [id number]  Gross per 24 hour  Intake    360 ml  Output    800 ml  Net   -440 ml   Exam: General: No acute respiratory distress - alert and more talkative today, following basic commands Lungs: Clear to auscultation bilaterally - no wheeze  Cardiovascular: Regular rate and rhythm without murmur   Abdomen: Nontender, nondistended, soft, bowel sounds positive, no rebound, no ascites, no appreciable mass Extremities: No significant cyanosis, clubbing, or edema bilateral lower extremities  Data Reviewed:  Basic Metabolic Panel:  Recent Labs Lab 12/09/15 0845 12/10/15 0440 12/11/15 0522 12/12/15 0552 12/13/15 0707  NA 127* 128* 130* 134* 140  K 3.8 3.8 3.4* 3.4* 3.0*  CL 76* 78* 80* 88* 95*  CO2 44* 41* 35* 32 39*  GLUCOSE 125* 96 77 88 126*  BUN 6 11 10 10 17   CREATININE 0.57 0.75 0.82 0.88 0.84  CALCIUM 9.6 9.4 9.5 9.4 9.0  MG  --   --   --  1.7  --     CBC:  Recent Labs Lab 12/08/15 0430 12/10/15 0440 12/11/15 0522 12/12/15 0552 12/13/15 0707  WBC 12.9* 9.6 11.3* 14.6* 15.9*  HGB 9.4* 8.6* 9.7* 9.5* 8.5*  HCT 29.4* 26.9* 30.5* 30.1* 26.7*  MCV 84.5 84.1 85.7 86.0 88.1  PLT 259 300 332 352 279   Liver Function Tests:  Recent Labs Lab 12/10/15 0440  AST 34  ALT 18  ALKPHOS 59  BILITOT 0.4  PROT 5.4*  ALBUMIN 2.4*    Recent Results (from  the past 240 hour(s))  MRSA PCR Screening     Status: None   Collection Time: 12/06/15 12:24 PM  Result Value Ref Range Status   MRSA by PCR NEGATIVE NEGATIVE Final    Comment:        The GeneXpert MRSA Assay (FDA approved for NASAL specimens only), is one component of a comprehensive MRSA colonization surveillance program. It is not intended to diagnose MRSA infection nor to guide or monitor treatment for MRSA infections.   Urine culture     Status: None   Collection Time: 12/11/15 12:15 PM  Result Value Ref Range Status   Specimen Description URINE, CATHETERIZED  Final   Special Requests NONE  Final   Culture 9,000 COLONIES/mL INSIGNIFICANT GROWTH  Final   Report Status 12/12/2015 FINAL  Final  Culture, blood (routine x 2)     Status: None (Preliminary result)   Collection Time: 12/13/15  7:07 AM  Result Value Ref Range Status   Specimen Description BLOOD LEFT HAND  Final   Special Requests BOTTLES DRAWN AEROBIC AND ANAEROBIC 10CC  Final   Culture PENDING  Incomplete   Report Status PENDING  Incomplete  Culture, blood (routine x 2)     Status: None (Preliminary result)   Collection Time: 12/13/15  7:18 AM  Result Value Ref Range Status   Specimen Description BLOOD RIGHT HAND  Final   Special Requests IN PEDIATRIC BOTTLE 2CC  Final   Culture PENDING  Incomplete   Report Status PENDING  Incomplete     Studies:   Recent x-ray studies have been reviewed in detail by the Attending Physician  Scheduled Meds:  Scheduled Meds: . ALPRAZolam  0.25 mg Oral BID  . amantadine  50 mg Oral Daily  . amLODipine  10 mg Oral Daily  . antiseptic oral rinse  7 mL Mouth Rinse q12n4p  . aspirin EC  81 mg Oral Daily  . chlorhexidine  15 mL Mouth Rinse BID  . famotidine  20 mg Oral Daily  . feeding supplement (PRO-STAT SUGAR FREE 64)  30 mL Oral Daily  . heparin  5,000 Units Subcutaneous 3 times per day  . levothyroxine  37.5 mcg Oral QAC breakfast  . lisinopril  10 mg Oral Daily  .  multivitamin with minerals  1 tablet Oral Daily  . olopatadine  1 drop Both Eyes Daily  . pantoprazole  40 mg Oral Daily  . piperacillin-tazobactam (ZOSYN)  IV  3.375 g Intravenous 3 times per day  . propranolol ER  80 mg Oral Daily  . QUEtiapine  12.5 mg Oral QHS  . tamsulosin  0.4 mg Oral Daily  . vancomycin  500 mg Intravenous Q12H    Time spent on care of this patient: 35 mins  Signature  Susa Raring K M.D on 12/13/2015 at 11:35 AM  Between 7am to 7pm - Pager - [phone], After 7pm go to [email or web address] - password [facility]  [facility]  - Office  [phone]   LOS: 13 days

## 2015-12-13 NOTE — Progress Notes (Signed)
ANTIBIOTIC CONSULT NOTE - INITIAL  Pharmacy Consult for Vancomycin/Zosyn  Indication: rule out pneumonia  Allergies  Allergen Reactions  . Apple Flavor Hives    No apple sauce  . Peanut Butter Flavor Hives  . Cephalexin Other (See Comments)    dizziness  . Crestor [Rosuvastatin Calcium]   . Sulfa Drugs Cross Reactors Rash   Patient Measurements: Height: 5\' 2"  (157.5 cm) Weight: 188 lb 4.4 oz (85.4 kg) IBW/kg (Calculated) : 50.1  Vital Signs: Temp: 98.2 F (36.8 C) (01/10 0117) Temp Source: Oral (01/10 0117) BP: 145/96 mmHg (01/10 0117) Pulse Rate: 76 (01/10 0117)  Labs:  Recent Labs  12/11/15 0522 12/12/15 0552  WBC 11.3* 14.6*  HGB 9.7* 9.5*  PLT 332 352  CREATININE 0.82 0.88   Estimated Creatinine Clearance: 43.9 mL/min (by C-G formula based on Cr of 0.88). No results for input(s): VANCOTROUGH, VANCOPEAK, VANCORANDOM, GENTTROUGH, GENTPEAK, GENTRANDOM, TOBRATROUGH, TOBRAPEAK, TOBRARND, AMIKACINPEAK, AMIKACINTROU, AMIKACIN in the last 72 hours.   Microbiology: Recent Results (from the past 720 hour(s))  Culture, Urine     Status: None   Collection Time: 12/02/15  9:04 AM  Result Value Ref Range Status   Specimen Description URINE, CATHETERIZED  Final   Special Requests NONE  Final   Culture NO GROWTH 1 DAY  Final   Report Status 12/03/2015 FINAL  Final  Culture, Urine     Status: None   Collection Time: 12/03/15  8:58 AM  Result Value Ref Range Status   Specimen Description URINE, RANDOM  Final   Special Requests NONE  Final   Culture >=100,000 COLONIES/mL KLEBSIELLA PNEUMONIAE  Final   Report Status 12/05/2015 FINAL  Final   Organism ID, Bacteria KLEBSIELLA PNEUMONIAE  Final      Susceptibility   Klebsiella pneumoniae - MIC*    AMPICILLIN 16 RESISTANT Resistant     CEFAZOLIN <=4 SENSITIVE Sensitive     CEFTRIAXONE <=1 SENSITIVE Sensitive     CIPROFLOXACIN <=0.25 SENSITIVE Sensitive     GENTAMICIN <=1 SENSITIVE Sensitive     IMIPENEM <=0.25 SENSITIVE  Sensitive     NITROFURANTOIN 32 SENSITIVE Sensitive     TRIMETH/SULFA <=20 SENSITIVE Sensitive     AMPICILLIN/SULBACTAM <=2 SENSITIVE Sensitive     PIP/TAZO <=4 SENSITIVE Sensitive     * >=100,000 COLONIES/mL KLEBSIELLA PNEUMONIAE  MRSA PCR Screening     Status: None   Collection Time: 12/06/15 12:24 PM  Result Value Ref Range Status   MRSA by PCR NEGATIVE NEGATIVE Final    Comment:        The GeneXpert MRSA Assay (FDA approved for NASAL specimens only), is one component of a comprehensive MRSA colonization surveillance program. It is not intended to diagnose MRSA infection nor to guide or monitor treatment for MRSA infections.   Urine culture     Status: None   Collection Time: 12/11/15 12:15 PM  Result Value Ref Range Status   Specimen Description URINE, CATHETERIZED  Final   Special Requests NONE  Final   Culture 9,000 COLONIES/mL INSIGNIFICANT GROWTH  Final   Report Status 12/12/2015 FINAL  Final    Medical History: Past Medical History  Diagnosis Date  . Hyperlipidemia   . Hypertension   . Anxiety   . GERD (gastroesophageal reflux disease)   . Abnormal heart rhythms   . Fatty liver   . Internal hemorrhoid     Assessment: 82 y/o F with increasing WBC count today, Tmax 101, renal function good, starting broad spectrum anti-biotics  Goal  of Therapy:  Vancomycin trough level 15-20 mcg/ml  Plan:  -Vancomycin 500 mg IV q12h -Zosyn 3.375G IV q8h to be infused over 4 hours -Trend WBC, temp, renal function  -Drug levels as indicated   Abran Duke 12/13/2015,6:17 AM

## 2015-12-14 DIAGNOSIS — E038 Other specified hypothyroidism: Secondary | ICD-10-CM

## 2015-12-14 LAB — CBC
HCT: 26.4 % — ABNORMAL LOW (ref 36.0–46.0)
Hemoglobin: 8.2 g/dL — ABNORMAL LOW (ref 12.0–15.0)
MCH: 28.1 pg (ref 26.0–34.0)
MCHC: 31.1 g/dL (ref 30.0–36.0)
MCV: 90.4 fL (ref 78.0–100.0)
PLATELETS: 289 10*3/uL (ref 150–400)
RBC: 2.92 MIL/uL — AB (ref 3.87–5.11)
RDW: 16.1 % — ABNORMAL HIGH (ref 11.5–15.5)
WBC: 12.9 10*3/uL — ABNORMAL HIGH (ref 4.0–10.5)

## 2015-12-14 LAB — BASIC METABOLIC PANEL
Anion gap: 6 (ref 5–15)
BUN: 16 mg/dL (ref 6–20)
CALCIUM: 9.4 mg/dL (ref 8.9–10.3)
CO2: 36 mmol/L — AB (ref 22–32)
CREATININE: 0.93 mg/dL (ref 0.44–1.00)
Chloride: 98 mmol/L — ABNORMAL LOW (ref 101–111)
GFR calc Af Amer: >60 mL/min (ref >60)
GFR, EST NON AFRICAN AMERICAN: 53 mL/min — AB (ref >60)
GLUCOSE: 150 mg/dL — AB (ref 65–99)
Potassium: 3.6 mmol/L (ref 3.5–5.1)
Sodium: 140 mmol/L (ref 135–145)

## 2015-12-14 LAB — MAGNESIUM: Magnesium: 1.7 mg/dL (ref 1.7–2.4)

## 2015-12-14 MED ORDER — MECLIZINE HCL 12.5 MG PO TABS
25.0000 mg | ORAL_TABLET | Freq: Once | ORAL | Status: AC
  Administered 2015-12-15: 25 mg via ORAL
  Filled 2015-12-14: qty 2

## 2015-12-14 MED ORDER — ALPRAZOLAM 0.25 MG PO TABS: 0.2500 mg | ORAL_TABLET | Freq: Two times a day (BID) | ORAL | Status: DC

## 2015-12-14 NOTE — Clinical Social Work Note (Signed)
Patient has a bed at [facility], [facility] and [facility]. CSW met with entire family to further discuss discharge planning and answer questions/concerns.   Pt has very strong family support. Family pleasant and appreciated social work intervention.  CSW remains available as needed.   Glendon Axe, MSW, LCSWA [phone] 12/14/2015 6:10 PM

## 2015-12-14 NOTE — Progress Notes (Signed)
TRH PROGRESS NOTE  Tammy Fernandez ZOX:[id number] DOB: 12-24-1923 DOA: 11/30/2015 PCP: Tammy PatellaEADE,Tammy ALEXANDER, MD  Admit HPI / Brief Narrative:  82 yo F Hx Anxiety, Abnormal Heart Rhythms, HLD, HTN, Chronic Respiratory Failure on home2 L O2, and CVA 2015 who was [facility] ED 12/28 due to being altered and less interactive.  She had been having vomiting and diarrhea intermittently since late November (home health RN apparently tested her for C.diff which was negative). She began to have tremor like movements as well and felt dizzy whenever she would ambulate.  Her confusion worsened and this prompted her son to bring her to ED.    In the ED she was found to be hyponatremic with Na of 111. CT of the head was negative for acute process.   HPI/Subjective:   Elderly morbidly obese African-American female lying in bed in no distress but appears lethargic, able to follow basic commands, will not answer questions, denies any headache, denies any chest or abdominal pain.         Assessment/Plan:  AMS   -Metabolic encephalopathy due to hyponatremia along with Klebsiella UTI and now microaspiration with HCAP.   MRI brain done on 12/05/2015 nonacute, stable ammonia and B12/folte, Stable TSH and RPR. EEG non acute. Neurology has been consulted on 12/10/2015. She remains high risk for aspiration. Although chest x-rays are not very impressive I think she is having microaspiration as she's developed a low-grade temperature with leukocytosis, speech following, she was placed on empiric antibiotics on 12/13/2015 for HCAP with good improvement in her leukocytosis and temperature curve. Cultures negative so far. Continue to monitor with supportive care. Cut back on her home dose benzodiazepine, monitor on supportive care.  Overall prognosis does not appear good due to ongoing encephalopathy now close to 14 days in the hospital. Explained to the family in detail. Family continues to have unreasonable  expectation.   Hyponatremia  Due to dehydration, resolved after IV fluids.   Klebsiella UTI urinary retention -He has completed 7 days of Rocephin, since she has developed urinary retention Foley was replaced on 12/10/2015. Already on Flomax. Will have outpatient urology follow-up and will be discharged with Foley. She has remote history of hematuria.   Acute on chronic hypoxic respiratory failure  -she was admitted in Nov 2016 for CAP and "has required supplemental O2 since"  -continue supplemental O2 as needed to maintain SpO2 > 92% - presently her saturations are near 100% on only 2 L - oxygen now as needed    HTN -likely somewhat driven by agitation - adjust medical tx and follow     Hypothyroidism w/ over replacement  -Low TSH - Free T4 slightly elevated - slightly decreased dose of synthroid - recheck TSH in 8-12 weeks    Low-grade temperature with leukocytosis developing since 12/11/2015.  Repeat UA appears unremarkable, likely microaspiration with HCAP. Placed on empiric antibiotics on 12/13/2015 with improvement. Speech following.   Deconditioning -PT/OT      Code Status: FULL Family Communication: Spoke with multiple family members more than 3 times, explained poor prognosis and to consider comfort measures.   Disposition Plan: transfer to med bed - PT/OT - wean O2 - follow mental status - follow Na   Consultants: PCCM, Neuro  Procedures: R.IJ C Line in ICU, discontinued on 12/12/2015  Antibiotics:  Anti-infectives    Start     Dose/Rate Route Frequency Ordered Stop   12/13/15 0630  vancomycin (VANCOCIN) 500 mg in sodium chloride 0.9 %  100 mL IVPB     500 mg 100 mL/hr over 60 Minutes Intravenous Every 12 hours 12/13/15 0622     12/13/15 0630  piperacillin-tazobactam (ZOSYN) IVPB 3.375 g     3.375 g 12.5 mL/hr over 240 Minutes Intravenous 3 times per day 12/13/15 0622     12/06/15 0800  cefTRIAXone (ROCEPHIN) 1 g in dextrose 5 % 50 mL IVPB  Status:   Discontinued     1 g 100 mL/hr over 30 Minutes Intravenous Every 24 hours 12/05/15 1758 12/09/15 0830   12/03/15 0800  cefTRIAXone (ROCEPHIN) 2 g in dextrose 5 % 50 mL IVPB  Status:  Discontinued     2 g 100 mL/hr over 30 Minutes Intravenous Every 24 hours 12/03/15 0753 12/05/15 1758       DVT prophylaxis: SQ heparin   Objective: Blood pressure 105/34, pulse 72, temperature 98.7 F (37.1 C), temperature source Oral, resp. rate 18, height 5\' 2"  (1.575 m), weight 84.7 kg (186 lb 11.7 oz), SpO2 100 %.  Intake/Output Summary (Last 24 hours) at 12/14/15 1054 Last data filed at 12/14/15 0400  Gross per 24 hour  Intake    350 ml  Output    451 ml  Net   -101 ml   Exam: General: No acute respiratory distress - alert and more talkative today, following basic commands Lungs: Clear to auscultation bilaterally - no wheeze  Cardiovascular: Regular rate and rhythm without murmur   Abdomen: Nontender, nondistended, soft, bowel sounds positive, no rebound, no ascites, no appreciable mass Extremities: No significant cyanosis, clubbing, or edema bilateral lower extremities  Data Reviewed:  Basic Metabolic Panel:  Recent Labs Lab 12/10/15 0440 12/11/15 0522 12/12/15 0552 12/13/15 0707 12/14/15 0536  NA 128* 130* 134* 140 140  K 3.8 3.4* 3.4* 3.0* 3.6  CL 78* 80* 88* 95* 98*  CO2 41* 35* 32 39* 36*  GLUCOSE 96 77 88 126* 150*  BUN 11 10 10 17 16   CREATININE 0.75 0.82 0.88 0.84 0.93  CALCIUM 9.4 9.5 9.4 9.0 9.4  MG  --   --  1.7  --  1.7    CBC:  Recent Labs Lab 12/10/15 0440 12/11/15 0522 12/12/15 0552 12/13/15 0707 12/14/15 0536  WBC 9.6 11.3* 14.6* 15.9* 12.9*  HGB 8.6* 9.7* 9.5* 8.5* 8.2*  HCT 26.9* 30.5* 30.1* 26.7* 26.4*  MCV 84.1 85.7 86.0 88.1 90.4  PLT 300 332 352 279 289   Liver Function Tests:  Recent Labs Lab 12/10/15 0440  AST 34  ALT 18  ALKPHOS 59  BILITOT 0.4  PROT 5.4*  ALBUMIN 2.4*    Recent Results (from the past 240 hour(s))  MRSA PCR  Screening     Status: None   Collection Time: 12/06/15 12:24 PM  Result Value Ref Range Status   MRSA by PCR NEGATIVE NEGATIVE Final    Comment:        The GeneXpert MRSA Assay (FDA approved for NASAL specimens only), is one component of a comprehensive MRSA colonization surveillance program. It is not intended to diagnose MRSA infection nor to guide or monitor treatment for MRSA infections.   Urine culture     Status: None   Collection Time: 12/11/15 12:15 PM  Result Value Ref Range Status   Specimen Description URINE, CATHETERIZED  Final   Special Requests NONE  Final   Culture 9,000 COLONIES/mL INSIGNIFICANT GROWTH  Final   Report Status 12/12/2015 FINAL  Final  Culture, blood (routine x 2)  Status: None (Preliminary result)   Collection Time: 12/13/15  7:07 AM  Result Value Ref Range Status   Specimen Description BLOOD LEFT HAND  Final   Special Requests BOTTLES DRAWN AEROBIC AND ANAEROBIC 10CC  Final   Culture PENDING  Incomplete   Report Status PENDING  Incomplete  Culture, blood (routine x 2)     Status: None (Preliminary result)   Collection Time: 12/13/15  7:18 AM  Result Value Ref Range Status   Specimen Description BLOOD RIGHT HAND  Final   Special Requests IN PEDIATRIC BOTTLE 2CC  Final   Culture PENDING  Incomplete   Report Status PENDING  Incomplete     Studies:   Recent x-ray studies have been reviewed in detail by the Attending Physician  Scheduled Meds:  Scheduled Meds: . [START ON 12/15/2015] ALPRAZolam  0.25 mg Oral BID  . amLODipine  10 mg Oral Daily  . antiseptic oral rinse  7 mL Mouth Rinse q12n4p  . aspirin EC  81 mg Oral Daily  . bisacodyl  10 mg Rectal Daily  . chlorhexidine  15 mL Mouth Rinse BID  . enoxaparin (LOVENOX) injection  40 mg Subcutaneous Q24H  . famotidine  20 mg Oral Daily  . feeding supplement (PRO-STAT SUGAR FREE 64)  30 mL Oral Daily  . levothyroxine  37.5 mcg Oral QAC breakfast  . lisinopril  10 mg Oral Daily  .  mirtazapine  7.5 mg Oral QHS  . olopatadine  1 drop Both Eyes Daily  . pantoprazole  40 mg Oral Daily  . piperacillin-tazobactam (ZOSYN)  IV  3.375 g Intravenous 3 times per day  . propranolol ER  80 mg Oral Daily  . tamsulosin  0.4 mg Oral Daily  . vancomycin  500 mg Intravenous Q12H    Time spent on care of this patient: 35 mins  Signature  Leroy Sea M.D on 12/14/2015 at 10:54 AM  Between 7am to 7pm - Pager - [phone], After 7pm go to [email or web address] - password [facility]  [facility]  - Office  [phone]   LOS: 14 days

## 2015-12-14 NOTE — Progress Notes (Signed)
Called by RN for PRN nebulizer. On assessment of patient, BBS clear/diminished no wheezes or rhonchi heard. Pt in no distress. Neb not indicated at this time.

## 2015-12-14 NOTE — Progress Notes (Signed)
Physical Therapy Treatment Patient Details Name: Tammy Fernandez MRN: [id number] DOB: 03/07/1924 Today's Date: 12/14/2015    History of Present Illness Pt is a 82 y/o F admitted due to AMS and less interactive as usual.  CT of head negative for acute process.  Dx: hyponatremia.  Pt's PMH includes CVA in 2015, anxiety.    PT Comments    Pt transferred bed to chair with maximove, participated minimally with rolling and positioning in bed and chair. Pt very lethargic this morning. PT will continue to follow 2x/ week.  Follow Up Recommendations  SNF;Supervision/Assistance - 24 hour     Equipment Recommendations  Hospital bed;Wheelchair cushion (measurements PT);Wheelchair (measurements PT)    Recommendations for Other Services       Precautions / Restrictions Precautions Precautions: Fall Restrictions Weight Bearing Restrictions: No    Mobility  Bed Mobility Overal bed mobility: Needs Assistance;+2 for physical assistance Bed Mobility: Rolling Rolling: Max assist;+2 for physical assistance         General bed mobility comments: Max A +2 for rolling right and left, pt assisted minimally with right rolling with head turning and maintenance of SL, she did not assist with rolling to left   Transfers Overall transfer level: Needs assistance Equipment used: Ambulation equipment used             General transfer comment: pt transferred bed to chair with maximove, pt slightly more alert during transfers, hand over hand positioning of UE's   Ambulation/Gait             General Gait Details: unable   Stairs            Wheelchair Mobility    Modified Rankin (Stroke Patients Only)       Balance Overall balance assessment: Needs assistance   Sitting balance-[name]: Zero Sitting balance - Comments: max A to maintain upright sitting in chair before supported by chair back. Holding head to right today.  Postural control: Posterior lean                           Cognition Arousal/Alertness: Lethargic;Suspect due to medications Behavior During Therapy: Flat affect Overall Cognitive Status: Impaired/Different from baseline Area of Impairment: Orientation;Attention;Following commands;Safety/judgement;Awareness;Problem solving Orientation Level: Disoriented to;Place;Time;Situation   Memory: Decreased short-term memory   Safety/Judgement: Decreased awareness of safety;Decreased awareness of deficits   Problem Solving: Slow processing;Decreased initiation;Difficulty sequencing;Requires verbal cues;Requires tactile cues General Comments: pt arouses to loud speaking and tactile stimulation but quickly falls back asleep if not continually stimulated    Exercises Other Exercises Other Exercises: passive ROM to bilateral ankles and knees while in lift    General Comments General comments (skin integrity, edema, etc.): pt's daughter present end of session      Pertinent Vitals/Pain Pain Assessment: Faces Faces Pain Scale: No hurt    Home Living                      Prior Function            PT Goals (current goals can now be found in the care plan section) Acute Rehab PT Goals Patient Stated Goal: None stated PT Goal Formulation: With patient/family Time For Goal Achievement: 12/17/15 Potential to Achieve Goals: Poor Progress towards PT goals: Not progressing toward goals - comment (lethargy)    Frequency  Min 2X/week    PT Plan Current plan remains appropriate    Co-evaluation  End of Session Equipment Utilized During Treatment: Oxygen Activity Tolerance: Patient tolerated treatment well;Patient limited by lethargy Patient left: in chair;with call bell/phone within reach;with family/visitor present;with chair alarm set     Time: [id number] PT Time Calculation (min) (ACUTE ONLY): 32 min  Charges:  $Therapeutic Activity: 23-37 mins                    G Codes:     Lyanne CoVictoria Danylle Ouk, PT   Acute Rehab Services  [phone]  Lyanne CoManess, Jousha Schwandt 12/14/2015, 10:02 AM

## 2015-12-15 MED ORDER — QUETIAPINE FUMARATE 25 MG PO TABS: 25.0000 mg | ORAL_TABLET | Freq: Every day | ORAL | Status: DC

## 2015-12-15 MED ORDER — AMLODIPINE BESYLATE 10 MG PO TABS: 10.0000 mg | ORAL_TABLET | Freq: Every day | ORAL | Status: DC

## 2015-12-15 MED ORDER — ALPRAZOLAM 0.5 MG PO TABS: 0.2500 mg | ORAL_TABLET | Freq: Two times a day (BID) | ORAL | Status: DC

## 2015-12-15 MED ORDER — AMOXICILLIN-POT CLAVULANATE 875-125 MG PO TABS: 1.0000 | ORAL_TABLET | Freq: Two times a day (BID) | ORAL | Status: DC

## 2015-12-15 MED ORDER — LISINOPRIL 10 MG PO TABS: 10.0000 mg | ORAL_TABLET | Freq: Every day | ORAL | Status: DC

## 2015-12-15 MED ORDER — TAMSULOSIN HCL 0.4 MG PO CAPS: 0.4000 mg | ORAL_CAPSULE | Freq: Every day | ORAL | Status: DC

## 2015-12-15 MED ORDER — LEVOTHYROXINE SODIUM 75 MCG PO TABS: 37.5000 ug | ORAL_TABLET | Freq: Every day | ORAL | Status: DC

## 2015-12-15 NOTE — Progress Notes (Signed)
[facility]. Daughter and family agreeable to this plan- plan transfer via EMS. Reece LevyJanet Samarie Pinder, MSW, Theresia MajorsLCSWA [phone]

## 2015-12-15 NOTE — Discharge Summary (Signed)
Tammy Fernandez, is a 82 y.o. female  DOB [birth year]  MRN [id number].  Admission date:  11/30/2015  Admitting Physician  Chilton Greathouse, MD  Discharge Date:  12/15/2015   Primary MD  [name], MD  Recommendations for primary care physician for things to follow:   Continue feeding assistance with dysphagia 1 diet and nectar thick liquids, high risk for aspiration.  Kindly continue to encourage family to consider DO NOT RESUSCITATE and hospice if she declines. Have been clearly explained that her long-term prognosis is not good at all.   Admission Diagnosis  stroke sx    Discharge Diagnosis  stroke sx      Active Problems:   Hyponatremia   Altered mental status   Other specified hypothyroidism   Chronic diarrhea   Physical deconditioning   Essential hypertension   Encounter for central line placement   Anxiety state   Acute on chronic respiratory failure with hypoxia (HCC)   Hypokalemia   Leukocytosis      Past Medical History  Diagnosis Date  . Hyperlipidemia   . Hypertension   . Anxiety   . GERD (gastroesophageal reflux disease)   . Abnormal heart rhythms   . Fatty liver   . Internal hemorrhoid     Past Surgical History  Procedure Laterality Date  . Cesarean section    . Abdominal hysterectomy    . Tee without cardioversion N/A 01/19/2014    Procedure: TRANSESOPHAGEAL ECHOCARDIOGRAM (TEE);  Surgeon: Laurey Morale, MD;  Location: [facility] ENDOSCOPY;  Service: Cardiovascular;  Laterality: N/A;  [name]       HPI  from the history and physical done on the day of admission:   82 yo F Hx Anxiety, Abnormal Heart Rhythms, HLD, HTN, Chronic Respiratory Failure on home2 L O2, and CVA 2015 who was brought to [facility] ED 12/28 due to being altered and less interactive. She had been having  vomiting and diarrhea intermittently since late November (home health RN apparently tested her for C.diff which was negative). She began to have tremor like movements as well and felt dizzy whenever she would ambulate. Her confusion worsened and this prompted her son to bring her to ED.   In the ED she was found to be hyponatremic with Na of 111. CT of the head was negative for acute process.     Hospital Course:    AMS   -Metabolic encephalopathy due to hyponatremia along with Klebsiella UTI and now microaspiration with HCAP.   MRI brain done on 12/05/2015 nonacute, stable ammonia and B12/folte, Stable TSH and RPR. EEG non acute. Neurology was been consulted on 12/10/2015. Irving Burton clearly explained that She remains high risk for aspiration. Although chest x-rays are not very impressive I think she is having microaspiration as she's developed a low-grade temperature with leukocytosis, speech following, she was placed on empiric antibiotics on 12/13/2015 for HCAP with good improvement in her leukocytosis and temperature curve. Cultures negative so far. Continue to monitor with supportive care. Cut back on her  home dose benzodiazepine, monitor on supportive care.  Overall prognosis does not appear good due to ongoing encephalopathy now close to 14 days in the hospital. Explained to the family in detail. Family continues to have unreasonable expectation.   Klebsiella UTI urinary retention -He has completed 7 days of Rocephin, since she has developed urinary retention Foley was replaced on 12/10/2015. Already on Flomax. Will have outpatient urology follow-up and will be discharged with Foley. She has remote history of hematuria.   Acute on chronic hypoxic respiratory failure  -Due to aspiration and HCAP, much improved, continue supportive care with oxygen 2 L nasal cannula at all times along with nebulizer treatment as needed. Finish antibiotics treatment with Augmentin and 5 more  days.   HTN -Stable on present regimen    Hypothyroidism w/ over replacement  -Low TSH - Free T4 slightly elevated - slightly decreased dose of synthroid - recheck TSH in 5 weeks    Low-grade temperature with leukocytosis developing since 12/11/2015.  Repeat UA appears unremarkable, likely microaspiration with HCAP. Placed on empiric antibiotics on 12/13/2015 with improvement. Speech following. Continue on dysphagia 1 diet with nectar thick liquids, full feeding assistance and aspiration precautions. Placed on Augmentin for 5 more days upon discharge. Remains very high risk for repeated aspiration. Explained to the family in detail.   Hyponatremia  Due to dehydration, resolved after IV fluids   Delirium.  I think this has been a chronic problem, family not forthcoming, demise benzodiazepine, likely has undiagnosed dementia as well, MRI EEG nonacute, seen by neurology here. Low-dose Seroquel daily at bedtime. Monitor with supportive care.     Had multiple detailed discussion with patient's sons, today I had discussions with patient's daughter who understands that her mom is getting old and that much of her problems are nonreversible, she also realizes that her long-term prognosis is not good. She says she is trying to prepare her kids and her family for her mom's passing away. She agrees that if she declines we should focus on comfort.   Discharge Condition: Guarded  Follow UP  Follow-up Information    Follow up with [facility].   Why:  Home Health Physical Therapy, RN   Contact information:   [address] [place] [address] [phone]       Follow up with [facility].   Specialty:  Skilled Nursing Facility   Contact information:   [address] [place] [address] [phone]      Follow up with [name], MD. Schedule an appointment as soon as possible for a visit in 1 week.    Specialty:  Family Medicine   Contact information:   [phone] [address] [place] [address] [phone]       Follow up with [facility]. Schedule an appointment as soon as possible for a visit in 1 week.   Why:  Urinary retention   Contact information:   [address] [place] [address] [phone]        Consults obtained - PCCM, Neuro.  Diet and Activity recommendation: See Discharge Instructions below  Discharge Instructions       Discharge Instructions    Discharge instructions    Complete by:  As directed   Follow with Primary MD [name], MD in 7 days   Get CBC, CMP, 2 view Chest X ray checked  by Primary MD next visit.    Activity:  As tolerated with Full fall precautions use walker/cane & assistance as needed   Disposition SNF   Diet:   Dysphagia 1 nectar thick liquids with feeding assistance and aspiration precautions.  For Heart failure patients - Check your Weight same time everyday, if you gain over 2 pounds, or you develop in leg swelling, experience more shortness of breath or chest pain, call your Primary MD immediately. Follow Cardiac Low Salt Diet and 1.5 lit/day fluid restriction.   On your next visit with your primary care physician please Get Medicines reviewed and adjusted.   Please request your Prim.MD to go over all Hospital Tests and Procedure/Radiological results at the follow up, please get all Hospital records sent to your Prim MD by signing hospital release before you go home.   If you experience worsening of your admission symptoms, develop shortness of breath, life threatening emergency, suicidal or homicidal thoughts you must seek medical attention immediately by calling 911 or calling your MD immediately  if symptoms less severe.  You Must read complete instructions/literature along with all the possible adverse reactions/side effects for all the Medicines you take and that have  been prescribed to you. Take any new Medicines after you have completely understood and accpet all the possible adverse reactions/side effects.   Do not drive, operating heavy machinery, perform activities at heights, swimming or participation in water activities or provide baby sitting services if your were admitted for syncope or siezures until you have seen by Primary MD or a Neurologist and advised to do so again.  Do not drive when taking Pain medications.    Do not take more than prescribed Pain, Sleep and Anxiety Medications  Special Instructions: If you have smoked or chewed Tobacco  in the last 2 yrs please stop smoking, stop any regular Alcohol  and or any Recreational drug use.  Wear Seat belts while driving.   Please note  You were cared for by a hospitalist during your hospital stay. If you have any questions about your discharge medications or the care you received while you were in the hospital after you are discharged, you can call the unit and asked to speak with the hospitalist on call if the hospitalist that took care of you is not available. Once you are discharged, your primary care physician will handle any further medical issues. Please note that NO REFILLS for any discharge medications will be authorized once you are discharged, as it is imperative that you return to your primary care physician (or establish a relationship with a primary care physician if you do not have one) for your aftercare needs so that they can reassess your need for medications and monitor your lab values.     Increase activity slowly    Complete by:  As directed              Discharge Medications       Medication List    STOP taking these medications        azithromycin 500 MG tablet  Commonly known as:  ZITHROMAX     cefdinir 300 MG capsule  Commonly known as:  OMNICEF     fluconazole 150 MG tablet  Commonly known as:  DIFLUCAN     meclizine 12.5 MG tablet  Commonly known  as:  ANTIVERT      TAKE these medications        albuterol (2.5 MG/3ML) 0.083% nebulizer solution  Commonly known as:  PROVENTIL  Take 3 mLs (2.5  mg total) by nebulization every 6 (six) hours as needed for wheezing or shortness of breath.     ALPRAZolam 0.5 MG tablet  Commonly known as:  XANAX  Take 0.5 tablets (0.25 mg total) by mouth 2 (two) times daily.     amLODipine 10 MG tablet  Commonly known as:  NORVASC  Take 1 tablet (10 mg total) by mouth daily.     amoxicillin-clavulanate 875-125 MG tablet  Commonly known as:  AUGMENTIN  Take 1 tablet by mouth 2 (two) times daily. For 5 more days     Aspirin 81 MG EC tablet  Take 81 mg by mouth daily.     calcium carbonate 500 MG chewable tablet  Commonly known as:  TUMS - dosed in mg elemental calcium  Chew 1 tablet by mouth daily as needed for indigestion or heartburn.     chlorpheniramine 4 MG tablet  Commonly known as:  CHLOR-TRIMETON  Take 4 mg by mouth 2 (two) times daily as needed for allergies.     levothyroxine 75 MCG tablet  Commonly known as:  SYNTHROID, LEVOTHROID  Take 0.5 tablets (37.5 mcg total) by mouth daily before breakfast.     lisinopril 10 MG tablet  Commonly known as:  PRINIVIL,ZESTRIL  Take 1 tablet (10 mg total) by mouth daily.     PATADAY 0.2 % Soln  Generic drug:  Olopatadine HCl  Place 1 drop into both eyes daily.     olopatadine 0.1 % ophthalmic solution  Commonly known as:  PATANOL  Place 1 drop into both eyes daily as needed for allergies.     pantoprazole 40 MG tablet  Commonly known as:  PROTONIX  Take 40 mg by mouth daily.     propranolol ER 60 MG 24 hr capsule  Commonly known as:  INDERAL LA  Take 1 capsule (60 mg total) by mouth daily.     QUEtiapine 25 MG tablet  Commonly known as:  SEROQUEL  Take 1 tablet (25 mg total) by mouth at bedtime.     ranitidine 300 MG tablet  Commonly known as:  ZANTAC  Take 300 mg by mouth at bedtime.     tamsulosin 0.4 MG Caps capsule    Commonly known as:  FLOMAX  Take 1 capsule (0.4 mg total) by mouth daily.     VITAMIN C PO  Take 1 tablet by mouth daily.        Major procedures and Radiology Reports - PLEASE review detailed and final reports for all details, in brief -   R.IJ C Line in ICU, discontinued on 12/12/2015    Dg Chest 2 View  11/30/2015  CLINICAL DATA:  Shortness of breath for 1 day EXAM: CHEST - 2 VIEW COMPARISON:  10/30/2015 FINDINGS: Cardiac shadow is stable. Elevation the right hemidiaphragm is again seen. Some right basilar atelectasis/ early infiltrate is noted. The left lung remains clear. No bony abnormality is noted. IMPRESSION: Right basilar changes as described. Electronically Signed   By: Alcide Clever M.D.   On: 11/30/2015 15:49   Ct Head Wo Contrast  11/30/2015  CLINICAL DATA:  Unresponsive, dizziness, treatment for UTI, history of TIA EXAM: CT HEAD WITHOUT CONTRAST TECHNIQUE: Contiguous axial images were obtained from the base of the skull through the vertex without intravenous contrast. COMPARISON:  MR brain dated 01/14/2014 FINDINGS: No evidence of parenchymal hemorrhage or extra-axial fluid collection. No mass lesion, mass effect, or midline shift. No CT evidence of acute infarction. Subcortical white matter and  periventricular small vessel ischemic changes. Intracranial atherosclerosis. The visualized paranasal sinuses are essentially clear. The mastoid air cells are unopacified. No fracture or dislocation is seen. IMPRESSION: No evidence of acute intracranial abnormality. Small vessel ischemic changes. Electronically Signed   By: Charline BillsSriyesh  Krishnan M.D.   On: 11/30/2015 14:56   Mr Brain Wo Contrast  12/05/2015  CLINICAL DATA:  82 year old hypertensive female with altered mental status. Subsequent encounter. EXAM: MRI HEAD WITHOUT CONTRAST TECHNIQUE: Multiplanar, multiecho pulse sequences of the brain and surrounding structures were obtained without intravenous contrast. COMPARISON:  11/30/2015  head CT. 01/14/2014 and 01/11/2014 brain MR. FINDINGS: No acute infarct. Artifact suspected left pons (series 4, image 16). No intracranial hemorrhage. Mild chronic small vessel disease type changes. Global atrophy without hydrocephalus. No intracranial mass lesion noted on this unenhanced exam. Minimal cerebellar tonsillar ectopia unchanged. Mild transverse ligament hypertrophy. Hyperostosis frontalis interna incidentally noted. Post lens replacement.  Mild exophthalmos. Major intracranial vascular structures are patent. Expanded partially empty sella unchanged. Minimal paranasal sinus mucosal thickening. IMPRESSION: No acute infarct detected. Remainder of findings without change as noted above. Electronically Signed   By: Lacy DuverneySteven  Olson M.D.   On: 12/05/2015 07:40   Dg Chest Port 1 View  12/13/2015  CLINICAL DATA:  82 year old female with shortness of breath EXAM: PORTABLE CHEST 1 VIEW COMPARISON:  Radiograph dated 12/12/2015 FINDINGS: There is stable appearance of the elevation of the right hemidiaphragm. A focal consolidation or subpulmonic effusion in the right lower lobe is not excluded. The left lung is clear. No pneumothorax. There is silhouetting of the right cardiac border. The osseous structures appear unremarkable. IMPRESSION: Stable elevated appearance of the right hemidiaphragm. No interval change. Electronically Signed   By: Elgie CollardArash  Radparvar M.D.   On: 12/13/2015 06:59   Dg Chest Port 1 View  12/12/2015  CLINICAL DATA:  Shortness of breath. EXAM: PORTABLE CHEST 1 VIEW COMPARISON:  12/11/2015 FINDINGS: The left IJ catheter position is unchanged. The tip is at the brachiocephalic SVC junction. The heart is enlarged but stable. Stable tortuosity and calcification of the thoracic aorta. Stable marked elevation of the right hemidiaphragm. Moderate central vascular congestion without overt pulmonary edema. No definite pleural effusions. IMPRESSION: Cardiac enlargement and moderate vascular congestion  without overt pulmonary edema. Stable marked elevation of the right hemidiaphragm. Electronically Signed   By: Rudie MeyerP.  Gallerani M.D.   On: 12/12/2015 08:11   Dg Chest Port 1 View  12/11/2015  CLINICAL DATA:  82 year old female with shortness of breath EXAM: PORTABLE CHEST 1 VIEW COMPARISON:  Prior chest x-ray 12/08/2015 FINDINGS: Left IJ approach central venous catheter in unchanged position with the tip at the confluence of the innominate vein and vena cava. Stable cardiac and mediastinal contours. Atherosclerotic calcifications again noted in the transverse aorta. Chronic elevation of the right hemidiaphragm. Bibasilar atelectasis. Mild vascular congestion without edema. No acute osseous abnormality. IMPRESSION: 1. Unchanged position of left IJ approach central venous catheter. The tip of the catheter overlies the confluence of the innominate vein and mid superior vena cava. 2. Persistent and chronic elevation of the right hemidiaphragm with bibasilar atelectasis. Electronically Signed   By: Malachy MoanHeath  McCullough M.D.   On: 12/11/2015 11:57   Dg Chest Port 1 View  12/08/2015  CLINICAL DATA:  Shortness of breath, pneumonia EXAM: PORTABLE CHEST 1 VIEW COMPARISON:  12/04/2015 FINDINGS: Cardiomegaly again noted. Left IJ central line with tip in SVC on the level of brachiocephalic vein is unchanged in position. No pneumothorax. Again noted chronic elevation of the  right hemidiaphragm. Stable chronic atelectasis or infiltrate right base. Mild degenerative changes thoracic spine. Left lung is clear. No pulmonary edema. IMPRESSION: Stable left IJ central line position. No pneumothorax. Chronic elevation of the right hemidiaphragm. Stable chronic atelectasis or infiltrate right base. No pulmonary edema Electronically Signed   By: Natasha Mead M.D.   On: 12/08/2015 11:19   Dg Chest Port 1 View  12/04/2015  CLINICAL DATA:  Encounter for central line placement. EXAM: PORTABLE CHEST 1 VIEW COMPARISON:  AP and lateral views  11/30/2015 FINDINGS: Placement of left internal jugular central venous catheter with tip in the region of the proximal SVC/ distal brachiocephalic vein. There is no pneumothorax. There is unchanged elevation of the right hemidiaphragm, slight decrease in right basilar opacity. Mild diffuse increase in left basilar opacity, vascular congestion versus multifocal atelectasis. Cardiomediastinal contours are unchanged, partially obscured. IMPRESSION: Tip of the left internal jugular central venous catheter in the region of the proximal SVC/ distal brachiocephalic vein. No pneumothorax. Mild diffuse increase in opacity throughout the left hemithorax, may reflect vascular congestion or multifocal atelectasis. Electronically Signed   By: Rubye Oaks M.D.   On: 12/04/2015 18:31   Dg Abd Portable 1v  12/11/2015  CLINICAL DATA:  Nausea.  Fatty liver disease. EXAM: PORTABLE ABDOMEN - 1 VIEW COMPARISON:  01/14/2014 FINDINGS: The bowel gas pattern is normal. No radio-opaque calculi or other significant radiographic abnormality are seen. IMPRESSION: Negative. Electronically Signed   By: Signa Kell M.D.   On: 12/11/2015 12:57    Micro Results      Recent Results (from the past 240 hour(s))  MRSA PCR Screening     Status: None   Collection Time: 12/06/15 12:24 PM  Result Value Ref Range Status   MRSA by PCR NEGATIVE NEGATIVE Final    Comment:        The GeneXpert MRSA Assay (FDA approved for NASAL specimens only), is one component of a comprehensive MRSA colonization surveillance program. It is not intended to diagnose MRSA infection nor to guide or monitor treatment for MRSA infections.   Urine culture     Status: None   Collection Time: 12/11/15 12:15 PM  Result Value Ref Range Status   Specimen Description URINE, CATHETERIZED  Final   Special Requests NONE  Final   Culture 9,000 COLONIES/mL INSIGNIFICANT GROWTH  Final   Report Status 12/12/2015 FINAL  Final  Culture, blood (routine x 2)      Status: None (Preliminary result)   Collection Time: 12/13/15  7:07 AM  Result Value Ref Range Status   Specimen Description BLOOD LEFT HAND  Final   Special Requests BOTTLES DRAWN AEROBIC AND ANAEROBIC 10CC  Final   Culture NO GROWTH 1 DAY  Final   Report Status PENDING  Incomplete  Culture, blood (routine x 2)     Status: None (Preliminary result)   Collection Time: 12/13/15  7:18 AM  Result Value Ref Range Status   Specimen Description BLOOD RIGHT HAND  Final   Special Requests IN PEDIATRIC BOTTLE 2CC  Final   Culture NO GROWTH 1 DAY  Final   Report Status PENDING  Incomplete       Today   Subjective    Tammy Fernandez today has no headache,no chest abdominal pain,no new weakness tingling or numbness, feels much better   Objective   Blood pressure 157/55, pulse 83, temperature 98 F (36.7 C), temperature source Oral, resp. rate 18, height 5\' 2"  (1.575 m), weight 83.6 kg (184 lb  4.9 oz), SpO2 97 %.   Intake/Output Summary (Last 24 hours) at 12/15/15 1111 Last data filed at 12/14/15 1726  Gross per 24 hour  Intake      0 ml  Output    400 ml  Net   -400 ml    Exam Awake but pleasantly confused, No new F.N deficits, Normal affect [place].AT,PERRAL Supple Neck,No JVD, No cervical lymphadenopathy appriciated.  Symmetrical Chest wall movement, Good air movement bilaterally, CTAB RRR,No Gallops,Rubs or new Murmurs, No Parasternal Heave +ve B.Sounds, Abd Soft, Non tender, No organomegaly appriciated, No rebound -guarding or rigidity. No Cyanosis, Clubbing or edema, No new Rash or bruise   Data Review   CBC w Diff: Lab Results  Component Value Date   WBC 12.9* 12/14/2015   HGB 8.2* 12/14/2015   HCT 26.4* 12/14/2015   PLT 289 12/14/2015   LYMPHOPCT 23 12/03/2015   MONOPCT 9 12/03/2015   EOSPCT 2 12/03/2015   BASOPCT 0 12/03/2015    CMP: Lab Results  Component Value Date   NA 140 12/14/2015   K 3.6 12/14/2015   CL 98* 12/14/2015   CO2 36* 12/14/2015   BUN 16  12/14/2015   CREATININE 0.93 12/14/2015   PROT 5.4* 12/10/2015   ALBUMIN 2.4* 12/10/2015   BILITOT 0.4 12/10/2015   ALKPHOS 59 12/10/2015   AST 34 12/10/2015   ALT 18 12/10/2015  .   Total Time in preparing paper work, data evaluation and todays exam - 35 minutes  Leroy Sea M.D on 12/15/2015 at 11:11 AM  [facility]   Office  [phone]

## 2015-12-15 NOTE — Discharge Instructions (Signed)
Follow with Primary MD [name],[name], MD in 7 days   Get CBC, CMP, 2 view Chest X ray checked  by Primary MD next visit.    Activity: As tolerated with Full fall precautions use walker/cane & assistance as needed   Disposition SNF   Diet:   Dysphagia 1 nectar thick liquids with feeding assistance and aspiration precautions.  For Heart failure patients - Check your Weight same time everyday, if you gain over 2 pounds, or you develop in leg swelling, experience more shortness of breath or chest pain, call your Primary MD immediately. Follow Cardiac Low Salt Diet and 1.5 lit/day fluid restriction.   On your next visit with your primary care physician please Get Medicines reviewed and adjusted.   Please request your Prim.MD to go over all Hospital Tests and Procedure/Radiological results at the follow up, please get all Hospital records sent to your Prim MD by signing hospital release before you go home.   If you experience worsening of your admission symptoms, develop shortness of breath, life threatening emergency, suicidal or homicidal thoughts you must seek medical attention immediately by calling 911 or calling your MD immediately  if symptoms less severe.  You Must read complete instructions/literature along with all the possible adverse reactions/side effects for all the Medicines you take and that have been prescribed to you. Take any new Medicines after you have completely understood and accpet all the possible adverse reactions/side effects.   Do not drive, operating heavy machinery, perform activities at heights, swimming or participation in water activities or provide baby sitting services if your were admitted for syncope or siezures until you have seen by Primary MD or a Neurologist and advised to do so again.  Do not drive when taking Pain medications.    Do not take more than prescribed Pain, Sleep and Anxiety Medications  Special Instructions: If you have smoked  or chewed Tobacco  in the last 2 yrs please stop smoking, stop any regular Alcohol  and or any Recreational drug use.  Wear Seat belts while driving.   Please note  You were cared for by a hospitalist during your hospital stay. If you have any questions about your discharge medications or the care you received while you were in the hospital after you are discharged, you can call the unit and asked to speak with the hospitalist on call if the hospitalist that took care of you is not available. Once you are discharged, your primary care physician will handle any further medical issues. Please note that NO REFILLS for any discharge medications will be authorized once you are discharged, as it is imperative that you return to your primary care physician (or establish a relationship with a primary care physician if you do not have one) for your aftercare needs so that they can reassess your need for medications and monitor your lab values.

## 2015-12-15 NOTE — Progress Notes (Signed)
Trans[orted via [facility]. Sondra ComeSilva, Eron Staat M, RN

## 2015-12-15 NOTE — Progress Notes (Signed)
Discharge orders received.  Report [facility] to [facility].  Awaiting [facility] for transport.   Sondra ComeSilva, Tayvion Lauder M, RN

## 2015-12-16 ENCOUNTER — Non-Acute Institutional Stay (SKILLED_NURSING_FACILITY): Payer: Medicare Other | Admitting: Internal Medicine

## 2015-12-16 ENCOUNTER — Encounter: Payer: Self-pay | Admitting: Internal Medicine

## 2015-12-16 DIAGNOSIS — K219 Gastro-esophageal reflux disease without esophagitis: Secondary | ICD-10-CM

## 2015-12-16 DIAGNOSIS — E871 Hypo-osmolality and hyponatremia: Secondary | ICD-10-CM | POA: Diagnosis not present

## 2015-12-16 DIAGNOSIS — B9689 Other specified bacterial agents as the cause of diseases classified elsewhere: Secondary | ICD-10-CM | POA: Diagnosis not present

## 2015-12-16 DIAGNOSIS — E038 Other specified hypothyroidism: Secondary | ICD-10-CM | POA: Diagnosis not present

## 2015-12-16 DIAGNOSIS — G9340 Encephalopathy, unspecified: Secondary | ICD-10-CM

## 2015-12-16 DIAGNOSIS — E034 Atrophy of thyroid (acquired): Secondary | ICD-10-CM

## 2015-12-16 DIAGNOSIS — J690 Pneumonitis due to inhalation of food and vomit: Secondary | ICD-10-CM

## 2015-12-16 DIAGNOSIS — I10 Essential (primary) hypertension: Secondary | ICD-10-CM | POA: Diagnosis not present

## 2015-12-16 DIAGNOSIS — N3090 Cystitis, unspecified without hematuria: Secondary | ICD-10-CM

## 2015-12-16 DIAGNOSIS — R410 Disorientation, unspecified: Secondary | ICD-10-CM

## 2015-12-16 DIAGNOSIS — L89153 Pressure ulcer of sacral region, stage 3: Secondary | ICD-10-CM | POA: Diagnosis not present

## 2015-12-16 DIAGNOSIS — J9621 Acute and chronic respiratory failure with hypoxia: Secondary | ICD-10-CM

## 2015-12-16 NOTE — Assessment & Plan Note (Signed)
I think this has been a chronic problem, family not forthcoming, demise benzodiazepine, likely has undiagnosed dementia as well, MRI EEG nonacute, seen by neurology here. Low-dose Seroquel daily at bedtime. Monitor with supportive care.

## 2015-12-16 NOTE — Assessment & Plan Note (Signed)
Metabolic encephalopathy due to hyponatremia along with Klebsiella UTI and now microaspiration with HCAP.   MRI brain done on 12/05/2015 nonacute, stable ammonia and B12/folte, Stable TSH and RPR. EEG non acute. Neurology was been consulted on 12/10/2015. Irving Burtonmily clearly explained that She remains high risk for aspiration. Although chest x-rays are not very impressive I think she is having microaspiration as she's developed a low-grade temperature with leukocytosis, speech following, she was placed on empiric antibiotics on 12/13/2015 for HCAP with good improvement in her leukocytosis and temperature curve. Cultures negative so far. Continue to monitor with supportive care. Cut back on her home dose benzodiazepine, monitor on supportive care.  Overall prognosis does not appear good due to ongoing encephalopathy now close to 14 days in the hospital. Explained to the family in detail. Family continues to have unreasonable expectation. SNF - cont supportive care

## 2015-12-16 NOTE — Assessment & Plan Note (Signed)
-  Due to aspiration and HCAP, much improved, continue supportive care with oxygen 2 L nasal cannula at all times along with nebulizer treatment as needed. Finish antibiotics treatment with Augmentin and 5 more days. SNF - cont augmenti 5 more days, cont chronic 2L [place] O2

## 2015-12-16 NOTE — Assessment & Plan Note (Signed)
SNF - not stated as uncontrolled;coont protonix 40 mg daily and zantac 300 mg q HS

## 2015-12-16 NOTE — Assessment & Plan Note (Signed)
SNF - stable on norvasc 10 mg, lisinopril 10 mg nad and inderal LA 60 mg daily;plan - cont current meds

## 2015-12-16 NOTE — Assessment & Plan Note (Signed)
SNF - felt to be 2/2 microaspiration;pt tx with augmentin and nebs;d/c with auf=gmenton for 5 more days adn nebs, chronic O2

## 2015-12-16 NOTE — Assessment & Plan Note (Signed)
Klebsiella UTI urinary retention -He has completed 7 days of Rocephin, since she has developed urinary retention Foley was replaced on 12/10/2015. Already on Flomax. Will have outpatient urology follow-up and will be discharged with Foley. She has remote history of hematuria.

## 2015-12-16 NOTE — Assessment & Plan Note (Signed)
Low TSH - Free T4 slightly elevated - slightly decreased dose of synthroid - SNF- will cont the new dose of 75 mcg daily and recheck TSH in 5 weeks

## 2015-12-16 NOTE — Assessment & Plan Note (Signed)
SNF - admit Na+ was 111, felt to be from poor po intake;resolved with IVF; will follow with BMP

## 2015-12-16 NOTE — Progress Notes (Signed)
MRN: [id number] Name: Tammy Fernandez  Sex: female Age: 82 y.o. DOB: 15-Sep-1924  [facility] Facility[facility]Room:100 Level Of Care: SNF Provider: Merrilee SeashoreALEXANDER, ANNE D Emergency Contacts: Extended Emergency Contact Information Primary Emergency Contact: Cooper,[name] Address: [address]          [place], [place] Home Phone: [phone] Work Phone: [phone] Relation: Daughter Secondary Emergency Contact: Osborne Omanlubodun,[name]  [place] Home Phone: [phone] Relation: Daughter  Code Status:   Allergies: Apple flavor; Peanut butter flavor; Cephalexin; Crestor; and Sulfa drugs cross reactors  Chief Complaint  Patient presents with  . New Admit To SNF    HPI: Patient is 82 y.o. female with Anxiety, Abnormal Heart Rhythms, HLD, HTN, Chronic Respiratory Failure on home2 L O2, and CVA 2015 who [facility] due to being altered and less interactive. She had been having vomiting and diarrhea intermittently since late November (home health RN apparently tested her for C.diff which was negative). She began to have tremor like movements as well and felt dizzy whenever she would ambulate. Her confusion worsened and this prompted her son to bring her to ED.   In the ED she was found to be hyponatremic with Na of 111. CT of the head was negative for acute process.Pt [facility]1/12 where she was treated for hyponatremia, acute on chronic resp failure ,2/2 HCAP, and Klebsiella UTI. The hospital course was complicated by ongoing delirium/encephalopathy that is still present.Pt is admitted to SNF for generalized weakness and ongoing delerium. While at SNF pt will be followed for HTN, tx with norvasc, lisinopril and propranolol, hypothyroidism, tx with synthroid and GERD, tx with protonix and zantac.  Past Medical History  Diagnosis Date  . Hyperlipidemia   . Hypertension   . Anxiety   . GERD (gastroesophageal reflux  disease)   . Abnormal heart rhythms   . Fatty liver   . Internal hemorrhoid     Past Surgical History  Procedure Laterality Date  . Cesarean section    . Abdominal hysterectomy    . Tee without cardioversion N/A 01/19/2014    Procedure: TRANSESOPHAGEAL ECHOCARDIOGRAM (TEE);  Surgeon: Laurey Moralealton S McLean, MD;  [facility];  Service: Cardiovascular;  Laterality: N/A;  [name]/ja      Medication List       This list is accurate as of: 12/16/15 11:59 PM.  Always use your most recent med list.               albuterol (2.5 MG/3ML) 0.083% nebulizer solution  Commonly known as:  PROVENTIL  Take 3 mLs (2.5 mg total) by nebulization every 6 (six) hours as needed for wheezing or shortness of breath.     ALPRAZolam 0.5 MG tablet  Commonly known as:  XANAX  Take 0.5 tablets (0.25 mg total) by mouth 2 (two) times daily.     amLODipine 10 MG tablet  Commonly known as:  NORVASC  Take 1 tablet (10 mg total) by mouth daily.     amoxicillin-clavulanate 875-125 MG tablet  Commonly known as:  AUGMENTIN  Take 1 tablet by mouth 2 (two) times daily. For 5 more days     Aspirin 81 MG EC tablet  Take 81 mg by mouth daily.     calcium carbonate 500 MG chewable tablet  Commonly known as:  TUMS - dosed in mg elemental calcium  Chew 1 tablet by mouth daily as needed for indigestion or heartburn.  chlorpheniramine 4 MG tablet  Commonly known as:  CHLOR-TRIMETON  Take 4 mg by mouth 2 (two) times daily as needed for allergies.     levothyroxine 75 MCG tablet  Commonly known as:  SYNTHROID, LEVOTHROID  Take 0.5 tablets (37.5 mcg total) by mouth daily before breakfast.     lisinopril 10 MG tablet  Commonly known as:  PRINIVIL,ZESTRIL  Take 1 tablet (10 mg total) by mouth daily.     PATADAY 0.2 % Soln  Generic drug:  Olopatadine HCl  Place 1 drop into both eyes daily.     olopatadine 0.1 % ophthalmic solution  Commonly known as:  PATANOL  Place 1 drop into both eyes daily as  needed for allergies.     pantoprazole 40 MG tablet  Commonly known as:  PROTONIX  Take 40 mg by mouth daily.     propranolol ER 60 MG 24 hr capsule  Commonly known as:  INDERAL LA  Take 1 capsule (60 mg total) by mouth daily.     QUEtiapine 25 MG tablet  Commonly known as:  SEROQUEL  Take 1 tablet (25 mg total) by mouth at bedtime.     ranitidine 300 MG tablet  Commonly known as:  ZANTAC  Take 300 mg by mouth at bedtime.     tamsulosin 0.4 MG Caps capsule  Commonly known as:  FLOMAX  Take 1 capsule (0.4 mg total) by mouth daily.     VITAMIN C PO  Take 1 tablet by mouth daily.        No orders of the defined types were placed in this encounter.    Immunization History  Administered Date(s) Administered  . Influenza-Unspecified 09/02/2013  . Pneumococcal-Unspecified 09/02/2013    Social History  Substance Use Topics  . Smoking status: Never Smoker   . Smokeless tobacco: Never Used  . Alcohol Use: No    Family history is + HTN, stroke    Review of Systems  DATA OBTAINED: from nurse, son, daughter; nurse reports at one time there were 20 people in the room crying and carrying on GENERAL:  no fevers, fatigue, appetite changes SKIN: No itching, rash; WN reports stage 3 sacral decubitus EYES: No eye pain, redness, discharge EARS: No earache, tinnitus, change in hearing NOSE: No congestion, drainage or bleeding  MOUTH/THROAT: No mouth or tooth pain, No sore throat RESPIRATORY: No cough, wheezing, SOB CARDIAC: No chest pain, palpitations, lower extremity edema  GI: No abdominal pain, No N/V/D or constipation, No heartburn or reflux  GU: No dysuria, frequency or urgency, or incontinence  MUSCULOSKELETAL: No unrelieved bone/joint pain NEUROLOGIC: No headache, dizziness or focal weakness PSYCHIATRIC: continued confusion  Filed Vitals:   12/18/15 2114  BP: 119/60  Pulse: 74  Temp: 97.2 F (36.2 C)  Resp: 20    SpO2 Readings from Last 1 Encounters:  12/15/15  97%        Physical Exam  GENERAL APPEARANCE: Alert,  No acute distress, pt is praying loudly during interview and at one pont was speaking in tongues.  SKIN: No diaphoresis rash; sacral wound not visualized HEAD: Normocephalic, atraumatic  EYES: Conjunctiva/lids clear. Pupils round, reactive. EOMs intact.  EARS: External exam WNL, canals clear. Hearing grossly normal.  NOSE: No deformity or discharge.  MOUTH/THROAT: Lips w/o lesions  RESPIRATORY: Breathing is even, unlabored. Lung sounds are clear   CARDIOVASCULAR: Heart RRR no murmurs, rubs or gallops. No peripheral edema.   GASTROINTESTINAL: Abdomen is soft, non-tender, not distended w/ normal bowel  sounds. GENITOURINARY: Bladder non tender, not distended  MUSCULOSKELETAL: No abnormal joints or musculature NEUROLOGIC:  Cranial nerves 2-12 grossly intact. Moves all extremities  PSYCHIATRIC: as above, pt didn't answer any questions directly but sometimes she seemed to be listening to me speaking to her family  Patient Active Problem List   Diagnosis Date Noted  . Klebsiella cystitis 12/16/2015  . Acute delirium 12/16/2015  . Aspiration pneumonia (HCC) 12/16/2015  . Leukocytosis   . Hypokalemia   . Encounter for central line placement   . Anxiety state   . Acute on chronic respiratory failure with hypoxia (HCC)   . Essential hypertension   . Physical deconditioning   . Altered mental status   . Hypothyroidism   . Chronic diarrhea   . Hyponatremia   . CAP (community acquired pneumonia) 10/29/2015  . History of CVA (cerebrovascular accident) 01/19/2014  . Dysphagia, post-stroke 01/15/2014  . Bacteremia due to Staphylococcus 01/13/2014  . Acute respiratory failure with hypoxia (HCC) 01/13/2014  . Acute encephalopathy 01/12/2014  . Left hemiplegia (HCC) 01/12/2014  . Hypertension 01/11/2014  . Acute hyponatremia 01/11/2014  . Hyperkalemia 01/11/2014  . Hematuria 01/11/2014  . E. coli UTI (urinary tract infection)  01/11/2014  . GERD (gastroesophageal reflux disease) 11/14/2011  . Obesities, morbid (HCC) 11/14/2011    CBC    Component Value Date/Time   WBC 12.9* 12/14/2015 0536   RBC 2.92* 12/14/2015 0536   HGB 8.2* 12/14/2015 0536   HCT 26.4* 12/14/2015 0536   PLT 289 12/14/2015 0536   MCV 90.4 12/14/2015 0536   LYMPHSABS 2.3 12/03/2015 0840   MONOABS 0.9 12/03/2015 0840   EOSABS 0.2 12/03/2015 0840   BASOSABS 0.0 12/03/2015 0840    CMP     Component Value Date/Time   NA 140 12/14/2015 0536   K 3.6 12/14/2015 0536   CL 98* 12/14/2015 0536   CO2 36* 12/14/2015 0536   GLUCOSE 150* 12/14/2015 0536   BUN 16 12/14/2015 0536   CREATININE 0.93 12/14/2015 0536   CALCIUM 9.4 12/14/2015 0536   PROT 5.4* 12/10/2015 0440   ALBUMIN 2.4* 12/10/2015 0440   AST 34 12/10/2015 0440   ALT 18 12/10/2015 0440   ALKPHOS 59 12/10/2015 0440   BILITOT 0.4 12/10/2015 0440   GFRNONAA 53* 12/14/2015 0536   GFRAA >60 12/14/2015 0536    Lab Results  Component Value Date   HGBA1C 7.2* 10/31/2015     Dg Chest Port 1 View  12/13/2015  CLINICAL DATA:  82 year old female with shortness of breath EXAM: PORTABLE CHEST 1 VIEW COMPARISON:  Radiograph dated 12/12/2015 FINDINGS: There is stable appearance of the elevation of the right hemidiaphragm. A focal consolidation or subpulmonic effusion in the right lower lobe is not excluded. The left lung is clear. No pneumothorax. There is silhouetting of the right cardiac border. The osseous structures appear unremarkable. IMPRESSION: Stable elevated appearance of the right hemidiaphragm. No interval change. Electronically Signed   By: Elgie Collard M.D.   On: 12/13/2015 06:59   Dg Chest Port 1 View  12/12/2015  CLINICAL DATA:  Shortness of breath. EXAM: PORTABLE CHEST 1 VIEW COMPARISON:  12/11/2015 FINDINGS: The left IJ catheter position is unchanged. The tip is at the brachiocephalic SVC junction. The heart is enlarged but stable. Stable tortuosity and calcification  of the thoracic aorta. Stable marked elevation of the right hemidiaphragm. Moderate central vascular congestion without overt pulmonary edema. No definite pleural effusions. IMPRESSION: Cardiac enlargement and moderate vascular congestion without overt pulmonary edema. Stable  marked elevation of the right hemidiaphragm. Electronically Signed   By: Rudie Meyer M.D.   On: 12/12/2015 08:11    Not all labs, radiology exams or other studies done during hospitalization come through on my EPIC note; however they are reviewed by me.    Assessment and Plan  Acute encephalopathy Metabolic encephalopathy due to hyponatremia along with Klebsiella UTI and now microaspiration with HCAP.   MRI brain done on 12/05/2015 nonacute, stable ammonia and B12/folte, Stable TSH and RPR. EEG non acute. Neurology was been consulted on 12/10/2015. Irving Burton clearly explained that She remains high risk for aspiration. Although chest x-rays are not very impressive I think she is having microaspiration as she's developed a low-grade temperature with leukocytosis, speech following, she was placed on empiric antibiotics on 12/13/2015 for HCAP with good improvement in her leukocytosis and temperature curve. Cultures negative so far. Continue to monitor with supportive care. Cut back on her home dose benzodiazepine, monitor on supportive care.  Overall prognosis does not appear good due to ongoing encephalopathy now close to 14 days in the hospital. Explained to the family in detail. Family continues to have unreasonable expectation. SNF - cont supportive care  Klebsiella cystitis Klebsiella UTI urinary retention -He has completed 7 days of Rocephin, since she has developed urinary retention Foley was replaced on 12/10/2015. Already on Flomax. Will have outpatient urology follow-up and will be discharged with Foley. She has remote history of hematuria.   Acute on chronic respiratory failure with hypoxia (HCC) -Due to aspiration  and HCAP, much improved, continue supportive care with oxygen 2 L nasal cannula at all times along with nebulizer treatment as needed. Finish antibiotics treatment with Augmentin and 5 more days. SNF - cont augmenti 5 more days, cont chronic 2L [place] O2    Acute delirium I think this has been a chronic problem, family not forthcoming, demise benzodiazepine, likely has undiagnosed dementia as well, MRI EEG nonacute, seen by neurology here. Low-dose Seroquel daily at bedtime. Monitor with supportive care.   Aspiration pneumonia (HCC) SNF - felt to be 2/2 microaspiration;pt tx with augmentin and nebs;d/c with auf=gmenton for 5 more days adn nebs, chronic O2  Hypothyroidism Low TSH - Free T4 slightly elevated - slightly decreased dose of synthroid - SNF- will cont the new dose of 75 mcg daily and recheck TSH in 5 weeks    Acute hyponatremia SNF - admit Na+ was 111, felt to be from poor po intake;resolved with IVF; will follow with BMP  Essential hypertension SNF - stable on norvasc 10 mg, lisinopril 10 mg nad and inderal LA 60 mg daily;plan - cont current meds  GERD (gastroesophageal reflux disease) SNF - not stated as uncontrolled;coont protonix 40 mg daily and zantac 300 mg q HS   Time spent > 45 min;> 50% of time with patient was spent reviewing records, labs, tests and studies, counseling and developing plan of care  Margit Hanks, MD

## 2015-12-18 ENCOUNTER — Encounter: Payer: Self-pay | Admitting: Internal Medicine

## 2015-12-18 DIAGNOSIS — L89153 Pressure ulcer of sacral region, stage 3: Secondary | ICD-10-CM | POA: Insufficient documentation

## 2015-12-18 LAB — CULTURE, BLOOD (ROUTINE X 2)
CULTURE: NO GROWTH
Culture: NO GROWTH

## 2015-12-18 NOTE — Assessment & Plan Note (Signed)
SNF - wound care nurse to follow initially

## 2015-12-22 ENCOUNTER — Non-Acute Institutional Stay (SKILLED_NURSING_FACILITY): Payer: Medicare Other | Admitting: Internal Medicine

## 2015-12-22 DIAGNOSIS — R140 Abdominal distension (gaseous): Secondary | ICD-10-CM | POA: Diagnosis not present

## 2015-12-22 DIAGNOSIS — F411 Generalized anxiety disorder: Secondary | ICD-10-CM | POA: Diagnosis not present

## 2015-12-22 DIAGNOSIS — J9621 Acute and chronic respiratory failure with hypoxia: Secondary | ICD-10-CM

## 2015-12-22 DIAGNOSIS — E871 Hypo-osmolality and hyponatremia: Secondary | ICD-10-CM | POA: Diagnosis not present

## 2015-12-22 NOTE — Progress Notes (Signed)
Patient ID: CHANTAE SOO, female   DOB: 1924-11-22, 82 y.o.   MRN: [id number] MRN: [id number] Name: Tammy Fernandez  Sex: female Age: 82 y.o. DOB: 1924/07/01  [facility] #: [facility] Facility/Room:100 Level Of Care: SNF Provider: Roena Malady Emergency Contacts: Extended Emergency Contact Information Primary Emergency Contact: Cooper,[name] Address: [address], [place] Home Phone: [phone] Work Phone: [phone] Relation: Daughter Secondary Emergency Contact: Osborne [place] Home Phone: [phone] Relation: Daughter  Code Status:   Allergies: Apple flavor; Peanut butter flavor; Cephalexin; Crestor; and Sulfa drugs cross reactors  Chief Complaint  Patient presents with  . Acute Visit   secondary to complaints of gas pain-follow-up hypernatremia-increased anxiety  HPI: Patient is 82 y.o. female with Anxiety, Abnormal Heart Rhythms, HLD, HTN, Chronic Respiratory Failure on home2 L O2, and CVA 2015 who was brought to [facility] ED 12/28 due to being altered and less interactive. She had been having vomiting and diarrhea intermittently since late November (home health RN apparently tested her for C.diff which was negative). She began to have tremor like movements as well and felt dizzy whenever she would ambulate. Her confusion worsened and this prompted her son to bring her to ED.   In the ED she was found to be hyponatremic with Na of 111. CT of the head was negative for acute process.Pt was admitted to [facility] from 12/28-1/12 where she was treated for hyponatremia, acute on chronic resp failure ,2/2 HCAP, and Klebsiella UTI. The hospital course was complicated by ongoing delirium/encephalopathy that is still present.Pthas beenadmitted to SNF for generalized weakness and ongoing delerium. While at SNF pt will b followed for HTN, tx with norvasc, lisinopril and propranolol, hypothyroidism, tx with synthroid and GERD, tx  with protonix and zantac Patient appears to be doing better clinically.  She is complaining of what she describes as gas pain and would like something for that she does not complain of abdominal pain acutely.  She is also complaining of anxiety and says she had been on Xanax more frequently at home she would like Her dose at times-feels at times her heart will race she feels this is secondary to anxiety .   Past Medical History  Diagnosis Date  . Hyperlipidemia   . Hypertension   . Anxiety   . GERD (gastroesophageal reflux disease)   . Abnormal heart rhythms   . Fatty liver   . Internal hemorrhoid     Past Surgical History  Procedure Laterality Date  . Cesarean section    . Abdominal hysterectomy    . Tee without cardioversion N/A 01/19/2014    Procedure: TRANSESOPHAGEAL ECHOCARDIOGRAM (TEE);  Surgeon: Laurey Morale, MD;  Location: [facility] ENDOSCOPY;  Service: Cardiovascular;  Laterality: N/A;  [name]      Medication List       This list is accurate as of: 12/22/15 11:59 PM.  Always use your most recent med list.               albuterol (2.5 MG/3ML) 0.083% nebulizer solution  Commonly known as:  PROVENTIL  Take 3 mLs (2.5 mg total) by nebulization every 6 (six) hours as needed for wheezing or shortness of breath.     ALPRAZolam 0.5 MG tablet  Commonly known as:  XANAX  Take 0.5 tablets (0.25 mg total) by mouth 2 (two) times daily.     amLODipine 10 MG tablet  Commonly  known as:  NORVASC  Take 1 tablet (10 mg total) by mouth daily.     Aspirin 81 MG EC tablet  Take 81 mg by mouth daily.     calcium carbonate 500 MG chewable tablet  Commonly known as:  TUMS - dosed in mg elemental calcium  Chew 1 tablet by mouth daily as needed for indigestion or heartburn.     chlorpheniramine 4 MG tablet  Commonly known as:  CHLOR-TRIMETON  Take 4 mg by mouth 2 (two) times daily as needed for allergies.     levothyroxine 75 MCG tablet  Commonly known as:  SYNTHROID,  LEVOTHROID  Take 0.5 tablets (37.5 mcg total) by mouth daily before breakfast.     lisinopril 10 MG tablet  Commonly known as:  PRINIVIL,ZESTRIL  Take 1 tablet (10 mg total) by mouth daily.     PATADAY 0.2 % Soln  Generic drug:  Olopatadine HCl  Place 1 drop into both eyes daily.     olopatadine 0.1 % ophthalmic solution  Commonly known as:  PATANOL  Place 1 drop into both eyes daily as needed for allergies.     pantoprazole 40 MG tablet  Commonly known as:  PROTONIX  Take 40 mg by mouth daily.     propranolol ER 60 MG 24 hr capsule  Commonly known as:  INDERAL LA  Take 1 capsule (60 mg total) by mouth daily.     QUEtiapine 25 MG tablet  Commonly known as:  SEROQUEL  Take 1 tablet (25 mg total) by mouth at bedtime.     ranitidine 300 MG tablet  Commonly known as:  ZANTAC  Take 300 mg by mouth at bedtime.     tamsulosin 0.4 MG Caps capsule  Commonly known as:  FLOMAX  Take 1 capsule (0.4 mg total) by mouth daily.     VITAMIN C PO  Take 1 tablet by mouth daily.          Immunization History  Administered Date(s) Administered  . Influenza-Unspecified 09/02/2013  . Pneumococcal-Unspecified 09/02/2013    Social History  Substance Use Topics  . Smoking status: Never Smoker   . Smokeless tobacco: Never Used  . Alcohol Use: No    Family history is + HTN, stroke    Review of Systems  DATA OBTAINED: from nurse, son,patient GENERAL:  no fevers, fatigue, appetite changes SKIN: No itching, rash; WN reports stage 3 sacral decubitus EYES: No eye pain, redness, discharge EARS: No earache, tinnitus, change in hearing NOSE: No congestion, drainage or bleeding  MOUTH/THROAT: No mouth or tooth pain, No sore throat RESPIRATORY: No cough, wheezing, SOB--been treated for pneumonia apparently followed chest x-ray was ordered for follow up CARDIAC: No chest pain, palpitations, lower extremity edema  GI: No abdominal pain, No N/V/D or constipation, No heartburn or reflux  does complain of gas-like pain  GU: No dysuria, frequency or urgency, or incontinence  MUSCULOSKELETAL: No unrelieved bone/joint pain NEUROLOGIC: No headache, dizziness or focal weakness PSYCHIATRIC: continued confusion--what appears this may have gotten better she does complain of anxiety  Filed Vitals:   12/22/15 2119  BP: 109/47  Pulse: 67  Temp: 98.2 F (36.8 C)  Resp: 18          Physical Exam  GENERAL APPEARANCE: Alert,  No acute distress, resting in bed comfortably-she is conversant today although apparently confused.  SKIN: No diaphoresis rash; sacral wound not visualized HEAD: Normocephalic, atraumatic  EYES: Conjunctiva/lids clear. Pupils round, reactive. EOMs intact.  EARS:  External exam WNL, canals clear. Hearing grossly normal.  NOSE: No deformity or discharge.  MOUTH/THROAT: Lips w/o lesions  RESPIRATORY: Breathing is even, unlabored. Lung sounds are clear and shallow   CARDIOVASCULAR: Heart RRR no murmurs, rubs or gallops. No peripheral edema.   GASTROINTESTINAL: Abdomen is soft, non-tender, not distended but obese w/ normal bowel sounds.  MUSCULOSKELETAL: No abnormal joints or musculature NEUROLOGIC:  Cranial nerves 2-12 grossly intact. Moves all extremities  PSYCHIATRIC: She is oriented to self was able to answer fairly simple questions  Patient Active Problem List   Diagnosis Date Noted  . Decubitus ulcer of sacral region, stage 3 (HCC) 12/18/2015  . Klebsiella cystitis 12/16/2015  . Acute delirium 12/16/2015  . Aspiration pneumonia (HCC) 12/16/2015  . Leukocytosis   . Hypokalemia   . Encounter for central line placement   . Anxiety state   . Acute on chronic respiratory failure with hypoxia (HCC)   . Essential hypertension   . Physical deconditioning   . Altered mental status   . Hypothyroidism   . Chronic diarrhea   . Hyponatremia   . CAP (community acquired pneumonia) 10/29/2015  . History of CVA (cerebrovascular accident) 01/19/2014  .  Dysphagia, post-stroke 01/15/2014  . Bacteremia due to Staphylococcus 01/13/2014  . Acute respiratory failure with hypoxia (HCC) 01/13/2014  . Acute encephalopathy 01/12/2014  . Left hemiplegia (HCC) 01/12/2014  . Hypertension 01/11/2014  . Acute hyponatremia 01/11/2014  . Hyperkalemia 01/11/2014  . Hematuria 01/11/2014  . E. coli UTI (urinary tract infection) 01/11/2014  . GERD (gastroesophageal reflux disease) 11/14/2011  . Obesities, morbid (HCC) 11/14/2011   Labs.  Generally 13 2017.  Sodium 144 potassium 3.7 BUN 17 creatinine 0.7 CO2 40.  Albumin 3.2.  WBC 8 hemoglobin 8.8 platelets 295 CBC    Component Value Date/Time   WBC 12.9* 12/14/2015 0536   RBC 2.92* 12/14/2015 0536   HGB 8.2* 12/14/2015 0536   HCT 26.4* 12/14/2015 0536   PLT 289 12/14/2015 0536   MCV 90.4 12/14/2015 0536   LYMPHSABS 2.3 12/03/2015 0840   MONOABS 0.9 12/03/2015 0840   EOSABS 0.2 12/03/2015 0840   BASOSABS 0.0 12/03/2015 0840    CMP     Component Value Date/Time   NA 140 12/14/2015 0536   K 3.6 12/14/2015 0536   CL 98* 12/14/2015 0536   CO2 36* 12/14/2015 0536   GLUCOSE 150* 12/14/2015 0536   BUN 16 12/14/2015 0536   CREATININE 0.93 12/14/2015 0536   CALCIUM 9.4 12/14/2015 0536   PROT 5.4* 12/10/2015 0440   ALBUMIN 2.4* 12/10/2015 0440   AST 34 12/10/2015 0440   ALT 18 12/10/2015 0440   ALKPHOS 59 12/10/2015 0440   BILITOT 0.4 12/10/2015 0440   GFRNONAA 53* 12/14/2015 0536   GFRAA >60 12/14/2015 0536    Lab Results  Component Value Date   HGBA1C 7.2* 10/31/2015     Dg Chest Port 1 View  12/13/2015  CLINICAL DATA:  82 year old female with shortness of breath EXAM: PORTABLE CHEST 1 VIEW COMPARISON:  Radiograph dated 12/12/2015 FINDINGS: There is stable appearance of the elevation of the right hemidiaphragm. A focal consolidation or subpulmonic effusion in the right lower lobe is not excluded. The left lung is clear. No pneumothorax. There is silhouetting of the right cardiac  border. The osseous structures appear unremarkable. IMPRESSION: Stable elevated appearance of the right hemidiaphragm. No interval change. Electronically Signed   By: Elgie Collard M.D.   On: 12/13/2015 06:59   Dg Chest Port 1  View  12/12/2015  CLINICAL DATA:  Shortness of breath. EXAM: PORTABLE CHEST 1 VIEW COMPARISON:  12/11/2015 FINDINGS: The left IJ catheter position is unchanged. The tip is at the brachiocephalic SVC junction. The heart is enlarged but stable. Stable tortuosity and calcification of the thoracic aorta. Stable marked elevation of the right hemidiaphragm. Moderate central vascular congestion without overt pulmonary edema. No definite pleural effusions. IMPRESSION: Cardiac enlargement and moderate vascular congestion without overt pulmonary edema. Stable marked elevation of the right hemidiaphragm. Electronically Signed   By: Rudie Meyer M.D.   On: 12/12/2015 08:11    Not all labs, radiology exams or other studies done during hospitalization come through on my EPIC note; however they are reviewed by me.    Assessment and Plan  #1-complaints of some abdominal discomfort she encouraged gas pain-will start simethicone 80 mg 3 times a day when necessary and monitor again abdominal exam was quite benign and do note she continues on Protonix during the day on ranitidine 300 mg daily at bedtime as well.  #2 history of hyponatremia apparently her intake is improving here clinically appears to be improving will update a lab tomorrow.  #3-history of anxiety she is on Xanax 0.25 mg twice a day Will add a when necessary dose in between as needed and monitor.  #4 history of chronic respiratory failure -apparently follow-up chest x-ray was ordered and has been completed we will try to obtain those results -clinically she appears to be doing well in this regard  [id number]      Jonnatan Hanners C,

## 2015-12-27 ENCOUNTER — Encounter (HOSPITAL_COMMUNITY): Payer: Self-pay | Admitting: Emergency Medicine

## 2015-12-27 ENCOUNTER — Emergency Department (HOSPITAL_COMMUNITY)
Admission: EM | Admit: 2015-12-27 | Discharge: 2015-12-27 | Disposition: A | Discharge status: Home / Self Care | Payer: Medicare Other | Attending: Emergency Medicine | Admitting: Emergency Medicine

## 2015-12-27 DIAGNOSIS — F419 Anxiety disorder, unspecified: Secondary | ICD-10-CM | POA: Diagnosis not present

## 2015-12-27 DIAGNOSIS — K5641 Fecal impaction: Secondary | ICD-10-CM | POA: Insufficient documentation

## 2015-12-27 DIAGNOSIS — K219 Gastro-esophageal reflux disease without esophagitis: Secondary | ICD-10-CM | POA: Insufficient documentation

## 2015-12-27 DIAGNOSIS — Z79899 Other long term (current) drug therapy: Secondary | ICD-10-CM | POA: Insufficient documentation

## 2015-12-27 DIAGNOSIS — Z90710 Acquired absence of both cervix and uterus: Secondary | ICD-10-CM | POA: Insufficient documentation

## 2015-12-27 DIAGNOSIS — I10 Essential (primary) hypertension: Secondary | ICD-10-CM | POA: Insufficient documentation

## 2015-12-27 DIAGNOSIS — Z8639 Personal history of other endocrine, nutritional and metabolic disease: Secondary | ICD-10-CM | POA: Insufficient documentation

## 2015-12-27 DIAGNOSIS — Z7982 Long term (current) use of aspirin: Secondary | ICD-10-CM | POA: Diagnosis not present

## 2015-12-27 DIAGNOSIS — R109 Unspecified abdominal pain: Secondary | ICD-10-CM | POA: Diagnosis present

## 2015-12-27 LAB — COMPREHENSIVE METABOLIC PANEL
ALBUMIN: 3.7 g/dL (ref 3.5–5.0)
ALK PHOS: 157 U/L — AB (ref 38–126)
ALT: 18 U/L (ref 14–54)
ANION GAP: 9 (ref 5–15)
AST: 23 U/L (ref 15–41)
BUN: 18 mg/dL (ref 6–20)
CALCIUM: 9.7 mg/dL (ref 8.9–10.3)
CHLORIDE: 99 mmol/L — AB (ref 101–111)
CO2: 35 mmol/L — AB (ref 22–32)
Creatinine, Ser: 0.69 mg/dL (ref 0.44–1.00)
GFR calc non Af Amer: >60 mL/min (ref >60)
GLUCOSE: 123 mg/dL — AB (ref 65–99)
Potassium: 4.4 mmol/L (ref 3.5–5.1)
SODIUM: 143 mmol/L (ref 135–145)
Total Bilirubin: 0.3 mg/dL (ref 0.3–1.2)
Total Protein: 7.3 g/dL (ref 6.5–8.1)

## 2015-12-27 LAB — CBC
HEMATOCRIT: 34.8 % — AB (ref 36.0–46.0)
HEMOGLOBIN: 10.2 g/dL — AB (ref 12.0–15.0)
MCH: 27.9 pg (ref 26.0–34.0)
MCHC: 29.3 g/dL — AB (ref 30.0–36.0)
MCV: 95.1 fL (ref 78.0–100.0)
Platelets: 312 10*3/uL (ref 150–400)
RBC: 3.66 MIL/uL — AB (ref 3.87–5.11)
RDW: 17.4 % — ABNORMAL HIGH (ref 11.5–15.5)
WBC: 6 10*3/uL (ref 4.0–10.5)

## 2015-12-27 LAB — LIPASE, BLOOD: LIPASE: 19 U/L (ref 11–51)

## 2015-12-27 NOTE — ED Notes (Signed)
Pt urinated in bed, pt and bed cleaned.  

## 2015-12-27 NOTE — ED Notes (Signed)
MD at bedside for disimpaction

## 2015-12-27 NOTE — ED Notes (Signed)
Pt states she's currently in rehab from a previous hospitalization. Now having constipation since almost two weeks prior. Went to urology today and had an indwelling catheter removed to see if patient was able to urinate on her own. Was sent over here without catheter because Urologist believes once the patient gets disimpacted that she will be able to urinate on her own. Patient c/o abdominal pain.

## 2015-12-27 NOTE — Discharge Instructions (Signed)
Put 8 scoops of MiraLAX in 32 ounces of drink of your choice. Use a fleets enema. Stay near a bathroom.  Fecal Impaction A fecal impaction happens when there is a large, firm amount of stool (or feces) that cannot be passed. The impacted stool is usually in the rectum, which is the lowest part of the large bowel. The impacted stool can block the colon and cause significant problems. CAUSES  The longer stool stays in the rectum, the harder it gets. Anything that slows down your bowel movements can lead to fecal impaction, such as:  Constipation. This can be a long-standing (chronic) problem or can happen suddenly (acute).  Painful conditions of the rectum, such as hemorrhoids or anal fissures. The pain of these conditions can make you try to avoid having bowel movements.  Narcotic pain-relieving medicines, such as methadone, morphine, or codeine.  Not drinking enough fluids.  Inactivity and bed rest over long periods of time.  Diseases of the brain or nervous system that damage the nerves controlling the muscles of the intestines. SIGNS AND SYMPTOMS   Lack of normal bowel movements or changes in bowel patterns.  Sense of fullness in the rectum but unable to pass stool.  Pain or cramps in the abdominal area (often after meals).  Thin, watery discharge from the rectum. DIAGNOSIS  Your health care provider may suspect that you have a fecal impaction based on your symptoms and a physical exam. This will include an exam of your rectum. Sometimes X-rays or lab testing may be needed to confirm the diagnosis and to be sure there are no other problems.  TREATMENT   Initially an impaction can be removed manually. Using a gloved finger, your health care provider can remove hard stool from your rectum.  Medicine is sometimes needed. A suppository or enema can be given in the rectum to soften the stool, which can stimulate a bowel movement. Medicines can also be given by mouth (orally).  Though  rare, surgery may be needed if the colon has torn (perforated) due to blockage. HOME CARE INSTRUCTIONS   Develop regular bowel habits. This could include getting in the habit of having a bowel movement after your morning cup of coffee or after eating. Be sure to allow yourself enough time on the toilet.  Maintain a high-fiber diet.  Drink enough fluids to keep your urine clear or pale yellow as directed by your health care provider.  Exercise regularly.  If you begin to get constipated, increase the amount of fiber in your diet. Eat plenty of fruits, vegetables, whole wheat breads, bran, oatmeal, and similar products.  Take natural fiber laxatives or other laxatives only as directed by your health care provider. SEEK MEDICAL CARE IF:   You have ongoing rectal pain.  You require enemas or suppositories more than twice a week.  You have rectal bleeding.  You have continued problems, or you develop abdominal pain.  You have thin, pencil-like stools. SEEK IMMEDIATE MEDICAL CARE IF:  You have black or tarry stools. MAKE SURE YOU:   Understand these instructions.  Will watch your condition.  Will get help right away if you are not doing well or get worse.   This information is not intended to replace advice given to you by your health care provider. Make sure you discuss any questions you have with your health care provider.   Document Released: 08/11/2004 Document Revised: 09/09/2013 Document Reviewed: 05/26/2013 Elsevier Interactive Patient Education [employer]

## 2015-12-27 NOTE — ED Notes (Signed)
Pt provided with water

## 2015-12-27 NOTE — ED Notes (Signed)
Patient was alert, oriented and stable upon discharge. RN went over AVS and patient had no further questions.  

## 2015-12-27 NOTE — ED Notes (Addendum)
[facility] called for transport.  

## 2015-12-27 NOTE — ED Provider Notes (Signed)
CSN: [id number]     Arrival date & time 12/27/15  1153 History   First MD Initiated Contact with Patient 12/27/15 1629     Chief Complaint  Patient presents with  . Abdominal Pain  . Constipation     (Consider location/radiation/quality/duration/timing/severity/associated sxs/prior Treatment) Patient is a 82 y.o. female presenting with constipation. The history is provided by the patient.  Constipation Severity:  Moderate Time since last bowel movement:  2 weeks Timing:  Constant Progression:  Worsening Chronicity:  New Stool description:  None produced Relieved by:  Nothing Worsened by:  Nothing tried Ineffective treatments:  None tried Associated symptoms: urinary retention   Associated symptoms: no diarrhea, no dysuria, no fever, no nausea and no vomiting     82 yo F with a history of chronic constipation comes in with a chief complaint of constipation for 2 weeks. Patient was recently in the hospital for severe electrolyte abnormalities. Patient states she did not get a good bowel regimen will she was there. Has not pooped since then. Has been in a rehabilitation facility with a Foley catheter she was unable to urinate. Have that removed today however was still unable to PDN after the history was obtained that she had not pooped in 2 weeks the center to the ED for evaluation. Patient states she is able to pass flatus without difficulty. Has some nausea but denies vomiting. Mild crampy abdominal pain.  Past Medical History  Diagnosis Date  . Hyperlipidemia   . Hypertension   . Anxiety   . GERD (gastroesophageal reflux disease)   . Abnormal heart rhythms   . Fatty liver   . Internal hemorrhoid    Past Surgical History  Procedure Laterality Date  . Cesarean section    . Abdominal hysterectomy    . Tee without cardioversion N/A 01/19/2014    Procedure: TRANSESOPHAGEAL ECHOCARDIOGRAM (TEE);  Surgeon: Laurey Morale, MD;  Location: [facility] ENDOSCOPY;  Service: Cardiovascular;   Laterality: N/A;  [name]   Family History  Problem Relation Age of Onset  . Stroke Father   . Hypertension Father   . Colon cancer Cousin   . Colon cancer      Aunt  . Diabetes      aunt and cousins  . Heart disease      cousins   Social History  Substance Use Topics  . Smoking status: Never Smoker   . Smokeless tobacco: Never Used  . Alcohol Use: No   OB History    No data available     Review of Systems  Constitutional: Negative for fever and chills.  HENT: Negative for congestion and rhinorrhea.   Eyes: Negative for redness and visual disturbance.  Respiratory: Negative for shortness of breath and wheezing.   Cardiovascular: Negative for chest pain and palpitations.  Gastrointestinal: Positive for constipation. Negative for nausea, vomiting and diarrhea.  Genitourinary: Negative for dysuria and urgency.  Musculoskeletal: Negative for myalgias and arthralgias.  Skin: Negative for pallor and wound.  Neurological: Negative for dizziness and headaches.      Allergies  Apple flavor; Peanut butter flavor; Cephalexin; Crestor; and Sulfa drugs cross reactors  Home Medications   Prior to Admission medications   Medication Sig Start Date End Date Taking? Authorizing Provider  ALPRAZolam Prudy Feeler) 0.5 MG tablet Take 0.5 tablets (0.25 mg total) by mouth 2 (two) times daily. 12/15/15  Yes Leroy Sea, MD  amLODipine (NORVASC) 10 MG tablet Take 1 tablet (10 mg total) by mouth daily. 12/15/15  Yes Leroy Sea, MD  Ascorbic Acid (VITAMIN C PO) Take 1 tablet by mouth daily.   Yes Historical Provider, MD  Aspirin 81 MG EC tablet Take 81 mg by mouth daily.   Yes Historical Provider, MD  chlorpheniramine (CHLOR-TRIMETON) 4 MG tablet Take 4 mg by mouth 2 (two) times daily as needed for allergies.   Yes Historical Provider, MD  levothyroxine (SYNTHROID, LEVOTHROID) 75 MCG tablet Take 0.5 tablets (37.5 mcg total) by mouth daily before breakfast. 12/15/15  Yes Leroy Sea,  MD  lisinopril (PRINIVIL,ZESTRIL) 10 MG tablet Take 1 tablet (10 mg total) by mouth daily. 12/15/15  Yes Leroy Sea, MD  olopatadine (PATANOL) 0.1 % ophthalmic solution Place 1 drop into both eyes daily as needed for allergies.  10/12/15  Yes Historical Provider, MD  pantoprazole (PROTONIX) 40 MG tablet Take 40 mg by mouth daily. 11/10/15  Yes Historical Provider, MD  PATADAY 0.2 % SOLN Place 1 drop into both eyes daily. 10/09/15  Yes Historical Provider, MD  propranolol ER (INDERAL LA) 60 MG 24 hr capsule Take 1 capsule (60 mg total) by mouth daily. 01/29/14  Yes Daniel J Angiulli, PA-C  QUEtiapine (SEROQUEL) 25 MG tablet Take 1 tablet (25 mg total) by mouth at bedtime. 12/15/15  Yes Leroy Sea, MD  ranitidine (ZANTAC) 300 MG tablet Take 300 mg by mouth at bedtime. 10/17/15  Yes Historical Provider, MD  tamsulosin (FLOMAX) 0.4 MG CAPS capsule Take 1 capsule (0.4 mg total) by mouth daily. 12/15/15  Yes Leroy Sea, MD  albuterol (PROVENTIL) (2.5 MG/3ML) 0.083% nebulizer solution Take 3 mLs (2.5 mg total) by nebulization every 6 (six) hours as needed for wheezing or shortness of breath. 11/01/15   Catarina Hartshorn, MD  calcium carbonate (TUMS - DOSED IN MG ELEMENTAL CALCIUM) 500 MG chewable tablet Chew 1 tablet by mouth daily as needed for indigestion or heartburn.    Historical Provider, MD   BP 140/60 mmHg  Pulse 82  Temp(Src) 98.2 F (36.8 C) (Oral)  Resp 20  SpO2 96% Physical Exam  Constitutional: She is oriented to person, place, and time. She appears well-developed and well-nourished. No distress.  HENT:  Head: Normocephalic and atraumatic.  Eyes: EOM are normal. Pupils are equal, round, and reactive to light.  Neck: Normal range of motion. Neck supple.  Cardiovascular: Normal rate and regular rhythm.  Exam reveals no gallop and no friction rub.   No murmur heard. Pulmonary/Chest: Effort normal. She has no wheezes. She has no rales.  Abdominal: Soft. She exhibits no distension.  There is no tenderness. There is no rebound and no guarding.  Genitourinary:  Copious amounts of soft stool in the vault.  Musculoskeletal: She exhibits no edema or tenderness.  Neurological: She is alert and oriented to person, place, and time.  Skin: Skin is warm and dry. She is not diaphoretic.  Psychiatric: She has a normal mood and affect. Her behavior is normal.  Nursing note and vitals reviewed.   ED Course  Fecal disimpaction Date/Time: 12/27/2015 4:53 PM Performed by: Adela Lank Jazmon Kos Authorized by: Melene Plan Consent: Verbal consent obtained. Risks and benefits: risks, benefits and alternatives were discussed Consent given by: patient Required items: required blood products, implants, devices, and special equipment available Patient identity confirmed: verbally with patient Time out: Immediately prior to procedure a "time out" was called to verify the correct patient, procedure, equipment, support staff and site/side marked as required. Local anesthesia used: no Patient sedated: no Patient tolerance: Patient  tolerated the procedure well with no immediate complications Comments: Large amount of stool removed from the rectum   (including critical care time) Labs Review Labs Reviewed  COMPREHENSIVE METABOLIC PANEL - Abnormal; Notable for the following:    Chloride 99 (*)    CO2 35 (*)    Glucose, Bld 123 (*)    Alkaline Phosphatase 157 (*)    All other components within normal limits  CBC - Abnormal; Notable for the following:    RBC 3.66 (*)    Hemoglobin 10.2 (*)    HCT 34.8 (*)    MCHC 29.3 (*)    RDW 17.4 (*)    All other components within normal limits  LIPASE, BLOOD    Imaging Review No results found. I have personally reviewed and evaluated these images and lab results as part of my medical decision-making.   EKG Interpretation None      MDM   Final diagnoses:  Fecal impaction in rectum [facility])    82 yo F with a chief complaint of constipation. Patient  had a large amount of stool in the rectum that was removed digitally. Patient afterwards having significant improvement of her symptoms. We'll allow the patient to try and urinate. If unable will likely need to replace Foley. Patient needs a good bowel cleanout at home.  Able to urinate, d/c home.   5:50 PM:  I have discussed the diagnosis/risks/treatment options with the patient and family and believe the pt to be eligible for discharge home to follow-up with PCP. We also discussed returning to the ED immediately if new or worsening sx occur. We discussed the sx which are most concerning (e.g., sudden worsening pain, fever, inability to tolerate by mouth) that necessitate immediate return. Medications administered to the patient during their visit and any new prescriptions provided to the patient are listed below.  Medications given during this visit Medications - No data to display  New Prescriptions   No medications on file    The patient appears reasonably screen and/or stabilized for discharge and I doubt any other medical condition or other [facility] requiring further screening, evaluation, or treatment in the ED at this time prior to discharge.    Melene Plan, DO 12/27/15 1750

## 2015-12-29 ENCOUNTER — Non-Acute Institutional Stay (SKILLED_NURSING_FACILITY): Payer: Medicare Other | Admitting: Internal Medicine

## 2015-12-29 ENCOUNTER — Encounter: Payer: Self-pay | Admitting: Internal Medicine

## 2015-12-29 DIAGNOSIS — E871 Hypo-osmolality and hyponatremia: Secondary | ICD-10-CM | POA: Diagnosis not present

## 2015-12-29 DIAGNOSIS — J9621 Acute and chronic respiratory failure with hypoxia: Secondary | ICD-10-CM | POA: Diagnosis not present

## 2015-12-29 DIAGNOSIS — I10 Essential (primary) hypertension: Secondary | ICD-10-CM

## 2015-12-29 DIAGNOSIS — R401 Stupor: Secondary | ICD-10-CM | POA: Diagnosis not present

## 2015-12-29 DIAGNOSIS — E038 Other specified hypothyroidism: Secondary | ICD-10-CM | POA: Diagnosis not present

## 2015-12-30 ENCOUNTER — Other Ambulatory Visit: Payer: Self-pay | Admitting: Internal Medicine

## 2016-01-02 ENCOUNTER — Encounter: Payer: Self-pay | Admitting: Internal Medicine

## 2016-01-02 NOTE — Progress Notes (Signed)
This encounter was created in error - please disregard.

## 2016-01-02 NOTE — Progress Notes (Signed)
Patient ID: MYCHAELA LENNARTZ, female   DOB: 28-Sep-1924, 82 y.o.   MRN: [id number] MRN: [id number] Name: SHAKESHA SOLTAU  Sex: female Age: 82 y.o. DOB: 01/24/1924  [place] #: [facility] Facility/Room:100 Level Of Care: SNF Provider: Wille Celeste Emergency Contacts: Extended Emergency Contact Information Primary Emergency Contact: Cooper,[name] Address: [address] [place], [name] Phone: [phone] Work Phone: [phone] Relation: Daughter Secondary Emergency Contact: [place] Phone: [phone] Mobile Phone: [phone] Relation: Daughter  Code Status:   Allergies: Apple flavor; Peanut butter flavor; Cephalexin; Crestor; and Sulfa drugs cross reactors  Chief complaint-discharge note  HPI: Patient is 82 y.o. female with Anxiety, Abnormal Heart Rhythms, HLD, HTN, Chronic Respiratory Failure on home2 L O2, and CVA 2015 who was brought to [facility] ED 12/28 due to being altered and less interactive. She had been having vomiting and diarrhea intermittently since late November (home health RN apparently tested her for C.diff which was negative). She began to have tremor like movements as well and felt dizzy whenever she would ambulate. Her confusion worsened and this prompted her son to bring her to ED.   In the ED she was found to be hyponatremic with Na of 111. CT of the head was negative for acute process.Pt was admitted to [facility] from 12/28-1/12 where she was treated for hyponatremia, acute on chronic resp failure ,2/2 HCAP, and Klebsiella UTI. The hospital course was complicated by ongoing delirium/encephalopathy that is still present.Pt is admitted to SNF for generalized weakness and ongoing delerium. While at SNF pt  followed for HTN, tx with norvasc, lisinopril and propranolol, hypothyroidism, tx with synthroid and GERD, tx with protonix and zantac Actually patient has been quite stable during her stay here she  has gained strength electrolytes appear to have remained stable as well-he did go to the ER apparently on 1-24 complaining constipation was manually disimpacted she is not complaining of constipation today her labs looked stable in the ER as well with a sodium of 143 potassium 4.4 BUN 18 creatinine 0.69 hemoglobin showed improvement at 10.2 white count was 6.0.  Currently she is resting in bed comfortably she will be going home with family who is very supportive they are in the room with her-she would benefit from continued PT and OT as well as a CNA support secondary to her continued weakness in relatively immobility.  Currently she has no acute complaints she is looking forward to going home.  She did have urinary retention at one point Foley has been removed according to nursing she is voiding.  .  Past Medical History  Diagnosis Date  . Hyperlipidemia   . Hypertension   . Anxiety   . GERD (gastroesophageal reflux disease)   . Abnormal heart rhythms   . Fatty liver   . Internal hemorrhoid     Past Surgical History  Procedure Laterality Date  . Cesarean section    . Abdominal hysterectomy    . Tee without cardioversion N/A 01/19/2014    Procedure: TRANSESOPHAGEAL ECHOCARDIOGRAM (TEE);  Surgeon: Larey Dresser, MD;  Location: [place];  Service: Cardiovascular;  Laterality: N/A;  [name]      Medication List       This list is accurate as of: 12/29/15 11:59 PM.  Always use your most recent med list.               albuterol (2.5 MG/3ML)  0.083% nebulizer solution  Commonly known as:  PROVENTIL  Take 3 mLs (2.5 mg total) by nebulization every 6 (six) hours as needed for wheezing or shortness of breath.     ALPRAZolam 0.5 MG tablet  Commonly known as:  XANAX  Take 0.5 tablets (0.25 mg total) by mouth 2 (two) times daily.     amLODipine 10 MG tablet  Commonly known as:  NORVASC  Take 1 tablet (10 mg total) by mouth daily.     Aspirin 81 MG EC tablet  Take 81 mg by  mouth daily.     calcium carbonate 500 MG chewable tablet  Commonly known as:  TUMS - dosed in mg elemental calcium  Chew 1 tablet by mouth daily as needed for indigestion or heartburn.     chlorpheniramine 4 MG tablet  Commonly known as:  CHLOR-TRIMETON  Take 4 mg by mouth 2 (two) times daily as needed for allergies. Reported on 01/02/2016     levothyroxine 75 MCG tablet  Commonly known as:  SYNTHROID, LEVOTHROID  Take 0.5 tablets (37.5 mcg total) by mouth daily before breakfast.     lisinopril 10 MG tablet  Commonly known as:  PRINIVIL,ZESTRIL  Take 1 tablet (10 mg total) by mouth daily.     pantoprazole 40 MG tablet  Commonly known as:  PROTONIX  Take 40 mg by mouth daily.     PATADAY 0.2 % Soln  Generic drug:  Olopatadine HCl  Place 1 drop into both eyes daily.     propranolol ER 60 MG 24 hr capsule  Commonly known as:  INDERAL LA  Take 1 capsule (60 mg total) by mouth daily.     QUEtiapine 25 MG tablet  Commonly known as:  SEROQUEL  Take 1 tablet (25 mg total) by mouth at bedtime.     ranitidine 300 MG tablet  Commonly known as:  ZANTAC  Take 300 mg by mouth at bedtime.     tamsulosin 0.4 MG Caps capsule  Commonly known as:  FLOMAX  Take 1 capsule (0.4 mg total) by mouth daily.     VITAMIN C PO  Take 1 tablet by mouth daily.          Immunization History  Administered Date(s) Administered  . Influenza-Unspecified 09/02/2013  . Pneumococcal-Unspecified 09/02/2013    Social History  Substance Use Topics  . Smoking status: Never Smoker   . Smokeless tobacco: Never Used  . Alcohol Use: No    Family history is + HTN, stroke    Review of Systems  DATA OBTAINED: from nurse, son, daughter;  GENERAL:  no fevers, fatigue, appetite changes SKIN: No itching, rash;  EYES: No eye pain, redness, discharge EARS: No earache, tinnitus, change in hearing NOSE: No congestion, drainage or bleeding  MOUTH/THROAT: No mouth or tooth pain, No sore  throat RESPIRATORY: No cough, wheezing, SOB CARDIAC: No chest pain, palpitations, lower extremity edema  GI: No abdominal pain, No N/V/D has had constipation, No heartburn or reflux  GU: No dysuria, frequency or urgency, or incontinence  MUSCULOSKELETAL: No unrelieved bone/joint pain continues with some weakness and debility NEUROLOGIC: No headache, dizziness or focal weakness PSYCHIATRIC: continued confusion--but appears more alert interactive than her admission presentation Temperature 98.4 pulse 70 respirations 20 blood pressure 128/61         Physical Exam Temperature 98.4 pulse 70 respirations 20 blood pressure 128/61  GENERAL APPEARANCE: Alert,  No acute distress, resting in bed comfortably she is alert and interactive.  SKIN:  No diaphoresis rash; sacral wound not visualized--has been followed by wound care HEAD: Normocephalic, atraumatic  EYES: Conjunctiva/lids clear. Pupils round, reactive. EOMs intact.  EARS: External exam WNL, canals clear. Hearing grossly normal.  NOSE: No deformity or discharge.  MOUTH/THROAT: Lips w/o lesions somewhat shallow RESPIRATORY: Breathing is even, unlabored. Lung sounds are clear   CARDIOVASCULAR: Heart RRR with occasional irregular beats no murmurs, rubs or gallops. No peripheral edema.   GASTROINTESTINAL: Abdomen is soft, non-tender, not distended w/ normal bowel sounds. GENITOURINARY: Bladder non tender, not distended  MUSCULOSKELETAL: No abnormal joints or musculature strength is strong bilaterally largely ambulates in a wheelchair-does ambulate to the bathroom with assistance from nursing and family NEUROLOGIC:  Cranial nerves 2-12 grossly intact. Moves all extremities  PSYCHIATRIC:  She is oriented to self --can carry on a conversation although there is some confusion is bright and interactive  Patient Active Problem List   Diagnosis Date Noted  . Decubitus ulcer of sacral region, stage 3 ([place]) 12/18/2015  . Klebsiella cystitis  12/16/2015  . Acute delirium 12/16/2015  . Aspiration pneumonia ([place]) 12/16/2015  . Leukocytosis   . Hypokalemia   . Encounter for central line placement   . Anxiety state   . Acute on chronic respiratory failure with hypoxia ([place])   . Essential hypertension   . Physical deconditioning   . Altered mental status   . Hypothyroidism   . Chronic diarrhea   . Hyponatremia   . CAP (community acquired pneumonia) 10/29/2015  . History of CVA (cerebrovascular accident) 01/19/2014  . Dysphagia, post-stroke 01/15/2014  . Bacteremia due to Staphylococcus 01/13/2014  . Acute respiratory failure with hypoxia ([place]) 01/13/2014  . Acute encephalopathy 01/12/2014  . Left hemiplegia ([place]) 01/12/2014  . Hypertension 01/11/2014  . Acute hyponatremia 01/11/2014  . Hyperkalemia 01/11/2014  . Hematuria 01/11/2014  . E. coli UTI (urinary tract infection) 01/11/2014  . GERD (gastroesophageal reflux disease) 11/14/2011  . Obesities, morbid ([place]) 11/14/2011   labs.  General 24th 2017.  Sodium 143 potassium 4.4 BUN 18 creatinine 0.69.  Alk phosphatase 157 otherwise liver function tests within normal limits.  WBC 6.0 hemoglobin 10.2 platelets 312  CBC    Component Value Date/Time   WBC 6.0 12/27/2015 1358   RBC 3.66* 12/27/2015 1358   HGB 10.2* 12/27/2015 1358   HCT 34.8* 12/27/2015 1358   PLT 312 12/27/2015 1358   MCV 95.1 12/27/2015 1358   LYMPHSABS 2.3 12/03/2015 0840   MONOABS 0.9 12/03/2015 0840   EOSABS 0.2 12/03/2015 0840   BASOSABS 0.0 12/03/2015 0840    CMP     Component Value Date/Time   NA 143 12/27/2015 1358   K 4.4 12/27/2015 1358   CL 99* 12/27/2015 1358   CO2 35* 12/27/2015 1358   GLUCOSE 123* 12/27/2015 1358   BUN 18 12/27/2015 1358   CREATININE 0.69 12/27/2015 1358   CALCIUM 9.7 12/27/2015 1358   PROT 7.3 12/27/2015 1358   ALBUMIN 3.7 12/27/2015 1358   AST 23 12/27/2015 1358   ALT 18 12/27/2015 1358   ALKPHOS 157* 12/27/2015 1358   BILITOT 0.3 12/27/2015 1358    GFRNONAA >60 12/27/2015 1358   GFRAA >60 12/27/2015 1358    Lab Results  Component Value Date   HGBA1C 7.2* 10/31/2015     Dg Chest Port 1 View  12/13/2015  CLINICAL DATA:  82 year old female with shortness of breath EXAM: PORTABLE CHEST 1 VIEW COMPARISON:  Radiograph dated 12/12/2015 FINDINGS: There is stable appearance of the elevation of the  right hemidiaphragm. A focal consolidation or subpulmonic effusion in the right lower lobe is not excluded. The left lung is clear. No pneumothorax. There is silhouetting of the right cardiac border. The osseous structures appear unremarkable. IMPRESSION: Stable elevated appearance of the right hemidiaphragm. No interval change. Electronically Signed   By: Anner Crete M.D.   On: 12/13/2015 06:59   Dg Chest Port 1 View  12/12/2015  CLINICAL DATA:  Shortness of breath. EXAM: PORTABLE CHEST 1 VIEW COMPARISON:  12/11/2015 FINDINGS: The left IJ catheter position is unchanged. The tip is at the brachiocephalic SVC junction. The heart is enlarged but stable. Stable tortuosity and calcification of the thoracic aorta. Stable marked elevation of the right hemidiaphragm. Moderate central vascular congestion without overt pulmonary edema. No definite pleural effusions. IMPRESSION: Cardiac enlargement and moderate vascular congestion without overt pulmonary edema. Stable marked elevation of the right hemidiaphragm. Electronically Signed   By: Marijo Sanes M.D.   On: 12/12/2015 08:11    Not all labs, radiology exams or other studies done during hospitalization come through on my EPIC note; however they are reviewed by me.      Assessment and Plan  Acute encephalopathy-thought secondary to hyponatremia along with a UTI in the hospital microaspiration with healthcare associated pneumonia-this appears to have improved-we have increased her Xanax slightly secondary to concerns of breakthrough anxiety by patient- appears to have tolerated this however relatively  well will need follow-up by primary care provider but she appears to have  improved here during her stay.  History of urinary retention she is on Flomax Foley catheter is been removed and she apparently is voiding well according to nursing staff follow-up as needed.  History of acute on chronic respiratory failure with hypoxia-this was thought secondary to aspiration and pneumonia-this appears to be stable during her stay here her hand has really not really been in issue.  She has completed her antibiotic.  Continues on nebulizers as needed.  Marland Kitchen  History of acute delirium again this appears to have improved of note she still is on low-dose Seroquel at bedtime.  History hypothyroidism she did have a low TSH in the hospital her Synthroid was decreased in the hospital-she will need her TSH updated by primary care provider.  History of acute hyponatremia this appears to have resolved thought to be secondary to poor by mouth intake-obviously this will have to be encouraged at home as well to continue her increased by mouth intake sodium was 143 on lab done on January 24.  History of hypertension this appears stable on Norvasc 10 mg a day listening 10 mg a day and Inderal LA 60 mg daily recent blood pressures 128/61-135/74-the lowest one I see is 102/40 but this does not appear to be consistent.  History of GERD-she is on Protonix and Zantac of note she has been started simethicone as needed for gas discomfort.  History of constipation-continues with when necessary meds including milk of magnesia--this will have to be monitored closely as an outpatient-  Again patient will be going home with family is very supportive she will need expedient primary care follow-up-as well as continued PT and OT for further strengthening with her history of debility as well as nursing support and CNA support to help with activities of daily living and follow-up of her multiple medical issues.  Also home health to  draw a CBC a metabolic panel tomorrow and notify primary care provider of results.  [id number] note greater than 30 minutes spent on this  discharge summary-greater than 50% of time spent coordinating plan of care for numerous diagnoses        Stephine Langbehn C,

## 2016-02-16 IMAGING — XA IR FLUORO GUIDE CV LINE*R*
1 series · 2 of 2 positions shown · non-contrast
Comparison: none

CLINICAL DATA: Sepsis

[Series 1: run · 2 of 2 slices shown]
[im 1/2]
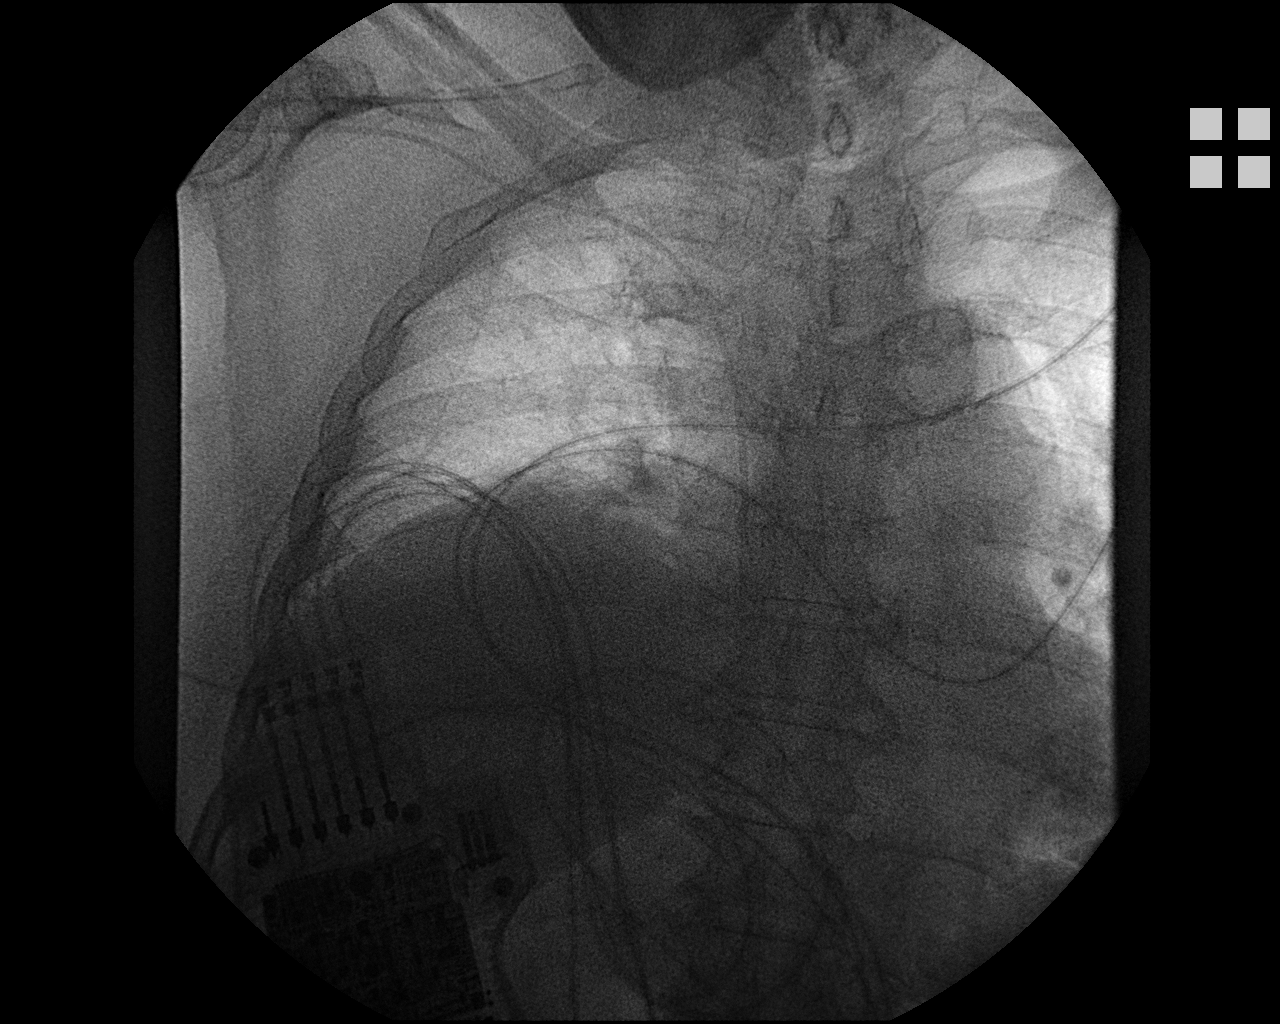
[im 2/2]
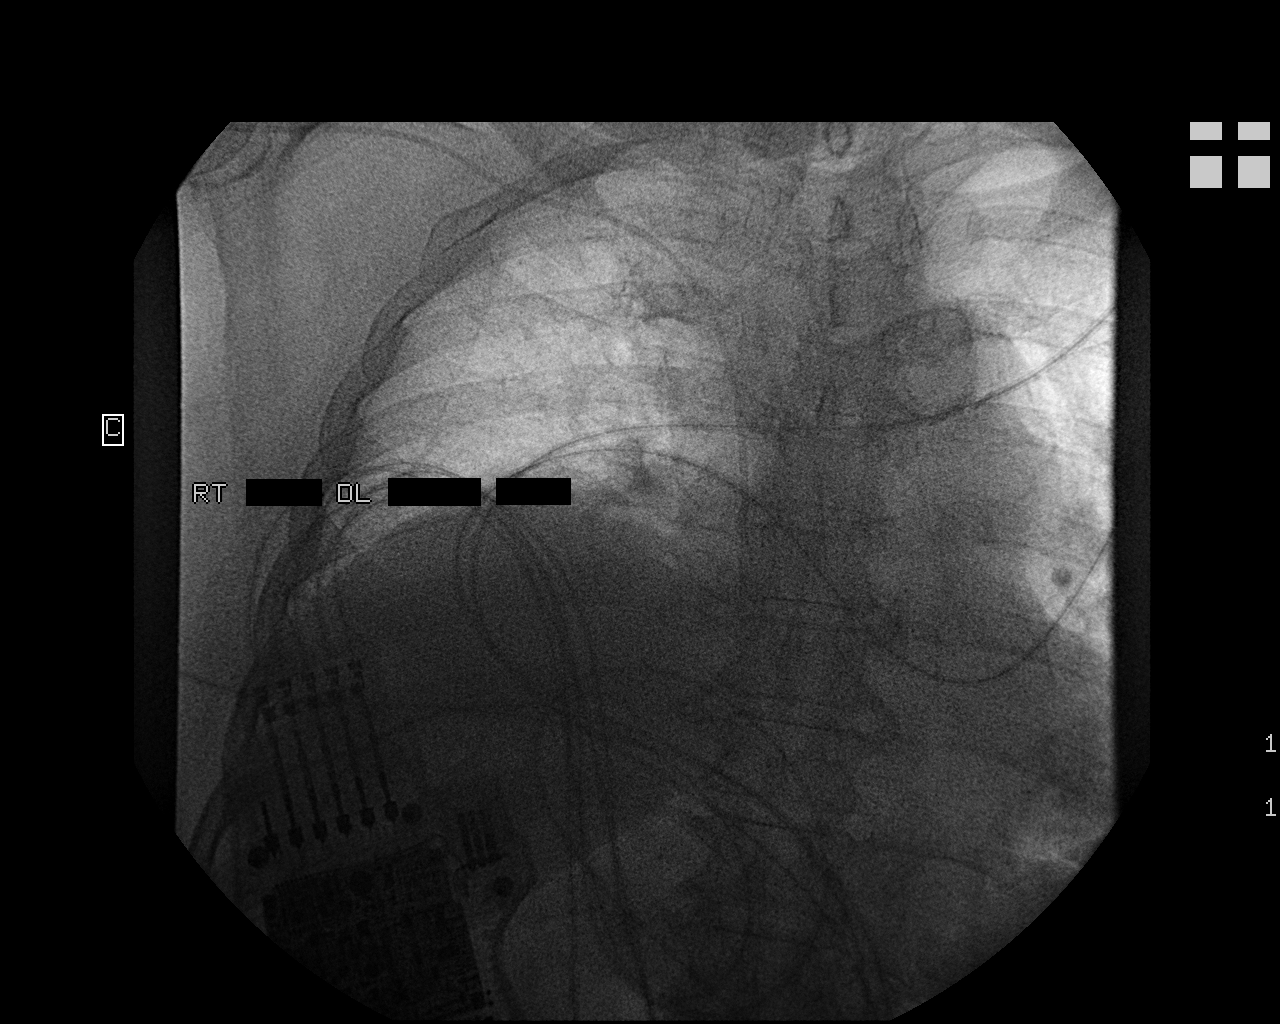

[2 of 2 positions shown; findings below may reference images not displayed]

EXAM:
Right upper extremity PICC LINE PLACEMENT WITH ULTRASOUND AND
FLUOROSCOPIC GUIDANCE

FLUOROSCOPY TIME:  6 seconds.

PROCEDURE:
The patient was advised of the possible risks andcomplications and
agreed to undergo the procedure. The patient was then brought to the
angiographic suite for the procedure.

The right arm was prepped with chlorhexidine, drapedin the usual
sterile fashion using maximum barrier technique (cap and mask,
sterile gown, sterile gloves, large sterile sheet, hand hygiene and
cutaneous antisepsis) and infiltrated locally with 1% Lidocaine.

Ultrasound demonstrated patency of the right cephalic vein, and this
was documented with an image. Under real-time ultrasound guidance,
this vein was accessed with a 21 gauge micropuncture needle and
image documentation was performed. A [DATE] wire was introduced in to
the vein. Over this, a 5 French double lumen Power PICC, trimmed to
37 cm, was advanced to the lower SVC/right atrial junction.
Fluoroscopy during the procedure and fluoro spot radiograph confirms
appropriate catheter position. The catheter was flushed and covered
with asterile dressing.

Complications: None
IMPRESSION: Successful right arm Power PICC line placement with ultrasound and
fluoroscopic guidance. The catheter is ready for use.

## 2016-02-20 ENCOUNTER — Other Ambulatory Visit: Payer: Self-pay | Admitting: Internal Medicine

## 2016-08-28 ENCOUNTER — Inpatient Hospital Stay (HOSPITAL_COMMUNITY)
Admission: EM | Admit: 2016-08-28 | Discharge: 2016-08-30 | DRG: 310 | Disposition: A | Discharge status: Home / Self Care | Payer: Medicare Other | Attending: Internal Medicine | Admitting: Internal Medicine

## 2016-08-28 ENCOUNTER — Emergency Department (HOSPITAL_COMMUNITY): Payer: Medicare Other

## 2016-08-28 ENCOUNTER — Encounter (HOSPITAL_COMMUNITY): Payer: Self-pay | Admitting: Emergency Medicine

## 2016-08-28 DIAGNOSIS — Z8249 Family history of ischemic heart disease and other diseases of the circulatory system: Secondary | ICD-10-CM | POA: Diagnosis not present

## 2016-08-28 DIAGNOSIS — F410 Panic disorder [episodic paroxysmal anxiety] without agoraphobia: Secondary | ICD-10-CM | POA: Diagnosis present

## 2016-08-28 DIAGNOSIS — R9431 Abnormal electrocardiogram [ECG] [EKG]: Secondary | ICD-10-CM | POA: Diagnosis present

## 2016-08-28 DIAGNOSIS — I214 Non-ST elevation (NSTEMI) myocardial infarction: Secondary | ICD-10-CM | POA: Diagnosis not present

## 2016-08-28 DIAGNOSIS — E785 Hyperlipidemia, unspecified: Secondary | ICD-10-CM | POA: Diagnosis present

## 2016-08-28 DIAGNOSIS — E039 Hypothyroidism, unspecified: Secondary | ICD-10-CM | POA: Diagnosis present

## 2016-08-28 DIAGNOSIS — R7989 Other specified abnormal findings of blood chemistry: Secondary | ICD-10-CM | POA: Diagnosis not present

## 2016-08-28 DIAGNOSIS — R002 Palpitations: Secondary | ICD-10-CM | POA: Diagnosis present

## 2016-08-28 DIAGNOSIS — Z833 Family history of diabetes mellitus: Secondary | ICD-10-CM

## 2016-08-28 DIAGNOSIS — R0789 Other chest pain: Secondary | ICD-10-CM | POA: Diagnosis present

## 2016-08-28 DIAGNOSIS — I2510 Atherosclerotic heart disease of native coronary artery without angina pectoris: Secondary | ICD-10-CM | POA: Diagnosis not present

## 2016-08-28 DIAGNOSIS — R778 Other specified abnormalities of plasma proteins: Secondary | ICD-10-CM | POA: Diagnosis present

## 2016-08-28 DIAGNOSIS — I10 Essential (primary) hypertension: Secondary | ICD-10-CM | POA: Diagnosis present

## 2016-08-28 DIAGNOSIS — R110 Nausea: Secondary | ICD-10-CM | POA: Diagnosis present

## 2016-08-28 DIAGNOSIS — E038 Other specified hypothyroidism: Secondary | ICD-10-CM | POA: Diagnosis not present

## 2016-08-28 HISTORY — DX: Other chronic pain: G89.29

## 2016-08-28 HISTORY — DX: Low back pain: M54.5

## 2016-08-28 HISTORY — DX: Depression, unspecified: F32.A

## 2016-08-28 HISTORY — DX: Major depressive disorder, single episode, unspecified: F32.9

## 2016-08-28 HISTORY — DX: Non-ST elevation (NSTEMI) myocardial infarction: I21.4

## 2016-08-28 HISTORY — DX: Low back pain, unspecified: M54.50

## 2016-08-28 HISTORY — DX: Pneumonia, unspecified organism: J18.9

## 2016-08-28 LAB — COMPREHENSIVE METABOLIC PANEL
ALK PHOS: 81 U/L (ref 38–126)
ALT: 12 U/L — AB (ref 14–54)
AST: 22 U/L (ref 15–41)
Albumin: 3.4 g/dL — ABNORMAL LOW (ref 3.5–5.0)
Anion gap: 8 (ref 5–15)
BILIRUBIN TOTAL: 0.5 mg/dL (ref 0.3–1.2)
BUN: 5 mg/dL — AB (ref 6–20)
CALCIUM: 9.8 mg/dL (ref 8.9–10.3)
CO2: 29 mmol/L (ref 22–32)
CREATININE: 0.84 mg/dL (ref 0.44–1.00)
Chloride: 95 mmol/L — ABNORMAL LOW (ref 101–111)
GFR calc Af Amer: >60 mL/min (ref >60)
GFR, EST NON AFRICAN AMERICAN: 59 mL/min — AB (ref >60)
GLUCOSE: 95 mg/dL (ref 65–99)
Potassium: 4.2 mmol/L (ref 3.5–5.1)
Sodium: 132 mmol/L — ABNORMAL LOW (ref 135–145)
TOTAL PROTEIN: 7.3 g/dL (ref 6.5–8.1)

## 2016-08-28 LAB — I-STAT TROPONIN, ED
TROPONIN I, POC: 0.1 ng/mL — AB (ref 0.00–0.08)
Troponin i, poc: 0.08 ng/mL (ref 0.00–0.08)
Troponin i, poc: 0.11 ng/mL (ref 0.00–0.08)

## 2016-08-28 LAB — CBC WITH DIFFERENTIAL/PLATELET
BASOS ABS: 0 10*3/uL (ref 0.0–0.1)
Basophils Relative: 0 %
Eosinophils Absolute: 0.1 10*3/uL (ref 0.0–0.7)
Eosinophils Relative: 2 %
HEMATOCRIT: 42.5 % (ref 36.0–46.0)
HEMOGLOBIN: 13.6 g/dL (ref 12.0–15.0)
LYMPHS PCT: 38 %
Lymphs Abs: 3.2 10*3/uL (ref 0.7–4.0)
MCH: 28.7 pg (ref 26.0–34.0)
MCHC: 32 g/dL (ref 30.0–36.0)
MCV: 89.7 fL (ref 78.0–100.0)
MONO ABS: 0.7 10*3/uL (ref 0.1–1.0)
Monocytes Relative: 8 %
NEUTROS ABS: 4.3 10*3/uL (ref 1.7–7.7)
NEUTROS PCT: 52 %
Platelets: 296 10*3/uL (ref 150–400)
RBC: 4.74 MIL/uL (ref 3.87–5.11)
RDW: 14.9 % (ref 11.5–15.5)
WBC: 8.3 10*3/uL (ref 4.0–10.5)

## 2016-08-28 LAB — TROPONIN I
TROPONIN I: 0.11 ng/mL — AB (ref <0.03)
Troponin I: 0.11 ng/mL (ref <0.03)

## 2016-08-28 LAB — LIPASE, BLOOD: LIPASE: 23 U/L (ref 11–51)

## 2016-08-28 MED ORDER — HEPARIN BOLUS VIA INFUSION: 60.0000 [IU]/kg | Freq: Once | INTRAVENOUS | Status: DC

## 2016-08-28 MED ORDER — SIMVASTATIN 40 MG PO TABS: 40.0000 mg | ORAL_TABLET | Freq: Every day | ORAL | Status: DC

## 2016-08-28 MED ORDER — OLOPATADINE HCL 0.1 % OP SOLN
1.0000 [drp] | Freq: Every day | OPHTHALMIC | Status: DC | PRN
  Filled 2016-08-28: qty 5

## 2016-08-28 MED ORDER — ASPIRIN 325 MG PO TABS
325.0000 mg | ORAL_TABLET | Freq: Once | ORAL | Status: DC
  Filled 2016-08-28: qty 1

## 2016-08-28 MED ORDER — ONDANSETRON HCL 4 MG/2ML IJ SOLN: 4.0000 mg | Freq: Four times a day (QID) | INTRAMUSCULAR | Status: DC | PRN

## 2016-08-28 MED ORDER — HEPARIN (PORCINE) IN NACL 100-0.45 UNIT/ML-% IJ SOLN
800.0000 [IU]/h | INTRAMUSCULAR | Status: DC
  Administered 2016-08-28: 800 [IU]/h via INTRAVENOUS
  Filled 2016-08-28: qty 250

## 2016-08-28 MED ORDER — ALPRAZOLAM 0.5 MG PO TABS
0.5000 mg | ORAL_TABLET | Freq: Two times a day (BID) | ORAL | Status: DC
  Administered 2016-08-28 – 2016-08-30 (×3): 0.5 mg via ORAL
  Filled 2016-08-28 (×4): qty 1

## 2016-08-28 MED ORDER — HEPARIN BOLUS VIA INFUSION
3000.0000 [IU] | Freq: Once | INTRAVENOUS | Status: AC
  Administered 2016-08-28: 3000 [IU] via INTRAVENOUS
  Filled 2016-08-28: qty 3000

## 2016-08-28 MED ORDER — LISINOPRIL 10 MG PO TABS
10.0000 mg | ORAL_TABLET | Freq: Every day | ORAL | Status: DC
  Administered 2016-08-29 – 2016-08-30 (×2): 10 mg via ORAL
  Filled 2016-08-28 (×2): qty 1

## 2016-08-28 MED ORDER — ACETAMINOPHEN 325 MG PO TABS: 650.0000 mg | ORAL_TABLET | ORAL | Status: DC | PRN

## 2016-08-28 MED ORDER — LORATADINE 10 MG PO TABS
10.0000 mg | ORAL_TABLET | Freq: Every day | ORAL | Status: DC
  Administered 2016-08-29 – 2016-08-30 (×2): 10 mg via ORAL
  Filled 2016-08-28 (×2): qty 1

## 2016-08-28 MED ORDER — PROPRANOLOL HCL ER 60 MG PO CP24
60.0000 mg | ORAL_CAPSULE | Freq: Every day | ORAL | Status: DC
  Administered 2016-08-29 – 2016-08-30 (×2): 60 mg via ORAL
  Filled 2016-08-28 (×2): qty 1

## 2016-08-28 MED ORDER — DIPHENHYDRAMINE HCL 25 MG PO CAPS
25.0000 mg | ORAL_CAPSULE | Freq: Four times a day (QID) | ORAL | Status: DC | PRN
  Administered 2016-08-29 – 2016-08-30 (×2): 25 mg via ORAL
  Filled 2016-08-28 (×2): qty 1

## 2016-08-28 MED ORDER — PANTOPRAZOLE SODIUM 40 MG PO TBEC
40.0000 mg | DELAYED_RELEASE_TABLET | Freq: Every day | ORAL | Status: DC
  Administered 2016-08-29 – 2016-08-30 (×2): 40 mg via ORAL
  Filled 2016-08-28 (×2): qty 1

## 2016-08-28 MED ORDER — FAMOTIDINE 20 MG PO TABS
40.0000 mg | ORAL_TABLET | Freq: Every day | ORAL | Status: DC
  Administered 2016-08-28: 40 mg via ORAL
  Filled 2016-08-28 (×2): qty 2

## 2016-08-28 MED ORDER — HEPARIN (PORCINE) IN NACL 100-0.45 UNIT/ML-% IJ SOLN: 14.0000 [IU]/kg/h | Freq: Once | INTRAMUSCULAR | Status: DC

## 2016-08-28 MED ORDER — ASPIRIN EC 81 MG PO TBEC
81.0000 mg | DELAYED_RELEASE_TABLET | Freq: Every day | ORAL | Status: DC
  Administered 2016-08-29 – 2016-08-30 (×2): 81 mg via ORAL
  Filled 2016-08-28 (×2): qty 1

## 2016-08-28 MED ORDER — SODIUM CHLORIDE 0.9 % IV BOLUS (SEPSIS)
1000.0000 mL | Freq: Once | INTRAVENOUS | Status: AC
  Administered 2016-08-28: 1000 mL via INTRAVENOUS

## 2016-08-28 MED ORDER — NITROGLYCERIN 0.4 MG SL SUBL: 0.4000 mg | SUBLINGUAL_TABLET | SUBLINGUAL | Status: DC | PRN

## 2016-08-28 MED ORDER — DIPHENHYDRAMINE HCL 25 MG PO TABS
25.0000 mg | ORAL_TABLET | Freq: Four times a day (QID) | ORAL | Status: DC | PRN
  Filled 2016-08-28: qty 1

## 2016-08-28 MED ORDER — LEVOTHYROXINE SODIUM 50 MCG PO TABS
50.0000 ug | ORAL_TABLET | Freq: Every day | ORAL | Status: DC
  Administered 2016-08-29 – 2016-08-30 (×2): 50 ug via ORAL
  Filled 2016-08-28 (×2): qty 1

## 2016-08-28 MED ORDER — FLUTICASONE PROPIONATE 50 MCG/ACT NA SUSP
2.0000 | Freq: Every day | NASAL | Status: DC
  Filled 2016-08-28: qty 16

## 2016-08-28 NOTE — ED Notes (Signed)
Attempted to call report

## 2016-08-28 NOTE — H&P (Signed)
History and Physical    Tammy Fernandez ZOX:[id number] DOB: August 18, 1924 DOA: 08/28/2016  PCP: Lurena Joiner Consultants:  None Patient coming from: home - lives with daughter, son; Jackey Loge; Adine Madura, daughter - [phone]; Mare Ferrari, son  Chief Complaint: chest pain  HPI: Tammy Fernandez is a 82 y.o. female with medical history significant of HTN, hypothyroidism presenting with constipation/epigastric abdominal pain.  Patient with h/o constipation, GERD.  Friday, got very constipated and took medicine without relief.  Friday night, heart rate went up to 120.  Decided not to come to ER that night.  The next night, same thing happened, kept her up most of the night.  Took some prune juice with some improvement Sunday and still having BMs today.  Slept better Sunday night.  Had appointment with doctor today, he did an EKG and asked her to come to the hospital.  No chest pain.  No SOB.   ED Course: Per Dr. Clayborne Dana: 82 yo F w/ intermittent severe CP from epigastric to right neck multiple times over the weekend associated with nausea, improved slightly with GERD meds but not completely. Had episodes of palpitations at night during the weekend as well.  Patient relates to it to gi. However it is worse than normal and with palpitations (could be arrhythmia 2/2 ischemia) which are new and the ecg's at PCP and here with new TWI's. Positive troponin. I consulted crdiology for heparin/cardiology consult for admission.   Review of Systems: As per HPI; otherwise 10 point review of systems reviewed and negative.   Ambulatory Status:  Ambulates with cane or walker, walks daily  Past Medical History:  Diagnosis Date  . Hypertension   . Thyroid disease     Past Surgical History:  Procedure Laterality Date  . ABDOMINAL HYSTERECTOMY      Social History   Social History  . Marital status: Widowed    Spouse name: N/A  . Number of children: N/A  . Years of education: N/A   Occupational History  .  Not on file.   Social History Main Topics  . Smoking status: Never Smoker  . Smokeless tobacco: Never Used  . Alcohol use No  . Drug use: No  . Sexual activity: Not on file   Other Topics Concern  . Not on file   Social History Narrative  . No narrative on file    Allergies  Allergen Reactions  . Crestor [Rosuvastatin Calcium] Other (See Comments)    Muscle Aches  . Keflex [Cephalexin] Other (See Comments)    dizziness  . Sulfa Antibiotics Hives    History reviewed. No pertinent family history.  Prior to Admission medications   Medication Sig Start Date End Date Taking? Authorizing Provider  ALPRAZolam Prudy Feeler) 1 MG tablet Take 0.5 mg by mouth 2 (two) times daily. 08/18/16  Yes Historical Provider, MD  aspirin EC 81 MG tablet Take 81 mg by mouth daily.   Yes Historical Provider, MD  diphenhydrAMINE (BENADRYL) 25 MG tablet Take 25 mg by mouth every 6 (six) hours as needed for itching or allergies.   Yes Historical Provider, MD  fluticasone (FLONASE) 50 MCG/ACT nasal spray Place 2 sprays into both nostrils daily.  08/16/16  Yes Historical Provider, MD  levothyroxine (SYNTHROID, LEVOTHROID) 50 MCG tablet Take 50 mcg by mouth daily before breakfast. 06/21/16  Yes Historical Provider, MD  lisinopril (PRINIVIL,ZESTRIL) 5 MG tablet Take 10 mg by mouth daily.  08/16/16  Yes Historical Provider, MD  loratadine (CLARITIN) 10 MG tablet Take  10 mg by mouth daily.   Yes Historical Provider, MD  meclizine (ANTIVERT) 12.5 MG tablet Take 12.5 mg by mouth every 6 (six) hours as needed for dizziness. 08/16/16  Yes Historical Provider, MD  olopatadine (PATANOL) 0.1 % ophthalmic solution Place 1 drop into both eyes daily as needed for allergies.  07/31/16  Yes Historical Provider, MD  omeprazole (PRILOSEC) 20 MG capsule Take 20 mg by mouth every morning. 08/16/16  Yes Historical Provider, MD  propranolol ER (INDERAL LA) 60 MG 24 hr capsule Take 60 mg by mouth daily. 07/23/16  Yes Historical Provider, MD    ranitidine (ZANTAC) 300 MG tablet Take 300 mg by mouth every evening. 08/17/16  Yes Historical Provider, MD    Physical Exam: Vitals:   08/28/16 1849 08/28/16 1943 08/28/16 2000 08/28/16 2121  BP: 156/71 167/62 154/62 (!) 182/84  Pulse: 70 80 70 77  Resp: 19 23 19 17   Temp:    97.9 F (36.6 C)  TempSrc:    Oral  SpO2: 93% 92% 94%   Weight:    81.2 kg (179 lb)  Height:    5\' 2"  (1.575 m)     General:  Appears calm and comfortable and is NAD Eyes:  PERRL, EOMI, normal lids, iris ENT:  grossly normal hearing, lips & tongue, mmm Neck:  no LAD, masses or thyromegaly Cardiovascular:  RRR, no m/r/g. No LE edema.  Respiratory:  CTA bilaterally, no w/r/r. Normal respiratory effort. Abdomen:  soft, ntnd, NABS Skin:  no rash or induration seen on limited exam Musculoskeletal:  grossly normal tone BUE/BLE, good ROM, no bony abnormality Psychiatric:  grossly normal mood and affect, speech fluent and appropriate, AOx3 Neurologic:  CN 2-12 grossly intact, moves all extremities in coordinated fashion, sensation intact  Labs on Admission: I have personally reviewed following labs and imaging studies  CBC:  Recent Labs Lab 08/28/16 1346  WBC 8.3  NEUTROABS 4.3  HGB 13.6  HCT 42.5  MCV 89.7  PLT 296   Basic Metabolic Panel:  Recent Labs Lab 08/28/16 1346  NA 132*  K 4.2  CL 95*  CO2 29  GLUCOSE 95  BUN 5*  CREATININE 0.84  CALCIUM 9.8   GFR: Estimated Creatinine Clearance: 43.9 mL/min (by C-G formula based on SCr of 0.84 mg/dL). Liver Function Tests:  Recent Labs Lab 08/28/16 1346  AST 22  ALT 12*  ALKPHOS 81  BILITOT 0.5  PROT 7.3  ALBUMIN 3.4*    Recent Labs Lab 08/28/16 1346  LIPASE 23   No results for input(s): AMMONIA in the last 168 hours. Coagulation Profile: No results for input(s): INR, PROTIME in the last 168 hours. Cardiac Enzymes:  Recent Labs Lab 08/28/16 1346 08/28/16 2139  TROPONINI 0.11* 0.11*   BNP (last 3 results) No results  for input(s): PROBNP in the last 8760 hours. HbA1C: No results for input(s): HGBA1C in the last 72 hours. CBG: No results for input(s): GLUCAP in the last 168 hours. Lipid Profile: No results for input(s): CHOL, HDL, LDLCALC, TRIG, CHOLHDL, LDLDIRECT in the last 72 hours. Thyroid Function Tests: No results for input(s): TSH, T4TOTAL, FREET4, T3FREE, THYROIDAB in the last 72 hours. Anemia Panel: No results for input(s): VITAMINB12, FOLATE, FERRITIN, TIBC, IRON, RETICCTPCT in the last 72 hours. Urine analysis: No results found for: COLORURINE, APPEARANCEUR, LABSPEC, PHURINE, GLUCOSEU, HGBUR, BILIRUBINUR, KETONESUR, PROTEINUR, UROBILINOGEN, NITRITE, LEUKOCYTESUR  Creatinine Clearance: Estimated Creatinine Clearance: 43.9 mL/min (by C-G formula based on SCr of 0.84 mg/dL).  Sepsis Labs: @LABRCNTIP (procalcitonin:4,lacticidven:4) )  No results found for this or any previous visit (from the past 240 hour(s)).   Radiological Exams on Admission: Dg Chest 2 View  Result Date: 08/28/2016 CLINICAL DATA:  Constipation for 3 or 4 days. Palpitations with attempted bowel movement. EXAM: CHEST  2 VIEW COMPARISON:  None. FINDINGS: There is elevation of the right hemidiaphragm obscuring the right hilum and right heart border. This results in poor aeration of the right lung. No pneumothorax. Within visualize limits, the heart, hila, and mediastinum are normal. No pulmonary nodules or masses. No focal infiltrates. Mild anterior wedging of a lower thoracic vertebral body is age indeterminate but may not be acute. IMPRESSION: 1. Mild anterior wedging of a lower thoracic vertebral body, age indeterminate. This may not be acute. 2. Significant elevation of the right hemidiaphragm. Electronically Signed   By: Gerome Samavid  Williams III M.D   On: 08/28/2016 14:18    EKG: Independently reviewed.  NSR with rate 67; T wave inversions in inferior/lateral leads  Assessment/Plan Principal Problem:   NSTEMI (non-ST elevated  myocardial infarction) [facility](HCC) Active Problems:   Essential hypertension   Hypothyroidism   NSTEMI -Patient with abdominal pain, specifically denies chest pain -CXR unremarkable.   -Initial cardiac enzymes negative but troponin increased after initial presentation.   -EKG indicative of acute ischemia suggestive of inferolateral MI.   -Will plan to admit on telemetry -Continue to cycle cardiac enzymes q6h until peaked and decreasing -repeat EKG in AM -Heparin drip as per cardiology x 48 hours, medical management only without intervention -morphine given -statin given -Risk factor stratification with FLP; will also check TSH -Cardiology consultation in ER -Echo in AM  HTN -Continue Lisinopril, Propranolol -Consider substitution of current beta blocker with metoprolol or carvedilol  Hypothyroidism -Check TSH -Continue Synthroid at current dose for now   DVT prophylaxis: Heparin drip Code Status:  Full - confirmed with patient/family Family Communication: Family present at bedside  Disposition Plan:  Home once clinically improved Consults called: Cardiology  Admission status: Admit to telemetry    Jonah BlueJennifer Jalayah Gutridge MD [facility]  If 7PM-7AM, please contact night-coverage [email or web address] Password [email or web address]  08/29/2016, 1:40 AM

## 2016-08-28 NOTE — Consult Note (Signed)
CONSULT NOTE  Date: 08/28/2016               Patient Name:  Tammy Fernandez MRN: [id number]  DOB: [birth year] Age / Sex: 82 y.o., female        PCP: No primary care provider on file. Primary Cardiologist: Luvenia HellerNew /Nahser            Referring Physician: Mesner              Reason for Consult: Chest pain          Also has a 2nd MRN MRN:  [id number]  History of Present Illness: Patient is a 82 y.o. female with a PMHx of HTN, CVA  and hypothyroidism , who [facility] for evaluation of  Chest pain .   Primary MD is Elias Elseobert Reade,   Has GERD and constipation as chronic issues Last Friday, was very constiptated.  Took several laxitaves and prune juice  Did not sleep for 2 nights. Hear was running fast  eventually has a bowel movement on Sunday   No CP  Was seen by Dr. Nicholos Johnseade today in the office.  Told Dr. Nicholos Johnseade about the fast HR . ECG showed some changes. Was referred to the ER for further eval  Lots of gas. No CP  No dyspnea.  Lives at home with her children.  Walks with a walker - since her CVA - was admitted under her other MRN for the stroke hospitalization  Non smoker No etoh  + family hx of CVA ( father )     Medications: Outpatient medications:  (Not in a hospital admission)  Current medications: Current Facility-Administered Medications  Medication Dose Route Frequency Provider Last Rate Last Dose  . aspirin tablet 325 mg  325 mg Oral Once Marily MemosJason Mesner, MD      . heparin ADULT infusion 100 units/mL (25000 units/27550mL sodium chloride 0.45%)  800 Units/hr Intravenous Continuous Gerilyn Nestlehuy D Dang, RPH 8 mL/hr at 08/28/16 1615 800 Units/hr at 08/28/16 1615   Current Outpatient Prescriptions  Medication Sig Dispense Refill  . ALPRAZolam (XANAX) 1 MG tablet Take 0.5 mg by mouth 2 (two) times daily.  4  . aspirin EC 81 MG tablet Take 81 mg by mouth daily.    . diphenhydrAMINE (BENADRYL) 25 MG tablet Take 25 mg by mouth every 6 (six) hours as  needed for itching or allergies.    . fluticasone (FLONASE) 50 MCG/ACT nasal spray Place 2 sprays into both nostrils daily.     Marland Kitchen. levothyroxine (SYNTHROID, LEVOTHROID) 50 MCG tablet Take 50 mcg by mouth daily before breakfast.    . lisinopril (PRINIVIL,ZESTRIL) 5 MG tablet Take 10 mg by mouth daily.     Marland Kitchen. loratadine (CLARITIN) 10 MG tablet Take 10 mg by mouth daily.    . meclizine (ANTIVERT) 12.5 MG tablet Take 12.5 mg by mouth every 6 (six) hours as needed for dizziness.    Marland Kitchen. olopatadine (PATANOL) 0.1 % ophthalmic solution Place 1 drop into both eyes daily as needed for allergies.     Marland Kitchen. omeprazole (PRILOSEC) 20 MG capsule Take 20 mg by mouth every morning.    . propranolol ER (INDERAL LA) 60 MG 24 hr capsule Take 60 mg by mouth daily.    . ranitidine (ZANTAC) 300 MG tablet Take 300 mg by mouth every evening.       Allergies  Allergen Reactions  . Crestor [Rosuvastatin Calcium] Other (See Comments)  Muscle Aches  . Keflex [Cephalexin] Other (See Comments)    dizziness  . Sulfa Antibiotics Hives     Past Medical History:  Diagnosis Date  . Hypertension   . Thyroid disease     History reviewed. No pertinent surgical history.  History reviewed. No pertinent family history.  Social History:  reports that she has never smoked. She has never used smokeless tobacco. She reports that she does not drink alcohol. Her drug history is not on file.   Review of Systems: Constitutional:  admits to fever, chills, diaphoresis, appetite change and  Constipation   HEENT: denies photophobia, eye pain, redness, hearing loss, ear pain, congestion, sore throat, rhinorrhea, sneezing, neck pain, neck stiffness and tinnitus.  Respiratory: denies SOB, DOE, cough, chest tightness, and wheezing.  Cardiovascular: denies chest pain, palpitations and leg swelling.  Gastrointestinal: admits to , constipation, + gas   Genitourinary: denies dysuria, urgency, frequency, hematuria, flank pain and difficulty  urinating.  Musculoskeletal: denies  myalgias, back pain, joint swelling, arthralgias and gait problem.   Skin: denies pallor, rash and wound.  Neurological: admits to  Weakness since her CVA   Hematological: denies adenopathy, easy bruising, personal or family bleeding history.  Psychiatric/ Behavioral: denies suicidal ideation, mood changes, confusion, nervousness, sleep disturbance and agitation.    Physical Exam: BP 126/60 (BP Location: Right Arm) Comment: Simultaneous filing. User may not have seen previous data.  Pulse 67 Comment: Simultaneous filing. User may not have seen previous data.  Temp 97.4 F (36.3 C) (Oral)   Resp 16 Comment: Simultaneous filing. User may not have seen previous data.  Ht 5\' 2"  (1.575 m)   Wt 179 lb (81.2 kg)   SpO2 97% Comment: Simultaneous filing. User may not have seen previous data.  BMI 32.74 kg/m   Wt Readings from Last 3 Encounters:  08/28/16 179 lb (81.2 kg)    General: Vital signs reviewed and noted. Well-developed, well-nourished, in no acute distress; alert,   Head: Normocephalic, atraumatic, sclera anicteric,   Neck: Supple. Negative for carotid bruits. No JVD   Lungs:  Clear bilaterally, no  wheezes, rales, or rhonchi. Breathing is normal   Heart: RRR with S1 S2. No murmurs, rubs, or gallops   Abdomen/ GI :  Soft, non-tender, non-distended with normoactive bowel sounds. No hepatomegaly. No rebound/guarding. No obvious abdominal masses   MSK: Strength and the appear normal for age.   Extremities: No clubbing or cyanosis. No edema.  Distal pedal pulses are 2+ and equal   Neurologic:  CN are grossly intact,  No obvious motor or sensory defect.  Alert and oriented X 3. Moves all extremities spontaneously.  Psych: Responds to questions appropriately with a normal affect.     Lab results: Basic Metabolic Panel:  Recent Labs Lab 08/28/16 1346  NA 132*  K 4.2  CL 95*  CO2 29  GLUCOSE 95  BUN 5*  CREATININE 0.84  CALCIUM 9.8     Liver Function Tests:  Recent Labs Lab 08/28/16 1346  AST 22  ALT 12*  ALKPHOS 81  BILITOT 0.5  PROT 7.3  ALBUMIN 3.4*    Recent Labs Lab 08/28/16 1346  LIPASE 23   No results for input(s): AMMONIA in the last 168 hours.  CBC:  Recent Labs Lab 08/28/16 1346  WBC 8.3  NEUTROABS 4.3  HGB 13.6  HCT 42.5  MCV 89.7  PLT 296    Cardiac Enzymes:  Recent Labs Lab 08/28/16 1346  TROPONINI 0.11*  BNP: Invalid input(s): POCBNP  CBG: No results for input(s): GLUCAP in the last 168 hours.  Coagulation Studies: No results for input(s): LABPROT, INR in the last 72 hours.   Other results: Personal review of EKG shows :  -  NSR with TWI in the inferior and lateral leads   Imaging: Dg Chest 2 View  Result Date: 08/28/2016 CLINICAL DATA:  Constipation for 3 or 4 days. Palpitations with attempted bowel movement. EXAM: CHEST  2 VIEW COMPARISON:  None. FINDINGS: There is elevation of the right hemidiaphragm obscuring the right hilum and right heart border. This results in poor aeration of the right lung. No pneumothorax. Within visualize limits, the heart, hila, and mediastinum are normal. No pulmonary nodules or masses. No focal infiltrates. Mild anterior wedging of a lower thoracic vertebral body is age indeterminate but may not be acute. IMPRESSION: 1. Mild anterior wedging of a lower thoracic vertebral body, age indeterminate. This may not be acute. 2. Significant elevation of the right hemidiaphragm. Electronically Signed   By: Gerome Sam III M.D   On: 08/28/2016 14:18        Assessment & Plan:  1. Abnormal ECG Was noted to have an abn ecg by her primary . Was sent to [facility] - has changes that are suggestive of an inf. Lat MI Troponin levels are mildly elevate. Has no CP or dyspnea at all  Not ambulatory since her CVA. Had a long discussion with patient and family . She is a poor candidate for invasive procedures. Would favor  conservative approach.   I do not anticipate doing any further ischemic eval at this point   Echo  Heparin for 48 hours Continue lisinopril - increase as needed ? Coreg. Serial troponin levels       Vesta Mixer, Montez Hageman., MD, [facility] 08/28/2016, 4:32 PM Office - [phone] Pager [phone]

## 2016-08-28 NOTE — ED Notes (Signed)
MD Mesner notified of patient troponin 0.11

## 2016-08-28 NOTE — Progress Notes (Signed)
ANTICOAGULATION CONSULT NOTE - Initial Consult  Pharmacy Consult:  Heparin Indication: chest pain/ACS  Allergies  Allergen Reactions  . Sulfa Antibiotics Hives  . Crestor [Rosuvastatin Calcium] Other (See Comments)    Muscle Aches    Patient Measurements: Height: 5\' 2"  (157.5 cm) Weight: 179 lb (81.2 kg) IBW/kg (Calculated) : 50.1 Heparin Dosing Weight: 68 kg  Vital Signs: Temp: 97.4 F (36.3 C) (09/26 1239) Temp Source: Oral (09/26 1239) BP: 135/57 (09/26 1346) Pulse Rate: 66 (09/26 1346)  Labs:  Recent Labs  08/28/16 1346  HGB 13.6  HCT 42.5  PLT 296  CREATININE 0.84  TROPONINI 0.11*    Estimated Creatinine Clearance: 43.9 mL/min (by C-G formula based on SCr of 0.84 mg/dL).   Medical History: Past Medical History:  Diagnosis Date  . Hypertension   . Thyroid disease       Assessment: Tammy Fernandez presented from PCP's office s/p having palpitations while trying to have a bowel movement.  EKG shows inverted T-waves, so patient was sent to the ED for evaluation.  Pharmacy consulted to initiate IV heparin for rule out ACS.  Baseline labs reviewed.  Patient stated she is not on any blood thinners at home.   Goal of Therapy:  Heparin level 0.3-0.7 units/ml Monitor platelets by anticoagulation protocol: Yes    Plan:  - Heparin 3000 units IV bolus x 1, then - Heparin gtt at 800 units/hr - Check 8 hr heparin level - Daily heparin level and CBC   Yonatan Guitron D. Laney Potashang, PharmD, BCPS Pager:  [phone] 08/28/2016, 3:08 PM

## 2016-08-28 NOTE — ED Provider Notes (Signed)
[facility]-EMERGENCY DEPT Provider Note   CSN: [id number] Arrival date & time: 08/28/16  1227     History   Chief Complaint Chief Complaint  Patient presents with  . Abnormal ECG    HPI Tammy Fernandez is a 82 y.o. female.   Chest Pain   This is a new problem. The current episode started more than 2 days ago. The problem occurs daily. The pain is present in the substernal region and epigastric region. The pain is at a severity of 5/10. The pain is mild. The quality of the pain is described as brief. The pain radiates to the right neck, right shoulder and left neck. Pertinent negatives include no abdominal pain, no dizziness, no exertional chest pressure and no nausea. She has tried nothing for the symptoms. The treatment provided no relief. Risk factors include being elderly and obesity.  Her past medical history is significant for anxiety/panic attacks.  Her family medical history is significant for CAD, diabetes, heart disease, hyperlipidemia and hypertension.    Past Medical History:  Diagnosis Date  . Hypertension   . Thyroid disease     There are no active problems to display for this patient.   History reviewed. No pertinent surgical history.  OB History    No data available       Home Medications    Prior to Admission medications   Not on File    Family History History reviewed. No pertinent family history.  Social History Social History  Substance Use Topics  . Smoking status: Never Smoker  . Smokeless tobacco: Never Used  . Alcohol use No     Allergies   Sulfa antibiotics and Crestor [rosuvastatin calcium]   Review of Systems Review of Systems  Cardiovascular: Positive for chest pain.  Gastrointestinal: Negative for abdominal pain and nausea.  Neurological: Negative for dizziness.  All other systems reviewed and are negative.    Physical Exam Updated Vital Signs BP (!) 142/57 (BP Location: Left Arm)   Pulse 71   Temp 97.7 F (36.5 C)  (Oral)   Resp 15   Ht 5\' 2"  (1.575 m)   Wt 179 lb (81.2 kg)   SpO2 94%   BMI 32.74 kg/m   Physical Exam  Constitutional: She is oriented to person, place, and time. She appears well-developed and well-nourished. No distress.  HENT:  Head: Normocephalic and atraumatic.  Eyes: Conjunctivae are normal.  Neck: Neck supple.  Cardiovascular: Normal rate and regular rhythm.   No murmur heard. Pulmonary/Chest: Effort normal and breath sounds normal. No respiratory distress.  Abdominal: Soft. She exhibits no distension. There is no tenderness.  Musculoskeletal: Normal range of motion. She exhibits no edema or deformity.  Neurological: She is alert and oriented to person, place, and time.  Skin: Skin is warm and dry.  Psychiatric: She has a normal mood and affect.  Nursing note and vitals reviewed.    ED Treatments / Results  Labs (all labs ordered are listed, but only abnormal results are displayed) Labs Reviewed  TROPONIN I - Abnormal; Notable for the following:       Result Value   Troponin I 0.11 (*)    All other components within normal limits  COMPREHENSIVE METABOLIC PANEL - Abnormal; Notable for the following:    Sodium 132 (*)    Chloride 95 (*)    BUN 5 (*)    Albumin 3.4 (*)    ALT 12 (*)    GFR calc non Af Amer 59 (*)  All other components within normal limits  TROPONIN I - Abnormal; Notable for the following:    Troponin I 0.11 (*)    All other components within normal limits  TROPONIN I - Abnormal; Notable for the following:    Troponin I 0.08 (*)    All other components within normal limits  BASIC METABOLIC PANEL - Abnormal; Notable for the following:    Sodium 134 (*)    Chloride 95 (*)    All other components within normal limits  LIPID PANEL - Abnormal; Notable for the following:    HDL 37 (*)    LDL Cholesterol 124 (*)    All other components within normal limits  I-STAT TROPOININ, ED - Abnormal; Notable for the following:    Troponin i, poc 0.11 (*)     All other components within normal limits  I-STAT TROPOININ, ED - Abnormal; Notable for the following:    Troponin i, poc 0.10 (*)    All other components within normal limits  CBC WITH DIFFERENTIAL/PLATELET  LIPASE, BLOOD  HEPARIN LEVEL (UNFRACTIONATED)  HEPARIN LEVEL (UNFRACTIONATED)  CBC  TROPONIN I  TROPONIN I  TROPONIN I  TSH  I-STAT TROPOININ, ED    EKG  EKG Interpretation  Date/Time:  Tuesday August 28 2016 12:35:57 EDT Ventricular Rate:  67 PR Interval:    QRS Duration: 99 QT Interval:  412 QTC Calculation: 435 R Axis:   4 Text Interpretation:  Sinus rhythm Nonspecific T abnormalities, inferior leads Baseline wander in lead(s) I II aVR aVL TWI in inferior and septal leads Confirmed by [facility] MD, Barbara Cower ([id number]) on 08/29/2016 9:17:59 AM       Radiology No results found.  Procedures Procedures (including critical care time)  CRITICAL CARE Performed by: Marily Memos Total critical care time: 35 minutes Critical care time was exclusive of separately billable procedures and treating other patients. Critical care was necessary to treat or prevent imminent or life-threatening deterioration. Critical care was time spent personally by me on the following activities: development of treatment plan with patient and/or surrogate as well as nursing, discussions with consultants, evaluation of patient's response to treatment, examination of patient, obtaining history from patient or surrogate, ordering and performing treatments and interventions, ordering and review of laboratory studies, ordering and review of radiographic studies, pulse oximetry and re-evaluation of patient's condition.   Medications Ordered in ED Medications - No data to display   Initial Impression / Assessment and Plan / ED Course  I have reviewed the triage vital signs and the nursing notes.  Pertinent labs & imaging results that were available during my care of the patient were reviewed by me  and considered in my medical decision making (see chart for details).  Clinical Course   82 yo F w/ intermittent severe CP from epigastric to right neck multiple times over the weekend associated with nausea, improved slightly with GERD meds but not completely. Had episodes of palpitations at night during the weekend as well.  Patient relates to it to gi. However it is worse than normal and with palpitations (could be arrhythmia 2/2 ischemia) which are new and the ecg's at PCP and here with new TWI's. Positive troponin. I consulted crdiology for heparin/cardiology consult for admission.     Final Clinical Impressions(s) / ED Diagnoses   Final diagnoses:  Abnormal EKG  Elevated troponin  NSTEMI (non-ST elevated myocardial infarction) [facility])    New Prescriptions New Prescriptions   No medications on file     [place]  Deanndra Kirley, MD 08/29/16 [phone]

## 2016-08-28 NOTE — ED Triage Notes (Signed)
[facility] x[facility]-4 days. While attempting to have a bowel movement patient began having palpitations, because of that PCP obtained an EKG showing inverted t waves, sending patient to ED for further evaluation. Pt denies chest pain, shortness of breath, etc. A/o x4.

## 2016-08-28 NOTE — ED Notes (Signed)
Lab notified that this RN unsuccessful at obtaining labs. Phlebotomy to come obtain labs from patient.

## 2016-08-29 ENCOUNTER — Encounter (HOSPITAL_COMMUNITY): Payer: Self-pay | Admitting: General Practice

## 2016-08-29 ENCOUNTER — Other Ambulatory Visit (HOSPITAL_COMMUNITY): Payer: Medicare Other

## 2016-08-29 DIAGNOSIS — E039 Hypothyroidism, unspecified: Secondary | ICD-10-CM | POA: Diagnosis present

## 2016-08-29 DIAGNOSIS — R778 Other specified abnormalities of plasma proteins: Secondary | ICD-10-CM

## 2016-08-29 DIAGNOSIS — R9431 Abnormal electrocardiogram [ECG] [EKG]: Secondary | ICD-10-CM

## 2016-08-29 DIAGNOSIS — I10 Essential (primary) hypertension: Secondary | ICD-10-CM | POA: Diagnosis present

## 2016-08-29 DIAGNOSIS — R7989 Other specified abnormal findings of blood chemistry: Secondary | ICD-10-CM

## 2016-08-29 LAB — BASIC METABOLIC PANEL
ANION GAP: 9 (ref 5–15)
BUN: 6 mg/dL (ref 6–20)
CALCIUM: 9.7 mg/dL (ref 8.9–10.3)
CO2: 30 mmol/L (ref 22–32)
Chloride: 95 mmol/L — ABNORMAL LOW (ref 101–111)
Creatinine, Ser: 0.82 mg/dL (ref 0.44–1.00)
GFR calc Af Amer: >60 mL/min (ref >60)
Glucose, Bld: 93 mg/dL (ref 65–99)
POTASSIUM: 4.1 mmol/L (ref 3.5–5.1)
SODIUM: 134 mmol/L — AB (ref 135–145)

## 2016-08-29 LAB — TROPONIN I
TROPONIN I: 0.08 ng/mL — AB (ref <0.03)
TROPONIN I: 0.05 ng/mL — AB (ref <0.03)
TROPONIN I: 0.04 ng/mL — AB (ref <0.03)
TROPONIN I: 0.03 ng/mL — AB (ref <0.03)

## 2016-08-29 LAB — TSH: TSH: 1.493 u[IU]/mL (ref 0.350–4.500)

## 2016-08-29 LAB — CBC
HEMATOCRIT: 40.2 % (ref 36.0–46.0)
HEMOGLOBIN: 12.7 g/dL (ref 12.0–15.0)
MCH: 28.2 pg (ref 26.0–34.0)
MCHC: 31.6 g/dL (ref 30.0–36.0)
MCV: 89.3 fL (ref 78.0–100.0)
Platelets: 279 10*3/uL (ref 150–400)
RBC: 4.5 MIL/uL (ref 3.87–5.11)
RDW: 14.9 % (ref 11.5–15.5)
WBC: 8.3 10*3/uL (ref 4.0–10.5)

## 2016-08-29 LAB — LIPID PANEL
CHOL/HDL RATIO: 4.9 ratio
CHOLESTEROL: 181 mg/dL (ref 0–200)
HDL: 37 mg/dL — ABNORMAL LOW (ref >40)
LDL Cholesterol: 124 mg/dL — ABNORMAL HIGH (ref 0–99)
Triglycerides: 98 mg/dL (ref <150)
VLDL: 20 mg/dL (ref 0–40)

## 2016-08-29 LAB — HEPARIN LEVEL (UNFRACTIONATED)
HEPARIN UNFRACTIONATED: 0.57 [IU]/mL (ref 0.30–0.70)
Heparin Unfractionated: 0.63 IU/mL (ref 0.30–0.70)

## 2016-08-29 MED ORDER — ENOXAPARIN SODIUM 40 MG/0.4ML ~~LOC~~ SOLN: 40.0000 mg | SUBCUTANEOUS | Status: DC

## 2016-08-29 NOTE — Progress Notes (Signed)
ANTICOAGULATION CONSULT NOTE - Follow Up Consult  Pharmacy Consult for Heparin for 48 hours Indication: chest pain/ACS  Allergies  Allergen Reactions  . Crestor [Rosuvastatin Calcium] Other (See Comments)    Muscle Aches  . Keflex [Cephalexin] Other (See Comments)    dizziness  . Sulfa Antibiotics Hives    Patient Measurements: Height: 5\' 2"  (157.5 cm) Weight: 179 lb (81.2 kg) IBW/kg (Calculated) : 50.1  Vital Signs: Temp: 97.9 F (36.6 C) (09/26 2121) Temp Source: Oral (09/26 2121) BP: 182/84 (09/26 2121) Pulse Rate: 77 (09/26 2121)  Labs:  Recent Labs  08/28/16 1346 08/28/16 2139 08/28/16 2347  HGB 13.6  --   --   HCT 42.5  --   --   PLT 296  --   --   HEPARINUNFRC  --   --  0.63  CREATININE 0.84  --   --   TROPONINI 0.11* 0.11*  --     Estimated Creatinine Clearance: 43.9 mL/min (by C-G formula based on SCr of 0.84 mg/dL).    Assessment: Heparin level therapeutic x 1, medical management only3  Goal of Therapy:  Heparin level 0.3-0.7 units/ml Monitor platelets by anticoagulation protocol: Yes   Plan:  -Cont heparin at 800 units/hr -Confirmatory HL with AM labs  Tammy Fernandez, Tammy Fernandez 08/29/2016,1:06 AM

## 2016-08-29 NOTE — Progress Notes (Signed)
ANTICOAGULATION CONSULT NOTE - Follow Up Consult  Pharmacy Consult for Heparin for 48 hours Indication: chest pain/ACS  Allergies  Allergen Reactions  . Crestor [Rosuvastatin Calcium] Other (See Comments)    Muscle Aches  . Keflex [Cephalexin] Other (See Comments)    dizziness  . Sulfa Antibiotics Hives    Patient Measurements: Height: 5\' 2"  (157.5 cm) Weight: 179 lb (81.2 kg) IBW/kg (Calculated) : 50.1  Vital Signs: Temp: 97.7 F (36.5 C) (09/27 0504) Temp Source: Oral (09/27 0504) BP: 142/57 (09/27 0504) Pulse Rate: 71 (09/27 0504)  Labs:  Recent Labs  08/28/16 1346 08/28/16 2139 08/28/16 2347 08/29/16 0434  HGB 13.6  --   --  12.7  HCT 42.5  --   --  40.2  PLT 296  --   --  279  HEPARINUNFRC  --   --  0.63 0.57  CREATININE 0.84  --   --  0.82  TROPONINI 0.11* 0.11*  --  0.08*    Estimated Creatinine Clearance: 45 mL/min (by C-G formula based on SCr of 0.82 mg/dL).  Assessment: 82 yo female with NSTEMI on heparin. Patient is noted for conservative approach and wil be on heparin for 48hours -Heparin level = 0.57 and at goal   Goal of Therapy:  Heparin level 0.3-0.7 units/ml Monitor platelets by anticoagulation protocol: Yes   Plan:  -Cont heparin at 800 units/hr for 48hrs (start time was 9/26 at ~ 4:15pm) -Daily heparin level and CBC  Harland GermanAndrew Danielle Lento, Pharm D 08/29/2016 8:29 AM

## 2016-08-29 NOTE — Progress Notes (Signed)
During morning assessment, noticed Infiltrated IV, difficult stick, consulted IV team, unable to stick, Patient does not want a line. MD paged to make aware. Will continue to monitor.

## 2016-08-29 NOTE — Progress Notes (Signed)
PROGRESS NOTE  Subjective:   82 yo with hx of HTN, hypothyroidism  Admitted with constipation.  found to have ECG changes and very minimal troponin elevations  Troponins have been flat - not c/w an ACS. She denies any CP or dyspnea Echo for today Troponin level is being drawn now   Objective:    Vital Signs:   Temp:  [97.4 F (36.3 C)-97.9 F (36.6 C)] 97.7 F (36.5 C) (09/27 0504) Pulse Rate:  [66-80] 71 (09/27 0504) Resp:  [13-23] 15 (09/27 0504) BP: (126-182)/(57-84) 142/57 (09/27 0504) SpO2:  [92 %-97 %] 94 % (09/26 2000) Weight:  [179 lb (81.2 kg)] 179 lb (81.2 kg) (09/27 0504)  Last BM Date: 08/26/16   24-hour weight change: Weight change:   Weight trends: Filed Weights   08/28/16 1500 08/28/16 2121 08/29/16 0504  Weight: 179 lb (81.2 kg) 179 lb (81.2 kg) 179 lb (81.2 kg)    Intake/Output:  09/26 0701 - 09/27 0700 In: 1214 [P.O.:120; I.V.:94; IV Piggyback:1000] Out: -  Total I/O In: 120 [P.O.:120] Out: -    Physical Exam: BP (!) 142/57 (BP Location: Left Arm)   Pulse 71   Temp 97.7 F (36.5 C) (Oral)   Resp 15   Ht 5\' 2"  (1.575 m)   Wt 179 lb (81.2 kg)   SpO2 94%   BMI 32.74 kg/m   Wt Readings from Last 3 Encounters:  08/29/16 179 lb (81.2 kg)    General: Vital signs reviewed and noted.   Head: Normocephalic, atraumatic.  Eyes: conjunctivae/corneas clear.  EOM's intact.   Throat: normal  Neck:  normal   Lungs:    clear  Heart:  RR  Abdomen:  Soft, non-tender, non-distended    Extremities: No edema    Neurologic: A&O X3, CN II - XII are grossly intact.   Psych: Normal     Labs: BMET:  Recent Labs  08/28/16 1346 08/29/16 0434  NA 132* 134*  K 4.2 4.1  CL 95* 95*  CO2 29 30  GLUCOSE 95 93  BUN 5* 6  CREATININE 0.84 0.82  CALCIUM 9.8 9.7    Liver function tests:  Recent Labs  08/28/16 1346  AST 22  ALT 12*  ALKPHOS 81  BILITOT 0.5  PROT 7.3  ALBUMIN 3.4*    Recent Labs  08/28/16 1346  LIPASE 23     CBC:  Recent Labs  08/28/16 1346 08/29/16 0434  WBC 8.3 8.3  NEUTROABS 4.3  --   HGB 13.6 12.7  HCT 42.5 40.2  MCV 89.7 89.3  PLT 296 279    Cardiac Enzymes:  Recent Labs  08/28/16 1346 08/28/16 2139 08/29/16 0434  TROPONINI 0.11* 0.11* 0.08*    Coagulation Studies: No results for input(s): LABPROT, INR in the last 72 hours.  Other: Invalid input(s): POCBNP No results for input(s): DDIMER in the last 72 hours. No results for input(s): HGBA1C in the last 72 hours.  Recent Labs  08/29/16 0434  CHOL 181  HDL 37*  LDLCALC 124*  TRIG 98  CHOLHDL 4.9   No results for input(s): TSH, T4TOTAL, T3FREE, THYROIDAB in the last 72 hours.  Invalid input(s): FREET3 No results for input(s): VITAMINB12, FOLATE, FERRITIN, TIBC, IRON, RETICCTPCT in the last 72 hours.   Other results:  EKG  ( personally reviewed )  -  NSR , TWI   No changes from yesterday   Medications:    Infusions: . heparin Stopped (08/29/16 0850)  Scheduled Medications: . ALPRAZolam  0.5 mg Oral BID  . aspirin EC  81 mg Oral Daily  . famotidine  40 mg Oral QHS  . fluticasone  2 spray Each Nare Daily  . levothyroxine  50 mcg Oral QAC breakfast  . lisinopril  10 mg Oral Daily  . loratadine  10 mg Oral Daily  . pantoprazole  40 mg Oral Daily  . propranolol ER  60 mg Oral Daily  . simvastatin  40 mg Oral q1800    Assessment/ Plan:   Principal Problem:   NSTEMI (non-ST elevated myocardial infarction) [facility](HCC) Active Problems:   Essential hypertension   Hypothyroidism   Abnormal EKG   Elevated troponin  1. Abnormal ECG :   No changes from yesterday's ecg. Troponin levels are flat - not c/w ACS. Echo to be done later today  Home tomorrow unless echo shows signficant issues.  2. Elevated troponin levels:   The trend is flat. Not c/w ACS She is a poor candidate for invasive procedures.   I do not anticipate doing further ischemia work up.  I have discussed with family and they agree  with conservative therapy .        Disposition:  Length of Stay: 1  Vesta MixerPhilip J. Lilee Aldea, Montez HagemanJr., MD, [facility] 08/29/2016, 10:14 AM Office [phone] Pager [phone]

## 2016-08-29 NOTE — Progress Notes (Addendum)
PROGRESS NOTE  Tammy FilbertRosa Fernandez ZOX:[id number] DOB: 1924-02-13 DOA: 08/28/2016 PCP: No primary care provider on file.  Brief History:  82 year old female with a history of hypertension, hypothyroidism, and anxiety presented with chest pain and epigastric pain that developed intermittently over the weekend of 08/25/2016. The patient denied any chest pain or epigastric pain, but the patient's family stated that she had been complaining of intermittent chest pain radiating to the right side of her neck with associated nausea. Apparently there was some improvement after administration of GERD medications. The patient also had associated palpitations. The patient went to see her primary care provider where an EKG was performed and showed new T-wave inversions. The patient was sent to the emergency department where she was noted to have mildly elevated troponin with a peak of 0.11. Cardiology was consulted.  Assessment/Plan: Abnormal EKG with elevated troponin -Continue heparin drip per cardiology -The patient is not a good surgical candidate, and family wishes to avoid any invasive procedures -Continue aspirin -Continue beta blocker -Echocardiogram -08/29/2016 EKG--personally reviewed--sinus rhythm with T-wave inversion, V1-V3, III, aVF -08/27/2013 Myoview negative, EF 75%  Hypertension -Continue lisinopril and beta blocker  Hypothyroidism -Continue levothyroxine -Check TSH  Anxiety -Continue alprazolam twice a day  GERD -continue H2 blockade  Deconditioning -PT evaluation once cleared by cardiology     Disposition Plan:   Home when cleared by cardiology Family Communication:   Daughter updated at bedside 9/27--Total time spent 35 minutes.  Greater than 50% spent face to face counseling and coordinating care.--[id number] to [id number]   Consultants:  Cardiology  Code Status:  FULL  DVT Prophylaxis:  [place] Heparin / [place] Lovenox   Procedures: As Listed in Progress Note  Above  Antibiotics: None    Subjective: Patient denies fevers, chills, headache, chest pain, dyspnea, nausea, vomiting, diarrhea, abdominal pain, dysuria, hematuria, hematochezia, and melena.   Objective: Vitals:   08/28/16 1943 08/28/16 2000 08/28/16 2121 08/29/16 0504  BP: 167/62 154/62 (!) 182/84 (!) 142/57  Pulse: 80 70 77 71  Resp: 23 19 17 15   Temp:   97.9 F (36.6 C) 97.7 F (36.5 C)  TempSrc:   Oral Oral  SpO2: 92% 94%    Weight:   81.2 kg (179 lb) 81.2 kg (179 lb)  Height:   5\' 2"  (1.575 m)     Intake/Output Summary (Last 24 hours) at 08/29/16 0845 Last data filed at 08/29/16 0400  Gross per 24 hour  Intake             1214 ml  Output                0 ml  Net             1214 ml   Weight change:  Exam:   General:  Pt is alert, follows commands appropriately, not in acute distress  HEENT: No icterus, No thrush, No neck mass, Thermal/AT  Cardiovascular: RRR, S1/S2, no rubs, no gallops  Respiratory: CTA bilaterally, no wheezing, no crackles, no rhonchi  Abdomen: Soft/+BS, non tender, non distended, no guarding  Extremities: No edema, No lymphangitis, No petechiae, No rashes, no synovitis   Data Reviewed: I have personally reviewed following labs and imaging studies Basic Metabolic Panel:  Recent Labs Lab 08/28/16 1346 08/29/16 0434  NA 132* 134*  K 4.2 4.1  CL 95* 95*  CO2 29 30  GLUCOSE 95 93  BUN 5* 6  CREATININE 0.84 0.82  CALCIUM  9.8 9.7   Liver Function Tests:  Recent Labs Lab 08/28/16 1346  AST 22  ALT 12*  ALKPHOS 81  BILITOT 0.5  PROT 7.3  ALBUMIN 3.4*    Recent Labs Lab 08/28/16 1346  LIPASE 23   No results for input(s): AMMONIA in the last 168 hours. Coagulation Profile: No results for input(s): INR, PROTIME in the last 168 hours. CBC:  Recent Labs Lab 08/28/16 1346 08/29/16 0434  WBC 8.3 8.3  NEUTROABS 4.3  --   HGB 13.6 12.7  HCT 42.5 40.2  MCV 89.7 89.3  PLT 296 279   Cardiac Enzymes:  Recent  Labs Lab 08/28/16 1346 08/28/16 2139 08/29/16 0434  TROPONINI 0.11* 0.11* 0.08*   BNP: Invalid input(s): POCBNP CBG: No results for input(s): GLUCAP in the last 168 hours. HbA1C: No results for input(s): HGBA1C in the last 72 hours. Urine analysis: No results found for: COLORURINE, APPEARANCEUR, LABSPEC, PHURINE, GLUCOSEU, HGBUR, BILIRUBINUR, KETONESUR, PROTEINUR, UROBILINOGEN, NITRITE, LEUKOCYTESUR Sepsis Labs: @LABRCNTIP (procalcitonin:4,lacticidven:4) )No results found for this or any previous visit (from the past 240 hour(s)).   Scheduled Meds: . ALPRAZolam  0.5 mg Oral BID  . aspirin EC  81 mg Oral Daily  . famotidine  40 mg Oral QHS  . fluticasone  2 spray Each Nare Daily  . levothyroxine  50 mcg Oral QAC breakfast  . lisinopril  10 mg Oral Daily  . loratadine  10 mg Oral Daily  . pantoprazole  40 mg Oral Daily  . propranolol ER  60 mg Oral Daily  . simvastatin  40 mg Oral q1800   Continuous Infusions: . heparin 800 Units/hr (08/28/16 2130)    Procedures/Studies: Dg Chest 2 View  Result Date: 08/28/2016 CLINICAL DATA:  Constipation for 3 or 4 days. Palpitations with attempted bowel movement. EXAM: CHEST  2 VIEW COMPARISON:  None. FINDINGS: There is elevation of the right hemidiaphragm obscuring the right hilum and right heart border. This results in poor aeration of the right lung. No pneumothorax. Within visualize limits, the heart, hila, and mediastinum are normal. No pulmonary nodules or masses. No focal infiltrates. Mild anterior wedging of a lower thoracic vertebral body is age indeterminate but may not be acute. IMPRESSION: 1. Mild anterior wedging of a lower thoracic vertebral body, age indeterminate. This may not be acute. 2. Significant elevation of the right hemidiaphragm. Electronically Signed   By: Gerome Sam III M.D   On: 08/28/2016 14:18    Laterica Matarazzo, DO  [facility] Pager [phone]  If 7PM-7AM, please contact  night-coverage [email or web address] Password [email or web address] 08/29/2016, 8:45 AM   LOS: 1 day

## 2016-08-30 ENCOUNTER — Inpatient Hospital Stay (HOSPITAL_COMMUNITY): Payer: Medicare Other

## 2016-08-30 ENCOUNTER — Encounter (HOSPITAL_COMMUNITY): Payer: Self-pay | Admitting: Emergency Medicine

## 2016-08-30 DIAGNOSIS — R9431 Abnormal electrocardiogram [ECG] [EKG]: Secondary | ICD-10-CM

## 2016-08-30 DIAGNOSIS — E038 Other specified hypothyroidism: Secondary | ICD-10-CM

## 2016-08-30 DIAGNOSIS — I2510 Atherosclerotic heart disease of native coronary artery without angina pectoris: Secondary | ICD-10-CM

## 2016-08-30 LAB — CBC
HCT: 39.7 % (ref 36.0–46.0)
HEMOGLOBIN: 12.5 g/dL (ref 12.0–15.0)
MCH: 28 pg (ref 26.0–34.0)
MCHC: 31.5 g/dL (ref 30.0–36.0)
MCV: 89 fL (ref 78.0–100.0)
Platelets: 273 10*3/uL (ref 150–400)
RBC: 4.46 MIL/uL (ref 3.87–5.11)
RDW: 14.8 % (ref 11.5–15.5)
WBC: 7.4 10*3/uL (ref 4.0–10.5)

## 2016-08-30 LAB — TROPONIN I
TROPONIN I: 0.04 ng/mL — AB (ref <0.03)
Troponin I: 0.03 ng/mL (ref <0.03)

## 2016-08-30 LAB — ECHOCARDIOGRAM COMPLETE
Height: 62 in
Weight: 2864 oz

## 2016-08-30 MED ORDER — PRAVASTATIN SODIUM 40 MG PO TABS: 40.0000 mg | ORAL_TABLET | Freq: Every day | ORAL | Status: DC

## 2016-08-30 MED ORDER — LISINOPRIL 10 MG PO TABS: 10.0000 mg | ORAL_TABLET | Freq: Every day | ORAL | 1 refills | Status: DC

## 2016-08-30 MED ORDER — PRAVASTATIN SODIUM 40 MG PO TABS: 40.0000 mg | ORAL_TABLET | Freq: Every day | ORAL | 1 refills | Status: DC

## 2016-08-30 NOTE — Discharge Summary (Signed)
Physician Discharge Summary  Tammy Fernandez ZOX:[id number] DOB: 04/08/1924 DOA: 08/28/2016  PCP: Tammy Fernandez,Tammy ALEXANDER, MD  Admit date: 08/28/2016 Discharge date: 08/30/2016  Admitted From: Home Disposition:  Home  Recommendations for Outpatient Follow-up:  1. Follow up with PCP in 1-2 weeks 2. Please obtain BMP/CBC in one week   Home Health: PT/RN/Aide Equipment/Devices:none  Discharge Condition:stable CODE STATUS:FULL Diet recommendation: Heart Healthy  Brief/Interim Summary: 82 year old female with a history of hypertension, hypothyroidism, and anxiety presented with chest pain and epigastric pain that developed intermittently over the weekend of 08/25/2016. The patient denied any chest pain or epigastric pain, but the patient's family stated that she had been complaining of intermittent chest pain radiating to the right side of her neck with associated nausea. Apparently there was some improvement after administration of GERD medications. The patient also had associated palpitations. The patient went to see her primary care provider where an EKG was performed and showed new T-wave inversions. The patient was sent to the emergency department where she was noted to have mildly elevated troponin with a peak of 0.11. Cardiology was consulted.  Discharge Diagnoses:  Abnormal EKG with elevated troponin -Continue heparin drip per cardiology--> this was discontinued on 08/29/2016-->not ACS -The patient is not a good surgical candidate, and family wishes to avoid any invasive procedures -Continue aspirin -Continue beta blocker -troponin trend flat -Echocardiogram--EF 55-60%, no WMA, moderately reduced RV function, PAP 55 -08/29/2016 EKG--personally reviewed--sinus rhythm with T-wave inversion, V1-V3, III, aVF -08/27/2013 Myoview negative, EF 75% -As the patient did not have any further chest pain and remained clinically stable with no wall motion abnormalities on echocardiogram, the  patient was discharged with outpatient cardiology follow-up.  Hypertension -Continue lisinopril and beta blocker  Hypothyroidism -Continue levothyroxine -Check TSH--1.493  Anxiety -Continue alprazolam twice a day  GERD -continue H2 blockade  Deconditioning -PT evaluation once cleared by cardiology  Hyperlipidemia -The patient has intolerance to Crestor and simvastatin. After discussion with the patient and family, she was willing to try pravastatin   Discharge Instructions  Discharge Instructions    Diet - low sodium heart healthy    Complete by:  As directed    Increase activity slowly    Complete by:  As directed        Medication List    TAKE these medications   ALPRAZolam 1 MG tablet Commonly known as:  XANAX Take 0.5 mg by mouth 2 (two) times daily.   aspirin EC 81 MG tablet Take 81 mg by mouth daily.   diphenhydrAMINE 25 MG tablet Commonly known as:  BENADRYL Take 25 mg by mouth every 6 (six) hours as needed for itching or allergies.   fluticasone 50 MCG/ACT nasal spray Commonly known as:  FLONASE Place 2 sprays into both nostrils daily.   levothyroxine 50 MCG tablet Commonly known as:  SYNTHROID, LEVOTHROID Take 50 mcg by mouth daily before breakfast.   lisinopril 10 MG tablet Commonly known as:  PRINIVIL,ZESTRIL Take 1 tablet (10 mg total) by mouth daily. What changed:  medication strength   loratadine 10 MG tablet Commonly known as:  CLARITIN Take 10 mg by mouth daily.   meclizine 12.5 MG tablet Commonly known as:  ANTIVERT Take 12.5 mg by mouth every 6 (six) hours as needed for dizziness.   olopatadine 0.1 % ophthalmic solution Commonly known as:  PATANOL Place 1 drop into both eyes daily as needed for allergies.   omeprazole 20 MG capsule Commonly known as:  PRILOSEC Take 20 mg by mouth  every morning.   pravastatin 40 MG tablet Commonly known as:  PRAVACHOL Take 1 tablet (40 mg total) by mouth daily at 6 PM.   propranolol  ER 60 MG 24 hr capsule Commonly known as:  INDERAL LA Take 60 mg by mouth daily.   ranitidine 300 MG tablet Commonly known as:  ZANTAC Take 300 mg by mouth every evening.      Follow-up Information    [name], MD Follow up in 2 week(s).   Specialty:  Cardiology Contact information: [address] [place] [address] [phone]          Allergies  Allergen Reactions  . Apple Flavor Hives    No apple sauce  . Crestor [Rosuvastatin Calcium] Other (See Comments)    Muscle Aches  . Keflex [Cephalexin] Other (See Comments)    dizziness  . Peanut Butter Flavor Hives  . Sulfa Antibiotics Hives  . Cephalexin Other (See Comments)    dizziness  . Crestor [Rosuvastatin Calcium]   . Sulfa Drugs Cross Reactors Rash    Consultations:  cardiology   Procedures/Studies: Dg Chest 2 View  Result Date: 08/28/2016 CLINICAL DATA:  Constipation for 3 or 4 days. Palpitations with attempted bowel movement. EXAM: CHEST  2 VIEW COMPARISON:  None. FINDINGS: There is elevation of the right hemidiaphragm obscuring the right hilum and right heart border. This results in poor aeration of the right lung. No pneumothorax. Within visualize limits, the heart, hila, and mediastinum are normal. No pulmonary nodules or masses. No focal infiltrates. Mild anterior wedging of a lower thoracic vertebral body is age indeterminate but may not be acute. IMPRESSION: 1. Mild anterior wedging of a lower thoracic vertebral body, age indeterminate. This may not be acute. 2. Significant elevation of the right hemidiaphragm. Electronically Signed   By: Gerome Sam III M.D   On: 08/28/2016 14:18         Discharge Exam: Vitals:   08/30/16 0535 08/30/16 1200  BP: (!) 139/55 (!) 157/65  Pulse: 65 73  Resp: 18   Temp: 98.1 F (36.7 C) 98.1 F (36.7 C)   Vitals:   08/29/16 1411 08/29/16 1935 08/30/16 0535 08/30/16 1200  BP: 120/60 (!) 159/69 (!) 139/55 (!) 157/65  Pulse: 63 67 65  73  Resp: 17 (!) 23 18   Temp: 98 F (36.7 C) 98 F (36.7 C) 98.1 F (36.7 C) 98.1 F (36.7 C)  TempSrc: Oral Oral Oral Oral  SpO2: 93% 92% 93% 100%  Weight:      Height:        General: Pt is alert, awake, not in acute distress Cardiovascular: RRR, S1/S2 +, no rubs, no gallops Respiratory: CTA bilaterally, no wheezing, no rhonchi Abdominal: Soft, NT, ND, bowel sounds + Extremities: no edema, no cyanosis   The results of significant diagnostics from this hospitalization (including imaging, microbiology, ancillary and laboratory) are listed below for reference.    Significant Diagnostic Studies: Dg Chest 2 View  Result Date: 08/28/2016 CLINICAL DATA:  Constipation for 3 or 4 days. Palpitations with attempted bowel movement. EXAM: CHEST  2 VIEW COMPARISON:  None. FINDINGS: There is elevation of the right hemidiaphragm obscuring the right hilum and right heart border. This results in poor aeration of the right lung. No pneumothorax. Within visualize limits, the heart, hila, and mediastinum are normal. No pulmonary nodules or masses. No focal infiltrates. Mild anterior wedging of a lower thoracic vertebral body is age indeterminate but may not be acute.  IMPRESSION: 1. Mild anterior wedging of a lower thoracic vertebral body, age indeterminate. This may not be acute. 2. Significant elevation of the right hemidiaphragm. Electronically Signed   By: Gerome Sam III M.D   On: 08/28/2016 14:18     Microbiology: No results found for this or any previous visit (from the past 240 hour(s)).   Labs: Basic Metabolic Panel:  Recent Labs Lab 08/28/16 1346 08/29/16 0434  NA 132* 134*  K 4.2 4.1  CL 95* 95*  CO2 29 30  GLUCOSE 95 93  BUN 5* 6  CREATININE 0.84 0.82  CALCIUM 9.8 9.7   Liver Function Tests:  Recent Labs Lab 08/28/16 1346  AST 22  ALT 12*  ALKPHOS 81  BILITOT 0.5  PROT 7.3  ALBUMIN 3.4*    Recent Labs Lab 08/28/16 1346  LIPASE 23   No results for  input(s): AMMONIA in the last 168 hours. CBC:  Recent Labs Lab 08/28/16 1346 08/29/16 0434 08/30/16 0304  WBC 8.3 8.3 7.4  NEUTROABS 4.3  --   --   HGB 13.6 12.7 12.5  HCT 42.5 40.2 39.7  MCV 89.7 89.3 89.0  PLT 296 279 273   Cardiac Enzymes:  Recent Labs Lab 08/29/16 1022 08/29/16 1506 08/29/16 2152 08/30/16 0304 08/30/16 0913  TROPONINI 0.05* 0.04* 0.03* 0.03* 0.04*   BNP: Invalid input(s): POCBNP CBG: No results for input(s): GLUCAP in the last 168 hours.  Time coordinating discharge:  Greater than 30 minutes  Signed:  Harrold Fitchett, DO [facility] Pager: [phone] 08/30/2016, 6:22 PM

## 2016-08-30 NOTE — Progress Notes (Signed)
PROGRESS NOTE  Subjective:   82 yo with hx of HTN, hypothyroidism  Admitted with constipation.  found to have ECG changes and very minimal troponin elevations  Troponins have been flat - not c/w an ACS. She denies any CP or dyspnea Echo for today     Objective:    Vital Signs:   Temp:  [98 F (36.7 C)-98.1 F (36.7 C)] 98.1 F (36.7 C) (09/28 0535) Pulse Rate:  [63-67] 65 (09/28 0535) Resp:  [17-23] 18 (09/28 0535) BP: (120-159)/(55-69) 139/55 (09/28 0535) SpO2:  [92 %-93 %] 93 % (09/28 0535)  Last BM Date: 08/26/16   24-hour weight change: Weight change:   Weight trends: Filed Weights   08/28/16 1500 08/28/16 2121 08/29/16 0504  Weight: 179 lb (81.2 kg) 179 lb (81.2 kg) 179 lb (81.2 kg)    Intake/Output:  09/27 0701 - 09/28 0700 In: 480 [P.O.:480] Out: -  Total I/O In: -  Out: 1300 [Urine:1300]   Physical Exam: BP (!) 139/55 (BP Location: Right Arm)   Pulse 65   Temp 98.1 F (36.7 C) (Oral)   Resp 18   Ht 5\' 2"  (1.575 m)   Wt 179 lb (81.2 kg)   SpO2 93%   BMI 32.74 kg/m   Wt Readings from Last 3 Encounters:  08/29/16 179 lb (81.2 kg)    General: Vital signs reviewed and noted.   Head: Normocephalic, atraumatic.  Eyes: conjunctivae/corneas clear.  EOM's intact.   Throat: normal  Neck:  normal   Lungs:    clear  Heart:  RR  Abdomen:  Soft, non-tender, non-distended    Extremities: No edema    Neurologic: A&O X3, CN II - XII are grossly intact.   Psych: Normal     Labs: BMET:  Recent Labs  08/28/16 1346 08/29/16 0434  NA 132* 134*  K 4.2 4.1  CL 95* 95*  CO2 29 30  GLUCOSE 95 93  BUN 5* 6  CREATININE 0.84 0.82  CALCIUM 9.8 9.7    Liver function tests:  Recent Labs  08/28/16 1346  AST 22  ALT 12*  ALKPHOS 81  BILITOT 0.5  PROT 7.3  ALBUMIN 3.4*    Recent Labs  08/28/16 1346  LIPASE 23    CBC:  Recent Labs  08/28/16 1346 08/29/16 0434 08/30/16 0304  WBC 8.3 8.3 7.4  NEUTROABS 4.3  --   --     HGB 13.6 12.7 12.5  HCT 42.5 40.2 39.7  MCV 89.7 89.3 89.0  PLT 296 279 273    Cardiac Enzymes:  Recent Labs  08/29/16 1022 08/29/16 1506 08/29/16 2152 08/30/16 0304  TROPONINI 0.05* 0.04* 0.03* 0.03*    Coagulation Studies: No results for input(s): LABPROT, INR in the last 72 hours.  Other: Invalid input(s): POCBNP No results for input(s): DDIMER in the last 72 hours. No results for input(s): HGBA1C in the last 72 hours.  Recent Labs  08/29/16 0434  CHOL 181  HDL 37*  LDLCALC 124*  TRIG 98  CHOLHDL 4.9    Recent Labs  08/29/16 1022  TSH 1.493   No results for input(s): VITAMINB12, FOLATE, FERRITIN, TIBC, IRON, RETICCTPCT in the last 72 hours.   Other results:  EKG  ( personally reviewed )  -  NSR , TWI   No changes from yesterday   Medications:    Infusions:    Scheduled Medications: . ALPRAZolam  0.5 mg Oral BID  . aspirin EC  81  mg Oral Daily  . enoxaparin (LOVENOX) injection  40 mg Subcutaneous Q24H  . famotidine  40 mg Oral QHS  . fluticasone  2 spray Each Nare Daily  . levothyroxine  50 mcg Oral QAC breakfast  . lisinopril  10 mg Oral Daily  . loratadine  10 mg Oral Daily  . pantoprazole  40 mg Oral Daily  . propranolol ER  60 mg Oral Daily  . simvastatin  40 mg Oral q1800    Assessment/ Plan:   Principal Problem:   NSTEMI (non-ST elevated myocardial infarction) [facility](HCC) Active Problems:   Essential hypertension   Hypothyroidism   Abnormal EKG   Elevated troponin  1. Abnormal ECG :   No changes from yesterday's ecg. Troponin levels are flat - not c/w ACS. Echo to be done later today  Home today  unless echo shows signficant issues.   2. Elevated troponin levels:   The trend is flat. Not c/w ACS She is a poor candidate for invasive procedures.   I do not anticipate doing further ischemia work up.  I have discussed with family and they agree with conservative therapy .    3. Mild hyperlipidemia:   She has tried Lipitor and  Crestor in the past and had muscle aches. Currently on Simva Could consider Zetia if she is unable to tolerate the simva     Disposition:  Length of Stay: 2  Vesta MixerPhilip J. Nahser, Montez HagemanJr., MD, [facility] 08/30/2016, 9:41 AM Office [phone] Pager [phone]

## 2016-08-30 NOTE — Progress Notes (Signed)
  Echocardiogram 2D Echocardiogram has been performed.  Arvil ChacoFoster, Myldred Raju 08/30/2016, 11:40 AM

## 2016-08-30 NOTE — Care Management (Signed)
08-30-16 1509 [facility] Pt d/c home and orders placed in [facility] for resumption. [facility] notified that pt was d/c. No further needs at this time.

## 2017-10-02 ENCOUNTER — Encounter (INDEPENDENT_AMBULATORY_CARE_PROVIDER_SITE_OTHER): Payer: Self-pay | Admitting: Orthopedic Surgery

## 2017-10-02 ENCOUNTER — Ambulatory Visit (INDEPENDENT_AMBULATORY_CARE_PROVIDER_SITE_OTHER): Payer: Medicare Other

## 2017-10-02 ENCOUNTER — Ambulatory Visit (INDEPENDENT_AMBULATORY_CARE_PROVIDER_SITE_OTHER): Payer: Medicare Other | Admitting: Orthopedic Surgery

## 2017-10-02 DIAGNOSIS — M5416 Radiculopathy, lumbar region: Secondary | ICD-10-CM

## 2017-10-02 NOTE — Progress Notes (Signed)
Office Visit Note   Patient: Tammy Fernandez           Date of Birth: 04-23-1924           MRN: [id number] Visit Date: 10/02/2017 Requested by: Tammy Fernandez, Robert, MD [phone] [place], [place] [address] PCP: Tammy Fernandez, Robert, MD  Subjective: Chief Complaint  Patient presents with  . Leg Pain    BILATERAL    HPI: Tammy DupreRose is a 83 year old patient with bilateral leg pain left worse than right.  She complains her legs are feeling very stiff and heavy.  She describes years ago having someone tell her that she has a disc problem in her back and she needed surgery.  She did not get that surgery.  Denies any history of injury and states that her pain is worse with activity.  It is painful for her to weight bear.  States that her legs are weak and a give way.  Denies any groin pain but does report radicular leg pain including numbness in the great toe.  She states that she does not want to take any more medication and she does not want any surgery.              ROS: All systems reviewed are negative as they relate to the chief complaint within the history of present illness.  Patient denies  fevers or chills.   Assessment & Plan: Visit Diagnoses:  1. Radiculopathy, lumbar region     Plan: Impression is neurogenic versus vascular claudication and bilateral lower extremities.  No open sores or infections in or around the feet.  Pulses are diminished and radiographs of the back indicate high likelihood of spinal stenosis.  Discussed all this with Tammy Fernandez and her daughter.  Discussed potential further diagnostic studies and subsequent interventions and she is really not interested in any of it.  No indication for further workup at this time.  Follow-up with me as needed  Follow-Up Instructions: No Follow-up on file.   Orders:  Orders Placed This Encounter  Procedures  . XR Lumbar Spine 2-3 Views   No orders of the defined types were placed in this encounter.     Procedures: No  procedures performed   Clinical Data: No additional findings.  Objective: Vital Signs: There were no vitals taken for this visit.  Physical Exam:   Constitutional: Patient appears well-developed HEENT:  Head: Normocephalic Eyes:EOM are normal Neck: Normal range of motion Cardiovascular: Normal rate Pulmonary/chest: Effort normal Neurologic: Patient is alert Skin: Skin is warm Psychiatric: Patient has normal mood and affect    Ortho Exam: Orthopedic exam demonstrates diminished pedal pulses but warm foot bilaterally.  Ankle dorsi flexion plantar flexion quite after strength intact.  No groin pain with internal/external rotation of the leg.  There is a little bit of calf atrophy bilaterally left worse than right.  No effusion in either knee.  Does have some paresthesias in the L5 distribution right leg.  Negative clonus negative Babinski reflexes symmetric bilateral patella and Achilles  Specialty Comments:  No specialty comments available.  Imaging: Xr Lumbar Spine 2-3 Views  Result Date: 10/02/2017 AP lateral lumbar spine reviewed.  There is loss of lordosis on the lateral view with significant facet arthritis and degenerative disc disease noted.  Calcification with the aorta is also present.  No definite compression fracture seen.  Mild left hip degenerative joint disease present.  Coiled metallic structure at the level of the L3 vertebral body is extracorporeal  PMFS History: Patient Active Problem List   Diagnosis Date Noted  . Essential hypertension 08/29/2016  . Hypothyroidism 08/29/2016  . Abnormal EKG   . Elevated troponin   . NSTEMI (non-ST elevated myocardial infarction) (HCC) 08/28/2016  . Decubitus ulcer of sacral region, stage 3 (HCC) 12/18/2015  . Klebsiella cystitis 12/16/2015  . Acute delirium 12/16/2015  . Aspiration pneumonia (HCC) 12/16/2015  . Leukocytosis   . Hypokalemia   . Encounter for central line placement   . Anxiety state   . Acute on  chronic respiratory failure with hypoxia (HCC)   . Essential hypertension   . Physical deconditioning   . Altered mental status   . Hypothyroidism   . Chronic diarrhea   . Hyponatremia   . CAP (community acquired pneumonia) 10/29/2015  . History of CVA (cerebrovascular accident) 01/19/2014  . Dysphagia, post-stroke 01/15/2014  . Bacteremia due to Staphylococcus 01/13/2014  . Acute respiratory failure with hypoxia (HCC) 01/13/2014  . Acute encephalopathy 01/12/2014  . Left hemiplegia (HCC) 01/12/2014  . Hypertension 01/11/2014  . Acute hyponatremia 01/11/2014  . Hyperkalemia 01/11/2014  . Hematuria 01/11/2014  . E. coli UTI (urinary tract infection) 01/11/2014  . GERD (gastroesophageal reflux disease) 11/14/2011  . Obesities, morbid (HCC) 11/14/2011   Past Medical History:  Diagnosis Date  . Abnormal heart rhythms   . Anxiety   . Chronic lower back pain   . Depression   . Fatty liver   . GERD (gastroesophageal reflux disease)   . 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 High cholesterol   . Hyperlipidemia   . Hypertension   . Hypothyroidism   . Internal hemorrhoid   . NSTEMI (non-ST elevated myocardial infarction) (HCC) 08/28/2016  . Pneumonia 2016  . Thyroid disease     Family History  Problem Relation Age of Onset  . Stroke Father   . Hypertension Father   . Colon cancer Cousin   . Colon cancer Unknown        Aunt  . Diabetes Unknown        aunt and cousins  . Heart disease Unknown        cousins    Past Surgical History:  Procedure Laterality Date  . ABDOMINAL HYSTERECTOMY    . CATARACT EXTRACTION W/ INTRAOCULAR LENS  IMPLANT, BILATERAL Bilateral   . CESAREAN SECTION    . TEE WITHOUT CARDIOVERSION N/A 01/19/2014   Procedure: TRANSESOPHAGEAL ECHOCARDIOGRAM (TEE);  Surgeon: Laurey Morale, MD;  Location[facility] ENDOSCOPY;  Service: Cardiovascular;  Laterality: N/A;  [name]  . VAGINAL HYSTERECTOMY     Social History   Occupational History  . Not  on file.   Social History Main Topics  . Smoking status: Never Smoker  . Smokeless tobacco: Former Neurosurgeon    Types: Snuff     Comment: "used snuff when I was 19"  . Alcohol use No  . Drug use: No  . Sexual activity: Not on file

## 2017-12-19 ENCOUNTER — Emergency Department (HOSPITAL_COMMUNITY): Payer: Medicare Other

## 2017-12-19 ENCOUNTER — Ambulatory Visit
Admission: RE | Admit: 2017-12-19 | Discharge: 2017-12-19 | Disposition: A | Discharge status: Home / Self Care | Payer: Medicare Other | Source: Ambulatory Visit | Attending: Physician Assistant | Admitting: Physician Assistant

## 2017-12-19 ENCOUNTER — Other Ambulatory Visit: Payer: Self-pay | Admitting: Physician Assistant

## 2017-12-19 ENCOUNTER — Encounter (HOSPITAL_COMMUNITY): Payer: Self-pay | Admitting: Emergency Medicine

## 2017-12-19 ENCOUNTER — Inpatient Hospital Stay (HOSPITAL_COMMUNITY)
Admission: EM | Admit: 2017-12-19 | Discharge: 2017-12-29 | DRG: 643 | Disposition: A | Discharge status: Home Health | Payer: Medicare Other | Attending: Family Medicine | Admitting: Family Medicine

## 2017-12-19 DIAGNOSIS — I700 Atherosclerosis of aorta: Secondary | ICD-10-CM | POA: Diagnosis present

## 2017-12-19 DIAGNOSIS — I361 Nonrheumatic tricuspid (valve) insufficiency: Secondary | ICD-10-CM | POA: Diagnosis not present

## 2017-12-19 DIAGNOSIS — R062 Wheezing: Secondary | ICD-10-CM

## 2017-12-19 DIAGNOSIS — G9341 Metabolic encephalopathy: Secondary | ICD-10-CM

## 2017-12-19 DIAGNOSIS — K521 Toxic gastroenteritis and colitis: Secondary | ICD-10-CM | POA: Diagnosis present

## 2017-12-19 DIAGNOSIS — J9601 Acute respiratory failure with hypoxia: Secondary | ICD-10-CM | POA: Diagnosis not present

## 2017-12-19 DIAGNOSIS — J9621 Acute and chronic respiratory failure with hypoxia: Secondary | ICD-10-CM | POA: Diagnosis present

## 2017-12-19 DIAGNOSIS — I351 Nonrheumatic aortic (valve) insufficiency: Secondary | ICD-10-CM | POA: Diagnosis not present

## 2017-12-19 DIAGNOSIS — I071 Rheumatic tricuspid insufficiency: Secondary | ICD-10-CM | POA: Diagnosis present

## 2017-12-19 DIAGNOSIS — I110 Hypertensive heart disease with heart failure: Secondary | ICD-10-CM | POA: Diagnosis present

## 2017-12-19 DIAGNOSIS — I2721 Secondary pulmonary arterial hypertension: Secondary | ICD-10-CM | POA: Diagnosis not present

## 2017-12-19 DIAGNOSIS — R1310 Dysphagia, unspecified: Secondary | ICD-10-CM | POA: Diagnosis present

## 2017-12-19 DIAGNOSIS — Z882 Allergy status to sulfonamides status: Secondary | ICD-10-CM

## 2017-12-19 DIAGNOSIS — E7800 Pure hypercholesterolemia, unspecified: Secondary | ICD-10-CM | POA: Diagnosis present

## 2017-12-19 DIAGNOSIS — K760 Fatty (change of) liver, not elsewhere classified: Secondary | ICD-10-CM | POA: Diagnosis present

## 2017-12-19 DIAGNOSIS — I2510 Atherosclerotic heart disease of native coronary artery without angina pectoris: Secondary | ICD-10-CM | POA: Diagnosis present

## 2017-12-19 DIAGNOSIS — Z87891 Personal history of nicotine dependence: Secondary | ICD-10-CM

## 2017-12-19 DIAGNOSIS — E860 Dehydration: Secondary | ICD-10-CM | POA: Diagnosis present

## 2017-12-19 DIAGNOSIS — I5033 Acute on chronic diastolic (congestive) heart failure: Secondary | ICD-10-CM | POA: Diagnosis present

## 2017-12-19 DIAGNOSIS — F419 Anxiety disorder, unspecified: Secondary | ICD-10-CM | POA: Diagnosis present

## 2017-12-19 DIAGNOSIS — E861 Hypovolemia: Secondary | ICD-10-CM | POA: Diagnosis present

## 2017-12-19 DIAGNOSIS — E785 Hyperlipidemia, unspecified: Secondary | ICD-10-CM | POA: Diagnosis present

## 2017-12-19 DIAGNOSIS — K5900 Constipation, unspecified: Secondary | ICD-10-CM | POA: Diagnosis present

## 2017-12-19 DIAGNOSIS — R0902 Hypoxemia: Secondary | ICD-10-CM

## 2017-12-19 DIAGNOSIS — T473X5A Adverse effect of saline and osmotic laxatives, initial encounter: Secondary | ICD-10-CM | POA: Diagnosis present

## 2017-12-19 DIAGNOSIS — Z91018 Allergy to other foods: Secondary | ICD-10-CM

## 2017-12-19 DIAGNOSIS — B370 Candidal stomatitis: Secondary | ICD-10-CM | POA: Diagnosis not present

## 2017-12-19 DIAGNOSIS — I10 Essential (primary) hypertension: Secondary | ICD-10-CM | POA: Diagnosis not present

## 2017-12-19 DIAGNOSIS — F329 Major depressive disorder, single episode, unspecified: Secondary | ICD-10-CM | POA: Diagnosis present

## 2017-12-19 DIAGNOSIS — I5032 Chronic diastolic (congestive) heart failure: Secondary | ICD-10-CM

## 2017-12-19 DIAGNOSIS — Z79899 Other long term (current) drug therapy: Secondary | ICD-10-CM

## 2017-12-19 DIAGNOSIS — Z8701 Personal history of pneumonia (recurrent): Secondary | ICD-10-CM

## 2017-12-19 DIAGNOSIS — E222 Syndrome of inappropriate secretion of antidiuretic hormone: Principal | ICD-10-CM | POA: Diagnosis present

## 2017-12-19 DIAGNOSIS — G9340 Encephalopathy, unspecified: Secondary | ICD-10-CM | POA: Diagnosis not present

## 2017-12-19 DIAGNOSIS — E669 Obesity, unspecified: Secondary | ICD-10-CM | POA: Diagnosis present

## 2017-12-19 DIAGNOSIS — R0609 Other forms of dyspnea: Secondary | ICD-10-CM

## 2017-12-19 DIAGNOSIS — I252 Old myocardial infarction: Secondary | ICD-10-CM | POA: Diagnosis not present

## 2017-12-19 DIAGNOSIS — Z881 Allergy status to other antibiotic agents status: Secondary | ICD-10-CM

## 2017-12-19 DIAGNOSIS — I272 Pulmonary hypertension, unspecified: Secondary | ICD-10-CM | POA: Diagnosis present

## 2017-12-19 DIAGNOSIS — E873 Alkalosis: Secondary | ICD-10-CM | POA: Diagnosis not present

## 2017-12-19 DIAGNOSIS — E871 Hypo-osmolality and hyponatremia: Secondary | ICD-10-CM | POA: Diagnosis present

## 2017-12-19 DIAGNOSIS — E039 Hypothyroidism, unspecified: Secondary | ICD-10-CM | POA: Diagnosis present

## 2017-12-19 DIAGNOSIS — K219 Gastro-esophageal reflux disease without esophagitis: Secondary | ICD-10-CM | POA: Diagnosis present

## 2017-12-19 DIAGNOSIS — Z91010 Allergy to peanuts: Secondary | ICD-10-CM

## 2017-12-19 DIAGNOSIS — J9600 Acute respiratory failure, unspecified whether with hypoxia or hypercapnia: Secondary | ICD-10-CM | POA: Diagnosis not present

## 2017-12-19 DIAGNOSIS — Z888 Allergy status to other drugs, medicaments and biological substances status: Secondary | ICD-10-CM

## 2017-12-19 HISTORY — DX: Secondary pulmonary arterial hypertension: I27.21

## 2017-12-19 LAB — CBC WITH DIFFERENTIAL/PLATELET
Basophils Absolute: 0 10*3/uL (ref 0.0–0.1)
Basophils Relative: 0 %
EOS ABS: 0 10*3/uL (ref 0.0–0.7)
EOS PCT: 0 %
HCT: 41.5 % (ref 36.0–46.0)
Hemoglobin: 14.6 g/dL (ref 12.0–15.0)
LYMPHS ABS: 0.9 10*3/uL (ref 0.7–4.0)
LYMPHS PCT: 14 %
MCH: 30.5 pg (ref 26.0–34.0)
MCHC: 35.2 g/dL (ref 30.0–36.0)
MCV: 86.6 fL (ref 78.0–100.0)
MONOS PCT: 5 %
Monocytes Absolute: 0.3 10*3/uL (ref 0.1–1.0)
Neutro Abs: 5.6 10*3/uL (ref 1.7–7.7)
Neutrophils Relative %: 81 %
PLATELETS: 262 10*3/uL (ref 150–400)
RBC: 4.79 MIL/uL (ref 3.87–5.11)
RDW: 13.2 % (ref 11.5–15.5)
WBC: 6.9 10*3/uL (ref 4.0–10.5)

## 2017-12-19 LAB — COMPREHENSIVE METABOLIC PANEL
ALK PHOS: 72 U/L (ref 38–126)
ALT: 11 U/L — AB (ref 14–54)
AST: 30 U/L (ref 15–41)
Albumin: 3.3 g/dL — ABNORMAL LOW (ref 3.5–5.0)
Anion gap: 8 (ref 5–15)
BUN: 13 mg/dL (ref 6–20)
CALCIUM: 8.1 mg/dL — AB (ref 8.9–10.3)
CO2: 27 mmol/L (ref 22–32)
CREATININE: 0.63 mg/dL (ref 0.44–1.00)
Chloride: 77 mmol/L — ABNORMAL LOW (ref 101–111)
Glucose, Bld: 126 mg/dL — ABNORMAL HIGH (ref 65–99)
Potassium: 4.8 mmol/L (ref 3.5–5.1)
SODIUM: 112 mmol/L — AB (ref 135–145)
Total Bilirubin: 1.1 mg/dL (ref 0.3–1.2)
Total Protein: 7 g/dL (ref 6.5–8.1)

## 2017-12-19 LAB — TSH: TSH: 1.187 u[IU]/mL (ref 0.350–4.500)

## 2017-12-19 MED ORDER — ALPRAZOLAM 0.25 MG PO TABS
0.2500 mg | ORAL_TABLET | Freq: Two times a day (BID) | ORAL | Status: DC | PRN
  Administered 2017-12-22 – 2017-12-24 (×4): 0.25 mg via ORAL
  Filled 2017-12-19 (×4): qty 1

## 2017-12-19 MED ORDER — ONDANSETRON HCL 4 MG PO TABS: 4.0000 mg | ORAL_TABLET | Freq: Four times a day (QID) | ORAL | Status: DC | PRN

## 2017-12-19 MED ORDER — ENOXAPARIN SODIUM 40 MG/0.4ML ~~LOC~~ SOLN
40.0000 mg | Freq: Every day | SUBCUTANEOUS | Status: DC
  Administered 2017-12-20 – 2017-12-28 (×10): 40 mg via SUBCUTANEOUS
  Filled 2017-12-19 (×11): qty 0.4

## 2017-12-19 MED ORDER — AMLODIPINE BESYLATE 5 MG PO TABS: 10.0000 mg | ORAL_TABLET | Freq: Every day | ORAL | Status: DC

## 2017-12-19 MED ORDER — ACETAMINOPHEN 325 MG PO TABS: 650.0000 mg | ORAL_TABLET | Freq: Four times a day (QID) | ORAL | Status: DC | PRN

## 2017-12-19 MED ORDER — DIPHENHYDRAMINE HCL 25 MG PO CAPS
25.0000 mg | ORAL_CAPSULE | Freq: Four times a day (QID) | ORAL | Status: DC | PRN
  Administered 2017-12-24: 25 mg via ORAL
  Filled 2017-12-19: qty 1

## 2017-12-19 MED ORDER — ACETAMINOPHEN 650 MG RE SUPP: 650.0000 mg | Freq: Four times a day (QID) | RECTAL | Status: DC | PRN

## 2017-12-19 MED ORDER — OLOPATADINE HCL 0.1 % OP SOLN
1.0000 [drp] | Freq: Every day | OPHTHALMIC | Status: DC | PRN
  Filled 2017-12-19: qty 5

## 2017-12-19 MED ORDER — SODIUM CHLORIDE 0.9 % IV BOLUS (SEPSIS)
1000.0000 mL | Freq: Once | INTRAVENOUS | Status: AC
  Administered 2017-12-19: 1000 mL via INTRAVENOUS

## 2017-12-19 MED ORDER — ASPIRIN EC 81 MG PO TBEC
81.0000 mg | DELAYED_RELEASE_TABLET | Freq: Every day | ORAL | Status: DC
  Administered 2017-12-21 – 2017-12-29 (×9): 81 mg via ORAL
  Filled 2017-12-19 (×10): qty 1

## 2017-12-19 MED ORDER — LISINOPRIL 5 MG PO TABS: 5.0000 mg | ORAL_TABLET | Freq: Every day | ORAL | Status: DC

## 2017-12-19 MED ORDER — ALBUTEROL SULFATE (2.5 MG/3ML) 0.083% IN NEBU
2.5000 mg | INHALATION_SOLUTION | Freq: Four times a day (QID) | RESPIRATORY_TRACT | Status: DC | PRN
  Administered 2017-12-25 – 2017-12-26 (×4): 2.5 mg via RESPIRATORY_TRACT
  Filled 2017-12-19 (×4): qty 3

## 2017-12-19 MED ORDER — ONDANSETRON HCL 4 MG/2ML IJ SOLN
4.0000 mg | Freq: Four times a day (QID) | INTRAMUSCULAR | Status: DC | PRN
  Filled 2017-12-19: qty 2

## 2017-12-19 MED ORDER — FLUTICASONE PROPIONATE 50 MCG/ACT NA SUSP
2.0000 | Freq: Every day | NASAL | Status: DC
  Administered 2017-12-21 – 2017-12-29 (×9): 2 via NASAL
  Filled 2017-12-19 (×2): qty 16

## 2017-12-19 MED ORDER — LORATADINE 10 MG PO TABS
10.0000 mg | ORAL_TABLET | Freq: Every day | ORAL | Status: DC
  Administered 2017-12-21 – 2017-12-23 (×3): 10 mg via ORAL
  Filled 2017-12-19 (×3): qty 1

## 2017-12-19 MED ORDER — LEVOTHYROXINE SODIUM 50 MCG PO TABS
50.0000 ug | ORAL_TABLET | Freq: Every day | ORAL | Status: DC
  Administered 2017-12-21 – 2017-12-29 (×9): 50 ug via ORAL
  Filled 2017-12-19 (×10): qty 1

## 2017-12-19 MED ORDER — PROPRANOLOL HCL ER 60 MG PO CP24: 60.0000 mg | ORAL_CAPSULE | Freq: Every day | ORAL | Status: DC

## 2017-12-19 MED ORDER — MECLIZINE HCL 25 MG PO TABS
12.5000 mg | ORAL_TABLET | Freq: Four times a day (QID) | ORAL | Status: DC | PRN
  Administered 2017-12-23 – 2017-12-26 (×5): 12.5 mg via ORAL
  Filled 2017-12-19 (×5): qty 1

## 2017-12-19 MED ORDER — DEMECLOCYCLINE HCL 150 MG PO TABS
150.0000 mg | ORAL_TABLET | Freq: Two times a day (BID) | ORAL | Status: DC
  Administered 2017-12-20 – 2017-12-21 (×3): 150 mg via ORAL
  Filled 2017-12-19 (×4): qty 1

## 2017-12-19 MED ORDER — PANTOPRAZOLE SODIUM 40 MG PO TBEC
40.0000 mg | DELAYED_RELEASE_TABLET | Freq: Every day | ORAL | Status: DC
  Administered 2017-12-21 – 2017-12-29 (×9): 40 mg via ORAL
  Filled 2017-12-19 (×9): qty 1

## 2017-12-19 NOTE — ED Provider Notes (Signed)
[facility]-EMERGENCY DEPT Provider Note   CSN: [id number] Arrival date & time: 12/19/17  1727     History   Chief Complaint Chief Complaint  Patient presents with  . Abnormal Lab  . Weakness  . Shortness of Breath    HPI LILYANNA LUNT is a 84 y.o. female.  HPI  Patient is a 84 year old female presenting because her labs are abnormal her PCP.  Patient was found to be hyponatremic.  Patient had a couple days of vomiting.  And diarrhea.  Patient denies any abdominal pain.  Mildly confused on exam.  Past Medical History:  Diagnosis Date  . Abnormal heart rhythms   . Anxiety   . Chronic lower back pain   . Depression   . Fatty liver   . GERD (gastroesophageal reflux disease)   . 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 High cholesterol   . Hyperlipidemia   . Hypertension   . Hypothyroidism   . Internal hemorrhoid   . NSTEMI (non-ST elevated myocardial infarction) (HCC) 08/28/2016  . Pneumonia 2016  . Thyroid disease     Patient Active Problem List   Diagnosis Date Noted  . Essential hypertension 08/29/2016  . Hypothyroidism 08/29/2016  . Abnormal EKG   . Elevated troponin   . NSTEMI (non-ST elevated myocardial infarction) (HCC) 08/28/2016  . Decubitus ulcer of sacral region, stage 3 (HCC) 12/18/2015  . Klebsiella cystitis 12/16/2015  . Acute delirium 12/16/2015  . Aspiration pneumonia (HCC) 12/16/2015  . Leukocytosis   . Hypokalemia   . Encounter for central line placement   . Anxiety state   . Acute on chronic respiratory failure with hypoxia (HCC)   . Essential hypertension   . Physical deconditioning   . Altered mental status   . Hypothyroidism   . Chronic diarrhea   . Hyponatremia   . CAP (community acquired pneumonia) 10/29/2015  . History of CVA (cerebrovascular accident) 01/19/2014  . Dysphagia, post-stroke 01/15/2014  . Bacteremia due to Staphylococcus 01/13/2014  . Acute respiratory failure with hypoxia (HCC)  01/13/2014  . Acute encephalopathy 01/12/2014  . Left hemiplegia (HCC) 01/12/2014  . Hypertension 01/11/2014  . Acute hyponatremia 01/11/2014  . Hyperkalemia 01/11/2014  . Hematuria 01/11/2014  . E. coli UTI (urinary tract infection) 01/11/2014  . GERD (gastroesophageal reflux disease) 11/14/2011  . Obesities, morbid (HCC) 11/14/2011    Past Surgical History:  Procedure Laterality Date  . ABDOMINAL HYSTERECTOMY    . CATARACT EXTRACTION W/ INTRAOCULAR LENS  IMPLANT, BILATERAL Bilateral   . CESAREAN SECTION    . TEE WITHOUT CARDIOVERSION N/A 01/19/2014   Procedure: TRANSESOPHAGEAL ECHOCARDIOGRAM (TEE);  Surgeon: Laurey Morale, MD;  Location: [facility] ENDOSCOPY;  Service: Cardiovascular;  Laterality: N/A;  [name]  . VAGINAL HYSTERECTOMY      OB History    No data available       Home Medications    Prior to Admission medications   Medication Sig Start Date End Date Taking? Authorizing Provider  albuterol (PROVENTIL) (2.5 MG/3ML) 0.083% nebulizer solution Take 3 mLs (2.5 mg total) by nebulization every 6 (six) hours as needed for wheezing or shortness of breath. 11/01/15   Catarina Hartshorn, MD  ALPRAZolam Prudy Feeler) 0.5 MG tablet Take 0.5 tablets (0.25 mg total) by mouth 2 (two) times daily. 12/15/15   Leroy Sea, MD  ALPRAZolam Prudy Feeler) 1 MG tablet Take 0.5 mg by mouth 2 (two) times daily. 08/18/16   [provider]  amLODipine (  NORVASC) 10 MG tablet Take 1 tablet (10 mg total) by mouth daily. 12/15/15   Leroy Sea, MD  Ascorbic Acid (VITAMIN C PO) Take 1 tablet by mouth daily.    [provider]  Aspirin 81 MG EC tablet Take 81 mg by mouth daily.    [provider]  aspirin EC 81 MG tablet Take 81 mg by mouth daily.    [provider]  calcium carbonate (TUMS - DOSED IN MG ELEMENTAL CALCIUM) 500 MG chewable tablet Chew 1 tablet by mouth daily as needed for indigestion or heartburn.    [provider]  chlorpheniramine (CHLOR-TRIMETON)  4 MG tablet Take 4 mg by mouth 2 (two) times daily as needed for allergies. Reported on 01/02/2016    [provider]  diphenhydrAMINE (BENADRYL) 25 MG tablet Take 25 mg by mouth every 6 (six) hours as needed for itching or allergies.    [provider]  fluticasone (FLONASE) 50 MCG/ACT nasal spray Place 2 sprays into both nostrils daily.  08/16/16   [provider]  levothyroxine (SYNTHROID, LEVOTHROID) 50 MCG tablet Take 50 mcg by mouth daily before breakfast. 06/21/16   [provider]  levothyroxine (SYNTHROID, LEVOTHROID) 75 MCG tablet Take 0.5 tablets (37.5 mcg total) by mouth daily before breakfast. 12/15/15   Leroy Sea, MD  lisinopril (PRINIVIL,ZESTRIL) 10 MG tablet Take 1 tablet (10 mg total) by mouth daily. 12/15/15   Leroy Sea, MD  lisinopril (PRINIVIL,ZESTRIL) 10 MG tablet Take 1 tablet (10 mg total) by mouth daily. 08/30/16   Catarina Hartshorn, MD  loratadine (CLARITIN) 10 MG tablet Take 10 mg by mouth daily.    [provider]  meclizine (ANTIVERT) 12.5 MG tablet Take 12.5 mg by mouth every 6 (six) hours as needed for dizziness. 08/16/16   [provider]  olopatadine (PATANOL) 0.1 % ophthalmic solution APPLY 1 DROP IN BOTH EYES EVERY DAY FOR ALLERGY 12/30/15   Edmon Crape C, PA-C  olopatadine (PATANOL) 0.1 % ophthalmic solution Place 1 drop into both eyes daily as needed for allergies.  07/31/16   [provider]  omeprazole (PRILOSEC) 20 MG capsule Take 20 mg by mouth every morning. 08/16/16   [provider]  pantoprazole (PROTONIX) 40 MG tablet Take 40 mg by mouth daily. 11/10/15   [provider]  pravastatin (PRAVACHOL) 40 MG tablet Take 1 tablet (40 mg total) by mouth daily at 6 PM. 08/30/16   Tat, Onalee Hua, MD  propranolol ER (INDERAL LA) 60 MG 24 hr capsule Take 1 capsule (60 mg total) by mouth daily. 01/29/14   Angiulli, Mcarthur Rossetti, PA-C  propranolol ER (INDERAL LA) 60 MG 24 hr capsule Take 60 mg by mouth  daily. 07/23/16   [provider]  QUEtiapine (SEROQUEL) 25 MG tablet Take 1 tablet (25 mg total) by mouth at bedtime. 12/15/15   Leroy Sea, MD  ranitidine (ZANTAC) 300 MG tablet Take 300 mg by mouth every evening. 08/17/16   [provider]  tamsulosin (FLOMAX) 0.4 MG CAPS capsule Take 1 capsule (0.4 mg total) by mouth daily. 12/15/15   Leroy Sea, MD    Family History Family History  Problem Relation Age of Onset  . Stroke Father   . Hypertension Father   . Colon cancer Cousin   . Colon cancer Unknown        Aunt  . Diabetes Unknown        aunt and cousins  . Heart disease Unknown  cousins    Social History Social History   Tobacco Use  . Smoking status: Never Smoker  . Smokeless tobacco: Former NeurosurgeonUser    Types: Snuff  . Tobacco comment: "used snuff when I was 19"  Substance Use Topics  . Alcohol use: No  . Drug use: No     Allergies   Apple flavor; Crestor [rosuvastatin calcium]; Keflex [cephalexin]; Peanut butter flavor; Sulfa antibiotics; Cephalexin; Crestor [rosuvastatin calcium]; and Sulfa drugs cross reactors   Review of Systems Review of Systems  Constitutional: Negative for fatigue and fever.  Cardiovascular: Negative for chest pain.  Gastrointestinal: Positive for diarrhea and vomiting.  Genitourinary: Negative for difficulty urinating.     Physical Exam Updated Vital Signs BP (!) 167/71   Pulse 66   Resp (!) 24   SpO2 100%   Physical Exam  Constitutional: She is oriented to person, place, and time. She appears well-developed and well-nourished.  HENT:  Head: Normocephalic and atraumatic.  Eyes: Right eye exhibits no discharge.  Cardiovascular: Normal rate, regular rhythm and normal heart sounds.  No murmur heard. Pulmonary/Chest: Effort normal. No respiratory distress. She has decreased breath sounds. She has no wheezes. She has no rales.  Abdominal: Soft. She exhibits no distension. There is no tenderness.    Neurological: She is oriented to person, place, and time.  Skin: Skin is warm and dry. She is not diaphoretic.  Psychiatric: She has a normal mood and affect.  Nursing note and vitals reviewed.    ED Treatments / Results  Labs (all labs ordered are listed, but only abnormal results are displayed) Labs Reviewed  COMPREHENSIVE METABOLIC PANEL  CBC WITH DIFFERENTIAL/PLATELET  URINALYSIS, ROUTINE W REFLEX MICROSCOPIC  NA AND K (SODIUM & POTASSIUM), RAND UR    EKG  EKG Interpretation None       Radiology Dg Chest 2 View  Result Date: 12/19/2017 CLINICAL DATA:  Cough, congestion, sob EXAM: CHEST  2 VIEW COMPARISON:  12/13/2015 FINDINGS: Shallow lung inflation. Stable elevation of the right hemidiaphragm. The heart is mildly enlarged. There is atherosclerotic calcification of the thoracic aorta. Mild pulmonary vascular congestion without overt edema. No focal consolidations. IMPRESSION: Stable cardiomegaly.  Mild vascular congestion without overt edema. Electronically Signed   By: Norva PavlovElizabeth  Brown M.D.   On: 12/19/2017 17:51    Procedures Procedures (including critical care time)  Medications Ordered in ED Medications  sodium chloride 0.9 % bolus 1,000 mL (not administered)     Initial Impression / Assessment and Plan / ED Course  I have reviewed the triage vital signs and the nursing notes.  Pertinent labs & imaging results that were available during my care of the patient were reviewed by me and considered in my medical decision making (see chart for details).     Patient is a 84 year old female presenting because her labs are abnormal her PCP.  Patient was found to be hyponatremic.  Patient had a couple days of vomiting.  And diarrhea.  Patient denies any abdominal pain.  Mildly confused on exam. No seizures.   6:23 PM Sent here for evaluation of hyponatremia.  Patient has been both having diarrhea and vomiting.  Likely driving electrolyte derangements.    On arrival  patient was hypoxic to 70%.  Nonrebreather patient bounce back up.  We are able to wean her 4 L satting 100%.  Labs show hyponatremia.  Will admit. Gave 1 L of fluid, will recheck for 4 hours.  I think this is likely from hypovolemic bulimia.  Patient's been vomiting and having diarrhea.  Patient has no abdominal tenderness on exam so I don;t think CT is warranted at this time.   Final Clinical Impressions(s) / ED Diagnoses   Final diagnoses:  None    ED Discharge Orders    None       Abelino Derrick, MD 12/19/17 2338

## 2017-12-19 NOTE — ED Triage Notes (Addendum)
Patient BIB family, reports patient was [facility] today and dx with hyponatremia (Na 110). Patient c/o SOB and  generalized weakness. Oxygen saturation initially in the 70's on RA. Placed on [name] and then non rebreather in triage. Denies cough, N/V/D. 100% on NRB

## 2017-12-19 NOTE — ED Notes (Signed)
Bed: WA10 Expected date:  Expected time:  Means of arrival:  Comments: Triage 1   

## 2017-12-19 NOTE — H&P (Addendum)
History and Physical    Tammy Fernandez DOB: [birth year] DOA: 12/19/2017  Referring MD/NP/PA: Dr. Corlis LeakMackuen PCP: Elias Elseeade, Robert, MD  Patient coming from:   Chief Complaint: Weakness and abnormal labs  I have personally briefly reviewed patient's old medical records in [facility]   HPI: Tammy Fernandez is a 84 y.o. female with medical history significant of HTN, HLD, anxiety/depression, hyponatremia, hypothyroidism; who presents with weakness and abnormal labs.  History is obtained from the patient's son as she is currently altered and only says ok at this time  patient was noted to have constipation for which she had been taking MiraLAX normal and had 2-3 days of diarrhea prior to family no see what was going.  Over the last 2 days patient has been eating and drinking a little better, but was significantly weak and more confused per family.  At this time they note that the diarrhea had stopped.  Associated symptoms include increased lethargy.  She was evaluated by her PCP and lab work was obtained for which it revealed that she was significantly hyponatremic and patient was advised to come to the emergency department for further evaluation.  Patient had been admitted into the hospital with similar symptoms in 11/2015.  ED Course: Upon admission into the emergency department patient was noted to have heart rates 49-66, respirations 17-24, blood pressure 116/58-167/71, and O2 saturations 93-100% on 2 liters of [place] oxygen.  Labs revealed a sodium of 112, potassium 4.8, chloride 77, BUN 13, andcreatinine 0.63.  Patient had been given 1 L of normal saline fluids.  TRH called to admit.  Review of Systems  Unable to perform ROS: Mental status change  Neurological: Positive for weakness.    Past Medical History:  Diagnosis Date  . Abnormal heart rhythms   . Anxiety   . Chronic lower back pain   . Depression   . Fatty liver   . GERD (gastroesophageal reflux disease)   . 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 High cholesterol   . Hyperlipidemia   . Hypertension   . Hypothyroidism   . Internal hemorrhoid   . NSTEMI (non-ST elevated myocardial infarction) (HCC) 08/28/2016  . Pneumonia 2016  . Thyroid disease     Past Surgical History:  Procedure Laterality Date  . ABDOMINAL HYSTERECTOMY    . CATARACT EXTRACTION W/ INTRAOCULAR LENS  IMPLANT, BILATERAL Bilateral   . CESAREAN SECTION    . TEE WITHOUT CARDIOVERSION N/A 01/19/2014   Procedure: TRANSESOPHAGEAL ECHOCARDIOGRAM (TEE);  Surgeon: Laurey Moralealton S McLean, MD;  Location[facility] ENDOSCOPY;  Service: Cardiovascular;  Laterality: N/A;  [name]/ja  . VAGINAL HYSTERECTOMY       reports that  has never smoked. She has quit using smokeless tobacco. Her smokeless tobacco use included snuff. She reports that she does not drink alcohol or use drugs.  Allergies  Allergen Reactions  . Apple Flavor Hives    No apple sauce  . Crestor [Rosuvastatin Calcium] Other (See Comments)    Muscle Aches  . Keflex [Cephalexin] Other (See Comments)    dizziness  . Peanut Butter Flavor Hives  . Sulfa Antibiotics Hives  . Cephalexin Other (See Comments)    dizziness  . Crestor [Rosuvastatin Calcium]   . Sulfa Drugs Cross Reactors Rash    Family History  Problem Relation Age of Onset  . Stroke Father   . Hypertension Father   . Colon cancer Cousin   . Colon cancer Unknown  Aunt  . Diabetes Unknown        aunt and cousins  . Heart disease Unknown        cousins    Prior to Admission medications   Medication Sig Start Date End Date Taking? Authorizing Provider  albuterol (PROVENTIL) (2.5 MG/3ML) 0.083% nebulizer solution Take 3 mLs (2.5 mg total) by nebulization every 6 (six) hours as needed for wheezing or shortness of breath. 11/01/15  Yes Tat, Onalee Hua, MD  ALPRAZolam Prudy Feeler) 0.5 MG tablet Take 0.5 tablets (0.25 mg total) by mouth 2 (two) times daily. Patient taking differently: Take 0.25 mg by mouth 2 (two)  times daily as needed for anxiety.  12/15/15  Yes Leroy Sea, MD  amLODipine (NORVASC) 10 MG tablet Take 1 tablet (10 mg total) by mouth daily. 12/15/15  Yes Leroy Sea, MD  Ascorbic Acid (VITAMIN C PO) Take 1 tablet by mouth daily.   Yes [provider]  Aspirin 81 MG EC tablet Take 81 mg by mouth daily.   Yes [provider]  calcium carbonate (TUMS - DOSED IN MG ELEMENTAL CALCIUM) 500 MG chewable tablet Chew 1 tablet by mouth daily as needed for indigestion or heartburn.   Yes [provider]  chlorpheniramine (CHLOR-TRIMETON) 4 MG tablet Take 4 mg by mouth 2 (two) times daily as needed for allergies. Reported on 01/02/2016   Yes [provider]  demeclocycline (DECLOMYCIN) 300 MG tablet Take 150 mg by mouth 2 (two) times daily.   Yes [provider]  diphenhydrAMINE (BENADRYL) 25 MG tablet Take 25 mg by mouth every 6 (six) hours as needed for itching or allergies.   Yes [provider]  fluticasone (FLONASE) 50 MCG/ACT nasal spray Place 2 sprays into both nostrils daily.  08/16/16  Yes [provider]  levothyroxine (SYNTHROID, LEVOTHROID) 50 MCG tablet Take 50 mcg by mouth daily before breakfast. 06/21/16  Yes [provider]  lisinopril (PRINIVIL,ZESTRIL) 10 MG tablet Take 1 tablet (10 mg total) by mouth daily. Patient taking differently: Take 5 mg by mouth daily.  08/30/16  Yes Tat, Onalee Hua, MD  loratadine (CLARITIN) 10 MG tablet Take 10 mg by mouth daily.   Yes [provider]  meclizine (ANTIVERT) 12.5 MG tablet Take 12.5 mg by mouth every 6 (six) hours as needed for dizziness. 08/16/16  Yes [provider]  olopatadine (PATANOL) 0.1 % ophthalmic solution Place 1 drop into both eyes daily as needed for allergies.  07/31/16  Yes [provider]  pantoprazole (PROTONIX) 40 MG tablet Take 40 mg by mouth daily. 11/10/15  Yes [provider]  propranolol ER (INDERAL LA) 60 MG 24 hr  capsule Take 1 capsule (60 mg total) by mouth daily. 01/29/14  Yes Angiulli, Mcarthur Rossetti, PA-C  levothyroxine (SYNTHROID, LEVOTHROID) 75 MCG tablet Take 0.5 tablets (37.5 mcg total) by mouth daily before breakfast. Patient not taking: Reported on 12/19/2017 12/15/15   Leroy Sea, MD  olopatadine (PATANOL) 0.1 % ophthalmic solution APPLY 1 DROP IN BOTH EYES EVERY DAY FOR ALLERGY Patient not taking: Reported on 12/19/2017 12/30/15   Roena Malady, PA-C  pravastatin (PRAVACHOL) 40 MG tablet Take 1 tablet (40 mg total) by mouth daily at 6 PM. Patient not taking: Reported on 12/19/2017 08/30/16   Catarina Hartshorn, MD  QUEtiapine (SEROQUEL) 25 MG tablet Take 1 tablet (25 mg total) by mouth at bedtime. Patient not taking: Reported on 12/19/2017 12/15/15   Leroy Sea, MD  tamsulosin (FLOMAX) 0.4 MG  CAPS capsule Take 1 capsule (0.4 mg total) by mouth daily. Patient not taking: Reported on 12/19/2017 12/15/15   Leroy Sea, MD    Physical Exam:  Constitutional: Elderly female who is lethargic and will await with verbal commands, but okay and does not follow commands. Vitals:   12/19/17 1736 12/19/17 1952  BP: (!) 167/71 (!) 156/71  Pulse: 66 (!) 57  Resp: (!) 24 17  SpO2: 100% 97%   Eyes: PERRL, lids and conjunctivae normal ENMT: Mucous membranes are dry. Posterior pharynx clear of any exudate or lesions.Normal dentition.  Neck: normal, supple, no masses, no thyromegaly Respiratory: clear to auscultation bilaterally, no wheezing, no crackles.  Normal respiratory effort. No accessory muscle use.  Cardiovascular: Regular rate and rhythm, no murmurs / rubs / gallops. No extremity edema. 2+ pedal pulses. No carotid bruits.  Abdomen: no tenderness, no masses palpated. No hepatosplenomegaly. Bowel sounds positive.  Musculoskeletal: no clubbing / cyanosis. No joint deformity upper and lower extremities. Good ROM, no contractures. Normal muscle tone.  Skin: no rashes, lesions, ulcers. No  induration Neurologic: CN 2-12 grossly intact.  Patient appears able to move all extremities. Psychiatric: Unable to fully assess at this time due to lethargy.    Labs on Admission: I have personally reviewed following labs and imaging studies  CBC: Recent Labs  Lab 12/19/17 1912  WBC 6.9  NEUTROABS 5.6  HGB 14.6  HCT 41.5  MCV 86.6  PLT 262   Basic Metabolic Panel: Recent Labs  Lab 12/19/17 1919  NA 112*  K 4.8  CL 77*  CO2 27  GLUCOSE 126*  BUN 13  CREATININE 0.63  CALCIUM 8.1*   GFR: CrCl cannot be calculated (Unknown ideal weight.). Liver Function Tests: Recent Labs  Lab 12/19/17 1919  AST 30  ALT 11*  ALKPHOS 72  BILITOT 1.1  PROT 7.0  ALBUMIN 3.3*   No results for input(s): LIPASE, AMYLASE in the last 168 hours. No results for input(s): AMMONIA in the last 168 hours. Coagulation Profile: No results for input(s): INR, PROTIME in the last 168 hours. Cardiac Enzymes: No results for input(s): CKTOTAL, CKMB, CKMBINDEX, TROPONINI in the last 168 hours. BNP (last 3 results) No results for input(s): PROBNP in the last 8760 hours. HbA1C: No results for input(s): HGBA1C in the last 72 hours. CBG: No results for input(s): GLUCAP in the last 168 hours. Lipid Profile: No results for input(s): CHOL, HDL, LDLCALC, TRIG, CHOLHDL, LDLDIRECT in the last 72 hours. Thyroid Function Tests: Recent Labs    12/19/17 1840  TSH 1.187   Anemia Panel: No results for input(s): VITAMINB12, FOLATE, FERRITIN, TIBC, IRON, RETICCTPCT in the last 72 hours. Urine analysis:    Component Value Date/Time   COLORURINE YELLOW 12/11/2015 1215   APPEARANCEUR CLEAR 12/11/2015 1215   LABSPEC 1.012 12/11/2015 1215   PHURINE 6.5 12/11/2015 1215   GLUCOSEU NEGATIVE 12/11/2015 1215   HGBUR MODERATE (A) 12/11/2015 1215   BILIRUBINUR NEGATIVE 12/11/2015 1215   KETONESUR >80 (A) 12/11/2015 1215   PROTEINUR NEGATIVE 12/11/2015 1215   UROBILINOGEN 0.2 01/11/2014 1951   NITRITE  NEGATIVE 12/11/2015 1215   LEUKOCYTESUR NEGATIVE 12/11/2015 1215   Sepsis Labs: No results found for this or any previous visit (from the past 240 hour(s)).   Radiological Exams on Admission: Dg Chest 2 View  Result Date: 12/19/2017 CLINICAL DATA:  Dyspnea EXAM: CHEST  2 VIEW COMPARISON:  12/19/2017 chest radiograph. FINDINGS: Stable cardiomediastinal silhouette with mild cardiomegaly, prominent main pulmonary artery contour and  aortic atherosclerosis. No pneumothorax. Stable prominent eventration of the anterior right hemidiaphragm. No convincing pleural effusion. Stable moderate right basilar atelectasis. Borderline mild pulmonary edema, slightly worsened. IMPRESSION: 1. Borderline mild congestive heart failure, slightly worsened. 2. Stable prominent eventration of the anterior right hemidiaphragm with associated moderate right basilar atelectasis. Electronically Signed   By: Delbert Phenix M.D.   On: 12/19/2017 19:03   Dg Chest 2 View  Result Date: 12/19/2017 CLINICAL DATA:  Cough, congestion, sob EXAM: CHEST  2 VIEW COMPARISON:  12/13/2015 FINDINGS: Shallow lung inflation. Stable elevation of the right hemidiaphragm. The heart is mildly enlarged. There is atherosclerotic calcification of the thoracic aorta. Mild pulmonary vascular congestion without overt edema. No focal consolidations. IMPRESSION: Stable cardiomegaly.  Mild vascular congestion without overt edema. Electronically Signed   By: Norva Pavlov M.D.   On: 12/19/2017 17:51    EKG: Independently reviewed.  Sinus rhythm with repolarization  abnormality  Assessment/Plan Hyponatremia: Acute on chronic.  Patient presents with initial sodium of 112 admission.  Patient appears significantly confused and answering okay to questions.  Given history of recent diarrhea and low chloride levels suspect hypovolemic hyponatremia.  Patient had been given 1 L of normal saline fluid repeat CMP pending. - Admit to a stepdown bed - Neurochecks  -  Strict intake and output - Check BMPs every 4 hours - Goal correction of sodium no greater than 33mmol/L per day(rising sodium should not exceed 122 mmol/l on 12/18 at 1900, and no more than 132 mmol/lon 12/19 at 1800. - Adjust IV fluids as needed - Continue dicyclomine - Consulted PCCM for possible needed of administration of 3% saline for current  signs of neurologic decline, will follow-up for further recommendations  Acute metabolic encephalopathy: Suspect secondary to significant hyponatremia. - Monitoring as noted above   Essential hypertension : Initial blood pressures noted to be intermittently soft. - hold lisinopril, propranolol, and amlodipine - Restart when medically appropriate  Allergies - Continue Claritin, prn Benadryl, and Flonase  Hypothyroidism - Check TSH - Continue levothyroxine  Anxiety - Continue Xanax as needed  Gerd - Continue Protonix  DVT prophylaxis: Lovenox   Code Status: Full  Family Communication: Discussed plan of care with the patient and family present at bedside Disposition Plan:  TBD Consults called: none Admission status: inpatient   Clydie Braun MD [facility] Pager [phone]   If 7PM-7AM, please contact night-coverage [email or web address] Password [email or web address]  12/19/2017, 11:01 PM

## 2017-12-20 ENCOUNTER — Inpatient Hospital Stay (HOSPITAL_COMMUNITY): Payer: Medicare Other

## 2017-12-20 ENCOUNTER — Other Ambulatory Visit: Payer: Self-pay

## 2017-12-20 DIAGNOSIS — I351 Nonrheumatic aortic (valve) insufficiency: Secondary | ICD-10-CM

## 2017-12-20 DIAGNOSIS — I361 Nonrheumatic tricuspid (valve) insufficiency: Secondary | ICD-10-CM

## 2017-12-20 LAB — ECHOCARDIOGRAM COMPLETE
AVLVOTPG: 5 mmHg
AVPHT: 374 ms
CHL CUP DOP CALC LVOT VTI: 21.6 cm
CHL CUP TV REG PEAK VELOCITY: 379 cm/s
E/e' ratio: 14.64
EWDT: 250 ms
FS: 37 % (ref 28–44)
HEIGHTINCHES: 66 in
IVS/LV PW RATIO, ED: 1.67
LA diam index: 1.94 cm/m2
LA vol A4C: 31.2 ml
LASIZE: 37 mm
LAVOL: 32.5 mL
LAVOLIN: 17 mL/m2
LDCA: 2.84 cm2
LEFT ATRIUM END SYS DIAM: 37 mm
LV E/e' medial: 14.64
LV E/e'average: 14.64
LV PW d: 8.09 mm — AB (ref 0.6–1.1)
LV e' LATERAL: 5 cm/s
LVOT SV: 61 mL
LVOT peak vel: 115 cm/s
LVOTD: 19 mm
Lateral S' vel: 7.07 cm/s
MV Dec: 250
MV Peak grad: 2 mmHg
MV pk E vel: 73.2 m/s
MVPKAVEL: 93.6 m/s
TAPSE: 12.9 mm
TDI e' lateral: 5
TDI e' medial: 4.35
TRMAXVEL: 379 cm/s
WEIGHTICAEL: 2880 [oz_av]

## 2017-12-20 LAB — COMPREHENSIVE METABOLIC PANEL
ALBUMIN: 3.5 g/dL (ref 3.5–5.0)
ALT: 11 U/L — ABNORMAL LOW (ref 14–54)
AST: 22 U/L (ref 15–41)
Alkaline Phosphatase: 79 U/L (ref 38–126)
Anion gap: 7 (ref 5–15)
BUN: 15 mg/dL (ref 6–20)
CHLORIDE: 73 mmol/L — AB (ref 101–111)
CO2: 30 mmol/L (ref 22–32)
Calcium: 8.5 mg/dL — ABNORMAL LOW (ref 8.9–10.3)
Creatinine, Ser: 0.76 mg/dL (ref 0.44–1.00)
GFR calc Af Amer: >60 mL/min (ref >60)
Glucose, Bld: 144 mg/dL — ABNORMAL HIGH (ref 65–99)
Potassium: 4.7 mmol/L (ref 3.5–5.1)
Sodium: 110 mmol/L — CL (ref 135–145)
Total Bilirubin: 0.7 mg/dL (ref 0.3–1.2)
Total Protein: 7.6 g/dL (ref 6.5–8.1)

## 2017-12-20 LAB — URINALYSIS, ROUTINE W REFLEX MICROSCOPIC
BILIRUBIN URINE: NEGATIVE
Bacteria, UA: NONE SEEN
GLUCOSE, UA: NEGATIVE mg/dL
Hgb urine dipstick: NEGATIVE
KETONES UR: NEGATIVE mg/dL
Leukocytes, UA: NEGATIVE
NITRITE: NEGATIVE
PH: 6 (ref 5.0–8.0)
Protein, ur: 100 mg/dL — AB
Specific Gravity, Urine: 1.015 (ref 1.005–1.030)

## 2017-12-20 LAB — SODIUM
SODIUM: 108 mmol/L — AB (ref 135–145)
SODIUM: 112 mmol/L — AB (ref 135–145)
Sodium: 116 mmol/L — CL (ref 135–145)
Sodium: 118 mmol/L — CL (ref 135–145)
Sodium: 118 mmol/L — CL (ref 135–145)
Sodium: 118 mmol/L — CL (ref 135–145)

## 2017-12-20 LAB — BRAIN NATRIURETIC PEPTIDE: B NATRIURETIC PEPTIDE 5: 1214.7 pg/mL — AB (ref 0.0–100.0)

## 2017-12-20 LAB — RAPID URINE DRUG SCREEN, HOSP PERFORMED
AMPHETAMINES: NOT DETECTED
BARBITURATES: NOT DETECTED
Benzodiazepines: POSITIVE — AB
COCAINE: NOT DETECTED
OPIATES: NOT DETECTED
TETRAHYDROCANNABINOL: NOT DETECTED

## 2017-12-20 LAB — OSMOLALITY: OSMOLALITY: 249 mosm/kg — AB (ref 275–295)

## 2017-12-20 LAB — SODIUM, URINE, RANDOM: Sodium, Ur: <10 mmol/L

## 2017-12-20 LAB — OSMOLALITY, URINE: Osmolality, Ur: 375 mOsm/kg (ref 300–900)

## 2017-12-20 LAB — NA AND K (SODIUM & POTASSIUM), RAND UR
Potassium Urine: 32 mmol/L
Sodium, Ur: <10 mmol/L

## 2017-12-20 MED ORDER — FUROSEMIDE 10 MG/ML IJ SOLN
20.0000 mg | Freq: Once | INTRAMUSCULAR | Status: AC
  Administered 2017-12-20: 20 mg via INTRAVENOUS
  Filled 2017-12-20: qty 4

## 2017-12-20 MED ORDER — SODIUM CHLORIDE 0.9% FLUSH
10.0000 mL | Freq: Two times a day (BID) | INTRAVENOUS | Status: DC
  Administered 2017-12-20: 30 mL
  Administered 2017-12-20: 40 mL
  Administered 2017-12-21 – 2017-12-27 (×10): 10 mL

## 2017-12-20 MED ORDER — SODIUM CHLORIDE 0.9 % IV SOLN
INTRAVENOUS | Status: DC
  Administered 2017-12-20: 16:00:00 via INTRAVENOUS

## 2017-12-20 MED ORDER — SODIUM CHLORIDE 0.9 % IV SOLN
INTRAVENOUS | Status: DC
  Administered 2017-12-20 (×2): via INTRAVENOUS

## 2017-12-20 MED ORDER — SODIUM CHLORIDE 0.9% FLUSH
10.0000 mL | INTRAVENOUS | Status: DC | PRN
  Administered 2017-12-23 – 2017-12-24 (×2): 10 mL
  Filled 2017-12-20 (×2): qty 40

## 2017-12-20 MED ORDER — SODIUM CHLORIDE 3 % IV SOLN
INTRAVENOUS | Status: DC
  Administered 2017-12-20: 20 mL/h via INTRAVENOUS
  Filled 2017-12-20: qty 500

## 2017-12-20 NOTE — Progress Notes (Signed)
Peripherally Inserted Central Catheter/Midline Placement  The IV Nurse has discussed with the patient and/or persons authorized to consent for the patient, the purpose of this procedure and the potential benefits and risks involved with this procedure.  The benefits include less needle sticks, lab draws from the catheter, and the patient may be discharged home with the catheter. Risks include, but not limited to, infection, bleeding, blood clot (thrombus formation), and puncture of an artery; nerve damage and irregular heartbeat and possibility to perform a PICC exchange if needed/ordered by physician.  Alternatives to this procedure were also discussed.  Bard Power PICC patient education guide, fact sheet on infection prevention and patient information card has been provided to patient /or left at bedside.    PICC/Midline Placement Documentation  PICC Double Lumen 12/20/17 PICC Right Brachial 35 cm 0 cm (Active)  Indication for Insertion or Continuance of Line Vasoactive infusions;Limited venous access - need for IV therapy >5 days (PICC only);Poor Vasculature-patient has had multiple peripheral attempts or PIVs lasting less than 24 hours 12/20/2017  1:06 PM  Exposed Catheter (cm) 0 cm 12/20/2017  1:06 PM  Site Assessment Clean;Dry;Intact 12/20/2017  1:06 PM  Lumen #1 Status Flushed;Saline locked;Blood return noted 12/20/2017  1:06 PM  Lumen #2 Status Flushed;Saline locked;Blood return noted 12/20/2017  1:06 PM  Dressing Type Transparent 12/20/2017  1:06 PM  Dressing Status Clean;Dry;Intact 12/20/2017  1:06 PM  Dressing Change Due 12/27/17 12/20/2017  1:06 PM       Ethelda Chickurrie, Junia Nygren Robert 12/20/2017, 1:07 PM

## 2017-12-20 NOTE — Consult Note (Signed)
.. ..  Name: Tammy Fernandez MRN: [id number] DOB: 05-Sep-1924    ADMISSION DATE:  12/19/2017 CONSULTATION DATE:  12/20/17  REFERRING MD :  Madelyn Flavors MD  CHIEF COMPLAINT:  DIARRHEA AND VOMITTING  BRIEF PATIENT DESCRIPTION: 84 YR OLD FEMALE WITH PMHX HTN, HLD, ANXIETY, HYPONATREMIA,HYPOTHYROIDISM PRESENTED CONFUSED WITH NA+ 112.  STUDIES:  CXR   HISTORY OF PRESENT ILLNESS:  84 yr old female with Pmhx of Arrhythmias, Anxiety, Chronic lower back pain, Depression, Fatty liver, GERD, High cholesterol, Hyperlipidemia, Hypertension, Hypothyroidism, Internal hemorrhoid, NSTEMI, Pneumonia, and previous episode of hyponatremia presented with confusion after having diarrhea at home and becoming dehydrated. Her son who is the source of this history states that she took double doses of Miralax to treat her constipation which resulted in 2-3 day history of diarrhea. Over the last 48 hrs she has had poor appetite and little fluid intake. She was seen by her PCP who noted that her labs showed a low sodium and asked her to come to the ED for evaluation. PCCM was consulted by hospitalist for assistance in hyponatremic mgmt with 3%  PAST MEDICAL HISTORY :   has a past medical history of Abnormal heart rhythms, Anxiety, Chronic lower back pain, Depression, Fatty liver, GERD (gastroesophageal reflux disease), Heart abnormality, High cholesterol, Hyperlipidemia, Hypertension, Hypothyroidism, Internal hemorrhoid, NSTEMI (non-ST elevated myocardial infarction) (HCC) (08/28/2016), Pneumonia (2016), and Thyroid disease.  has a past surgical history that includes Cesarean section; Abdominal hysterectomy; TEE without cardioversion (N/A, 01/19/2014); Vaginal hysterectomy; and Cataract extraction w/ intraocular lens  implant, bilateral (Bilateral). Prior to Admission medications   Medication Sig Start Date End Date Taking? Authorizing Provider  albuterol (PROVENTIL) (2.5 MG/3ML) 0.083% nebulizer solution Take 3 mLs (2.5  mg total) by nebulization every 6 (six) hours as needed for wheezing or shortness of breath. 11/01/15  Yes Tat, Onalee Hua, MD  ALPRAZolam Prudy Feeler) 0.5 MG tablet Take 0.5 tablets (0.25 mg total) by mouth 2 (two) times daily. Patient taking differently: Take 0.25 mg by mouth 2 (two) times daily as needed for anxiety.  12/15/15  Yes Leroy Sea, MD  amLODipine (NORVASC) 10 MG tablet Take 1 tablet (10 mg total) by mouth daily. 12/15/15  Yes Leroy Sea, MD  Ascorbic Acid (VITAMIN C PO) Take 1 tablet by mouth daily.   Yes [provider]  Aspirin 81 MG EC tablet Take 81 mg by mouth daily.   Yes [provider]  calcium carbonate (TUMS - DOSED IN MG ELEMENTAL CALCIUM) 500 MG chewable tablet Chew 1 tablet by mouth daily as needed for indigestion or heartburn.   Yes [provider]  chlorpheniramine (CHLOR-TRIMETON) 4 MG tablet Take 4 mg by mouth 2 (two) times daily as needed for allergies. Reported on 01/02/2016   Yes [provider]  demeclocycline (DECLOMYCIN) 300 MG tablet Take 150 mg by mouth 2 (two) times daily.   Yes [provider]  diphenhydrAMINE (BENADRYL) 25 MG tablet Take 25 mg by mouth every 6 (six) hours as needed for itching or allergies.   Yes [provider]  fluticasone (FLONASE) 50 MCG/ACT nasal spray Place 2 sprays into both nostrils daily.  08/16/16  Yes [provider]  levothyroxine (SYNTHROID, LEVOTHROID) 50 MCG tablet Take 50 mcg by mouth daily before breakfast. 06/21/16  Yes [provider]  lisinopril (PRINIVIL,ZESTRIL) 10 MG tablet Take 1 tablet (10 mg total) by mouth daily. Patient taking differently: Take 5 mg by mouth daily.  08/30/16  Yes TatOnalee Hua, MD  loratadine Tomma Lightning)  10 MG tablet Take 10 mg by mouth daily.   Yes [provider]  meclizine (ANTIVERT) 12.5 MG tablet Take 12.5 mg by mouth every 6 (six) hours as needed for dizziness. 08/16/16  Yes [provider]  olopatadine  (PATANOL) 0.1 % ophthalmic solution Place 1 drop into both eyes daily as needed for allergies.  07/31/16  Yes [provider]  pantoprazole (PROTONIX) 40 MG tablet Take 40 mg by mouth daily. 11/10/15  Yes [provider]  propranolol ER (INDERAL LA) 60 MG 24 hr capsule Take 1 capsule (60 mg total) by mouth daily. 01/29/14  Yes Angiulli, Mcarthur Rossetti, PA-C  levothyroxine (SYNTHROID, LEVOTHROID) 75 MCG tablet Take 0.5 tablets (37.5 mcg total) by mouth daily before breakfast. Patient not taking: Reported on 12/19/2017 12/15/15   Leroy Sea, MD  olopatadine (PATANOL) 0.1 % ophthalmic solution APPLY 1 DROP IN BOTH EYES EVERY DAY FOR ALLERGY Patient not taking: Reported on 12/19/2017 12/30/15   Roena Malady, PA-C  pravastatin (PRAVACHOL) 40 MG tablet Take 1 tablet (40 mg total) by mouth daily at 6 PM. Patient not taking: Reported on 12/19/2017 08/30/16   Catarina Hartshorn, MD  QUEtiapine (SEROQUEL) 25 MG tablet Take 1 tablet (25 mg total) by mouth at bedtime. Patient not taking: Reported on 12/19/2017 12/15/15   Leroy Sea, MD  tamsulosin (FLOMAX) 0.4 MG CAPS capsule Take 1 capsule (0.4 mg total) by mouth daily. Patient not taking: Reported on 12/19/2017 12/15/15   Leroy Sea, MD   Allergies  Allergen Reactions  . Apple Flavor Hives    No apple sauce  . Crestor [Rosuvastatin Calcium] Other (See Comments)    Muscle Aches  . Keflex [Cephalexin] Other (See Comments)    dizziness  . Peanut Butter Flavor Hives  . Sulfa Antibiotics Hives  . Cephalexin Other (See Comments)    dizziness  . Crestor [Rosuvastatin Calcium]   . Sulfa Drugs Cross Reactors Rash    FAMILY HISTORY:  family history includes Colon cancer in her cousin and unknown relative; Diabetes in her unknown relative; Heart disease in her unknown relative; Hypertension in her father; Stroke in her father. SOCIAL HISTORY:  reports that  has never smoked. She has quit using smokeless tobacco. Her smokeless tobacco use  included snuff. She reports that she does not drink alcohol or use drugs.  REVIEW OF SYSTEMS:  Pertinent positives are bolded Constitutional: Negative for fever, chills, weight loss, malaise/fatigue and diaphoresis.  HENT: Negative for hearing loss, ear pain, nosebleeds, congestion, sore throat, neck pain, tinnitus and ear discharge.   Eyes: Negative for blurred vision, double vision, photophobia, pain, discharge and redness.  Respiratory: Negative for cough, hemoptysis, sputum production, shortness of breath, wheezing and stridor.   Cardiovascular: Negative for chest pain, palpitations, orthopnea, claudication, leg swelling and PND.  Gastrointestinal: Negative for heartburn, nausea, vomiting, abdominal pain, diarrhea, constipation, blood in stool and melena.  Genitourinary: Negative for dysuria, urgency, frequency, hematuria and flank pain.  Musculoskeletal: Negative for myalgias, back pain, joint pain and falls.  Skin: Negative for itching and rash.  Neurological: Negative for dizziness, tingling, tremors, sensory change, speech change, focal weakness, seizures, loss of consciousness, weakness and headaches.  Endo/Heme/Allergies: Negative for environmental allergies and polydipsia. Does not bruise/bleed easily.  SUBJECTIVE:   VITAL SIGNS: Pulse Rate:  [49-66] 53 (01/17 2300) Resp:  [17-24] 18 (01/17 2300) BP: (116-167)/(58-104) 122/104 (01/17 2300) SpO2:  [93 %-100 %] 96 % (01/17 2300) Weight:  [81.6 kg (180 lb)] 81.6 kg (  180 lb) (01/18 0041)  PHYSICAL EXAMINATION: General:  In NAD Neuro:  No focal deficit, arousable, not oriented but verbal HEENT:  NCAT, PERRLA EOMi, dry oral mucosa Cardiovascular:  S1 and S2 appreciated Lungs:  Decreased breath sounds at bases Abdomen:  Soft NT ND +BS Musculoskeletal:  No gross deformity Skin:  Poor turgor  Recent Labs  Lab 12/19/17 1919 12/19/17 2326  NA 112* 110*  K 4.8 4.7  CL 77* 73*  CO2 27 30  BUN 13 15  CREATININE 0.63 0.76    GLUCOSE 126* 144*   Recent Labs  Lab 12/19/17 1912  HGB 14.6  HCT 41.5  WBC 6.9  PLT 262   Dg Chest 2 View  Result Date: 12/19/2017 CLINICAL DATA:  Dyspnea EXAM: CHEST  2 VIEW COMPARISON:  12/19/2017 chest radiograph. FINDINGS: Stable cardiomediastinal silhouette with mild cardiomegaly, prominent main pulmonary artery contour and aortic atherosclerosis. No pneumothorax. Stable prominent eventration of the anterior right hemidiaphragm. No convincing pleural effusion. Stable moderate right basilar atelectasis. Borderline mild pulmonary edema, slightly worsened. IMPRESSION: 1. Borderline mild congestive heart failure, slightly worsened. 2. Stable prominent eventration of the anterior right hemidiaphragm with associated moderate right basilar atelectasis. Electronically Signed   By: Delbert PhenixJason A Poff M.D.   On: 12/19/2017 19:03   Dg Chest 2 View  Result Date: 12/19/2017 CLINICAL DATA:  Cough, congestion, sob EXAM: CHEST  2 VIEW COMPARISON:  12/13/2015 FINDINGS: Shallow lung inflation. Stable elevation of the right hemidiaphragm. The heart is mildly enlarged. There is atherosclerotic calcification of the thoracic aorta. Mild pulmonary vascular congestion without overt edema. No focal consolidations. IMPRESSION: Stable cardiomegaly.  Mild vascular congestion without overt edema. Electronically Signed   By: Norva PavlovElizabeth  Brown M.D.   On: 12/19/2017 17:51    ASSESSMENT / PLAN: Active Issues:  NEURO: Acute Symptomatic Hyponatremia - volume status: Hypovolemic - no seizures, some confusion -normally hypovolemic hyponatremia would correct with aggressive volume resuscitation with normal saline-> calculated amt for this deficit exceeds 400cc/hr. Because of her CXR and signs of pulmonary edema this rate may be too high for the patient so at this time I recommend a combined approach. - recommended Rate of correction is 0.5 MEq/hr for no more than 6-628mEq within the first 24 hrs.  Recommend starting on 0.9%NS  at 250cc/hr and 3%at 20cc/hr Or If using 3% alone -> 7647ml/hr will achieve the same goal BMP Q 4 hrs Avoid sedative medications Seizure and Aspiration precautions. HOB elevated 30 degrees   CARDIAC: Hypovolemia secondary to dehydration/diarrhea Needs volume resuscitation  EF 55-60% on last ECHO in 2017 May need indwelling foley for I/O balance  PULMONARY: CXR shows mild pulmonary edema Check BNP Sat on room air >92% and in no distress Monitor closely given IVF resuscitation.  ID: No signs of active infection Diarrhea secondary to Miralax- medication induced Would not administer abx  Send PCT if pt develops a fever or WBC    I, Dr Newell CoralKristen Zacchaeus Halm have personally reviewed patient's available data, including medical history, events of note, physical examination and test results as part of my evaluation. I have discussed with hospitalist regarding this patient. We agree on care plan. Should patient's clinical status worsen/decline pls re-consult PCCM  . Critical Care Time devoted to patient care services described in this note is 45 Minutes. This time reflects time of care of this signee Dr Newell CoralKristen Daenerys Buttram. This critical care time does not reflect procedure time, or teaching time or supervisory time but could involve care discussion time  DISPOSITION:STEPDOWN CC TIME: 45 MINS PROGNOSIS:GUARDED FAMILY:SON AT BEDSIDE CODE STATUS: FULL   Signed Dr Newell Coral Pulmonary Critical Care [facility] Pager: [phone]  12/20/2017, 1:45 AM

## 2017-12-20 NOTE — ED Notes (Signed)
Hospitalist at the bedside 

## 2017-12-20 NOTE — ED Notes (Signed)
Date and time results received: 12/20/17 0840 (use smartphrase ".now" to insert current time)  Test: Serum Osmolalaity Critical Value: 249  Name of Provider Notified: Gustavus MessingPowell, Michelle, RN  Orders Received? Or Actions Taken?: Actions Taken: Notified Dr Lowell GuitarPowell

## 2017-12-20 NOTE — ED Notes (Signed)
Patient transported to CT 

## 2017-12-20 NOTE — Progress Notes (Signed)
  Echocardiogram 2D Echocardiogram has been performed.  Leta JunglingCooper, Thurlow Gallaga M 12/20/2017, 3:53 PM

## 2017-12-20 NOTE — Progress Notes (Signed)
PROGRESS NOTE    Tammy [name]  ZOX:[id number] DOB: June 29, 1924 DOA: 12/19/2017 PCP: Elias Elseeade, Robert, MD   Brief Narrative: Tammy Fernandez is a 84 y.o. female with medical history significant of HTN, HLD, anxiety/depression, hyponatremia, hypothyroidism; who presents with weakness and abnormal labs.  History is obtained from the patient's son as she is currently altered and only says ok at this time  patient was noted to have constipation for which she had been taking MiraLAX normal and had 2-3 days of diarrhea prior to family no see what was going.  Over the last 2 days patient has been eating and drinking a little better, but was significantly weak and more confused per family.  At this time they note that the diarrhea had stopped.  Associated symptoms include increased lethargy.  She was evaluated by her PCP and lab work was obtained for which it revealed that she was significantly hyponatremic and patient was advised to come to the emergency department for further evaluation.  Patient had been admitted into the hospital with similar symptoms in 11/2015.  Assessment & Plan:   Principal Problem:   Hyponatremia Active Problems:   Essential hypertension   Hypothyroidism   Hyponatremia: Acute on chronic.  Patient presents with initial sodium of 112 admission.  Patient was initially significantly confused and answering okay to questions, but sounds like this worsened overnight.  For me she opens eyes to voice and withdraws from painful stimuli.  Given history of recent diarrhea and low chloride levels suspected hypovolemic hyponatremia, but picture complicated by elevated proBNP and initial CXR with mild CHF.  - Initially on 20 ml/hr hypertonic saline with 250 cc/hr of NS -> transitioned to 20 ml/hr hypertonic saline with concern for volume status.  As pt now with sodium at 118 will stop hypertonic saline.  Start gentle NS and watch volume status closely. [ ]  q2 sodium checks for now   (goal Na at  24 hrs 118-120 ~1919 on 1/18) (goal Na at 48 hrs 124-128 ~1919 on 1/19) [ ]  echo with elevated pro BNP [ ]  repeat CXR - Neurochecks  - Strict intake and output - Check BMPs every 4 hours - Adjust IV fluids as needed - Continue demeclocycline - Consulted PCCM for possible needed of administration of 3% saline for current signs of neurologic decline, will follow-up for further recommendations [ ]  consider nephrology consult  Acute metabolic encephalopathy: Suspect secondary to significant hyponatremia. - Monitoring as noted above - will get head CT  Essential hypertension : Initial blood pressures noted to be intermittently soft. - hold lisinopril, propranolol, and amlodipine - Restart when medically appropriate  Allergies - Continue Claritin, prn Benadryl, and Flonase  Hypothyroidism - Check TSH - Continue levothyroxine  Anxiety - Continue Xanax as needed  Gerd - Continue Protonix  DVT prophylaxis: lovenox Code Status: full  Family Communication: son and daughter Disposition Plan: pending improvement   Consultants:   PCCM  Procedures:   none  Antimicrobials:  Anti-infectives (From admission, onward)   Start     Dose/Rate Route Frequency Ordered Stop   12/19/17 2330  demeclocycline (DECLOMYCIN) tablet 150 mg     150 mg Oral 2 times daily 12/19/17 2322        Subjective: Somnolent  Objective: Vitals:   12/20/17 0200 12/20/17 0203 12/20/17 0319 12/20/17 0632  BP: 116/61 116/61 (!) 125/55 (!) 136/92  Pulse: (!) 54 (!) 52 (!) 57 60  Resp: 15 14 14 16   SpO2: 92% 91% 93% 96%  Weight:      Height:       No intake or output data in the 24 hours ending 12/20/17 0749 Filed Weights   12/20/17 0041  Weight: 81.6 kg (180 lb)    Examination:  General exam: No acute distress, somnolent Respiratory system: Clear to auscultation. Respiratory effort normal. Cardiovascular system: S1 & S2 heard, RRR. No JVD, murmurs, rubs, gallops or clicks. No pedal  edema. Gastrointestinal system: Abdomen is nondistended, soft and nontender. No organomegaly or masses felt. Normal bowel sounds heard. Central nervous system: Opens eyes to voice.  Withdraws from painful stimuli.  Puils symmetric.  Extremities: no LEE, palpable DP pulses Skin: No rashes, lesions or ulcers Psychiatry: Judgement and insight appear normal. Mood & affect appropriate.     Data Reviewed: I have personally reviewed following labs and imaging studies  CBC: Recent Labs  Lab 12/19/17 1912  WBC 6.9  NEUTROABS 5.6  HGB 14.6  HCT 41.5  MCV 86.6  PLT 262   Basic Metabolic Panel: Recent Labs  Lab 12/19/17 1919 12/19/17 2326 12/20/17 0147 12/20/17 0432  NA 112* 110* 108* 112*  K 4.8 4.7  --   --   CL 77* 73*  --   --   CO2 27 30  --   --   GLUCOSE 126* 144*  --   --   BUN 13 15  --   --   CREATININE 0.63 0.76  --   --   CALCIUM 8.1* 8.5*  --   --    GFR: Estimated Creatinine Clearance: 49.3 mL/min (by C-G formula based on SCr of 0.76 mg/dL). Liver Function Tests: Recent Labs  Lab 12/19/17 1919 12/19/17 2326  AST 30 22  ALT 11* 11*  ALKPHOS 72 79  BILITOT 1.1 0.7  PROT 7.0 7.6  ALBUMIN 3.3* 3.5   No results for input(s): LIPASE, AMYLASE in the last 168 hours. No results for input(s): AMMONIA in the last 168 hours. Coagulation Profile: No results for input(s): INR, PROTIME in the last 168 hours. Cardiac Enzymes: No results for input(s): CKTOTAL, CKMB, CKMBINDEX, TROPONINI in the last 168 hours. BNP (last 3 results) No results for input(s): PROBNP in the last 8760 hours. HbA1C: No results for input(s): HGBA1C in the last 72 hours. CBG: No results for input(s): GLUCAP in the last 168 hours. Lipid Profile: No results for input(s): CHOL, HDL, LDLCALC, TRIG, CHOLHDL, LDLDIRECT in the last 72 hours. Thyroid Function Tests: Recent Labs    12/19/17 1840  TSH 1.187   Anemia Panel: No results for input(s): VITAMINB12, FOLATE, FERRITIN, TIBC, IRON,  RETICCTPCT in the last 72 hours. Sepsis Labs: No results for input(s): PROCALCITON, LATICACIDVEN in the last 168 hours.  No results found for this or any previous visit (from the past 240 hour(s)).       Radiology Studies: Dg Chest 2 View  Result Date: 12/19/2017 CLINICAL DATA:  Dyspnea EXAM: CHEST  2 VIEW COMPARISON:  12/19/2017 chest radiograph. FINDINGS: Stable cardiomediastinal silhouette with mild cardiomegaly, prominent main pulmonary artery contour and aortic atherosclerosis. No pneumothorax. Stable prominent eventration of the anterior right hemidiaphragm. No convincing pleural effusion. Stable moderate right basilar atelectasis. Borderline mild pulmonary edema, slightly worsened. IMPRESSION: 1. Borderline mild congestive heart failure, slightly worsened. 2. Stable prominent eventration of the anterior right hemidiaphragm with associated moderate right basilar atelectasis. Electronically Signed   By: Delbert Phenix M.D.   On: 12/19/2017 19:03   Dg Chest 2 View  Result Date: 12/19/2017 CLINICAL DATA:  Cough, congestion, sob EXAM: CHEST  2 VIEW COMPARISON:  12/13/2015 FINDINGS: Shallow lung inflation. Stable elevation of the right hemidiaphragm. The heart is mildly enlarged. There is atherosclerotic calcification of the thoracic aorta. Mild pulmonary vascular congestion without overt edema. No focal consolidations. IMPRESSION: Stable cardiomegaly.  Mild vascular congestion without overt edema. Electronically Signed   By: Norva Pavlov M.D.   On: 12/19/2017 17:51        Scheduled Meds: . aspirin EC  81 mg Oral Daily  . demeclocycline  150 mg Oral BID  . enoxaparin (LOVENOX) injection  40 mg Subcutaneous QHS  . fluticasone  2 spray Each Nare Daily  . levothyroxine  50 mcg Oral QAC breakfast  . loratadine  10 mg Oral Daily  . pantoprazole  40 mg Oral Daily   Continuous Infusions: . sodium chloride 250 mL/hr at 12/20/17 0150  . sodium chloride (hypertonic) 20 mL/hr (12/20/17  0231)     LOS: 1 day    Time spent: over 30 min    Lacretia Nicks, MD [facility] Pager [phone]  If 7PM-7AM, please contact night-coverage [email or web address] Password [facility] 12/20/2017, 7:49 AM

## 2017-12-20 NOTE — ED Notes (Addendum)
CRITICAL VALUE STICKER  CRITICAL VALUE: Sodium 116  RECEIVER (on-site recipient of call): ABrawner,Rn  DATE & TIME NOTIFIED: 12/20/17 @ 1025  MESSENGER (representative from lab)  MD NOTIFIED: Caldwell,MD  TIME OF NOTIFICATION: paged at 1026  RESPONSE: see orders

## 2017-12-20 NOTE — ED Notes (Signed)
Tablet dissolved in patient mouth unable to swallow

## 2017-12-20 NOTE — ED Notes (Signed)
Spoke with IV team, they will place PICC line today.

## 2017-12-20 NOTE — ED Notes (Signed)
Main lab made aware of need for lab draw. Patient is very difficult stick. Lab advised this RN to finger stick patient and obtain specimen. This RN will attempt and call lab back.

## 2017-12-20 NOTE — ED Notes (Addendum)
Main lab called and they will come draw sodium level on patient d/t patient being a difficult stick. Patient's IV does not pull back.

## 2017-12-20 NOTE — ED Notes (Signed)
Phlebotomy at the bedside  

## 2017-12-21 ENCOUNTER — Inpatient Hospital Stay (HOSPITAL_COMMUNITY): Payer: Medicare Other

## 2017-12-21 ENCOUNTER — Encounter (HOSPITAL_COMMUNITY): Payer: Self-pay | Admitting: Radiology

## 2017-12-21 DIAGNOSIS — J9601 Acute respiratory failure with hypoxia: Secondary | ICD-10-CM

## 2017-12-21 LAB — CBC
HCT: 37.8 % (ref 36.0–46.0)
HEMOGLOBIN: 12.9 g/dL (ref 12.0–15.0)
MCH: 30.5 pg (ref 26.0–34.0)
MCHC: 34.1 g/dL (ref 30.0–36.0)
MCV: 89.4 fL (ref 78.0–100.0)
Platelets: 279 10*3/uL (ref 150–400)
RBC: 4.23 MIL/uL (ref 3.87–5.11)
RDW: 13.1 % (ref 11.5–15.5)
WBC: 9.3 10*3/uL (ref 4.0–10.5)

## 2017-12-21 LAB — BASIC METABOLIC PANEL
Anion gap: 6 (ref 5–15)
BUN: 18 mg/dL (ref 6–20)
CALCIUM: 8.5 mg/dL — AB (ref 8.9–10.3)
CO2: 30 mmol/L (ref 22–32)
CREATININE: 0.65 mg/dL (ref 0.44–1.00)
Chloride: 82 mmol/L — ABNORMAL LOW (ref 101–111)
GFR calc Af Amer: >60 mL/min (ref >60)
GLUCOSE: 95 mg/dL (ref 65–99)
Potassium: 4.7 mmol/L (ref 3.5–5.1)
Sodium: 118 mmol/L — CL (ref 135–145)

## 2017-12-21 LAB — SODIUM
SODIUM: 117 mmol/L — AB (ref 135–145)
SODIUM: 119 mmol/L — AB (ref 135–145)
SODIUM: 119 mmol/L — AB (ref 135–145)
Sodium: 116 mmol/L — CL (ref 135–145)
Sodium: 118 mmol/L — CL (ref 135–145)
Sodium: 124 mmol/L — ABNORMAL LOW (ref 135–145)

## 2017-12-21 LAB — NA AND K (SODIUM & POTASSIUM), RAND UR: POTASSIUM UR: 31 mmol/L

## 2017-12-21 MED ORDER — IOPAMIDOL (ISOVUE-370) INJECTION 76%
100.0000 mL | Freq: Once | INTRAVENOUS | Status: AC | PRN
  Administered 2017-12-21: 100 mL via INTRAVENOUS

## 2017-12-21 MED ORDER — IOPAMIDOL (ISOVUE-370) INJECTION 76%
INTRAVENOUS | Status: AC
  Filled 2017-12-21: qty 100

## 2017-12-21 MED ORDER — SODIUM CHLORIDE 3 % IV SOLN
INTRAVENOUS | Status: DC
  Administered 2017-12-21: 30 mL/h via INTRAVENOUS
  Administered 2017-12-21: 20 mL/h via INTRAVENOUS
  Filled 2017-12-21 (×3): qty 500

## 2017-12-21 NOTE — Consult Note (Signed)
[facility] Consult Note     Date: 12/21/2017                  Patient Name:  Tammy Fernandez  MRN: [id number]  DOB: 1924-02-11  Age / Sex: 84 y.o., female         PCP: Maury Dus, MD                 Service Requesting Consult: [facility]- Dr. Florene Glen                 Reason for Consult: hyponatremia            Chief Complaint: abd pain and diarrhea  HPI: Pt is a 47F with a PMH significant for HTN, HLD, GERD, anxiety, and a h/o hyponatremia who is now being seen in consultation at the request of Dr. Florene Glen for eval and recs re: hyponatremia.  Pt initially presented to the ED with abd pain, diarrhea, and vomiting after taking Miralax BID for constipation.  She has associated poor PO intake.    In the ED, she was found to have a Na of 112 and received 1L of NS.  Unfortunately Na went down to 108 after that.  She was then placed on 3% NaCl due to mental status changes and has a rise in her serum sodium to 199.  Rise has been appropriate rate.  She has also received demeclocycline and a one-time dose of Lasix 20 mg on 1/18 for hypoxia and some vascular congestion received on CXR.    BP has not been low.  Urine studies on 1/18 (after 1L IVF) revealed the following: Urine Na < 10, urine osms 375, and serum osms 249.  She has LE edema and JVD.  She is still hypoxic and is requiring O2.  Pt's son tells me that she's had a problem with hyponatremia for years now.  Was on a one bottle of water fluid restriction and 2 bottles of gatorade.  Reports that if she drank a "normal" amount of water which he defines as 8 glasses, her sodium will be low.    Currently, pt's mental status is better.  She can recognize her family members.  Denies pain.  Past Medical History:  Diagnosis Date  . Abnormal heart rhythms   . Anxiety   . Chronic lower back pain   . Depression   . Fatty liver   . GERD (gastroesophageal reflux disease)   . 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 High cholesterol   . Hyperlipidemia   . Hypertension   . Hypothyroidism   . Internal hemorrhoid   . NSTEMI (non-ST elevated myocardial infarction) ([place]) 08/28/2016  . Pneumonia 2016  . Thyroid disease     Past Surgical History:  Procedure Laterality Date  . ABDOMINAL HYSTERECTOMY    . CATARACT EXTRACTION W/ INTRAOCULAR LENS  IMPLANT, BILATERAL Bilateral   . CESAREAN SECTION    . TEE WITHOUT CARDIOVERSION N/A 01/19/2014   Procedure: TRANSESOPHAGEAL ECHOCARDIOGRAM (TEE);  Surgeon: Larey Dresser, MD;  Location: [place];  Service: Cardiovascular;  Laterality: N/A;  [name]  . VAGINAL HYSTERECTOMY      Family History  Problem Relation Age of Onset  . Stroke Father   . Hypertension Father   . Colon cancer Cousin   . Colon cancer Unknown        Aunt  . Diabetes Unknown        aunt  and cousins  . Heart disease Unknown        cousins   Social History:  reports that  has never smoked. She has quit using smokeless tobacco. Her smokeless tobacco use included snuff. She reports that she does not drink alcohol or use drugs.  Allergies:  Allergies  Allergen Reactions  . Apple Flavor Hives    No apple sauce  . Crestor [Rosuvastatin Calcium] Other (See Comments)    Muscle Aches  . Keflex [Cephalexin] Other (See Comments)    dizziness  . Peanut Butter Flavor Hives  . Sulfa Antibiotics Hives  . Cephalexin Other (See Comments)    dizziness  . Crestor [Rosuvastatin Calcium]   . Sulfa Drugs Cross Reactors Rash     (Not in a hospital admission)  Results for orders placed or performed during the hospital encounter of 12/19/17 (from the past 48 hour(s))  Urinalysis, Routine w reflex microscopic     Status: Abnormal   Collection Time: 12/19/17  5:49 PM  Result Value Ref Range   Color, Urine YELLOW YELLOW   APPearance CLEAR CLEAR   Specific Gravity, Urine 1.015 1.005 - 1.030   pH 6.0 5.0 - 8.0   Glucose, UA NEGATIVE NEGATIVE mg/dL   Hgb urine dipstick NEGATIVE  NEGATIVE   Bilirubin Urine NEGATIVE NEGATIVE   Ketones, ur NEGATIVE NEGATIVE mg/dL   Protein, ur 100 (A) NEGATIVE mg/dL   Nitrite NEGATIVE NEGATIVE   Leukocytes, UA NEGATIVE NEGATIVE   RBC / HPF 0-5 0 - 5 RBC/hpf   WBC, UA 0-5 0 - 5 WBC/hpf   Bacteria, UA NONE SEEN NONE SEEN   Squamous Epithelial / LPF 0-5 (A) NONE SEEN   Mucus PRESENT    Hyaline Casts, UA PRESENT   Na and K (sodium & potassium), rand urine     Status: None   Collection Time: 12/19/17  5:49 PM  Result Value Ref Range   Sodium, Ur <10 mmol/L   Potassium Urine 32 mmol/L    Comment: Performed at [facility], [address] [place], [place] [address]  TSH     Status: None   Collection Time: 12/19/17  6:40 PM  Result Value Ref Range   TSH 1.187 0.350 - 4.500 uIU/mL    Comment: Performed by a 3rd Generation assay with a functional sensitivity of <=0.01 uIU/mL.  CBC with Differential/Platelet     Status: None   Collection Time: 12/19/17  7:12 PM  Result Value Ref Range   WBC 6.9 4.0 - 10.5 K/uL   RBC 4.79 3.87 - 5.11 MIL/uL   Hemoglobin 14.6 12.0 - 15.0 g/dL   HCT 41.5 36.0 - 46.0 %   MCV 86.6 78.0 - 100.0 fL   MCH 30.5 26.0 - 34.0 pg   MCHC 35.2 30.0 - 36.0 g/dL   RDW 13.2 11.5 - 15.5 %   Platelets 262 150 - 400 K/uL   Neutrophils Relative % 81 %   Neutro Abs 5.6 1.7 - 7.7 K/uL   Lymphocytes Relative 14 %   Lymphs Abs 0.9 0.7 - 4.0 K/uL   Monocytes Relative 5 %   Monocytes Absolute 0.3 0.1 - 1.0 K/uL   Eosinophils Relative 0 %   Eosinophils Absolute 0.0 0.0 - 0.7 K/uL   Basophils Relative 0 %   Basophils Absolute 0.0 0.0 - 0.1 K/uL  Comprehensive metabolic panel     Status: Abnormal   Collection Time: 12/19/17  7:19 PM  Result Value Ref Range   Sodium  112 (LL) 135 - 145 mmol/L    Comment: REPEATED TO VERIFY CRITICAL RESULT CALLED TO, READ BACK BY AND VERIFIED WITH: OXIDINE,J RN 1.17.19 _0  ZANDOC    Potassium 4.8 3.5 - 5.1 mmol/L   Chloride 77 (L) 101 - 111 mmol/L   CO2 27 22 - 32 mmol/L    Glucose, Bld 126 (H) 65 - 99 mg/dL   BUN 13 6 - 20 mg/dL   Creatinine, Ser 0.63 0.44 - 1.00 mg/dL   Calcium 8.1 (L) 8.9 - 10.3 mg/dL   Total Protein 7.0 6.5 - 8.1 g/dL   Albumin 3.3 (L) 3.5 - 5.0 g/dL   AST 30 15 - 41 U/L   ALT 11 (L) 14 - 54 U/L   Alkaline Phosphatase 72 38 - 126 U/L   Total Bilirubin 1.1 0.3 - 1.2 mg/dL   GFR calc non Af Amer >60 >60 mL/min   GFR calc Af Amer >60 >60 mL/min    Comment: (NOTE) The eGFR has been calculated using the CKD EPI equation. This calculation has not been validated in all clinical situations. eGFR's persistently <60 mL/min signify possible Chronic Kidney Disease.    Anion gap 8 5 - 15  Comprehensive metabolic panel     Status: Abnormal   Collection Time: 12/19/17 11:26 PM  Result Value Ref Range   Sodium 110 (LL) 135 - 145 mmol/L    Comment: CRITICAL RESULT CALLED TO, READ BACK BY AND VERIFIED WITH: OXIDINE,J RN 1.18.19 _1  ZANDO,C    Potassium 4.7 3.5 - 5.1 mmol/L   Chloride 73 (L) 101 - 111 mmol/L   CO2 30 22 - 32 mmol/L   Glucose, Bld 144 (H) 65 - 99 mg/dL   BUN 15 6 - 20 mg/dL   Creatinine, Ser 0.76 0.44 - 1.00 mg/dL   Calcium 8.5 (L) 8.9 - 10.3 mg/dL   Total Protein 7.6 6.5 - 8.1 g/dL   Albumin 3.5 3.5 - 5.0 g/dL   AST 22 15 - 41 U/L   ALT 11 (L) 14 - 54 U/L   Alkaline Phosphatase 79 38 - 126 U/L   Total Bilirubin 0.7 0.3 - 1.2 mg/dL   GFR calc non Af Amer >60 >60 mL/min   GFR calc Af Amer >60 >60 mL/min    Comment: (NOTE) The eGFR has been calculated using the CKD EPI equation. This calculation has not been validated in all clinical situations. eGFR's persistently <60 mL/min signify possible Chronic Kidney Disease.    Anion gap 7 5 - 15  Brain natriuretic peptide     Status: Abnormal   Collection Time: 12/20/17 12:29 AM  Result Value Ref Range   B Natriuretic Peptide 1,214.7 (H) 0.0 - 100.0 pg/mL  Sodium     Status: Abnormal   Collection Time: 12/20/17  1:47 AM  Result Value Ref Range   Sodium 108 (LL) 135 - 145  mmol/L    Comment: CRITICAL RESULT CALLED TO, READ BACK BY AND VERIFIED WITH: MILNER,Q RN 1.18.19 _2  ZANDO,C   Osmolality     Status: Abnormal   Collection Time: 12/20/17  1:47 AM  Result Value Ref Range   Osmolality 249 (LL) 275 - 295 mOsm/kg    Comment: REPEATED TO VERIFY CRITICAL RESULT CALLED TO, READ BACK BY AND VERIFIED WITH: S.CLAPP,RN 0840 12/20/17 CLARK,S Performed at [facility], [name] [address] [place], [place] [address]   Osmolality, urine     Status: None   Collection Time: 12/20/17  1:47 AM  Result  Value Ref Range   Osmolality, Ur 375 300 - 900 mOsm/kg    Comment: Performed at [facility] [address] [place], [place] [address]  Sodium, urine, random     Status: None   Collection Time: 12/20/17  1:47 AM  Result Value Ref Range   Sodium, Ur <10 mmol/L    Comment: Performed at [facility] [address], [place], [name] [address]  Urine rapid drug screen (hosp performed)     Status: Abnormal   Collection Time: 12/20/17  2:01 AM  Result Value Ref Range   Opiates NONE DETECTED NONE DETECTED   Cocaine NONE DETECTED NONE DETECTED   Benzodiazepines POSITIVE (A) NONE DETECTED   Amphetamines NONE DETECTED NONE DETECTED   Tetrahydrocannabinol NONE DETECTED NONE DETECTED   Barbiturates NONE DETECTED NONE DETECTED    Comment: (NOTE) DRUG SCREEN FOR MEDICAL PURPOSES ONLY.  IF CONFIRMATION IS NEEDED FOR ANY PURPOSE, NOTIFY LAB WITHIN 5 DAYS. LOWEST DETECTABLE LIMITS FOR URINE DRUG SCREEN Drug Class                     Cutoff (ng/mL) Amphetamine and metabolites    1000 Barbiturate and metabolites    200 Benzodiazepine                 323 Tricyclics and metabolites     300 Opiates and metabolites        300 Cocaine and metabolites        300 THC                            50   Sodium     Status: Abnormal   Collection Time: 12/20/17  4:32 AM  Result Value Ref Range   Sodium 112 (LL) 135 - 145 mmol/L    Comment: CRITICAL RESULT CALLED TO,  READ BACK BY AND VERIFIED WITH: MILNER,Q RN 1.18.19 _0  ZANDO,C   Sodium     Status: Abnormal   Collection Time: 12/20/17  9:30 AM  Result Value Ref Range   Sodium 116 (LL) 135 - 145 mmol/L    Comment: CRITICAL RESULT CALLED TO, READ BACK BY AND VERIFIED WITH: BRAWNER,N. RN AT 1018 12/20/17 MULLINS,T   Sodium     Status: Abnormal   Collection Time: 12/20/17  1:45 PM  Result Value Ref Range   Sodium 118 (LL) 135 - 145 mmol/L    Comment: CRITICAL RESULT CALLED TO, READ BACK BY AND VERIFIED WITH: DR. Florene Glen @ 1503 ON [id number] BY POTEAT,S   Sodium     Status: Abnormal   Collection Time: 12/20/17  3:45 PM  Result Value Ref Range   Sodium 118 (LL) 135 - 145 mmol/L    Comment: CRITICAL RESULT CALLED TO, READ BACK BY AND VERIFIED WITH: DOSTER,T. RN _1  ON 01.18.19 BY COHEN,K   Sodium     Status: Abnormal   Collection Time: 12/20/17  9:53 PM  Result Value Ref Range   Sodium 118 (LL) 135 - 145 mmol/L    Comment: CRITICAL RESULT CALLED TO, READ BACK BY AND VERIFIED WITH: C CORRIDON,RN [id number] @ 0623 BY J SCOTTON   Sodium     Status: Abnormal   Collection Time: 12/21/17  2:05 AM  Result Value Ref Range   Sodium 116 (LL) 135 - 145 mmol/L    Comment: CRITICAL RESULT CALLED TO, READ BACK BY AND VERIFIED WITH: J NASH,RN 12/21/17 0258 RHOLMES  Sodium     Status: Abnormal   Collection Time: 12/21/17  4:54 AM  Result Value Ref Range   Sodium 118 (LL) 135 - 145 mmol/L    Comment: CRITICAL RESULT CALLED TO, READ BACK BY AND VERIFIED WITH: A MARQUEZ,RN 12/21/17 0552 RHOLMES   Basic metabolic panel     Status: Abnormal   Collection Time: 12/21/17  7:43 AM  Result Value Ref Range   Sodium 118 (LL) 135 - 145 mmol/L    Comment: CRITICAL RESULT CALLED TO, READ BACK BY AND VERIFIED WITH: R.BARHAM,RN 12/21/17 _0  BY V.WILKINS    Potassium 4.7 3.5 - 5.1 mmol/L   Chloride 82 (L) 101 - 111 mmol/L   CO2 30 22 - 32 mmol/L   Glucose, Bld 95 65 - 99 mg/dL   BUN 18 6 - 20 mg/dL   Creatinine,  Ser 0.65 0.44 - 1.00 mg/dL   Calcium 8.5 (L) 8.9 - 10.3 mg/dL   GFR calc non Af Amer >60 >60 mL/min   GFR calc Af Amer >60 >60 mL/min    Comment: (NOTE) The eGFR has been calculated using the CKD EPI equation. This calculation has not been validated in all clinical situations. eGFR's persistently <60 mL/min signify possible Chronic Kidney Disease.    Anion gap 6 5 - 15  CBC     Status: None   Collection Time: 12/21/17  7:43 AM  Result Value Ref Range   WBC 9.3 4.0 - 10.5 K/uL   RBC 4.23 3.87 - 5.11 MIL/uL   Hemoglobin 12.9 12.0 - 15.0 g/dL   HCT 37.8 36.0 - 46.0 %   MCV 89.4 78.0 - 100.0 fL   MCH 30.5 26.0 - 34.0 pg   MCHC 34.1 30.0 - 36.0 g/dL   RDW 13.1 11.5 - 15.5 %   Platelets 279 150 - 400 K/uL  Sodium     Status: Abnormal   Collection Time: 12/21/17  7:45 AM  Result Value Ref Range   Sodium 117 (LL) 135 - 145 mmol/L    Comment: CRITICAL RESULT CALLED TO, READ BACK BY AND VERIFIED WITH: R.BARHAM,RN 12/21/17 _1  BY V.WILKINS   Sodium     Status: Abnormal   Collection Time: 12/21/17  9:07 AM  Result Value Ref Range   Sodium 119 (LL) 135 - 145 mmol/L    Comment: CRITICAL RESULT CALLED TO, READ BACK BY AND VERIFIED WITH: P DOWD,RN 12/21/17 1110 RHOLMES   Sodium     Status: Abnormal   Collection Time: 12/21/17  1:07 PM  Result Value Ref Range   Sodium 119 (LL) 135 - 145 mmol/L    Comment: CRITICAL RESULT CALLED TO, READ BACK BY AND VERIFIED WITH: DOWD,P AT 1:55PM ON 12/21/17 BY [facility]    Dg Chest 2 View  Result Date: 12/19/2017 CLINICAL DATA:  Dyspnea EXAM: CHEST  2 VIEW COMPARISON:  12/19/2017 chest radiograph. FINDINGS: Stable cardiomediastinal silhouette with mild cardiomegaly, prominent main pulmonary artery contour and aortic atherosclerosis. No pneumothorax. Stable prominent eventration of the anterior right hemidiaphragm. No convincing pleural effusion. Stable moderate right basilar atelectasis. Borderline mild pulmonary edema, slightly worsened.  IMPRESSION: 1. Borderline mild congestive heart failure, slightly worsened. 2. Stable prominent eventration of the anterior right hemidiaphragm with associated moderate right basilar atelectasis. Electronically Signed   By: Ilona Sorrel M.D.   On: 12/19/2017 19:03   Ct Head Wo Contrast  Result Date: 12/20/2017 CLINICAL DATA:  84 year old female with altered level of consciousness and weakness. EXAM: CT HEAD WITHOUT CONTRAST TECHNIQUE:  Contiguous axial images were obtained from the base of the skull through the vertex without intravenous contrast. COMPARISON:  12/05/2015 MR and prior studies FINDINGS: Brain: No evidence of acute infarction, hemorrhage, hydrocephalus, extra-axial collection or mass lesion/mass effect. Mild atrophy and chronic small-vessel white matter ischemic changes are again noted. Vascular: Mild atherosclerotic calcifications Skull: Normal. Negative for fracture or focal lesion. Sinuses/Orbits: No acute finding. Other: None. IMPRESSION: 1. No evidence of acute intracranial abnormality 2. Mild atrophy and chronic small-vessel white matter ischemic changes. Electronically Signed   By: Margarette Canada M.D.   On: 12/20/2017 16:42   Ct Angio Chest Pe W Or Wo Contrast  Result Date: 12/21/2017 CLINICAL DATA:  Shortness of breath EXAM: CT ANGIOGRAPHY CHEST WITH CONTRAST TECHNIQUE: Multidetector CT imaging of the chest was performed using the standard protocol during bolus administration of intravenous contrast. Multiplanar CT image reconstructions and MIPs were obtained to evaluate the vascular anatomy. CONTRAST:  173m ISOVUE-370 IOPAMIDOL (ISOVUE-370) INJECTION 76% COMPARISON:  12/16/2006 FINDINGS: Cardiovascular: Good opacification of the pulmonary arteries. There is some mixing artifact and diminished opacification of right lower lobe branches that is likely due to chronic collapse with poor blood flow. No evidence of acute pulmonary embolism. Borderline cardiomegaly with size distorted by  leftward shift of the mediastinum. Enlarged pulmonary artery measuring up to 3.2 cm, as with pulmonary hypertension. Atherosclerotic calcification of the aorta diffusely. Mediastinum/Nodes: Negative for adenopathy or mass. Right upper extremity PICC with tip at the SVC level. Lungs/Pleura: Degree of bronchomalacia with central airway flattening. There is chronic collapse of the right lower lobe. Associated trace right pleural fluid. Few calcified granulomas. There is no edema, consolidation, effusion, or pneumothorax. Upper Abdomen: No acute finding. Musculoskeletal: No acute finding.  Generalized spondylosis. Review of the MIP images confirms the above findings. IMPRESSION: 1. Negative for pulmonary embolism or other acute finding. 2. Chronic elevation of the right diaphragm with right lower lobe collapse. 3. Bronchomalacia. 4.  Aortic Atherosclerosis (ICD10-I70.0). Electronically Signed   By: JMonte FantasiaM.D.   On: 12/21/2017 10:58   Dg Chest Port 1 View  Result Date: 12/21/2017 CLINICAL DATA:  [age]year old female with confusion, weakness, recent cough and shortness of breath. EXAM: PORTABLE CHEST 1 VIEW COMPARISON:  12/20/2017 and earlier. FINDINGS: Portable AP semi upright view at 0733 hours. Right PICC line placed. Chronic moderate to severe elevation of the right hemidiaphragm is only mildly progressed compared to 2,007. Associated right lung base atelectasis. No pneumothorax or pulmonary edema. No pleural effusions suspected. No other confluent pulmonary opacity. Stable cardiac size and mediastinal contours. Calcified aortic atherosclerosis. Visualized tracheal air column is within normal limits. Paucity bowel gas in the upper abdomen. IMPRESSION: 1. Right PICC line placed. 2.  No acute cardiopulmonary abnormality. 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 Electronically Signed   By: HGenevie AnnM.D.   On: 12/21/2017 07:51   Dg  Chest Port 1 View  Result Date: 12/20/2017 CLINICAL DATA:  Hypoxia. EXAM: PORTABLE CHEST 1 VIEW COMPARISON:  Radiographs of December 19, 2017. FINDINGS: Stable cardiomegaly. Atherosclerosis of thoracic aorta is noted. No pneumothorax is noted. Stable elevated right hemidiaphragm is noted. Stable mild bibasilar subsegmental atelectasis is noted. No significant pleural effusion is noted. Bony thorax is unremarkable. IMPRESSION: Mild bibasilar subsegmental atelectasis. Aortic atherosclerosis. Stable elevated right hemidiaphragm. Electronically Signed   By: JMarijo Conception M.D.   On: 12/20/2017 08:47    ROS: all other systems reviewed and are  negative except as per HPI  Blood pressure (!) 146/60, pulse 80, temperature 98.4 F (36.9 C), temperature source Oral, resp. rate 14, height _0  (1.676 m), weight 81.6 kg (180 lb), SpO2 97 %. Physical Exam  GEN elderly woman, lying in bed, NAD HEENT sclerae anicteric NECK + JVD to angle of mandible PULM wearing O2, clear anteriorly, muffled R base, L lung few coarse breath sounds CV RRR, soft systolic murmur ABD obese, soft, nontender, nondistended NABS EXT 1+ LE edema to the mid-shin and some dependent thigh edema as well, 1+ RUE edema NEURO alert to hospital [facility]) but not date.  Recognizes all family members and can answer simple questions.  No seizure activity observed SKIN warm and dry  Assessment/Plan  1.  Hyponatremia: complicated picture of recurring hyponatremia--> last known normal Na 134 which is on the low end of normal.   Urine Na < 10, urine osms 375, and serum osms 249 after 1 L IVF.  She is volume overloaded on exam and not on any fluid pills at home.  Ultimately I think she has underlying hypervolemic hyponatremia which was being somewhat treated by an unintentional fluid restriction (1 bottle of water and 2 bottles of gatorade).  When she became ill with vomiting/diarrhea she had the release of ADH which compounded her hyponatremia  problem and resulted in a mixed hypervolemic hyponatremia and SIADH.  She does not appear volume deplete on exam and by TTE; this mixed picture is likely why her sodium got worse with the administration of isotonic fluids.  Therefore, I recommend the following:  - continuation of hypertonic saline (3%) until Na is certainly > 381 and really closer to 130.  3% NaCl going at 50 mL/ hr although order is for 20 mL/ hr.  Her Na has been stable at 119 over the past 4 hours.  Next recheck pending. - I am stopping the order for isotonic fluids at 50 mL/ hr - continue q 4 Na checks - stop demeclocycline - would ideally use IV Lasix to augment free water excretion but would wait until we see effects of stopping demeclocycline and having NaCl 3% at 50 mL/ hr. - will need heart failure treated to prevent recurrent hyponatremia.   Madelon Lips MD [facility] pgr [phone] 12/21/2017, 2:50 PM

## 2017-12-21 NOTE — Assessment & Plan Note (Signed)
Improving with correction in Na

## 2017-12-21 NOTE — Assessment & Plan Note (Signed)
New 2L [place] need. Associated diast dysfn and elevated PASP on echo. Also elevated diaphragm and bronchomalacia  Plan - check autoimmune - recommend symptom based Rx. Appears multifactorial - very little for active intervention

## 2017-12-21 NOTE — Progress Notes (Addendum)
PROGRESS NOTE    Tammy Fernandez  ZOX:[id number] DOB: Aug 13, 1924 DOA: 12/19/2017 PCP: Tammy Fernandez, Robert, MD   Brief Narrative: Tammy Fernandez is Tammy Fernandez 84 y.o. female with medical history significant of HTN, HLD, anxiety/depression, hyponatremia, hypothyroidism; who presents with weakness and abnormal labs.  History is obtained from the patient's son as she is currently altered and only says ok at this time  patient was noted to have constipation for which she had been taking MiraLAX normal and had 2-3 days of diarrhea prior to family no see what was going.  Over the last 2 days patient has been eating and drinking Malic Rosten little better, but was significantly weak and more confused per family.  At this time they note that the diarrhea had stopped.  Associated symptoms include increased lethargy.  She was evaluated by her PCP and lab work was obtained for which it revealed that she was significantly hyponatremic and patient was advised to come to the emergency department for further evaluation.  Patient had been admitted into the hospital with similar symptoms in 11/2015.  Assessment & Plan:   Principal Problem:   Hyponatremia Active Problems:   Essential hypertension   Hypothyroidism   Hyponatremia: Acute on chronic.  Patient presents with initial sodium of 112 admission.  Patient was initially significantly confused and answering okay to questions, this worsened in the AM of 1/18, but was improved 1/18 PM and 1/19 this morning.  Given history of recent diarrhea and low chloride levels suspected hypovolemic hyponatremia, but picture complicated by elevated proBNP and initial CXR with mild CHF.  - Initially on 20 ml/hr hypertonic saline with 250 cc/hr of NS -> transitioned to 20 ml/hr hypertonic saline with concern for volume status.  As pt now with sodium at 118 will stop hypertonic saline.  Start gentle NS and watch volume status closely. - Overnight sodiums relatively stable on NS, received dose of lasix x1  with elevated BNP and edema on initial CXR - will restart hypertonic at slow rate given concern for volume, continue to monitor, she's improved from Emmary Culbreath mental status standpoint.  [ ]  will consult renal for assistance with difficult volume status [ ]  frequent sodium checks   (goal Na at 48 hrs 124-126 ~1919 on 1/19) [ ]  echo as below [ ]  repeat CXR - Neurochecks  - Strict intake and output - Check BMPs every 4 hours - Adjust IV fluids as needed - Continue demeclocycline - Consulted PCCM for possible needed of administration of 3% saline for current signs of neurologic decline, will follow-up for further recommendations  Hypoxia: possibly 2/2 volume overload?  Repeat imaging without edema.  CTA negative for PE, ctm.   Severe Pulmonary Hypertension:  - CTA to r/o PE with hypoxia - Will c/s pulm with negative study  Acute metabolic encephalopathy: Suspect secondary to significant hyponatremia, currently improved. . - Monitoring as noted above - will get head CT (no acute abnormality) - speech eval pending  Essential hypertension : Initial blood pressures noted to be intermittently soft. - hold lisinopril, propranolol, and amlodipine - Restart when medically appropriate  Allergies - Continue Claritin, prn Benadryl, and Flonase  Hypothyroidism - Check TSH - Continue levothyroxine  Anxiety - Continue Xanax as needed  Gerd - Continue Protonix  DVT prophylaxis: lovenox Code Status: full  Family Communication: son and daughter Disposition Plan: pending improvement   Consultants:   PCCM  Procedures:   none  Antimicrobials:  Anti-infectives (From admission, onward)   Start  Dose/Rate Route Frequency Ordered Stop   12/19/17 2330  demeclocycline (DECLOMYCIN) tablet 150 mg     150 mg Oral 2 times daily 12/19/17 2322        Subjective: Awake.  Ulice Follett&Ox2 (didn't know location) Denies pain.   Objective: Vitals:   12/21/17 1000 12/21/17 1048 12/21/17 1130  12/21/17 1300  BP: (!) 142/59 (!) 146/61 (!) 135/50 140/62  Pulse: 75 91 76 80  Resp: 14 15 12 13   Temp:      TempSrc:      SpO2: 96% 95% 96% 97%  Weight:      Height:        Intake/Output Summary (Last 24 hours) at 12/21/2017 1313 Last data filed at 12/21/2017 1046 Gross per 24 hour  Intake 10 ml  Output -  Net 10 ml   Filed Weights   12/20/17 0041  Weight: 81.6 kg (180 lb)    Examination:  General: No acute distress.  Improved mental status Cardiovascular: Heart sounds show Earleen Aoun regular rate, and rhythm. No gallops or rubs. No murmurs. No JVD. Lungs: Clear to auscultation bilaterally with good air movement. No rales, rhonchi or wheezes. Abdomen: Soft, nontender, nondistended with normal active bowel sounds. No masses. No hepatosplenomegaly. Neurological: Alert and oriented 2. Moves all extremities 4. Cranial nerves II through XII grossly intact. Skin: Warm and dry. No rashes or lesions. Extremities: No clubbing or cyanosis. No edema.    Data Reviewed: I have personally reviewed following labs and imaging studies  CBC: Recent Labs  Lab 12/19/17 1912 12/21/17 0743  WBC 6.9 9.3  NEUTROABS 5.6  --   HGB 14.6 12.9  HCT 41.5 37.8  MCV 86.6 89.4  PLT 262 279   Basic Metabolic Panel: Recent Labs  Lab 12/19/17 1919 12/19/17 2326  12/21/17 0205 12/21/17 0454 12/21/17 0743 12/21/17 0745 12/21/17 0907  NA 112* 110*   < > 116* 118* 118* 117* 119*  K 4.8 4.7  --   --   --  4.7  --   --   CL 77* 73*  --   --   --  82*  --   --   CO2 27 30  --   --   --  30  --   --   GLUCOSE 126* 144*  --   --   --  95  --   --   BUN 13 15  --   --   --  18  --   --   CREATININE 0.63 0.76  --   --   --  0.65  --   --   CALCIUM 8.1* 8.5*  --   --   --  8.5*  --   --    < > = values in this interval not displayed.   GFR: Estimated Creatinine Clearance: 49.3 mL/min (by C-G formula based on SCr of 0.65 mg/dL). Liver Function Tests: Recent Labs  Lab 12/19/17 1919 12/19/17 2326    AST 30 22  ALT 11* 11*  ALKPHOS 72 79  BILITOT 1.1 0.7  PROT 7.0 7.6  ALBUMIN 3.3* 3.5   No results for input(s): LIPASE, AMYLASE in the last 168 hours. No results for input(s): AMMONIA in the last 168 hours. Coagulation Profile: No results for input(s): INR, PROTIME in the last 168 hours. Cardiac Enzymes: No results for input(s): CKTOTAL, CKMB, CKMBINDEX, TROPONINI in the last 168 hours. BNP (last 3 results) No results for input(s): PROBNP in the last 8760 hours.  HbA1C: No results for input(s): HGBA1C in the last 72 hours. CBG: No results for input(s): GLUCAP in the last 168 hours. Lipid Profile: No results for input(s): CHOL, HDL, LDLCALC, TRIG, CHOLHDL, LDLDIRECT in the last 72 hours. Thyroid Function Tests: Recent Labs    12/19/17 1840  TSH 1.187   Anemia Panel: No results for input(s): VITAMINB12, FOLATE, FERRITIN, TIBC, IRON, RETICCTPCT in the last 72 hours. Sepsis Labs: No results for input(s): PROCALCITON, LATICACIDVEN in the last 168 hours.  No results found for this or any previous visit (from the past 240 hour(s)).       Radiology Studies: Dg Chest 2 View  Result Date: 12/19/2017 CLINICAL DATA:  Dyspnea EXAM: CHEST  2 VIEW COMPARISON:  12/19/2017 chest radiograph. FINDINGS: Stable cardiomediastinal silhouette with mild cardiomegaly, prominent main pulmonary artery contour and aortic atherosclerosis. No pneumothorax. Stable prominent eventration of the anterior right hemidiaphragm. No convincing pleural effusion. Stable moderate right basilar atelectasis. Borderline mild pulmonary edema, slightly worsened. IMPRESSION: 1. Borderline mild congestive heart failure, slightly worsened. 2. Stable prominent eventration of the anterior right hemidiaphragm with associated moderate right basilar atelectasis. Electronically Signed   By: Delbert Phenix M.D.   On: 12/19/2017 19:03   Ct Head Wo Contrast  Result Date: 12/20/2017 CLINICAL DATA:  84 year old female with altered  level of consciousness and weakness. EXAM: CT HEAD WITHOUT CONTRAST TECHNIQUE: Contiguous axial images were obtained from the base of the skull through the vertex without intravenous contrast. COMPARISON:  12/05/2015 MR and prior studies FINDINGS: Brain: No evidence of acute infarction, hemorrhage, hydrocephalus, extra-axial collection or mass lesion/mass effect. Mild atrophy and chronic small-vessel white matter ischemic changes are again noted. Vascular: Mild atherosclerotic calcifications Skull: Normal. Negative for fracture or focal lesion. Sinuses/Orbits: No acute finding. Other: None. IMPRESSION: 1. No evidence of acute intracranial abnormality 2. Mild atrophy and chronic small-vessel white matter ischemic changes. Electronically Signed   By: Harmon Pier M.D.   On: 12/20/2017 16:42   Ct Angio Chest Pe W Or Wo Contrast  Result Date: 12/21/2017 CLINICAL DATA:  Shortness of breath EXAM: CT ANGIOGRAPHY CHEST WITH CONTRAST TECHNIQUE: Multidetector CT imaging of the chest was performed using the standard protocol during bolus administration of intravenous contrast. Multiplanar CT image reconstructions and MIPs were obtained to evaluate the vascular anatomy. CONTRAST:  ISOVUE-370 IOPAMIDOL (ISOVUE-370) INJECTION 76% COMPARISON:  12/16/2006 FINDINGS: Cardiovascular: Good opacification of the pulmonary arteries. There is some mixing artifact and diminished opacification of right lower lobe branches that is likely due to chronic collapse with poor blood flow. No evidence of acute pulmonary embolism. Borderline cardiomegaly with size distorted by leftward shift of the mediastinum. Enlarged pulmonary artery measuring up to 3.2 cm, as with pulmonary hypertension. Atherosclerotic calcification of the aorta diffusely. Mediastinum/Nodes: Negative for adenopathy or mass. Right upper extremity PICC with tip at the SVC level. Lungs/Pleura: Degree of bronchomalacia with central airway flattening. There is chronic  collapse of the right lower lobe. Associated trace right pleural fluid. Few calcified granulomas. There is no edema, consolidation, effusion, or pneumothorax. Upper Abdomen: No acute finding. Musculoskeletal: No acute finding.  Generalized spondylosis. Review of the MIP images confirms the above findings. IMPRESSION: 1. Negative for pulmonary embolism or other acute finding. 2. Chronic elevation of the right diaphragm with right lower lobe collapse. 3. Bronchomalacia. 4.  Aortic Atherosclerosis (ICD10-I70.0). Electronically Signed   By: Marnee Spring M.D.   On: 12/21/2017 10:58   Dg Chest Port 1 View  Result Date: 12/21/2017 CLINICAL DATA:  84 year old female with confusion, weakness, recent cough and shortness of breath. EXAM: PORTABLE CHEST 1 VIEW COMPARISON:  12/20/2017 and earlier. FINDINGS: Portable AP semi upright view at 0733 hours. Right PICC line placed. Chronic moderate to severe elevation of the right hemidiaphragm is only mildly progressed compared to 2,007. Associated right lung base atelectasis. No pneumothorax or pulmonary edema. No pleural effusions suspected. No other confluent pulmonary opacity. Stable cardiac size and mediastinal contours. Calcified aortic atherosclerosis. Visualized tracheal air column is within normal limits. Paucity bowel gas in the upper abdomen. IMPRESSION: 1. Right PICC line placed. 2.  No acute cardiopulmonary abnormality. 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 Electronically Signed   By: Odessa Fleming M.D.   On: 12/21/2017 07:51   Dg Chest Port 1 View  Result Date: 12/20/2017 CLINICAL DATA:  Hypoxia. EXAM: PORTABLE CHEST 1 VIEW COMPARISON:  Radiographs of December 19, 2017. FINDINGS: Stable cardiomegaly. Atherosclerosis of thoracic aorta is noted. No pneumothorax is noted. Stable elevated right hemidiaphragm is noted. Stable mild bibasilar subsegmental atelectasis is noted. No significant  pleural effusion is noted. Bony thorax is unremarkable. IMPRESSION: Mild bibasilar subsegmental atelectasis. Aortic atherosclerosis. Stable elevated right hemidiaphragm. Electronically Signed   By: Lupita Raider, M.D.   On: 12/20/2017 08:47        Scheduled Meds: . aspirin EC  81 mg Oral Daily  . demeclocycline  150 mg Oral BID  . enoxaparin (LOVENOX) injection  40 mg Subcutaneous QHS  . fluticasone  2 spray Each Nare Daily  . iopamidol      . levothyroxine  50 mcg Oral QAC breakfast  . loratadine  10 mg Oral Daily  . pantoprazole  40 mg Oral Daily  . sodium chloride flush  10-40 mL Intracatheter Q12H   Continuous Infusions: . sodium chloride 50 mL/hr at 12/20/17 1553  . sodium chloride (hypertonic) 20 mL/hr (12/21/17 0912)     LOS: 2 days    Time spent: over 30 min    Lacretia Nicks, MD [facility] Pager [phone]  If 7PM-7AM, please contact night-coverage [email or web address] Password [email or web address] 12/21/2017, 1:13 PM

## 2017-12-21 NOTE — Evaluation (Signed)
Clinical/Bedside Swallow Evaluation Patient Details  Name: Tammy Fernandez MRN: [id number] Date of Birth: 05-27-1924  Today's Date: 12/21/2017 Time: SLP Start Time (ACUTE ONLY): 1455 SLP Stop Time (ACUTE ONLY): 1520 SLP Time Calculation (min) (ACUTE ONLY): 25 min  Past Medical History:  Past Medical History:  Diagnosis Date  . Abnormal heart rhythms   . Anxiety   . Chronic lower back pain   . Depression   . Fatty liver   . GERD (gastroesophageal reflux disease)   . 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 High cholesterol   . Hyperlipidemia   . Hypertension   . Hypothyroidism   . Internal hemorrhoid   . NSTEMI (non-ST elevated myocardial infarction) (HCC) 08/28/2016  . Pneumonia 2016  . Thyroid disease    Past Surgical History:  Past Surgical History:  Procedure Laterality Date  . ABDOMINAL HYSTERECTOMY    . CATARACT EXTRACTION W/ INTRAOCULAR LENS  IMPLANT, BILATERAL Bilateral   . CESAREAN SECTION    . TEE WITHOUT CARDIOVERSION N/A 01/19/2014   Procedure: TRANSESOPHAGEAL ECHOCARDIOGRAM (TEE);  Surgeon: Laurey Morale, MD;  Location: [facility] ENDOSCOPY;  Service: Cardiovascular;  Laterality: N/A;  Tammy Fernandez  . VAGINAL HYSTERECTOMY     HPI:  84 y.o. female with medical history significant of HTN, HLD, anxiety/depression, hyponatremia, hypothyroidism; who presents with weakness and abnormal labs.  History is obtained from the patient's son as she is currently altered and only says ok at this time  patient was noted to have constipation for which she had been taking MiraLAX normal and had 2-3 days of diarrhea prior to family no see what was going.  Over the last 2 days patient has been eating and drinking a little better, but was significantly weak and more confused per family.  At this time they note that the diarrhea had stopped.  Associated symptoms include increased lethargy.  She was evaluated by her PCP and lab work was obtained for which it revealed that she was  significantly hyponatremic and patient was advised to come to the emergency department for further evaluation ; pt failed pre-swallow screen in ED and BSE ordered  Assessment / Plan / Recommendation Clinical Impression   Pt exhibits oropharyngeal dysphagia characterized by oral holding, decreased oral propulsion and delay in the initiation of the swallow with puree/solids likely due to impaired cognition/LOA; no overt s/s of aspiration noted during BSE, but RR decreased at one point in the evaluation and pt was then reclined from 90 degrees to 45 degrees upright and RR returned to normal setting d/t possible increased diaphragmatic pressure; recommend pt consume either regular or mechanical/soft diet texture consistency (per family report of pt consuming primarily softer foods/tender meats at home) with thin liquids; ST will f/u for diet tolerance and education re: swallowing safety/aspiration precautions during PO intake. SLP Visit Diagnosis: Dysphagia, oropharyngeal phase (R13.12)    Aspiration Risk  Mild aspiration risk    Diet Recommendation   Regular/thin (as tolerated); may consider mechanical/soft prn   Medication Administration: Whole meds with puree    Other  Recommendations Oral Care Recommendations: Oral care BID   Follow up Recommendations Other (comment)(TBD)      Frequency and Duration min 1 x/week  1 week       Prognosis Prognosis for Safe Diet Advancement: Good      Swallow Study   General Date of Onset: 12/19/17 HPI: 84 y.o. female with medical history significant of HTN, HLD, anxiety/depression, hyponatremia, hypothyroidism; who  presents with weakness and abnormal labs.  History is obtained from the patient's son as she is currently altered and only says ok at this time  patient was noted to have constipation for which she had been taking MiraLAX normal and had 2-3 days of diarrhea prior to family no see what was going.  Over the last 2 days patient has been eating and  drinking a little better, but was significantly weak and more confused per family.  At this time they note that the diarrhea had stopped.  Associated symptoms include increased lethargy.  She was evaluated by her PCP and lab work was obtained for which it revealed that she was significantly hyponatremic and patient was advised to come to the emergency department for further evaluation Type of Study: Bedside Swallow Evaluation Previous Swallow Assessment: (pre-swallow screen failed) Diet Prior to this Study: Regular;Thin liquids Temperature Spikes Noted: No Respiratory Status: Nasal cannula History of Recent Intubation: No Behavior/Cognition: Cooperative;Lethargic/Drowsy Oral Cavity Assessment: Within Functional Limits Oral Care Completed by SLP: Recent completion by staff Oral Cavity - Dentition: Poor condition;Missing dentition Vision: Functional for self-feeding Self-Feeding Abilities: Able to feed self;Needs assist Patient Positioning: Upright in bed Baseline Vocal Quality: Low vocal intensity Volitional Cough: Strong Volitional Swallow: Able to elicit    Oral/Motor/Sensory Function Overall Oral Motor/Sensory Function: Within functional limits   Ice Chips Ice chips: Within functional limits Presentation: Spoon   Thin Liquid Thin Liquid: Within functional limits Presentation: Cup;Straw    Nectar Thick Nectar Thick Liquid: Not tested   Honey Thick Honey Thick Liquid: Not tested   Puree Puree: Impaired Presentation: Spoon Oral Phase Functional Implications: Oral holding   Solid      Solid: Impaired Presentation: Self Fed Oral Phase Impairments: Impaired mastication Oral Phase Functional Implications: Impaired mastication;Oral holding Pharyngeal Phase Impairments: Suspected delayed Swallow        Tammy Fernandez, M.S., CCC-SLP 12/21/2017,3:51 PM

## 2017-12-21 NOTE — Assessment & Plan Note (Signed)
9 point Na rise in nearly 48h with hypertonic saline with improvement in delriium. Might be hypervoliemic hyponatermia (baseline 134 in sept 2017). Currently Na 119  Plan Per renal - being consulted

## 2017-12-21 NOTE — Consult Note (Signed)
.. ..  Name: Tammy Fernandez MRN: [id number] DOB: 1924/04/14    ADMISSION DATE:  12/19/2017 CONSULTATION DATE:  12/20/17  REFERRING MD :  Madelyn Flavors MD  CHIEF COMPLAINT:  DIARRHEA AND VOMITTING  BRIEF PATIENT DESCRIPTION: 84 YR OLD FEMALE WITH PMHX HTN, HLD, ANXIETY, HYPONATREMIA,HYPOTHYROIDISM PRESENTED CONFUSED WITH NA+ 112.  STUDIES:  CXR  BRIEF - 84 yr old female with Pmhx of Arrhythmias, Anxiety, Chronic lower back pain, Depression, Fatty liver, GERD, High cholesterol, Hyperlipidemia, Hypertension, Hypothyroidism, Internal hemorrhoid, NSTEMI, Pneumonia, and previous episode of hyponatremia presented with confusion after having diarrhea at home and becoming dehydrated. Her son who is the source of this history states that she took double doses of Miralax to treat her constipation which resulted in 2-3 day history of diarrhea. Over the last 48 hrs she has had poor appetite and little fluid intake. She was seen by her PCP who noted that her labs showed a low sodium and asked her to come to the ED for evaluation. PCCM was consulted by hospitalist for assistance in hyponatremic mgmt with 3%   SUBJECTIVE:  12/21/2017 - on 2L [place]. Desaturated to 86% on RA.  On hypertonic saline. Been in ER since 12/19/2017  5:36 PM without bed due to lack of sDY. Daughter is frustrated by this. Does report Na better and witht his confusion better (at home walks with walker and alert)  VITAL SIGNS: Temp:  [98.4 F (36.9 C)] 98.4 F (36.9 C) (01/18 2033) Pulse Rate:  [67-101] 80 (01/19 1300) Resp:  [10-20] 13 (01/19 1300) BP: (111-147)/(50-88) 140/62 (01/19 1300) SpO2:  [87 %-99 %] 97 % (01/19 1300)  PHYSICAL EXAMINATION:  General Appearance:    Looks chronic unwell  Head:    Normocephalic, without obvious abnormality, atraumatic  Eyes:    PERRL - yes, conjunctiva/corneas - clear      Ears:    Normal external ear canals, both ears  Nose:   NG tube - no but has   Throat:  ETT TUBE - no , OG tube -  no  Neck:   Supple,  No enlargement/tenderness/nodules     Lungs:     Clear to auscultation bilaterally  Chest wall:    No deformity  Heart:    S1 and S2 normal, no murmur, CVP - no.  Pressors - no  Abdomen:     Soft, no masses, no organomegaly  Genitalia:    Not done  Rectal:   not done  Extremities:   Extremities- edema ++     Skin:   Intact in exposed areas .      Neurologic:   Sedation - none -> RASS - 0 . Moves all 4s - yes. CAM-ICU - not asessed . Orientation - awkoke and said  hello     PULMONARY No results for input(s): PHART, PCO2ART, PO2ART, HCO3, TCO2, O2SAT in the last 168 hours.  Invalid input(s): PCO2, PO2  CBC Recent Labs  Lab 12/19/17 1912 12/21/17 0743  HGB 14.6 12.9  HCT 41.5 37.8  WBC 6.9 9.3  PLT 262 279    COAGULATION No results for input(s): INR in the last 168 hours.  CARDIAC  No results for input(s): TROPONINI in the last 168 hours. No results for input(s): PROBNP in the last 168 hours.   CHEMISTRY Recent Labs  Lab 12/19/17 1919 12/19/17 2326  12/21/17 0454 12/21/17 0743 12/21/17 0745 12/21/17 0907 12/21/17 1307  NA 112* 110*   < > 118* 118* 117* 119* 119*  K  4.8 4.7  --   --  4.7  --   --   --   CL 77* 73*  --   --  82*  --   --   --   CO2 27 30  --   --  30  --   --   --   GLUCOSE 126* 144*  --   --  95  --   --   --   BUN 13 15  --   --  18  --   --   --   CREATININE 0.63 0.76  --   --  0.65  --   --   --   CALCIUM 8.1* 8.5*  --   --  8.5*  --   --   --    < > = values in this interval not displayed.   Estimated Creatinine Clearance: 49.3 mL/min (by C-G formula based on SCr of 0.65 mg/dL).   LIVER Recent Labs  Lab 12/19/17 1919 12/19/17 2326  AST 30 22  ALT 11* 11*  ALKPHOS 72 79  BILITOT 1.1 0.7  PROT 7.0 7.6  ALBUMIN 3.3* 3.5     INFECTIOUS No results for input(s): LATICACIDVEN, PROCALCITON in the last 168 hours.   ENDOCRINE CBG (last 3)  No results for input(s): GLUCAP in the last 72  hours.       IMAGING x48h  - image(s) personally visualized  -   highlighted in bold Dg Chest 2 View  Result Date: 12/19/2017 CLINICAL DATA:  Dyspnea EXAM: CHEST  2 VIEW COMPARISON:  12/19/2017 chest radiograph. FINDINGS: Stable cardiomediastinal silhouette with mild cardiomegaly, prominent main pulmonary artery contour and aortic atherosclerosis. No pneumothorax. Stable prominent eventration of the anterior right hemidiaphragm. No convincing pleural effusion. Stable moderate right basilar atelectasis. Borderline mild pulmonary edema, slightly worsened. IMPRESSION: 1. Borderline mild congestive heart failure, slightly worsened. 2. Stable prominent eventration of the anterior right hemidiaphragm with associated moderate right basilar atelectasis. Electronically Signed   By: Delbert PhenixJason A Poff M.D.   On: 12/19/2017 19:03   Ct Head Wo Contrast  Result Date: 12/20/2017 CLINICAL DATA:  84 year old female with altered level of consciousness and weakness. EXAM: CT HEAD WITHOUT CONTRAST TECHNIQUE: Contiguous axial images were obtained from the base of the skull through the vertex without intravenous contrast. COMPARISON:  12/05/2015 MR and prior studies FINDINGS: Brain: No evidence of acute infarction, hemorrhage, hydrocephalus, extra-axial collection or mass lesion/mass effect. Mild atrophy and chronic small-vessel white matter ischemic changes are again noted. Vascular: Mild atherosclerotic calcifications Skull: Normal. Negative for fracture or focal lesion. Sinuses/Orbits: No acute finding. Other: None. IMPRESSION: 1. No evidence of acute intracranial abnormality 2. Mild atrophy and chronic small-vessel white matter ischemic changes. Electronically Signed   By: Harmon PierJeffrey  Hu M.D.   On: 12/20/2017 16:42   Ct Angio Chest Pe W Or Wo Contrast  Result Date: 12/21/2017 CLINICAL DATA:  Shortness of breath EXAM: CT ANGIOGRAPHY CHEST WITH CONTRAST TECHNIQUE: Multidetector CT imaging of the chest was performed using the  standard protocol during bolus administration of intravenous contrast. Multiplanar CT image reconstructions and MIPs were obtained to evaluate the vascular anatomy. CONTRAST:  100mL ISOVUE-370 IOPAMIDOL (ISOVUE-370) INJECTION 76% COMPARISON:  12/16/2006 FINDINGS: Cardiovascular: Good opacification of the pulmonary arteries. There is some mixing artifact and diminished opacification of right lower lobe branches that is likely due to chronic collapse with poor blood flow. No evidence of acute pulmonary embolism. Borderline cardiomegaly with size distorted by leftward shift of  the mediastinum. Enlarged pulmonary artery measuring up to 3.2 cm, as with pulmonary hypertension. Atherosclerotic calcification of the aorta diffusely. Mediastinum/Nodes: Negative for adenopathy or mass. Right upper extremity PICC with tip at the SVC level. Lungs/Pleura: Degree of bronchomalacia with central airway flattening. There is chronic collapse of the right lower lobe. Associated trace right pleural fluid. Few calcified granulomas. There is no edema, consolidation, effusion, or pneumothorax. Upper Abdomen: No acute finding. Musculoskeletal: No acute finding.  Generalized spondylosis. Review of the MIP images confirms the above findings. IMPRESSION: 1. Negative for pulmonary embolism or other acute finding. 2. Chronic elevation of the right diaphragm with right lower lobe collapse. 3. Bronchomalacia. 4.  Aortic Atherosclerosis (ICD10-I70.0). Electronically Signed   By: Marnee Spring M.D.   On: 12/21/2017 10:58   Dg Chest Port 1 View  Result Date: 12/21/2017 CLINICAL DATA:  84 year old female with confusion, weakness, recent cough and shortness of breath. EXAM: PORTABLE CHEST 1 VIEW COMPARISON:  12/20/2017 and earlier. FINDINGS: Portable AP semi upright view at 0733 hours. Right PICC line placed. Chronic moderate to severe elevation of the right hemidiaphragm is only mildly progressed compared to 2,007. Associated right lung base  atelectasis. No pneumothorax or pulmonary edema. No pleural effusions suspected. No other confluent pulmonary opacity. Stable cardiac size and mediastinal contours. Calcified aortic atherosclerosis. Visualized tracheal air column is within normal limits. Paucity bowel gas in the upper abdomen. IMPRESSION: 1. Right PICC line placed. 2.  No acute cardiopulmonary abnormality. 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 Electronically Signed   By: Odessa Fleming M.D.   On: 12/21/2017 07:51   Dg Chest Port 1 View  Result Date: 12/20/2017 CLINICAL DATA:  Hypoxia. EXAM: PORTABLE CHEST 1 VIEW COMPARISON:  Radiographs of December 19, 2017. FINDINGS: Stable cardiomegaly. Atherosclerosis of thoracic aorta is noted. No pneumothorax is noted. Stable elevated right hemidiaphragm is noted. Stable mild bibasilar subsegmental atelectasis is noted. No significant pleural effusion is noted. Bony thorax is unremarkable. IMPRESSION: Mild bibasilar subsegmental atelectasis. Aortic atherosclerosis. Stable elevated right hemidiaphragm. Electronically Signed   By: Lupita Raider, M.D.   On: 12/20/2017 08:47    Acute hyponatremia 9 point Na rise in nearly 48h with hypertonic saline with improvement in delriium. Might be hypervoliemic hyponatermia (baseline 134 in sept 2017). Currently Na 119  Plan Per renal - being consulted   Acute encephalopathy Improving with correction in Na  Acute respiratory failure with hypoxia (HCC) New 2L [place] need. Associated diast dysfn and elevated PASP on echo. Also elevated diaphragm and bronchomalacia  Plan - check autoimmune - recommend symptom based Rx. Appears multifactorial - very little for active intervention    DISPO Ok to floor instead of SDU  Ccm will sign off   D.w DR Grier Mitts  Dr. Kalman Shan, M.D., [facility].C.P Pulmonary and Critical Care Medicine Staff Physician, [facility] Center Director - Interstitial Lung Disease  Program  Pulmonary Fibrosis [facility] at [facility] [place], [place], [address]  Pager: [phone], If no answer or between  15:00h - 7:00h: call [phone] Telephone: [phone]

## 2017-12-21 NOTE — ED Notes (Signed)
Date and time results received: 12/21/17 0259  Test: NA Critical Value: 116  Name of Provider Notified:   Orders Received? Or Actions Taken?: Action Taken

## 2017-12-22 LAB — BASIC METABOLIC PANEL
Anion gap: 7 (ref 5–15)
BUN: 10 mg/dL (ref 6–20)
CHLORIDE: 90 mmol/L — AB (ref 101–111)
CO2: 29 mmol/L (ref 22–32)
CREATININE: 0.52 mg/dL (ref 0.44–1.00)
Calcium: 8.3 mg/dL — ABNORMAL LOW (ref 8.9–10.3)
GFR calc non Af Amer: >60 mL/min (ref >60)
Glucose, Bld: 75 mg/dL (ref 65–99)
Potassium: 4 mmol/L (ref 3.5–5.1)
Sodium: 126 mmol/L — ABNORMAL LOW (ref 135–145)

## 2017-12-22 LAB — SODIUM
SODIUM: 124 mmol/L — AB (ref 135–145)
Sodium: 126 mmol/L — ABNORMAL LOW (ref 135–145)
Sodium: 127 mmol/L — ABNORMAL LOW (ref 135–145)

## 2017-12-22 LAB — TROPONIN I: Troponin I: 0.04 ng/mL (ref <0.03)

## 2017-12-22 LAB — LACTIC ACID, PLASMA: Lactic Acid, Venous: 0.7 mmol/L (ref 0.5–1.9)

## 2017-12-22 LAB — RHEUMATOID FACTOR: RHEUMATOID FACTOR: 29.1 [IU]/mL — AB (ref 0.0–13.9)

## 2017-12-22 MED ORDER — FUROSEMIDE 40 MG PO TABS
40.0000 mg | ORAL_TABLET | Freq: Two times a day (BID) | ORAL | Status: DC
  Administered 2017-12-22 – 2017-12-23 (×2): 40 mg via ORAL
  Filled 2017-12-22 (×2): qty 1

## 2017-12-22 MED ORDER — FUROSEMIDE 10 MG/ML IJ SOLN
40.0000 mg | Freq: Two times a day (BID) | INTRAMUSCULAR | Status: DC
  Administered 2017-12-22: 40 mg via INTRAVENOUS
  Filled 2017-12-22: qty 4

## 2017-12-22 NOTE — Progress Notes (Addendum)
PROGRESS NOTE    SYNDI PUA  OJJ:[id number] DOB: 1924/05/20 DOA: 12/19/2017 PCP: Maury Dus, MD   Brief Narrative: Tammy Fernandez is a 84 y.o. female with medical history significant of HTN, HLD, anxiety/depression, hyponatremia, hypothyroidism; who presents with weakness and abnormal labs.  History is obtained from the patient's son as she is currently altered and only says ok at this time  patient was noted to have constipation for which she had been taking MiraLAX normal and had 2-3 days of diarrhea prior to family no see what was going.  Over the last 2 days patient has been eating and drinking a little better, but was significantly weak and more confused per family.  At this time they note that the diarrhea had stopped.  Associated symptoms include increased lethargy.  She was evaluated by her PCP and lab work was obtained for which it revealed that she was significantly hyponatremic and patient was advised to come to the emergency department for further evaluation.  Patient had been admitted into the hospital with similar symptoms in 11/2015.  Assessment & Plan:   Principal Problem:   Hyponatremia Active Problems:   Acute hyponatremia   Acute encephalopathy   Acute respiratory failure with hypoxia (HCC)   Essential hypertension   Hypothyroidism   Hyponatremia:  Acute on chronic.  Patient presents with initial sodium of 112 admission.  Patient was initially significantly confused and answering okay to questions, this worsened in the AM of 1/18, but was improved 1/18 PM and 1/19.  Today she's A&Ox3 and is much improved.  Given history of recent diarrhea and low chloride levels suspected hypovolemic hyponatremia, but with initial CXR with edema, elevated BNP, pt likely actually overloaded. - Pt currently on 20 cc/hr of hypertonic saline, it was decreased this morning from 30 cc/hr overnight.  Will likely be able to d/c this soon and start diuresis, appreciate nephrology assistance -  nephrology c/s, appreciate assistance [ ]  frequent sodium checks   (goal Na at 72 hrs 130-132 ~1800 on 1/20) - Neurochecks  - Strict intake and output - Check BMPs every 4 hours - Adjust IV fluids as needed - holding demeclocycline  Hypoxia  Diastolic Heart Failure: pt overloaded on exam.  Currently receiving hypertonic saline.  Grade 2 diastolic dysfunction on echo.  CTA negative for PE, ctm.  Diuresis when able as above  Severe Pulmonary Hypertension:  - CTA to r/o PE with hypoxia - Pulm sent ANA, anti DNA, RF, CCP, anti GBM, ANCA, and antiscleroderma antibodies - pulm has signed off, "little for active intervention"  Elevated Troponin: not clear why this was drawn, but likely related to demand from pt heart failure.  Only mildly elevated.  Pt denies every having CP during this admission.  CTM.   Acute metabolic encephalopathy: improving . - Monitoring as noted above - will get head CT (no acute abnormality) - speech eval (regular/thin as tolerated - consider mech soft prn)  Essential hypertension: - holding lisinopril, propranolol, and amlodipine for now - Likely restart after we see how she does with diuresis  Allergies - Continue Claritin, prn Benadryl, and Flonase  Hypothyroidism - Check TSH - wnl - Continue levothyroxine  Anxiety - Continue Xanax as needed  [name] - Continue Protonix  PICC placed for access.  Needs to be removed at discharge.   DVT prophylaxis: lovenox Code Status: full  Family Communication: son and daughter Disposition Plan: pending improvement   Consultants:   PCCM  Procedures:   none  Antimicrobials:  Anti-infectives (From admission, onward)   Start     Dose/Rate Route Frequency Ordered Stop   12/19/17 2330  demeclocycline (DECLOMYCIN) tablet 150 mg  Status:  Discontinued     150 mg Oral 2 times daily 12/19/17 2322 12/21/17 1517      Subjective: A&O x 3.  Denies pain.  Objective: Vitals:   12/21/17 1630 12/21/17  1810 12/21/17 2033 12/22/17 0630  BP: 133/64 (!) 148/64 (!) 156/59 (!) 142/68  Pulse: 77 84 83 86  Resp: 14 16 16 18   Temp:  98.5 F (36.9 C) 98.6 F (37 C) 97.8 F (36.6 C)  TempSrc:  Oral Oral Oral  SpO2: 97% 97% 100% 98%  Weight:  89 kg (196 lb 3.4 oz)    Height:        Intake/Output Summary (Last 24 hours) at 12/22/2017 1021 Last data filed at 12/22/2017 0938 Gross per 24 hour  Intake 1545.67 ml  Output 1200 ml  Net 345.67 ml   Filed Weights   12/20/17 0041 12/21/17 1810  Weight: 81.6 kg (180 lb) 89 kg (196 lb 3.4 oz)    Examination:  General: No acute distress. Cardiovascular: Heart sounds show a regular rate, and rhythm. No gallops or rubs. No murmurs. +JVD Lungs: Clear to auscultation bilaterally with good air movement. No rales, rhonchi or wheezes. Abdomen: Soft, nontender, nondistended with normal active bowel sounds. No masses. No hepatosplenomegaly. Neurological: Alert and oriented 3. Moves all extremities 4 with equal strength. Cranial nerves II through XII grossly intact. Skin: dependent edema on hip Extremities: No clubbing or cyanosis. Trace LE edema Psychiatric: Mood and affect are normal. Insight and judgment are appropriate.   Data Reviewed: I have personally reviewed following labs and imaging studies  CBC: Recent Labs  Lab 12/19/17 1912 12/21/17 0743  WBC 6.9 9.3  NEUTROABS 5.6  --   HGB 14.6 12.9  HCT 41.5 37.8  MCV 86.6 89.4  PLT 262 182   Basic Metabolic Panel: Recent Labs  Lab 12/19/17 1919 12/19/17 2326  12/21/17 0743  12/21/17 1307 12/21/17 1803 12/21/17 2147 12/22/17 0255 12/22/17 0547  NA 112* 110*   < > 118*   < > 119* 124* 124* 126* 127*  K 4.8 4.7  --  4.7  --   --   --   --   --   --   CL 77* 73*  --  82*  --   --   --   --   --   --   CO2 27 30  --  30  --   --   --   --   --   --   GLUCOSE 126* 144*  --  95  --   --   --   --   --   --   BUN 13 15  --  18  --   --   --   --   --   --   CREATININE 0.63 0.76  --   0.65  --   --   --   --   --   --   CALCIUM 8.1* 8.5*  --  8.5*  --   --   --   --   --   --    < > = values in this interval not displayed.   GFR: Estimated Creatinine Clearance: 51.5 mL/min (by C-G formula based on SCr of 0.65 mg/dL). Liver Function Tests: Recent Labs  Lab 12/19/17  1919 12/19/17 2326  AST 30 22  ALT 11* 11*  ALKPHOS 72 79  BILITOT 1.1 0.7  PROT 7.0 7.6  ALBUMIN 3.3* 3.5   No results for input(s): LIPASE, AMYLASE in the last 168 hours. No results for input(s): AMMONIA in the last 168 hours. Coagulation Profile: No results for input(s): INR, PROTIME in the last 168 hours. Cardiac Enzymes: Recent Labs  Lab 12/22/17 0813  TROPONINI 0.04*   BNP (last 3 results) No results for input(s): PROBNP in the last 8760 hours. HbA1C: No results for input(s): HGBA1C in the last 72 hours. CBG: No results for input(s): GLUCAP in the last 168 hours. Lipid Profile: No results for input(s): CHOL, HDL, LDLCALC, TRIG, CHOLHDL, LDLDIRECT in the last 72 hours. Thyroid Function Tests: Recent Labs    12/19/17 1840  TSH 1.187   Anemia Panel: No results for input(s): VITAMINB12, FOLATE, FERRITIN, TIBC, IRON, RETICCTPCT in the last 72 hours. Sepsis Labs: Recent Labs  Lab 12/22/17 0255  LATICACIDVEN 0.7    No results found for this or any previous visit (from the past 240 hour(s)).       Radiology Studies: Ct Head Wo Contrast  Result Date: 12/20/2017 CLINICAL DATA:  84 year old female with altered level of consciousness and weakness. EXAM: CT HEAD WITHOUT CONTRAST TECHNIQUE: Contiguous axial images were obtained from the base of the skull through the vertex without intravenous contrast. COMPARISON:  12/05/2015 MR and prior studies FINDINGS: Brain: No evidence of acute infarction, hemorrhage, hydrocephalus, extra-axial collection or mass lesion/mass effect. Mild atrophy and chronic small-vessel white matter ischemic changes are again noted. Vascular: Mild atherosclerotic  calcifications Skull: Normal. Negative for fracture or focal lesion. Sinuses/Orbits: No acute finding. Other: None. IMPRESSION: 1. No evidence of acute intracranial abnormality 2. Mild atrophy and chronic small-vessel white matter ischemic changes. Electronically Signed   By: Margarette Canada M.D.   On: 12/20/2017 16:42   Ct Angio Chest Pe W Or Wo Contrast  Result Date: 12/21/2017 CLINICAL DATA:  Shortness of breath EXAM: CT ANGIOGRAPHY CHEST WITH CONTRAST TECHNIQUE: Multidetector CT imaging of the chest was performed using the standard protocol during bolus administration of intravenous contrast. Multiplanar CT image reconstructions and MIPs were obtained to evaluate the vascular anatomy. CONTRAST:  169mL ISOVUE-370 IOPAMIDOL (ISOVUE-370) INJECTION 76% COMPARISON:  12/16/2006 FINDINGS: Cardiovascular: Good opacification of the pulmonary arteries. There is some mixing artifact and diminished opacification of right lower lobe branches that is likely due to chronic collapse with poor blood flow. No evidence of acute pulmonary embolism. Borderline cardiomegaly with size distorted by leftward shift of the mediastinum. Enlarged pulmonary artery measuring up to 3.2 cm, as with pulmonary hypertension. Atherosclerotic calcification of the aorta diffusely. Mediastinum/Nodes: Negative for adenopathy or mass. Right upper extremity PICC with tip at the SVC level. Lungs/Pleura: Degree of bronchomalacia with central airway flattening. There is chronic collapse of the right lower lobe. Associated trace right pleural fluid. Few calcified granulomas. There is no edema, consolidation, effusion, or pneumothorax. Upper Abdomen: No acute finding. Musculoskeletal: No acute finding.  Generalized spondylosis. Review of the MIP images confirms the above findings. IMPRESSION: 1. Negative for pulmonary embolism or other acute finding. 2. Chronic elevation of the right diaphragm with right lower lobe collapse. 3. Bronchomalacia. 4.  Aortic  Atherosclerosis (ICD10-I70.0). Electronically Signed   By: Monte Fantasia M.D.   On: 12/21/2017 10:58   Dg Chest Port 1 View  Result Date: 12/21/2017 CLINICAL DATA:  84 year old female with confusion, weakness, recent cough and shortness of breath.  EXAM: PORTABLE CHEST 1 VIEW COMPARISON:  12/20/2017 and earlier. FINDINGS: Portable AP semi upright view at 0733 hours. Right PICC line placed. Chronic moderate to severe elevation of the right hemidiaphragm is only mildly progressed compared to 2,007. Associated right lung base atelectasis. No pneumothorax or pulmonary edema. No pleural effusions suspected. No other confluent pulmonary opacity. Stable cardiac size and mediastinal contours. Calcified aortic atherosclerosis. Visualized tracheal air column is within normal limits. Paucity bowel gas in the upper abdomen. IMPRESSION: 1. Right PICC line placed. 2.  No acute cardiopulmonary abnormality. 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 Electronically Signed   By: Odessa FlemingH  Hall M.D.   On: 12/21/2017 07:51        Scheduled Meds: . aspirin EC  81 mg Oral Daily  . enoxaparin (LOVENOX) injection  40 mg Subcutaneous QHS  . fluticasone  2 spray Each Nare Daily  . levothyroxine  50 mcg Oral QAC breakfast  . loratadine  10 mg Oral Daily  . pantoprazole  40 mg Oral Daily  . sodium chloride flush  10-40 mL Intracatheter Q12H   Continuous Infusions: . sodium chloride (hypertonic) 20 mL/hr (12/22/17 0434)     LOS: 3 days    Time spent: over 30 min    Lacretia Nicksaldwell Powell, MD [facility] Pager [phone]  If 7PM-7AM, please contact night-coverage [email or web address] Password [email or web address] 12/22/2017, 10:21 AM

## 2017-12-22 NOTE — Plan of Care (Signed)
  Progressing Education: Knowledge of General Education information will improve 12/22/2017 2324 - Progressing by Cristela FeltSteffens, Kolin Erdahl P, RN Clinical Measurements: Ability to maintain clinical measurements within normal limits will improve 12/22/2017 2324 - Progressing by Cristela FeltSteffens, Elianie Hubers P, RN Will remain free from infection 12/22/2017 2324 - Progressing by Cristela FeltSteffens, Finnlee Guarnieri P, RN Diagnostic test results will improve 12/22/2017 2324 - Progressing by Cristela FeltSteffens, Dameon Soltis P, RN Respiratory complications will improve 12/22/2017 2324 - Progressing by Cristela FeltSteffens, Haywood Meinders P, RN Cardiovascular complication will be avoided 12/22/2017 2324 - Progressing by Cristela FeltSteffens, Eupha Lobb P, RN Elimination: Will not experience complications related to urinary retention 12/22/2017 2324 - Progressing by Cristela FeltSteffens, Gyasi Hazzard P, RN Pain Managment: General experience of comfort will improve 12/22/2017 2324 - Progressing by Cristela FeltSteffens, Pax Reasoner P, RN Safety: Ability to remain free from injury will improve 12/22/2017 2324 - Progressing by Cristela FeltSteffens, Aira Sallade P, RN Skin Integrity: Risk for impaired skin integrity will decrease 12/22/2017 2324 - Progressing by Cristela FeltSteffens, Chivas Notz P, RN

## 2017-12-22 NOTE — Progress Notes (Signed)
Sodium level was drawn at 2325  and wasn't able to be run per lab until 01:15 and results came back at 0200 as 124.  Lab unable to put the time in correctly in the results.  So the results under 2147 are really from the 2325 draw.   The next sodium level is to be drawn now.  I have contacted IVT to come and draw it.

## 2017-12-22 NOTE — Progress Notes (Signed)
  Speech Language Pathology Treatment: Dysphagia  Patient Details Name: Tammy Fernandez MRN: [id number] DOB: 28-Dec-1923 Today's Date: 12/22/2017 Time: [id number] SLP Time Calculation (min) (ACUTE ONLY): 20 min  Assessment / Plan / Recommendation Clinical Impression  Pt seen for follow-up for dysphagia. Mentation improving; pt alert, oriented, following all commands. Able to self-feed thin liquids, pureed and regular solids with no oral holding noted, appearance of timely oral transit and swallow initiation. No overt signs of aspiration initially, however after approximately 8oz of intake, delayed coughing noted after solids. Pt does have a history of reflux; her son reports frequent coughing after vs during meals, consistent with primary esophageal vs oropharyngeal dysphagia. Mastication is significantly prolonged with regular cracker; due to efforts of mastication will downgrade to dys 3 (mechanical soft) with thin liquids. RN administered medications whole with liquid which pt tolerated without overt signs of aspiration. Meds can be given whole with liquid or whole in puree, depending on pt's preference. Education completed with pt and son re: strategies for management of esophageal symptoms including upright posture or out of bed during and for 30 minutes after meals, alternating solids and liquids, thorough mastication. Will continue to f/u for tolerance and to determine need for further esophageal w/u.     HPI HPI: 84 y.o. female with medical history significant of HTN, HLD, anxiety/depression, hyponatremia, hypothyroidism; who presents with weakness and abnormal labs.  History is obtained from the patient's son as she is currently altered and only says ok at this time  patient was noted to have constipation for which she had been taking MiraLAX normal and had 2-3 days of diarrhea prior to family no see what was going.  Over the last 2 days patient has been eating and drinking a little better, but was  significantly weak and more confused per family.  At this time they note that the diarrhea had stopped.  Associated symptoms include increased lethargy.  She was evaluated by her PCP and lab work was obtained for which it revealed that she was significantly hyponatremic and patient was advised to come to the emergency department for further evaluation      SLP Plan  Continue with current plan of care       Recommendations  Diet recommendations: Dysphagia 3 (mechanical soft);Thin liquid Liquids provided via: Cup;Straw Medication Administration: Whole meds with liquid Supervision: Patient able to self feed Compensations: Slow rate;Small sips/bites;Follow solids with liquid Postural Changes and/or Swallow Maneuvers: Seated upright 90 degrees;Upright 30-60 min after meal                Oral Care Recommendations: Oral care BID Follow up Recommendations: Other (comment)(TBD) SLP Visit Diagnosis: Dysphagia, unspecified (R13.10) Plan: Continue with current plan of care       GO             Tammy BatonMary Beth Tammy Jenson, MS, CCC-SLP Speech-Language Pathologist [phone]  Tammy LindauMary E Tammy Fernandez 12/22/2017, 6:00 PM

## 2017-12-22 NOTE — Progress Notes (Signed)
Subjective: Interval History: has no complaint, feeling better, confirmed by daughter.  Objective: Vital signs in last 24 hours: Temp:  [97.8 F (36.6 C)-98.6 F (37 C)] 97.8 F (36.6 C) (01/20 0630) Pulse Rate:  [76-86] 86 (01/20 0630) Resp:  [12-18] 18 (01/20 0630) BP: (132-156)/(52-68) 142/68 (01/20 0630) SpO2:  [95 %-100 %] 98 % (01/20 0630) Weight:  [89 kg (196 lb 3.4 oz)] 89 kg (196 lb 3.4 oz) (01/19 1810) Weight change:   Intake/Output from previous day: 01/19 0701 - 01/20 0700 In: 1545.7 [P.O.:240; I.V.:1305.7] Out: 1200 [Urine:1200] Intake/Output this shift: No intake/output data recorded.  General appearance: alert, cooperative, no distress and moderately obese Resp: diminished breath sounds bilaterally Cardio: S1, S2 normal and systolic murmur: systolic ejection 2/6, decrescendo at 2nd left intercostal space GI: obese, pos bs, soft, incision lower abdm Extremities: edema 1+  Lab Results: Recent Labs    12/19/17 1912 12/21/17 0743  WBC 6.9 9.3  HGB 14.6 12.9  HCT 41.5 37.8  PLT 262 279   BMET:  Recent Labs    12/19/17 2326  12/21/17 0743  12/22/17 0255 12/22/17 0547  NA 110*   < > 118*   < > 126* 127*  K 4.7  --  4.7  --   --   --   CL 73*  --  82*  --   --   --   CO2 30  --  30  --   --   --   GLUCOSE 144*  --  95  --   --   --   BUN 15  --  18  --   --   --   CREATININE 0.76  --  0.65  --   --   --   CALCIUM 8.5*  --  8.5*  --   --   --    < > = values in this interval not displayed.   No results for input(s): PTH in the last 72 hours. Iron Studies: No results for input(s): IRON, TIBC, TRANSFERRIN, FERRITIN in the last 72 hours.  Studies/Results: Ct Head Wo Contrast  Result Date: 12/20/2017 CLINICAL DATA:  84 year old female with altered level of consciousness and weakness. EXAM: CT HEAD WITHOUT CONTRAST TECHNIQUE: Contiguous axial images were obtained from the base of the skull through the vertex without intravenous contrast. COMPARISON:   12/05/2015 MR and prior studies FINDINGS: Brain: No evidence of acute infarction, hemorrhage, hydrocephalus, extra-axial collection or mass lesion/mass effect. Mild atrophy and chronic small-vessel white matter ischemic changes are again noted. Vascular: Mild atherosclerotic calcifications Skull: Normal. Negative for fracture or focal lesion. Sinuses/Orbits: No acute finding. Other: None. IMPRESSION: 1. No evidence of acute intracranial abnormality 2. Mild atrophy and chronic small-vessel white matter ischemic changes. Electronically Signed   By: Harmon PierJeffrey  Hu M.D.   On: 12/20/2017 16:42   Ct Angio Chest Pe W Or Wo Contrast  Result Date: 12/21/2017 CLINICAL DATA:  Shortness of breath EXAM: CT ANGIOGRAPHY CHEST WITH CONTRAST TECHNIQUE: Multidetector CT imaging of the chest was performed using the standard protocol during bolus administration of intravenous contrast. Multiplanar CT image reconstructions and MIPs were obtained to evaluate the vascular anatomy. CONTRAST:  100mL ISOVUE-370 IOPAMIDOL (ISOVUE-370) INJECTION 76% COMPARISON:  12/16/2006 FINDINGS: Cardiovascular: Good opacification of the pulmonary arteries. There is some mixing artifact and diminished opacification of right lower lobe branches that is likely due to chronic collapse with poor blood flow. No evidence of acute pulmonary embolism. Borderline cardiomegaly with size distorted by  leftward shift of the mediastinum. Enlarged pulmonary artery measuring up to 3.2 cm, as with pulmonary hypertension. Atherosclerotic calcification of the aorta diffusely. Mediastinum/Nodes: Negative for adenopathy or mass. Right upper extremity PICC with tip at the SVC level. Lungs/Pleura: Degree of bronchomalacia with central airway flattening. There is chronic collapse of the right lower lobe. Associated trace right pleural fluid. Few calcified granulomas. There is no edema, consolidation, effusion, or pneumothorax. Upper Abdomen: No acute finding. Musculoskeletal: No  acute finding.  Generalized spondylosis. Review of the MIP images confirms the above findings. IMPRESSION: 1. Negative for pulmonary embolism or other acute finding. 2. Chronic elevation of the right diaphragm with right lower lobe collapse. 3. Bronchomalacia. 4.  Aortic Atherosclerosis (ICD10-I70.0). Electronically Signed   By: Marnee Spring M.D.   On: 12/21/2017 10:58   Dg Chest Port 1 View  Result Date: 12/21/2017 CLINICAL DATA:  84 year old female with confusion, weakness, recent cough and shortness of breath. EXAM: PORTABLE CHEST 1 VIEW COMPARISON:  12/20/2017 and earlier. FINDINGS: Portable AP semi upright view at 0733 hours. Right PICC line placed. Chronic moderate to severe elevation of the right hemidiaphragm is only mildly progressed compared to 2,007. Associated right lung base atelectasis. No pneumothorax or pulmonary edema. No pleural effusions suspected. No other confluent pulmonary opacity. Stable cardiac size and mediastinal contours. Calcified aortic atherosclerosis. Visualized tracheal air column is within normal limits. Paucity bowel gas in the upper abdomen. IMPRESSION: 1. Right PICC line placed. 2.  No acute cardiopulmonary abnormality. 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 Electronically Signed   By: Odessa Fleming M.D.   On: 12/21/2017 07:51    I have reviewed the patient's current medications.  Assessment/Plan: 1 Hyponatremia . Mixed picture of initially hypovol and hyper volemic hyponatremia.  Now hypervolemic. Discussed with Dr. Lowell Guitar to stop 3% and start Furosemide with fluid restriction. 2 Obesity  3 HTN 5 Hypothyroid TSH ok 6 CAD 7 CVA P stop 3%, give Lasix , cont fluid restriction   LOS: 3 days   Tammy Fernandez Tammy Fernandez 12/22/2017,12:37 PM

## 2017-12-23 LAB — CBC
HEMATOCRIT: 39.7 % (ref 36.0–46.0)
HEMOGLOBIN: 13 g/dL (ref 12.0–15.0)
MCH: 30.2 pg (ref 26.0–34.0)
MCHC: 32.7 g/dL (ref 30.0–36.0)
MCV: 92.3 fL (ref 78.0–100.0)
Platelets: 262 10*3/uL (ref 150–400)
RBC: 4.3 MIL/uL (ref 3.87–5.11)
RDW: 13.4 % (ref 11.5–15.5)
WBC: 9.1 10*3/uL (ref 4.0–10.5)

## 2017-12-23 LAB — RENAL FUNCTION PANEL
ALBUMIN: 3.1 g/dL — AB (ref 3.5–5.0)
Anion gap: 8 (ref 5–15)
BUN: 6 mg/dL (ref 6–20)
CHLORIDE: 78 mmol/L — AB (ref 101–111)
CO2: 42 mmol/L — ABNORMAL HIGH (ref 22–32)
Calcium: 8.8 mg/dL — ABNORMAL LOW (ref 8.9–10.3)
Creatinine, Ser: 0.54 mg/dL (ref 0.44–1.00)
Glucose, Bld: 129 mg/dL — ABNORMAL HIGH (ref 65–99)
PHOSPHORUS: 1.7 mg/dL — AB (ref 2.5–4.6)
POTASSIUM: 3.3 mmol/L — AB (ref 3.5–5.1)
Sodium: 128 mmol/L — ABNORMAL LOW (ref 135–145)

## 2017-12-23 LAB — SODIUM
SODIUM: 123 mmol/L — AB (ref 135–145)
Sodium: 126 mmol/L — ABNORMAL LOW (ref 135–145)
Sodium: 127 mmol/L — ABNORMAL LOW (ref 135–145)

## 2017-12-23 LAB — MPO/PR-3 (ANCA) ANTIBODIES: ANCA PROTEINASE 3: 4.2 U/mL — AB (ref 0.0–3.5)

## 2017-12-23 LAB — ANTI-SCLERODERMA ANTIBODY: Scleroderma (Scl-70) (ENA) Antibody, IgG: <0.2 AI (ref 0.0–0.9)

## 2017-12-23 LAB — CYCLIC CITRUL PEPTIDE ANTIBODY, IGG/IGA: CCP Antibodies IgG/IgA: 6 units (ref 0–19)

## 2017-12-23 LAB — ANTI-DNA ANTIBODY, DOUBLE-STRANDED

## 2017-12-23 LAB — GLOMERULAR BASEMENT MEMBRANE ANTIBODIES: GBM AB: 3 U (ref 0–20)

## 2017-12-23 LAB — ANTINUCLEAR ANTIBODIES, IFA: ANA Ab, IFA: NEGATIVE

## 2017-12-23 LAB — MAGNESIUM: MAGNESIUM: 1.4 mg/dL — AB (ref 1.7–2.4)

## 2017-12-23 MED ORDER — POTASSIUM PHOSPHATES 15 MMOLE/5ML IV SOLN
30.0000 mmol | Freq: Once | INTRAVENOUS | Status: AC
  Administered 2017-12-23: 30 mmol via INTRAVENOUS
  Filled 2017-12-23: qty 10

## 2017-12-23 MED ORDER — SODIUM CHLORIDE 0.9 % IV SOLN
INTRAVENOUS | Status: DC
  Administered 2017-12-23: 18:00:00 via INTRAVENOUS

## 2017-12-23 MED ORDER — POLYETHYLENE GLYCOL 3350 17 G PO PACK
17.0000 g | PACK | Freq: Two times a day (BID) | ORAL | Status: DC
  Administered 2017-12-23 – 2017-12-29 (×11): 17 g via ORAL
  Filled 2017-12-23 (×12): qty 1

## 2017-12-23 MED ORDER — POTASSIUM CHLORIDE CRYS ER 20 MEQ PO TBCR: 40.0000 meq | EXTENDED_RELEASE_TABLET | ORAL | Status: DC

## 2017-12-23 MED ORDER — POTASSIUM CHLORIDE CRYS ER 20 MEQ PO TBCR
40.0000 meq | EXTENDED_RELEASE_TABLET | Freq: Once | ORAL | Status: AC
  Administered 2017-12-23: 40 meq via ORAL

## 2017-12-23 MED ORDER — CETIRIZINE HCL 10 MG PO TABS
10.0000 mg | ORAL_TABLET | Freq: Every day | ORAL | Status: DC
  Administered 2017-12-24 – 2017-12-29 (×6): 10 mg via ORAL
  Filled 2017-12-23 (×6): qty 1

## 2017-12-23 MED ORDER — MAGNESIUM SULFATE 2 GM/50ML IV SOLN
2.0000 g | Freq: Once | INTRAVENOUS | Status: AC
  Administered 2017-12-23: 2 g via INTRAVENOUS
  Filled 2017-12-23: qty 50

## 2017-12-23 NOTE — Progress Notes (Signed)
Subjective: Interval History: feeling lightheaded, serum HCO3 up> 40 today  Objective: Vital signs in last 24 hours: Temp:  [97.9 F (36.6 C)-98 F (36.7 C)] 97.9 F (36.6 C) (01/21 0601) Pulse Rate:  [92-118] 109 (01/21 1356) Resp:  [18] 18 (01/21 0601) BP: (161-186)/(67-79) 186/79 (01/21 1356) SpO2:  [92 %-95 %] 95 % (01/21 1356) Weight change:   Intake/Output from previous day: 01/20 0701 - 01/21 0700 In: 120 [P.O.:120] Out: 5300 [Urine:5300] Intake/Output this shift: Total I/O In: 130 [P.O.:120; I.V.:10] Out: 850 [Urine:850]  General appearance: tired, washcloth on the forehead, not in distress Resp: diminished breath sounds bilaterally Cardio: S1, S2 normal and systolic murmur: systolic ejection 2/6, decrescendo at 2nd left intercostal space GI: obese, pos bs, soft, incision lower abdm Extremities: no leg edema  Lab Results: Recent Labs    12/21/17 0743 12/23/17 0403  WBC 9.3 9.1  HGB 12.9 13.0  HCT 37.8 39.7  PLT 279 262   BMET:  Recent Labs    12/22/17 0810  12/23/17 0403 12/23/17 1059  NA 126*   < > 128* 126*  K 4.0  --  3.3*  --   CL 90*  --  78*  --   CO2 29  --  42*  --   GLUCOSE 75  --  129*  --   BUN 10  --  6  --   CREATININE 0.52  --  0.54  --   CALCIUM 8.3*  --  8.8*  --    < > = values in this interval not displayed.   No results for input(s): PTH in the last 72 hours. Iron Studies: No results for input(s): IRON, TIBC, TRANSFERRIN, FERRITIN in the last 72 hours.  Studies/Results: No results found.  I have reviewed the patient's current medications.  Assessment/Plan: 1 Hyponatremia . Mixed picture of initially hypovol then hyper volemic hyponatremia. Got diuretics,  Had > 5L out yest and now vol depleted 2 Obesity  3 HTN 5 Hypothyroid TSH ok 6 CAD 7 CVA  P dc lasix, give back some NS 1 L overnight   LOS: 4 days   Tammy KrabbeRobert D Olie Scaffidi 12/23/2017,3:22 PM

## 2017-12-23 NOTE — Progress Notes (Addendum)
  Speech Language Pathology Treatment: Dysphagia  Patient Details Name: Tammy Fernandez MRN: [id number] DOB: Feb 10, 1924 Today's Date: 12/23/2017 Time: 1500-1530 SLP Time Calculation (min) (ACUTE ONLY): 30 min  Assessment / Plan / Recommendation Clinical Impression  Pt seen to assess po tolerance, indication for esophageal evaluation.  Pt's daughter *? [name]* present, partially consumed meal tray sitting on table.  Pt lying to approx 20* in bed- was amenable to sitting upright more due to h/o reflux.  Pt with minimal frothy secretions in mouth *lateral sulci* and daughter reports she removed some secretions from pt's outer mouth.    Observed pt consuming water via straw - belching observed but no indication of aspiration.  Pt denies having problems getting food/liquid down but admits to problems with refluxing AFTER meals.  Recommend continue diet with reflux precautions.  Provided daughter and pt with written precautions and recommended pt to keep her voice/cough and expectoration ability strong for maximal airway protection especially given phrenic nerve palsy.  All education completed, thanks!    HPI HPI: 84 y.o. female with medical history significant of HTN, HLD, anxiety/depression, hyponatremia, hypothyroidism; who presents with weakness and abnormal labs - sodium was very low but is now improved.   Patient was also noted to have constipation for which she had been taking MiraLAX normal and had 2-3 days of diarrhea prior to family no see what was going.  Pt was significantly weak and more confused per family but sodium levels are improving.       SLP Plan  All goals met       Recommendations  Liquids provided via: Cup;Straw Medication Administration: Whole meds with liquid Supervision: Patient able to self feed Compensations: Slow rate;Small sips/bites;Follow solids with liquid Postural Changes and/or Swallow Maneuvers: Seated upright 90 degrees;Upright 30-60 min after meal                 Oral Care Recommendations: Oral care BID Follow up Recommendations: Other (comment)(TBD) SLP Visit Diagnosis: Dysphagia, unspecified (R13.10) Plan: All goals met       GO                Macario Golds 12/23/2017, 3:40 PM  Luanna Salk, [facility] SLP [phone]

## 2017-12-23 NOTE — Plan of Care (Signed)
  Progressing Education: Knowledge of General Education information will improve 12/23/2017 2252 - Progressing by Cristela FeltSteffens, Opha Mcghee P, RN Clinical Measurements: Ability to maintain clinical measurements within normal limits will improve 12/23/2017 2252 - Progressing by Cristela FeltSteffens, Gabbriella Presswood P, RN Will remain free from infection 12/23/2017 2252 - Progressing by Cristela FeltSteffens, Demoni Gergen P, RN Diagnostic test results will improve 12/23/2017 2252 - Progressing by Cristela FeltSteffens, Darcel Zick P, RN Respiratory complications will improve 12/23/2017 2252 - Progressing by Cristela FeltSteffens, Dracen Reigle P, RN Cardiovascular complication will be avoided 12/23/2017 2252 - Progressing by Cristela FeltSteffens, Diontre Harps P, RN Elimination: Will not experience complications related to urinary retention 12/23/2017 2252 - Progressing by Cristela FeltSteffens, Myron Lona P, RN Pain Managment: General experience of comfort will improve 12/23/2017 2252 - Progressing by Cristela FeltSteffens, Aviyon Hocevar P, RN Safety: Ability to remain free from injury will improve 12/23/2017 2252 - Progressing by Cristela FeltSteffens, Parisa Pinela P, RN

## 2017-12-23 NOTE — Progress Notes (Signed)
Pt's BP 183/87 HR 102. NP on call, Craige CottaKirby, paged several times. Per PTA med list, pt takes BP meds daily. PTA meds not ordered at this time. Will continue to monitor.

## 2017-12-23 NOTE — Progress Notes (Signed)
PROGRESS NOTE    Tammy Fernandez  UVO:[id number] DOB: 09/20/1924 DOA: 12/19/2017 PCP: Tammy Dus, MD   Brief Narrative: Tammy Fernandez is Tammy Fernandez 84 y.o. female with medical history significant of HTN, HLD, anxiety/depression, hyponatremia, hypothyroidism; who presents with weakness and abnormal labs.  History is obtained from the patient's son as she is currently altered and only says ok at this time  patient was noted to have constipation for which she had been taking MiraLAX normal and had 2-3 days of diarrhea prior to family no see what was going.  Over the last 2 days patient has been eating and drinking Tammy Fernandez little better, but was significantly weak and more confused per family.  At this time they note that the diarrhea had stopped.  Associated symptoms include increased lethargy.  She was evaluated by her PCP and lab work was obtained for which it revealed that she was significantly hyponatremic and patient was advised to come to the emergency department for further evaluation.  Patient had been admitted into the hospital with similar symptoms in 11/2015.  Assessment & Plan:   Principal Problem:   Hyponatremia Active Problems:   Acute hyponatremia   Acute encephalopathy   Acute respiratory failure with hypoxia (HCC)   Essential hypertension   Hypothyroidism   Hyponatremia:  Acute on chronic.  Patient presents with initial sodium of 112 admission.  Patient was initially significantly confused and answering okay to questions, this worsened in the AM of 1/18, but was improved 1/18 PM and 1/19.  Today she continues to be Tammy Fernandez&Ox3 and is much improved.  Given history of recent diarrhea and low chloride levels suspected hypovolemic hyponatremia, but with initial CXR with edema, elevated BNP, pt likely actually overloaded. - Hypertonic saline has been d/c'd.  Currently on 40 lasix BID - nephrology c/s, appreciate assistance [ ]  frequent sodium checks (BID as ordered by nephrology) - Neurochecks  -  Strict intake and output - Adjust IV fluids as needed - holding demeclocycline  Filed Weights   12/20/17 0041 12/21/17 1810  Weight: 81.6 kg (180 lb) 89 kg (196 lb 3.4 oz)   Hypoxia  Diastolic Heart Failure: pt overloaded on exam.  Currently receiving hypertonic saline.  Grade 2 diastolic dysfunction on echo.  CTA negative for PE, ctm.  Diuresis as above Strict I/O, daily weights  Severe Pulmonary Hypertension:  - CTA r/o PE with hypoxia - Pulm sent ANA, anti DNA, RF, CCP, anti GBM, ANCA, and antiscleroderma antibodies - pulm has signed off, "little for active intervention"  Tachycardia:  Mild sinus tach this morning.  Pt denies any pain.  Will need to watch with diuresis for above.    Lightheadedness:  Pt notes feeling Tammy Fernandez bit lightheaded.  She relates this to her allergies?  Will continue her home allergy meds.  With tachycardia above, will watch closely with diuresis.    Elevated Troponin: not clear why this was drawn, but likely related to demand from pt heart failure.  Only mildly elevated.  Pt denies every having CP during this admission.  CTM.   Acute metabolic encephalopathy: improving . - Monitoring as noted above - will get head CT (no acute abnormality) - speech eval (recommending dysphagia 3 diet - planning ot continue to follow for tolerance and determine need for further esophageal w/u)  Essential hypertension: - holding lisinopril, propranolol, and amlodipine for now - Likely restart after we see how she does with diuresis  Allergies - Continue Claritin, prn Benadryl, and Flonase  Hypothyroidism -  Check TSH - wnl - Continue levothyroxine  Anxiety - Continue Xanax as needed  [name] - Continue Protonix  PICC placed for access.  Needs to be removed at discharge.   DVT prophylaxis: lovenox Code Status: full  Family Communication: son and daughter Disposition Plan: pending improvement   Consultants:   PCCM  Procedures:   none  Antimicrobials:   Anti-infectives (From admission, onward)   Start     Dose/Rate Route Frequency Ordered Stop   12/19/17 2330  demeclocycline (DECLOMYCIN) tablet 150 mg  Status:  Discontinued     150 mg Oral 2 times daily 12/19/17 2322 12/21/17 1517      Subjective: Tammy Fernandez&O x 3.  Denies pain. Notes some LH.  Thinks related to allergies.  Objective: Vitals:   12/22/17 0630 12/22/17 1411 12/22/17 2118 12/23/17 0601  BP: (!) 142/68 136/65 (!) 174/71 (!) 161/67  Pulse: 86 77 92 (!) 118  Resp: 18 18 18 18   Temp: 97.8 F (36.6 C) 97.9 F (36.6 C) 98 F (36.7 C) 97.9 F (36.6 C)  TempSrc: Oral Oral Oral Oral  SpO2: 98% 92% 92% 95%  Weight:      Height:        Intake/Output Summary (Last 24 hours) at 12/23/2017 1210 Last data filed at 12/23/2017 1056 Gross per 24 hour  Intake 250 ml  Output 5300 ml  Net -5050 ml   Filed Weights   12/20/17 0041 12/21/17 1810  Weight: 81.6 kg (180 lb) 89 kg (196 lb 3.4 oz)    Examination:  General: No acute distress. Cardiovascular: Heart sounds show Ralston Venus regular rate, and rhythm. No gallops or rubs. No murmurs. Lungs: Clear to auscultation bilaterally with good air movement. No rales, rhonchi or wheezes. Abdomen: Soft, nontender, nondistended with normal active bowel sounds. No masses. No hepatosplenomegaly. Neurological: Alert and oriented 3. Moves all extremities 4 with equal strength. Cranial nerves II through XII grossly intact. Skin: Warm and dry. No rashes or lesions. Extremities: No clubbing or cyanosis. Pedal edema, trace to 1+.  Psychiatric: Mood and affect are normal. Insight and judgment are appropriae.  Data Reviewed: I have personally reviewed following labs and imaging studies  CBC: Recent Labs  Lab 12/19/17 1912 12/21/17 0743 12/23/17 0403  WBC 6.9 9.3 9.1  NEUTROABS 5.6  --   --   HGB 14.6 12.9 13.0  HCT 41.5 37.8 39.7  MCV 86.6 89.4 92.3  PLT 262 279 440   Basic Metabolic Panel: Recent Labs  Lab 12/19/17 1919 12/19/17 2326   12/21/17 0743  12/22/17 0547 12/22/17 0810 12/22/17 2357 12/23/17 0403 12/23/17 1059  NA 112* 110*   < > 118*   < > 127* 126* 127* 128* 126*  K 4.8 4.7  --  4.7  --   --  4.0  --  3.3*  --   CL 77* 73*  --  82*  --   --  90*  --  78*  --   CO2 27 30  --  30  --   --  29  --  42*  --   GLUCOSE 126* 144*  --  95  --   --  75  --  129*  --   BUN 13 15  --  18  --   --  10  --  6  --   CREATININE 0.63 0.76  --  0.65  --   --  0.52  --  0.54  --   CALCIUM 8.1* 8.5*  --  8.5*  --   --  8.3*  --  8.8*  --   MG  --   --   --   --   --   --   --   --  1.4*  --   PHOS  --   --   --   --   --   --   --   --  1.7*  --    < > = values in this interval not displayed.   GFR: Estimated Creatinine Clearance: 51.5 mL/min (by C-G formula based on SCr of 0.54 mg/dL). Liver Function Tests: Recent Labs  Lab 12/19/17 1919 12/19/17 2326 12/23/17 0403  AST 30 22  --   ALT 11* 11*  --   ALKPHOS 72 79  --   BILITOT 1.1 0.7  --   PROT 7.0 7.6  --   ALBUMIN 3.3* 3.5 3.1*   No results for input(s): LIPASE, AMYLASE in the last 168 hours. No results for input(s): AMMONIA in the last 168 hours. Coagulation Profile: No results for input(s): INR, PROTIME in the last 168 hours. Cardiac Enzymes: Recent Labs  Lab 12/22/17 0813  TROPONINI 0.04*   BNP (last 3 results) No results for input(s): PROBNP in the last 8760 hours. HbA1C: No results for input(s): HGBA1C in the last 72 hours. CBG: No results for input(s): GLUCAP in the last 168 hours. Lipid Profile: No results for input(s): CHOL, HDL, LDLCALC, TRIG, CHOLHDL, LDLDIRECT in the last 72 hours. Thyroid Function Tests: No results for input(s): TSH, T4TOTAL, FREET4, T3FREE, THYROIDAB in the last 72 hours. Anemia Panel: No results for input(s): VITAMINB12, FOLATE, FERRITIN, TIBC, IRON, RETICCTPCT in the last 72 hours. Sepsis Labs: Recent Labs  Lab 12/22/17 0255  LATICACIDVEN 0.7    No results found for this or any previous visit (from the past  240 hour(s)).       Radiology Studies: No results found.      Scheduled Meds: . aspirin EC  81 mg Oral Daily  . [START ON 12/24/2017] cetirizine  10 mg Oral Daily  . enoxaparin (LOVENOX) injection  40 mg Subcutaneous QHS  . fluticasone  2 spray Each Nare Daily  . furosemide  40 mg Oral BID  . levothyroxine  50 mcg Oral QAC breakfast  . pantoprazole  40 mg Oral Daily  . polyethylene glycol  17 g Oral BID  . sodium chloride flush  10-40 mL Intracatheter Q12H   Continuous Infusions: . potassium PHOSPHATE IVPB (mmol)       LOS: 4 days    Time spent: over 30 min    Fayrene Helper, MD [facility] Pager [phone]  If 7PM-7AM, please contact night-coverage [email or web address] Password [email or web address] 12/23/2017, 12:10 PM

## 2017-12-24 ENCOUNTER — Inpatient Hospital Stay (HOSPITAL_COMMUNITY): Payer: Medicare Other

## 2017-12-24 LAB — SODIUM
Sodium: 104 mmol/L — CL (ref 135–145)
Sodium: 118 mmol/L — CL (ref 135–145)
Sodium: 120 mmol/L — ABNORMAL LOW (ref 135–145)
Sodium: 122 mmol/L — ABNORMAL LOW (ref 135–145)

## 2017-12-24 LAB — CBC
HCT: 41.2 % (ref 36.0–46.0)
Hemoglobin: 14 g/dL (ref 12.0–15.0)
MCH: 30.6 pg (ref 26.0–34.0)
MCHC: 34 g/dL (ref 30.0–36.0)
MCV: 90 fL (ref 78.0–100.0)
PLATELETS: 247 10*3/uL (ref 150–400)
RBC: 4.58 MIL/uL (ref 3.87–5.11)
RDW: 12.8 % (ref 11.5–15.5)
WBC: 9.9 10*3/uL (ref 4.0–10.5)

## 2017-12-24 LAB — BASIC METABOLIC PANEL
Anion gap: 13 (ref 5–15)
CO2: 44 mmol/L — ABNORMAL HIGH (ref 22–32)
CREATININE: 0.47 mg/dL (ref 0.44–1.00)
Calcium: 8.8 mg/dL — ABNORMAL LOW (ref 8.9–10.3)
Chloride: 65 mmol/L — ABNORMAL LOW (ref 101–111)
GFR calc Af Amer: >60 mL/min (ref >60)
Glucose, Bld: 138 mg/dL — ABNORMAL HIGH (ref 65–99)
Potassium: 2.9 mmol/L — ABNORMAL LOW (ref 3.5–5.1)
Sodium: 122 mmol/L — ABNORMAL LOW (ref 135–145)

## 2017-12-24 LAB — MRSA PCR SCREENING: MRSA by PCR: NEGATIVE

## 2017-12-24 LAB — MAGNESIUM: MAGNESIUM: 1.4 mg/dL — AB (ref 1.7–2.4)

## 2017-12-24 LAB — PHOSPHORUS: Phosphorus: 1.7 mg/dL — ABNORMAL LOW (ref 2.5–4.6)

## 2017-12-24 MED ORDER — POTASSIUM CHLORIDE CRYS ER 20 MEQ PO TBCR
40.0000 meq | EXTENDED_RELEASE_TABLET | ORAL | Status: AC
  Administered 2017-12-24 (×2): 40 meq via ORAL
  Filled 2017-12-24 (×2): qty 2

## 2017-12-24 MED ORDER — AMLODIPINE BESYLATE 10 MG PO TABS
10.0000 mg | ORAL_TABLET | Freq: Every day | ORAL | Status: DC
  Administered 2017-12-24 – 2017-12-29 (×6): 10 mg via ORAL
  Filled 2017-12-24 (×6): qty 1

## 2017-12-24 MED ORDER — SODIUM CHLORIDE 3 % IV SOLN
INTRAVENOUS | Status: DC
  Administered 2017-12-24: 50 mL/h via INTRAVENOUS
  Filled 2017-12-24 (×2): qty 500

## 2017-12-24 MED ORDER — MAGNESIUM SULFATE 4 GM/100ML IV SOLN
4.0000 g | Freq: Once | INTRAVENOUS | Status: AC
  Administered 2017-12-24: 4 g via INTRAVENOUS
  Filled 2017-12-24: qty 100

## 2017-12-24 MED ORDER — POTASSIUM PHOSPHATES 15 MMOLE/5ML IV SOLN
30.0000 mmol | Freq: Once | INTRAVENOUS | Status: AC
  Administered 2017-12-24: 30 mmol via INTRAVENOUS
  Filled 2017-12-24: qty 10

## 2017-12-24 NOTE — Care Management Important Message (Signed)
Important Message  Patient Details  Name: Tammy Fernandez MRN: [id number] Date of Birth: [birth year]   Medicare Important Message Given:  Yes    [name], Javaris Wigington 12/24/2017, 11:14 AMImportant Message  Patient Details  Name: Tammy Fernandez MRN: [id number] Date of Birth: [birth year]   Medicare Important Message Given:  Yes    [name], Isaiha Asare 12/24/2017, 11:14 AM

## 2017-12-24 NOTE — Progress Notes (Addendum)
PROGRESS NOTE    Tammy Fernandez  SJG:[id number] DOB: Dec 02, 1924 DOA: 12/19/2017 PCP: Tammy Dus, MD   Brief Narrative: Tammy Fernandez is Tammy Fernandez 84 y.o. female with medical history significant of HTN, HLD, anxiety/depression, hyponatremia, hypothyroidism; who presents with weakness and abnormal labs.  History is obtained from the patient's son as she is currently altered and only says ok at this time  patient was noted to have constipation for which she had been taking MiraLAX normal and had 2-3 days of diarrhea prior to family no see what was going.  Over the last 2 days patient has been eating and drinking Tammy Fernandez little better, but was significantly weak and more confused per family.  At this time they note that the diarrhea had stopped.  Associated symptoms include increased lethargy.  She was evaluated by her PCP and lab work was obtained for which it revealed that she was significantly hyponatremic and patient was advised to come to the emergency department for further evaluation.  Patient had been admitted into the hospital with similar symptoms in 11/2015.  Assessment & Plan:   Principal Problem:   Hyponatremia Active Problems:   Acute hyponatremia   Acute encephalopathy   Acute respiratory failure with hypoxia (HCC)   Essential hypertension   Hypothyroidism   Hyponatremia:  Acute on chronic.  Patient presents with initial sodium of 112 admission.  Patient was initially significantly confused and answering okay to questions, this worsened in the AM of 1/18, but was improved 1/18 PM and 1/19.  Today she continues to be Tammy Fernandez&Ox3 and is much improved.  Given history of recent diarrhea and low chloride levels suspected hypovolemic hyponatremia, but with initial CXR with edema, elevated BNP, pt likely actually overloaded. - she's s/p hypertonic saline, then lasix for overload.  Then she was given NS overnight on 1/21-22 for concern for hypovolemia with LH and tachycardia.  - sodium worsening  overnight, to 104 now (not sure if this is accurate, she's Tammy Fernandez&Ox3 and is consistent with her previous exams)  - will send stat repeat Na, restart hypertonic at 50 cc/hr (which should correct at about 0.5 mmol/L/hr [ ]  CXR to help with volume assessment - nephrology c/s, will discuss above with nephrology [ ]  frequent sodium checks  - Neurochecks  - Strict intake and output - Adjust IV fluids as needed - holding demeclocycline  Filed Weights   12/21/17 1810 12/24/17 0420 12/24/17 0421  Weight: 89 kg (196 lb 3.4 oz) 83.8 kg (184 lb 11.9 oz) 83.8 kg (184 lb 11.9 oz)   Hypoxia  Diastolic Heart Failure: Grade 2 diastolic dysfunction on echo.  CTA negative for PE, ctm.  O2 being weaned, on 1/2 L by Tammy Fernandez at this point.  Holding diuresis at this time Strict I/O, daily weights  Severe Pulmonary Hypertension:  - CTA r/o PE with hypoxia - Pulm sent ANA, anti DNA, RF, CCP, anti GBM, ANCA, and antiscleroderma antibodies - pulm has signed off, "little for active intervention"  Tachycardia:  Mild sinus tach this morning.  Pt denies any pain.  Will need to watch with diuresis for above.    Lightheadedness:  Not clear etiology. She relates this to her allergies?  Will continue her home allergy meds.  She received NS overnight with concern for hypovolemia, but this has worsened her sodium as noted above.   Elevated Troponin: not clear why this was drawn, but likely related to demand from pt heart failure.  Only mildly elevated.  Pt denies every having  CP during this admission.  CTM.   Acute metabolic encephalopathy: improving . - Monitoring as noted above - will get head CT (no acute abnormality) - speech eval (recommending dysphagia 3 diet - planning ot continue to follow for tolerance and determine need for further esophageal w/u)  Essential hypertension: - holding lisinopril, propranolol, and amlodipine for now - Likely restart after we see how she does with diuresis  Allergies - Continue  Claritin, prn Benadryl, and Flonase  Hypothyroidism - Check TSH - wnl - Continue levothyroxine  Anxiety - Continue Xanax as needed  Tammy Fernandez - Continue Protonix  PICC placed for access.  Needs to be removed at discharge.   DVT prophylaxis: lovenox Code Status: full  Family Communication: son and daughter Disposition Plan: pending improvement   Consultants:   PCCM  Procedures:   none  Antimicrobials:  Anti-infectives (From admission, onward)   Start     Dose/Rate Route Frequency Ordered Stop   12/19/17 2330  demeclocycline (DECLOMYCIN) tablet 150 mg  Status:  Discontinued     150 mg Oral 2 times daily 12/19/17 2322 12/21/17 1517      Subjective: Tammy Fernandez&O x 3.  Denies pain. Notes persistent LH.   Objective: Vitals:   12/23/17 2200 12/24/17 0416 12/24/17 0420 12/24/17 0421  BP: (!) 183/87 (!) 162/90    Pulse: (!) 102 (!) 108    Resp: 20 16    Temp: (!) 97.5 F (36.4 C) 98.3 F (36.8 C)    TempSrc: Oral Oral    SpO2: 97% 98%    Weight:   83.8 kg (184 lb 11.9 oz) 83.8 kg (184 lb 11.9 oz)  Height:        Intake/Output Summary (Last 24 hours) at 12/24/2017 1234 Last data filed at 12/24/2017 0418 Gross per 24 hour  Intake 1449.83 ml  Output 3250 ml  Net -1800.17 ml   Filed Weights   12/21/17 1810 12/24/17 0420 12/24/17 0421  Weight: 89 kg (196 lb 3.4 oz) 83.8 kg (184 lb 11.9 oz) 83.8 kg (184 lb 11.9 oz)    Examination:  General: No acute distress.  Eating lunch with assistance of duaghter.  Cardiovascular: Heart sounds show Tammy Fernandez regular rate, and rhythm. No gallops or rubs. No murmurs.  Lungs: Clear to auscultation bilaterally with good air movement. No rales, rhonchi or wheezes. Abdomen: Soft, nontender, nondistended with normal active bowel sounds. No masses. No hepatosplenomegaly. Neurological: Alert and oriented 3. Moves all extremities 4 with equal strength. Cranial nerves II through XII grossly intact. Skin: Warm and dry. No rashes or  lesions. Extremities: No clubbing or cyanosis. Trace edema.  Psychiatric: Mood and affect are normal. Insight and judgment are appropriate.  Data Reviewed: I have personally reviewed following labs and imaging studies  CBC: Recent Labs  Lab 12/19/17 1912 12/21/17 0743 12/23/17 0403 12/24/17 0326  WBC 6.9 9.3 9.1 9.9  NEUTROABS 5.6  --   --   --   HGB 14.6 12.9 13.0 14.0  HCT 41.5 37.8 39.7 41.2  MCV 86.6 89.4 92.3 90.0  PLT 262 279 262 213   Basic Metabolic Panel: Recent Labs  Lab 12/19/17 2326  12/21/17 0743  12/22/17 0810  12/23/17 0403 12/23/17 1059 12/23/17 2334 12/24/17 0326 12/24/17 1058  NA 110*   < > 118*   < > 126*   < > 128* 126* 123* 122* 104*  K 4.7  --  4.7  --  4.0  --  3.3*  --   --  2.9*  --  CL 73*  --  82*  --  90*  --  78*  --   --  65*  --   CO2 30  --  30  --  29  --  42*  --   --  44*  --   GLUCOSE 144*  --  95  --  75  --  129*  --   --  138*  --   BUN 15  --  18  --  10  --  6  --   --  <5*  --   CREATININE 0.76  --  0.65  --  0.52  --  0.54  --   --  0.47  --   CALCIUM 8.5*  --  8.5*  --  8.3*  --  8.8*  --   --  8.8*  --   MG  --   --   --   --   --   --  1.4*  --   --  1.4*  --   PHOS  --   --   --   --   --   --  1.7*  --   --  1.7*  --    < > = values in this interval not displayed.   GFR: Estimated Creatinine Clearance: 50 mL/min (by C-G formula based on SCr of 0.47 mg/dL). Liver Function Tests: Recent Labs  Lab 12/19/17 1919 12/19/17 2326 12/23/17 0403  AST 30 22  --   ALT 11* 11*  --   ALKPHOS 72 79  --   BILITOT 1.1 0.7  --   PROT 7.0 7.6  --   ALBUMIN 3.3* 3.5 3.1*   No results for input(s): LIPASE, AMYLASE in the last 168 hours. No results for input(s): AMMONIA in the last 168 hours. Coagulation Profile: No results for input(s): INR, PROTIME in the last 168 hours. Cardiac Enzymes: Recent Labs  Lab 12/22/17 0813  TROPONINI 0.04*   BNP (last 3 results) No results for input(s): PROBNP in the last 8760  hours. HbA1C: No results for input(s): HGBA1C in the last 72 hours. CBG: No results for input(s): GLUCAP in the last 168 hours. Lipid Profile: No results for input(s): CHOL, HDL, LDLCALC, TRIG, CHOLHDL, LDLDIRECT in the last 72 hours. Thyroid Function Tests: No results for input(s): TSH, T4TOTAL, FREET4, T3FREE, THYROIDAB in the last 72 hours. Anemia Panel: No results for input(s): VITAMINB12, FOLATE, FERRITIN, TIBC, IRON, RETICCTPCT in the last 72 hours. Sepsis Labs: Recent Labs  Lab 12/22/17 0255  LATICACIDVEN 0.7    No results found for this or any previous visit (from the past 240 hour(s)).       Radiology Studies: No results found.      Scheduled Meds: . aspirin EC  81 mg Oral Daily  . cetirizine  10 mg Oral Daily  . enoxaparin (LOVENOX) injection  40 mg Subcutaneous QHS  . fluticasone  2 spray Each Nare Daily  . levothyroxine  50 mcg Oral QAC breakfast  . pantoprazole  40 mg Oral Daily  . polyethylene glycol  17 g Oral BID  . potassium chloride  40 mEq Oral Q4H  . sodium chloride flush  10-40 mL Intracatheter Q12H   Continuous Infusions: . potassium PHOSPHATE IVPB (mmol)       LOS: 5 days    Time spent: over 30 min    Fayrene Helper, MD [facility] Pager [phone]  If 7PM-7AM, please contact night-coverage [email or web address] Password [email or web address]  12/24/2017, 12:34 PM

## 2017-12-24 NOTE — Progress Notes (Signed)
Subjective: Interval History: looks better, but Na down again  Objective: Vital signs in last 24 hours: Temp:  [97.5 F (36.4 C)-98.3 F (36.8 C)] 98.2 F (36.8 C) (01/22 1238) Pulse Rate:  [86-108] 86 (01/22 1238) Resp:  [16-20] 16 (01/22 0416) BP: (159-183)/(66-90) 159/66 (01/22 1238) SpO2:  [93 %-98 %] 93 % (01/22 1238) Weight:  [83.8 kg (184 lb 11.9 oz)] 83.8 kg (184 lb 11.9 oz) (01/22 0421) Weight change:   Intake/Output from previous day: 01/21 0701 - 01/22 0700 In: 1579.8 [P.O.:360; I.V.:709.8; IV Piggyback:510] Out: 3250 [Urine:3250] Intake/Output this shift: No intake/output data recorded.  General appearance: tired, washcloth on the forehead, not in distress Resp: diminished breath sounds bilaterally Cardio: S1, S2 normal and systolic murmur: systolic ejection 2/6, decrescendo at 2nd left intercostal space GI: obese, pos bs, soft, incision lower abdm Extremities: no leg edema  Lab Results: Recent Labs    12/23/17 0403 12/24/17 0326  WBC 9.1 9.9  HGB 13.0 14.0  HCT 39.7 41.2  PLT 262 247   BMET:  Recent Labs    12/23/17 0403  12/24/17 0326 12/24/17 1058 12/24/17 1247  NA 128*   < > 122* 104* 118*  K 3.3*  --  2.9*  --   --   CL 78*  --  65*  --   --   CO2 42*  --  44*  --   --   GLUCOSE 129*  --  138*  --   --   BUN 6  --  <5*  --   --   CREATININE 0.54  --  0.47  --   --   CALCIUM 8.8*  --  8.8*  --   --    < > = values in this interval not displayed.   No results for input(s): PTH in the last 72 hours. Iron Studies: No results for input(s): IRON, TIBC, TRANSFERRIN, FERRITIN in the last 72 hours.  Studies/Results: Dg Chest Port 1 View  Result Date: 12/24/2017 CLINICAL DATA:  Hypoxia. EXAM: PORTABLE CHEST 1 VIEW COMPARISON:  CT 12/21/2016.  Chest x-ray 12/21/2016. FINDINGS: Right PICC line noted with tip over superior vena cava. Heart size normal. Right lower lobe atelectasis with elevation right hemidiaphragm. No change from prior exam. No acute  infiltrate. No pleural effusion or pneumothorax. IMPRESSION: 1.  Right PICC line noted with tip over superior vena cava. 2. Unchanged right lower lobe atelectasis with elevation right hemidiaphragm. Similar findings noted on prior exams. Electronically Signed   By: Maisie Fushomas  Register   On: 12/24/2017 13:47    I have reviewed the patient's current medications.  Assessment/Plan: 1 Hyponatremia . Mixed picture of initially hypovol then hyper volemic hyponatremia. Got diuretics, put out 5L in 24 hrs w hypochloremic alkalosis, gave NS but Na now low again.  Had low UNa's, not sure etiology but likely SIADH given hx of same problems x 4-5 yrs per family.  Getting IV 3% saline , moving to SDU.  Looks better today.   2 Obesity  3 HTN 5 Hypothyroid TSH ok 6 CAD 7 CVA  P - 3% saline, see orders per primary   LOS: 5 days   Maree KrabbeRobert D Rehman Levinson 12/24/2017,3:24 PM

## 2017-12-25 ENCOUNTER — Encounter (HOSPITAL_COMMUNITY): Payer: Self-pay | Admitting: Family Medicine

## 2017-12-25 DIAGNOSIS — I5033 Acute on chronic diastolic (congestive) heart failure: Secondary | ICD-10-CM

## 2017-12-25 DIAGNOSIS — E039 Hypothyroidism, unspecified: Secondary | ICD-10-CM

## 2017-12-25 DIAGNOSIS — I5032 Chronic diastolic (congestive) heart failure: Secondary | ICD-10-CM

## 2017-12-25 DIAGNOSIS — J9600 Acute respiratory failure, unspecified whether with hypoxia or hypercapnia: Secondary | ICD-10-CM

## 2017-12-25 DIAGNOSIS — E871 Hypo-osmolality and hyponatremia: Secondary | ICD-10-CM

## 2017-12-25 DIAGNOSIS — I2721 Secondary pulmonary arterial hypertension: Secondary | ICD-10-CM

## 2017-12-25 HISTORY — DX: Secondary pulmonary arterial hypertension: I27.21

## 2017-12-25 LAB — CBC
HEMATOCRIT: 36.6 % (ref 36.0–46.0)
HEMOGLOBIN: 12.3 g/dL (ref 12.0–15.0)
MCH: 30 pg (ref 26.0–34.0)
MCHC: 33.6 g/dL (ref 30.0–36.0)
MCV: 89.3 fL (ref 78.0–100.0)
Platelets: 234 10*3/uL (ref 150–400)
RBC: 4.1 MIL/uL (ref 3.87–5.11)
RDW: 13.3 % (ref 11.5–15.5)
WBC: 11.4 10*3/uL — AB (ref 4.0–10.5)

## 2017-12-25 LAB — SODIUM, URINE, RANDOM
Sodium, Ur: 70 mmol/L
Sodium, Ur: 44 mmol/L

## 2017-12-25 LAB — BASIC METABOLIC PANEL
Anion gap: 7 (ref 5–15)
BUN: 5 mg/dL — ABNORMAL LOW (ref 6–20)
CHLORIDE: 70 mmol/L — AB (ref 101–111)
CO2: 45 mmol/L — ABNORMAL HIGH (ref 22–32)
Calcium: 8.7 mg/dL — ABNORMAL LOW (ref 8.9–10.3)
Creatinine, Ser: 0.6 mg/dL (ref 0.44–1.00)
GFR calc Af Amer: >60 mL/min (ref >60)
GFR calc non Af Amer: >60 mL/min (ref >60)
Glucose, Bld: 118 mg/dL — ABNORMAL HIGH (ref 65–99)
POTASSIUM: 4.1 mmol/L (ref 3.5–5.1)
SODIUM: 122 mmol/L — AB (ref 135–145)

## 2017-12-25 LAB — OSMOLALITY, URINE
Osmolality, Ur: 401 mOsm/kg (ref 300–900)
Osmolality, Ur: 381 mOsm/kg (ref 300–900)

## 2017-12-25 LAB — SODIUM
Sodium: 123 mmol/L — ABNORMAL LOW (ref 135–145)
Sodium: 121 mmol/L — ABNORMAL LOW (ref 135–145)

## 2017-12-25 MED ORDER — SENNA 8.6 MG PO TABS
1.0000 | ORAL_TABLET | Freq: Every day | ORAL | Status: DC
  Administered 2017-12-26 – 2017-12-29 (×3): 8.6 mg via ORAL
  Filled 2017-12-25 (×3): qty 1

## 2017-12-25 MED ORDER — NYSTATIN 100000 UNIT/ML MT SUSP
5.0000 mL | Freq: Four times a day (QID) | OROMUCOSAL | Status: DC
  Administered 2017-12-25 – 2017-12-29 (×17): 500000 [IU] via ORAL
  Filled 2017-12-25 (×15): qty 5

## 2017-12-25 MED ORDER — TOLVAPTAN 15 MG PO TABS: 15.0000 mg | ORAL_TABLET | ORAL | Status: DC

## 2017-12-25 MED ORDER — BISACODYL 10 MG RE SUPP
10.0000 mg | Freq: Once | RECTAL | Status: AC
Start: 2017-12-25 — End: 2017-12-25
  Administered 2017-12-25: 10 mg via RECTAL
  Filled 2017-12-25: qty 1

## 2017-12-25 MED ORDER — ALPRAZOLAM 0.25 MG PO TABS
0.2500 mg | ORAL_TABLET | Freq: Two times a day (BID) | ORAL | Status: DC
  Administered 2017-12-25 – 2017-12-29 (×10): 0.25 mg via ORAL
  Filled 2017-12-25 (×10): qty 1

## 2017-12-25 MED ORDER — PROPRANOLOL HCL ER 60 MG PO CP24
60.0000 mg | ORAL_CAPSULE | Freq: Every day | ORAL | Status: DC
  Administered 2017-12-25 – 2017-12-29 (×5): 60 mg via ORAL
  Filled 2017-12-25 (×5): qty 1

## 2017-12-25 MED ORDER — SODIUM CHLORIDE 0.9 % IV SOLN
INTRAVENOUS | Status: AC
  Administered 2017-12-26: 02:00:00 via INTRAVENOUS

## 2017-12-25 MED ORDER — FLEET ENEMA 7-19 GM/118ML RE ENEM
1.0000 | ENEMA | Freq: Once | RECTAL | Status: AC
  Administered 2017-12-25: 1 via RECTAL
  Filled 2017-12-25: qty 1

## 2017-12-25 NOTE — Progress Notes (Signed)
Date: December 25, 2017 Marcelle SmilingRhonda Sutton Hirsch, BSN, NewaldRN3, [place]CCM [phone] Chart and notes review for patient progress and needs. Transferred from floor down to sdu due to hyponatremia and increased confusion. Will follow for case management and discharge needs. No cm or discharge needs present at time of this review. Next review date: [id number]

## 2017-12-25 NOTE — Progress Notes (Signed)
Subjective: Interval History: Na up on 3%, stopped this am, pt confused  Objective: Vital signs in last 24 hours: Temp:  [98.2 F (36.8 C)-98.7 F (37.1 C)] 98.3 F (36.8 C) (01/23 0800) Pulse Rate:  [86-109] 108 (01/23 0400) Resp:  [14-29] 19 (01/23 0400) BP: (108-197)/(45-88) 176/66 (01/23 0902) SpO2:  [93 %-95 %] 95 % (01/23 0400) Weight:  [84 kg (185 lb 3 oz)] 84 kg (185 lb 3 oz) (01/23 0500) Weight change: 0.2 kg (7.1 oz)  Intake/Output from previous day: 01/22 0701 - 01/23 0700 In: 535 [I.V.:25; IV Piggyback:510] Out: 300 [Urine:300] Intake/Output this shift: No intake/output data recorded.  General appearance: tired, washcloth on the forehead, not in distress Resp: diminished breath sounds bilaterally Cardio: S1, S2 normal and systolic murmur: systolic ejection 2/6, decrescendo at 2nd left intercostal space GI: obese, pos bs, soft, incision lower abdm Extremities: no leg edema  Lab Results: Recent Labs    12/24/17 0326 12/25/17 0611  WBC 9.9 11.4*  HGB 14.0 12.3  HCT 41.2 36.6  PLT 247 234   BMET:  Recent Labs    12/24/17 0326  12/25/17 0157 12/25/17 0611  NA 122*   < > 123* 122*  K 2.9*  --   --  4.1  CL 65*  --   --  70*  CO2 44*  --   --  45*  GLUCOSE 138*  --   --  118*  BUN <5*  --   --  5*  CREATININE 0.47  --   --  0.60  CALCIUM 8.8*  --   --  8.7*   < > = values in this interval not displayed.   No results for input(s): PTH in the last 72 hours. Iron Studies: No results for input(s): IRON, TIBC, TRANSFERRIN, FERRITIN in the last 72 hours.  Studies/Results: Dg Chest Port 1 View  Result Date: 12/24/2017 CLINICAL DATA:  Hypoxia. EXAM: PORTABLE CHEST 1 VIEW COMPARISON:  CT 12/21/2016.  Chest x-ray 12/21/2016. FINDINGS: Right PICC line noted with tip over superior vena cava. Heart size normal. Right lower lobe atelectasis with elevation right hemidiaphragm. No change from prior exam. No acute infiltrate. No pleural effusion or pneumothorax.  IMPRESSION: 1.  Right PICC line noted with tip over superior vena cava. 2. Unchanged right lower lobe atelectasis with elevation right hemidiaphragm. Similar findings noted on prior exams. Electronically Signed   By: Maisie Fushomas  Register   On: 12/24/2017 13:47    I have reviewed the patient's current medications.  Assessment/Plan: 1 Hyponatremia . Mixed picture of initially hypovol then hyper volemic hyponatremia. Got diuretics, put out 5L in 24 hrs w hypochloremic alkalosis. Gave IVF's and Na dropped again. Started 3% yest and Na better, but needs more Rx to get Na up, will give tolvaptan and also some NS 0.9% to replace chloride.  Urine lytes now c/w SIADH. Has been here 2-3 times in past w/ severe hypoNa+ attributed to hypovolemia and /or SIADH.   2 Obesity  3 HTN 5 Hypothyroid TSH ok 6 CAD 7 CVA  P - as above   LOS: 6 days   Maree Krabbeobert D Lytle Malburg 12/25/2017,9:43 AM

## 2017-12-25 NOTE — Progress Notes (Addendum)
PROGRESS NOTE  Tammy Fernandez XBJ:[id number] DOB: 07-17-1924 DOA: 12/19/2017 PCP: Elias Else, MD  Brief Narrative:  Assessment/Plan Hyponatremia  - some nausea, dizziness, but relatively asymptomatic - discussed with Dr. Arlean Hopping, plans tolvaptan, then salt tabs, furosemide and fluid restriction; serial BMP   Acute hypoxic resp failure. Previously on oxygen at home, but weaned off. - suspect more chronic in nature, likely secondary to severe pulmonary HTN - wean as tolerated  Chronic diastolic CHF - 6L since admission; no peripheral edema, appears compesnated  Severe pulmonary hypertension - f/u as outpatient  Elevated troponin  Dysphagia. Possible thrush.  - continue diet per ST - empiric Nystatin  Hypothyroidism. TSH WNL.  Constipation - Miralax BID; add Senna QHS, dulcolax suppository today x1  Anxiety - takes Xanax .25 BID scheduled at home; will change to scheduled here. May be contributing to tachycardia.   DVT prophylaxis: enoxaparin Code Status: full Family Communication: son at bedside Disposition Plan: home    Brendia Sacks, MD  [facility] Direct contact: [phone] --Via amion app OR  --[email or web address]; password [email or web address]  7PM-7AM contact night coverage as above 12/25/2017, 9:50 AM  LOS: 6 days   Consultants:  Nephrology   Procedures:  Echo Study Conclusions  - Left ventricle: The cavity size was normal. There was moderate   focal basal hypertrophy. Systolic function was normal. The   estimated ejection fraction was in the range of 60% to 65%. Wall   motion was normal; there were no regional wall motion   abnormalities. Features are consistent with a pseudonormal left   ventricular filling pattern, with concomitant abnormal relaxation   and increased filling pressure (grade 2 diastolic dysfunction).   Doppler parameters are consistent with high ventricular filling   pressure. - Aortic valve: There was mild regurgitation. - Right  ventricle: The cavity size was mildly dilated. Wall   thickness was normal. Systolic function was moderately reduced. - Right atrium: The atrium was mildly dilated. - Tricuspid valve: There was moderate regurgitation. - Pulmonary arteries: PA peak pressure: 72 mm Hg (S). - Impressions: The right ventricular systolic pressure was   increased consistent with severe pulmonary hypertension.  Impressions:  - The right ventricular systolic pressure was increased consistent   with severe pulmonary hypertension.  Antimicrobials:    Interval history/Subjective: Nephrology managing hyponatremia.   Feels lightheaded. Some nausea. No vomiting now. Tolerating breakfast (oatmeal, yogurt). Dysphagia/odynophagia. Breathing ok. No CP currently.  Objective: Vitals:  Vitals:   12/25/17 0800 12/25/17 0902  BP:  (!) 176/66  Pulse:    Resp:    Temp: 98.3 F (36.8 C)   SpO2:      Exam:  Constitutional:  . Appears calm and comfortable Eyes:  . pupils and irises appear normal . Normal lids a  ENMT:  . grossly normal hearing  . Lips appear normal Neck:  . neck appears normal, no masses  . no thyromegaly Respiratory:  . CTA bilaterally, no w/r/r.  . Respiratory effort normal.  Cardiovascular:  . Tachycardic, regular rhythm, no m/r/g . No LE extremity edema   Abdomen:  . Soft, ntnd Musculoskeletal:  . Digits/nails BUE: no clubbing, cyanosis, petechiae, infection . RUE, LUE, RLE, LLE   o strength and tone normal, no atrophy, no abnormal movements Psychiatric:  . Mental status o Mood, affect appropriate o Orientation to person, WL, month, 22nd, year    I have personally reviewed the following:  Filed Weights   12/24/17 0420 12/24/17 0421 12/25/17 0500  Weight: 83.8 kg (184  lb 11.9 oz) 83.8 kg (184 lb 11.9 oz) 84 kg (185 lb 3 oz)   Weight change: 0.2 kg (7.1 oz)  UOP: 300 I/O since admission: -6200 Last BM charted: none Foley:  Telemetry: ST Status:  SDU  Labs:  Sodium 122 >> 123 >> 122  Chloride 70  K+ WNL  Creatinine WNL  CBC noted  Imaging studies:  CXR independently reviewed, elevated right hemidiaphragm, NAD, NSCSLT 1/19   Scheduled Meds: . ALPRAZolam  0.25 mg Oral BID  . amLODipine  10 mg Oral Daily  . aspirin EC  81 mg Oral Daily  . bisacodyl  10 mg Rectal Once  . cetirizine  10 mg Oral Daily  . enoxaparin (LOVENOX) injection  40 mg Subcutaneous QHS  . fluticasone  2 spray Each Nare Daily  . levothyroxine  50 mcg Oral QAC breakfast  . nystatin  5 mL Oral QID  . pantoprazole  40 mg Oral Daily  . polyethylene glycol  17 g Oral BID  . propranolol ER  60 mg Oral Daily  . senna  1 tablet Oral QHS  . sodium chloride flush  10-40 mL Intracatheter Q12H   Continuous Infusions: . sodium chloride      Active Problems:   Acute hyponatremia   Acute respiratory failure with hypoxia (HCC)   Essential hypertension   Hypothyroidism   Chronic diastolic CHF (congestive heart failure) (HCC)   Severe pulmonary arterial systolic hypertension (HCC)   LOS: 6 days

## 2017-12-26 LAB — BASIC METABOLIC PANEL
ANION GAP: 8 (ref 5–15)
BUN: 9 mg/dL (ref 6–20)
CO2: 42 mmol/L — AB (ref 22–32)
Calcium: 8.8 mg/dL — ABNORMAL LOW (ref 8.9–10.3)
Chloride: 72 mmol/L — ABNORMAL LOW (ref 101–111)
Creatinine, Ser: 0.57 mg/dL (ref 0.44–1.00)
GFR calc Af Amer: >60 mL/min (ref >60)
GFR calc non Af Amer: >60 mL/min (ref >60)
GLUCOSE: 102 mg/dL — AB (ref 65–99)
POTASSIUM: 4.4 mmol/L (ref 3.5–5.1)
Sodium: 122 mmol/L — ABNORMAL LOW (ref 135–145)

## 2017-12-26 LAB — SODIUM, URINE, RANDOM: Sodium, Ur: 113 mmol/L

## 2017-12-26 LAB — OSMOLALITY, URINE: Osmolality, Ur: 323 mOsm/kg (ref 300–900)

## 2017-12-26 LAB — SODIUM: Sodium: 122 mmol/L — ABNORMAL LOW (ref 135–145)

## 2017-12-26 MED ORDER — SODIUM CHLORIDE 1 G PO TABS
2.0000 g | ORAL_TABLET | Freq: Three times a day (TID) | ORAL | Status: DC
  Administered 2017-12-26 (×2): 2 g via ORAL
  Filled 2017-12-26 (×4): qty 2

## 2017-12-26 MED ORDER — CHLORHEXIDINE GLUCONATE CLOTH 2 % EX PADS
6.0000 | MEDICATED_PAD | Freq: Every day | CUTANEOUS | Status: DC
  Administered 2017-12-26 – 2017-12-28 (×2): 6 via TOPICAL

## 2017-12-26 MED ORDER — SODIUM CHLORIDE 1 G PO TABS
2.0000 g | ORAL_TABLET | Freq: Three times a day (TID) | ORAL | Status: DC
  Administered 2017-12-26: 2 g via ORAL
  Filled 2017-12-26 (×2): qty 2

## 2017-12-26 MED ORDER — FUROSEMIDE 20 MG PO TABS
10.0000 mg | ORAL_TABLET | Freq: Every day | ORAL | Status: DC
  Administered 2017-12-26 – 2017-12-27 (×2): 10 mg via ORAL
  Filled 2017-12-26 (×2): qty 1

## 2017-12-26 MED ORDER — SODIUM CHLORIDE 1 G PO TABS: 2.0000 g | ORAL_TABLET | Freq: Three times a day (TID) | ORAL | Status: DC

## 2017-12-26 NOTE — Progress Notes (Signed)
Subjective: Interval History: Na no better or worse today,  Mental status better, O x 3 today, hasn't been out of bed.  Was walking w a walker at home.   Objective: Vital signs in last 24 hours: Temp:  [97.7 F (36.5 C)-98.8 F (37.1 C)] 97.7 F (36.5 C) (01/24 1135) Pulse Rate:  [64-91] 73 (01/24 1200) Resp:  [9-27] 23 (01/24 1200) BP: (108-158)/(38-96) 136/42 (01/24 1200) SpO2:  [87 %-98 %] 97 % (01/24 1200) Weight:  [84.9 kg (187 lb 2.7 oz)] 84.9 kg (187 lb 2.7 oz) (01/24 0500) Weight change: 0.9 kg (1 lb 15.8 oz)  Intake/Output from previous day: 01/23 0701 - 01/24 0700 In: 1684.2 [P.O.:840; I.V.:844.2] Out: 1450 [Urine:1450] Intake/Output this shift: Total I/O In: 20 [I.V.:20] Out: -   Gen: elderly AAF, no distress, frail Resp: clear bilat to bases Cardio: RRR 2/6 sem GI: obese, pos bs, soft, incision lower abdm Extremities: no leg edema  Lab Results: Recent Labs    12/24/17 0326 12/25/17 0611  WBC 9.9 11.4*  HGB 14.0 12.3  HCT 41.2 36.6  PLT 247 234   BMET:  Recent Labs    12/25/17 0611 12/25/17 1815 12/26/17 0459  NA 122* 121* 122*  K 4.1  --  4.4  CL 70*  --  72*  CO2 45*  --  42*  GLUCOSE 118*  --  102*  BUN 5*  --  9  CREATININE 0.60  --  0.57  CALCIUM 8.7*  --  8.8*   No results for input(s): PTH in the last 72 hours. Iron Studies: No results for input(s): IRON, TIBC, TRANSFERRIN, FERRITIN in the last 72 hours.  Studies/Results: No results found.  I have reviewed the patient's current medications.  Assessment: 1 Hyponatremia - has been here 2-3 times in past w/ severe hypoNa+ attributed to hypovolemia and /or SIADH.  Mixed picture of initially hypovol then hyper volemic hyponatremia. Got diuretics and put out 5L in 24 hrs, developed hypochloremic alkalosis. Then got 3% saline and one dose tolvaptan.  Urine Na now > 40 and Uosm has been 350 - 500 range (^).  Serum Na stable 122, goal 127 or higher. Plan salt tabs/ po lasix 10 qd and 800 cc  fluid restriction.   2 Obesity  3 HTN 5 Hypothyroid TSH ok 6 CAD 7 CVA  P - as above   LOS: 7 days   Maree KrabbeRobert D Kiya Eno 12/26/2017,2:08 PM

## 2017-12-26 NOTE — Progress Notes (Signed)
PROGRESS NOTE  Tammy [name] ZOX:[id number] DOB: [birth year] DOA: 12/19/2017 PCP: Elias Elseeade, Robert, MD  Assessment/Plan Hyponatremia  - no change today; still some nausea - started on salt tabs, Lasix today by nephrology. Management per nephrology.  Acute hypoxic resp failure. Probably really chronic. Previously on oxygen at home, but weaned off. - likely secondary to severe pulmonary HTN - continue supplemental oxygen, wean as tolerated  Chronic diastolic CHF - minus 6 L since admission; no peripheral edema, mild pulmonary crackles  Severe pulmonary hypertension - f/u as outpatient   Dysphagia. Possible thrush.  - continue diet per ST - continue empiric Nystatin  Hypothyroidism. TSH WNL. - continue levothyroxine  Constipation - relieved with enema. Continue Miralax BID, Senna QHS  Anxiety. Takes Xanax .25 BID scheduled at home; - continue Xanax   DVT prophylaxis: enoxaparin Code Status: full Family Communication: family at bedside Disposition Plan: home    Brendia Sacksaniel Goodrich, MD  [facility] Direct contact: [phone] --Via amion app OR  --[email or web address]; password [email or web address]  7PM-7AM contact night coverage as above 12/26/2017, 9:36 AM  LOS: 7 days   Consultants:  Nephrology   Procedures:  Echo Study Conclusions  - Left ventricle: The cavity size was normal. There was moderate   focal basal hypertrophy. Systolic function was normal. The   estimated ejection fraction was in the range of 60% to 65%. Wall   motion was normal; there were no regional wall motion   abnormalities. Features are consistent with a pseudonormal left   ventricular filling pattern, with concomitant abnormal relaxation   and increased filling pressure (grade 2 diastolic dysfunction).   Doppler parameters are consistent with high ventricular filling   pressure. - Aortic valve: There was mild regurgitation. - Right ventricle: The cavity size was mildly dilated. Wall   thickness was  normal. Systolic function was moderately reduced. - Right atrium: The atrium was mildly dilated. - Tricuspid valve: There was moderate regurgitation. - Pulmonary arteries: PA peak pressure: 72 mm Hg (S). - Impressions: The right ventricular systolic pressure was   increased consistent with severe pulmonary hypertension.  Impressions:  - The right ventricular systolic pressure was increased consistent   with severe pulmonary hypertension.  Antimicrobials:    Interval history/Subjective: C/o constipation overnight, relieved with enema.  Feels SOB this AM. Some nausea, vomited earlier.  Objective: Vitals:  Vitals:   12/26/17 0600 12/26/17 0730  BP: (!) 148/51   Pulse: 76   Resp: (!) 23   Temp:  98.6 F (37 C)  SpO2: 96%     Exam: Constitutional:   . Appears calm, mildly uncomfortable Eyes:  . pupils and irises appear normal . Normal lids ENMT:  . grossly normal hearing  Respiratory:  . Few inspiratory crackles on right, otherwise CTA . Respiratory effort normal.  Cardiovascular:  . RRR, no m/r/g . No LE extremity edema   Psychiatric:  . Mental status o Mood, affect appropriate o Orientation to person, WL, month, not year  I have personally reviewed the following:  Filed Weights   12/24/17 0421 12/25/17 0500 12/26/17 0500  Weight: 83.8 kg (184 lb 11.9 oz) 84 kg (185 lb 3 oz) 84.9 kg (187 lb 2.7 oz)   Weight change: 0.9 kg (1 lb 15.8 oz)  UOP: 1450 I/O since admission: -6200 Last BM charted: 1/23 Foley: no Telemetry: SR Status: SDU  Labs:  Sodium 122 >> 123 >> 122 >> 122  Chloride 70 >> 72  Creatinine WNL 124  Imaging studies:  Scheduled Meds: . ALPRAZolam  0.25 mg Oral BID  . amLODipine  10 mg Oral Daily  . aspirin EC  81 mg Oral Daily  . cetirizine  10 mg Oral Daily  . Chlorhexidine Gluconate Cloth  6 each Topical Daily  . enoxaparin (LOVENOX) injection  40 mg Subcutaneous QHS  . fluticasone  2 spray Each Nare Daily  .  furosemide  10 mg Oral Daily  . levothyroxine  50 mcg Oral QAC breakfast  . nystatin  5 mL Oral QID  . pantoprazole  40 mg Oral Daily  . polyethylene glycol  17 g Oral BID  . propranolol ER  60 mg Oral Daily  . senna  1 tablet Oral QHS  . sodium chloride flush  10-40 mL Intracatheter Q12H  . sodium chloride  2 g Oral TID WC   Continuous Infusions:   Principal Problem:   Acute hyponatremia Active Problems:   Acute respiratory failure with hypoxia (HCC)   Essential hypertension   Hypothyroidism   Chronic diastolic CHF (congestive heart failure) (HCC)   Severe pulmonary arterial systolic hypertension (HCC)   LOS: 7 days

## 2017-12-26 NOTE — Progress Notes (Addendum)
Patient c/o severe constipation. She has not had a BM since 12/16/17. Patient requested enema. Paged Craige CottaKirby, NP. New order for one time fleets enema. Administered fleets enema. Patient had large brown bowel movement. Continue to monitor.

## 2017-12-27 LAB — BASIC METABOLIC PANEL
ANION GAP: 8 (ref 5–15)
BUN: 11 mg/dL (ref 6–20)
CALCIUM: 8.5 mg/dL — AB (ref 8.9–10.3)
CO2: 40 mmol/L — ABNORMAL HIGH (ref 22–32)
Chloride: 71 mmol/L — ABNORMAL LOW (ref 101–111)
Creatinine, Ser: 0.57 mg/dL (ref 0.44–1.00)
GLUCOSE: 78 mg/dL (ref 65–99)
POTASSIUM: 4.8 mmol/L (ref 3.5–5.1)
SODIUM: 119 mmol/L — AB (ref 135–145)

## 2017-12-27 LAB — SODIUM
SODIUM: 117 mmol/L — AB (ref 135–145)
SODIUM: 120 mmol/L — AB (ref 135–145)
SODIUM: 126 mmol/L — AB (ref 135–145)
Sodium: 123 mmol/L — ABNORMAL LOW (ref 135–145)

## 2017-12-27 LAB — OSMOLALITY, URINE: OSMOLALITY UR: 294 mosm/kg — AB (ref 300–900)

## 2017-12-27 MED ORDER — DEMECLOCYCLINE HCL 150 MG PO TABS
300.0000 mg | ORAL_TABLET | Freq: Two times a day (BID) | ORAL | Status: DC
  Administered 2017-12-27 – 2017-12-29 (×5): 300 mg via ORAL
  Filled 2017-12-27 (×5): qty 2

## 2017-12-27 MED ORDER — SODIUM CHLORIDE 3 % IV SOLN: INTRAVENOUS | Status: DC

## 2017-12-27 MED ORDER — SODIUM CHLORIDE 1 G PO TABS
1.0000 g | ORAL_TABLET | Freq: Three times a day (TID) | ORAL | Status: DC
  Administered 2017-12-27 – 2017-12-28 (×4): 1 g via ORAL
  Filled 2017-12-27 (×5): qty 1

## 2017-12-27 MED ORDER — SODIUM CHLORIDE 3 % IV SOLN
INTRAVENOUS | Status: AC
  Administered 2017-12-27: 50 mL/h via INTRAVENOUS
  Filled 2017-12-27 (×5): qty 500

## 2017-12-27 MED ORDER — BISACODYL 10 MG RE SUPP
10.0000 mg | Freq: Once | RECTAL | Status: AC
  Administered 2017-12-27: 10 mg via RECTAL
  Filled 2017-12-27: qty 1

## 2017-12-27 NOTE — Progress Notes (Signed)
Subjective: Interval History: Na no better or worse today,  Mental status better, O x 3 today, hasn't been out of bed.  Was walking w a walker at home.   Objective: Vital signs in last 24 hours: Temp:  [97.4 F (36.3 C)-98.3 F (36.8 C)] 98 F (36.7 C) (01/25 1200) Pulse Rate:  [63-78] 73 (01/25 1000) Resp:  [10-23] 23 (01/25 1000) BP: (126-177)/(43-71) 160/47 (01/25 1000) SpO2:  [96 %-100 %] 97 % (01/25 1000) Weight:  [83.1 kg (183 lb 3.2 oz)] 83.1 kg (183 lb 3.2 oz) (01/25 0400) Weight change: -1.8 kg (-15.5 oz)  Intake/Output from previous day: 01/24 0701 - 01/25 0700 In: 455 [P.O.:435; I.V.:20] Out: 1550 [Urine:1550] Intake/Output this shift: Total I/O In: 734.2 [P.O.:480; I.V.:254.2] Out: 600 [Urine:600]  Gen: elderly AAF, no distress, frail Resp: clear bilat to bases Cardio: RRR 2/6 sem GI: obese, pos bs, soft, incision lower abdm Extremities: no leg edema  Lab Results: Recent Labs    12/25/17 0611  WBC 11.4*  HGB 12.3  HCT 36.6  PLT 234   BMET:  Recent Labs    12/26/17 0459  12/27/17 0500 12/27/17 1200  NA 122*   < > 119* 120*  K 4.4  --  4.8  --   CL 72*  --  71*  --   CO2 42*  --  40*  --   GLUCOSE 102*  --  78  --   BUN 9  --  11  --   CREATININE 0.57  --  0.57  --   CALCIUM 8.8*  --  8.5*  --    < > = values in this interval not displayed.   No results for input(s): PTH in the last 72 hours. Iron Studies: No results for input(s): IRON, TIBC, TRANSFERRIN, FERRITIN in the last 72 hours.  Studies/Results: No results found.  I have reviewed the patient's current medications.  Assessment: 1 Hyponatremia - mixed picture, treating now as SIADH. Na didn't respond to tolvaptan x 1, and didn't improve overnight on salt tabs/ lasix/ fluid restrict.  Will restart 3% for acute and start demeclocycline as well for long term.  2 Obesity  3 HTN 5 Hypothyroid TSH ok 6 CAD 7 CVA  P - as above   LOS: 8 days   Tammy Fernandez D Tammy Fernandez 12/27/2017,2:18  PM

## 2017-12-27 NOTE — Progress Notes (Signed)
CRITICAL VALUE ALERT  Critical Value:  Na 119  Date & Time Notied:  now  Provider Notified: Irene LimboGoodrich  Orders Received/Actions taken: No new orders now

## 2017-12-27 NOTE — Progress Notes (Signed)
PROGRESS NOTE  Tammy Fernandez ZOX:[id number] DOB: 1924/09/13 DOA: 12/19/2017 PCP: Elias Else, MD  Assessment/Plan Hyponatremia, thought secondary to SIADH. - relatively asymptomatic though sodium down to 119. Nephrology has restarted 3% saline and demeclocycline.   Acute hypoxic resp failure. Probably really chronic. Previously on oxygen at home, but weaned off. - wean as tolerated  Chronic diastolic CHF - appears compensated  Severe pulmonary hypertension - f/u as outpatient   Dysphagia. Possible thrush.  - continue diet per ST - continue empiric Nystatin  Hypothyroidism. TSH WNL. - continue levothyroxine 1/25  Constipation - relieved with enema. Continue Miralax BID, Senna QHS 1/25  Anxiety. Takes Xanax .25 BID scheduled at home; - continue Xanax 1/25  DVT prophylaxis: enoxaparin Code Status: full Family Communication: family at bedside 1/25 Disposition Plan: home    Brendia Sacks, MD  [facility] Direct contact: [phone] --Via amion app OR  --[email or web address]; password [email or web address]  7PM-7AM contact night coverage as above 12/27/2017, 11:33 AM  LOS: 8 days   Consultants:  Nephrology   Procedures:  Echo Study Conclusions  - Left ventricle: The cavity size was normal. There was moderate   focal basal hypertrophy. Systolic function was normal. The   estimated ejection fraction was in the range of 60% to 65%. Wall   motion was normal; there were no regional wall motion   abnormalities. Features are consistent with a pseudonormal left   ventricular filling pattern, with concomitant abnormal relaxation   and increased filling pressure (grade 2 diastolic dysfunction).   Doppler parameters are consistent with high ventricular filling   pressure. - Aortic valve: There was mild regurgitation. - Right ventricle: The cavity size was mildly dilated. Wall   thickness was normal. Systolic function was moderately reduced. - Right atrium: The atrium was mildly  dilated. - Tricuspid valve: There was moderate regurgitation. - Pulmonary arteries: PA peak pressure: 72 mm Hg (S). - Impressions: The right ventricular systolic pressure was   increased consistent with severe pulmonary hypertension.  Impressions:  - The right ventricular systolic pressure was increased consistent   with severe pulmonary hypertension.  Antimicrobials:    Interval history/Subjective: Feels poorly today. Some vomiting earlier. No HA. Breathing better.  Objective: Vitals:  Vitals:   12/27/17 0900 12/27/17 1000  BP:  (!) 160/47  Pulse: 78 73  Resp: 17 (!) 23  Temp:    SpO2: 97% 97%    Exam:  Constitutional:   . Appears calm and comfortable Respiratory:  . CTA bilaterally, no w/r/r.  . Respiratory effort normal.  Cardiovascular:  . RRR, no m/r/g . No LE extremity edema   Abdomen:  . soft Musculoskeletal:  . RUE, LUE, RLE, LLE   o strength and tone normal, no atrophy, no abnormal movements Psychiatric:  . Mental status o Mood, affect appropriate  I have personally reviewed the following:  Filed Weights   12/25/17 0500 12/26/17 0500 12/27/17 0400  Weight: 84 kg (185 lb 3 oz) 84.9 kg (187 lb 2.7 oz) 83.1 kg (183 lb 3.2 oz)   Weight change: -1.8 kg (-15.5 oz)  UOP: 1550 1/25 I/O since admission: -7130 Last BM charted: 1/23 Foley: no Telemetry: SR Status: SDU  Labs:  Sodium 122 >> 122 >> 117 >> 119  Chloride 70 >> 72 >> 71  Imaging studies:     Scheduled Meds: . ALPRAZolam  0.25 mg Oral BID  . amLODipine  10 mg Oral Daily  . aspirin EC  81 mg Oral Daily  . cetirizine  10  mg Oral Daily  . Chlorhexidine Gluconate Cloth  6 each Topical Daily  . enoxaparin (LOVENOX) injection  40 mg Subcutaneous QHS  . fluticasone  2 spray Each Nare Daily  . furosemide  10 mg Oral Daily  . levothyroxine  50 mcg Oral QAC breakfast  . nystatin  5 mL Oral QID  . pantoprazole  40 mg Oral Daily  . polyethylene glycol  17 g Oral BID  . propranolol  ER  60 mg Oral Daily  . senna  1 tablet Oral QHS  . sodium chloride flush  10-40 mL Intracatheter Q12H  . sodium chloride  1 g Oral TID   Continuous Infusions: . sodium chloride (hypertonic) 50 mL/hr at 12/27/17 1100    Principal Problem:   Acute hyponatremia Active Problems:   Acute respiratory failure with hypoxia (HCC)   Essential hypertension   Hypothyroidism   Chronic diastolic CHF (congestive heart failure) (HCC)   Severe pulmonary arterial systolic hypertension (HCC)   LOS: 8 days

## 2017-12-27 NOTE — Progress Notes (Signed)
CRITICAL VALUE ALERT  Critical Value: Na 117  Date & Time Notied: Now  Provider Notified: Dr Reginal Lutesanham  Orders Received/Actions taken: No new orders at this time

## 2017-12-28 ENCOUNTER — Inpatient Hospital Stay (HOSPITAL_COMMUNITY): Payer: Medicare Other

## 2017-12-28 LAB — BASIC METABOLIC PANEL
Anion gap: 7 (ref 5–15)
BUN: 8 mg/dL (ref 6–20)
CHLORIDE: 82 mmol/L — AB (ref 101–111)
CO2: 40 mmol/L — ABNORMAL HIGH (ref 22–32)
Calcium: 8.6 mg/dL — ABNORMAL LOW (ref 8.9–10.3)
Creatinine, Ser: 0.51 mg/dL (ref 0.44–1.00)
Glucose, Bld: 96 mg/dL (ref 65–99)
POTASSIUM: 3.8 mmol/L (ref 3.5–5.1)
SODIUM: 129 mmol/L — AB (ref 135–145)

## 2017-12-28 LAB — SODIUM, URINE, RANDOM
SODIUM UR: 68 mmol/L
SODIUM UR: 110 mmol/L

## 2017-12-28 LAB — OSMOLALITY, URINE: OSMOLALITY UR: 338 mosm/kg (ref 300–900)

## 2017-12-28 MED ORDER — BISACODYL 10 MG RE SUPP: 10.0000 mg | Freq: Every day | RECTAL | Status: DC | PRN

## 2017-12-28 MED ORDER — SODIUM CHLORIDE 1 G PO TABS
2.0000 g | ORAL_TABLET | Freq: Three times a day (TID) | ORAL | Status: DC
  Filled 2017-12-28 (×2): qty 2

## 2017-12-28 MED ORDER — CALCIUM CARBONATE ANTACID 500 MG PO CHEW
400.0000 mg | CHEWABLE_TABLET | Freq: Three times a day (TID) | ORAL | Status: DC
  Administered 2017-12-28 – 2017-12-29 (×3): 400 mg via ORAL
  Filled 2017-12-28 (×3): qty 2

## 2017-12-28 MED ORDER — SODIUM CHLORIDE 1 G PO TABS
1.0000 g | ORAL_TABLET | Freq: Three times a day (TID) | ORAL | Status: DC
  Administered 2017-12-28 – 2017-12-29 (×3): 1 g via ORAL
  Filled 2017-12-28 (×4): qty 1

## 2017-12-28 MED ORDER — BISACODYL 10 MG RE SUPP
10.0000 mg | Freq: Once | RECTAL | Status: AC
  Administered 2017-12-28: 10 mg via RECTAL
  Filled 2017-12-28: qty 1

## 2017-12-28 NOTE — Progress Notes (Signed)
Subjective: Interval History: Na 129 on hypertonic saline.  DC's this am.  Pt up in chair, feeling better.  O2sat dropped w oxygen off. Pt /wo complaint.s  Objective: Vital signs in last 24 hours: Temp:  [97.4 F (36.3 C)-98.7 F (37.1 C)] 97.4 F (36.3 C) (01/26 1854) Pulse Rate:  [64-73] 73 (01/26 1600) Resp:  [16-22] 20 (01/26 1600) BP: (111-141)/(32-46) 140/40 (01/26 1600) SpO2:  [97 %-99 %] 98 % (01/26 1600) Weight:  [85.6 kg (188 lb 11.4 oz)] 85.6 kg (188 lb 11.4 oz) (01/26 0500) Weight change: 2.5 kg (5 lb 8.2 oz)  Intake/Output from previous day: 01/25 0701 - 01/26 0700 In: 1104.2 [P.O.:600; I.V.:504.2] Out: 1400 [Urine:1400] Intake/Output this shift: No intake/output data recorded.  Gen: elderly AAF, no distress, frail Resp: clear bilat rales at bases Cardio: RRR 2/6 sem GI: obese, pos bs, soft, incision lower abdm Extremities: no leg edema  Lab Results: No results for input(s): WBC, HGB, HCT, PLT in the last 72 hours. BMET:  Recent Labs    12/27/17 0500  12/27/17 2200 12/28/17 0500  NA 119*   < > 126* 129*  K 4.8  --   --  3.8  CL 71*  --   --  82*  CO2 40*  --   --  40*  GLUCOSE 78  --   --  96  BUN 11  --   --  8  CREATININE 0.57  --   --  0.51  CALCIUM 8.5*  --   --  8.6*   < > = values in this interval not displayed.   No results for input(s): PTH in the last 72 hours. Iron Studies: No results for input(s): IRON, TIBC, TRANSFERRIN, FERRITIN in the last 72 hours.  Studies/Results: No results found.  I have reviewed the patient's current medications.  Assessment: 1 Hyponatremia - mixed picture, hypo then hypervolemic. Treating now as siadh, Na better after 3%.  Started demeclocycline which won't kick in right away.  Cont NaCl low dose for now and fluid restriction. Low SpO2 worrisome for CHF, will get CXR.   2 Obesity  3 HTN 5 Hypothyroid TSH ok 6 CAD 7 CVA  P - as above   LOS: 9 days   Maree KrabbeRobert D Kwanza Cancelliere 12/28/2017,8:09 PM

## 2017-12-28 NOTE — Progress Notes (Signed)
PROGRESS NOTE  Tammy BeachRosa L Colpitts ZOX:[id number] DOB: Mar 07, 1924 DOA: 12/19/2017 PCP: Elias Elseeade, Robert, MD  Assessment/Plan Hyponatremia, thought secondary to SIADH. - sodium level much improved today after 3% saline. Symptoms seem to be resolved - continue demeclocycline. Further recommendations per nephrology  Acute hypoxic resp failure. Probably really chronic. Previously on oxygen at home, but weaned off. - continue supplemental oxygen as needed, may need to go home with oxygen  Chronic diastolic CHF - appears compensated  Severe pulmonary hypertension - stable. Follow-up as an outpatient  Dysphagia. Possible thrush.  - continue diet per ST - continue Nystatin  Hypothyroidism. TSH WNL. - cont levothyroxine  Constipation - recurrent. Continue Miralax, Senna, add suppository. Enema if unrelieved.  Anxiety. Takes Xanax .25 BID scheduled at home; - appears stable. Continue Xanax.  DVT prophylaxis: enoxaparin Code Status: full Family Communication: family at bedside 1/26 Disposition Plan: home    Brendia Sacksaniel Rainbow Salman, MD  [facility] Direct contact: [phone] --Via amion app OR  --[email or web address]; password [email or web address]  7PM-7AM contact night coverage as above 12/28/2017, 9:52 AM  LOS: 9 days   Consultants:  Nephrology   Procedures:  Echo Study Conclusions  - Left ventricle: The cavity size was normal. There was moderate   focal basal hypertrophy. Systolic function was normal. The   estimated ejection fraction was in the range of 60% to 65%. Wall   motion was normal; there were no regional wall motion   abnormalities. Features are consistent with a pseudonormal left   ventricular filling pattern, with concomitant abnormal relaxation   and increased filling pressure (grade 2 diastolic dysfunction).   Doppler parameters are consistent with high ventricular filling   pressure. - Aortic valve: There was mild regurgitation. - Right ventricle: The cavity size was mildly  dilated. Wall   thickness was normal. Systolic function was moderately reduced. - Right atrium: The atrium was mildly dilated. - Tricuspid valve: There was moderate regurgitation. - Pulmonary arteries: PA peak pressure: 72 mm Hg (S). - Impressions: The right ventricular systolic pressure was   increased consistent with severe pulmonary hypertension.  Impressions:  - The right ventricular systolic pressure was increased consistent   with severe pulmonary hypertension.  Antimicrobials:    Interval history/Subjective: Feels better. No n/v. Reports GERD and constipation (chronic)  Objective: Vitals:  Vitals:   12/28/17 0400 12/28/17 0800  BP: (!) 137/46   Pulse: 68   Resp: 19   Temp:  98.5 F (36.9 C)  SpO2: 98%     Exam:  Constitutional:   . Appears calm and comfortable Eyes:  . pupils and irises appear normal . Normal lids ENMT:  . grossly normal hearing  . Lips appear normal Respiratory:  . CTA bilaterally, no w/r/r.  . Respiratory effort normal.  Cardiovascular:  . RRR, no m/r/g . No LE extremity edema   Psychiatric:  . Mental status o Mood, affect appropriate  I have personally reviewed the following:  Filed Weights   12/26/17 0500 12/27/17 0400 12/28/17 0500  Weight: 84.9 kg (187 lb 2.7 oz) 83.1 kg (183 lb 3.2 oz) 85.6 kg (188 lb 11.4 oz)   Weight change: 2.5 kg (5 lb 8.2 oz)  UOP: 1400 I/O since admission: -7.4 Last BM charted: 1/23 Foley: no Telemetry: SR Status: SDU  Labs:  Sodium 117 >> 119 >> 120 >> 126 >> 129  Chloride 70 >> 72 >> 71 >> >> 82  Imaging studies:     Scheduled Meds: . ALPRAZolam  0.25 mg Oral BID  .  amLODipine  10 mg Oral Daily  . aspirin EC  81 mg Oral Daily  . cetirizine  10 mg Oral Daily  . Chlorhexidine Gluconate Cloth  6 each Topical Daily  . demeclocycline  300 mg Oral Q12H  . enoxaparin (LOVENOX) injection  40 mg Subcutaneous QHS  . fluticasone  2 spray Each Nare Daily  . levothyroxine  50 mcg Oral  QAC breakfast  . nystatin  5 mL Oral QID  . pantoprazole  40 mg Oral Daily  . polyethylene glycol  17 g Oral BID  . propranolol ER  60 mg Oral Daily  . senna  1 tablet Oral QHS  . sodium chloride flush  10-40 mL Intracatheter Q12H  . sodium chloride  1 g Oral TID   Continuous Infusions:   Principal Problem:   Acute hyponatremia Active Problems:   Acute respiratory failure with hypoxia (HCC)   Essential hypertension   Hypothyroidism   Chronic diastolic CHF (congestive heart failure) (HCC)   Severe pulmonary arterial systolic hypertension (HCC)   LOS: 9 days

## 2017-12-29 DIAGNOSIS — G9340 Encephalopathy, unspecified: Secondary | ICD-10-CM

## 2017-12-29 LAB — BASIC METABOLIC PANEL
Anion gap: 8 (ref 5–15)
BUN: 10 mg/dL (ref 6–20)
CHLORIDE: 81 mmol/L — AB (ref 101–111)
CO2: 40 mmol/L — ABNORMAL HIGH (ref 22–32)
Calcium: 8.8 mg/dL — ABNORMAL LOW (ref 8.9–10.3)
Creatinine, Ser: 0.63 mg/dL (ref 0.44–1.00)
GFR calc Af Amer: >60 mL/min (ref >60)
GFR calc non Af Amer: >60 mL/min (ref >60)
Glucose, Bld: 77 mg/dL (ref 65–99)
POTASSIUM: 3.8 mmol/L (ref 3.5–5.1)
SODIUM: 129 mmol/L — AB (ref 135–145)

## 2017-12-29 MED ORDER — SODIUM CHLORIDE 1 G PO TABS: ORAL_TABLET | ORAL | 0 refills | Status: AC

## 2017-12-29 MED ORDER — DEMECLOCYCLINE HCL 300 MG PO TABS: 300.0000 mg | ORAL_TABLET | Freq: Two times a day (BID) | ORAL | 0 refills | Status: DC

## 2017-12-29 MED ORDER — NYSTATIN 100000 UNIT/ML MT SUSP: 5.0000 mL | Freq: Four times a day (QID) | OROMUCOSAL | 0 refills | Status: DC

## 2017-12-29 NOTE — Progress Notes (Signed)
Contacted [facility] for oxygen for home and HH arranged with [facility]. Isidoro DonningAlesia Evalisse Prajapati RN CCM Case Mgmt phone [phone]

## 2017-12-29 NOTE — Plan of Care (Signed)
  Clinical Measurements: Diagnostic test results will improve 12/29/2017 [id number] - Progressing by Herbert PunAddison, Camari Quintanilla Y, RN

## 2017-12-29 NOTE — Progress Notes (Signed)
SATURATION QUALIFICATIONS: (This note is used to comply with regulatory documentation for home oxygen)  Patient Saturations on Room Air at Rest = 87%  Patient Saturations on Room Air while Ambulating = 85%  Patient Saturations on1 Liters of oxygen while Ambulating = 95%  Please briefly explain why patient needs home oxygen: Pt unable to maintain sats on room air while at rest.

## 2017-12-29 NOTE — Discharge Summary (Signed)
Physician Discharge Summary  Tammy [name] ZHY:[id number] DOB: Mar 19, 1981 DOA: 12/19/2017  PCP: Elias Elseeade, Robert, MD  Admit date: 12/19/2017 Discharge date: 12/29/2017  Recommendations for Outpatient Follow-up:  1. Hyponatremia, see discussion below. 2. Acute hypoxic resp failure. Probably really chronic. See below. 3. Severe pulmonary hypertension   Follow-up Information    Elias Elseeade, Robert, MD. Schedule an appointment as soon as possible for a visit in 1 week(s).   Specialty:  Family Medicine Contact information: [phone] [place] [address] [phone]            Discharge Diagnoses:  1. Hyponatremia, with associated acute metabolic encephalopathy on admission; mixed picture, initially hypovolemic then hypervolemic; thought secondary to SIADH 2. Acute hypoxic resp failure 3. Chronic diastolic CHF 4. Severe pulmonary hypertension 5. Thrush 6. Hypothyroidism 7. Constipation 8. Anxiety 9. Aortic atherosclerosis   Discharge Condition: improved [facility]  Diet recommendation: regular  Filed Weights   12/27/17 0400 12/28/17 0500 12/29/17 0349  Weight: 83.1 kg (183 lb 3.2 oz) 85.6 kg (188 lb 11.4 oz) 84.5 kg (186 lb 4.6 oz)    History of present illness:  84yow sent to ED when outpt lab reviewed hyponatremia. Per son pt with constipation and taking Miralax with 2-3 days of diarrhea. Admitted for severe hyponatremia, acute encephalopathy, acute hypoxic respiratory failure.  Hospital Course:  Patient was treated for hyponatremia, which was difficult to correct and required involvement from nephrology; multiple trials in included 3% saline, Lasix, fluids, salt tablets. Hospitalization was prolonged by difficulty encountered in correcting sodium. Eventually sodium was corrected with 3% saline and stabilized on demeclocycline 300 BID and salt tabs.   Hyponatremia, with associated acute metabolic encephalopathy on admission; mixed  picture, initially hypovolemic then hypervolemic; thought secondary to SIADH. CT no acute abnormality. - Sodium stable on demeclocycline. - Discussed with Dr. Arlean HoppingSchertz today will d/c home on demeclocycline 300 BID and salt tab taper over 2 weeks.   Acute hypoxic resp failure. Probably really chronic. Previously on oxygen at home, but weaned off. Multifactorial: severe pulmonary HTN, diastolic CHF, chronic elevated right hemidiaphragm, chronic RLL collapse, bronchomalacia.  - continue [place] oxygen 1L on discharge  Chronic diastolic CHF - appears compensated on distable  Severe pulmonary hypertension - Follow-up as an outpatient  Thrush - continue diet per ST: regular, thin - continue Nystatin next 5 days  Hypothyroidism. TSH WNL. - cont levothyroxine  Constipation - continue home regimen  Anxiety. Takes Xanax .25 BID scheduled at home; - appears stable. Continue Xanax.  Vomiting, diarrhea, resolved  Aortic atherosclerosis   Consultants:  PCCM  Nephrology   Procedures:  PICC 1/18 >> 1/27  Echo Study Conclusions  - Left ventricle: The cavity size was normal. There was moderate focal basal hypertrophy. Systolic function was normal. The estimated ejection fraction was in the range of 60% to 65%. Wall motion was normal; there were no regional wall motion abnormalities. Features are consistent with a pseudonormal left ventricular filling pattern, with concomitant abnormal relaxation and increased filling pressure (grade 2 diastolic dysfunction). Doppler parameters are consistent with high ventricular filling pressure. - Aortic valve: There was mild regurgitation. - Right ventricle: The cavity size was mildly dilated. Wall thickness was normal. Systolic function was moderately reduced. - Right atrium: The atrium was mildly dilated. - Tricuspid valve: There was moderate regurgitation. - Pulmonary arteries: PA peak pressure: 72 mm Hg (S). -  Impressions: The right ventricular systolic pressure was increased consistent with severe pulmonary hypertension.  Impressions:  - The right ventricular systolic pressure was increased consistent with severe pulmonary hypertension.  Today's assessment: S: feels good today. Tolerating diet. Stood up yesterday and was up to chair yesterday. O: Vitals:  Vitals:   12/29/17 0937 12/29/17 0942  BP:    Pulse:    Resp:    Temp:    SpO2: (!) 87% 95%    Constitutional:  . Appears calm and comfortable ENMT:  . grossly normal hearing  . Lips appear normal . Mouth: no exudate noted Respiratory:  . CTA bilaterally, no w/r/r.  . Respiratory effort normal.  Cardiovascular:  . RRR, no m/r/g . No LE extremity edema   Psychiatric:  . Mental status o Mood, affect appropriate  Sodium 129, stable  Discharge Instructions  Discharge Instructions    Diet general   Complete by:  As directed    Discharge instructions   Complete by:  As directed    Call your physician or seek immediate medical attention for confusion, falls, pain, vomiting, headache or worsening of condition.   Increase activity slowly   Complete by:  As directed      Allergies as of 12/29/2017      Reactions   Apple Flavor Hives   No apple sauce   Crestor [rosuvastatin Calcium] Other (See Comments)   Muscle Aches   Keflex [cephalexin] Other (See Comments)   dizziness   Peanut Butter Flavor Hives   Sulfa Antibiotics Hives   Cephalexin Other (See Comments)   dizziness   Crestor [rosuvastatin Calcium]    Sulfa Drugs Cross Reactors Rash      Medication List    STOP taking these medications   QUEtiapine 25 MG tablet Commonly known as:  SEROQUEL     TAKE these medications   albuterol (2.5 MG/3ML) 0.083% nebulizer solution Commonly known as:  PROVENTIL Take 3 mLs (2.5 mg total) by nebulization every 6 (six) hours as needed for wheezing or shortness of breath.   ALPRAZolam 0.5 MG tablet Commonly  known as:  XANAX Take 0.5 tablets (0.25 mg total) by mouth 2 (two) times daily. What changed:    when to take this  reasons to take this   amLODipine 10 MG tablet Commonly known as:  NORVASC Take 1 tablet (10 mg total) by mouth daily.   Aspirin 81 MG EC tablet Take 81 mg by mouth daily.   calcium carbonate 500 MG chewable tablet Commonly known as:  TUMS - dosed in mg elemental calcium Chew 1 tablet by mouth daily as needed for indigestion or heartburn.   chlorpheniramine 4 MG tablet Commonly known as:  CHLOR-TRIMETON Take 4 mg by mouth 2 (two) times daily as needed for allergies. Reported on 01/02/2016   demeclocycline 300 MG tablet Commonly known as:  DECLOMYCIN Take 1 tablet (300 mg total) by mouth 2 (two) times daily. What changed:  how much to take   diphenhydrAMINE 25 MG tablet Commonly known as:  BENADRYL Take 25 mg by mouth every 6 (six) hours as needed for itching or allergies.   fluticasone 50 MCG/ACT nasal spray Commonly known as:  FLONASE Place 2 sprays into both nostrils daily.   levothyroxine 50 MCG tablet Commonly known as:  SYNTHROID, LEVOTHROID Take 50 mcg by mouth daily before breakfast. What changed:  Another medication with the same name was removed. Continue taking this medication, and follow the directions you see here.   lisinopril 10 MG tablet Commonly known as:  PRINIVIL,ZESTRIL Take 1 tablet (10 mg total)  by mouth daily. What changed:  how much to take   loratadine 10 MG tablet Commonly known as:  CLARITIN Take 10 mg by mouth daily.   meclizine 12.5 MG tablet Commonly known as:  ANTIVERT Take 12.5 mg by mouth every 6 (six) hours as needed for dizziness.   nystatin 100000 UNIT/ML suspension Commonly known as:  MYCOSTATIN Take 5 mLs (500,000 Units total) by mouth 4 (four) times daily. Last day 2/1.   olopatadine 0.1 % ophthalmic solution Commonly known as:  PATANOL Place 1 drop into both eyes daily as needed for allergies. What  changed:  Another medication with the same name was removed. Continue taking this medication, and follow the directions you see here.   pantoprazole 40 MG tablet Commonly known as:  PROTONIX Take 40 mg by mouth daily.   pravastatin 40 MG tablet Commonly known as:  PRAVACHOL Take 1 tablet (40 mg total) by mouth daily at 6 PM.   propranolol ER 60 MG 24 hr capsule Commonly known as:  INDERAL LA Take 1 capsule (60 mg total) by mouth daily.   sodium chloride 1 g tablet Take 1 tablet (1 g total) by mouth 2 (two) times daily with a meal for 7 days, THEN 1 tablet (1 g total) daily for 7 days. Start taking on:  12/29/2017   tamsulosin 0.4 MG Caps capsule Commonly known as:  FLOMAX Take 1 capsule (0.4 mg total) by mouth daily.   VITAMIN C PO Take 1 tablet by mouth daily.            Durable Medical Equipment  (From admission, onward)        Start     Ordered   12/29/17 1151  For home use only DME oxygen  Once    Question Answer Comment  Mode or (Route) Nasal cannula   Liters per Minute 1   Frequency Continuous (stationary and portable oxygen unit needed)   Oxygen delivery system Gas      12/29/17 1150     Allergies  Allergen Reactions  . Apple Flavor Hives    No apple sauce  . Crestor [Rosuvastatin Calcium] Other (See Comments)    Muscle Aches  . Keflex [Cephalexin] Other (See Comments)    dizziness  . Peanut Butter Flavor Hives  . Sulfa Antibiotics Hives  . Cephalexin Other (See Comments)    dizziness  . Crestor [Rosuvastatin Calcium]   . Sulfa Drugs Cross Reactors Rash    The results of significant diagnostics from this hospitalization (including imaging, microbiology, ancillary and laboratory) are listed below for reference.    Significant Diagnostic Studies: Dg Chest 2 View  Result Date: 12/19/2017 CLINICAL DATA:  Dyspnea EXAM: CHEST  2 VIEW COMPARISON:  12/19/2017 chest radiograph. FINDINGS: Stable cardiomediastinal silhouette with mild cardiomegaly,  prominent main pulmonary artery contour and aortic atherosclerosis. No pneumothorax. Stable prominent eventration of the anterior right hemidiaphragm. No convincing pleural effusion. Stable moderate right basilar atelectasis. Borderline mild pulmonary edema, slightly worsened. IMPRESSION: 1. Borderline mild congestive heart failure, slightly worsened. 2. Stable prominent eventration of the anterior right hemidiaphragm with associated moderate right basilar atelectasis. Electronically Signed   By: Delbert Phenix M.D.   On: 12/19/2017 19:03   Dg Chest 2 View  Result Date: 12/19/2017 CLINICAL DATA:  Cough, congestion, sob EXAM: CHEST  2 VIEW COMPARISON:  12/13/2015 FINDINGS: Shallow lung inflation. Stable elevation of the right hemidiaphragm. The heart is mildly enlarged. There is atherosclerotic calcification of the thoracic aorta. Mild pulmonary vascular  congestion without overt edema. No focal consolidations. IMPRESSION: Stable cardiomegaly.  Mild vascular congestion without overt edema. Electronically Signed   By: Norva Pavlov M.D.   On: 12/19/2017 17:51   Ct Head Wo Contrast  Result Date: 12/20/2017 CLINICAL DATA:  84 year old female with altered level of consciousness and weakness. EXAM: CT HEAD WITHOUT CONTRAST TECHNIQUE: Contiguous axial images were obtained from the base of the skull through the vertex without intravenous contrast. COMPARISON:  12/05/2015 MR and prior studies FINDINGS: Brain: No evidence of acute infarction, hemorrhage, hydrocephalus, extra-axial collection or mass lesion/mass effect. Mild atrophy and chronic small-vessel white matter ischemic changes are again noted. Vascular: Mild atherosclerotic calcifications Skull: Normal. Negative for fracture or focal lesion. Sinuses/Orbits: No acute finding. Other: None. IMPRESSION: 1. No evidence of acute intracranial abnormality 2. Mild atrophy and chronic small-vessel white matter ischemic changes. Electronically Signed   By: Harmon Pier  M.D.   On: 12/20/2017 16:42   Ct Angio Chest Pe W Or Wo Contrast  Result Date: 12/21/2017 CLINICAL DATA:  Shortness of breath EXAM: CT ANGIOGRAPHY CHEST WITH CONTRAST TECHNIQUE: Multidetector CT imaging of the chest was performed using the standard protocol during bolus administration of intravenous contrast. Multiplanar CT image reconstructions and MIPs were obtained to evaluate the vascular anatomy. CONTRAST:  ISOVUE-370 IOPAMIDOL (ISOVUE-370) INJECTION 76% COMPARISON:  12/16/2006 FINDINGS: Cardiovascular: Good opacification of the pulmonary arteries. There is some mixing artifact and diminished opacification of right lower lobe branches that is likely due to chronic collapse with poor blood flow. No evidence of acute pulmonary embolism. Borderline cardiomegaly with size distorted by leftward shift of the mediastinum. Enlarged pulmonary artery measuring up to 3.2 cm, as with pulmonary hypertension. Atherosclerotic calcification of the aorta diffusely. Mediastinum/Nodes: Negative for adenopathy or mass. Right upper extremity PICC with tip at the SVC level. Lungs/Pleura: Degree of bronchomalacia with central airway flattening. There is chronic collapse of the right lower lobe. Associated trace right pleural fluid. Few calcified granulomas. There is no edema, consolidation, effusion, or pneumothorax. Upper Abdomen: No acute finding. Musculoskeletal: No acute finding.  Generalized spondylosis. Review of the MIP images confirms the above findings. IMPRESSION: 1. Negative for pulmonary embolism or other acute finding. 2. Chronic elevation of the right diaphragm with right lower lobe collapse. 3. Bronchomalacia. 4.  Aortic Atherosclerosis (ICD10-I70.0). Electronically Signed   By: Marnee Spring M.D.   On: 12/21/2017 10:58   Dg Chest Port 1 View  Result Date: 12/28/2017 CLINICAL DATA:  Dyspnea on minimal exertion. History of pneumonia, hypertension, NSTEMI, nonsmoker EXAM: PORTABLE CHEST 1 VIEW COMPARISON:   12/24/2017 FINDINGS: Shallow inspiration and patient rotation limits examination. Elevation of the right hemidiaphragm. Cardiac enlargement without vascular congestion or edema. No blunting of costophrenic angles. No pneumothorax. Right PICC catheter with tip over the low SVC region. Calcification of the aorta. Degenerative changes in the spine and shoulders. Probable loose bodies in the right subacromial space. IMPRESSION: Shallow inspiration with elevation of the right hemidiaphragm. Right PICC line appears in satisfactory position. No evidence of active pulmonary disease given technique. Aortic atherosclerosis. Similar appearance to previous study. Electronically Signed   By: Burman Nieves M.D.   On: 12/28/2017 21:47   Dg Chest Port 1 View  Result Date: 12/24/2017 CLINICAL DATA:  Hypoxia. EXAM: PORTABLE CHEST 1 VIEW COMPARISON:  CT 12/21/2016.  Chest x-ray 12/21/2016. FINDINGS: Right PICC line noted with tip over superior vena cava. Heart size normal. Right lower lobe atelectasis with elevation right hemidiaphragm. No change from prior exam. No  acute infiltrate. No pleural effusion or pneumothorax. IMPRESSION: 1.  Right PICC line noted with tip over superior vena cava. 2. Unchanged right lower lobe atelectasis with elevation right hemidiaphragm. Similar findings noted on prior exams. Electronically Signed   By: Maisie Fus  Register   On: 12/24/2017 13:47   Dg Chest Port 1 View  Result Date: 12/21/2017 CLINICAL DATA:  84 year old female with confusion, weakness, recent cough and shortness of breath. EXAM: PORTABLE CHEST 1 VIEW COMPARISON:  12/20/2017 and earlier. FINDINGS: Portable AP semi upright view at 0733 hours. Right PICC line placed. Chronic moderate to severe elevation of the right hemidiaphragm is only mildly progressed compared to 2,007. Associated right lung base atelectasis. No pneumothorax or pulmonary edema. No pleural effusions suspected. No other confluent pulmonary opacity. Stable cardiac  size and mediastinal contours. Calcified aortic atherosclerosis. Visualized tracheal air column is within normal limits. Paucity bowel gas in the upper abdomen. IMPRESSION: 1. Right PICC line placed. 2.  No acute cardiopulmonary abnormality. 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 Electronically Signed   By: Odessa Fleming M.D.   On: 12/21/2017 07:51   Dg Chest Port 1 View  Result Date: 12/20/2017 CLINICAL DATA:  Hypoxia. EXAM: PORTABLE CHEST 1 VIEW COMPARISON:  Radiographs of December 19, 2017. FINDINGS: Stable cardiomegaly. Atherosclerosis of thoracic aorta is noted. No pneumothorax is noted. Stable elevated right hemidiaphragm is noted. Stable mild bibasilar subsegmental atelectasis is noted. No significant pleural effusion is noted. Bony thorax is unremarkable. IMPRESSION: Mild bibasilar subsegmental atelectasis. Aortic atherosclerosis. Stable elevated right hemidiaphragm. Electronically Signed   By: Lupita Raider, M.D.   On: 12/20/2017 08:47    Microbiology: Recent Results (from the past 240 hour(s))  MRSA PCR Screening     Status: None   Collection Time: 12/24/17  3:50 PM  Result Value Ref Range Status   MRSA by PCR NEGATIVE NEGATIVE Final    Comment:        The GeneXpert MRSA Assay (FDA approved for NASAL specimens only), is one component of a comprehensive MRSA colonization surveillance program. It is not intended to diagnose MRSA infection nor to guide or monitor treatment for MRSA infections.      Labs: Basic Metabolic Panel: Recent Labs  Lab 12/23/17 0403  12/24/17 0326  12/25/17 9604  12/26/17 0459  12/27/17 0500 12/27/17 1200 12/27/17 1600 12/27/17 2200 12/28/17 0500 12/29/17 0325  NA 128*   < > 122*   < > 122*   < > 122*   < > 119* 120* 123* 126* 129* 129*  K 3.3*  --  2.9*  --  4.1  --  4.4  --  4.8  --   --   --  3.8 3.8  CL 78*  --  65*  --  70*  --  72*  --  71*  --   --   --  82* 81*    CO2 42*  --  44*  --  45*  --  42*  --  40*  --   --   --  40* 40*  GLUCOSE 129*  --  138*  --  118*  --  102*  --  78  --   --   --  96 77  BUN 6  --  <5*  --  5*  --  9  --  11  --   --   --  8 10  CREATININE 0.54  --  0.47  --  0.60  --  0.57  --  0.57  --   --   --  0.51 0.63  CALCIUM 8.8*  --  8.8*  --  8.7*  --  8.8*  --  8.5*  --   --   --  8.6* 8.8*  MG 1.4*  --  1.4*  --   --   --   --   --   --   --   --   --   --   --   PHOS 1.7*  --  1.7*  --   --   --   --   --   --   --   --   --   --   --    < > = values in this interval not displayed.   Liver Function Tests: Recent Labs  Lab 12/23/17 0403  ALBUMIN 3.1*   CBC: Recent Labs  Lab 12/23/17 0403 12/24/17 0326 12/25/17 0611  WBC 9.1 9.9 11.4*  HGB 13.0 14.0 12.3  HCT 39.7 41.2 36.6  MCV 92.3 90.0 89.3  PLT 262 247 234    Recent Labs    12/20/17 0029  BNP 1,214.7*     Principal Problem:   Acute hyponatremia Active Problems:   Acute respiratory failure with hypoxia (HCC)   Essential hypertension   Hypothyroidism   Chronic diastolic CHF (congestive heart failure) (HCC)   Severe pulmonary arterial systolic hypertension (HCC)   Time coordinating discharge: 35 minutes  Signed:  Brendia Sacks, MD [facility] 12/29/2017, 12:38 PM

## 2018-01-04 IMAGING — CR DG CHEST 1V PORT
1 series · 1 of 1 positions shown · non-contrast
Comparison: 12/04/2015

CLINICAL DATA: Shortness of breath, pneumonia

EXAM:
PORTABLE CHEST 1 VIEW

[AP]
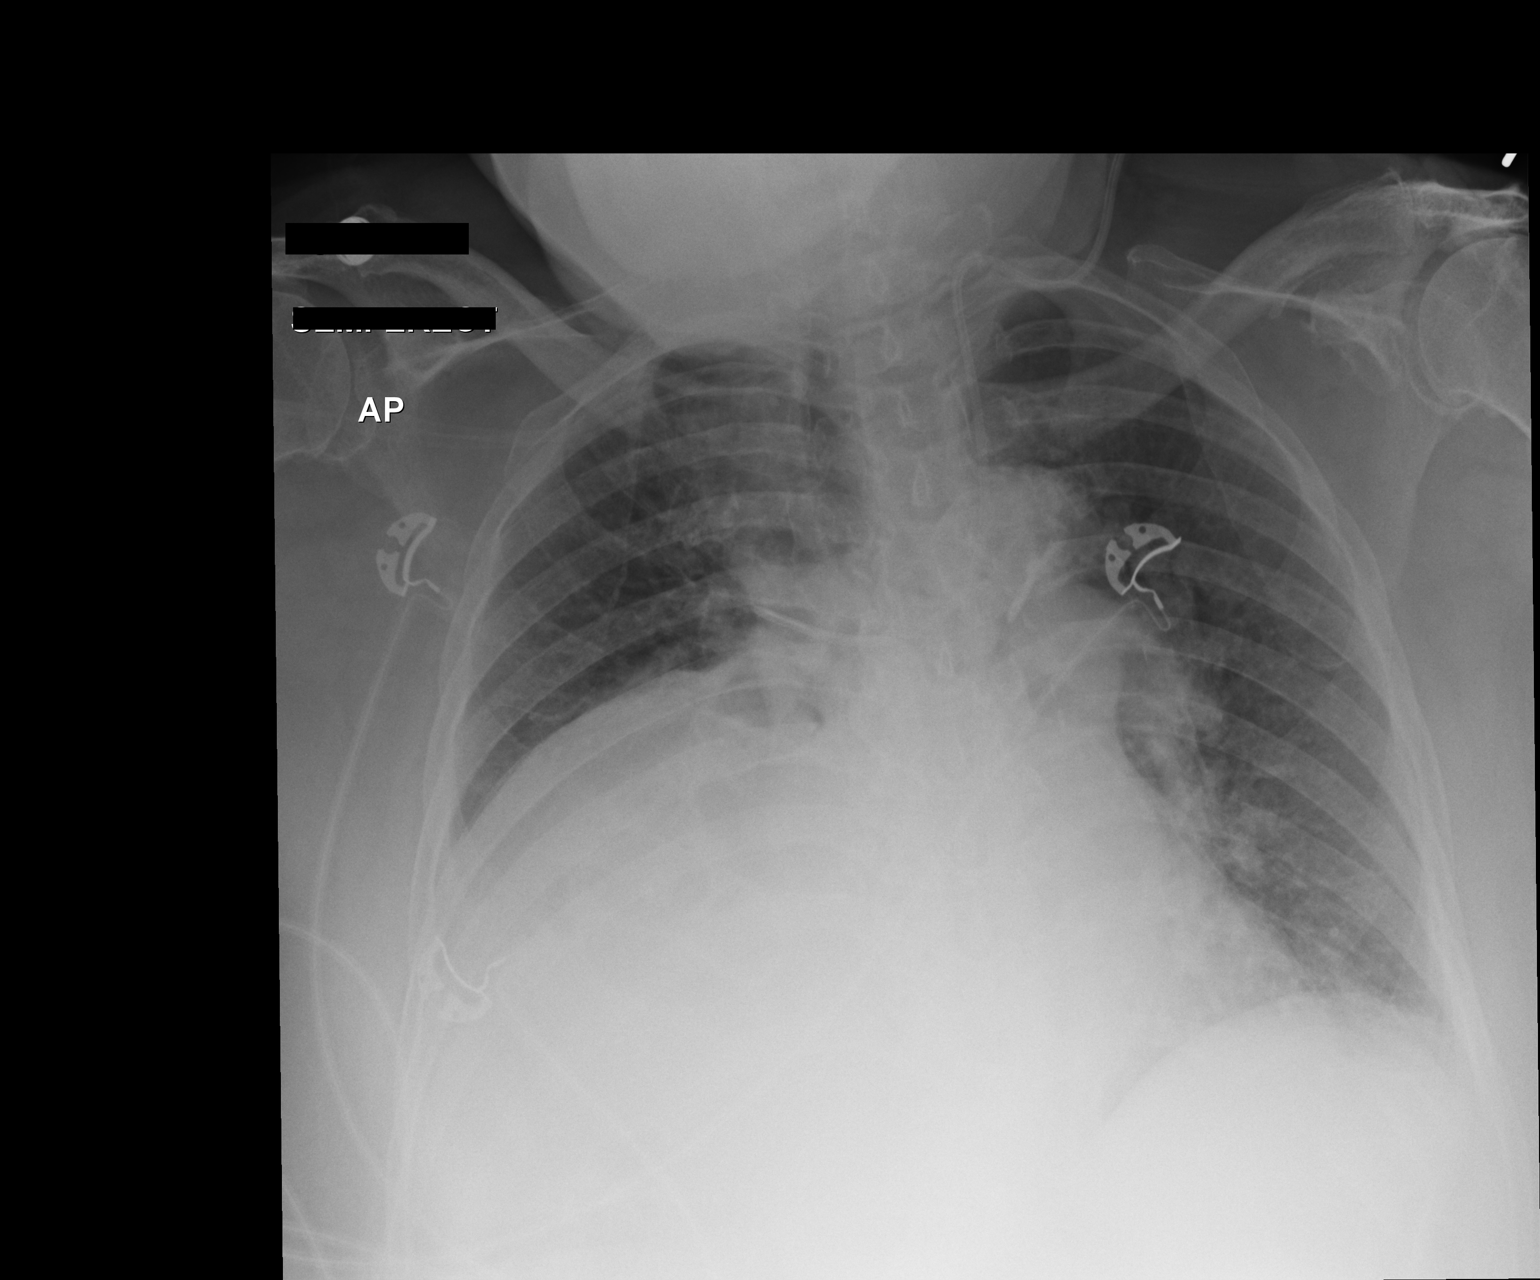

[1 of 1 positions shown; findings below may reference images not displayed]

FINDINGS: Cardiomegaly again noted. Left IJ central line with tip in SVC on
the level of brachiocephalic vein is unchanged in position. No
pneumothorax. Again noted chronic elevation of the right
hemidiaphragm. Stable chronic atelectasis or infiltrate right base.
Mild degenerative changes thoracic spine. Left lung is clear. No
pulmonary edema.
IMPRESSION: Stable left IJ central line position. No pneumothorax. Chronic
elevation of the right hemidiaphragm. Stable chronic atelectasis or
infiltrate right base. No pulmonary edema

## 2018-01-07 IMAGING — CR DG ABD PORTABLE 1V
1 series · 1 of 1 positions shown · non-contrast
Comparison: 01/14/2014

CLINICAL DATA: Nausea.  Fatty liver disease.

EXAM:
PORTABLE ABDOMEN - 1 VIEW

[AP]
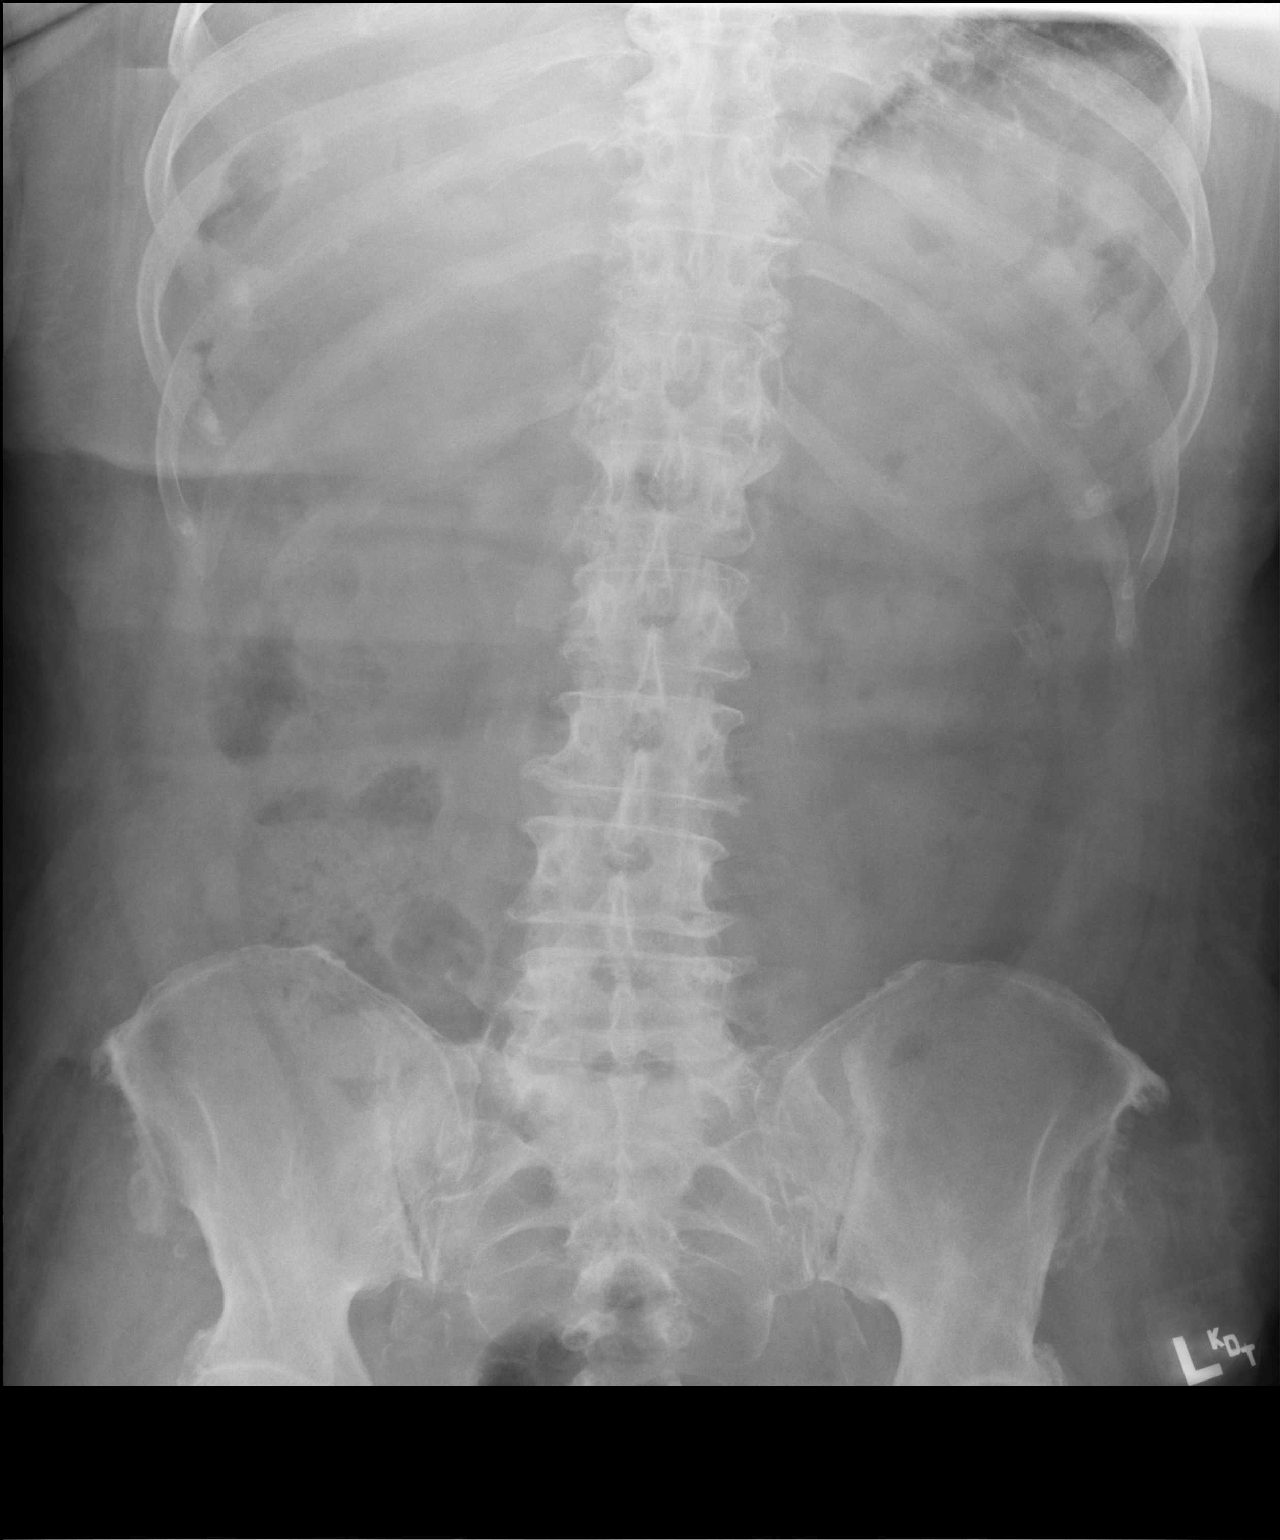

[1 of 1 positions shown; findings below may reference images not displayed]

FINDINGS: The bowel gas pattern is normal. No radio-opaque calculi or other
significant radiographic abnormality are seen.
IMPRESSION: Negative.

## 2018-01-09 IMAGING — CR DG CHEST 1V PORT
1 series · 1 of 1 positions shown · non-contrast
Comparison: Radiograph dated 12/12/2015

CLINICAL DATA: 89-year-old female with shortness of breath

EXAM:
PORTABLE CHEST 1 VIEW

[portable]
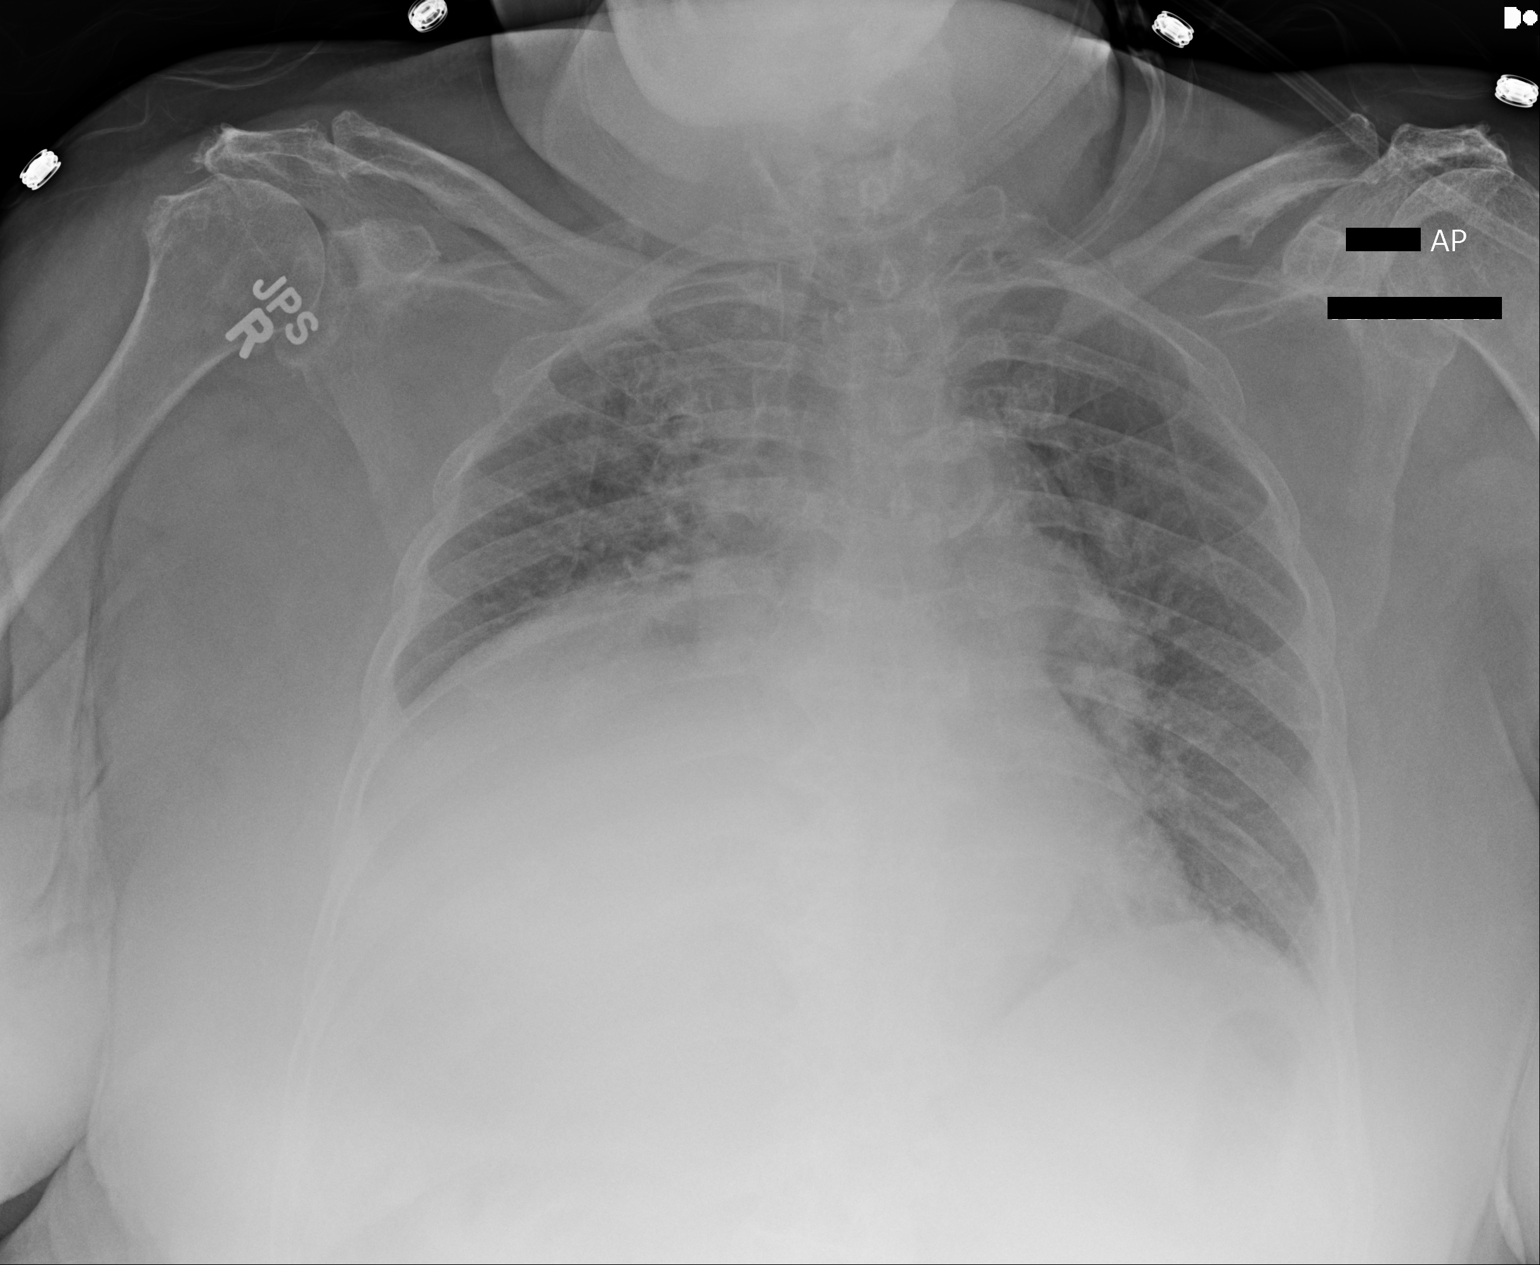

[1 of 1 positions shown; findings below may reference images not displayed]

FINDINGS: There is stable appearance of the elevation of the right
hemidiaphragm. A focal consolidation or subpulmonic effusion in the
right lower lobe is not excluded. The left lung is clear. No
pneumothorax. There is silhouetting of the right cardiac border. The
osseous structures appear unremarkable.
IMPRESSION: Stable elevated appearance of the right hemidiaphragm. No interval
change.

## 2018-03-25 ENCOUNTER — Encounter: Payer: Self-pay | Admitting: Family Medicine

## 2018-03-25 ENCOUNTER — Other Ambulatory Visit: Payer: Self-pay

## 2018-03-25 ENCOUNTER — Ambulatory Visit (INDEPENDENT_AMBULATORY_CARE_PROVIDER_SITE_OTHER): Payer: Medicare Other | Admitting: Family Medicine

## 2018-03-25 VITALS — BP 128/80 | HR 73 | Temp 98.1°F | Ht 62.0 in | Wt 172.0 lb

## 2018-03-25 DIAGNOSIS — K2100 Gastro-esophageal reflux disease with esophagitis, without bleeding: Secondary | ICD-10-CM

## 2018-03-25 DIAGNOSIS — K5900 Constipation, unspecified: Secondary | ICD-10-CM

## 2018-03-25 DIAGNOSIS — E871 Hypo-osmolality and hyponatremia: Secondary | ICD-10-CM | POA: Diagnosis not present

## 2018-03-25 DIAGNOSIS — I10 Essential (primary) hypertension: Secondary | ICD-10-CM | POA: Diagnosis not present

## 2018-03-25 DIAGNOSIS — E039 Hypothyroidism, unspecified: Secondary | ICD-10-CM | POA: Diagnosis not present

## 2018-03-25 DIAGNOSIS — I5032 Chronic diastolic (congestive) heart failure: Secondary | ICD-10-CM

## 2018-03-25 DIAGNOSIS — I2721 Secondary pulmonary arterial hypertension: Secondary | ICD-10-CM | POA: Diagnosis not present

## 2018-03-25 DIAGNOSIS — E785 Hyperlipidemia, unspecified: Secondary | ICD-10-CM

## 2018-03-25 MED ORDER — PANTOPRAZOLE SODIUM 40 MG PO TBEC: 40.0000 mg | DELAYED_RELEASE_TABLET | Freq: Every day | ORAL | 5 refills | Status: DC

## 2018-03-25 MED ORDER — LISINOPRIL 5 MG PO TABS: 5.0000 mg | ORAL_TABLET | Freq: Every day | ORAL | 5 refills | Status: DC

## 2018-03-25 MED ORDER — PROPRANOLOL HCL ER 60 MG PO CP24: 60.0000 mg | ORAL_CAPSULE | Freq: Every day | ORAL | 5 refills | Status: DC

## 2018-03-25 MED ORDER — ALPRAZOLAM 0.5 MG PO TABS: 0.2500 mg | ORAL_TABLET | Freq: Two times a day (BID) | ORAL | 0 refills | Status: DC | PRN

## 2018-03-25 MED ORDER — OLOPATADINE HCL 0.1 % OP SOLN: 1.0000 [drp] | Freq: Every day | OPHTHALMIC | 5 refills | Status: DC | PRN

## 2018-03-25 MED ORDER — FLUTICASONE PROPIONATE 50 MCG/ACT NA SUSP: 1.0000 | Freq: Two times a day (BID) | NASAL | 5 refills | Status: DC

## 2018-03-25 MED ORDER — DEMECLOCYCLINE HCL 300 MG PO TABS: 150.0000 mg | ORAL_TABLET | Freq: Two times a day (BID) | ORAL | 0 refills | Status: DC

## 2018-03-25 MED ORDER — CETIRIZINE HCL 10 MG PO TABS: 5.0000 mg | ORAL_TABLET | Freq: Every day | ORAL | 5 refills | Status: DC

## 2018-03-25 MED ORDER — MECLIZINE HCL 12.5 MG PO TABS: 12.5000 mg | ORAL_TABLET | Freq: Two times a day (BID) | ORAL | 0 refills | Status: DC | PRN

## 2018-03-25 MED ORDER — LEVOTHYROXINE SODIUM 50 MCG PO TABS
50.0000 ug | ORAL_TABLET | Freq: Every day | ORAL | 5 refills | Status: DC
Start: 2018-03-25 — End: 2018-09-16

## 2018-03-25 NOTE — Progress Notes (Signed)
4/23/20192:41 PM  Tammy Fernandez 1924/05/17, 84 y.o. female [id number]  Chief Complaint  Patient presents with  . Establish Care    HPI:   Patient is a 84 y.o. female with past medical history significant for as below who presents today to establish care  Most recent hospitalization in Jan 2019, again with metabolic encephalopathy 2/2 hyponatremia, nadir 104. SIADH, discharged on [facility], those services since have stopped D/C sodium was 129 She has not seen renal, still taking salt tabs and demeclocycline 14m BID  She has also stopped using laxatives which with used inappropriatey including enemas for chronic constipation. She currenty uses glycerin suppositories, probiotics and fiber  She is overall doing well Has no sequela from her CVA She does use a cane or walker for walking and will use a wheelchair for longer distance, she overall feels weak and unsteady She has an EMS alert system She denies incontinence of bladder or bowel She reports able to bathe herself, uses shower chair She has been using her oxygen 24/7 via [place], 1L, for severe pHTN per echo, has never seen pulm She has been weighing herself daily and following low salt diet She has been having nocturnal gerd sx despite elevation of HOB, bloating and gassiness, has been off protonix for over a month Anxiety stable, uses xanax daily, has been on this rx for years. no SSRI given hyponatremia.  She lives with her son JAwanda Mink here with her, her daughter and granddaughter She has no acute concerns today   [facility] 03/25/2018 03/12/2014  Decreased Interest (No Data) 0  Down, Depressed, Hopeless - 0  PHQ - 2 Score - 0    Allergies  Allergen Reactions  . Apple Flavor Hives    No apple sauce  . Crestor [Rosuvastatin Calcium] Other (See Comments)    Muscle Aches  . Keflex [Cephalexin] Other (See Comments)    dizziness  . Peanut Butter Flavor Hives  . Sulfa Antibiotics Hives  . Cephalexin Other (See  Comments)    dizziness  . Crestor [Rosuvastatin Calcium]   . Sulfa Drugs Cross Reactors Rash    Prior to Admission medications   Medication Sig Start Date End Date Taking? Authorizing Provider  albuterol (PROVENTIL) (2.5 MG/3ML) 0.083% nebulizer solution Take 3 mLs (2.5 mg total) by nebulization every 6 (six) hours as needed for wheezing or shortness of breath. 11/01/15  Yes Tat, DShanon Brow MD  ALPRAZolam (Duanne Moron 0.5 MG tablet Take 0.5 tablets (0.25 mg total) by mouth 2 (two) times daily. Patient taking differently: Take 0.25 mg by mouth 2 (two) times daily as needed for anxiety.  12/15/15  Yes SThurnell Lose MD  Ascorbic Acid (VITAMIN C PO) Take 1 tablet by mouth daily.   Yes [provider]  Aspirin 81 MG EC tablet Take 81 mg by mouth daily.   Yes [provider]  calcium carbonate (TUMS - DOSED IN MG ELEMENTAL CALCIUM) 500 MG chewable tablet Chew 1 tablet by mouth daily as needed for indigestion or heartburn.   Yes [provider]  demeclocycline (DECLOMYCIN) 300 MG tablet Take 1/2 tablet (150 mg total) by mouth 2 (two) times daily. 12/29/17  Yes GSamuella Cota MD  diphenhydrAMINE (BENADRYL) 25 MG tablet Take 25 mg by mouth every 6 (six) hours as needed for itching or allergies.   Yes [provider]  fluticasone (FLONASE) 50 MCG/ACT nasal spray Place 2 sprays into both nostrils daily.  08/16/16  Yes [provider]  levothyroxine (  SYNTHROID, LEVOTHROID) 50 MCG tablet Take 50 mcg by mouth daily before breakfast. 06/21/16  Yes [provider]  lisinopril (PRINIVIL,ZESTRIL) 10 MG tablet Take 1 tablet (10 mg total) by mouth daily. Patient taking differently: Take 5 mg by mouth daily.  08/30/16  Yes Tat, Shanon Brow, MD  loratadine (CLARITIN) 10 MG tablet Take 10 mg by mouth daily.   Yes [provider]  meclizine (ANTIVERT) 12.5 MG tablet Take 12.5 mg by mouth every 6 (six) hours as needed for dizziness. 08/16/16  Yes [provider]  olopatadine (PATANOL) 0.1 % ophthalmic solution Place 1 drop into both eyes daily as needed for allergies.  07/31/16  Yes [provider]  pantoprazole (PROTONIX) 40 MG tablet Take 40 mg by mouth daily. 11/10/15  Yes [provider]  propranolol ER (INDERAL LA) 60 MG 24 hr capsule Take 1 capsule (60 mg total) by mouth daily. 01/29/14  Yes Angiulli, Lavon Paganini, PA-C  Fish oil, red rice yeast and hawthorne berry daily supplements OTC salt tablets, 1 tab BID zofran 76m ODT q day prn   Past Medical History:  Diagnosis Date  . Anxiety   . Chronic lower back pain   . Constipation   . [place]) ([facility] [place]   neg workup, MRI small acute lacunar infarcts. ASA only  . Depression   . Diastolic CHF, chronic (HCC)    grade I, most recent echo Jan 2019  . Fatty liver   . GERD (gastroesophageal reflux disease)   . Hyperlipidemia   . Hypertension   . Hyponatremia    multiple episodes with encephalopathy, renal thinks SIADH  . Hypothyroidism, unspecified   . Internal hemorrhoid   . NSTEMI (non-ST elevated myocardial infarction) ([facility] 08/28/2016  . Palpitations   . PFO with atrial septal aneurysm    TTE 2015  . Pneumonia 2016  . Severe pulmonary arterial systolic hypertension ([place] 12/25/2017   per echo, on 1L oxgen via [place]    Past Surgical History:  Procedure Laterality Date  . ABDOMINAL HYSTERECTOMY    . CATARACT EXTRACTION W/ INTRAOCULAR LENS  IMPLANT, BILATERAL Bilateral   . CESAREAN SECTION    . TEE WITHOUT CARDIOVERSION N/A 01/19/2014   Procedure: TRANSESOPHAGEAL ECHOCARDIOGRAM (TEE);  Surgeon: DLarey Dresser MD;  [facility]  Service: Cardiovascular;  Laterality: N/A;  dayna /Greggory Brandy . VAGINAL HYSTERECTOMY      Social History   Tobacco Use  . Smoking status: Never Smoker  . Smokeless tobacco: Former USystems developer   Types: Snuff  . Tobacco comment: "used snuff when I was 19"  Substance Use Topics  . Alcohol use: No    Family History    Problem Relation Age of Onset  . Stroke Father   . Hypertension Father   . Colon cancer Cousin   . Colon cancer Unknown        Aunt  . Diabetes Unknown        aunt and cousins  . Heart disease Unknown        cousins  . Healthy Daughter   . Healthy Son     Review of Systems  Constitutional: Negative for chills and fever.  HENT: Positive for congestion. Negative for ear pain, sinus pain and sore throat.   Respiratory: Negative for cough and shortness of breath.   Cardiovascular: Negative for chest pain, palpitations and leg swelling.  Gastrointestinal: Positive for constipation and heartburn. Negative for abdominal pain, blood in stool, diarrhea, melena, nausea and  vomiting.  Genitourinary: Negative for dysuria, frequency, hematuria and urgency.  Musculoskeletal: Negative for falls.  Neurological: Positive for dizziness and weakness. Negative for sensory change, speech change and focal weakness.  Psychiatric/Behavioral: Negative for depression. The patient is nervous/anxious. The patient does not have insomnia.      OBJECTIVE:  Blood pressure 128/80, pulse 73, temperature 98.1 F (36.7 C), temperature source Oral, height 5' 2"  (1.575 m), weight 172 lb (78 kg), SpO2 95 %.  Wt Readings from Last 3 Encounters:  03/25/18 172 lb (78 kg)  12/29/17 186 lb 4.6 oz (84.5 kg)  08/29/16 179 lb (81.2 kg)    Physical Exam  Constitutional: She is oriented to person, place, and time. She appears well-developed and well-nourished.  HENT:  Head: Normocephalic and atraumatic.  Mouth/Throat: Oropharynx is clear and moist. No oropharyngeal exudate.  Eyes: Pupils are equal, round, and reactive to light. Conjunctivae and EOM are normal. No scleral icterus.  Neck: Neck supple.  Cardiovascular: Normal rate, regular rhythm and normal heart sounds. Exam reveals no gallop and no friction rub.  No murmur heard. Pulmonary/Chest: Effort normal and breath sounds normal. She has no wheezes. She has no  rhonchi. She has no rales.  Wearing her oxygen  Abdominal: Soft. Bowel sounds are normal. She exhibits no distension and no mass. There is no tenderness.  Musculoskeletal: She exhibits no edema.  Neurological: She is alert and oriented to person, place, and time.  In wheelchair  Skin: Skin is warm and dry.  Psychiatric: She has a normal mood and affect.  Nursing note and vitals reviewed.    ASSESSMENT and PLAN  1. Hyponatremia Sodium level not checked since Jan. She however cont to takes meds as above, should be ok. Checking level today. Had long discussion regarding laxatives and electrolytes imbalance, also wondering ACE. Referring to renal for further evaluation and management.  - CMP14+EGFR - Ambulatory referral to Nephrology  2. Essential hypertension At goal, cont low dose lisinopril for now, pending renals' thoughts.  - CBC with Differential/Platelet - CMP14+EGFR  3. Chronic diastolic CHF (congestive heart failure) ([place]) Patient seems to be euvolemic, cont with daily weights. - CMP14+EGFR  4. Severe pulmonary arterial systolic hypertension (HCC) Unclear etiology, defers further workup at this time, on continuous oxygen  5. Gastroesophageal reflux disease with esophagitis Restarting protonix.   6. Hypothyroidism, unspecified type - TSH  7. Constipation, unspecified constipation type H/o disimpaction in 2017. Currently reports well managed on current regime.  8. Hyperlipidemia, unspecified hyperlipidemia type On OTC supplements, given age, very reasonable  PMH, PSH, meds, allergies, Fhx, Shx, reviewed with patient today. I spent more than 30 minutes reviewing patient's multiple hospitalizations, labs, studies and specialist notes.   Other orders - ALPRAZolam (XANAX) 0.5 MG tablet; Take 0.5 tablets (0.25 mg total) by mouth 2 (two) times daily as needed for anxiety. - demeclocycline (DECLOMYCIN) 300 MG tablet; Take 0.5 tablets (150 mg total) by mouth 2 (two) times  daily. - fluticasone (FLONASE) 50 MCG/ACT nasal spray; Place 1 spray into both nostrils 2 (two) times daily. - levothyroxine (SYNTHROID, LEVOTHROID) 50 MCG tablet; Take 1 tablet (50 mcg total) by mouth daily before breakfast. - lisinopril (PRINIVIL,ZESTRIL) 5 MG tablet; Take 1 tablet (5 mg total) by mouth daily. - meclizine (ANTIVERT) 12.5 MG tablet; Take 1 tablet (12.5 mg total) by mouth 2 (two) times daily as needed for dizziness. - olopatadine (PATANOL) 0.1 % ophthalmic solution; Place 1 drop into both eyes daily as needed for allergies. - pantoprazole (PROTONIX)  40 MG tablet; Take 1 tablet (40 mg total) by mouth daily before supper. - cetirizine (ZYRTEC) 10 MG tablet; Take 0.5 tablets (5 mg total) by mouth daily. - propranolol ER (INDERAL LA) 60 MG 24 hr capsule; Take 1 capsule (60 mg total) by mouth daily.  Return in about 1 month (around 04/22/2018).    Rutherford Guys, MD Primary Care at [facility] [place], [place] [address] Ph.  [phone] Fax [phone]

## 2018-03-25 NOTE — Patient Instructions (Signed)
     IF you received an x-ray today, you will receive an invoice from [facility]. Please contact [facility] at [phone] with questions or concerns regarding your invoice.   IF you received labwork today, you will receive an invoice from [facility]. Please contact [facility] at [phone] with questions or concerns regarding your invoice.   Our billing staff will not be able to assist you with questions regarding bills from these companies.  You will be contacted with the lab results as soon as they are available. The fastest way to get your results is to activate your My Chart account. Instructions are located on the last page of this paperwork. If you have not heard from us regarding the results in 2 weeks, please contact this office.     

## 2018-03-26 LAB — CMP14+EGFR
ALT: 9 IU/L (ref 0–32)
AST: 17 IU/L (ref 0–40)
Albumin/Globulin Ratio: 1.2 (ref 1.2–2.2)
Albumin: 3.8 g/dL (ref 3.2–4.6)
Alkaline Phosphatase: 87 IU/L (ref 39–117)
BUN/Creatinine Ratio: 26 (ref 12–28)
BUN: 18 mg/dL (ref 10–36)
Bilirubin Total: 0.3 mg/dL (ref 0.0–1.2)
CO2: 34 mmol/L — ABNORMAL HIGH (ref 20–29)
Calcium: 10.1 mg/dL (ref 8.7–10.3)
Chloride: 94 mmol/L — ABNORMAL LOW (ref 96–106)
Creatinine, Ser: 0.7 mg/dL (ref 0.57–1.00)
GFR calc Af Amer: 88 mL/min/{1.73_m2} (ref >59)
GFR calc non Af Amer: 76 mL/min/{1.73_m2} (ref >59)
Globulin, Total: 3.3 g/dL (ref 1.5–4.5)
Glucose: 83 mg/dL (ref 65–99)
Potassium: 4.9 mmol/L (ref 3.5–5.2)
Sodium: 140 mmol/L (ref 134–144)
Total Protein: 7.1 g/dL (ref 6.0–8.5)

## 2018-03-26 LAB — CBC WITH DIFFERENTIAL/PLATELET
Basophils Absolute: 0 10*3/uL (ref 0.0–0.2)
Basos: 0 %
EOS (ABSOLUTE): 0.2 10*3/uL (ref 0.0–0.4)
Eos: 2 %
Hematocrit: 39.3 % (ref 34.0–46.6)
Hemoglobin: 12.3 g/dL (ref 11.1–15.9)
Immature Grans (Abs): 0 10*3/uL (ref 0.0–0.1)
Immature Granulocytes: 0 %
Lymphocytes Absolute: 3.2 10*3/uL — ABNORMAL HIGH (ref 0.7–3.1)
Lymphs: 37 %
MCH: 29.2 pg (ref 26.6–33.0)
MCHC: 31.3 g/dL — ABNORMAL LOW (ref 31.5–35.7)
MCV: 93 fL (ref 79–97)
Monocytes Absolute: 0.7 10*3/uL (ref 0.1–0.9)
Monocytes: 8 %
Neutrophils Absolute: 4.7 10*3/uL (ref 1.4–7.0)
Neutrophils: 53 %
Platelets: 299 10*3/uL (ref 150–379)
RBC: 4.21 x10E6/uL (ref 3.77–5.28)
RDW: 14.7 % (ref 12.3–15.4)
WBC: 8.8 10*3/uL (ref 3.4–10.8)

## 2018-03-26 LAB — TSH: TSH: 1.5 u[IU]/mL (ref 0.450–4.500)

## 2018-03-28 ENCOUNTER — Ambulatory Visit: Payer: Self-pay | Admitting: *Deleted

## 2018-03-28 NOTE — Telephone Encounter (Signed)
Pt calling with sx of sore throat, and sinus congestion. Pt states her O2 levels 92-93% on 1 LPM. Pt states that she has been having a lot of phlegm in the back of her throat and in her nose. Pt stated that she woke up this am with O2 sat of 87 on room air and her O2 cannula was not in her nose. Pt denies fever. Sx are runny nose, nasal congestion, hoarse, occasional cough. Pt uses Albuterol nebulizer and has been taking Tylenol prn for throat pain. Pt states that she has a slight headache in the morning and evening.  Care advice given. Advised pt to take neb, use saline spray to clean out nose and gargle with salt water and take a dose of tylenol before bed to see if that helps her sleep better.  Reason for Disposition . [1] Sore throat with cough/cold symptoms AND [2] present < 5 days  Answer Assessment - Initial Assessment Questions 1. ONSET: "When did the throat start hurting?" (Hours or days ago)      3 days ago 2. SEVERITY: "How bad is the sore throat?" (Scale 1-10; mild, moderate or severe)   - MILD (1-3):  doesn't interfere with eating or normal activities   - MODERATE (4-7): interferes with eating some solids and normal activities   - SEVERE (8-10):  excruciating pain, interferes with most normal activities   - SEVERE DYSPHAGIA: can't swallow liquids, drooling     mild 3. STREP EXPOSURE: "Has there been any exposure to strep within the past week?" If so, ask: "What type of contact occurred?"      no 4.  VIRAL SYMPTOMS: "Are there any symptoms of a cold, such as a runny nose, cough, hoarse voice or red eyes?"      occ runny nose, congested nose,, hoarse, occasional cough (uses Albuterol nebulizer-  5. FEVER: "Do you have a fever?" If so, ask: "What is your temperature, how was it measured, and when did it start?"     no 6. PUS ON THE TONSILS: "Is there pus on the tonsils in the back of your throat?"     no 7. OTHER SYMPTOMS: "Do you have any other symptoms?" (e.g., difficulty breathing,  headache, rash)     Headache in the am and in the pm 8. PREGNANCY: "Is there any chance you are pregnant?" "When was your last menstrual period?"     n/a  Protocols used: SORE THROAT-A-AH

## 2018-03-28 NOTE — Telephone Encounter (Signed)
Left message for pt to return call to the office to discuss symptoms. 

## 2018-03-31 ENCOUNTER — Telehealth: Payer: Self-pay | Admitting: Family Medicine

## 2018-03-31 ENCOUNTER — Encounter: Payer: Self-pay | Admitting: Family Medicine

## 2018-03-31 DIAGNOSIS — E785 Hyperlipidemia, unspecified: Secondary | ICD-10-CM | POA: Insufficient documentation

## 2018-03-31 NOTE — Telephone Encounter (Signed)
Copied from CRM [id number]. Topic: Quick Communication - See Telephone Encounter >> Mar 31, 2018 12:26 PM [name], NT wrote: CRM for notification. See Telephone encounter for: 03/31/18. Patient's son, Niomi Valent, calling to see if results from last visit were available. Patient is requesting a call to discuss results. Please advise. CB# for [place]: [phone]    CB# for Patient: [phone]

## 2018-03-31 NOTE — Telephone Encounter (Signed)
Advised on labs. Son stated that pt has not feeling well and has had some abdominal issue. Pt son stated that he would schedule a visit.

## 2018-04-03 ENCOUNTER — Ambulatory Visit: Payer: Self-pay | Admitting: Family Medicine

## 2018-04-08 ENCOUNTER — Ambulatory Visit: Payer: Self-pay | Admitting: Family Medicine

## 2018-04-10 ENCOUNTER — Other Ambulatory Visit: Payer: Self-pay

## 2018-04-10 ENCOUNTER — Ambulatory Visit (INDEPENDENT_AMBULATORY_CARE_PROVIDER_SITE_OTHER): Payer: Medicare Other | Admitting: Family Medicine

## 2018-04-10 ENCOUNTER — Other Ambulatory Visit: Payer: Self-pay | Admitting: Family Medicine

## 2018-04-10 ENCOUNTER — Encounter: Payer: Self-pay | Admitting: Family Medicine

## 2018-04-10 VITALS — BP 100/60 | HR 62 | Temp 98.2°F | Ht 62.0 in | Wt 175.0 lb

## 2018-04-10 DIAGNOSIS — E871 Hypo-osmolality and hyponatremia: Secondary | ICD-10-CM

## 2018-04-10 DIAGNOSIS — N898 Other specified noninflammatory disorders of vagina: Secondary | ICD-10-CM | POA: Diagnosis not present

## 2018-04-10 DIAGNOSIS — Z79899 Other long term (current) drug therapy: Secondary | ICD-10-CM

## 2018-04-10 DIAGNOSIS — K5900 Constipation, unspecified: Secondary | ICD-10-CM | POA: Diagnosis not present

## 2018-04-10 DIAGNOSIS — F411 Generalized anxiety disorder: Secondary | ICD-10-CM | POA: Diagnosis not present

## 2018-04-10 DIAGNOSIS — J302 Other seasonal allergic rhinitis: Secondary | ICD-10-CM

## 2018-04-10 MED ORDER — ALPRAZOLAM 1 MG PO TABS: 0.5000 mg | ORAL_TABLET | Freq: Two times a day (BID) | ORAL | 3 refills | Status: DC

## 2018-04-10 MED ORDER — SENNA-DOCUSATE SODIUM 8.6-50 MG PO TABS: 1.0000 | ORAL_TABLET | Freq: Every day | ORAL | 0 refills | Status: DC

## 2018-04-10 NOTE — Progress Notes (Signed)
5/9/201912:28 PM  Tammy Fernandez [birth year], 84 y.o. female [id number]  Chief Complaint  Patient presents with  . Vaginal Pain    having aching and burning in the vaginal area since Tuesday. Say she feels that "something is growing down there"  . Sinus Problem    sinus pressure and headaches    HPI:   Patient is a 84 y.o. female with past medical history significant for severe hyponatremia, constipation with dependence on enemas, anxiety on long term bzd, HTN, CVA and dCHF who presents today with several concerns:  1. Hyponatremia - has not heard from renal yet, cont to take meds as prescribed 2. Constipation - miralax caused her to have an upset stomach, has returned to daily enemas. ducolax has not helped in the past, has never tried lactulose 3. Was having vaginal irritation, burning and itchiness. fells as is she has something in her vagina. Has been doing monostat OTC which has been helping with symptoms, she would like to make sure she does not have any growths or other finding of concern 4. Having increased sinus pressure and frontal headaches. She has not been doing her flonase. She denies any frank pain, fever or chills. She denies any nasal drainage.  5. Requesting refill of xanax, she would like for it to be written as it has been for years, (I had recently refilled with 0.5mg  instead of  trying to make it easier for her)  Fall Risk  04/10/2018 03/25/2018 03/12/2014  Falls in the past year? No No No  Risk for fall due to : - - Impaired balance/gait;Impaired mobility     Depression screen [facility] 2/9 04/10/2018 03/25/2018 03/12/2014  Decreased Interest 0 (No Data) 0  Down, Depressed, Hopeless 0 - 0  PHQ - 2 Score 0 - 0    Allergies  Allergen Reactions  . Apple Flavor Hives    No apple sauce  . Crestor [Rosuvastatin Calcium] Other (See Comments)    Muscle Aches  . Keflex [Cephalexin] Other (See Comments)    dizziness  . Peanut Butter Flavor Hives  . Sulfa Antibiotics  Hives  . Cephalexin Other (See Comments)    dizziness  . Crestor [Rosuvastatin Calcium]   . Sulfa Drugs Cross Reactors Rash    Prior to Admission medications   Medication Sig Start Date End Date Taking? Authorizing Provider  albuterol (PROVENTIL) (2.5 MG/3ML) 0.083% nebulizer solution Take 3 mLs (2.5 mg total) by nebulization every 6 (six) hours as needed for wheezing or shortness of breath. 11/01/15  Yes Tat, Onalee Hua, MD  ALPRAZolam Prudy Feeler) 0.5 MG tablet Take 0.5 tablets (0.25 mg total) by mouth 2 (two) times daily as needed for anxiety. 03/25/18  Yes Myles Lipps, MD  Ascorbic Acid (VITAMIN C PO) Take 1 tablet by mouth daily.   Yes [provider]  Aspirin 81 MG EC tablet Take 81 mg by mouth daily.   Yes [provider]  calcium carbonate (TUMS - DOSED IN MG ELEMENTAL CALCIUM) 500 MG chewable tablet Chew 1 tablet by mouth daily as needed for indigestion or heartburn.   Yes [provider]  cetirizine (ZYRTEC) 10 MG tablet Take 0.5 tablets (5 mg total) by mouth daily. 03/25/18  Yes Myles Lipps, MD  demeclocycline (DECLOMYCIN) 300 MG tablet Take 0.5 tablets (150 mg total) by mouth 2 (two) times daily. 03/25/18  Yes Myles Lipps, MD  fluticasone (FLONASE) 50 MCG/ACT nasal spray Place 1 spray into both nostrils 2 (two) times daily. 03/25/18  Yes Myles Lipps, MD  levothyroxine (SYNTHROID, LEVOTHROID) 50 MCG tablet Take 1 tablet (50 mcg total) by mouth daily before breakfast. 03/25/18  Yes Myles Lipps, MD  lisinopril (PRINIVIL,ZESTRIL) 5 MG tablet Take 1 tablet (5 mg total) by mouth daily. 03/25/18  Yes Myles Lipps, MD  meclizine (ANTIVERT) 12.5 MG tablet Take 1 tablet (12.5 mg total) by mouth 2 (two) times daily as needed for dizziness. 03/25/18  Yes Myles Lipps, MD  olopatadine (PATANOL) 0.1 % ophthalmic solution Place 1 drop into both eyes daily as needed for allergies. 03/25/18  Yes Myles Lipps, MD  pantoprazole (PROTONIX) 40 MG tablet Take  1 tablet (40 mg total) by mouth daily before supper. 03/25/18  Yes Myles Lipps, MD  propranolol ER (INDERAL LA) 60 MG 24 hr capsule Take 1 capsule (60 mg total) by mouth daily. 03/25/18  Yes Myles Lipps, MD    Past Medical History:  Diagnosis Date  . Anxiety   . Chronic lower back pain   . Constipation   . CVA (cerebral vascular accident) (HCC) 2015   neg workup, MRI small acute lacunar infarcts. ASA only  . Depression   . Diastolic CHF, chronic (HCC)    grade I, most recent echo Jan 2019  . Fatty liver   . GERD (gastroesophageal reflux disease)   . Hyperlipidemia   . Hypertension   . Hyponatremia    multiple episodes with encephalopathy, renal thinks SIADH  . Hypothyroidism, unspecified   . Internal hemorrhoid   . NSTEMI (non-ST elevated myocardial infarction) (HCC) 08/28/2016  . Palpitations   . PFO with atrial septal aneurysm    TTE 2015  . Pneumonia 2016  . Severe pulmonary arterial systolic hypertension (HCC) 12/25/2017   per echo, on 1L oxgen via [place]    Past Surgical History:  Procedure Laterality Date  . ABDOMINAL HYSTERECTOMY    . CATARACT EXTRACTION W/ INTRAOCULAR LENS  IMPLANT, BILATERAL Bilateral   . CESAREAN SECTION    . TEE WITHOUT CARDIOVERSION N/A 01/19/2014   Procedure: TRANSESOPHAGEAL ECHOCARDIOGRAM (TEE);  Surgeon: Laurey Morale, MD;  Location: [facility] ENDOSCOPY;  Service: Cardiovascular;  Laterality: N/A;  [name]  . VAGINAL HYSTERECTOMY      Social History   Tobacco Use  . Smoking status: Never Smoker  . Smokeless tobacco: Former Neurosurgeon    Types: Snuff  . Tobacco comment: "used snuff when I was 19"  Substance Use Topics  . Alcohol use: No    Family History  Problem Relation Age of Onset  . Stroke Father   . Hypertension Father   . Colon cancer Cousin   . Colon cancer Unknown        Aunt  . Diabetes Unknown        aunt and cousins  . Heart disease Unknown        cousins  . Healthy Daughter   . Healthy Son     Review of Systems    Constitutional: Negative for chills and fever.  HENT: Positive for congestion.   Respiratory: Negative for cough and shortness of breath.   Cardiovascular: Negative for chest pain, palpitations and leg swelling.  Gastrointestinal: Positive for constipation. Negative for abdominal pain, nausea and vomiting.  Genitourinary: Negative for dysuria and hematuria.  Neurological: Positive for headaches.     OBJECTIVE:  Blood pressure 100/60, pulse 62, temperature 98.2 F (36.8 C), temperature source Oral, height  (1.575 m), weight 175 lb (79.4 kg), SpO2 98 %.  Wt Readings from Last 3 Encounters:  04/10/18 175 lb (79.4 kg)  03/25/18 172 lb (78 kg)  12/29/17 186 lb 4.6 oz (84.5 kg)    Physical Exam  Constitutional: She is oriented to person, place, and time. She appears well-developed and well-nourished.  HENT:  Head: Normocephalic and atraumatic.  Right Ear: Hearing, tympanic membrane, external ear and ear canal normal.  Left Ear: Hearing, tympanic membrane, external ear and ear canal normal.  Mouth/Throat: Oropharynx is clear and moist.  Eyes: Pupils are equal, round, and reactive to light. EOM are normal.  Neck: Neck supple.  Cardiovascular: Normal rate, regular rhythm and normal heart sounds. Exam reveals no gallop and no friction rub.  No murmur heard. Pulmonary/Chest: Effort normal and breath sounds normal. She has no wheezes. She has no rales.  Abdominal: Soft. Bowel sounds are normal. She exhibits no distension. There is no tenderness. There is no guarding.  Genitourinary: There is no rash or lesion on the right labia. There is no rash or lesion on the left labia. No erythema or bleeding in the vagina. No vaginal discharge found.  Genitourinary Comments: Cervix/uterus/adnexa surgically absent. No evidence of cystocele or rectocele on exam.  Lymphadenopathy:    She has no cervical adenopathy.  Neurological: She is alert and oriented to person, place, and time.  Skin: Skin  is warm and dry.  Nursing note and vitals reviewed.    ASSESSMENT and PLAN  1. Hyponatremia Last sodium WNL, rechecking today. Provided today renal contact info. I have asked that they schedule an appointment. In the meanwhile cont with demeoclocycline and salt tabs. BP ok.  - Basic metabolic panel  2. Constipation, unspecified constipation type Patient with h/o fecal disimpaction in 2017. Will do trial of sennakot, strongly encourage against enema given profound electrolytes abnormalities in the past. Consider GI referral.  3. Vaginal irritation Patient s/p complete hysterectomy. Patient has been using OTC vaginal yeast treatment. Exam today normal. Will continue to monitor.  4. Anxiety state 5. Long term prescription benzodiazepine use Patient is 84 yo and has been on same dose of xanax for many years without any issues, risk of withdrawals outweighs an age related risk of bzd use. Will continue current prescription.  6. Seasonal allergies Discussed restarting flonase. Advised use of nasal saline washes.   Other orders - sennosides-docusate sodium (SENOKOT-S) 8.6-50 MG tablet; Take 1-2 tablets by mouth daily. - ALPRAZolam (XANAX) 1 MG tablet; Take 0.5 tablets (0.5 mg total) by mouth 2 (two) times daily.  Return in about 2 weeks (around 04/24/2018).    Myles Lipps, MD Primary Care at [facility] [address] [place], [place] [address] Ph.  [phone] Fax [phone]

## 2018-04-10 NOTE — Telephone Encounter (Signed)
Pt called to check on status of refill  

## 2018-04-10 NOTE — Patient Instructions (Addendum)
1. Dr Arlean Hopping at [facility] [address] [place] [id number]    IF you received an x-ray today, you will receive an invoice from [facility] Radiology. Please contact [facility] Radiology at [phone] with questions or concerns regarding your invoice.   IF you received labwork today, you will receive an invoice from [facility]. Please contact [facility] at [phone] with questions or concerns regarding your invoice.   Our billing staff will not be able to assist you with questions regarding bills from these companies.  You will be contacted with the lab results as soon as they are available. The fastest way to get your results is to activate your My Chart account. Instructions are located on the last page of this paperwork. If you have not heard from [place] regarding the results in 2 weeks, please contact this office.

## 2018-04-10 NOTE — Telephone Encounter (Signed)
Patient was seen today. Received medication 04/10/18

## 2018-04-11 ENCOUNTER — Telehealth: Payer: Self-pay

## 2018-04-11 LAB — BASIC METABOLIC PANEL
BUN/Creatinine Ratio: 21 (ref 12–28)
BUN: 15 mg/dL (ref 10–36)
CO2: 35 mmol/L — ABNORMAL HIGH (ref 20–29)
Calcium: 10 mg/dL (ref 8.7–10.3)
Chloride: 94 mmol/L — ABNORMAL LOW (ref 96–106)
Creatinine, Ser: 0.72 mg/dL (ref 0.57–1.00)
GFR calc Af Amer: 85 mL/min/{1.73_m2} (ref >59)
GFR calc non Af Amer: 73 mL/min/{1.73_m2} (ref >59)
Glucose: 80 mg/dL (ref 65–99)
Potassium: 4.6 mmol/L (ref 3.5–5.2)
Sodium: 140 mmol/L (ref 134–144)

## 2018-04-11 NOTE — Telephone Encounter (Signed)
I see patient on the 23rd of may. Will discuss then.

## 2018-04-11 NOTE — Telephone Encounter (Signed)
Pt called and stated that she needs referral to cardiologist for Heart Failure. Do not see this in your notes but patient states that she has home nurses that come and evaluate her for this. Would you like new OV?

## 2018-04-11 NOTE — Telephone Encounter (Signed)
ALPRAZolam (XANAX) 1 MG tablet    This was not sent in yet to  [facility] [id number] [place], [place] - [address] AT [facility] OF [address] [facility] [address]  [address] [place] [address]  Phone: [phone] Fax: [phone]

## 2018-04-12 ENCOUNTER — Other Ambulatory Visit: Payer: Self-pay | Admitting: Family Medicine

## 2018-04-14 ENCOUNTER — Telehealth: Payer: Self-pay | Admitting: Family Medicine

## 2018-04-14 NOTE — Telephone Encounter (Signed)
Copied from CRM [id number]. Topic: Quick Communication - See Telephone Encounter >> Apr 14, 2018  2:09 PM Terisa Starr wrote: CRM for notification. See Telephone encounter for: 04/14/18.  Pharmacy states they did not receive the prescription refill for ALPRAZolam (XANAX) 1 MG tablet. Patient was in the office on 5/9 and it was faxed over per snap shot.   [id number] - [place], [place] - [address] AT [facility] OF [address] & [facility] [address]

## 2018-04-14 NOTE — Telephone Encounter (Signed)
Meclizine refill Last OV:03/25/18 Last refill:03/25/18 30 tab/0 refill ZOX:[id number] Pharmacy: [facility] [id number] - [place], [place] - [address] AT [facility] [address] & [facility] [address] [phone] (Phone) [phone] (Fax)

## 2018-04-15 NOTE — Telephone Encounter (Signed)
Phone call to pharmacy. Medication was never received, called in alprazolam  tablet to pharmacist.   Phone call to patient. Patient notified of the above, she is appreciative of call.

## 2018-04-15 NOTE — Telephone Encounter (Signed)
Xanax 1 mg refill request  LOV 04/10/18 with [place].  Last refill:  04/10/18  #60  [id number] - [place], [place] - [address]

## 2018-04-23 ENCOUNTER — Encounter: Payer: Self-pay | Admitting: Family Medicine

## 2018-04-23 DIAGNOSIS — Z79899 Other long term (current) drug therapy: Secondary | ICD-10-CM | POA: Insufficient documentation

## 2018-04-24 ENCOUNTER — Encounter: Payer: Self-pay | Admitting: Family Medicine

## 2018-04-24 ENCOUNTER — Ambulatory Visit (INDEPENDENT_AMBULATORY_CARE_PROVIDER_SITE_OTHER): Payer: Medicare Other | Admitting: Family Medicine

## 2018-04-24 ENCOUNTER — Other Ambulatory Visit: Payer: Self-pay

## 2018-04-24 VITALS — BP 138/72 | HR 70 | Temp 98.5°F | Ht 62.0 in | Wt 175.0 lb

## 2018-04-24 DIAGNOSIS — J302 Other seasonal allergic rhinitis: Secondary | ICD-10-CM

## 2018-04-24 DIAGNOSIS — K5900 Constipation, unspecified: Secondary | ICD-10-CM | POA: Diagnosis not present

## 2018-04-24 DIAGNOSIS — E871 Hypo-osmolality and hyponatremia: Secondary | ICD-10-CM | POA: Diagnosis not present

## 2018-04-24 DIAGNOSIS — K219 Gastro-esophageal reflux disease without esophagitis: Secondary | ICD-10-CM

## 2018-04-24 DIAGNOSIS — I5032 Chronic diastolic (congestive) heart failure: Secondary | ICD-10-CM | POA: Diagnosis not present

## 2018-04-24 MED ORDER — DEMECLOCYCLINE HCL 300 MG PO TABS: 150.0000 mg | ORAL_TABLET | Freq: Two times a day (BID) | ORAL | 0 refills | Status: DC

## 2018-04-24 MED ORDER — RANITIDINE HCL 300 MG PO TABS: 300.0000 mg | ORAL_TABLET | Freq: Every day | ORAL | 1 refills | Status: DC

## 2018-04-24 NOTE — Progress Notes (Signed)
5/23/20192:06 PM  Tammy Fernandez October 07, 1924, 84 y.o. female [id number]  Chief Complaint  Patient presents with  . Follow-up    not having any pain below anymore, having somewhat upset stomach for a few days with no vomiting  . Referral    asking for referral for Cardiologist Corine Shelter PA-C  of [facility]    HPI:   Patient is a 84 y.o. female who presents today for followup  1. Hyponatremia - last na 140, cont to takes meds as prescribed. Has not heard nor reached out to renal 2. Constipation, doing well on sennakot, ha not needed an enema 3. Allergies, still having nasal congestion, post nasal drip - causing an upset stomach, sneezing. Not using flonase daily. Not doing nasal saline washes. She is on oxygen via [place] 24/7 4. GERD - increasing frequency, exacerbated by PND but also has been wo ranitidine for several weeks. Requesting refill 5. Requesting referral for cardiology give h/o dCHF and NSTEMI  Fall Risk  04/24/2018 04/10/2018 03/25/2018 03/12/2014  Falls in the past year? No No No No  Risk for fall due to : - - - Impaired balance/gait;Impaired mobility     Depression screen [facility] 2/9 04/24/2018 04/10/2018 03/25/2018  Decreased Interest 0 0 (No Data)  Down, Depressed, Hopeless 0 0 -  PHQ - 2 Score 0 0 -    Allergies  Allergen Reactions  . Apple Flavor Hives    No apple sauce  . Crestor [Rosuvastatin Calcium] Other (See Comments)    Muscle Aches  . Keflex [Cephalexin] Other (See Comments)    dizziness  . Peanut Butter Flavor Hives  . Sulfa Antibiotics Hives  . Cephalexin Other (See Comments)    dizziness  . Crestor [Rosuvastatin Calcium]   . Sulfa Drugs Cross Reactors Rash    Prior to Admission medications   Medication Sig Start Date End Date Taking? Authorizing Provider  albuterol (PROVENTIL) (2.5 MG/3ML) 0.083% nebulizer solution Take 3 mLs (2.5 mg total) by nebulization every 6 (six) hours as needed for wheezing or shortness of breath. 11/01/15  Yes Tat,  Onalee Hua, MD  ALPRAZolam Prudy Feeler) 1 MG tablet Take 0.5 tablets (0.5 mg total) by mouth 2 (two) times daily. 04/10/18  Yes Myles Lipps, MD  Ascorbic Acid (VITAMIN C PO) Take 1 tablet by mouth daily.   Yes [provider]  Aspirin 81 MG EC tablet Take 81 mg by mouth daily.   Yes [provider]  calcium carbonate (TUMS - DOSED IN MG ELEMENTAL CALCIUM) 500 MG chewable tablet Chew 1 tablet by mouth daily as needed for indigestion or heartburn.   Yes [provider]  cetirizine (ZYRTEC) 10 MG tablet Take 0.5 tablets (5 mg total) by mouth daily. 03/25/18  Yes Myles Lipps, MD  demeclocycline (DECLOMYCIN) 300 MG tablet Take 0.5 tablets (150 mg total) by mouth 2 (two) times daily. 03/25/18  Yes Myles Lipps, MD  fluticasone (FLONASE) 50 MCG/ACT nasal spray Place 1 spray into both nostrils 2 (two) times daily. 03/25/18  Yes Myles Lipps, MD  levothyroxine (SYNTHROID, LEVOTHROID) 50 MCG tablet Take 1 tablet (50 mcg total) by mouth daily before breakfast. 03/25/18  Yes Myles Lipps, MD  lisinopril (PRINIVIL,ZESTRIL) 5 MG tablet Take 1 tablet (5 mg total) by mouth daily. 03/25/18  Yes Myles Lipps, MD  meclizine (ANTIVERT) 12.5 MG tablet TAKE 1 TABLET BY MOUTH TWICE DAILY AS NEEDED FOR DIZZINESS 04/17/18  Yes Myles Lipps, MD  olopatadine (  PATANOL) 0.1 % ophthalmic solution Place 1 drop into both eyes daily as needed for allergies. 03/25/18  Yes Myles Lipps, MD  pantoprazole (PROTONIX) 40 MG tablet Take 1 tablet (40 mg total) by mouth daily before supper. 03/25/18  Yes Myles Lipps, MD  propranolol ER (INDERAL LA) 60 MG 24 hr capsule Take 1 capsule (60 mg total) by mouth daily. 03/25/18  Yes Myles Lipps, MD  ranitidine (ZANTAC) 300 MG tablet Take 300 mg by mouth. 02/26/18  Yes [provider]  sennosides-docusate sodium (SENOKOT-S) 8.6-50 MG tablet Take 1-2 tablets by mouth daily. 04/10/18  Yes Myles Lipps, MD    Past Medical History:    Diagnosis Date  . Anxiety   . Chronic lower back pain   . Constipation    h/o fecal disimpaction on 2017  . CVA (cerebral vascular accident) (HCC) 2015   neg workup, MRI small acute lacunar infarcts. ASA only  . Depression   . Diastolic CHF, chronic (HCC)    grade I, most recent echo Jan 2019  . Fatty liver   . GERD (gastroesophageal reflux disease)   . Hyperlipidemia   . Hypertension   . Hyponatremia    multiple episodes with encephalopathy, renal thinks SIADH  . Hypothyroidism, unspecified   . Internal hemorrhoid   . NSTEMI (non-ST elevated myocardial infarction) (HCC) 08/28/2016  . Palpitations   . PFO with atrial septal aneurysm    TTE 2015  . Pneumonia 2016  . Severe pulmonary arterial systolic hypertension (HCC) 12/25/2017   per echo, on 1L oxgen via [place]    Past Surgical History:  Procedure Laterality Date  . ABDOMINAL HYSTERECTOMY    . CATARACT EXTRACTION W/ INTRAOCULAR LENS  IMPLANT, BILATERAL Bilateral   . CESAREAN SECTION    . TEE WITHOUT CARDIOVERSION N/A 01/19/2014   Procedure: TRANSESOPHAGEAL ECHOCARDIOGRAM (TEE);  Surgeon: Laurey Morale, MD;  Location: [facility] ENDOSCOPY;  Service: Cardiovascular;  Laterality: N/A;  [name]  . VAGINAL HYSTERECTOMY      Social History   Tobacco Use  . Smoking status: Never Smoker  . Smokeless tobacco: Former Neurosurgeon    Types: Snuff  . Tobacco comment: "used snuff when I was 19"  Substance Use Topics  . Alcohol use: No    Family History  Problem Relation Age of Onset  . Stroke Father   . Hypertension Father   . Colon cancer Cousin   . Colon cancer Unknown        Aunt  . Diabetes Unknown        aunt and cousins  . Heart disease Unknown        cousins  . Healthy Daughter   . Healthy Son     Review of Systems  Constitutional: Negative for chills and fever.  Respiratory: Negative for cough and shortness of breath.   Cardiovascular: Negative for chest pain, palpitations and leg swelling.  Gastrointestinal: Positive  for heartburn and vomiting. Negative for abdominal pain, blood in stool, diarrhea, melena and nausea.  Genitourinary: Positive for frequency and urgency.     OBJECTIVE:  Blood pressure 138/72, pulse 70, temperature 98.5 F (36.9 C), temperature source Oral, height  (1.575 m), weight 175 lb (79.4 kg).  Wt Readings from Last 3 Encounters:  04/24/18 175 lb (79.4 kg)  04/10/18 175 lb (79.4 kg)  03/25/18 172 lb (78 kg)    Physical Exam  Constitutional: She is oriented to person, place, and time. She appears well-developed and well-nourished.  In wheelchair, using her oxygen  HENT:  Head: Normocephalic and atraumatic.  Nose: Mucosal edema present.  Mouth/Throat: Oropharynx is clear and moist. No oropharyngeal exudate.  Eyes: Pupils are equal, round, and reactive to light. EOM are normal. No scleral icterus.  Neck: Neck supple.  Cardiovascular: Normal rate, regular rhythm and normal heart sounds. Exam reveals no gallop and no friction rub.  No murmur heard. Pulmonary/Chest: Effort normal and breath sounds normal. She has no wheezes. She has no rales.  Musculoskeletal: She exhibits no edema.  Neurological: She is alert and oriented to person, place, and time.  Skin: Skin is warm and dry.  Psychiatric: She has a normal mood and affect.  Nursing note and vitals reviewed.   ASSESSMENT and PLAN  1. Hyponatremia Last sodium WNL, I have asked them again to reach out to renal to make an appt, in the meanwhile cont current regime, recheck bmp at next visit  2. Constipation, unspecified constipation type Currently doing well on sennakot, consider mg oxide in future if needed. Strongly encouraged against enema  3. Seasonal allergies Strongly encouraged daily use of flonase and nasal saline washes  4. Gastroesophageal reflux disease without esophagitis Refilling ranitidine  5. Chronic diastolic CHF (congestive heart failure) (HCC) - Ambulatory referral to Cardiology  Other  orders - ranitidine (ZANTAC) 300 MG tablet; Take 1 tablet (300 mg total) by mouth at bedtime. - demeclocycline (DECLOMYCIN) 300 MG tablet; Take 0.5 tablets (150 mg total) by mouth 2 (two) times daily.  Return in about 1 month (around 05/22/2018).    Myles Lipps, MD Primary Care at [facility] [address] [place], [place] [address] Ph.  [phone] Fax [phone]

## 2018-04-24 NOTE — Patient Instructions (Addendum)
1. Dr Arlean Hopping at [facility] [address] [place] [phone]  2. Continue with sennakot daily  3. I strongly advise for consistent use of flonase and nasal saline washes, twice a day  4. Lets restart ranitidine  once a day  5. Referring to cardiology as requested   IF you received an x-ray today, you will receive an invoice from [facility] Radiology. Please contact [facility] Radiology at [phone] with questions or concerns regarding your invoice.   IF you received labwork today, you will receive an invoice from [facility]. Please contact [facility] at [phone] with questions or concerns regarding your invoice.   Our billing staff will not be able to assist you with questions regarding bills from these companies.  You will be contacted with the lab results as soon as they are available. The fastest way to get your results is to activate your My Chart account. Instructions are located on the last page of this paperwork. If you have not heard from [place] regarding the results in 2 weeks, please contact this office.

## 2018-04-30 ENCOUNTER — Other Ambulatory Visit: Payer: Self-pay | Admitting: Family Medicine

## 2018-04-30 DIAGNOSIS — I38 Endocarditis, valve unspecified: Secondary | ICD-10-CM | POA: Diagnosis not present

## 2018-04-30 DIAGNOSIS — J9601 Acute respiratory failure with hypoxia: Secondary | ICD-10-CM | POA: Diagnosis not present

## 2018-05-01 ENCOUNTER — Telehealth: Payer: Self-pay | Admitting: Family Medicine

## 2018-05-01 NOTE — Telephone Encounter (Signed)
Meclizine refill Last OV:04/10/18 Last refill:04/17/18 30 tab/0 refills [id number] Pharmacy: [facility] [id number] - [place], [place] - [address] AT [facility] [address] & [facility] [place] [phone] (Phone) [phone] (Fax)

## 2018-05-01 NOTE — Telephone Encounter (Signed)
Copied from CRM [id number]. Topic: Quick Communication - Rx Refill/Question >> May 01, 2018  2:57 PM Rudi Coco, [place] wrote: Medication: meclizine (ANTIVERT) 12.5 MG tablet [[id number]]   Has the patient contacted their pharmacy? yes (Agent: If no, request that the patient contact the pharmacy for the refill.) (Agent: If yes, when and what did the pharmacy advise?)  Preferred Pharmacy (with phone number or street name): [id number] - [place], [place] - [address] AT [facility] [address] & [facility] [address] [place] [address] Phone: [phone] Fax: [phone]    Agent: Please be advised that RX refills may take up to 3 business days. We ask that you follow-up with your pharmacy.

## 2018-05-02 NOTE — Telephone Encounter (Signed)
Patient called and said that the pharmacy stated that she need an appointment before this could be refilled.  She was just seen on 04/24/18.  Spoke with Carollee Herter and she said that she would look into it.

## 2018-05-02 NOTE — Telephone Encounter (Signed)
[facility] regarding prescription of Meclizine. Pharmacy states that the pt picked up prescription on 5/21. Pt called and the pt states she did pick up the prescription but she lost the remainder of the pills on that prescription. Pt asking if another prescription could be called in for Meclizine. Pt states she is aware that her insurance may not cover the cost and states she will pay out of pocket if needed.

## 2018-05-03 ENCOUNTER — Other Ambulatory Visit: Payer: Self-pay | Admitting: Family Medicine

## 2018-05-05 NOTE — Telephone Encounter (Signed)
Please schedule. Pt needs office for reevaluation.

## 2018-05-06 ENCOUNTER — Telehealth: Payer: Self-pay | Admitting: Family Medicine

## 2018-05-06 MED ORDER — MECLIZINE HCL 12.5 MG PO TABS: 12.5000 mg | ORAL_TABLET | Freq: Every day | ORAL | 0 refills | Status: DC | PRN

## 2018-05-06 NOTE — Telephone Encounter (Unsigned)
Copied from CRM [id number]. Topic: Quick Communication - Rx Refill/Question >> May 01, 2018  2:57 PM Rudi CocoLathan, Latoya M, [place] wrote: Medication: meclizine (ANTIVERT) 12.5 MG tablet [[id number]][[id number]]   Has the patient contacted their pharmacy? yes (Agent: If no, request that the patient contact the pharmacy for the refill.) (Agent: If yes, when and what did the pharmacy advise?)  Preferred Pharmacy (with phone number or street name): [id number] [place] - [address] AT [facility] OF [address] & [facility] [address] [place] [address] Phone: [phone] Fax: [phone]    Agent: Please be advised that RX refills may take up to 3 business days. We ask that you follow-up with your pharmacy. >> May 05, 2018 11:42 AM Floria RavelingStovall, Shana A wrote: Pt called back in and stated she was not sure why she needed to make another appt .  She stated she was just seen on 5/23 and has a fu appt on the 6/27?  Please advise

## 2018-05-06 NOTE — Telephone Encounter (Signed)
LOV  04/24/18 Dr. Leretha PolSantiago Last refill 04/17/18

## 2018-05-06 NOTE — Telephone Encounter (Signed)
Relation to pt: self Call back [phone]  [id number] [place] [facility] [address] [facility] [address] [facility] [phone] (Phone) [phone])    Reason for call:  Patient would like to speak to the nurse regarding why she needs another appointment, patient states her allergies are not improving, and would like meclizine (ANTIVERT) 12.5 MG tablet sent in again, please advise

## 2018-05-06 NOTE — Addendum Note (Signed)
Addended by: Myles LippsSANTIAGO, Emaly Boschert M on: 05/06/2018 10:46 PM   Modules accepted: Orders

## 2018-05-06 NOTE — Telephone Encounter (Signed)
Please advise 

## 2018-05-06 NOTE — Telephone Encounter (Signed)
refilled 

## 2018-05-21 DIAGNOSIS — I38 Endocarditis, valve unspecified: Secondary | ICD-10-CM | POA: Diagnosis not present

## 2018-05-21 DIAGNOSIS — J9601 Acute respiratory failure with hypoxia: Secondary | ICD-10-CM | POA: Diagnosis not present

## 2018-05-23 ENCOUNTER — Encounter: Payer: Self-pay | Admitting: Cardiology

## 2018-05-23 ENCOUNTER — Ambulatory Visit (INDEPENDENT_AMBULATORY_CARE_PROVIDER_SITE_OTHER): Payer: Medicare Other | Admitting: Cardiology

## 2018-05-23 VITALS — BP 136/58 | HR 65 | Ht 62.0 in | Wt 177.0 lb

## 2018-05-23 DIAGNOSIS — I10 Essential (primary) hypertension: Secondary | ICD-10-CM

## 2018-05-23 DIAGNOSIS — E871 Hypo-osmolality and hyponatremia: Secondary | ICD-10-CM | POA: Diagnosis not present

## 2018-05-23 DIAGNOSIS — I5032 Chronic diastolic (congestive) heart failure: Secondary | ICD-10-CM

## 2018-05-23 DIAGNOSIS — R0789 Other chest pain: Secondary | ICD-10-CM

## 2018-05-23 DIAGNOSIS — Z8673 Personal history of transient ischemic attack (TIA), and cerebral infarction without residual deficits: Secondary | ICD-10-CM

## 2018-05-23 DIAGNOSIS — I2721 Secondary pulmonary arterial hypertension: Secondary | ICD-10-CM

## 2018-05-23 DIAGNOSIS — J9621 Acute and chronic respiratory failure with hypoxia: Secondary | ICD-10-CM | POA: Insufficient documentation

## 2018-05-23 DIAGNOSIS — R9431 Abnormal electrocardiogram [ECG] [EKG]: Secondary | ICD-10-CM

## 2018-05-23 DIAGNOSIS — J9611 Chronic respiratory failure with hypoxia: Secondary | ICD-10-CM | POA: Diagnosis not present

## 2018-05-23 NOTE — Progress Notes (Signed)
05/23/2018 Tammy Fernandez   1924/03/19  [id number]  Primary Physician Myles Lipps, MD Primary Cardiologist: New- will be Dr Allyson Sabal  HPI:  84 y/o female seen by Dr Sharyn Lull in 2014 for chest pain and had a low risk Myoview. She had a TEE in 2015 when she was being worked up for a stroke. Dr Elease Hashimoto saw her once in 2017 when she presented with SSCP and had mildly elevated Troponin. He felt she was not a candidate for for further work up. She has no history of documented CAD. In Jan 2019 she presented with respiratory failure failure felt to be secondary to diastolic CHF and pulmonary HTN. She was also markedly hyponatremic, some of this may have been from laxative abuse as the patient says she was "cleaning her colon".  Since Jan she has done well, though now she is on chronic O2. Her mobility is limited, mainly wheel chair.  She was referred to [place] because of her diagnosis of "heart failure". Her son accompanied her to the office today and was very helpful during the interview.    Current Outpatient Medications  Medication Sig Dispense Refill  . albuterol (PROVENTIL) (2.5 MG/3ML) 0.083% nebulizer solution Take 3 mLs (2.5 mg total) by nebulization every 6 (six) hours as needed for wheezing or shortness of breath. 75 mL 1  . ALPRAZolam (XANAX) 1 MG tablet Take 0.5 tablets (0.5 mg total) by mouth 2 (two) times daily. 60 tablet 3  . Ascorbic Acid (VITAMIN C PO) Take 1 tablet by mouth daily.    . Aspirin 81 MG EC tablet Take 81 mg by mouth daily.    . calcium carbonate (TUMS - DOSED IN MG ELEMENTAL CALCIUM) 500 MG chewable tablet Chew 1 tablet by mouth daily as needed for indigestion or heartburn.    . cetirizine (ZYRTEC) 10 MG tablet Take 0.5 tablets (5 mg total) by mouth daily. 30 tablet 5  . demeclocycline (DECLOMYCIN) 300 MG tablet Take 0.5 tablets (150 mg total) by mouth 2 (two) times daily. 30 tablet 0  . fluticasone (FLONASE) 50 MCG/ACT nasal spray Place 1 spray into both nostrils 2 (two)  times daily. 16 g 5  . levothyroxine (SYNTHROID, LEVOTHROID) 50 MCG tablet Take 1 tablet (50 mcg total) by mouth daily before breakfast. 30 tablet 5  . lisinopril (PRINIVIL,ZESTRIL) 5 MG tablet Take 1 tablet (5 mg total) by mouth daily. 30 tablet 5  . meclizine (ANTIVERT) 12.5 MG tablet Take 1 tablet (12.5 mg total) by mouth daily as needed for dizziness. 30 tablet 0  . olopatadine (PATANOL) 0.1 % ophthalmic solution Place 1 drop into both eyes daily as needed for allergies. 5 mL 5  . propranolol ER (INDERAL LA) 60 MG 24 hr capsule Take 1 capsule (60 mg total) by mouth daily. 30 capsule 5   No current facility-administered medications for this visit.     Allergies  Allergen Reactions  . Apple Flavor Hives    No apple sauce  . Crestor [Rosuvastatin Calcium] Other (See Comments)    Muscle Aches  . Keflex [Cephalexin] Other (See Comments)    dizziness  . Peanut Butter Flavor Hives  . Sulfa Antibiotics Hives  . Cephalexin Other (See Comments)    dizziness  . Crestor [Rosuvastatin Calcium]   . Sulfa Drugs Cross Reactors Rash    Past Medical History:  Diagnosis Date  . Anxiety   . Chronic lower back pain   . Constipation    h/o fecal disimpaction on  2017  . CVA (cerebral vascular accident) (HCC) 2015   neg workup, MRI small acute lacunar infarcts. ASA only  . Depression   . Diastolic CHF, chronic (HCC)    grade I, most recent echo Jan 2019  . Fatty liver   . GERD (gastroesophageal reflux disease)   . Hyperlipidemia   . Hypertension   . Hyponatremia    multiple episodes with encephalopathy, renal thinks SIADH  . Hypothyroidism, unspecified   . Internal hemorrhoid   . NSTEMI (non-ST elevated myocardial infarction) (HCC) 08/28/2016  . Palpitations   . PFO with atrial septal aneurysm    TTE 2015  . Pneumonia 2016  . Severe pulmonary arterial systolic hypertension (HCC) 12/25/2017   per echo, on 1L oxgen via [place]    Social History   Socioeconomic History  . Marital status:  Widowed    Spouse name: Not on file  . Number of children: 7  . Years of education: Not on file  . Highest education level: Not on file  Occupational History  . Not on file  Social Needs  . Financial resource strain: Not on file  . Food insecurity:    Worry: Not on file    Inability: Not on file  . Transportation needs:    Medical: Not on file    Non-medical: Not on file  Tobacco Use  . Smoking status: Never Smoker  . Smokeless tobacco: Former Neurosurgeon    Types: Snuff  . Tobacco comment: "used snuff when I was 19"  Substance and Sexual Activity  . Alcohol use: No  . Drug use: No  . Sexual activity: Not Currently  Lifestyle  . Physical activity:    Days per week: Not on file    Minutes per session: Not on file  . Stress: Not on file  Relationships  . Social connections:    Talks on phone: Not on file    Gets together: Not on file    Attends religious service: Not on file    Active member of club or organization: Not on file    Attends meetings of clubs or organizations: Not on file    Relationship status: Not on file  . Intimate partner violence:    Fear of current or ex partner: Not on file    Emotionally abused: Not on file    Physically abused: Not on file    Forced sexual activity: Not on file  Other Topics Concern  . Not on file  Social History Narrative   ** Merged History Encounter **       Patient lives at home with her son           Family History  Problem Relation Age of Onset  . Stroke Father   . Hypertension Father   . Colon cancer Cousin   . Colon cancer Unknown        Aunt  . Diabetes Unknown        aunt and cousins  . Heart disease Unknown        cousins  . Healthy Daughter   . Healthy Son      Review of Systems: General: negative for chills, fever, night sweats or weight changes.  Cardiovascular: negative for chest pain, dyspnea on exertion, edema, orthopnea, palpitations, paroxysmal nocturnal dyspnea or shortness of  breath Dermatological: negative for rash Respiratory: negative for cough or wheezing Urologic: negative for hematuria Abdominal: negative for nausea, vomiting, diarrhea, bright red blood per rectum, melena, or hematemesis Neurologic:  negative for visual changes, syncope, or dizziness All other systems reviewed and are otherwise negative except as noted above.    Blood pressure (!) 136/58, pulse 65, height 5\' 2"  (1.575 m), weight 177 lb (80.3 kg).  General appearance: alert, cooperative, appears stated age, no distress and on O2, in a wheelchair Neck: no carotid bruit and no JVD Lungs: decreased breath sounds Heart: regular rate and rhythm Extremities: no edema Skin: Skin color, texture, turgor normal. No rashes or lesions Neurologic: Grossly normal  EKG NSR-65. TWI inferior and lateral leads-similar to past EKG Jan 2019  ASSESSMENT AND PLAN:   Chest pain, atypical Localized Rt chest pain, low risk Myoview 2014 (Dr Sharyn LullHarwani) . Not felt to be a candidate for further testing when admitted in Sept 2017 (Dr Elease HashimotoNahser).  Chronic diastolic CHF (congestive heart failure) (HCC) Echo Jan 2019 when patient was admitted with respiratory failure and hyponatremia showed EF 60-65% with grade 2 DD and severe pulmonary HTN (PA 72 mmHg)  Chronic respiratory failure with hypoxia (HCC) Followed by Dr Marchelle Gearingamaswamy- pt has severe pulmonary HTN and is on chronic O2.   Acute hyponatremia In the setting of laxative abuse- Jan 2019. She has been on chronic Demeclocycline for this as well as one salt tablet a day. Her last Na+ was 140  History of CVA (cerebrovascular accident) H/O CVA 2015  Severe pulmonary arterial systolic hypertension (HCC) PA pressure 72 mmHg  Abnormal EKG Chronic inferior-lateral TWI   PLAN  I discussed the diagnosis of chronic diastolic CHF and pulmonary hypertension with the patient and her son. They both agree that currently she is at baseline. I also explained that no further  work is indicated. She does [facility] coming out to weigh her and this has been stable. I did not change her current medications. I explained that though she carries a diagnosis of hyponatremia I think she could take a diuretic short term if needed. We discussed the importance of a 2-4 gm Na+ diet. I'll see her in 3 months an a day Dr Allyson SabalBerry is in the office and introduce him to the patient and her son.   Corine ShelterLuke Brendan Gadson PA-C 05/23/2018 1:24 PM

## 2018-05-23 NOTE — Patient Instructions (Signed)
Medication Instructions: Your physician recommends that you continue on your current medications as directed. Please refer to the Current Medication list given to you today.  If you need a refill on your cardiac medications before your next appointment, please call your pharmacy.   Follow-Up: Your physician wants you to follow-up in 3 months with Corine ShelterLuke Kilroy, PA.   Thank you for choosing [facility] at [facility]!!

## 2018-05-23 NOTE — Assessment & Plan Note (Signed)
Chronic inferior-lateral TWI

## 2018-05-23 NOTE — Assessment & Plan Note (Signed)
PA pressure 72 mmHg

## 2018-05-23 NOTE — Assessment & Plan Note (Signed)
Followed by Dr Marchelle Gearingamaswamy- pt has severe pulmonary HTN and is on chronic O2.

## 2018-05-23 NOTE — Assessment & Plan Note (Signed)
Localized Rt chest pain, low risk Myoview 2014 (Dr Sharyn LullHarwani) . Not felt to be a candidate for further testing when admitted in Sept 2017 (Dr Elease HashimotoNahser).

## 2018-05-23 NOTE — Assessment & Plan Note (Signed)
Echo Jan 2019 when patient was admitted with respiratory failure and hyponatremia showed EF 60-65% with grade 2 DD and severe pulmonary HTN (PA 72 mmHg)

## 2018-05-23 NOTE — Assessment & Plan Note (Signed)
H/O CVA 2015

## 2018-05-23 NOTE — Assessment & Plan Note (Addendum)
In the setting of laxative abuse- Jan 2019. She has been on chronic Demeclocycline for this as well as one salt tablet a day. Her last Na+ was 140

## 2018-05-29 ENCOUNTER — Telehealth: Payer: Self-pay | Admitting: Family Medicine

## 2018-05-29 ENCOUNTER — Other Ambulatory Visit: Payer: Self-pay

## 2018-05-29 ENCOUNTER — Ambulatory Visit (INDEPENDENT_AMBULATORY_CARE_PROVIDER_SITE_OTHER): Payer: Medicare Other | Admitting: Family Medicine

## 2018-05-29 ENCOUNTER — Encounter: Payer: Self-pay | Admitting: Family Medicine

## 2018-05-29 VITALS — BP 126/84 | HR 70 | Temp 98.5°F | Ht 62.0 in | Wt 175.0 lb

## 2018-05-29 DIAGNOSIS — I2721 Secondary pulmonary arterial hypertension: Secondary | ICD-10-CM | POA: Diagnosis not present

## 2018-05-29 DIAGNOSIS — E871 Hypo-osmolality and hyponatremia: Secondary | ICD-10-CM | POA: Diagnosis not present

## 2018-05-29 DIAGNOSIS — I5032 Chronic diastolic (congestive) heart failure: Secondary | ICD-10-CM | POA: Diagnosis not present

## 2018-05-29 DIAGNOSIS — L989 Disorder of the skin and subcutaneous tissue, unspecified: Secondary | ICD-10-CM | POA: Diagnosis not present

## 2018-05-29 DIAGNOSIS — J302 Other seasonal allergic rhinitis: Secondary | ICD-10-CM

## 2018-05-29 MED ORDER — FLUTICASONE PROPIONATE 50 MCG/ACT NA SUSP: 1.0000 | Freq: Two times a day (BID) | NASAL | 5 refills | Status: DC

## 2018-05-29 MED ORDER — AZELASTINE HCL 0.1 % NA SOLN: 1.0000 | Freq: Two times a day (BID) | NASAL | 5 refills | Status: DC

## 2018-05-29 NOTE — Progress Notes (Signed)
6/27/201910:32 AM  Tammy Fernandez 1923/12/11, 84 y.o. female [id number]  Chief Complaint  Patient presents with  . Follow-up    having upset stomach that comes and goes, could be for not taking the stomach medciation properly.   . Mass    has bump on the neck that causing pain     HPI:   Patient is a 84 y.o. female with past medical history significant for hyponatremia, CVA, chronic constipation, dCHF, CAD, pHTN on oxygen who presents today for followup  Saw cards 05/23/18 - no changes to regime Constipation continues to do well on senna She still has not heard from nephrology for an appt regarding her hyponatremia She continues to be on continues O2, son changed her [place] and they have noticed that it does not seem to fit too well and have had to increase O2 from 1L to 2L. She is not followed by pulm. She otherwise continues to have a runny nose and watery eyes despite flonase, zyrtec and patanol. Getting GI upset from PND. Left neck, has had a bump there for over a year, has not grown nor changed, not painful nor itchy  Fall Risk  05/29/2018 04/24/2018 04/10/2018 03/25/2018 03/12/2014  Falls in the past year? No No No No No  Risk for fall due to : - - - - Impaired balance/gait;Impaired mobility     Depression screen [facility] 2/9 05/29/2018 04/24/2018 04/10/2018  Decreased Interest 0 0 0  Down, Depressed, Hopeless 0 0 0  PHQ - 2 Score 0 0 0    Allergies  Allergen Reactions  . Apple Flavor Hives    No apple sauce  . Crestor [Rosuvastatin Calcium] Other (See Comments)    Muscle Aches  . Keflex [Cephalexin] Other (See Comments)    dizziness  . Peanut Butter Flavor Hives  . Sulfa Antibiotics Hives  . Cephalexin Other (See Comments)    dizziness  . Crestor [Rosuvastatin Calcium]   . Sulfa Drugs Cross Reactors Rash    Prior to Admission medications   Medication Sig Start Date End Date Taking? Authorizing Provider  albuterol (PROVENTIL) (2.5 MG/3ML) 0.083% nebulizer solution Take 3 mLs  (2.5 mg total) by nebulization every 6 (six) hours as needed for wheezing or shortness of breath. 11/01/15  Yes Tat, Onalee Hua, MD  ALPRAZolam Prudy Feeler) 1 MG tablet Take 0.5 tablets (0.5 mg total) by mouth 2 (two) times daily. 04/10/18  Yes Myles Lipps, MD  Ascorbic Acid (VITAMIN C PO) Take 1 tablet by mouth daily.   Yes [provider]  Aspirin 81 MG EC tablet Take 81 mg by mouth daily.   Yes [provider]  calcium carbonate (TUMS - DOSED IN MG ELEMENTAL CALCIUM) 500 MG chewable tablet Chew 1 tablet by mouth daily as needed for indigestion or heartburn.   Yes [provider]  cetirizine (ZYRTEC) 10 MG tablet Take 0.5 tablets (5 mg total) by mouth daily. 03/25/18  Yes Myles Lipps, MD  demeclocycline (DECLOMYCIN) 300 MG tablet Take 0.5 tablets (150 mg total) by mouth 2 (two) times daily. 04/24/18  Yes Myles Lipps, MD  fluticasone (FLONASE) 50 MCG/ACT nasal spray Place 1 spray into both nostrils 2 (two) times daily. 03/25/18  Yes Myles Lipps, MD  levothyroxine (SYNTHROID, LEVOTHROID) 50 MCG tablet Take 1 tablet (50 mcg total) by mouth daily before breakfast. 03/25/18  Yes Myles Lipps, MD  lisinopril (PRINIVIL,ZESTRIL) 5 MG tablet Take 1 tablet (5 mg total) by mouth daily. 03/25/18  Yes Myles LippsSantiago, Fergie Sherbert M, MD  meclizine (ANTIVERT) 12.5 MG tablet Take 1 tablet (12.5 mg total) by mouth daily as needed for dizziness. 05/06/18  Yes Myles LippsSantiago, Lymon Kidney M, MD  olopatadine (PATANOL) 0.1 % ophthalmic solution Place 1 drop into both eyes daily as needed for allergies. 03/25/18  Yes Myles LippsSantiago, Jayleene Glaeser M, MD  propranolol ER (INDERAL LA) 60 MG 24 hr capsule Take 1 capsule (60 mg total) by mouth daily. 03/25/18  Yes Myles LippsSantiago, Blakely Maranan M, MD    Past Medical History:  Diagnosis Date  . Anxiety   . Chronic lower back pain   . Constipation    h/o fecal disimpaction on 2017  . CVA (cerebral vascular accident) (HCC) 2015   neg workup, MRI small acute lacunar infarcts. ASA only  . Depression     . Diastolic CHF, chronic (HCC)    grade I, most recent echo Jan 2019  . Fatty liver   . GERD (gastroesophageal reflux disease)   . Hyperlipidemia   . Hypertension   . Hyponatremia    multiple episodes with encephalopathy, renal thinks SIADH  . Hypothyroidism, unspecified   . Internal hemorrhoid   . NSTEMI (non-ST elevated myocardial infarction) (HCC) 08/28/2016  . Palpitations   . PFO with atrial septal aneurysm    TTE 2015  . Pneumonia 2016  . Severe pulmonary arterial systolic hypertension (HCC) 12/25/2017   per echo, on 1L oxgen via [place]    Past Surgical History:  Procedure Laterality Date  . ABDOMINAL HYSTERECTOMY    . CATARACT EXTRACTION W/ INTRAOCULAR LENS  IMPLANT, BILATERAL Bilateral   . CESAREAN SECTION    . TEE WITHOUT CARDIOVERSION N/A 01/19/2014   Procedure: TRANSESOPHAGEAL ECHOCARDIOGRAM (TEE);  Surgeon: Laurey Moralealton S McLean, MD;  [facility];  Service: Cardiovascular;  Laterality: N/A;  [name]/ja  . VAGINAL HYSTERECTOMY      Social History   Tobacco Use  . Smoking status: Never Smoker  . Smokeless tobacco: Former NeurosurgeonUser    Types: Snuff  . Tobacco comment: "used snuff when I was 19"  Substance Use Topics  . Alcohol use: No    Family History  Problem Relation Age of Onset  . Stroke Father   . Hypertension Father   . Colon cancer Cousin   . Colon cancer Unknown        Aunt  . Diabetes Unknown        aunt and cousins  . Heart disease Unknown        cousins  . Healthy Daughter   . Healthy Son     Review of Systems  Constitutional: Negative for chills and fever.  HENT: Positive for congestion.   Respiratory: Negative for cough and shortness of breath.   Cardiovascular: Negative for chest pain, palpitations and leg swelling.  Gastrointestinal: Positive for nausea. Negative for abdominal pain and vomiting.    OBJECTIVE:  Blood pressure 126/84, pulse 70, temperature 98.5 F (36.9 C), temperature source Oral, height 5\' 2"  (1.575 m), weight 175  lb (79.4 kg), SpO2 98 %.  Wt Readings from Last 3 Encounters:  05/29/18 175 lb (79.4 kg)  05/23/18 177 lb (80.3 kg)  04/24/18 175 lb (79.4 kg)    Physical Exam  Constitutional: She is oriented to person, place, and time. She appears well-developed and well-nourished.  In wheelchair, using her oxygen  HENT:  Head: Normocephalic and atraumatic.  Nose: Mucosal edema present.  Mouth/Throat: Oropharynx is clear and moist. No oropharyngeal exudate.  Eyes: Pupils are equal, round, and  reactive to light. EOM are normal. No scleral icterus.  Neck: Neck supple.  Cardiovascular: Normal rate, regular rhythm and normal heart sounds. Exam reveals no gallop and no friction rub.  No murmur heard. Pulmonary/Chest: Effort normal and breath sounds normal. She has no wheezes. She has no rales.  Musculoskeletal: She exhibits no edema.  Neurological: She is alert and oriented to person, place, and time.  Skin: Skin is warm and dry.  Left neck a hyperpigmented fleshy large papule.   Psychiatric: She has a normal mood and affect.  Nursing note and vitals reviewed.   ASSESSMENT and PLAN  1. Hyponatremia We were able to help patient coordinate care and schedule appt with renal. Otherwise sodium has been stable on current regime. Checking sodium today. - Basic Metabolic Panel  2. Seasonal allergies Uncontrolled. Dc zytrec, start azelastine, cont with flonase and patanol.  3. Severe pulmonary arterial systolic hypertension (HCC) Stable. Continue 24/7 oxygen, advised seek proper fitting [place] from oxygen company  4. Chronic diastolic CHF (congestive heart failure) (HCC) Stable. Recently seen by cards. Cont current regime.  5. Skin lesion of neck Benign, reassured patient.  Other orders - azelastine (ASTELIN) 0.1 % nasal spray; Place 1 spray into both nostrils 2 (two) times daily. Use in each nostril as directed - fluticasone (FLONASE) 50 MCG/ACT nasal spray; Place 1 spray into both nostrils 2 (two)  times daily.  Return in about 1 month (around 06/26/2018).    Myles Lipps, MD Primary Care at [facility] [address] [place], [place] [address] Ph.  [phone] Fax [phone]

## 2018-05-29 NOTE — Patient Instructions (Addendum)
  1. Monday August 5th at 2:30pm with [facility].    IF you received an x-ray today, you will receive an invoice from [facility]. Please contact [facility] at [phone] with questions or concerns regarding your invoice.   IF you received labwork today, you will receive an invoice from [facility]. Please contact [facility] at [phone] with questions or concerns regarding your invoice.   Our billing staff will not be able to assist you with questions regarding bills from these companies.  You will be contacted with the lab results as soon as they are available. The fastest way to get your results is to activate your My Chart account. Instructions are located on the last page of this paperwork. If you have not heard from us regarding the results in 2 weeks, please contact this office.        IF you received an x-ray today, you will receive an invoice from [facility]. Please contact [facility] at [phone] with questions or concerns regarding your invoice.   IF you received labwork today, you will receive an invoice from [facility]. Please contact [facility] at [phone] with questions or concerns regarding your invoice.   Our billing staff will not be able to assist you with questions regarding bills from these companies.  You will be contacted with the lab results as soon as they are available. The fastest way to get your results is to activate your My Chart account. Instructions are located on the last page of this paperwork. If you have not heard from us regarding the results in 2 weeks, please contact this office.

## 2018-05-29 NOTE — Telephone Encounter (Signed)
Tried calling pt's home phone number but mailbox was full. Called pt's cell number on DPR and left vm with appt info for [facility] appt on 07/07/18 at 2:30pm. Pt advised on vm that [facility] will send paperwork that needs to be filled out and brought to appt, along with photo ID, insurance, and medications. Pt also advised in vm that appt can be rescheduled as long as up to 24 hours notice for their office, and address and phone number given.

## 2018-05-30 LAB — BASIC METABOLIC PANEL
BUN/Creatinine Ratio: 21 (ref 12–28)
BUN: 15 mg/dL (ref 10–36)
CO2: 37 mmol/L (ref 20–29)
Calcium: 9.7 mg/dL (ref 8.7–10.3)
Chloride: 91 mmol/L — ABNORMAL LOW (ref 96–106)
Creatinine, Ser: 0.72 mg/dL (ref 0.57–1.00)
GFR calc Af Amer: 85 mL/min/{1.73_m2} (ref >59)
GFR calc non Af Amer: 73 mL/min/{1.73_m2} (ref >59)
Glucose: 121 mg/dL — ABNORMAL HIGH (ref 65–99)
Potassium: 4.6 mmol/L (ref 3.5–5.2)
Sodium: 139 mmol/L (ref 134–144)

## 2018-05-31 DIAGNOSIS — I38 Endocarditis, valve unspecified: Secondary | ICD-10-CM | POA: Diagnosis not present

## 2018-05-31 DIAGNOSIS — J9601 Acute respiratory failure with hypoxia: Secondary | ICD-10-CM | POA: Diagnosis not present

## 2018-06-09 ENCOUNTER — Ambulatory Visit: Payer: Self-pay | Admitting: Family Medicine

## 2018-06-09 NOTE — Telephone Encounter (Signed)
Pt c/o soreness inside vagina. Pt states that she has had same sx before and she has tried Monistat and/or Diflucan and it helped. Care advice given. Encouraged to mention this to provider tomorrow. Son called to see what pt can do until appointment tomorrow to help decrease pt's O2 level go back to 1.5 LPM. She is having a occasional cough. Discussed breathing exercises and encourage pt to cough up secretions. Encourage to have pt sit out of bed and try and cough to open airways.  Pt's son is wanting to have PT and Nurse to come to the home to aid in increase her activity and to have PT help with walking. Pt has appt tomorrow.  Reason for Disposition . All other vaginal symptoms  (Exception: feels like prior yeast infection, minor abrasion, mild rash < 24 hour duration, mild itching)  Answer Assessment - Initial Assessment Questions 1. SYMPTOM: "What's the main symptom you're concerned about?" (e.g., pain, itching, dryness)     soreness 2. LOCATION: "Where is the   located?" (e.g., inside/outside, left/right)     inside 3. ONSET: "When did the  soreness  start?"     Last week 4. PAIN: "Is there any pain?" If so, ask: "How bad is it?" (Scale: 1-10; mild, moderate, severe)     soreness 5. ITCHING: "Is there any itching?" If so, ask: "How bad is it?" (Scale: 1-10; mild, moderate, severe)     no 6. CAUSE: "What do you think is causing the discharge?" "Have you had the same problem before? What happened then?"     Yeast infection- yes-monistat and diflucan 7. OTHER SYMPTOMS: "Do you have any other symptoms?" (e.g., fever, itching, vaginal bleeding, pain with urination, injury to genital area, vaginal foreign body)     no 8. PREGNANCY: "Is there any chance you are pregnant?" "When was your last menstrual period?"     n/a  Protocols used: VAGINAL [id number]

## 2018-06-10 ENCOUNTER — Encounter: Payer: Self-pay | Admitting: Family Medicine

## 2018-06-10 ENCOUNTER — Other Ambulatory Visit: Payer: Self-pay

## 2018-06-10 ENCOUNTER — Ambulatory Visit (INDEPENDENT_AMBULATORY_CARE_PROVIDER_SITE_OTHER): Payer: Medicare Other

## 2018-06-10 ENCOUNTER — Ambulatory Visit (INDEPENDENT_AMBULATORY_CARE_PROVIDER_SITE_OTHER): Payer: Medicare Other | Admitting: Family Medicine

## 2018-06-10 VITALS — BP 128/88 | HR 70 | Temp 98.8°F | Wt 177.0 lb

## 2018-06-10 DIAGNOSIS — R05 Cough: Secondary | ICD-10-CM

## 2018-06-10 DIAGNOSIS — B379 Candidiasis, unspecified: Secondary | ICD-10-CM

## 2018-06-10 DIAGNOSIS — I2721 Secondary pulmonary arterial hypertension: Secondary | ICD-10-CM

## 2018-06-10 DIAGNOSIS — I5032 Chronic diastolic (congestive) heart failure: Secondary | ICD-10-CM

## 2018-06-10 DIAGNOSIS — R5381 Other malaise: Secondary | ICD-10-CM

## 2018-06-10 DIAGNOSIS — Z9981 Dependence on supplemental oxygen: Secondary | ICD-10-CM | POA: Insufficient documentation

## 2018-06-10 DIAGNOSIS — J9611 Chronic respiratory failure with hypoxia: Secondary | ICD-10-CM

## 2018-06-10 DIAGNOSIS — R059 Cough, unspecified: Secondary | ICD-10-CM

## 2018-06-10 MED ORDER — FLUCONAZOLE 150 MG PO TABS: 150.0000 mg | ORAL_TABLET | Freq: Once | ORAL | 1 refills | Status: DC

## 2018-06-10 NOTE — Progress Notes (Signed)
7/9/201912:30 PM  Tammy [name] 12-18-1923, 84 y.o. female [id number]  Chief Complaint  Patient presents with  . Follow-up    oxygen level was fixed recently , had a leak in it. Now oxygen is flowing well. Having trouble keep canula in the nose while sleeping.  . Cough    productive cough    HPI:   Patient is a 84 y.o. female with past medical history significant for hyponatremia, CVA, chronic constipation, dCHF, CAD, pHTN on oxygen who presents today for followup  Had oxygen leak, O2 down to 60s, very SOB, fixed on Sunday  Having problems with keeping nasal cannula in place while sleeping This am O2 in low 80s, was not wearing [place] Normally needs 1L, up to 3L not because of SOB but to brings O2 back up from this AM  Productive cough for about 4 days, reports clear phlegm, stuck in throat Has not started new nasal sprays, misplaced them mucinex helps No fever or chills Continues with upset stomach She is also requesting diflucan, reports mild yeast infection, wears adult diapers, mild incontinence exacerbated by mobility issues Son and patient requesting home health services to help with monitoring of oxygen therapy and aiding in logistics with wheelchair, oxygen and strength training as getting weaker and harder to assist with transfers, toileting.   Fall Risk  06/10/2018 05/29/2018 04/24/2018 04/10/2018 03/25/2018  Falls in the past year? No No No No No  Risk for fall due to : - - - - -     [facility] 2/9 06/10/2018 05/29/2018 04/24/2018  Decreased Interest 0 0 0  Down, Depressed, Hopeless 0 0 0  PHQ - 2 Score 0 0 0    Allergies  Allergen Reactions  . Apple Flavor Hives    No apple sauce  . Crestor [Rosuvastatin Calcium] Other (See Comments)    Muscle Aches  . Keflex [Cephalexin] Other (See Comments)    dizziness  . Peanut Butter Flavor Hives  . Sulfa Antibiotics Hives  . Cephalexin Other (See Comments)    dizziness  . Crestor [Rosuvastatin Calcium]   . Sulfa  Drugs Cross Reactors Rash    Prior to Admission medications   Medication Sig Start Date End Date Taking? Authorizing Provider  albuterol (PROVENTIL) (2.5 MG/3ML) 0.083% nebulizer solution Take 3 mLs (2.5 mg total) by nebulization every 6 (six) hours as needed for wheezing or shortness of breath. 11/01/15  Yes Tat, Onalee Huaavid, MD  ALPRAZolam Prudy Feeler(XANAX) 1 MG tablet Take 0.5 tablets (0.5 mg total) by mouth 2 (two) times daily. 04/10/18  Yes Myles LippsSantiago, Aaryanna Hyden M, MD  Ascorbic Acid (VITAMIN C PO) Take 1 tablet by mouth daily.   Yes [provider]  Aspirin 81 MG EC tablet Take 81 mg by mouth daily.   Yes [provider]  azelastine (ASTELIN) 0.1 % nasal spray Place 1 spray into both nostrils 2 (two) times daily. Use in each nostril as directed 05/29/18  Yes Myles LippsSantiago, Larhonda Dettloff M, MD  calcium carbonate (TUMS - DOSED IN MG ELEMENTAL CALCIUM) 500 MG chewable tablet Chew 1 tablet by mouth daily as needed for indigestion or heartburn.   Yes [provider]  demeclocycline (DECLOMYCIN) 300 MG tablet Take 0.5 tablets (150 mg total) by mouth 2 (two) times daily. 04/24/18  Yes Myles LippsSantiago, Tyrihanna Wingert M, MD  fluticasone (FLONASE) 50 MCG/ACT nasal spray Place 1 spray into both nostrils 2 (two) times daily. 05/29/18  Yes Myles LippsSantiago, Kian Gamarra M, MD  levothyroxine (SYNTHROID, LEVOTHROID) 50 MCG tablet Take  1 tablet (50 mcg total) by mouth daily before breakfast. 03/25/18  Yes Myles Lipps, MD  lisinopril (PRINIVIL,ZESTRIL) 5 MG tablet Take 1 tablet (5 mg total) by mouth daily. 03/25/18  Yes Myles Lipps, MD  meclizine (ANTIVERT) 12.5 MG tablet Take 1 tablet (12.5 mg total) by mouth daily as needed for dizziness. 05/06/18  Yes Myles Lipps, MD  olopatadine (PATANOL) 0.1 % ophthalmic solution Place 1 drop into both eyes daily as needed for allergies. 03/25/18  Yes Myles Lipps, MD  propranolol ER (INDERAL LA) 60 MG 24 hr capsule Take 1 capsule (60 mg total) by mouth daily. 03/25/18  Yes Myles Lipps, MD    Past  Medical History:  Diagnosis Date  . Anxiety   . Chronic lower back pain   . Constipation    h/o fecal disimpaction on 2017  . CVA (cerebral vascular accident) (HCC) 2015   neg workup, MRI small acute lacunar infarcts. ASA only  . Depression   . Diastolic CHF, chronic (HCC)    grade I, most recent echo Jan 2019  . Fatty liver   . GERD (gastroesophageal reflux disease)   . Hyperlipidemia   . Hypertension   . Hyponatremia    multiple episodes with encephalopathy, renal thinks SIADH  . Hypothyroidism, unspecified   . Internal hemorrhoid   . NSTEMI (non-ST elevated myocardial infarction) (HCC) 08/28/2016  . Palpitations   . PFO with atrial septal aneurysm    TTE 2015  . Pneumonia 2016  . Severe pulmonary arterial systolic hypertension (HCC) 12/25/2017   per echo, on 1L oxgen via [place]    Past Surgical History:  Procedure Laterality Date  . ABDOMINAL HYSTERECTOMY    . CATARACT EXTRACTION W/ INTRAOCULAR LENS  IMPLANT, BILATERAL Bilateral   . CESAREAN SECTION    . TEE WITHOUT CARDIOVERSION N/A 01/19/2014   Procedure: TRANSESOPHAGEAL ECHOCARDIOGRAM (TEE);  Surgeon: Laurey Morale, MD;  Location: [facility] ENDOSCOPY;  Service: Cardiovascular;  Laterality: N/A;  [name]  . VAGINAL HYSTERECTOMY      Social History   Tobacco Use  . Smoking status: Never Smoker  . Smokeless tobacco: Former Neurosurgeon    Types: Snuff  . Tobacco comment: "used snuff when I was 19"  Substance Use Topics  . Alcohol use: No    Family History  Problem Relation Age of Onset  . Stroke Father   . Hypertension Father   . Colon cancer Cousin   . Colon cancer Unknown        Aunt  . Diabetes Unknown        aunt and cousins  . Heart disease Unknown        cousins  . Healthy Daughter   . Healthy Son     Review of Systems  Constitutional: Negative for chills, fever and malaise/fatigue.  HENT: Positive for congestion.   Respiratory: Positive for cough and sputum production. Negative for shortness of breath.     Cardiovascular: Negative for chest pain, palpitations and leg swelling.  Gastrointestinal: Positive for nausea. Negative for abdominal pain and vomiting.     OBJECTIVE:  Blood pressure 128/88, pulse 70, temperature 98.8 F (37.1 C), temperature source Oral, weight 177 lb (80.3 kg), SpO2 99 %. On 3L O2 via   Wt Readings from Last 3 Encounters:  06/10/18 177 lb (80.3 kg)  05/29/18 175 lb (79.4 kg)  05/23/18 177 lb (80.3 kg)    Physical Exam  Constitutional: She is oriented to person, place, and time.  She appears well-developed and well-nourished.  In wheelchair, using her oxygen  HENT:  Head: Normocephalic and atraumatic.  Nose: Mucosal edema present.  Mouth/Throat: Oropharynx is clear and moist. No oropharyngeal exudate.  Eyes: Pupils are equal, round, and reactive to light. EOM are normal. No scleral icterus.  Neck: Neck supple.  Cardiovascular: Normal rate, regular rhythm and normal heart sounds. Exam reveals no gallop and no friction rub.  No murmur heard. Pulmonary/Chest: Effort normal. She has decreased breath sounds in the right lower field. She has no wheezes. She has no rhonchi. She has no rales.  Musculoskeletal: She exhibits no edema.  Neurological: She is alert and oriented to person, place, and time.  Skin: Skin is warm and dry.  Left neck a hyperpigmented fleshy large papule.   Psychiatric: She has a normal mood and affect.  Nursing note and vitals reviewed.   Dg Chest 2 View  Result Date: 06/10/2018 CLINICAL DATA:  Productive cough.  Hypoxia. EXAM: CHEST - 2 VIEW COMPARISON:  12/28/2017 and CT scan of 12/21/2017 FINDINGS: Elevated right hemidiaphragm with right basilar atelectasis. This primarily involves the lower lobe. Subsegmental atelectasis or scarring at the left lung base. The patient is rotated to the right on today's radiograph, reducing diagnostic sensitivity and specificity. Atherosclerotic calcification of the aortic arch. No appreciable pleural  effusion. IMPRESSION: 1. Chronic prominent elevation of the right hemidiaphragm. Atelectasis at the right lung base primarily in the right lower lobe. 2. Scarring or mild atelectasis at the left lung base. 3.  Aortic Atherosclerosis (ICD10-I70.0). Electronically Signed   By: Gaylyn Rong M.D.   On: 06/10/2018 13:12     ASSESSMENT and PLAN  1. Cough 2. Chronic diastolic CHF (congestive heart failure) (HCC) 3. Severe pulmonary arterial systolic hypertension (HCC) 4. Chronic respiratory failure with hypoxia (HCC) 6. Physical deconditioning 7. Oxygen dependent Chronic cough, AF, lungs clear, CXR stable. No evidence of CHF on exam today. Discussed management of PND. Discussed weaning down of oxygen as previously doing well on 1L. Referring to home health for requested services and to pulm for further eval and treatment, as patient and family concerned about current situation.  - DG Chest 2 View; Future - Ambulatory referral to Home Health - Ambulatory referral to Pulmonology  5. Yeast infection - fluconazole (DIFLUCAN) 150 MG tablet; Take 1 tablet (150 mg total) by mouth once for 1 dose.  Return in about 2 months (around 08/11/2018).    Myles Lipps, MD Primary Care at [facility] [address] [place], [place] [address] Ph.  [phone] Fax [phone]

## 2018-06-10 NOTE — Patient Instructions (Signed)
     IF you received an x-ray today, you will receive an invoice from [facility]. Please contact [facility] at [phone] with questions or concerns regarding your invoice.   IF you received labwork today, you will receive an invoice from [facility]. Please contact [facility] at [phone] with questions or concerns regarding your invoice.   Our billing staff will not be able to assist you with questions regarding bills from these companies.  You will be contacted with the lab results as soon as they are available. The fastest way to get your results is to activate your My Chart account. Instructions are located on the last page of this paperwork. If you have not heard from us regarding the results in 2 weeks, please contact this office.     

## 2018-06-11 ENCOUNTER — Encounter: Payer: Self-pay | Admitting: Family Medicine

## 2018-06-14 ENCOUNTER — Telehealth: Payer: Self-pay | Admitting: Family Medicine

## 2018-06-16 ENCOUNTER — Telehealth: Payer: Self-pay | Admitting: Family Medicine

## 2018-06-16 NOTE — Telephone Encounter (Signed)
meclizine refill Last Refill:05/06/18 # 30 Last OV: 03/25/18 PCP: Dr Leretha PolSantiago Pharmacy:[facility] [address]

## 2018-06-16 NOTE — Telephone Encounter (Signed)
Please process recent home health referral accordingly to [facility] per patient request, thanks

## 2018-06-16 NOTE — Telephone Encounter (Signed)
Copied from CRM [phone]. Topic: Inquiry >> Jun 16, 2018 12:56 PM Yvonna Alanisobinson, Andra M wrote: Reason for CRM: Patient's son Onalee HuaDavid called stating that she needs a PT order to be sent to [facility].

## 2018-06-19 ENCOUNTER — Other Ambulatory Visit: Payer: Self-pay | Admitting: Family Medicine

## 2018-06-20 DIAGNOSIS — J9601 Acute respiratory failure with hypoxia: Secondary | ICD-10-CM | POA: Diagnosis not present

## 2018-06-20 DIAGNOSIS — I38 Endocarditis, valve unspecified: Secondary | ICD-10-CM | POA: Diagnosis not present

## 2018-06-23 ENCOUNTER — Telehealth: Payer: Self-pay | Admitting: Family Medicine

## 2018-06-23 ENCOUNTER — Other Ambulatory Visit: Payer: Self-pay | Admitting: Family Medicine

## 2018-06-23 NOTE — Telephone Encounter (Unsigned)
Copied from CRM [id number]. Topic: General - Other >> Jun 23, 2018  9:54 AM Marylen PontoMcneil, Ja-Kwan wrote: Reason for CRM: Melissa with [facility] request PT only as the patient already has oxygen with [facility]. Melissa stated that they are unable to staff nursing but would need approval for PT only. Cb# [phone]

## 2018-06-24 NOTE — Telephone Encounter (Signed)
The pharmacy never received the script which was sent on 07/18/219 for meclizine. Please resend this.

## 2018-06-24 NOTE — Telephone Encounter (Signed)
Patient's sone Tammy Fernandez calling to see if Dr. Leretha PolSantiago approved verbal order for physical therapy with [facility] yet?

## 2018-06-25 ENCOUNTER — Encounter (HOSPITAL_COMMUNITY): Payer: Self-pay

## 2018-06-25 ENCOUNTER — Emergency Department (HOSPITAL_COMMUNITY)
Admission: EM | Admit: 2018-06-25 | Discharge: 2018-06-25 | Disposition: A | Discharge status: Home / Self Care | Payer: Medicare Other | Attending: Emergency Medicine | Admitting: Emergency Medicine

## 2018-06-25 ENCOUNTER — Other Ambulatory Visit: Payer: Self-pay

## 2018-06-25 DIAGNOSIS — E039 Hypothyroidism, unspecified: Secondary | ICD-10-CM | POA: Insufficient documentation

## 2018-06-25 DIAGNOSIS — I5032 Chronic diastolic (congestive) heart failure: Secondary | ICD-10-CM | POA: Diagnosis not present

## 2018-06-25 DIAGNOSIS — I110 Hypertensive heart disease with heart failure: Secondary | ICD-10-CM | POA: Diagnosis not present

## 2018-06-25 DIAGNOSIS — I159 Secondary hypertension, unspecified: Secondary | ICD-10-CM

## 2018-06-25 DIAGNOSIS — R42 Dizziness and giddiness: Secondary | ICD-10-CM | POA: Insufficient documentation

## 2018-06-25 LAB — BASIC METABOLIC PANEL
Anion gap: 10 (ref 5–15)
BUN: 12 mg/dL (ref 8–23)
CHLORIDE: 88 mmol/L — AB (ref 98–111)
CO2: 34 mmol/L — AB (ref 22–32)
CREATININE: 0.93 mg/dL (ref 0.44–1.00)
Calcium: 9.7 mg/dL (ref 8.9–10.3)
GFR calc Af Amer: >60 mL/min (ref >60)
GFR calc non Af Amer: 52 mL/min — ABNORMAL LOW (ref >60)
GLUCOSE: 119 mg/dL — AB (ref 70–99)
POTASSIUM: 4.5 mmol/L (ref 3.5–5.1)
Sodium: 132 mmol/L — ABNORMAL LOW (ref 135–145)

## 2018-06-25 LAB — URINALYSIS, ROUTINE W REFLEX MICROSCOPIC
BACTERIA UA: NONE SEEN
Bilirubin Urine: NEGATIVE
Glucose, UA: NEGATIVE mg/dL
Hgb urine dipstick: NEGATIVE
Ketones, ur: NEGATIVE mg/dL
Nitrite: NEGATIVE
PH: 7 (ref 5.0–8.0)
PROTEIN: NEGATIVE mg/dL
SPECIFIC GRAVITY, URINE: 1.006 (ref 1.005–1.030)

## 2018-06-25 LAB — CBC
HEMATOCRIT: 41.1 % (ref 36.0–46.0)
Hemoglobin: 12.9 g/dL (ref 12.0–15.0)
MCH: 29.8 pg (ref 26.0–34.0)
MCHC: 31.4 g/dL (ref 30.0–36.0)
MCV: 94.9 fL (ref 78.0–100.0)
Platelets: 264 10*3/uL (ref 150–400)
RBC: 4.33 MIL/uL (ref 3.87–5.11)
RDW: 12.9 % (ref 11.5–15.5)
WBC: 6.5 10*3/uL (ref 4.0–10.5)

## 2018-06-25 LAB — CBG MONITORING, ED: Glucose-Capillary: 110 mg/dL — ABNORMAL HIGH (ref 70–99)

## 2018-06-25 MED ORDER — LISINOPRIL 5 MG PO TABS: 10.0000 mg | ORAL_TABLET | Freq: Every day | ORAL | 5 refills | Status: DC

## 2018-06-25 MED ORDER — HYDRALAZINE HCL 20 MG/ML IJ SOLN
5.0000 mg | Freq: Once | INTRAMUSCULAR | Status: AC
  Administered 2018-06-25: 5 mg via INTRAVENOUS
  Filled 2018-06-25: qty 1

## 2018-06-25 NOTE — ED Notes (Signed)
Pt has not urinated, given water per ED MD

## 2018-06-25 NOTE — Telephone Encounter (Signed)
Please Advise

## 2018-06-25 NOTE — ED Provider Notes (Signed)
Emergency Department Provider Note   I have reviewed the triage vital signs and the nursing notes.   HISTORY  Chief Complaint Dizziness   HPI Tammy Fernandez is a 84 y.o. female with multiple medical problems documented below the presents to the emergency department with dizziness.  Patient states that this happens often when her blood pressure is high.  She states that started last night.  Some of the history is given by the son who lives with her and states that she also has a history of anxiety but usually when her blood pressure gets high she does feel a bit lightheaded and off-balance, like she is right now.  She did have some elevated blood pressures up at night and then when EMS picked her up this morning her initial blood was over 220 systolic.  Patient states has been compliant with her medications.  She does not have any shortness of breath, wheezing, fever, cough, urinary changes or changes in her bowel movements.  She states compliance with her fluid intake with the bottle and a half a day.  Been eating normally otherwise.  No fevers.  No trauma or falls. No other associated or modifying symptoms.    Past Medical History:  Diagnosis Date  . Anxiety   . Chronic lower back pain   . Constipation    h/o fecal disimpaction on 2017  . CVA (cerebral vascular accident) (HCC) 2015   neg workup, MRI small acute lacunar infarcts. ASA only  . Depression   . Diastolic CHF, chronic (HCC)    grade I, most recent echo Jan 2019  . Fatty liver   . GERD (gastroesophageal reflux disease)   . Hyperlipidemia   . Hypertension   . Hyponatremia    multiple episodes with encephalopathy, renal thinks SIADH  . Hypothyroidism, unspecified   . Internal hemorrhoid   . NSTEMI (non-ST elevated myocardial infarction) (HCC) 08/28/2016  . Palpitations   . PFO with atrial septal aneurysm    TTE 2015  . Pneumonia 2016  . Severe pulmonary arterial systolic hypertension (HCC) 12/25/2017   per echo,  on 1L oxgen via [place]    Patient Active Problem List   Diagnosis Date Noted  . Oxygen dependent 06/10/2018  . Chest pain, atypical 05/23/2018  . Chronic respiratory failure with hypoxia (HCC) 05/23/2018  . Long term prescription benzodiazepine use 04/23/2018  . Hyperlipidemia   . Chronic diastolic CHF (congestive heart failure) (HCC) 12/25/2017  . Severe pulmonary arterial systolic hypertension (HCC) 12/25/2017  . Essential hypertension 08/29/2016  . Hypothyroidism 08/29/2016  . Abnormal EKG   . Elevated troponin   . NSTEMI (non-ST elevated myocardial infarction) (HCC) 08/28/2016  . Decubitus ulcer of sacral region, stage 3 (HCC) 12/18/2015  . Klebsiella cystitis 12/16/2015  . Acute delirium 12/16/2015  . Aspiration pneumonia (HCC) 12/16/2015  . Leukocytosis   . Hypokalemia   . Encounter for central line placement   . Anxiety state   . Acute on chronic respiratory failure with hypoxia (HCC)   . Essential hypertension   . Physical deconditioning   . Altered mental status   . Hypothyroidism   . Chronic diarrhea   . CAP (community acquired pneumonia) 10/29/2015  . History of CVA (cerebrovascular accident) 01/19/2014  . Dysphagia, post-stroke 01/15/2014  . Bacteremia due to Staphylococcus 01/13/2014  . Acute respiratory failure with hypoxia (HCC) 01/13/2014  . Left hemiplegia (HCC) 01/12/2014  . Hypertension 01/11/2014  . Acute hyponatremia 01/11/2014  . Hyperkalemia 01/11/2014  . Hematuria  01/11/2014  . E. coli UTI (urinary tract infection) 01/11/2014  . GERD (gastroesophageal reflux disease) 11/14/2011  . Obesities, morbid (HCC) 11/14/2011    Past Surgical History:  Procedure Laterality Date  . ABDOMINAL HYSTERECTOMY    . CATARACT EXTRACTION W/ INTRAOCULAR LENS  IMPLANT, BILATERAL Bilateral   . CESAREAN SECTION    . TEE WITHOUT CARDIOVERSION N/A 01/19/2014   Procedure: TRANSESOPHAGEAL ECHOCARDIOGRAM (TEE);  Surgeon: Laurey Moralealton S McLean, MD;  [facility];  Service:  Cardiovascular;  Laterality: N/A;  [name]/ja  . VAGINAL HYSTERECTOMY      Current Outpatient Rx  . Order #: [id number] Class: Normal  . Order #: [id number] Class: Print  . Order #: [id number] Class: Historical Med  . Order #: [id number] Class: Historical Med  . Order #: [id number] Class: Normal  . Order #: [id number] Class: Normal  . Order #: [id number] Class: Normal  . Order #: [id number] Class: Normal  . Order #: [id number] Class: Normal  . Order #: [id number] Class: Normal  . Order #: [id number] Class: Normal    Allergies Apple flavor; Crestor [rosuvastatin calcium]; Keflex [cephalexin]; Peanut butter flavor; Sulfa antibiotics; Cephalexin; Crestor [rosuvastatin calcium]; and Sulfa drugs cross reactors  Family History  Problem Relation Age of Onset  . Stroke Father   . Hypertension Father   . Colon cancer Cousin   . Colon cancer Unknown        Aunt  . Diabetes Unknown        aunt and cousins  . Heart disease Unknown        cousins  . Healthy Daughter   . Healthy Son     Social History Social History   Tobacco Use  . Smoking status: Never Smoker  . Smokeless tobacco: Former NeurosurgeonUser    Types: Snuff  . Tobacco comment: "used snuff when I was 19"  Substance Use Topics  . Alcohol use: No  . Drug use: No    Review of Systems  All other systems negative except as documented in the HPI. All pertinent positives and negatives as reviewed in the HPI. ____________________________________________   PHYSICAL EXAM:  VITAL SIGNS: ED Triage Vitals  Enc Vitals Group     BP 06/25/18 0836 (!) 195/82     Pulse Rate 06/25/18 0836 81     Resp 06/25/18 0836 17     Temp 06/25/18 0836 97.8 F (36.6 C)     Temp Source 06/25/18 0836 Oral     SpO2 06/25/18 0835 100 %     Weight 06/25/18 0844 177 lb (80.3 kg)     Height 06/25/18 0844 5\' 2"  (1.575 m)    Constitutional: Alert and oriented. Well appearing and in no acute distress. Eyes: Conjunctivae are normal. PERRL. EOMI. Head: Atraumatic. Nose:  No congestion/rhinnorhea. Mouth/Throat: Mucous membranes are moist.  Oropharynx non-erythematous. Neck: No stridor.  No meningeal signs.   Cardiovascular: Normal rate, regular rhythm. Good peripheral circulation. Grossly normal heart sounds.   Respiratory: on oxygen, normal respiratory effort.  No retractions. Lungs with diffuse crackles, diminished in bases (poor effort and body habitus may be affecting this). Gastrointestinal: Soft and nontender. No distention.  Musculoskeletal: No lower extremity tenderness nor edema. No gross deformities of extremities. Neurologic:  Normal speech and language. No gross focal neurologic deficits are appreciated. No altered mental status, able to give full seemingly accurate history.  Face is symmetric, EOM's intact, pupils equal and reactive, vision intact, tongue and uvula midline without deviation. Upper and Lower extremity motor 5/5, intact pain perception in distal extremities, 2+ reflexes in biceps,  patella and achilles tendons. Able to perform finger to nose normal with both hands. Walks without assistance or evident ataxia.  Skin:  Skin is warm, dry and intact. No rash noted.   ____________________________________________   LABS (all labs ordered are listed, but only abnormal results are displayed)  Labs Reviewed  BASIC METABOLIC PANEL - Abnormal; Notable for the following components:      Result Value   Sodium 132 (*)    Chloride 88 (*)    CO2 34 (*)    Glucose, Bld 119 (*)    GFR calc non Af Amer 52 (*)    All other components within normal limits  URINALYSIS, ROUTINE W REFLEX MICROSCOPIC - Abnormal; Notable for the following components:   Color, Urine STRAW (*)    Leukocytes, UA TRACE (*)    All other components within normal limits  CBG MONITORING, ED - Abnormal; Notable for the following components:   Glucose-Capillary 110 (*)    All other components within normal limits  CBC   ____________________________________________  EKG    EKG Interpretation  Date/Time:  Wednesday June 25 2018 08:49:51 EDT Ventricular Rate:  72 PR Interval:    QRS Duration: 87 QT Interval:  391 QTC Calculation: 428 R Axis:   45 Text Interpretation:  Sinus rhythm Borderline repolarization abnormality last one is uninterpreable but appears largely unchanged Confirmed by Marily Memos [phone]) on 06/25/2018 10:35:16 AM       ____________________________________________   INITIAL IMPRESSION / ASSESSMENT AND PLAN / ED COURSE  Patient with significantly elevated blood pressure and apparently gets like this with that.  She is has improvement symptoms here as her blood pressure is improving in the 160s to 170s.  Will check to make sure she has any heart failure with the crackles and history of the same with elevated blood pressures.  No focal deficits to suggest stroke at this time however blood pressure remains elevated or she remains symptomatic we will consider doing a CT scan.  We will do a chest x-ray and cardiac work-up.  Has a history of hyponatremia so we will check labs as well  Significant improvement in symptoms but still some dizziness and some elevated BP. Will bring a bit lower and see if it helps more. Has some baseline dizziness so not really sure how this compares.   BP improved. Dizziness improved. Will increase anti-hypertensives and PCP follow up.  Pertinent labs & imaging results that were available during my care of the patient were reviewed by me and considered in my medical decision making (see chart for details).  ____________________________________________  FINAL CLINICAL IMPRESSION(S) / ED DIAGNOSES  Final diagnoses:  Secondary hypertension  Lightheadedness    MEDICATIONS GIVEN DURING THIS VISIT:  Medications  hydrALAZINE (APRESOLINE) injection 5 mg (5 mg Intravenous Given 06/25/18 1319)    NEW OUTPATIENT MEDICATIONS STARTED DURING THIS VISIT:  Discharge Medication List as of 06/25/2018  4:35 PM       Note:  This note was prepared with assistance of Dragon voice recognition software. Occasional wrong-word or sound-a-like substitutions may have occurred due to the inherent limitations of voice recognition software.   Marily Memos, MD 06/25/18 [phone]

## 2018-06-25 NOTE — ED Notes (Signed)
ED Provider at bedside. 

## 2018-06-25 NOTE — ED Notes (Signed)
Pt states that she still does not have urge to urinate

## 2018-06-25 NOTE — ED Notes (Signed)
Pt took a couple steps in the room with the walker, pt ambulated steady. Pt states he dizziness had improved.

## 2018-06-25 NOTE — Telephone Encounter (Signed)
Called RX into [facility]

## 2018-06-25 NOTE — ED Triage Notes (Signed)
Pt arrives to ED from home with complaints of dizziness since 12 hours ago. EMS reports pt smells of urine, hx of vertigo, has taken 1 meclizine for the dizziness this morning with no relief. Pt has hx of htn, takes lisinopril and has had it this morning, manual BP with EMS 190/106. No blurred vision, SOB, headache, chest pain, but EMS did notice right sided facial droop while transporting pt. Pt placed in position of comfort with bed locked and lowered, call bell in reach.

## 2018-06-25 NOTE — ED Notes (Signed)
Encouraged pt to urinate, ensured that purewick device is in place, provide 2nd cup of water, family at bedside, family aware of need to continue pt to try to urinate

## 2018-06-25 NOTE — ED Notes (Signed)
istat sent to mini lab to hold

## 2018-06-25 NOTE — ED Notes (Signed)
Pt given crackers.  

## 2018-06-26 ENCOUNTER — Ambulatory Visit: Payer: Self-pay | Admitting: *Deleted

## 2018-06-26 NOTE — Telephone Encounter (Signed)
  Son, Mare FerrariJerome Fudala called in for his mother (pt).  He has been checking his mother's BP since she came home from the ED yesterday.   He is concerned because the "top number is high".   See the readings in the triage notes,    His mother has an appt on 07/01/18 for an ED follow up.   She is out of the office until then.    He only wanted to let Dr. Leretha PolSantiago know what his mother's BP was.    The Son [name] said he will take her back to the ED if her BP remains elevated.    I routed the note to Primary Care at [facility] to Dr. Leretha PolSantiago. Answer Assessment - Initial Assessment Questions 1. BLOOD PRESSURE: "What is the blood pressure?" "Did you take at least two measurements 5 minutes apart?"   197 on top  173/64   Then 156 on top on 3rd reading.   Bottom never went above 75.   The son is calling in due to the top number staying up.   2. ONSET: "When did you take your blood pressure?"     30-40 minutes ago. 3. HOW: "How did you obtain the blood pressure?" (e.g., visiting nurse, automatic home BP monitor)     Machine with cuff on upper arm. 4. HISTORY: "Do you have a history of high blood pressure?"     Yes on medication 5. MEDICATIONS: "Are you taking any medications for blood pressure?" "Have you missed any doses recently?"     No.    Yesterday she went to the hospital.  203/100 something so went to hospital.   We stayed at the hospital for 6-8 hours to get BP down.   They gave her an extra lisinopril.   6. OTHER SYMPTOMS: "Do you have any symptoms?" (e.g., headache, chest pain, blurred vision, difficulty breathing, weakness)     She had a headache before going to the hospital but no symptoms now. 7. PREGNANCY: "Is there any chance you are pregnant?" "When was your last menstrual period?"     Not asked due to age.  Protocols used: HIGH BLOOD PRESSURE-A-AH

## 2018-06-26 NOTE — Telephone Encounter (Signed)
Verbal order for home health PT only given. Please call [facility] and notify. thanks

## 2018-06-27 DIAGNOSIS — I10 Essential (primary) hypertension: Secondary | ICD-10-CM | POA: Diagnosis not present

## 2018-06-27 DIAGNOSIS — I2721 Secondary pulmonary arterial hypertension: Secondary | ICD-10-CM | POA: Diagnosis not present

## 2018-06-27 DIAGNOSIS — K219 Gastro-esophageal reflux disease without esophagitis: Secondary | ICD-10-CM | POA: Diagnosis not present

## 2018-06-27 DIAGNOSIS — E871 Hypo-osmolality and hyponatremia: Secondary | ICD-10-CM | POA: Diagnosis not present

## 2018-06-27 DIAGNOSIS — E039 Hypothyroidism, unspecified: Secondary | ICD-10-CM | POA: Diagnosis not present

## 2018-06-27 DIAGNOSIS — I5032 Chronic diastolic (congestive) heart failure: Secondary | ICD-10-CM | POA: Diagnosis not present

## 2018-06-27 NOTE — Telephone Encounter (Signed)
I called pt at [phone] with no answer.I was unable to leave vm due to it was full. If pt calls back ask pt if she would like to be seen today with  one of our other providers due to Dr. Leretha PolSantiago is not in the office.

## 2018-06-30 DIAGNOSIS — J9601 Acute respiratory failure with hypoxia: Secondary | ICD-10-CM | POA: Diagnosis not present

## 2018-06-30 DIAGNOSIS — I38 Endocarditis, valve unspecified: Secondary | ICD-10-CM | POA: Diagnosis not present

## 2018-06-30 NOTE — Telephone Encounter (Signed)
[facility]ed [facility] they will call back couldn't find anything on this order

## 2018-07-01 ENCOUNTER — Ambulatory Visit: Payer: Medicare Other | Admitting: Family Medicine

## 2018-07-01 NOTE — Telephone Encounter (Signed)
Copied from CRM [id number]. Topic: General - Other >> Jul 01, 2018 11:05 AM Tammy Fernandez, Tammy Fernandez wrote: Tammy KaufmannMelissa with [facility] called back to check the status of this message.  Will decline the order if they do not get Fernandez PT order only.   Best number - [phone]

## 2018-07-02 NOTE — Telephone Encounter (Signed)
I called given number and left message stating verbal order for PT services.

## 2018-07-02 NOTE — Telephone Encounter (Signed)
Pt has appt scheduled with [facility]ago on 07/08/18.

## 2018-07-04 DIAGNOSIS — I5032 Chronic diastolic (congestive) heart failure: Secondary | ICD-10-CM | POA: Diagnosis not present

## 2018-07-04 DIAGNOSIS — I2721 Secondary pulmonary arterial hypertension: Secondary | ICD-10-CM | POA: Diagnosis not present

## 2018-07-04 DIAGNOSIS — K219 Gastro-esophageal reflux disease without esophagitis: Secondary | ICD-10-CM | POA: Diagnosis not present

## 2018-07-04 DIAGNOSIS — E039 Hypothyroidism, unspecified: Secondary | ICD-10-CM | POA: Diagnosis not present

## 2018-07-04 DIAGNOSIS — E871 Hypo-osmolality and hyponatremia: Secondary | ICD-10-CM | POA: Diagnosis not present

## 2018-07-04 DIAGNOSIS — I10 Essential (primary) hypertension: Secondary | ICD-10-CM | POA: Diagnosis not present

## 2018-07-07 DIAGNOSIS — I2721 Secondary pulmonary arterial hypertension: Secondary | ICD-10-CM | POA: Diagnosis not present

## 2018-07-07 DIAGNOSIS — E039 Hypothyroidism, unspecified: Secondary | ICD-10-CM | POA: Diagnosis not present

## 2018-07-07 DIAGNOSIS — I5032 Chronic diastolic (congestive) heart failure: Secondary | ICD-10-CM | POA: Diagnosis not present

## 2018-07-07 DIAGNOSIS — E871 Hypo-osmolality and hyponatremia: Secondary | ICD-10-CM | POA: Diagnosis not present

## 2018-07-07 DIAGNOSIS — I10 Essential (primary) hypertension: Secondary | ICD-10-CM | POA: Diagnosis not present

## 2018-07-07 DIAGNOSIS — K219 Gastro-esophageal reflux disease without esophagitis: Secondary | ICD-10-CM | POA: Diagnosis not present

## 2018-07-08 ENCOUNTER — Ambulatory Visit (INDEPENDENT_AMBULATORY_CARE_PROVIDER_SITE_OTHER): Payer: Medicare Other | Admitting: Family Medicine

## 2018-07-08 ENCOUNTER — Encounter: Payer: Self-pay | Admitting: Family Medicine

## 2018-07-08 ENCOUNTER — Other Ambulatory Visit: Payer: Self-pay

## 2018-07-08 VITALS — BP 130/62 | HR 60 | Temp 97.9°F | Ht 62.0 in | Wt 172.0 lb

## 2018-07-08 DIAGNOSIS — B379 Candidiasis, unspecified: Secondary | ICD-10-CM | POA: Diagnosis not present

## 2018-07-08 DIAGNOSIS — I10 Essential (primary) hypertension: Secondary | ICD-10-CM

## 2018-07-08 DIAGNOSIS — J0100 Acute maxillary sinusitis, unspecified: Secondary | ICD-10-CM | POA: Diagnosis not present

## 2018-07-08 DIAGNOSIS — R110 Nausea: Secondary | ICD-10-CM

## 2018-07-08 DIAGNOSIS — R42 Dizziness and giddiness: Secondary | ICD-10-CM | POA: Diagnosis not present

## 2018-07-08 MED ORDER — FLUCONAZOLE 150 MG PO TABS: 150.0000 mg | ORAL_TABLET | ORAL | 0 refills | Status: DC

## 2018-07-08 MED ORDER — AMOXICILLIN-POT CLAVULANATE 875-125 MG PO TABS: 1.0000 | ORAL_TABLET | Freq: Two times a day (BID) | ORAL | 0 refills | Status: DC

## 2018-07-08 MED ORDER — AMLODIPINE BESYLATE 5 MG PO TABS: 5.0000 mg | ORAL_TABLET | Freq: Every day | ORAL | 3 refills | Status: DC

## 2018-07-08 MED ORDER — MECLIZINE HCL 12.5 MG PO TABS: ORAL_TABLET | ORAL | 0 refills | Status: DC

## 2018-07-08 NOTE — Patient Instructions (Signed)
     IF you received an x-ray today, you will receive an invoice from [facility]. Please contact [facility] at [phone] with questions or concerns regarding your invoice.   IF you received labwork today, you will receive an invoice from [facility]. Please contact [facility] at [phone] with questions or concerns regarding your invoice.   Our billing staff will not be able to assist you with questions regarding bills from these companies.  You will be contacted with the lab results as soon as they are available. The fastest way to get your results is to activate your My Chart account. Instructions are located on the last page of this paperwork. If you have not heard from us regarding the results in 2 weeks, please contact this office.     

## 2018-07-08 NOTE — Progress Notes (Signed)
8/6/201911:30 AM  Tammy Fernandez Jan 24, 1924, 84 y.o. female [id number]  Chief Complaint  Patient presents with  . Medication Problem    needs clarification on th bp meds she is taking. Taking too much medication that is causing dizziness    HPI:   Patient is a 84 y.o. female with past medical history significant for hyponatremia, constipation, HTN, CHF, pHTN on oxygen, CVA, GERD and seasonal allergies who presents for followup  Seen in ER on 7/24 for dizziness associated with very high BP She did not increase lisinopril but added amlodipine 5mg  instead BP at home much better  She however reports that she has been dizzy for about the past 2 weeks + room spinning and feeling off balance Worse when she lies down A/w right sided headache, ear pressure and sign worsening of nasal congestion and now thick white rhinorrhea Does well with amoxicillin despite keflex causing nausea  Home PT coming out, working with her gait and balance Feels she is doing well with them  Had been lying down and urinating in bed due to dizziness Yeast infection returned, just completed OTC vaginal treatment wo resolution. + vaginal discharge and irritation  Had to reschedule renal appt as that was day after ER  Patient reports normal daily BM, using probiotics and suppositories, no more enemas  Fall Risk  07/08/2018 06/10/2018 05/29/2018 04/24/2018 04/10/2018  Falls in the past year? No No No No No  Risk for fall due to : - - - - -     Depression screen [facility] 2/9 07/08/2018 06/10/2018 05/29/2018  Decreased Interest 0 0 0  Down, Depressed, Hopeless 0 0 0  PHQ - 2 Score 0 0 0    Allergies  Allergen Reactions  . Apple Flavor Hives    No apple sauce  . Crestor [Rosuvastatin Calcium] Other (See Comments)    Muscle Aches  . Keflex [Cephalexin] Other (See Comments)    dizziness  . Peanut Butter Flavor Hives  . Sulfa Antibiotics Hives  . Cephalexin Other (See Comments)    dizziness  . Crestor [Rosuvastatin  Calcium]   . Sulfa Drugs Cross Reactors Rash    Prior to Admission medications   Medication Sig Start Date End Date Taking? Authorizing Provider  albuterol (PROVENTIL) (2.5 MG/3ML) 0.083% nebulizer solution Take 3 mLs (2.5 mg total) by nebulization every 6 (six) hours as needed for wheezing or shortness of breath. 11/01/15  Yes Tat, Onalee Hua, MD  ALPRAZolam Prudy Feeler) 1 MG tablet Take 0.5 tablets (0.5 mg total) by mouth 2 (two) times daily. 04/10/18  Yes Myles Lipps, MD  Ascorbic Acid (VITAMIN C PO) Take 1 tablet by mouth daily.   Yes [provider]  Aspirin 81 MG EC tablet Take 81 mg by mouth daily.   Yes [provider]  azelastine (ASTELIN) 0.1 % nasal spray Place 1 spray into both nostrils 2 (two) times daily. Use in each nostril as directed 05/29/18  Yes Myles Lipps, MD  fluticasone [facility]) 50 MCG/ACT nasal spray Place 1 spray into both nostrils 2 (two) times daily. 05/29/18  Yes Myles Lipps, MD  levothyroxine (SYNTHROID, LEVOTHROID) 50 MCG tablet Take 1 tablet (50 mcg total) by mouth daily before breakfast. 03/25/18  Yes Myles Lipps, MD  lisinopril (PRINIVIL,ZESTRIL) 5 MG tablet Take 1 tablet (5 mg total) by mouth daily. 06/25/18  Yes Mesner, Barbara Cower, MD  meclizine (ANTIVERT) 12.5 MG tablet TAKE 1 TABLET BY MOUTH DAILY AS NEEDED FOR DIZZINESS 06/19/18  Yes  Myles Lipps, MD  olopatadine (PATANOL) 0.1 % ophthalmic solution Place 1 drop into both eyes daily as needed for allergies. 03/25/18  Yes Myles Lipps, MD  propranolol ER (INDERAL LA) 60 MG 24 hr capsule Take 1 capsule (60 mg total) by mouth daily. 03/25/18  Yes Myles Lipps, MD  Amlodipine 5mg  daily  Past Medical History:  Diagnosis Date  . Anxiety   . Chronic lower back pain   . Constipation    h/o fecal disimpaction on 2017  . CVA (cerebral vascular accident) (HCC) 2015   neg workup, MRI small acute lacunar infarcts. ASA only  . Depression   . Diastolic CHF, chronic (HCC)    grade I, most  recent echo Jan 2019  . Fatty liver   . GERD (gastroesophageal reflux disease)   . Hyperlipidemia   . Hypertension   . Hyponatremia    multiple episodes with encephalopathy, renal thinks SIADH  . Hypothyroidism, unspecified   . Internal hemorrhoid   . NSTEMI (non-ST elevated myocardial infarction) (HCC) 08/28/2016  . Palpitations   . PFO with atrial septal aneurysm    TTE 2015  . Pneumonia 2016  . Severe pulmonary arterial systolic hypertension (HCC) 12/25/2017   per echo, on 1L oxgen via [place]    Past Surgical History:  Procedure Laterality Date  . ABDOMINAL HYSTERECTOMY    . CATARACT EXTRACTION W/ INTRAOCULAR LENS  IMPLANT, BILATERAL Bilateral   . CESAREAN SECTION    . TEE WITHOUT CARDIOVERSION N/A 01/19/2014   Procedure: TRANSESOPHAGEAL ECHOCARDIOGRAM (TEE);  Surgeon: Laurey Morale, MD;  Location: [facility] ENDOSCOPY;  Service: Cardiovascular;  Laterality: N/A;  [name]  . VAGINAL HYSTERECTOMY      Social History   Tobacco Use  . Smoking status: Never Smoker  . Smokeless tobacco: Former Neurosurgeon    Types: Snuff  . Tobacco comment: "used snuff when I was 19"  Substance Use Topics  . Alcohol use: No    Family History  Problem Relation Age of Onset  . Stroke Father   . Hypertension Father   . Colon cancer Cousin   . Colon cancer Unknown        Aunt  . Diabetes Unknown        aunt and cousins  . Heart disease Unknown        cousins  . Healthy Daughter   . Healthy Son     Review of Systems  Constitutional: Positive for malaise/fatigue. Negative for chills, diaphoresis and fever.  HENT: Positive for congestion and sinus pain. Negative for ear pain and sore throat.   Respiratory: Negative for cough and shortness of breath.   Cardiovascular: Negative for chest pain, palpitations and leg swelling.  Gastrointestinal: Positive for nausea. Negative for abdominal pain, constipation and vomiting.  Neurological: Positive for dizziness and headaches.   Per  hpi  OBJECTIVE:  Blood pressure 130/62, pulse 60, temperature 97.9 F (36.6 C), temperature source Oral, height 5\' 2"  (1.575 m), weight 175 lb (79.4 kg), SpO2 100 %. Body mass index is 32.01 kg/m.   Wt Readings from Last 3 Encounters:  07/08/18 175 lb (79.4 kg)  06/25/18 177 lb (80.3 kg)  06/10/18 177 lb (80.3 kg)    Physical Exam  Constitutional: She is oriented to person, place, and time. She appears well-developed and well-nourished.  HENT:  Head: Normocephalic and atraumatic.  Right Ear: Hearing, tympanic membrane, external ear and ear canal normal.  Left Ear: Hearing, tympanic membrane, external ear and ear canal  normal.  Nose: Mucosal edema and rhinorrhea present. Right sinus exhibits maxillary sinus tenderness and frontal sinus tenderness. Left sinus exhibits maxillary sinus tenderness. Left sinus exhibits no frontal sinus tenderness.  Mouth/Throat: Oropharynx is clear and moist.  Eyes: Pupils are equal, round, and reactive to light. EOM are normal.  Neck: Neck supple.  Cardiovascular: Normal rate, regular rhythm and normal heart sounds. Exam reveals no gallop and no friction rub.  No murmur heard. Pulmonary/Chest: Effort normal and breath sounds normal. She has no wheezes. She has no rales.  Using oxygen via [place]  Abdominal: Soft. Bowel sounds are normal. She exhibits no distension and no mass. There is no hepatosplenomegaly. There is tenderness in the right upper quadrant and left lower quadrant. There is rebound. There is no guarding and negative Murphy's sign.  Lymphadenopathy:    She has no cervical adenopathy.  Neurological: She is alert and oriented to person, place, and time.  Skin: Skin is warm and dry.  Nursing note and vitals reviewed.   ASSESSMENT and PLAN  1. Acute non-recurrent maxillary sinusitis Discussed supportive measures, new meds r/se/b and RTC precautions. Patient educational handout given.  2. Nausea without vomiting 3. Dizziness Seems to be  related to current sinus infection. Refilled meclizine as requested. Has tolerated well in the past. R/se/b reviewed. Re-eval at next visit  4. Essential hypertension Controlled. Continue current regime.   5. Yeast infection Refilled diflucan  Other orders - amLODipine (NORVASC) 5 MG tablet; Take 1 tablet (5 mg total) by mouth daily. - amoxicillin-clavulanate (AUGMENTIN) 875-125 MG tablet; Take 1 tablet by mouth 2 (two) times daily. - fluconazole (DIFLUCAN) 150 MG tablet; Take 1 tablet (150 mg total) by mouth every 3 (three) days. - meclizine (ANTIVERT) 12.5 MG tablet; TAKE 1 TABLET BY MOUTH DAILY AS NEEDED FOR DIZZINESS  Return in about 1 month (around 08/05/2018).    Myles LippsIrma M Santiago, MD Primary Care at [facility] [address] [place], [place] [address] Ph.  [phone] Fax [phone]

## 2018-07-10 DIAGNOSIS — E039 Hypothyroidism, unspecified: Secondary | ICD-10-CM | POA: Diagnosis not present

## 2018-07-10 DIAGNOSIS — K219 Gastro-esophageal reflux disease without esophagitis: Secondary | ICD-10-CM | POA: Diagnosis not present

## 2018-07-10 DIAGNOSIS — I10 Essential (primary) hypertension: Secondary | ICD-10-CM | POA: Diagnosis not present

## 2018-07-10 DIAGNOSIS — E871 Hypo-osmolality and hyponatremia: Secondary | ICD-10-CM | POA: Diagnosis not present

## 2018-07-10 DIAGNOSIS — I5032 Chronic diastolic (congestive) heart failure: Secondary | ICD-10-CM | POA: Diagnosis not present

## 2018-07-10 DIAGNOSIS — I2721 Secondary pulmonary arterial hypertension: Secondary | ICD-10-CM | POA: Diagnosis not present

## 2018-07-11 ENCOUNTER — Telehealth: Payer: Self-pay | Admitting: Family Medicine

## 2018-07-11 DIAGNOSIS — I2721 Secondary pulmonary arterial hypertension: Secondary | ICD-10-CM | POA: Diagnosis not present

## 2018-07-11 DIAGNOSIS — I10 Essential (primary) hypertension: Secondary | ICD-10-CM | POA: Diagnosis not present

## 2018-07-11 DIAGNOSIS — E871 Hypo-osmolality and hyponatremia: Secondary | ICD-10-CM | POA: Diagnosis not present

## 2018-07-11 DIAGNOSIS — I5032 Chronic diastolic (congestive) heart failure: Secondary | ICD-10-CM | POA: Diagnosis not present

## 2018-07-11 DIAGNOSIS — K219 Gastro-esophageal reflux disease without esophagitis: Secondary | ICD-10-CM | POA: Diagnosis not present

## 2018-07-11 DIAGNOSIS — E039 Hypothyroidism, unspecified: Secondary | ICD-10-CM | POA: Diagnosis not present

## 2018-07-11 NOTE — Telephone Encounter (Signed)
Pt came in on 07/08/2018 and states that she was having an upset stomach and nothing was called in to the pharmacy for that.

## 2018-07-15 DIAGNOSIS — I5032 Chronic diastolic (congestive) heart failure: Secondary | ICD-10-CM | POA: Diagnosis not present

## 2018-07-15 DIAGNOSIS — I2721 Secondary pulmonary arterial hypertension: Secondary | ICD-10-CM | POA: Diagnosis not present

## 2018-07-15 DIAGNOSIS — E039 Hypothyroidism, unspecified: Secondary | ICD-10-CM | POA: Diagnosis not present

## 2018-07-15 DIAGNOSIS — K219 Gastro-esophageal reflux disease without esophagitis: Secondary | ICD-10-CM | POA: Diagnosis not present

## 2018-07-15 DIAGNOSIS — I10 Essential (primary) hypertension: Secondary | ICD-10-CM | POA: Diagnosis not present

## 2018-07-15 DIAGNOSIS — E871 Hypo-osmolality and hyponatremia: Secondary | ICD-10-CM | POA: Diagnosis not present

## 2018-07-15 NOTE — Telephone Encounter (Signed)
Dr. Leretha PolSantiago   I do not see a mention of something being called in for upset stomach. Please advise

## 2018-07-17 DIAGNOSIS — K219 Gastro-esophageal reflux disease without esophagitis: Secondary | ICD-10-CM | POA: Diagnosis not present

## 2018-07-17 DIAGNOSIS — E039 Hypothyroidism, unspecified: Secondary | ICD-10-CM | POA: Diagnosis not present

## 2018-07-17 DIAGNOSIS — I10 Essential (primary) hypertension: Secondary | ICD-10-CM | POA: Diagnosis not present

## 2018-07-17 DIAGNOSIS — I5032 Chronic diastolic (congestive) heart failure: Secondary | ICD-10-CM | POA: Diagnosis not present

## 2018-07-17 DIAGNOSIS — E871 Hypo-osmolality and hyponatremia: Secondary | ICD-10-CM | POA: Diagnosis not present

## 2018-07-17 DIAGNOSIS — I2721 Secondary pulmonary arterial hypertension: Secondary | ICD-10-CM | POA: Diagnosis not present

## 2018-07-17 MED ORDER — OMEPRAZOLE 20 MG PO CPDR: 20.0000 mg | DELAYED_RELEASE_CAPSULE | Freq: Every day | ORAL | 3 refills | Status: DC

## 2018-07-17 NOTE — Telephone Encounter (Signed)
Sent rx for omeprazole.  °

## 2018-07-18 DIAGNOSIS — I2721 Secondary pulmonary arterial hypertension: Secondary | ICD-10-CM | POA: Diagnosis not present

## 2018-07-18 DIAGNOSIS — K219 Gastro-esophageal reflux disease without esophagitis: Secondary | ICD-10-CM | POA: Diagnosis not present

## 2018-07-18 DIAGNOSIS — I5032 Chronic diastolic (congestive) heart failure: Secondary | ICD-10-CM | POA: Diagnosis not present

## 2018-07-18 DIAGNOSIS — E871 Hypo-osmolality and hyponatremia: Secondary | ICD-10-CM | POA: Diagnosis not present

## 2018-07-18 DIAGNOSIS — I10 Essential (primary) hypertension: Secondary | ICD-10-CM | POA: Diagnosis not present

## 2018-07-18 DIAGNOSIS — E039 Hypothyroidism, unspecified: Secondary | ICD-10-CM | POA: Diagnosis not present

## 2018-07-21 ENCOUNTER — Other Ambulatory Visit: Payer: Self-pay | Admitting: Family Medicine

## 2018-07-22 DIAGNOSIS — K219 Gastro-esophageal reflux disease without esophagitis: Secondary | ICD-10-CM | POA: Diagnosis not present

## 2018-07-22 DIAGNOSIS — I5032 Chronic diastolic (congestive) heart failure: Secondary | ICD-10-CM | POA: Diagnosis not present

## 2018-07-22 DIAGNOSIS — E871 Hypo-osmolality and hyponatremia: Secondary | ICD-10-CM | POA: Diagnosis not present

## 2018-07-22 DIAGNOSIS — I2721 Secondary pulmonary arterial hypertension: Secondary | ICD-10-CM | POA: Diagnosis not present

## 2018-07-22 DIAGNOSIS — E039 Hypothyroidism, unspecified: Secondary | ICD-10-CM | POA: Diagnosis not present

## 2018-07-22 DIAGNOSIS — I10 Essential (primary) hypertension: Secondary | ICD-10-CM | POA: Diagnosis not present

## 2018-07-24 DIAGNOSIS — E039 Hypothyroidism, unspecified: Secondary | ICD-10-CM | POA: Diagnosis not present

## 2018-07-24 DIAGNOSIS — K219 Gastro-esophageal reflux disease without esophagitis: Secondary | ICD-10-CM | POA: Diagnosis not present

## 2018-07-24 DIAGNOSIS — E871 Hypo-osmolality and hyponatremia: Secondary | ICD-10-CM | POA: Diagnosis not present

## 2018-07-24 DIAGNOSIS — I10 Essential (primary) hypertension: Secondary | ICD-10-CM | POA: Diagnosis not present

## 2018-07-24 DIAGNOSIS — I5032 Chronic diastolic (congestive) heart failure: Secondary | ICD-10-CM | POA: Diagnosis not present

## 2018-07-24 DIAGNOSIS — I2721 Secondary pulmonary arterial hypertension: Secondary | ICD-10-CM | POA: Diagnosis not present

## 2018-07-25 DIAGNOSIS — E871 Hypo-osmolality and hyponatremia: Secondary | ICD-10-CM | POA: Diagnosis not present

## 2018-07-25 DIAGNOSIS — E039 Hypothyroidism, unspecified: Secondary | ICD-10-CM | POA: Diagnosis not present

## 2018-07-25 DIAGNOSIS — I5032 Chronic diastolic (congestive) heart failure: Secondary | ICD-10-CM | POA: Diagnosis not present

## 2018-07-25 DIAGNOSIS — I2721 Secondary pulmonary arterial hypertension: Secondary | ICD-10-CM | POA: Diagnosis not present

## 2018-07-25 DIAGNOSIS — K219 Gastro-esophageal reflux disease without esophagitis: Secondary | ICD-10-CM | POA: Diagnosis not present

## 2018-07-25 DIAGNOSIS — I10 Essential (primary) hypertension: Secondary | ICD-10-CM | POA: Diagnosis not present

## 2018-07-30 ENCOUNTER — Telehealth: Payer: Self-pay | Admitting: Family Medicine

## 2018-07-30 NOTE — Telephone Encounter (Signed)
Pt son is calling to see why the demeclocycline has been denied saying that she needs an appt  When Dr. Leretha PolSantiago saw her in July 2019 and her next appt is 08/05/18   Best number to call is [phone]

## 2018-07-30 NOTE — Telephone Encounter (Signed)
Left message for pt to return call to the office regarding why the medication below was requested for a refill. Pt will need to discuss current symptoms.

## 2018-07-31 DIAGNOSIS — I38 Endocarditis, valve unspecified: Secondary | ICD-10-CM | POA: Diagnosis not present

## 2018-07-31 DIAGNOSIS — J9601 Acute respiratory failure with hypoxia: Secondary | ICD-10-CM | POA: Diagnosis not present

## 2018-08-01 DIAGNOSIS — I10 Essential (primary) hypertension: Secondary | ICD-10-CM | POA: Diagnosis not present

## 2018-08-01 DIAGNOSIS — E039 Hypothyroidism, unspecified: Secondary | ICD-10-CM | POA: Diagnosis not present

## 2018-08-01 DIAGNOSIS — I2721 Secondary pulmonary arterial hypertension: Secondary | ICD-10-CM | POA: Diagnosis not present

## 2018-08-01 DIAGNOSIS — I5032 Chronic diastolic (congestive) heart failure: Secondary | ICD-10-CM | POA: Diagnosis not present

## 2018-08-01 DIAGNOSIS — E871 Hypo-osmolality and hyponatremia: Secondary | ICD-10-CM | POA: Diagnosis not present

## 2018-08-01 DIAGNOSIS — K219 Gastro-esophageal reflux disease without esophagitis: Secondary | ICD-10-CM | POA: Diagnosis not present

## 2018-08-05 ENCOUNTER — Encounter: Payer: Self-pay | Admitting: Family Medicine

## 2018-08-05 ENCOUNTER — Ambulatory Visit (INDEPENDENT_AMBULATORY_CARE_PROVIDER_SITE_OTHER): Payer: Medicare Other | Admitting: Family Medicine

## 2018-08-05 ENCOUNTER — Other Ambulatory Visit: Payer: Self-pay

## 2018-08-05 VITALS — BP 134/84 | HR 85 | Temp 98.6°F | Ht 62.0 in | Wt 178.0 lb

## 2018-08-05 DIAGNOSIS — E871 Hypo-osmolality and hyponatremia: Secondary | ICD-10-CM | POA: Diagnosis not present

## 2018-08-05 DIAGNOSIS — I2721 Secondary pulmonary arterial hypertension: Secondary | ICD-10-CM

## 2018-08-05 DIAGNOSIS — Z23 Encounter for immunization: Secondary | ICD-10-CM

## 2018-08-05 DIAGNOSIS — I10 Essential (primary) hypertension: Secondary | ICD-10-CM | POA: Diagnosis not present

## 2018-08-05 DIAGNOSIS — K219 Gastro-esophageal reflux disease without esophagitis: Secondary | ICD-10-CM

## 2018-08-05 DIAGNOSIS — E039 Hypothyroidism, unspecified: Secondary | ICD-10-CM | POA: Diagnosis not present

## 2018-08-05 DIAGNOSIS — J9611 Chronic respiratory failure with hypoxia: Secondary | ICD-10-CM

## 2018-08-05 DIAGNOSIS — I5032 Chronic diastolic (congestive) heart failure: Secondary | ICD-10-CM | POA: Diagnosis not present

## 2018-08-05 MED ORDER — DEMECLOCYCLINE HCL 300 MG PO TABS: ORAL_TABLET | ORAL | 3 refills | Status: DC

## 2018-08-05 NOTE — Progress Notes (Signed)
9/3/201911:48 AM  Tammy Fernandez [birth year], 84 y.o. female [id number]  Chief Complaint  Patient presents with  . Medication Refill    HPI:   Patient is a 84 y.o. female with past medical history significant for hyponatremia, constipation, HTN, CHF, pHTN on oxygen, CVA, GERD and seasonal allergies  who presents today for routine followup  Overall doing much better Sinuses and dizziness resolved with augmentin Never got in with renal Stomach still gets bloated occ, taking PPI Drinks almond milk But eats lots bananas and garlic We will see if these changes help and if not then ref to GI Has BM daily with suppositories  Fall Risk  08/05/2018 07/08/2018 06/10/2018 05/29/2018 04/24/2018  Falls in the past year? No No No No No  Risk for fall due to : - - - - -     Depression screen [facility] 2/9 08/05/2018 07/08/2018 06/10/2018  Decreased Interest 0 0 0  Down, Depressed, Hopeless 0 0 0  PHQ - 2 Score 0 0 0    Allergies  Allergen Reactions  . Apple Flavor Hives    No apple sauce  . Crestor [Rosuvastatin Calcium] Other (See Comments)    Muscle Aches  . Keflex [Cephalexin] Other (See Comments)    dizziness  . Peanut Butter Flavor Hives  . Sulfa Antibiotics Hives  . Cephalexin Other (See Comments)    dizziness  . Crestor [Rosuvastatin Calcium]   . Sulfa Drugs Cross Reactors Rash    Prior to Admission medications   Medication Sig Start Date End Date Taking? Authorizing Provider  albuterol (PROVENTIL) (2.5 MG/3ML) 0.083% nebulizer solution Take 3 mLs (2.5 mg total) by nebulization every 6 (six) hours as needed for wheezing or shortness of breath. 11/01/15  Yes Tat, Onalee Hua, MD  ALPRAZolam Prudy Feeler) 1 MG tablet Take 0.5 tablets (0.5 mg total) by mouth 2 (two) times daily. 04/10/18  Yes Myles Lipps, MD  amLODipine (NORVASC) 5 MG tablet Take 1 tablet (5 mg total) by mouth daily. 07/08/18  Yes Myles Lipps, MD  amoxicillin-clavulanate (AUGMENTIN) 875-125 MG tablet Take 1 tablet by mouth 2  (two) times daily. 07/08/18  Yes Myles Lipps, MD  Ascorbic Acid (VITAMIN C PO) Take 1 tablet by mouth daily.   Yes [provider]  Aspirin 81 MG EC tablet Take 81 mg by mouth daily.   Yes [provider]  azelastine (ASTELIN) 0.1 % nasal spray Place 1 spray into both nostrils 2 (two) times daily. Use in each nostril as directed 05/29/18  Yes Myles Lipps, MD  demeclocycline (DECLOMYCIN) 300 MG tablet TK 1/2 T PO BID 07/09/18  Yes [provider]  fluconazole (DIFLUCAN) 150 MG tablet Take 1 tablet (150 mg total) by mouth every 3 (three) days. 07/08/18  Yes Myles Lipps, MD  fluticasone (FLONASE) 50 MCG/ACT nasal spray Place 1 spray into both nostrils 2 (two) times daily. 05/29/18  Yes Myles Lipps, MD  levothyroxine (SYNTHROID, LEVOTHROID) 50 MCG tablet Take 1 tablet (50 mcg total) by mouth daily before breakfast. 03/25/18  Yes Myles Lipps, MD  lisinopril (PRINIVIL,ZESTRIL) 5 MG tablet Take 2 tablets (10 mg total) by mouth daily. 06/25/18  Yes Mesner, Barbara Cower, MD  meclizine (ANTIVERT) 12.5 MG tablet TAKE 1 TABLET BY MOUTH DAILY AS NEEDED FOR DIZZINESS 07/08/18  Yes Myles Lipps, MD  olopatadine (PATANOL) 0.1 % ophthalmic solution Place 1 drop into both eyes daily as needed for allergies. 03/25/18  Yes Myles Lipps, MD  omeprazole (PRILOSEC) 20 MG capsule Take 1 capsule (20 mg total) by mouth daily. 07/17/18  Yes Myles Lipps, MD  propranolol ER (INDERAL LA) 60 MG 24 hr capsule Take 1 capsule (60 mg total) by mouth daily. 03/25/18  Yes Myles Lipps, MD    Past Medical History:  Diagnosis Date  . Anxiety   . Chronic lower back pain   . Constipation    h/o fecal disimpaction on 2017  . CVA (cerebral vascular accident) (HCC) 2015   neg workup, MRI small acute lacunar infarcts. ASA only  . Depression   . Diastolic CHF, chronic (HCC)    grade I, most recent echo Jan 2019  . Fatty liver   . GERD (gastroesophageal reflux disease)   . Hyperlipidemia    . Hypertension   . Hyponatremia    multiple episodes with encephalopathy, renal thinks SIADH  . Hypothyroidism, unspecified   . Internal hemorrhoid   . NSTEMI (non-ST elevated myocardial infarction) (HCC) 08/28/2016  . Palpitations   . PFO with atrial septal aneurysm    TTE 2015  . Pneumonia 2016  . Severe pulmonary arterial systolic hypertension (HCC) 12/25/2017   per echo, on 1L oxgen via [place]    Past Surgical History:  Procedure Laterality Date  . ABDOMINAL HYSTERECTOMY    . CATARACT EXTRACTION W/ INTRAOCULAR LENS  IMPLANT, BILATERAL Bilateral   . CESAREAN SECTION    . TEE WITHOUT CARDIOVERSION N/A 01/19/2014   Procedure: TRANSESOPHAGEAL ECHOCARDIOGRAM (TEE);  Surgeon: Laurey Morale, MD;  Location: [facility] ENDOSCOPY;  Service: Cardiovascular;  Laterality: N/A;  [name]  . VAGINAL HYSTERECTOMY      Social History   Tobacco Use  . Smoking status: Never Smoker  . Smokeless tobacco: Former Neurosurgeon    Types: Snuff  . Tobacco comment: "used snuff when I was 19"  Substance Use Topics  . Alcohol use: No    Family History  Problem Relation Age of Onset  . Stroke Father   . Hypertension Father   . Colon cancer Cousin   . Colon cancer Unknown        Aunt  . Diabetes Unknown        aunt and cousins  . Heart disease Unknown        cousins  . Healthy Daughter   . Healthy Son     Review of Systems  Constitutional: Negative for chills and fever.  Respiratory: Negative for cough and shortness of breath.   Cardiovascular: Negative for chest pain, palpitations and leg swelling.  Gastrointestinal: Positive for heartburn. Negative for abdominal pain, nausea and vomiting.     OBJECTIVE:  Blood pressure 134/84, pulse 85, temperature 98.6 F (37 C), temperature source Oral, height 5\' 2"  (1.575 m), weight 178 lb (80.7 kg), SpO2 97 %. Body mass index is 32.56 kg/m.   Physical Exam  Constitutional: She is oriented to person, place, and time. She appears well-developed and  well-nourished.  wheelchair  HENT:  Head: Normocephalic and atraumatic.  Mouth/Throat: Oropharynx is clear and moist. No oropharyngeal exudate.  Eyes: Pupils are equal, round, and reactive to light. Conjunctivae and EOM are normal. No scleral icterus.  Neck: Neck supple.  Cardiovascular: Normal rate and regular rhythm. Exam reveals no gallop and no friction rub.  Murmur heard. Pulmonary/Chest: Effort normal and breath sounds normal. She has no wheezes. She has no rales.  Oxygen via   Musculoskeletal: She exhibits no edema.  Neurological: She is alert and oriented to person, place, and  time.  Skin: Skin is warm and dry.  Psychiatric: She has a normal mood and affect.  Nursing note and vitals reviewed.   ASSESSMENT and PLAN  1. Hyponatremia Checking labs today, medications will be adjusted as needed. Advised scheduling renal appt - Basic Metabolic Panel  2. Essential hypertension Controlled. Continue current regime.   3. Gastroesophageal reflux disease without esophagitis Uncontrolled. Discussed dietary changes. Consider GI  4. Need for vaccination prevnar 13 today. She will come back later for high dose flu  Other orders - demeclocycline (DECLOMYCIN) 300 MG tablet; TK 1/2 T PO BID - Pneumococcal conjugate vaccine 13-valent IM  Return in about 2 months (around 10/05/2018).    Myles Lipps, MD Primary Care at [facility] [address] [place], [place] [address] Ph.  [phone] Fax [phone]

## 2018-08-05 NOTE — Patient Instructions (Addendum)
     If you have lab work done today you will be contacted with your lab results within the next 2 weeks.  If you have not heard from [place] then please contact us. The fastest way to get your results is to register for My Chart.   IF you received an x-ray today, you will receive an invoice from [facility]. Please contact [facility] at [phone] with questions or concerns regarding your invoice.   IF you received labwork today, you will receive an invoice from [facility]. Please contact [facility] at [phone] with questions or concerns regarding your invoice.   Our billing staff will not be able to assist you with questions regarding bills from these companies.  You will be contacted with the lab results as soon as they are available. The fastest way to get your results is to activate your My Chart account. Instructions are located on the last page of this paperwork. If you have not heard from [place] regarding the results in 2 weeks, please contact this office.    gerd Food Choices for Gastroesophageal Reflux Disease, Adult When you have gastroesophageal reflux disease (GERD), the foods you eat and your eating habits are very important. Choosing the right foods can help ease your discomfort. What guidelines do I need to follow?  Choose fruits, vegetables, whole grains, and low-fat dairy products.  Choose low-fat meat, fish, and poultry.  Limit fats such as oils, salad dressings, butter, nuts, and avocado.  Keep a food diary. This helps you identify foods that cause symptoms.  Avoid foods that cause symptoms. These may be different for everyone.  Eat small meals often instead of 3 large meals a day.  Eat your meals slowly, in a place where you are relaxed.  Limit fried foods.  Cook foods using methods other than frying.  Avoid drinking alcohol.  Avoid drinking large amounts of liquids with your meals.  Avoid bending over or lying down until 2-3 hours after  eating. What foods are not recommended? These are some foods and drinks that may make your symptoms worse: Vegetables Tomatoes. Tomato juice. Tomato and spaghetti sauce. Chili peppers. Onion and garlic. Horseradish. Fruits Oranges, grapefruit, and lemon (fruit and juice). Meats High-fat meats, fish, and poultry. This includes hot dogs, ribs, ham, sausage, salami, and bacon. Dairy Whole milk and chocolate milk. Sour cream. Cream. Butter. Ice cream. Cream cheese. Drinks Coffee and tea. Bubbly (carbonated) drinks or energy drinks. Condiments Hot sauce. Barbecue sauce. Sweets/Desserts Chocolate and cocoa. Donuts. Peppermint and spearmint. Fats and Oils High-fat foods. This includes [place] fries and potato chips. Other Vinegar. Strong spices. This includes black pepper, white pepper, red pepper, cayenne, curry powder, cloves, ginger, and chili powder. The items listed above may not be a complete list of foods and drinks to avoid. Contact your dietitian for more information. This information is not intended to replace advice given to you by your health care provider. Make sure you discuss any questions you have with your health care provider. Document Released: 05/20/2012 Document Revised: 04/26/2016 Document Reviewed: 09/23/2013 Elsevier Interactive Patient Education  2017 [employer].

## 2018-08-06 LAB — BASIC METABOLIC PANEL
BUN/Creatinine Ratio: 15 (ref 12–28)
BUN: 10 mg/dL (ref 10–36)
CO2: 38 mmol/L (ref 20–29)
Calcium: 9.6 mg/dL (ref 8.7–10.3)
Chloride: 89 mmol/L — ABNORMAL LOW (ref 96–106)
Creatinine, Ser: 0.65 mg/dL (ref 0.57–1.00)
GFR calc Af Amer: 89 mL/min/{1.73_m2} (ref >59)
GFR calc non Af Amer: 77 mL/min/{1.73_m2} (ref >59)
Glucose: 126 mg/dL — ABNORMAL HIGH (ref 65–99)
Potassium: 4.1 mmol/L (ref 3.5–5.2)
Sodium: 139 mmol/L (ref 134–144)

## 2018-08-06 NOTE — Addendum Note (Signed)
Addended by: Myles Lipps on: 08/06/2018 08:15 AM   Modules accepted: Orders

## 2018-08-08 DIAGNOSIS — K219 Gastro-esophageal reflux disease without esophagitis: Secondary | ICD-10-CM | POA: Diagnosis not present

## 2018-08-08 DIAGNOSIS — I5032 Chronic diastolic (congestive) heart failure: Secondary | ICD-10-CM | POA: Diagnosis not present

## 2018-08-08 DIAGNOSIS — E871 Hypo-osmolality and hyponatremia: Secondary | ICD-10-CM | POA: Diagnosis not present

## 2018-08-08 DIAGNOSIS — E039 Hypothyroidism, unspecified: Secondary | ICD-10-CM | POA: Diagnosis not present

## 2018-08-08 DIAGNOSIS — I2721 Secondary pulmonary arterial hypertension: Secondary | ICD-10-CM | POA: Diagnosis not present

## 2018-08-08 DIAGNOSIS — I10 Essential (primary) hypertension: Secondary | ICD-10-CM | POA: Diagnosis not present

## 2018-08-12 ENCOUNTER — Telehealth: Payer: Self-pay | Admitting: Family Medicine

## 2018-08-12 ENCOUNTER — Other Ambulatory Visit: Payer: Self-pay | Admitting: Family Medicine

## 2018-08-12 ENCOUNTER — Ambulatory Visit (INDEPENDENT_AMBULATORY_CARE_PROVIDER_SITE_OTHER): Payer: Medicare Other | Admitting: Family Medicine

## 2018-08-12 DIAGNOSIS — I10 Essential (primary) hypertension: Secondary | ICD-10-CM | POA: Diagnosis not present

## 2018-08-12 DIAGNOSIS — Z1231 Encounter for screening mammogram for malignant neoplasm of breast: Secondary | ICD-10-CM

## 2018-08-12 DIAGNOSIS — E871 Hypo-osmolality and hyponatremia: Secondary | ICD-10-CM | POA: Diagnosis not present

## 2018-08-12 DIAGNOSIS — E039 Hypothyroidism, unspecified: Secondary | ICD-10-CM | POA: Diagnosis not present

## 2018-08-12 DIAGNOSIS — I2721 Secondary pulmonary arterial hypertension: Secondary | ICD-10-CM | POA: Diagnosis not present

## 2018-08-12 DIAGNOSIS — I5032 Chronic diastolic (congestive) heart failure: Secondary | ICD-10-CM | POA: Diagnosis not present

## 2018-08-12 DIAGNOSIS — K219 Gastro-esophageal reflux disease without esophagitis: Secondary | ICD-10-CM | POA: Diagnosis not present

## 2018-08-12 LAB — BASIC METABOLIC PANEL
BUN/Creatinine Ratio: 15 (ref 12–28)
BUN: 11 mg/dL (ref 10–36)
CO2: 36 mmol/L — ABNORMAL HIGH (ref 20–29)
Calcium: 9.8 mg/dL (ref 8.7–10.3)
Chloride: 87 mmol/L — ABNORMAL LOW (ref 96–106)
Creatinine, Ser: 0.71 mg/dL (ref 0.57–1.00)
GFR calc Af Amer: 86 mL/min/{1.73_m2} (ref >59)
GFR calc non Af Amer: 74 mL/min/{1.73_m2} (ref >59)
Glucose: 165 mg/dL — ABNORMAL HIGH (ref 65–99)
Potassium: 4.1 mmol/L (ref 3.5–5.2)
Sodium: 137 mmol/L (ref 134–144)

## 2018-08-12 NOTE — Telephone Encounter (Signed)
Copied from CRM [id number]. Topic: Inquiry >> Aug 12, 2018  1:17 PM Windy Kalata, NT wrote: Reason for CRM: patient is requesting to speak with [facility].   For [employer]

## 2018-08-13 NOTE — Telephone Encounter (Signed)
Please see note below. 

## 2018-08-14 ENCOUNTER — Encounter: Payer: Self-pay | Admitting: *Deleted

## 2018-08-14 ENCOUNTER — Ambulatory Visit: Payer: Medicare Other | Admitting: Family Medicine

## 2018-08-15 DIAGNOSIS — I10 Essential (primary) hypertension: Secondary | ICD-10-CM | POA: Diagnosis not present

## 2018-08-15 DIAGNOSIS — E871 Hypo-osmolality and hyponatremia: Secondary | ICD-10-CM | POA: Diagnosis not present

## 2018-08-15 DIAGNOSIS — I5032 Chronic diastolic (congestive) heart failure: Secondary | ICD-10-CM | POA: Diagnosis not present

## 2018-08-15 DIAGNOSIS — K219 Gastro-esophageal reflux disease without esophagitis: Secondary | ICD-10-CM | POA: Diagnosis not present

## 2018-08-15 DIAGNOSIS — E039 Hypothyroidism, unspecified: Secondary | ICD-10-CM | POA: Diagnosis not present

## 2018-08-15 DIAGNOSIS — I2721 Secondary pulmonary arterial hypertension: Secondary | ICD-10-CM | POA: Diagnosis not present

## 2018-08-16 ENCOUNTER — Other Ambulatory Visit: Payer: Self-pay | Admitting: Family Medicine

## 2018-08-18 NOTE — Telephone Encounter (Signed)
Albuterol inhaler refill Last Refill:10/12/15 by another provider Last OV: 08/05/18; Upcoming 10/09/18 PCP: Leretha PolSantiago [facility] [phone] - [place] - [address] AT [facility] [address] [facility] [address] [phone] (Phone) [phone] (Fax)

## 2018-08-19 DIAGNOSIS — I10 Essential (primary) hypertension: Secondary | ICD-10-CM | POA: Diagnosis not present

## 2018-08-19 DIAGNOSIS — E871 Hypo-osmolality and hyponatremia: Secondary | ICD-10-CM | POA: Diagnosis not present

## 2018-08-19 DIAGNOSIS — E039 Hypothyroidism, unspecified: Secondary | ICD-10-CM | POA: Diagnosis not present

## 2018-08-19 DIAGNOSIS — K219 Gastro-esophageal reflux disease without esophagitis: Secondary | ICD-10-CM | POA: Diagnosis not present

## 2018-08-19 DIAGNOSIS — I2721 Secondary pulmonary arterial hypertension: Secondary | ICD-10-CM | POA: Diagnosis not present

## 2018-08-19 DIAGNOSIS — I5032 Chronic diastolic (congestive) heart failure: Secondary | ICD-10-CM | POA: Diagnosis not present

## 2018-08-22 DIAGNOSIS — K219 Gastro-esophageal reflux disease without esophagitis: Secondary | ICD-10-CM | POA: Diagnosis not present

## 2018-08-22 DIAGNOSIS — I10 Essential (primary) hypertension: Secondary | ICD-10-CM | POA: Diagnosis not present

## 2018-08-22 DIAGNOSIS — E871 Hypo-osmolality and hyponatremia: Secondary | ICD-10-CM | POA: Diagnosis not present

## 2018-08-22 DIAGNOSIS — I5032 Chronic diastolic (congestive) heart failure: Secondary | ICD-10-CM | POA: Diagnosis not present

## 2018-08-22 DIAGNOSIS — I2721 Secondary pulmonary arterial hypertension: Secondary | ICD-10-CM | POA: Diagnosis not present

## 2018-08-22 DIAGNOSIS — E039 Hypothyroidism, unspecified: Secondary | ICD-10-CM | POA: Diagnosis not present

## 2018-08-23 ENCOUNTER — Other Ambulatory Visit: Payer: Self-pay | Admitting: Family Medicine

## 2018-08-25 ENCOUNTER — Other Ambulatory Visit: Payer: Self-pay | Admitting: Family Medicine

## 2018-08-25 NOTE — Telephone Encounter (Signed)
Pt requesting 90 day supply

## 2018-08-25 NOTE — Telephone Encounter (Signed)
Propranolol ER 60 mg  refill Last Refill:03/25/18 # 30  And 5 refills Last OV: 08/05/18   NOV  10/09/18 PCP: Leretha PolSantiago Pharmacy: [facility] # [phone]  Requesting 90 days supply

## 2018-08-25 NOTE — Telephone Encounter (Signed)
Unable to refill narcotic per refill policy.  Alprazolam refill Last refill:04/10/18 #60/3 refill Last OV:08/05/18 RUE:[id number]:Santiago [facility]: [facility][id number] [place] - [address] [facility] [address] [facility] [address] [phone] (Phone) [phone] (Fax)

## 2018-08-26 ENCOUNTER — Ambulatory Visit: Payer: Medicare Other | Admitting: Cardiology

## 2018-08-26 ENCOUNTER — Telehealth: Payer: Self-pay | Admitting: Family Medicine

## 2018-08-26 NOTE — Telephone Encounter (Signed)
Xanax refill Last Refill:04/10/18  # 60 with 3 refills Last OV: 08/12/18 PCP: Dr. Leretha PolSantiago [facility] [address]

## 2018-08-26 NOTE — Telephone Encounter (Signed)
Copied from CRM [id number]. Topic: Quick Communication - Rx Refill/Question >> Aug 26, 2018  2:11 PM Stephannie LiSimmons, Roseanna Koplin L, NT wrote: Medication:  ALPRAZolam Prudy Feeler(XANAX) 1 MG tablet  Has the patient contacted their pharmacy? yes [id number] (Agent: If no, request that the patient contact the pharmacy for the refill. (Agent: If yes, when and what did the pharmacy advise?  Preferred Pharmacy (with phone number or street name [facility][id number] - , [place] - [address] AT [facility] [address] [facility] [address] [phone] (Phone) [phone] ( The pharmacy called and states they never received the  original request    Agent: Please be advised that RX refills may take up to 3 business days. We ask that you follow-up with your pharmacy.

## 2018-08-27 ENCOUNTER — Telehealth: Payer: Self-pay | Admitting: Family Medicine

## 2018-08-27 NOTE — Telephone Encounter (Signed)
Patient's concern/request has been addressed already by another provider or staff member. 

## 2018-08-27 NOTE — Telephone Encounter (Signed)
Call placed to [facility] to inquire about refills on Declomycin. Noted in chart that  This medication was ordered 08/05/18  #30 with 3 refills. [facility] confirmed order and stated that the request was made to soon but the patient can pick up her Declomycin tomorrow. Xanax was pended to provider 08/25/18.

## 2018-08-27 NOTE — Telephone Encounter (Signed)
Copied from CRM [id number]. Topic: Quick Communication - Rx Refill/Question >> Aug 27, 2018  9:08 AM Jaquita Rector A wrote: Medication: ALPRAZolam Prudy Feeler) 1 MG tablet, demeclocycline (DECLOMYCIN) 300 MG tablet  Has the patient contacted their pharmacy? Yes  Preferred Pharmacy (with phone number or street name):   [facility] #[id number] - , [place] - [address] AT [facility] OF [address] & [facility] [address] [phone] (Phone) [phone] (Fax)    Agent: Please be advised that RX refills may take up to 3 business days. We ask that you follow-up with your pharmacy.

## 2018-08-31 DIAGNOSIS — I38 Endocarditis, valve unspecified: Secondary | ICD-10-CM | POA: Diagnosis not present

## 2018-08-31 DIAGNOSIS — J9601 Acute respiratory failure with hypoxia: Secondary | ICD-10-CM | POA: Diagnosis not present

## 2018-09-01 ENCOUNTER — Institutional Professional Consult (permissible substitution): Payer: Medicare Other | Admitting: Internal Medicine

## 2018-09-02 ENCOUNTER — Other Ambulatory Visit: Payer: Self-pay | Admitting: Family Medicine

## 2018-09-03 NOTE — Telephone Encounter (Signed)
Requested Prescriptions  Refused Prescriptions Disp Refills  . albuterol (PROVENTIL) (2.5 MG/3ML) 0.083% nebulizer solution [Pharmacy Med Name: ALBUTEROL 0.083%(2.5MG /3ML) 60X3ML] 180 mL 0    Sig: USE 3 ML VIA NEBULIZER EVERY 4 TO 6 HOURS AS NEEDED.     Pulmonology:  Beta Agonists Failed - 09/02/2018  8:50 PM      Failed - One inhaler should last at least one month. If the patient is requesting refills earlier, contact the patient to check for uncontrolled symptoms.      Passed - Valid encounter within last 12 months    Recent Outpatient Visits          3 weeks ago Hyponatremia   [facility] Meda Coffee, MD   4 weeks ago Hyponatremia   [facility] Meda Coffee, MD   1 month ago Acute non-recurrent maxillary sinusitis   [facility] Meda Coffee, MD   2 months ago Cough   [facility], Meda Coffee, MD   3 months ago Hyponatremia   [facility], Meda Coffee, MD      Future Appointments            In 1 month Myles Lipps, MD Primary [facility] at [facility]         Requesting 2 weeks too early.

## 2018-09-05 ENCOUNTER — Ambulatory Visit (INDEPENDENT_AMBULATORY_CARE_PROVIDER_SITE_OTHER): Payer: Medicare Other | Admitting: Family Medicine

## 2018-09-05 ENCOUNTER — Encounter: Payer: Self-pay | Admitting: Family Medicine

## 2018-09-05 ENCOUNTER — Other Ambulatory Visit: Payer: Self-pay

## 2018-09-05 VITALS — BP 188/68 | HR 65 | Temp 98.3°F

## 2018-09-05 DIAGNOSIS — I10 Essential (primary) hypertension: Secondary | ICD-10-CM

## 2018-09-05 DIAGNOSIS — B373 Candidiasis of vulva and vagina: Secondary | ICD-10-CM | POA: Diagnosis not present

## 2018-09-05 DIAGNOSIS — E871 Hypo-osmolality and hyponatremia: Secondary | ICD-10-CM | POA: Diagnosis not present

## 2018-09-05 DIAGNOSIS — E039 Hypothyroidism, unspecified: Secondary | ICD-10-CM

## 2018-09-05 DIAGNOSIS — B3731 Acute candidiasis of vulva and vagina: Secondary | ICD-10-CM

## 2018-09-05 MED ORDER — NYSTATIN 100000 UNIT/GM EX OINT
1.0000 | TOPICAL_OINTMENT | Freq: Two times a day (BID) | CUTANEOUS | 3 refills | Status: DC
Start: 2018-09-05 — End: 2018-09-10

## 2018-09-05 MED ORDER — OMEPRAZOLE 20 MG PO CPDR: 20.0000 mg | DELAYED_RELEASE_CAPSULE | Freq: Every day | ORAL | 3 refills | Status: DC

## 2018-09-05 NOTE — Progress Notes (Signed)
10/4/201912:36 PM  Tammy Fernandez Jan 18, 1924, 84 y.o. female [id number]  Chief Complaint  Patient presents with  . Sodium Check    HPI:   Patient is a 84 y.o. female with past medical history significant for hyponatremia 2/2 SIADH, constipation, HTN, CHF, pHTN on oxygen, CVA, GERD and seasonal allergies  who presents today for routine followup who presents today for routine followup  BP Readings from Last 3 Encounters:  09/05/18 (!) 162/69  08/05/18 134/84  07/08/18 130/62   Continues to struggle with keeping oxygen on at night Nasal cannula keeps falling out her nose She drops down to 70s Tapping not helping  Sees pulm later this month Needs new nebulizer machine  She has been forgetful of recent  She thinks she took her BP meds this morning but son not sure as they were rushed this morning Has noticed increased urination, she denies any dysuria or urgency She has not been using her bedside commode after she had one fall Has recurring yeast infection Not using any barrier ointments Wheel chair, uses depends  Weight has been stable, checked via CHF program   Fall Risk  08/05/2018 07/08/2018 06/10/2018 05/29/2018 04/24/2018  Falls in the past year? No No No No No  Risk for fall due to : - - - - -     Depression screen [facility] 2/9 08/05/2018 07/08/2018 06/10/2018  Decreased Interest 0 0 0  Down, Depressed, Hopeless 0 0 0  PHQ - 2 Score 0 0 0    Allergies  Allergen Reactions  . Apple Flavor Hives    No apple sauce  . Crestor [Rosuvastatin Calcium] Other (See Comments)    Muscle Aches  . Keflex [Cephalexin] Other (See Comments)    dizziness  . Peanut Butter Flavor Hives  . Sulfa Antibiotics Hives  . Cephalexin Other (See Comments)    dizziness  . Crestor [Rosuvastatin Calcium]   . Sulfa Drugs Cross Reactors Rash    Prior to Admission medications   Medication Sig Start Date End Date Taking? Authorizing Provider  albuterol (PROVENTIL) (2.5 MG/3ML) 0.083% nebulizer  solution USE 3 ML VIA NEBULIZER EVERY 4 TO 6 HOURS AS NEEDED. 08/19/18   Myles Lipps, MD  ALPRAZolam Prudy Feeler) 1 MG tablet TAKE 1 TABLET BY MOUTH TWICE DAILY 08/27/18   Myles Lipps, MD  amLODipine (NORVASC) 5 MG tablet Take 1 tablet (5 mg total) by mouth daily. 07/08/18   Myles Lipps, MD  Ascorbic Acid (VITAMIN C PO) Take 1 tablet by mouth daily.    [provider]  Aspirin 81 MG EC tablet Take 81 mg by mouth daily.    [provider]  azelastine (ASTELIN) 0.1 % nasal spray Place 1 spray into both nostrils 2 (two) times daily. Use in each nostril as directed 05/29/18   Myles Lipps, MD  demeclocycline (DECLOMYCIN) 300 MG tablet TK 1/2 T PO BID 08/05/18   Myles Lipps, MD  fluconazole (DIFLUCAN) 150 MG tablet Take 1 tablet (150 mg total) by mouth every 3 (three) days. 07/08/18   Myles Lipps, MD  fluticasone (FLONASE) 50 MCG/ACT nasal spray Place 1 spray into both nostrils 2 (two) times daily. 05/29/18   Myles Lipps, MD  levothyroxine (SYNTHROID, LEVOTHROID) 50 MCG tablet Take 1 tablet (50 mcg total) by mouth daily before breakfast. 03/25/18   Myles Lipps, MD  lisinopril (PRINIVIL,ZESTRIL) 5 MG tablet Take 2 tablets (10 mg total) by mouth daily. 06/25/18   Mesner, Barbara Cower,  MD  meclizine (ANTIVERT) 12.5 MG tablet TAKE 1 TABLET BY MOUTH AS NEEDED FOR DIZZINESS 08/25/18   Myles Lipps, MD  olopatadine (PATANOL) 0.1 % ophthalmic solution Place 1 drop into both eyes daily as needed for allergies. 03/25/18   Myles Lipps, MD  omeprazole (PRILOSEC) 20 MG capsule Take 1 capsule (20 mg total) by mouth daily. 07/17/18   Myles Lipps, MD  propranolol ER (INDERAL LA) 60 MG 24 hr capsule TAKE 1 CAPSULE BY MOUTH DAILY 08/25/18   Myles Lipps, MD    Past Medical History:  Diagnosis Date  . Anxiety   . Chronic lower back pain   . Constipation    h/o fecal disimpaction on 2017  . CVA (cerebral vascular accident) (HCC) 2015   neg workup, MRI small acute lacunar  infarcts. ASA only  . Depression   . Diastolic CHF, chronic (HCC)    grade I, most recent echo Jan 2019  . Fatty liver   . GERD (gastroesophageal reflux disease)   . Hyperlipidemia   . Hypertension   . Hyponatremia    multiple episodes with encephalopathy, renal thinks SIADH  . Hypothyroidism, unspecified   . Internal hemorrhoid   . NSTEMI (non-ST elevated myocardial infarction) (HCC) 08/28/2016  . Palpitations   . PFO with atrial septal aneurysm    TTE 2015  . Pneumonia 2016  . Severe pulmonary arterial systolic hypertension (HCC) 12/25/2017   per echo, on 1L oxgen via [place]    Past Surgical History:  Procedure Laterality Date  . ABDOMINAL HYSTERECTOMY    . CATARACT EXTRACTION W/ INTRAOCULAR LENS  IMPLANT, BILATERAL Bilateral   . CESAREAN SECTION    . TEE WITHOUT CARDIOVERSION N/A 01/19/2014   Procedure: TRANSESOPHAGEAL ECHOCARDIOGRAM (TEE);  Surgeon: Laurey Morale, MD;  Location: [facility] ENDOSCOPY;  Service: Cardiovascular;  Laterality: N/A;  [name]  . VAGINAL HYSTERECTOMY      Social History   Tobacco Use  . Smoking status: Never Smoker  . Smokeless tobacco: Former Neurosurgeon    Types: Snuff  . Tobacco comment: "used snuff when I was 19"  Substance Use Topics  . Alcohol use: No    Family History  Problem Relation Age of Onset  . Stroke Father   . Hypertension Father   . Colon cancer Cousin   . Colon cancer Unknown        Aunt  . Diabetes Unknown        aunt and cousins  . Heart disease Unknown        cousins  . Healthy Daughter   . Healthy Son     Review of Systems  Constitutional: Negative for chills and fever.  Respiratory: Negative for cough and shortness of breath.   Cardiovascular: Negative for chest pain, palpitations and leg swelling.  Gastrointestinal: Negative for abdominal pain, nausea and vomiting.   Per hpi  OBJECTIVE:  Blood pressure (!) 188/68, pulse 65, temperature 98.3 F (36.8 C), temperature source Oral. There is no height or weight on  file to calculate BMI.   Physical Exam  Constitutional: She is oriented to person, place, and time. She appears well-developed and well-nourished.  HENT:  Head: Normocephalic and atraumatic.  Mouth/Throat: Oropharynx is clear and moist. No oropharyngeal exudate.  Eyes: Pupils are equal, round, and reactive to light. Conjunctivae and EOM are normal. No scleral icterus.  Neck: Neck supple.  Cardiovascular: Normal rate, regular rhythm and normal heart sounds. Exam reveals no gallop and no friction rub.  No murmur heard. Pulmonary/Chest: Effort normal and breath sounds normal. She has no wheezes. She has no rales.  Musculoskeletal: She exhibits no edema.  Neurological: She is alert and oriented to person, place, and time.  Skin: Skin is warm and dry.  Psychiatric: She has a normal mood and affect.  Nursing note and vitals reviewed. elderly, in wheelchair, using oxygen via nasal cannula   ASSESSMENT and PLAN  1. Hyponatremia Checking labs today, has been stable of recent on current meds. Per hosp notes 2/2 siadh. She has not seen renal yet despite my repetitive requests to do so - Basic metabolic panel  2. Essential hypertension Elevated today. Usually very well controlled. Weight stable, lungs clear, no edema, recheck bp at home, monitor daily, if elevated please call on Monday to adjust medications.  3. Hypothyroidism, unspecified type Checking labs today, medications will be adjusted as needed.  - TSH  4. Vaginal candida Discussed supportive measures, uses of barrier ointments, importance of keeping area dry. Nystatin cream  Other orders - nystatin ointment (MYCOSTATIN); Apply 1 application topically 2 (two) times daily. - omeprazole (PRILOSEC) 20 MG capsule; Take 1 capsule (20 mg total) by mouth daily.  Return in about 4 weeks (around 10/03/2018).    Myles Lipps, MD Primary Care at [facility] [address] [place], [place] [address] Ph.  [phone] Fax [phone]

## 2018-09-05 NOTE — Patient Instructions (Signed)
° ° ° °  If you have lab work done today you will be contacted with your lab results within the next 2 weeks.  If you have not heard from us then please contact us. The fastest way to get your results is to register for My Chart. ° ° °IF you received an x-ray today, you will receive an invoice from [facility]. Please contact [facility] at [phone] with questions or concerns regarding your invoice.  ° °IF you received labwork today, you will receive an invoice from [facility]. Please contact [facility] at [phone] with questions or concerns regarding your invoice.  ° °Our billing staff will not be able to assist you with questions regarding bills from these companies. ° °You will be contacted with the lab results as soon as they are available. The fastest way to get your results is to activate your My Chart account. Instructions are located on the last page of this paperwork. If you have not heard from us regarding the results in 2 weeks, please contact this office. °  ° ° ° °

## 2018-09-06 LAB — BASIC METABOLIC PANEL
BUN/Creatinine Ratio: 25 (ref 12–28)
BUN: 15 mg/dL (ref 10–36)
CO2: 34 mmol/L — ABNORMAL HIGH (ref 20–29)
Calcium: 10.2 mg/dL (ref 8.7–10.3)
Chloride: 89 mmol/L — ABNORMAL LOW (ref 96–106)
Creatinine, Ser: 0.6 mg/dL (ref 0.57–1.00)
GFR calc Af Amer: 92 mL/min/{1.73_m2} (ref >59)
GFR calc non Af Amer: 79 mL/min/{1.73_m2} (ref >59)
Glucose: 124 mg/dL — ABNORMAL HIGH (ref 65–99)
Potassium: 4.7 mmol/L (ref 3.5–5.2)
Sodium: 140 mmol/L (ref 134–144)

## 2018-09-06 LAB — TSH: TSH: 1.49 u[IU]/mL (ref 0.450–4.500)

## 2018-09-06 NOTE — Progress Notes (Signed)
Lab only visit 

## 2018-09-08 ENCOUNTER — Telehealth: Payer: Self-pay | Admitting: *Deleted

## 2018-09-08 ENCOUNTER — Encounter: Payer: Self-pay | Admitting: *Deleted

## 2018-09-08 NOTE — Telephone Encounter (Signed)
Result note read to patient and her son 'Tammy Fernandez.'  Verbalizes understanding. Pt states she had discussed symptoms and need for Diflucan at last office visit. Stated has yeast infection. Was not called in to pharmacy. Made aware Prilosec was called in to preferred pharmacy.  Telephone encounter as result note was not routed to [facility]

## 2018-09-09 ENCOUNTER — Institutional Professional Consult (permissible substitution): Payer: Medicare Other | Admitting: Internal Medicine

## 2018-09-10 ENCOUNTER — Ambulatory Visit (INDEPENDENT_AMBULATORY_CARE_PROVIDER_SITE_OTHER): Payer: Medicare Other | Admitting: Cardiology

## 2018-09-10 ENCOUNTER — Encounter: Payer: Self-pay | Admitting: Cardiology

## 2018-09-10 VITALS — BP 116/62 | HR 66 | Ht 62.0 in | Wt 179.0 lb

## 2018-09-10 DIAGNOSIS — R0789 Other chest pain: Secondary | ICD-10-CM

## 2018-09-10 DIAGNOSIS — J9611 Chronic respiratory failure with hypoxia: Secondary | ICD-10-CM | POA: Diagnosis not present

## 2018-09-10 DIAGNOSIS — I5032 Chronic diastolic (congestive) heart failure: Secondary | ICD-10-CM | POA: Diagnosis not present

## 2018-09-10 NOTE — Progress Notes (Signed)
09/10/2018 Tammy Fernandez   04-Apr-1924  [id number]  Primary Physician Myles Lipps, MD Primary Cardiologist: Dr Allyson Sabal  HPI:  [name] 84 y/o female seen by Dr Sharyn Lull in 2014 for chest pain, low risk Myoview then. She had a TEE in 2015 when she was being worked up for a stroke. Dr Elease Hashimoto saw her once in 2017 when she presented with SSCP and had mildly elevated Troponin. He felt she was not a candidate for for further work up. She has no history of documented CAD.  In Jan 2019 she presented with respiratory failure failure felt to be secondary to diastolic CHF and pulmonary HTN. She was also markedly hyponatremic, some of this may have been from laxative abuse as the patient says she was "cleaning her colon". Echo Jan 2019 showed EF 60-65% with grade 2 DD and significant pulmonary HTN with a PA pressure 72 mmHg.  She is on chronic O2. Her mobility is limited, mainly wheel chair, but she likes to use a recumbent bike. No new complaints since LOV.    Current Outpatient Medications  Medication Sig Dispense Refill  . ALPRAZolam (XANAX) 1 MG tablet TAKE 1 TABLET BY MOUTH TWICE DAILY (Patient taking differently: Take 0.5 mg by mouth 2 (two) times daily. ) 60 tablet 0  . amLODipine (NORVASC) 5 MG tablet Take 1 tablet (5 mg total) by mouth daily. 90 tablet 3  . Ascorbic Acid (VITAMIN C PO) Take 1 tablet by mouth daily.    . Aspirin 81 MG EC tablet Take 81 mg by mouth daily.    Marland Kitchen azelastine (ASTELIN) 0.1 % nasal spray Place 1 spray into both nostrils 2 (two) times daily. Use in each nostril as directed 30 mL 5  . demeclocycline (DECLOMYCIN) 300 MG tablet TK 1/2 T PO BID 30 tablet 3  . fluconazole (DIFLUCAN) 150 MG tablet Take 1 tablet (150 mg total) by mouth every 3 (three) days. 2 tablet 0  . fluticasone (FLONASE) 50 MCG/ACT nasal spray Place 1 spray into both nostrils 2 (two) times daily. 16 g 5  . levothyroxine (SYNTHROID, LEVOTHROID) 50 MCG tablet Take 1 tablet (50 mcg total) by mouth daily  before breakfast. 30 tablet 5  . lisinopril (PRINIVIL,ZESTRIL) 5 MG tablet Take 2 tablets (10 mg total) by mouth daily. 60 tablet 5  . meclizine (ANTIVERT) 12.5 MG tablet TAKE 1 TABLET BY MOUTH AS NEEDED FOR DIZZINESS 30 tablet 1  . olopatadine (PATANOL) 0.1 % ophthalmic solution Place 1 drop into both eyes daily as needed for allergies. 5 mL 5  . omeprazole (PRILOSEC) 20 MG capsule Take 1 capsule (20 mg total) by mouth daily. 30 capsule 3  . propranolol ER (INDERAL LA) 60 MG 24 hr capsule TAKE 1 CAPSULE BY MOUTH DAILY 90 capsule 1   No current facility-administered medications for this visit.     Allergies  Allergen Reactions  . Crestor [Rosuvastatin Calcium] Other (See Comments)    Muscle Aches  . Keflex [Cephalexin] Other (See Comments)    dizziness  . Peanut Butter Flavor Hives  . Sulfa Antibiotics Hives  . Cephalexin Other (See Comments)    dizziness  . Sulfa Drugs Cross Reactors Rash    Past Medical History:  Diagnosis Date  . Anxiety   . Chronic lower back pain   . Constipation    h/o fecal disimpaction on 2017  . CVA (cerebral vascular accident) (HCC) 2015   neg workup, MRI small acute lacunar infarcts. ASA only  .  Depression   . Diastolic CHF, chronic (HCC)    grade I, most recent echo Jan 2019  . Fatty liver   . GERD (gastroesophageal reflux disease)   . Hyperlipidemia   . Hypertension   . Hyponatremia    multiple episodes with encephalopathy, renal thinks SIADH  . Hypothyroidism, unspecified   . Internal hemorrhoid   . NSTEMI (non-ST elevated myocardial infarction) (HCC) 08/28/2016  . Palpitations   . PFO with atrial septal aneurysm    TTE 2015  . Pneumonia 2016  . Severe pulmonary arterial systolic hypertension (HCC) 12/25/2017   per echo, on 1L oxgen via [place]    Social History   Socioeconomic History  . Marital status: Widowed    Spouse name: Not on file  . Number of children: 7  . Years of education: Not on file  . Highest education level: Not on  file  Occupational History  . Not on file  Social Needs  . Financial resource strain: Not on file  . Food insecurity:    Worry: Not on file    Inability: Not on file  . Transportation needs:    Medical: Not on file    Non-medical: Not on file  Tobacco Use  . Smoking status: Never Smoker  . Smokeless tobacco: Former Neurosurgeon    Types: Snuff  . Tobacco comment: "used snuff when I was 19"  Substance and Sexual Activity  . Alcohol use: No  . Drug use: No  . Sexual activity: Not Currently  Lifestyle  . Physical activity:    Days per week: Not on file    Minutes per session: Not on file  . Stress: Not on file  Relationships  . Social connections:    Talks on phone: Not on file    Gets together: Not on file    Attends religious service: Not on file    Active member of club or organization: Not on file    Attends meetings of clubs or organizations: Not on file    Relationship status: Not on file  . Intimate partner violence:    Fear of current or ex partner: Not on file    Emotionally abused: Not on file    Physically abused: Not on file    Forced sexual activity: Not on file  Other Topics Concern  . Not on file  Social History Narrative   ** Merged History Encounter **       Patient lives at home with her son           Family History  Problem Relation Age of Onset  . Stroke Father   . Hypertension Father   . Colon cancer Cousin   . Colon cancer Unknown        Aunt  . Diabetes Unknown        aunt and cousins  . Heart disease Unknown        cousins  . Healthy Daughter   . Healthy Son      Review of Systems: General: negative for chills, fever, night sweats or weight changes.  Cardiovascular: negative for chest pain, dyspnea on exertion, edema, orthopnea, palpitations, paroxysmal nocturnal dyspnea or shortness of breath Dermatological: negative for rash Respiratory: negative for cough or wheezing Urologic: negative for hematuria Abdominal: negative for nausea,  vomiting, diarrhea, bright red blood per rectum, melena, or hematemesis Neurologic: negative for visual changes, syncope, or dizziness All other systems reviewed and are otherwise negative except as noted above.  Blood pressure 116/62, pulse 66, height 5\' 2"  (1.575 m), weight 179 lb (81.2 kg), SpO2 95 %.  General appearance: alert, cooperative, no distress and mildly obese Lungs: decreased breath sounds- no wheezing Heart: regular rate and rhythm and soft systolic murmurm LSB Extremities: no edema Skin: warm and dry Neurologic: Grossly normal   ASSESSMENT AND PLAN:   History of cest pain, atypical Localized Rt chest pain, low risk Myoview 2014 (Dr Sharyn Lull) . Not felt to be a candidate for further testing when admitted in Sept 2017 (Dr Elease Hashimoto).  Chronic diastolic CHF (congestive heart failure) (HCC) Echo Jan 2019 when patient was admitted with respiratory failure and hyponatremia showed EF 60-65% with grade 2 DD and severe pulmonary HTN (PA 72 mmHg)  Chronic respiratory failure with hypoxia (HCC) Followed by Dr Marchelle Gearing- pt has severe pulmonary HTN and is on chronic O2.   Acute hyponatremia In the setting of laxative abuse- Jan 2019. She has been on chronic Demeclocycline for this as well as one salt tablet a day. Her last Na+ was 140  History of CVA (cerebrovascular accident) H/O CVA 2015  Severe pulmonary arterial systolic hypertension (HCC) PA pressure 72 mmHg  Abnormal EKG Chronic inferior-lateral TWI  PLAN  Same cardiac Rx- we will see in 6 months.   Corine Shelter PA-C 09/10/2018 10:42 AM

## 2018-09-10 NOTE — Patient Instructions (Signed)
Medication Instructions:  Continue current medications  If you need a refill on your cardiac medications before your next appointment, please call your pharmacy.  Labwork: None Ordered   If you have labs (blood work) drawn today and your tests are completely normal, you will receive your results only by: Marland Kitchen MyChart Message (if you have MyChart) OR . A paper copy in the mail If you have any lab test that is abnormal or we need to change your treatment, we will call you to review the results.  Testing/Procedures: None Ordered   Follow-Up: At [facility], you and your health needs are our priority.  As part of our continuing mission to provide you with exceptional heart care, we have created designated Provider Care Teams.  These Care Teams include your primary Cardiologist (physician) and Advanced Practice Providers (APPs -  Physician Assistants and Nurse Practitioners) who all work together to provide you with the care you need, when you need it. . You will need a follow up appointment in 6 Months with Tammy Fernandez.  Please call our office 2 months in [phone]) to schedule this appointment.       Thank you for choosing [facility]!!

## 2018-09-11 ENCOUNTER — Ambulatory Visit: Payer: Medicare Other

## 2018-09-16 ENCOUNTER — Other Ambulatory Visit: Payer: Self-pay | Admitting: Family Medicine

## 2018-09-16 MED ORDER — LEVOTHYROXINE SODIUM 50 MCG PO TABS: 50.0000 ug | ORAL_TABLET | Freq: Every day | ORAL | 5 refills | Status: DC

## 2018-09-16 NOTE — Telephone Encounter (Signed)
Requested Prescriptions  Pending Prescriptions Disp Refills  . levothyroxine (SYNTHROID, LEVOTHROID) 50 MCG tablet 30 tablet 5    Sig: Take 1 tablet (50 mcg total) by mouth daily before breakfast.     Endocrinology:  Hypothyroid Agents Failed - 09/16/2018  9:21 AM      Failed - TSH needs to be rechecked within 3 months after an abnormal result. Refill until TSH is due.      Passed - TSH in normal range and within 360 days    TSH  Date Value Ref Range Status  09/05/2018 1.490 0.450 - 4.500 uIU/mL Final         Passed - Valid encounter within last 12 months    Recent Outpatient Visits          1 week ago Hyponatremia   [facility] Meda Coffee, MD   1 month ago Hyponatremia   [facility], Meda Coffee, MD   1 month ago Hyponatremia   [facility], Meda Coffee, MD   2 months ago Acute non-recurrent maxillary sinusitis   [facility], Meda Coffee, MD   3 months ago Cough   [facility], Meda Coffee, MD      Future Appointments            In 3 weeks Myles Lipps, MD [facility] at [place], [facility]

## 2018-09-16 NOTE — Telephone Encounter (Signed)
Copied from CRM [id number]. Topic: Quick Communication - Rx Refill/Question >> Sep 16, 2018  8:50 AM Crist Infante wrote: Medication: levothyroxine (SYNTHROID, LEVOTHROID) 50 MCG tablet Please do a 90 day  [id number] - [place], [place] - [address] AT [facility] [address] & [facility] [address] [phone] (Phone) [phone] (Fax)

## 2018-09-17 ENCOUNTER — Ambulatory Visit: Payer: Medicare Other

## 2018-09-22 ENCOUNTER — Institutional Professional Consult (permissible substitution): Payer: Medicare Other | Admitting: Internal Medicine

## 2018-09-30 DIAGNOSIS — I38 Endocarditis, valve unspecified: Secondary | ICD-10-CM | POA: Diagnosis not present

## 2018-09-30 DIAGNOSIS — J9601 Acute respiratory failure with hypoxia: Secondary | ICD-10-CM | POA: Diagnosis not present

## 2018-10-02 ENCOUNTER — Other Ambulatory Visit: Payer: Self-pay | Admitting: Family Medicine

## 2018-10-02 NOTE — Telephone Encounter (Signed)
Requested medication (s) are due for refill today: yes  Requested medication (s) are on the active medication list: yes    Last refill: Xanax  08/27/18  #60  0 refills    Meclizine  08/25/18  #30 1 refill  Future visit scheduled yes 10/09/18  Dr. Leretha Pol  Notes to clinic:not delegated  Requested Prescriptions  Pending Prescriptions Disp Refills   ALPRAZolam (XANAX) 1 MG tablet [Pharmacy Med Name: ALPRAZOLAM 1MG  TABLETS] 60 tablet 0    Sig: TAKE 1 TABLET BY MOUTH TWICE DAILY     Not Delegated - Psychiatry:  Anxiolytics/Hypnotics Failed - 10/02/2018  9:41 AM      Failed - This refill cannot be delegated      Passed - Urine Drug Screen completed in last 360 days.      Passed - Valid encounter within last 6 months    Recent Outpatient Visits          3 weeks ago Hyponatremia   [facility], Meda Coffee, MD   1 month ago Hyponatremia   [facility] Meda Coffee, MD   1 month ago Hyponatremia   [facility], Meda Coffee, MD   2 months ago Acute non-recurrent maxillary sinusitis   [facility], Meda Coffee, MD   3 months ago Cough   [facility], Meda Coffee, MD      Future Appointments            In 1 week Myles Lipps, MD [facility] at [place], [facility]          meclizine (ANTIVERT) 12.5 MG tablet [Pharmacy Med Name: MECLIZINE 12.5MG  (RX) TABLETS] 30 tablet 0    Sig: TAKE 1 TABLET BY MOUTH AS NEEDED FOR DIZZINESS     Not Delegated - Gastroenterology: Antiemetics Failed - 10/02/2018  9:41 AM      Failed - This refill cannot be delegated      Passed - Valid encounter within last 6 months    Recent Outpatient Visits          3 weeks ago Hyponatremia   [facility], Meda Coffee, MD   1 month ago Hyponatremia   [facility] Meda Coffee, MD   1 month ago Hyponatremia   [facility], Meda Coffee, MD   2 months ago Acute non-recurrent maxillary sinusitis   [facility], Meda Coffee, MD   3 months ago Cough   [facility], Meda Coffee, MD      Future Appointments            In 1 week Myles Lipps, MD [facility] at [place], [facility]          ALPRAZolam Prudy Feeler) 1 MG tablet [Pharmacy Med Name: ALPRAZOLAM 1MG  TABLETS] 60 tablet 0    Sig: TAKE 1 TABLET BY MOUTH TWICE DAILY     Not Delegated - Psychiatry:  Anxiolytics/Hypnotics Failed - 10/02/2018  9:41 AM      Failed - This refill cannot be delegated      Passed - Urine Drug Screen completed in last 360 days.      Passed - Valid encounter within last 6 months    Recent Outpatient Visits          3 weeks ago Hyponatremia   [facility], Meda Coffee, MD   1 month  ago Hyponatremia   [facility], Meda Coffee, MD   1 month ago Hyponatremia   [facility], Meda Coffee, MD   2 months ago Acute non-recurrent maxillary sinusitis   [facility], Meda Coffee, MD   3 months ago Cough   [facility], Meda Coffee, MD      Future Appointments            In 1 week Myles Lipps, MD [facility] at [place], [facility]

## 2018-10-02 NOTE — Telephone Encounter (Signed)
pmp reviewed Refilled meds

## 2018-10-03 ENCOUNTER — Institutional Professional Consult (permissible substitution): Payer: Medicare Other | Admitting: Internal Medicine

## 2018-10-07 ENCOUNTER — Ambulatory Visit: Payer: Self-pay

## 2018-10-07 ENCOUNTER — Ambulatory Visit: Payer: Self-pay | Admitting: Family Medicine

## 2018-10-07 NOTE — Telephone Encounter (Addendum)
Called Son back and he stated to make an appointment for today. Appointment made for today at 5:20 pm. Son stated he will call back to confirm he can bring pt to the appointment.  Son calling for pt. Pt has had right earache for 2 weeks and is having dizziness. Son denies pt having runny nose or fever. Pt pain is moderate. Pt has existing appointment 10/09/18 at 11:20 with Dr Leretha Pol. Offered to make an appointment today or tomorrow. Son stated he will call back if he can bring her earlier.   Reason for Disposition . Earache  (Exceptions: brief ear pain of < 60 minutes duration, earache occurring during air travel  Answer Assessment - Initial Assessment Questions 1. LOCATION: "Which ear is involved?"     Right ear 2. ONSET: "When did the ear start hurting"      2 weeks ago 3. SEVERITY: "How bad is the pain?"  (Scale 1-10; mild, moderate or severe)   - MILD (1-3): doesn't interfere with normal activities    - MODERATE (4-7): interferes with normal activities or awakens from sleep    - SEVERE (8-10): excruciating pain, unable to do any normal activities      moderate 4. URI SYMPTOMS: " Do you have a runny nose or cough?"     no 5. FEVER: "Do you have a fever?" If so, ask: "What is your temperature, how was it measured, and when did it start?"     no 6. CAUSE: "Have you been swimming recently?", "How often do you use Q-TIPS?", "Have you had any recent air travel or scuba diving?"     No-no 7. OTHER SYMPTOMS: "Do you have any other symptoms?" (e.g., headache, stiff neck, dizziness, vomiting, runny nose, decreased hearing)     dizziness 8. PREGNANCY: "Is there any chance you are pregnant?" "When was your last menstrual period?"     n/a  Protocols used: EARACHE-A-AH

## 2018-10-07 NOTE — Telephone Encounter (Signed)
Son called and cancelled today's appointment with the agent. Would like to know what she do for the ear pain. Instructed she could try some Tylenol regular strength. Verbalizes understanding.

## 2018-10-08 ENCOUNTER — Encounter: Payer: Self-pay | Admitting: Pulmonary Disease

## 2018-10-08 ENCOUNTER — Ambulatory Visit (INDEPENDENT_AMBULATORY_CARE_PROVIDER_SITE_OTHER): Payer: Medicare Other | Admitting: Pulmonary Disease

## 2018-10-08 VITALS — BP 160/90 | HR 67 | Wt 182.0 lb

## 2018-10-08 DIAGNOSIS — J9611 Chronic respiratory failure with hypoxia: Secondary | ICD-10-CM

## 2018-10-08 DIAGNOSIS — I5032 Chronic diastolic (congestive) heart failure: Secondary | ICD-10-CM | POA: Diagnosis not present

## 2018-10-08 DIAGNOSIS — I2721 Secondary pulmonary arterial hypertension: Secondary | ICD-10-CM | POA: Diagnosis not present

## 2018-10-08 MED ORDER — ALBUTEROL SULFATE (2.5 MG/3ML) 0.083% IN NEBU: 2.5000 mg | INHALATION_SOLUTION | RESPIRATORY_TRACT | 5 refills | Status: DC | PRN

## 2018-10-08 NOTE — Patient Instructions (Addendum)
Thank you for visiting Dr. Tonia Brooms at [facility]. Today we recommend the following: No orders of the defined types were placed in this encounter.  Meds ordered this encounter  Medications  . albuterol (PROVENTIL) (2.5 MG/3ML) 0.083% nebulizer solution    Sig: Take 3 mLs (2.5 mg total) by nebulization every 4 (four) hours as needed for wheezing or shortness of breath.    Dispense:  75 mL    Refill:  5   Return in about 6 months (around 04/08/2019).  We are moving our office in November. The new address will be: [address] [employer] [address] Phone: [phone]

## 2018-10-08 NOTE — Progress Notes (Signed)
Synopsis: Referred in November 2019 for pulmonary hypertension, chronic respiratory failure requiring nasal cannula O2 supplement by Myles Lipps, MD  Subjective:   PATIENT ID: Tammy Fernandez GENDER: female DOB: [birth year], MRN: [id number]  Chief Complaint  Patient presents with  . Consult    Consult for COPD and respiratory failure. Just needs to get established with pulmonology.     PMH of pneumonia about 3 years ago, treated in the hospital. She was there for a long time and it took her a long time to recover per the patient.  Patient states that she has a relatively good life.  Is able to do most things she wants.  However when asking about her functional status she does state that she gets significantly short of breath with short of breath getting up from chair walking to the door.  She must walk with assistance assistance.  She is breathless with getting dressed.  Her mMRC is a 4.  She is a lifelong non-smoker.  She also denies illicit drug and alcohol abuse.  When asked about her goals in life and her current care plan.  She was pretty adamant to go back in the hospital if at all possible.  When I mentioned discussions regarding ICU admission or than ever the need for life support if she would ever want this sure that she would never want this.  However family that was in the room would interject and persuade differently.  The family also spent majority of the time talking about all of her medical problems and how they are trying to manage them at home.  The patient seems to be more concerned about eating the right things doing the right things to live longer.  She does not understand why she needs to wear oxygen.  Wants to know if she can get rid of her oxygen.   Past Medical History:  Diagnosis Date  . Anxiety   . Chronic lower back pain   . Constipation    h/o fecal disimpaction on 2017  . CVA (cerebral vascular accident) (HCC) 2015   neg workup, MRI small acute lacunar  infarcts. ASA only  . Depression   . Diastolic CHF, chronic (HCC)    grade I, most recent echo Jan 2019  . Fatty liver   . GERD (gastroesophageal reflux disease)   . Hyperlipidemia   . Hypertension   . Hyponatremia    multiple episodes with encephalopathy, renal thinks SIADH  . Hypothyroidism, unspecified   . Internal hemorrhoid   . NSTEMI (non-ST elevated myocardial infarction) (HCC) 08/28/2016  . Palpitations   . PFO with atrial septal aneurysm    TTE 2015  . Pneumonia 2016  . Severe pulmonary arterial systolic hypertension (HCC) 12/25/2017   per echo, on 1L oxgen via [place]     Family History  Problem Relation Age of Onset  . Stroke Father   . Hypertension Father   . Colon cancer Cousin   . Colon cancer Unknown        Aunt  . Diabetes Unknown        aunt and cousins  . Heart disease Unknown        cousins  . Healthy Daughter   . Healthy Son      Past Surgical History:  Procedure Laterality Date  . ABDOMINAL HYSTERECTOMY    . CATARACT EXTRACTION W/ INTRAOCULAR LENS  IMPLANT, BILATERAL Bilateral   . CESAREAN SECTION    . TEE WITHOUT CARDIOVERSION N/A 01/19/2014  Procedure: TRANSESOPHAGEAL ECHOCARDIOGRAM (TEE);  Surgeon: Laurey Morale, MD;  [facility];  Service: Cardiovascular;  Laterality: N/A;  [name]  . VAGINAL HYSTERECTOMY      Social History   Socioeconomic History  . Marital status: Widowed    Spouse name: Not on file  . Number of children: 7  . Years of education: Not on file  . Highest education level: Not on file  Occupational History  . Not on file  Social Needs  . Financial resource strain: Not on file  . Food insecurity:    Worry: Not on file    Inability: Not on file  . Transportation needs:    Medical: Not on file    Non-medical: Not on file  Tobacco Use  . Smoking status: Never Smoker  . Smokeless tobacco: Former Neurosurgeon    Types: Snuff  . Tobacco comment: "used snuff when I was 19"  Substance and Sexual Activity  .  Alcohol use: No  . Drug use: No  . Sexual activity: Not Currently  Lifestyle  . Physical activity:    Days per week: Not on file    Minutes per session: Not on file  . Stress: Not on file  Relationships  . Social connections:    Talks on phone: Not on file    Gets together: Not on file    Attends religious service: Not on file    Active member of club or organization: Not on file    Attends meetings of clubs or organizations: Not on file    Relationship status: Not on file  . Intimate partner violence:    Fear of current or ex partner: Not on file    Emotionally abused: Not on file    Physically abused: Not on file    Forced sexual activity: Not on file  Other Topics Concern  . Not on file  Social History Narrative   ** Merged History Encounter **       Patient lives at home with her son           Allergies  Allergen Reactions  . Crestor [Rosuvastatin Calcium] Other (See Comments)    Muscle Aches  . Keflex [Cephalexin] Other (See Comments)    dizziness  . Peanut Butter Flavor Hives  . Sulfa Antibiotics Hives  . Cephalexin Other (See Comments)    dizziness  . Sulfa Drugs Cross Reactors Rash     Outpatient Medications Prior to Visit  Medication Sig Dispense Refill  . ALPRAZolam (XANAX) 1 MG tablet TAKE 1 TABLET BY MOUTH TWICE DAILY 60 tablet 3  . amLODipine (NORVASC) 5 MG tablet Take 1 tablet (5 mg total) by mouth daily. 90 tablet 3  . Aspirin 81 MG EC tablet Take 81 mg by mouth daily.    Marland Kitchen azelastine (ASTELIN) 0.1 % nasal spray Place 1 spray into both nostrils 2 (two) times daily. Use in each nostril as directed 30 mL 5  . demeclocycline (DECLOMYCIN) 300 MG tablet TK 1/2 T PO BID 30 tablet 3  . fluconazole (DIFLUCAN) 150 MG tablet Take 1 tablet (150 mg total) by mouth every 3 (three) days. 2 tablet 0  . fluticasone (FLONASE) 50 MCG/ACT nasal spray Place 1 spray into both nostrils 2 (two) times daily. 16 g 5  . levothyroxine (SYNTHROID, LEVOTHROID) 50 MCG tablet  Take 1 tablet (50 mcg total) by mouth daily before breakfast. 30 tablet 5  . lisinopril (PRINIVIL,ZESTRIL) 5 MG tablet Take 2 tablets (10 mg  total) by mouth daily. 60 tablet 5  . meclizine (ANTIVERT) 12.5 MG tablet TAKE 1 TABLET BY MOUTH AS NEEDED FOR DIZZINESS 30 tablet 0  . olopatadine (PATANOL) 0.1 % ophthalmic solution Place 1 drop into both eyes daily as needed for allergies. 5 mL 5  . propranolol ER (INDERAL LA) 60 MG 24 hr capsule TAKE 1 CAPSULE BY MOUTH DAILY 90 capsule 1  . Ascorbic Acid (VITAMIN C PO) Take 1 tablet by mouth daily.    Marland Kitchen omeprazole (PRILOSEC) 20 MG capsule Take 1 capsule (20 mg total) by mouth daily. (Patient not taking: Reported on 10/08/2018) 30 capsule 3   No facility-administered medications prior to visit.     Review of Systems  Constitutional: Positive for malaise/fatigue. Negative for chills, fever and weight loss.  HENT: Negative for hearing loss, sore throat and tinnitus.   Eyes: Negative for blurred vision and double vision.  Respiratory: Positive for shortness of breath and wheezing. Negative for cough, hemoptysis, sputum production and stridor.   Cardiovascular: Negative for chest pain, palpitations, orthopnea, leg swelling and PND.  Gastrointestinal: Negative for abdominal pain, constipation, diarrhea, heartburn, nausea and vomiting.  Genitourinary: Negative for dysuria, hematuria and urgency.  Musculoskeletal: Negative for joint pain and myalgias.  Skin: Negative for itching and rash.  Neurological: Negative for dizziness, tingling, weakness and headaches.  Endo/Heme/Allergies: Negative for environmental allergies. Does not bruise/bleed easily.  Psychiatric/Behavioral: Negative for depression. The patient is not nervous/anxious and does not have insomnia.   All other systems reviewed and are negative.    Objective:  Physical Exam  Constitutional: She is oriented to person, place, and time. She appears well-developed and well-nourished. No distress.    HENT:  Head: Normocephalic and atraumatic.  Mouth/Throat: Oropharynx is clear and moist.  Unable to fully view of the right eardrum.  Does have some wax in the canal.  The upper portion of the eardrum looks full, no significant erythema  Eyes: Pupils are equal, round, and reactive to light. Conjunctivae are normal. No scleral icterus.  Neck: Neck supple. No JVD present. No tracheal deviation present.  Cardiovascular: Normal rate, regular rhythm and intact distal pulses.  No murmur heard. Loud S2 greater than S1  Pulmonary/Chest: Effort normal and breath sounds normal. No accessory muscle usage or stridor. No tachypnea. No respiratory distress. She has no wheezes. She has no rhonchi. She has no rales.  Abdominal: Soft. Bowel sounds are normal. She exhibits no distension. There is no tenderness.  Obese pannus  Musculoskeletal: She exhibits no edema or tenderness.  Lymphadenopathy:    She has no cervical adenopathy.  Neurological: She is alert and oriented to person, place, and time.  Skin: Skin is warm and dry. Capillary refill takes less than 2 seconds. No rash noted.  Psychiatric: She has a normal mood and affect. Her behavior is normal.  Vitals reviewed.    Vitals:   10/08/18 1515 10/08/18 1516  BP:  (!) 160/90  Pulse: 62 67  SpO2: 96% 92%  Weight:  182 lb (82.6 kg)   92% on 2 L nasal cannula BMI Readings from Last 3 Encounters:  10/08/18 33.29 kg/m  09/10/18 32.74 kg/m  08/05/18 32.56 kg/m   Wt Readings from Last 3 Encounters:  10/08/18 182 lb (82.6 kg)  09/10/18 179 lb (81.2 kg)  08/05/18 178 lb (80.7 kg)     CBC    Component Value Date/Time   WBC 6.5 06/25/2018 0858   RBC 4.33 06/25/2018 0858   HGB 12.9 06/25/2018  0858   HGB 12.3 03/25/2018 1602   HCT 41.1 06/25/2018 0858   HCT 39.3 03/25/2018 1602   PLT 264 06/25/2018 0858   PLT 299 03/25/2018 1602   MCV 94.9 06/25/2018 0858   MCV 93 03/25/2018 1602   MCH 29.8 06/25/2018 0858   MCHC 31.4 06/25/2018  0858   RDW 12.9 06/25/2018 0858   RDW 14.7 03/25/2018 1602   LYMPHSABS 3.2 (H) 03/25/2018 1602   MONOABS 0.3 12/19/2017 1912   EOSABS 0.2 03/25/2018 1602   BASOSABS 0.0 03/25/2018 1602    Chest Imaging: 12/21/2017: CT chest Chronic collapse right lower lobe, trace right pleural effusion.  Enlarged pulmonary arteries.  Pulmonary Functions Testing Results: No flowsheet data found.  FeNO: None   Pathology: None   Echocardiogram: 12/20/2017: Right ventricular systolic pressure peak 72 mmHg, severe pulmonary hypertension.  Heart Catheterization: None     Assessment & Plan:   Severe pulmonary arterial systolic hypertension (HCC)  Chronic diastolic CHF (congestive heart failure) (HCC)  Chronic hypoxemic respiratory failure (HCC)  Discussion:  This is a pleasant 84 year old female with NYHA functional class IV symptoms.  Her echocardiogram is consistent with severe pulmonary hypertension.  The true diagnosis of pulmonary hypertension would only, with a right heart catheterization.  The risk versus benefits of completing a right heart catheterization for confirming the diagnosis of pulmonary hypertension in this patient I think would not be beneficial.  She likely has pulmonary hypertension secondary to chronic diastolic heart failure.  Over a long period of time she has had vascular remodeling and has CT radiographic evidence of pulmonary hypertension as well as echocardiographic evidence of pulmonary hypertension.  There are no approved therapies for treatment of pulmonary hypertension secondary to chronic diastolic heart failure.  Therefore, the treatment of this should be focused on management of her chronic diastolic heart failure.  This becomes difficult due to her history of hyponatremia and fluid management.  I believe this is the reason that she is hypoxemic and will likely require oxygen therapy from now on.  Additionally, I do not believe pulmonary function tests at the age of 52  will change management in this patient.  Pulmonary function tests in the elderly are likely to overestimate obstruction.  Her current functional status is an mMRC of 4 and she is a lifelong non-smoker.  After discussing some goals of care in the office today with the patient and patient's family.  I gave them a [address] MOST form to review and discuss among themselves.  I will also inform her primary care provider Dr. Leretha Pol.  I would recommend the patient consider a DNR status as cardiopulmonary resuscitation in the setting of severe pulmonary hypertension will unfortunately worsen outcome and decrease likelihood of success.  Additionally, the patient made comments during the office visit that she would no longer however seems this concept.  Greater than 50% of this patient 60-minute office visit was spent face-to-face discussing the above recommendations and treatment plan.    Current Outpatient Medications:  .  ALPRAZolam (XANAX) 1 MG tablet, TAKE 1 TABLET BY MOUTH TWICE DAILY, Disp: 60 tablet, Rfl: 3 .  amLODipine (NORVASC) 5 MG tablet, Take 1 tablet (5 mg total) by mouth daily., Disp: 90 tablet, Rfl: 3 .  Aspirin 81 MG EC tablet, Take 81 mg by mouth daily., Disp: , Rfl:  .  azelastine (ASTELIN) 0.1 % nasal spray, Place 1 spray into both nostrils 2 (two) times daily. Use in each nostril as directed, Disp: 30 mL,  Rfl: 5 .  demeclocycline (DECLOMYCIN) 300 MG tablet, TK 1/2 T PO BID, Disp: 30 tablet, Rfl: 3 .  fluconazole (DIFLUCAN) 150 MG tablet, Take 1 tablet (150 mg total) by mouth every 3 (three) days., Disp: 2 tablet, Rfl: 0 .  fluticasone (FLONASE) 50 MCG/ACT nasal spray, Place 1 spray into both nostrils 2 (two) times daily., Disp: 16 g, Rfl: 5 .  levothyroxine (SYNTHROID, LEVOTHROID) 50 MCG tablet, Take 1 tablet (50 mcg total) by mouth daily before breakfast., Disp: 30 tablet, Rfl: 5 .  lisinopril (PRINIVIL,ZESTRIL) 5 MG tablet, Take 2 tablets (10 mg total) by mouth daily., Disp:  60 tablet, Rfl: 5 .  meclizine (ANTIVERT) 12.5 MG tablet, TAKE 1 TABLET BY MOUTH AS NEEDED FOR DIZZINESS, Disp: 30 tablet, Rfl: 0 .  olopatadine (PATANOL) 0.1 % ophthalmic solution, Place 1 drop into both eyes daily as needed for allergies., Disp: 5 mL, Rfl: 5 .  propranolol ER (INDERAL LA) 60 MG 24 hr capsule, TAKE 1 CAPSULE BY MOUTH DAILY, Disp: 90 capsule, Rfl: 1 .  albuterol (PROVENTIL) (2.5 MG/3ML) 0.083% nebulizer solution, Take 3 mLs (2.5 mg total) by nebulization every 4 (four) hours as needed for wheezing or shortness of breath., Disp: 75 mL, Rfl: 5   Josephine Igo, DO [facility] 10/08/2018 8:42 PM

## 2018-10-09 ENCOUNTER — Encounter: Payer: Self-pay | Admitting: Family Medicine

## 2018-10-09 ENCOUNTER — Ambulatory Visit (INDEPENDENT_AMBULATORY_CARE_PROVIDER_SITE_OTHER): Payer: Medicare Other | Admitting: Family Medicine

## 2018-10-09 ENCOUNTER — Other Ambulatory Visit: Payer: Self-pay

## 2018-10-09 VITALS — BP 162/76 | HR 56 | Temp 98.0°F | Resp 14 | Ht 62.0 in | Wt 182.0 lb

## 2018-10-09 DIAGNOSIS — J0100 Acute maxillary sinusitis, unspecified: Secondary | ICD-10-CM

## 2018-10-09 DIAGNOSIS — I2721 Secondary pulmonary arterial hypertension: Secondary | ICD-10-CM

## 2018-10-09 DIAGNOSIS — I10 Essential (primary) hypertension: Secondary | ICD-10-CM

## 2018-10-09 DIAGNOSIS — K219 Gastro-esophageal reflux disease without esophagitis: Secondary | ICD-10-CM | POA: Diagnosis not present

## 2018-10-09 DIAGNOSIS — E871 Hypo-osmolality and hyponatremia: Secondary | ICD-10-CM

## 2018-10-09 MED ORDER — AMOXICILLIN-POT CLAVULANATE 875-125 MG PO TABS: 1.0000 | ORAL_TABLET | Freq: Two times a day (BID) | ORAL | 0 refills | Status: DC

## 2018-10-09 MED ORDER — LANSOPRAZOLE 30 MG PO TBDD: 30.0000 mg | DELAYED_RELEASE_TABLET | Freq: Every day | ORAL | 1 refills | Status: DC

## 2018-10-09 NOTE — Patient Instructions (Signed)
° ° ° °  If you have lab work done today you will be contacted with your lab results within the next 2 weeks.  If you have not heard from us then please contact us. The fastest way to get your results is to register for My Chart. ° ° °IF you received an x-ray today, you will receive an invoice from [facility]. Please contact [facility] at [phone] with questions or concerns regarding your invoice.  ° °IF you received labwork today, you will receive an invoice from [facility]. Please contact [facility] at [phone] with questions or concerns regarding your invoice.  ° °Our billing staff will not be able to assist you with questions regarding bills from these companies. ° °You will be contacted with the lab results as soon as they are available. The fastest way to get your results is to activate your My Chart account. Instructions are located on the last page of this paperwork. If you have not heard from us regarding the results in 2 weeks, please contact this office. °  ° ° ° °

## 2018-10-09 NOTE — Progress Notes (Signed)
11/7/201912:41 PM  Tammy Fernandez 09-Aug-1924, 84 y.o. female [id number]  Chief Complaint  Patient presents with  . Ear Pain    right ear pain, dizziness, stopped up, off balance, pain on that whole side of face and heach x 2 wk    HPI:   Patient is a 84 y.o. female with past medical history significant for hyponatremia 2/2 SIADH, constipation, HTN, CHF, pHTN on oxygen, CVA, GERD and seasonal allergieswho presents today for right ear pain  Right ear pain for about 2 weeks Has her very dizzy, feels air makes it worse, has been putting sweet oil Reports minor congestion and drainage, but having lots of PND and coughing Having headache and right sided face pain No fever, but feels hot SOB at baseline, no edema Last sinus infection 4 months ago  Saw pulm yesterday, phtn from [facility] Oxygen dependent  She would like to have portable shoulder canister so that she can walk Worried about tripping over long tubing [facility]  Insurance not paying omeprazole GERD not well controlled at all, having reflux and dysphagia with most meals  Fall Risk  09/05/2018 08/05/2018 07/08/2018 06/10/2018 05/29/2018  Falls in the past year? Yes No No No No  Number falls in past yr: 2 or more - - - -  Injury with Fall? No - - - -  Risk for fall due to : - - - - -     Depression screen [facility] 2/9 09/05/2018 08/05/2018 07/08/2018  Decreased Interest 0 0 0  Down, Depressed, Hopeless 0 0 0  PHQ - 2 Score 0 0 0    Allergies  Allergen Reactions  . Crestor [Rosuvastatin Calcium] Other (See Comments)    Muscle Aches  . Keflex [Cephalexin] Other (See Comments)    dizziness  . Peanut Butter Flavor Hives  . Sulfa Antibiotics Hives  . Cephalexin Other (See Comments)    dizziness  . Sulfa Drugs Cross Reactors Rash    Prior to Admission medications   Medication Sig Start Date End Date Taking? Authorizing Provider  albuterol (PROVENTIL) (2.5 MG/3ML) 0.083% nebulizer solution Take 3 mLs (2.5 mg total) by  nebulization every 4 (four) hours as needed for wheezing or shortness of breath. 10/08/18  Yes Icard, Rachel Bo, DO  ALPRAZolam Prudy Feeler) 1 MG tablet TAKE 1 TABLET BY MOUTH TWICE DAILY 10/02/18  Yes Myles Lipps, MD  amLODipine (NORVASC) 5 MG tablet Take 1 tablet (5 mg total) by mouth daily. 07/08/18  Yes Myles Lipps, MD  Aspirin 81 MG EC tablet Take 81 mg by mouth daily.   Yes [provider]  azelastine (ASTELIN) 0.1 % nasal spray Place 1 spray into both nostrils 2 (two) times daily. Use in each nostril as directed 05/29/18  Yes Myles Lipps, MD  demeclocycline (DECLOMYCIN) 300 MG tablet TK 1/2 T PO BID 08/05/18  Yes Myles Lipps, MD  fluconazole (DIFLUCAN) 150 MG tablet Take 1 tablet (150 mg total) by mouth every 3 (three) days. 07/08/18  Yes Myles Lipps, MD  fluticasone (FLONASE) 50 MCG/ACT nasal spray Place 1 spray into both nostrils 2 (two) times daily. 05/29/18  Yes Myles Lipps, MD  levothyroxine (SYNTHROID, LEVOTHROID) 50 MCG tablet Take 1 tablet (50 mcg total) by mouth daily before breakfast. 09/16/18  Yes Myles Lipps, MD  lisinopril (PRINIVIL,ZESTRIL) 5 MG tablet Take 2 tablets (10 mg total) by mouth daily. 06/25/18  Yes Mesner, Barbara Cower, MD  meclizine (ANTIVERT) 12.5 MG tablet TAKE  1 TABLET BY MOUTH AS NEEDED FOR DIZZINESS 10/02/18  Yes Myles Lipps, MD  olopatadine (PATANOL) 0.1 % ophthalmic solution Place 1 drop into both eyes daily as needed for allergies. 03/25/18  Yes Myles Lipps, MD  propranolol ER (INDERAL LA) 60 MG 24 hr capsule TAKE 1 CAPSULE BY MOUTH DAILY 08/25/18  Yes Myles Lipps, MD    Past Medical History:  Diagnosis Date  . Anxiety   . Chronic lower back pain   . Constipation    h/o fecal disimpaction on 2017  . CVA (cerebral vascular accident) (HCC) 2015   neg workup, MRI small acute lacunar infarcts. ASA only  . Depression   . Diastolic CHF, chronic (HCC)    grade I, most recent echo Jan 2019  . Fatty liver   . GERD  (gastroesophageal reflux disease)   . Hyperlipidemia   . Hypertension   . Hyponatremia    multiple episodes with encephalopathy, renal thinks SIADH  . Hypothyroidism, unspecified   . Internal hemorrhoid   . NSTEMI (non-ST elevated myocardial infarction) (HCC) 08/28/2016  . Palpitations   . PFO with atrial septal aneurysm    TTE 2015  . Pneumonia 2016  . Severe pulmonary arterial systolic hypertension (HCC) 12/25/2017   per echo, on 1L oxgen via [place]    Past Surgical History:  Procedure Laterality Date  . ABDOMINAL HYSTERECTOMY    . CATARACT EXTRACTION W/ INTRAOCULAR LENS  IMPLANT, BILATERAL Bilateral   . CESAREAN SECTION    . TEE WITHOUT CARDIOVERSION N/A 01/19/2014   Procedure: TRANSESOPHAGEAL ECHOCARDIOGRAM (TEE);  Surgeon: Laurey Morale, MD;  Location: [facility] ENDOSCOPY;  Service: Cardiovascular;  Laterality: N/A;  [name]  . VAGINAL HYSTERECTOMY      Social History   Tobacco Use  . Smoking status: Never Smoker  . Smokeless tobacco: Former Neurosurgeon    Types: Snuff  . Tobacco comment: "used snuff when I was 19"  Substance Use Topics  . Alcohol use: No    Family History  Problem Relation Age of Onset  . Stroke Father   . Hypertension Father   . Colon cancer Cousin   . Colon cancer Unknown        Aunt  . Diabetes Unknown        aunt and cousins  . Heart disease Unknown        cousins  . Healthy Daughter   . Healthy Son     ROS Per hpi  OBJECTIVE:  Blood pressure (!) 162/76, pulse (!) 56, temperature 98 F (36.7 C), temperature source Oral, resp. rate 14, height 5\' 2"  (1.575 m), weight 182 lb (82.6 kg), SpO2 97 %. On 1L O2 via [place] Body mass index is 33.29 kg/m.   BP Readings from Last 3 Encounters:  10/09/18 (!) 162/76  10/08/18 (!) 160/90  09/10/18 116/62   Wt Readings from Last 3 Encounters:  10/09/18 182 lb (82.6 kg)  10/08/18 182 lb (82.6 kg)  09/10/18 179 lb (81.2 kg)    Physical Exam  Constitutional: She is oriented to person, place, and time. She  appears well-developed and well-nourished.  HENT:  Head: Normocephalic and atraumatic.  Right Ear: Hearing, tympanic membrane, external ear and ear canal normal.  Left Ear: Hearing, tympanic membrane, external ear and ear canal normal.  Nose: Mucosal edema and rhinorrhea present. Right sinus exhibits maxillary sinus tenderness and frontal sinus tenderness. Left sinus exhibits maxillary sinus tenderness. Left sinus exhibits no frontal sinus tenderness.  Mouth/Throat: Oropharynx is  clear and moist.  Eyes: Pupils are equal, round, and reactive to light. Conjunctivae and EOM are normal.  Neck: Neck supple.  Cardiovascular: Normal rate, regular rhythm and normal heart sounds. Exam reveals no gallop and no friction rub.  No murmur heard. Pulmonary/Chest: Effort normal and breath sounds normal. She has no wheezes. She has no rales.  Musculoskeletal: She exhibits no edema.  Lymphadenopathy:    She has no cervical adenopathy.  Neurological: She is alert and oriented to person, place, and time.  Skin: Skin is warm and dry.  Psychiatric: She has a normal mood and affect.  Nursing note and vitals reviewed. in wheelchair   ASSESSMENT and PLAN  1. Acute non-recurrent maxillary sinusitis Discussed supportive measures, new meds r/se/b and RTC precautions.   2. Hyponatremia Has been stable. She has no showed several renal appts. Will check levels today.  - Basic Metabolic Panel  3. Gastroesophageal reflux disease without esophagitis Not controlled.omeprazole not covered. Sending rx for prevacid.  4. Severe pulmonary arterial systolic hypertension (HCC) Seen by pulm, 2/2 dCHF. Oxygen dependent. Request for safer, more manageable portable oxygen is reasonable. Will order via oxygen supplier.   5. Essential hypertension Not at goal today, previously very well controlled. She reports due to overall pain and sense of malaise. No ssx of fluid overload. Monitor at home. Re-eval at next visit  Other  orders - lansoprazole (PREVACID SOLUTAB) 30 MG disintegrating tablet; Take 1 tablet (30 mg total) by mouth daily at 12 noon. - amoxicillin-clavulanate (AUGMENTIN) 875-125 MG tablet; Take 1 tablet by mouth 2 (two) times daily.  Return in about 4 weeks (around 11/06/2018).    Myles Lipps, MD [facility] [address] [place], [place] [address] Ph.  [phone] Fax [phone]

## 2018-10-10 ENCOUNTER — Telehealth: Payer: Self-pay | Admitting: Family Medicine

## 2018-10-10 LAB — BASIC METABOLIC PANEL
BUN/Creatinine Ratio: 15 (ref 12–28)
BUN: 9 mg/dL — ABNORMAL LOW (ref 10–36)
CO2: 35 mmol/L — ABNORMAL HIGH (ref 20–29)
Calcium: 9.7 mg/dL (ref 8.7–10.3)
Chloride: 84 mmol/L — ABNORMAL LOW (ref 96–106)
Creatinine, Ser: 0.62 mg/dL (ref 0.57–1.00)
GFR calc Af Amer: 91 mL/min/{1.73_m2} (ref >59)
GFR calc non Af Amer: 79 mL/min/{1.73_m2} (ref >59)
Glucose: 91 mg/dL (ref 65–99)
Potassium: 4.8 mmol/L (ref 3.5–5.2)
Sodium: 133 mmol/L — ABNORMAL LOW (ref 134–144)

## 2018-10-10 NOTE — Telephone Encounter (Signed)
I called insurance and they stated we can send an order to any home health agency to get patient an shoulder oxygen canister. So I called [facility] agency and they stated only way patient can qualify for the shoulder canister(simply go mini) patient needs to be able to maintain on a  post dose oxygen only. But if she needs to be on a continuously dose she can only qualify for a simply-go cart. Which is a cart that she pulls alone side of her.

## 2018-10-10 NOTE — Telephone Encounter (Signed)
Please let patient or her son know that the shoulder oxygen canister is NOT appropriate for her oxygen needs. She needs oxygen continuously and the shoulder oxygen tanks provide oxygen intermittently.  thanks

## 2018-10-11 NOTE — Telephone Encounter (Signed)
Left detailed message per ROI advising pt  that the shoulder oxygen canister is NOT appropriate for her oxygen needs. She needs oxygen continuously and the shoulder oxygen tanks provide oxygen intermittently. Advised to call office with any further questions or concerns. Dgaddy, CMA

## 2018-10-14 ENCOUNTER — Other Ambulatory Visit: Payer: Self-pay | Admitting: Family Medicine

## 2018-10-14 ENCOUNTER — Telehealth: Payer: Self-pay | Admitting: Family Medicine

## 2018-10-14 NOTE — Telephone Encounter (Signed)
Pt's son, [name], called to ask about pt's. Lab results from 10/09/18.  Advised will send note to Dr. Leretha PolSantiago, as [facility] has not been [place]. To release these results.

## 2018-10-14 NOTE — Telephone Encounter (Signed)
Prior Auth started for Lansoprazole 30 MG.  The following alternatives are the preferred alternatives: Dexilant, lansoprazole (delayed release) capsules, Nexium (esomeprazole) capsule or Nexium pack, omeprazole, pantoprazole, and rabeprazole. Would you like to switch to the provided preferred alternatives?  Please advise.  Thank you!   KEY: [id number]

## 2018-10-15 ENCOUNTER — Telehealth: Payer: Self-pay

## 2018-10-15 NOTE — Telephone Encounter (Signed)
Spoke with son informed him of his mom's lab results. Pt is is still taking salts tabs in the morning and night. She is requesting  another medication to help with the stomach issue she is having. The prevacid she is not really working well.

## 2018-10-16 NOTE — Telephone Encounter (Signed)
Please advise 

## 2018-10-17 ENCOUNTER — Other Ambulatory Visit: Payer: Self-pay

## 2018-10-17 ENCOUNTER — Telehealth: Payer: Self-pay

## 2018-10-17 ENCOUNTER — Ambulatory Visit: Payer: Self-pay | Admitting: *Deleted

## 2018-10-17 ENCOUNTER — Other Ambulatory Visit: Payer: Self-pay | Admitting: Family Medicine

## 2018-10-17 MED ORDER — FLUCONAZOLE 150 MG PO TABS: 150.0000 mg | ORAL_TABLET | ORAL | 0 refills | Status: DC

## 2018-10-17 NOTE — Telephone Encounter (Signed)
Patient's son called and says his mother needs a refill on Diflucan for yeast infection, since she's on antibiotics. She's been complaining of vaginal itching since last week. He also asked me to call the pharmacy to check on why they can't pick up the Declomycin. [facility] who says a 30 day supply was picked up today and there are no more refills. I called the patient's son Tammy Fernandez back and advised him of what Amy said, he verbalized understanding and asks for a 90 day supply to be sent in from now on for Declomycin. I advised I will send both requests to Dr. Leretha PolSantiago.

## 2018-10-17 NOTE — Telephone Encounter (Signed)
Requested medication (s) are due for refill today: Yes  Requested medication (s) are on the active medication list: Yes  Last refill:  Declomycin 08/05/18; Diflucan 07/08/18  Future visit scheduled: Yes  Notes to clinic:  Unable to refill per protocol. Requesting 90 day supply of Declomycin.     Requested Prescriptions  Pending Prescriptions Disp Refills   demeclocycline (DECLOMYCIN) 300 MG tablet 30 tablet 3    Sig: TK 1/2 T PO BID     Off-Protocol Failed - 10/17/2018  4:40 PM      Failed - Medication not assigned to a protocol, review manually.      Passed - Valid encounter within last 12 months    Recent Outpatient Visits          1 week ago Acute non-recurrent maxillary sinusitis   [facility], Meda CoffeeIrma M, MD   1 month ago Hyponatremia   [facility], Meda CoffeeIrma M, MD   2 months ago Hyponatremia   [facility], Meda CoffeeIrma M, MD   2 months ago Hyponatremia   [facility], Meda CoffeeIrma M, MD   3 months ago Acute non-recurrent maxillary sinusitis   [facility], Meda CoffeeIrma M, MD      Future Appointments            In 3 weeks Myles LippsSantiago, Irma M, MD [facility] [place], [facility]          fluconazole (DIFLUCAN) 150 MG tablet 2 tablet 0    Sig: Take 1 tablet (150 mg total) by mouth every 3 (three) days.     Off-Protocol Failed - 10/17/2018  4:40 PM      Failed - Medication not assigned to a protocol, review manually.      Passed - Valid encounter within last 12 months    Recent Outpatient Visits          1 week ago Acute non-recurrent maxillary sinusitis   [facility], Meda CoffeeIrma M, MD   1 month ago Hyponatremia   [facility], Meda CoffeeIrma M, MD   2 months ago Hyponatremia   [facility], Meda CoffeeIrma M, MD   2 months ago Hyponatremia   [facility], Meda CoffeeIrma M, MD   3 months ago Acute non-recurrent maxillary sinusitis   [facility]  Santiago, Meda CoffeeIrma M, MD      Future Appointments            In 3 weeks Myles LippsSantiago, Irma M, MD [facility]

## 2018-10-17 NOTE — Telephone Encounter (Signed)
Spoke with son [name] an advised diflucan 150 mg #[facility] [address]. [name] advises he will pickup on his way home from work. Dgaddy, CMA

## 2018-10-17 NOTE — Telephone Encounter (Signed)
Patient's son called in reference to the patient's request for medication for yeast infection and to discuss sinus infection not getting better. He says the patient has not been on the antibiotic long enough to clear up the infection and she was only supposed to call about her yeast infection. I asked is she having vaginal itching, discharge. He says she is complaining about it and asked to get something ordered. In the past she took a pill once. I advised Diflucan, he verbalized yes. He also says he will go and make sure she is taking the antibiotic twice a day as ordered, because he has a feeling she's taking it once a day.   See telephone encounter for medication request.

## 2018-10-18 MED ORDER — PANTOPRAZOLE SODIUM 40 MG PO TBEC: 40.0000 mg | DELAYED_RELEASE_TABLET | Freq: Every day | ORAL | 3 refills | Status: DC

## 2018-10-18 NOTE — Telephone Encounter (Signed)
Please do not process PA I sent a prescription in for pantoprazole thanks

## 2018-10-21 NOTE — Telephone Encounter (Signed)
Your pt is requesting medication 

## 2018-10-22 ENCOUNTER — Ambulatory Visit: Payer: Medicare Other

## 2018-10-22 MED ORDER — DEMECLOCYCLINE HCL 300 MG PO TABS: ORAL_TABLET | ORAL | 3 refills | Status: DC

## 2018-10-26 ENCOUNTER — Other Ambulatory Visit: Payer: Self-pay | Admitting: Family Medicine

## 2018-10-27 ENCOUNTER — Ambulatory Visit: Payer: Self-pay

## 2018-10-27 NOTE — Telephone Encounter (Signed)
Pt c/o acid reflux for the past 2 weeks. Pt stated that "food is trying to come up." Pt stated that stomach acid and food comes up into her mouth and she has to spit it out. Pt stated the acid reflux occurs prior to eating. Pt stated that she sleeps in an adjustable bed and she stated that she is sleeping "too flat." Advised pt to elevate bed to a higher position. Pt stated that the reflux improves when she is not in bed. Pt stated she has been drinking ginger tea to help with the reflux. Pt denies any chest pain or abdominal pain. Pt stated that she thought she would feel less bloated if she could belch or have flatus. Care advice given and pt verbalized understanding. Pt stated she is going to have her daughter get some Mylanta to see if that will help.Pt denies dizziness, nausea, vomiting, sweating, fever, difficulty breathing. Pt stated that she has an occasional cough from "my sinuses." Pt has an appointment with Dr Leretha PolSantiago next Friday. Pt asked if there was an appointment that is earlier. No available appointments noted earlier that Friday. Pt refused to see other providers.   Reason for Disposition . [1] Patient claims chest pain is same as previously diagnosed "heartburn" AND [2] describes burning in chest AND [3] accompanying sour taste in mouth    Pt denies chest pain  Answer Assessment - Initial Assessment Questions 1. LOCATION: "Where does it hurt?"   Pt stated that she is not having chest pain. Pt stated that she is having bloating and gas pressure. Pt stated that before she eats, acid and food come up into her mouth and she has to spit it out 2. RADIATION: "Does the pain go anywhere else?" (e.g., into neck, jaw, arms, back)     No chest pain no abdominal pain- denies pain 3. ONSET: "When did the chest pain begin?" (Minutes, hours or days)      Acid reflux began 2 weeks ago.  4. PATTERN "Does the pain come and go, or has it been constant since it started?"  "Does it get worse with  exertion?"      n/a 5. DURATION: "How long does it last" (e.g., seconds, minutes, hours)     n/a 6. SEVERITY: "How bad is the pain?"  (e.g., Scale 1-10; mild, moderate, or severe)    - MILD (1-3): doesn't interfere with normal activities     - MODERATE (4-7): interferes with normal activities or awakens from sleep    - SEVERE (8-10): excruciating pain, unable to do any normal activities       n/a 7. CARDIAC RISK FACTORS: "Do you have any history of heart problems or risk factors for heart disease?" (e.g., prior heart attack, angina; high blood pressure, diabetes, being overweight, high cholesterol, smoking, or strong family history of heart disease)     HTN and CHF 8. PULMONARY RISK FACTORS: "Do you have any history of lung disease?"  (e.g., blood clots in lung, asthma, emphysema, birth control pills)   no 9. CAUSE: "What do you think is causing the chest pain?" Acid refux but pt stated that she is not having chest pain 10. OTHER SYMPTOMS: "Do you have any other symptoms?" (e.g., dizziness, nausea, vomiting, sweating, fever, difficulty breathing, cough) No- occasional cough from sinuses 11. PREGNANCY: "Is there any chance you are pregnant?" "When was your last menstrual period?"  n/a  Protocols used: CHEST PAIN-A-AH

## 2018-10-27 NOTE — Telephone Encounter (Signed)
Requested medication (s) are due for refill today: Yes  Requested medication (s) are on the active medication list: Yes  Last refill:  10/02/18  Future visit scheduled: Yes  Notes to clinic:  Unable to refill per protocol.     Requested Prescriptions  Pending Prescriptions Disp Refills   meclizine (ANTIVERT) 12.5 MG tablet [Pharmacy Med Name: MECLIZINE 12.5MG  (RX) TABLETS] 30 tablet 0    Sig: TAKE 1 TABLET BY MOUTH AS NEEDED FOR DIZZINESS     Not Delegated - Gastroenterology: Antiemetics Failed - 10/27/2018  3:59 PM      Failed - This refill cannot be delegated      Passed - Valid encounter within last 6 months    Recent Outpatient Visits          2 weeks ago Acute non-recurrent maxillary sinusitis   [facility], Meda CoffeeIrma M, MD   1 month ago Hyponatremia   [facility], Meda CoffeeIrma M, MD   2 months ago Hyponatremia   [facility], Meda CoffeeIrma M, MD   2 months ago Hyponatremia   [facility], Meda CoffeeIrma M, MD   3 months ago Acute non-recurrent maxillary sinusitis   [facility], Meda CoffeeIrma M, MD      Future Appointments            In 1 week Myles LippsSantiago, Irma M, MD [facility], [facility]         Refused Prescriptions Disp Refills   propranolol ER (INDERAL LA) 60 MG 24 hr capsule [Pharmacy Med Name: PROPRANOLOL ER 60MG  CAPSULES] 90 capsule 0    Sig: TAKE 1 CAPSULE BY MOUTH DAILY     Cardiovascular:  Beta Blockers Failed - 10/27/2018  3:59 PM      Failed - Last BP in normal range    BP Readings from Last 1 Encounters:  10/09/18 (!) 162/76         Passed - Last Heart Rate in normal range    Pulse Readings from Last 1 Encounters:  10/09/18 (!) 56         Passed - Valid encounter within last 6 months    Recent Outpatient Visits          2 weeks ago Acute non-recurrent maxillary sinusitis   [facility], Meda CoffeeIrma M, MD   1 month ago Hyponatremia   [facility]  Santiago, Meda CoffeeIrma M, MD   2 months ago Hyponatremia   [facility], Meda CoffeeIrma M, MD   2 months ago Hyponatremia   [facility], Meda CoffeeIrma M, MD   3 months ago Acute non-recurrent maxillary sinusitis   [facility], Meda CoffeeIrma M, MD      Future Appointments            In 1 week Myles LippsSantiago, Irma M, MD [facility]

## 2018-10-27 NOTE — Telephone Encounter (Signed)
Patient called and advised she should have enough Propranolol to last until 11/24/18 with 1 refill. She says her bottle is empty. I advised I can submit a 30 day refill, but her insurance may not pay because she received the 90 day refill 2 months ago, she says go ahead and send in the 30 day supply. I advised at the end of the 30 days, there will be 1 refill left of the 90 day sent in on 08/25/18 that she can receive and the insurance will pay for that one, she verbalized understanding.  [facility] and [facility] to Hog[place]Siva, [facility] about the propranolol. He says that on 11/01/18, the patient will be able to pick up a 90 day supply. I advised that she is out and will need at least 5 pills and asked do I submit a refill for 5 pills, he says that he will just refill 5, then she will have to call on the 11/01/18 to request the refill and they will take care of it on their end. I asked if he will call and explain to the patient, he says he will.

## 2018-10-27 NOTE — Telephone Encounter (Signed)
Pt would like 90 day supply 

## 2018-10-31 ENCOUNTER — Other Ambulatory Visit: Payer: Self-pay | Admitting: Family Medicine

## 2018-10-31 DIAGNOSIS — I38 Endocarditis, valve unspecified: Secondary | ICD-10-CM | POA: Diagnosis not present

## 2018-10-31 DIAGNOSIS — J9601 Acute respiratory failure with hypoxia: Secondary | ICD-10-CM | POA: Diagnosis not present

## 2018-11-04 ENCOUNTER — Ambulatory Visit: Payer: Self-pay

## 2018-11-04 NOTE — Telephone Encounter (Signed)
Pt c/o nausea and diarrhea since last Friday. Pt sated that she has had 2 sttols that were loose. Pt denies vomiting, or any rectal bleeding or fever. Pt denies abdominal pain. Pt c/o occasional dizziness. Pt denies dry mouth, too weak to stand. Pt stated she last voided this morning. Pt given home care advice and pt verbalized understanding.   Reason for Disposition . MILD-MODERATE diarrhea (e.g., 1-6 times / day more than normal)  Answer Assessment - Initial Assessment Questions 1. DIARRHEA SEVERITY: "How bad is the diarrhea?" "How many extra stools have you had in the past 24 hours than normal?"    - NO DIARRHEA (SCALE 0)   - MILD (SCALE 1-3): Few loose or mushy BMs; increase of 1-3 stools over normal daily number of stools; mild increase in ostomy output.   -  MODERATE (SCALE 4-7): Increase of 4-6 stools daily over normal; moderate increase in ostomy output. * SEVERE (SCALE 8-10; OR 'WORST POSSIBLE'): Increase of 7 or more stools daily over normal; moderate increase in ostomy output; incontinence.     mild 2. ONSET: "When did the diarrhea begin?"      Last Friday 3. BM CONSISTENCY: "How loose or watery is the diarrhea?"      loose 4. VOMITING: "Are you also vomiting?" If so, ask: "How many times in the past 24 hours?"      nausea 5. ABDOMINAL PAIN: "Are you having any abdominal pain?" If yes: "What does it feel like?" (e.g., crampy, dull, intermittent, constant)      no 6. ABDOMINAL PAIN SEVERITY: If present, ask: "How bad is the pain?"  (e.g., Scale 1-10; mild, moderate, or severe)   - MILD (1-3): doesn't interfere with normal activities, abdomen soft and not tender to touch    - MODERATE (4-7): interferes with normal activities or awakens from sleep, tender to touch    - SEVERE (8-10): excruciating pain, doubled over, unable to do any normal activities       none 7. ORAL INTAKE: If vomiting, "Have you been able to drink liquids?" "How much fluids have you had in the past 24 hours?"   n/a 8. HYDRATION: "Any signs of dehydration?" (e.g., dry mouth [not just dry lips], too weak to stand, dizziness, new weight loss) "When did you last urinate?"     Dizziness, last urinated this morning 9. EXPOSURE: "Have you traveled to a foreign country recently?" "Have you been exposed to anyone with diarrhea?" "Could you have eaten any food that was spoiled?"     No- no-no 10. ANTIBIOTIC USE: "Are you taking antibiotics now or have you taken antibiotics in the past 2 months?"       Took abx last month 11. OTHER SYMPTOMS: "Do you have any other symptoms?" (e.g., fever, blood in stool)   no 12. PREGNANCY: "Is there any chance you are pregnant?" "When was your last menstrual period?"       n/a  Protocols used: DIARRHEA-A-AH

## 2018-11-06 ENCOUNTER — Telehealth: Payer: Self-pay | Admitting: Family Medicine

## 2018-11-06 NOTE — Telephone Encounter (Signed)
Copied from CRM [phone]. Topic: Quick Communication - Rx Refill/Question >> Nov 06, 2018  8:59 AM Jaquita Rectoravis, Karen A wrote: Medication: fluticasone (FLONASE) 50 MCG/ACT nasal spray, meclizine (ANTIVERT) 12.5 MG tablet, propranolol ER (INDERAL LA) 60 MG 24 hr capsule  Per pharmacist patient paid cash for 5 propranolol ER (INDERAL LA) 60 MG 24 hr capsule in November so Rx was voided need new Rx. meclizine (ANTIVERT) 12.5 MG tablet Rx not received on 10/31/18  Has the patient contacted their pharmacy? Yes.   (Agent: If no, request that the patient contact the pharmacy for the refill.) (Agent: If yes, when and what did the pharmacy advise?)  Preferred Pharmacy (with phone number or street name): [id number] - [place], [place] - [address] AT [facility] [address] [facility] [address] [phone] ([phone] (Fax)     Agent: Please be advised that RX refills may take up to 3 business days. We ask that you follow-up with your pharmacy.

## 2018-11-06 NOTE — Telephone Encounter (Signed)
Per pharmacist patient paid cash for 5 propranolol ER (INDERAL LA) 60 MG 24 hr capsule in November so Rx was voided need new Rx. meclizine (ANTIVERT) 12.5 MG tablet Rx not received on 10/31/18.

## 2018-11-07 ENCOUNTER — Ambulatory Visit (INDEPENDENT_AMBULATORY_CARE_PROVIDER_SITE_OTHER): Payer: Medicare Other | Admitting: Family Medicine

## 2018-11-07 ENCOUNTER — Other Ambulatory Visit: Payer: Self-pay | Admitting: Family Medicine

## 2018-11-07 ENCOUNTER — Ambulatory Visit: Payer: Medicare Other | Admitting: Family Medicine

## 2018-11-07 DIAGNOSIS — R42 Dizziness and giddiness: Secondary | ICD-10-CM

## 2018-11-07 DIAGNOSIS — E871 Hypo-osmolality and hyponatremia: Secondary | ICD-10-CM

## 2018-11-07 MED ORDER — MECLIZINE HCL 12.5 MG PO TABS: ORAL_TABLET | ORAL | 0 refills | Status: DC

## 2018-11-07 NOTE — Telephone Encounter (Signed)
Pt missed appt today and she is running out of these medications.  Pt's son states he will call in Monday to get an appt on Tuesday the 10th. Please advise, thank you!

## 2018-11-07 NOTE — Progress Notes (Signed)
Lab visit

## 2018-11-07 NOTE — Telephone Encounter (Signed)
Pt's son also states that she is out of meclizine and levothyroxine

## 2018-11-08 LAB — BASIC METABOLIC PANEL
BUN/Creatinine Ratio: 21 (ref 12–28)
BUN: 14 mg/dL (ref 10–36)
CO2: 38 mmol/L (ref 20–29)
Calcium: 9.4 mg/dL (ref 8.7–10.3)
Chloride: 85 mmol/L — ABNORMAL LOW (ref 96–106)
Creatinine, Ser: 0.68 mg/dL (ref 0.57–1.00)
GFR calc Af Amer: 88 mL/min/{1.73_m2} (ref >59)
GFR calc non Af Amer: 76 mL/min/{1.73_m2} (ref >59)
Glucose: 129 mg/dL — ABNORMAL HIGH (ref 65–99)
Potassium: 4.5 mmol/L (ref 3.5–5.2)
Sodium: 134 mmol/L (ref 134–144)

## 2018-11-11 ENCOUNTER — Telehealth: Payer: Self-pay | Admitting: Family Medicine

## 2018-11-11 ENCOUNTER — Other Ambulatory Visit: Payer: Self-pay | Admitting: Family Medicine

## 2018-11-11 NOTE — Telephone Encounter (Unsigned)
Copied from CRM [id number] [id number]. Topic: Quick Communication - Rx Refill/Question >> Nov 11, 2018 11:30 AM Gaynelle AduPoole, Shalonda wrote: Medication:propranolol ER (INDERAL LA) 60 MG 24 hr capsule, fluticasone (FLONASE) 50 MCG/ACT nasal spray    Has the patient contacted their pharmacy? yes  Preferred Pharmacy (with phone number or street name): [id number] - [place], [place] - [address] [facility] [address] [facility] [address] [phone] (Phone) [phone] (Fax)    Agent: Please be advised that RX refills may take up to 3 business days. We ask that you follow-up with your pharmacy.

## 2018-11-11 NOTE — Telephone Encounter (Unsigned)
Copied from CRM [id number]. Topic: General - Other >> Nov 11, 2018 11:37 AM Gaynelle AduPoole, Shalonda wrote: Reason for CRM: patient is requesting lab result  from 11-07-18. Please advise

## 2018-11-12 NOTE — Telephone Encounter (Signed)
[name] is calling again to f/u on medication requests for propanolol - pt is now out of medicine. She also needs Flonase. Only the meclizine has been sent to the pharmacy and [name] is very frustrated stating he will walk into the office if not resolved. Please advise if another MD can refill her medications. [name] wants a call back [phone].   [facility][id number] - [place], [place] - [address] AT [address]

## 2018-11-13 ENCOUNTER — Other Ambulatory Visit: Payer: Self-pay | Admitting: Family Medicine

## 2018-11-13 MED ORDER — FLUTICASONE PROPIONATE 50 MCG/ACT NA SUSP: 1.0000 | Freq: Two times a day (BID) | NASAL | 5 refills | Status: DC

## 2018-11-14 NOTE — Telephone Encounter (Signed)
Patient son was given results on 11/13/18.

## 2018-11-14 NOTE — Telephone Encounter (Signed)
Patient's concern/request has been addressed already by another provider or staff member. 

## 2018-11-15 ENCOUNTER — Ambulatory Visit: Payer: Medicare Other | Admitting: Family Medicine

## 2018-11-27 ENCOUNTER — Ambulatory Visit: Payer: Medicare Other | Admitting: Family Medicine

## 2018-11-30 DIAGNOSIS — I38 Endocarditis, valve unspecified: Secondary | ICD-10-CM | POA: Diagnosis not present

## 2018-11-30 DIAGNOSIS — J9601 Acute respiratory failure with hypoxia: Secondary | ICD-10-CM | POA: Diagnosis not present

## 2018-12-04 ENCOUNTER — Encounter: Payer: Self-pay | Admitting: Emergency Medicine

## 2018-12-04 ENCOUNTER — Ambulatory Visit (INDEPENDENT_AMBULATORY_CARE_PROVIDER_SITE_OTHER): Payer: Medicare Other | Admitting: Family Medicine

## 2018-12-04 ENCOUNTER — Other Ambulatory Visit: Payer: Self-pay

## 2018-12-04 VITALS — BP 137/52 | HR 70 | Temp 98.6°F | Resp 18

## 2018-12-04 DIAGNOSIS — R05 Cough: Secondary | ICD-10-CM

## 2018-12-04 DIAGNOSIS — K219 Gastro-esophageal reflux disease without esophagitis: Secondary | ICD-10-CM

## 2018-12-04 DIAGNOSIS — R1310 Dysphagia, unspecified: Secondary | ICD-10-CM | POA: Diagnosis not present

## 2018-12-04 DIAGNOSIS — E871 Hypo-osmolality and hyponatremia: Secondary | ICD-10-CM

## 2018-12-04 DIAGNOSIS — R059 Cough, unspecified: Secondary | ICD-10-CM

## 2018-12-04 MED ORDER — PANTOPRAZOLE SODIUM 40 MG PO TBEC: 40.0000 mg | DELAYED_RELEASE_TABLET | Freq: Every day | ORAL | 3 refills | Status: DC

## 2018-12-04 MED ORDER — DEMECLOCYCLINE HCL 300 MG PO TABS: ORAL_TABLET | ORAL | 3 refills | Status: DC

## 2018-12-04 NOTE — Progress Notes (Signed)
1/2/202012:13 PM  KERRI-ANNE HEAGNEY May 26, 1924, 85 y.o. female [id number]  Chief Complaint  Patient presents with  . Cough    started 12/03/2018 nonproductive    HPI:   Patient is a 85 y.o. female with past medical history significant for hyponatremia2/2 SIADH, constipation, HTN, CHF, pHTN on oxygen, CVA, GERD and seasonal allergieswho presents today for cough  Coughing for past several days, mostly around eating times Then last night was really bad, having worsening heartburn Taking ranitidine at bedtime and protonix in the morning Has been changing diet, chopped, softer Son has concerns that she is aspirating  O2 at baseline 2L, at night her oxygen cord is very long, O2 sats 88-89% During the day high 90s  Has been sipping bendaryl due to this recurrent cough  Has been only doing lisinopril 5mg  once a day, instead of 10mg   Lab Results  Component Value Date   NA 134 11/07/2018   K 4.5 11/07/2018   CL 85 (L) 11/07/2018   CO2 38 (HH) 11/07/2018   Lab Results  Component Value Date   CREATININE 0.68 11/07/2018    Fall Risk  12/04/2018 09/05/2018 08/05/2018 07/08/2018 06/10/2018  Falls in the past year? 1 Yes No No No  Number falls in past yr: 1 2 or more - - -  Injury with Fall? 0 No - - -  Risk for fall due to : - - - - -     Depression screen [facility] 2/9 12/04/2018 09/05/2018 08/05/2018  Decreased Interest 0 0 0  Down, Depressed, Hopeless 0 0 0  PHQ - 2 Score 0 0 0    Allergies  Allergen Reactions  . Crestor [Rosuvastatin Calcium] Other (See Comments)    Muscle Aches  . Keflex [Cephalexin] Other (See Comments)    dizziness  . Peanut Butter Flavor Hives  . Sulfa Antibiotics Hives  . Cephalexin Other (See Comments)    dizziness  . Sulfa Drugs Cross Reactors Rash    Prior to Admission medications   Medication Sig Start Date End Date Taking? Authorizing Provider  albuterol (PROVENTIL) (2.5 MG/3ML) 0.083% nebulizer solution Take 3 mLs (2.5 mg total) by nebulization every 4  (four) hours as needed for wheezing or shortness of breath. 10/08/18  Yes Icard, Rachel Bo, DO  ALPRAZolam Prudy Feeler) 1 MG tablet TAKE 1 TABLET BY MOUTH TWICE DAILY 10/02/18  Yes Myles Lipps, MD  Aspirin 81 MG EC tablet Take 81 mg by mouth daily.   Yes [provider]  azelastine (ASTELIN) 0.1 % nasal spray Place 1 spray into both nostrils 2 (two) times daily. Use in each nostril as directed 05/29/18  Yes Myles Lipps, MD  demeclocycline (DECLOMYCIN) 300 MG tablet TK 1/2 T PO BID 10/22/18  Yes Myles Lipps, MD  fluticasone [facility]) 50 MCG/ACT nasal spray Place 1 spray into both nostrils 2 (two) times daily. 11/13/18  Yes Myles Lipps, MD  levothyroxine (SYNTHROID, LEVOTHROID) 50 MCG tablet Take 1 tablet (50 mcg total) by mouth daily before breakfast. 09/16/18  Yes Myles Lipps, MD  lisinopril (PRINIVIL,ZESTRIL) 5 MG tablet Take 2 tablets (10 mg total) by mouth daily. 06/25/18  Yes Mesner, Barbara Cower, MD  meclizine (ANTIVERT) 12.5 MG tablet Take 1 tablet by mouth as needed for dizziness 11/07/18  Yes Myles Lipps, MD  olopatadine (PATANOL) 0.1 % ophthalmic solution Place 1 drop into both eyes daily as needed for allergies. 03/25/18  Yes Myles Lipps, MD  pantoprazole (PROTONIX) 40 MG tablet  Take 1 tablet (40 mg total) by mouth daily. 10/18/18  Yes Myles LippsSantiago, Dniyah Grant M, MD  propranolol ER (INDERAL LA) 60 MG 24 hr capsule TAKE 1 CAPSULE BY MOUTH DAILY 11/13/18  Yes Myles LippsSantiago, Lamaya Hyneman M, MD  amLODipine (NORVASC) 5 MG tablet Take 1 tablet (5 mg total) by mouth daily. Patient not taking: Reported on 12/04/2018 07/08/18   Myles LippsSantiago, Deklynn Charlet M, MD  fluconazole (DIFLUCAN) 150 MG tablet Take 1 tablet (150 mg total) by mouth every 3 (three) days. Patient not taking: Reported on 12/04/2018 10/17/18   Myles LippsSantiago, Carin Shipp M, MD    Past Medical History:  Diagnosis Date  . Anxiety   . Chronic lower back pain   . Constipation    h/o fecal disimpaction on 2017  . CVA (cerebral vascular accident) (HCC) 2015     neg workup, MRI small acute lacunar infarcts. ASA only  . Depression   . Diastolic CHF, chronic (HCC)    grade I, most recent echo Jan 2019  . Fatty liver   . GERD (gastroesophageal reflux disease)   . Hyperlipidemia   . Hypertension   . Hyponatremia    multiple episodes with encephalopathy, renal thinks SIADH  . Hypothyroidism, unspecified   . Internal hemorrhoid   . NSTEMI (non-ST elevated myocardial infarction) (HCC) 08/28/2016  . Palpitations   . PFO with atrial septal aneurysm    TTE 2015  . Pneumonia 2016  . Severe pulmonary arterial systolic hypertension (HCC) 12/25/2017   per echo, on 1L oxgen via [place]    Past Surgical History:  Procedure Laterality Date  . ABDOMINAL HYSTERECTOMY    . CATARACT EXTRACTION W/ INTRAOCULAR LENS  IMPLANT, BILATERAL Bilateral   . CESAREAN SECTION    . TEE WITHOUT CARDIOVERSION N/A 01/19/2014   Procedure: TRANSESOPHAGEAL ECHOCARDIOGRAM (TEE);  Surgeon: Laurey Moralealton S McLean, MD;  [facility];  Service: Cardiovascular;  Laterality: N/A;  [name]/ja  . VAGINAL HYSTERECTOMY      Social History   Tobacco Use  . Smoking status: Never Smoker  . Smokeless tobacco: Former NeurosurgeonUser    Types: Snuff  . Tobacco comment: "used snuff when I was 19"  Substance Use Topics  . Alcohol use: No    Family History  Problem Relation Age of Onset  . Stroke Father   . Hypertension Father   . Colon cancer Cousin   . Colon cancer Unknown        Aunt  . Diabetes Unknown        aunt and cousins  . Heart disease Unknown        cousins  . Healthy Daughter   . Healthy Son     ROS Per hpi  OBJECTIVE:  Blood pressure (!) 137/52, pulse 70, temperature 98.6 F (37 C), temperature source Oral, resp. rate 18, SpO2 98 %. There is no height or weight on file to calculate BMI.   Physical Exam Vitals signs and nursing note reviewed.  Constitutional:      Appearance: She is well-developed.  HENT:     Head: Normocephalic and atraumatic.     Mouth/Throat:      Pharynx: No oropharyngeal exudate.  Eyes:     General: No scleral icterus.    Conjunctiva/sclera: Conjunctivae normal.     Pupils: Pupils are equal, round, and reactive to light.  Neck:     Musculoskeletal: Neck supple.  Cardiovascular:     Rate and Rhythm: Normal rate and regular rhythm.     Heart sounds: Normal heart sounds.  No murmur. No friction rub. No gallop.   Pulmonary:     Effort: Pulmonary effort is normal.     Breath sounds: Normal breath sounds. No wheezing or rales.  Skin:    General: Skin is warm and dry.  Neurological:     Mental Status: She is alert and oriented to person, place, and time.     ASSESSMENT and PLAN  1. Cough Related mostly to eating. ? Aspiration vs reflux. Will start with swallow eval. Discussed routine dysphagia precautions. Discussed GERD diet, handout given. Consider GI referral. Also discussed concerns for overuse of benadryl.  - SLP modified barium swallow; Future  2. Gastroesophageal reflux disease without esophagitis - SLP modified barium swallow; Future  3. Dysphagia, unspecified type - SLP modified barium swallow; Future  4. Hyponatremia - Comprehensive metabolic panel  Other orders - pantoprazole (PROTONIX) 40 MG tablet; Take 1 tablet (40 mg total) by mouth daily. - demeclocycline (DECLOMYCIN) 300 MG tablet; TK 1/2 T PO BID  Return in about 4 weeks (around 01/01/2019).    Myles LippsIrma M Santiago, MD [facility] [address] [place], [place] [address] Ph.  [phone] Fax [phone]

## 2018-12-04 NOTE — Patient Instructions (Signed)
° ° ° °  If you have lab work done today you will be contacted with your lab results within the next 2 weeks.  If you have not heard from us then please contact us. The fastest way to get your results is to register for My Chart. ° ° °IF you received an x-ray today, you will receive an invoice from [facility]. Please contact [facility] at [phone] with questions or concerns regarding your invoice.  ° °IF you received labwork today, you will receive an invoice from [facility]. Please contact [facility] at [phone] with questions or concerns regarding your invoice.  ° °Our billing staff will not be able to assist you with questions regarding bills from these companies. ° °You will be contacted with the lab results as soon as they are available. The fastest way to get your results is to activate your My Chart account. Instructions are located on the last page of this paperwork. If you have not heard from us regarding the results in 2 weeks, please contact this office. °  ° ° ° °

## 2018-12-05 ENCOUNTER — Encounter: Payer: Self-pay | Admitting: Family Medicine

## 2018-12-05 LAB — COMPREHENSIVE METABOLIC PANEL
ALT: 6 IU/L (ref 0–32)
AST: 17 IU/L (ref 0–40)
Albumin/Globulin Ratio: 1.1 — ABNORMAL LOW (ref 1.2–2.2)
Albumin: 3.4 g/dL (ref 3.2–4.6)
Alkaline Phosphatase: 92 IU/L (ref 39–117)
BUN/Creatinine Ratio: 29 — ABNORMAL HIGH (ref 12–28)
BUN: 16 mg/dL (ref 10–36)
Bilirubin Total: 0.2 mg/dL (ref 0.0–1.2)
CO2: 37 mmol/L (ref 20–29)
Calcium: 10.1 mg/dL (ref 8.7–10.3)
Chloride: 86 mmol/L — ABNORMAL LOW (ref 96–106)
Creatinine, Ser: 0.56 mg/dL — ABNORMAL LOW (ref 0.57–1.00)
GFR calc Af Amer: 94 mL/min/{1.73_m2} (ref >59)
GFR calc non Af Amer: 81 mL/min/{1.73_m2} (ref >59)
Globulin, Total: 3.2 g/dL (ref 1.5–4.5)
Glucose: 97 mg/dL (ref 65–99)
Potassium: 4.7 mmol/L (ref 3.5–5.2)
Sodium: 140 mmol/L (ref 134–144)
Total Protein: 6.6 g/dL (ref 6.0–8.5)

## 2018-12-08 ENCOUNTER — Encounter

## 2018-12-08 ENCOUNTER — Other Ambulatory Visit (HOSPITAL_COMMUNITY): Payer: Self-pay

## 2018-12-08 ENCOUNTER — Ambulatory Visit: Payer: Self-pay | Admitting: Family Medicine

## 2018-12-08 DIAGNOSIS — R1310 Dysphagia, unspecified: Secondary | ICD-10-CM

## 2018-12-10 ENCOUNTER — Ambulatory Visit: Payer: Self-pay

## 2018-12-10 NOTE — Telephone Encounter (Signed)
Pt's son [name] called to verify dosage and frequency of pt's pantoprazole and ranitidine.  Pt is currently taking the pantoprazole in the morning and the zantac in the evening.  Informed son that pt is taking the pantoprazole correctly.  Ranitidine is not on active med list. Son is requesting refill of Zantac. Routing to office   Reason for Disposition . Caller has medication question only, adult not sick, and triager answers question  Answer Assessment - Initial Assessment Questions 1. SYMPTOMS: "Do you have any symptoms?"     n/a 2. SEVERITY: If symptoms are present, ask "Are they mild, moderate or severe?" n/a  Protocols used: MEDICATION QUESTION CALL-A-AH

## 2018-12-16 ENCOUNTER — Other Ambulatory Visit: Payer: Self-pay

## 2018-12-16 MED ORDER — FAMOTIDINE 20 MG PO TABS: 20.0000 mg | ORAL_TABLET | Freq: Every day | ORAL | 0 refills | Status: DC

## 2018-12-16 NOTE — Telephone Encounter (Signed)
Informed pt son that Rx has been sent.

## 2018-12-16 NOTE — Telephone Encounter (Signed)
Spoke with son and informed him that the medication he was requesting has been recalled and will speak with provider to see what else she may want to sent to pharmacy.

## 2018-12-19 ENCOUNTER — Ambulatory Visit: Payer: Self-pay

## 2018-12-19 NOTE — Telephone Encounter (Signed)
Pt.'s case Production designer, theatre/television/film from [facility] [place] called.Left message she had spoken with pt.'s son and pt. was sleeping more, still coughing. Called and spoke with pt. And son. She was sitting up eating lunch. She had just finished a nebulizer treatment, 02 sat 98%, pulse 66. Son admits that he has been giving pt. "a whole pill of anxiety pill instead of 1/2." States she seemed to be "sleeping more the past 2 days." Spoke with pt. And she sounds alert and oriented. Appointment made for tomorrow as requested.  Reason for Disposition . Cough has been present for > 3 weeks  Answer Assessment - Initial Assessment Questions 1. ONSET: "When did the cough begin?"      About a month 2. SEVERITY: "How bad is the cough today?"      Mild 3. RESPIRATORY DISTRESS: "Describe your breathing."      No shortness of breath 4. FEVER: "Do you have a fever?" If so, ask: "What is your temperature, how was it measured, and when did it start?"     No 5. HEMOPTYSIS: "Are you coughing up any blood?" If so ask: "How much?" (flecks, streaks, tablespoons, etc.)     No 6. TREATMENT: "What have you done so far to treat the cough?" (e.g., meds, fluids, humidifier)     Nebulizer 7. CARDIAC HISTORY: "Do you have any history of heart disease?" (e.g., heart attack, congestive heart failure)      CHF 8. LUNG HISTORY: "Do you have any history of lung disease?"  (e.g., pulmonary embolus, asthma, emphysema)     Allergies 9. PE RISK FACTORS: "Do you have a history of blood clots?" (or: recent major surgery, recent prolonged travel, bedridden)     No 10. OTHER SYMPTOMS: "Do you have any other symptoms? (e.g., runny nose, wheezing, chest pain)       No 11. PREGNANCY: "Is there any chance you are pregnant?" "When was your last menstrual period?"       No 12. TRAVEL: "Have you traveled out of the country in the last month?" (e.g., travel history, exposures)       No  Protocols used: COUGH - ACUTE NON-PRODUCTIVE-A-AH

## 2018-12-20 ENCOUNTER — Ambulatory Visit (INDEPENDENT_AMBULATORY_CARE_PROVIDER_SITE_OTHER): Payer: Medicare Other | Admitting: Family Medicine

## 2018-12-20 ENCOUNTER — Encounter: Payer: Self-pay | Admitting: Family Medicine

## 2018-12-20 VITALS — BP 142/68 | HR 68 | Temp 98.9°F

## 2018-12-20 DIAGNOSIS — I10 Essential (primary) hypertension: Secondary | ICD-10-CM

## 2018-12-20 DIAGNOSIS — R05 Cough: Secondary | ICD-10-CM | POA: Diagnosis not present

## 2018-12-20 DIAGNOSIS — R490 Dysphonia: Secondary | ICD-10-CM | POA: Diagnosis not present

## 2018-12-20 DIAGNOSIS — R5381 Other malaise: Secondary | ICD-10-CM | POA: Diagnosis not present

## 2018-12-20 DIAGNOSIS — Z9181 History of falling: Secondary | ICD-10-CM | POA: Diagnosis not present

## 2018-12-20 DIAGNOSIS — R059 Cough, unspecified: Secondary | ICD-10-CM

## 2018-12-20 DIAGNOSIS — R42 Dizziness and giddiness: Secondary | ICD-10-CM

## 2018-12-20 MED ORDER — MECLIZINE HCL 12.5 MG PO TABS: ORAL_TABLET | ORAL | 0 refills | Status: DC

## 2018-12-20 MED ORDER — FLUCONAZOLE 150 MG PO TABS: 150.0000 mg | ORAL_TABLET | ORAL | 0 refills | Status: DC

## 2018-12-20 NOTE — Patient Instructions (Addendum)
  Follow up with Dr. Leretha Pol next week for cough if there is no improvement    If you have lab work done today you will be contacted with your lab results within the next 2 weeks.  If you have not heard from [place] then please contact us. The fastest way to get your results is to register for My Chart.   IF you received an x-ray today, you will receive an invoice from [facility]. Please contact [facility] at [phone] with questions or concerns regarding your invoice.   IF you received labwork today, you will receive an invoice from [facility]. Please contact [facility] at [phone] with questions or concerns regarding your invoice.   Our billing staff will not be able to assist you with questions regarding bills from these companies.  You will be contacted with the lab results as soon as they are available. The fastest way to get your results is to activate your My Chart account. Instructions are located on the last page of this paperwork. If you have not heard from [place] regarding the results in 2 weeks, please contact this office.

## 2018-12-20 NOTE — Progress Notes (Signed)
Established Patient Office Visit  Subjective:  Patient ID: Tammy Fernandez, female    DOB: 1923/12/31  Age: 85 y.o. MRN: [id number]  CC:  Chief Complaint  Patient presents with  . Cough  . Medication Refill    meclizine    HPI [name] presents for   Evaluation of hoarseness and cough It has improved today She is here with her son  She has been able to get  She completed abx augmentin  She reports some vaginal itching after antibiotic Her son wanted to just have her checked out   She has become afraid of falling since her last fall in the latter part of last year and she is now afraid of falling Her son wanted to know if she can get some assistance since she has been in the bed for 2 months They would like to get her some assistance to help her move her around She states [facility] and they would like to continue those services.    Hypertension: Patient here for follow-up of elevated blood pressure. Her blood pressure is typically fluctuating and is sometimes 160 She has to work harder to get around and only exercises by walking around the house She took her bp medications this morning   Past Medical History:  Diagnosis Date  . Anxiety   . Chronic lower back pain   . Constipation    h/o fecal disimpaction on 2017  . CVA (cerebral vascular accident) (HCC) 2015   neg workup, MRI small acute lacunar infarcts. ASA only  . Depression   . Diastolic CHF, chronic (HCC)    grade I, most recent echo Jan 2019  . Fatty liver   . GERD (gastroesophageal reflux disease)   . Hyperlipidemia   . Hypertension   . Hyponatremia    multiple episodes with encephalopathy, renal thinks SIADH  . Hypothyroidism, unspecified   . Internal hemorrhoid   . NSTEMI (non-ST elevated myocardial infarction) (HCC) 08/28/2016  . Palpitations   . PFO with atrial septal aneurysm    TTE 2015  . Pneumonia 2016  . Severe pulmonary arterial systolic  hypertension (HCC) 12/25/2017   per echo, on 1L oxgen via [place]    Past Surgical History:  Procedure Laterality Date  . ABDOMINAL HYSTERECTOMY    . CATARACT EXTRACTION W/ INTRAOCULAR LENS  IMPLANT, BILATERAL Bilateral   . CESAREAN SECTION    . TEE WITHOUT CARDIOVERSION N/A 01/19/2014   Procedure: TRANSESOPHAGEAL ECHOCARDIOGRAM (TEE);  Surgeon: Laurey Moralealton S McLean, MD;  [facility];  Service: Cardiovascular;  Laterality: N/A;  [name]/ja  . VAGINAL HYSTERECTOMY      Family History  Problem Relation Age of Onset  . Stroke Father   . Hypertension Father   . Colon cancer Cousin   . Colon cancer Unknown        Aunt  . Diabetes Unknown        aunt and cousins  . Heart disease Unknown        cousins  . Healthy Daughter   . Healthy Son     Social History   Socioeconomic History  . Marital status: Widowed    Spouse name: Not on file  . Number of children: 7  . Years of education: Not on file  . Highest education level: Not on file  Occupational History  . Not on file  Social Needs  . Financial resource strain: Not on file  .  Food insecurity:    Worry: Not on file    Inability: Not on file  . Transportation needs:    Medical: Not on file    Non-medical: Not on file  Tobacco Use  . Smoking status: Never Smoker  . Smokeless tobacco: Former Neurosurgeon    Types: Snuff  . Tobacco comment: "used snuff when I was 19"  Substance and Sexual Activity  . Alcohol use: No  . Drug use: No  . Sexual activity: Not Currently  Lifestyle  . Physical activity:    Days per week: Not on file    Minutes per session: Not on file  . Stress: Not on file  Relationships  . Social connections:    Talks on phone: Not on file    Gets together: Not on file    Attends religious service: Not on file    Active member of club or organization: Not on file    Attends meetings of clubs or organizations: Not on file    Relationship status: Not on file  . Intimate partner violence:    Fear of current  or ex partner: Not on file    Emotionally abused: Not on file    Physically abused: Not on file    Forced sexual activity: Not on file  Other Topics Concern  . Not on file  Social History Narrative   ** Merged History Encounter **       Patient lives at home with her son          Outpatient Medications Prior to Visit  Medication Sig Dispense Refill  . albuterol (PROVENTIL) (2.5 MG/3ML) 0.083% nebulizer solution Take 3 mLs (2.5 mg total) by nebulization every 4 (four) hours as needed for wheezing or shortness of breath. 75 mL 5  . ALPRAZolam (XANAX) 1 MG tablet TAKE 1 TABLET BY MOUTH TWICE DAILY 60 tablet 3  . Aspirin 81 MG EC tablet Take 81 mg by mouth daily.    Marland Kitchen azelastine (ASTELIN) 0.1 % nasal spray Place 1 spray into both nostrils 2 (two) times daily. Use in each nostril as directed 30 mL 5  . demeclocycline (DECLOMYCIN) 300 MG tablet TK 1/2 T PO BID 30 tablet 3  . fluticasone (FLONASE) 50 MCG/ACT nasal spray Place 1 spray into both nostrils 2 (two) times daily. 16 g 5  . levothyroxine (SYNTHROID, LEVOTHROID) 50 MCG tablet Take 1 tablet (50 mcg total) by mouth daily before breakfast. 30 tablet 5  . lisinopril (PRINIVIL,ZESTRIL) 5 MG tablet Take 2 tablets (10 mg total) by mouth daily. 60 tablet 5  . olopatadine (PATANOL) 0.1 % ophthalmic solution Place 1 drop into both eyes daily as needed for allergies. 5 mL 5  . omeprazole (PRILOSEC) 20 MG capsule     . pantoprazole (PROTONIX) 40 MG tablet Take 1 tablet (40 mg total) by mouth daily. 30 tablet 3  . propranolol ER (INDERAL LA) 60 MG 24 hr capsule TAKE 1 CAPSULE BY MOUTH DAILY 90 capsule 0  . ranitidine (ZANTAC) 300 MG tablet TK 1 T PO HS  1  . meclizine (ANTIVERT) 12.5 MG tablet Take 1 tablet by mouth as needed for dizziness 30 tablet 0  . amLODipine (NORVASC) 5 MG tablet Take 1 tablet (5 mg total) by mouth daily. (Patient not taking: Reported on 12/20/2018) 90 tablet 3  . famotidine (PEPCID) 20 MG tablet Take 1 tablet (20 mg total)  by mouth daily. Take 1 tablet at Bedtime daily. (Patient not taking: Reported on 12/20/2018)  90 tablet 0  . fluconazole (DIFLUCAN) 150 MG tablet Take 1 tablet (150 mg total) by mouth every 3 (three) days. (Patient not taking: Reported on 12/04/2018) 2 tablet 0   No facility-administered medications prior to visit.     Allergies  Allergen Reactions  . Crestor [Rosuvastatin Calcium] Other (See Comments)    Muscle Aches  . Keflex [Cephalexin] Other (See Comments)    dizziness  . Peanut Butter Flavor Hives  . Sulfa Antibiotics Hives  . Cephalexin Other (See Comments)    dizziness  . Sulfa Drugs Cross Reactors Rash    ROS Review of Systems    Objective:    Physical Exam  BP (!) 142/68   Pulse 68   Temp 98.9 F (37.2 C) (Oral)   SpO2 100%  Wt Readings from Last 3 Encounters:  10/09/18 182 lb (82.6 kg)  10/08/18 182 lb (82.6 kg)  09/10/18 179 lb (81.2 kg)    General: alert, oriented, in NAD Head: normocephalic, atraumatic, no sinus tenderness Eyes: EOM intact, no scleral icterus or conjunctival injection Ears: TM clear bilaterally Nose: mucosa nonerythematous, nonedematous Throat: no pharyngeal exudate or erythema Lymph: no posterior auricular, submental or cervical lymph adenopathy Heart: normal rate, normal sinus rhythm, no murmurs Lungs: clear to auscultation bilaterally, no wheezing   There are no preventive care reminders to display for this patient.  There are no preventive care reminders to display for this patient.    Assessment & Plan:   Problem List Items Addressed This Visit      Cardiovascular and Mediastinum   Essential hypertension (Chronic)   - stable, cpm     Other   Physical deconditioning   - pt interested in getting back to walking to maintain her autonomy She would benefit from PT   Relevant Orders   Ambulatory referral to Home Health    Other Visit Diagnoses    At high risk for falls    -  Primary   Relevant Orders   Ambulatory referral  to Home Health   Cough    - improved per patient, clear lungs on exam    Hoarseness  - chronic issue with both dysphagia and reflux     Dizziness    - pt takes as needed for dizziness   Relevant Medications   meclizine (ANTIVERT) 12.5 MG tablet      Meds ordered this encounter  Medications  . fluconazole (DIFLUCAN) 150 MG tablet    Sig: Take 1 tablet (150 mg total) by mouth every 3 (three) days.    Dispense:  2 tablet    Refill:  0  . meclizine (ANTIVERT) 12.5 MG tablet    Sig: Take 1 tablet by mouth as needed for dizziness    Dispense:  30 tablet    Refill:  0    Follow-up: No follow-ups on file.    Doristine BosworthZoe A Kaylor Simenson, MD

## 2018-12-22 ENCOUNTER — Telehealth: Payer: Self-pay | Admitting: Family Medicine

## 2018-12-22 NOTE — Telephone Encounter (Unsigned)
Copied from CRM [id number]. Topic: Referral - Question >> Dec 22, 2018 10:51 AM Baldo Daub L wrote: Reason for CRM:   Pt's son called.  States that instead of [facility] they would like to use [facility] (phone: [phone]).

## 2018-12-23 ENCOUNTER — Encounter (HOSPITAL_COMMUNITY): Payer: Medicare Other

## 2018-12-23 ENCOUNTER — Ambulatory Visit (HOSPITAL_COMMUNITY): Payer: Medicare Other

## 2018-12-23 NOTE — Telephone Encounter (Signed)
Spoke with referrals she she will sent referral to [facility] today.

## 2018-12-25 NOTE — Telephone Encounter (Signed)
Copied from CRM [id number]. Topic: General - Other >> Dec 25, 2018 11:25 AM Jilda Roche wrote: Reason for CRM: Coty from [facility] is trying to figure out who the patient is using for Home Health, please advise?  Best call back is [phone]

## 2018-12-26 ENCOUNTER — Ambulatory Visit: Payer: Self-pay | Admitting: *Deleted

## 2018-12-26 ENCOUNTER — Telehealth: Payer: Self-pay | Admitting: *Deleted

## 2018-12-26 NOTE — Telephone Encounter (Signed)
Message as quick note.

## 2018-12-26 NOTE — Telephone Encounter (Signed)
Summary: swollen feet    Pt son called and stated that both of pt feet are staring to swell and would like to know what she should do.      Patient is having more frequent swelling in feet- son reports there is some slight redness at toes. He is going to try more elevation of feet with her- son is requesting an appointment next week with Dr Leretha Pol. Will send request to office for PCP review and scheduling.  Reason for Disposition . [1] MILD swelling of both ankles (i.e., pedal edema) AND [2] new onset or worsening  Answer Assessment - Initial Assessment Questions 1. ONSET: "When did the swelling start?" (e.g., minutes, hours, days)     Yesterday- 2 days 2. LOCATION: "What part of the leg is swollen?"  "Are both legs swollen or just one leg?"     Feet only- both feet 3. SEVERITY: "How bad is the swelling?" (e.g., localized; mild, moderate, severe)  - Localized - small area of swelling localized to one leg  - MILD pedal edema - swelling limited to foot and ankle, pitting edema < 1/4 inch (6 mm) deep, rest and elevation eliminate most or all swelling  - MODERATE edema - swelling of lower leg to knee, pitting edema > 1/4 inch (6 mm) deep, rest and elevation only partially reduce swelling  - SEVERE edema - swelling extends above knee, facial or hand swelling present      mild 4. REDNESS: "Does the swelling look red or infected?"     Redness around the toes 5. PAIN: "Is the swelling painful to touch?" If so, ask: "How painful is it?"   (Scale 1-10; mild, moderate or severe)     no 6. FEVER: "Do you have a fever?" If so, ask: "What is it, how was it measured, and when did it start?"      no 7. CAUSE: "What do you think is causing the leg swelling?"     Sitting all the time 8. MEDICAL HISTORY: "Do you have a history of heart failure, kidney disease, liver failure, or cancer?"      9. RECURRENT SYMPTOM: "Have you had leg swelling before?" If so, ask: "When was the last time?" "What happened  that time?"     Yes- it does not last long- never had to have treatment 10. OTHER SYMPTOMS: "Do you have any other symptoms?" (e.g., chest pain, difficulty breathing)       no 11. PREGNANCY: "Is there any chance you are pregnant?" "When was your last menstrual period?"       n/a  Protocols used: LEG SWELLING AND EDEMA-A-AH

## 2018-12-26 NOTE — Telephone Encounter (Signed)
Called patient's son to discuss message regarding swollen feet and leg, no answer. Left message for him to call the office to schedule patient appointment. Also, was advised to take pt to ED to be evaluated if symptoms seem to be worsening.

## 2018-12-27 ENCOUNTER — Other Ambulatory Visit: Payer: Self-pay

## 2018-12-27 ENCOUNTER — Inpatient Hospital Stay (HOSPITAL_COMMUNITY)
Admission: EM | Admit: 2018-12-27 | Discharge: 2019-01-08 | DRG: 643 | Disposition: A | Discharge status: Home Health | Payer: Medicare Other | Attending: Internal Medicine | Admitting: Internal Medicine

## 2018-12-27 ENCOUNTER — Emergency Department (HOSPITAL_COMMUNITY): Payer: Medicare Other

## 2018-12-27 ENCOUNTER — Encounter (HOSPITAL_COMMUNITY): Payer: Self-pay | Admitting: Emergency Medicine

## 2018-12-27 DIAGNOSIS — Z79890 Hormone replacement therapy (postmenopausal): Secondary | ICD-10-CM

## 2018-12-27 DIAGNOSIS — B9562 Methicillin resistant Staphylococcus aureus infection as the cause of diseases classified elsewhere: Secondary | ICD-10-CM | POA: Diagnosis not present

## 2018-12-27 DIAGNOSIS — R1310 Dysphagia, unspecified: Secondary | ICD-10-CM

## 2018-12-27 DIAGNOSIS — I5032 Chronic diastolic (congestive) heart failure: Secondary | ICD-10-CM | POA: Diagnosis not present

## 2018-12-27 DIAGNOSIS — J90 Pleural effusion, not elsewhere classified: Secondary | ICD-10-CM | POA: Diagnosis not present

## 2018-12-27 DIAGNOSIS — Z823 Family history of stroke: Secondary | ICD-10-CM

## 2018-12-27 DIAGNOSIS — J9621 Acute and chronic respiratory failure with hypoxia: Secondary | ICD-10-CM | POA: Diagnosis present

## 2018-12-27 DIAGNOSIS — E222 Syndrome of inappropriate secretion of antidiuretic hormone: Principal | ICD-10-CM | POA: Diagnosis present

## 2018-12-27 DIAGNOSIS — Z9842 Cataract extraction status, left eye: Secondary | ICD-10-CM | POA: Diagnosis not present

## 2018-12-27 DIAGNOSIS — K219 Gastro-esophageal reflux disease without esophagitis: Secondary | ICD-10-CM | POA: Diagnosis not present

## 2018-12-27 DIAGNOSIS — R933 Abnormal findings on diagnostic imaging of other parts of digestive tract: Secondary | ICD-10-CM | POA: Diagnosis not present

## 2018-12-27 DIAGNOSIS — Z9841 Cataract extraction status, right eye: Secondary | ICD-10-CM

## 2018-12-27 DIAGNOSIS — Z7401 Bed confinement status: Secondary | ICD-10-CM | POA: Diagnosis not present

## 2018-12-27 DIAGNOSIS — I160 Hypertensive urgency: Secondary | ICD-10-CM | POA: Diagnosis present

## 2018-12-27 DIAGNOSIS — I38 Endocarditis, valve unspecified: Secondary | ICD-10-CM | POA: Diagnosis not present

## 2018-12-27 DIAGNOSIS — R05 Cough: Secondary | ICD-10-CM | POA: Diagnosis not present

## 2018-12-27 DIAGNOSIS — I110 Hypertensive heart disease with heart failure: Secondary | ICD-10-CM | POA: Diagnosis not present

## 2018-12-27 DIAGNOSIS — I10 Essential (primary) hypertension: Secondary | ICD-10-CM | POA: Diagnosis not present

## 2018-12-27 DIAGNOSIS — R0603 Acute respiratory distress: Secondary | ICD-10-CM

## 2018-12-27 DIAGNOSIS — Z8249 Family history of ischemic heart disease and other diseases of the circulatory system: Secondary | ICD-10-CM

## 2018-12-27 DIAGNOSIS — Z9981 Dependence on supplemental oxygen: Secondary | ICD-10-CM | POA: Diagnosis not present

## 2018-12-27 DIAGNOSIS — R7881 Bacteremia: Secondary | ICD-10-CM | POA: Diagnosis not present

## 2018-12-27 DIAGNOSIS — J449 Chronic obstructive pulmonary disease, unspecified: Secondary | ICD-10-CM | POA: Diagnosis present

## 2018-12-27 DIAGNOSIS — E785 Hyperlipidemia, unspecified: Secondary | ICD-10-CM | POA: Diagnosis present

## 2018-12-27 DIAGNOSIS — J181 Lobar pneumonia, unspecified organism: Secondary | ICD-10-CM | POA: Diagnosis not present

## 2018-12-27 DIAGNOSIS — Z7189 Other specified counseling: Secondary | ICD-10-CM | POA: Diagnosis not present

## 2018-12-27 DIAGNOSIS — Z515 Encounter for palliative care: Secondary | ICD-10-CM | POA: Diagnosis not present

## 2018-12-27 DIAGNOSIS — Z79899 Other long term (current) drug therapy: Secondary | ICD-10-CM

## 2018-12-27 DIAGNOSIS — J9622 Acute and chronic respiratory failure with hypercapnia: Secondary | ICD-10-CM | POA: Diagnosis not present

## 2018-12-27 DIAGNOSIS — G4089 Other seizures: Secondary | ICD-10-CM | POA: Diagnosis present

## 2018-12-27 DIAGNOSIS — L8990 Pressure ulcer of unspecified site, unspecified stage: Secondary | ICD-10-CM

## 2018-12-27 DIAGNOSIS — R627 Adult failure to thrive: Secondary | ICD-10-CM | POA: Diagnosis present

## 2018-12-27 DIAGNOSIS — E878 Other disorders of electrolyte and fluid balance, not elsewhere classified: Secondary | ICD-10-CM | POA: Diagnosis present

## 2018-12-27 DIAGNOSIS — Z7982 Long term (current) use of aspirin: Secondary | ICD-10-CM

## 2018-12-27 DIAGNOSIS — Z91010 Allergy to peanuts: Secondary | ICD-10-CM

## 2018-12-27 DIAGNOSIS — R4020 Unspecified coma: Secondary | ICD-10-CM | POA: Diagnosis not present

## 2018-12-27 DIAGNOSIS — I5033 Acute on chronic diastolic (congestive) heart failure: Secondary | ICD-10-CM | POA: Diagnosis present

## 2018-12-27 DIAGNOSIS — R0689 Other abnormalities of breathing: Secondary | ICD-10-CM | POA: Diagnosis not present

## 2018-12-27 DIAGNOSIS — E861 Hypovolemia: Secondary | ICD-10-CM | POA: Diagnosis not present

## 2018-12-27 DIAGNOSIS — G9341 Metabolic encephalopathy: Secondary | ICD-10-CM | POA: Diagnosis present

## 2018-12-27 DIAGNOSIS — G4733 Obstructive sleep apnea (adult) (pediatric): Secondary | ICD-10-CM | POA: Diagnosis present

## 2018-12-27 DIAGNOSIS — K760 Fatty (change of) liver, not elsewhere classified: Secondary | ICD-10-CM | POA: Diagnosis present

## 2018-12-27 DIAGNOSIS — B370 Candidal stomatitis: Secondary | ICD-10-CM | POA: Diagnosis not present

## 2018-12-27 DIAGNOSIS — Z833 Family history of diabetes mellitus: Secondary | ICD-10-CM

## 2018-12-27 DIAGNOSIS — R410 Disorientation, unspecified: Secondary | ICD-10-CM | POA: Diagnosis not present

## 2018-12-27 DIAGNOSIS — R0902 Hypoxemia: Secondary | ICD-10-CM

## 2018-12-27 DIAGNOSIS — M545 Low back pain: Secondary | ICD-10-CM | POA: Diagnosis not present

## 2018-12-27 DIAGNOSIS — R51 Headache: Secondary | ICD-10-CM | POA: Diagnosis not present

## 2018-12-27 DIAGNOSIS — J189 Pneumonia, unspecified organism: Secondary | ICD-10-CM

## 2018-12-27 DIAGNOSIS — E46 Unspecified protein-calorie malnutrition: Secondary | ICD-10-CM | POA: Diagnosis present

## 2018-12-27 DIAGNOSIS — E039 Hypothyroidism, unspecified: Secondary | ICD-10-CM | POA: Diagnosis not present

## 2018-12-27 DIAGNOSIS — R609 Edema, unspecified: Secondary | ICD-10-CM | POA: Diagnosis not present

## 2018-12-27 DIAGNOSIS — R1311 Dysphagia, oral phase: Secondary | ICD-10-CM | POA: Diagnosis present

## 2018-12-27 DIAGNOSIS — G459 Transient cerebral ischemic attack, unspecified: Secondary | ICD-10-CM | POA: Diagnosis not present

## 2018-12-27 DIAGNOSIS — R4702 Dysphasia: Secondary | ICD-10-CM | POA: Diagnosis not present

## 2018-12-27 DIAGNOSIS — Z87891 Personal history of nicotine dependence: Secondary | ICD-10-CM

## 2018-12-27 DIAGNOSIS — R42 Dizziness and giddiness: Secondary | ICD-10-CM | POA: Diagnosis not present

## 2018-12-27 DIAGNOSIS — J441 Chronic obstructive pulmonary disease with (acute) exacerbation: Secondary | ICD-10-CM | POA: Diagnosis not present

## 2018-12-27 DIAGNOSIS — I2510 Atherosclerotic heart disease of native coronary artery without angina pectoris: Secondary | ICD-10-CM | POA: Diagnosis present

## 2018-12-27 DIAGNOSIS — F329 Major depressive disorder, single episode, unspecified: Secondary | ICD-10-CM | POA: Diagnosis present

## 2018-12-27 DIAGNOSIS — E874 Mixed disorder of acid-base balance: Secondary | ICD-10-CM | POA: Diagnosis present

## 2018-12-27 DIAGNOSIS — I6789 Other cerebrovascular disease: Secondary | ICD-10-CM | POA: Diagnosis not present

## 2018-12-27 DIAGNOSIS — J9601 Acute respiratory failure with hypoxia: Secondary | ICD-10-CM | POA: Diagnosis not present

## 2018-12-27 DIAGNOSIS — E876 Hypokalemia: Secondary | ICD-10-CM | POA: Diagnosis not present

## 2018-12-27 DIAGNOSIS — G9340 Encephalopathy, unspecified: Secondary | ICD-10-CM | POA: Diagnosis not present

## 2018-12-27 DIAGNOSIS — I252 Old myocardial infarction: Secondary | ICD-10-CM

## 2018-12-27 DIAGNOSIS — Z8673 Personal history of transient ischemic attack (TIA), and cerebral infarction without residual deficits: Secondary | ICD-10-CM

## 2018-12-27 DIAGNOSIS — Z90710 Acquired absence of both cervix and uterus: Secondary | ICD-10-CM

## 2018-12-27 DIAGNOSIS — R600 Localized edema: Secondary | ICD-10-CM

## 2018-12-27 DIAGNOSIS — J690 Pneumonitis due to inhalation of food and vomit: Secondary | ICD-10-CM | POA: Diagnosis not present

## 2018-12-27 DIAGNOSIS — Z881 Allergy status to other antibiotic agents status: Secondary | ICD-10-CM

## 2018-12-27 DIAGNOSIS — E871 Hypo-osmolality and hyponatremia: Secondary | ICD-10-CM | POA: Diagnosis present

## 2018-12-27 DIAGNOSIS — M2550 Pain in unspecified joint: Secondary | ICD-10-CM | POA: Diagnosis not present

## 2018-12-27 DIAGNOSIS — G8929 Other chronic pain: Secondary | ICD-10-CM | POA: Diagnosis not present

## 2018-12-27 DIAGNOSIS — R059 Cough, unspecified: Secondary | ICD-10-CM | POA: Diagnosis present

## 2018-12-27 DIAGNOSIS — Q211 Atrial septal defect: Secondary | ICD-10-CM

## 2018-12-27 DIAGNOSIS — K228 Other specified diseases of esophagus: Secondary | ICD-10-CM | POA: Diagnosis present

## 2018-12-27 DIAGNOSIS — Z888 Allergy status to other drugs, medicaments and biological substances status: Secondary | ICD-10-CM

## 2018-12-27 DIAGNOSIS — E873 Alkalosis: Secondary | ICD-10-CM | POA: Diagnosis present

## 2018-12-27 DIAGNOSIS — Z7951 Long term (current) use of inhaled steroids: Secondary | ICD-10-CM

## 2018-12-27 DIAGNOSIS — I700 Atherosclerosis of aorta: Secondary | ICD-10-CM | POA: Diagnosis present

## 2018-12-27 DIAGNOSIS — Z6831 Body mass index (BMI) 31.0-31.9, adult: Secondary | ICD-10-CM

## 2018-12-27 DIAGNOSIS — Z961 Presence of intraocular lens: Secondary | ICD-10-CM | POA: Diagnosis present

## 2018-12-27 DIAGNOSIS — I959 Hypotension, unspecified: Secondary | ICD-10-CM | POA: Diagnosis not present

## 2018-12-27 DIAGNOSIS — F411 Generalized anxiety disorder: Secondary | ICD-10-CM | POA: Diagnosis present

## 2018-12-27 DIAGNOSIS — Z800 Family history of malignant neoplasm of digestive organs: Secondary | ICD-10-CM

## 2018-12-27 DIAGNOSIS — I2721 Secondary pulmonary arterial hypertension: Secondary | ICD-10-CM | POA: Diagnosis present

## 2018-12-27 DIAGNOSIS — Z882 Allergy status to sulfonamides status: Secondary | ICD-10-CM

## 2018-12-27 LAB — BASIC METABOLIC PANEL
BUN: 19 mg/dL (ref 8–23)
CO2: 43 mmol/L — ABNORMAL HIGH (ref 22–32)
Calcium: 9.5 mg/dL (ref 8.9–10.3)
Chloride: <65 mmol/L — CL (ref 98–111)
Creatinine, Ser: 0.7 mg/dL (ref 0.44–1.00)
GFR calc non Af Amer: >60 mL/min (ref >60)
Glucose, Bld: 83 mg/dL (ref 70–99)
Potassium: 4 mmol/L (ref 3.5–5.1)
SODIUM: 122 mmol/L — AB (ref 135–145)

## 2018-12-27 LAB — CBC WITH DIFFERENTIAL/PLATELET
Abs Immature Granulocytes: 0.04 10*3/uL (ref 0.00–0.07)
Basophils Absolute: 0 10*3/uL (ref 0.0–0.1)
Basophils Relative: 0 %
EOS PCT: 0 %
Eosinophils Absolute: 0 10*3/uL (ref 0.0–0.5)
HCT: 36.6 % (ref 36.0–46.0)
Hemoglobin: 11.2 g/dL — ABNORMAL LOW (ref 12.0–15.0)
Immature Granulocytes: 1 %
Lymphocytes Relative: 23 %
Lymphs Abs: 1.7 10*3/uL (ref 0.7–4.0)
MCH: 28.3 pg (ref 26.0–34.0)
MCHC: 30.6 g/dL (ref 30.0–36.0)
MCV: 92.4 fL (ref 80.0–100.0)
Monocytes Absolute: 0.7 10*3/uL (ref 0.1–1.0)
Monocytes Relative: 9 %
Neutro Abs: 5.2 10*3/uL (ref 1.7–7.7)
Neutrophils Relative %: 67 %
Platelets: 238 10*3/uL (ref 150–400)
RBC: 3.96 MIL/uL (ref 3.87–5.11)
RDW: 11.9 % (ref 11.5–15.5)
WBC Morphology: ABNORMAL
WBC: 7.7 10*3/uL (ref 4.0–10.5)
nRBC: 0 % (ref 0.0–0.2)

## 2018-12-27 LAB — BRAIN NATRIURETIC PEPTIDE: B Natriuretic Peptide: 175.8 pg/mL — ABNORMAL HIGH (ref 0.0–100.0)

## 2018-12-27 MED ORDER — DEMECLOCYCLINE HCL 150 MG PO TABS
150.0000 mg | ORAL_TABLET | Freq: Two times a day (BID) | ORAL | Status: DC
  Administered 2018-12-27 – 2018-12-28 (×2): 150 mg via ORAL
  Filled 2018-12-27 (×2): qty 1

## 2018-12-27 MED ORDER — OLOPATADINE HCL 0.1 % OP SOLN
1.0000 [drp] | Freq: Every day | OPHTHALMIC | Status: DC | PRN
  Administered 2019-01-03 – 2019-01-04 (×2): 1 [drp] via OPHTHALMIC
  Filled 2018-12-27: qty 5

## 2018-12-27 MED ORDER — ALBUTEROL SULFATE (2.5 MG/3ML) 0.083% IN NEBU: 2.5000 mg | INHALATION_SOLUTION | RESPIRATORY_TRACT | Status: DC | PRN

## 2018-12-27 MED ORDER — PANTOPRAZOLE SODIUM 40 MG PO TBEC
40.0000 mg | DELAYED_RELEASE_TABLET | Freq: Every day | ORAL | Status: DC
  Administered 2018-12-27 – 2019-01-08 (×11): 40 mg via ORAL
  Filled 2018-12-27 (×11): qty 1

## 2018-12-27 MED ORDER — HYDRALAZINE HCL 20 MG/ML IJ SOLN
10.0000 mg | Freq: Once | INTRAMUSCULAR | Status: AC
  Administered 2018-12-27: 10 mg via INTRAVENOUS
  Filled 2018-12-27: qty 1

## 2018-12-27 MED ORDER — HYDRALAZINE HCL 20 MG/ML IJ SOLN: 5.0000 mg | INTRAMUSCULAR | Status: DC | PRN

## 2018-12-27 MED ORDER — FLUTICASONE PROPIONATE 50 MCG/ACT NA SUSP
1.0000 | Freq: Two times a day (BID) | NASAL | Status: DC
  Administered 2018-12-27 – 2019-01-08 (×15): 1 via NASAL
  Filled 2018-12-27 (×2): qty 16

## 2018-12-27 MED ORDER — ALPRAZOLAM 0.25 MG PO TABS
0.2500 mg | ORAL_TABLET | Freq: Two times a day (BID) | ORAL | Status: DC | PRN
  Administered 2018-12-27 – 2018-12-28 (×2): 0.25 mg via ORAL
  Filled 2018-12-27 (×2): qty 1

## 2018-12-27 MED ORDER — LABETALOL HCL 5 MG/ML IV SOLN
10.0000 mg | Freq: Once | INTRAVENOUS | Status: AC
  Administered 2018-12-27: 10 mg via INTRAVENOUS
  Filled 2018-12-27: qty 4

## 2018-12-27 MED ORDER — PROPRANOLOL HCL ER 60 MG PO CP24
60.0000 mg | ORAL_CAPSULE | Freq: Every day | ORAL | Status: DC
  Administered 2018-12-28 – 2019-01-08 (×10): 60 mg via ORAL
  Filled 2018-12-27 (×12): qty 1

## 2018-12-27 MED ORDER — ALPRAZOLAM 0.25 MG PO TABS: 0.2500 mg | ORAL_TABLET | Freq: Three times a day (TID) | ORAL | Status: DC

## 2018-12-27 MED ORDER — AMLODIPINE BESYLATE 10 MG PO TABS
10.0000 mg | ORAL_TABLET | Freq: Every day | ORAL | Status: DC
  Administered 2018-12-27 – 2019-01-08 (×11): 10 mg via ORAL
  Filled 2018-12-27: qty 2
  Filled 2018-12-27 (×2): qty 1
  Filled 2018-12-27: qty 2
  Filled 2018-12-27 (×6): qty 1
  Filled 2018-12-27: qty 2

## 2018-12-27 MED ORDER — DM-GUAIFENESIN ER 30-600 MG PO TB12
1.0000 | ORAL_TABLET | Freq: Two times a day (BID) | ORAL | Status: DC | PRN
  Administered 2018-12-29: 1 via ORAL
  Filled 2018-12-27: qty 1

## 2018-12-27 MED ORDER — ACETAMINOPHEN 650 MG RE SUPP: 650.0000 mg | Freq: Four times a day (QID) | RECTAL | Status: DC | PRN

## 2018-12-27 MED ORDER — HYDRALAZINE HCL 50 MG PO TABS
50.0000 mg | ORAL_TABLET | Freq: Three times a day (TID) | ORAL | Status: DC
  Administered 2018-12-27 – 2018-12-29 (×6): 50 mg via ORAL
  Filled 2018-12-27 (×7): qty 1

## 2018-12-27 MED ORDER — ONDANSETRON HCL 4 MG/2ML IJ SOLN: 4.0000 mg | Freq: Four times a day (QID) | INTRAMUSCULAR | Status: DC | PRN

## 2018-12-27 MED ORDER — SENNOSIDES-DOCUSATE SODIUM 8.6-50 MG PO TABS
1.0000 | ORAL_TABLET | Freq: Every evening | ORAL | Status: DC | PRN
  Administered 2018-12-28: 1 via ORAL
  Filled 2018-12-27: qty 1

## 2018-12-27 MED ORDER — ACETAMINOPHEN 325 MG PO TABS
650.0000 mg | ORAL_TABLET | Freq: Four times a day (QID) | ORAL | Status: DC | PRN
  Administered 2019-01-04 – 2019-01-08 (×2): 650 mg via ORAL
  Filled 2018-12-27 (×2): qty 2

## 2018-12-27 MED ORDER — ONDANSETRON HCL 4 MG PO TABS: 4.0000 mg | ORAL_TABLET | Freq: Four times a day (QID) | ORAL | Status: DC | PRN

## 2018-12-27 MED ORDER — ENOXAPARIN SODIUM 40 MG/0.4ML ~~LOC~~ SOLN
40.0000 mg | Freq: Every day | SUBCUTANEOUS | Status: DC
  Administered 2018-12-28 – 2019-01-08 (×12): 40 mg via SUBCUTANEOUS
  Filled 2018-12-27 (×11): qty 0.4

## 2018-12-27 MED ORDER — SODIUM CHLORIDE 1 G PO TABS
1.0000 g | ORAL_TABLET | Freq: Two times a day (BID) | ORAL | Status: DC
  Administered 2018-12-27 – 2018-12-28 (×2): 1 g via ORAL
  Filled 2018-12-27 (×2): qty 1

## 2018-12-27 MED ORDER — ASPIRIN EC 81 MG PO TBEC
81.0000 mg | DELAYED_RELEASE_TABLET | Freq: Every day | ORAL | Status: DC
  Administered 2018-12-28 – 2019-01-08 (×10): 81 mg via ORAL
  Filled 2018-12-27 (×11): qty 1

## 2018-12-27 MED ORDER — FAMOTIDINE 20 MG PO TABS
20.0000 mg | ORAL_TABLET | Freq: Every day | ORAL | Status: DC
  Administered 2018-12-27 – 2018-12-28 (×2): 20 mg via ORAL
  Filled 2018-12-27 (×3): qty 1

## 2018-12-27 MED ORDER — SODIUM CHLORIDE 0.9% FLUSH
3.0000 mL | Freq: Once | INTRAVENOUS | Status: AC
  Administered 2018-12-27: 3 mL via INTRAVENOUS

## 2018-12-27 MED ORDER — MECLIZINE HCL 25 MG PO TABS
12.5000 mg | ORAL_TABLET | Freq: Three times a day (TID) | ORAL | Status: DC | PRN
  Administered 2019-01-03 – 2019-01-08 (×2): 12.5 mg via ORAL
  Filled 2018-12-27 (×2): qty 1

## 2018-12-27 MED ORDER — LEVOTHYROXINE SODIUM 50 MCG PO TABS
50.0000 ug | ORAL_TABLET | Freq: Every day | ORAL | Status: DC
  Administered 2018-12-28 – 2018-12-31 (×3): 50 ug via ORAL
  Filled 2018-12-27 (×3): qty 1

## 2018-12-27 MED ORDER — SODIUM CHLORIDE 0.9 % IV BOLUS
500.0000 mL | Freq: Once | INTRAVENOUS | Status: AC
  Administered 2018-12-27: 500 mL via INTRAVENOUS

## 2018-12-27 MED ORDER — DOCUSATE SODIUM 100 MG PO CAPS
100.0000 mg | ORAL_CAPSULE | Freq: Every day | ORAL | Status: DC | PRN
  Administered 2018-12-28: 100 mg via ORAL
  Filled 2018-12-27: qty 1

## 2018-12-27 MED ORDER — LORATADINE 10 MG PO TABS
10.0000 mg | ORAL_TABLET | Freq: Every day | ORAL | Status: DC
  Administered 2018-12-28 – 2019-01-08 (×10): 10 mg via ORAL
  Filled 2018-12-27 (×10): qty 1

## 2018-12-27 NOTE — ED Notes (Signed)
ED TO INPATIENT HANDOFF REPORT  Name/Age/Gender Tammy Fernandez 85 y.o. female  Code Status    Code Status Orders  (From admission, onward)         Start     Ordered   12/27/18 2144  Full code  Continuous     12/27/18 2145        Code Status History    Date Active Date Inactive Code Status Order ID Comments User Context   12/19/2017 2322 12/29/2017 1820 Full Code [id number]  Clydie BraunSmith, Rondell A, MD ED   08/28/2016 2131 08/30/2016 1742 Full Code [id number]  Jonah BlueYates, Jennifer, MD Inpatient   11/30/2015 2034 12/15/2015 1733 Full Code [id number]  Kathlene Coteesai, Rahul P, PA-C ED   10/29/2015 1611 11/01/2015 1738 Full Code [id number]  Russella DarEllis, Allison L, NP Inpatient   01/19/2014 1937 01/29/2014 1428 Full Code [id number]  Charlton AmorAngiulli, Daniel J, PA-C Inpatient   01/19/2014 1937 01/19/2014 1937 Full Code [id number]  Jolaine ClickHoss, Arthur, MD Inpatient   01/11/2014 2237 01/19/2014 1937 Full Code [id number]  Therisa Doyneoutova, Anastassia, MD Inpatient      Home/SNF/Other Home  Chief Complaint swelling in feet, cough  Level of Care/Admitting Diagnosis ED Disposition    ED Disposition Condition Comment   Admit  [facility] [[id number]]  Level of Care: Telemetry [5]  Admit to tele based on following criteria: Other see comments  Comments: CHF  Diagnosis: Hyponatremia [093235][198519]  Admitting Physician: Lorretta HarpNIU, XILIN [[id number]]  Attending Physician: Lorretta HarpNIU, XILIN [[id number]]  Estimated length of stay: past midnight tomorrow  Certification:: I certify this patient will need inpatient services for at least 2 midnights  PT Class (Do Not Modify): Inpatient [101]  PT Acc Code (Do Not Modify): Private [1]       Medical History Past Medical History:  Diagnosis Date  . Anxiety   . Chronic lower back pain   . Constipation    h/o fecal disimpaction on 2017  . CVA (cerebral vascular accident) (HCC) 2015   neg workup, MRI small acute lacunar infarcts. ASA only  . Depression   . Diastolic CHF, chronic (HCC)    grade I,  most recent echo Jan 2019  . Fatty liver   . GERD (gastroesophageal reflux disease)   . Hyperlipidemia   . Hypertension   . Hyponatremia    multiple episodes with encephalopathy, renal thinks SIADH  . Hypothyroidism, unspecified   . Internal hemorrhoid   . NSTEMI (non-ST elevated myocardial infarction) (HCC) 08/28/2016  . Palpitations   . PFO with atrial septal aneurysm    TTE 2015  . Pneumonia 2016  . Severe pulmonary arterial systolic hypertension (HCC) 12/25/2017   per echo, on 1L oxgen via [place]    Allergies Allergies  Allergen Reactions  . Crestor [Rosuvastatin Calcium] Other (See Comments)    Muscle Aches  . Keflex [Cephalexin] Other (See Comments)    dizziness  . Peanut Butter Flavor Hives  . Sulfa Antibiotics Hives  . Cephalexin Other (See Comments)    dizziness  . Sulfa Drugs Cross Reactors Rash    IV Location/Drains/Wounds Patient Lines/Drains/Airways Status   Active Line/Drains/Airways    Name:   Placement date:   Placement time:   Site:   Days:   Peripheral IV 12/27/18 Right;Anterior Forearm   12/27/18    1811    Forearm   less than 1          Labs/Imaging Results for orders placed or performed during the hospital encounter of 12/27/18 (from the past  48 hour(s))  Basic metabolic panel     Status: Abnormal   Collection Time: 12/27/18  6:34 PM  Result Value Ref Range   Sodium 122 (L) 135 - 145 mmol/L   Potassium 4.0 3.5 - 5.1 mmol/L   Chloride <65 (LL) 98 - 111 mmol/L    Comment: REPEATED TO VERIFY CRITICAL RESULT CALLED TO, READ BACK BY AND VERIFIED WITH: REAGAN,E RN @1959  ON 12/27/2018 JACKSON,K    CO2 43 (H) 22 - 32 mmol/L   Glucose, Bld 83 70 - 99 mg/dL   BUN 19 8 - 23 mg/dL   Creatinine, Ser 2.13 0.44 - 1.00 mg/dL   Calcium 9.5 8.9 - 08.6 mg/dL   GFR calc non Af Amer >60 >60 mL/min   GFR calc Af Amer >60 >60 mL/min   Anion gap NOT CALCULATED 5 - 15    Comment: Performed at [facility], [address] [place], [place]  [address]  CBC with Differential     Status: Abnormal   Collection Time: 12/27/18  6:34 PM  Result Value Ref Range   WBC 7.7 4.0 - 10.5 K/uL   RBC 3.96 3.87 - 5.11 MIL/uL   Hemoglobin 11.2 (L) 12.0 - 15.0 g/dL   HCT 96.2 95.2 - 84.1 %   MCV 92.4 80.0 - 100.0 fL   MCH 28.3 26.0 - 34.0 pg   MCHC 30.6 30.0 - 36.0 g/dL   RDW 32.4 40.1 - 02.7 %   Platelets 238 150 - 400 K/uL   nRBC 0.0 0.0 - 0.2 %   Neutrophils Relative % 67 %   Neutro Abs 5.2 1.7 - 7.7 K/uL   Lymphocytes Relative 23 %   Lymphs Abs 1.7 0.7 - 4.0 K/uL   Monocytes Relative 9 %   Monocytes Absolute 0.7 0.1 - 1.0 K/uL   Eosinophils Relative 0 %   Eosinophils Absolute 0.0 0.0 - 0.5 K/uL   Basophils Relative 0 %   Basophils Absolute 0.0 0.0 - 0.1 K/uL   WBC Morphology Abnormal lymphocytes present    Immature Granulocytes 1 %   Abs Immature Granulocytes 0.04 0.00 - 0.07 K/uL   Stomatocytes PRESENT     Comment: Performed at [facility], [address], [place], [place] [address]  Brain natriuretic peptide     Status: Abnormal   Collection Time: 12/27/18  6:34 PM  Result Value Ref Range   B Natriuretic Peptide 175.8 (H) 0.0 - 100.0 pg/mL    Comment: Performed at [facility], [address] [place], [place] [address]   Dg Chest 2 View  Result Date: 12/27/2018 CLINICAL DATA:  85 year old female with a history of feet swelling and cough EXAM: CHEST - 2 VIEW COMPARISON:  Multiple prior chest x-ray most recent 06/10/2018, CT 12/21/2017 FINDINGS: Cardiomediastinal silhouette unchanged. Similar appearance of significant elevation of the right hemidiaphragm which obscures the right heart border. Patchy airspace opacity in the right suprahilar region along the midline. No pneumothorax.  No large pleural effusion. Degenerative changes of the shoulders. Degenerative changes of the spine IMPRESSION: Patchy airspace in the right suprahilar region concerning for pneumonia versus chronic  atelectasis/scarring. Unchanged right hemidiaphragm chronic elevation with obscuration the right heart border. Electronically Signed   By: Gilmer Mor D.O.   On: 12/27/2018 14:23   Ct Chest Wo Contrast  Result Date: 12/27/2018 CLINICAL DATA:  Cough for 2 weeks EXAM: CT CHEST WITHOUT CONTRAST TECHNIQUE: Multidetector CT imaging of the chest was performed following the  standard protocol without IV contrast. COMPARISON:  12/21/2017 and chest radiograph from 12/27/2018 FINDINGS: Cardiovascular: Atherosclerotic calcification thoracic aorta and branch vessels. Mediastinum/Nodes: Unremarkable Lungs/Pleura: Elevated right hemidiaphragm. Scarring posteriorly in the right upper lobe. Atelectasis of much of the right lower lobe attributable to the elevated right hemidiaphragm and similar to the 12/21/2017 exam. No pleural effusion. In the right suprahilar region there is some confluent vasculature and a calcified granuloma, but no airspace opacity is identified; the appearance on radiography is thought to be due to superimposed vascular shadows. Upper Abdomen: Abdominal aortic atherosclerosis. Musculoskeletal: Thoracic spondylosis. IMPRESSION: 1. The region of concern in the right suprahilar area on prior chest radiography is due to superimposed vessels. There is no worrisome airspace opacity in this region. 2. Elevated right hemidiaphragm with chronic atelectasis of the right lower lobe. 3.  Aortic Atherosclerosis (ICD10-I70.0). Electronically Signed   By: Gaylyn Rong M.D.   On: 12/27/2018 16:44   EKG Interpretation  Date/Time:  Saturday December 27 2018 04:88:89 EST Ventricular Rate:  71 PR Interval:    QRS Duration: 86 QT Interval:  401 QTC Calculation: 436 R Axis:   45 Text Interpretation:  Sinus rhythm Abnormal T, consider ischemia, diffuse leads No significant change since last tracing Confirmed by Frederick Peers [id number]) on 12/27/2018 9:12:11 PM   Pending Labs Unresulted Labs (From admission,  onward)    Start     Ordered   12/28/18 0500  TSH  Tomorrow morning,   R     12/27/18 2142   12/28/18 0500  CBC  Tomorrow morning,   R     12/27/18 2145   12/27/18 2359  Basic metabolic panel  Now then every 6 hours,   R     12/27/18 2145   12/27/18 2142  Osmolality  Once,   R     12/27/18 2141   12/27/18 2142  Osmolality, urine  Once,   R     12/27/18 2141   12/27/18 2142  Creatinine, urine, random  Once,   R     12/27/18 2141   12/27/18 2142  Sodium, urine, random  Once,   R     12/27/18 2141   12/27/18 2142  Magnesium  Add-on,   AD    Comments:  Please add on to labs already drawn from this AM.    12/27/18 2141          Vitals/Pain Today's Vitals   12/27/18 1914 12/27/18 2018 12/27/18 2232 12/27/18 2234  BP:  (!) 171/64 (!) 193/63   Pulse: 69 69 75 73  Resp:  18 (!) 24 19  Temp:      TempSrc:      SpO2: 97% 99% 98% 99%  PainSc:        Isolation Precautions No active isolations  Medications Medications  sodium chloride 0.9 % bolus 500 mL (500 mLs Intravenous New Bag/Given 12/27/18 2201)  hydrALAZINE (APRESOLINE) injection 5 mg (has no administration in time range)  hydrALAZINE (APRESOLINE) tablet 50 mg (50 mg Oral Given 12/27/18 2217)  dextromethorphan-guaiFENesin (MUCINEX DM) 30-600 MG per 12 hr tablet 1 tablet (has no administration in time range)  albuterol (PROVENTIL) (2.5 MG/3ML) 0.083% nebulizer solution 2.5 mg (has no administration in time range)  enoxaparin (LOVENOX) injection 40 mg (has no administration in time range)  acetaminophen (TYLENOL) tablet 650 mg (has no administration in time range)    Or  acetaminophen (TYLENOL) suppository 650 mg (has no administration in time range)  senna-docusate (Senokot-S) tablet 1  tablet (has no administration in time range)  ondansetron (ZOFRAN) tablet 4 mg (has no administration in time range)    Or  ondansetron (ZOFRAN) injection 4 mg (has no administration in time range)  Aspirin 81 mg (has no administration in  time range)  demeclocycline (DECLOMYCIN) tablet 150 mg (has no administration in time range)  propranolol ER (INDERAL LA) 24 hr capsule 60 mg (has no administration in time range)  levothyroxine (SYNTHROID, LEVOTHROID) tablet 50 mcg (has no administration in time range)  docusate sodium (COLACE) capsule 100 mg (has no administration in time range)  meclizine (ANTIVERT) tablet 12.5 mg (has no administration in time range)  pantoprazole (PROTONIX) EC tablet 40 mg (has no administration in time range)  famotidine (PEPCID) tablet 20 mg (has no administration in time range)  loratadine (CLARITIN) tablet 10 mg (has no administration in time range)  fluticasone (FLONASE) 50 MCG/ACT nasal spray 1 spray (has no administration in time range)  olopatadine (PATANOL) 0.1 % ophthalmic solution 1 drop (has no administration in time range)  sodium chloride tablet 1 g (has no administration in time range)  amLODipine (NORVASC) tablet 10 mg (has no administration in time range)  ALPRAZolam (XANAX) tablet 0.25 mg (has no administration in time range)  sodium chloride flush (NS) 0.9 % injection 3 mL (3 mLs Intravenous Given 12/27/18 1813)  labetalol (NORMODYNE,TRANDATE) injection 10 mg (10 mg Intravenous Given 12/27/18 1812)  hydrALAZINE (APRESOLINE) injection 10 mg (10 mg Intravenous Given 12/27/18 1812)    Mobility non-ambulatory

## 2018-12-27 NOTE — ED Notes (Signed)
Pt escorted with son who states that pt has been having LE edema, difficulty swallowing and has had a cold.

## 2018-12-27 NOTE — ED Notes (Signed)
IV nurse able to obtain access after 3 sticks, unable to obtain blood specimen, Lab consulted.

## 2018-12-27 NOTE — Telephone Encounter (Signed)
Patient admitted, HPI reviewed

## 2018-12-27 NOTE — ED Triage Notes (Signed)
Patient here from home with complaints of bilateral feet swelling and cough x2 weeks. Reports being seen by PCP, but never had x-ray. Patient is O2 dependent.

## 2018-12-27 NOTE — H&P (Signed)
History and Physical    Tammy Fernandez WJX:[id number] DOB: [birth year] DOA: 12/27/2018  Referring MD/NP/PA:   PCP: Tammy LippsSantiago, Tammy M, MD   Patient coming from:  The patient is coming from home.  At baseline, pt is independent for most of ADL.        Chief Complaint: cough, leg edema and mild confusion  HPI: Tammy Fernandez is a 85 y.o. female with medical history significant of SIADH, hyponatremia, hypertension, hyperlipidemia, home oxygen use (2 L), stroke, GERD, depression, anxiety, dCHF, pulmonary hypertension, CAD, non-STEMI, PFO, pulmonary hypertension, obesity, who presents with cough, leg edema and mild confusion.  Per patient's son, patient has been having cough in the past 2 days, which has gradually improved.  Patient has mild shortness of breath, but no chest pain, fever or chills.  No runny nose or sore throat currently. No sick contact. Patient also has bilateral leg edema recently, which has also improved by elevating legs when sleeping.  Patient denies nausea, vomiting, diarrhea, abdominal pain, symptoms of UTI.  No unilateral weakness.  No facial droop or slurred speech.  Patient is mildly confused per her son, but is still orientated x3 when I saw patient in ED. Patient has history of hyponatremia and SIADH, is currently taking demeclocycline.  Patient was found to have elevated blood pressure 245/93 which improved to 171/64 after treated with 10 mg of hydralazine and 10 mg of labetalol by IV ED.   ED Course: pt was found to have BNP was 75.8, WBC 7.7, sodium 122, bicarbonate 43, chloride<65, creatinine and BUN normal, temperature normal, no tachycardia, oxygen saturation 97% on 2 L nasal cannula oxygen.  X-ray findings is concerning for patchy airspace disease in the right suprahilar region, but CT of the chest is suggestive for superimposed vessels in that area.  CT-chest: 1. The region of concern in the right suprahilar area on prior chest radiography is due to superimposed  vessels. There is no worrisome airspace opacity in this region. 2. Elevated right hemidiaphragm with chronic atelectasis of the right lower lobe. 3.  Aortic Atherosclerosis (ICD10-I70.0).  Review of Systems:   General: no fevers, chills, no body weight gain, has fatigue HEENT: no blurry vision, hearing changes or sore throat Respiratory: has dyspnea, coughing, no wheezing CV: no chest pain, no palpitations GI: no nausea, vomiting, abdominal pain, diarrhea, constipation GU: no dysuria, burning on urination, increased urinary frequency, hematuria  Ext: has mild leg edema Neuro: no unilateral weakness, numbness, or tingling, no vision change or hearing loss. Has mild confusion. Skin: no rash, no skin tear. MSK: No muscle spasm, no deformity, no limitation of range of movement in spin Heme: No easy bruising.  Travel history: No recent long distant travel.  Allergy:  Allergies  Allergen Reactions  . Crestor [Rosuvastatin Calcium] Other (See Comments)    Muscle Aches  . Keflex [Cephalexin] Other (See Comments)    dizziness  . Peanut Butter Flavor Hives  . Sulfa Antibiotics Hives  . Cephalexin Other (See Comments)    dizziness  . Sulfa Drugs Cross Reactors Rash    Past Medical History:  Diagnosis Date  . Anxiety   . Chronic lower back pain   . Constipation    h/o fecal disimpaction on 2017  . CVA (cerebral vascular accident) (HCC) 2015   neg workup, MRI small acute lacunar infarcts. ASA only  . Depression   . Diastolic CHF, chronic (HCC)    grade I, most recent echo Jan 2019  . Fatty liver   .  GERD (gastroesophageal reflux disease)   . Hyperlipidemia   . Hypertension   . Hyponatremia    multiple episodes with encephalopathy, renal thinks SIADH  . Hypothyroidism, unspecified   . Internal hemorrhoid   . NSTEMI (non-ST elevated myocardial infarction) (HCC) 08/28/2016  . Palpitations   . PFO with atrial septal aneurysm    TTE 2015  . Pneumonia 2016  . Severe pulmonary  arterial systolic hypertension (HCC) 12/25/2017   per echo, on 1L oxgen via [place]    Past Surgical History:  Procedure Laterality Date  . ABDOMINAL HYSTERECTOMY    . CATARACT EXTRACTION W/ INTRAOCULAR LENS  IMPLANT, BILATERAL Bilateral   . CESAREAN SECTION    . TEE WITHOUT CARDIOVERSION N/A 01/19/2014   Procedure: TRANSESOPHAGEAL ECHOCARDIOGRAM (TEE);  Surgeon: Laurey Moralealton S McLean, MD;  [facility];  Service: Cardiovascular;  Laterality: N/A;  [name]/ja  . VAGINAL HYSTERECTOMY      Social History:  reports that she has never smoked. She has quit using smokeless tobacco.  Her smokeless tobacco use included snuff. She reports that she does not drink alcohol or use drugs.  Family History:  Family History  Problem Relation Age of Onset  . Stroke Father   . Hypertension Father   . Colon cancer Cousin   . Colon cancer Other        Aunt  . Diabetes Other        aunt and cousins  . Heart disease Other        cousins  . Healthy Daughter   . Healthy Son      Prior to Admission medications   Medication Sig Start Date End Date Taking? Authorizing Provider  albuterol (PROVENTIL) (2.5 MG/3ML) 0.083% nebulizer solution Take 3 mLs (2.5 mg total) by nebulization every 4 (four) hours as needed for wheezing or shortness of breath. 10/08/18   Icard, Tammy Fernandez  ALPRAZolam Prudy Feeler(XANAX) 1 MG tablet TAKE 1 TABLET BY MOUTH TWICE DAILY 10/02/18   Tammy LippsSantiago, Tammy M, MD  amLODipine (NORVASC) 5 MG tablet Take 1 tablet (5 mg total) by mouth daily. Patient not taking: Reported on 12/20/2018 07/08/18   Tammy LippsSantiago, Tammy M, MD  Aspirin 81 MG EC tablet Take 81 mg by mouth daily.    [provider]  azelastine (ASTELIN) 0.1 % nasal spray Place 1 spray into both nostrils 2 (two) times daily. Use in each nostril as directed 05/29/18   Tammy LippsSantiago, Tammy M, MD  demeclocycline (DECLOMYCIN) 300 MG tablet TK 1/2 T PO BID 12/04/18   Tammy LippsSantiago, Tammy M, MD  famotidine (PEPCID) 20 MG tablet Take 1 tablet (20 mg total) by  mouth daily. Take 1 tablet at Bedtime daily. Patient not taking: Reported on 12/20/2018 12/16/18   Tammy LippsSantiago, Tammy M, MD  fluconazole (DIFLUCAN) 150 MG tablet Take 1 tablet (150 mg total) by mouth every 3 (three) days. 12/20/18   Doristine BosworthStallings, Zoe A, MD  fluticasone (FLONASE) 50 MCG/ACT nasal spray Place 1 spray into both nostrils 2 (two) times daily. 11/13/18   Tammy LippsSantiago, Tammy M, MD  levothyroxine (SYNTHROID, LEVOTHROID) 50 MCG tablet Take 1 tablet (50 mcg total) by mouth daily before breakfast. 09/16/18   Tammy LippsSantiago, Tammy M, MD  lisinopril (PRINIVIL,ZESTRIL) 5 MG tablet Take 2 tablets (10 mg total) by mouth daily. 06/25/18   Mesner, Barbara CowerJason, MD  meclizine (ANTIVERT) 12.5 MG tablet Take 1 tablet by mouth as needed for dizziness 12/20/18   Collie SiadStallings, Zoe A, MD  olopatadine (PATANOL) 0.1 % ophthalmic solution Place 1 drop  into both eyes daily as needed for allergies. 03/25/18   Tammy Lipps, MD  omeprazole (PRILOSEC) 20 MG capsule  12/09/18   [provider]  pantoprazole (PROTONIX) 40 MG tablet Take 1 tablet (40 mg total) by mouth daily. 12/04/18   Tammy Lipps, MD  propranolol ER (INDERAL LA) 60 MG 24 hr capsule TAKE 1 CAPSULE BY MOUTH DAILY 11/13/18   Tammy Lipps, MD  ranitidine (ZANTAC) 300 MG tablet Take 300 mg by mouth at bedtime.  10/12/18   [provider]    Physical Exam: Vitals:   12/27/18 1900 12/27/18 1910 12/27/18 1914 12/27/18 2018  BP: 99/72   (!) 171/64  Pulse:  69 69 69  Resp:    18  Temp:      TempSrc:      SpO2:  98% 97% 99%   General: Not in acute distress HEENT:       Eyes: PERRL, EOMI, no scleral icterus. Has puffy eyes.       ENT: No discharge from the ears and nose, no pharynx injection, no tonsillar enlargement.        Neck: No JVD, no bruit, no mass felt. Heme: No neck lymph node enlargement. Cardiac: S1/S2, RRR, No murmurs, No gallops or rubs. Respiratory: No rales, wheezing, rhonchi or rubs. GI: Soft, nondistended, nontender, no rebound pain, no  organomegaly, BS present. GU: No hematuria Ext: has trace leg edema bilaterally. 2+DP/PT pulse bilaterally. Musculoskeletal: No joint deformities, No joint redness or warmth, no limitation of ROM in spin. Skin: No rashes.  Neuro: mildly confused, but still oriented X3, cranial nerves II-XII grossly intact, moves all extremities normally Psych: Patient is not psychotic, no suicidal or hemocidal ideation.  Labs on Admission: I have personally reviewed following labs and imaging studies  CBC: Recent Labs  Lab 12/27/18 1834  WBC 7.7  NEUTROABS 5.2  HGB 11.2*  HCT 36.6  MCV 92.4  PLT 238   Basic Metabolic Panel: Recent Labs  Lab 12/27/18 1834  NA 122*  K 4.0  CL <65*  CO2 43*  GLUCOSE 83  BUN 19  CREATININE 0.70  CALCIUM 9.5   GFR: CrCl cannot be calculated (Unknown ideal weight.). Liver Function Tests: No results for input(s): AST, ALT, ALKPHOS, BILITOT, PROT, ALBUMIN in the last 168 hours. No results for input(s): LIPASE, AMYLASE in the last 168 hours. No results for input(s): AMMONIA in the last 168 hours. Coagulation Profile: No results for input(s): INR, PROTIME in the last 168 hours. Cardiac Enzymes: No results for input(s): CKTOTAL, CKMB, CKMBINDEX, TROPONINI in the last 168 hours. BNP (last 3 results) No results for input(s): PROBNP in the last 8760 hours. HbA1C: No results for input(s): HGBA1C in the last 72 hours. CBG: No results for input(s): GLUCAP in the last 168 hours. Lipid Profile: No results for input(s): CHOL, HDL, LDLCALC, TRIG, CHOLHDL, LDLDIRECT in the last 72 hours. Thyroid Function Tests: No results for input(s): TSH, T4TOTAL, FREET4, T3FREE, THYROIDAB in the last 72 hours. Anemia Panel: No results for input(s): VITAMINB12, FOLATE, FERRITIN, TIBC, IRON, RETICCTPCT in the last 72 hours. Urine analysis:    Component Value Date/Time   COLORURINE STRAW (A) 06/25/2018 1202   APPEARANCEUR CLEAR 06/25/2018 1202   LABSPEC 1.006 06/25/2018 1202    PHURINE 7.0 06/25/2018 1202   GLUCOSEU NEGATIVE 06/25/2018 1202   HGBUR NEGATIVE 06/25/2018 1202   BILIRUBINUR NEGATIVE 06/25/2018 1202   KETONESUR NEGATIVE 06/25/2018 1202   PROTEINUR NEGATIVE 06/25/2018 1202   UROBILINOGEN  0.2 01/11/2014 1951   NITRITE NEGATIVE 06/25/2018 1202   LEUKOCYTESUR TRACE (A) 06/25/2018 1202   Sepsis Labs: @LABRCNTIP (procalcitonin:4,lacticidven:4) )No results found for this or any previous visit (from the past 240 hour(s)).   Radiological Exams on Admission: Dg Chest 2 View  Result Date: 12/27/2018 CLINICAL DATA:  85 year old female with a history of feet swelling and cough EXAM: CHEST - 2 VIEW COMPARISON:  Multiple prior chest x-ray most recent 06/10/2018, CT 12/21/2017 FINDINGS: Cardiomediastinal silhouette unchanged. Similar appearance of significant elevation of the right hemidiaphragm which obscures the right heart border. Patchy airspace opacity in the right suprahilar region along the midline. No pneumothorax.  No large pleural effusion. Degenerative changes of the shoulders. Degenerative changes of the spine IMPRESSION: Patchy airspace in the right suprahilar region concerning for pneumonia versus chronic atelectasis/scarring. Unchanged right hemidiaphragm chronic elevation with obscuration the right heart border. Electronically Signed   By: Gilmer Mor D.O.   On: 12/27/2018 14:23   Ct Chest Wo Contrast  Result Date: 12/27/2018 CLINICAL DATA:  Cough for 2 weeks EXAM: CT CHEST WITHOUT CONTRAST TECHNIQUE: Multidetector CT imaging of the chest was performed following the standard protocol without IV contrast. COMPARISON:  12/21/2017 and chest radiograph from 12/27/2018 FINDINGS: Cardiovascular: Atherosclerotic calcification thoracic aorta and branch vessels. Mediastinum/Nodes: Unremarkable Lungs/Pleura: Elevated right hemidiaphragm. Scarring posteriorly in the right upper lobe. Atelectasis of much of the right lower lobe attributable to the elevated right  hemidiaphragm and similar to the 12/21/2017 exam. No pleural effusion. In the right suprahilar region there is some confluent vasculature and a calcified granuloma, but no airspace opacity is identified; the appearance on radiography is thought to be due to superimposed vascular shadows. Upper Abdomen: Abdominal aortic atherosclerosis. Musculoskeletal: Thoracic spondylosis. IMPRESSION: 1. The region of concern in the right suprahilar area on prior chest radiography is due to superimposed vessels. There is no worrisome airspace opacity in this region. 2. Elevated right hemidiaphragm with chronic atelectasis of the right lower lobe. 3.  Aortic Atherosclerosis (ICD10-I70.0). Electronically Signed   By: Gaylyn Rong Fernandez.D.   On: 12/27/2018 16:44     EKG: Independently reviewed.    Assessment/Plan Principal Problem:   Hyponatremia Active Problems:   GERD (gastroesophageal reflux disease)   History of CVA (cerebrovascular accident)   Anxiety state   Essential hypertension   Hypothyroidism   Chronic diastolic CHF (congestive heart failure) (HCC)   Severe pulmonary arterial systolic hypertension (HCC)   CAD (coronary artery disease)   Hypochloremic alkalosis   Acute metabolic encephalopathy   Hypertensive urgency   Cough   Hyponatremia: Sodium 122.  Patient had history of hyponatremia, this is a recurrent issue.  Patient was hospitalized from 1/17-1/27/2019 due to hyponatremia, which was believed to be likely due to SIADH.  Patient was put on fluid restriction and demeclocycline.  -will admit to tele bed for obs - Will check urine sodium, urine osmolality, serum osmolality. - check TSH - Fluid restriction to 1000 ml - continue demeclocycline - Start sodium chloride tablet, 1 g twice daily - Hold lisinopril - IVF: 500 cc NS in ED - f/u by BMP q6h-->will check BMP at 11:59 PM for Na level, then decide for next NS IVF rate  Hypochloremic alkalosis: -IVF NS as above  History of  hypertension and hypertensive urgency: Blood pressure 245/93--> 171/64. -Continue home propranolol -Hold lisinopril due to hyponatremia -And oral hydralazine 50 mg 3 times daily -Add amlodipine 10 mg daily (patient was on amlodipine, but stopped taking this medication recently) -IV  hydralazine as needed  GERD: -Protonix -Pepcid  History of CVA (cerebrovascular accident) -on ASA  Anxiety state: -continue xanax, but changed from 0.25 prn tid to bid prn  Hypothyroidism: Last TSH was 1.490 on 09/05/18 -Continue home Synthroid  Chronic diastolic CHF in the setting of severe pulmonary arterial systolic hypertension:  2D echo on 12/20/2017 showed EF of 60-85% with grade 2 diastolic dysfunction.  Patient has trace amount of leg edema, no JVD.  No pulmonary edema on chest x-ray.  BNP 175.  Patient has mild cough, and a mild shortness of breath, but no respiratory distress, no oxygen desaturation on home 2 L nasal cannula oxygen.  CHF seems to be compensated. - Hold off diuretics due to hyponatremia  CAD (coronary artery disease): no chest pain -continue ASA  Acute metabolic encephalopathy: Patient has mild confusion, but is still orientated x3.  Likely multifactorial etiology, including severe electrolyte disturbance, hypertensive urgency.  No focal neurological findings on physical examination. -Frequent neuro check   Inpatient status:  # Patient requires inpatient status due to high intensity of service, high risk for further deterioration and high frequency of surveillance required.  I certify that at the point of admission it is my clinical judgment that the patient will require inpatient hospital care spanning beyond 2 midnights from the point of admission.  . This patient has multiple chronic comorbidities including SIADH, hyponatremia, hypertension, hyperlipidemia, home oxygen use (2 L), stroke, GERD, depression, anxiety, dCHF, pulmonary hypertension, CAD, non-STEMI, PFO, pulmonary  hypertension, obesity . Now patient has presenting symptoms include cough, leg edema, confusion, hypertensive urgency, . The worrisome physical exam findings include mild confusion, elevated blood pressure . The initial radiographic and laboratory data are worrisome because of hyponatremia, hypochloremic alkalosis,. . Current medical needs: please see my assessment and plan . Predictability of an adverse outcome (risk): Patient has multiple comorbidities, now presents with severe electrolyte disturbance including hyponatremia with sodium 122, hypochloremic alkalosis, hypertensive urgency.  Given his sodium level 122, will need to be treated in hospital for at least 48 hours (cannot overcorrect too fast).    DVT ppx: SQ Lovenox Code Status: Full code Family Communication: None at bed side.     Disposition Plan:  Anticipate discharge back to previous home environment Consults called:  none Admission status:   Inpatient/tele     Date of Service 12/27/2018    Lorretta Harp [facility]   If 7PM-7AM, please contact night-coverage [email or web address] Password [email or web address] 12/27/2018, 10:07 PM

## 2018-12-27 NOTE — ED Provider Notes (Signed)
[facility]-EMERGENCY DEPT Provider Note   CSN: [id number] Arrival date & time: 12/27/18  1303     History   Chief Complaint Chief Complaint  Patient presents with  . Joint Swelling  . Cough    HPI Tammy Fernandez is a 85 y.o. female with past medical history of hypertension, GERD, chronic diastolic CHF, CVA, CAD, hyponatremia, chronic respiratory failure with hypoxia requiring O2, presenting to the emergency department with family with complaints of lower extremity swelling that began a few days ago and has been improving and cough.  Patient son states a few days ago he noticed her feet were swollen, and called the PCP who recommended elevation.  The elevation has helped, swelling has decreased.  Also reports gradually improving cough since recent sinus infection a few weeks ago, which was treated with Augmentin.  Patient states her cough is getting better, sometimes productive of a clear sputum.  She does not feel short of breath or dyspnea on exertion, however has not been very ambulatory this past month. No increase in O2 requirement. She states she does not feel ill. Unable to be seen by PCP until next week so they reported here. No fevers, SOB, CP, urinary sx.  She did take her BP medication this morning.   The history is provided by the patient and a relative.    Past Medical History:  Diagnosis Date  . Anxiety   . Chronic lower back pain   . Constipation    h/o fecal disimpaction on 2017  . CVA (cerebral vascular accident) (HCC) 2015   neg workup, MRI small acute lacunar infarcts. ASA only  . Depression   . Diastolic CHF, chronic (HCC)    grade I, most recent echo Jan 2019  . Fatty liver   . GERD (gastroesophageal reflux disease)   . Hyperlipidemia   . Hypertension   . Hyponatremia    multiple episodes with encephalopathy, renal thinks SIADH  . Hypothyroidism, unspecified   . Internal hemorrhoid   . NSTEMI (non-ST elevated myocardial infarction)  (HCC) 08/28/2016  . Palpitations   . PFO with atrial septal aneurysm    TTE 2015  . Pneumonia 2016  . Severe pulmonary arterial systolic hypertension (HCC) 12/25/2017   per echo, on 1L oxgen via [place]    Patient Active Problem List   Diagnosis Date Noted  . CAD (coronary artery disease) 12/27/2018  . Hypochloremic alkalosis 12/27/2018  . Acute metabolic encephalopathy 12/27/2018  . Hypertensive urgency 12/27/2018  . Cough 12/27/2018  . Oxygen dependent 06/10/2018  . Chest pain, atypical 05/23/2018  . Chronic respiratory failure with hypoxia (HCC) 05/23/2018  . Long term prescription benzodiazepine use 04/23/2018  . Hyperlipidemia   . Chronic diastolic CHF (congestive heart failure) (HCC) 12/25/2017  . Severe pulmonary arterial systolic hypertension (HCC) 12/25/2017  . Essential hypertension 08/29/2016  . Hypothyroidism 08/29/2016  . Abnormal EKG   . Elevated troponin   . NSTEMI (non-ST elevated myocardial infarction) (HCC) 08/28/2016  . Decubitus ulcer of sacral region, stage 3 (HCC) 12/18/2015  . Klebsiella cystitis 12/16/2015  . Acute delirium 12/16/2015  . Aspiration pneumonia (HCC) 12/16/2015  . Leukocytosis   . Hypokalemia   . Encounter for central line placement   . Anxiety state   . Essential hypertension   . Physical deconditioning   . Altered mental status   . Chronic diarrhea   . Hyponatremia   . CAP (community acquired pneumonia) 10/29/2015  . History of CVA (cerebrovascular accident) 01/19/2014  .  Dysphagia, post-stroke 01/15/2014  . Bacteremia due to Staphylococcus 01/13/2014  . Left hemiplegia (HCC) 01/12/2014  . Hypertension 01/11/2014  . Acute hyponatremia 01/11/2014  . Hyperkalemia 01/11/2014  . Hematuria 01/11/2014  . E. coli UTI (urinary tract infection) 01/11/2014  . GERD (gastroesophageal reflux disease) 11/14/2011  . Obesities, morbid (HCC) 11/14/2011    Past Surgical History:  Procedure Laterality Date  . ABDOMINAL HYSTERECTOMY    .  CATARACT EXTRACTION W/ INTRAOCULAR LENS  IMPLANT, BILATERAL Bilateral   . CESAREAN SECTION    . TEE WITHOUT CARDIOVERSION N/A 01/19/2014   Procedure: TRANSESOPHAGEAL ECHOCARDIOGRAM (TEE);  Surgeon: Laurey Morale, MD;  Location: [facility] ENDOSCOPY;  Service: Cardiovascular;  Laterality: N/A;  [name]  . VAGINAL HYSTERECTOMY       OB History   No obstetric history on file.      Home Medications    Prior to Admission medications   Medication Sig Start Date End Date Taking? Authorizing Provider  albuterol (PROVENTIL) (2.5 MG/3ML) 0.083% nebulizer solution Take 3 mLs (2.5 mg total) by nebulization every 4 (four) hours as needed for wheezing or shortness of breath. 10/08/18  Yes Icard, Rachel Bo, DO  ALPRAZolam (XANAX) 1 MG tablet TAKE 1 TABLET BY MOUTH TWICE DAILY Patient taking differently: Take 0.25 mg by mouth 3 (three) times daily.  10/02/18  Yes Myles Lipps, MD  Aspirin 81 MG EC tablet Take 81 mg by mouth daily.   Yes [provider]  cetirizine (ZYRTEC) 10 MG tablet Take 10 mg by mouth daily.   Yes [provider]  demeclocycline (DECLOMYCIN) 300 MG tablet TK 1/2 T PO BID 12/04/18  Yes Myles Lipps, MD  docusate sodium (COLACE) 100 MG capsule Take 100 mg by mouth daily as needed for mild constipation.   Yes [provider]  fluticasone (FLONASE) 50 MCG/ACT nasal spray Place 1 spray into both nostrils 2 (two) times daily. 11/13/18  Yes Myles Lipps, MD  Glycerin, Laxative, (ADULT SUPPOSITORY RE) Place 1 Dose rectally as needed (costipation).   Yes [provider]  levothyroxine (SYNTHROID, LEVOTHROID) 50 MCG tablet Take 1 tablet (50 mcg total) by mouth daily before breakfast. 09/16/18  Yes Myles Lipps, MD  lisinopril (PRINIVIL,ZESTRIL) 5 MG tablet Take 2 tablets (10 mg total) by mouth daily. Patient taking differently: Take 5 mg by mouth daily.  06/25/18  Yes Mesner, Barbara Cower, MD  meclizine (ANTIVERT) 12.5 MG tablet Take 1 tablet by mouth as  needed for dizziness 12/20/18  Yes Stallings, Zoe A, MD  olopatadine (PATANOL) 0.1 % ophthalmic solution Place 1 drop into both eyes daily as needed for allergies. 03/25/18  Yes Myles Lipps, MD  omeprazole (PRILOSEC) 20 MG capsule Take 20 mg by mouth every evening.  12/09/18  Yes [provider]  propranolol ER (INDERAL LA) 60 MG 24 hr capsule TAKE 1 CAPSULE BY MOUTH DAILY 11/13/18  Yes Myles Lipps, MD  ranitidine (ZANTAC) 300 MG tablet Take 300 mg by mouth at bedtime.  10/12/18  Yes [provider]  amLODipine (NORVASC) 5 MG tablet Take 1 tablet (5 mg total) by mouth daily. Patient not taking: Reported on 12/20/2018 07/08/18   Myles Lipps, MD  azelastine (ASTELIN) 0.1 % nasal spray Place 1 spray into both nostrils 2 (two) times daily. Use in each nostril as directed Patient not taking: Reported on 12/27/2018 05/29/18   Myles Lipps, MD  famotidine (PEPCID) 20 MG tablet Take 1 tablet (20 mg total) by mouth  daily. Take 1 tablet at Bedtime daily. Patient not taking: Reported on 12/20/2018 12/16/18   Myles LippsSantiago, Irma M, MD  fluconazole (DIFLUCAN) 150 MG tablet Take 1 tablet (150 mg total) by mouth every 3 (three) days. Patient not taking: Reported on 12/27/2018 12/20/18   Doristine BosworthStallings, Zoe A, MD  pantoprazole (PROTONIX) 40 MG tablet Take 1 tablet (40 mg total) by mouth daily. Patient not taking: Reported on 12/27/2018 12/04/18   Myles LippsSantiago, Irma M, MD    Family History Family History  Problem Relation Age of Onset  . Stroke Father   . Hypertension Father   . Colon cancer Cousin   . Colon cancer Other        Aunt  . Diabetes Other        aunt and cousins  . Heart disease Other        cousins  . Healthy Daughter   . Healthy Son     Social History Social History   Tobacco Use  . Smoking status: Never Smoker  . Smokeless tobacco: Former NeurosurgeonUser    Types: Snuff  . Tobacco comment: "used snuff when I was 19"  Substance Use Topics  . Alcohol use: No  . Drug use: No      Allergies   Crestor [rosuvastatin calcium]; Keflex [cephalexin]; Peanut butter flavor; Sulfa antibiotics; Cephalexin; and Sulfa drugs cross reactors   Review of Systems Review of Systems  Constitutional: Negative for fever.  HENT: Negative for congestion and sore throat.   Respiratory: Positive for cough. Negative for chest tightness and shortness of breath.   Cardiovascular: Positive for leg swelling. Negative for chest pain.  All other systems reviewed and are negative.    Physical Exam Updated Vital Signs BP (!) 193/63 (BP Location: Right Arm) Comment: Simultaneous filing. User may not have seen previous data.  Pulse 73   Temp 98.5 F (36.9 C) (Oral)   Resp 19   SpO2 99%   Physical Exam Vitals signs and nursing note reviewed.  Constitutional:      General: She is not in acute distress.    Appearance: She is well-developed. She is not toxic-appearing.  HENT:     Head: Normocephalic and atraumatic.     Mouth/Throat:     Mouth: Mucous membranes are moist.  Eyes:     Conjunctiva/sclera: Conjunctivae normal.  Neck:     Musculoskeletal: Normal range of motion and neck supple.  Cardiovascular:     Rate and Rhythm: Normal rate.  Pulmonary:     Effort: Pulmonary effort is normal.     Comments: Diminished b/l, no wheezes or rales. Abdominal:     General: Bowel sounds are normal. There is no distension.     Palpations: Abdomen is soft.     Tenderness: There is no abdominal tenderness. There is no guarding.  Musculoskeletal:     Comments: Mild pedal edema b/l. No erythema or tenderness.  Skin:    General: Skin is warm.  Neurological:     Mental Status: She is alert.  Psychiatric:        Behavior: Behavior normal.      ED Treatments / Results  Labs (all labs ordered are listed, but only abnormal results are displayed) Labs Reviewed  BASIC METABOLIC PANEL - Abnormal; Notable for the following components:      Result Value   Sodium 122 (*)    Chloride <65  (*)    CO2 43 (*)    All other components within normal limits  CBC WITH DIFFERENTIAL/PLATELET - Abnormal; Notable for the following components:   Hemoglobin 11.2 (*)    All other components within normal limits  BRAIN NATRIURETIC PEPTIDE - Abnormal; Notable for the following components:   B Natriuretic Peptide 175.8 (*)    All other components within normal limits  OSMOLALITY  OSMOLALITY, URINE  CREATININE, URINE, RANDOM  SODIUM, URINE, RANDOM  MAGNESIUM  TSH  BASIC METABOLIC PANEL  BASIC METABOLIC PANEL  CBC    EKG EKG Interpretation  Date/Time:  Saturday December 27 2018 20:48:24 EST Ventricular Rate:  71 PR Interval:    QRS Duration: 86 QT Interval:  401 QTC Calculation: 436 R Axis:   45 Text Interpretation:  Sinus rhythm Abnormal T, consider ischemia, diffuse leads No significant change since last tracing Confirmed by Frederick Peers [phone]) on 12/27/2018 9:12:11 PM   Radiology Dg Chest 2 View  Result Date: 12/27/2018 CLINICAL DATA:  85 year old female with a history of feet swelling and cough EXAM: CHEST - 2 VIEW COMPARISON:  Multiple prior chest x-ray most recent 06/10/2018, CT 12/21/2017 FINDINGS: Cardiomediastinal silhouette unchanged. Similar appearance of significant elevation of the right hemidiaphragm which obscures the right heart border. Patchy airspace opacity in the right suprahilar region along the midline. No pneumothorax.  No large pleural effusion. Degenerative changes of the shoulders. Degenerative changes of the spine IMPRESSION: Patchy airspace in the right suprahilar region concerning for pneumonia versus chronic atelectasis/scarring. Unchanged right hemidiaphragm chronic elevation with obscuration the right heart border. Electronically Signed   By: Gilmer Mor D.O.   On: 12/27/2018 14:23   Ct Chest Wo Contrast  Result Date: 12/27/2018 CLINICAL DATA:  Cough for 2 weeks EXAM: CT CHEST WITHOUT CONTRAST TECHNIQUE: Multidetector CT imaging of the chest was  performed following the standard protocol without IV contrast. COMPARISON:  12/21/2017 and chest radiograph from 12/27/2018 FINDINGS: Cardiovascular: Atherosclerotic calcification thoracic aorta and branch vessels. Mediastinum/Nodes: Unremarkable Lungs/Pleura: Elevated right hemidiaphragm. Scarring posteriorly in the right upper lobe. Atelectasis of much of the right lower lobe attributable to the elevated right hemidiaphragm and similar to the 12/21/2017 exam. No pleural effusion. In the right suprahilar region there is some confluent vasculature and a calcified granuloma, but no airspace opacity is identified; the appearance on radiography is thought to be due to superimposed vascular shadows. Upper Abdomen: Abdominal aortic atherosclerosis. Musculoskeletal: Thoracic spondylosis. IMPRESSION: 1. The region of concern in the right suprahilar area on prior chest radiography is due to superimposed vessels. There is no worrisome airspace opacity in this region. 2. Elevated right hemidiaphragm with chronic atelectasis of the right lower lobe. 3.  Aortic Atherosclerosis (ICD10-I70.0). Electronically Signed   By: Gaylyn Rong M.D.   On: 12/27/2018 16:44    Procedures Procedures (including critical care time)  Medications Ordered in ED Medications  hydrALAZINE (APRESOLINE) injection 5 mg (has no administration in time range)  hydrALAZINE (APRESOLINE) tablet 50 mg (50 mg Oral Given 12/27/18 2217)  dextromethorphan-guaiFENesin (MUCINEX DM) 30-600 MG per 12 hr tablet 1 tablet (has no administration in time range)  albuterol (PROVENTIL) (2.5 MG/3ML) 0.083% nebulizer solution 2.5 mg (has no administration in time range)  enoxaparin (LOVENOX) injection 40 mg (has no administration in time range)  acetaminophen (TYLENOL) tablet 650 mg (has no administration in time range)    Or  acetaminophen (TYLENOL) suppository 650 mg (has no administration in time range)  senna-docusate (Senokot-S) tablet 1 tablet (has no  administration in time range)  ondansetron (ZOFRAN) tablet 4 mg (has no administration in time range)  Or  ondansetron (ZOFRAN) injection 4 mg (has no administration in time range)  Aspirin 81 mg (has no administration in time range)  demeclocycline (DECLOMYCIN) tablet 150 mg (has no administration in time range)  propranolol ER (INDERAL LA) 24 hr capsule 60 mg (has no administration in time range)  levothyroxine (SYNTHROID, LEVOTHROID) tablet 50 mcg (has no administration in time range)  docusate sodium (COLACE) capsule 100 mg (has no administration in time range)  meclizine (ANTIVERT) tablet 12.5 mg (has no administration in time range)  pantoprazole (PROTONIX) EC tablet 40 mg (has no administration in time range)  famotidine (PEPCID) tablet 20 mg (has no administration in time range)  loratadine (CLARITIN) tablet 10 mg (has no administration in time range)  fluticasone (FLONASE) 50 MCG/ACT nasal spray 1 spray (has no administration in time range)  olopatadine (PATANOL) 0.1 % ophthalmic solution 1 drop (has no administration in time range)  sodium chloride tablet 1 g (has no administration in time range)  amLODipine (NORVASC) tablet 10 mg (has no administration in time range)  ALPRAZolam (XANAX) tablet 0.25 mg (has no administration in time range)  sodium chloride flush (NS) 0.9 % injection 3 mL (3 mLs Intravenous Given 12/27/18 1813)  labetalol (NORMODYNE,TRANDATE) injection 10 mg (10 mg Intravenous Given 12/27/18 1812)  hydrALAZINE (APRESOLINE) injection 10 mg (10 mg Intravenous Given 12/27/18 1812)  sodium chloride 0.9 % bolus 500 mL (500 mLs Intravenous New Bag/Given 12/27/18 2201)     Initial Impression / Assessment and Plan / ED Course  I have reviewed the triage vital signs and the nursing notes.  Pertinent labs & imaging results that were available during my care of the patient were reviewed by me and considered in my medical decision making (see chart for details).     Pt  presenting with complaint of b/l feet swelling and cough, both are improving. Unable to see PCP until next week so reported to ED. No SOB, CP, fever. No change in O2 requirement. Pt states she does not feel ill. Pt's son expresses concerns for her sodium level and to be sure that she is not having CHF exacerbation.  On exam, patient is hypertensive though states she is taking her morning medications.  Lung sounds diminished bilaterally, however normal work of breathing.  Oxygen saturation on 2 L nasal cannula, this is baseline O2 requirement.  Abdomen is benign.  Mild pedal edema bilaterally.  Chest x-ray obtained in triage showing patchy airspace in the right region, pneumonia versus chronic atelectasis.  Patient discussed with Dr. Patria Mane.  Will obtain basic labs and CT chest to distinguish CXR findings.  Hypertension treated.  CT reveals no worrisome airspace disease, area of concern on chest x-ray is due to superimposed vessels.  Labs significantly delayed as multiple times for IV were unsuccessful.  Once able to obtain successful IV and send labs, labs revealing significant hyponatremia with a sodium of 122.  Hypochloremic alkalosis is also present with a chloride less than 65 and a bicarb elevated to 43.  Unsure of etiology of significant electrolyte derangements.  EKG unchanged from baseline.  Normal saline bolus ordered.  CBC is reassuring.  BNP is unremarkable.  Hypertension treated with some improvement. PT evaluated by Dr. Clarene Duke.   Hospitalist consulted for admission, Dr.Niu accepting admission.  The patient appears reasonably stabilized for admission considering the current resources, flow, and capabilities available in the ED at this time, and I doubt any other [facility] requiring further screening and/or treatment in the ED prior to  admission.  Final Clinical Impressions(s) / ED Diagnoses   Final diagnoses:  Hypochloremic alkalosis  Hyponatremia  Hypertension, unspecified type  Pedal edema   Cough    ED Discharge Orders    None       , [place] N, PA-C 12/27/18 2306    Little, Ambrose Finlandachel Morgan, MD 12/28/18 1536

## 2018-12-27 NOTE — ED Notes (Signed)
Celeste, RN informed of Chloride critical <65.  Celeste to inform EDP.

## 2018-12-28 ENCOUNTER — Other Ambulatory Visit: Payer: Self-pay

## 2018-12-28 LAB — BASIC METABOLIC PANEL
Anion gap: 12 (ref 5–15)
BUN: 19 mg/dL (ref 8–23)
BUN: 22 mg/dL (ref 8–23)
CO2: 43 mmol/L — ABNORMAL HIGH (ref 22–32)
CO2: 44 mmol/L — ABNORMAL HIGH (ref 22–32)
CREATININE: 0.63 mg/dL (ref 0.44–1.00)
Calcium: 9.3 mg/dL (ref 8.9–10.3)
Calcium: 9.1 mg/dL (ref 8.9–10.3)
Chloride: <65 mmol/L — CL (ref 98–111)
Chloride: 67 mmol/L — ABNORMAL LOW (ref 98–111)
Creatinine, Ser: 0.59 mg/dL (ref 0.44–1.00)
GFR calc Af Amer: >60 mL/min (ref >60)
GFR calc Af Amer: >60 mL/min (ref >60)
GFR calc non Af Amer: >60 mL/min (ref >60)
GFR calc non Af Amer: >60 mL/min (ref >60)
Glucose, Bld: 85 mg/dL (ref 70–99)
Glucose, Bld: 87 mg/dL (ref 70–99)
Potassium: 3.7 mmol/L (ref 3.5–5.1)
Potassium: 3.5 mmol/L (ref 3.5–5.1)
Sodium: 121 mmol/L — ABNORMAL LOW (ref 135–145)
Sodium: 123 mmol/L — ABNORMAL LOW (ref 135–145)

## 2018-12-28 LAB — CBC
HCT: 33 % — ABNORMAL LOW (ref 36.0–46.0)
Hemoglobin: 10.2 g/dL — ABNORMAL LOW (ref 12.0–15.0)
MCH: 29 pg (ref 26.0–34.0)
MCHC: 30.9 g/dL (ref 30.0–36.0)
MCV: 93.8 fL (ref 80.0–100.0)
Platelets: 235 10*3/uL (ref 150–400)
RBC: 3.52 MIL/uL — ABNORMAL LOW (ref 3.87–5.11)
RDW: 12.5 % (ref 11.5–15.5)
WBC: 10.2 10*3/uL (ref 4.0–10.5)
nRBC: 0 % (ref 0.0–0.2)

## 2018-12-28 LAB — OSMOLALITY, URINE: Osmolality, Ur: 471 mOsm/kg (ref 300–900)

## 2018-12-28 LAB — MAGNESIUM: Magnesium: 1.2 mg/dL — ABNORMAL LOW (ref 1.7–2.4)

## 2018-12-28 LAB — SODIUM
Sodium: 123 mmol/L — ABNORMAL LOW (ref 135–145)
Sodium: 122 mmol/L — ABNORMAL LOW (ref 135–145)
Sodium: 121 mmol/L — ABNORMAL LOW (ref 135–145)

## 2018-12-28 LAB — OSMOLALITY: OSMOLALITY: 258 mosm/kg — AB (ref 275–295)

## 2018-12-28 LAB — TSH: TSH: 0.647 u[IU]/mL (ref 0.350–4.500)

## 2018-12-28 LAB — SODIUM, URINE, RANDOM: Sodium, Ur: 44 mmol/L

## 2018-12-28 LAB — CREATININE, URINE, RANDOM: Creatinine, Urine: 85.7 mg/dL

## 2018-12-28 MED ORDER — LISINOPRIL 2.5 MG PO TABS
5.0000 mg | ORAL_TABLET | Freq: Every day | ORAL | Status: DC
  Administered 2018-12-28 – 2018-12-31 (×3): 5 mg via ORAL
  Filled 2018-12-28: qty 2
  Filled 2018-12-28 (×2): qty 1

## 2018-12-28 MED ORDER — ALUM & MAG HYDROXIDE-SIMETH 200-200-20 MG/5ML PO SUSP
15.0000 mL | ORAL | Status: DC | PRN
  Administered 2018-12-28: 15 mL via ORAL
  Filled 2018-12-28: qty 30

## 2018-12-28 MED ORDER — SENNOSIDES-DOCUSATE SODIUM 8.6-50 MG PO TABS: 1.0000 | ORAL_TABLET | Freq: Every evening | ORAL | Status: DC | PRN

## 2018-12-28 MED ORDER — MAGNESIUM CITRATE PO SOLN
1.0000 | Freq: Every day | ORAL | Status: DC | PRN
  Administered 2018-12-29: 1 via ORAL
  Filled 2018-12-28: qty 296

## 2018-12-28 MED ORDER — TOLVAPTAN 15 MG PO TABS
15.0000 mg | ORAL_TABLET | ORAL | Status: DC
  Administered 2018-12-28 – 2018-12-30 (×3): 15 mg via ORAL
  Filled 2018-12-28 (×4): qty 1

## 2018-12-28 MED ORDER — SODIUM CHLORIDE 1 G PO TABS
2.0000 g | ORAL_TABLET | Freq: Three times a day (TID) | ORAL | Status: DC
  Administered 2018-12-28: 2 g via ORAL
  Filled 2018-12-28 (×2): qty 2

## 2018-12-28 MED ORDER — MAGNESIUM SULFATE 2 GM/50ML IV SOLN
2.0000 g | Freq: Once | INTRAVENOUS | Status: AC
  Administered 2018-12-28: 2 g via INTRAVENOUS
  Filled 2018-12-28: qty 50

## 2018-12-28 MED ORDER — DOCUSATE SODIUM 100 MG PO CAPS
100.0000 mg | ORAL_CAPSULE | Freq: Every day | ORAL | Status: DC | PRN
  Administered 2018-12-28 – 2019-01-08 (×3): 100 mg via ORAL
  Filled 2018-12-28 (×3): qty 1

## 2018-12-28 MED ORDER — SODIUM CHLORIDE 0.9 % IV SOLN
INTRAVENOUS | Status: DC
  Administered 2018-12-28: 02:00:00 via INTRAVENOUS

## 2018-12-28 NOTE — Progress Notes (Signed)
Dr Clearnce Sorrel made aware of pt's repeat Na level of 122 (previously 123). Spoke with MD on phone and new orders reviewed.

## 2018-12-28 NOTE — Plan of Care (Signed)
  Problem: Education: Goal: Knowledge of General Education information will improve Description Including pain rating scale, medication(s)/side effects and non-pharmacologic comfort measures Outcome: Progressing   Problem: Health Behavior/Discharge Planning: Goal: Ability to manage health-related needs will improve Outcome: Progressing   Problem: Clinical Measurements: Goal: Ability to maintain clinical measurements within normal limits will improve Outcome: Progressing Goal: Will remain free from infection Outcome: Progressing Goal: Diagnostic test results will improve Outcome: Progressing Note:  NA level slowly improving.  Goal: Respiratory complications will improve Outcome: Progressing Goal: Cardiovascular complication will be avoided Outcome: Progressing   Problem: Activity: Goal: Risk for activity intolerance will decrease Outcome: Progressing Note:  Await PT eval.    Problem: Nutrition: Goal: Adequate nutrition will be maintained Outcome: Progressing   Problem: Elimination: Goal: Will not experience complications related to bowel motility Outcome: Progressing Note:  PRN colace, senna given.    Problem: Safety: Goal: Ability to remain free from injury will improve Outcome: Progressing   Problem: Skin Integrity: Goal: Risk for impaired skin integrity will decrease Outcome: Progressing

## 2018-12-28 NOTE — Evaluation (Signed)
Physical Therapy Evaluation Patient Details Name: Tammy Fernandez MRN: [id number] DOB: 20-Nov-1924 Today's Date: 12/28/2018   History of Present Illness  85 year old with past medical history relevant for SIADH on Depakote cycling, hypothyroidism, hypertension, hyperlipidemia, COPD on 2 liters chronically, history of CVA, grade 2 diastolic dysfunction and severe pulmonary hypertension by echo on 12/20/2017, admitted with acute metabolic encephalopathy due to hyponatremia and hypertension.  Clinical Impression  Pt admitted with above diagnosis. Pt currently with functional limitations due to the deficits listed below (see PT Problem List). Pt limited by fatigue and mild confusion, overall min/mod assist today; recommend HHPT, family agreeable and prefers this plan currently; will continue to follow in acute setting.  Pt will benefit from skilled PT to increase their independence and safety with mobility to allow discharge to the venue listed below.       Follow Up Recommendations Home health PT;Supervision/Assistance - 24 hour    Equipment Recommendations  None recommended by PT    Recommendations for Other Services       Precautions / Restrictions Precautions Precautions: Fall Restrictions Weight Bearing Restrictions: No      Mobility  Bed Mobility Overal bed mobility: Needs Assistance Bed Mobility: Supine to Sit     Supine to sit: HOB elevated     General bed mobility comments: assist with trunk and LEs, requires excessive amount of time to come to EOB, bed pad used to complete scooting to EOB  Transfers Overall transfer level: Needs assistance Equipment used: Rolling walker (2 wheeled) Transfers: Sit to/from [employer] Sit to Stand: Min assist;Mod assist Stand pivot transfers: Min assist       General transfer comment: assist to rise and stabilize,  incr time needed,  cues for hand placement and overall safety  Ambulation/Gait              General Gait Details: too fatigued  Stairs            Wheelchair Mobility    Modified Rankin (Stroke Patients Only)       Balance Overall balance assessment: Needs assistance   Sitting balance-[name]: Fair       Standing balance-[name]: Poor Standing balance comment: reliant on UEs                             Pertinent Vitals/Pain Pain Assessment: No/denies pain    Home Living Family/patient expects to be discharged to:: Private residence Living Arrangements: Children Available Help at Discharge: Family;Available 24 hours/day Type of Home: House Home Access: Stairs to enter Entrance Stairs-Rails: Right Entrance Stairs-Number of Steps: 4 Home Layout: One level Home Equipment: Bedside commode;Wheelchair - [employer] - 2 wheels;Cane - single point Additional Comments: daughter and som live Engineer, technical sales, has assist 24/7    Prior Function Level of Independence: Needs assistance   Gait / Transfers Assistance Needed: amb short distances in home  however limited amb recently per pt dtr  ADL's / Homemaking Assistance Needed: family assists with sponge bathing  Comments: pt feeds self, family does meals     Hand Dominance        Extremity/Trunk Assessment   Upper Extremity Assessment Upper Extremity Assessment: Overall WFL for tasks assessed    Lower Extremity Assessment Lower Extremity Assessment: Generalized weakness       Communication   Communication: No difficulties  Cognition Arousal/Alertness: Awake/alert Behavior During Therapy: WFL for tasks assessed/performed;Flat affect Overall Cognitive Status: Impaired/Different from baseline Area  of Impairment: Orientation;Following commands;Problem solving                 Orientation Level: Disoriented to;Situation           Problem Solving: Slow processing;Decreased initiation;Difficulty sequencing;Requires verbal cues;Requires tactile cues        General Comments       Exercises     Assessment/Plan    PT Assessment Patient needs continued PT services  PT Problem List Decreased strength;Decreased balance;Decreased activity tolerance;Decreased mobility;Decreased knowledge of use of DME;Decreased cognition       PT Treatment Interventions DME instruction;Functional mobility training;Gait training;Balance training;Patient/family education;Therapeutic activities;Therapeutic exercise    PT Goals (Current goals can be found in the Care Plan section)  Acute Rehab PT Goals Patient Stated Goal: to go home with HHPT  PT Goal Formulation: With patient Time For Goal Achievement: 01/11/19 Potential to Achieve Goals: Good    Frequency Min 3X/week   Barriers to discharge        Co-evaluation               AM-PAC PT "6 Clicks" Mobility  Outcome Measure Help needed turning from your back to your side while in a flat bed without using bedrails?: A Lot Help needed moving from lying on your back to sitting on the side of a flat bed without using bedrails?: A Lot Help needed moving to and from a bed to a chair (including a wheelchair)?: A Lot Help needed standing up from a chair using your arms (e.g., wheelchair or bedside chair)?: A Lot Help needed to walk in hospital room?: A Lot Help needed climbing 3-5 steps with a railing? : Total 6 Click Score: 11    End of Session Equipment Utilized During Treatment: Gait belt Activity Tolerance: Patient limited by fatigue Patient left: in chair;with call bell/phone within reach;with chair alarm set;with family/visitor present;with nursing/sitter in room Nurse Communication: Mobility status PT Visit Diagnosis: Difficulty in walking, not elsewhere classified (R26.2);Unsteadiness on feet (R26.81)    Time: 9030-0923 PT Time Calculation (min) (ACUTE ONLY): 19 min   Charges:   PT Evaluation $PT Eval Low Complexity: 1 Low          Drucilla Chalet, PT  Pager: [phone] Acute Rehab Dept [facility]):  [phone]   12/28/2018   [facility] 12/28/2018, 3:50 PM

## 2018-12-28 NOTE — Progress Notes (Signed)
PROGRESS NOTE    Tammy Fernandez  ZOX:[id number] DOB: 1924-01-27 DOA: 12/27/2018 PCP: Myles Lipps, MD    Brief Narrative: 85 year old with past medical history relevant for SIADH on Depakote cycling, hypothyroidism, hypertension, hyperlipidemia, COPD on 2 liters chronically, history of CVA, grade 2 diastolic dysfunction and severe pulmonary hypertension by echo on 12/20/2017, admitted with acute metabolic encephalopathy due to hyponatremia and hypertension.   Assessment & Plan:   Principal Problem:   Hyponatremia Active Problems:   GERD (gastroesophageal reflux disease)   History of CVA (cerebrovascular accident)   Anxiety state   Essential hypertension   Hypothyroidism   Chronic diastolic CHF (congestive heart failure) (HCC)   Severe pulmonary arterial systolic hypertension (HCC)   CAD (coronary artery disease)   Hypochloremic alkalosis   Acute metabolic encephalopathy   Hypertensive urgency   Cough   #) Acute metabolic encephalopathy due to hyponatremia/SIADH: Patient has a long history of SIADH is on demeclocycline.  On discussion with the patient's son she apparently has been missing doses and so he is taken over giving medications.  She appears to be profoundly hypochloremic but is not any nausea, vomiting, diarrhea or decreased p.o. intake. -Continue demeclocycline 150 mg twice daily -1 L fluid restriction - Frequent sodium checks -Start salt tablets 2 g with meals -Discontinue IV fluids  #) Hypothyroidism: Continue levothyroxine 50 mcg daily  #) Uncontrolled hypertension/hyperlipidemia: - Continue lisinopril 5 mg daily - Continue propranolol 60 mg daily -Continue morphine 5 mg daily  #) History of CVA: -PT consult -Continue aspirin 81 mg  #) COPD on 2 L/pulmonary hypertension severe: Appears to be euvolemic at this time  #) Chronic diastolic grade 2 dysfunction: Euvolemic  #) Pain/psych: -Continue PRN alprazolam  #) GERD: -Continue H2  blocker -Continue PPI  Fluids: Restrict Electrolytes: Per above Nutrition: Heart healthy diet  Prophylaxis: Enoxaparin  Disposition: Pending resolution of metabolic encephalopathy and stabilization of sodium  Full code   Consultants:   None  Procedures:   None  Antimicrobials:   None   Subjective: This morning the patient does not have any complaints.  On discussion with the patient's son he reports that she has been more fatigued and lethargic recently.  She otherwise has not had any chest pain, abdominal pain, nausea, vomiting, diarrhea.  Objective: Vitals:   12/27/18 2318 12/28/18 0001 12/28/18 0200 12/28/18 0604  BP: (!) 184/62  (!) 144/56 (!) 134/49  Pulse: 74   73  Resp: 14   18  Temp: 98.5 F (36.9 C)   98.2 F (36.8 C)  TempSrc: Oral   Oral  SpO2: 95%   98%  Weight:  78.8 kg    Height:  5\' 2"  (1.575 m)      Intake/Output Summary (Last 24 hours) at 12/28/2018 1002 Last data filed at 12/28/2018 0600 Gross per 24 hour  Intake 450 ml  Output 250 ml  Net 200 ml   Filed Weights   12/28/18 0001  Weight: 78.8 kg    Examination:  General exam: Appears calm and comfortable  Respiratory system: Clear to auscultation. Respiratory effort normal. Cardiovascular system: Regular rate and rhythm, no murmurs Gastrointestinal system: Soft, nondistended, no rebound or guarding, plus bowel sounds Central nervous system: Alert and oriented to person, place but not time or situation.  Grossly intact, in all extremities Extremities: 1+ lower extremity edema Skin: No rashes over visible skin Psychiatry: Unable to assess due to underlying condition    Data Reviewed: I have personally reviewed following labs  and imaging studies  CBC: Recent Labs  Lab 12/27/18 1834 12/28/18 0559  WBC 7.7 10.2  NEUTROABS 5.2  --   HGB 11.2* 10.2*  HCT 36.6 33.0*  MCV 92.4 93.8  PLT 238 235   Basic Metabolic Panel: Recent Labs  Lab 12/27/18 1834 12/27/18 2346  12/28/18 0559 12/28/18 0757  NA 122* 121* 123* 123*  K 4.0 3.7 3.5  --   CL <65* <65* 67*  --   CO2 43* 43* 44*  --   GLUCOSE 83 85 87  --   BUN 19 19 22   --   CREATININE 0.70 0.63 0.59  --   CALCIUM 9.5 9.3 9.1  --   MG  --  1.2*  --   --    GFR: Estimated Creatinine Clearance: 43.6 mL/min (by C-G formula based on SCr of 0.59 mg/dL). Liver Function Tests: No results for input(s): AST, ALT, ALKPHOS, BILITOT, PROT, ALBUMIN in the last 168 hours. No results for input(s): LIPASE, AMYLASE in the last 168 hours. No results for input(s): AMMONIA in the last 168 hours. Coagulation Profile: No results for input(s): INR, PROTIME in the last 168 hours. Cardiac Enzymes: No results for input(s): CKTOTAL, CKMB, CKMBINDEX, TROPONINI in the last 168 hours. BNP (last 3 results) No results for input(s): PROBNP in the last 8760 hours. HbA1C: No results for input(s): HGBA1C in the last 72 hours. CBG: No results for input(s): GLUCAP in the last 168 hours. Lipid Profile: No results for input(s): CHOL, HDL, LDLCALC, TRIG, CHOLHDL, LDLDIRECT in the last 72 hours. Thyroid Function Tests: Recent Labs    12/28/18 0559  TSH 0.647   Anemia Panel: No results for input(s): VITAMINB12, FOLATE, FERRITIN, TIBC, IRON, RETICCTPCT in the last 72 hours. Sepsis Labs: No results for input(s): PROCALCITON, LATICACIDVEN in the last 168 hours.  No results found for this or any previous visit (from the past 240 hour(s)).       Radiology Studies: Dg Chest 2 View  Result Date: 12/27/2018 CLINICAL DATA:  85 year old female with a history of feet swelling and cough EXAM: CHEST - 2 VIEW COMPARISON:  Multiple prior chest x-ray most recent 06/10/2018, CT 12/21/2017 FINDINGS: Cardiomediastinal silhouette unchanged. Similar appearance of significant elevation of the right hemidiaphragm which obscures the right heart border. Patchy airspace opacity in the right suprahilar region along the midline. No pneumothorax.  No  large pleural effusion. Degenerative changes of the shoulders. Degenerative changes of the spine IMPRESSION: Patchy airspace in the right suprahilar region concerning for pneumonia versus chronic atelectasis/scarring. Unchanged right hemidiaphragm chronic elevation with obscuration the right heart border. Electronically Signed   By: Gilmer MorJaime  Wagner D.O.   On: 12/27/2018 14:23   Ct Chest Wo Contrast  Result Date: 12/27/2018 CLINICAL DATA:  Cough for 2 weeks EXAM: CT CHEST WITHOUT CONTRAST TECHNIQUE: Multidetector CT imaging of the chest was performed following the standard protocol without IV contrast. COMPARISON:  12/21/2017 and chest radiograph from 12/27/2018 FINDINGS: Cardiovascular: Atherosclerotic calcification thoracic aorta and branch vessels. Mediastinum/Nodes: Unremarkable Lungs/Pleura: Elevated right hemidiaphragm. Scarring posteriorly in the right upper lobe. Atelectasis of much of the right lower lobe attributable to the elevated right hemidiaphragm and similar to the 12/21/2017 exam. No pleural effusion. In the right suprahilar region there is some confluent vasculature and a calcified granuloma, but no airspace opacity is identified; the appearance on radiography is thought to be due to superimposed vascular shadows. Upper Abdomen: Abdominal aortic atherosclerosis. Musculoskeletal: Thoracic spondylosis. IMPRESSION: 1. The region of concern in the  right suprahilar area on prior chest radiography is due to superimposed vessels. There is no worrisome airspace opacity in this region. 2. Elevated right hemidiaphragm with chronic atelectasis of the right lower lobe. 3.  Aortic Atherosclerosis (ICD10-I70.0). Electronically Signed   By: Gaylyn RongWalter  Liebkemann M.D.   On: 12/27/2018 16:44        Scheduled Meds: . amLODipine  10 mg Oral Daily  . aspirin EC  81 mg Oral Daily  . demeclocycline  150 mg Oral Q12H  . enoxaparin (LOVENOX) injection  40 mg Subcutaneous Daily  . famotidine  20 mg Oral QHS  .  fluticasone  1 spray Each Nare BID  . hydrALAZINE  50 mg Oral Q8H  . levothyroxine  50 mcg Oral QAC breakfast  . loratadine  10 mg Oral Daily  . pantoprazole  40 mg Oral Daily  . propranolol ER  60 mg Oral Daily  . sodium chloride  1 g Oral BID WC   Continuous Infusions:   LOS: 1 day    Time spent: 35    Delaine LameShrey C Anfernee Peschke, MD [facility]  If 7PM-7AM, please contact night-coverage [email or web address] Password [email or web address] 12/28/2018, 10:02 AM

## 2018-12-29 ENCOUNTER — Inpatient Hospital Stay (HOSPITAL_COMMUNITY): Payer: Medicare Other

## 2018-12-29 LAB — CBC
HCT: 31.9 % — ABNORMAL LOW (ref 36.0–46.0)
Hemoglobin: 9.8 g/dL — ABNORMAL LOW (ref 12.0–15.0)
MCH: 29 pg (ref 26.0–34.0)
MCHC: 30.7 g/dL (ref 30.0–36.0)
MCV: 94.4 fL (ref 80.0–100.0)
Platelets: 205 10*3/uL (ref 150–400)
RBC: 3.38 MIL/uL — ABNORMAL LOW (ref 3.87–5.11)
RDW: 12.7 % (ref 11.5–15.5)
WBC: 13.3 10*3/uL — ABNORMAL HIGH (ref 4.0–10.5)
nRBC: 0 % (ref 0.0–0.2)

## 2018-12-29 LAB — BASIC METABOLIC PANEL
Anion gap: 10 (ref 5–15)
BUN: 30 mg/dL — ABNORMAL HIGH (ref 8–23)
CO2: 44 mmol/L — ABNORMAL HIGH (ref 22–32)
Calcium: 9.3 mg/dL (ref 8.9–10.3)
Chloride: 69 mmol/L — ABNORMAL LOW (ref 98–111)
Creatinine, Ser: 0.96 mg/dL (ref 0.44–1.00)
GFR calc Af Amer: 60 mL/min — ABNORMAL LOW (ref >60)
Glucose, Bld: 133 mg/dL — ABNORMAL HIGH (ref 70–99)
Sodium: 123 mmol/L — ABNORMAL LOW (ref 135–145)

## 2018-12-29 LAB — BLOOD GAS, ARTERIAL
Acid-Base Excess: 20.9 mmol/L — ABNORMAL HIGH (ref 0.0–2.0)
Bicarbonate: 50.3 mmol/L — ABNORMAL HIGH (ref 20.0–28.0)
O2 Content: 2 L/min
O2 Saturation: 93.7 %
Patient temperature: 98.6
pCO2 arterial: 90.7 mmHg (ref 32.0–48.0)
pH, Arterial: 7.363 (ref 7.350–7.450)
pO2, Arterial: 72.9 mmHg — ABNORMAL LOW (ref 83.0–108.0)

## 2018-12-29 LAB — SODIUM
Sodium: 124 mmol/L — ABNORMAL LOW (ref 135–145)
Sodium: 126 mmol/L — ABNORMAL LOW (ref 135–145)
Sodium: 125 mmol/L — ABNORMAL LOW (ref 135–145)

## 2018-12-29 LAB — MRSA PCR SCREENING: MRSA by PCR: NEGATIVE

## 2018-12-29 LAB — GLUCOSE, CAPILLARY: Glucose-Capillary: 174 mg/dL — ABNORMAL HIGH (ref 70–99)

## 2018-12-29 LAB — BASIC METABOLIC PANEL WITH GFR
GFR calc non Af Amer: 51 mL/min — ABNORMAL LOW (ref >60)
Potassium: 3.5 mmol/L (ref 3.5–5.1)

## 2018-12-29 MED ORDER — FAMOTIDINE IN NACL 20-0.9 MG/50ML-% IV SOLN
20.0000 mg | Freq: Every day | INTRAVENOUS | Status: DC
  Administered 2018-12-29: 20 mg via INTRAVENOUS
  Filled 2018-12-29: qty 50

## 2018-12-29 MED ORDER — HYDRALAZINE HCL 20 MG/ML IJ SOLN
5.0000 mg | INTRAMUSCULAR | Status: DC | PRN
  Administered 2018-12-30 – 2019-01-02 (×8): 5 mg via INTRAVENOUS
  Filled 2018-12-29 (×9): qty 1

## 2018-12-29 NOTE — Progress Notes (Signed)
REport called to Victory Dakin, RN on [place].

## 2018-12-29 NOTE — Progress Notes (Signed)
PROGRESS NOTE    Tammy Fernandez  ZOX:[id number] DOB: Apr 08, 1924 DOA: 12/27/2018 PCP: Myles Lipps, MD    Brief Narrative: 85 year old with past medical history relevant for SIADH on Depakote cycling, hypothyroidism, hypertension, hyperlipidemia, COPD on 2 liters chronically, history of CVA, grade 2 diastolic dysfunction and severe pulmonary hypertension by echo on 12/20/2017, admitted with acute metabolic encephalopathy due to hyponatremia and hypertension.   Assessment & Plan:   Principal Problem:   Hyponatremia Active Problems:   GERD (gastroesophageal reflux disease)   History of CVA (cerebrovascular accident)   Anxiety state   Essential hypertension   Hypothyroidism   Chronic diastolic CHF (congestive heart failure) (HCC)   Severe pulmonary arterial systolic hypertension (HCC)   CAD (coronary artery disease)   Hypochloremic alkalosis   Acute metabolic encephalopathy   Hypertensive urgency   Cough   #) Acute metabolic encephalopathy due to hyponatremia/SIADH: Metabolic encephalopathy is resolved.  Patient sodium did not improve on salt tablets and fluid restriction as well as demeclocycline and so discussed with phone with nephrology and they recommended starting tolvaptan and releasing fluid restriction. -Hold demeclocycline 150 mg twice daily -Release fluid restriction - Frequent sodium checks -Discontinue salt tabs  #) Hypothyroidism: Continue levothyroxine 50 mcg daily  #) Uncontrolled hypertension/hyperlipidemia: - Continue lisinopril 5 mg daily - Continue propranolol 60 mg daily -Continue morphine 5 mg daily  #) History of CVA: -PT consult recommends home health PT -Continue aspirin 81 mg  #) COPD on 2 L/pulmonary hypertension severe: Appears to be euvolemic at this time  #) Chronic diastolic grade 2 dysfunction: Euvolemic  #) Pain/psych: -Continue PRN alprazolam  #) GERD: -Continue H2 blocker -Continue PPI  Fluids: Tolerating p.o. Electrolytes:  Per above Nutrition: Heart healthy diet  Prophylaxis: Enoxaparin  Disposition: Pending improved sodium   Full code   Consultants:   None  Procedures:   None  Antimicrobials:   None   Subjective: This morning the patient does not have any complaints other than a cough.  She denies any chest pain, abdominal pain, nausea, vomiting, diarrhea.  Objective: Vitals:   12/29/18 0534 12/29/18 0534 12/29/18 0903 12/29/18 0904  BP: (!) 121/47 (!) 121/47 (!) 138/47 (!) 138/47  Pulse:  75  65  Resp:  17    Temp:  97.7 F (36.5 C)    TempSrc:  Oral    SpO2:  95%  99%  Weight:      Height:        Intake/Output Summary (Last 24 hours) at 12/29/2018 0932 Last data filed at 12/28/2018 1516 Gross per 24 hour  Intake 360 ml  Output 400 ml  Net -40 ml   Filed Weights   12/28/18 0001  Weight: 78.8 kg    Examination:  General exam: Appears calm and comfortable  Respiratory system: Clear to auscultation. Respiratory effort normal. Cardiovascular system: Regular rate and rhythm, no murmurs Gastrointestinal system: Soft, nondistended, no rebound or guarding, plus bowel sounds Central nervous system: Alert and oriented to person, place but not time or situation.  Grossly intact, in all extremities Extremities: 1+ lower extremity edema Skin: No rashes over visible skin Psychiatry: Unable to assess due to underlying dementia    Data Reviewed: I have personally reviewed following labs and imaging studies  CBC: Recent Labs  Lab 12/27/18 1834 12/28/18 0559 12/29/18 0207  WBC 7.7 10.2 13.3*  NEUTROABS 5.2  --   --   HGB 11.2* 10.2* 9.8*  HCT 36.6 33.0* 31.9*  MCV 92.4 93.8 94.4  PLT 238 235 205   Basic Metabolic Panel: Recent Labs  Lab 12/27/18 1834 12/27/18 2346 12/28/18 0559 12/28/18 0757 12/28/18 1339 12/28/18 1903 12/29/18 0207 12/29/18 0755  NA 122* 121* 123* 123* 122* 121* 123* 124*  K 4.0 3.7 3.5  --   --   --  3.5  --   CL <65* <65* 67*  --   --   --   69*  --   CO2 43* 43* 44*  --   --   --  44*  --   GLUCOSE 83 85 87  --   --   --  133*  --   BUN 19 19 22   --   --   --  30*  --   CREATININE 0.70 0.63 0.59  --   --   --  0.96  --   CALCIUM 9.5 9.3 9.1  --   --   --  9.3  --   MG  --  1.2*  --   --   --   --   --   --    GFR: Estimated Creatinine Clearance: 36.4 mL/min (by C-G formula based on SCr of 0.96 mg/dL). Liver Function Tests: No results for input(s): AST, ALT, ALKPHOS, BILITOT, PROT, ALBUMIN in the last 168 hours. No results for input(s): LIPASE, AMYLASE in the last 168 hours. No results for input(s): AMMONIA in the last 168 hours. Coagulation Profile: No results for input(s): INR, PROTIME in the last 168 hours. Cardiac Enzymes: No results for input(s): CKTOTAL, CKMB, CKMBINDEX, TROPONINI in the last 168 hours. BNP (last 3 results) No results for input(s): PROBNP in the last 8760 hours. HbA1C: No results for input(s): HGBA1C in the last 72 hours. CBG: No results for input(s): GLUCAP in the last 168 hours. Lipid Profile: No results for input(s): CHOL, HDL, LDLCALC, TRIG, CHOLHDL, LDLDIRECT in the last 72 hours. Thyroid Function Tests: Recent Labs    12/28/18 0559  TSH 0.647   Anemia Panel: No results for input(s): VITAMINB12, FOLATE, FERRITIN, TIBC, IRON, RETICCTPCT in the last 72 hours. Sepsis Labs: No results for input(s): PROCALCITON, LATICACIDVEN in the last 168 hours.  No results found for this or any previous visit (from the past 240 hour(s)).       Radiology Studies: Dg Chest 2 View  Result Date: 12/27/2018 CLINICAL DATA:  85 year old female with a history of feet swelling and cough EXAM: CHEST - 2 VIEW COMPARISON:  Multiple prior chest x-ray most recent 06/10/2018, CT 12/21/2017 FINDINGS: Cardiomediastinal silhouette unchanged. Similar appearance of significant elevation of the right hemidiaphragm which obscures the right heart border. Patchy airspace opacity in the right suprahilar region along the  midline. No pneumothorax.  No large pleural effusion. Degenerative changes of the shoulders. Degenerative changes of the spine IMPRESSION: Patchy airspace in the right suprahilar region concerning for pneumonia versus chronic atelectasis/scarring. Unchanged right hemidiaphragm chronic elevation with obscuration the right heart border. Electronically Signed   By: Gilmer MorJaime  Wagner D.O.   On: 12/27/2018 14:23   Ct Chest Wo Contrast  Result Date: 12/27/2018 CLINICAL DATA:  Cough for 2 weeks EXAM: CT CHEST WITHOUT CONTRAST TECHNIQUE: Multidetector CT imaging of the chest was performed following the standard protocol without IV contrast. COMPARISON:  12/21/2017 and chest radiograph from 12/27/2018 FINDINGS: Cardiovascular: Atherosclerotic calcification thoracic aorta and branch vessels. Mediastinum/Nodes: Unremarkable Lungs/Pleura: Elevated right hemidiaphragm. Scarring posteriorly in the right upper lobe. Atelectasis of much of the right lower lobe attributable to the elevated right  hemidiaphragm and similar to the 12/21/2017 exam. No pleural effusion. In the right suprahilar region there is some confluent vasculature and a calcified granuloma, but no airspace opacity is identified; the appearance on radiography is thought to be due to superimposed vascular shadows. Upper Abdomen: Abdominal aortic atherosclerosis. Musculoskeletal: Thoracic spondylosis. IMPRESSION: 1. The region of concern in the right suprahilar area on prior chest radiography is due to superimposed vessels. There is no worrisome airspace opacity in this region. 2. Elevated right hemidiaphragm with chronic atelectasis of the right lower lobe. 3.  Aortic Atherosclerosis (ICD10-I70.0). Electronically Signed   By: Gaylyn Rong M.D.   On: 12/27/2018 16:44        Scheduled Meds: . amLODipine  10 mg Oral Daily  . aspirin EC  81 mg Oral Daily  . enoxaparin (LOVENOX) injection  40 mg Subcutaneous Daily  . famotidine  20 mg Oral QHS  .  fluticasone  1 spray Each Nare BID  . hydrALAZINE  50 mg Oral Q8H  . levothyroxine  50 mcg Oral QAC breakfast  . lisinopril  5 mg Oral Daily  . loratadine  10 mg Oral Daily  . pantoprazole  40 mg Oral Daily  . propranolol ER  60 mg Oral Daily  . tolvaptan  15 mg Oral Q24H   Continuous Infusions:   LOS: 2 days    Time spent: 35    Delaine Lame, MD [facility]  If 7PM-7AM, please contact night-coverage [email or web address] Password [facility] 12/29/2018, 9:32 AM

## 2018-12-29 NOTE — Progress Notes (Signed)
CRITICAL VALUE ALERT  Critical Value:  ABG Ph 7.36, CO2 90.7, BiCarb 50.3, PO2 72.9  Date & Time Notied:  12/29/18 1720  Provider Notified: Dr. Leafy Half   Orders Received/Actions taken: Cont pulse ox ordered

## 2018-12-29 NOTE — Progress Notes (Signed)
Physical Therapy Treatment Patient Details Name: Tammy Fernandez MRN: [id number] DOB: 1924/07/29 Today's Date: 12/29/2018    History of Present Illness 85 year old with past medical history relevant for SIADH on Depakote cycling, hypothyroidism, hypertension, hyperlipidemia, COPD on 2 liters chronically, history of CVA, grade 2 diastolic dysfunction and severe pulmonary hypertension by echo on 12/20/2017, admitted with acute metabolic encephalopathy due to hyponatremia and hypertension.    PT Comments    NT requested assistance with scooting pt back in recliner. While in room, pt and family requested that staff assist pt back to bed. Increased time and multimodal cueing required for all tasks. Pt remained on [place] O2 during activity. Will continue to follow and progress activity as tolerated.     Follow Up Recommendations  Home health PT;Supervision/Assistance - 24 hour     Equipment Recommendations  None recommended by PT    Recommendations for Other Services       Precautions / Restrictions Precautions Precautions: Fall Restrictions Weight Bearing Restrictions: No    Mobility  Bed Mobility               General bed mobility comments: NT  Transfers Overall transfer level: Needs assistance Equipment used: Rolling walker (2 wheeled) Transfers: Sit to/from Stand Sit to Stand: Mod assist Stand pivot transfers: Min assist       General transfer comment: assist to rise and stabilize,  incr time needed,  cues for hand placement and overall safety. Stand pivot, recliner to bed, with RW  Ambulation/Gait Ambulation/Gait assistance: Min assist;+2 safety/equipment Gait Distance (Feet): 5 Feet Assistive device: Rolling walker (2 wheeled) Gait Pattern/deviations: Step-through pattern;Trunk flexed     General Gait Details: Pt took a few ambulatory steps forwards then backwards. Then she took several lateral steps towards the [facility]. Increased time. Assist to stabilize pt and  maneuver safely with RW. Remained on [place] O2.    Stairs             Wheelchair Mobility    Modified Rankin (Stroke Patients Only)       Balance Overall balance assessment: Needs assistance         Standing balance support: Bilateral upper extremity supported Standing balance-[name]: Poor                              Cognition Arousal/Alertness: Awake/alert(but drowsy) Behavior During Therapy: WFL for tasks assessed/performed Overall Cognitive Status: Impaired/Different from baseline Area of Impairment: Orientation                 Orientation Level: Disoriented to;Time;Situation           Problem Solving: Slow processing;Decreased initiation;Difficulty sequencing;Requires verbal cues;Requires tactile cues        Exercises      General Comments        Pertinent Vitals/Pain Pain Assessment: No/denies pain    Home Living                      Prior Function            PT Goals (current goals can now be found in the care plan section) Progress towards PT goals: Progressing toward goals    Frequency    Min 3X/week      PT Plan Current plan remains appropriate    Co-evaluation              AM-PAC PT "6 Clicks" Mobility  Outcome Measure  Help needed turning from your back to your side while in a flat bed without using bedrails?: A Lot Help needed moving from lying on your back to sitting on the side of a flat bed without using bedrails?: A Lot Help needed moving to and from a bed to a chair (including a wheelchair)?: A Little Help needed standing up from a chair using your arms (e.g., wheelchair or bedside chair)?: A Lot Help needed to walk in hospital room?: A Lot Help needed climbing 3-5 steps with a railing? : Total 6 Click Score: 12    End of Session Equipment Utilized During Treatment: Oxygen Activity Tolerance: Patient limited by fatigue Patient left: in bed;with call bell/phone within  reach;with nursing/sitter in room(helped NT get pt back to bed. NT still in room when therapist left)   PT Visit Diagnosis: Difficulty in walking, not elsewhere classified (R26.2);Unsteadiness on feet (R26.81);Muscle weakness (generalized) (M62.81)     Time: 1212-1220 PT Time Calculation (min) (ACUTE ONLY): 8 min  Charges:  $Gait Training: 8-22 mins                        Rebeca AlertJannie Yoni Lobos, PT Acute Rehabilitation Services Pager: [phone] Office: [phone]

## 2018-12-29 NOTE — Plan of Care (Signed)
  Problem: Education: Goal: Knowledge of General Education information will improve Description Including pain rating scale, medication(s)/side effects and non-pharmacologic comfort measures Outcome: Progressing   Problem: Health Behavior/Discharge Planning: Goal: Ability to manage health-related needs will improve Outcome: Progressing   Problem: Clinical Measurements: Goal: Ability to maintain clinical measurements within normal limits will improve Outcome: Progressing Note:  Na level remains 123. Pt taking [place], monitoring Na level q 6hr.  Goal: Diagnostic test results will improve Outcome: Progressing Goal: Respiratory complications will improve Outcome: Progressing Goal: Cardiovascular complication will be avoided Outcome: Progressing   Problem: Activity: Goal: Risk for activity intolerance will decrease Outcome: Progressing Note:  Up to chair for bkfst this AM. Encourage activity and ambulation.    Problem: Elimination: Goal: Will not experience complications related to bowel motility Outcome: Progressing Note:  No BM in several days. Mag citrate given this AM.    Problem: Safety: Goal: Ability to remain free from injury will improve Outcome: Progressing

## 2018-12-29 NOTE — Progress Notes (Signed)
This Clinical research associate was called to room by pt son who states pt is "not responding."  Pt lying in bed, eyes open but not tracking and does not respond to verbal stimuli. Pt noted to have coarse upper airway breath sounds. Pt slowly began tracking and mumbling with vigorous sternal rub. CBg =174. VS as charted. O2 sat initially 85% on her baseline 2lnc. Increased to 6lnc to get sat above 92%. CBG =174. CN at bedside.  Dr Clearnce Sorrel made aware and requested he come to bedside. Pt slowly began to follow commands, able to cough and clear some secretions, state name. Dr Clearnce Sorrel arrived at bedside, spoke w/ pt and family and orders entered. Pt now back to 2lnc w/ 02 sat of 100%. Now slightly lethargic but answering simple questions. Will continue to monitor closely.

## 2018-12-29 NOTE — Progress Notes (Signed)
SLP Cancellation Note  Patient Details Name: Tammy Fernandez MRN: [id number] DOB: 09-12-1924   Cancelled treatment:       Reason Eval/Treat Not Completed: Fatigue/lethargy limiting ability to participate. Son present, and reported pt was scheduled for outpatient MBS last week, but had to cancel the appointment. He reports no difficulty with soft solids or liquids, but reports difficulty with bread and meats. RN also reports no overt s/s aspiration, however, given advanced age, possible dementia, and history of GERD per son, will defer BSE and proceed to [facility] tomorrow (Tuesday 12/30/2018) at 0830. MD, RN and family aware.  Also recommend completion of regular barium swallow to evaluate esophageal motility, given son's report of significant GERD for many years.  Celia B. Murvin [facility], CCC-SLP Speech Language Pathologist [phone]  Leigh Aurora 12/29/2018, 4:04 PM

## 2018-12-29 NOTE — Progress Notes (Signed)
Pt returned from CT scan on 2lnc. O2 sat initially 70s with good pleth wave. Pt drowsy but arousable on calling. States name. O2 sat slowly increased to 94% on 6lnc. Pt had weak cough with encouragement to clear congested upper airway secretions but this was difficult due to pt's drowsiness. Pt finally able to clear some secretions and O2 weaned back to 2lnc with sat 96%. Dr Clearnce SorrelPurohit aware of above and states he will enter orders to transfer to ICU overnight for closer monitoring due to hypercarbia, hypoxia, and lethargy. Pt family at bedside and updated. They verbalize understanding.

## 2018-12-30 ENCOUNTER — Inpatient Hospital Stay (HOSPITAL_COMMUNITY): Payer: Medicare Other

## 2018-12-30 ENCOUNTER — Ambulatory Visit: Payer: Medicare Other | Admitting: Emergency Medicine

## 2018-12-30 DIAGNOSIS — G459 Transient cerebral ischemic attack, unspecified: Secondary | ICD-10-CM

## 2018-12-30 DIAGNOSIS — K228 Other specified diseases of esophagus: Secondary | ICD-10-CM

## 2018-12-30 LAB — CBC
HCT: 33 % — ABNORMAL LOW (ref 36.0–46.0)
Hemoglobin: 9.9 g/dL — ABNORMAL LOW (ref 12.0–15.0)
MCH: 28.5 pg (ref 26.0–34.0)
MCHC: 30 g/dL (ref 30.0–36.0)
MCV: 95.1 fL (ref 80.0–100.0)
Platelets: 228 10*3/uL (ref 150–400)
RBC: 3.47 MIL/uL — ABNORMAL LOW (ref 3.87–5.11)
RDW: 12.8 % (ref 11.5–15.5)
WBC: 11.6 K/uL — ABNORMAL HIGH (ref 4.0–10.5)
nRBC: 0 % (ref 0.0–0.2)

## 2018-12-30 LAB — SODIUM: Sodium: 126 mmol/L — ABNORMAL LOW (ref 135–145)

## 2018-12-30 LAB — BASIC METABOLIC PANEL
Anion gap: 11 (ref 5–15)
BUN: 31 mg/dL — ABNORMAL HIGH (ref 8–23)
CO2: 47 mmol/L — ABNORMAL HIGH (ref 22–32)
Calcium: 9.9 mg/dL (ref 8.9–10.3)
Chloride: 70 mmol/L — ABNORMAL LOW (ref 98–111)
Creatinine, Ser: 0.87 mg/dL (ref 0.44–1.00)
GFR calc Af Amer: >60 mL/min (ref >60)
GFR calc non Af Amer: 58 mL/min — ABNORMAL LOW (ref >60)
Glucose, Bld: 130 mg/dL — ABNORMAL HIGH (ref 70–99)
Potassium: 4.1 mmol/L (ref 3.5–5.1)
Sodium: 128 mmol/L — ABNORMAL LOW (ref 135–145)

## 2018-12-30 MED ORDER — FAMOTIDINE 20 MG PO TABS
20.0000 mg | ORAL_TABLET | Freq: Every day | ORAL | Status: DC
  Administered 2018-12-30 – 2019-01-07 (×6): 20 mg via ORAL
  Filled 2018-12-30 (×7): qty 1

## 2018-12-30 MED ORDER — ORAL CARE MOUTH RINSE
15.0000 mL | Freq: Two times a day (BID) | OROMUCOSAL | Status: DC
  Administered 2018-12-30 – 2019-01-08 (×12): 15 mL via OROMUCOSAL

## 2018-12-30 NOTE — Progress Notes (Signed)
Modified Barium Swallow Progress Note  Patient Details  Name: Tammy Fernandez MRN: [id number] Date of Birth: August 30, 1924  Today's Date: 12/30/2018  Modified Barium Swallow completed.  Full report located under Chart Review in the Imaging Section.  Brief recommendations include the following:  Clinical Impression  Suboptimal view secondary to pt's shoulder elevated and blocking view at times.  Pt presents with mild oral dysphagia and overall functional pharyngeal swallow.  Decreased lingual strength/coordination results in decreased oral manipulation/cohesion and lingual pumping with premature spillage of boluses into pharynx.  Pharyngeal swallow largely intact with only mild laryngeal penetration.  Compensation strategies not tested due to pt's decreased ability to follow directions.  Pt did not aspirate despite being challenged to sequential boluses of thin.  Trace laryngeal penetration did not clear with cued cough/throat clear.  Pt appeared with secretions retained in pharynx.  Oral secretions removed by this SlP prior to barium administration.  Dependent on results of esophagram, recommend dys3/ground meats/extra gravy/sauces and thin liquids.  Medications with puree - whole.  SLP will follow up for family/pt education.  Suspect primary source of pt's dysphagia at this time is her mentation.     Swallow Evaluation Recommendations   Recommended Consults: Other (Comment)(esophagram pending)   SLP Diet Recommendations: Dysphagia 3 (Mech soft) solids;Thin liquid   Liquid Administration via: Straw   Medication Administration: Whole meds with puree   Supervision: Staff to assist with self feeding   Compensations: Slow rate;Small sips/bites;Other (Comment)(oral care prior to all po)Intermittent throat clear/cough, sit fully upright for po and 30 minutes after       Oral Care Recommendations: Oral care before and after PO   Other Recommendations: Have oral suction available   Donavan Burnet, MS [facility] SLP Acute Rehab Services Pager [phone] Office [phone]  [name] 12/30/2018,9:35 AM

## 2018-12-30 NOTE — Progress Notes (Signed)
PROGRESS NOTE    Tammy [name]  MWU:[id number] DOB: Apr 22, 1924 DOA: 12/27/2018 PCP: Myles LippsSantiago, Irma M, MD    Brief Narrative: 85 year old with past medical history relevant for SIADH on Depakote cycling, hypothyroidism, hypertension, hyperlipidemia, COPD on 2 liters chronically, history of CVA, grade 2 diastolic dysfunction and severe pulmonary hypertension by echo on 12/20/2017, admitted with acute metabolic encephalopathy due to hyponatremia and hypertension.   Assessment & Plan:   Principal Problem:   Hyponatremia Active Problems:   GERD (gastroesophageal reflux disease)   History of CVA (cerebrovascular accident)   Anxiety state   Essential hypertension   Hypothyroidism   Chronic diastolic CHF (congestive heart failure) (HCC)   Severe pulmonary arterial systolic hypertension (HCC)   CAD (coronary artery disease)   Hypochloremic alkalosis   Acute metabolic encephalopathy   Hypertensive urgency   Cough  #) Acute hypoxic respiratory failure/ COPD on 2 L/pulmonary hypertension severe: Yesterday patient had episode of nonresponsiveness and hypoxia and was increased to 6 L nasal cannula.  Patient quickly came around.  Stat ABG showed hypercarbia but normal pH the low end of normal.  CT of the head showed no acute process.  Patient quickly resolved however again became hypoxic and required 6 L nasal cannula.  She was then transferred to the ICU for closer monitoring.  Unclear etiology though the rapidity of the symptoms suggest that she is either becoming hypercarbic and subsequently hypoxic as she is hypoventilating or that she is shunting due to her severe pulmonary hypertension..  She is not quite at her baseline in terms of mentation but is awake alert and talking.  Possibly related to aspiration event. - Pending speech evaluation which was scheduled for an outpatient, modified barium swallow  #) Acute on chronic hyponatremia/SIADH: Sodium is improving slowly and  appropriately -Continue tolvaptan 15 mg daily, will likely complete course today and restart fluid restriction, salt tabs, demeclocycline -No fluid restriction or salt tabs while on tolvaptan -Hold demeclocycline 150 mg twice daily  #) Hypothyroidism: -Continue levothyroxine 50 mcg daily  #) Uncontrolled hypertension/hyperlipidemia: - Continue lisinopril 5 mg daily - Continue propranolol 60 mg daily -Continue morphine 5 mg daily  #) History of CVA: -PT consult recommends home health PT -Continue aspirin 81 mg  #) Chronic diastolic grade 2 dysfunction: Euvolemic  #) Pain/psych: -Continue PRN alprazolam  #) GERD: -Continue H2 blocker -Continue PPI  Fluids: Tolerating p.o. Electrolytes: Per above Nutrition: Heart healthy diet  Prophylaxis: Enoxaparin  Disposition: Pending improved sodium and swallow study as well as etiology of underlying acute on chronic hypoxia  Full code   Consultants:   None  Procedures:   None  Antimicrobials:   None   Subjective: This morning the patient does not have any complaints.  She appears to be alert and oriented somewhat but relatively unchanged from baseline.  On discussion with the family member in the room who is not her son she is reported to be not quite altered but not quite to her baseline.  She is pending a swallow eval today.  Objective: Vitals:   12/30/18 0400 12/30/18 0500 12/30/18 0600 12/30/18 0800  BP: (!) 146/45 (!) 140/45 (!) 109/48 (!) 143/39  Pulse: (!) 59 (!) 59 68 68  Resp: 15 15 19 20   Temp:      TempSrc:      SpO2: 100% 98% 100% 100%  Weight:      Height:        Intake/Output Summary (Last 24 hours) at 12/30/2018 0839 Last  data filed at 12/30/2018 [id number] Gross per 24 hour  Intake -  Output 1050 ml  Net -1050 ml   Filed Weights   12/28/18 0001  Weight: 78.8 kg    Examination:  General exam: Appears calm and comfortable  Respiratory system: No increased work of breathing, diminished lung  sounds at bases, scattered rhonchi Cardiovascular system: Bradycardic, regular rhythm, no murmurs Gastrointestinal system: Soft, nondistended, no rebound or guarding, plus bowel sounds Central nervous system: Alert and oriented to person, place but not time or situation.  Grossly intact, in all extremities Extremities: 1+ lower extremity edema Skin: No rashes over visible skin Psychiatry: Unable to assess due to underlying dementia    Data Reviewed: I have personally reviewed following labs and imaging studies  CBC: Recent Labs  Lab 12/27/18 1834 12/28/18 0559 12/29/18 0207 12/30/18 0548  WBC 7.7 10.2 13.3* 11.6*  NEUTROABS 5.2  --   --   --   HGB 11.2* 10.2* 9.8* 9.9*  HCT 36.6 33.0* 31.9* 33.0*  MCV 92.4 93.8 94.4 95.1  PLT 238 235 205 228   Basic Metabolic Panel: Recent Labs  Lab 12/27/18 1834 12/27/18 2346 12/28/18 0559  12/29/18 0207 12/29/18 0755 12/29/18 1322 12/29/18 1936 12/30/18 0116 12/30/18 0548  NA 122* 121* 123*   < > 123* 124* 126* 125* 126* 128*  K 4.0 3.7 3.5  --  3.5  --   --   --   --  4.1  CL <65* <65* 67*  --  69*  --   --   --   --  70*  CO2 43* 43* 44*  --  44*  --   --   --   --  47*  GLUCOSE 83 85 87  --  133*  --   --   --   --  130*  BUN 19 19 22   --  30*  --   --   --   --  31*  CREATININE 0.70 0.63 0.59  --  0.96  --   --   --   --  0.87  CALCIUM 9.5 9.3 9.1  --  9.3  --   --   --   --  9.9  MG  --  1.2*  --   --   --   --   --   --   --   --    < > = values in this interval not displayed.   GFR: Estimated Creatinine Clearance: 40.1 mL/min (by C-G formula based on SCr of 0.87 mg/dL). Liver Function Tests: No results for input(s): AST, ALT, ALKPHOS, BILITOT, PROT, ALBUMIN in the last 168 hours. No results for input(s): LIPASE, AMYLASE in the last 168 hours. No results for input(s): AMMONIA in the last 168 hours. Coagulation Profile: No results for input(s): INR, PROTIME in the last 168 hours. Cardiac Enzymes: No results for  input(s): CKTOTAL, CKMB, CKMBINDEX, TROPONINI in the last 168 hours. BNP (last 3 results) No results for input(s): PROBNP in the last 8760 hours. HbA1C: No results for input(s): HGBA1C in the last 72 hours. CBG: Recent Labs  Lab 12/29/18 1623  GLUCAP 174*   Lipid Profile: No results for input(s): CHOL, HDL, LDLCALC, TRIG, CHOLHDL, LDLDIRECT in the last 72 hours. Thyroid Function Tests: Recent Labs    12/28/18 0559  TSH 0.647   Anemia Panel: No results for input(s): VITAMINB12, FOLATE, FERRITIN, TIBC, IRON, RETICCTPCT in the last 72 hours. Sepsis Labs: No results for  input(s): PROCALCITON, LATICACIDVEN in the last 168 hours.  Recent Results (from the past 240 hour(s))  MRSA PCR Screening     Status: None   Collection Time: 12/29/18  7:34 PM  Result Value Ref Range Status   MRSA by PCR NEGATIVE NEGATIVE Final    Comment:        The GeneXpert MRSA Assay (FDA approved for NASAL specimens only), is one component of a comprehensive MRSA colonization surveillance program. It is not intended to diagnose MRSA infection nor to guide or monitor treatment for MRSA infections. Performed at [facility], [address], [place], [place] [address]          Radiology Studies: Ct Head Wo Contrast  Result Date: 12/29/2018 CLINICAL DATA:  Encephalopathy. EXAM: CT HEAD WITHOUT CONTRAST TECHNIQUE: Contiguous axial images were obtained from the base of the skull through the vertex without intravenous contrast. COMPARISON:  12/20/2017 FINDINGS: Brain: There is no evidence for acute hemorrhage, hydrocephalus, mass lesion, or abnormal extra-axial fluid collection. No definite CT evidence for acute infarction. Diffuse loss of parenchymal volume is consistent with atrophy. Patchy low attenuation in the deep hemispheric and periventricular white matter is nonspecific, but likely reflects chronic microvascular ischemic demyelination. Vascular: No hyperdense vessel or  unexpected calcification. Skull: Normal. Negative for fracture or focal lesion. Sinuses/Orbits: No acute finding. Other: None. IMPRESSION: 1. No acute intracranial abnormality. 2. Atrophy with chronic small vessel white matter ischemic disease. Electronically Signed   By: Kennith Center M.D.   On: 12/29/2018 18:08   Dg Chest Port 1 View  Result Date: 12/29/2018 CLINICAL DATA:  Hypoxic episode. EXAM: PORTABLE CHEST 1 VIEW COMPARISON:  Chest x-ray and CT chest dated December 27, 2017. FINDINGS: Stable cardiomediastinal silhouette. Normal pulmonary vascularity. Atherosclerotic calcification of the aortic arch. Low lung volumes with unchanged elevation of the right hemidiaphragm. Chronic right lower lobe atelectasis. Mild left lower lobe atelectasis. No consolidation, pneumothorax, or large pleural effusion. No acute osseous abnormality. IMPRESSION: No active disease. Electronically Signed   By: Obie Dredge M.D.   On: 12/29/2018 17:34        Scheduled Meds: . amLODipine  10 mg Oral Daily  . aspirin EC  81 mg Oral Daily  . enoxaparin (LOVENOX) injection  40 mg Subcutaneous Daily  . fluticasone  1 spray Each Nare BID  . levothyroxine  50 mcg Oral QAC breakfast  . lisinopril  5 mg Oral Daily  . loratadine  10 mg Oral Daily  . pantoprazole  40 mg Oral Daily  . propranolol ER  60 mg Oral Daily  . tolvaptan  15 mg Oral Q24H   Continuous Infusions: . famotidine (PEPCID) IV Stopped (12/29/18 2216)     LOS: 3 days    Time spent: 35    Delaine Lame, MD [facility]  If 7PM-7AM, please contact night-coverage [email or web address] Password [facility] 12/30/2018, 8:39 AM

## 2018-12-30 NOTE — Progress Notes (Signed)
Carotid artery duplex has been completed. Preliminary results can be found in CV Proc through chart review.   12/30/18 12:24 PM Olen Cordial RVT

## 2018-12-30 NOTE — Consult Note (Signed)
Referring Provider: [facility]  Primary Care Physician:  Myles Lipps, MD Primary Gastroenterologist:  unassigned     Reason for Consultation:   Dysphagia and abnormal barium swallow    ASSESSMENT / PLAN:     1. 85 yo female with cough and solid food dysphagia x 4 months. MBSS today shows mild oral dysphagia, overall functional pharyngeal swallow. Esophagram, also done today, suggests presbyesophagus but GEJ couldn't be evaluated as contrast would not traverse the area despite ingestion of water, position changes and other maneuvers. Weight down about 9 pounds since early Nov 2019.  -esophagram findings likely represent presbyesophagus but cannot exclude stricture / mass based on study findings. Patient is at increased risk for procedures but spoke with son and other family members and they would like to pursue EGD with possible dilation when patient medically optimized.  -? Oral candida. She has white material on tongue which may from contrast this am but family says she has this at home as well (she did recently have antibiotics). Will check again tomorrow and if white material still present then treat empirically for candida    2. Hypochloremic alkalosis. He of recurrent hyponatremia, likely SIADH. On fluid restriction. -Na+ up from 121 to 126 now. Chloride improving, up to 70.   3. Longstanding GERD, on protonix and H2 blocker at home.     HPI:      HPI: Tammy Fernandez is a 85 y.o. female with multiple medical problems not limited to [facility], severe pulmonary HTN, CVA, HTN, CAD, PFO, chronic respiratory failure on home 02, and recurrent hyponatremia/ SIADH Brought to ED on 1/25 for LE swelling, cough and problems swallowing. Cough improving following a course of Augmentin a few weeks ago.   Patient came to ED for LE swelling. Reported cough and swallowing problems. Patient lives with son and per him she sometimes coughs and feels like certain foods get stuck in chest. Mainly  meats and breads are problematic, no problems with liquids. Son says she tolerates  soft / pureed foods the best.  Patient confused, can't really provide history but per son there hasn't been any complaints of painful swallowing. She has a longstanding history of GERD, on PPI and H2 blocker. No complaints of abdominal pain. Bowels generally move fine. No other GI complaints    Data reviewed:  ED BP 245/93 Na 122, Cl < 65 Hgb 11.2  Past Medical History:  Diagnosis Date  . Anxiety   . Chronic lower back pain   . Constipation    h/o fecal disimpaction on 2017  . CVA (cerebral vascular accident) (HCC) 2015   neg workup, MRI small acute lacunar infarcts. ASA only  . Depression   . Diastolic CHF, chronic (HCC)    grade I, most recent echo Jan 2019  . Fatty liver   . GERD (gastroesophageal reflux disease)   . Hyperlipidemia   . Hypertension   . Hyponatremia    multiple episodes with encephalopathy, renal thinks SIADH  . Hypothyroidism, unspecified   . Internal hemorrhoid   . NSTEMI (non-ST elevated myocardial infarction) (HCC) 08/28/2016  . Palpitations   . PFO with atrial septal aneurysm    TTE 2015  . Pneumonia 2016  . Severe pulmonary arterial systolic hypertension (HCC) 12/25/2017   per echo, on 1L oxgen via [place]    Past Surgical History:  Procedure Laterality Date  . ABDOMINAL HYSTERECTOMY    . CATARACT EXTRACTION W/ INTRAOCULAR LENS  IMPLANT, BILATERAL Bilateral   .  CESAREAN SECTION    . TEE WITHOUT CARDIOVERSION N/A 01/19/2014   Procedure: TRANSESOPHAGEAL ECHOCARDIOGRAM (TEE);  Surgeon: Laurey Morale, MD;  Location: [facility] ENDOSCOPY;  Service: Cardiovascular;  Laterality: N/A;  [name]  . VAGINAL HYSTERECTOMY      Prior to Admission medications   Medication Sig Start Date End Date Taking? Authorizing Provider  albuterol (PROVENTIL) (2.5 MG/3ML) 0.083% nebulizer solution Take 3 mLs (2.5 mg total) by nebulization every 4 (four) hours as needed for wheezing or shortness of  breath. 10/08/18  Yes Icard, Rachel Bo, DO  ALPRAZolam (XANAX) 1 MG tablet TAKE 1 TABLET BY MOUTH TWICE DAILY Patient taking differently: Take 0.25 mg by mouth 3 (three) times daily.  10/02/18  Yes Myles Lipps, MD  Aspirin 81 MG EC tablet Take 81 mg by mouth daily.   Yes [provider]  cetirizine (ZYRTEC) 10 MG tablet Take 10 mg by mouth daily.   Yes [provider]  demeclocycline (DECLOMYCIN) 300 MG tablet TK 1/2 T PO BID 12/04/18  Yes Myles Lipps, MD  docusate sodium (COLACE) 100 MG capsule Take 100 mg by mouth daily as needed for mild constipation.   Yes [provider]  fluticasone (FLONASE) 50 MCG/ACT nasal spray Place 1 spray into both nostrils 2 (two) times daily. 11/13/18  Yes Myles Lipps, MD  Glycerin, Laxative, (ADULT SUPPOSITORY RE) Place 1 Dose rectally as needed (costipation).   Yes [provider]  levothyroxine (SYNTHROID, LEVOTHROID) 50 MCG tablet Take 1 tablet (50 mcg total) by mouth daily before breakfast. 09/16/18  Yes Myles Lipps, MD  lisinopril (PRINIVIL,ZESTRIL) 5 MG tablet Take 2 tablets (10 mg total) by mouth daily. Patient taking differently: Take 5 mg by mouth daily.  06/25/18  Yes Mesner, Barbara Cower, MD  meclizine (ANTIVERT) 12.5 MG tablet Take 1 tablet by mouth as needed for dizziness 12/20/18  Yes Stallings, Zoe A, MD  olopatadine (PATANOL) 0.1 % ophthalmic solution Place 1 drop into both eyes daily as needed for allergies. 03/25/18  Yes Myles Lipps, MD  omeprazole (PRILOSEC) 20 MG capsule Take 20 mg by mouth every evening.  12/09/18  Yes [provider]  propranolol ER (INDERAL LA) 60 MG 24 hr capsule TAKE 1 CAPSULE BY MOUTH DAILY 11/13/18  Yes Myles Lipps, MD  ranitidine (ZANTAC) 300 MG tablet Take 300 mg by mouth at bedtime.  10/12/18  Yes [provider]  amLODipine (NORVASC) 5 MG tablet Take 1 tablet (5 mg total) by mouth daily. Patient not taking: Reported on 12/20/2018 07/08/18   Myles Lipps, MD  azelastine (ASTELIN) 0.1 % nasal spray Place 1 spray into both nostrils 2 (two) times daily. Use in each nostril as directed Patient not taking: Reported on 12/27/2018 05/29/18   Myles Lipps, MD  famotidine (PEPCID) 20 MG tablet Take 1 tablet (20 mg total) by mouth daily. Take 1 tablet at Bedtime daily. Patient not taking: Reported on 12/20/2018 12/16/18   Myles Lipps, MD  fluconazole (DIFLUCAN) 150 MG tablet Take 1 tablet (150 mg total) by mouth every 3 (three) days. Patient not taking: Reported on 12/27/2018 12/20/18   Doristine Bosworth, MD  pantoprazole (PROTONIX) 40 MG tablet Take 1 tablet (40 mg total) by mouth daily. Patient not taking: Reported on 12/27/2018 12/04/18   Myles Lipps, MD    Current Facility-Administered Medications  Medication Dose Route Frequency Provider Last Rate Last Dose  . acetaminophen (TYLENOL) tablet 650 mg  650 mg  Oral Q6H PRN Lorretta HarpNiu, Xilin, MD       Or  . acetaminophen (TYLENOL) suppository 650 mg  650 mg Rectal Q6H PRN Lorretta HarpNiu, Xilin, MD      . albuterol (PROVENTIL) (2.5 MG/3ML) 0.083% nebulizer solution 2.5 mg  2.5 mg Nebulization Q4H PRN Lorretta HarpNiu, Xilin, MD      . ALPRAZolam Prudy Feeler(XANAX) tablet 0.25 mg  0.25 mg Oral BID PRN Lorretta HarpNiu, Xilin, MD   0.25 mg at 12/28/18 2319  . alum & mag hydroxide-simeth (MAALOX/MYLANTA) 200-200-20 MG/5ML suspension 15 mL  15 mL Oral Q4H PRN Purohit, Shrey C, MD   15 mL at 12/28/18 1730  . amLODipine (NORVASC) tablet 10 mg  10 mg Oral Daily Lorretta HarpNiu, Xilin, MD   10 mg at 12/29/18 0904  . aspirin EC tablet 81 mg  81 mg Oral Daily Lorretta HarpNiu, Xilin, MD   81 mg at 12/29/18 [id number]  . dextromethorphan-guaiFENesin (MUCINEX DM) 30-600 MG per 12 hr tablet 1 tablet  1 tablet Oral BID PRN Lorretta HarpNiu, Xilin, MD   1 tablet at 12/29/18 0903  . docusate sodium (COLACE) capsule 100 mg  100 mg Oral Daily PRN Purohit, Shrey C, MD   100 mg at 12/28/18 2319  . enoxaparin (LOVENOX) injection 40 mg  40 mg Subcutaneous Daily Lorretta HarpNiu, Xilin, MD   40 mg at 12/29/18 0904  .  famotidine (PEPCID) tablet 20 mg  20 mg Oral QHS Otho BellowsGreen, Terri L, RPH      . fluticasone (FLONASE) 50 MCG/ACT nasal spray 1 spray  1 spray Each Nare BID Lorretta HarpNiu, Xilin, MD   1 spray at 12/29/18 0910  . hydrALAZINE (APRESOLINE) injection 5 mg  5 mg Intravenous Q4H PRN Leda GauzeKirby-Graham, Karen J, NP      . levothyroxine (SYNTHROID, LEVOTHROID) tablet 50 mcg  50 mcg Oral QAC breakfast Lorretta HarpNiu, Xilin, MD   50 mcg at 12/29/18 0534  . lisinopril (PRINIVIL,ZESTRIL) tablet 5 mg  5 mg Oral Daily Purohit, Shrey C, MD   5 mg at 12/29/18 0904  . loratadine (CLARITIN) tablet 10 mg  10 mg Oral Daily Lorretta HarpNiu, Xilin, MD   10 mg at 12/29/18 [id number]  . magnesium citrate solution 1 Bottle  1 Bottle Oral Daily PRN Purohit, Salli QuarryShrey C, MD   1 Bottle at 12/29/18 0903  . meclizine (ANTIVERT) tablet 12.5 mg  12.5 mg Oral TID PRN Lorretta HarpNiu, Xilin, MD      . MEDLINE mouth rinse  15 mL Mouth Rinse BID Purohit, Shrey C, MD      . olopatadine (PATANOL) 0.1 % ophthalmic solution 1 drop  1 drop Both Eyes Daily PRN Lorretta HarpNiu, Xilin, MD      . ondansetron [facility](ZOFRAN) tablet 4 mg  4 mg Oral Q6H PRN Lorretta HarpNiu, Xilin, MD       Or  . ondansetron (ZOFRAN) injection 4 mg  4 mg Intravenous Q6H PRN Lorretta HarpNiu, Xilin, MD      . pantoprazole (PROTONIX) EC tablet 40 mg  40 mg Oral Daily Lorretta HarpNiu, Xilin, MD   40 mg at 12/29/18 0904  . propranolol ER (INDERAL LA) 24 hr capsule 60 mg  60 mg Oral Daily Lorretta HarpNiu, Xilin, MD   60 mg at 12/29/18 0904  . senna-docusate (Senokot-S) tablet 1 tablet  1 tablet Oral QHS PRN Purohit, Shrey C, MD      . tolvaptan (SAMSCA) tablet 15 mg  15 mg Oral Q24H Purohit, Shrey C, MD   15 mg at 12/29/18 1733    Allergies as of 12/27/2018 - Review Complete 12/27/2018  Allergen Reaction Noted  . Crestor [rosuvastatin calcium] Other (See Comments) 08/28/2016  . Keflex [cephalexin] Other (See Comments) 08/28/2016  . Peanut butter flavor Hives 01/20/2014  . Sulfa antibiotics Hives 08/28/2016  . Cephalexin Other (See Comments) 06/03/2014  . Sulfa drugs cross reactors Rash  08/03/2011    Family History  Problem Relation Age of Onset  . Stroke Father   . Hypertension Father   . Colon cancer Cousin   . Colon cancer Other        Aunt  . Diabetes Other        aunt and cousins  . Heart disease Other        cousins  . Healthy Daughter   . Healthy Son     Social History   Socioeconomic History  . Marital status: Widowed    Spouse name: Not on file  . Number of children: 7  . Years of education: Not on file  . Highest education level: Not on file  Occupational History  . Not on file  Social Needs  . Financial resource strain: Not on file  . Food insecurity:    Worry: Not on file    Inability: Not on file  . Transportation needs:    Medical: Not on file    Non-medical: Not on file  Tobacco Use  . Smoking status: Never Smoker  . Smokeless tobacco: Former NeurosurgeonUser    Types: Snuff  . Tobacco comment: "used snuff when I was 19"  Substance and Sexual Activity  . Alcohol use: No  . Drug use: No  . Sexual activity: Not Currently  Lifestyle  . Physical activity:    Days per week: Not on file    Minutes per session: Not on file  . Stress: Not on file  Relationships  . Social connections:    Talks on phone: Not on file    Gets together: Not on file    Attends religious service: Not on file    Active member of club or organization: Not on file    Attends meetings of clubs or organizations: Not on file    Relationship status: Not on file  . Intimate partner violence:    Fear of current or ex partner: Not on file    Emotionally abused: Not on file    Physically abused: Not on file    Forced sexual activity: Not on file  Other Topics Concern  . Not on file  Social History Narrative   ** Merged History Encounter **       Patient lives at home with her son          Review of Systems: Recent swelling of feet, cough. All other systems reviewed and negative except where noted in HPI.  Physical Exam: Vital signs in last 24 hours: Temp:   [97.5 F (36.4 C)-98 F (36.7 C)] 98 F (36.7 C) (01/28 0800) Pulse Rate:  [59-68] 68 (01/28 0800) Resp:  [15-23] 20 (01/28 0800) BP: (109-146)/(38-52) 143/39 (01/28 0800) SpO2:  [96 %-100 %] 100 % (01/28 0800) Last BM Date: 12/25/18 General:   Alert, female in NAD Psych:  Pleasant, cooperative.  Eyes:  Pupils equal, sclera clear, no icterus.   Conjunctiva pink. Ears:  Normal auditory acuity. Nose:  No deformity, discharge,  or lesions. Neck:  Supple; no masses Mouth: thick white film on tongue and bottom teeth Lungs: Normal respiratory effort Heart:  Regular rate and rhythm, no lower extremity edema Abdomen:  Soft, non-distended, nontender, BS  active, no palp mass    Rectal:  Deferred  Neurologic:  Alert , pleasantly confused. Skin:  Intact without significant lesions or rashes.   Intake/Output from previous day: 01/27 0701 - 01/28 0700 In: -  Out: 1050 [Urine:1050] Intake/Output this shift: No intake/output data recorded.  Lab Results: Recent Labs    12/28/18 0559 12/29/18 0207 12/30/18 0548  WBC 10.2 13.3* 11.6*  HGB 10.2* 9.8* 9.9*  HCT 33.0* 31.9* 33.0*  PLT 235 205 228   BMET Recent Labs    12/28/18 0559  12/29/18 0207  12/29/18 1936 12/30/18 0116 12/30/18 0548  NA 123*   < > 123*   < > 125* 126* 128*  K 3.5  --  3.5  --   --   --  4.1  CL 67*  --  69*  --   --   --  70*  CO2 44*  --  44*  --   --   --  47*  GLUCOSE 87  --  133*  --   --   --  130*  BUN 22  --  30*  --   --   --  31*  CREATININE 0.59  --  0.96  --   --   --  0.87  CALCIUM 9.1  --  9.3  --   --   --  9.9   < > = values in this interval not displayed.     Studies/Results: Ct Head Wo Contrast  Result Date: 12/29/2018 CLINICAL DATA:  Encephalopathy. EXAM: CT HEAD WITHOUT CONTRAST TECHNIQUE: Contiguous axial images were obtained from the base of the skull through the vertex without intravenous contrast. COMPARISON:  12/20/2017 FINDINGS: Brain: There is no evidence for acute  hemorrhage, hydrocephalus, mass lesion, or abnormal extra-axial fluid collection. No definite CT evidence for acute infarction. Diffuse loss of parenchymal volume is consistent with atrophy. Patchy low attenuation in the deep hemispheric and periventricular white matter is nonspecific, but likely reflects chronic microvascular ischemic demyelination. Vascular: No hyperdense vessel or unexpected calcification. Skull: Normal. Negative for fracture or focal lesion. Sinuses/Orbits: No acute finding. Other: None. IMPRESSION: 1. No acute intracranial abnormality. 2. Atrophy with chronic small vessel white matter ischemic disease. Electronically Signed   By: Kennith Center M.D.   On: 12/29/2018 18:08   Dg Chest Port 1 View  Result Date: 12/29/2018 CLINICAL DATA:  Hypoxic episode. EXAM: PORTABLE CHEST 1 VIEW COMPARISON:  Chest x-ray and CT chest dated December 27, 2017. FINDINGS: Stable cardiomediastinal silhouette. Normal pulmonary vascularity. Atherosclerotic calcification of the aortic arch. Low lung volumes with unchanged elevation of the right hemidiaphragm. Chronic right lower lobe atelectasis. Mild left lower lobe atelectasis. No consolidation, pneumothorax, or large pleural effusion. No acute osseous abnormality. IMPRESSION: No active disease. Electronically Signed   By: Obie Dredge M.D.   On: 12/29/2018 17:34   Dg Swallowing Func-speech Pathology  Result Date: 12/30/2018 Objective Swallowing Evaluation: Type of Study: MBS-Modified Barium Swallow Study  Patient Details Name: Tammy Fernandez MRN: [id number] Date of Birth: 03/22/1924 Today's Date: 12/30/2018 Time: SLP Start Time (ACUTE ONLY): 1006 -SLP Stop Time (ACUTE ONLY): 1027 SLP Time Calculation (min) (ACUTE ONLY): 21 min Past Medical History: Past Medical History: Diagnosis Date . Anxiety  . Chronic lower back pain  . Constipation   h/o fecal disimpaction on 2017 . CVA (cerebral vascular accident) (HCC) 2015  neg workup, MRI small acute lacunar infarcts.  ASA only . Depression  . Diastolic CHF, chronic (HCC)  grade I, most recent echo Jan 2019 . Fatty liver  . GERD (gastroesophageal reflux disease)  . Hyperlipidemia  . Hypertension  . Hyponatremia   multiple episodes with encephalopathy, renal thinks SIADH . Hypothyroidism, unspecified  . Internal hemorrhoid  . NSTEMI (non-ST elevated myocardial infarction) (HCC) 08/28/2016 . Palpitations  . PFO with atrial septal aneurysm   TTE 2015 . Pneumonia 2016 . Severe pulmonary arterial systolic hypertension (HCC) 12/25/2017  per echo, on 1L oxgen via [place] Past Surgical History: Past Surgical History: Procedure Laterality Date . ABDOMINAL HYSTERECTOMY   . CATARACT EXTRACTION W/ INTRAOCULAR LENS  IMPLANT, BILATERAL Bilateral  . CESAREAN SECTION   . TEE WITHOUT CARDIOVERSION N/A 01/19/2014  Procedure: TRANSESOPHAGEAL ECHOCARDIOGRAM (TEE);  Surgeon: Laurey Morale, MD;  Location: [facility] ENDOSCOPY;  Service: Cardiovascular;  Laterality: N/A;  [name] . VAGINAL HYSTERECTOMY   HPI: 85 year old female admitted 12/27/2018 with cough, leg edema and mild confusion. PMH: SIADH on Depakote cycling, hypothyroidism, hypertension, hyperlipidemia, COPD on 2 liters chronically, history of CVA, grade 2 diastolic dysfunction and severe pulmonary hypertension by echo on 12/20/2017.  Pt was scheduled for OP MBS last week but did not come for testing - ? due to illness.  Per notes, pt reports problems with meats and breads.   Subjective: pt awake in flouro chair Assessment / Plan / Recommendation CHL IP CLINICAL IMPRESSIONS 12/30/2018 Clinical Impression Suboptimal view secondary to pt's shoulder elevated and blocking view at times.  Pt presents with mild oral dysphagia and overall functional pharyngeal swallow.  Decreased lingual strength/coordination results in decreased oral manipulation/cohesion and lingual pumping with premature spillage of boluses into pharynx.  Pharyngeal swallow largely intact with only mild laryngeal penetration.  Compensation  strategies not tested due to pt's decreased ability to follow directions.  Pt did not aspirate despite being challenged to sequential boluses of thin.  Trace laryngeal penetration did not clear with cued cough/throat clear.  Pt appeared with secretions retained in pharynx.  Oral secretions removed by this SlP prior to barium administration.  Dependent on results of esophagram, recommend dys3/ground meats/extra gravy/sauces and thin liquids.  Medications with puree - whole.  SLP will follow up for family/pt education.  Suspect primary source of pt's dysphagia at this time is her mentation.   SLP Visit Diagnosis Dysphagia, oral phase (R13.11) Attention and concentration deficit following -- Frontal lobe and executive function deficit following -- Impact on safety and function Moderate aspiration risk   CHL IP TREATMENT RECOMMENDATION 12/30/2018 Treatment Recommendations Therapy as outlined in treatment plan below   Prognosis 12/30/2018 Prognosis for Safe Diet Advancement Fair Barriers to Reach Goals -- Barriers/Prognosis Comment -- CHL IP DIET RECOMMENDATION 12/30/2018 SLP Diet Recommendations Dysphagia 3 (Mech soft) solids;Thin liquid Liquid Administration via -- Medication Administration Whole meds with puree Compensations Slow rate;Small sips/bites;Other (Comment) Postural Changes --   CHL IP OTHER RECOMMENDATIONS 12/30/2018 Recommended Consults Other (Comment) Oral Care Recommendations Oral care before and after PO Other Recommendations Have oral suction available   CHL IP FOLLOW UP RECOMMENDATIONS 12/30/2018 Follow up Recommendations (No Data)   CHL IP FREQUENCY AND DURATION 12/30/2018 Speech Therapy Frequency (ACUTE ONLY) min 2x/week Treatment Duration 1 week      CHL IP ORAL PHASE 12/30/2018 Oral Phase Impaired Oral - Pudding Teaspoon -- Oral - Pudding Cup -- Oral - Honey Teaspoon -- Oral - Honey Cup -- Oral - Nectar Teaspoon -- Oral - Nectar Cup -- Oral - Nectar Straw Lingual pumping;Delayed oral transit;Premature  spillage;Decreased bolus cohesion;Weak lingual  manipulation Oral - Thin Teaspoon Lingual pumping;Delayed oral transit;Premature spillage;Decreased bolus cohesion;Weak lingual manipulation Oral - Thin Cup -- Oral - Thin Straw Lingual pumping;Delayed oral transit;Premature spillage;Decreased bolus cohesion;Weak lingual manipulation Oral - Puree Lingual pumping;Delayed oral transit;Weak lingual manipulation Oral - Mech Soft Lingual pumping;Delayed oral transit;Impaired mastication;Weak lingual manipulation Oral - Regular -- Oral - Multi-Consistency -- Oral - Pill -- Oral Phase - Comment pt benefited from verbal cues at times to swallow  CHL IP PHARYNGEAL PHASE 12/30/2018 Pharyngeal Phase Impaired Pharyngeal- Pudding Teaspoon -- Pharyngeal -- Pharyngeal- Pudding Cup -- Pharyngeal -- Pharyngeal- Honey Teaspoon -- Pharyngeal -- Pharyngeal- Honey Cup -- Pharyngeal -- Pharyngeal- Nectar Teaspoon -- Pharyngeal -- Pharyngeal- Nectar Cup -- Pharyngeal -- Pharyngeal- Nectar Straw WFL Pharyngeal -- Pharyngeal- Thin Teaspoon WFL Pharyngeal -- Pharyngeal- Thin Cup -- Pharyngeal -- Pharyngeal- Thin Straw Penetration/Aspiration during swallow Pharyngeal Material enters airway, remains ABOVE vocal cords and not ejected out Pharyngeal- Puree WFL Pharyngeal -- Pharyngeal- Mechanical Soft WFL Pharyngeal -- Pharyngeal- Regular -- Pharyngeal -- Pharyngeal- Multi-consistency -- Pharyngeal -- Pharyngeal- Pill -- Pharyngeal -- Pharyngeal Comment challenged pt to sequential bolus swallows with thin without aspiration or significant penetration observed  CHL IP CERVICAL ESOPHAGEAL PHASE 12/30/2018 Cervical Esophageal Phase WFL- see esophagram  Pudding Teaspoon -- Pudding Cup -- Honey Teaspoon -- Honey Cup -- Nectar Teaspoon -- Nectar Cup -- Nectar Straw -- Thin Teaspoon -- Thin Cup -- Thin Straw -- Puree -- Mechanical Soft -- Regular -- Multi-consistency -- Pill -- Cervical Esophageal Comment -- Donavan Burnet, MS [facility] SLP Acute Rehab Services  Pager [phone] Office [phone] [name] 12/30/2018, 10:51 AM              Dg Esophagus W Single Cm (sol Or Thin Ba)  Result Date: 12/30/2018 CLINICAL DATA:  85 year old female with dysphagia. Initial encounter. EXAM: ESOPHOGRAM/BARIUM SWALLOW TECHNIQUE: Single contrast examination was performed using  thin barium. FLUOROSCOPY TIME:  Fluoroscopy Time:  1 minutes and 12 seconds. Radiation Exposure Index: 18 mGy COMPARISON:  None. FINDINGS: Residual contrast in stomach and proximal small bowel from speech swallow study performed just prior to this exam. Exam was tailored to the patient. Patient was in a 30 degree upright position and imaging performed from frontal projection. Poor primary esophageal stripping wave. Tertiary wave contractions noted throughout the exam. Contrast would not traverse into the stomach despite dry swallowing, ingestion of small amount of water and placing the patient in 45 degree upright position. Patient will be kept in a 30 degree upright position for the next 4 hours. IMPRESSION: 1. Presbyesophagus with poor primary esophageal stripping wave and tertiary wave contractions. 2. Not able to evaluate the gastroesophageal junction as contrast would not traverse beyond this region despite dry swallowing, ingestion of water, change of patient position and delayed imaging. Electronically Signed   By: Lacy Duverney M.D.   On: 12/30/2018 10:00     Willette Cluster, NP-C @  12/30/2018, 11:49 AM     I

## 2018-12-30 NOTE — Progress Notes (Signed)
  Speech Language Pathology Treatment: Dysphagia  Patient Details Name: Tammy Fernandez MRN: [id number] DOB: Apr 25, 1924 Today's Date: 12/30/2018 Time: 1006-1027 SLP Time Calculation (min) (ACUTE ONLY): 21 min  Assessment / Plan / Recommendation Clinical Impression  Family (son [name]) requested to be educated to findings of MBS.  SLP educated him to primary oral deficits noted resulting in premature spillage.  Advised he speak to MD regarding esophageal findings (severe dysmotility), this SLP suspects pt's primary aspiration risk is esophageal.    Pt's son reports pt coughing more AFTER meals and having more difficulties with solids eg: breads, meats - more dense items.  Advised he avoid excessively dense items with pt to decrease her discomfort.  Son has been chopping some foods at home for pt to ease oral deficits - thus pt with suspected baseline dysphagia.    Son also states pt will consume liquids when lying in bed - advised she sit fully upright due to her oral and suspected esophageal deficits.    SLP questions some underlying cognitive deficits, pt's son states when her sodium levels are off, she presents with memory deficits.  Also note excessively leaning to the left - which son states is baseline.    Will follow up with pt/family for education/pt po tolerance and indication for dietary modifications.       HPI HPI: 85 year old female admitted 12/27/2018 with cough, leg edema and mild confusion. PMH: SIADH on Depakote cycling, hypothyroidism, hypertension, hyperlipidemia, COPD on 2 liters chronically, history of CVA, grade 2 diastolic dysfunction and severe pulmonary hypertension by echo on 12/20/2017.  Pt was scheduled for OP MBS last week but did not come for testing - ? due to illness.  Per notes, pt reports problems with meats and breads.        SLP Plan  Continue with current plan of care       Recommendations  Diet recommendations: Dysphagia 3 (mechanical soft);Thin  liquid Liquids provided via: Straw Medication Administration: Whole meds with puree Supervision: Full supervision/cueing for compensatory strategies Compensations: Slow rate;Small sips/bites;Other (Comment);Lingual sweep for clearance of pocketing(clear throat/cough intermittently, stop po if coughing or dyspneic) Postural Changes and/or Swallow Maneuvers: Seated upright 90 degrees;Upright 30-60 min after meal                Oral Care Recommendations: Oral care before and after PO Follow up Recommendations: (tbd) SLP Visit Diagnosis: Dysphagia, oral phase (R13.11) Plan: Continue with current plan of care       GO                [name] 12/30/2018, 10:37 AM  Donavan Burnet, MS [facility] SLP Acute Rehab Services Pager [phone] Office [phone]

## 2018-12-31 ENCOUNTER — Inpatient Hospital Stay (HOSPITAL_COMMUNITY): Payer: Medicare Other

## 2018-12-31 DIAGNOSIS — R933 Abnormal findings on diagnostic imaging of other parts of digestive tract: Secondary | ICD-10-CM

## 2018-12-31 LAB — BASIC METABOLIC PANEL
Anion gap: 13 (ref 5–15)
Anion gap: 12 (ref 5–15)
Anion gap: 8 (ref 5–15)
Anion gap: 8 (ref 5–15)
BUN: 27 mg/dL — ABNORMAL HIGH (ref 8–23)
BUN: 26 mg/dL — ABNORMAL HIGH (ref 8–23)
BUN: 24 mg/dL — ABNORMAL HIGH (ref 8–23)
BUN: 25 mg/dL — ABNORMAL HIGH (ref 8–23)
CHLORIDE: 70 mmol/L — AB (ref 98–111)
CO2: 47 mmol/L — AB (ref 22–32)
CO2: 50 mmol/L — ABNORMAL HIGH (ref 22–32)
CO2: 48 mmol/L — ABNORMAL HIGH (ref 22–32)
Calcium: 10 mg/dL (ref 8.9–10.3)
Calcium: 10 mg/dL (ref 8.9–10.3)
Calcium: 10.2 mg/dL (ref 8.9–10.3)
Calcium: 9.8 mg/dL (ref 8.9–10.3)
Chloride: 71 mmol/L — ABNORMAL LOW (ref 98–111)
Chloride: 71 mmol/L — ABNORMAL LOW (ref 98–111)
Chloride: 73 mmol/L — ABNORMAL LOW (ref 98–111)
Creatinine, Ser: 0.79 mg/dL (ref 0.44–1.00)
Creatinine, Ser: 0.76 mg/dL (ref 0.44–1.00)
Creatinine, Ser: 0.76 mg/dL (ref 0.44–1.00)
Creatinine, Ser: 0.83 mg/dL (ref 0.44–1.00)
GFR calc Af Amer: >60 mL/min (ref >60)
GFR calc Af Amer: >60 mL/min (ref >60)
GFR calc Af Amer: >60 mL/min (ref >60)
GFR calc Af Amer: >60 mL/min (ref >60)
GFR calc non Af Amer: >60 mL/min (ref >60)
GFR calc non Af Amer: >60 mL/min (ref >60)
GFR calc non Af Amer: >60 mL/min (ref >60)
GFR calc non Af Amer: >60 mL/min (ref >60)
GLUCOSE: 143 mg/dL — AB (ref 70–99)
GLUCOSE: 165 mg/dL — AB (ref 70–99)
Glucose, Bld: 116 mg/dL — ABNORMAL HIGH (ref 70–99)
Glucose, Bld: 147 mg/dL — ABNORMAL HIGH (ref 70–99)
POTASSIUM: 4.3 mmol/L (ref 3.5–5.1)
Potassium: 4.4 mmol/L (ref 3.5–5.1)
Potassium: 4.3 mmol/L (ref 3.5–5.1)
Potassium: 4.3 mmol/L (ref 3.5–5.1)
Sodium: 128 mmol/L — ABNORMAL LOW (ref 135–145)
Sodium: 129 mmol/L — ABNORMAL LOW (ref 135–145)
Sodium: 129 mmol/L — ABNORMAL LOW (ref 135–145)
Sodium: 129 mmol/L — ABNORMAL LOW (ref 135–145)

## 2018-12-31 LAB — CBC
HCT: 35.7 % — ABNORMAL LOW (ref 36.0–46.0)
Hemoglobin: 10.5 g/dL — ABNORMAL LOW (ref 12.0–15.0)
MCH: 28.6 pg (ref 26.0–34.0)
MCHC: 29.4 g/dL — ABNORMAL LOW (ref 30.0–36.0)
MCV: 97.3 fL (ref 80.0–100.0)
Platelets: 233 10*3/uL (ref 150–400)
RBC: 3.67 MIL/uL — ABNORMAL LOW (ref 3.87–5.11)
RDW: 12.8 % (ref 11.5–15.5)
WBC: 9.2 10*3/uL (ref 4.0–10.5)
nRBC: 0 % (ref 0.0–0.2)

## 2018-12-31 LAB — BLOOD GAS, VENOUS
Acid-Base Excess: 25.8 mmol/L — ABNORMAL HIGH (ref 0.0–2.0)
Bicarbonate: 54.8 mmol/L — ABNORMAL HIGH (ref 20.0–28.0)
O2 Saturation: 79.8 %
PO2 VEN: 45.7 mmHg — AB (ref 32.0–45.0)
Patient temperature: 98.6
pCO2, Ven: 82.2 mmHg (ref 44.0–60.0)
pH, Ven: 7.439 — ABNORMAL HIGH (ref 7.250–7.430)

## 2018-12-31 LAB — BASIC METABOLIC PANEL WITH GFR: CO2: 44 mmol/L — ABNORMAL HIGH (ref 22–32)

## 2018-12-31 LAB — GLUCOSE, CAPILLARY: GLUCOSE-CAPILLARY: 141 mg/dL — AB (ref 70–99)

## 2018-12-31 MED ORDER — BOOST / RESOURCE BREEZE PO LIQD CUSTOM
1.0000 | Freq: Three times a day (TID) | ORAL | Status: DC
  Administered 2018-12-31 – 2019-01-08 (×14): 1 via ORAL

## 2018-12-31 MED ORDER — PHENYLEPHRINE HCL-NACL 10-0.9 MG/250ML-% IV SOLN
INTRAVENOUS | Status: AC
  Filled 2018-12-31: qty 250

## 2018-12-31 MED ORDER — PHENYLEPHRINE HCL-NACL 10-0.9 MG/250ML-% IV SOLN: 25.0000 ug/min | INTRAVENOUS | Status: DC

## 2018-12-31 MED ORDER — SODIUM CHLORIDE 0.9 % IV SOLN
INTRAVENOUS | Status: DC
  Administered 2018-12-31 – 2019-01-01 (×3): via INTRAVENOUS

## 2018-12-31 MED ORDER — PIPERACILLIN-TAZOBACTAM IN DEX 2-0.25 GM/50ML IV SOLN: 2.2500 g | Freq: Four times a day (QID) | INTRAVENOUS | Status: DC

## 2018-12-31 MED ORDER — PIPERACILLIN-TAZOBACTAM 3.375 G IVPB
3.3750 g | Freq: Three times a day (TID) | INTRAVENOUS | Status: DC
  Administered 2018-12-31 – 2019-01-03 (×11): 3.375 g via INTRAVENOUS
  Filled 2018-12-31 (×11): qty 50

## 2018-12-31 MED ORDER — SODIUM CHLORIDE 0.9 % IV SOLN
250.0000 mL | INTRAVENOUS | Status: DC
  Administered 2019-01-01: 250 mL via INTRAVENOUS

## 2018-12-31 MED ORDER — SODIUM CHLORIDE 0.9 % IV SOLN: 250.0000 mL | INTRAVENOUS | Status: DC

## 2018-12-31 MED ORDER — PHENYLEPHRINE HCL-NACL 10-0.9 MG/250ML-% IV SOLN
0.0000 ug/min | INTRAVENOUS | Status: DC
  Administered 2018-12-31: 50 ug/min via INTRAVENOUS

## 2018-12-31 MED ORDER — SODIUM CHLORIDE 0.9 % IV BOLUS: 500.0000 mL | Freq: Once | INTRAVENOUS | Status: DC

## 2018-12-31 NOTE — Progress Notes (Signed)
PT Cancellation Note  Patient Details Name: Tammy Fernandez MRN: [id number] DOB: 09-04-1924   Cancelled Treatment:     PT attempted this pm - pt c/o dizziness lying in bed - BP 157/39 - RN aware.  Will follow.  Mauro Kaufmann PT Acute Rehabilitation Services Pager [phone] Office [phone]    [facility] 12/31/2018, 3:44 PM

## 2018-12-31 NOTE — Progress Notes (Signed)
[facility] Progress Note    Since last GI note: She was asleep in ICU bed this morning. She was easy to awaken but seems confused. Clearly does not know where she is or the year. She is in no apparent discomfort. RN tells me she thinks she is overall less confused than yesterday.  She was offered dysphagia 3 diet last night but only wanted to eat a very little bit of apple sauce.  Objective: Vital signs in last 24 hours: Temp:  [97.8 F (36.6 C)-98.2 F (36.8 C)] 98 F (36.7 C) (01/29 0338) Pulse Rate:  [60-78] 76 (01/29 0648) Resp:  [15-29] 23 (01/29 0648) BP: (102-181)/(24-113) 149/36 (01/29 0648) SpO2:  [95 %-100 %] 100 % (01/29 0648) Last BM Date: 12/25/18 General: alert and oriented times 1 Heart: regular rate and rythm Abdomen: soft, non-tender, non-distended, normal bowel sounds   Lab Results: Recent Labs    12/29/18 0207 12/30/18 0548 12/31/18 0233  WBC 13.3* 11.6* 9.2  HGB 9.8* 9.9* 10.5*  PLT 205 228 233  MCV 94.4 95.1 97.3   Recent Labs    12/29/18 0207  12/30/18 0116 12/30/18 0548 12/31/18 0233  NA 123*   < > 126* 128* 128*  K 3.5  --   --  4.1 4.4  CL 69*  --   --  70* 71*  CO2 44*  --   --  47* 44*  GLUCOSE 133*  --   --  130* 116*  BUN 30*  --   --  31* 27*  CREATININE 0.96  --   --  0.87 0.79  CALCIUM 9.3  --   --  9.9 10.0   < > = values in this interval not displayed.  Medications: Scheduled Meds: . amLODipine  10 mg Oral Daily  . aspirin EC  81 mg Oral Daily  . enoxaparin (LOVENOX) injection  40 mg Subcutaneous Daily  . famotidine  20 mg Oral QHS  . fluticasone  1 spray Each Nare BID  . levothyroxine  50 mcg Oral QAC breakfast  . lisinopril  5 mg Oral Daily  . loratadine  10 mg Oral Daily  . mouth rinse  15 mL Mouth Rinse BID  . pantoprazole  40 mg Oral Daily  . propranolol ER  60 mg Oral Daily  . tolvaptan  15 mg Oral Q24H   Continuous Infusions: PRN Meds:.acetaminophen **OR** acetaminophen, albuterol, ALPRAZolam, alum  & mag hydroxide-simeth, dextromethorphan-guaiFENesin, docusate sodium, hydrALAZINE, magnesium citrate, meclizine, olopatadine, ondansetron **OR** ondansetron (ZOFRAN) IV, senna-docusate    Assessment/Plan: 85 y.o. female with dysphagia, abnormal esophagram  She is still quite confused. I am not sure what her baseline mental status is but currently only alert and oriented to self. I think she needs to be more alert, oriented before any further testing or treatment would be recommended for her dysphagia/abnormal esophagram.  Will follow along.    Rachael Fee, MD  12/31/2018, 8:38 AM [facility] Pager [phone]

## 2018-12-31 NOTE — Progress Notes (Signed)
RN took patients rings off because of arm and hand swelling they were put in a cup with a patient label and the sister took them home with her.

## 2018-12-31 NOTE — Progress Notes (Signed)
Went to check in on pt at 10pm- not responsive as usual when trying to wake pt up. Sternal rub performed and pt opened eyes but is totally disoriented. Charge nurse came to assess and is paging night coverage to come as well. Will continue to monitor.

## 2018-12-31 NOTE — Progress Notes (Signed)
PROGRESS NOTE  Tammy Fernandez PJS:[id number] DOB: 1924/08/06 DOA: 12/27/2018 PCP: Myles Lipps, MD   LOS: 4 days   Brief narrative: Patient is a 85 year old with past medical history relevant for SIADH on Demeclocycline, hypothyroidism, hypertension, hyperlipidemia, COPD on 2 liters chronically, history of CVA, grade 2 diastolic dysfunction and severe pulmonary hypertension by echo on 12/20/2017, admitted with acute metabolic encephalopathy due to hyponatremia and hypertension.  Assessment/Plan:  Principal Problem:   Hyponatremia Active Problems:   GERD (gastroesophageal reflux disease)   History of CVA (cerebrovascular accident)   Anxiety state   Essential hypertension   Hypothyroidism   Chronic diastolic CHF (congestive heart failure) (HCC)   Severe pulmonary arterial systolic hypertension (HCC)   CAD (coronary artery disease)   Hypochloremic alkalosis   Acute metabolic encephalopathy   Hypertensive urgency   Cough  # Acute hypoxic respiratory failure/ COPD on 2 L/pulmonary hypertension severe:  Prior to admission, patient was on 2 L oxygen via [place].  Currently she is on 5 L oxygen via nasal cannula.  Last blood gas was on 1/27 which showed hypercapnia to 91 but with normal pH.  Patient remains full code.  Will obtain chest x-ray and blood gas this afternoon.  If patient continues to retain carbon dioxide, we may have to start her BiPAP.  Patient remains full code at this time.    # Dysphagia -speech therapy consult appreciated.  Abnormal barium esophagram.  GI consult was called.  Patient does not qualify for endoscopy at this time.  Currently on dysphagia 3 diet.  # Acute on chronic hyponatremia/SIADH:  Prior to admission, patient was on demeclocycline and salt tablets through her primary care doctor.  Per family, patient does not have a nephrologist as an outpatient.  Patient was given a course of tolvaptan 50 mg daily in the hospital.  Demeclocycline and salt tablets remain on  hold at this time.  Sodium level is improving gradually, 148 today.    #) Hypothyroidism: Continue levothyroxine 50 mcg daily  #) Uncontrolled hypertension/hyperlipidemia: - Continue lisinopril 5 mg daily - Continue propranolol 60 mg daily  #) History of CVA: -PT consult recommends home health PT -Continue aspirin 81 mg  #) Chronic diastolic grade 2 dysfunction: Euvolemic  #) Pain/psych: -Continue PRN alprazolam  #) GERD: Takes PPI in the morning and Pepcid at night.  Nutrition: Heart healthy diet VTE Prophylaxis: Enoxaparin  Disposition: Pending improved sodium and swallow study as well as etiology of underlying acute on chronic hypoxia  Continuous Infusions: none  Scheduled Meds: . amLODipine  10 mg Oral Daily  . aspirin EC  81 mg Oral Daily  . enoxaparin (LOVENOX) injection  40 mg Subcutaneous Daily  . famotidine  20 mg Oral QHS  . fluticasone  1 spray Each Nare BID  . levothyroxine  50 mcg Oral QAC breakfast  . lisinopril  5 mg Oral Daily  . loratadine  10 mg Oral Daily  . mouth rinse  15 mL Mouth Rinse BID  . pantoprazole  40 mg Oral Daily  . propranolol ER  60 mg Oral Daily  . tolvaptan  15 mg Oral Q24H    PRN meds: acetaminophen **OR** acetaminophen, albuterol, ALPRAZolam, alum & mag hydroxide-simeth, dextromethorphan-guaiFENesin, docusate sodium, hydrALAZINE, magnesium citrate, meclizine, olopatadine, ondansetron **OR** ondansetron (ZOFRAN) IV, senna-docusate   Consultants:  none  Procedures:  None  Subjective: Patient was seen and examined this morning.  Pleasant elderly African-American female.  Propped up in bed.  Daughter feeding her.  Patient  is confused, oriented to person, but not to place or time Objective: Vitals:   12/31/18 0851 12/31/18 1000  BP: (!) 161/55 (!) 159/32  Pulse:  82  Resp:  20  Temp:    SpO2:  98%    Intake/Output Summary (Last 24 hours) at 12/31/2018 1203 Last data filed at 12/31/2018 0656 Gross per 24 hour   Intake -  Output 800 ml  Net -800 ml   Filed Weights   12/28/18 0001  Weight: 78.8 kg   Body mass index is 31.77 kg/m.   Physical Exam: GENERAL: Pleasant, elderly, African-American female HENT: No scleral pallor or icterus. Pupils equally reactive to light. Oral mucosa is moist NECK: is supple, no palpable thyroid enlargement. CHEST: Clear to auscultation. No crackles or wheezes. Non tender on palpation. Diminished breath sounds bilaterally. CVS: S1 and S2 heard, no murmur. Regular rate and rhythm. No pericardial rub. ABDOMEN: Soft, non-tender, bowel sounds are present. No palpable hepato-splenomegaly. EXTREMITIES: No edema. CNS: Alert, awake, oriented to person only. SKIN: warm and dry without rashes.  Data Review: I have personally reviewed the laboratory data and studies available.  BMP Latest Ref Rng & Units 12/31/2018 12/30/2018 12/30/2018  Glucose 70 - 99 mg/dL 248(L) 859(M) -  BUN 8 - 23 mg/dL 93(J) 12(T) -  Creatinine 0.44 - 1.00 mg/dL 6.24 4.69 -  BUN/Creat Ratio 12 - 28 - - -  Sodium 135 - 145 mmol/L 128(L) 128(L) 126(L)  Potassium 3.5 - 5.1 mmol/L 4.4 4.1 -  Chloride 98 - 111 mmol/L 71(L) 70(L) -  CO2 22 - 32 mmol/L 44(H) 47(H) -  Calcium 8.9 - 10.3 mg/dL 50.7 9.9 -   CBC Latest Ref Rng & Units 12/31/2018 12/30/2018 12/29/2018  WBC 4.0 - 10.5 K/uL 9.2 11.6(H) 13.3(H)  Hemoglobin 12.0 - 15.0 g/dL 10.5(L) 9.9(L) 9.8(L)  Hematocrit 36.0 - 46.0 % 35.7(L) 33.0(L) 31.9(L)  Platelets 150 - 400 K/uL 233 228 205    Lorin Glass, MD  [facility] 12/31/2018

## 2018-12-31 NOTE — Care Management Note (Signed)
Case Management Note  Patient Details  Name: HEALY FURTAK MRN: [id number] Date of Birth: 1924-11-15  Subjective/Objective:                  Discharge Readiness Return to top of Dehydration RRG - ISC  Discharge readiness is indicated by patient meeting Recovery Milestones, including ALL of the following: ? Hemodynamic stability bp 102/24-181/113 ? Cause of dehydration requiring inpatient treatment absent/NO NA =128 ? Vomiting absent or controlled HAS BEEN DIAGNOSISED WITH  presbyesophagus ? Mental status at baseline NO CONFUSED AS TO TIME, PLACE ? Renal function at baseline or improved  BUN-27/CREAT.'=0.79 ? Electrolyte levels normal or acceptable for outpatient follow-up NO NA 128 ? Ambulatory NO ? Oral hydration  PO LIQUIDS ? LEVEL OF CARE SDU   Action/Plan: Following for progression of care. Following for cm needs none present at this time.  Expected Discharge Date:                  Expected Discharge Plan:     In-House Referral:     Discharge planning Services     Post Acute Care Choice:    Choice offered to:     DME Arranged:    DME Agency:     HH Arranged:    HH Agency:     Status of Service:     If discussed at [employer], dates discussed:    Additional Comments:  Golda Acre, RN 12/31/2018, 9:44 AM

## 2018-12-31 NOTE — Consult Note (Signed)
Dena Billetosa L Neuwirth Admit Date: 12/27/2018 12/31/2018 Arita Missyan B [place] Requesting Physician:  Dahal  Reason for Consult:  Hyponatremia HPI:  45F admitted 12/27/18 presenting with cough, leg edema, and confusion.    PMH Incudes:  Hx/o hyponatremia, considerations of low solute / hypovolemia and SIADH in past.  Most recent serum sodium > 130 as below  HTN  HLD  Chronic supplemental O2  GERD  Hx/o CVA  Anxiety/Depression  dCHF / pulmHTN  Hx/o CAD  Patient's presenting sodium was 12 which decreased to 121.  She originally received a brief course of saline prior to transitioning to sodium chloride tablets and tolvaptan.  Patient has received 3 doses of 50 mg of tolvaptan in the past 3 days.  Labs are also notable for significant right increase in serum bicarbonate, 47 today.  Venous blood gas 7.44/80 2/46.  Patient appears to have a chronic elevation of serum bicarbonate, usually less than 40.  Serum creatinine was 0.7 at admission, is similar today.  No nausea/vomiting reported.  No diarrhea.  Patient is on H2 blocker and PPI.  She is being evaluated by gastroenterology for dysphagia.  Family gives a report that patient was having very little oral intake prior to presentation.  Of note she is 3 kg less than her admission weight a year ago.  Urine studies on 1/26 with urine osmolality of 471, urine sodium of 44.  Serum Osmolality 258  No recorded weights since admission.  Home medications do not include thiazide diuretic, alkali therapy.  Patient is on demeclocycline, lisinopril.  Patient is 1.5 L negative from admission.  Sodium (mmol/L)  Date Value  12/31/2018 128 (L)  12/30/2018 128 (L)  12/30/2018 126 (L)  12/29/2018 125 (L)  12/29/2018 126 (L)  12/29/2018 124 (L)  12/29/2018 123 (L)  12/28/2018 121 (L)  12/28/2018 122 (L)  12/28/2018 123 (L)  12/04/2018 140  11/07/2018 134  10/09/2018 133 (L)  09/05/2018 140  08/12/2018 137  08/05/2018 139  05/29/2018 139   04/10/2018 140  03/25/2018 140  ] I/Os: I/O last 3 completed shifts: In: -  Out: 1400 [Urine:1400]   ROS Balance of 12 systems is negative w/ exceptions as above  PMH  Past Medical History:  Diagnosis Date  . Anxiety   . Chronic lower back pain   . Constipation    h/o fecal disimpaction on 2017  . CVA (cerebral vascular accident) (HCC) 2015   neg workup, MRI small acute lacunar infarcts. ASA only  . Depression   . Diastolic CHF, chronic (HCC)    grade I, most recent echo Jan 2019  . Fatty liver   . GERD (gastroesophageal reflux disease)   . Hyperlipidemia   . Hypertension   . Hyponatremia    multiple episodes with encephalopathy, renal thinks SIADH  . Hypothyroidism, unspecified   . Internal hemorrhoid   . NSTEMI (non-ST elevated myocardial infarction) (HCC) 08/28/2016  . Palpitations   . PFO with atrial septal aneurysm    TTE 2015  . Pneumonia 2016  . Severe pulmonary arterial systolic hypertension (HCC) 12/25/2017   per echo, on 1L oxgen via [place]   PSH  Past Surgical History:  Procedure Laterality Date  . ABDOMINAL HYSTERECTOMY    . CATARACT EXTRACTION W/ INTRAOCULAR LENS  IMPLANT, BILATERAL Bilateral   . CESAREAN SECTION    . TEE WITHOUT CARDIOVERSION N/A 01/19/2014   Procedure: TRANSESOPHAGEAL ECHOCARDIOGRAM (TEE);  Surgeon: Laurey Moralealton S McLean, MD;  [facility];  Service: Cardiovascular;  Laterality: N/A;  Kriste Basquedayna Fawn Kirk/ja  . VAGINAL HYSTERECTOMY     FH  Family History  Problem Relation Age of Onset  . Stroke Father   . Hypertension Father   . Colon cancer Cousin   . Colon cancer Other        Aunt  . Diabetes Other        aunt and cousins  . Heart disease Other        cousins  . Healthy Daughter   . Healthy Son    SH  reports that she has never smoked. She has quit using smokeless tobacco.  Her smokeless tobacco use included snuff. She reports that she does not drink alcohol or use drugs. Allergies  Allergies  Allergen Reactions  . Crestor  [Rosuvastatin Calcium] Other (See Comments)    Muscle Aches  . Keflex [Cephalexin] Other (See Comments)    dizziness  . Peanut Butter Flavor Hives  . Sulfa Antibiotics Hives  . Cephalexin Other (See Comments)    dizziness  . Sulfa Drugs Cross Reactors Rash   Home medications Prior to Admission medications   Medication Sig Start Date End Date Taking? Authorizing Provider  albuterol (PROVENTIL) (2.5 MG/3ML) 0.083% nebulizer solution Take 3 mLs (2.5 mg total) by nebulization every 4 (four) hours as needed for wheezing or shortness of breath. 10/08/18  Yes Icard, Rachel BoBradley L, DO  ALPRAZolam (XANAX) 1 MG tablet TAKE 1 TABLET BY MOUTH TWICE DAILY Patient taking differently: Take 0.25 mg by mouth 3 (three) times daily.  10/02/18  Yes Myles LippsSantiago, Irma M, MD  Aspirin 81 MG EC tablet Take 81 mg by mouth daily.   Yes [provider]  cetirizine (ZYRTEC) 10 MG tablet Take 10 mg by mouth daily.   Yes [provider]  demeclocycline (DECLOMYCIN) 300 MG tablet TK 1/2 T PO BID 12/04/18  Yes Myles LippsSantiago, Irma M, MD  docusate sodium (COLACE) 100 MG capsule Take 100 mg by mouth daily as needed for mild constipation.   Yes [provider]  fluticasone (FLONASE) 50 MCG/ACT nasal spray Place 1 spray into both nostrils 2 (two) times daily. 11/13/18  Yes Myles LippsSantiago, Irma M, MD  Glycerin, Laxative, (ADULT SUPPOSITORY RE) Place 1 Dose rectally as needed (costipation).   Yes [provider]  levothyroxine (SYNTHROID, LEVOTHROID) 50 MCG tablet Take 1 tablet (50 mcg total) by mouth daily before breakfast. 09/16/18  Yes Myles LippsSantiago, Irma M, MD  lisinopril (PRINIVIL,ZESTRIL) 5 MG tablet Take 2 tablets (10 mg total) by mouth daily. Patient taking differently: Take 5 mg by mouth daily.  06/25/18  Yes Mesner, Barbara CowerJason, MD  meclizine (ANTIVERT) 12.5 MG tablet Take 1 tablet by mouth as needed for dizziness 12/20/18  Yes Stallings, Zoe A, MD  olopatadine (PATANOL) 0.1 % ophthalmic solution Place 1 drop into  both eyes daily as needed for allergies. 03/25/18  Yes Myles LippsSantiago, Irma M, MD  omeprazole (PRILOSEC) 20 MG capsule Take 20 mg by mouth every evening.  12/09/18  Yes [provider]  propranolol ER (INDERAL LA) 60 MG 24 hr capsule TAKE 1 CAPSULE BY MOUTH DAILY 11/13/18  Yes Myles LippsSantiago, Irma M, MD  ranitidine (ZANTAC) 300 MG tablet Take 300 mg by mouth at bedtime.  10/12/18  Yes [provider]  amLODipine (NORVASC) 5 MG tablet Take 1 tablet (5 mg total) by mouth daily. Patient not taking: Reported on 12/20/2018 07/08/18   Myles LippsSantiago, Irma M, MD  azelastine (ASTELIN) 0.1 % nasal spray Place 1 spray into both nostrils 2 (two) times daily. Use  in each nostril as directed Patient not taking: Reported on 12/27/2018 05/29/18   Myles Lipps, MD  famotidine (PEPCID) 20 MG tablet Take 1 tablet (20 mg total) by mouth daily. Take 1 tablet at Bedtime daily. Patient not taking: Reported on 12/20/2018 12/16/18   Myles Lipps, MD  fluconazole (DIFLUCAN) 150 MG tablet Take 1 tablet (150 mg total) by mouth every 3 (three) days. Patient not taking: Reported on 12/27/2018 12/20/18   Doristine Bosworth, MD  pantoprazole (PROTONIX) 40 MG tablet Take 1 tablet (40 mg total) by mouth daily. Patient not taking: Reported on 12/27/2018 12/04/18   Myles Lipps, MD    Current Medications Scheduled Meds: . amLODipine  10 mg Oral Daily  . aspirin EC  81 mg Oral Daily  . enoxaparin (LOVENOX) injection  40 mg Subcutaneous Daily  . famotidine  20 mg Oral QHS  . feeding supplement  1 Container Oral TID BM  . fluticasone  1 spray Each Nare BID  . levothyroxine  50 mcg Oral QAC breakfast  . loratadine  10 mg Oral Daily  . mouth rinse  15 mL Mouth Rinse BID  . pantoprazole  40 mg Oral Daily  . propranolol ER  60 mg Oral Daily   Continuous Infusions: . sodium chloride    . piperacillin-tazobactam (ZOSYN)  IV     PRN Meds:.acetaminophen **OR** acetaminophen, albuterol, ALPRAZolam, alum & mag hydroxide-simeth,  dextromethorphan-guaiFENesin, docusate sodium, hydrALAZINE, meclizine, olopatadine, ondansetron **OR** ondansetron (ZOFRAN) IV, senna-docusate  CBC Recent Labs  Lab 12/27/18 1834  12/29/18 0207 12/30/18 0548 12/31/18 0233  WBC 7.7   < > 13.3* 11.6* 9.2  NEUTROABS 5.2  --   --   --   --   HGB 11.2*   < > 9.8* 9.9* 10.5*  HCT 36.6   < > 31.9* 33.0* 35.7*  MCV 92.4   < > 94.4 95.1 97.3  PLT 238   < > 205 228 233   < > = values in this interval not displayed.   Basic Metabolic Panel Recent Labs  Lab 12/27/18 1834 12/27/18 2346 12/28/18 0559  12/29/18 0207 12/29/18 0755 12/29/18 1322 12/29/18 1936 12/30/18 0116 12/30/18 0548 12/31/18 0233  NA 122* 121* 123*   < > 123* 124* 126* 125* 126* 128* 128*  K 4.0 3.7 3.5  --  3.5  --   --   --   --  4.1 4.4  CL <65* <65* 67*  --  69*  --   --   --   --  70* 71*  CO2 43* 43* 44*  --  44*  --   --   --   --  47* 44*  GLUCOSE 83 85 87  --  133*  --   --   --   --  130* 116*  BUN 19 19 22   --  30*  --   --   --   --  31* 27*  CREATININE 0.70 0.63 0.59  --  0.96  --   --   --   --  0.87 0.79  CALCIUM 9.5 9.3 9.1  --  9.3  --   --   --   --  9.9 10.0   < > = values in this interval not displayed.    Physical Exam  Blood pressure (!) 167/61, pulse 84, temperature 97.9 F (36.6 C), temperature source Oral, resp. rate (!) 23, height 5\' 2"  (1.575 m), weight 78.8 kg, SpO2 100 %.  GEN: Elderly female, chronically ill-appearing, softly spoken ENT: NCAT EYES: EOMI CV: RRR, normal S1 and S2 PULM: Diminished in the bases, no wheezing, no rales ABD: Soft, nontender SKIN: No rashes or lesions EXT: No significant lower extremity edema  Assessment 74F with Asymptomatic Hypotonic Hyponatremia; volume status is difficult to ascertain, does not appear to be overtly hypervolemic; I wonder if she is hypovolemic related to poor oral intake.. Patient has a worsened but chronic hypochloremic metabolic alkalosis  1. Asymptomatic Hypotonic  Hyponatremia 1. DDx is recurrent SIADH vs poor solute intake 1. U studies most consistent with SIADH but history suggests poor solute intake 2. TSH was WNL,  3. Has improved with Vaptan and salt tabs; not surprising or helpful necessarily  4. No need for 3% saline here 5. I slightly favor low solute intake; esp as per below 1. Start BOost TID 2. 112mL/hr NS 3. q4h BMP 4. Stop NS if SNa starts to fall 5. Stop Vaptan, not an outpatient option 2. Hypochloremic Metabolc Alkalosis 1. Not having overt GI losses 2. Not on diruetics as outpatient 3. Is this all contraction alkalosis? 4. PH on ABG shows near full correction, so likley this is mixed with resp acidosis as well, COPD? 5. Try volume expansion overnight and see how she does 3. Dysphagia, likely poor solute intake, GI following    Plan  1. As above   Sabra Heck MD [phone] pgr 12/31/2018, 3:00 PM

## 2019-01-01 ENCOUNTER — Inpatient Hospital Stay (HOSPITAL_COMMUNITY): Payer: Medicare Other

## 2019-01-01 ENCOUNTER — Inpatient Hospital Stay (HOSPITAL_COMMUNITY)
Admit: 2019-01-01 | Discharge: 2019-01-01 | Disposition: A | Discharge status: Home / Self Care | Payer: Medicare Other | Attending: Neurology | Admitting: Neurology

## 2019-01-01 DIAGNOSIS — R0689 Other abnormalities of breathing: Secondary | ICD-10-CM

## 2019-01-01 DIAGNOSIS — G9341 Metabolic encephalopathy: Secondary | ICD-10-CM

## 2019-01-01 DIAGNOSIS — R1310 Dysphagia, unspecified: Secondary | ICD-10-CM

## 2019-01-01 DIAGNOSIS — E871 Hypo-osmolality and hyponatremia: Secondary | ICD-10-CM

## 2019-01-01 DIAGNOSIS — J9622 Acute and chronic respiratory failure with hypercapnia: Secondary | ICD-10-CM

## 2019-01-01 LAB — BLOOD GAS, ARTERIAL
Acid-Base Excess: 20.7 mmol/L — ABNORMAL HIGH (ref 0.0–2.0)
Acid-Base Excess: 20.7 mmol/L — ABNORMAL HIGH (ref 0.0–2.0)
Acid-Base Excess: 21.6 mmol/L — ABNORMAL HIGH (ref 0.0–2.0)
BICARBONATE: 53.1 mmol/L — AB (ref 20.0–28.0)
Bicarbonate: 48.5 mmol/L — ABNORMAL HIGH (ref 20.0–28.0)
Bicarbonate: 48.3 mmol/L — ABNORMAL HIGH (ref 20.0–28.0)
DRAWN BY: 235321
Delivery systems: POSITIVE
Delivery systems: POSITIVE
Drawn by: 331471
Drawn by: 422461
Expiratory PAP: 5
Expiratory PAP: 5
FIO2: 30
FIO2: 30
Inspiratory PAP: 12
Inspiratory PAP: 12
O2 Content: 5 L/min
O2 SAT: 91.9 %
O2 Saturation: 88.8 %
O2 Saturation: 98.7 %
PH ART: 7.419 (ref 7.350–7.450)
Patient temperature: 97.6
Patient temperature: 98.6
Patient temperature: 98.6
RATE: 8 {breaths}/min
pCO2 arterial: 113 mmHg (ref 32.0–48.0)
pCO2 arterial: 72 mmHg (ref 32.0–48.0)
pCO2 arterial: 76.4 mmHg (ref 32.0–48.0)
pH, Arterial: 7.294 — ABNORMAL LOW (ref 7.350–7.450)
pH, Arterial: 7.439 (ref 7.350–7.450)
pO2, Arterial: 157 mmHg — ABNORMAL HIGH (ref 83.0–108.0)
pO2, Arterial: 54.9 mmHg — ABNORMAL LOW (ref 83.0–108.0)
pO2, Arterial: 63.7 mmHg — ABNORMAL LOW (ref 83.0–108.0)

## 2019-01-01 LAB — CBC WITH DIFFERENTIAL/PLATELET
Abs Immature Granulocytes: 0.04 10*3/uL (ref 0.00–0.07)
Basophils Absolute: 0 10*3/uL (ref 0.0–0.1)
Basophils Relative: 0 %
EOS PCT: 0 %
Eosinophils Absolute: 0 10*3/uL (ref 0.0–0.5)
HCT: 34.8 % — ABNORMAL LOW (ref 36.0–46.0)
Hemoglobin: 10.1 g/dL — ABNORMAL LOW (ref 12.0–15.0)
Immature Granulocytes: 0 %
LYMPHS ABS: 2 10*3/uL (ref 0.7–4.0)
Lymphocytes Relative: 17 %
MCH: 28.8 pg (ref 26.0–34.0)
MCHC: 29 g/dL — ABNORMAL LOW (ref 30.0–36.0)
MCV: 99.1 fL (ref 80.0–100.0)
Monocytes Absolute: 1 10*3/uL (ref 0.1–1.0)
Monocytes Relative: 9 %
NRBC: 0 % (ref 0.0–0.2)
Neutro Abs: 8.2 10*3/uL — ABNORMAL HIGH (ref 1.7–7.7)
Neutrophils Relative %: 74 %
Platelets: 215 10*3/uL (ref 150–400)
RBC: 3.51 MIL/uL — ABNORMAL LOW (ref 3.87–5.11)
RDW: 13.1 % (ref 11.5–15.5)
WBC: 11.3 10*3/uL — ABNORMAL HIGH (ref 4.0–10.5)

## 2019-01-01 LAB — BASIC METABOLIC PANEL
ANION GAP: 7 (ref 5–15)
BUN: 17 mg/dL (ref 8–23)
CO2: 46 mmol/L — ABNORMAL HIGH (ref 22–32)
Calcium: 9.3 mg/dL (ref 8.9–10.3)
Chloride: 78 mmol/L — ABNORMAL LOW (ref 98–111)
Creatinine, Ser: 0.65 mg/dL (ref 0.44–1.00)
GFR calc non Af Amer: >60 mL/min (ref >60)
Glucose, Bld: 114 mg/dL — ABNORMAL HIGH (ref 70–99)
Potassium: 3.7 mmol/L (ref 3.5–5.1)
Sodium: 131 mmol/L — ABNORMAL LOW (ref 135–145)

## 2019-01-01 LAB — PROCALCITONIN

## 2019-01-01 LAB — SODIUM: Sodium: 130 mmol/L — ABNORMAL LOW (ref 135–145)

## 2019-01-01 LAB — COMPREHENSIVE METABOLIC PANEL
ALBUMIN: 2.9 g/dL — AB (ref 3.5–5.0)
ALT: 13 U/L (ref 0–44)
AST: 43 U/L — ABNORMAL HIGH (ref 15–41)
Alkaline Phosphatase: 52 U/L (ref 38–126)
Anion gap: 6 (ref 5–15)
BUN: 25 mg/dL — ABNORMAL HIGH (ref 8–23)
CO2: 48 mmol/L — ABNORMAL HIGH (ref 22–32)
Calcium: 9.7 mg/dL (ref 8.9–10.3)
Chloride: 74 mmol/L — ABNORMAL LOW (ref 98–111)
Creatinine, Ser: 0.9 mg/dL (ref 0.44–1.00)
GFR calc Af Amer: >60 mL/min (ref >60)
GFR calc non Af Amer: 56 mL/min — ABNORMAL LOW (ref >60)
Glucose, Bld: 121 mg/dL — ABNORMAL HIGH (ref 70–99)
Potassium: 4.1 mmol/L (ref 3.5–5.1)
Sodium: 128 mmol/L — ABNORMAL LOW (ref 135–145)
Total Bilirubin: 0.5 mg/dL (ref 0.3–1.2)
Total Protein: 6.2 g/dL — ABNORMAL LOW (ref 6.5–8.1)

## 2019-01-01 LAB — BASIC METABOLIC PANEL WITH GFR
Anion gap: 6 (ref 5–15)
BUN: 20 mg/dL (ref 8–23)
CO2: 47 mmol/L — ABNORMAL HIGH (ref 22–32)
Calcium: 9 mg/dL (ref 8.9–10.3)
Chloride: 78 mmol/L — ABNORMAL LOW (ref 98–111)
Creatinine, Ser: 0.74 mg/dL (ref 0.44–1.00)
GFR calc Af Amer: >60 mL/min
GFR calc non Af Amer: >60 mL/min
Glucose, Bld: 153 mg/dL — ABNORMAL HIGH (ref 70–99)
Potassium: 3.6 mmol/L (ref 3.5–5.1)
Sodium: 131 mmol/L — ABNORMAL LOW (ref 135–145)

## 2019-01-01 LAB — TROPONIN I
Troponin I: <0.03 ng/mL (ref <0.03)
Troponin I: 0.03 ng/mL (ref <0.03)
Troponin I: <0.03 ng/mL (ref <0.03)

## 2019-01-01 LAB — MAGNESIUM: Magnesium: 2.2 mg/dL (ref 1.7–2.4)

## 2019-01-01 LAB — LACTIC ACID, PLASMA: Lactic Acid, Venous: 1 mmol/L (ref 0.5–1.9)

## 2019-01-01 MED ORDER — LEVETIRACETAM IN NACL 1000 MG/100ML IV SOLN
1000.0000 mg | INTRAVENOUS | Status: AC
  Administered 2019-01-01: 1000 mg via INTRAVENOUS
  Filled 2019-01-01: qty 100

## 2019-01-01 MED ORDER — LEVOTHYROXINE SODIUM 50 MCG PO TABS
50.0000 ug | ORAL_TABLET | Freq: Every day | ORAL | Status: DC
  Administered 2019-01-01 – 2019-01-08 (×5): 50 ug via ORAL
  Filled 2019-01-01 (×6): qty 1

## 2019-01-01 MED ORDER — LORAZEPAM 2 MG/ML IJ SOLN: 1.0000 mg | Freq: Four times a day (QID) | INTRAMUSCULAR | Status: DC | PRN

## 2019-01-01 MED ORDER — VANCOMYCIN HCL 10 G IV SOLR
1500.0000 mg | Freq: Once | INTRAVENOUS | Status: AC
  Administered 2019-01-01: 1500 mg via INTRAVENOUS
  Filled 2019-01-01: qty 1500

## 2019-01-01 MED ORDER — LEVETIRACETAM 500 MG PO TABS
500.0000 mg | ORAL_TABLET | Freq: Two times a day (BID) | ORAL | Status: DC
  Filled 2019-01-01: qty 1

## 2019-01-01 MED ORDER — CHLORHEXIDINE GLUCONATE 0.12 % MT SOLN
15.0000 mL | Freq: Two times a day (BID) | OROMUCOSAL | Status: DC
  Administered 2019-01-01 – 2019-01-08 (×10): 15 mL via OROMUCOSAL
  Filled 2019-01-01 (×12): qty 15

## 2019-01-01 MED ORDER — VANCOMYCIN HCL 10 G IV SOLR
1250.0000 mg | INTRAVENOUS | Status: DC
  Filled 2019-01-01: qty 1250

## 2019-01-01 MED ORDER — LORAZEPAM 2 MG/ML IJ SOLN: 0.5000 mg | Freq: Four times a day (QID) | INTRAMUSCULAR | Status: DC | PRN

## 2019-01-01 MED ORDER — LEVOTHYROXINE SODIUM 100 MCG/5ML IV SOLN: 25.0000 ug | Freq: Every day | INTRAVENOUS | Status: DC

## 2019-01-01 NOTE — Progress Notes (Signed)
Craige Cotta, NP at bedside. Pt not responsive and was found to be hypotensive. Neo ordered, waiting for IV team to place new peripheral IV before hanging. ABG also ordered. Will continue to monitor.

## 2019-01-01 NOTE — Significant Event (Addendum)
Brief hx: Pt is a 85 yo AAF with a multiple of health issues, including, SIADH, hyponatremia, HTN, HSK, chronic respiratory failure on 2L O2 per [place] at home, hx CVA, depression, anxiety, CHF diastolic type II, severe pulmonary hypertension, CAD with hx NSTEMI, and obesity among others.  Pt was admitted by [facility] on 12/27/2018 after coming to ED with c/o cough, swelling, and mild confusion per son. She was oriented x 3 on admission. She was found to have hyponatremia with respiratory failure and accelerated HTN. Home BP meds were given and Hydralazine was ordered prn with resultant lowering of BPs. She has been running in the 140-160 range for past few days until significant acute drop tonight. Nephrology saw her for the hyponatremia and pt was placed on .9% NS at 100cc/hr and Na has increased from 121 to 129. Nephrology stated they though it was hypotonic hyponatremia with hypochloremic metabolic alkalosis and possibly mixed with respiratory acidosis given venous blood gas results. GI was consulted for dysphagia and pt was found to have presbyesophagus but could not have any further tests due to her mental status. She was placed on a dysphagia diet and Zosyn for suspected aspiration PNA.  Her venous blood gas 12/31/2018 showed a pH of 7.4, PCO2 of 82 and PO2 of 45. Pt was satting normally at that time with no change in mental status (a little confused and sleepy at times). Bipap was postponed thinking this was likely her baseline PCO2 given her chronic respiratory failure and because her O2 sat was normal and she was no increasingly lethargic.   Event: Tonight, RN told NP that pt was more lethargic than earlier, but her O2 saturation was normal and she had no increased WOB. NP saw earlier venous gas and spoke with RT. Per RT, day RT spoke with MD and decided pt did not need bipap since she was compensated on gas.  NP to bedside.  S: Pt can not participate in ROS due to unresponsiveness.  O: Acute on chronically  ill obese 85 yo AAF in NAD. Eyes closed and does not open them on command. Non verbal. Not responsive even to sternal rub. Respirations are even and unlabored and O2 sat is still normal. Card-+ murmur with SR. No movement of extremities spontaneously. No swelling of LEs, but bilateral UE swelling. No redness.   A/P: 1. Chronic respiratory failure with hypercarbia-Decided to place pt on bipap at that time but this was postponed due to acute event below. Later, on reassessment after CT, pt's O2 sat is normal without any WOB noted and she is alert and talking.    2. Hypotension-15 mins after NP exited room re: #1, the RN came and stated pt's BP was in the 60s. NP back to bedside. Pt acutely became hypotensive with manual BP down to 70/20. This is likely contributing to or totally responsible for her mental status. Lost IV access after starting a bolus, but IV team got IV access. IVF + Neo were started after speaking to PCCM. NP did not ask PCCM to come stat because NP was trying to address code status. Neo at 10 mcgs raised BP, so titrated down to 5mcgs then to 2.365mcgs with resultant BP in the 150-170 range with MAP in the 90s. Neo stopped later with BP holding and MAP in the 70s. Do not require critical care consult at this point since pt's improvement and pressors have been stopped. Checked LA which was normal. LFTs normal. Serum bicarb 48 which is same as  previous. Check troponins.  3. Code status-NP has spoken to several different family members tonight, including the POA, other children and grandchildren. NP has spoken to them over a total period of about 1.5 hours in different meetings with various family members present. The last meeting, I spoke to the entire family in the waiting room for another 20 mins or so and explained the entire health picture of pt, the acute events of tonight, the treatment plan and again reviewed code status. NP explained that given the pt's age and co morbidities along with acute  illness, there was a very poor chance that staff would be able to resuscitate her. NP explained that during CPR, pt would likely be in pain, that we could break ribs and cause the pt suffering. Explained to family that NP doubted if pt would be able to get off a ventilator if put on one and that her quality of life would be much worse after these types of events. Even after about 4 conversations about this, the family still elects FULL CODE. .  4. ? Seizure activity-secondary to hyponatremia. On next assessment of pt, her eyes were twitching as well as her left cheek. Na level decreased by 1 to 128. This seizure like activity is completely new. She grips bilaterally and moves her feet. Does not follow any other commands and remains non verbal. NP called neurologist on call and discussed pt and findings. We discussed Ativan, but NP is cautious given the pt's BP and respiratory issues. Will give 1gm Keppra now, obtain a CT head, and speak to neuro after that. Later, spoke with neuro after CT head resulted. CT head per neuro shows nothing acutely abnormal. He will come and see pt tonight.  NP reassessed pt after this and found her to be very responsive now, talking to NP and even singing a song. There is noted occasional eye twitching noted, but none in cheek area. No seizure activity noted to extremities. She follows commands now and her speech is clear. Will defer to neuro to schedule/continue Keppra.  5. Hyponatremia-nephrology on board. We have not corrected her Na too quickly, so doubt demyelination (neuro agreed). Continue NS at 100cc/hr and recheck Na every 4-6 hours. Pt's creatinine has been normal, although bumped slightly tonight. See #4 about seizure activity.  6. Dysphagia with aspiration PNA-awaiting CXR. Do not want to use Bipap if aspiration is present. On Zosyn already.  7. ? Infection-awaiting CXR. WBCC has increased as well as % of neutrophils. Probably aspiration. See #6. LA normal. Check  procalcitonin. CXR resulted as worsening airspace disease LLL atel vs PNA which is new compared to old CXR and CT chest on admission. Given her length of stay, will add Vancomycin to cover for HCAP. Continue Zosyn.  8. Aspiration and ? HCAP. See #7.  9. Hypothyroidism-changed Synthroid to IV. Last TSH normal.  10. Nutrition-Now, that she is responsive, can restart dysphagia diet after neuro sees her.  Update: Neuro at bedside at 0405. Suggests 500mg  keppra bid and agrees that given her recovery after Keppra, this was likely focal seizure activity.  Awaiting additional labs.  Update: Pt still awake and talking. No further focal seizure activity noted. Troponin neg. Procalc <.10. Will leave de escalation or discontinuation of abx to attending. Placed pt back on dysphagia 3 diet. Changed meds back to po. Will ask attending to ensure speech is seeing her in case her diet needs to change.  NP has updated family several times during the night.  Total critical care time: Start 2200 End 0000 for 160 mins. Restart at 0030 end 0200 for 90 mins. total minutes 250 Critical care time was exclusive of separately billable procedures and treating other patients. This time also includes multiple meeting with family members for counseling and coordination of care.  Critical care was necessary to treat or prevent imminent or life-threatening deterioration. Critical care was time spent personally by me on the following activities: development of treatment plan with patient and/or surrogate as well as nursing, discussions with consultants, evaluation of patient's response to treatment, examination of patient, obtaining history from patient or surrogate, ordering and performing treatments and interventions, ordering and review of laboratory studies, ordering and review of radiographic studies, pulse oximetry and re-evaluation of patient's condition. KJKG, NP [facility]

## 2019-01-01 NOTE — Consult Note (Signed)
Requesting Physician: Craige Cotta, NP     Chief Complaint: Lethargic, twitching of eyelids and right cheek  History obtained from: Patient and Chart    HPI:                                                                                                                                       Tammy Fernandez is an 84 y.o. female with past medical history of SIADH, hyponatremia, hypertension, chronic respiratory failure, CVA, CHF diastolic type II, severe pulmonary hypertension, coronary artery disease, obesity admitted to [facility] long hospital for respiratory failure, hyponatremia with sodium of 121 and accelerated hypertension.  Tonight night patient became more lethargic than she was earlier.  Hospitalist nurse practitioner evaluated the patient at bedside, patient noted to be hypotensive and was started on neo-and IV fluids.  She noticed patient was have rhythmic twitching of her eyes and rhythmic twitching of her right face.  Neurology was called (myself) and recommended stat CT head and IV Ativan.  Stat head CT was negative for acute findings.  Repeat sodium was 128.  After receiving IV Ativan and Keppra patient became more alert and started to speak and follow commands.   Past Medical History:  Diagnosis Date  . Anxiety   . Chronic lower back pain   . Constipation    h/o fecal disimpaction on 2017  . CVA (cerebral vascular accident) (HCC) 2015   neg workup, MRI small acute lacunar infarcts. ASA only  . Depression   . Diastolic CHF, chronic (HCC)    grade I, most recent echo Jan 2019  . Fatty liver   . GERD (gastroesophageal reflux disease)   . Hyperlipidemia   . Hypertension   . Hyponatremia    multiple episodes with encephalopathy, renal thinks SIADH  . Hypothyroidism, unspecified   . Internal hemorrhoid   . NSTEMI (non-ST elevated myocardial infarction) (HCC) 08/28/2016  . Palpitations   . PFO with atrial septal aneurysm    TTE 2015  . Pneumonia 2016  . Severe pulmonary arterial  systolic hypertension (HCC) 12/25/2017   per echo, on 1L oxgen via [place]    Past Surgical History:  Procedure Laterality Date  . ABDOMINAL HYSTERECTOMY    . CATARACT EXTRACTION W/ INTRAOCULAR LENS  IMPLANT, BILATERAL Bilateral   . CESAREAN SECTION    . TEE WITHOUT CARDIOVERSION N/A 01/19/2014   Procedure: TRANSESOPHAGEAL ECHOCARDIOGRAM (TEE);  Surgeon: Laurey Morale, MD;  Location: [facility];  Service: Cardiovascular;  Laterality: N/A;  [name]  . VAGINAL HYSTERECTOMY      Family History  Problem Relation Age of Onset  . Stroke Father   . Hypertension Father   . Colon cancer Cousin   . Colon cancer Other        Aunt  . Diabetes Other        aunt and cousins  . Heart disease Other  cousins  . Healthy Daughter   . Healthy Son    Social History:  reports that she has never smoked. She has quit using smokeless tobacco.  Her smokeless tobacco use included snuff. She reports that she does not drink alcohol or use drugs.  Allergies:  Allergies  Allergen Reactions  . Crestor [Rosuvastatin Calcium] Other (See Comments)    Muscle Aches  . Keflex [Cephalexin] Other (See Comments)    dizziness  . Peanut Butter Flavor Hives  . Sulfa Antibiotics Hives  . Cephalexin Other (See Comments)    dizziness  . Sulfa Drugs Cross Reactors Rash    Medications:                                                                                                                        I reviewed home medications   ROS:                                                                                                                                     14 systems reviewed and negative except above   Examination:                                                                                                      General: Appears well-developed and well-nourished.  Psych: Affect appropriate to situation Eyes: No scleral injection HENT: No OP obstrucion Head: Normocephalic.   Cardiovascular: Normal rate and regular rhythm.  Respiratory: Effort normal and breath sounds normal to anterior ascultation GI: Soft.  No distension. There is no tenderness.  Skin: Hyperpigmentation over shins and arms   Neurological Examination Mental Status: Alert, oriented, thought content appropriate.  Speech fluent without evidence of aphasia. Able to follow 3 step commands without difficulty. Cranial Nerves: II: Visual fields grossly normal,  III,IV, VI: ptosis not present, extra-ocular motions intact bilaterally, pupils equal, round, reactive to light and accommodation V,VII: smile symmetric, facial light touch sensation normal bilaterally VIII: hearing normal bilaterally IX,X: uvula  rises symmetrically XI: bilateral shoulder shrug XII: midline tongue extension Motor: Right : Upper extremity   5/5    Left:     Upper extremity   5/5  Lower extremity   5/5     Lower extremity   5/5 Tone and bulk:normal tone throughout; no atrophy noted Sensory: Pinprick and light touch intact throughout, bilaterally Deep Tendon Reflexes: 1+ bilateral patella and biceps Plantars: Right: downgoing   Left: downgoing Cerebellar: normal finger-to-nose, normal rapid alternating movements and normal heel-to-shin test Gait: normal gait and station     Lab Results: Basic Metabolic Panel: Recent Labs  Lab 12/27/18 2346  12/31/18 0233 12/31/18 1428 12/31/18 1740 12/31/18 2202 01/01/19 0021  NA 121*   < > 128* 129* 129* 129* 128*  K 3.7   < > 4.4 4.3 4.3 4.3 4.1  CL <65*   < > 71* 70* 71* 73* 74*  CO2 43*   < > 44* 47* 50* 48* 48*  GLUCOSE 85   < > 116* 143* 165* 147* 121*  BUN 19   < > 27* 26* 24* 25* 25*  CREATININE 0.63   < > 0.79 0.76 0.76 0.83 0.90  CALCIUM 9.3   < > 10.0 10.0 10.2 9.8 9.7  MG 1.2*  --   --   --   --   --  2.2   < > = values in this interval not displayed.    CBC: Recent Labs  Lab 12/27/18 1834 12/28/18 0559 12/29/18 0207 12/30/18 0548 12/31/18 0233  01/01/19 0021  WBC 7.7 10.2 13.3* 11.6* 9.2 11.3*  NEUTROABS 5.2  --   --   --   --  8.2*  HGB 11.2* 10.2* 9.8* 9.9* 10.5* 10.1*  HCT 36.6 33.0* 31.9* 33.0* 35.7* 34.8*  MCV 92.4 93.8 94.4 95.1 97.3 99.1  PLT 238 235 205 228 233 215    Coagulation Studies: No results for input(s): LABPROT, INR in the last 72 hours.  Imaging: Dg Chest 2 View  Result Date: 12/31/2018 CLINICAL DATA:  85 year old female with a history of acute hypoxic respiratory failure. EXAM: CHEST - 2 VIEW COMPARISON:  CT 12/27/2018, plain film 12/27/2018, 06/10/2018, 12/28/2017, 12/24/2017, CT 12/21/2017 FINDINGS: Significantly limited chest x-ray given the patient's positioning and body habitus. Overall, the cardiomediastinal silhouette is unchanged compared to the prior plain films. There is persistent elevation of the right hemidiaphragm. This is more evident on the lateral view. On the lateral view there is increasing opacity at the posterior aspect of the right hemidiaphragm, which corresponds to the finding on recent CT of consolidation/volume loss at the right lung base. No new large pleural effusion or pneumothorax. IMPRESSION: Limited plain film demonstrates persistent low lung volumes, crowded central vessels, and right hemidiaphragm elevation. Opacity at the right lung base corresponds to findings of right lower lobe consolidation/atelectasis on prior CT. Electronically Signed   By: Gilmer MorJaime  Wagner D.O.   On: 12/31/2018 13:51   Ct Head Wo Contrast  Result Date: 01/01/2019 CLINICAL DATA:  85 y/o  F; unresponsive, hypotensive, disoriented. EXAM: CT HEAD WITHOUT CONTRAST TECHNIQUE: Contiguous axial images were obtained from the base of the skull through the vertex without intravenous contrast. COMPARISON:  12/29/2018 CT head. FINDINGS: Brain: No evidence of acute infarction, hemorrhage, hydrocephalus, extra-axial collection or mass lesion/mass effect. Stable 4 mm cerebellar tonsillar ectopia with rounded appearance of the  cerebellar tonsils, probably normal variant. Stable chronic microvascular ischemic changes and volume loss of the brain. Vascular: Calcific atherosclerosis of the  carotid siphons. No hyperdense vessel identified. Skull: Normal. Negative for fracture or focal lesion. Sinuses/Orbits: No acute finding. Bilateral intra-ocular lens replacement. Other: None. IMPRESSION: 1. No acute intracranial abnormality. 2. Stable chronic microvascular ischemic changes and volume loss of the brain. Electronically Signed   By: Mitzi Hansen M.D.   On: 01/01/2019 02:37   Dg Chest Port 1 View  Result Date: 01/01/2019 CLINICAL DATA:  Respiratory distress EXAM: PORTABLE CHEST 1 VIEW COMPARISON:  12/31/2018, 12/29/2018 FINDINGS: Cardiomegaly with bilateral pleural effusions. Low lung volumes. Worsening airspace disease at the left base. Persistent airspace disease at the right base. Vascular congestion and probable pulmonary edema. Aortic atherosclerosis. Residual contrast within high-riding colon within the right upper quadrant. IMPRESSION: Low lung volumes. Worsening airspace disease at the left lung base, atelectasis versus pneumonia. Probable small pleural effusions. Continued atelectasis/consolidation at right base. Enlarged cardiomediastinal silhouette with vascular congestion and probable mild hazy pulmonary edema Electronically Signed   By: Jasmine Pang M.D.   On: 01/01/2019 02:11   Dg Swallowing Func-speech Pathology  Result Date: 12/30/2018 Objective Swallowing Evaluation: Type of Study: MBS-Modified Barium Swallow Study  Patient Details Name: SREENIDHI GANSON MRN: [id number] Date of Birth: December 14, 1923 Today's Date: 12/30/2018 Time: SLP Start Time (ACUTE ONLY): 1006 -SLP Stop Time (ACUTE ONLY): 1027 SLP Time Calculation (min) (ACUTE ONLY): 21 min Past Medical History: Past Medical History: Diagnosis Date . Anxiety  . Chronic lower back pain  . Constipation   h/o fecal disimpaction on 2017 . CVA (cerebral vascular  accident) (HCC) 2015  neg workup, MRI small acute lacunar infarcts. ASA only . Depression  . Diastolic CHF, chronic (HCC)   grade I, most recent echo Jan 2019 . Fatty liver  . GERD (gastroesophageal reflux disease)  . Hyperlipidemia  . Hypertension  . Hyponatremia   multiple episodes with encephalopathy, renal thinks SIADH . Hypothyroidism, unspecified  . Internal hemorrhoid  . NSTEMI (non-ST elevated myocardial infarction) (HCC) 08/28/2016 . Palpitations  . PFO with atrial septal aneurysm   TTE 2015 . Pneumonia 2016 . Severe pulmonary arterial systolic hypertension (HCC) 12/25/2017  per echo, on 1L oxgen via  Past Surgical History: Past Surgical History: Procedure Laterality Date . ABDOMINAL HYSTERECTOMY   . CATARACT EXTRACTION W/ INTRAOCULAR LENS  IMPLANT, BILATERAL Bilateral  . CESAREAN SECTION   . TEE WITHOUT CARDIOVERSION N/A 01/19/2014  Procedure: TRANSESOPHAGEAL ECHOCARDIOGRAM (TEE);  Surgeon: Laurey Morale, MD;  Location: [facility] END[facility];  Service: Cardiovascular;  Laterality: N/A;  [name] . VAGINAL HYSTERECTOMY   HPI: 85 year old female admitted 12/27/2018 with cough, leg edema and mild confusion. PMH: SIADH on Depakote cycling, hypothyroidism, hypertension, hyperlipidemia, COPD on 2 liters chronically, history of CVA, grade 2 diastolic dysfunction and severe pulmonary hypertension by echo on 12/20/2017.  Pt was scheduled for OP MBS last week but did not come for testing - ? due to illness.  Per notes, pt reports problems with meats and breads.   Subjective: pt awake in flouro chair Assessment / Plan / Recommendation CHL IP CLINICAL IMPRESSIONS 12/30/2018 Clinical Impression Suboptimal view secondary to pt's shoulder elevated and blocking view at times.  Pt presents with mild oral dysphagia and overall functional pharyngeal swallow.  Decreased lingual strength/coordination results in decreased oral manipulation/cohesion and lingual pumping with premature spillage of boluses into pharynx.  Pharyngeal swallow  largely intact with only mild laryngeal penetration.  Compensation strategies not tested due to pt's decreased ability to follow directions.  Pt did not aspirate despite being challenged to sequential boluses  of thin.  Trace laryngeal penetration did not clear with cued cough/throat clear.  Pt appeared with secretions retained in pharynx.  Oral secretions removed by this SlP prior to barium administration.  Dependent on results of esophagram, recommend dys3/ground meats/extra gravy/sauces and thin liquids.  Medications with puree - whole.  SLP will follow up for family/pt education.  Suspect primary source of pt's dysphagia at this time is her mentation.   SLP Visit Diagnosis Dysphagia, oral phase (R13.11) Attention and concentration deficit following -- Frontal lobe and executive function deficit following -- Impact on safety and function Moderate aspiration risk   CHL IP TREATMENT RECOMMENDATION 12/30/2018 Treatment Recommendations Therapy as outlined in treatment plan below   Prognosis 12/30/2018 Prognosis for Safe Diet Advancement Fair Barriers to Reach Goals -- Barriers/Prognosis Comment -- CHL IP DIET RECOMMENDATION 12/30/2018 SLP Diet Recommendations Dysphagia 3 (Mech soft) solids;Thin liquid Liquid Administration via -- Medication Administration Whole meds with puree Compensations Slow rate;Small sips/bites;Other (Comment) Postural Changes --   CHL IP OTHER RECOMMENDATIONS 12/30/2018 Recommended Consults Other (Comment) Oral Care Recommendations Oral care before and after PO Other Recommendations Have oral suction available   CHL IP FOLLOW UP RECOMMENDATIONS 12/30/2018 Follow up Recommendations (No Data)   CHL IP FREQUENCY AND DURATION 12/30/2018 Speech Therapy Frequency (ACUTE ONLY) min 2x/week Treatment Duration 1 week      CHL IP ORAL PHASE 12/30/2018 Oral Phase Impaired Oral - Pudding Teaspoon -- Oral - Pudding Cup -- Oral - Honey Teaspoon -- Oral - Honey Cup -- Oral - Nectar Teaspoon -- Oral - Nectar Cup --  Oral - Nectar Straw Lingual pumping;Delayed oral transit;Premature spillage;Decreased bolus cohesion;Weak lingual manipulation Oral - Thin Teaspoon Lingual pumping;Delayed oral transit;Premature spillage;Decreased bolus cohesion;Weak lingual manipulation Oral - Thin Cup -- Oral - Thin Straw Lingual pumping;Delayed oral transit;Premature spillage;Decreased bolus cohesion;Weak lingual manipulation Oral - Puree Lingual pumping;Delayed oral transit;Weak lingual manipulation Oral - Mech Soft Lingual pumping;Delayed oral transit;Impaired mastication;Weak lingual manipulation Oral - Regular -- Oral - Multi-Consistency -- Oral - Pill -- Oral Phase - Comment pt benefited from verbal cues at times to swallow  CHL IP PHARYNGEAL PHASE 12/30/2018 Pharyngeal Phase Impaired Pharyngeal- Pudding Teaspoon -- Pharyngeal -- Pharyngeal- Pudding Cup -- Pharyngeal -- Pharyngeal- Honey Teaspoon -- Pharyngeal -- Pharyngeal- Honey Cup -- Pharyngeal -- Pharyngeal- Nectar Teaspoon -- Pharyngeal -- Pharyngeal- Nectar Cup -- Pharyngeal -- Pharyngeal- Nectar Straw WFL Pharyngeal -- Pharyngeal- Thin Teaspoon WFL Pharyngeal -- Pharyngeal- Thin Cup -- Pharyngeal -- Pharyngeal- Thin Straw Penetration/Aspiration during swallow Pharyngeal Material enters airway, remains ABOVE vocal cords and not ejected out Pharyngeal- Puree WFL Pharyngeal -- Pharyngeal- Mechanical Soft WFL Pharyngeal -- Pharyngeal- Regular -- Pharyngeal -- Pharyngeal- Multi-consistency -- Pharyngeal -- Pharyngeal- Pill -- Pharyngeal -- Pharyngeal Comment challenged pt to sequential bolus swallows with thin without aspiration or significant penetration observed  CHL IP CERVICAL ESOPHAGEAL PHASE 12/30/2018 Cervical Esophageal Phase WFL- see esophagram  Pudding Teaspoon -- Pudding Cup -- Honey Teaspoon -- Honey Cup -- Nectar Teaspoon -- Nectar Cup -- Nectar Straw -- Thin Teaspoon -- Thin Cup -- Thin Straw -- Puree -- Mechanical Soft -- Regular -- Multi-consistency -- Pill -- Cervical  Esophageal Comment -- Donavan Burnet, MS [facility] SLP Acute Rehab Services Pager [phone] Office [phone] [name] 12/30/2018, 10:51 AM              Vas US Carotid  Result Date: 12/30/2018 Carotid Arterial Duplex Study Indications: TIA. Limitations: patient positioning, patient immobility, patient anatomy, high  bifurcation Performing Technologist: Chanda BusingGregory Collins RVT  Examination Guidelines: A complete evaluation includes B-mode imaging, spectral Doppler, color Doppler, and power Doppler as needed of all accessible portions of each vessel. Bilateral testing is considered an integral part of a complete examination. Limited examinations for reoccurring indications may be performed as noted.  Right Carotid Findings: +----------+--------+--------+--------+-----------------------+--------------+           PSV cm/sEDV cm/sStenosisDescribe               Comments       +----------+--------+--------+--------+-----------------------+--------------+ CCA Prox  72      14              smooth and heterogenoustortuous       +----------+--------+--------+--------+-----------------------+--------------+ CCA Distal89      19              smooth and heterogenous               +----------+--------+--------+--------+-----------------------+--------------+ ICA Prox  74      19                                                    +----------+--------+--------+--------+-----------------------+--------------+ ICA Distal74      19                                     tortuous       +----------+--------+--------+--------+-----------------------+--------------+ ECA                                                      Not visualized +----------+--------+--------+--------+-----------------------+--------------+ +----------+--------+-------+--------+-------------------+           PSV cm/sEDV cmsDescribeArm Pressure (mmHG)  +----------+--------+-------+--------+-------------------+ Subclavian190                                        +----------+--------+-------+--------+-------------------+ +---------+--------+---+--------+--+---------+ VertebralPSV cm/s114EDV cm/s27Antegrade +---------+--------+---+--------+--+---------+  Left Carotid Findings: +----------+--------+--------+--------+-----------------------+--------------+           PSV cm/sEDV cm/sStenosisDescribe               Comments       +----------+--------+--------+--------+-----------------------+--------------+ CCA Prox  91      16              smooth and heterogenous               +----------+--------+--------+--------+-----------------------+--------------+ CCA Distal85      20              smooth and heterogenous               +----------+--------+--------+--------+-----------------------+--------------+ ICA Prox  92      22              smooth and heterogenous               +----------+--------+--------+--------+-----------------------+--------------+ ICA Distal  Not visualized +----------+--------+--------+--------+-----------------------+--------------+ ECA       82      12                                     Not visualized +----------+--------+--------+--------+-----------------------+--------------+ +----------+--------+--------+--------+-------------------+ SubclavianPSV cm/sEDV cm/sDescribeArm Pressure (mmHG) +----------+--------+--------+--------+-------------------+           57                                          +----------+--------+--------+--------+-------------------+ +---------+--------+--+--------+--+---------+ VertebralPSV cm/s46EDV cm/s12Antegrade +---------+--------+--+--------+--+---------+  Summary: Right Carotid: Velocities in the right ICA are consistent with a 1-39% stenosis. Left Carotid: Velocities in the left ICA are  consistent with a 1-39% stenosis. Vertebrals: Bilateral vertebral arteries demonstrate antegrade flow. *See table(s) above for measurements and observations.  Electronically signed by Waverly Ferrari MD on 12/30/2018 at 5:06:36 PM.    Final    Dg Esophagus W Single Cm (sol Or Thin Ba)  Result Date: 12/30/2018 CLINICAL DATA:  85 year old female with dysphagia. Initial encounter. EXAM: ESOPHOGRAM/BARIUM SWALLOW TECHNIQUE: Single contrast examination was performed using  thin barium. FLUOROSCOPY TIME:  Fluoroscopy Time:  1 minutes and 12 seconds. Radiation Exposure Index: 18 mGy COMPARISON:  None. FINDINGS: Residual contrast in stomach and proximal small bowel from speech swallow study performed just prior to this exam. Exam was tailored to the patient. Patient was in a 30 degree upright position and imaging performed from frontal projection. Poor primary esophageal stripping wave. Tertiary wave contractions noted throughout the exam. Contrast would not traverse into the stomach despite dry swallowing, ingestion of small amount of water and placing the patient in 45 degree upright position. Patient will be kept in a 30 degree upright position for the next 4 hours. IMPRESSION: 1. Presbyesophagus with poor primary esophageal stripping wave and tertiary wave contractions. 2. Not able to evaluate the gastroesophageal junction as contrast would not traverse beyond this region despite dry swallowing, ingestion of water, change of patient position and delayed imaging. Electronically Signed   By: Lacy Duverney M.D.   On: 12/30/2018 10:00     I have reviewed the above imaging : Reviewed head CT, moderate volume loss and chronic ischemic changes.   ASSESSMENT AND PLAN  85 year old female with past medical history of CVA, SIADH admitted for pneumonia and hyponatremia with sudden onset altered mental status, lethargic state and facial twitching and eyelid twitching that resolved with Ativan and Keppra.  Patient is  back to her normal self.  ABG done at time of lethargy showed no evidence of hypoxia/hypercarbia.  I suspect most likely patient had a focal seizure.  Seizures unlikely from hyponatremia alone as sodium was 128 and likely a combination of old age, cortical atrophy in addition to hyponatremia and infection reducing her seizure threshold.    Possible focal seizures -Risk factors include old age, volume loss on CT and hyponatremia, pneumonia and being on antibiotics  -Continue Keppra 500 mg twice daily for now, if no recurrence may consider reducing dose or stopping it -Seizure precautions -Continue gradual correction of hyponatremia -Continue antibiotics   Sushanth Aroor [facility] Pager Number [phone]

## 2019-01-01 NOTE — Progress Notes (Signed)
PROGRESS NOTE    Tammy Fernandez  UJW:[id number] DOB: September 11, 1924 DOA: 12/27/2018 PCP: Myles Lipps, MD    Brief Narrative:  Patient is a 85 year old female with past medical history relevant for SIADH on demeclocycline, hypothyroidism, hypertension, hyperlipidemia, COPD on 2 L of oxygen, history of CVA, grade 2 diastolic dysfunction and severe pulmonary hypertension who was admitted to the hospital with acute metabolic encephalopathy due to hyponatremia and hypertension.  Patient has fluctuating hospital course.  Overnight on 12/31/2018 patient developed hypercapnia and unresponsiveness with transient drop in blood pressure.    Assessment & Plan:   Principal Problem:   Hyponatremia Active Problems:   GERD (gastroesophageal reflux disease)   History of CVA (cerebrovascular accident)   Anxiety state   Essential hypertension   Hypothyroidism   Chronic diastolic CHF (congestive heart failure) (HCC)   Severe pulmonary arterial systolic hypertension (HCC)   CAD (coronary artery disease)   Hypochloremic alkalosis   Acute metabolic encephalopathy   Hypertensive urgency   Cough  Acute on chronic hyponatremia/SIADH: Present on admission.  Patient was on demeclocycline and salt tablets.  Patient was treated with tolvaptan.  Seen and followed by nephrology.  Currently remains on maintenance isotonic fluid.  Sodium level is 131 today.  No indication for further tolvaptan.  She remains on maintenance fluid.  Monitor every 6 hours to ensure stabilization.  Acute on chronic hypoxic and hypercapnic respiratory failure: Patient is on 2 L oxygen at home.  Overnight her ABG showed pH 7.29 with CO2 of 113.  This was not treated with BiPAP as it was thought to be chronic and compensated.  Patient also had neurological event with twitching of eyelids and becoming unresponsive which is probably due to hypercapnia.  Will treat with BiPAP.  Patient may benefit with BiPAP at home.  It will not be possible to  do sleep study on her because of acute illnesses, however with her present blood gas, she may qualify for BiPAP.  I called and discussed case with pulmonology and they will see patient in consultation.  Acute metabolic encephalopathy , multifactorial: Altered mental status/somnolence/body movements: Presumed seizure and loaded with Keppra and she was started on maintenance Keppra.  Most likely due to hypercapnia.  Followed by neurology.  Will continue Keppra, when she has more clinical improvement, will gradually taper it off.  Left lower lobe pneumonia: Healthcare associated pneumonia.  Developed overnight.  Suspect aspiration.  Started on vancomycin and Zosyn last night that we will continue today.  If no positive cultures, will discontinue vancomycin tomorrow.  Aggressive chest physiotherapy and bronchodilator therapy. Patient probably has aspiration pneumonia.  She was seen by speech and currently on dysphagia 3 diet.  They suggested GI consult, evaluated by GI.  Patient is currently not stable enough to undergo EGD.  Hypothyroidism: Clinically euthyroid on current regimen.  Will continue.  Accelerated hypertension: blood pressures are uncontrolled.  Will resume all home medications.  Will keep patient on as needed IV and oral antihypertensives to control blood pressures to keep systolic blood pressure less than 180.  Will need further up titration up blood pressure medications.  History of stroke: Continue aspirin.  No evidence of new stroke.  Chronic diastolic heart failure: Currently euvolemic.  GERD: On PPI in the morning and Pepcid at night.  Will continue.    DVT prophylaxis: lovenox Code Status: full code Family Communication: Son [name] and multiple family members at the bedside. Disposition Plan: Unknown at this time.   Consultants:  Neurology,  Gastroenterology,  Pulmonary   Procedures:   None   Antimicrobials:   Vancomycin started 01/01/2019  Zosyn started  01/01/2019   Subjective: Patient was seen and examined.  Multiple family members at the bedside.  Significant events overnight happened, reviewed with patient's family.  Patient is currently confused, however awake. Overnight reportedly patient became more lethargic, her blood pressure dropped. Her CO2 was 113 and she had twitching movements of the eyes and mouth, started on seizure medications. Transient drop in blood pressure, however blood pressures elevated on minimal intervention. Chest x-ray showed new left lower lobe infiltrate, started on vancomycin and Zosyn.  Objective: Vitals:   01/01/19 1000 01/01/19 1100 01/01/19 1200 01/01/19 1300  BP: (!) 158/42 (!) 156/35 (!) 145/34 (!) 144/40  Pulse: 73 73 72 71  Resp: 18 (!) 27 (!) 25 (!) 27  Temp:   98.1 F (36.7 C)   TempSrc:   Axillary   SpO2: 100% 100% 100% 100%  Weight:      Height:        Intake/Output Summary (Last 24 hours) at 01/01/2019 1403 Last data filed at 01/01/2019 1100 Gross per 24 hour  Intake 1881.58 ml  Output 300 ml  Net 1581.58 ml   Filed Weights   12/28/18 0001  Weight: 78.8 kg    Examination:  General exam: Patient is sick looking, on mild respiratory distress.  She was on 3 L of oxygen. Respiratory system: Mostly conducted airway sounds.  Mild respiratory distress.  No other added sounds.   Cardiovascular system: S1 & S2 heard, RRR. No JVD, murmurs, rubs, gallops or clicks. No pedal edema. Gastrointestinal system: Abdomen is nondistended, soft and nontender. No organomegaly or masses felt. Normal bowel sounds heard. Central nervous system: Alert but not oriented.  She is occasionally appropriately answering, most of the time she is confused. Extremities: Symmetric 5 x 5 power. Skin: No rashes, lesions or ulcers Psychiatry: Judgement and insight appear normal. Mood & affect appropriate.  Anxious.    Data Reviewed: I have personally reviewed following labs and imaging studies  CBC: Recent Labs   Lab 12/27/18 1834 12/28/18 0559 12/29/18 0207 12/30/18 0548 12/31/18 0233 01/01/19 0021  WBC 7.7 10.2 13.3* 11.6* 9.2 11.3*  NEUTROABS 5.2  --   --   --   --  8.2*  HGB 11.2* 10.2* 9.8* 9.9* 10.5* 10.1*  HCT 36.6 33.0* 31.9* 33.0* 35.7* 34.8*  MCV 92.4 93.8 94.4 95.1 97.3 99.1  PLT 238 235 205 228 233 215   Basic Metabolic Panel: Recent Labs  Lab 12/27/18 2346  12/31/18 1428 12/31/18 1740 12/31/18 2202 01/01/19 0021 01/01/19 0736 01/01/19 1327  NA 121*   < > 129* 129* 129* 128* 130* 131*  K 3.7   < > 4.3 4.3 4.3 4.1  --  3.6  CL <65*   < > 70* 71* 73* 74*  --  78*  CO2 43*   < > 47* 50* 48* 48*  --  47*  GLUCOSE 85   < > 143* 165* 147* 121*  --  153*  BUN 19   < > 26* 24* 25* 25*  --  20  CREATININE 0.63   < > 0.76 0.76 0.83 0.90  --  0.74  CALCIUM 9.3   < > 10.0 10.2 9.8 9.7  --  9.0  MG 1.2*  --   --   --   --  2.2  --   --    < > =  values in this interval not displayed.   GFR: Estimated Creatinine Clearance: 43.6 mL/min (by C-G formula based on SCr of 0.74 mg/dL). Liver Function Tests: Recent Labs  Lab 01/01/19 0021  AST 43*  ALT 13  ALKPHOS 52  BILITOT 0.5  PROT 6.2*  ALBUMIN 2.9*   No results for input(s): LIPASE, AMYLASE in the last 168 hours. No results for input(s): AMMONIA in the last 168 hours. Coagulation Profile: No results for input(s): INR, PROTIME in the last 168 hours. Cardiac Enzymes: Recent Labs  Lab 01/01/19 0333 01/01/19 0908  TROPONINI <0.03 0.03*   BNP (last 3 results) No results for input(s): PROBNP in the last 8760 hours. HbA1C: No results for input(s): HGBA1C in the last 72 hours. CBG: Recent Labs  Lab 12/29/18 1623 12/31/18 2315  GLUCAP 174* 141*   Lipid Profile: No results for input(s): CHOL, HDL, LDLCALC, TRIG, CHOLHDL, LDLDIRECT in the last 72 hours. Thyroid Function Tests: No results for input(s): TSH, T4TOTAL, FREET4, T3FREE, THYROIDAB in the last 72 hours. Anemia Panel: No results for input(s): VITAMINB12,  FOLATE, FERRITIN, TIBC, IRON, RETICCTPCT in the last 72 hours. Sepsis Labs: Recent Labs  Lab 01/01/19 0004 01/01/19 0333  PROCALCITON  --  <0.10  LATICACIDVEN 1.0  --     Recent Results (from the past 240 hour(s))  MRSA PCR Screening     Status: None   Collection Time: 12/29/18  7:34 PM  Result Value Ref Range Status   MRSA by PCR NEGATIVE NEGATIVE Final    Comment:        The GeneXpert MRSA Assay (FDA approved for NASAL specimens only), is one component of a comprehensive MRSA colonization surveillance program. It is not intended to diagnose MRSA infection nor to guide or monitor treatment for MRSA infections. Performed at [facility], [address] [place], [place] [address]          Radiology Studies: Dg Chest 2 View  Result Date: 12/31/2018 CLINICAL DATA:  85 year old female with a history of acute hypoxic respiratory failure. EXAM: CHEST - 2 VIEW COMPARISON:  CT 12/27/2018, plain film 12/27/2018, 06/10/2018, 12/28/2017, 12/24/2017, CT 12/21/2017 FINDINGS: Significantly limited chest x-ray given the patient's positioning and body habitus. Overall, the cardiomediastinal silhouette is unchanged compared to the prior plain films. There is persistent elevation of the right hemidiaphragm. This is more evident on the lateral view. On the lateral view there is increasing opacity at the posterior aspect of the right hemidiaphragm, which corresponds to the finding on recent CT of consolidation/volume loss at the right lung base. No new large pleural effusion or pneumothorax. IMPRESSION: Limited plain film demonstrates persistent low lung volumes, crowded central vessels, and right hemidiaphragm elevation. Opacity at the right lung base corresponds to findings of right lower lobe consolidation/atelectasis on prior CT. Electronically Signed   By: Gilmer MorJaime  Wagner D.O.   On: 12/31/2018 13:51   Ct Head Wo Contrast  Result Date: 01/01/2019 CLINICAL DATA:  85 y/o  F;  unresponsive, hypotensive, disoriented. EXAM: CT HEAD WITHOUT CONTRAST TECHNIQUE: Contiguous axial images were obtained from the base of the skull through the vertex without intravenous contrast. COMPARISON:  12/29/2018 CT head. FINDINGS: Brain: No evidence of acute infarction, hemorrhage, hydrocephalus, extra-axial collection or mass lesion/mass effect. Stable 4 mm cerebellar tonsillar ectopia with rounded appearance of the cerebellar tonsils, probably normal variant. Stable chronic microvascular ischemic changes and volume loss of the brain. Vascular: Calcific atherosclerosis of the carotid siphons. No hyperdense vessel identified. Skull: Normal. Negative for  fracture or focal lesion. Sinuses/Orbits: No acute finding. Bilateral intra-ocular lens replacement. Other: None. IMPRESSION: 1. No acute intracranial abnormality. 2. Stable chronic microvascular ischemic changes and volume loss of the brain. Electronically Signed   By: Mitzi HansenLance  Furusawa-Stratton M.D.   On: 01/01/2019 02:37   Dg Chest Port 1 View  Result Date: 01/01/2019 CLINICAL DATA:  Respiratory distress EXAM: PORTABLE CHEST 1 VIEW COMPARISON:  12/31/2018, 12/29/2018 FINDINGS: Cardiomegaly with bilateral pleural effusions. Low lung volumes. Worsening airspace disease at the left base. Persistent airspace disease at the right base. Vascular congestion and probable pulmonary edema. Aortic atherosclerosis. Residual contrast within high-riding colon within the right upper quadrant. IMPRESSION: Low lung volumes. Worsening airspace disease at the left lung base, atelectasis versus pneumonia. Probable small pleural effusions. Continued atelectasis/consolidation at right base. Enlarged cardiomediastinal silhouette with vascular congestion and probable mild hazy pulmonary edema Electronically Signed   By: Jasmine PangKim  Fujinaga M.D.   On: 01/01/2019 02:11        Scheduled Meds: . amLODipine  10 mg Oral Daily  . aspirin EC  81 mg Oral Daily  . enoxaparin  (LOVENOX) injection  40 mg Subcutaneous Daily  . famotidine  20 mg Oral QHS  . feeding supplement  1 Container Oral TID BM  . fluticasone  1 spray Each Nare BID  . levothyroxine  50 mcg Oral Q0600  . loratadine  10 mg Oral Daily  . mouth rinse  15 mL Mouth Rinse BID  . pantoprazole  40 mg Oral Daily  . propranolol ER  60 mg Oral Daily   Continuous Infusions: . sodium chloride 100 mL/hr at 01/01/19 1100  . sodium chloride Stopped (01/01/19 1045)  . piperacillin-tazobactam (ZOSYN)  IV Stopped (01/01/19 1027)  . [START ON 01/02/2019] vancomycin       LOS: 5 days    Time spent: 35 minutes    Dorcas CarrowKuber Ada Woodbury, MD [facility] Pager [phone]  If 7PM-7AM, please contact night-coverage [email or web address] Password [email or web address] 01/01/2019, 2:03 PM

## 2019-01-01 NOTE — Procedures (Signed)
ELECTROENCEPHALOGRAM REPORT   Patient: Tammy Fernandez       Room #: 2774 EEG No. ID: [id number] Age: 85 y.o.        Sex: female Referring Physician: Ghimire Report Date:  01/01/2019        Interpreting Physician: Thana Farr  History: Tammy Fernandez is an 85 y.o. female with altered mental status and rhythmic twitching of eyes  Medications:  Vancomycin, Inderal, Zosyn, Synthroid, Protonix, Keppra, Flonase, Pepcid, ASA, Norvasc  Conditions of Recording:  This is a 21 channel routine scalp EEG performed with bipolar and monopolar montages arranged in accordance to the international 10/20 system of electrode placement. One channel was dedicated to EKG recording.  The patient is in the awake state.  Description:  The waking background activity consists of a low voltage, symmetrical, fairly well organized, 6-7 Hz theta activity, seen from the parieto-occipital and posterior temporal regions.  Low voltage fast activity, poorly organized, is seen anteriorly and is at times superimposed on more posterior regions.  A mixture of theta and alpha rhythms are seen from the central and temporal regions. The patient does not drowse or sleep. No epileptiform activity is noted. Hyperventilation and intermittent photic stimulation were not performed.   IMPRESSION: This is an abnormal EEG secondary to posterior background slowing.  This finding may be seen with a diffuse gray matter disturbance that is etiologically nonspecific, but may include a dementia, among other possibilities.  No epileptiform activity is noted.    Thana Farr, MD Neurology [phone] 01/01/2019, 10:07 AM

## 2019-01-01 NOTE — Consult Note (Signed)
NAME:  FELIX PRATT, MRN:  [id number], DOB:  December 16, 1923, LOS: 5 ADMISSION DATE:  12/27/2018, CONSULTATION DATE:  1/30 REFERRING MD: Sloan Leiter   , CHIEF COMPLAINT:  Hypercarbia    Brief History   85 year old female with multiple comorbidities listed below including: Seizure disorder, chronic CVA, chronic SIADH on demeclocycline.  Admitted with what was felt to be symptomatic hyponatremia, hypochloremic alkalosis, and altered sensorium.  She had been treated with gentle hydration, vaptan, and salt tabs.  Sodium has stabilized around 1/28 however during the early a.m. hours of 1/30 there was acute mental status change.  Arterial blood gas was obtained showing PCO2 of 113 bicarbonate 53 O2 of 139 pH of 7.29.  Pulmonary has been asked to assess for the pulmonary component of her worsening hypercarbia  History of present illness   89 yof admitted from home w/ cc: cough, leg edema and mild confusion. On presentation had: mild SOB but no CP, fever or chills. On admit was hypertensive, NaCHO3 was 44, and Na was 122. Was admitted w/ working dx of symptomatic SIADH and hypertensive urgency.  Initial urine studies: osmo: 471, Na 22, and serum osmo of 258. Also found to have chronic hypochloremic metabolic alk.  Nephrology was consulted on 1/29 to assist w/ work up of hyponatremia as Na had not improved after fluid restriction and 3 doses of tolvaprtan. As of 1/30 her Na is around 128 to 129 w/ working dx of volume depletion superimposed on low solute state vs SIADH. She developed worsening lethargy on 1/30, was more lethargic and neurology was consulted. CT head was neg. Na stable, there was concern about possible focal seizure (EEG was neg). As part of this work-up an abg was obtained. This showed: ph 7.29, pco2 113, po2 113, bicarb 53. Baseline PCO2 calculated to 80s. PCCM asked to see for her acute on chronic hypercarbia.   Past Medical History  Dysphagia, chronic hyponatremia (SIADH on Demeclocycline)), CHF,  chronic resp failure, diastolic HF, PH, CAD, prior CVA  Significant Hospital Events   1/25 admitted 1/29 nephrology consulted.  Most likely secondary to SIADH versus poor solute intake.  Now getting protein supplementation, mild sodium replacement, and close observation given concern about possible component of contraction alkalosis 1/30 pulmonary consulted due to a secondary primary respiratory acidosis superimposed on underlying metabolic alkalosis.  Consults:  Nephrology for hyponatremia Neurology for acute mental status change GI for dysphasia Pulmonary for acute hypercarbic respiratory failure  Procedures:  Not applicable  Significant Diagnostic Tests:   1/25: CT chest.  Elevated right hemidiaphragm with chronic atelectasis CT head 1/27: No acute intracranial abnormality chronic small white matter disease 1/30 CT head: Stable no changes compared to prior film 1/30: EEG negative for seizure Micro Data:    Antimicrobials:    Interim history/subjective:  Lethargic   Objective   Blood pressure (Abnormal) 144/40, pulse 71, temperature 98.1 F (36.7 C), temperature source Axillary, resp. rate (Abnormal) 27, height '5\' 2"'$  (1.575 m), weight 78.8 kg, SpO2 100 %.        Intake/Output Summary (Last 24 hours) at 01/01/2019 1328 Last data filed at 01/01/2019 1100 Gross per 24 hour  Intake 1881.58 ml  Output 300 ml  Net 1581.58 ml   Filed Weights   12/28/18 0001  Weight: 78.8 kg    Examination: General: elderly 85 year old female. Lying in bed confused and lethargic  HENT: MMM NCAT  Lungs: decreased lung volumes.  Cardiovascular: RRR Abdomen: soft not tender  Extremities: dependent edema  Neuro: awake, follows commands. Confused. Slurred speech  GU: cl yellow   Resolved Hospital Problem list     Assessment & Plan:   Acute respiratory acidosis (baseline CO2 looks to be in 80s), superimposed on underlying metabolic alkalosis  -She has no history of smoking, doubt  underlying obstructive lung disease.  Given her obesity, severe deconditioning, and generalized failure to thrive I would favor simply hypoventilation as her contributing factor to her respiratory acidosis Plan We will trial noninvasive positive pressure ventilation Treat underlying contraction alkalosis, improvement in sodium suggest possibly element of hypovolemia Repeat ABG after noninvasive positive pressure ventilation She has failure to thrive, she desperately needs palliative care and goals of care discussion with family  Hypoosmolar hyponatremia.  Nephrology following.  Differential diagnosis includes SIADH versus low solute diet.  Given failure to thrive, evidence of malnutrition I wonder more about low solute and poor intake at this point.  She does seem to be improving with gentle saline replacement Plan Continue therapy as outlined by nephrology  Possible aspiration pneumonia Portable x-ray personally reviewed From today 1/30: Worsening right greater than left airspace disease, probable bilateral effusions.  Left now worse in comparison to prior day. Plan Vancomycin and Zosyn per primary team Aspiration precautions  Acute metabolic encephalopathy Suspect possibly exacerbated by hypercarbia.  There was also concern about possible seizure but EEG negative at this point Plan Discontinue any sedating medications Treat hypercarbia Serial neuro exams  Severe dysphasia Plan Dysphasia precautions Additional recommendations per GI  Hypothyroidism Plan Continue Synthroid replacement   Hypertension Plan Continue current Rx  GERD Plan PPI and H2 blockade  History of stroke Plan Continue aspirin Best practice:  Diet: Dysphagia diet Pain/Anxiety/Delirium protocol (if indicated): Not applicable VAP protocol (if indicated): Not applicable DVT prophylaxis: Low molecular weight heparin GI prophylaxis: PPI and H2 blockade Glucose control: Not applicable Mobility: Advance  as tolerated Code Status: Full code Family Communication: Pending Disposition: Continue to monitor.  High risk for progressive respiratory failure..  Will trial noninvasive positive pressure ventilation she needs to have further goals of care discussion.  I think this may simply be failure to thrive  Labs   CBC: Recent Labs  Lab 12/27/18 1834 12/28/18 0559 12/29/18 0207 12/30/18 0548 12/31/18 0233 01/01/19 0021  WBC 7.7 10.2 13.3* 11.6* 9.2 11.3*  NEUTROABS 5.2  --   --   --   --  8.2*  HGB 11.2* 10.2* 9.8* 9.9* 10.5* 10.1*  HCT 36.6 33.0* 31.9* 33.0* 35.7* 34.8*  MCV 92.4 93.8 94.4 95.1 97.3 99.1  PLT 238 235 205 228 233 485    Basic Metabolic Panel: Recent Labs  Lab 12/27/18 2346  12/31/18 0233 12/31/18 1428 12/31/18 1740 12/31/18 2202 01/01/19 0021 01/01/19 0736  NA 121*   < > 128* 129* 129* 129* 128* 130*  K 3.7   < > 4.4 4.3 4.3 4.3 4.1  --   CL <65*   < > 71* 70* 71* 73* 74*  --   CO2 43*   < > 44* 47* 50* 48* 48*  --   GLUCOSE 85   < > 116* 143* 165* 147* 121*  --   BUN 19   < > 27* 26* 24* 25* 25*  --   CREATININE 0.63   < > 0.79 0.76 0.76 0.83 0.90  --   CALCIUM 9.3   < > 10.0 10.0 10.2 9.8 9.7  --   MG 1.2*  --   --   --   --   --  2.2  --    < > = values in this interval not displayed.   GFR: Estimated Creatinine Clearance: 38.8 mL/min (by C-G formula based on SCr of 0.9 mg/dL). Recent Labs  Lab 12/29/18 0207 12/30/18 0548 12/31/18 0233 01/01/19 0004 01/01/19 0021 01/01/19 0333  PROCALCITON  --   --   --   --   --  <0.10  WBC 13.3* 11.6* 9.2  --  11.3*  --   LATICACIDVEN  --   --   --  1.0  --   --     Liver Function Tests: Recent Labs  Lab 01/01/19 0021  AST 43*  ALT 13  ALKPHOS 52  BILITOT 0.5  PROT 6.2*  ALBUMIN 2.9*   No results for input(s): LIPASE, AMYLASE in the last 168 hours. No results for input(s): AMMONIA in the last 168 hours.  ABG    Component Value Date/Time   PHART 7.294 (L) 12/31/2018 2350   PCO2ART 113 (HH)  12/31/2018 2350   PO2ART 157 (H) 12/31/2018 2350   HCO3 53.1 (H) 12/31/2018 2350   TCO2 45.0 12/08/2015 1900   O2SAT 98.7 12/31/2018 2350     Coagulation Profile: No results for input(s): INR, PROTIME in the last 168 hours.  Cardiac Enzymes: Recent Labs  Lab 01/01/19 0333 01/01/19 0908  TROPONINI <0.03 0.03*    HbA1C: Hgb A1c MFr Bld  Date/Time Value Ref Range Status  10/31/2015 06:07 AM 7.2 (H) 4.8 - 5.6 % Final    Comment:    (NOTE)         Pre-diabetes: 5.7 - 6.4         Diabetes: >6.4         Glycemic control for adults with diabetes: <7.0   01/12/2014 06:06 AM 6.6 (H) <5.7 % Final    Comment:    (NOTE)                                                                       According to the ADA Clinical Practice Recommendations for 2011, when HbA1c is used as a screening test:  >=6.5%   Diagnostic of Diabetes Mellitus           (if abnormal result is confirmed) 5.7-6.4%   Increased risk of developing Diabetes Mellitus References:Diagnosis and Classification of Diabetes Mellitus,Diabetes DDUK,0254,27(CWCBJ 1):S62-S69 and Standards of Medical Care in         Diabetes - 2011,Diabetes Care,2011,34 (Suppl 1):S11-S61.    CBG: Recent Labs  Lab 12/29/18 1623 12/31/18 2315  GLUCAP 174* 141*    Review of Systems:   Unable   Past Medical History  She,  has a past medical history of Anxiety, Chronic lower back pain, Constipation, CVA (cerebral vascular accident) ([place]) (2015), Depression, Diastolic CHF, chronic ([place]), Fatty liver, GERD (gastroesophageal reflux disease), Hyperlipidemia, Hypertension, Hyponatremia, Hypothyroidism, unspecified, Internal hemorrhoid, NSTEMI (non-ST elevated myocardial infarction) ([place]) (08/28/2016), Palpitations, PFO with atrial septal aneurysm, Pneumonia (2016), and Severe pulmonary arterial systolic hypertension ([place]) (12/25/2017).   Surgical History    Past Surgical History:  Procedure Laterality Date  . ABDOMINAL HYSTERECTOMY    .  CATARACT EXTRACTION W/ INTRAOCULAR LENS  IMPLANT, BILATERAL Bilateral   . CESAREAN SECTION    . TEE WITHOUT  CARDIOVERSION N/A 01/19/2014   Procedure: TRANSESOPHAGEAL ECHOCARDIOGRAM (TEE);  Surgeon: Larey Dresser, MD;  Location: [facility];  Service: Cardiovascular;  Laterality: N/A;  [name]  . VAGINAL HYSTERECTOMY       Social History   reports that she has never smoked. She has quit using smokeless tobacco.  Her smokeless tobacco use included snuff. She reports that she does not drink alcohol or use drugs.   Family History   Her family history includes Colon cancer in her cousin and another family member; Diabetes in an other family member; Healthy in her daughter and son; Heart disease in an other family member; Hypertension in her father; Stroke in her father.   Allergies Allergies  Allergen Reactions  . Crestor [Rosuvastatin Calcium] Other (See Comments)    Muscle Aches  . Keflex [Cephalexin] Other (See Comments)    dizziness  . Peanut Butter Flavor Hives  . Sulfa Antibiotics Hives  . Cephalexin Other (See Comments)    dizziness  . Sulfa Drugs Cross Reactors Rash     Home Medications  Prior to Admission medications   Medication Sig Start Date End Date Taking? Authorizing Provider  albuterol (PROVENTIL) (2.5 MG/3ML) 0.083% nebulizer solution Take 3 mLs (2.5 mg total) by nebulization every 4 (four) hours as needed for wheezing or shortness of breath. 10/08/18  Yes Icard, Octavio Graves, DO  ALPRAZolam (XANAX) 1 MG tablet TAKE 1 TABLET BY MOUTH TWICE DAILY Patient taking differently: Take 0.25 mg by mouth 3 (three) times daily.  10/02/18  Yes Rutherford Guys, MD  Aspirin 81 MG EC tablet Take 81 mg by mouth daily.   Yes [provider]  cetirizine (ZYRTEC) 10 MG tablet Take 10 mg by mouth daily.   Yes [provider]  demeclocycline (DECLOMYCIN) 300 MG tablet TK 1/2 T PO BID 12/04/18  Yes Rutherford Guys, MD  docusate sodium (COLACE) 100 MG capsule Take 100 mg by  mouth daily as needed for mild constipation.   Yes [provider]  fluticasone (FLONASE) 50 MCG/ACT nasal spray Place 1 spray into both nostrils 2 (two) times daily. 11/13/18  Yes Rutherford Guys, MD  Glycerin, Laxative, (ADULT SUPPOSITORY RE) Place 1 Dose rectally as needed (costipation).   Yes [provider]  levothyroxine (SYNTHROID, LEVOTHROID) 50 MCG tablet Take 1 tablet (50 mcg total) by mouth daily before breakfast. 09/16/18  Yes Rutherford Guys, MD  lisinopril (PRINIVIL,ZESTRIL) 5 MG tablet Take 2 tablets (10 mg total) by mouth daily. Patient taking differently: Take 5 mg by mouth daily.  06/25/18  Yes Mesner, Corene Cornea, MD  meclizine (ANTIVERT) 12.5 MG tablet Take 1 tablet by mouth as needed for dizziness 12/20/18  Yes Stallings, Zoe A, MD  olopatadine (PATANOL) 0.1 % ophthalmic solution Place 1 drop into both eyes daily as needed for allergies. 03/25/18  Yes Rutherford Guys, MD  omeprazole (PRILOSEC) 20 MG capsule Take 20 mg by mouth every evening.  12/09/18  Yes [provider]  propranolol ER (INDERAL LA) 60 MG 24 hr capsule TAKE 1 CAPSULE BY MOUTH DAILY 11/13/18  Yes Rutherford Guys, MD  ranitidine (ZANTAC) 300 MG tablet Take 300 mg by mouth at bedtime.  10/12/18  Yes [provider]  amLODipine (NORVASC) 5 MG tablet Take 1 tablet (5 mg total) by mouth daily. Patient not taking: Reported on 12/20/2018 07/08/18   Rutherford Guys, MD  azelastine (ASTELIN) 0.1 % nasal spray Place 1 spray into both nostrils 2 (two) times daily.  Use in each nostril as directed Patient not taking: Reported on 12/27/2018 05/29/18   Rutherford Guys, MD  famotidine (PEPCID) 20 MG tablet Take 1 tablet (20 mg total) by mouth daily. Take 1 tablet at Bedtime daily. Patient not taking: Reported on 12/20/2018 12/16/18   Rutherford Guys, MD  fluconazole (DIFLUCAN) 150 MG tablet Take 1 tablet (150 mg total) by mouth every 3 (three) days. Patient not taking: Reported on 12/27/2018 12/20/18    Forrest Moron, MD  pantoprazole (PROTONIX) 40 MG tablet Take 1 tablet (40 mg total) by mouth daily. Patient not taking: Reported on 12/27/2018 12/04/18   Rutherford Guys, MD     Critical care time: 32 min      Erick Colace ACNP-BC [place] Pager # [phone] OR # [phone] if no answer

## 2019-01-01 NOTE — Progress Notes (Signed)
EEG Completed; Results Pending  

## 2019-01-01 NOTE — Progress Notes (Signed)
Pharmacy Antibiotic Note  Tammy Fernandez is a 85 y.o. female admitted on 12/27/2018 with pneumonia.  Pharmacy has been consulted for Vancomycin dosing.  Plan: Vancomycin 1500mg  iv x1, then Vancomycin 1250 mg IV Q36 hrs. Goal AUC 400-550. Expected AUC: 480 SCr used: 0.9   Height: 5\' 2"  (157.5 cm) Weight: 173 lb 11.6 oz (78.8 kg) IBW/kg (Calculated) : 50.1  Temp (24hrs), Avg:98.3 F (36.8 C), Min:97.9 F (36.6 C), Max:98.8 F (37.1 C)  Recent Labs  Lab 12/28/18 0559 12/29/18 0207 12/30/18 0548 12/31/18 0233 12/31/18 1428 12/31/18 1740 12/31/18 2202 01/01/19 0004 01/01/19 0021  WBC 10.2 13.3* 11.6* 9.2  --   --   --   --  11.3*  CREATININE 0.59 0.96 0.87 0.79 0.76 0.76 0.83  --  0.90  LATICACIDVEN  --   --   --   --   --   --   --  1.0  --     Estimated Creatinine Clearance: 38.8 mL/min (by C-G formula based on SCr of 0.9 mg/dL).    Allergies  Allergen Reactions  . Crestor [Rosuvastatin Calcium] Other (See Comments)    Muscle Aches  . Keflex [Cephalexin] Other (See Comments)    dizziness  . Peanut Butter Flavor Hives  . Sulfa Antibiotics Hives  . Cephalexin Other (See Comments)    dizziness  . Sulfa Drugs Cross Reactors Rash    Antimicrobials this admission: Vancomycin 01/01/2019 >> Zosyn 12/31/2018 >>   Dose adjustments this admission: -  Microbiology results: -  Thank you for allowing pharmacy to be a part of this patient's care.  Aleene Davidson Crowford 01/01/2019 4:34 AM

## 2019-01-01 NOTE — Progress Notes (Signed)
PT Cancellation Note  Patient Details Name: Tammy Fernandez MRN: [id number] DOB: February 05, 1924   Cancelled Treatment:    Reason Eval/Treat Not Completed: Pt back on bipap. Will check back another day.    Rebeca Alert, PT Acute Rehabilitation Services Pager: [phone] Office: [phone]

## 2019-01-01 NOTE — Progress Notes (Signed)
I have seen the patient.,  Please refer to full consultation note by Dr. Laurence Slate with one caveat.  Her CO2 by ABG at the time of her lethargy was 113, and the twitching that was seen with subtle facial twitching.  I suspect that both her lethargy and twitching could be explained by her hypercarbia.  Her improvement, therefore will be due to repeated stimulation and increased respiratory rate rather than the Ativan which was given around the same time.   Her EEG does not reveal any evidence of seizure predisposition and she has not had any episodes in the past to suggest seizure predisposition and therefore I would not continue antiepileptic therapy at this time.  She does appear mildly delirious, but is able to identify her son at the bedside and has a history of delirium with mild physiological insults in the past including an episode during which I saw her back in 2017 for a UTI.  At this time, I would not favor any further neurological work-up, and would expect her mental status to gradually improve as her medical status improves.  Please call with any further neurological questions or concerns, as neurology will be available on an as-needed basis moving forward.   Tammy Slot, MD [facility] [phone]  If 7pm- 7am, please page neurology on call as listed in AMION.

## 2019-01-01 NOTE — Progress Notes (Signed)
I went in the room to check on pt. She was less responsive than she was only 45 minutes prior. Pt would only squint her eyes to sternal rub. ABG was drawn and findings were reported to NP. He advised me to continue to monitor her

## 2019-01-01 NOTE — Progress Notes (Signed)
    Progress Note   Subjective  Asleep in ICU bed, arouses with incomplete sentences with apparent confusion No obvious discomfort Has had EEG and neurology consultation   Objective  Vital signs in last 24 hours: Temp:  [97.6 F (36.4 C)-98.8 F (37.1 C)] 98 F (36.7 C) (01/30 0800) Pulse Rate:  [72-84] 73 (01/30 1100) Resp:  [16-27] 27 (01/30 1100) BP: (127-183)/(28-61) 156/35 (01/30 1100) SpO2:  [94 %-100 %] 100 % (01/30 1100) Last BM Date: 12/26/18 Gen: Elderly, confused lying in bed, mostly sleeping but awakes with incomprehensible speech HEENT: anicteric, op dry CV: RRR Pulm: CTA b/l Abd: soft, NT/ND, +BS throughout Ext: no c/c/e   Intake/Output from previous day: 01/29 0701 - 01/30 0700 In: 1337.8 [I.V.:669; IV Piggyback:668.8] Out: 300 [Urine:300] Intake/Output this shift: Total I/O In: 543.8 [P.O.:60; I.V.:437.8; IV Piggyback:46] Out: -   Lab Results: Recent Labs    12/30/18 0548 12/31/18 0233 01/01/19 0021  WBC 11.6* 9.2 11.3*  HGB 9.9* 10.5* 10.1*  HCT 33.0* 35.7* 34.8*  PLT 228 233 215   BMET Recent Labs    12/31/18 1740 12/31/18 2202 01/01/19 0021 01/01/19 0736  NA 129* 129* 128* 130*  K 4.3 4.3 4.1  --   CL 71* 73* 74*  --   CO2 50* 48* 48*  --   GLUCOSE 165* 147* 121*  --   BUN 24* 25* 25*  --   CREATININE 0.76 0.83 0.90  --   CALCIUM 10.2 9.8 9.7  --    LFT Recent Labs    01/01/19 0021  PROT 6.2*  ALBUMIN 2.9*  AST 43*  ALT 13  ALKPHOS 52  BILITOT 0.5      Assessment & Recommendations  85 year old female with history of SIADH, hypothyroidism, hypertension, hyperlipidemia, COPD, pulmonary hypertension here with hyponatremia/metabolic encephalopathy and report of dysphagia with abnormal though poor quality esophagram  1. Dysphagia --patient remains confused and likely combination of metabolic derangement and hypercarbia.  Renal and neurology have seen the patient.  Definitive component of presbyesophagus possibly worsened by  reflux disease.  Cannot exclude structural lesions such as stricture, less likely mass.  At this point given level of confusion, I do not recommend endoscopy. Endoscopy could be considered if her mental status returns to baseline and she has improvement in her respiratory status/metabolic derangement.  GI will remain available, please call with questions or for consideration of endoscopy once clinical improvement      LOS: 5 days   Beverley Fiedler  01/01/2019, 11:45 AM

## 2019-01-01 NOTE — Progress Notes (Signed)
SLP Cancellation Note  Patient Details Name: TAJMA KIELTY MRN: [id number] DOB: June 26, 1924   Cancelled treatment:       Reason Eval/Treat Not Completed: Other (comment)(pt on bipap with respiratory issues, will continue efforts)  Donavan Burnet, MS [facility] SLP Acute Rehab Services Pager [phone] Office [phone]   [name] 01/01/2019, 3:28 PM

## 2019-01-01 NOTE — Progress Notes (Signed)
Admit: 12/27/2018 LOS: 5  41F with Asymptomatic Hypotonic Hyponatremia; volume status is difficult to ascertain, does not appear to be overtly hypervolemic; I wonder if she is hypovolemic related to poor oral intake.. Patient has a worsened but chronic hypochloremic metabolic alkalosis  Subjective:  . Lethargy and confusion overnight . Head CT neg, EEG neg . Na level stable 130, HCO3 48 . More acidotic, PaCO2 increased . Sons in room . Is taking some boost  01/29 0701 - 01/30 0700 In: 1337.8 [I.V.:669; IV Piggyback:668.8] Out: 300 [Urine:300]  Filed Weights   12/28/18 0001  Weight: 78.8 kg    Scheduled Meds: . amLODipine  10 mg Oral Daily  . aspirin EC  81 mg Oral Daily  . enoxaparin (LOVENOX) injection  40 mg Subcutaneous Daily  . famotidine  20 mg Oral QHS  . feeding supplement  1 Container Oral TID BM  . fluticasone  1 spray Each Nare BID  . levothyroxine  50 mcg Oral Q0600  . loratadine  10 mg Oral Daily  . mouth rinse  15 mL Mouth Rinse BID  . pantoprazole  40 mg Oral Daily  . propranolol ER  60 mg Oral Daily   Continuous Infusions: . sodium chloride 100 mL/hr at 01/01/19 1100  . sodium chloride Stopped (01/01/19 1045)  . piperacillin-tazobactam (ZOSYN)  IV Stopped (01/01/19 1027)  . [START ON 01/02/2019] vancomycin     PRN Meds:.acetaminophen **OR** acetaminophen, albuterol, dextromethorphan-guaiFENesin, docusate sodium, hydrALAZINE, LORazepam, meclizine, olopatadine, ondansetron **OR** ondansetron (ZOFRAN) IV, senna-docusate  Current Labs: reviewed    Physical Exam:  Blood pressure (!) 156/35, pulse 73, temperature 98 F (36.7 C), temperature source Axillary, resp. rate (!) 27, height 5\' 2"  (1.575 m), weight 78.8 kg, SpO2 100 %. GEN: Elderly female, chronically ill-appearing, softly spoken ENT: NCAT EYES: EOMI CV: RRR, normal S1 and S2 PULM: Diminished in the bases, no wheezing, no rales ABD: Soft, nontender SKIN: No rashes or lesions EXT: No significant  lower extremity edema  A/P 1. Asymptomatic Hypotonic Hyponatremia 1. DDx is recurrent SIADH vs poor solute intake 1. U studies most consistent with SIADH but history suggests poor solute intake 2. TSH was WNL,  3. Has improved with Vaptan and salt tabs; not surprising or helpful necessarily  4. No need for 3% saline here 5. I slightly favor low solute intake; esp as per below 1. ContBOost TID 2. Cont 13400mL/hr NS 3. f/u BMP this PM, cont q6h BMP 4. Stop NS if SNa starts to fall 5. Should not use vaptan here 2. Hypochloremic Metabolc Alkalosis 1. Not having overt GI losses 2. Not on diruetics as outpatient 3. Is this all contraction alkalosis? 4. PH on ABG shows near full correction, so likley this is mixed with primary resp acidosis as well, COPD? Family denies pt was a smoker 5. Cont volume expansion and see how she does 3. Dysphagia, likely poor solute intake, GI following 4. Debility / FTT / Advanced Age; probably would benefit from palliative involvement; i'm not sure how much recovery we can expect   Sabra Heckyan Ladon Vandenberghe MD 01/01/2019, 12:17 PM  Recent Labs  Lab 12/31/18 1740 12/31/18 2202 01/01/19 0021 01/01/19 0736  NA 129* 129* 128* 130*  K 4.3 4.3 4.1  --   CL 71* 73* 74*  --   CO2 50* 48* 48*  --   GLUCOSE 165* 147* 121*  --   BUN 24* 25* 25*  --   CREATININE 0.76 0.83 0.90  --   CALCIUM 10.2 9.8  9.7  --    Recent Labs  Lab 12/27/18 1834  12/30/18 0548 12/31/18 0233 01/01/19 0021  WBC 7.7   < > 11.6* 9.2 11.3*  NEUTROABS 5.2  --   --   --  8.2*  HGB 11.2*   < > 9.9* 10.5* 10.1*  HCT 36.6   < > 33.0* 35.7* 34.8*  MCV 92.4   < > 95.1 97.3 99.1  PLT 238   < > 228 233 215   < > = values in this interval not displayed.

## 2019-01-01 NOTE — Progress Notes (Signed)
ABG delayed due to RT in CT with vent pt. ABG results given to RN.

## 2019-01-02 ENCOUNTER — Inpatient Hospital Stay (HOSPITAL_COMMUNITY): Payer: Medicare Other

## 2019-01-02 DIAGNOSIS — Z7189 Other specified counseling: Secondary | ICD-10-CM

## 2019-01-02 DIAGNOSIS — Z515 Encounter for palliative care: Secondary | ICD-10-CM

## 2019-01-02 LAB — BLOOD GAS, ARTERIAL
Acid-Base Excess: 19.5 mmol/L — ABNORMAL HIGH (ref 0.0–2.0)
Bicarbonate: 46.9 mmol/L — ABNORMAL HIGH (ref 20.0–28.0)
DRAWN BY: 422461
Delivery systems: POSITIVE
Expiratory PAP: 5
FIO2: 30
Inspiratory PAP: 12
Mode: POSITIVE
O2 Saturation: 94.8 %
PO2 ART: 75.3 mmHg — AB (ref 83.0–108.0)
Patient temperature: 98.4
RATE: 8 resp/min
pCO2 arterial: 75.6 mmHg (ref 32.0–48.0)
pH, Arterial: 7.409 (ref 7.350–7.450)

## 2019-01-02 LAB — BASIC METABOLIC PANEL
Anion gap: 9 (ref 5–15)
Anion gap: 7 (ref 5–15)
Anion gap: 13 (ref 5–15)
Anion gap: 14 (ref 5–15)
BUN: 16 mg/dL (ref 8–23)
BUN: 17 mg/dL (ref 8–23)
BUN: 16 mg/dL (ref 8–23)
BUN: 17 mg/dL (ref 8–23)
CO2: 38 mmol/L — AB (ref 22–32)
CO2: 39 mmol/L — ABNORMAL HIGH (ref 22–32)
CO2: 42 mmol/L — ABNORMAL HIGH (ref 22–32)
CO2: 44 mmol/L — ABNORMAL HIGH (ref 22–32)
CREATININE: 0.7 mg/dL (ref 0.44–1.00)
Calcium: 9.2 mg/dL (ref 8.9–10.3)
Calcium: 8.9 mg/dL (ref 8.9–10.3)
Calcium: 9.3 mg/dL (ref 8.9–10.3)
Calcium: 9.3 mg/dL (ref 8.9–10.3)
Chloride: 80 mmol/L — ABNORMAL LOW (ref 98–111)
Chloride: 82 mmol/L — ABNORMAL LOW (ref 98–111)
Chloride: 81 mmol/L — ABNORMAL LOW (ref 98–111)
Chloride: 81 mmol/L — ABNORMAL LOW (ref 98–111)
Creatinine, Ser: 0.61 mg/dL (ref 0.44–1.00)
Creatinine, Ser: 0.7 mg/dL (ref 0.44–1.00)
Creatinine, Ser: 0.73 mg/dL (ref 0.44–1.00)
GFR calc Af Amer: >60 mL/min (ref >60)
GFR calc Af Amer: >60 mL/min (ref >60)
GFR calc Af Amer: >60 mL/min (ref >60)
GFR calc Af Amer: >60 mL/min (ref >60)
GFR calc non Af Amer: >60 mL/min (ref >60)
GFR calc non Af Amer: >60 mL/min (ref >60)
GFR calc non Af Amer: >60 mL/min (ref >60)
GFR calc non Af Amer: >60 mL/min (ref >60)
Glucose, Bld: 100 mg/dL — ABNORMAL HIGH (ref 70–99)
Glucose, Bld: 83 mg/dL (ref 70–99)
Glucose, Bld: 86 mg/dL (ref 70–99)
Glucose, Bld: 76 mg/dL (ref 70–99)
Potassium: 3.5 mmol/L (ref 3.5–5.1)
Potassium: 3.4 mmol/L — ABNORMAL LOW (ref 3.5–5.1)
Potassium: 3.3 mmol/L — ABNORMAL LOW (ref 3.5–5.1)
Potassium: 3.4 mmol/L — ABNORMAL LOW (ref 3.5–5.1)
SODIUM: 133 mmol/L — AB (ref 135–145)
SODIUM: 133 mmol/L — AB (ref 135–145)
Sodium: 131 mmol/L — ABNORMAL LOW (ref 135–145)
Sodium: 133 mmol/L — ABNORMAL LOW (ref 135–145)

## 2019-01-02 LAB — PROCALCITONIN: Procalcitonin: <0.1 ng/mL

## 2019-01-02 NOTE — Telephone Encounter (Signed)
Patient son called in and requested Interim referral was sent on 12/23/2018

## 2019-01-02 NOTE — Progress Notes (Signed)
PROGRESS NOTE  Tammy BeachRosa L Flinders ZHY:[id number] DOB: 1924/02/22 DOA: 12/27/2018 PCP: Myles LippsSantiago, Irma M, MD   LOS: 6 days   Brief narrative: Patient is a 85 year old female with past medical history relevant for SIADH on demeclocycline, hypothyroidism, hypertension, hyperlipidemia, COPD on 2 L of oxygen, history of CVA, grade 2 diastolic dysfunction and severe pulmonary hypertension who was admitted to the hospital with acute metabolic encephalopathy due to hyponatremia and hypertension.  Patient hospital course has been complicated by acute on chronic hypoxic and hypercapnic respiratory failure as well.  Assessment/Plan:  Principal Problem:   Hyponatremia Active Problems:   GERD (gastroesophageal reflux disease)   History of CVA (cerebrovascular accident)   Anxiety state   Acute on chronic respiratory failure with hypercapnia (HCC)   Essential hypertension   Hypothyroidism   Chronic diastolic CHF (congestive heart failure) (HCC)   Severe pulmonary arterial systolic hypertension (HCC)   CAD (coronary artery disease)   Hypochloremic alkalosis   Acute metabolic encephalopathy   Hypertensive urgency   Cough  Acute on chronic hyponatremia/SIADH: Present on admission.  Prior to admission, patient was on demeclocycline and salt tablets.  In the hospital, he was treated with tolvaptan. Nephrology consult called.  Currently patient is on normal saline, BMP being monitored every 6 hours for stabilization.  Acute on chronic hypoxic and hypercapnic respiratory failure: Patient was on 2 L oxygen at home.  She has chronic respiratory acidosis, and hypochloremic metabolic alkalosis secondary to COPD.  Baseline PCO2 level around 80.  ABGs have subsequently showed persistent hypercapnia with adequate metabolic compensation.  She was placed on BiPAP overnight.  Pulmonary consult was obtained.  Only discussed with family regarding CODE STATUS.    Acute metabolic encephalopathy , multifactorial: Altered  mental status/somnolence/body movements: Presumed seizure and loaded with Keppra and she was started on maintenance Keppra.  Keppra discontinued per neurology recommendation.  Left lower lobe pneumonia: Suspect recurrent aspiration pneumonia.  Continue IV Zosyn.  Stop vancomycin since there is no evidence of MRSA infection.  Continue agressive chest physiotherapy and bronchodilator therapy. She was seen by speech and currently on dysphagia 3 diet.  They suggested GI consult, evaluated by GI.  Patient is currently not stable enough to undergo EGD.  Hypothyroidism: Clinically euthyroid on current regimen.  Will continue.  Accelerated hypertension: blood pressures running uncontrolled.  Oral blood pressure medicines are ordered but patient has not been getting it because of aspiration tendency.  Currently on IV hydralazine as needed for elevated blood pressure over 160.   History of stroke: Continue aspirin.  No evidence of new stroke.  Chronic diastolic heart failure: Currently euvolemic.  GERD: On PPI in the morning and Pepcid at night.  Will continue.  VTE Prophylaxis: Lovenox    Code Status: Full Code   Disposition Plan: Needs Cortistat discussed with family.  Unable to do therapy evaluation because of altered mentation.  Antibiotics: Antibiotics Given (last 72 hours)    Date/Time Action Medication Dose Rate   12/31/18 1505 New Bag/Given   piperacillin-tazobactam (ZOSYN) IVPB 3.375 g 3.375 g 12.5 mL/hr   12/31/18 2153 New Bag/Given   piperacillin-tazobactam (ZOSYN) IVPB 3.375 g 3.375 g 12.5 mL/hr   01/01/19 0348 New Bag/Given   vancomycin (VANCOCIN) 1,500 mg in sodium chloride 0.9 % 500 mL IVPB 1,500 mg 250 mL/hr   01/01/19 [id number] New Bag/Given   piperacillin-tazobactam (ZOSYN) IVPB 3.375 g 3.375 g 12.5 mL/hr   01/01/19 1431 New Bag/Given   piperacillin-tazobactam (ZOSYN) IVPB 3.375 g 3.375 g 12.5 mL/hr  01/01/19 2102 New Bag/Given   piperacillin-tazobactam (ZOSYN) IVPB 3.375  g 3.375 g 12.5 mL/hr   01/02/19 0514 New Bag/Given   piperacillin-tazobactam (ZOSYN) IVPB 3.375 g 3.375 g 12.5 mL/hr      Continuous Infusions:  . sodium chloride Stopped (01/02/19 0514)  . sodium chloride Stopped (01/01/19 1434)  . piperacillin-tazobactam (ZOSYN)  IV 12.5 mL/hr at 01/02/19 0910    Scheduled Meds: . amLODipine  10 mg Oral Daily  . aspirin EC  81 mg Oral Daily  . chlorhexidine  15 mL Mouth Rinse BID  . enoxaparin (LOVENOX) injection  40 mg Subcutaneous Daily  . famotidine  20 mg Oral QHS  . feeding supplement  1 Container Oral TID BM  . fluticasone  1 spray Each Nare BID  . levothyroxine  50 mcg Oral Q0600  . loratadine  10 mg Oral Daily  . mouth rinse  15 mL Mouth Rinse BID  . pantoprazole  40 mg Oral Daily  . propranolol ER  60 mg Oral Daily    PRN meds: acetaminophen **OR** acetaminophen, albuterol, dextromethorphan-guaiFENesin, docusate sodium, hydrALAZINE, LORazepam, meclizine, olopatadine, ondansetron **OR** ondansetron (ZOFRAN) IV, senna-docusate   Consultants: GI Pulmonology Nephrology  Procedures: EGD  Subjective: Patient was seen and examined this morning.  Awake.  Elderly African-American female. Uttering nonsensical words and sentences.  Not oriented to time, place or person.  Objective: Vitals:   01/02/19 0800 01/02/19 0855  BP: (!) 157/40 (!) 167/54  Pulse: 87 87  Resp: (!) 23 (!) 25  Temp: 98.6 F (37 C)   SpO2: 99% 100%    Intake/Output Summary (Last 24 hours) at 01/02/2019 0955 Last data filed at 01/02/2019 0910 Gross per 24 hour  Intake 2102.51 ml  Output 1700 ml  Net 402.51 ml   Filed Weights   12/28/18 0001  Weight: 78.8 kg   Body mass index is 31.77 kg/m.   Physical Exam: GENERAL: Elderly African-American female HENT: No scleral pallor or icterus. Pupils equally reactive to light. Oral mucosa is moist NECK: is supple, no palpable thyroid enlargement. CHEST: Clear to auscultation. No crackles or wheezes. Non  tender on palpation. Diminished breath sounds bilaterally. CVS: S1 and S2 heard, no murmur. Regular rate and rhythm. No pericardial rub. ABDOMEN: Soft, non-tender, bowel sounds are present. No palpable hepato-splenomegaly. EXTREMITIES: No edema. CNS: Awake, not oriented to time, place or person SKIN: warm and dry without rashes.  Data Review: I have personally reviewed the laboratory data and studies available.  Lorin Glass, MD  [facility] 01/02/2019

## 2019-01-02 NOTE — Telephone Encounter (Signed)
[facility] could not assist pt so is there anywhere else you can send a referral?

## 2019-01-02 NOTE — Progress Notes (Signed)
Patient does not follow commands; held PO medications tonight.

## 2019-01-02 NOTE — Progress Notes (Signed)
NAME:  Tammy Fernandez, MRN:  [id number], DOB:  10/16/1924, LOS: 6 ADMISSION DATE:  12/27/2018, CONSULTATION DATE:  1/30 REFERRING MD: Jerral Ralph   , CHIEF COMPLAINT:  Hypercarbia    Brief History   85 year old female with multiple comorbidities listed below including: Seizure disorder, chronic CVA, chronic SIADH on demeclocycline.  Admitted with what was felt to be symptomatic hyponatremia, hypochloremic alkalosis, and altered sensorium.  She had been treated with gentle hydration, vaptan, and salt tabs.  Sodium has stabilized around 1/28 however during the early a.m. hours of 1/30 there was acute mental status change.  Arterial blood gas was obtained showing PCO2 of 113 bicarbonate 53 O2 of 139 pH of 7.29.  Pulmonary has been asked to assess for the pulmonary component of her worsening hypercarbia  Past Medical History  Dysphagia, chronic hyponatremia (SIADH on Demeclocycline)), CHF, chronic resp failure, diastolic HF, PH, CAD, prior CVA  Significant Hospital Events   1/25 admitted 1/29 nephrology consulted.  Most likely secondary to SIADH versus poor solute intake.  Now getting protein supplementation, mild sodium replacement, and close observation given concern about possible component of contraction alkalosis 1/30 pulmonary consulted due to a secondary primary respiratory acidosis superimposed on underlying metabolic alkalosis. Placed on NIPPV w/ improvement in PCO2 from >100 to 72 to 75  Consults:  Nephrology for hyponatremia Neurology for acute mental status change GI for dysphasia Pulmonary for acute hypercarbic respiratory failure  Procedures:  Not applicable  Significant Diagnostic Tests:   1/25: CT chest.  Elevated right hemidiaphragm with chronic atelectasis CT head 1/27: No acute intracranial abnormality chronic small white matter disease 1/30 CT head: Stable no changes compared to prior film 1/30: EEG negative for seizure Micro Data:    Antimicrobials:    Interim  history/subjective:  She fluctuates between being lethargic and then agitated and restless yelling out.  She is confused this morning.  Objective   Blood pressure (Abnormal) 131/34, pulse 66, temperature 98.4 F (36.9 C), temperature source Axillary, resp. rate 17, height 5\' 2"  (1.575 m), weight 78.8 kg, SpO2 96 %.    FiO2 (%):  [30 %] 30 %   Intake/Output Summary (Last 24 hours) at 01/02/2019 9381 Last data filed at 01/02/2019 0545 Gross per 24 hour  Intake 2059.77 ml  Output 1700 ml  Net 359.77 ml   Filed Weights   12/28/18 0001  Weight: 78.8 kg    Examination: General: Is chronically ill-appearing 85 year old debilitated female she is currently confused and restless in bed HEENT normocephalic atraumatic mucous membranes are moist no neck vein distention appreciated Pulmonary: Diminished bases no accessory use Cardiac: Regular rate and rhythm Abdomen: Soft, not tender, no organomegaly Extremities: Dependent edema anasarca, pulses palpable Neuro: Awake, hollering out, confused, moves extremities but she is extremely weak GU: Clear yellow  Resolved Hospital Problem list     Assessment & Plan:   Acute respiratory acidosis (baseline CO2 looks to be in 80s), superimposed on underlying metabolic alkalosis  -She has no history of smoking, doubt underlying obstructive lung disease.  Given her obesity, severe deconditioning, and generalized failure to thrive: favor simply hypoventilation & ATX as the cause of her hypercarbia. Also consider element of non-cardiogenic pulmonary edema +/- effusion Plan Maximize nutritional status BIPAP at HS Goals of care discussion ->needs palliative care consult  Possible aspiration pneumonia Portable chest x-ray personally reviewed from today:Improving right greater than left aeration Plan Cont asp precautions Day 2 vanc/zosyn Chest x-ray PRN     Hypoosmolar hyponatremia.  Nephrology  following.  Differential diagnosis includes SIADH versus  low solute diet.  Given failure to thrive, evidence of malnutrition I wonder more about low solute and poor intake at this point.  She does seem to be improving with gentle saline replacement->now up to 133 Plan Cont rx as outlined by nephro    Acute metabolic encephalopathy I think this is multifactorial, possibly exacerbated by hypercarbia but also there was question about possible seizure.  May be a contributing factor as well as just simply ICU delirium. Also suspect infection seizure but EEG negative at this point Plan No sedating meds Supportive care HS BIPAP as tolerated  Severe dysphasia Plan Dysphagia precautions Additional eval/recs per GI  Hypothyroidism Plan Cont synthroid    Hypertension Plan Cont current rx Avoid HCTZ w/ h/o SIADH  GERD Plan ppi and H2B  History of stroke Plan Asa  Best practice:  Diet: Dysphagia diet Pain/Anxiety/Delirium protocol (if indicated): Not applicable VAP protocol (if indicated): Not applicable DVT prophylaxis: Low molecular weight heparin GI prophylaxis: PPI and H2 blockade Glucose control: Not applicable Mobility: Advance as tolerated Code Status: Full code Family Communication: Pending Disposition: Keep in stepdown unit.  She is chronically critically ill.  I think generally were dealing with just advanced age deconditioning and malnutrition, we may be able to give her more time if she will continue to tolerate BiPAP at night.  I am planning on meeting with the family a little later today, we need to establish goals of care I do not think she is a good candidate for ACLS, or oral intubation.  Simonne Martinet ACNP-BC [facility] Pulmonary/Critical Care Pager # [phone] OR # [phone] if no answer

## 2019-01-02 NOTE — Progress Notes (Signed)
Admit: 12/27/2018 LOS: 6  15F with Asymptomatic Hypotonic Hyponatremia; volume status is difficult to ascertain, does not appear to be overtly hypervolemic; I wonder if she is hypovolemic related to poor oral intake.. Patient has a worsened but chronic hypochloremic metabolic alkalosis  Subjective:  Marland Kitchen Delirious, mumbling, not interactive . Na level improved to 133 . Serum HCO3 39, improved . Using some BiPAP; PaCO2 improved . Decent UOP . Family in room, updated and I expressed my concerns that given her age and debility I doubt we can reverse her current path.    01/30 0701 - 01/31 0700 In: 2059.8 [P.O.:60; I.V.:1860.3; IV Piggyback:139.5] Out: 1700 [Urine:1700]  Filed Weights   12/28/18 0001  Weight: 78.8 kg    Scheduled Meds: . amLODipine  10 mg Oral Daily  . aspirin EC  81 mg Oral Daily  . chlorhexidine  15 mL Mouth Rinse BID  . enoxaparin (LOVENOX) injection  40 mg Subcutaneous Daily  . famotidine  20 mg Oral QHS  . feeding supplement  1 Container Oral TID BM  . fluticasone  1 spray Each Nare BID  . levothyroxine  50 mcg Oral Q0600  . loratadine  10 mg Oral Daily  . mouth rinse  15 mL Mouth Rinse BID  . pantoprazole  40 mg Oral Daily  . propranolol ER  60 mg Oral Daily   Continuous Infusions: . sodium chloride Stopped (01/02/19 1124)  . sodium chloride Stopped (01/01/19 1434)  . piperacillin-tazobactam (ZOSYN)  IV Stopped (01/02/19 0914)   PRN Meds:.acetaminophen **OR** acetaminophen, albuterol, dextromethorphan-guaiFENesin, docusate sodium, hydrALAZINE, meclizine, olopatadine, ondansetron **OR** ondansetron (ZOFRAN) IV, senna-docusate  Current Labs: reviewed    Physical Exam:  Blood pressure (!) 148/35, pulse 80, temperature 98.6 F (37 C), temperature source Axillary, resp. rate (!) 23, height 5\' 2"  (1.575 m), weight 78.8 kg, SpO2 100 %. GEN: Elderly female, chronically ill-appearing, mumbling ENT: NCAT EYES: EOMI CV: RRR, normal S1 and S2 PULM: Diminished  in the bases, no wheezing, no rales ABD: Soft, nontender SKIN: No rashes or lesions EXT: No significant lower extremity edema NEURO: delirious  A/P 1. Asymptomatic Hypotonic Hyponatremia 1. DDx is poor solute intake or SIAHD, former probably was the case here 1. ContBOost TID 2. Reduce to 51mL NS / h for now given no PO 2. Hypochloremic Metabolc Alkalosis 1. Seems mostly it is a contraction alkalosis 2. Improving 3. Dysphagia, likely poor solute intake, GI following 4. Debility / FTT / Advanced Age; probably would benefit from palliative involvement; i'm not sure how much recovery we can expect  No further suggestions. COnt to to maintain hydration and support nutrition.  Agree with palliative involvement. Will sign off ffor now, call with any questions or concerns.    Sabra Heck MD 01/02/2019, 2:27 PM  Recent Labs  Lab 01/02/19 0034 01/02/19 0641 01/02/19 1227  NA 131* 133* 133*  K 3.5 3.4* 3.3*  CL 80* 82* 81*  CO2 42* 44* 39*  GLUCOSE 100* 83 86  BUN 17 16 16   CREATININE 0.61 0.70 0.70  CALCIUM 9.2 8.9 9.3   Recent Labs  Lab 12/27/18 1834  12/30/18 0548 12/31/18 0233 01/01/19 0021  WBC 7.7   < > 11.6* 9.2 11.3*  NEUTROABS 5.2  --   --   --  8.2*  HGB 11.2*   < > 9.9* 10.5* 10.1*  HCT 36.6   < > 33.0* 35.7* 34.8*  MCV 92.4   < > 95.1 97.3 99.1  PLT 238   < >  228 233 215   < > = values in this interval not displayed.

## 2019-01-02 NOTE — Progress Notes (Signed)
Rt took pt off BIPAP and placed on 1 LPM [place]. No distress noted at this time.

## 2019-01-02 NOTE — Progress Notes (Signed)
PT Cancellation Note  Patient Details Name: Tammy Fernandez MRN: [id number] DOB: 10-Sep-1924   Cancelled Treatment:     PT deferred this date.  Pt continues on bi-pap.  Will follow.   Dawn Convery 01/02/2019, 6:39 AM

## 2019-01-03 LAB — BASIC METABOLIC PANEL
Anion gap: 15 (ref 5–15)
BUN: 18 mg/dL (ref 8–23)
CO2: 36 mmol/L — AB (ref 22–32)
Calcium: 9.3 mg/dL (ref 8.9–10.3)
Chloride: 84 mmol/L — ABNORMAL LOW (ref 98–111)
Creatinine, Ser: 0.72 mg/dL (ref 0.44–1.00)
GFR calc Af Amer: >60 mL/min (ref >60)
GFR calc non Af Amer: >60 mL/min (ref >60)
GLUCOSE: 67 mg/dL — AB (ref 70–99)
Potassium: 3.4 mmol/L — ABNORMAL LOW (ref 3.5–5.1)
Sodium: 135 mmol/L (ref 135–145)

## 2019-01-03 LAB — CBC
HCT: 32 % — ABNORMAL LOW (ref 36.0–46.0)
Hemoglobin: 9.6 g/dL — ABNORMAL LOW (ref 12.0–15.0)
MCH: 28.6 pg (ref 26.0–34.0)
MCHC: 30 g/dL (ref 30.0–36.0)
MCV: 95.2 fL (ref 80.0–100.0)
Platelets: 231 10*3/uL (ref 150–400)
RBC: 3.36 MIL/uL — ABNORMAL LOW (ref 3.87–5.11)
RDW: 13.3 % (ref 11.5–15.5)
WBC: 9.1 10*3/uL (ref 4.0–10.5)
nRBC: 0 % (ref 0.0–0.2)

## 2019-01-03 LAB — PROCALCITONIN: Procalcitonin: <0.1 ng/mL

## 2019-01-03 NOTE — Consult Note (Signed)
Consultation Note Date: 01/03/2019   Patient Name: Tammy Fernandez  DOB: [birth year]  MRN: [id number]  Age / Sex: 85 y.o., female  PCP: Rutherford Guys, MD Referring Physician: Terrilee Croak, MD  Reason for Consultation: Establishing goals of care  HPI/Patient Profile: 85 y.o. female  with past medical history of SIADH on demeclocycline, hypothyroidism, hypertension, hyperlipidemia, COPD, CVA, diastolic dysfunction, and severe pulmonary hypertension admitted on 12/27/2018 with acute metabolic encephalopathy due to hyponatremia and hypertension with chronic hypoxic and hypercapnic respiratory failure, recurrent aspiration, and left lower lobe pneumonia.  Palliative consulted for goals of care  Clinical Assessment and Goals of Care: I saw and examined Ms. Dillie today.  She was awake, but was not really able to participate in conversation.  She would talk to herself with repeating on certain phrases but did not meaningfully participate in conversation.  I met at the bedside with her son, [name].    I introduced palliative care as specialized medical care for people living with serious illness. It focuses on providing relief from the symptoms and stress of a serious illness. The goal is to improve quality of life for both the patient and the family.  Her son reports that the family bases their faith in God and that He will bring healing to his mother.  He relays that she is the [place] of their family and that "she can't die because she is the one that holds everything together."  He relays a very close knit family with strong belief in God's healing.  He is able to acknowledge that she is 85 years old with multiple medical conditions, however, he reports that it "is not her time yet."  We had a long talk regarding her strong faith and how she continues to try to share this with others.  Prior to this hospitalization,  she was arranging to start a Bible study in her home as she is no longer able to get to church as often as she would like to.  I attempted to discuss with him regarding advanced directives and CODE STATUS and he reports again that family is relying on God to "take her when it is her time."  We had very superficial conversation as to what this may look like if continued plan for aggressive interventions in the event of cardiac or respiratory arrest, however, he was not emotionally able to acknowledge this possibility or have discussion about outcomes.  Primary decision-maker: Avanell Shackleton (son/HCPOA per family report)   SUMMARY OF RECOMMENDATIONS   - Family remains heavily invested in plan for full scope treatment including full code in the event of cardiac or respiratory arrest.  I do not think this is likely to change in the near future based on my conversation with her son today. - Palliative to continue to follow intermittently to continue to support family, build rapport, and progress conversation regarding long-term goals of care as family is emotionally able to do so.  Code Status/Advance Care Planning:  Full code  Palliative Prophylaxis:  Aspiration and Delirium Protocol  Additional Recommendations (Limitations, Scope, Preferences):  Full Scope Treatment  Psycho-social/Spiritual:   Desire for further Chaplaincy support: Did not address today  Additional Recommendations: Caregiving  Support/Resources  Prognosis:   Guarded  Discharge Planning: To Be Determined      Primary Diagnoses: Present on Admission: . Anxiety state . Chronic diastolic CHF (congestive heart failure) ([place]) . Essential hypertension . GERD (gastroesophageal reflux disease) . Hypothyroidism . CAD (coronary artery disease) . Hyponatremia . Hypochloremic alkalosis . Acute metabolic encephalopathy . Hypertensive urgency . Cough . Severe pulmonary arterial systolic hypertension ([place])   I have  reviewed the medical record, interviewed the patient and family, and examined the patient. The following aspects are pertinent.  Past Medical History:  Diagnosis Date  . Anxiety   . Chronic lower back pain   . Constipation    h/o fecal disimpaction on 2017  . CVA (cerebral vascular accident) ([place]) 2015   neg workup, MRI small acute lacunar infarcts. ASA only  . Depression   . Diastolic CHF, chronic (HCC)    grade I, most recent echo Jan 2019  . Fatty liver   . GERD (gastroesophageal reflux disease)   . Hyperlipidemia   . Hypertension   . Hyponatremia    multiple episodes with encephalopathy, renal thinks SIADH  . Hypothyroidism, unspecified   . Internal hemorrhoid   . NSTEMI (non-ST elevated myocardial infarction) ([place]) 08/28/2016  . Palpitations   . PFO with atrial septal aneurysm    TTE 2015  . Pneumonia 2016  . Severe pulmonary arterial systolic hypertension ([place]) 12/25/2017   per echo, on 1L oxgen via [place]   Social History   Socioeconomic History  . Marital status: Widowed    Spouse name: Not on file  . Number of children: 7  . Years of education: Not on file  . Highest education level: Not on file  Occupational History  . Not on file  Social Needs  . Financial resource strain: Not on file  . Food insecurity:    Worry: Not on file    Inability: Not on file  . Transportation needs:    Medical: Not on file    Non-medical: Not on file  Tobacco Use  . Smoking status: Never Smoker  . Smokeless tobacco: Former Systems developer    Types: Snuff  . Tobacco comment: "used snuff when I was 19"  Substance and Sexual Activity  . Alcohol use: No  . Drug use: No  . Sexual activity: Not Currently  Lifestyle  . Physical activity:    Days per week: Not on file    Minutes per session: Not on file  . Stress: Not on file  Relationships  . Social connections:    Talks on phone: Not on file    Gets together: Not on file    Attends religious service: Not on file    Active member of  club or organization: Not on file    Attends meetings of clubs or organizations: Not on file    Relationship status: Not on file  Other Topics Concern  . Not on file  Social History Narrative   ** Merged History Encounter **       Patient lives at home with her son         Family History  Problem Relation Age of Onset  . Stroke Father   . Hypertension Father   . Colon cancer Cousin   . Colon cancer Other  Aunt  . Diabetes Other        aunt and cousins  . Heart disease Other        cousins  . Healthy Daughter   . Healthy Son    Scheduled Meds: . amLODipine  10 mg Oral Daily  . aspirin EC  81 mg Oral Daily  . chlorhexidine  15 mL Mouth Rinse BID  . enoxaparin (LOVENOX) injection  40 mg Subcutaneous Daily  . famotidine  20 mg Oral QHS  . feeding supplement  1 Container Oral TID BM  . fluticasone  1 spray Each Nare BID  . levothyroxine  50 mcg Oral Q0600  . loratadine  10 mg Oral Daily  . mouth rinse  15 mL Mouth Rinse BID  . pantoprazole  40 mg Oral Daily  . propranolol ER  60 mg Oral Daily   Continuous Infusions: . sodium chloride 50 mL/hr at 01/03/19 0654  . sodium chloride Stopped (01/03/19 0651)  . piperacillin-tazobactam (ZOSYN)  IV 12.5 mL/hr at 01/03/19 0654   PRN Meds:.acetaminophen **OR** acetaminophen, albuterol, dextromethorphan-guaiFENesin, docusate sodium, hydrALAZINE, meclizine, olopatadine, ondansetron **OR** ondansetron (ZOFRAN) IV, senna-docusate Medications Prior to Admission:  Prior to Admission medications   Medication Sig Start Date End Date Taking? Authorizing Provider  albuterol (PROVENTIL) (2.5 MG/3ML) 0.083% nebulizer solution Take 3 mLs (2.5 mg total) by nebulization every 4 (four) hours as needed for wheezing or shortness of breath. 10/08/18  Yes Icard, Octavio Graves, DO  ALPRAZolam (XANAX) 1 MG tablet TAKE 1 TABLET BY MOUTH TWICE DAILY Patient taking differently: Take 0.25 mg by mouth 3 (three) times daily.  10/02/18  Yes Rutherford Guys,  MD  Aspirin 81 MG EC tablet Take 81 mg by mouth daily.   Yes [provider]  cetirizine (ZYRTEC) 10 MG tablet Take 10 mg by mouth daily.   Yes [provider]  demeclocycline (DECLOMYCIN) 300 MG tablet TK 1/2 T PO BID 12/04/18  Yes Rutherford Guys, MD  docusate sodium (COLACE) 100 MG capsule Take 100 mg by mouth daily as needed for mild constipation.   Yes [provider]  fluticasone (FLONASE) 50 MCG/ACT nasal spray Place 1 spray into both nostrils 2 (two) times daily. 11/13/18  Yes Rutherford Guys, MD  Glycerin, Laxative, (ADULT SUPPOSITORY RE) Place 1 Dose rectally as needed (costipation).   Yes [provider]  levothyroxine (SYNTHROID, LEVOTHROID) 50 MCG tablet Take 1 tablet (50 mcg total) by mouth daily before breakfast. 09/16/18  Yes Rutherford Guys, MD  lisinopril (PRINIVIL,ZESTRIL) 5 MG tablet Take 2 tablets (10 mg total) by mouth daily. Patient taking differently: Take 5 mg by mouth daily.  06/25/18  Yes Mesner, Corene Cornea, MD  meclizine (ANTIVERT) 12.5 MG tablet Take 1 tablet by mouth as needed for dizziness 12/20/18  Yes Stallings, Zoe A, MD  olopatadine (PATANOL) 0.1 % ophthalmic solution Place 1 drop into both eyes daily as needed for allergies. 03/25/18  Yes Rutherford Guys, MD  omeprazole (PRILOSEC) 20 MG capsule Take 20 mg by mouth every evening.  12/09/18  Yes [provider]  propranolol ER (INDERAL LA) 60 MG 24 hr capsule TAKE 1 CAPSULE BY MOUTH DAILY 11/13/18  Yes Rutherford Guys, MD  ranitidine (ZANTAC) 300 MG tablet Take 300 mg by mouth at bedtime.  10/12/18  Yes [provider]  amLODipine (NORVASC) 5 MG tablet Take 1 tablet (5 mg total) by mouth daily. Patient not taking: Reported on 12/20/2018 07/08/18   Rutherford Guys, MD  azelastine (ASTELIN) 0.1 % nasal spray Place 1 spray into both nostrils 2 (two) times daily. Use in each nostril as directed Patient not taking: Reported on 12/27/2018 05/29/18   Rutherford Guys, MD   famotidine (PEPCID) 20 MG tablet Take 1 tablet (20 mg total) by mouth daily. Take 1 tablet at Bedtime daily. Patient not taking: Reported on 12/20/2018 12/16/18   Rutherford Guys, MD  fluconazole (DIFLUCAN) 150 MG tablet Take 1 tablet (150 mg total) by mouth every 3 (three) days. Patient not taking: Reported on 12/27/2018 12/20/18   Forrest Moron, MD  pantoprazole (PROTONIX) 40 MG tablet Take 1 tablet (40 mg total) by mouth daily. Patient not taking: Reported on 12/27/2018 12/04/18   Rutherford Guys, MD   Allergies  Allergen Reactions  . Crestor [Rosuvastatin Calcium] Other (See Comments)    Muscle Aches  . Keflex [Cephalexin] Other (See Comments)    dizziness  . Peanut Butter Flavor Hives  . Sulfa Antibiotics Hives  . Cephalexin Other (See Comments)    dizziness  . Sulfa Drugs Cross Reactors Rash   Review of Systems Unable to obtain  Physical Exam  General: Intermittently alert, in no acute distress.  HEENT: No bruits, no goiter Heart: Regular rate and rhythm. No murmur appreciated. Lungs: Decreased air movement, clear Abdomen: Soft, nontender, nondistended, positive bowel sounds.  Ext: No significant edema Skin: Warm and dry  Vital Signs: BP (!) 140/43   Pulse 93   Temp 98 F (36.7 C) (Oral)   Resp (!) 21   Ht 5' 2"  (1.575 m)   Wt 78.8 kg   SpO2 94%   BMI 31.77 kg/m  Pain Scale: PAINAD   Pain Score: Asleep   SpO2: SpO2: 94 % O2 Device:SpO2: 94 % O2 Flow Rate: .O2 Flow Rate (L/min): 2 L/min  IO: Intake/output summary:   Intake/Output Summary (Last 24 hours) at 01/03/2019 0945 Last data filed at 01/03/2019 0654 Gross per 24 hour  Intake 1064.38 ml  Output 2900 ml  Net -1835.62 ml    LBM: Last BM Date: 12/26/18 Baseline Weight: Weight: 78.8 kg Most recent weight: Weight: 78.8 kg     Palliative Assessment/Data:   Flowsheet Rows     Most Recent Value  Intake Tab  Referral Department  Hospitalist  Unit at Time of Referral  ICU  Palliative Care Primary  Diagnosis  Nephrology  Date Notified  01/02/19  Palliative Care Type  Return patient Palliative Care  Reason for referral  Clarify Goals of Care  Date of Admission  12/29/18  Date first seen by Palliative Care  01/02/19  # of days Palliative referral response time  0 Day(s)  # of days IP prior to Palliative referral  4  Clinical Assessment  Palliative Performance Scale Score  20%  Psychosocial & Spiritual Assessment  Palliative Care Outcomes  Patient/Family meeting held?  Yes  Who was at the meeting?  Son, [name]  Palliative Care Outcomes  Clarified goals of care      Time In: 1820 Time Out:1935 Time Total: 59 Greater than 50%  of this time was spent counseling and coordinating care related to the above assessment and plan.  Signed by: Micheline Rough, MD   Please contact Palliative Medicine Team phone at [phone] for questions and concerns.  For individual provider: See Shea Evans

## 2019-01-03 NOTE — Progress Notes (Signed)
I met with patient son, [name], this evening.  He is clear in family desire for aggressive interventions and FULL CODE status.  Full consult note to follow.  Micheline Rough, MD [place] Team [phone]

## 2019-01-03 NOTE — Progress Notes (Signed)
PROGRESS NOTE  Tammy Fernandez HKF:[id number] DOB: [birth year] DOA: 12/27/2018 PCP: Myles Lipps, MD   LOS: 7 days   Brief narrative: Patient is a 85 year old female with past medical history relevant for SIADH on demeclocycline, hypothyroidism, hypertension, hyperlipidemia, COPD on 2 L of oxygen, history of CVA, grade 2 diastolic dysfunction and severe pulmonary hypertension who was admitted to the hospital with acute metabolic encephalopathy due to hyponatremia and hypertension. Patient hospital course has been complicated by acute on chronic hypoxic and hypercapnic respiratory failure as well.  Assessment/Plan:  Principal Problem:   Hyponatremia Active Problems:   GERD (gastroesophageal reflux disease)   History of CVA (cerebrovascular accident)   Anxiety state   Acute on chronic respiratory failure with hypercapnia (HCC)   Essential hypertension   Hypothyroidism   Chronic diastolic CHF (congestive heart failure) (HCC)   Severe pulmonary arterial systolic hypertension (HCC)   CAD (coronary artery disease)   Hypochloremic alkalosis   Acute metabolic encephalopathy   Hypertensive urgency   Cough  Acute on chronic hyponatremia/SIADH: Present on admission.  Currently in normal saline.  Sodium level improving, 135.Marland Kitchen   Acute on chronic hypoxic and hypercapnic respiratory failure:Patient was on 2 L oxygen at home.  She has chronic respiratory acidosis, and hypochloremic metabolic alkalosissecondary to COPD.  Baseline PCO2 level around 80.  ABGs have subsequently showed persistent hypercapnia with adequate metabolic compensation. Patient remains on oxygen via nasal cannula at 4 to 5 L/min at this time.  Acute metabolic encephalopathy,multifactorial:Altered mental status/somnolence/body movements: \ Patient was moaning and repeating herself yesterday for the whole day.  Today patient seems calm.  Family is at bedside and mentions that she was able to have a conversation with them  today.  Left lower lobe pneumonia:Suspect recurrent aspiration pneumonia.  Continue IV Zosyn. Continue agressive chest physiotherapy and bronchodilator therapy.She was seen by speech and currently on dysphagia 3 diet. They suggested GI consult, evaluated by GI. Patient is currently not stable enough to undergo EGD.  Hypothyroidism:Clinically euthyroid on current regimen.   Accelerated hypertension:blood pressures running uncontrolled.  Oral blood pressure medicines are ordered but patient has not been getting it because of aspiration tendency.  Currently on IV hydralazine as needed for elevated blood pressure over 160.   History of stroke:Continue aspirin. No evidence of new stroke.  Chronic diastolic heart failure:Currently euvolemic.  GERD: On PPI in the morning and Pepcid at night. Will continue.  Advanced directives - palliative care consult appreciated.  Family wants aggressive treatment at this time.  Will get physical therapy involved.  Patient probably needs SNF placement.  VTE Prophylaxis: Lovenox Code Status: Full Code  Disposition Plan: Family wants aggressive treatment at this time.  Will get physical therapy involved.  Patient probably needs SNF placement.  Antibiotics: Antibiotics Given (last 72 hours)    Date/Time Action Medication Dose Rate   12/31/18 1505 New Bag/Given   piperacillin-tazobactam (ZOSYN) IVPB 3.375 g 3.375 g 12.5 mL/hr   12/31/18 2153 New Bag/Given   piperacillin-tazobactam (ZOSYN) IVPB 3.375 g 3.375 g 12.5 mL/hr   01/01/19 0348 New Bag/Given   vancomycin (VANCOCIN) 1,500 mg in sodium chloride 0.9 % 500 mL IVPB 1,500 mg 250 mL/hr   01/01/19 9574 New Bag/Given   piperacillin-tazobactam (ZOSYN) IVPB 3.375 g 3.375 g 12.5 mL/hr   01/01/19 1431 New Bag/Given   piperacillin-tazobactam (ZOSYN) IVPB 3.375 g 3.375 g 12.5 mL/hr   01/01/19 2102 New Bag/Given   piperacillin-tazobactam (ZOSYN) IVPB 3.375 g 3.375 g 12.5 mL/hr   01/02/19  5638 New  Bag/Given   piperacillin-tazobactam (ZOSYN) IVPB 3.375 g 3.375 g 12.5 mL/hr   01/02/19 1527 New Bag/Given   piperacillin-tazobactam (ZOSYN) IVPB 3.375 g 3.375 g 12.5 mL/hr   01/02/19 2130 New Bag/Given   piperacillin-tazobactam (ZOSYN) IVPB 3.375 g 3.375 g 12.5 mL/hr   01/03/19 0651 New Bag/Given   piperacillin-tazobactam (ZOSYN) IVPB 3.375 g 3.375 g 12.5 mL/hr      Continuous Infusions:  . sodium chloride 50 mL/hr at 01/03/19 1000  . sodium chloride Stopped (01/03/19 0651)  . piperacillin-tazobactam (ZOSYN)  IV Stopped (01/03/19 1051)    Scheduled Meds: . amLODipine  10 mg Oral Daily  . aspirin EC  81 mg Oral Daily  . chlorhexidine  15 mL Mouth Rinse BID  . enoxaparin (LOVENOX) injection  40 mg Subcutaneous Daily  . famotidine  20 mg Oral QHS  . feeding supplement  1 Container Oral TID BM  . fluticasone  1 spray Each Nare BID  . levothyroxine  50 mcg Oral Q0600  . loratadine  10 mg Oral Daily  . mouth rinse  15 mL Mouth Rinse BID  . pantoprazole  40 mg Oral Daily  . propranolol ER  60 mg Oral Daily    PRN meds: acetaminophen **OR** acetaminophen, albuterol, dextromethorphan-guaiFENesin, docusate sodium, hydrALAZINE, meclizine, olopatadine, ondansetron **OR** ondansetron (ZOFRAN) IV, senna-docusate   Subjective: Patient was seen and examined this morning.  Elderly African-American female.  Tries to open eyes on verbal command.  Not in distress.  Unable to answer other questions.  Objective: Vitals:   01/03/19 1000 01/03/19 1120  BP: (!) 136/27   Pulse: 96   Resp: (!) 21   Temp:  98.7 F (37.1 C)  SpO2: 97%     Intake/Output Summary (Last 24 hours) at 01/03/2019 1318 Last data filed at 01/03/2019 1000 Gross per 24 hour  Intake 1093.13 ml  Output 2900 ml  Net -1806.87 ml   Filed Weights   12/28/18 0001  Weight: 78.8 kg   Body mass index is 31.77 kg/m.   Physical Exam: GENERAL: Elderly African-American female, not in distress HENT: No scleral pallor or  icterus. Pupils equally reactive to light. Oral mucosa is moist NECK: is supple, no palpable thyroid enlargement. CHEST: Clear to auscultation. No crackles or wheezes. Non tender on palpation. Diminished breath sounds bilaterally. CVS: S1 and S2 heard, no murmur. Regular rate and rhythm. No pericardial rub. ABDOMEN: Soft, non-tender, bowel sounds are present. No palpable hepato-splenomegaly. EXTREMITIES: No edema. CNS: Opens eyes on verbal command, not able to answer to orientation questions to me. SKIN: warm and dry without rashes.  Data Review: I have personally reviewed the laboratory data and studies available.  Lorin Glass, MD  [facility] 01/03/2019

## 2019-01-04 LAB — BASIC METABOLIC PANEL
Anion gap: 10 (ref 5–15)
BUN: 15 mg/dL (ref 8–23)
CO2: 26 mmol/L (ref 22–32)
CREATININE: 0.48 mg/dL (ref 0.44–1.00)
Calcium: 7.2 mg/dL — ABNORMAL LOW (ref 8.9–10.3)
Chloride: 106 mmol/L (ref 98–111)
GFR calc non Af Amer: >60 mL/min (ref >60)
Glucose, Bld: 123 mg/dL — ABNORMAL HIGH (ref 70–99)
Potassium: 3.7 mmol/L (ref 3.5–5.1)
Sodium: 142 mmol/L (ref 135–145)

## 2019-01-04 MED ORDER — ALPRAZOLAM 0.5 MG PO TABS
0.5000 mg | ORAL_TABLET | Freq: Two times a day (BID) | ORAL | Status: DC | PRN
  Administered 2019-01-04 – 2019-01-08 (×5): 0.5 mg via ORAL
  Filled 2019-01-04 (×4): qty 1

## 2019-01-04 MED ORDER — AMOXICILLIN-POT CLAVULANATE 875-125 MG PO TABS
1.0000 | ORAL_TABLET | Freq: Two times a day (BID) | ORAL | Status: AC
  Administered 2019-01-04 – 2019-01-07 (×8): 1 via ORAL
  Filled 2019-01-04 (×8): qty 1

## 2019-01-04 NOTE — Progress Notes (Signed)
Palliative Care Progress Note  Reason for consult: Goals of care   I met again today with patient's son, [name], outside of her room.  We discussed her clinical course over the past couple of days and continued concern about her long-term prognosis.  [name] reports that family feels she is "much better" today and they remain hopeful that she will return to her prior baseline.  SUMMARY OF RECOMMENDATIONS   - Family remains heavily invested in plan for full scope treatment including full code in the event of cardiac or respiratory arrest.  I do not think this is likely to change in the near future based on my conversations with family. - Palliative to continue to follow intermittently to continue to support family, build rapport, and progress conversation regarding long-term goals of care as family is emotionally able to do so.  Total time: 20 minutes Greater than 50%  of this time was spent counseling and coordinating care related to the above assessment and plan.  Micheline Rough, MD [place] Team [phone]

## 2019-01-04 NOTE — Progress Notes (Signed)
PROGRESS NOTE  Tammy Fernandez AYO:[id number] DOB: 01/14/1924 DOA: 12/27/2018 PCP: Myles Lipps, MD   LOS: 8 days   Brief narrative: Patient is a 85 year old female with past medical history relevant for SIADH on demeclocycline, hypothyroidism, hypertension, hyperlipidemia, COPD on 2 L of oxygen, history of CVA, grade 2 diastolic dysfunction and severe pulmonary hypertension who was admitted to the hospital with acute metabolic encephalopathy due to hyponatremia and hypertension. Patient hospital course has been complicated byacute on chronic hypoxic and hypercapnic respiratory failure as well.  Assessment/Plan:  Principal Problem:   Hyponatremia Active Problems:   GERD (gastroesophageal reflux disease)   History of CVA (cerebrovascular accident)   Anxiety state   Acute on chronic respiratory failure with hypercapnia (HCC)   Essential hypertension   Hypothyroidism   Chronic diastolic CHF (congestive heart failure) (HCC)   Severe pulmonary arterial systolic hypertension (HCC)   CAD (coronary artery disease)   Hypochloremic alkalosis   Acute metabolic encephalopathy   Hypertensive urgency   Cough  Acute on chronic hyponatremia/SIADH: Sodium level was low at 121 on admission.  Patient was tried on tolvaptan.  Nephrology consult appreciated.  Currently on Boost 3 times daily and normal saline.  Level is improving, up to 142 today.   Hypochloremic metabolic alkalosis -likely due to volume contraction.  With IV hydration, serum bicarb level is improving, down to 26 today.  Acute on chronic hypoxic and hypercapnic respiratory failure:Patientwason 2 L oxygen at home. Baseline PCO2 level is around 80. ABGs havesubsequently showed persistent hypercapnia with adequate metabolic compensation.Patient remains on nightly BiPAP and oxygen via nasal cannula during the day.  Currently at 4 L/min.  Patient remains full code.  Acute metabolic encephalopathy,multifactorial:Altered  mental status/somnolence/body movements: Fluctuating mental status.  Alert, awake but not able to have a normal conversation on my evaluation.  Chronic recurrent aspiration - She was seen by speech and currently on dysphagia 3 diet. They suggested GI consult, evaluated by GI. Patient is currently not stable enough to undergo EGD.  Left lower lobe pneumonia:Suspect recurrent aspiration pneumonia.  Patient was started on IV Zosyn.  Repeat chest x-ray on 1/30 did not show any worsening.  Continue agressive chest physiotherapy and bronchodilator therapy.Continue IV antibiotics to complete 7-day course.  Hypothyroidism:Clinically euthyroid on current regimen.   Accelerated hypertension:blood pressurescontinue to fluctuate.  Last 24 hours, 171/30 at the max and 85/18 at the bottom.Continue amlodipine, propanolol.  Currently on IV hydralazine as needed for elevated blood pressure over 160.   History of stroke:Continue aspirin. No evidence of new stroke.  Chronic diastolic heart failure:Currently not in exacerbation.  GERD: On PPI in the morning and Pepcid at night. Will continue.  Advanced directives - palliative care consult appreciated.  Family wants aggressive treatment at this time.    Pending PT evaluation. Patient probably needs SNF placement.  VTE Prophylaxis:Lovenox Code Status: Full Code Disposition Plan:Family wants aggressive treatment at this time.    Pending PT evaluation. Patient probably needs SNF placement.  Antibiotics: IV Zosyn (1/29 -continue)  Continuous Infusions:  . sodium chloride 50 mL/hr at 01/04/19 0500  . sodium chloride Stopped (01/03/19 1421)  . piperacillin-tazobactam (ZOSYN)  IV Stopped (01/04/19 0315)    Scheduled Meds: . amLODipine  10 mg Oral Daily  . aspirin EC  81 mg Oral Daily  . chlorhexidine  15 mL Mouth Rinse BID  . enoxaparin (LOVENOX) injection  40 mg Subcutaneous Daily  . famotidine  20 mg Oral QHS  . feeding supplement   1  Container Oral TID BM  . fluticasone  1 spray Each Nare BID  . levothyroxine  50 mcg Oral Q0600  . loratadine  10 mg Oral Daily  . mouth rinse  15 mL Mouth Rinse BID  . pantoprazole  40 mg Oral Daily  . propranolol ER  60 mg Oral Daily    PRN meds: acetaminophen **OR** acetaminophen, albuterol, ALPRAZolam, dextromethorphan-guaiFENesin, docusate sodium, hydrALAZINE, meclizine, olopatadine, ondansetron **OR** ondansetron (ZOFRAN) IV, senna-docusate   Consultants:  Pulmonary  GI  Nephrology  Palliative care  Procedures:  None   Subjective: Patient was seen and examined this morning.  Elderly African-American female.  Not in distress.  Able to open eyes on verbal command, not able to have a meaningful conversation.  Objective: Vitals:   01/04/19 0651 01/04/19 0720  BP:    Pulse: 74   Resp: 19   Temp:  99.4 F (37.4 C)  SpO2: 100%     Intake/Output Summary (Last 24 hours) at 01/04/2019 0949 Last data filed at 01/04/2019 0500 Gross per 24 hour  Intake 1395.16 ml  Output 1400 ml  Net -4.84 ml   Filed Weights   12/28/18 0001  Weight: 78.8 kg   Body mass index is 31.77 kg/m.   Physical Exam: GENERAL: Elderly African-American female, not in physical distress HENT: No scleral pallor or icterus. Pupils equally reactive to light. Oral mucosa is moist NECK: is supple, no palpable thyroid enlargement. CHEST: Clear to auscultation. No crackles or wheezes. Non tender on palpation. Diminished breath sounds bilaterally. CVS: S1 and S2 heard, no murmur. Regular rate and rhythm. No pericardial rub. ABDOMEN: Soft, non-tender, bowel sounds are present. No palpable hepato-splenomegaly. EXTREMITIES: No edema. CNS: Opens eyes on verbal command, not able to have a normal conversation. SKIN: warm and dry without rashes.  Data Review: I have personally reviewed the laboratory data and studies available.  Lorin Glass, MD  [facility] 01/04/2019

## 2019-01-05 LAB — CBC WITH DIFFERENTIAL/PLATELET
Abs Immature Granulocytes: 0.03 10*3/uL (ref 0.00–0.07)
Abs Immature Granulocytes: 0.03 10*3/uL (ref 0.00–0.07)
Basophils Absolute: 0 10*3/uL (ref 0.0–0.1)
Basophils Absolute: 0 10*3/uL (ref 0.0–0.1)
Basophils Relative: 0 %
Basophils Relative: 0 %
Eosinophils Absolute: 0.2 10*3/uL (ref 0.0–0.5)
Eosinophils Absolute: 0.2 10*3/uL (ref 0.0–0.5)
Eosinophils Relative: 3 %
Eosinophils Relative: 2 %
HCT: 27.8 % — ABNORMAL LOW (ref 36.0–46.0)
HCT: 29.2 % — ABNORMAL LOW (ref 36.0–46.0)
Hemoglobin: 8.3 g/dL — ABNORMAL LOW (ref 12.0–15.0)
Hemoglobin: 8.8 g/dL — ABNORMAL LOW (ref 12.0–15.0)
Immature Granulocytes: 0 %
Immature Granulocytes: 0 %
LYMPHS PCT: 28 %
Lymphocytes Relative: 26 %
Lymphs Abs: 2.3 10*3/uL (ref 0.7–4.0)
Lymphs Abs: 2 10*3/uL (ref 0.7–4.0)
MCH: 29.4 pg (ref 26.0–34.0)
MCH: 29.6 pg (ref 26.0–34.0)
MCHC: 29.9 g/dL — ABNORMAL LOW (ref 30.0–36.0)
MCHC: 30.1 g/dL (ref 30.0–36.0)
MCV: 98.6 fL (ref 80.0–100.0)
MCV: 98.3 fL (ref 80.0–100.0)
Monocytes Absolute: 0.9 10*3/uL (ref 0.1–1.0)
Monocytes Absolute: 0.9 10*3/uL (ref 0.1–1.0)
Monocytes Relative: 10 %
Monocytes Relative: 11 %
NRBC: 0 % (ref 0.0–0.2)
NRBC: 0 % (ref 0.0–0.2)
Neutro Abs: 4.9 10*3/uL (ref 1.7–7.7)
Neutro Abs: 4.8 10*3/uL (ref 1.7–7.7)
Neutrophils Relative %: 59 %
Neutrophils Relative %: 61 %
PLATELETS: 224 10*3/uL (ref 150–400)
Platelets: 210 10*3/uL (ref 150–400)
RBC: 2.82 MIL/uL — ABNORMAL LOW (ref 3.87–5.11)
RBC: 2.97 MIL/uL — ABNORMAL LOW (ref 3.87–5.11)
RDW: 13 % (ref 11.5–15.5)
RDW: 13.2 % (ref 11.5–15.5)
WBC: 8.3 10*3/uL (ref 4.0–10.5)
WBC: 7.9 10*3/uL (ref 4.0–10.5)

## 2019-01-05 LAB — BASIC METABOLIC PANEL
Anion gap: 11 (ref 5–15)
Anion gap: 8 (ref 5–15)
BUN: 15 mg/dL (ref 8–23)
BUN: 19 mg/dL (ref 8–23)
CO2: 37 mmol/L — ABNORMAL HIGH (ref 22–32)
CO2: 39 mmol/L — ABNORMAL HIGH (ref 22–32)
Calcium: 9.2 mg/dL (ref 8.9–10.3)
Calcium: 9.5 mg/dL (ref 8.9–10.3)
Chloride: 86 mmol/L — ABNORMAL LOW (ref 98–111)
Chloride: 88 mmol/L — ABNORMAL LOW (ref 98–111)
Creatinine, Ser: 0.79 mg/dL (ref 0.44–1.00)
Creatinine, Ser: 0.84 mg/dL (ref 0.44–1.00)
GFR calc Af Amer: >60 mL/min (ref >60)
GFR calc non Af Amer: >60 mL/min (ref >60)
GFR calc non Af Amer: >60 mL/min (ref >60)
Glucose, Bld: 97 mg/dL (ref 70–99)
Glucose, Bld: 97 mg/dL (ref 70–99)
Potassium: 2.7 mmol/L — CL (ref 3.5–5.1)
Potassium: 3.8 mmol/L (ref 3.5–5.1)
SODIUM: 134 mmol/L — AB (ref 135–145)
Sodium: 135 mmol/L (ref 135–145)

## 2019-01-05 MED ORDER — POTASSIUM CHLORIDE CRYS ER 20 MEQ PO TBCR
40.0000 meq | EXTENDED_RELEASE_TABLET | ORAL | Status: AC
  Administered 2019-01-05 (×2): 40 meq via ORAL
  Filled 2019-01-05 (×2): qty 2

## 2019-01-05 NOTE — Progress Notes (Signed)
CRITICAL VALUE ALERT  Critical Value:  K+2.7  Date & Time Notied:  01/05/2019 3016  Provider Notified: On Call   Orders Received/Actions taken: See orders

## 2019-01-05 NOTE — Care Management Important Message (Signed)
Important Message  Patient Details  Name: Tammy Fernandez MRN: [id number] Date of Birth: Jun 15, 1924   Medicare Important Message Given:  Yes    [name], Keirstin Musil 01/05/2019, 11:14 AMImportant Message  Patient Details  Name: Tammy Fernandez MRN: [id number] Date of Birth: Jun 15, 1924   Medicare Important Message Given:  Yes    [name], Clotee Schlicker 01/05/2019, 11:14 AM

## 2019-01-05 NOTE — Progress Notes (Signed)
Physical Therapy Treatment Patient Details Name: Tammy Fernandez MRN: [id number] DOB: 06/10/1924 Today's Date: 01/05/2019    History of Present Illness 85 year old with past medical history relevant for SIADH on Depakote cycling, hypothyroidism, hypertension, hyperlipidemia, COPD on 2 liters chronically, history of CVA, grade 2 diastolic dysfunction and severe pulmonary hypertension by echo on 12/20/2017, admitted with acute metabolic encephalopathy due to hyponatremia and hypertension.    PT Comments    Patient seen to attempt progressive mobility. Patient very talkative throughout session concerning her faith - at times conversation becoming tangential and often repetitive. Patient requiring Mod A +2 throughout session for all mobility/stability with increased motivational cueing to complete each task. Unable to progress gait for patient/therapist safety. Due to current functional status and need for physical assist for mobility will recommend SNF - family likely to prefer [facility] services.     Follow Up Recommendations  Home health PT;Supervision/Assistance - 24 hour;SNF     Equipment Recommendations  None recommended by PT    Recommendations for Other Services       Precautions / Restrictions Precautions Precautions: Fall Restrictions Weight Bearing Restrictions: No    Mobility  Bed Mobility Overal bed mobility: Needs Assistance Bed Mobility: Supine to Sit     Supine to sit: HOB elevated;Mod assist;+2 for safety/equipment     General bed mobility comments: Mod A +2 to come to EOB - use of bed pads  Transfers Overall transfer level: Needs assistance Equipment used: 2 person hand held assist Transfers: Sit to/from [employer] Sit to Stand: Mod assist;+2 physical assistance Stand pivot transfers: Mod assist;+2 physical assistance       General transfer comment: Mod A +2 with 2 trials to stand at bedside; increased time and cueing for patient to shuffle feet  to complete pivot transfer  Ambulation/Gait             General Gait Details: deferred   Stairs             Wheelchair Mobility    Modified Rankin (Stroke Patients Only)       Balance Overall balance assessment: Needs assistance Sitting-balance support: Bilateral upper extremity supported;Feet supported Sitting balance-[name]: Fair     Standing balance support: Bilateral upper extremity supported;During functional activity Standing balance-[name]: Poor                              Cognition Arousal/Alertness: Awake/alert Behavior During Therapy: Anxious;Flat affect;Restless Overall Cognitive Status: Impaired/Different from baseline Area of Impairment: Orientation;Following commands;Safety/judgement;Problem solving                 Orientation Level: Disoriented to;Time;Situation     Following Commands: Follows one step commands inconsistently;Follows one step commands with increased time Safety/Judgement: Decreased awareness of deficits;Decreased awareness of safety   Problem Solving: Slow processing;Decreased initiation;Difficulty sequencing;Requires verbal cues;Requires tactile cues        Exercises      General Comments General comments (skin integrity, edema, etc.): family present and supportive      Pertinent Vitals/Pain Pain Assessment: Faces Faces Pain Scale: No hurt    Home Living                      Prior Function            PT Goals (current goals can now be found in the care plan section) Acute Rehab PT Goals Patient Stated Goal: to go  home with HHPT  PT Goal Formulation: With patient Time For Goal Achievement: 01/11/19 Potential to Achieve Goals: Good Progress towards PT goals: Progressing toward goals    Frequency    Min 3X/week      PT Plan Current plan remains appropriate    Co-evaluation              AM-PAC PT "6 Clicks" Mobility   Outcome Measure  Help needed  turning from your back to your side while in a flat bed without using bedrails?: A Lot Help needed moving from lying on your back to sitting on the side of a flat bed without using bedrails?: A Lot Help needed moving to and from a bed to a chair (including a wheelchair)?: A Lot Help needed standing up from a chair using your arms (e.g., wheelchair or bedside chair)?: A Lot Help needed to walk in hospital room?: A Lot Help needed climbing 3-5 steps with a railing? : Total 6 Click Score: 11    End of Session Equipment Utilized During Treatment: Gait belt;Oxygen Activity Tolerance: Patient tolerated treatment well Patient left: in chair;with call bell/phone within reach;with chair alarm set;with family/visitor present Nurse Communication: Mobility status PT Visit Diagnosis: Difficulty in walking, not elsewhere classified (R26.2);Unsteadiness on feet (R26.81);Muscle weakness (generalized) (M62.81)     Time: 9735-3299 PT Time Calculation (min) (ACUTE ONLY): 26 min  Charges:  $Therapeutic Activity: 23-37 mins                     Kipp Laurence, PT, DPT Supplemental Physical Therapist 01/05/19 3:40 PM Pager: [phone] Office: [phone]

## 2019-01-05 NOTE — Progress Notes (Signed)
Pt is now on CPAP and continuous pulse ox. Talking to her God & praising the Lord. 95% sat on O2  3lnc

## 2019-01-05 NOTE — Progress Notes (Addendum)
  Speech Language Pathology Treatment: Dysphagia  Patient Details Name: Tammy Fernandez MRN: [id number] DOB: 05/30/1924 Today's Date: 01/05/2019 Time: 1335-1400 SLP Time Calculation (min) (ACUTE ONLY): 25 min  Assessment / Plan / Recommendation Clinical Impression  SLP follow up after MBS and first visit since esophagram.    Note results of esophagram:  Esophagram results 12/30/2018 1.Presbyesophagus with poor primary esophageal stripping wave and tertiary wave contractions. 2. Not able to evaluate the gastroesophageal junction as contrast would not traverse beyond this region despite dry swallowing, ingestion of water, change of patient position and delayed imaging. Per Dr Tammy Hew D.   Diet of Dys3/thin was recommended based on oropharyngeal swallow but deferred to MD/GI for diet recommendation based on esophagram.  Pt today refused all intake with this SLP - voice was clear - as she repeatedly stated "I don't care, I'm gonna be in the graveyard".    Pt continues with hypercapnia, hypokalemia, hyponatremia, HG 8.3 all concerning for pt's current medical status.  Daughter Tammy Fernandez reports pt has not consumed anything but a little bit of Ensure and applesauce.  Also note pt withn o access.  SLP can not make a diet recommendation based on esophageal function.    Tammy Fernandez that if pt is to remain on diet would recommend only a small amount at a time - staying upright for a good period of time after intake.    SLP to follow up per daughter's wishes, do not feel comfortable recommending diet due to primary esophageal deficits and suspect she may have some chronic aspiration due to level of esophageal deficits as pt did NOT clear any barium into stomach and appeared to backflow significantly.  Son Tammy Fernandez reports pt coughing with intake this weekend- highly suspicious of aspiration due to esophageal dysphagia.  Paged Tammy Fernandez with questions re: diet/plan and did not receive call  back.  Forwarded concern to hospitalist.  Will follow up with pt per family Tammy Fernandez request.  .      HPI HPI: 85 year old female admitted 12/27/2018 with cough, leg edema and mild confusion. PMH: SIADH on Depakote cycling, hypothyroidism, hypertension, hyperlipidemia, COPD on 2 liters chronically, history of CVA, grade 2 diastolic dysfunction and severe pulmonary hypertension by echo on 12/20/2017.  Pt was scheduled for OP MBS last week but did not come for testing - ? due to illness.  Per notes, pt reports problems with meats and breads.        SLP Plan  Continue with current plan of care(will follow up after GI and medical team make recommendations)       Recommendations  Diet recommendations: Other(comment)(defer to GI and or medical team) Compensations: Other (Comment)                Plan: Continue with current plan of care(will follow up after GI and medical team make recommendations)       GO                Tammy Fernandez 01/05/2019, 2:19 PM   Tammy Burnet, MS [facility] SLP Acute Rehab Services Pager [phone] Office [phone]   Hospitalist did advise this SLP via phone at 1503 that he would speak to the family the next date regarding pt's dysphagia.  Thanks

## 2019-01-05 NOTE — Significant Event (Addendum)
Rapid Response Event Note  Overview: Time Called: 2140 Arrival Time: 2130 Event Type: Respiratory, Other (Comment)(Unarrousable)  Initial Focused Assessment: Patient lying in bed, does not arouse to name or touch. Pulse is palpable and bounding, heart sounds S1 & S2 with no adventitious heart sounds. Breathing appears shallow and slow. Breaths sounds clear, diminished in the upper lobes.  Interventions: -Sternal rub with response. Patient now awake and singing her prayers, rejoicing the Lord.  -NP notified in regards to checking labs, I informed her that patient's oxygen saturation remained 100% and that I had NOT placed an order for an ABG. Order received for BMP. -NP ordered continuous pulse oximetry check.  -Patient placed on her ordered CPAP settings by respiratory therapist at bedside.  Plan of Care (if not transferred): RN to monitor patient's ability to maintain airway, as well as for changes in mentation. RN to placed patient on continuous pulse oximetry monitoring.   Event Summary:  Tammy Fernandez

## 2019-01-05 NOTE — Progress Notes (Signed)
PROGRESS NOTE  Tammy Fernandez SVX:[id number] DOB: March 08, 1924 DOA: 12/27/2018 PCP: Myles Lipps, MD   LOS: 9 days   Brief narrative: Patient is a 85 year old female with past medical history relevant for SIADH on demeclocycline, hypothyroidism, hypertension, hyperlipidemia, COPD on 2 L of oxygen, history of CVA, grade 2 diastolic dysfunction and severe pulmonary hypertension who was admitted to the hospital with acute metabolic encephalopathy due to hyponatremia and hypertension. Patient's hospital course has been complicated byacute on chronic hypoxic and hypercapnic respiratory failure as well.  Subjective: Patient was seen and examined this morning.  Alert, awake, continues to utter nonrelevant words.  Daughter at bedside.  Family was under the impression that her mental status was improving yesterday.  It seems to be worse today.  Assessment/Plan:  Principal Problem:   Hyponatremia Active Problems:   GERD (gastroesophageal reflux disease)   History of CVA (cerebrovascular accident)   Anxiety state   Acute on chronic respiratory failure with hypercapnia (HCC)   Essential hypertension   Hypothyroidism   Chronic diastolic CHF (congestive heart failure) (HCC)   Severe pulmonary arterial systolic hypertension (HCC)   CAD (coronary artery disease)   Hypochloremic alkalosis   Acute metabolic encephalopathy   Hypertensive urgency   Cough  Acute on chronic hyponatremia/SIADH: Sodium level was low at 121 on admission.  Patient was tried on tolvaptan.  Nephrology consult appreciated.  Currently on Boost 3 times daily and normal saline.  Sodium improved to 1.2 yesterday, but down to 134 today.  Continue to trend.  Acute on chronic hypoxic and hypercapnic respiratory failure:Patientwason 2 L oxygen at home. Baseline PCO2 level is around 80. ABGs havesubsequently showed persistent hypercapnia with adequate metabolic compensation.Patient remains on nightly BiPAP and oxygen via nasal  cannula during the day.  Currently at 4 L/min.  Patient remains full code.   Acute metabolic encephalopathy,multifactorial:Altered mental status/somnolence/body movements: Fluctuating mental status.  Alert, awake but not able to have a normal conversation on my evaluation.  Hypochloremic metabolic alkalosis -likely due to volume contraction.  With IV hydration, serum bicarb level improved down to 26 yesterday but it is back up to 27 today.  Continue to monitor.    Hypokalemia -potassium level low at 2.7 today.  80 mEq of oral potassium ordered for replacement today.  Repeat level tomorrow.  Chronic recurrent aspiration - She was seen by speech and currently on dysphagia 3 diet. They suggested GI consult, evaluated by GI. Patient is currently not stable enough to undergo EGD.  Left lower lobe pneumonia:Suspect recurrent aspiration pneumonia.  Started on antibiotics.  Repeat chest x-ray on 1/30 did not show any worsening.  Continue agressive chest physiotherapy and bronchodilator therapy.Currently on oral Augmentin, to complete 7-day course on 2/5.  Hypothyroidism:Clinically euthyroid on current regimen.   Accelerated hypertension:blood pressuresmostly in normal range.  Continue amlodipine, propanolol.  Currently on IV hydralazine as needed for elevated blood pressure over 160.   History of stroke:Continue aspirin. No evidence of new stroke.  Chronic diastolic heart failure:Currently not in exacerbation.  GERD: On PPI in the morning and Pepcid at night. Will continue.  Advanced directives-palliative care consult appreciated. Family wants aggressive treatment at this time. Pending PT evaluation.Patient probably needs SNF placement but family does not want nursing home placement wants to take her home when discharged.  Mobility: Pending PT evaluation. Diet: Dysphagia 3 diet. DVT prophylaxis:  Lovenox Code Status:   Code Status: Full Code  Family Communication:   Daughter at bedside today. Disposition Plan:  Family  wants to take patient home when ready for discharge.  Consultants:  Pulmonology  Palliative care  Nephrology  Procedures:  None  Antimicrobials:  Anti-infectives (From admission, onward)   Start     Dose/Rate Route Frequency Ordered Stop   01/04/19 1015  amoxicillin-clavulanate (AUGMENTIN) 875-125 MG per tablet 1 tablet     1 tablet Oral Every 12 hours 01/04/19 1006     01/02/19 1000  vancomycin (VANCOCIN) 1,250 mg in sodium chloride 0.9 % 250 mL IVPB  Status:  Discontinued     1,250 mg 166.7 mL/hr over 90 Minutes Intravenous Every 36 hours 01/01/19 0433 01/02/19 0820   01/01/19 0345  vancomycin (VANCOCIN) 1,500 mg in sodium chloride 0.9 % 500 mL IVPB     1,500 mg 250 mL/hr over 120 Minutes Intravenous  Once 01/01/19 0340 01/01/19 0548   12/31/18 1430  piperacillin-tazobactam (ZOSYN) IVPB 3.375 g  Status:  Discontinued     3.375 g 12.5 mL/hr over 240 Minutes Intravenous Every 8 hours 12/31/18 1412 01/04/19 1006   12/31/18 1415  piperacillin-tazobactam (ZOSYN) IVPB 2.25 g  Status:  Discontinued    Note to Pharmacy:  pls renally dose   2.25 g 100 mL/hr over 30 Minutes Intravenous Every 6 hours 12/31/18 1401 12/31/18 1409   12/27/18 2330  demeclocycline (DECLOMYCIN) tablet 150 mg  Status:  Discontinued    Note to Pharmacy:  TK 1/2 T PO BID     150 mg Oral Every 12 hours 12/27/18 2149 12/28/18 1600      Infusions: . sodium chloride 50 mL/hr at 01/04/19 0500  . sodium chloride Stopped (01/03/19 1421)    Scheduled Meds: . amLODipine  10 mg Oral Daily  . amoxicillin-clavulanate  1 tablet Oral Q12H  . aspirin EC  81 mg Oral Daily  . chlorhexidine  15 mL Mouth Rinse BID  . enoxaparin (LOVENOX) injection  40 mg Subcutaneous Daily  . famotidine  20 mg Oral QHS  . feeding supplement  1 Container Oral TID BM  . fluticasone  1 spray Each Nare BID  . levothyroxine  50 mcg Oral Q0600  . loratadine  10 mg Oral Daily  . mouth  rinse  15 mL Mouth Rinse BID  . pantoprazole  40 mg Oral Daily  . propranolol ER  60 mg Oral Daily    PRN meds: acetaminophen **OR** acetaminophen, albuterol, ALPRAZolam, dextromethorphan-guaiFENesin, docusate sodium, hydrALAZINE, meclizine, olopatadine, ondansetron **OR** ondansetron (ZOFRAN) IV, senna-docusate   Objective: Vitals:   01/04/19 2106 01/05/19 0458  BP: (!) 107/42 (!) 139/46  Pulse: 66 64  Resp: 20 (!) 22  Temp: 98.4 F (36.9 C) (!) 97.5 F (36.4 C)  SpO2: 100% 99%    Intake/Output Summary (Last 24 hours) at 01/05/2019 1244 Last data filed at 01/04/2019 1600 Gross per 24 hour  Intake 60 ml  Output 500 ml  Net -440 ml   02/02 0701 - 02/03 0700 In: 60 [P.O.:60] Out: 500 [Urine:500] No intake/output data recorded. Filed Weights   12/28/18 0001  Weight: 78.8 kg   Body mass index is 31.77 kg/m.   Physical Exam: GENERAL: Pleasant, elderly, African-American female.  Not in physical distress HENT: No scleral pallor or icterus. Pupils equally reactive to light. Oral mucosa is moist NECK: is supple, no palpable thyroid enlargement. CHEST: Diminished air entry at both bases.  No crackles or wheezing. CVS: S1 and S2 heard, no murmur. Regular rate and rhythm. No pericardial rub. ABDOMEN: Soft, non-tender, bowel sounds are present. No palpable hepato-splenomegaly. EXTREMITIES:  No edema. CNS: Alert, awake, continues to babble nonrelevant words. SKIN: warm and dry without rashes.  Data Review: I have personally reviewed the laboratory data and studies available. Recent Labs  Lab 01/01/19 0021  01/02/19 1227 01/02/19 1815 01/03/19 0302 01/04/19 0556 01/05/19 0553  NA 128*   < > 133* 133* 135 142 134*  K 4.1   < > 3.3* 3.4* 3.4* 3.7 2.7*  CL 74*   < > 81* 81* 84* 106 86*  CO2 48*   < > 39* 38* 36* 26 37*  GLUCOSE 121*   < > 86 76 67* 123* 97  BUN 25*   < > 16 17 18 15 15   CREATININE 0.90   < > 0.70 0.73 0.72 0.48 0.79  CALCIUM 9.7   < > 9.3 9.3 9.3 7.2* 9.2   MG 2.2  --   --   --   --   --   --    < > = values in this interval not displayed.   Recent Labs  Lab 12/30/18 0548 12/31/18 0233 01/01/19 0021 01/03/19 0302 01/05/19 0553  WBC 11.6* 9.2 11.3* 9.1 8.3  NEUTROABS  --   --  8.2*  --  4.9  HGB 9.9* 10.5* 10.1* 9.6* 8.3*  HCT 33.0* 35.7* 34.8* 32.0* 27.8*  MCV 95.1 97.3 99.1 95.2 98.6  PLT 228 233 215 231 210   Lorin GlassBinaya Jood Retana, MD  [facility] 01/05/2019

## 2019-01-06 LAB — BASIC METABOLIC PANEL
ANION GAP: 9 (ref 5–15)
BUN: 20 mg/dL (ref 8–23)
CO2: 38 mmol/L — ABNORMAL HIGH (ref 22–32)
Calcium: 9.4 mg/dL (ref 8.9–10.3)
Chloride: 90 mmol/L — ABNORMAL LOW (ref 98–111)
Creatinine, Ser: 0.79 mg/dL (ref 0.44–1.00)
GFR calc Af Amer: >60 mL/min (ref >60)
GFR calc non Af Amer: >60 mL/min (ref >60)
Glucose, Bld: 81 mg/dL (ref 70–99)
Potassium: 4.5 mmol/L (ref 3.5–5.1)
Sodium: 137 mmol/L (ref 135–145)

## 2019-01-06 LAB — BLOOD GAS, ARTERIAL
Acid-Base Excess: 14.9 mmol/L — ABNORMAL HIGH (ref 0.0–2.0)
Bicarbonate: 39.7 mmol/L — ABNORMAL HIGH (ref 20.0–28.0)
DRAWN BY: 331471
O2 Content: 3 L/min
O2 Saturation: 98.5 %
Patient temperature: 37
pCO2 arterial: 49.8 mmHg — ABNORMAL HIGH (ref 32.0–48.0)
pH, Arterial: 7.513 — ABNORMAL HIGH (ref 7.350–7.450)
pO2, Arterial: 108 mmHg (ref 83.0–108.0)

## 2019-01-06 LAB — CBC WITH DIFFERENTIAL/PLATELET
Abs Immature Granulocytes: 0.05 10*3/uL (ref 0.00–0.07)
BASOS ABS: 0 10*3/uL (ref 0.0–0.1)
Basophils Relative: 0 %
Eosinophils Absolute: 0.2 10*3/uL (ref 0.0–0.5)
Eosinophils Relative: 2 %
HCT: 28.9 % — ABNORMAL LOW (ref 36.0–46.0)
Hemoglobin: 8.6 g/dL — ABNORMAL LOW (ref 12.0–15.0)
IMMATURE GRANULOCYTES: 1 %
LYMPHS ABS: 2.4 10*3/uL (ref 0.7–4.0)
Lymphocytes Relative: 23 %
MCH: 29.2 pg (ref 26.0–34.0)
MCHC: 29.8 g/dL — ABNORMAL LOW (ref 30.0–36.0)
MCV: 98 fL (ref 80.0–100.0)
Monocytes Absolute: 1 10*3/uL (ref 0.1–1.0)
Monocytes Relative: 9 %
NRBC: 0 % (ref 0.0–0.2)
Neutro Abs: 6.6 10*3/uL (ref 1.7–7.7)
Neutrophils Relative %: 65 %
Platelets: 243 10*3/uL (ref 150–400)
RBC: 2.95 MIL/uL — ABNORMAL LOW (ref 3.87–5.11)
RDW: 13.6 % (ref 11.5–15.5)
WBC: 10.1 10*3/uL (ref 4.0–10.5)

## 2019-01-06 NOTE — Progress Notes (Signed)
Pt has 0 output documented, no report or any occurrences. Last documented was 2/1. Bladder scan performed yeilding , On call notified for catheter placement for retention.   RN and NT were attempting to place catheter when pt voided. Bladder scan done again yielding . Will continue to monitor bladder retention throughout shift. Foley Catheter not attempted.

## 2019-01-06 NOTE — Progress Notes (Signed)
Pt has used [facility] in past and Pt and family request to work with them again.  Justin Mend, RN

## 2019-01-06 NOTE — Plan of Care (Signed)
Patient currently on 1L O2 [place].

## 2019-01-06 NOTE — Care Management Note (Signed)
Case Management Note  Patient Details  Name: Tammy Fernandez MRN: [id number] Date of Birth: 08-13-1924  Subjective/Objective:                  Discharge planning  Action/Plan: Per request of Dr. Pola Corn- request to see if she can qualify for home Bi-Pap sent to [facility].  Expected Discharge Date:                  Expected Discharge Plan:     In-House Referral:     Discharge planning Services     Post Acute Care Choice:    Choice offered to:     DME Arranged:    DME Agency:     HH Arranged:    HH Agency:     Status of Service:     If discussed at [employer], dates discussed:    Additional Comments:  Golda Acre, RN 01/06/2019, 11:54 AM

## 2019-01-06 NOTE — Progress Notes (Signed)
PROGRESS NOTE  Tammy Fernandez WUJ:[id number] DOB: 23-Oct-1924 DOA: 12/27/2018 PCP: Myles LippsSantiago, Irma M, MD   LOS: 10 days   Brief narrative: Patient is a 85 year old female with past medical history relevant for SIADH on demeclocycline, hypothyroidism, hypertension, hyperlipidemia, COPD on 2 L of oxygen, history of CVA, grade 2 diastolic dysfunction and severe pulmonary hypertension who was admitted to the hospital with acute metabolic encephalopathy due to hyponatremia and hypertension. Patient's hospital course has been complicated byacute on chronic hypoxic and hypercapnic respiratory failure as well.  Subjective: Patient was seen and examined this morning.  Remains on BiPAP.  Able to respond on verbal command.  However continues to babble nonrelevant words.  Not in distress.  Daughter at bedside.  I spoke to the daughter and patient's son [name] over the phone for 25 minutes.  Assessment/Plan:  Active Problems:   GERD (gastroesophageal reflux disease)   History of CVA (cerebrovascular accident)   Anxiety state   Acute on chronic respiratory failure with hypercapnia (HCC)   Essential hypertension   Hypothyroidism   Chronic diastolic CHF (congestive heart failure) (HCC)   Severe pulmonary arterial systolic hypertension (HCC)   CAD (coronary artery disease)   Hypochloremic alkalosis   Acute metabolic encephalopathy  Acute on chronic hyponatremia/SIADH:Sodium level was low at 121 on admission. Patient was tried on tolvaptan. Nephrology consult appreciated. Currently on Boost 3 times daily and normal saline.Sodium level has improved to normal.  Patient will follow-up with nephrology as an outpatient.  Acute onchronic hypoxic and hypercapnic respiratory failure:Patientwason 2 L oxygen at home.  She has severe pulmonary artery hypertension.  Baseline PCO2 level isaround 80. ABGs havesubsequently showed persistent hypercapnia with adequate metabolic compensation. In the  hospital, patient was kept on nightly BiPAP andoxygen via nasal cannuladuring the day. Currently at 4 L/min. Patient remains full code.  Post discharge, patient will continue 24/7 oxygen via nasal cannula.  Per my discussion with the case manager, will obtain an ABG.  [facility] is looking into to see if patient qualifies for nightly BiPAP at home.   Acute metabolic encephalopathy-likely related to dementia and hospital-acquired delirium.  Currently patient does not have any infection or metabolic abnormality otherwise.  Her mental status has been fluctuating in the hospital with good days and bad days.  I anticipate that once he is discharged to home at her family environment, her mental status will stabilize.  Chronic recurrent aspiration-She was seen by speech and currently on dysphagia 3 diet. Patient was evaluated by GI.  However because of her unstable mental status and advanced age,patient is currently not stable enough to undergo EGD.  Patient will follow-up with GI as an outpatient.  She me qualify for EGD as an outpatient.  Left lower lobe pneumonia:Suspect recurrent aspiration pneumonia. Started on antibiotics. Repeat chest x-ray on 1/30 did not show any worsening. Continue aggressive chest physiotherapy and bronchodilator therapy. Augmentin course to complete on 2/5.  Hypothyroidism:Clinically euthyroid on current regimen.   Accelerated hypertension:Presented with accelerated hypertension.  Blood pressurescurrently mostly in normal range.Continue amlodipine, propanolol.Currently on IV hydralazine as needed for elevated blood pressure over 160.   History of stroke:Continue aspirin. No evidence of new stroke.  Chronic diastolic heart failure:Currentlynot in exacerbation.  GERD: On PPI in the morning and Pepcid at night. Will continue.  Advanced directives-palliative care consult appreciated.  We had long conversation with patient's family  members including her son [name] who is a power of attorney.  Despite all the explanations and counseling, POA  wants to maintain patient a full code and also wants procedures like EGD if needed.   Mobility:  Physical therapy recommended home with home health Diet: Dysphagia 3 diet.  Boost supplement per nephrology recommendation. DVT prophylaxis: Lovenox Code Status:Full Code  Family Communication:  I spoke to the daughter at bedside and patient's son [name] over the phone for 25 minutes.  Disposition Plan: Family wants to take patient home when ready for discharge.  Consultants:  Pulmonology  Palliative care  Nephrology  Procedures:  None Antimicrobials:  Anti-infectives (From admission, onward)   Start     Dose/Rate Route Frequency Ordered Stop   01/04/19 1015  amoxicillin-clavulanate (AUGMENTIN) 875-125 MG per tablet 1 tablet     1 tablet Oral Every 12 hours 01/04/19 1006 01/07/19 2359   01/02/19 1000  vancomycin (VANCOCIN) 1,250 mg in sodium chloride 0.9 % 250 mL IVPB  Status:  Discontinued     1,250 mg 166.7 mL/hr over 90 Minutes Intravenous Every 36 hours 01/01/19 0433 01/02/19 0820   01/01/19 0345  vancomycin (VANCOCIN) 1,500 mg in sodium chloride 0.9 % 500 mL IVPB     1,500 mg 250 mL/hr over 120 Minutes Intravenous  Once 01/01/19 0340 01/01/19 0548   12/31/18 1430  piperacillin-tazobactam (ZOSYN) IVPB 3.375 g  Status:  Discontinued     3.375 g 12.5 mL/hr over 240 Minutes Intravenous Every 8 hours 12/31/18 1412 01/04/19 1006   12/31/18 1415  piperacillin-tazobactam (ZOSYN) IVPB 2.25 g  Status:  Discontinued    Note to Pharmacy:  pls renally dose   2.25 g 100 mL/hr over 30 Minutes Intravenous Every 6 hours 12/31/18 1401 12/31/18 1409   12/27/18 2330  demeclocycline (DECLOMYCIN) tablet 150 mg  Status:  Discontinued    Note to Pharmacy:  TK 1/2 T PO BID     150 mg Oral Every 12 hours 12/27/18 2149 12/28/18 1600     Infusions:  . sodium chloride 50 mL/hr at  01/04/19 0500  . sodium chloride Stopped (01/03/19 1421)    Scheduled Meds: . amLODipine  10 mg Oral Daily  . amoxicillin-clavulanate  1 tablet Oral Q12H  . aspirin EC  81 mg Oral Daily  . chlorhexidine  15 mL Mouth Rinse BID  . enoxaparin (LOVENOX) injection  40 mg Subcutaneous Daily  . famotidine  20 mg Oral QHS  . feeding supplement  1 Container Oral TID BM  . fluticasone  1 spray Each Nare BID  . levothyroxine  50 mcg Oral Q0600  . loratadine  10 mg Oral Daily  . mouth rinse  15 mL Mouth Rinse BID  . pantoprazole  40 mg Oral Daily  . propranolol ER  60 mg Oral Daily    PRN meds: acetaminophen **OR** acetaminophen, albuterol, ALPRAZolam, dextromethorphan-guaiFENesin, docusate sodium, hydrALAZINE, meclizine, olopatadine, ondansetron **OR** ondansetron (ZOFRAN) IV, senna-docusate   Objective: Vitals:   01/06/19 0440 01/06/19 0515  BP:  (!) 139/49  Pulse:  90  Resp:  (!) 24  Temp:  99.3 F (37.4 C)  SpO2: 90%     Intake/Output Summary (Last 24 hours) at 01/06/2019 1214 Last data filed at 01/05/2019 2130 Gross per 24 hour  Intake 0 ml  Output -  Net 0 ml   No intake/output data recorded. No intake/output data recorded. Filed Weights   12/28/18 0001  Weight: 78.8 kg   Body mass index is 31.77 kg/m.   Physical Exam: GENERAL: Elderly African-American female, on BiPAP.  Opens eyes on verbal command.  Babbling nonrelevant words.  HENT: No scleral pallor or icterus. Pupils equally reactive to light. Oral mucosa is moist NECK: is supple, no palpable thyroid enlargement. CHEST: Decreased air entry in both bases.  No crackles or wheezes.  CVS: S1 and S2 heard, no murmur. Regular rate and rhythm. No pericardial rub. ABDOMEN: Soft, non-tender, bowel sounds are present. No palpable hepato-splenomegaly. EXTREMITIES: No edema. CNS: Opens eyes on verbal command.  Not oriented. SKIN: warm and dry without rashes.  Data Review: I have personally reviewed the laboratory data and  studies available.  Recent Labs  Lab 12/31/18 0233 01/01/19 0021 01/03/19 0302 01/05/19 0553 01/05/19 2158  WBC 9.2 11.3* 9.1 8.3 7.9  NEUTROABS  --  8.2*  --  4.9 4.8  HGB 10.5* 10.1* 9.6* 8.3* 8.8*  HCT 35.7* 34.8* 32.0* 27.8* 29.2*  MCV 97.3 99.1 95.2 98.6 98.3  PLT 233 215 231 210 224   Recent Labs  Lab 01/01/19 0021  01/03/19 0302 01/04/19 0556 01/05/19 0553 01/05/19 2158 01/06/19 0614  NA 128*   < > 135 142 134* 135 137  K 4.1   < > 3.4* 3.7 2.7* 3.8 4.5  CL 74*   < > 84* 106 86* 88* 90*  CO2 48*   < > 36* 26 37* 39* 38*  GLUCOSE 121*   < > 67* 123* 97 97 81  BUN 25*   < > 18 15 15 19 20   CREATININE 0.90   < > 0.72 0.48 0.79 0.84 0.79  CALCIUM 9.7   < > 9.3 7.2* 9.2 9.5 9.4  MG 2.2  --   --   --   --   --   --    < > = values in this interval not displayed.    Lorin GlassBinaya Parrish Bonn, MD  [facility] 01/06/2019

## 2019-01-07 LAB — BASIC METABOLIC PANEL
ANION GAP: 11 (ref 5–15)
BUN: 16 mg/dL (ref 8–23)
CO2: 36 mmol/L — ABNORMAL HIGH (ref 22–32)
Calcium: 9.6 mg/dL (ref 8.9–10.3)
Chloride: 91 mmol/L — ABNORMAL LOW (ref 98–111)
Creatinine, Ser: 0.66 mg/dL (ref 0.44–1.00)
GFR calc Af Amer: >60 mL/min (ref >60)
GFR calc non Af Amer: >60 mL/min (ref >60)
Glucose, Bld: 87 mg/dL (ref 70–99)
Potassium: 3.6 mmol/L (ref 3.5–5.1)
Sodium: 138 mmol/L (ref 135–145)

## 2019-01-07 LAB — CBC WITH DIFFERENTIAL/PLATELET
Abs Immature Granulocytes: 0.04 10*3/uL (ref 0.00–0.07)
BASOS ABS: 0 10*3/uL (ref 0.0–0.1)
Basophils Relative: 0 %
Eosinophils Absolute: 0.2 10*3/uL (ref 0.0–0.5)
Eosinophils Relative: 2 %
HCT: 30.3 % — ABNORMAL LOW (ref 36.0–46.0)
Hemoglobin: 9 g/dL — ABNORMAL LOW (ref 12.0–15.0)
IMMATURE GRANULOCYTES: 0 %
Lymphocytes Relative: 25 %
Lymphs Abs: 2.6 10*3/uL (ref 0.7–4.0)
MCH: 28.8 pg (ref 26.0–34.0)
MCHC: 29.7 g/dL — ABNORMAL LOW (ref 30.0–36.0)
MCV: 97.1 fL (ref 80.0–100.0)
Monocytes Absolute: 0.8 10*3/uL (ref 0.1–1.0)
Monocytes Relative: 8 %
NEUTROS PCT: 65 %
Neutro Abs: 6.8 10*3/uL (ref 1.7–7.7)
PLATELETS: 258 10*3/uL (ref 150–400)
RBC: 3.12 MIL/uL — ABNORMAL LOW (ref 3.87–5.11)
RDW: 13.7 % (ref 11.5–15.5)
WBC: 10.5 10*3/uL (ref 4.0–10.5)
nRBC: 0 % (ref 0.0–0.2)

## 2019-01-07 NOTE — Care Management Note (Signed)
Case Management Note  Patient Details  Name: Tammy Fernandez MRN: [id number] Date of Birth: 1924/01/21  Subjective/Objective:                  tcf-husband  Action/Plan: Needs rn, aide, pt ot and sw Will alert md  Expected Discharge Date:                  Expected Discharge Plan:     In-House Referral:     Discharge planning Services     Post Acute Care Choice:    Choice offered to:     DME Arranged:    DME Agency:     HH Arranged:    HH Agency:     Status of Service:     If discussed at [employer] Meet[employer], dates discussed:    Additional Comments:  Golda Acre, RN 01/07/2019, 9:45 AM

## 2019-01-07 NOTE — Progress Notes (Signed)
PROGRESS NOTE  Tammy Fernandez APO:[id number] DOB: Jan 14, 1924 DOA: 12/27/2018 PCP: Myles Lipps, MD   LOS: 11 days   Brief narrative: Patient is a 85 year old female with past medical history relevant for SIADH on demeclocycline, hypothyroidism, hypertension, hyperlipidemia, COPD on 2 L of oxygen, history of CVA, grade 2 diastolic dysfunction and severe pulmonary hypertension who was admitted to the hospital with acute metabolic encephalopathy due to hyponatremia and hypertension. Patient's hospital course has been complicated byacute on chronic hypoxic and hypercapnic respiratory failure.  Clinically improved and approaching stability for discharge pending arrangement of home health and DME.  Subjective: Patient was interviewed and examined along with son at bedside.  Patient is alert and oriented to self, son, partly to place but not time.  Pleasantly confused.  No pain or dyspnea reported.  States that she is "anxious".  As per RN, no acute issues noted.  Assessment/Plan:  Active Problems:   GERD (gastroesophageal reflux disease)   History of CVA (cerebrovascular accident)   Anxiety state   Acute on chronic respiratory failure with hypercapnia (HCC)   Essential hypertension   Hypothyroidism   Chronic diastolic CHF (congestive heart failure) (HCC)   Severe pulmonary arterial systolic hypertension (HCC)   CAD (coronary artery disease)   Hypochloremic alkalosis   Acute metabolic encephalopathy  Acute on chronic hyponatremia/SIADH:Sodium level was low at 121 on admission. Patient was tried on tolvaptan. Nephrology consult appreciated.  Hyponatremia resolved.  Patient will follow-up with nephrology as an outpatient.  Acute onchronic hypoxic and hypercapnic respiratory failure:Patientwason 2 L oxygen at home.  She has severe pulmonary artery hypertension.  Baseline PCO2 level isaround 80. ABGs havesubsequently showed persistent hypercapnia with adequate metabolic  compensation. In the hospital, patient was kept on nightly BiPAP andoxygen via nasal cannuladuring the day. Currently at 3 L/min. Patient remains full code.  Post discharge, patient will continue 24/7 oxygen via nasal cannula.    Case management working on arranging BiPAP at discharge for home, likely to happen by tomorrow.  Acute metabolic encephalopathy-likely related to dementia and hospital-acquired delirium.  Currently patient does not have any infection or metabolic abnormality otherwise.  Her mental status has been fluctuating in the hospital with good days and bad days. It is anticipated that once he is discharged to home at her family environment, her mental status will stabilize.  Chronic recurrent aspiration-She was seen by speech and currently on dysphagia 3 diet. Patient was evaluated by GI.  However because of her unstable mental status and advanced age,patient is currently not stable enough to undergo EGD.  Patient will follow-up with GI as an outpatient to determine if she can undergo EGD at that time.  Left lower lobe pneumonia:Suspect recurrent aspiration pneumonia. Started on antibiotics. Repeat chest x-ray on 1/30 did not show any worsening. Continue aggressive chest physiotherapy and bronchodilator therapy. Augmentin course to complete on 2/5.  Stable.  Hypothyroidism:Clinically euthyroid on current regimen.   Accelerated hypertension:Presented with accelerated hypertension.  Mildly uncontrolled at times.Continue amlodipine, propanolol and PRN IV hydralazine  History of stroke:Continue aspirin. No evidence of new stroke.  Chronic diastolic heart failure:Compensated.  GERD: On PPI in the morning and Pepcid at night. Will continue.  Advanced directives-palliative care consult appreciated. Prior [facility] MD had long conversation with patient's family members including her son [name] who is a power of attorney.  Despite all the explanations and  counseling, POA wants to maintain patient a full code and also wants procedures like EGD if needed.   As per  PMT follow-up today, continue full scope of treatment at this time.  Mobility:  Physical therapy recommended home with home health Diet: Dysphagia 3 diet.  Boost supplement per nephrology recommendation. DVT prophylaxis: Lovenox Code Status:Full Code  Family Communication:  Discussed in detail with patient's son at bedside, updated care and answered questions. Disposition Plan: Family wants to take patient home when ready for discharge.  As discussed with RN, hopefully all DME will be arranged by tomorrow.  Consultants:  Pulmonology  Palliative care  Nephrology  Procedures:  None  Antimicrobials:  Anti-infectives (From admission, onward)   Start     Dose/Rate Route Frequency Ordered Stop   01/04/19 1015  amoxicillin-clavulanate (AUGMENTIN) 875-125 MG per tablet 1 tablet     1 tablet Oral Every 12 hours 01/04/19 1006 01/07/19 2359   01/02/19 1000  vancomycin (VANCOCIN) 1,250 mg in sodium chloride 0.9 % 250 mL IVPB  Status:  Discontinued     1,250 mg 166.7 mL/hr over 90 Minutes Intravenous Every 36 hours 01/01/19 0433 01/02/19 0820   01/01/19 0345  vancomycin (VANCOCIN) 1,500 mg in sodium chloride 0.9 % 500 mL IVPB     1,500 mg 250 mL/hr over 120 Minutes Intravenous  Once 01/01/19 0340 01/01/19 0548   12/31/18 1430  piperacillin-tazobactam (ZOSYN) IVPB 3.375 g  Status:  Discontinued     3.375 g 12.5 mL/hr over 240 Minutes Intravenous Every 8 hours 12/31/18 1412 01/04/19 1006   12/31/18 1415  piperacillin-tazobactam (ZOSYN) IVPB 2.25 g  Status:  Discontinued    Note to Pharmacy:  pls renally dose   2.25 g 100 mL/hr over 30 Minutes Intravenous Every 6 hours 12/31/18 1401 12/31/18 1409   12/27/18 2330  demeclocycline (DECLOMYCIN) tablet 150 mg  Status:  Discontinued    Note to Pharmacy:  TK 1/2 T PO BID     150 mg Oral Every 12 hours 12/27/18 2149 12/28/18 1600      Infusions:  . sodium chloride Stopped (01/06/19 1000)  . sodium chloride Stopped (01/03/19 1421)    Scheduled Meds: . amLODipine  10 mg Oral Daily  . amoxicillin-clavulanate  1 tablet Oral Q12H  . aspirin EC  81 mg Oral Daily  . chlorhexidine  15 mL Mouth Rinse BID  . enoxaparin (LOVENOX) injection  40 mg Subcutaneous Daily  . famotidine  20 mg Oral QHS  . feeding supplement  1 Container Oral TID BM  . fluticasone  1 spray Each Nare BID  . levothyroxine  50 mcg Oral Q0600  . loratadine  10 mg Oral Daily  . mouth rinse  15 mL Mouth Rinse BID  . pantoprazole  40 mg Oral Daily  . propranolol ER  60 mg Oral Daily    PRN meds: acetaminophen **OR** acetaminophen, albuterol, ALPRAZolam, dextromethorphan-guaiFENesin, docusate sodium, hydrALAZINE, meclizine, olopatadine, ondansetron **OR** ondansetron (ZOFRAN) IV, senna-docusate   Objective: Vitals:   01/07/19 0540 01/07/19 1433  BP: (!) 179/60 (!) 135/52  Pulse: 82 73  Resp: 17   Temp: 98.2 F (36.8 C) (!) 97.5 F (36.4 C)  SpO2: 98% 99%    Intake/Output Summary (Last 24 hours) at 01/07/2019 1713 Last data filed at 01/07/2019 1500 Gross per 24 hour  Intake 150 ml  Output 500 ml  Net -350 ml   02/04 0701 - 02/05 0700 In: 80 [P.O.:80] Out: 0  Total I/O In: 150 [P.O.:150] Out: 500 [Urine:500] Filed Weights   12/28/18 0001  Weight: 78.8 kg   Body mass index is 31.77 kg/m.  Physical Exam: GENERAL: Pleasant elderly female, moderately built and overweight, sitting up comfortably in bed without distress. CHEST: Decreased air entry in both bases but no crackles.  Rest of lung fields clear to auscultation.  No increased work of breathing. CVS: S1 and S2 heard, RRR.  No JVD, murmurs or pedal edema. ABDOMEN: Nondistended, soft and nontender.  Normal bowel sounds heard. No organomegaly or masses appreciated. EXTREMITIES: No edema.  Symmetric 5 x 5 power. CNS: Mental status as noted above.  No focal neurological  deficits. SKIN: warm and dry without rashes.  Data Review: I have personally reviewed the laboratory data and studies available.  Recent Labs  Lab 01/01/19 0021 01/03/19 0302 01/05/19 0553 01/05/19 2158 01/06/19 1251 01/07/19 0652  WBC 11.3* 9.1 8.3 7.9 10.1 10.5  NEUTROABS 8.2*  --  4.9 4.8 6.6 6.8  HGB 10.1* 9.6* 8.3* 8.8* 8.6* 9.0*  HCT 34.8* 32.0* 27.8* 29.2* 28.9* 30.3*  MCV 99.1 95.2 98.6 98.3 98.0 97.1  PLT 215 231 210 224 243 258   Recent Labs  Lab 01/01/19 0021  01/04/19 0556 01/05/19 0553 01/05/19 2158 01/06/19 0614 01/07/19 0652  NA 128*   < > 142 134* 135 137 138  K 4.1   < > 3.7 2.7* 3.8 4.5 3.6  CL 74*   < > 106 86* 88* 90* 91*  CO2 48*   < > 26 37* 39* 38* 36*  GLUCOSE 121*   < > 123* 97 97 81 87  BUN 25*   < > 15 15 19 20 16   CREATININE 0.90   < > 0.48 0.79 0.84 0.79 0.66  CALCIUM 9.7   < > 7.2* 9.2 9.5 9.4 9.6  MG 2.2  --   --   --   --   --   --    < > = values in this interval not displayed.    Marcellus Scott, MD, FACP, [facility]. [facility]  To contact the attending provider between 7A-7P or the covering provider during after hours 7P-7A, please log into the web site [email or web address] and access using universal [place] password for that web site. If you do not have the password, please call the hospital operator.

## 2019-01-07 NOTE — Progress Notes (Signed)
PMT progress note  Patient seen this am, she is awake but doesn't verbalize meaningfully, she is not on BIPAP at the moment. There is no family at bedside, call placed and discussed with son [name].   BP (!) 179/60 (BP Location: Right Arm)   Pulse 82   Temp 98.2 F (36.8 C) (Oral)   Resp 17   Ht 5\' 2"  (1.575 m)   Wt 78.8 kg   SpO2 98%   BMI 31.77 kg/m  Labs and imaging noted Chart reviewed.   Elderly lady, resting in bed Doesn't verbalize meaningfully In no distress Diminished breath sounds S1 S 2 No edema Opens eyes, tracks me in the room.   PPS 30%  Patient is a [age] year old female with past medical history relevant for SIADH on demeclocycline, hypothyroidism, hypertension, hyperlipidemia, COPD on 2 L of oxygen, history of CVA, grade 2 diastolic dysfunction and severe pulmonary hypertension who was admitted to the hospital with acute metabolic encephalopathy due to hyponatremia and hypertension. Patient'shospital course has been complicated byacute on chronic hypoxic and hypercapnic respiratory failure.   Call placed and discussed with son [name]. He remains invested in figuring out best possible home based health care, home physical therapy and arrangement for durable medical equipment for the patient.   Goals, at this point in time, are not palliative/comfort focused. Instead, at this time, goals remain for any and all efforts to be directed towards life maintenance and prolongation. No additional PMT specific interventions or recommendations at this time.   25 minutes spent Rosalin HawkingZeba Slayter Moorhouse MD [facility] Team [phone]

## 2019-01-07 NOTE — Progress Notes (Signed)
Physical Therapy Treatment Patient Details Name: Tammy Fernandez MRN: [id number] DOB: Feb 01, 1924 Today's Date: 01/07/2019    History of Present Illness 85 year old with past medical history relevant for SIADH on Depakote cycling, hypothyroidism, hypertension, hyperlipidemia, COPD on 2 liters chronically, history of CVA, grade 2 diastolic dysfunction and severe pulmonary hypertension by echo on 12/20/2017, admitted with acute metabolic encephalopathy due to hyponatremia and hypertension.    PT Comments    Pt still with some confusion per family.  She is fearful of falling which impacts her ability to participate at times with PT.  She is pleasant, but needs cues to stay on task.  Con't to recommend SNF. If family declines, then recommend HHPT and w/c with removable arm rests.   Follow Up Recommendations  Home health PT;Supervision/Assistance - 24 hour;SNF     Equipment Recommendations  Wheelchair (measurements PT);Wheelchair cushion (measurements PT)(if pt goes home and not SNF)    Recommendations for Other Services       Precautions / Restrictions Precautions Precautions: Fall Restrictions Weight Bearing Restrictions: No    Mobility  Bed Mobility Overal bed mobility: Needs Assistance Bed Mobility: Supine to Sit     Supine to sit: HOB elevated;Mod assist;+2 for safety/equipment     General bed mobility comments: Pt fearful and states she may pass out cause she has seizures  Transfers Overall transfer level: Needs assistance   Transfers: Squat Pivot Transfers     Squat pivot transfers: Mod assist;+2 physical assistance     General transfer comment: Pt able to do squat pivot transfer to the R with 3 sitting breaks.  She was able to clear buttocks so no shearing.  Ambulation/Gait                 Stairs             Wheelchair Mobility    Modified Rankin (Stroke Patients Only)       Balance Overall balance assessment: Needs  assistance Sitting-balance support: Bilateral upper extremity supported;Feet supported Sitting balance-[name]: Fair Sitting balance - Comments: (fearful of falling)     Standing balance-[name]: Poor                              Cognition Arousal/Alertness: Awake/alert Behavior During Therapy: Anxious Overall Cognitive Status: Impaired/Different from baseline Area of Impairment: Orientation;Following commands;Safety/judgement;Problem solving                 Orientation Level: Disoriented to;Time;Situation     Following Commands: Follows one step commands consistently Safety/Judgement: Decreased awareness of deficits;Decreased awareness of safety   Problem Solving: Slow processing;Decreased initiation;Difficulty sequencing;Requires verbal cues;Requires tactile cues General Comments: cues to keep her focused      Exercises      General Comments        Pertinent Vitals/Pain Pain Assessment: Faces Faces Pain Scale: No hurt    Home Living                      Prior Function            PT Goals (current goals can now be found in the care plan section) Acute Rehab PT Goals Patient Stated Goal: to go home with HHPT  PT Goal Formulation: With patient Time For Goal Achievement: 01/11/19 Potential to Achieve Goals: Good Progress towards PT goals: Progressing toward goals    Frequency    Min 3X/week  PT Plan Current plan remains appropriate    Co-evaluation              AM-PAC PT "6 Clicks" Mobility   Outcome Measure  Help needed turning from your back to your side while in a flat bed without using bedrails?: A Lot Help needed moving from lying on your back to sitting on the side of a flat bed without using bedrails?: A Lot Help needed moving to and from a bed to a chair (including a wheelchair)?: A Lot Help needed standing up from a chair using your arms (e.g., wheelchair or bedside chair)?: A Lot Help needed to  walk in hospital room?: A Lot Help needed climbing 3-5 steps with a railing? : Total 6 Click Score: 11    End of Session Equipment Utilized During Treatment: Gait belt;Oxygen Activity Tolerance: Patient tolerated treatment well Patient left: in chair;with call bell/phone within reach;with chair alarm set Nurse Communication: Mobility status PT Visit Diagnosis: Difficulty in walking, not elsewhere classified (R26.2);Unsteadiness on feet (R26.81);Muscle weakness (generalized) (M62.81)     Time: 1400-1420 PT Time Calculation (min) (ACUTE ONLY): 20 min  Charges:  $Self Care/Home Management: 8-22                     Clydie Braun L. Katrinka Blazing, [place] Pager [phone] 01/07/2019    Enzo Montgomery 01/07/2019, 2:48 PM

## 2019-01-08 ENCOUNTER — Ambulatory Visit: Payer: Medicare Other | Admitting: Family Medicine

## 2019-01-08 DIAGNOSIS — L8990 Pressure ulcer of unspecified site, unspecified stage: Secondary | ICD-10-CM

## 2019-01-08 MED ORDER — AMLODIPINE BESYLATE 10 MG PO TABS: 10.0000 mg | ORAL_TABLET | Freq: Every day | ORAL | 0 refills | Status: DC

## 2019-01-08 MED ORDER — ALPRAZOLAM 1 MG PO TABS: 0.5000 mg | ORAL_TABLET | Freq: Two times a day (BID) | ORAL | Status: DC | PRN

## 2019-01-08 NOTE — Progress Notes (Signed)
Escorted off floor by [facility]/ family no complaints noted

## 2019-01-08 NOTE — Care Management Note (Addendum)
Case Management Note  Patient Details  Name: Tammy Fernandez MRN: [id number] Date of Birth: [birth year]  Subjective/Objective:                  Discharge planning  Action/Plan: Son has chosen to use [facility] for services, request and information faced to [facility]. [name] UNABLE TO TAKE UNTIL NEXT tUESDAY/ second choice was [facility] notifed unable to take/[facility]/will accept/ family is acceptable of this/will go home via ptar/ptar called at 100pm/ List for hhc agencies for medicare placed on shadow chart. Has o2 at home Will go home via ptar Expected Discharge Date:  01/08/19               Expected Discharge Plan:  Home w Home Health Services  In-House Referral:     Discharge planning Services  CM Consult  Post Acute Care Choice:  Home Health Choice offered to:  Adult Children  DME Arranged:    DME Agency:     HH Arranged:  RN, PT, OT, Nurse's Aide HH Agency:  [facility]  Status of Service:  Completed, signed off  If discussed at [employer] of Stay Meetings, dates discussed:    Additional Comments:  Golda Acre, RN 01/08/2019, 12:30 PM

## 2019-01-08 NOTE — Discharge Instructions (Signed)

## 2019-01-08 NOTE — Discharge Summary (Addendum)
Physician Discharge Summary  MALAISHA SILLIMAN ZOX:[id number] DOB: 20-May-1924  PCP: Myles Lipps, MD  Admit date: 12/27/2018 Discharge date: 01/08/2019  Recommendations for Outpatient Follow-up:  1. Dr. Koren Shiver, PCP in 1 week with repeat labs (CBC & BMP). 2. Recommend outpatient evaluation for OSA with sleep study. 3. May consider outpatient nephrology consultation and follow-up regarding management of SIADH. 4. Dr. Erick Blinks, [facility] 5. Recommend repeating chest x-ray in 4 weeks to ensure resolution of pneumonia findings.  Home Health: PT, OT, RN and aide Equipment/Devices: Patient was on home oxygen 2 L/min continuously via nasal cannula prior to admission, continue same.  Now patient will be on noninvasive ventilation at bedtime. Wheelchair (measurements PT);Wheelchair cushion (measurements PT)  Discharge Condition: Improved and stable CODE STATUS: Full. Diet recommendation: Heart healthy diet.  Diet consistency as per speech therapy recommendations as follows:  Diet recommendations: Dysphagia 3 (mechanical soft);Thin liquid Liquids provided via: Straw Medication Administration: Whole meds with puree Supervision: Full supervision/cueing for compensatory strategies Compensations: Slow rate;Small sips/bites;Other (Comment);Lingual sweep for clearance of pocketing(clear throat/cough intermittently, stop po if coughing or dyspneic) Postural Changes and/or Swallow Maneuvers: Seated upright 90 degrees;Upright 30-60 min after meal  Also recommended feeding supplement (boost/resource breeze) 1 container 3 times daily between meals.  Discharge Diagnoses:  Active Problems:   GERD (gastroesophageal reflux disease)   History of CVA (cerebrovascular accident)   Anxiety state   Acute on chronic respiratory failure with hypercapnia (HCC)   Essential hypertension   Hypothyroidism   Chronic diastolic CHF (congestive heart failure) (HCC)   Severe pulmonary arterial systolic  hypertension (HCC)   CAD (coronary artery disease)   Hypochloremic alkalosis   Acute metabolic encephalopathy   Pressure injury of skin   Brief Summary: Patient is a 85 year old female with past medical history relevant for SIADH on demeclocycline, hypothyroidism, hypertension, hyperlipidemia, COPD on 2 L of oxygen, history of CVA, grade 2 diastolic dysfunction and severe pulmonary hypertension who was admitted to the hospital with acute metabolic encephalopathy due to hyponatremia and hypertension. Patient'shospital course has been complicated byacute on chronic hypoxic and hypercapnic respiratory failure.  Clinically improved and approaching stability for discharge pending arrangement of home health and DME.   Assessment/Plan:  Acute on chronic hyponatremia/SIADH:Sodium level was low at 121 on admission. Patient was tried on tolvaptan. Nephrology consult appreciated.  Hyponatremia resolved. Home demeclocycline discontinued at this time.  Close follow-up outpatient with repeat BMP.  May consider outpatient nephrology consultation and follow-up.  Acute onchronic hypoxic and hypercapnic respiratory failure:Patientwason 2 L oxygen at home. She has severe pulmonary artery hypertension. Baseline PCO2 level isaround 80. ABGs havesubsequently showed persistent hypercapnia with adequate metabolic compensation.In the hospital, patient was kept on nightlyBiPAP andoxygen via nasal cannuladuring the day. Currently at 2 L/min. Patient remains full code. Post discharge, patient will continue 24/7 oxygen via nasal cannula.   Meets criteria for home noninvasive ventilation pending outpatient sleep study for evaluation of OSA.  Case management and home health services have coordinated to arrange this.  As per [facility], due to lack of sleep study, patient's insurance would not cover home CPAP/BiPAP but would qualify for NIV/Trilogy pending sleep study.  Patient has chronic hypercapnia and  hypoxia associated with chronic respiratory failure secondary to severe COPD.  Patient needs to use NIV both at bedtime and as needed during daytime during periods of exacerbation.  NIV will treat patient's hypercapnia and can reduce risk of exacerbations and future hospitalizations when used as above.  She will need  these advanced settings in conjunction with her current medication regimen.  BiPAP is not an option due to its functional limitations and the severity of the patient's condition.  Failure to have NIV available for use over a 24-hour period could lead to decline and death.  Patient is able to protect her airway and clear her secretions by herself.  Acute metabolic encephalopathy-likely related to dementia and hospital-acquired delirium. Currently patient does not have any infection or metabolic abnormality otherwise. Her mental status has been fluctuating in the hospital with good days and bad days. It is anticipated that once he is discharged to home at her family environment, her mental status will stabilize.  Chronic recurrent aspiration-She was seen by speech and currently on dysphagia 3 diet. Patient was evaluated by GI. However because of herunstable mental status and advanced age,patient is currently not stable enough to undergo EGD.Patient will follow-up with GI as an outpatient to determine if she can undergo EGD at that time.  Dysphagia/Presbyesophagus: Modified diet as above per ST and outpatient follow-up with GI for further evaluation as deemed necessary.  Left lower lobe pneumonia:Suspect recurrent aspiration pneumonia. Started on antibiotics. Repeat chest x-ray on 1/30 did not show any worsening. Continue aggressive chest physiotherapy and bronchodilator therapy. Completed Augmentin on 2/5.  Improved and stable.  Recommend follow-up chest x-ray in 4 weeks to ensure resolution of pneumonia findings.  Hypothyroidism:Clinically euthyroid on current regimen.    Accelerated hypertension:Presented with accelerated hypertension.    Reasonably controlled now on amlodipine and propranolol.  History of stroke:Continue aspirin. No evidence of new stroke.  Chronic diastolic heart failure:Compensated.  Not on diuretics PTA.  GERD: On PPI and Pepcid.  Advanced directives-palliative care consult appreciated.Prior [facility] MD had long conversation with patient'sfamily members including her son [name] who is a power of attorney. Despite all the explanations and counseling, POA wants to maintain patient afull code and also wants procedures like EGD if needed.  As per PMT follow-up, continue full scope of treatment at this time.   Consultants:  Pulmonology  Palliative care  Nephrology  [facility]  Procedures: None  Discharge Instructions  Discharge Instructions    Call MD for:   Complete by:  As directed    Recurrent worsening confusion or altered mental status.   Call MD for:  difficulty breathing, headache or visual disturbances   Complete by:  As directed    Call MD for:  extreme fatigue   Complete by:  As directed    Call MD for:  persistant dizziness or light-headedness   Complete by:  As directed    Call MD for:  persistant nausea and vomiting   Complete by:  As directed    Call MD for:  severe uncontrolled pain   Complete by:  As directed    Call MD for:  temperature >100.4   Complete by:  As directed    Diet - low sodium heart healthy   Complete by:  As directed    Discharge instructions   Complete by:  As directed    1) Diet consistency as per speech therapy recommendations as follows:  Diet recommendations: Dysphagia 3 (mechanical soft);Thin liquid Liquids provided via: Straw Medication Administration: Whole meds with puree Supervision: Full supervision/cueing for compensatory strategies Compensations: Slow rate;Small sips/bites;Other (Comment);Lingual sweep for clearance of pocketing(clear throat/cough  intermittently, stop po if coughing or dyspneic) Postural Changes and/or Swallow Maneuvers: Seated upright 90 degrees;Upright 30-60 min after meal  2) continue oxygen via nasal cannula at 2  L/min continuously during the daytime and noninvasive ventilation at bedtime.   Increase activity slowly   Complete by:  As directed        Medication List    STOP taking these medications   azelastine 0.1 % nasal spray Commonly known as:  ASTELIN   demeclocycline 300 MG tablet Commonly known as:  DECLOMYCIN   fluconazole 150 MG tablet Commonly known as:  DIFLUCAN   lisinopril 5 MG tablet Commonly known as:  PRINIVIL,ZESTRIL   pantoprazole 40 MG tablet Commonly known as:  PROTONIX   ranitidine 300 MG tablet Commonly known as:  ZANTAC     TAKE these medications   ADULT SUPPOSITORY RE Place 1 Dose rectally as needed (costipation).   albuterol (2.5 MG/3ML) 0.083% nebulizer solution Commonly known as:  PROVENTIL Take 3 mLs (2.5 mg total) by nebulization every 4 (four) hours as needed for wheezing or shortness of breath.   ALPRAZolam 1 MG tablet Commonly known as:  XANAX Take 0.5 tablets (0.5 mg total) by mouth 2 (two) times daily as needed for anxiety. What changed:    how much to take  when to take this  reasons to take this   amLODipine 10 MG tablet Commonly known as:  NORVASC Take 1 tablet (10 mg total) by mouth daily. What changed:    medication strength  how much to take   Aspirin 81 MG EC tablet Take 81 mg by mouth daily.   cetirizine 10 MG tablet Commonly known as:  ZYRTEC Take 10 mg by mouth daily.   docusate sodium 100 MG capsule Commonly known as:  COLACE Take 100 mg by mouth daily as needed for mild constipation.   famotidine 20 MG tablet Commonly known as:  PEPCID Take 1 tablet (20 mg total) by mouth daily. Take 1 tablet at Bedtime daily.   fluticasone 50 MCG/ACT nasal spray Commonly known as:  FLONASE Place 1 spray into both nostrils 2 (two)  times daily.   levothyroxine 50 MCG tablet Commonly known as:  SYNTHROID, LEVOTHROID Take 1 tablet (50 mcg total) by mouth daily before breakfast.   meclizine 12.5 MG tablet Commonly known as:  ANTIVERT Take 1 tablet by mouth as needed for dizziness   olopatadine 0.1 % ophthalmic solution Commonly known as:  PATANOL Place 1 drop into both eyes daily as needed for allergies.   omeprazole 20 MG capsule Commonly known as:  PRILOSEC Take 20 mg by mouth every evening.   propranolol ER 60 MG 24 hr capsule Commonly known as:  INDERAL LA TAKE 1 CAPSULE BY MOUTH DAILY      Follow-up Information    Myles Lipps, MD. Schedule an appointment as soon as possible for a visit in 1 week(s).   Specialty:  Family Medicine Why:  To be seen with repeat labs (CBC & BMP). Contact information: [address] [place] [address] [phone]        Pyrtle, Carie Caddy, MD. Schedule an appointment as soon as possible for a visit.   Specialty:  Gastroenterology Contact information: [address] [place] [address] [phone]          Allergies  Allergen Reactions  . Crestor [Rosuvastatin Calcium] Other (See Comments)    Muscle Aches  . Keflex [Cephalexin] Other (See Comments)    dizziness  . Peanut Butter Flavor Hives  . Sulfa Antibiotics Hives  . Cephalexin Other (See Comments)    dizziness  . Sulfa Drugs Cross Reactors Rash  Procedures/Studies: Dg Chest 2 View  Result Date: 12/31/2018 CLINICAL DATA:  85 year old female with a history of acute hypoxic respiratory failure. EXAM: CHEST - 2 VIEW COMPARISON:  CT 12/27/2018, plain film 12/27/2018, 06/10/2018, 12/28/2017, 12/24/2017, CT 12/21/2017 FINDINGS: Significantly limited chest x-ray given the patient's positioning and body habitus. Overall, the cardiomediastinal silhouette is unchanged compared to the prior plain films. There is persistent elevation of the right hemidiaphragm. This is more evident on the lateral  view. On the lateral view there is increasing opacity at the posterior aspect of the right hemidiaphragm, which corresponds to the finding on recent CT of consolidation/volume loss at the right lung base. No new large pleural effusion or pneumothorax. IMPRESSION: Limited plain film demonstrates persistent low lung volumes, crowded central vessels, and right hemidiaphragm elevation. Opacity at the right lung base corresponds to findings of right lower lobe consolidation/atelectasis on prior CT. Electronically Signed   By: Gilmer Mor D.O.   On: 12/31/2018 13:51   Dg Chest 2 View  Result Date: 12/27/2018 CLINICAL DATA:  85 year old female with a history of feet swelling and cough EXAM: CHEST - 2 VIEW COMPARISON:  Multiple prior chest x-ray most recent 06/10/2018, CT 12/21/2017 FINDINGS: Cardiomediastinal silhouette unchanged. Similar appearance of significant elevation of the right hemidiaphragm which obscures the right heart border. Patchy airspace opacity in the right suprahilar region along the midline. No pneumothorax.  No large pleural effusion. Degenerative changes of the shoulders. Degenerative changes of the spine IMPRESSION: Patchy airspace in the right suprahilar region concerning for pneumonia versus chronic atelectasis/scarring. Unchanged right hemidiaphragm chronic elevation with obscuration the right heart border. Electronically Signed   By: Gilmer Mor D.O.   On: 12/27/2018 14:23   Ct Head Wo Contrast  Result Date: 01/01/2019 CLINICAL DATA:  85 y/o  F; unresponsive, hypotensive, disoriented. EXAM: CT HEAD WITHOUT CONTRAST TECHNIQUE: Contiguous axial images were obtained from the base of the skull through the vertex without intravenous contrast. COMPARISON:  12/29/2018 CT head. FINDINGS: Brain: No evidence of acute infarction, hemorrhage, hydrocephalus, extra-axial collection or mass lesion/mass effect. Stable 4 mm cerebellar tonsillar ectopia with rounded appearance of the cerebellar  tonsils, probably normal variant. Stable chronic microvascular ischemic changes and volume loss of the brain. Vascular: Calcific atherosclerosis of the carotid siphons. No hyperdense vessel identified. Skull: Normal. Negative for fracture or focal lesion. Sinuses/Orbits: No acute finding. Bilateral intra-ocular lens replacement. Other: None. IMPRESSION: 1. No acute intracranial abnormality. 2. Stable chronic microvascular ischemic changes and volume loss of the brain. Electronically Signed   By: Mitzi Hansen M.D.   On: 01/01/2019 02:37   Ct Head Wo Contrast  Result Date: 12/29/2018 CLINICAL DATA:  Encephalopathy. EXAM: CT HEAD WITHOUT CONTRAST TECHNIQUE: Contiguous axial images were obtained from the base of the skull through the vertex without intravenous contrast. COMPARISON:  12/20/2017 FINDINGS: Brain: There is no evidence for acute hemorrhage, hydrocephalus, mass lesion, or abnormal extra-axial fluid collection. No definite CT evidence for acute infarction. Diffuse loss of parenchymal volume is consistent with atrophy. Patchy low attenuation in the deep hemispheric and periventricular white matter is nonspecific, but likely reflects chronic microvascular ischemic demyelination. Vascular: No hyperdense vessel or unexpected calcification. Skull: Normal. Negative for fracture or focal lesion. Sinuses/Orbits: No acute finding. Other: None. IMPRESSION: 1. No acute intracranial abnormality. 2. Atrophy with chronic small vessel white matter ischemic disease. Electronically Signed   By: Kennith Center M.D.   On: 12/29/2018 18:08   Ct Chest Wo Contrast  Result Date: 12/27/2018 CLINICAL DATA:  Cough for 2 weeks EXAM: CT CHEST WITHOUT CONTRAST TECHNIQUE: Multidetector CT imaging of the chest was performed following the standard protocol without IV contrast. COMPARISON:  12/21/2017 and chest radiograph from 12/27/2018 FINDINGS: Cardiovascular: Atherosclerotic calcification thoracic aorta and branch  vessels. Mediastinum/Nodes: Unremarkable Lungs/Pleura: Elevated right hemidiaphragm. Scarring posteriorly in the right upper lobe. Atelectasis of much of the right lower lobe attributable to the elevated right hemidiaphragm and similar to the 12/21/2017 exam. No pleural effusion. In the right suprahilar region there is some confluent vasculature and a calcified granuloma, but no airspace opacity is identified; the appearance on radiography is thought to be due to superimposed vascular shadows. Upper Abdomen: Abdominal aortic atherosclerosis. Musculoskeletal: Thoracic spondylosis. IMPRESSION: 1. The region of concern in the right suprahilar area on prior chest radiography is due to superimposed vessels. There is no worrisome airspace opacity in this region. 2. Elevated right hemidiaphragm with chronic atelectasis of the right lower lobe. 3.  Aortic Atherosclerosis (ICD10-I70.0). Electronically Signed   By: Gaylyn Rong M.D.   On: 12/27/2018 16:44   Dg Chest Port 1 View  Result Date: 01/02/2019 CLINICAL DATA:  Cough, shortness of breath. EXAM: PORTABLE CHEST 1 VIEW COMPARISON:  Radiograph of January 01, 2019. FINDINGS: Stable cardiomegaly. Atherosclerosis of thoracic aorta is noted. No pneumothorax is noted. Mild bibasilar subsegmental atelectasis is noted with small pleural effusions. Bony thorax is unremarkable. IMPRESSION: Mild bibasilar subsegmental atelectasis with small pleural effusions. Aortic Atherosclerosis (ICD10-I70.0). Electronically Signed   By: Lupita Raider, M.D.   On: 01/02/2019 10:41   Dg Chest Port 1 View  Result Date: 01/01/2019 CLINICAL DATA:  Respiratory distress EXAM: PORTABLE CHEST 1 VIEW COMPARISON:  12/31/2018, 12/29/2018 FINDINGS: Cardiomegaly with bilateral pleural effusions. Low lung volumes. Worsening airspace disease at the left base. Persistent airspace disease at the right base. Vascular congestion and probable pulmonary edema. Aortic atherosclerosis. Residual contrast  within high-riding colon within the right upper quadrant. IMPRESSION: Low lung volumes. Worsening airspace disease at the left lung base, atelectasis versus pneumonia. Probable small pleural effusions. Continued atelectasis/consolidation at right base. Enlarged cardiomediastinal silhouette with vascular congestion and probable mild hazy pulmonary edema Electronically Signed   By: Jasmine Pang M.D.   On: 01/01/2019 02:11   Dg Chest Port 1 View  Result Date: 12/29/2018 CLINICAL DATA:  Hypoxic episode. EXAM: PORTABLE CHEST 1 VIEW COMPARISON:  Chest x-ray and CT chest dated December 27, 2017. FINDINGS: Stable cardiomediastinal silhouette. Normal pulmonary vascularity. Atherosclerotic calcification of the aortic arch. Low lung volumes with unchanged elevation of the right hemidiaphragm. Chronic right lower lobe atelectasis. Mild left lower lobe atelectasis. No consolidation, pneumothorax, or large pleural effusion. No acute osseous abnormality. IMPRESSION: No active disease. Electronically Signed   By: Obie Dredge M.D.   On: 12/29/2018 17:34   Dg Swallowing Func-speech Pathology  Result Date: 12/30/2018 Objective Swallowing Evaluation: Type of Study: MBS-Modified Barium Swallow Study  Patient Details Name: ADILENE AREOLA MRN: [id number] Date of Birth: 09-Dec-1923 Today's Date: 12/30/2018 Time: SLP Start Time (ACUTE ONLY): 1006 -SLP Stop Time (ACUTE ONLY): 1027 SLP Time Calculation (min) (ACUTE ONLY): 21 min Past Medical History: Past Medical History: Diagnosis Date . Anxiety  . Chronic lower back pain  . Constipation   h/o fecal disimpaction on 2017 . CVA (cerebral vascular accident) (HCC) 2015  neg workup, MRI small acute lacunar infarcts. ASA only . Depression  . Diastolic CHF, chronic (HCC)   grade I, most recent echo Jan 2019 . Fatty liver  . GERD (gastroesophageal reflux  disease)  . Hyperlipidemia  . Hypertension  . Hyponatremia   multiple episodes with encephalopathy, renal thinks SIADH . Hypothyroidism,  unspecified  . Internal hemorrhoid  . NSTEMI (non-ST elevated myocardial infarction) (HCC) 08/28/2016 . Palpitations  . PFO with atrial septal aneurysm   TTE 2015 . Pneumonia 2016 . Severe pulmonary arterial systolic hypertension (HCC) 12/25/2017  per echo, on 1L oxgen via [place] Past Surgical History: Past Surgical History: Procedure Laterality Date . ABDOMINAL HYSTERECTOMY   . CATARACT EXTRACTION W/ INTRAOCULAR LENS  IMPLANT, BILATERAL Bilateral  . CESAREAN SECTION   . TEE WITHOUT CARDIOVERSION N/A 01/19/2014  Procedure: TRANSESOPHAGEAL ECHOCARDIOGRAM (TEE);  Surgeon: Laurey Morale, MD;  Location: [facility] ENDOSCOPY;  Service: Cardiovascular;  Laterality: N/A;  [name] . VAGINAL HYSTERECTOMY   HPI: 85 year old female admitted 12/27/2018 with cough, leg edema and mild confusion. PMH: SIADH on Depakote cycling, hypothyroidism, hypertension, hyperlipidemia, COPD on 2 liters chronically, history of CVA, grade 2 diastolic dysfunction and severe pulmonary hypertension by echo on 12/20/2017.  Pt was scheduled for OP MBS last week but did not come for testing - ? due to illness.  Per notes, pt reports problems with meats and breads.   Subjective: pt awake in flouro chair Assessment / Plan / Recommendation CHL IP CLINICAL IMPRESSIONS 12/30/2018 Clinical Impression Suboptimal view secondary to pt's shoulder elevated and blocking view at times.  Pt presents with mild oral dysphagia and overall functional pharyngeal swallow.  Decreased lingual strength/coordination results in decreased oral manipulation/cohesion and lingual pumping with premature spillage of boluses into pharynx.  Pharyngeal swallow largely intact with only mild laryngeal penetration.  Compensation strategies not tested due to pt's decreased ability to follow directions.  Pt did not aspirate despite being challenged to sequential boluses of thin.  Trace laryngeal penetration did not clear with cued cough/throat clear.  Pt appeared with secretions retained in pharynx.   Oral secretions removed by this SlP prior to barium administration.  Dependent on results of esophagram, recommend dys3/ground meats/extra gravy/sauces and thin liquids.  Medications with puree - whole.  SLP will follow up for family/pt education.  Suspect primary source of pt's dysphagia at this time is her mentation.   SLP Visit Diagnosis Dysphagia, oral phase (R13.11) Attention and concentration deficit following -- Frontal lobe and executive function deficit following -- Impact on safety and function Moderate aspiration risk   CHL IP TREATMENT RECOMMENDATION 12/30/2018 Treatment Recommendations Therapy as outlined in treatment plan below   Prognosis 12/30/2018 Prognosis for Safe Diet Advancement Fair Barriers to Reach Goals -- Barriers/Prognosis Comment -- CHL IP DIET RECOMMENDATION 12/30/2018 SLP Diet Recommendations Dysphagia 3 (Mech soft) solids;Thin liquid Liquid Administration via -- Medication Administration Whole meds with puree Compensations Slow rate;Small sips/bites;Other (Comment) Postural Changes --   CHL IP OTHER RECOMMENDATIONS 12/30/2018 Recommended Consults Other (Comment) Oral Care Recommendations Oral care before and after PO Other Recommendations Have oral suction available   CHL IP FOLLOW UP RECOMMENDATIONS 12/30/2018 Follow up Recommendations (No Data)   CHL IP FREQUENCY AND DURATION 12/30/2018 Speech Therapy Frequency (ACUTE ONLY) min 2x/week Treatment Duration 1 week      CHL IP ORAL PHASE 12/30/2018 Oral Phase Impaired Oral - Pudding Teaspoon -- Oral - Pudding Cup -- Oral - Honey Teaspoon -- Oral - Honey Cup -- Oral - Nectar Teaspoon -- Oral - Nectar Cup -- Oral - Nectar Straw Lingual pumping;Delayed oral transit;Premature spillage;Decreased bolus cohesion;Weak lingual manipulation Oral - Thin Teaspoon Lingual pumping;Delayed oral transit;Premature spillage;Decreased bolus cohesion;Weak lingual manipulation Oral -  Thin Cup -- Oral - Thin Straw Lingual pumping;Delayed oral transit;Premature  spillage;Decreased bolus cohesion;Weak lingual manipulation Oral - Puree Lingual pumping;Delayed oral transit;Weak lingual manipulation Oral - Mech Soft Lingual pumping;Delayed oral transit;Impaired mastication;Weak lingual manipulation Oral - Regular -- Oral - Multi-Consistency -- Oral - Pill -- Oral Phase - Comment pt benefited from verbal cues at times to swallow  CHL IP PHARYNGEAL PHASE 12/30/2018 Pharyngeal Phase Impaired Pharyngeal- Pudding Teaspoon -- Pharyngeal -- Pharyngeal- Pudding Cup -- Pharyngeal -- Pharyngeal- Honey Teaspoon -- Pharyngeal -- Pharyngeal- Honey Cup -- Pharyngeal -- Pharyngeal- Nectar Teaspoon -- Pharyngeal -- Pharyngeal- Nectar Cup -- Pharyngeal -- Pharyngeal- Nectar Straw WFL Pharyngeal -- Pharyngeal- Thin Teaspoon WFL Pharyngeal -- Pharyngeal- Thin Cup -- Pharyngeal -- Pharyngeal- Thin Straw Penetration/Aspiration during swallow Pharyngeal Material enters airway, remains ABOVE vocal cords and not ejected out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

## 2019-01-08 NOTE — Care Management Important Message (Signed)
Important Message  Patient Details  Name: Tammy Fernandez MRN: [id number] Date of Birth: 1924/01/14   Medicare Important Message Given:  Yes    [name], [name] 01/08/2019, 11:40 AMImportant Message  Patient Details  Name: Tammy Fernandez MRN: [id number] Date of Birth: 1924/01/14   Medicare Important Message Given:  Yes    [name], [name] 01/08/2019, 11:40 AM

## 2019-01-08 NOTE — Progress Notes (Signed)
    Durable Medical Equipment  (From admission, onward)         Start     Ordered   01/08/19 1406  For home use only DME standard manual wheelchair with seat cushion  Once    Comments:  Patient suffers from copd which impairs their ability to perform daily activities like activites of daily living in the home.  A cane or walker will not resolve issue with performing activities of daily living. A wheelchair will allow patient to safely perform daily activities. Patient can safely propel the wheelchair in the home or has a caregiver who can provide assistance.  Accessories: elevating leg rests (ELRs), wheel locks, extensions and anti-tippers.   01/08/19 1409

## 2019-01-09 ENCOUNTER — Telehealth: Payer: Self-pay | Admitting: Family Medicine

## 2019-01-09 DIAGNOSIS — I110 Hypertensive heart disease with heart failure: Secondary | ICD-10-CM | POA: Diagnosis not present

## 2019-01-09 DIAGNOSIS — J9611 Chronic respiratory failure with hypoxia: Secondary | ICD-10-CM | POA: Diagnosis not present

## 2019-01-09 DIAGNOSIS — I272 Pulmonary hypertension, unspecified: Secondary | ICD-10-CM | POA: Diagnosis not present

## 2019-01-09 DIAGNOSIS — R1310 Dysphagia, unspecified: Secondary | ICD-10-CM | POA: Diagnosis not present

## 2019-01-09 DIAGNOSIS — J449 Chronic obstructive pulmonary disease, unspecified: Secondary | ICD-10-CM | POA: Diagnosis not present

## 2019-01-09 DIAGNOSIS — E039 Hypothyroidism, unspecified: Secondary | ICD-10-CM | POA: Diagnosis not present

## 2019-01-09 DIAGNOSIS — J9612 Chronic respiratory failure with hypercapnia: Secondary | ICD-10-CM | POA: Diagnosis not present

## 2019-01-09 DIAGNOSIS — Z87891 Personal history of nicotine dependence: Secondary | ICD-10-CM | POA: Diagnosis not present

## 2019-01-09 DIAGNOSIS — Z87440 Personal history of urinary (tract) infections: Secondary | ICD-10-CM | POA: Diagnosis not present

## 2019-01-09 DIAGNOSIS — E222 Syndrome of inappropriate secretion of antidiuretic hormone: Secondary | ICD-10-CM | POA: Diagnosis not present

## 2019-01-09 DIAGNOSIS — Z7951 Long term (current) use of inhaled steroids: Secondary | ICD-10-CM | POA: Diagnosis not present

## 2019-01-09 DIAGNOSIS — Z7982 Long term (current) use of aspirin: Secondary | ICD-10-CM | POA: Diagnosis not present

## 2019-01-09 DIAGNOSIS — I2510 Atherosclerotic heart disease of native coronary artery without angina pectoris: Secondary | ICD-10-CM | POA: Diagnosis not present

## 2019-01-09 DIAGNOSIS — Z7401 Bed confinement status: Secondary | ICD-10-CM | POA: Diagnosis not present

## 2019-01-09 DIAGNOSIS — Z9981 Dependence on supplemental oxygen: Secondary | ICD-10-CM | POA: Diagnosis not present

## 2019-01-09 DIAGNOSIS — I69354 Hemiplegia and hemiparesis following cerebral infarction affecting left non-dominant side: Secondary | ICD-10-CM | POA: Diagnosis not present

## 2019-01-09 DIAGNOSIS — L89312 Pressure ulcer of right buttock, stage 2: Secondary | ICD-10-CM | POA: Diagnosis not present

## 2019-01-09 DIAGNOSIS — I69391 Dysphagia following cerebral infarction: Secondary | ICD-10-CM | POA: Diagnosis not present

## 2019-01-09 DIAGNOSIS — D649 Anemia, unspecified: Secondary | ICD-10-CM

## 2019-01-09 DIAGNOSIS — I5032 Chronic diastolic (congestive) heart failure: Secondary | ICD-10-CM | POA: Diagnosis not present

## 2019-01-09 NOTE — Telephone Encounter (Signed)
Copied from CRM [id number]. Topic: Quick Communication - See Telephone Encounter >> Jan 09, 2019  8:39 AM Jolayne Haines L wrote: CRM for notification. See Telephone encounter for: 01/09/19.  Patient's son, Shonna Chock called and said she was in the hospital recently and the hospital doctor took her off of demeclocycline (DECLOMYCIN) tablet 300 mg  and he does not understand why because he said if she does not take his medication her sodium drops. Also, she was taken off her stomach medication ranitidine (ZANTAC) 300 MG tablet [[id number]] and he said she needs this. He would like a call back @ [phone]

## 2019-01-10 DIAGNOSIS — Z7982 Long term (current) use of aspirin: Secondary | ICD-10-CM | POA: Diagnosis not present

## 2019-01-10 DIAGNOSIS — I69391 Dysphagia following cerebral infarction: Secondary | ICD-10-CM | POA: Diagnosis not present

## 2019-01-10 DIAGNOSIS — I272 Pulmonary hypertension, unspecified: Secondary | ICD-10-CM | POA: Diagnosis not present

## 2019-01-10 DIAGNOSIS — Z87440 Personal history of urinary (tract) infections: Secondary | ICD-10-CM | POA: Diagnosis not present

## 2019-01-10 DIAGNOSIS — J449 Chronic obstructive pulmonary disease, unspecified: Secondary | ICD-10-CM | POA: Diagnosis not present

## 2019-01-10 DIAGNOSIS — J9612 Chronic respiratory failure with hypercapnia: Secondary | ICD-10-CM | POA: Diagnosis not present

## 2019-01-10 DIAGNOSIS — I69354 Hemiplegia and hemiparesis following cerebral infarction affecting left non-dominant side: Secondary | ICD-10-CM | POA: Diagnosis not present

## 2019-01-10 DIAGNOSIS — L89312 Pressure ulcer of right buttock, stage 2: Secondary | ICD-10-CM | POA: Diagnosis not present

## 2019-01-10 DIAGNOSIS — E039 Hypothyroidism, unspecified: Secondary | ICD-10-CM | POA: Diagnosis not present

## 2019-01-10 DIAGNOSIS — Z9981 Dependence on supplemental oxygen: Secondary | ICD-10-CM | POA: Diagnosis not present

## 2019-01-10 DIAGNOSIS — Z7401 Bed confinement status: Secondary | ICD-10-CM | POA: Diagnosis not present

## 2019-01-10 DIAGNOSIS — I110 Hypertensive heart disease with heart failure: Secondary | ICD-10-CM | POA: Diagnosis not present

## 2019-01-10 DIAGNOSIS — Z7951 Long term (current) use of inhaled steroids: Secondary | ICD-10-CM | POA: Diagnosis not present

## 2019-01-10 DIAGNOSIS — Z87891 Personal history of nicotine dependence: Secondary | ICD-10-CM | POA: Diagnosis not present

## 2019-01-10 DIAGNOSIS — J9611 Chronic respiratory failure with hypoxia: Secondary | ICD-10-CM | POA: Diagnosis not present

## 2019-01-10 DIAGNOSIS — R1310 Dysphagia, unspecified: Secondary | ICD-10-CM | POA: Diagnosis not present

## 2019-01-10 DIAGNOSIS — I5032 Chronic diastolic (congestive) heart failure: Secondary | ICD-10-CM | POA: Diagnosis not present

## 2019-01-10 DIAGNOSIS — E222 Syndrome of inappropriate secretion of antidiuretic hormone: Secondary | ICD-10-CM | POA: Diagnosis not present

## 2019-01-10 DIAGNOSIS — I2510 Atherosclerotic heart disease of native coronary artery without angina pectoris: Secondary | ICD-10-CM | POA: Diagnosis not present

## 2019-01-10 NOTE — Telephone Encounter (Signed)
Please advise 

## 2019-01-12 ENCOUNTER — Other Ambulatory Visit: Payer: Self-pay

## 2019-01-12 ENCOUNTER — Other Ambulatory Visit: Payer: Self-pay | Admitting: Family Medicine

## 2019-01-12 ENCOUNTER — Telehealth: Payer: Self-pay | Admitting: Family Medicine

## 2019-01-12 DIAGNOSIS — J441 Chronic obstructive pulmonary disease with (acute) exacerbation: Secondary | ICD-10-CM | POA: Diagnosis not present

## 2019-01-12 DIAGNOSIS — R7881 Bacteremia: Secondary | ICD-10-CM | POA: Diagnosis not present

## 2019-01-12 DIAGNOSIS — B9562 Methicillin resistant Staphylococcus aureus infection as the cause of diseases classified elsewhere: Secondary | ICD-10-CM | POA: Diagnosis not present

## 2019-01-12 DIAGNOSIS — I6789 Other cerebrovascular disease: Secondary | ICD-10-CM | POA: Diagnosis not present

## 2019-01-12 DIAGNOSIS — J9601 Acute respiratory failure with hypoxia: Secondary | ICD-10-CM | POA: Diagnosis not present
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
## 2019-01-12 NOTE — Telephone Encounter (Signed)
Copied from CRM [id number]. Topic: Quick Communication - Home Health Verbal Orders >> Jan 12, 2019  8:49 AM Jens Som A wrote: Caller/Agency: [facility]bra Sarks Callback Number: [phone] to leave VM on this number Requesting OT/PT/Skilled Nursing/Social Work: PT Frequency: 1 week 1, 2week 3, 1x for 1 week 1-effectifve 01/10/19

## 2019-01-12 NOTE — Patient Outreach (Signed)
[facility]) Care Management  01/12/2019  Tammy Fernandez 04-19-1924 [id number]  EMMI: general discharge red alert Referral date: 01/22/19 Referral reason: unfilled prescriptions: yes,   Able to fill today/ tomorrow: no Insurance: [employer] care  Received return call from [place] nurse with Dr. Adela Glimpse office. She states she will have prescription for amlodipine 10 mg sent in to patients pharmacy.  Nurse request patient and/ or caregiver schedule patient post hospital discharge follow up with Dr. Leretha Pol as soon as possible.  RNCM contacted patients son, [name]. Informed son that patients prescription for Amlodipine will be called in to the pharmacy by the primary MD office. RNCM advised son to scheduled patients post hospital discharge follow up with primary MD within 10 days.  Son verbalized understanding.   PLAN; RNCM will follow up with son within 4 days to confirm receipt of prescription.   George Ina RN,BSN,CCM [facility] Telephonic  [phone]

## 2019-01-12 NOTE — Telephone Encounter (Signed)
Please advise amlodipine was refilled by Dr. Sherre Lain on 01/08/2019.

## 2019-01-12 NOTE — Telephone Encounter (Unsigned)
Copied from CRM [id number]. Topic: Quick Communication - Rx Refill/Question >> Jan 12, 2019 11:56 AM Gaynelle Adu wrote: Medication: amLODipine (NORVASC) 10 MG tablet  Has the patient contacted their pharmacy? Yes   Preferred Pharmacy (with phone number or street name):[id number] - [place], [place] - [address] [facility] [address] [facility] [address] [phone] (Phone) [phone] (Fax)   Patient was discharge from ED and she is completley out of the medication. Her nurse George Ina- [facility] is calling this request in

## 2019-01-12 NOTE — Telephone Encounter (Signed)
Verbal given over phone.

## 2019-01-12 NOTE — Telephone Encounter (Signed)
Copied from CRM [id number]. Topic: Quick Communication - Home Health Verbal Orders >> Jan 12, 2019  8:46 AM Jens Som A wrote: Caller/Agency: [facility]bra Sarks Callback Number: [phone] to leave VM on this number Requesting OT/PT/Skilled Nursing/Social Work: PT Frequency: 1 week 1, 2week 3, 1x for 1 week 1-effectifve 01/10/19
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
## 2019-01-13 ENCOUNTER — Telehealth: Payer: Self-pay | Admitting: Family Medicine

## 2019-01-13 ENCOUNTER — Other Ambulatory Visit: Payer: Self-pay

## 2019-01-13 DIAGNOSIS — I69354 Hemiplegia and hemiparesis following cerebral infarction affecting left non-dominant side: Secondary | ICD-10-CM | POA: Diagnosis not present

## 2019-01-13 DIAGNOSIS — Z87440 Personal history of urinary (tract) infections: Secondary | ICD-10-CM | POA: Diagnosis not present

## 2019-01-13 DIAGNOSIS — I5032 Chronic diastolic (congestive) heart failure: Secondary | ICD-10-CM | POA: Diagnosis not present

## 2019-01-13 DIAGNOSIS — J449 Chronic obstructive pulmonary disease, unspecified: Secondary | ICD-10-CM | POA: Diagnosis not present

## 2019-01-13 DIAGNOSIS — Z9981 Dependence on supplemental oxygen: Secondary | ICD-10-CM | POA: Diagnosis not present

## 2019-01-13 DIAGNOSIS — Z7982 Long term (current) use of aspirin: Secondary | ICD-10-CM | POA: Diagnosis not present

## 2019-01-13 DIAGNOSIS — I110 Hypertensive heart disease with heart failure: Secondary | ICD-10-CM | POA: Diagnosis not present

## 2019-01-13 DIAGNOSIS — I69391 Dysphagia following cerebral infarction: Secondary | ICD-10-CM | POA: Diagnosis not present

## 2019-01-13 DIAGNOSIS — L89312 Pressure ulcer of right buttock, stage 2: Secondary | ICD-10-CM | POA: Diagnosis not present

## 2019-01-13 DIAGNOSIS — Z87891 Personal history of nicotine dependence: Secondary | ICD-10-CM | POA: Diagnosis not present

## 2019-01-13 DIAGNOSIS — Z7951 Long term (current) use of inhaled steroids: Secondary | ICD-10-CM | POA: Diagnosis not present

## 2019-01-13 DIAGNOSIS — Z7401 Bed confinement status: Secondary | ICD-10-CM | POA: Diagnosis not present

## 2019-01-13 DIAGNOSIS — E039 Hypothyroidism, unspecified: Secondary | ICD-10-CM | POA: Diagnosis not present

## 2019-01-13 DIAGNOSIS — E222 Syndrome of inappropriate secretion of antidiuretic hormone: Secondary | ICD-10-CM | POA: Diagnosis not present

## 2019-01-13 DIAGNOSIS — J9612 Chronic respiratory failure with hypercapnia: Secondary | ICD-10-CM | POA: Diagnosis not present

## 2019-01-13 DIAGNOSIS — I2510 Atherosclerotic heart disease of native coronary artery without angina pectoris: Secondary | ICD-10-CM | POA: Diagnosis not present

## 2019-01-13 DIAGNOSIS — J9611 Chronic respiratory failure with hypoxia: Secondary | ICD-10-CM | POA: Diagnosis not present

## 2019-01-13 DIAGNOSIS — R1310 Dysphagia, unspecified: Secondary | ICD-10-CM | POA: Diagnosis not present

## 2019-01-13 DIAGNOSIS — I272 Pulmonary hypertension, unspecified: Secondary | ICD-10-CM | POA: Diagnosis not present

## 2019-01-13 NOTE — Telephone Encounter (Signed)
Copied from CRM [id number]. Topic: General - Other >> Jan 13, 2019 11:31 AM Jilda Roche wrote: Reason for CRM: Son called stating that her BP has been running low, the home nurse was there today and it was better and advised her to drink more water, she also has not urinated in 2 days. Son is calling just as an FYI to Dr. Leretha Pol.  Best call back is [phone]

## 2019-01-13 NOTE — Patient Outreach (Signed)
[facility]) Care Management  01/13/2019  DAMBER BARWICK 1924-01-30 [id number]  EMMI:general discharge  And care coordination: Referral date:01/22/19 Referral reason:unfilled prescriptions: yes, Able to fill today/ tomorrow: no Insurance: [employer] health care  Telephone call to patient's son, Yasha Urena for follow up.  HIPAA verified with son for patient. Son states he did not get to call about patients medication yet. He states home health came out this morning. He reports patients blood pressure has been running lower than normal. He reports most recent blood pressure is 119/60.  Son states he is concerned whether he should give patient her blood pressure medication or not.  Patient states the nurse explained to him that patient may need to drink more fluids.  RNCM advised son to call patients primary MD office and discuss blood pressure concerns. Son verbalized understanding and appreciation of follow up call.  RNCM request son contact RNCM once blood pressure medication is obtained.  Son verbalized understanding.   PLAN: RNCm will close patient due to patient being assessed and having no further needs.  RNCM will send [facility] brochure /magnet to patient  George Ina RN,BSN,CCM [facility] Telephonic  [phone]

## 2019-01-14 ENCOUNTER — Other Ambulatory Visit: Payer: Self-pay

## 2019-01-14 DIAGNOSIS — E222 Syndrome of inappropriate secretion of antidiuretic hormone: Secondary | ICD-10-CM | POA: Diagnosis not present

## 2019-01-14 DIAGNOSIS — J449 Chronic obstructive pulmonary disease, unspecified: Secondary | ICD-10-CM | POA: Diagnosis not present

## 2019-01-14 DIAGNOSIS — I110 Hypertensive heart disease with heart failure: Secondary | ICD-10-CM | POA: Diagnosis not present

## 2019-01-14 DIAGNOSIS — Z7951 Long term (current) use of inhaled steroids: Secondary | ICD-10-CM | POA: Diagnosis not present

## 2019-01-14 DIAGNOSIS — Z87440 Personal history of urinary (tract) infections: Secondary | ICD-10-CM | POA: Diagnosis not present

## 2019-01-14 DIAGNOSIS — I2510 Atherosclerotic heart disease of native coronary artery without angina pectoris: Secondary | ICD-10-CM | POA: Diagnosis not present

## 2019-01-14 DIAGNOSIS — Z7982 Long term (current) use of aspirin: Secondary | ICD-10-CM | POA: Diagnosis not present

## 2019-01-14 DIAGNOSIS — L89312 Pressure ulcer of right buttock, stage 2: Secondary | ICD-10-CM | POA: Diagnosis not present

## 2019-01-14 DIAGNOSIS — Z7401 Bed confinement status: Secondary | ICD-10-CM | POA: Diagnosis not present

## 2019-01-14 DIAGNOSIS — I272 Pulmonary hypertension, unspecified: Secondary | ICD-10-CM | POA: Diagnosis not present

## 2019-01-14 DIAGNOSIS — I69354 Hemiplegia and hemiparesis following cerebral infarction affecting left non-dominant side: Secondary | ICD-10-CM | POA: Diagnosis not present

## 2019-01-14 DIAGNOSIS — I5032 Chronic diastolic (congestive) heart failure: Secondary | ICD-10-CM | POA: Diagnosis not present

## 2019-01-14 DIAGNOSIS — J9611 Chronic respiratory failure with hypoxia: Secondary | ICD-10-CM | POA: Diagnosis not present

## 2019-01-14 DIAGNOSIS — R1310 Dysphagia, unspecified: Secondary | ICD-10-CM | POA: Diagnosis not present

## 2019-01-14 DIAGNOSIS — Z9981 Dependence on supplemental oxygen: Secondary | ICD-10-CM | POA: Diagnosis not present

## 2019-01-14 DIAGNOSIS — I69391 Dysphagia following cerebral infarction: Secondary | ICD-10-CM | POA: Diagnosis not present

## 2019-01-14 DIAGNOSIS — Z87891 Personal history of nicotine dependence: Secondary | ICD-10-CM | POA: Diagnosis not present

## 2019-01-14 DIAGNOSIS — E039 Hypothyroidism, unspecified: Secondary | ICD-10-CM | POA: Diagnosis not present

## 2019-01-14 DIAGNOSIS — J9612 Chronic respiratory failure with hypercapnia: Secondary | ICD-10-CM | POA: Diagnosis not present

## 2019-01-14 NOTE — Patient Outreach (Signed)
[facility]) Care Management  01/14/2019  Tammy Fernandez Jan 31, 1924 [id number]  EMMI:general discharge  And care coordination: Referral date:01/22/19 Referral reason:unfilled prescriptions: yes, Able to fill today/ tomorrow: no Insurance: [place] health care  Received voice mail message from patients son, Tammy Fernandez stating patient received her prescription for amlodipine.    PLAN; RNCM will close patient due to being assessed and having no further needs.   George Ina RN,BSN,CCM [facility] Telephonic  [phone]

## 2019-01-15 ENCOUNTER — Ambulatory Visit: Payer: Self-pay | Admitting: *Deleted

## 2019-01-15 DIAGNOSIS — I69391 Dysphagia following cerebral infarction: Secondary | ICD-10-CM | POA: Diagnosis not present

## 2019-01-15 DIAGNOSIS — I2510 Atherosclerotic heart disease of native coronary artery without angina pectoris: Secondary | ICD-10-CM | POA: Diagnosis not present

## 2019-01-15 DIAGNOSIS — I110 Hypertensive heart disease with heart failure: Secondary | ICD-10-CM | POA: Diagnosis not present

## 2019-01-15 DIAGNOSIS — L89312 Pressure ulcer of right buttock, stage 2: Secondary | ICD-10-CM | POA: Diagnosis not present

## 2019-01-15 DIAGNOSIS — R1310 Dysphagia, unspecified: Secondary | ICD-10-CM | POA: Diagnosis not present

## 2019-01-15 DIAGNOSIS — J9612 Chronic respiratory failure with hypercapnia: Secondary | ICD-10-CM | POA: Diagnosis not present

## 2019-01-15 DIAGNOSIS — Z9981 Dependence on supplemental oxygen: Secondary | ICD-10-CM | POA: Diagnosis not present

## 2019-01-15 DIAGNOSIS — Z7401 Bed confinement status: Secondary | ICD-10-CM | POA: Diagnosis not present

## 2019-01-15 DIAGNOSIS — J449 Chronic obstructive pulmonary disease, unspecified: Secondary | ICD-10-CM | POA: Diagnosis not present

## 2019-01-15 DIAGNOSIS — E222 Syndrome of inappropriate secretion of antidiuretic hormone: Secondary | ICD-10-CM | POA: Diagnosis not present

## 2019-01-15 DIAGNOSIS — I69354 Hemiplegia and hemiparesis following cerebral infarction affecting left non-dominant side: Secondary | ICD-10-CM | POA: Diagnosis not present

## 2019-01-15 DIAGNOSIS — Z7951 Long term (current) use of inhaled steroids: Secondary | ICD-10-CM | POA: Diagnosis not present

## 2019-01-15 DIAGNOSIS — E039 Hypothyroidism, unspecified: Secondary | ICD-10-CM | POA: Diagnosis not present

## 2019-01-15 DIAGNOSIS — I272 Pulmonary hypertension, unspecified: Secondary | ICD-10-CM | POA: Diagnosis not present

## 2019-01-15 DIAGNOSIS — Z7982 Long term (current) use of aspirin: Secondary | ICD-10-CM | POA: Diagnosis not present

## 2019-01-15 DIAGNOSIS — Z87891 Personal history of nicotine dependence: Secondary | ICD-10-CM | POA: Diagnosis not present

## 2019-01-15 DIAGNOSIS — J9611 Chronic respiratory failure with hypoxia: Secondary | ICD-10-CM | POA: Diagnosis not present

## 2019-01-15 DIAGNOSIS — I5032 Chronic diastolic (congestive) heart failure: Secondary | ICD-10-CM | POA: Diagnosis not present

## 2019-01-15 DIAGNOSIS — Z87440 Personal history of urinary (tract) infections: Secondary | ICD-10-CM | POA: Diagnosis not present

## 2019-01-15 NOTE — Telephone Encounter (Signed)
BP is fluctuating-  Last checked-114/64. Son states he got higher BP yesterday. He is concerned about her medications and what to do. [name] states the home nurses and PT have been coming in and getting set up. I assured him that they will call the office with any concerns. I also - as the home nurse did- advised him to start recording BP to see how they are running. He was given perimeters of high and low BP and when to call.  Will send information to office- patient will need hospital follow up- he declines to schedule that now because he states his mother is very weak. I told him I would let office know that so maybe they can let him know when that needs to be done so this patient can have follow up as soon as able.  He also wants to know if the home nurse can draw labs to check sodium and CO2 levels since they were abnormal in the hospital.

## 2019-01-16 DIAGNOSIS — I110 Hypertensive heart disease with heart failure: Secondary | ICD-10-CM | POA: Diagnosis not present

## 2019-01-16 DIAGNOSIS — J449 Chronic obstructive pulmonary disease, unspecified: Secondary | ICD-10-CM | POA: Diagnosis not present

## 2019-01-16 DIAGNOSIS — Z87891 Personal history of nicotine dependence: Secondary | ICD-10-CM | POA: Diagnosis not present

## 2019-01-16 DIAGNOSIS — R1310 Dysphagia, unspecified: Secondary | ICD-10-CM | POA: Diagnosis not present

## 2019-01-16 DIAGNOSIS — I5032 Chronic diastolic (congestive) heart failure: Secondary | ICD-10-CM | POA: Diagnosis not present

## 2019-01-16 DIAGNOSIS — I2510 Atherosclerotic heart disease of native coronary artery without angina pectoris: Secondary | ICD-10-CM | POA: Diagnosis not present

## 2019-01-16 DIAGNOSIS — Z7951 Long term (current) use of inhaled steroids: Secondary | ICD-10-CM | POA: Diagnosis not present

## 2019-01-16 DIAGNOSIS — E222 Syndrome of inappropriate secretion of antidiuretic hormone: Secondary | ICD-10-CM | POA: Diagnosis not present

## 2019-01-16 DIAGNOSIS — Z87440 Personal history of urinary (tract) infections: Secondary | ICD-10-CM | POA: Diagnosis not present

## 2019-01-16 DIAGNOSIS — Z7982 Long term (current) use of aspirin: Secondary | ICD-10-CM | POA: Diagnosis not present

## 2019-01-16 DIAGNOSIS — I69391 Dysphagia following cerebral infarction: Secondary | ICD-10-CM | POA: Diagnosis not present

## 2019-01-16 DIAGNOSIS — J9612 Chronic respiratory failure with hypercapnia: Secondary | ICD-10-CM | POA: Diagnosis not present

## 2019-01-16 DIAGNOSIS — L89312 Pressure ulcer of right buttock, stage 2: Secondary | ICD-10-CM | POA: Diagnosis not present

## 2019-01-16 DIAGNOSIS — I69354 Hemiplegia and hemiparesis following cerebral infarction affecting left non-dominant side: Secondary | ICD-10-CM | POA: Diagnosis not present

## 2019-01-16 DIAGNOSIS — E039 Hypothyroidism, unspecified: Secondary | ICD-10-CM | POA: Diagnosis not present

## 2019-01-16 DIAGNOSIS — Z9981 Dependence on supplemental oxygen: Secondary | ICD-10-CM | POA: Diagnosis not present

## 2019-01-16 DIAGNOSIS — J9611 Chronic respiratory failure with hypoxia: Secondary | ICD-10-CM | POA: Diagnosis not present

## 2019-01-16 DIAGNOSIS — I272 Pulmonary hypertension, unspecified: Secondary | ICD-10-CM | POA: Diagnosis not present

## 2019-01-16 DIAGNOSIS — Z7401 Bed confinement status: Secondary | ICD-10-CM | POA: Diagnosis not present

## 2019-01-17 NOTE — Telephone Encounter (Signed)
This patient needs to be seen urgently.  Please let her son know that I can see her as a same day on Monday and then get her back in with Dr. Leretha Pol.  Also I have ordered the labs to be rechecked so if she is not too weak he can get her blood drawn before she comes in so we can decide what medications she needs right away.  She should be able to resume the zantac if that allows her to feel more comfortable. However she is on Pepcid which should help.  I will help to coordinate her follow up appointments on Monday.  Please notify the scheduler to give this patient two back to back slots because this will be a lengthy visit for hospital follow up in a 85year old.

## 2019-01-19 ENCOUNTER — Ambulatory Visit: Payer: Medicare Other | Admitting: Family Medicine

## 2019-01-19 DIAGNOSIS — E039 Hypothyroidism, unspecified: Secondary | ICD-10-CM | POA: Diagnosis not present

## 2019-01-19 DIAGNOSIS — L89312 Pressure ulcer of right buttock, stage 2: Secondary | ICD-10-CM | POA: Diagnosis not present

## 2019-01-19 DIAGNOSIS — Z7951 Long term (current) use of inhaled steroids: Secondary | ICD-10-CM | POA: Diagnosis not present

## 2019-01-19 DIAGNOSIS — J9611 Chronic respiratory failure with hypoxia: Secondary | ICD-10-CM | POA: Diagnosis not present

## 2019-01-19 DIAGNOSIS — I5032 Chronic diastolic (congestive) heart failure: Secondary | ICD-10-CM | POA: Diagnosis not present

## 2019-01-19 DIAGNOSIS — I2510 Atherosclerotic heart disease of native coronary artery without angina pectoris: Secondary | ICD-10-CM | POA: Diagnosis not present

## 2019-01-19 DIAGNOSIS — J9612 Chronic respiratory failure with hypercapnia: Secondary | ICD-10-CM | POA: Diagnosis not present

## 2019-01-19 DIAGNOSIS — Z87440 Personal history of urinary (tract) infections: Secondary | ICD-10-CM | POA: Diagnosis not present

## 2019-01-19 DIAGNOSIS — R1310 Dysphagia, unspecified: Secondary | ICD-10-CM | POA: Diagnosis not present

## 2019-01-19 DIAGNOSIS — Z7401 Bed confinement status: Secondary | ICD-10-CM | POA: Diagnosis not present

## 2019-01-19 DIAGNOSIS — I69391 Dysphagia following cerebral infarction: Secondary | ICD-10-CM | POA: Diagnosis not present

## 2019-01-19 DIAGNOSIS — I69354 Hemiplegia and hemiparesis following cerebral infarction affecting left non-dominant side: Secondary | ICD-10-CM | POA: Diagnosis not present

## 2019-01-19 DIAGNOSIS — I272 Pulmonary hypertension, unspecified: Secondary | ICD-10-CM | POA: Diagnosis not present

## 2019-01-19 DIAGNOSIS — E222 Syndrome of inappropriate secretion of antidiuretic hormone: Secondary | ICD-10-CM | POA: Diagnosis not present

## 2019-01-19 DIAGNOSIS — J449 Chronic obstructive pulmonary disease, unspecified: Secondary | ICD-10-CM | POA: Diagnosis not present

## 2019-01-19 DIAGNOSIS — I110 Hypertensive heart disease with heart failure: Secondary | ICD-10-CM | POA: Diagnosis not present

## 2019-01-19 DIAGNOSIS — Z9981 Dependence on supplemental oxygen: Secondary | ICD-10-CM | POA: Diagnosis not present

## 2019-01-19 DIAGNOSIS — Z7982 Long term (current) use of aspirin: Secondary | ICD-10-CM | POA: Diagnosis not present

## 2019-01-19 DIAGNOSIS — Z87891 Personal history of nicotine dependence: Secondary | ICD-10-CM | POA: Diagnosis not present

## 2019-01-19 NOTE — Telephone Encounter (Signed)
Dr. Creta Levin, Charline Bills spokw with pt's son and he is stating that there is no feasible way that his mom can come in for an OV right now. She is too weak. He states that he WANTS and NEEDS bloodwork done at the house through [facility]. They have sent people out to observe and then to do PT. Son states that the oxygen mask is only being able to be worn for 4 hours due to the pt not being used to the mask and she is having a hard time adjusting. Son is asking if we can have bloodwork done and monitor that way until he thinks she will be able to come in for an appt. He is thinking at least one week if not two weeks.   Please advise.

## 2019-01-20 ENCOUNTER — Other Ambulatory Visit: Payer: Self-pay

## 2019-01-20 DIAGNOSIS — E222 Syndrome of inappropriate secretion of antidiuretic hormone: Secondary | ICD-10-CM | POA: Diagnosis not present

## 2019-01-20 DIAGNOSIS — Z9981 Dependence on supplemental oxygen: Secondary | ICD-10-CM | POA: Diagnosis not present

## 2019-01-20 DIAGNOSIS — Z7982 Long term (current) use of aspirin: Secondary | ICD-10-CM | POA: Diagnosis not present

## 2019-01-20 DIAGNOSIS — L89312 Pressure ulcer of right buttock, stage 2: Secondary | ICD-10-CM | POA: Diagnosis not present

## 2019-01-20 DIAGNOSIS — Z7951 Long term (current) use of inhaled steroids: Secondary | ICD-10-CM | POA: Diagnosis not present

## 2019-01-20 DIAGNOSIS — Z87891 Personal history of nicotine dependence: Secondary | ICD-10-CM | POA: Diagnosis not present

## 2019-01-20 DIAGNOSIS — I272 Pulmonary hypertension, unspecified: Secondary | ICD-10-CM | POA: Diagnosis not present

## 2019-01-20 DIAGNOSIS — I69354 Hemiplegia and hemiparesis following cerebral infarction affecting left non-dominant side: Secondary | ICD-10-CM | POA: Diagnosis not present

## 2019-01-20 DIAGNOSIS — Z87440 Personal history of urinary (tract) infections: Secondary | ICD-10-CM | POA: Diagnosis not present

## 2019-01-20 DIAGNOSIS — E871 Hypo-osmolality and hyponatremia: Secondary | ICD-10-CM

## 2019-01-20 DIAGNOSIS — J9612 Chronic respiratory failure with hypercapnia: Secondary | ICD-10-CM | POA: Diagnosis not present

## 2019-01-20 DIAGNOSIS — I2510 Atherosclerotic heart disease of native coronary artery without angina pectoris: Secondary | ICD-10-CM | POA: Diagnosis not present

## 2019-01-20 DIAGNOSIS — J449 Chronic obstructive pulmonary disease, unspecified: Secondary | ICD-10-CM | POA: Diagnosis not present

## 2019-01-20 DIAGNOSIS — Z7401 Bed confinement status: Secondary | ICD-10-CM | POA: Diagnosis not present

## 2019-01-20 DIAGNOSIS — I69391 Dysphagia following cerebral infarction: Secondary | ICD-10-CM | POA: Diagnosis not present

## 2019-01-20 DIAGNOSIS — J9611 Chronic respiratory failure with hypoxia: Secondary | ICD-10-CM | POA: Diagnosis not present

## 2019-01-20 DIAGNOSIS — I5032 Chronic diastolic (congestive) heart failure: Secondary | ICD-10-CM | POA: Diagnosis not present

## 2019-01-20 DIAGNOSIS — R1310 Dysphagia, unspecified: Secondary | ICD-10-CM | POA: Diagnosis not present

## 2019-01-20 DIAGNOSIS — I110 Hypertensive heart disease with heart failure: Secondary | ICD-10-CM | POA: Diagnosis not present

## 2019-01-20 DIAGNOSIS — E039 Hypothyroidism, unspecified: Secondary | ICD-10-CM | POA: Diagnosis not present

## 2019-01-20 NOTE — Telephone Encounter (Signed)
Spoke to son.  States pt is going ok but still too physically weak to come into office.  States home health started and PT started.  Would like for home health to draw labs at home.  Call to [facility] to confirm they can draw labs.  Will be completed via [facility]. Spoke to Dr. Leretha Pol, ok to order BMP weekly x 4 for home health to draw.  Completed order and faxed to [facility].

## 2019-01-21 ENCOUNTER — Ambulatory Visit: Payer: Self-pay | Admitting: *Deleted

## 2019-01-21 NOTE — Telephone Encounter (Signed)
Copied from CRM [id number]. Topic: Quick Communication - Home Health Verbal Orders >> Jan 21, 2019 11:39 AM Trula Slade wrote: Caller/Agency:   Jeraldine Loots Speech Therapist w/[facility] Callback Number:  [phone] Requesting OT/PT/Skilled Nursing/Social Work:  Speech Therapy for swallowing difficulties Frequency:   2w3

## 2019-01-21 NOTE — Telephone Encounter (Signed)
Dr. Leretha Pol,   Call to [facility]  L/m to provide VO for Speech therapy 2/wk x 3 weeks.  See other messages.  Son called in earlier today advising triage nurse that patient not comfortable wearing CPAP at night and O2 dropping.    Please let me know if there is anything I can do to help.   Thank you,  Doyne Keel

## 2019-01-21 NOTE — Telephone Encounter (Signed)
Notified pt's son, [name], that orders were sent to [facility] to draw labs weekly; these were sent on 01/20/2019 to [facility]; also informed him that Dr Adela Glimpse office was made aware that the pt is not able to wear her CPAP; he verbalized understanding.

## 2019-01-21 NOTE — Telephone Encounter (Signed)
Pt called with complaints of her oxygen saturation of 89% when she puts her CPAP at night; her son [name] says that the pt will not wear the CPAP because it is uncomfortable; the pt uses 2.5 liters continuous oxygen; he also says that PT has been out once but the pt is still too weak to get out of bed; they would also like to have her CO and sodium levels; [facility] services the pt; the best contact number is [phone] (Jerome's cell phone), and a message can be left; spoke with [place], and she states that orders were sent to the home health agency and will be drawn weekly; will notify patient and family.

## 2019-01-21 NOTE — Telephone Encounter (Signed)
  Reason for Disposition . [1] MODERATE longstanding difficulty breathing (e.g., speaks in phrases, SOB even at rest, pulse 100-120) AND [2] SAME as normal  Protocols used: BREATHING DIFFICULTY-A-AH  

## 2019-01-22 ENCOUNTER — Telehealth: Payer: Self-pay | Admitting: Family Medicine

## 2019-01-22 DIAGNOSIS — I110 Hypertensive heart disease with heart failure: Secondary | ICD-10-CM | POA: Diagnosis not present

## 2019-01-22 DIAGNOSIS — I2510 Atherosclerotic heart disease of native coronary artery without angina pectoris: Secondary | ICD-10-CM | POA: Diagnosis not present

## 2019-01-22 DIAGNOSIS — E222 Syndrome of inappropriate secretion of antidiuretic hormone: Secondary | ICD-10-CM | POA: Diagnosis not present

## 2019-01-22 DIAGNOSIS — Z87891 Personal history of nicotine dependence: Secondary | ICD-10-CM | POA: Diagnosis not present

## 2019-01-22 DIAGNOSIS — E039 Hypothyroidism, unspecified: Secondary | ICD-10-CM | POA: Diagnosis not present

## 2019-01-22 DIAGNOSIS — Z9981 Dependence on supplemental oxygen: Secondary | ICD-10-CM | POA: Diagnosis not present

## 2019-01-22 DIAGNOSIS — Z7951 Long term (current) use of inhaled steroids: Secondary | ICD-10-CM | POA: Diagnosis not present

## 2019-01-22 DIAGNOSIS — Z7982 Long term (current) use of aspirin: Secondary | ICD-10-CM | POA: Diagnosis not present

## 2019-01-22 DIAGNOSIS — R1310 Dysphagia, unspecified: Secondary | ICD-10-CM | POA: Diagnosis not present

## 2019-01-22 DIAGNOSIS — J9611 Chronic respiratory failure with hypoxia: Secondary | ICD-10-CM | POA: Diagnosis not present

## 2019-01-22 DIAGNOSIS — I5032 Chronic diastolic (congestive) heart failure: Secondary | ICD-10-CM | POA: Diagnosis not present

## 2019-01-22 DIAGNOSIS — I69391 Dysphagia following cerebral infarction: Secondary | ICD-10-CM | POA: Diagnosis not present

## 2019-01-22 DIAGNOSIS — I69354 Hemiplegia and hemiparesis following cerebral infarction affecting left non-dominant side: Secondary | ICD-10-CM | POA: Diagnosis not present

## 2019-01-22 DIAGNOSIS — L89312 Pressure ulcer of right buttock, stage 2: Secondary | ICD-10-CM | POA: Diagnosis not present

## 2019-01-22 DIAGNOSIS — J9612 Chronic respiratory failure with hypercapnia: Secondary | ICD-10-CM | POA: Diagnosis not present

## 2019-01-22 DIAGNOSIS — I272 Pulmonary hypertension, unspecified: Secondary | ICD-10-CM | POA: Diagnosis not present

## 2019-01-22 DIAGNOSIS — Z7401 Bed confinement status: Secondary | ICD-10-CM | POA: Diagnosis not present

## 2019-01-22 DIAGNOSIS — Z87440 Personal history of urinary (tract) infections: Secondary | ICD-10-CM | POA: Diagnosis not present

## 2019-01-22 DIAGNOSIS — J449 Chronic obstructive pulmonary disease, unspecified: Secondary | ICD-10-CM | POA: Diagnosis not present

## 2019-01-22 NOTE — Telephone Encounter (Unsigned)
Copied from CRM [id number]. Topic: General - Other >> Jan 22, 2019 11:33 AM Percival Spanish wrote:  Misty Stanley with [facility] said pt son told her that Dr Leretha Pol wanted [facility] to order labs she would like a call back

## 2019-01-23 DIAGNOSIS — I110 Hypertensive heart disease with heart failure: Secondary | ICD-10-CM | POA: Diagnosis not present

## 2019-01-23 DIAGNOSIS — Z87440 Personal history of urinary (tract) infections: Secondary | ICD-10-CM | POA: Diagnosis not present

## 2019-01-23 DIAGNOSIS — Z7401 Bed confinement status: Secondary | ICD-10-CM | POA: Diagnosis not present

## 2019-01-23 DIAGNOSIS — Z7951 Long term (current) use of inhaled steroids: Secondary | ICD-10-CM | POA: Diagnosis not present

## 2019-01-23 DIAGNOSIS — J449 Chronic obstructive pulmonary disease, unspecified: Secondary | ICD-10-CM | POA: Diagnosis not present

## 2019-01-23 DIAGNOSIS — I69354 Hemiplegia and hemiparesis following cerebral infarction affecting left non-dominant side: Secondary | ICD-10-CM | POA: Diagnosis not present

## 2019-01-23 DIAGNOSIS — E222 Syndrome of inappropriate secretion of antidiuretic hormone: Secondary | ICD-10-CM | POA: Diagnosis not present

## 2019-01-23 DIAGNOSIS — Z9981 Dependence on supplemental oxygen: Secondary | ICD-10-CM | POA: Diagnosis not present

## 2019-01-23 DIAGNOSIS — I2510 Atherosclerotic heart disease of native coronary artery without angina pectoris: Secondary | ICD-10-CM | POA: Diagnosis not present

## 2019-01-23 DIAGNOSIS — J9612 Chronic respiratory failure with hypercapnia: Secondary | ICD-10-CM | POA: Diagnosis not present

## 2019-01-23 DIAGNOSIS — I5032 Chronic diastolic (congestive) heart failure: Secondary | ICD-10-CM | POA: Diagnosis not present

## 2019-01-23 DIAGNOSIS — E039 Hypothyroidism, unspecified: Secondary | ICD-10-CM | POA: Diagnosis not present

## 2019-01-23 DIAGNOSIS — J9611 Chronic respiratory failure with hypoxia: Secondary | ICD-10-CM | POA: Diagnosis not present

## 2019-01-23 DIAGNOSIS — Z87891 Personal history of nicotine dependence: Secondary | ICD-10-CM | POA: Diagnosis not present

## 2019-01-23 DIAGNOSIS — I69391 Dysphagia following cerebral infarction: Secondary | ICD-10-CM | POA: Diagnosis not present

## 2019-01-23 DIAGNOSIS — I272 Pulmonary hypertension, unspecified: Secondary | ICD-10-CM | POA: Diagnosis not present

## 2019-01-23 DIAGNOSIS — Z7982 Long term (current) use of aspirin: Secondary | ICD-10-CM | POA: Diagnosis not present

## 2019-01-23 DIAGNOSIS — R1310 Dysphagia, unspecified: Secondary | ICD-10-CM | POA: Diagnosis not present

## 2019-01-23 DIAGNOSIS — L89312 Pressure ulcer of right buttock, stage 2: Secondary | ICD-10-CM | POA: Diagnosis not present

## 2019-01-26 DIAGNOSIS — L89312 Pressure ulcer of right buttock, stage 2: Secondary | ICD-10-CM | POA: Diagnosis not present

## 2019-01-26 DIAGNOSIS — R1310 Dysphagia, unspecified: Secondary | ICD-10-CM | POA: Diagnosis not present

## 2019-01-26 DIAGNOSIS — I5032 Chronic diastolic (congestive) heart failure: Secondary | ICD-10-CM | POA: Diagnosis not present

## 2019-01-26 DIAGNOSIS — Z7951 Long term (current) use of inhaled steroids: Secondary | ICD-10-CM | POA: Diagnosis not present

## 2019-01-26 DIAGNOSIS — I69391 Dysphagia following cerebral infarction: Secondary | ICD-10-CM | POA: Diagnosis not present

## 2019-01-26 DIAGNOSIS — E039 Hypothyroidism, unspecified: Secondary | ICD-10-CM | POA: Diagnosis not present

## 2019-01-26 DIAGNOSIS — Z7401 Bed confinement status: Secondary | ICD-10-CM | POA: Diagnosis not present

## 2019-01-26 DIAGNOSIS — J9612 Chronic respiratory failure with hypercapnia: Secondary | ICD-10-CM | POA: Diagnosis not present

## 2019-01-26 DIAGNOSIS — J9611 Chronic respiratory failure with hypoxia: Secondary | ICD-10-CM | POA: Diagnosis not present

## 2019-01-26 DIAGNOSIS — Z87440 Personal history of urinary (tract) infections: Secondary | ICD-10-CM | POA: Diagnosis not present

## 2019-01-26 DIAGNOSIS — Z9981 Dependence on supplemental oxygen: Secondary | ICD-10-CM | POA: Diagnosis not present

## 2019-01-26 DIAGNOSIS — Z7982 Long term (current) use of aspirin: Secondary | ICD-10-CM | POA: Diagnosis not present

## 2019-01-26 DIAGNOSIS — Z87891 Personal history of nicotine dependence: Secondary | ICD-10-CM | POA: Diagnosis not present

## 2019-01-26 DIAGNOSIS — I2510 Atherosclerotic heart disease of native coronary artery without angina pectoris: Secondary | ICD-10-CM | POA: Diagnosis not present

## 2019-01-26 DIAGNOSIS — I110 Hypertensive heart disease with heart failure: Secondary | ICD-10-CM | POA: Diagnosis not present

## 2019-01-26 DIAGNOSIS — E222 Syndrome of inappropriate secretion of antidiuretic hormone: Secondary | ICD-10-CM | POA: Diagnosis not present

## 2019-01-26 DIAGNOSIS — I272 Pulmonary hypertension, unspecified: Secondary | ICD-10-CM | POA: Diagnosis not present

## 2019-01-26 DIAGNOSIS — I69354 Hemiplegia and hemiparesis following cerebral infarction affecting left non-dominant side: Secondary | ICD-10-CM | POA: Diagnosis not present

## 2019-01-26 DIAGNOSIS — J449 Chronic obstructive pulmonary disease, unspecified: Secondary | ICD-10-CM | POA: Diagnosis not present

## 2019-01-27 ENCOUNTER — Other Ambulatory Visit: Payer: Self-pay | Admitting: Family Medicine

## 2019-01-27 DIAGNOSIS — R1310 Dysphagia, unspecified: Secondary | ICD-10-CM | POA: Diagnosis not present

## 2019-01-27 DIAGNOSIS — Z7401 Bed confinement status: Secondary | ICD-10-CM | POA: Diagnosis not present

## 2019-01-27 DIAGNOSIS — Z87891 Personal history of nicotine dependence: Secondary | ICD-10-CM | POA: Diagnosis not present

## 2019-01-27 DIAGNOSIS — I5032 Chronic diastolic (congestive) heart failure: Secondary | ICD-10-CM | POA: Diagnosis not present

## 2019-01-27 DIAGNOSIS — I2510 Atherosclerotic heart disease of native coronary artery without angina pectoris: Secondary | ICD-10-CM | POA: Diagnosis not present

## 2019-01-27 DIAGNOSIS — J9612 Chronic respiratory failure with hypercapnia: Secondary | ICD-10-CM | POA: Diagnosis not present

## 2019-01-27 DIAGNOSIS — Z7982 Long term (current) use of aspirin: Secondary | ICD-10-CM | POA: Diagnosis not present

## 2019-01-27 DIAGNOSIS — I272 Pulmonary hypertension, unspecified: Secondary | ICD-10-CM | POA: Diagnosis not present

## 2019-01-27 DIAGNOSIS — I110 Hypertensive heart disease with heart failure: Secondary | ICD-10-CM | POA: Diagnosis not present

## 2019-01-27 DIAGNOSIS — I69391 Dysphagia following cerebral infarction: Secondary | ICD-10-CM | POA: Diagnosis not present

## 2019-01-27 DIAGNOSIS — Z7951 Long term (current) use of inhaled steroids: Secondary | ICD-10-CM | POA: Diagnosis not present

## 2019-01-27 DIAGNOSIS — E222 Syndrome of inappropriate secretion of antidiuretic hormone: Secondary | ICD-10-CM | POA: Diagnosis not present

## 2019-01-27 DIAGNOSIS — I69354 Hemiplegia and hemiparesis following cerebral infarction affecting left non-dominant side: Secondary | ICD-10-CM | POA: Diagnosis not present

## 2019-01-27 DIAGNOSIS — J9611 Chronic respiratory failure with hypoxia: Secondary | ICD-10-CM | POA: Diagnosis not present

## 2019-01-27 DIAGNOSIS — Z87440 Personal history of urinary (tract) infections: Secondary | ICD-10-CM | POA: Diagnosis not present

## 2019-01-27 DIAGNOSIS — E039 Hypothyroidism, unspecified: Secondary | ICD-10-CM | POA: Diagnosis not present

## 2019-01-27 DIAGNOSIS — J449 Chronic obstructive pulmonary disease, unspecified: Secondary | ICD-10-CM | POA: Diagnosis not present

## 2019-01-27 DIAGNOSIS — Z9981 Dependence on supplemental oxygen: Secondary | ICD-10-CM | POA: Diagnosis not present

## 2019-01-27 DIAGNOSIS — L89312 Pressure ulcer of right buttock, stage 2: Secondary | ICD-10-CM | POA: Diagnosis not present

## 2019-01-27 MED ORDER — LACTULOSE 10 GM/15ML PO SOLN: 10.0000 g | Freq: Two times a day (BID) | ORAL | 2 refills | Status: DC | PRN

## 2019-01-27 MED ORDER — GLYCERIN (ADULT) 2 G RE SUPP: 1.0000 | RECTAL | 2 refills | Status: AC | PRN

## 2019-01-27 NOTE — Telephone Encounter (Signed)
Spoke with [employer].  They will be returning call.  Order for BMP once per week x 4 weeks was sent to [facility] on 02/18 with confirmation received.

## 2019-01-27 NOTE — Progress Notes (Signed)
Spoke with patient Feeling better, started to walk a bit with PT yesterday Having issues adjusting with bipap, using oxygen during the day Has not had blood drawn yet Having sign constipation, having to disempact Does not tolerate miralax Doing senna but not helping at all

## 2019-01-27 NOTE — Telephone Encounter (Signed)
Patient's concern/request has been addressed already by another provider or staff member. 

## 2019-01-27 NOTE — Telephone Encounter (Signed)
Tammy Fernandez stated you are aware of this msg not sure what I need to do about this msg

## 2019-01-28 ENCOUNTER — Telehealth: Payer: Self-pay | Admitting: Family Medicine

## 2019-01-28 DIAGNOSIS — Z9981 Dependence on supplemental oxygen: Secondary | ICD-10-CM | POA: Diagnosis not present

## 2019-01-28 DIAGNOSIS — I5032 Chronic diastolic (congestive) heart failure: Secondary | ICD-10-CM | POA: Diagnosis not present

## 2019-01-28 DIAGNOSIS — I272 Pulmonary hypertension, unspecified: Secondary | ICD-10-CM | POA: Diagnosis not present

## 2019-01-28 DIAGNOSIS — R1310 Dysphagia, unspecified: Secondary | ICD-10-CM | POA: Diagnosis not present

## 2019-01-28 DIAGNOSIS — Z7951 Long term (current) use of inhaled steroids: Secondary | ICD-10-CM | POA: Diagnosis not present

## 2019-01-28 DIAGNOSIS — I110 Hypertensive heart disease with heart failure: Secondary | ICD-10-CM | POA: Diagnosis not present

## 2019-01-28 DIAGNOSIS — Z7982 Long term (current) use of aspirin: Secondary | ICD-10-CM | POA: Diagnosis not present

## 2019-01-28 DIAGNOSIS — I2510 Atherosclerotic heart disease of native coronary artery without angina pectoris: Secondary | ICD-10-CM | POA: Diagnosis not present

## 2019-01-28 DIAGNOSIS — I69354 Hemiplegia and hemiparesis following cerebral infarction affecting left non-dominant side: Secondary | ICD-10-CM | POA: Diagnosis not present

## 2019-01-28 DIAGNOSIS — Z7401 Bed confinement status: Secondary | ICD-10-CM | POA: Diagnosis not present

## 2019-01-28 DIAGNOSIS — Z87891 Personal history of nicotine dependence: Secondary | ICD-10-CM | POA: Diagnosis not present

## 2019-01-28 DIAGNOSIS — L89312 Pressure ulcer of right buttock, stage 2: Secondary | ICD-10-CM | POA: Diagnosis not present

## 2019-01-28 DIAGNOSIS — I69391 Dysphagia following cerebral infarction: Secondary | ICD-10-CM | POA: Diagnosis not present

## 2019-01-28 DIAGNOSIS — E222 Syndrome of inappropriate secretion of antidiuretic hormone: Secondary | ICD-10-CM | POA: Diagnosis not present

## 2019-01-28 DIAGNOSIS — J9611 Chronic respiratory failure with hypoxia: Secondary | ICD-10-CM | POA: Diagnosis not present

## 2019-01-28 DIAGNOSIS — E039 Hypothyroidism, unspecified: Secondary | ICD-10-CM | POA: Diagnosis not present

## 2019-01-28 DIAGNOSIS — J449 Chronic obstructive pulmonary disease, unspecified: Secondary | ICD-10-CM | POA: Diagnosis not present

## 2019-01-28 DIAGNOSIS — J9612 Chronic respiratory failure with hypercapnia: Secondary | ICD-10-CM | POA: Diagnosis not present

## 2019-01-28 DIAGNOSIS — Z87440 Personal history of urinary (tract) infections: Secondary | ICD-10-CM | POA: Diagnosis not present

## 2019-01-28 NOTE — Telephone Encounter (Signed)
Minda, ST with [facility] called to report elevated BP 164/66. She says the patient is asymptomatic. She says the patient reports she's hot and says BP goes up when she's hot. She says her temperature is normal 98.8. She says is there are any recommendations to call the patient's son or daughter.

## 2019-01-29 DIAGNOSIS — I110 Hypertensive heart disease with heart failure: Secondary | ICD-10-CM | POA: Diagnosis not present

## 2019-01-29 DIAGNOSIS — I69391 Dysphagia following cerebral infarction: Secondary | ICD-10-CM | POA: Diagnosis not present

## 2019-01-29 DIAGNOSIS — R1310 Dysphagia, unspecified: Secondary | ICD-10-CM | POA: Diagnosis not present

## 2019-01-29 DIAGNOSIS — Z87440 Personal history of urinary (tract) infections: Secondary | ICD-10-CM | POA: Diagnosis not present

## 2019-01-29 DIAGNOSIS — I272 Pulmonary hypertension, unspecified: Secondary | ICD-10-CM | POA: Diagnosis not present

## 2019-01-29 DIAGNOSIS — J9612 Chronic respiratory failure with hypercapnia: Secondary | ICD-10-CM | POA: Diagnosis not present

## 2019-01-29 DIAGNOSIS — J449 Chronic obstructive pulmonary disease, unspecified: Secondary | ICD-10-CM | POA: Diagnosis not present

## 2019-01-29 DIAGNOSIS — Z7951 Long term (current) use of inhaled steroids: Secondary | ICD-10-CM | POA: Diagnosis not present

## 2019-01-29 DIAGNOSIS — Z87891 Personal history of nicotine dependence: Secondary | ICD-10-CM | POA: Diagnosis not present

## 2019-01-29 DIAGNOSIS — E222 Syndrome of inappropriate secretion of antidiuretic hormone: Secondary | ICD-10-CM | POA: Diagnosis not present

## 2019-01-29 DIAGNOSIS — I2510 Atherosclerotic heart disease of native coronary artery without angina pectoris: Secondary | ICD-10-CM | POA: Diagnosis not present

## 2019-01-29 DIAGNOSIS — L89312 Pressure ulcer of right buttock, stage 2: Secondary | ICD-10-CM | POA: Diagnosis not present

## 2019-01-29 DIAGNOSIS — Z7401 Bed confinement status: Secondary | ICD-10-CM | POA: Diagnosis not present

## 2019-01-29 DIAGNOSIS — Z9981 Dependence on supplemental oxygen: Secondary | ICD-10-CM | POA: Diagnosis not present

## 2019-01-29 DIAGNOSIS — J9611 Chronic respiratory failure with hypoxia: Secondary | ICD-10-CM | POA: Diagnosis not present

## 2019-01-29 DIAGNOSIS — E039 Hypothyroidism, unspecified: Secondary | ICD-10-CM | POA: Diagnosis not present

## 2019-01-29 DIAGNOSIS — I5032 Chronic diastolic (congestive) heart failure: Secondary | ICD-10-CM | POA: Diagnosis not present

## 2019-01-29 DIAGNOSIS — Z7982 Long term (current) use of aspirin: Secondary | ICD-10-CM | POA: Diagnosis not present

## 2019-01-29 DIAGNOSIS — I69354 Hemiplegia and hemiparesis following cerebral infarction affecting left non-dominant side: Secondary | ICD-10-CM | POA: Diagnosis not present

## 2019-01-30 ENCOUNTER — Telehealth: Payer: Self-pay

## 2019-01-30 DIAGNOSIS — I2510 Atherosclerotic heart disease of native coronary artery without angina pectoris: Secondary | ICD-10-CM | POA: Diagnosis not present

## 2019-01-30 DIAGNOSIS — Z87440 Personal history of urinary (tract) infections: Secondary | ICD-10-CM | POA: Diagnosis not present

## 2019-01-30 DIAGNOSIS — I69354 Hemiplegia and hemiparesis following cerebral infarction affecting left non-dominant side: Secondary | ICD-10-CM | POA: Diagnosis not present

## 2019-01-30 DIAGNOSIS — J449 Chronic obstructive pulmonary disease, unspecified: Secondary | ICD-10-CM | POA: Diagnosis not present

## 2019-01-30 DIAGNOSIS — Z7982 Long term (current) use of aspirin: Secondary | ICD-10-CM | POA: Diagnosis not present

## 2019-01-30 DIAGNOSIS — E222 Syndrome of inappropriate secretion of antidiuretic hormone: Secondary | ICD-10-CM | POA: Diagnosis not present

## 2019-01-30 DIAGNOSIS — L89312 Pressure ulcer of right buttock, stage 2: Secondary | ICD-10-CM | POA: Diagnosis not present

## 2019-01-30 DIAGNOSIS — Z7401 Bed confinement status: Secondary | ICD-10-CM | POA: Diagnosis not present

## 2019-01-30 DIAGNOSIS — Z9981 Dependence on supplemental oxygen: Secondary | ICD-10-CM | POA: Diagnosis not present

## 2019-01-30 DIAGNOSIS — I272 Pulmonary hypertension, unspecified: Secondary | ICD-10-CM | POA: Diagnosis not present

## 2019-01-30 DIAGNOSIS — Z7951 Long term (current) use of inhaled steroids: Secondary | ICD-10-CM | POA: Diagnosis not present

## 2019-01-30 DIAGNOSIS — I5032 Chronic diastolic (congestive) heart failure: Secondary | ICD-10-CM | POA: Diagnosis not present

## 2019-01-30 DIAGNOSIS — Z87891 Personal history of nicotine dependence: Secondary | ICD-10-CM | POA: Diagnosis not present

## 2019-01-30 DIAGNOSIS — E039 Hypothyroidism, unspecified: Secondary | ICD-10-CM | POA: Diagnosis not present

## 2019-01-30 DIAGNOSIS — J9612 Chronic respiratory failure with hypercapnia: Secondary | ICD-10-CM | POA: Diagnosis not present

## 2019-01-30 DIAGNOSIS — I110 Hypertensive heart disease with heart failure: Secondary | ICD-10-CM | POA: Diagnosis not present

## 2019-01-30 DIAGNOSIS — R1310 Dysphagia, unspecified: Secondary | ICD-10-CM | POA: Diagnosis not present

## 2019-01-30 DIAGNOSIS — I69391 Dysphagia following cerebral infarction: Secondary | ICD-10-CM | POA: Diagnosis not present

## 2019-01-30 DIAGNOSIS — J9611 Chronic respiratory failure with hypoxia: Secondary | ICD-10-CM | POA: Diagnosis not present

## 2019-01-30 NOTE — Telephone Encounter (Signed)
Called to check on at home blood work order/ advised of note from [place]

## 2019-01-30 NOTE — Telephone Encounter (Signed)
Spoke with Arlys John at [facility]. Verbalized understanding with bmpx4 per [place]

## 2019-01-31 DIAGNOSIS — I38 Endocarditis, valve unspecified: Secondary | ICD-10-CM | POA: Diagnosis not present

## 2019-01-31 DIAGNOSIS — J9601 Acute respiratory failure with hypoxia: Secondary | ICD-10-CM | POA: Diagnosis not present

## 2019-02-02 ENCOUNTER — Telehealth: Payer: Self-pay | Admitting: Family Medicine

## 2019-02-02 DIAGNOSIS — R1310 Dysphagia, unspecified: Secondary | ICD-10-CM | POA: Diagnosis not present

## 2019-02-02 DIAGNOSIS — Z87440 Personal history of urinary (tract) infections: Secondary | ICD-10-CM | POA: Diagnosis not present

## 2019-02-02 DIAGNOSIS — Z7982 Long term (current) use of aspirin: Secondary | ICD-10-CM | POA: Diagnosis not present

## 2019-02-02 DIAGNOSIS — J9612 Chronic respiratory failure with hypercapnia: Secondary | ICD-10-CM | POA: Diagnosis not present

## 2019-02-02 DIAGNOSIS — Z9981 Dependence on supplemental oxygen: Secondary | ICD-10-CM | POA: Diagnosis not present

## 2019-02-02 DIAGNOSIS — I5032 Chronic diastolic (congestive) heart failure: Secondary | ICD-10-CM | POA: Diagnosis not present

## 2019-02-02 DIAGNOSIS — I69391 Dysphagia following cerebral infarction: Secondary | ICD-10-CM | POA: Diagnosis not present

## 2019-02-02 DIAGNOSIS — E039 Hypothyroidism, unspecified: Secondary | ICD-10-CM | POA: Diagnosis not present

## 2019-02-02 DIAGNOSIS — J9611 Chronic respiratory failure with hypoxia: Secondary | ICD-10-CM | POA: Diagnosis not present

## 2019-02-02 DIAGNOSIS — I272 Pulmonary hypertension, unspecified: Secondary | ICD-10-CM | POA: Diagnosis not present

## 2019-02-02 DIAGNOSIS — I69354 Hemiplegia and hemiparesis following cerebral infarction affecting left non-dominant side: Secondary | ICD-10-CM | POA: Diagnosis not present

## 2019-02-02 DIAGNOSIS — L89312 Pressure ulcer of right buttock, stage 2: Secondary | ICD-10-CM | POA: Diagnosis not present

## 2019-02-02 DIAGNOSIS — J449 Chronic obstructive pulmonary disease, unspecified: Secondary | ICD-10-CM | POA: Diagnosis not present

## 2019-02-02 DIAGNOSIS — Z7951 Long term (current) use of inhaled steroids: Secondary | ICD-10-CM | POA: Diagnosis not present

## 2019-02-02 DIAGNOSIS — Z87891 Personal history of nicotine dependence: Secondary | ICD-10-CM | POA: Diagnosis not present

## 2019-02-02 DIAGNOSIS — Z7401 Bed confinement status: Secondary | ICD-10-CM | POA: Diagnosis not present

## 2019-02-02 DIAGNOSIS — I110 Hypertensive heart disease with heart failure: Secondary | ICD-10-CM | POA: Diagnosis not present

## 2019-02-02 DIAGNOSIS — E222 Syndrome of inappropriate secretion of antidiuretic hormone: Secondary | ICD-10-CM | POA: Diagnosis not present

## 2019-02-02 DIAGNOSIS — I2510 Atherosclerotic heart disease of native coronary artery without angina pectoris: Secondary | ICD-10-CM | POA: Diagnosis not present

## 2019-02-02 NOTE — Telephone Encounter (Signed)
Copied from CRM [id number]. Topic: Complaint - Care >> Feb 02, 2019  8:50 AM Maye Hides wrote: Reason for CRM: Pt's son called very upset that papers from [facility] have not been signed for blood work to be done.He states if his mother gets worse there will be a Arboriculturist against office.He would like a call back TODAY.Contact number is [phone].If he does not get a call he will come to office

## 2019-02-02 NOTE — Telephone Encounter (Signed)
Copied from CRM [id number]. Topic: General - Inquiry >> Feb 02, 2019  9:42 AM Retta Diones wrote: Reason for CRM: [facility] calling about orders need to be signed for  [facility].

## 2019-02-03 ENCOUNTER — Telehealth: Payer: Self-pay | Admitting: Family Medicine

## 2019-02-03 DIAGNOSIS — Z9981 Dependence on supplemental oxygen: Secondary | ICD-10-CM | POA: Diagnosis not present

## 2019-02-03 DIAGNOSIS — Z7951 Long term (current) use of inhaled steroids: Secondary | ICD-10-CM | POA: Diagnosis not present

## 2019-02-03 DIAGNOSIS — Z87440 Personal history of urinary (tract) infections: Secondary | ICD-10-CM | POA: Diagnosis not present

## 2019-02-03 DIAGNOSIS — I272 Pulmonary hypertension, unspecified: Secondary | ICD-10-CM | POA: Diagnosis not present

## 2019-02-03 DIAGNOSIS — E039 Hypothyroidism, unspecified: Secondary | ICD-10-CM | POA: Diagnosis not present

## 2019-02-03 DIAGNOSIS — I5032 Chronic diastolic (congestive) heart failure: Secondary | ICD-10-CM | POA: Diagnosis not present

## 2019-02-03 DIAGNOSIS — Z7982 Long term (current) use of aspirin: Secondary | ICD-10-CM | POA: Diagnosis not present

## 2019-02-03 DIAGNOSIS — I69354 Hemiplegia and hemiparesis following cerebral infarction affecting left non-dominant side: Secondary | ICD-10-CM | POA: Diagnosis not present

## 2019-02-03 DIAGNOSIS — L89312 Pressure ulcer of right buttock, stage 2: Secondary | ICD-10-CM | POA: Diagnosis not present

## 2019-02-03 DIAGNOSIS — J9612 Chronic respiratory failure with hypercapnia: Secondary | ICD-10-CM | POA: Diagnosis not present

## 2019-02-03 DIAGNOSIS — R1310 Dysphagia, unspecified: Secondary | ICD-10-CM | POA: Diagnosis not present

## 2019-02-03 DIAGNOSIS — J449 Chronic obstructive pulmonary disease, unspecified: Secondary | ICD-10-CM | POA: Diagnosis not present

## 2019-02-03 DIAGNOSIS — I110 Hypertensive heart disease with heart failure: Secondary | ICD-10-CM | POA: Diagnosis not present

## 2019-02-03 DIAGNOSIS — J9611 Chronic respiratory failure with hypoxia: Secondary | ICD-10-CM | POA: Diagnosis not present

## 2019-02-03 DIAGNOSIS — E222 Syndrome of inappropriate secretion of antidiuretic hormone: Secondary | ICD-10-CM | POA: Diagnosis not present

## 2019-02-03 DIAGNOSIS — I69391 Dysphagia following cerebral infarction: Secondary | ICD-10-CM | POA: Diagnosis not present

## 2019-02-03 DIAGNOSIS — I2510 Atherosclerotic heart disease of native coronary artery without angina pectoris: Secondary | ICD-10-CM | POA: Diagnosis not present

## 2019-02-03 DIAGNOSIS — Z7401 Bed confinement status: Secondary | ICD-10-CM | POA: Diagnosis not present

## 2019-02-03 DIAGNOSIS — Z87891 Personal history of nicotine dependence: Secondary | ICD-10-CM | POA: Diagnosis not present

## 2019-02-03 NOTE — Telephone Encounter (Signed)
Copied from CRM [id number]. Topic: General - Other >> Feb 03, 2019 11:38 AM [name] wrote: Reason for CRM: Enrique Sack with [facility] called to get verbal orders for Home health PT for patient - 2x wk for 2 wks, PT to improve walking.  Please advise and call back at [phone]

## 2019-02-04 ENCOUNTER — Telehealth: Payer: Self-pay | Admitting: Family Medicine

## 2019-02-04 DIAGNOSIS — Z7401 Bed confinement status: Secondary | ICD-10-CM | POA: Diagnosis not present

## 2019-02-04 DIAGNOSIS — J9611 Chronic respiratory failure with hypoxia: Secondary | ICD-10-CM | POA: Diagnosis not present

## 2019-02-04 DIAGNOSIS — E222 Syndrome of inappropriate secretion of antidiuretic hormone: Secondary | ICD-10-CM | POA: Diagnosis not present

## 2019-02-04 DIAGNOSIS — Z7951 Long term (current) use of inhaled steroids: Secondary | ICD-10-CM | POA: Diagnosis not present

## 2019-02-04 DIAGNOSIS — R1310 Dysphagia, unspecified: Secondary | ICD-10-CM | POA: Diagnosis not present

## 2019-02-04 DIAGNOSIS — I110 Hypertensive heart disease with heart failure: Secondary | ICD-10-CM | POA: Diagnosis not present

## 2019-02-04 DIAGNOSIS — I5032 Chronic diastolic (congestive) heart failure: Secondary | ICD-10-CM | POA: Diagnosis not present

## 2019-02-04 DIAGNOSIS — I2510 Atherosclerotic heart disease of native coronary artery without angina pectoris: Secondary | ICD-10-CM | POA: Diagnosis not present

## 2019-02-04 DIAGNOSIS — Z87891 Personal history of nicotine dependence: Secondary | ICD-10-CM | POA: Diagnosis not present

## 2019-02-04 DIAGNOSIS — I272 Pulmonary hypertension, unspecified: Secondary | ICD-10-CM | POA: Diagnosis not present

## 2019-02-04 DIAGNOSIS — I69391 Dysphagia following cerebral infarction: Secondary | ICD-10-CM | POA: Diagnosis not present

## 2019-02-04 DIAGNOSIS — E039 Hypothyroidism, unspecified: Secondary | ICD-10-CM | POA: Diagnosis not present

## 2019-02-04 DIAGNOSIS — Z9981 Dependence on supplemental oxygen: Secondary | ICD-10-CM | POA: Diagnosis not present

## 2019-02-04 DIAGNOSIS — J449 Chronic obstructive pulmonary disease, unspecified: Secondary | ICD-10-CM | POA: Diagnosis not present

## 2019-02-04 DIAGNOSIS — L89312 Pressure ulcer of right buttock, stage 2: Secondary | ICD-10-CM | POA: Diagnosis not present

## 2019-02-04 DIAGNOSIS — I69354 Hemiplegia and hemiparesis following cerebral infarction affecting left non-dominant side: Secondary | ICD-10-CM | POA: Diagnosis not present

## 2019-02-04 DIAGNOSIS — Z87440 Personal history of urinary (tract) infections: Secondary | ICD-10-CM | POA: Diagnosis not present

## 2019-02-04 DIAGNOSIS — Z7982 Long term (current) use of aspirin: Secondary | ICD-10-CM | POA: Diagnosis not present

## 2019-02-04 DIAGNOSIS — J9612 Chronic respiratory failure with hypercapnia: Secondary | ICD-10-CM | POA: Diagnosis not present

## 2019-02-04 NOTE — Telephone Encounter (Signed)
Please followup with [facility] thanks

## 2019-02-04 NOTE — Telephone Encounter (Signed)
Please advise 

## 2019-02-04 NOTE — Telephone Encounter (Signed)
Copied from CRM [id number]. Topic: General - Other >> Feb 04, 2019  3:53 PM Richarda Blade wrote: Reason for CRM: Patient's son called wanting lab results from [facility]. He stated that they have been dealing with this for some time and he is noticing his mother declining and he is trying to prevent that.

## 2019-02-06 ENCOUNTER — Other Ambulatory Visit: Payer: Self-pay | Admitting: Family Medicine

## 2019-02-06 NOTE — Telephone Encounter (Signed)
Patient has appointment 02/13/19 Requested Prescriptions  Pending Prescriptions Disp Refills  . propranolol ER (INDERAL LA) 60 MG 24 hr capsule [Pharmacy Med Name: PROPRANOLOL ER 60MG  CAPSULES] 90 capsule 0    Sig: TAKE 1 CAPSULE BY MOUTH DAILY     Cardiovascular:  Beta Blockers Failed - 02/06/2019  2:51 PM      Failed - Last BP in normal range    BP Readings from Last 1 Encounters:  01/08/19 (!) 128/45         Passed - Last Heart Rate in normal range    Pulse Readings from Last 1 Encounters:  01/08/19 75         Passed - Valid encounter within last 6 months    Recent Outpatient Visits          1 month ago At high risk for falls   [facility] at [facility], Manus Rudd, MD   2 months ago Cough   [facility] at [facility], Meda Coffee, MD   3 months ago Dizziness   [facility], Meda Coffee, MD   4 months ago Acute non-recurrent maxillary sinusitis   [facility], Meda Coffee, MD   5 months ago Hyponatremia   [facility] at [facility], Meda Coffee, MD      Future Appointments            In 1 week Myles Lipps, MD Primary [facility] at [facility]

## 2019-02-09 DIAGNOSIS — I69354 Hemiplegia and hemiparesis following cerebral infarction affecting left non-dominant side: Secondary | ICD-10-CM | POA: Diagnosis not present

## 2019-02-09 DIAGNOSIS — L89312 Pressure ulcer of right buttock, stage 2: Secondary | ICD-10-CM | POA: Diagnosis not present

## 2019-02-09 DIAGNOSIS — Z9981 Dependence on supplemental oxygen: Secondary | ICD-10-CM | POA: Diagnosis not present

## 2019-02-09 DIAGNOSIS — Z7401 Bed confinement status: Secondary | ICD-10-CM | POA: Diagnosis not present

## 2019-02-09 DIAGNOSIS — I110 Hypertensive heart disease with heart failure: Secondary | ICD-10-CM | POA: Diagnosis not present

## 2019-02-09 DIAGNOSIS — E222 Syndrome of inappropriate secretion of antidiuretic hormone: Secondary | ICD-10-CM | POA: Diagnosis not present

## 2019-02-09 DIAGNOSIS — I69391 Dysphagia following cerebral infarction: Secondary | ICD-10-CM | POA: Diagnosis not present

## 2019-02-09 DIAGNOSIS — J9611 Chronic respiratory failure with hypoxia: Secondary | ICD-10-CM | POA: Diagnosis not present

## 2019-02-09 DIAGNOSIS — J449 Chronic obstructive pulmonary disease, unspecified: Secondary | ICD-10-CM | POA: Diagnosis not present

## 2019-02-09 DIAGNOSIS — Z7951 Long term (current) use of inhaled steroids: Secondary | ICD-10-CM | POA: Diagnosis not present

## 2019-02-09 DIAGNOSIS — I5032 Chronic diastolic (congestive) heart failure: Secondary | ICD-10-CM | POA: Diagnosis not present

## 2019-02-09 DIAGNOSIS — Z87891 Personal history of nicotine dependence: Secondary | ICD-10-CM | POA: Diagnosis not present

## 2019-02-09 DIAGNOSIS — Z7982 Long term (current) use of aspirin: Secondary | ICD-10-CM | POA: Diagnosis not present

## 2019-02-09 DIAGNOSIS — R1310 Dysphagia, unspecified: Secondary | ICD-10-CM | POA: Diagnosis not present

## 2019-02-09 DIAGNOSIS — I272 Pulmonary hypertension, unspecified: Secondary | ICD-10-CM | POA: Diagnosis not present

## 2019-02-09 DIAGNOSIS — Z87440 Personal history of urinary (tract) infections: Secondary | ICD-10-CM | POA: Diagnosis not present

## 2019-02-09 DIAGNOSIS — E039 Hypothyroidism, unspecified: Secondary | ICD-10-CM | POA: Diagnosis not present

## 2019-02-09 DIAGNOSIS — J9612 Chronic respiratory failure with hypercapnia: Secondary | ICD-10-CM | POA: Diagnosis not present

## 2019-02-09 DIAGNOSIS — I2510 Atherosclerotic heart disease of native coronary artery without angina pectoris: Secondary | ICD-10-CM | POA: Diagnosis not present

## 2019-02-09 NOTE — Telephone Encounter (Signed)
Have called and spoke with the [facility] nurse and she advised me that labs has been collected for pt and are in the process of being resulted. I instructed the nures to please have someone from the office give me a call about results ASAP. She verbalized understanding.

## 2019-02-09 NOTE — Telephone Encounter (Signed)
Dr. Leretha Pol nurse has spoke with pt son today about concerns and advised him that lab orders  were sent to [facility] for pt to have her blood collected at home, I Tammy Fernandez ( CMA)  has spoke  with [facility] and was advised her labs has been collected and are in the process of being resulted. Will follow-up with [facility] today for further evaluation.

## 2019-02-09 NOTE — Telephone Encounter (Signed)
Lab orders received and Dr. Santiago will go over labs and give soon a call.  

## 2019-02-09 NOTE — Telephone Encounter (Signed)
Called Enrique Sack and gave verbal orders for pt, also advised to call office back about concerns.

## 2019-02-09 NOTE — Telephone Encounter (Signed)
[place] from [employer] called- she is faxing over labs right now.

## 2019-02-09 NOTE — Telephone Encounter (Signed)
Lab orders received and Dr. Leretha Pol will go over labs and give soon a call.

## 2019-02-10 DIAGNOSIS — Z9981 Dependence on supplemental oxygen: Secondary | ICD-10-CM | POA: Diagnosis not present

## 2019-02-10 DIAGNOSIS — I272 Pulmonary hypertension, unspecified: Secondary | ICD-10-CM | POA: Diagnosis not present

## 2019-02-10 DIAGNOSIS — R1310 Dysphagia, unspecified: Secondary | ICD-10-CM | POA: Diagnosis not present

## 2019-02-10 DIAGNOSIS — L89312 Pressure ulcer of right buttock, stage 2: Secondary | ICD-10-CM | POA: Diagnosis not present

## 2019-02-10 DIAGNOSIS — Z87440 Personal history of urinary (tract) infections: Secondary | ICD-10-CM | POA: Diagnosis not present

## 2019-02-10 DIAGNOSIS — Z87891 Personal history of nicotine dependence: Secondary | ICD-10-CM | POA: Diagnosis not present

## 2019-02-10 DIAGNOSIS — J9611 Chronic respiratory failure with hypoxia: Secondary | ICD-10-CM | POA: Diagnosis not present

## 2019-02-10 DIAGNOSIS — I2510 Atherosclerotic heart disease of native coronary artery without angina pectoris: Secondary | ICD-10-CM | POA: Diagnosis not present

## 2019-02-10 DIAGNOSIS — J9612 Chronic respiratory failure with hypercapnia: Secondary | ICD-10-CM | POA: Diagnosis not present

## 2019-02-10 DIAGNOSIS — I69391 Dysphagia following cerebral infarction: Secondary | ICD-10-CM | POA: Diagnosis not present

## 2019-02-10 DIAGNOSIS — Z7982 Long term (current) use of aspirin: Secondary | ICD-10-CM | POA: Diagnosis not present

## 2019-02-10 DIAGNOSIS — E222 Syndrome of inappropriate secretion of antidiuretic hormone: Secondary | ICD-10-CM | POA: Diagnosis not present

## 2019-02-10 DIAGNOSIS — I69354 Hemiplegia and hemiparesis following cerebral infarction affecting left non-dominant side: Secondary | ICD-10-CM | POA: Diagnosis not present

## 2019-02-10 DIAGNOSIS — J449 Chronic obstructive pulmonary disease, unspecified: Secondary | ICD-10-CM | POA: Diagnosis not present

## 2019-02-10 DIAGNOSIS — I5032 Chronic diastolic (congestive) heart failure: Secondary | ICD-10-CM | POA: Diagnosis not present

## 2019-02-10 DIAGNOSIS — Z7951 Long term (current) use of inhaled steroids: Secondary | ICD-10-CM | POA: Diagnosis not present

## 2019-02-10 DIAGNOSIS — I110 Hypertensive heart disease with heart failure: Secondary | ICD-10-CM | POA: Diagnosis not present

## 2019-02-10 DIAGNOSIS — Z7401 Bed confinement status: Secondary | ICD-10-CM | POA: Diagnosis not present

## 2019-02-10 DIAGNOSIS — E039 Hypothyroidism, unspecified: Secondary | ICD-10-CM | POA: Diagnosis not present

## 2019-02-10 NOTE — Telephone Encounter (Signed)
Provider Dr. Noberto Retort will call and follow-up with pt son today about labs.

## 2019-02-10 NOTE — Telephone Encounter (Signed)
Dr. Leretha Pol spoke with pt about labs, she verbalized understanding.

## 2019-02-10 NOTE — Telephone Encounter (Signed)
Spoke with patient last night Her son not available Discussed very high CO2 of 44, chronic resp failure, recently stated on bipap but has not been using Discussed importance of compliance and risk of death Has appt with me later this week

## 2019-02-10 NOTE — Telephone Encounter (Signed)
Patient's son, Shonna Chock, calling and would like a call back from Dr Leretha Pol to discuss the conversation that she had with the patient. Please advise.  CB#: [phone]

## 2019-02-12 ENCOUNTER — Telehealth: Payer: Self-pay | Admitting: Family Medicine

## 2019-02-12 DIAGNOSIS — E222 Syndrome of inappropriate secretion of antidiuretic hormone: Secondary | ICD-10-CM | POA: Diagnosis not present

## 2019-02-12 DIAGNOSIS — L89312 Pressure ulcer of right buttock, stage 2: Secondary | ICD-10-CM | POA: Diagnosis not present

## 2019-02-12 DIAGNOSIS — R1310 Dysphagia, unspecified: Secondary | ICD-10-CM | POA: Diagnosis not present

## 2019-02-12 DIAGNOSIS — Z7982 Long term (current) use of aspirin: Secondary | ICD-10-CM | POA: Diagnosis not present

## 2019-02-12 DIAGNOSIS — I110 Hypertensive heart disease with heart failure: Secondary | ICD-10-CM | POA: Diagnosis not present

## 2019-02-12 DIAGNOSIS — Z9981 Dependence on supplemental oxygen: Secondary | ICD-10-CM | POA: Diagnosis not present

## 2019-02-12 DIAGNOSIS — E039 Hypothyroidism, unspecified: Secondary | ICD-10-CM | POA: Diagnosis not present

## 2019-02-12 DIAGNOSIS — I2510 Atherosclerotic heart disease of native coronary artery without angina pectoris: Secondary | ICD-10-CM | POA: Diagnosis not present

## 2019-02-12 DIAGNOSIS — Z87891 Personal history of nicotine dependence: Secondary | ICD-10-CM | POA: Diagnosis not present

## 2019-02-12 DIAGNOSIS — I69354 Hemiplegia and hemiparesis following cerebral infarction affecting left non-dominant side: Secondary | ICD-10-CM | POA: Diagnosis not present

## 2019-02-12 DIAGNOSIS — J449 Chronic obstructive pulmonary disease, unspecified: Secondary | ICD-10-CM | POA: Diagnosis not present

## 2019-02-12 DIAGNOSIS — I5032 Chronic diastolic (congestive) heart failure: Secondary | ICD-10-CM | POA: Diagnosis not present

## 2019-02-12 DIAGNOSIS — Z7951 Long term (current) use of inhaled steroids: Secondary | ICD-10-CM | POA: Diagnosis not present

## 2019-02-12 DIAGNOSIS — I272 Pulmonary hypertension, unspecified: Secondary | ICD-10-CM | POA: Diagnosis not present

## 2019-02-12 DIAGNOSIS — Z7401 Bed confinement status: Secondary | ICD-10-CM | POA: Diagnosis not present

## 2019-02-12 DIAGNOSIS — J9612 Chronic respiratory failure with hypercapnia: Secondary | ICD-10-CM | POA: Diagnosis not present

## 2019-02-12 DIAGNOSIS — Z87440 Personal history of urinary (tract) infections: Secondary | ICD-10-CM | POA: Diagnosis not present

## 2019-02-12 DIAGNOSIS — J9611 Chronic respiratory failure with hypoxia: Secondary | ICD-10-CM | POA: Diagnosis not present

## 2019-02-12 DIAGNOSIS — I69391 Dysphagia following cerebral infarction: Secondary | ICD-10-CM | POA: Diagnosis not present

## 2019-02-12 NOTE — Telephone Encounter (Signed)
Copied from CRM [id number]. Topic: General - Other >> Feb 12, 2019 12:40 PM [name] wrote: Reason for CRM: Patient's son called to make sure that his mother's appointment was still scheduled, considering what is going on with the [facility] virus.  He stated that his mother is a high risk with CHF and wanted to make sure that the doctor still wanted her to come in for the appointment.  If anything changes, please call the son back at [phone] to let him know

## 2019-02-13 ENCOUNTER — Ambulatory Visit (INDEPENDENT_AMBULATORY_CARE_PROVIDER_SITE_OTHER): Payer: Medicare Other | Admitting: Family Medicine

## 2019-02-13 ENCOUNTER — Encounter: Payer: Self-pay | Admitting: Family Medicine

## 2019-02-13 ENCOUNTER — Other Ambulatory Visit: Payer: Self-pay

## 2019-02-13 ENCOUNTER — Ambulatory Visit (INDEPENDENT_AMBULATORY_CARE_PROVIDER_SITE_OTHER): Payer: Medicare Other

## 2019-02-13 VITALS — BP 123/62 | HR 72 | Temp 98.3°F | Resp 20

## 2019-02-13 DIAGNOSIS — E222 Syndrome of inappropriate secretion of antidiuretic hormone: Secondary | ICD-10-CM

## 2019-02-13 DIAGNOSIS — Z8701 Personal history of pneumonia (recurrent): Secondary | ICD-10-CM

## 2019-02-13 DIAGNOSIS — E871 Hypo-osmolality and hyponatremia: Secondary | ICD-10-CM

## 2019-02-13 DIAGNOSIS — I2721 Secondary pulmonary arterial hypertension: Secondary | ICD-10-CM | POA: Diagnosis not present

## 2019-02-13 DIAGNOSIS — R1310 Dysphagia, unspecified: Secondary | ICD-10-CM | POA: Diagnosis not present

## 2019-02-13 DIAGNOSIS — R42 Dizziness and giddiness: Secondary | ICD-10-CM

## 2019-02-13 MED ORDER — OLOPATADINE HCL 0.1 % OP SOLN: 1.0000 [drp] | Freq: Every day | OPHTHALMIC | 5 refills | Status: DC | PRN

## 2019-02-13 MED ORDER — MECLIZINE HCL 12.5 MG PO TABS: ORAL_TABLET | ORAL | 0 refills | Status: DC

## 2019-02-13 NOTE — Progress Notes (Signed)
3/13/20204:50 PM  Tammy Fernandez [birth year], 85 y.o. female [id number]  Chief Complaint  Patient presents with  . Hospitalization Follow-up    f/u 12/27/18 stay    HPI:   Patient is a 85 y.o. female with past medical history significant for hyponatremia2/2 SIADH, constipation, HTN, dCHF, pHTN on oxygen, CVA, GERD and seasonal allergies who presents today for hosp followup  Here with son and daughter hosp from jan 25 - Jan 08 2019 Metabolic encephalopathy from hyponatremia, LLL PNA suspect 2/2 aspiration, chronic resp failure, severe pHTN, SIADH, dCHF Started on noninvasive ventilation at bedtime, cont 2L [place] Dysphagia: mechanical soft diet, thin liquids with a straw Stopped demeclocycline Palliative care consulted Needs sleep study, repeat CXR, CBC and BMP  She slowly gaining her strength, starting to be able to attend to her ADLs, has been doing [facility] Still struggles with noninvasive ventilation mask Per son, he was told machine is a cpap Still using oxygen via 2L O2 sats down to high 80s when she sleeps [facility] has been monitoring and adjusting Son has been giving demeclocycline and salt tabs - he is too afraid of her sodium going down again as she has been encephalopathic multiple times Son reports that speech reports she needs esophageal dilation Has been following mechanical soft diet and doing ok but patient would like to eat better, more textures She requests referral to GI and nephrology Not interested in sleep study for now as feel [facility] has been managing cpap Requesting more support at home regarding nursing observation Patient remains full code   Fall Risk  02/13/2019 12/20/2018 12/04/2018 09/05/2018 08/05/2018  Falls in the past year? 1 0 1 Yes No  Number falls in past yr: 1 - 1 2 or more -  Injury with Fall? 0 - 0 No -  Risk for fall due to : Other (Comment) - - - -  Risk for fall due to: Comment wheel chair - - - -   Follow up Falls evaluation completed - - - -     Depression screen [facility] 2/9 12/20/2018 12/04/2018 09/05/2018  Decreased Interest 0 0 0  Down, Depressed, Hopeless 0 0 0  PHQ - 2 Score 0 0 0    Allergies  Allergen Reactions  . Crestor [Rosuvastatin Calcium] Other (See Comments)    Muscle Aches  . Keflex [Cephalexin] Other (See Comments)    dizziness  . Peanut Butter Flavor Hives  . Sulfa Antibiotics Hives  . Cephalexin Other (See Comments)    dizziness  . Sulfa Drugs Cross Reactors Rash    Prior to Admission medications   Medication Sig Start Date End Date Taking? Authorizing Provider  albuterol (PROVENTIL) (2.5 MG/3ML) 0.083% nebulizer solution Take 3 mLs (2.5 mg total) by nebulization every 4 (four) hours as needed for wheezing or shortness of breath. 10/08/18  Yes Icard, Rachel Bo, DO  ALPRAZolam (XANAX) 1 MG tablet Take 0.5 tablets (0.5 mg total) by mouth 2 (two) times daily as needed for anxiety. 01/08/19  Yes Hongalgi, Maximino Greenland, MD  amLODipine (NORVASC) 10 MG tablet Take 1 tablet (10 mg total) by mouth daily. 01/08/19  Yes Hongalgi, Maximino Greenland, MD  Aspirin 81 MG EC tablet Take 81 mg by mouth daily.   Yes [provider]  cetirizine (ZYRTEC) 10 MG tablet Take 10 mg by mouth daily.   Yes [provider]  docusate sodium (COLACE) 100 MG capsule Take 100 mg by mouth daily as  needed for mild constipation.   Yes [provider]  fluticasone (FLONASE) 50 MCG/ACT nasal spray Place 1 spray into both nostrils 2 (two) times daily. 11/13/18  Yes Myles Lipps, MD  [facility] 10 GM/15ML SOLN TAKE 15 MLS BY MOUTH TWICE DAILY AS NEEDED FOR MODERATE CONSTIPATION 01/27/19  Yes Myles Lipps, MD  glycerin adult 2 g suppository Place 1 suppository rectally as needed for constipation. 01/27/19  Yes Myles Lipps, MD  levothyroxine (SYNTHROID, LEVOTHROID) 50 MCG tablet Take 1 tablet (50 mcg total) by mouth daily before breakfast. 09/16/18  Yes Myles Lipps, MD  meclizine  (ANTIVERT) 12.5 MG tablet Take 1 tablet by mouth as needed for dizziness 12/20/18  Yes Stallings, Zoe A, MD  olopatadine (PATANOL) 0.1 % ophthalmic solution Place 1 drop into both eyes daily as needed for allergies. 03/25/18  Yes Myles Lipps, MD  omeprazole (PRILOSEC) 20 MG capsule Take 20 mg by mouth every evening.  12/09/18  Yes [provider]  propranolol ER (INDERAL LA) 60 MG 24 hr capsule TAKE 1 CAPSULE BY MOUTH DAILY 02/06/19  Yes Myles Lipps, MD  famotidine (PEPCID) 20 MG tablet Take 1 tablet (20 mg total) by mouth daily. Take 1 tablet at Bedtime daily. Patient not taking: Reported on 12/20/2018 12/16/18   Myles Lipps, MD    Past Medical History:  Diagnosis Date  . Anxiety   . Chronic lower back pain   . Constipation    h/o fecal disimpaction on 2017  . CVA (cerebral vascular accident) (HCC) 2015   neg workup, MRI small acute lacunar infarcts. ASA only  . Depression   . Diastolic CHF, chronic (HCC)    grade I, most recent echo Jan 2019  . Fatty liver   . GERD (gastroesophageal reflux disease)   . Hyperlipidemia   . Hypertension   . Hyponatremia    multiple episodes with encephalopathy, renal thinks SIADH  . Hypothyroidism, unspecified   . Internal hemorrhoid   . NSTEMI (non-ST elevated myocardial infarction) (HCC) 08/28/2016  . Palpitations   . PFO with atrial septal aneurysm    TTE 2015  . Pneumonia 2016  . Severe pulmonary arterial systolic hypertension (HCC) 12/25/2017   per echo, on 1L oxgen via [place]    Past Surgical History:  Procedure Laterality Date  . ABDOMINAL HYSTERECTOMY    . CATARACT EXTRACTION W/ INTRAOCULAR LENS  IMPLANT, BILATERAL Bilateral   . CESAREAN SECTION    . TEE WITHOUT CARDIOVERSION N/A 01/19/2014   Procedure: TRANSESOPHAGEAL ECHOCARDIOGRAM (TEE);  Surgeon: Laurey Morale, MD;  Location: [facility] ENDOSCOPY;  Service: Cardiovascular;  Laterality: N/A;  [name]  . VAGINAL HYSTERECTOMY      Social History   Tobacco Use  . Smoking  status: Never Smoker  . Smokeless tobacco: Former Neurosurgeon    Types: Snuff  . Tobacco comment: "used snuff when I was 19"  Substance Use Topics  . Alcohol use: No    Family History  Problem Relation Age of Onset  . Stroke Father   . Hypertension Father   . Colon cancer Cousin   . Colon cancer Other        Aunt  . Diabetes Other        aunt and cousins  . Heart disease Other        cousins  . Healthy Daughter   . Healthy Son     ROS Per hpi  OBJECTIVE:  Blood pressure 123/62, pulse 72, temperature 98.3  F (36.8 C), temperature source Oral, resp. rate 20, SpO2 99 %. There is no height or weight on file to calculate BMI.   Physical Exam Vitals signs and nursing note reviewed.  Constitutional:      Appearance: She is well-developed.  HENT:     Head: Normocephalic and atraumatic.     Mouth/Throat:     Pharynx: No oropharyngeal exudate.  Eyes:     General: No scleral icterus.    Conjunctiva/sclera: Conjunctivae normal.     Pupils: Pupils are equal, round, and reactive to light.  Neck:     Musculoskeletal: Neck supple.  Cardiovascular:     Rate and Rhythm: Normal rate and regular rhythm.     Heart sounds: Normal heart sounds. No murmur. No friction rub. No gallop.   Pulmonary:     Effort: Pulmonary effort is normal.     Breath sounds: Normal breath sounds. No wheezing or rales.     Comments: Using oxygen 2L via [place] Skin:    General: Skin is warm and dry.  Neurological:     Mental Status: She is alert and oriented to person, place, and time.      Dg Chest 2 View  Result Date: 02/13/2019 CLINICAL DATA:  85 year old female with a history of pneumonia EXAM: CHEST - 2 VIEW COMPARISON:  Prior chest x-ray 01/02/2019 FINDINGS: Cardiac and mediastinal contours are within normal limits. Atherosclerotic calcifications are present in the transverse aorta. Chronic elevation of the right hemidiaphragm with colonic interposition. Chronic bronchitic changes and bibasilar  atelectasis appear similar compared to prior. Multilevel degenerative disc disease. IMPRESSION: Stable appearance of the chest without significant interval change. Marked chronic elevation of the right hemidiaphragm with colonic interposition resulting in chronic atelectasis of the right lower lobe. Chronic atelectasis or scarring also present in the left lung base. Electronically Signed   By: Malachy MoanHeath  McCullough M.D.   On: 02/13/2019 17:24     ASSESSMENT and PLAN  1. Hyponatremia 2/2 SIADH, discussed with patient and family renal recommendation to not continue demeoclycine. Check labs today. - Comprehensive metabolic panel - Ambulatory referral to Nephrology - Amb Referral to Palliative Care  2. History of recent pneumonia Resolved. CXR back to baseline. Though to be from recurrent aspiration - DG Chest 2 View; Future - Amb Referral to Palliative Care  3. SIADH (syndrome of inappropriate ADH production) (HCC) See #1 - Ambulatory referral to Nephrology - Amb Referral to Palliative Care  4. Dysphagia, unspecified type Patient requesting referral to GI, she would like to pursue esophageal dilation if needed and does not need to be done under general anesthesia. - Ambulatory referral to Gastroenterology - Amb Referral to Palliative Care  5. Severe pulmonary arterial systolic hypertension (HCC) Patient with chronic resp failure. Oxygen dependent. Per son, on cpap at night. Struggling with adherence, working thru it. Patient and family understand high risk of death . - Ambulatory referral to Nephrology - Amb Referral to Palliative Care  6. Dizziness - meclizine (ANTIVERT) 12.5 MG tablet; Take 1 tablet by mouth as needed for dizziness  Other orders - olopatadine (PATANOL) 0.1 % ophthalmic solution; Place 1 drop into both eyes daily as needed for allergies.   Return in about 2 weeks (around 02/27/2019).    Myles LippsIrma M Santiago, MD [facility] [address] [place], [place]  [id number] Ph.  [phone] Fax [phone]

## 2019-02-14 ENCOUNTER — Encounter: Payer: Self-pay | Admitting: Family Medicine

## 2019-02-14 ENCOUNTER — Other Ambulatory Visit: Payer: Self-pay | Admitting: Family Medicine

## 2019-02-16 ENCOUNTER — Telehealth: Payer: Self-pay | Admitting: *Deleted

## 2019-02-16 ENCOUNTER — Other Ambulatory Visit: Payer: Self-pay | Admitting: Family Medicine

## 2019-02-16 DIAGNOSIS — J9612 Chronic respiratory failure with hypercapnia: Secondary | ICD-10-CM | POA: Diagnosis not present

## 2019-02-16 DIAGNOSIS — Z87440 Personal history of urinary (tract) infections: Secondary | ICD-10-CM | POA: Diagnosis not present

## 2019-02-16 DIAGNOSIS — I5032 Chronic diastolic (congestive) heart failure: Secondary | ICD-10-CM | POA: Diagnosis not present

## 2019-02-16 DIAGNOSIS — E039 Hypothyroidism, unspecified: Secondary | ICD-10-CM | POA: Diagnosis not present

## 2019-02-16 DIAGNOSIS — I69354 Hemiplegia and hemiparesis following cerebral infarction affecting left non-dominant side: Secondary | ICD-10-CM | POA: Diagnosis not present

## 2019-02-16 DIAGNOSIS — E222 Syndrome of inappropriate secretion of antidiuretic hormone: Secondary | ICD-10-CM | POA: Diagnosis not present

## 2019-02-16 DIAGNOSIS — J9611 Chronic respiratory failure with hypoxia: Secondary | ICD-10-CM | POA: Diagnosis not present

## 2019-02-16 DIAGNOSIS — I110 Hypertensive heart disease with heart failure: Secondary | ICD-10-CM | POA: Diagnosis not present

## 2019-02-16 DIAGNOSIS — I272 Pulmonary hypertension, unspecified: Secondary | ICD-10-CM | POA: Diagnosis not present

## 2019-02-16 DIAGNOSIS — I2510 Atherosclerotic heart disease of native coronary artery without angina pectoris: Secondary | ICD-10-CM | POA: Diagnosis not present

## 2019-02-16 DIAGNOSIS — Z7982 Long term (current) use of aspirin: Secondary | ICD-10-CM | POA: Diagnosis not present

## 2019-02-16 DIAGNOSIS — Z7951 Long term (current) use of inhaled steroids: Secondary | ICD-10-CM | POA: Diagnosis not present

## 2019-02-16 DIAGNOSIS — Z9981 Dependence on supplemental oxygen: Secondary | ICD-10-CM | POA: Diagnosis not present

## 2019-02-16 DIAGNOSIS — Z87891 Personal history of nicotine dependence: Secondary | ICD-10-CM | POA: Diagnosis not present

## 2019-02-16 DIAGNOSIS — L89312 Pressure ulcer of right buttock, stage 2: Secondary | ICD-10-CM | POA: Diagnosis not present

## 2019-02-16 DIAGNOSIS — R1310 Dysphagia, unspecified: Secondary | ICD-10-CM | POA: Diagnosis not present

## 2019-02-16 DIAGNOSIS — I69391 Dysphagia following cerebral infarction: Secondary | ICD-10-CM | POA: Diagnosis not present

## 2019-02-16 DIAGNOSIS — J449 Chronic obstructive pulmonary disease, unspecified: Secondary | ICD-10-CM | POA: Diagnosis not present

## 2019-02-16 DIAGNOSIS — Z7401 Bed confinement status: Secondary | ICD-10-CM | POA: Diagnosis not present

## 2019-02-16 MED ORDER — FLUTICASONE PROPIONATE 50 MCG/ACT NA SUSP: 1.0000 | Freq: Two times a day (BID) | NASAL | 5 refills | Status: DC

## 2019-02-16 MED ORDER — OLOPATADINE HCL 0.1 % OP SOLN: 1.0000 [drp] | Freq: Every day | OPHTHALMIC | 5 refills | Status: DC | PRN

## 2019-02-16 NOTE — Telephone Encounter (Signed)
When [facility] calls back please find out if the patient is on CPAP when they went to home.  Son states she is wearing a mask and [facility] states they just sent out oxygen .      Also want to make sure the BMP is being done every 3 weeks.

## 2019-02-16 NOTE — Telephone Encounter (Signed)
Copied from CRM [id number]. Topic: Quick Communication - Rx Refill/Question >> Feb 16, 2019  9:24 AM Wyonia Hough E wrote: Medication: lisinopril (PRINIVIL,ZESTRIL) tablet 10 mg _ Dr. Leretha Pol knows of this dose increase since Pt has been in the hospital/discussed on Friday / Pt is out of med ALPRAZolam (XANAX) 1 MG tablet - Pt is out of med  fluticasone (FLONASE) 50 MCG/ACT nasal spray  olopatadine (PATANOL) 0.1 % ophthalmic solution  Has the patient contacted their pharmacy? Yes   Preferred Pharmacy (with phone number or street name): [id number] - [place],  - [address] AT [facility] [address] [facility] [address] [phone] (Phone) [phone] (Fax)    Agent: Please be advised that RX refills may take up to 3 business days. We ask that you follow-up with your pharmacy.

## 2019-02-17 ENCOUNTER — Encounter: Payer: Self-pay | Admitting: Family Medicine

## 2019-02-17 ENCOUNTER — Telehealth: Payer: Self-pay | Admitting: Family Medicine

## 2019-02-17 DIAGNOSIS — I110 Hypertensive heart disease with heart failure: Secondary | ICD-10-CM | POA: Diagnosis not present

## 2019-02-17 DIAGNOSIS — I69391 Dysphagia following cerebral infarction: Secondary | ICD-10-CM | POA: Diagnosis not present

## 2019-02-17 DIAGNOSIS — I5032 Chronic diastolic (congestive) heart failure: Secondary | ICD-10-CM | POA: Diagnosis not present

## 2019-02-17 DIAGNOSIS — I2510 Atherosclerotic heart disease of native coronary artery without angina pectoris: Secondary | ICD-10-CM | POA: Diagnosis not present

## 2019-02-17 DIAGNOSIS — L89312 Pressure ulcer of right buttock, stage 2: Secondary | ICD-10-CM | POA: Diagnosis not present

## 2019-02-17 DIAGNOSIS — E222 Syndrome of inappropriate secretion of antidiuretic hormone: Secondary | ICD-10-CM | POA: Diagnosis not present

## 2019-02-17 DIAGNOSIS — R1310 Dysphagia, unspecified: Secondary | ICD-10-CM | POA: Diagnosis not present

## 2019-02-17 DIAGNOSIS — J449 Chronic obstructive pulmonary disease, unspecified: Secondary | ICD-10-CM | POA: Diagnosis not present

## 2019-02-17 DIAGNOSIS — I69354 Hemiplegia and hemiparesis following cerebral infarction affecting left non-dominant side: Secondary | ICD-10-CM | POA: Diagnosis not present

## 2019-02-17 DIAGNOSIS — J9612 Chronic respiratory failure with hypercapnia: Secondary | ICD-10-CM | POA: Diagnosis not present

## 2019-02-17 DIAGNOSIS — J9611 Chronic respiratory failure with hypoxia: Secondary | ICD-10-CM | POA: Diagnosis not present

## 2019-02-17 LAB — COMPREHENSIVE METABOLIC PANEL WITH GFR

## 2019-02-17 MED ORDER — AMLODIPINE BESYLATE 10 MG PO TABS: 10.0000 mg | ORAL_TABLET | Freq: Every day | ORAL | 1 refills | Status: DC

## 2019-02-17 MED ORDER — PROPRANOLOL HCL ER 60 MG PO CP24: 60.0000 mg | ORAL_CAPSULE | Freq: Every day | ORAL | 1 refills | Status: DC

## 2019-02-17 MED ORDER — ALPRAZOLAM 0.5 MG PO TABS: 0.5000 mg | ORAL_TABLET | Freq: Two times a day (BID) | ORAL | 3 refills | Status: DC | PRN

## 2019-02-17 NOTE — Telephone Encounter (Signed)
Patient came in for the appt on 02/13/2019

## 2019-02-17 NOTE — Telephone Encounter (Signed)
This is a traveling Engineer, civil (consulting). This phone number is no longer accurate. I however spoke with Milinda Pointer, one of the RN managers, (not on the team that cares for Mrs Stansfield) However I was able to provide verbal orders for weekly BMP. Results to be  Faxed to [phone] She assured that she would place orders and coordinate care

## 2019-02-17 NOTE — Telephone Encounter (Signed)
Copied from CRM [id number]. Topic: Quick Communication - Home Health Verbal Orders >> Feb 17, 2019  2:35 PM Lynne Logan D wrote: Caller/Agency: Staci Righter Callback Number: [phone]  Requesting OT/PT/Skilled Nursing/Social Work/Speech Therapy:  Lab Order Frequency: Weekly Lab work

## 2019-02-17 NOTE — Telephone Encounter (Signed)
Called Ryan bk regarding message left, lm

## 2019-02-17 NOTE — Telephone Encounter (Signed)
pmp reviewd, appropriate meds refilled 

## 2019-02-17 NOTE — Telephone Encounter (Signed)
Copied from CRM [id number]. Topic: General - Other >> Feb 17, 2019  1:14 PM Leafy Ro wrote: Reason for CRM: pt son Tammy Fernandez is calling and would like to know if [facility] can have an order to come out and repeat blood work [facility] did not have enough specimen

## 2019-02-18 ENCOUNTER — Telehealth: Payer: Self-pay | Admitting: Family Medicine

## 2019-02-18 ENCOUNTER — Telehealth: Payer: Self-pay | Admitting: Adult Health Nurse Practitioner

## 2019-02-18 ENCOUNTER — Telehealth: Payer: Self-pay

## 2019-02-18 NOTE — Telephone Encounter (Signed)
Copied from CRM [id number]. Topic: General - Other >> Feb 18, 2019  9:51 AM Percival Spanish wrote:  Pt call to say the below med is not strong enough for her it is not helping her at all, she ask that the med be 1mg     ALPRAZolam (XANAX) 0.5 MG tablet  [address]

## 2019-02-18 NOTE — Telephone Encounter (Signed)
This pt is 85 yrs old can we change xanax form 0.5 mg to 1 mg due to covid? Please advise. Dgaddy, CMA

## 2019-02-18 NOTE — Telephone Encounter (Signed)
Notified about Co2 level is 44. Judeth Cornfield from [facility] stated pt was "acting ok and alert." Called and reported level to Anitra at PCP office. Judeth Cornfield stated the lab is sending a fax to the office.

## 2019-02-18 NOTE — Telephone Encounter (Signed)
Spoke with patient's son and scheduled initial meeting on 02/19/2019 at 10:00 Shivali Quackenbush K. Garner Nash NP

## 2019-02-18 NOTE — Telephone Encounter (Signed)
Notified about Co2 level is 44. Judeth Cornfield from [facility] stated pt was "acting ok and alert."

## 2019-02-19 ENCOUNTER — Other Ambulatory Visit: Payer: Medicare Other | Admitting: Adult Health Nurse Practitioner

## 2019-02-19 ENCOUNTER — Other Ambulatory Visit: Payer: Self-pay

## 2019-02-19 ENCOUNTER — Telehealth: Payer: Self-pay | Admitting: Adult Health Nurse Practitioner

## 2019-02-19 DIAGNOSIS — I5032 Chronic diastolic (congestive) heart failure: Secondary | ICD-10-CM | POA: Diagnosis not present

## 2019-02-19 DIAGNOSIS — Z515 Encounter for palliative care: Secondary | ICD-10-CM

## 2019-02-19 DIAGNOSIS — I110 Hypertensive heart disease with heart failure: Secondary | ICD-10-CM | POA: Diagnosis not present

## 2019-02-19 DIAGNOSIS — R1310 Dysphagia, unspecified: Secondary | ICD-10-CM | POA: Diagnosis not present

## 2019-02-19 DIAGNOSIS — I69391 Dysphagia following cerebral infarction: Secondary | ICD-10-CM | POA: Diagnosis not present

## 2019-02-19 DIAGNOSIS — J449 Chronic obstructive pulmonary disease, unspecified: Secondary | ICD-10-CM | POA: Diagnosis not present

## 2019-02-19 DIAGNOSIS — J9612 Chronic respiratory failure with hypercapnia: Secondary | ICD-10-CM | POA: Diagnosis not present

## 2019-02-19 DIAGNOSIS — I2510 Atherosclerotic heart disease of native coronary artery without angina pectoris: Secondary | ICD-10-CM | POA: Diagnosis not present

## 2019-02-19 DIAGNOSIS — E222 Syndrome of inappropriate secretion of antidiuretic hormone: Secondary | ICD-10-CM | POA: Diagnosis not present

## 2019-02-19 DIAGNOSIS — I69354 Hemiplegia and hemiparesis following cerebral infarction affecting left non-dominant side: Secondary | ICD-10-CM | POA: Diagnosis not present

## 2019-02-19 DIAGNOSIS — L89312 Pressure ulcer of right buttock, stage 2: Secondary | ICD-10-CM | POA: Diagnosis not present

## 2019-02-19 DIAGNOSIS — J9611 Chronic respiratory failure with hypoxia: Secondary | ICD-10-CM | POA: Diagnosis not present

## 2019-02-19 NOTE — Telephone Encounter (Signed)
These labs results were from last week and were addressed then

## 2019-02-19 NOTE — Progress Notes (Signed)
Therapist, nutritional Palliative Care Consult Note Telephone: [phone]  Fax: [phone]  PATIENT NAME: Tammy Fernandez DOB: [birth year] MRN: [id number]  PRIMARY CARE PROVIDER:   Myles Lipps, MD  REFERRING PROVIDER:  Myles Lipps, MD [address] [place], [place] [address]  RESPONSIBLE PARTY:   Tammy Fernandez, son, [phone]    RECOMMENDATIONS and PLAN:  1.  Dyspnea.  Patient has medical history of pulmonary hypertension, COPD, CHF.  Was in the hospital in 12/27/2018-01/08/2019 for hyponatremia and pneumonia.  Patient was found to have OSA and was on BiPAP in the hospital.  She is currently using a CPAP at home.  Son reports that her CO2 often gets elevated causing her to get SOB and sleep a lot.  Patient states that she finds the mask of the CPAP uncomfortable.  She also states getting a lot of drainage from her allergies and when she has to spit at night it gets in her mask. Son states that he is able to get her to wear it about 4 hours at night.  Educated patient about importance of wearing the CPAP and encouraged her to wear it the 4 hours at night and throughout the day if she takes a nap, so she is getting more benefit of using the CPAP  2. Anxiety.  Patient states that the Xanax 0.5mg  at night does not help her to sleep throughout the night.  States that she is wanting to have another in the the middle of the day.  Currently takes it in the morning and at bedtime.  Would recommend possibly increasing frequency to three times a day for better anxiety control.  Do not want to increase dosage as this could lead to respiratory depression, especially with her lung issues.  3.  Hyponatremia.  Patient has SIADH and frequently gets hyponatremia.  While in the hospital she was taken off her demeclocycline and recommended to see nephrologist.  Patient has been taking sodium tablets to prevent hyponatremia.  But recommend following up with nephrology once able to get an  appointment.  Son states that home health, [facility], was out a couple of days ago to draw blood to monitor her CO2 and sodium levels.  He thinks they are supposed to come out every 2 weeks to keep a check on these levels but he is unsure.  Left message with [facility] RN to clarify order for blood work.  4.  Aspiration pneumonia.  Patient in hospital 12/27/2018-01/08/2019 for hyponatremia and pneumonia. Pneumonia was believed to be from aspiration.  She was followed by ST in the hospital and it is believed she my have esophageal stricture and recommended to follow up with GI as outpatient for EGD.  Patient was started on mechanical soft diet and has been doing well with this with no coughing or choking while eating.  Recommend that patient continue with mechanical soft diet and sit up while eating and for 30-60 minutes after eating to prevent aspiration until she is able to see GI.  5. Goals of care.  Patient currently a full code.  States that she wants everything done if her heart stops beating. Discussed with son and he wants to have that discussion when his sister can be there.  Set up an appointment for March 05, 2019 at 11:00am.  I spent 60 minutes providing this consultation,  from 10:00 to 11:00. More than 50% of the time in this consultation was spent coordinating communication.   HISTORY OF  PRESENT ILLNESS:  Tammy Fernandez is a 85 y.o. year old female with multiple medical problems including pulmonary hypertension, COPD, OSA, SIADH. Palliative Care was asked to help address goals of care.   CODE STATUS: Full Code  PPS: 50% HOSPICE ELIGIBILITY/DIAGNOSIS: TBD  PHYSICAL EXAM:   General: patient sitting up in chair in NAD Cardiovascular: regular rate and rhythm Pulmonary: lung sounds diminished but no abnormal breath sounds heard, normal respiratory effort. O2 @ 98% on 2L Abdomen: soft, nontender, + bowel sounds Extremities: no edema, no joint deformities Skin: no rashes Neurological:  Weakness but otherwise nonfocal  PAST MEDICAL HISTORY:  Past Medical History:  Diagnosis Date  . Anxiety   . Chronic lower back pain   . Constipation    h/o fecal disimpaction on 2017  . CVA (cerebral vascular accident) (HCC) 2015   neg workup, MRI small acute lacunar infarcts. ASA only  . Depression   . Diastolic CHF, chronic (HCC)    grade I, most recent echo Jan 2019  . Fatty liver   . GERD (gastroesophageal reflux disease)   . Hyperlipidemia   . Hypertension   . Hyponatremia    multiple episodes with encephalopathy, renal thinks SIADH  . Hypothyroidism, unspecified   . Internal hemorrhoid   . NSTEMI (non-ST elevated myocardial infarction) (HCC) 08/28/2016  . Palpitations   . PFO with atrial septal aneurysm    TTE 2015  . Pneumonia 2016  . Severe pulmonary arterial systolic hypertension (HCC) 12/25/2017   per echo, on 1L oxgen via [place]    SOCIAL HX:  Social History   Tobacco Use  . Smoking status: Never Smoker  . Smokeless tobacco: Former Neurosurgeon    Types: Snuff  . Tobacco comment: "used snuff when I was 19"  Substance Use Topics  . Alcohol use: No    ALLERGIES:  Allergies  Allergen Reactions  . Crestor [Rosuvastatin Calcium] Other (See Comments)    Muscle Aches  . Keflex [Cephalexin] Other (See Comments)    dizziness  . Peanut Butter Flavor Hives  . Sulfa Antibiotics Hives  . Cephalexin Other (See Comments)    dizziness  . Sulfa Drugs Cross Reactors Rash     PERTINENT MEDICATIONS:  Outpatient Encounter Medications as of 02/19/2019  Medication Sig  . albuterol (PROVENTIL) (2.5 MG/3ML) 0.083% nebulizer solution Take 3 mLs (2.5 mg total) by nebulization every 4 (four) hours as needed for wheezing or shortness of breath.  . ALPRAZolam (XANAX) 0.5 MG tablet Take 1 tablet (0.5 mg total) by mouth 2 (two) times daily as needed for anxiety.  Marland Kitchen amLODipine (NORVASC) 10 MG tablet Take 1 tablet (10 mg total) by mouth daily.  . Aspirin 81 MG EC tablet Take 81 mg by mouth  daily.  . cetirizine (ZYRTEC) 10 MG tablet Take 10 mg by mouth daily.  Marland Kitchen docusate sodium (COLACE) 100 MG capsule Take 100 mg by mouth daily as needed for mild constipation.  . famotidine (PEPCID) 20 MG tablet Take 1 tablet (20 mg total) by mouth daily. Take 1 tablet at Bedtime daily. (Patient not taking: Reported on 12/20/2018)  . fluticasone (FLONASE) 50 MCG/ACT nasal spray Place 1 spray into both nostrils 2 (two) times daily.  Marland Kitchen GENERLAC 10 GM/15ML SOLN TAKE 15 MLS BY MOUTH TWICE DAILY AS NEEDED FOR MODERATE CONSTIPATION  . glycerin adult 2 g suppository Place 1 suppository rectally as needed for constipation.  Marland Kitchen levothyroxine (SYNTHROID, LEVOTHROID) 50 MCG tablet Take 1 tablet (50 mcg total) by mouth daily before  breakfast.  . meclizine (ANTIVERT) 12.5 MG tablet Take 1 tablet by mouth as needed for dizziness  . olopatadine (PATANOL) 0.1 % ophthalmic solution Place 1 drop into both eyes daily as needed for allergies.  Marland Kitchen omeprazole (PRILOSEC) 20 MG capsule Take 20 mg by mouth every evening.   . propranolol ER (INDERAL LA) 60 MG 24 hr capsule Take 1 capsule (60 mg total) by mouth daily.   No facility-administered encounter medications on file as of 02/19/2019.       Franki Stemen Marlena Clipper, NP

## 2019-02-19 NOTE — Telephone Encounter (Signed)
Left message about screening before visit.  Son called back a few minutes later and did phone screening.  Everything negative Amy K. Garner Nash NP

## 2019-02-20 MED ORDER — ALPRAZOLAM 1 MG PO TABS: 1.0000 mg | ORAL_TABLET | Freq: Two times a day (BID) | ORAL | 3 refills | Status: DC | PRN

## 2019-02-20 NOTE — Telephone Encounter (Signed)
Tell her to take 2 of the pills if she needs 1mg . There are refills on the medication as well.  Also advise her to stay home. The refill will be for a 1mg  dose.

## 2019-02-20 NOTE — Telephone Encounter (Signed)
Left message on voicemail to return office call Eather Colas). Dgaddy, CMA

## 2019-02-23 ENCOUNTER — Ambulatory Visit: Payer: Medicare Other | Admitting: Family Medicine

## 2019-02-25 DIAGNOSIS — I5032 Chronic diastolic (congestive) heart failure: Secondary | ICD-10-CM | POA: Diagnosis not present

## 2019-02-25 DIAGNOSIS — I69354 Hemiplegia and hemiparesis following cerebral infarction affecting left non-dominant side: Secondary | ICD-10-CM | POA: Diagnosis not present

## 2019-02-25 DIAGNOSIS — R1310 Dysphagia, unspecified: Secondary | ICD-10-CM | POA: Diagnosis not present

## 2019-02-25 DIAGNOSIS — I69391 Dysphagia following cerebral infarction: Secondary | ICD-10-CM | POA: Diagnosis not present

## 2019-02-25 DIAGNOSIS — I2510 Atherosclerotic heart disease of native coronary artery without angina pectoris: Secondary | ICD-10-CM | POA: Diagnosis not present

## 2019-02-25 DIAGNOSIS — I110 Hypertensive heart disease with heart failure: Secondary | ICD-10-CM | POA: Diagnosis not present

## 2019-02-25 DIAGNOSIS — L89312 Pressure ulcer of right buttock, stage 2: Secondary | ICD-10-CM | POA: Diagnosis not present

## 2019-02-25 DIAGNOSIS — J9611 Chronic respiratory failure with hypoxia: Secondary | ICD-10-CM | POA: Diagnosis not present

## 2019-02-25 DIAGNOSIS — E222 Syndrome of inappropriate secretion of antidiuretic hormone: Secondary | ICD-10-CM | POA: Diagnosis not present

## 2019-02-25 DIAGNOSIS — J449 Chronic obstructive pulmonary disease, unspecified: Secondary | ICD-10-CM | POA: Diagnosis not present

## 2019-02-25 DIAGNOSIS — J9612 Chronic respiratory failure with hypercapnia: Secondary | ICD-10-CM | POA: Diagnosis not present

## 2019-02-26 ENCOUNTER — Encounter: Payer: Self-pay | Admitting: Family Medicine

## 2019-02-28 ENCOUNTER — Encounter: Payer: Self-pay | Admitting: Family Medicine

## 2019-02-28 NOTE — Telephone Encounter (Signed)
Please call [facility] and see if they are still going out for routine care. She is supposed to have a lab drawn this wed or Thursday. Please call patient and let them know what [facility] says. thanks

## 2019-03-02 ENCOUNTER — Telehealth: Payer: Self-pay | Admitting: Adult Health Nurse Practitioner

## 2019-03-02 DIAGNOSIS — I6789 Other cerebrovascular disease: Secondary | ICD-10-CM | POA: Diagnosis not present

## 2019-03-02 DIAGNOSIS — J9601 Acute respiratory failure with hypoxia: Secondary | ICD-10-CM | POA: Diagnosis not present

## 2019-03-02 DIAGNOSIS — B9562 Methicillin resistant Staphylococcus aureus infection as the cause of diseases classified elsewhere: Secondary | ICD-10-CM | POA: Diagnosis not present

## 2019-03-02 DIAGNOSIS — R7881 Bacteremia: Secondary | ICD-10-CM | POA: Diagnosis not present

## 2019-03-02 NOTE — Telephone Encounter (Signed)
Spoke with son, [name], to see if wanted to change Thursday appointment to telehealth.  States does not have that capability but would like someone to come out to see her as she is having more swelling in left foot.  Will continue with face to face meeting on 03/05/2019.  Informed son to expect call in morning prior to visit for COVID screening. Kahla Risdon K. Garner Nash NP

## 2019-03-03 MED ORDER — NYSTATIN 100000 UNIT/GM EX CREA: 1.0000 "application " | TOPICAL_CREAM | Freq: Two times a day (BID) | CUTANEOUS | 2 refills | Status: DC

## 2019-03-04 ENCOUNTER — Encounter: Payer: Self-pay | Admitting: Family Medicine

## 2019-03-05 ENCOUNTER — Telehealth: Payer: Self-pay | Admitting: Adult Health Nurse Practitioner

## 2019-03-05 ENCOUNTER — Other Ambulatory Visit: Payer: Self-pay

## 2019-03-05 ENCOUNTER — Other Ambulatory Visit: Payer: Medicare Other | Admitting: Adult Health Nurse Practitioner

## 2019-03-05 DIAGNOSIS — Z515 Encounter for palliative care: Secondary | ICD-10-CM

## 2019-03-05 NOTE — Telephone Encounter (Signed)
Left VM that was going to do COVID 19 screening prior to visit. Left contact info Jailine Lieder K. Garner Nash NP

## 2019-03-05 NOTE — Progress Notes (Signed)
Therapist, nutritional Palliative Care Consult Note Telephone: [phone]  Fax: [phone]  PATIENT NAME: Tammy Fernandez DOB: December 07, 1923 MRN: [id number]  PRIMARY CARE PROVIDER:   Myles Lipps, MD  REFERRING PROVIDER:  Myles Lipps, MD [address] [place], [place] [address]  RESPONSIBLE PARTY:   Pandora Balcazar, son, [phone]     RECOMMENDATIONS and PLAN:  1.  Blood pressure.  Son is concerned that his mother's BP has been going too low.  Patient is currently on Propranolol ER 60 mg daily and amlodipine 10 mg daily.  Patient concerned that if she does not take her BP meds that her BP will go too high.  She has had relatives that have died due to high BP and she does not want that to happen to her.  Educated her that too low BP can lead to dizziness and increased risk of falls.  Patient has her own electronic wrist BP cuff.  Have instructed her and son to take BP and heart rate 3 times a day (first thing in the morning, before lunch and at bedtime) for next 2 weeks.  Left parameters to hold BP meds if SBP 110 or lower and/or DBP 60 or lower.  Have also left parameter to hold propranolol if heart rate 60 or lower.  Did instruct to take BP meds if BP raises throughout the day if held that morning.  Educated that this will give [place] an idea how her BP is running throughout the day over a period time in case any changes need to be made to her BP meds.  Also discussed this with her daughter over the phone.  Does state having some swelling in her left foot especially if she eats something salty  2. GERD.  Patient is currently on ranitidine for GERD.  Son concerned over recent claims that this medication causes cancer.  Would like it changed to prilosec or tagamet.  For ease of mind and decreased anxiety, the switch could be considered if patient tolerates  3. Dyspnea/OSA.  Patient has no complaints of SOB today.  Has some cough associated with allergies.  Does state having  a feeling like something is in her right ear, which also could be related to her allergies.  Son does state that her CPAP tubing has been crimped at night and there is a piece missing that may be causing it to crimp.  States that he is going to call the medical supply to get it fixed.  Does state that at times she wears the CPAP for more than 4 hours but other times not as long.  Also states that she has not been wearing it during the day as she has not been napping during the day.  Previously discussed wearing it some through the day if she naps.  Continued to educate the importance of wearing it as much as possible.  4.  Goals of care.  Did not get to do any discussion on this today.  Son did mention that she used to get food stamps but now does not.  States that having to follow a more strict diet for her heart and having to get things that are softer for her to eat that her grocery bill is around $200-300 a month and it is hard having the money for it.  Wondering if Dr. Leretha Pol wrote her a letter to give to Social Services if that would help.  Told him I was unsure if this would  help but would ask.     I spent 95 minutes providing this consultation,  from 11:30 to 1:05. More than 50% of the time in this consultation was spent coordinating communication.   HISTORY OF PRESENT ILLNESS:  Tammy Fernandez is a 85 y.o. year old female with multiple medical problems including pulmonary hypertension, COPD, OSA, SIADH. Palliative Care was asked to help address goals of care.   CODE STATUS: full code  PPS: 50% HOSPICE ELIGIBILITY/DIAGNOSIS: TBD  PHYSICAL EXAM:   General: patient sitting up on couch in NAD Cardiovascular: regular rate and rhythm Pulmonary: lung sounds diminished but no abnormal breath sounds heard, normal respiratory effort. O2 @ 95% on 2L Abdomen: soft, nontender, + bowel sounds Extremities:trace nonpitting edema to left foot, no joint deformities Skin: no rashes Neurological:  Weakness but otherwise nonfocal  PAST MEDICAL HISTORY:  Past Medical History:  Diagnosis Date  . Anxiety   . Chronic lower back pain   . Constipation    h/o fecal disimpaction on 2017  . CVA (cerebral vascular accident) (HCC) 2015   neg workup, MRI small acute lacunar infarcts. ASA only  . Depression   . Diastolic CHF, chronic (HCC)    grade I, most recent echo Jan 2019  . Fatty liver   . GERD (gastroesophageal reflux disease)   . Hyperlipidemia   . Hypertension   . Hyponatremia    multiple episodes with encephalopathy, renal thinks SIADH  . Hypothyroidism, unspecified   . Internal hemorrhoid   . NSTEMI (non-ST elevated myocardial infarction) (HCC) 08/28/2016  . Palpitations   . PFO with atrial septal aneurysm    TTE 2015  . Pneumonia 2016  . Severe pulmonary arterial systolic hypertension (HCC) 12/25/2017   per echo, on 1L oxgen via [place]    SOCIAL HX:  Social History   Tobacco Use  . Smoking status: Never Smoker  . Smokeless tobacco: Former Neurosurgeon    Types: Snuff  . Tobacco comment: "used snuff when I was 19"  Substance Use Topics  . Alcohol use: No    ALLERGIES:  Allergies  Allergen Reactions  . Crestor [Rosuvastatin Calcium] Other (See Comments)    Muscle Aches  . Keflex [Cephalexin] Other (See Comments)    dizziness  . Peanut Butter Flavor Hives  . Sulfa Antibiotics Hives  . Cephalexin Other (See Comments)    dizziness  . Sulfa Drugs Cross Reactors Rash     PERTINENT MEDICATIONS:  Outpatient Encounter Medications as of 03/05/2019  Medication Sig  . albuterol (PROVENTIL) (2.5 MG/3ML) 0.083% nebulizer solution Take 3 mLs (2.5 mg total) by nebulization every 4 (four) hours as needed for wheezing or shortness of breath.  . ALPRAZolam (XANAX) 1 MG tablet Take 1 tablet (1 mg total) by mouth 2 (two) times daily as needed for anxiety.  Marland Kitchen amLODipine (NORVASC) 10 MG tablet Take 1 tablet (10 mg total) by mouth daily.  . Aspirin 81 MG EC tablet Take 81 mg by mouth  daily.  . cetirizine (ZYRTEC) 10 MG tablet Take 10 mg by mouth daily.  Marland Kitchen docusate sodium (COLACE) 100 MG capsule Take 100 mg by mouth daily as needed for mild constipation.  . famotidine (PEPCID) 20 MG tablet Take 1 tablet (20 mg total) by mouth daily. Take 1 tablet at Bedtime daily. (Patient not taking: Reported on 12/20/2018)  . fluticasone (FLONASE) 50 MCG/ACT nasal spray Place 1 spray into both nostrils 2 (two) times daily.  Marland Kitchen GENERLAC 10 GM/15ML SOLN TAKE 15 MLS BY MOUTH  TWICE DAILY AS NEEDED FOR MODERATE CONSTIPATION  . glycerin adult 2 g suppository Place 1 suppository rectally as needed for constipation.  Marland Kitchen levothyroxine (SYNTHROID, LEVOTHROID) 50 MCG tablet Take 1 tablet (50 mcg total) by mouth daily before breakfast.  . meclizine (ANTIVERT) 12.5 MG tablet Take 1 tablet by mouth as needed for dizziness  . nystatin cream (MYCOSTATIN) Apply 1 application topically 2 (two) times daily.  Marland Kitchen olopatadine (PATANOL) 0.1 % ophthalmic solution Place 1 drop into both eyes daily as needed for allergies.  Marland Kitchen omeprazole (PRILOSEC) 20 MG capsule Take 20 mg by mouth every evening.   . propranolol ER (INDERAL LA) 60 MG 24 hr capsule Take 1 capsule (60 mg total) by mouth daily.   No facility-administered encounter medications on file as of 03/05/2019.      Wallace Gappa Marlena Clipper, NP

## 2019-03-05 NOTE — Telephone Encounter (Signed)
Please give verbal order for one more BMP dx hyponatremia, chronic respiratory failure. thanks

## 2019-03-05 NOTE — Telephone Encounter (Signed)
Patient's son called back and had a negative COVID 19 screening asked to move appointment to 11:30.  Will see patient at 11:30. Keah Lamba K. Garner Nash NP

## 2019-03-06 ENCOUNTER — Encounter: Payer: Self-pay | Admitting: Family Medicine

## 2019-03-06 ENCOUNTER — Telehealth: Payer: Self-pay

## 2019-03-06 NOTE — Telephone Encounter (Signed)
Per provider message, l/m for Selena Batten, nurse at [facility] with verbal order for BMP with dx of hyponatremia and Chronic Resp Failure.

## 2019-03-09 DIAGNOSIS — I5032 Chronic diastolic (congestive) heart failure: Secondary | ICD-10-CM | POA: Diagnosis not present

## 2019-03-09 DIAGNOSIS — I69354 Hemiplegia and hemiparesis following cerebral infarction affecting left non-dominant side: Secondary | ICD-10-CM | POA: Diagnosis not present

## 2019-03-09 DIAGNOSIS — J449 Chronic obstructive pulmonary disease, unspecified: Secondary | ICD-10-CM | POA: Diagnosis not present

## 2019-03-09 DIAGNOSIS — R1310 Dysphagia, unspecified: Secondary | ICD-10-CM | POA: Diagnosis not present

## 2019-03-09 DIAGNOSIS — I2510 Atherosclerotic heart disease of native coronary artery without angina pectoris: Secondary | ICD-10-CM | POA: Diagnosis not present

## 2019-03-09 DIAGNOSIS — J9612 Chronic respiratory failure with hypercapnia: Secondary | ICD-10-CM | POA: Diagnosis not present

## 2019-03-09 DIAGNOSIS — I69391 Dysphagia following cerebral infarction: Secondary | ICD-10-CM | POA: Diagnosis not present

## 2019-03-09 DIAGNOSIS — I110 Hypertensive heart disease with heart failure: Secondary | ICD-10-CM | POA: Diagnosis not present

## 2019-03-09 DIAGNOSIS — J9611 Chronic respiratory failure with hypoxia: Secondary | ICD-10-CM | POA: Diagnosis not present

## 2019-03-09 DIAGNOSIS — E222 Syndrome of inappropriate secretion of antidiuretic hormone: Secondary | ICD-10-CM | POA: Diagnosis not present

## 2019-03-09 DIAGNOSIS — L89312 Pressure ulcer of right buttock, stage 2: Secondary | ICD-10-CM | POA: Diagnosis not present

## 2019-03-12 ENCOUNTER — Telehealth: Payer: Self-pay | Admitting: Family Medicine

## 2019-03-12 NOTE — Telephone Encounter (Signed)
Copied from CRM [id number]. Topic: Quick Communication - See Telephone Encounter >> Mar 12, 2019 10:25 AM Terisa Starr wrote: CRM for notification. See Telephone encounter for: 03/12/19.  Selena Batten, RN with [facility] called and said she wanted to let Doyne Keel know that they attempted 3 times to draw her labs and they had to d/c her this week. She wants to know how you want to proceed with her labs. She can be reached at [phone]

## 2019-03-13 NOTE — Telephone Encounter (Signed)
Please see note below FYI 

## 2019-03-16 ENCOUNTER — Telehealth: Payer: Self-pay | Admitting: Adult Health Nurse Practitioner

## 2019-03-16 NOTE — Telephone Encounter (Signed)
Spoke with son about patient concerns for BP going too high.  States that the highest reading she has had before taking her BP meds was 126/87 and that he has been giving her half of her amlodipine because he doesn't want her BP to go to low.  Spoke with patient on phone as well to reassure her that her Bps are within normal range.  Son states she also had a fall a couple days ago in which she hit her mouth.  He has been applying ice and giving Tylenol for the pain which has been effective.  He is unsure as to what caused the fall.  She had told him at the time of the fall that her leg gave way and she slipped on the floor. Will be having telehealth visit this Thursday, 03/19/2019, at 11:30 to further discuss her blood pressures. Lauro Manlove K. Garner Nash NP

## 2019-03-17 NOTE — Telephone Encounter (Signed)
If she is getting encephalopathic (ie having mental status changes) then she needs to be evaluated in the ER ([facility]) as she will probably have to be admitted.  Thanks

## 2019-03-17 NOTE — Telephone Encounter (Signed)
Pt son [name] is calling and he would like md to send someone else out to draw his mother blood. The last visit they was not able to draw the blood. Pt son will address the mychart message also

## 2019-03-18 ENCOUNTER — Other Ambulatory Visit: Payer: Self-pay | Admitting: Family Medicine

## 2019-03-18 MED ORDER — AMLODIPINE BESYLATE 5 MG PO TABS: 5.0000 mg | ORAL_TABLET | Freq: Every day | ORAL | 1 refills | Status: DC

## 2019-03-18 NOTE — Telephone Encounter (Signed)
Spoke with son, [name], and patient No concerns for encephalopathy Discussed that clinically she is stable, no indication for labs at this time, risks outweight benefits. Keep working on use of mask every night No recent aspiration, good appetite Had a fall, tripped, doing ok, swollen lip, putting ice Having intermittent memory loss, short term No headaches, no vision changes, no nausea or vomiting BP doing ok, continue with amlodipine 5mg  once a day

## 2019-03-18 NOTE — Telephone Encounter (Signed)
This was addressed via MyChart.  Pt has been advised to go to ED.

## 2019-03-19 ENCOUNTER — Other Ambulatory Visit: Payer: Medicare Other | Admitting: Adult Health Nurse Practitioner

## 2019-03-19 ENCOUNTER — Telehealth: Payer: Self-pay | Admitting: Adult Health Nurse Practitioner

## 2019-03-19 ENCOUNTER — Other Ambulatory Visit: Payer: Self-pay

## 2019-03-19 DIAGNOSIS — Z515 Encounter for palliative care: Secondary | ICD-10-CM

## 2019-03-19 NOTE — Telephone Encounter (Signed)
Returned call from son. Son stated patient concerned about blood pressure and having a stroke and wanting to go to hospital.  Blood was to be drawn last week and [facility] nurse was able to stick patient to get blood work done.  Patient also concerned about this and wanting to go to ER to have the blood work drawn.  On top of all this patient dealing with loss of granddaughter due to opioid overdose.  Reassured patient that her BP readings have been WNL and that they were not going to accept her at the hospital right now for blood work due to COVID 19.  Patient does not want to go to hospital but has concerns.  Continued to reassure patient that she was not having any signs or symptoms of a stroke after talking with her on phone.  Have a telehealth appointment set up for tomorrow and will further assess and discuss then. Kynley Metzger K. Garner Nash NP

## 2019-03-19 NOTE — Progress Notes (Signed)
[facility], [facility] Consult Note Telephone: [phone]  Fax: [phone]  PATIENT NAME: Tammy Fernandez DOB: Mar 30, 1924 MRN: [id number]  PRIMARY CARE PROVIDER:   Myles LippsSantiago, Irma M, MD  REFERRING PROVIDER:  Myles LippsSantiago, Irma M, MD [address] [place], [place] [id number]  RESPONSIBLE PARTY:     Due to the COVID-19 crisis, this visit was done via telemedicine and it was initiated and consent by this patient and or family.  Video-audio (telehealth) contact was unable to be done due technical barriers from the patient's side.  Did attempt telehealth via Zoom but had difficulties getting it set up.  RECOMMENDATIONS and PLAN:  1.  Blood pressure.  Patient recorded BP TID for 2 weeks.  Her lowest BP was 101/54 in the morning and highest was 155/69.  Mostly ranged in the 120s and 130s over 50s and 60s throughout the day.  Son has been halfing her dose of amlodipine and this seems to help keep her BP from going too low.  Would recommend continuing giving amlodipine 5mg  daily instead of 10 mg.   Patient is concerned about having a stroke and have reassured her that her BP has been running WNL and she is not having any signs or symptoms of stroke or MI.  Denies change in mentation (has forgetfulness, but son states no more than usual), chest pain, facial drooping, cough, SOB, slurred speech, excessive sleepiness.  2.  Patient did have a fall over the weekend and hit her mouth.  Lip was swollen but son has been having her apply ice.  Patient and son state that the swelling has gone down a lot.  Patient did say her head was sore afterwards but this was relieved with Tylenol and the soreness has mostly gone away now.  Son reports no changes in mentation and patient has been doing her usual activity.   3.  Dyspnea.  Denies any increased SOB or DOE.  Has been using her CPAP for 4 hours every night.  Still encourage to use more but this seems to be all the patient can tolerate.   Does state that seasonal allergies are making her feel like she has a cold but she knows it is just her allergies. Does take flonase and zyrtec for her allergies  4.  GERD.  Patient recently taken off ranitidine due to reports of it causing cancer.  Unsure as to what patient takes in morning and at night. Pepcid and prilosec are listed in med list but son states that she is taking prevacid and tagamet  States that she has been having increased reflux symptoms at night.  Recommend increasing night time dose of reflux med.    5.  Constipation.  Patient states that she did have a good BM this morning but gets constipated at times.  She currently is taking colace.  Suggested that she could take Miralax or milk of magnesia as needed if she hasn't had a BM in 3 days.  As the constipation is intermittent would not want to recommend it daily as it could cause diarrhea  6.  Depression/Anxiety.  Patient has had several losses in past few weeks.  A close family friend passed away 02/24/2019, one her granddaughters passed away this week and another granddaughter had a stroke.  I feel like a lot of anxiety over her health is due in part to these losses.  Son reports that she does not have any increased depression, tearfulness, or anxiety.   I  spent 45 minutes providing this consultation,  from 11:30 to 12:15. More than 50% of the time in this consultation was spent coordinating communication.   HISTORY OF PRESENT ILLNESS:  ALVIN RUBANO is a 85 y.o. year old female with multiple medical problems including pulmonary hypertension, COPD, OSA, SIADH. Palliative Care was asked to help address goals of care.   CODE STATUS: Full code  PPS: 50% HOSPICE ELIGIBILITY/DIAGNOSIS: TBD  PAST MEDICAL HISTORY:  Past Medical History:  Diagnosis Date  . Anxiety   . Chronic lower back pain   . Constipation    h/o fecal disimpaction on 2017  . CVA (cerebral vascular accident) (HCC) 2015   neg workup, MRI small acute lacunar  infarcts. ASA only  . Depression   . Diastolic CHF, chronic (HCC)    grade I, most recent echo Jan 2019  . Fatty liver   . GERD (gastroesophageal reflux disease)   . Hyperlipidemia   . Hypertension   . Hyponatremia    multiple episodes with encephalopathy, renal thinks SIADH  . Hypothyroidism, unspecified   . Internal hemorrhoid   . NSTEMI (non-ST elevated myocardial infarction) (HCC) 08/28/2016  . Palpitations   . PFO with atrial septal aneurysm    TTE 2015  . Pneumonia 2016  . Severe pulmonary arterial systolic hypertension (HCC) 12/25/2017   per echo, on 1L oxgen via [place]    SOCIAL HX:  Social History   Tobacco Use  . Smoking status: Never Smoker  . Smokeless tobacco: Former Neurosurgeon    Types: Snuff  . Tobacco comment: "used snuff when I was 19"  Substance Use Topics  . Alcohol use: No    ALLERGIES:  Allergies  Allergen Reactions  . Crestor [Rosuvastatin Calcium] Other (See Comments)    Muscle Aches  . Keflex [Cephalexin] Other (See Comments)    dizziness  . Peanut Butter Flavor Hives  . Sulfa Antibiotics Hives  . Cephalexin Other (See Comments)    dizziness  . Sulfa Drugs Cross Reactors Rash     PERTINENT MEDICATIONS:  Outpatient Encounter Medications as of 03/19/2019  Medication Sig  . albuterol (PROVENTIL) (2.5 MG/3ML) 0.083% nebulizer solution Take 3 mLs (2.5 mg total) by nebulization every 4 (four) hours as needed for wheezing or shortness of breath.  . ALPRAZolam (XANAX) 1 MG tablet Take 1 tablet (1 mg total) by mouth 2 (two) times daily as needed for anxiety.  Marland Kitchen amLODipine (NORVASC) 5 MG tablet Take 1 tablet (5 mg total) by mouth daily.  . Aspirin 81 MG EC tablet Take 81 mg by mouth daily.  . cetirizine (ZYRTEC) 10 MG tablet Take 10 mg by mouth daily.  Marland Kitchen docusate sodium (COLACE) 100 MG capsule Take 100 mg by mouth daily as needed for mild constipation.  . famotidine (PEPCID) 20 MG tablet Take 1 tablet (20 mg total) by mouth daily. Take 1 tablet at Bedtime  daily. (Patient not taking: Reported on 12/20/2018)  . fluticasone (FLONASE) 50 MCG/ACT nasal spray Place 1 spray into both nostrils 2 (two) times daily.  Marland Kitchen GENERLAC 10 GM/15ML SOLN TAKE 15 MLS BY MOUTH TWICE DAILY AS NEEDED FOR MODERATE CONSTIPATION  . glycerin adult 2 g suppository Place 1 suppository rectally as needed for constipation.  Marland Kitchen levothyroxine (SYNTHROID, LEVOTHROID) 50 MCG tablet Take 1 tablet (50 mcg total) by mouth daily before breakfast.  . meclizine (ANTIVERT) 12.5 MG tablet Take 1 tablet by mouth as needed for dizziness  . nystatin cream (MYCOSTATIN) Apply 1 application topically 2 (two)  times daily.  Marland Kitchen olopatadine (PATANOL) 0.1 % ophthalmic solution Place 1 drop into both eyes daily as needed for allergies.  Marland Kitchen omeprazole (PRILOSEC) 20 MG capsule Take 20 mg by mouth every evening.   . propranolol ER (INDERAL LA) 60 MG 24 hr capsule Take 1 capsule (60 mg total) by mouth daily.   No facility-administered encounter medications on file as of 03/19/2019.       Foy Mungia Marlena Clipper, NP

## 2019-03-24 ENCOUNTER — Ambulatory Visit (INDEPENDENT_AMBULATORY_CARE_PROVIDER_SITE_OTHER): Payer: Medicare Other | Admitting: Gastroenterology

## 2019-03-24 ENCOUNTER — Encounter: Payer: Self-pay | Admitting: Gastroenterology

## 2019-03-24 ENCOUNTER — Telehealth: Payer: Self-pay

## 2019-03-24 ENCOUNTER — Other Ambulatory Visit: Payer: Self-pay

## 2019-03-24 VITALS — BP 130/60 | HR 72

## 2019-03-24 DIAGNOSIS — R1310 Dysphagia, unspecified: Secondary | ICD-10-CM

## 2019-03-24 NOTE — Progress Notes (Signed)
This service was provided via virtual visit.  Only audio was used.  The patient was located at home.  I was located in my office.  The patient did consent to this virtual visit and is aware of possible charges through their insurance for this visit.  The patient is an established patient.  My certified medical assistant Barbaraann Rondo contributed to this visit by contacting the patient by phone 1 or 2 business days prior to the appointment and also followed up on the recommendations I made after the visit.  Her son was on the phone as well  Time spent on virtual visit: 41 min   HPI: This is a very pleasant 85 year old woman whom I last saw when she was in the hospital back in January for a metabolic derangement with hyponatremia.  Swallowing is much better than while in the hospital. Originally on purree after d/c but in the last 4 weeks she's eating normal, solid foods.  Her son cannot detect that she is having any real issues.  Not having to drink water after every bite any more.  Her weight is going up, probably has gained weight because she's eating so much.    No longer having to puree her food at all now.  Her mental status is much more clear as well now.  She is very aware of how she is doing.  She is currently involved in home palliative care.  I last saw her about 7 years ago at that time she was switching care to Dr. Madilyn Fireman through [facility] gastroenterology   Previous review of records from Dr. Madilyn Fireman. Colonoscopy June 2003 done for Hemoccult-positive stool and abnormal barium enema showed internal hemorrhoids without masses or polyps. Upper endoscopy October 2007 done for dyspepsia was completely normal. The duodenum was biopsied and it was normal as well she had a gastric emptying scan April 2000 and 12th and this was normal also she had an abdominal ultrasound May 2011 and her liver and gallbladder were normal.     Barium esophagram January 2020 1. Presbyesophagus with poor primary  esophageal stripping wave and tertiary wave contractions. 2. Not able to evaluate the gastroesophageal junction as contrast would not traverse beyond this region despite dry swallowing, ingestion of water, change of patient position and delayed imaging.  Clinical Impression MBSS 12/2018 Suboptimal view secondary to pt's shoulder elevated and blocking view at times.  Pt presents with mild oral dysphagia and overall functional pharyngeal swallow.  Decreased lingual strength/coordination results in decreased oral manipulation/cohesion and lingual pumping with premature spillage of boluses into pharynx.     Pharyngeal swallow largely intact with only mild laryngeal penetration.  Compensation strategies not tested due to pt's decreased ability to follow directions.  Pt did not aspirate despite being challenged to sequential boluses of thin.  Trace laryngeal penetration did not clear with cued cough/throat clear.  Pt appeared with secretions retained in pharynx.  Oral secretions removed by this SlP prior to barium administration.      Dependent on results of esophagram, recommend dys3/ground meats/extra gravy/sauces and thin liquids.  Medications with puree - whole.  SLP will follow up for family/pt education.  Suspect primary source of pt's dysphagia at this time is her mentation.     Blood work February 2020 hemoglobin 9.0, MCV 97, basic metabolic profile normal  We saw her while she was hospitalized 1-2/2020for hyponatremia, hypotension, metabolic encephalopathy.  She did have some dysphasia and we could not rule out structural lesions such as  stricture however with her confusion, encephalopathy, poor metabolic status we elected not to do invasive procedure and simply see how she recovered.  Chief complaint is dysphasia  ROS: complete GI ROS as described in HPI, all other review negative.  Constitutional:  No unintentional weight loss   Past Medical History:  Diagnosis Date  . Anxiety    . Chronic lower back pain   . Constipation    h/o fecal disimpaction on 2017  . CVA (cerebral vascular accident) (HCC) 2015   neg workup, MRI small acute lacunar infarcts. ASA only  . Depression   . Diastolic CHF, chronic (HCC)    grade I, most recent echo Jan 2019  . Fatty liver   . GERD (gastroesophageal reflux disease)   . Hyperlipidemia   . Hypertension   . Hyponatremia    multiple episodes with encephalopathy, renal thinks SIADH  . Hypothyroidism, unspecified   . Internal hemorrhoid   . NSTEMI (non-ST elevated myocardial infarction) (HCC) 08/28/2016  . Palpitations   . PFO with atrial septal aneurysm    TTE 2015  . Pneumonia 2016  . Severe pulmonary arterial systolic hypertension (HCC) 12/25/2017   per echo, on 1L oxgen via [place]    Past Surgical History:  Procedure Laterality Date  . ABDOMINAL HYSTERECTOMY    . CATARACT EXTRACTION W/ INTRAOCULAR LENS  IMPLANT, BILATERAL Bilateral   . CESAREAN SECTION    . TEE WITHOUT CARDIOVERSION N/A 01/19/2014   Procedure: TRANSESOPHAGEAL ECHOCARDIOGRAM (TEE);  Surgeon: Laurey Morale, MD;  [facility];  Service: Cardiovascular;  Laterality: N/A;  [name]  . VAGINAL HYSTERECTOMY      Current Outpatient Medications  Medication Sig Dispense Refill  . albuterol (PROVENTIL) (2.5 MG/3ML) 0.083% nebulizer solution Take 3 mLs (2.5 mg total) by nebulization every 4 (four) hours as needed for wheezing or shortness of breath. 75 mL 5  . ALPRAZolam (XANAX) 1 MG tablet Take 1 tablet (1 mg total) by mouth 2 (two) times daily as needed for anxiety. 60 tablet 3  . amLODipine (NORVASC) 5 MG tablet Take 1 tablet (5 mg total) by mouth daily. 90 tablet 1  . Aspirin 81 MG EC tablet Take 81 mg by mouth daily.    . cetirizine (ZYRTEC) 10 MG tablet Take 10 mg by mouth daily.    Marland Kitchen docusate sodium (COLACE) 100 MG capsule Take 100 mg by mouth daily as needed for mild constipation.    . famotidine (PEPCID) 20 MG tablet Take 1 tablet (20 mg total)  by mouth daily. Take 1 tablet at Bedtime daily. 90 tablet 0  . fluticasone (FLONASE) 50 MCG/ACT nasal spray Place 1 spray into both nostrils 2 (two) times daily. 16 g 5  . GENERLAC 10 GM/15ML SOLN TAKE 15 MLS BY MOUTH TWICE DAILY AS NEEDED FOR MODERATE CONSTIPATION 3034 mL 1  . glycerin adult 2 g suppository Place 1 suppository rectally as needed for constipation. 12 suppository 2  . levothyroxine (SYNTHROID, LEVOTHROID) 50 MCG tablet Take 1 tablet (50 mcg total) by mouth daily before breakfast. 30 tablet 5  . meclizine (ANTIVERT) 12.5 MG tablet Take 1 tablet by mouth as needed for dizziness 30 tablet 0  . nystatin cream (MYCOSTATIN) Apply 1 application topically 2 (two) times daily. 30 g 2  . olopatadine (PATANOL) 0.1 % ophthalmic solution Place 1 drop into both eyes daily as needed for allergies. 5 mL 5  . omeprazole (PRILOSEC) 20 MG capsule Take 20 mg by mouth every evening.     Marland Kitchen  propranolol ER (INDERAL LA) 60 MG 24 hr capsule Take 1 capsule (60 mg total) by mouth daily. 90 capsule 1   No current facility-administered medications for this visit.     Allergies as of 03/24/2019 - Review Complete 03/24/2019  Allergen Reaction Noted  . Crestor [rosuvastatin calcium] Other (See Comments) 08/28/2016  . Keflex [cephalexin] Other (See Comments) 08/28/2016  . Peanut butter flavor Hives 01/20/2014  . Sulfa antibiotics Hives 08/28/2016  . Cephalexin Other (See Comments) 06/03/2014  . Sulfa drugs cross reactors Rash 08/03/2011    Family History  Problem Relation Age of Onset  . Stroke Father   . Hypertension Father   . Colon cancer Cousin   . Colon cancer Other        Aunt  . Diabetes Other        aunt and cousins  . Heart disease Other        cousins  . Healthy Daughter   . Healthy Son     Social History   Socioeconomic History  . Marital status: Widowed    Spouse name: Not on file  . Number of children: 7  . Years of education: Not on file  . Highest education level: Not on  file  Occupational History  . Not on file  Social Needs  . Financial resource strain: Not on file  . Food insecurity:    Worry: Not on file    Inability: Not on file  . Transportation needs:    Medical: Not on file    Non-medical: Not on file  Tobacco Use  . Smoking status: Never Smoker  . Smokeless tobacco: Former Neurosurgeon    Types: Snuff  . Tobacco comment: "used snuff when I was 19"  Substance and Sexual Activity  . Alcohol use: No  . Drug use: No  . Sexual activity: Not Currently  Lifestyle  . Physical activity:    Days per week: Not on file    Minutes per session: Not on file  . Stress: Not on file  Relationships  . Social connections:    Talks on phone: Not on file    Gets together: Not on file    Attends religious service: Not on file    Active member of club or organization: Not on file    Attends meetings of clubs or organizations: Not on file    Relationship status: Not on file  . Intimate partner violence:    Fear of current or ex partner: Not on file    Emotionally abused: Not on file    Physically abused: Not on file    Forced sexual activity: Not on file  Other Topics Concern  . Not on file  Social History Narrative   ** Merged History Encounter **       Patient lives at home with her son           Physical Exam: Unable to perform because this was a "telemed visit" due to current Covid-19 pandemic  Assessment and plan: 85 y.o. female with improved dysphasia  She had some significant dysphasia during her hospitalization for hyponatremia, mental status changes, metabolic encephalopathy 3 months ago.  Her dysphasia is much much better over the past 3 months since she was discharged.  She is eating everything she wants to eat including solid foods.  She is no longer needing to pure her food.  Her son agrees with this.  She has been gaining weight.  Her mental status is also much  more clear.  Given her age, frailty, I recommended no further evaluation of  her previous dysphasia unless her symptoms return.  She and her son know to call if she has any future issues.  She will stay on proton pump inhibitor for now for her chronic GERD.  Please see the "Patient Instructions" section for addition details about the plan.  Rob Buntinganiel Lilac Hoff, MD [facility] 03/24/2019, 8:24 AM

## 2019-03-24 NOTE — Telephone Encounter (Signed)
Virtual Visit Pre-Appointment Phone Call  "Mrs Troyan, I am calling you today to discuss your upcoming appointment. We are currently trying to limit exposure to the virus that causes COVID-19 by seeing patients at home rather than in the office."  1. "What is the BEST phone number to call the day of the visit?" - include this in appointment notes  2. "Do you have or have access to (through a family member/friend) a smartphone with video capability that we can use for your visit?" a. If yes - list this number in appt notes as "cell" (if different from BEST phone #) and list the appointment type as a VIDEO visit in appointment notes b. If no - list the appointment type as a PHONE visit in appointment notes  3. Confirm consent - "In the setting of the current Covid19 crisis, you are scheduled for a PHONE visit with your provider on 03/25/2019 at 2:00PM.  Just as we do with many in-office visits, in order for you to participate in this visit, we must obtain consent.  If you'd like, I can send this to your mychart (if signed up) or email for you to review.  Otherwise, I can obtain your verbal consent now.  All virtual visits are billed to your insurance company just like a normal visit would be.  By agreeing to a virtual visit, we'd like you to understand that the technology does not allow for your provider to perform an examination, and thus may limit your provider's ability to fully assess your condition. If your provider identifies any concerns that need to be evaluated in person, we will make arrangements to do so.  Finally, though the technology is pretty good, we cannot assure that it will always work on either your or our end, and in the setting of a video visit, we may have to convert it to a phone-only visit.  In either situation, we cannot ensure that we have a secure connection.  Are you willing to proceed?" STAFF: Did the patient verbally acknowledge consent to telehealth visit? Document  YES/NO here: YES  4. Advise patient to be prepared - "Two hours prior to your appointment, go ahead and check your blood pressure, pulse, oxygen saturation, and your weight (if you have the equipment to check those) and write them all down. When your visit starts, your provider will ask you for this information. If you have an Apple Watch or Kardia device, please plan to have heart rate information ready on the day of your appointment. Please have a pen and paper handy nearby the day of the visit as well."  5. Give patient instructions for MyChart download to smartphone OR Doximity/Doxy.me as below if video visit (depending on what platform provider is using)  6. Inform patient they will receive a phone call 15 minutes prior to their appointment time (may be from unknown caller ID) so they should be prepared to answer    TELEPHONE CALL NOTE  Rhiannon L Cardarelli has been deemed a candidate for a follow-up tele-health visit to limit community exposure during the Covid-19 pandemic. I spoke with the patient via phone to ensure availability of phone/video source, confirm preferred email & phone number, and discuss instructions and expectations.  I reminded Alayshia L Sedgwick to be prepared with any vital sign and/or heart rhythm information that could potentially be obtained via home monitoring, at the time of her visit. I reminded [name] to expect a phone call prior to her  visit.  Lucita FerraraYoung, Najee Manninen T, CMA 03/24/2019 4:09 PM   INSTRUCTIONS FOR DOWNLOADING THE MYCHART APP TO SMARTPHONE  - The patient must first make sure to have activated MyChart and know their login information - If Apple, go to [employer] Store and type in MyChart in the search bar and download the app. If Android, ask patient to go to [employer] Play Store and type in [facility] in the search bar and download the app. The app is free but as with any other app downloads, their phone may require them to verify saved payment information or  Apple/Android password.  - The patient will need to then log into the app with their MyChart username and password, and select [facility] as their healthcare provider to link the account. When it is time for your visit, go to the MyChart app, find appointments, and click Begin Video Visit. Be sure to Select Allow for your device to access the Microphone and Camera for your visit. You will then be connected, and your provider will be with you shortly.  **If they have any issues connecting, or need assistance please contact MyChart service desk [phone])**  **If using a computer, in order to ensure the best quality for their visit they will need to use either of the following Internet Browsers: [employer] Edge, or Google Chrome**  IF USING DOXIMITY or DOXY.ME - The patient will receive a link just prior to their visit by text.     FULL LENGTH CONSENT FOR TELE-HEALTH VISIT   I hereby voluntarily request, consent and authorize [facility] and its employed or contracted physicians, physician assistants, nurse practitioners or other licensed health care professionals (the Practitioner), to provide me with telemedicine health care services (the "Services") as deemed necessary by the treating Practitioner. I acknowledge and consent to receive the Services by the Practitioner via telemedicine. I understand that the telemedicine visit will involve communicating with the Practitioner through live audiovisual communication technology and the disclosure of certain medical information by electronic transmission. I acknowledge that I have been given the opportunity to request an in-person assessment or other available alternative prior to the telemedicine visit and am voluntarily participating in the telemedicine visit.  I understand that I have the right to withhold or withdraw my consent to the use of telemedicine in the course of my care at any time, without affecting my right to future care  or treatment, and that the Practitioner or I may terminate the telemedicine visit at any time. I understand that I have the right to inspect all information obtained and/or recorded in the course of the telemedicine visit and may receive copies of available information for a reasonable fee.  I understand that some of the potential risks of receiving the Services via telemedicine include:  Marland Kitchen. Delay or interruption in medical evaluation due to technological equipment failure or disruption; . Information transmitted may not be sufficient (e.g. poor resolution of images) to allow for appropriate medical decision making by the Practitioner; and/or  . In rare instances, security protocols could fail, causing a breach of personal health information.  Furthermore, I acknowledge that it is my responsibility to provide information about my medical history, conditions and care that is complete and accurate to the best of my ability. I acknowledge that Practitioner's advice, recommendations, and/or decision may be based on factors not within their control, such as incomplete or inaccurate data provided by me or distortions of diagnostic images or specimens that may result from electronic transmissions. I  understand that the practice of medicine is not an exact science and that Practitioner makes no warranties or guarantees regarding treatment outcomes. I acknowledge that I will receive a copy of this consent concurrently upon execution via email to the email address I last provided but may also request a printed copy by calling the office of [facility].    I understand that my insurance will be billed for this visit.   I have read or had this consent read to me. . I understand the contents of this consent, which adequately explains the benefits and risks of the Services being provided via telemedicine.  . I have been provided ample opportunity to ask questions regarding this consent and the Services and have had  my questions answered to my satisfaction. . I give my informed consent for the services to be provided through the use of telemedicine in my medical care  By participating in this telemedicine visit I agree to the above.

## 2019-03-24 NOTE — Patient Instructions (Addendum)
She and her son know that they should call if she has any return of swallowing troubles.  She will continue proton pump inhibitor now for chronic GERD.

## 2019-03-24 NOTE — Telephone Encounter (Signed)
Spoke with son an advised I was f/u on phone call I made to advise pt to take 2 of the xanax 0.5 mg if she needs.  Advised there are refills on medication, advised for her to stay home and she will send refill for 1 mg tab.  Son verbalizes understanding and agreeable. Dgaddy, CMA

## 2019-03-25 ENCOUNTER — Telehealth (INDEPENDENT_AMBULATORY_CARE_PROVIDER_SITE_OTHER): Payer: Medicare Other | Admitting: Cardiology

## 2019-03-25 ENCOUNTER — Encounter: Payer: Self-pay | Admitting: Cardiology

## 2019-03-25 ENCOUNTER — Telehealth: Payer: Self-pay

## 2019-03-25 VITALS — BP 142/64 | HR 70 | Ht 62.0 in | Wt 161.0 lb

## 2019-03-25 DIAGNOSIS — I2721 Secondary pulmonary arterial hypertension: Secondary | ICD-10-CM

## 2019-03-25 DIAGNOSIS — I10 Essential (primary) hypertension: Secondary | ICD-10-CM

## 2019-03-25 DIAGNOSIS — J9611 Chronic respiratory failure with hypoxia: Secondary | ICD-10-CM

## 2019-03-25 DIAGNOSIS — Z8673 Personal history of transient ischemic attack (TIA), and cerebral infarction without residual deficits: Secondary | ICD-10-CM

## 2019-03-25 NOTE — Progress Notes (Signed)
Virtual Visit via Telephone Note   This visit type was conducted due to national recommendations for restrictions regarding the COVID-19 Pandemic (e.g. social distancing) in an effort to limit this patient's exposure and mitigate transmission in our community.  Due to her co-morbid illnesses, this patient is at least at moderate risk for complications without adequate follow up.  This format is felt to be most appropriate for this patient at this time.  The patient did not have access to video technology/had technical difficulties with video requiring transitioning to audio format only (telephone).  All issues noted in this document were discussed and addressed.  No physical exam could be performed with this format.  Please refer to the patient's chart for her  consent to telehealth for [facility].  Evaluation Performed:  Follow-up visit  This visit type was conducted due to national recommendations for restrictions regarding the COVID-19 Pandemic (e.g. social distancing).  This format is felt to be most appropriate for this patient at this time.  All issues noted in this document were discussed and addressed.  No physical exam was performed (except for noted visual exam findings with Video Visits).  Please refer to the patient's chart (MyChart message for video visits and phone note for telephone visits) for the patient's consent to telehealth for [facility].  Date:  03/25/2019   ID:  Tammy Fernandez, DOB December 27, 1923, MRN [id number]  Patient Location: Home [address] [place] [address]   Provider location:   Home- [place]  PCP:  Myles Lipps, MD  Cardiologist:  Dr Allyson Sabal Electrophysiologist:  None   Chief Complaint:  Follow up post hospital visit  History of Present Illness:    Tammy Fernandez is a 85 y.o. female who presents via audio/video conferencing for a telehealth visit today.    She is a delightful 85 y/o female who we saw in the office seen June 2019 to  be established.   She had been seen by Dr Sharyn Lull in 2014 for chest pain, low risk Myoview then. She had a TEE in 2015 when she was being worked up for a stroke. Dr Elease Hashimoto saw her once in 2017 when she presented with SSCP and had mildly elevated Troponin. He felt she was not a candidate for for further work up. She has no history of documented CAD.    In Jan 2019 she presented with respiratory failure failure felt to be secondary to diastolic CHF and pulmonary HTN. She was also markedly hyponatremic then. Echo Jan 2019 showed EF 60-65% with grade 2 DD and significant pulmonary HTN with a PA pressure 72 mmHg. She is on chronic O2. Her mobility is limited, mainly wheel chair but she does do some walking with a cane.  She was admitted through the ED 12/27/2018-to 01/08/2019 with lethargy and confusion.  She was markedly hyponatremic and hypertensive.  She apparently had not been eating well at home and not taking her medications.  She had a complicated hospitalization.  She was seen by the nephrology service, the GI service, the neurology service, and CCM.  Her course was complicated by hypercarbia, aspiration, and hospital-acquired pneumonia.  She was seen by palliative care.  The patient and her family do not wish her to be a DNR.  Initially the plans were to discharge her to a skilled nursing facility but the family wanted to take her home.    She is actually thrived at home.  She has been getting physical therapy and  they have since dismissed her since she is walking with a cane now.  Dr Leretha Pol id following her closely.   I spoke with the patient's son Tammy Fernandez.  He says she is eating well and putting on weight.  She is taking all her medications and he monitors this.  She had been on a higher dose of amlodipine but he felt it was dropping her blood pressure too low and this was cut back to 5 mg a day.  He has been monitoring her B/P since.  Her blood pressure seems to be running about 140 systolic over 80  diastolic.  I spoke with the patient on the phone as well.  She was in no acute distress and coherent.  She denied any unusual shortness of breath or chest pain.  She did complain of "gas" under her left rib cage.  The patient does not symptoms concerning for COVID-19 infection (fever, chills, cough, or new SHORTNESS OF BREATH).    Prior CV studies:   The following studies were reviewed today:  Past Medical History:  Diagnosis Date  . Anxiety   . Chronic lower back pain   . Constipation    h/o fecal disimpaction on 2017  . CVA (cerebral vascular accident) (HCC) 2015   neg workup, MRI small acute lacunar infarcts. ASA only  . Depression   . Diastolic CHF, chronic (HCC)    grade I, most recent echo Jan 2019  . Fatty liver   . GERD (gastroesophageal reflux disease)   . Hyperlipidemia   . Hypertension   . Hyponatremia    multiple episodes with encephalopathy, renal thinks SIADH  . Hypothyroidism, unspecified   . Internal hemorrhoid   . NSTEMI (non-ST elevated myocardial infarction) (HCC) 08/28/2016  . Palpitations   . PFO with atrial septal aneurysm    TTE 2015  . Pneumonia 2016  . Severe pulmonary arterial systolic hypertension (HCC) 12/25/2017   per echo, on 1L oxgen via [place]   Past Surgical History:  Procedure Laterality Date  . ABDOMINAL HYSTERECTOMY    . CATARACT EXTRACTION W/ INTRAOCULAR LENS  IMPLANT, BILATERAL Bilateral   . CESAREAN SECTION    . TEE WITHOUT CARDIOVERSION N/A 01/19/2014   Procedure: TRANSESOPHAGEAL ECHOCARDIOGRAM (TEE);  Surgeon: Laurey Morale, MD;  Location[facility] ENDOSCOPY;  Service: Cardiovascular;  Laterality: N/A;  [name]  . VAGINAL HYSTERECTOMY       Current Meds  Medication Sig  . albuterol (PROVENTIL) (2.5 MG/3ML) 0.083% nebulizer solution Take 3 mLs (2.5 mg total) by nebulization every 4 (four) hours as needed for wheezing or shortness of breath.  . ALPRAZolam (XANAX) 1 MG tablet Take 1 tablet (1 mg total) by mouth 2 (two) times daily as  needed for anxiety.  Marland Kitchen amLODipine (NORVASC) 5 MG tablet Take 1 tablet (5 mg total) by mouth daily.  . Aspirin 81 MG EC tablet Take 81 mg by mouth daily.  . cetirizine (ZYRTEC) 10 MG tablet Take 10 mg by mouth daily.  Marland Kitchen demeclocycline (DECLOMYCIN) 300 MG tablet Take 150 mg by mouth 2 (two) times a day.  . docusate sodium (COLACE) 100 MG capsule Take 100 mg by mouth daily as needed for mild constipation.  . fluticasone (FLONASE) 50 MCG/ACT nasal spray Place 1 spray into both nostrils 2 (two) times daily.  Marland Kitchen glycerin adult 2 g suppository Place 1 suppository rectally as needed for constipation.  Marland Kitchen levothyroxine (SYNTHROID, LEVOTHROID) 50 MCG tablet Take 1 tablet (50 mcg total) by mouth daily before breakfast.  .  meclizine (ANTIVERT) 12.5 MG tablet Take 1 tablet by mouth as needed for dizziness  . nystatin cream (MYCOSTATIN) Apply 1 application topically 2 (two) times daily.  Marland Kitchen olopatadine (PATANOL) 0.1 % ophthalmic solution Place 1 drop into both eyes daily as needed for allergies.  Marland Kitchen omeprazole (PRILOSEC) 20 MG capsule Take 20 mg by mouth every evening.   . propranolol ER (INDERAL LA) 60 MG 24 hr capsule Take 1 capsule (60 mg total) by mouth daily.     Allergies:   Crestor [rosuvastatin calcium]; Keflex [cephalexin]; Peanut butter flavor; Sulfa antibiotics; Cephalexin; and Sulfa drugs cross reactors   Social History   Tobacco Use  . Smoking status: Never Smoker  . Smokeless tobacco: Former Neurosurgeon    Types: Snuff  . Tobacco comment: "used snuff when I was 19"  Substance Use Topics  . Alcohol use: No  . Drug use: No     Family Hx: The patient's family history includes Colon cancer in her cousin and another family member; Diabetes in an other family member; Healthy in her daughter and son; Heart disease in an other family member; Hypertension in her father; Stroke in her father.  ROS:   Please see the history of present illness.    All other systems reviewed and are negative.    Labs/Other Tests and Data Reviewed:    Recent Labs: 12/27/2018: B Natriuretic Peptide 175.8 12/28/2018: TSH 0.647 01/01/2019: Magnesium 2.2 01/07/2019: Hemoglobin 9.0; Platelets 258 02/13/2019: ALT CANCELED; BUN CANCELED; Creatinine, Ser CANCELED; Potassium CANCELED; Sodium CANCELED   Recent Lipid Panel Lab Results  Component Value Date/Time   CHOL 181 08/29/2016 04:34 AM   TRIG 98 08/29/2016 04:34 AM   HDL 37 (L) 08/29/2016 04:34 AM   CHOLHDL 4.9 08/29/2016 04:34 AM   LDLCALC 124 (H) 08/29/2016 04:34 AM    Wt Readings from Last 3 Encounters:  03/25/19 161 lb (73 kg)  03/23/19 170 lb (77.1 kg)  12/28/18 173 lb 11.6 oz (78.8 kg)     Exam:    Vital Signs:  BP (!) 142/64   Pulse 70   Ht  (1.575 m)   Wt 161 lb (73 kg)   SpO2 100%   BMI 29.45 kg/m    Pt in no acute distress on the phone today  ASSESSMENT & PLAN:    Acute on chronic respiratory failure with hypoxia (HCC) Followed by Dr Marchelle Gearing- pt has severe pulmonary HTN and is on chronic O2.  History of cest pain, atypical Localized Rt chest pain, low risk Myoview 2014 (Dr Sharyn Lull) . Not felt to be a candidate for further testing when admitted in Sept 2017 (Dr Elease Hashimoto).  Chronic diastolic CHF (congestive heart failure) (HCC) Echo Jan 2019 - EF 60-65% with grade 2 DD and severe pulmonary HTN (PA 72 mmHg)  Acute hyponatremia Jan 2019 and again Jan 2020. She has been on chronic Demeclocycline   History of CVA (cerebrovascular accident) H/O CVA 2015  Severe pulmonary arterial systolic hypertension (HCC) PA pressure 72 mmHg  Abnormal EKG Chronic inferior-lateral TWI  COVID-19 Education: The signs and symptoms of COVID-19 were discussed with the patient and how to seek care for testing (follow up with PCP or arrange E-visit).  The importance of social distancing was discussed today.  Patient Risk:   After full review of this patients clinical status, I feel that they are at least moderate risk at this  time.  Time:   Today, I have spent 25 minutes with the patient and her son  with telehealth technology discussing her medications and recent hospitalization.     Medication Adjustments/Labs and Tests Ordered: Current medicines are reviewed at length with the patient today.  Concerns regarding medicines are outlined above.  Tests Ordered: No orders of the defined types were placed in this encounter.  Medication Changes: No orders of the defined types were placed in this encounter.   Disposition:  Dr Allyson SabalBerry in August  Signed, Emanuele Mcwhirter, [place]-C  03/25/2019 2:25 PM    [facility]

## 2019-03-25 NOTE — Patient Instructions (Addendum)
Medication Instructions:  Take Gas Ex as needed for gas pain  If you need a refill on your cardiac medications before your next appointment, please call your pharmacy.   Lab work: None  If you have labs (blood work) drawn today and your tests are completely normal, you will receive your results only by: Marland Kitchen MyChart Message (if you have MyChart) OR . A paper copy in the mail If you have any lab test that is abnormal or we need to change your treatment, we will call you to review the results.  Testing/Procedures: None   Follow-Up: At [facility], you and your health needs are our priority.  As part of our continuing mission to provide you with exceptional heart care, we have created designated Provider Care Teams.  These Care Teams include your primary Cardiologist (physician) and Advanced Practice Providers (APPs -  Physician Assistants and Nurse Practitioners) who all work together to provide you with the care you need, when you need it. You will need a follow up appointment in 4 months.  Please call our office 2 months in advance to schedule this appointment.  You may see Dr Nanetta Batty or one of the following Advanced Practice Providers on your designated Care Team:   Corine Shelter, PA-C Judy Pimple, [place] . Marjie Skiff, PA-C  Any Other Special Instructions Will Be Listed Below (If Applicable).

## 2019-03-25 NOTE — Telephone Encounter (Signed)
Contacted patient to review AVS instructions. Patient voiced understanding.

## 2019-03-28 ENCOUNTER — Other Ambulatory Visit: Payer: Self-pay | Admitting: Family Medicine

## 2019-03-28 DIAGNOSIS — R42 Dizziness and giddiness: Secondary | ICD-10-CM

## 2019-03-28 NOTE — Telephone Encounter (Signed)
Please review

## 2019-03-30 ENCOUNTER — Telehealth (INDEPENDENT_AMBULATORY_CARE_PROVIDER_SITE_OTHER): Payer: Medicare Other | Admitting: Family Medicine

## 2019-03-30 ENCOUNTER — Telehealth: Payer: Self-pay

## 2019-03-30 ENCOUNTER — Other Ambulatory Visit: Payer: Self-pay

## 2019-03-30 DIAGNOSIS — J9611 Chronic respiratory failure with hypoxia: Secondary | ICD-10-CM

## 2019-03-30 DIAGNOSIS — J302 Other seasonal allergic rhinitis: Secondary | ICD-10-CM | POA: Diagnosis not present

## 2019-03-30 DIAGNOSIS — E222 Syndrome of inappropriate secretion of antidiuretic hormone: Secondary | ICD-10-CM | POA: Diagnosis not present

## 2019-03-30 DIAGNOSIS — I2721 Secondary pulmonary arterial hypertension: Secondary | ICD-10-CM | POA: Diagnosis not present

## 2019-03-30 DIAGNOSIS — G5601 Carpal tunnel syndrome, right upper limb: Secondary | ICD-10-CM | POA: Diagnosis not present

## 2019-03-30 NOTE — Patient Instructions (Signed)
Phenylephrine 10mg  by mouth every 4 mouths. (oral decongestant)

## 2019-03-30 NOTE — Progress Notes (Signed)
Virtual Visit Note  I connected with patient on 03/30/19 at 254 pm by phone due to tech limitations and verified that I am speaking with the correct person using two identifiers. Tammy Fernandez is currently located at home and patient and her son is currently with them during visit. The provider, Myles LippsIrma M Santiago, MD is located in their office at time of visit.  I discussed the limitations, risks, security and privacy concerns of performing an evaluation and management service by telephone and the availability of in person appointments. I also discussed with the patient that there may be a patient responsible charge related to this service. The patient expressed understanding and agreed to proceed.   CC: followup  HPI ? Patient is a 85 y.o. female with past medical history significant for hyponatremia2/2 SIADH, constipation, HTN, dCHF, pHTN on oxygen, CVA, GERD and seasonal allergies who presents today for routine followup  Last OV March 2020 Seen by GI and cards - stable, improved, no changes in regime Palliative care involved  Spoke with son and patient Last night O2 down to 78% while she sleeping and wearing her mask, seems there was a kink on the cord, otherwise she has been doing well with using the mask Pressure has been dropped from 400 to 250, [employer] They did not hear the alarm Eating well, mentation is doing well, no CP, no SOB Oxygen 2L a day Walking every day, doing chair exercises every day Feet have been swelling when she has been eating higher sodium food 2 salt tabs BID Amlodipine 5mg  once a day Swelling is around her ankles, not tight, wakes up are normal BP well controlled Having continued ear pressure, sinus pressure, using allergy meds, has been using sweet oil on cotton ball Having numbness of thumb, right, has known carpal tunnel Has been sleeping with braces which is helping  Allergies  Allergen Reactions  . Crestor [Rosuvastatin Calcium] Other (See  Comments)    Muscle Aches  . Keflex [Cephalexin] Other (See Comments)    dizziness  . Peanut Butter Flavor Hives  . Sulfa Antibiotics Hives  . Cephalexin Other (See Comments)    dizziness  . Sulfa Drugs Cross Reactors Rash    Prior to Admission medications   Medication Sig Start Date End Date Taking? Authorizing Provider  albuterol (PROVENTIL) (2.5 MG/3ML) 0.083% nebulizer solution Take 3 mLs (2.5 mg total) by nebulization every 4 (four) hours as needed for wheezing or shortness of breath. 10/08/18   Icard, Rachel BoBradley L, DO  ALPRAZolam (XANAX) 1 MG tablet Take 1 tablet (1 mg total) by mouth 2 (two) times daily as needed for anxiety. 02/20/19   Doristine BosworthStallings, Zoe A, MD  amLODipine (NORVASC) 5 MG tablet Take 1 tablet (5 mg total) by mouth daily. 03/18/19   Myles LippsSantiago, Osha Errico M, MD  Aspirin 81 MG EC tablet Take 81 mg by mouth daily.    [provider]  cetirizine (ZYRTEC) 10 MG tablet Take 10 mg by mouth daily.    [provider]  demeclocycline (DECLOMYCIN) 300 MG tablet Take 150 mg by mouth 2 (two) times a day. 03/11/19   [provider]  docusate sodium (COLACE) 100 MG capsule Take 100 mg by mouth daily as needed for mild constipation.    [provider]  fluticasone (FLONASE) 50 MCG/ACT nasal spray Place 1 spray into both nostrils 2 (two) times daily. 02/16/19   Myles LippsSantiago, Andrae Claunch M, MD  glycerin adult 2 g suppository Place 1 suppository rectally as needed  for constipation. 01/27/19   Myles Lipps, MD  levothyroxine (SYNTHROID, LEVOTHROID) 50 MCG tablet Take 1 tablet (50 mcg total) by mouth daily before breakfast. 09/16/18   Myles Lipps, MD  meclizine (ANTIVERT) 12.5 MG tablet Take 1 tablet by mouth as needed for dizziness 02/13/19   Myles Lipps, MD  nystatin cream (MYCOSTATIN) Apply 1 application topically 2 (two) times daily. 03/03/19   Myles Lipps, MD  olopatadine (PATANOL) 0.1 % ophthalmic solution Place 1 drop into both eyes daily as needed for allergies.  02/16/19   Myles Lipps, MD  omeprazole (PRILOSEC) 20 MG capsule Take 20 mg by mouth every evening.  12/09/18   [provider]  propranolol ER (INDERAL LA) 60 MG 24 hr capsule Take 1 capsule (60 mg total) by mouth daily. 02/17/19   Myles Lipps, MD    Past Medical History:  Diagnosis Date  . Anxiety   . Chronic lower back pain   . Constipation    h/o fecal disimpaction on 2017  . CVA (cerebral vascular accident) (HCC) 2015   neg workup, MRI small acute lacunar infarcts. ASA only  . Depression   . Diastolic CHF, chronic (HCC)    grade I, most recent echo Jan 2019  . Fatty liver   . GERD (gastroesophageal reflux disease)   . Hyperlipidemia   . Hypertension   . Hyponatremia    multiple episodes with encephalopathy, renal thinks SIADH  . Hypothyroidism, unspecified   . Internal hemorrhoid   . NSTEMI (non-ST elevated myocardial infarction) (HCC) 08/28/2016  . Palpitations   . PFO with atrial septal aneurysm    TTE 2015  . Pneumonia 2016  . Severe pulmonary arterial systolic hypertension (HCC) 12/25/2017   per echo, on 1L oxgen via [place]    Past Surgical History:  Procedure Laterality Date  . ABDOMINAL HYSTERECTOMY    . CATARACT EXTRACTION W/ INTRAOCULAR LENS  IMPLANT, BILATERAL Bilateral   . CESAREAN SECTION    . TEE WITHOUT CARDIOVERSION N/A 01/19/2014   Procedure: TRANSESOPHAGEAL ECHOCARDIOGRAM (TEE);  Surgeon: Laurey Morale, MD;  [facility] ENDOSCOPY;  Service: Cardiovascular;  Laterality: N/A;  [name]  . VAGINAL HYSTERECTOMY      Social History   Tobacco Use  . Smoking status: Never Smoker  . Smokeless tobacco: Former Neurosurgeon    Types: Snuff  . Tobacco comment: "used snuff when I was 19"  Substance Use Topics  . Alcohol use: No    Family History  Problem Relation Age of Onset  . Stroke Father   . Hypertension Father   . Colon cancer Cousin   . Colon cancer Other        Aunt  . Diabetes Other        aunt and cousins  . Heart disease Other         cousins  . Healthy Daughter   . Healthy Son     ROS Per hpi  Objective  Vitals as reported by the patient: none   ASSESSMENT and PLAN  1. SIADH (syndrome of inappropriate ADH production) (HCC) 2. Severe pulmonary arterial systolic hypertension (HCC) 3. Chronic respiratory failure with hypoxia (HCC) 4. Seasonal allergies 5. Carpal tunnel syndrome of right wrist  Overall stable. Advised reach out to [employer] re alarm on system, etc. Provided as much reassurance as possible regarding not checking her BMP. Continue with current regime. Discussed cont supportive measures for carpal tunnel. Trial of OTC decongestant. Reviewed RTC precautions.  FOLLOW-UP: 4 weeks   The above assessment and management plan was discussed with the patient. The patient verbalized understanding of and has agreed to the management plan. Patient is aware to call the clinic if symptoms persist or worsen. Patient is aware when to return to the clinic for a follow-up visit. Patient educated on when it is appropriate to go to the emergency department.    I provided 32 minutes of non-face-to-face time during this encounter.  Myles Lipps, MD Primary Care at [facility] [address] [place], [place] [address] Ph.  [phone] Fax [phone]

## 2019-03-30 NOTE — Telephone Encounter (Signed)
PCP office called to inquire if palliative care was making home visits at this time. PCP office made aware that visits are being completed via telehealth. Patient's oxygen level dropped last night into the 70's. Palliative NP updated.

## 2019-03-30 NOTE — Progress Notes (Signed)
she is concerned about the mask that she uses for sleeping. Says her o2 droped to 78 last night which causes headaches. She is also having swelling in the feet and ankles when she eats anything with salt. She says she is not adding any xtra salt to the food. She has changed her diet to eat all fresh foods to cut down on the swelling

## 2019-03-31 ENCOUNTER — Telehealth: Payer: Self-pay | Admitting: Family Medicine

## 2019-03-31 NOTE — Telephone Encounter (Signed)
Copied from CRM [id number]. Topic: Quick Communication - See Telephone Encounter >> Mar 31, 2019 10:03 AM Aretta Nip wrote: CRM for notification. See Telephone encounter for: 03/31/19. [place] services  DSScalled (Brandi) at [phone] called pertaining to Dr. Kathie Rhodes. Application for advocate for food and nutrition for [place]. They need either [place] to call them or Breanne's information be provide by Dr Leretha Pol.

## 2019-04-02 ENCOUNTER — Other Ambulatory Visit: Payer: Self-pay

## 2019-04-02 ENCOUNTER — Other Ambulatory Visit: Payer: Medicare Other | Admitting: Adult Health Nurse Practitioner

## 2019-04-02 DIAGNOSIS — B9562 Methicillin resistant Staphylococcus aureus infection as the cause of diseases classified elsewhere: Secondary | ICD-10-CM | POA: Diagnosis not present

## 2019-04-02 DIAGNOSIS — Z515 Encounter for palliative care: Secondary | ICD-10-CM | POA: Diagnosis not present

## 2019-04-02 DIAGNOSIS — R7881 Bacteremia: Secondary | ICD-10-CM | POA: Diagnosis not present

## 2019-04-02 DIAGNOSIS — I6789 Other cerebrovascular disease: Secondary | ICD-10-CM | POA: Diagnosis not present

## 2019-04-02 DIAGNOSIS — J9601 Acute respiratory failure with hypoxia: Secondary | ICD-10-CM | POA: Diagnosis not present

## 2019-04-02 NOTE — Progress Notes (Signed)
[facility] Consult Note Telephone: [phone]  Fax: [phone]  PATIENT NAME: Tammy Fernandez DOB: [birth year] MRN: [id number]  PRIMARY CARE PROVIDER:   Myles LippsSantiago, Irma M, MD  REFERRING PROVIDER:  Myles LippsSantiago, Irma M, MD [address] [place], [place] [address]  RESPONSIBLE PARTY:   Mare FerrariJerome Bar, son [phone]  Due to the COVID-19 crisis, this visit was done via telemedicine and it was initiated and consent by this patient and or family.  Video-audio (telehealth) contact was unable to be done due technical barriers from the patient's side.  Did attempt telehealth via Zoom but had difficulties getting it set up.  They could see me but I could not see them.       RECOMMENDATIONS and PLAN:  1.  Blood pressure.  Patient's BP has been improved and staying WNL with decrease in amlodipine.  Continue BP meds at current doses.  Does state that she will have increased edema when she eats salt but the swelling will go down when she props up her feet or when she is in bed.  She states that she does not add salt to her food.  Have instructed to also look at food labels for sodium content as well.  She expressed understanding.  2.  Dyspnea/OSA.  Denies any increased SOB or DOE.  Has been using her CPAP for 4 hours every night.  Still encourage to use more but this seems to be all the patient can tolerate.  Patient states that she was told that if she didn't use it for at least 4 hours a night that she would have to pay for the CPAP machine or return.  Son states that he is trying to slowly increase her time on the machine. Does state that she does have a few intermittent episodes of confusion.  3.  GERD.  Son states that she is currently on taking Prilosec 20 mg in the morning for reflux.  Patient still complaining of reflux symptoms in the evening.  Have instructed her to take prilosec 20 mg at night as well for better relief   4.  Allergies.  Patient  has seasonal allergies and takes flonase and zyrtec for this.  States that Dr. Leretha PolSantiago suggested something OTC and he is waiting on an update in my chart with the name of the medication.  He could not remember what it was.  Will reach out to Dr. Leretha PolSantiago for confirmation on this medication.    I spent 40 minutes providing this consultation,  from 11:40 to 12:20. More than 50% of the time in this consultation was spent coordinating communication.   HISTORY OF PRESENT ILLNESS:  Tammy Fernandez is a 85 y.o. year old female with multiple medical problems including pulmonary hypertension, COPD, OSA, SIADH. Palliative Care was asked to help address goals of care.   CODE STATUS: Full Code  PPS: 50% HOSPICE ELIGIBILITY/DIAGNOSIS: TBD  PAST MEDICAL HISTORY:  Past Medical History:  Diagnosis Date  . Anxiety   . Chronic lower back pain   . Constipation    h/o fecal disimpaction on 2017  . CVA (cerebral vascular accident) (HCC) 2015   neg workup, MRI small acute lacunar infarcts. ASA only  . Depression   . Diastolic CHF, chronic (HCC)    grade I, most recent echo Jan 2019  . Fatty liver   . GERD (gastroesophageal reflux disease)   . Hyperlipidemia   . Hypertension   . Hyponatremia    multiple  episodes with encephalopathy, renal thinks SIADH  . Hypothyroidism, unspecified   . Internal hemorrhoid   . NSTEMI (non-ST elevated myocardial infarction) (HCC) 08/28/2016  . Palpitations   . PFO with atrial septal aneurysm    TTE 2015  . Pneumonia 2016  . Severe pulmonary arterial systolic hypertension (HCC) 12/25/2017   per echo, on 1L oxgen via [place]    SOCIAL HX:  Social History   Tobacco Use  . Smoking status: Never Smoker  . Smokeless tobacco: Former Neurosurgeon    Types: Snuff  . Tobacco comment: "used snuff when I was 19"  Substance Use Topics  . Alcohol use: No    ALLERGIES:  Allergies  Allergen Reactions  . Crestor [Rosuvastatin Calcium] Other (See Comments)    Muscle Aches  .  Keflex [Cephalexin] Other (See Comments)    dizziness  . Peanut Butter Flavor Hives  . Sulfa Antibiotics Hives  . Cephalexin Other (See Comments)    dizziness  . Sulfa Drugs Cross Reactors Rash     PERTINENT MEDICATIONS:  Outpatient Encounter Medications as of 04/02/2019  Medication Sig  . albuterol (PROVENTIL) (2.5 MG/3ML) 0.083% nebulizer solution Take 3 mLs (2.5 mg total) by nebulization every 4 (four) hours as needed for wheezing or shortness of breath.  . ALPRAZolam (XANAX) 1 MG tablet Take 1 tablet (1 mg total) by mouth 2 (two) times daily as needed for anxiety.  Marland Kitchen amLODipine (NORVASC) 5 MG tablet Take 1 tablet (5 mg total) by mouth daily.  . Aspirin 81 MG EC tablet Take 81 mg by mouth daily.  . cetirizine (ZYRTEC) 10 MG tablet Take 10 mg by mouth daily.  Marland Kitchen demeclocycline (DECLOMYCIN) 300 MG tablet Take 150 mg by mouth 2 (two) times a day.  . docusate sodium (COLACE) 100 MG capsule Take 100 mg by mouth daily as needed for mild constipation.  . fluticasone (FLONASE) 50 MCG/ACT nasal spray Place 1 spray into both nostrils 2 (two) times daily.  Marland Kitchen glycerin adult 2 g suppository Place 1 suppository rectally as needed for constipation.  Marland Kitchen levothyroxine (SYNTHROID, LEVOTHROID) 50 MCG tablet Take 1 tablet (50 mcg total) by mouth daily before breakfast.  . meclizine (ANTIVERT) 12.5 MG tablet TAKE 1 TABLET BY MOUTH AS NEEDED FOR DIZZINESS.  Marland Kitchen nystatin cream (MYCOSTATIN) Apply 1 application topically 2 (two) times daily.  Marland Kitchen olopatadine (PATANOL) 0.1 % ophthalmic solution Place 1 drop into both eyes daily as needed for allergies.  Marland Kitchen omeprazole (PRILOSEC) 20 MG capsule Take 20 mg by mouth every evening.   . propranolol ER (INDERAL LA) 60 MG 24 hr capsule Take 1 capsule (60 mg total) by mouth daily.   No facility-administered encounter medications on file as of 04/02/2019.      Kordae Buonocore Marlena Clipper, NP

## 2019-04-03 ENCOUNTER — Other Ambulatory Visit: Payer: Self-pay | Admitting: Family Medicine

## 2019-04-03 MED ORDER — OMEPRAZOLE 20 MG PO CPDR: 20.0000 mg | DELAYED_RELEASE_CAPSULE | Freq: Two times a day (BID) | ORAL | 3 refills | Status: DC

## 2019-04-03 MED ORDER — PHENYLEPHRINE HCL 10 MG PO TABS: 10.0000 mg | ORAL_TABLET | ORAL | 3 refills | Status: DC | PRN

## 2019-04-03 NOTE — Telephone Encounter (Signed)
Spoke with Merry Proud and she ended up mailing information to patient and said our assistance was not needed.

## 2019-04-16 ENCOUNTER — Ambulatory Visit: Payer: Medicare Other | Admitting: Family Medicine

## 2019-04-17 ENCOUNTER — Other Ambulatory Visit: Payer: Self-pay

## 2019-04-17 ENCOUNTER — Telehealth: Payer: Self-pay | Admitting: Family Medicine

## 2019-04-17 ENCOUNTER — Other Ambulatory Visit: Payer: Medicare Other | Admitting: Adult Health Nurse Practitioner

## 2019-04-17 DIAGNOSIS — Z515 Encounter for palliative care: Secondary | ICD-10-CM | POA: Diagnosis not present

## 2019-04-17 MED ORDER — FLUCONAZOLE 150 MG PO TABS: 150.0000 mg | ORAL_TABLET | Freq: Once | ORAL | 0 refills | Status: AC

## 2019-04-17 NOTE — Telephone Encounter (Signed)
done

## 2019-04-17 NOTE — Progress Notes (Signed)
Therapist, nutritional Palliative Care Consult Note Telephone: [phone]  Fax: [phone]  PATIENT NAME: Tammy Fernandez DOB: 10-Dec-1923 MRN: [id number]  PRIMARY CARE PROVIDER:   Myles Lipps, MD  REFERRING PROVIDER:  Myles Lipps, MD [address] [place], [place] [address]  RESPONSIBLE PARTY:   Jacki Lebourgeois, son [phone]  Due to the COVID-19 crisis, this visit was done via telemedicine and it was initiated and consent by this patient and or family. Video-audio (telehealth) contact was unable to be done due to technical barriers from the patients side.       RECOMMENDATIONS and PLAN:  1.  Blood pressure. Patient has had a couple times where BP increased but states that it happened when she was eating something salty.  She states that her BP goes up and her feet swell whenever she eats something salty.  Have encouraged her to not eat salty foods and not to add salt at the table.  She expresses desire to go to farmer's market and get fresh foods that don't have all the added salt.  2. Dyspnea/OSA. Denies any increased SOB or DOE. has been using her CPAP machine for at 4 hours every night.  States that her O2 sats have been staying 95% and above.  Encouraged to continue using CPAP like she has as this seems to be working for her.  3. GERD. Her prilosec was increased to 20 mg BID but son states that she still uses gasX and mylanta.  She is still having GERD symptoms.  Encouraged to use the prilosec BID to get better GERD management.   4. Yeast infection.  Patient is having vaginal itching and burning.  States that she has had a yeast infection for 3 weeks.  Has tried some cream OTC without relief. Have contacted Dr. Adela Glimpse office to get some diflucan ordered.  Patient and son also concerned about getting labs drawn to see her potassium level and blood O2 levels.  Do state that they have noticed more forgetfulness but no increased weakness.  Did  talk with Danford Bad at Dr. Adela Glimpse office to let them know they do want labs drawn on her next visit on 5/28   I spent 45 minutes providing this consultation,  from 2:30 to 3:15. More than 50% of the time in this consultation was spent coordinating communication.   HISTORY OF PRESENT ILLNESS:  Tammy Fernandez is a 85 y.o. year old female with multiple medical problems including pulmonary hypertension, COPD, OSA, SIADH. Palliative Care was asked to help address goals of care.   CODE STATUS: full code  PPS: 50% HOSPICE ELIGIBILITY/DIAGNOSIS: TBD  PAST MEDICAL HISTORY:  Past Medical History:  Diagnosis Date   Anxiety    Chronic lower back pain    Constipation    h/o fecal disimpaction on 2017   CVA (cerebral vascular accident) (HCC) 2015   neg workup, MRI small acute lacunar infarcts. ASA only   Depression    Diastolic CHF, chronic (HCC)    grade I, most recent echo Jan 2019   Fatty liver    GERD (gastroesophageal reflux disease)    Hyperlipidemia    Hypertension    Hyponatremia    multiple episodes with encephalopathy, renal thinks SIADH   Hypothyroidism, unspecified    Internal hemorrhoid    NSTEMI (non-ST elevated myocardial infarction) (HCC) 08/28/2016   Palpitations    PFO with atrial septal aneurysm    TTE 2015   Pneumonia 2016   Severe  pulmonary arterial systolic hypertension (HCC) 12/25/2017   per echo, on 1L oxgen via [place]    SOCIAL HX:  Social History   Tobacco Use   Smoking status: Never Smoker   Smokeless tobacco: Former NeurosurgeonUser    Types: Snuff   Tobacco comment: "used snuff when I was 19"  Substance Use Topics   Alcohol use: No    ALLERGIES:  Allergies  Allergen Reactions   Crestor [Rosuvastatin Calcium] Other (See Comments)    Muscle Aches   Keflex [Cephalexin] Other (See Comments)    dizziness   Peanut Butter Flavor Hives   Sulfa Antibiotics Hives   Cephalexin Other (See Comments)    dizziness   Sulfa Drugs Cross  Reactors Rash     PERTINENT MEDICATIONS:  Outpatient Encounter Medications as of 04/17/2019  Medication Sig   albuterol (PROVENTIL) (2.5 MG/3ML) 0.083% nebulizer solution Take 3 mLs (2.5 mg total) by nebulization every 4 (four) hours as needed for wheezing or shortness of breath.   ALPRAZolam (XANAX) 1 MG tablet Take 1 tablet (1 mg total) by mouth 2 (two) times daily as needed for anxiety.   amLODipine (NORVASC) 5 MG tablet Take 1 tablet (5 mg total) by mouth daily.   Aspirin 81 MG EC tablet Take 81 mg by mouth daily.   cetirizine (ZYRTEC) 10 MG tablet Take 10 mg by mouth daily.   demeclocycline (DECLOMYCIN) 300 MG tablet Take 150 mg by mouth 2 (two) times a day.   docusate sodium (COLACE) 100 MG capsule Take 100 mg by mouth daily as needed for mild constipation.   fluticasone (FLONASE) 50 MCG/ACT nasal spray Place 1 spray into both nostrils 2 (two) times daily.   glycerin adult 2 g suppository Place 1 suppository rectally as needed for constipation.   levothyroxine (SYNTHROID, LEVOTHROID) 50 MCG tablet Take 1 tablet (50 mcg total) by mouth daily before breakfast.   meclizine (ANTIVERT) 12.5 MG tablet TAKE 1 TABLET BY MOUTH AS NEEDED FOR DIZZINESS.   nystatin cream (MYCOSTATIN) Apply 1 application topically 2 (two) times daily.   olopatadine (PATANOL) 0.1 % ophthalmic solution Place 1 drop into both eyes daily as needed for allergies.   omeprazole (PRILOSEC) 20 MG capsule Take 1 capsule (20 mg total) by mouth 2 (two) times daily before a meal.   phenylephrine (SUDAFED PE) 10 MG TABS tablet Take 1 tablet (10 mg total) by mouth every 4 (four) hours as needed.   propranolol ER (INDERAL LA) 60 MG 24 hr capsule Take 1 capsule (60 mg total) by mouth daily.   No facility-administered encounter medications on file as of 04/17/2019.      Tacie Mccuistion Marlena ClipperK Kyaira Trantham, NP

## 2019-04-17 NOTE — Telephone Encounter (Signed)
Please see note below. 

## 2019-04-17 NOTE — Telephone Encounter (Signed)
Copied from CRM [id number]. Topic: Quick Communication - Home Health Verbal Orders >> Apr 17, 2019  2:57 PM Maia Petties wrote: Caller/Agency: Angelique Holm NP with Palliative Care Callback Number: [phone] Request: pt having bad yeast infection 2-3 weeks and hoping Dr. Leretha Pol can prescribe Diflucan for the pt, pt said she tried something OTC (a cream) Pt has also noted she is worried about lab work and getting sodium level rechecked.  Pharmacy: [id number] - [place], [place] - [address] AT [facility] [address] [facility] [address] [phone] (Phone) [phone] (Fax)

## 2019-04-21 ENCOUNTER — Other Ambulatory Visit: Payer: Self-pay | Admitting: Family Medicine

## 2019-04-21 DIAGNOSIS — R42 Dizziness and giddiness: Secondary | ICD-10-CM

## 2019-04-24 ENCOUNTER — Telehealth: Payer: Self-pay | Admitting: Family Medicine

## 2019-04-24 ENCOUNTER — Telehealth: Payer: Self-pay | Admitting: Adult Health Nurse Practitioner

## 2019-04-24 NOTE — Telephone Encounter (Signed)
Copied from CRM [id number]. Topic: General - Inquiry >> Apr 24, 2019 10:17 AM [name], NT wrote: Reason for CRM: Tammy Fernandez with Palliative Care calling on behalf of the patient and her son. States that the patient is very anxious about getting blood work. States that it has been 2 months since her last bloodwork and would like to know if her virtual visit for 04/30/2019 could be changed to an in office visit for her to get blood work? Please call patient and advise.

## 2019-04-24 NOTE — Telephone Encounter (Signed)
Returned call from patient's son.  Patient concerned about blood work to check sodium and O2 levels.   Son does state that she has had some increased confusion.  States that it has been 2-3 months seen blood work has been done.  Spoke with Dr. Buford Dresser office and patient is scheduled for virtual visit and would have to get Dr. Adela Glimpse approval for a office visit.  Expressed patient's concern and anxiety about having this done and they said their office would could the patient to confirm if visit was changed to office.  Called son back again and updated him on waiting for approval from Dr. Leretha Pol and getting a call from her office Shaniqwa Horsman K. Garner Nash NP

## 2019-04-24 NOTE — Telephone Encounter (Signed)
Please advise  Patient is requesting a refill of the following medications: Requested Prescriptions   Pending Prescriptions Disp Refills  . meclizine (ANTIVERT) 12.5 MG tablet [Pharmacy Med Name: MECLIZINE 12.5MG  (RX) TABLETS] 30 tablet 0    Sig: TAKE 1 TABLET BY MOUTH AS NEEDED FOR DIZZINESS

## 2019-04-28 ENCOUNTER — Telehealth: Payer: Self-pay | Admitting: Adult Health Nurse Practitioner

## 2019-04-28 NOTE — Telephone Encounter (Signed)
Talked with son to confirm appointment for tomorrow at 2pm.  Covid screening done.  Son says that she did have some coughing this morning but further questioning indicated that this may be due to her allergies.  Denies SOB, fever, or any known COVID contacts. Markelle Najarian K. Garner Nash NP

## 2019-04-29 ENCOUNTER — Other Ambulatory Visit: Payer: Medicare Other | Admitting: Adult Health Nurse Practitioner

## 2019-04-29 DIAGNOSIS — Z515 Encounter for palliative care: Secondary | ICD-10-CM | POA: Diagnosis not present

## 2019-04-29 NOTE — Progress Notes (Signed)
Therapist, nutritional Palliative Care Consult Note Telephone: [phone]  Fax: [phone]  PATIENT NAME: Tammy Fernandez DOB: Jul 21, 1924 MRN: [id number]  PRIMARY CARE PROVIDER:   Myles Lipps, MD  REFERRING PROVIDER:  Myles Lipps, MD [address] [place], [place] [address]  RESPONSIBLE PARTY:   Tammy Fernandez [phone]     RECOMMENDATIONS and PLAN:  1.  Blood pressure.Patient's reported BP yesterday was 142/64.  Fernandez states that her BP has been running normal.  Does have trace edema to bilateral feet.  Continue to encourage decreased salt intake as she states that her feet swell when she eats salt.  Does state that her heart rate has been as low as 53.  Fernandez states that it seems to go down when she puts on her CPAP machine.  Educated patient and Fernandez that it is normal for HR to be low when at rest and she is on propranolol that will lower her HR as well.  Fernandez states that when she does her exercises or moves from one room to next that her HR will go up into the 60s and 70s.  If these are new lower normal heart rates for her could lower her dose of propranolol but patient states she does not want to mess with her propranolol as she has been taking this same dose for many years.  2. Dyspnea/OSA. Denies any increased SOB or DOE. has been using her CPAP machine for  4-5 hours every night.  States that her O2 sats have been staying 92% and above.  Encouraged to continue using CPAP like she has as this seems to be working for her.  3. GERD.Patient and son do state that she has been taking Prilosec 20 mg BID for about a week now and it is giving her relief.  Encouraged her to continue taking the prilosec BID.  4.Pain to left shoulder.  Patient states that she has been having more pain in her left shoulder.  States that she sprained her right shoulder a couple years ago and it still hurts at times, especially when she lays on it, so she has been using and  lying on her left shoulder more.  Have demonstrated a couple of exercises for her shoulders that she can try at home and encouraged her to mention it to her PCP in case OT is warranted.    5.  Concerns.  Patient does have an in office visit with her PCP tomorrow.  Have wrote down patient and Fernandez's concerns so they have a list to go by in the office visit tomorrow.  Concerns are blood work to check sodium and CO2 levels, a repeat chest xray that was ordered upon discharge from hospital that did not get done due to COVID.  Also wanted to discuss low HR, pain in left shoulder, and encouraged them to make sure PCP is aware of herbal supplements she is taking.   She reports taking Hawthorn and red yeast rice.  I spent 60 minutes providing this consultation,  from 2:00 to 3:00. More than 50% of the time in this consultation was spent coordinating communication.   HISTORY OF PRESENT ILLNESS:  Tammy Fernandez is a 85 y.o. year old female with multiple medical problems including pulmonary hypertension, COPD, OSA, SIADH. Palliative Care was asked to help address goals of care.   CODE STATUS: Full Code  PPS: 50% HOSPICE ELIGIBILITY/DIAGNOSIS: TBD  PHYSICAL EXAM:   General: patient sitting on couch at  home in NAD Cardiovascular: regular rate and rhythm.  HR 62 Pulmonary: lung sounds diminished but no abnormal breath sounds heard, normal respiratory effort.  O2 sat 99% on 2L  Abdomen: soft, nontender, + bowel sounds GU: no suprapubic tenderness Extremities: trace edema to bilateral feet, no joint deformities Skin: no rashes Neurological: Weakness but otherwise nonfocal  PAST MEDICAL HISTORY:  Past Medical History:  Diagnosis Date  . Anxiety   . Chronic lower back pain   . Constipation    h/o fecal disimpaction on 2017  . CVA (cerebral vascular accident) (HCC) 2015   neg workup, MRI small acute lacunar infarcts. ASA only  . Depression   . Diastolic CHF, chronic (HCC)    grade I, most recent echo  Jan 2019  . Fatty liver   . GERD (gastroesophageal reflux disease)   . Hyperlipidemia   . Hypertension   . Hyponatremia    multiple episodes with encephalopathy, renal thinks SIADH  . Hypothyroidism, unspecified   . Internal hemorrhoid   . NSTEMI (non-ST elevated myocardial infarction) (HCC) 08/28/2016  . Palpitations   . PFO with atrial septal aneurysm    TTE 2015  . Pneumonia 2016  . Severe pulmonary arterial systolic hypertension (HCC) 12/25/2017   per echo, on 1L oxgen via [place]    SOCIAL HX:  Social History   Tobacco Use  . Smoking status: Never Smoker  . Smokeless tobacco: Former Neurosurgeon    Types: Snuff  . Tobacco comment: "used snuff when I was 19"  Substance Use Topics  . Alcohol use: No    ALLERGIES:  Allergies  Allergen Reactions  . Crestor [Rosuvastatin Calcium] Other (See Comments)    Muscle Aches  . Keflex [Cephalexin] Other (See Comments)    dizziness  . Peanut Butter Flavor Hives  . Sulfa Antibiotics Hives  . Cephalexin Other (See Comments)    dizziness  . Sulfa Drugs Cross Reactors Rash     PERTINENT MEDICATIONS:  Outpatient Encounter Medications as of 04/29/2019  Medication Sig  . albuterol (PROVENTIL) (2.5 MG/3ML) 0.083% nebulizer solution Take 3 mLs (2.5 mg total) by nebulization every 4 (four) hours as needed for wheezing or shortness of breath.  . ALPRAZolam (XANAX) 1 MG tablet Take 1 tablet (1 mg total) by mouth 2 (two) times daily as needed for anxiety.  Marland Kitchen amLODipine (NORVASC) 5 MG tablet Take 1 tablet (5 mg total) by mouth daily.  . Aspirin 81 MG EC tablet Take 81 mg by mouth daily.  . cetirizine (ZYRTEC) 10 MG tablet Take 10 mg by mouth daily.  Marland Kitchen demeclocycline (DECLOMYCIN) 300 MG tablet Take 150 mg by mouth 2 (two) times a day.  . docusate sodium (COLACE) 100 MG capsule Take 100 mg by mouth daily as needed for mild constipation.  . fluticasone (FLONASE) 50 MCG/ACT nasal spray Place 1 spray into both nostrils 2 (two) times daily.  Marland Kitchen glycerin  adult 2 g suppository Place 1 suppository rectally as needed for constipation.  Marland Kitchen levothyroxine (SYNTHROID, LEVOTHROID) 50 MCG tablet Take 1 tablet (50 mcg total) by mouth daily before breakfast.  . meclizine (ANTIVERT) 12.5 MG tablet TAKE 1 TABLET BY MOUTH AS NEEDED FOR DIZZINESS  . nystatin cream (MYCOSTATIN) Apply 1 application topically 2 (two) times daily.  Marland Kitchen olopatadine (PATANOL) 0.1 % ophthalmic solution Place 1 drop into both eyes daily as needed for allergies.  Marland Kitchen omeprazole (PRILOSEC) 20 MG capsule Take 1 capsule (20 mg total) by mouth 2 (two) times daily before a meal.  .  phenylephrine (SUDAFED PE) 10 MG TABS tablet Take 1 tablet (10 mg total) by mouth every 4 (four) hours as needed.  . propranolol ER (INDERAL LA) 60 MG 24 hr capsule Take 1 capsule (60 mg total) by mouth daily.   No facility-administered encounter medications on file as of 04/29/2019.       Rachal Dvorsky Marlena ClipperK Mohamed Portlock, NP

## 2019-04-29 NOTE — Telephone Encounter (Signed)
Please call the son about this matter.

## 2019-04-30 ENCOUNTER — Encounter: Payer: Self-pay | Admitting: Family Medicine

## 2019-04-30 ENCOUNTER — Ambulatory Visit (INDEPENDENT_AMBULATORY_CARE_PROVIDER_SITE_OTHER): Payer: Medicare Other | Admitting: Family Medicine

## 2019-04-30 ENCOUNTER — Other Ambulatory Visit: Payer: Self-pay

## 2019-04-30 VITALS — BP 148/59 | HR 70 | Temp 98.4°F | Ht 62.0 in

## 2019-04-30 DIAGNOSIS — M25512 Pain in left shoulder: Secondary | ICD-10-CM

## 2019-04-30 DIAGNOSIS — E871 Hypo-osmolality and hyponatremia: Secondary | ICD-10-CM

## 2019-04-30 DIAGNOSIS — D649 Anemia, unspecified: Secondary | ICD-10-CM

## 2019-04-30 DIAGNOSIS — N898 Other specified noninflammatory disorders of vagina: Secondary | ICD-10-CM

## 2019-04-30 DIAGNOSIS — E222 Syndrome of inappropriate secretion of antidiuretic hormone: Secondary | ICD-10-CM

## 2019-04-30 DIAGNOSIS — J9611 Chronic respiratory failure with hypoxia: Secondary | ICD-10-CM | POA: Diagnosis not present

## 2019-04-30 DIAGNOSIS — G8929 Other chronic pain: Secondary | ICD-10-CM

## 2019-04-30 DIAGNOSIS — R350 Frequency of micturition: Secondary | ICD-10-CM | POA: Diagnosis not present

## 2019-04-30 DIAGNOSIS — I10 Essential (primary) hypertension: Secondary | ICD-10-CM

## 2019-04-30 MED ORDER — AMLODIPINE BESYLATE 10 MG PO TABS: 10.0000 mg | ORAL_TABLET | Freq: Every day | ORAL | 1 refills | Status: DC

## 2019-04-30 MED ORDER — DICLOFENAC SODIUM 1 % TD GEL: 2.0000 g | Freq: Four times a day (QID) | TRANSDERMAL | 5 refills | Status: DC

## 2019-04-30 NOTE — Patient Instructions (Signed)
° ° ° °  If you have lab work done today you will be contacted with your lab results within the next 2 weeks.  If you have not heard from us then please contact us. The fastest way to get your results is to register for My Chart. ° ° °IF you received an x-ray today, you will receive an invoice from [facility]. Please contact [facility] at [phone] with questions or concerns regarding your invoice.  ° °IF you received labwork today, you will receive an invoice from [facility]. Please contact [facility] at [phone] with questions or concerns regarding your invoice.  ° °Our billing staff will not be able to assist you with questions regarding bills from these companies. ° °You will be contacted with the lab results as soon as they are available. The fastest way to get your results is to activate your My Chart account. Instructions are located on the last page of this paperwork. If you have not heard from us regarding the results in 2 weeks, please contact this office. °  ° ° ° °

## 2019-04-30 NOTE — Progress Notes (Signed)
5/28/20204:06 PM  Tammy Fernandez 02/18/1924, 85 y.o., female [id number]  Chief Complaint  Patient presents with  . chronic Conditions    1 m flu and some concerns     HPI:   Patient is a89 y.o.femalewith past medical history significant for hyponatremia2/2 SIADH, constipation, HTN,dCHF, pHTN on oxygen, CVA, GERD and seasonal allergieswho presents today for routine followup  Last OV (telemedicine) April 2020 Palliative care involved, last visit yesterday  Started taking omeprazole BID about a week ago Having bowel movements about 3-4 days Having abd pain and bloating Doing only colace Stopped taking miralax and probiotic  Has noticed occasional HR in the 50s at night when she is sleeping using her cpap, otherwise HR 60-70s Using cpap for 4-5 hours every night  Having increased urination but no dysuria Reports she had a painful itchy bump on left labia Has been using monostat 7 day treatment  She is also having flare up of her left shoulder bursitis  Also having increased BP for past several days SBP > 170 Denies any chest pain, palpitations, edema, SOB Using her oxygen 2.5L continuous   Lab Results  Component Value Date   TSH 0.647 12/28/2018   Lab Results  Component Value Date   WBC 10.5 01/07/2019   HGB 9.0 (L) 01/07/2019   HCT 30.3 (L) 01/07/2019   MCV 97.1 01/07/2019   PLT 258 01/07/2019    Fall Risk  04/30/2019 03/30/2019 02/13/2019 12/20/2018 12/04/2018  Falls in the past year? 1 0 1 0 1  Number falls in past yr: 0 0 1 - 1  Injury with Fall? 0 0 0 - 0  Risk for fall due to : - - Other (Comment) - -  Risk for fall due to: Comment - - wheel chair - -  Follow up Falls evaluation completed - Falls evaluation completed - -     Depression screen [facility] 2/9 04/30/2019 03/30/2019 12/20/2018  Decreased Interest 0 0 0  Down, Depressed, Hopeless 3 0 0  PHQ - 2 Score 3 0 0  Altered sleeping 0 - -  Tired, decreased energy 3 - -  Change in appetite 0 - -   Feeling bad or failure about yourself  0 - -  Trouble concentrating 0 - -  Moving slowly or fidgety/restless 0 - -  Suicidal thoughts 0 - -  PHQ-9 Score 6 - -  Difficult doing work/chores Not difficult at all - -  Some recent data might be hidden    Allergies  Allergen Reactions  . Crestor [Rosuvastatin Calcium] Other (See Comments)    Muscle Aches  . Keflex [Cephalexin] Other (See Comments)    dizziness  . Peanut Butter Flavor Hives  . Sulfa Antibiotics Hives  . Cephalexin Other (See Comments)    dizziness  . Sulfa Drugs Cross Reactors Rash    Prior to Admission medications   Medication Sig Start Date End Date Taking? Authorizing Provider  albuterol (PROVENTIL) (2.5 MG/3ML) 0.083% nebulizer solution Take 3 mLs (2.5 mg total) by nebulization every 4 (four) hours as needed for wheezing or shortness of breath. 10/08/18  Yes Icard, Rachel Bo, DO  ALPRAZolam (XANAX) 1 MG tablet Take 1 tablet (1 mg total) by mouth 2 (two) times daily as needed for anxiety. 02/20/19  Yes Stallings, Zoe A, MD  amLODipine (NORVASC) 5 MG tablet Take 1 tablet (5 mg total) by mouth daily. 03/18/19  Yes Myles Lipps, MD  Aspirin 81 MG EC tablet Take 81 mg by  mouth daily.   Yes [provider]  cetirizine (ZYRTEC) 10 MG tablet Take 10 mg by mouth daily.   Yes [provider]  demeclocycline (DECLOMYCIN) 300 MG tablet Take 150 mg by mouth 2 (two) times a day. 03/11/19  Yes [provider]  docusate sodium (COLACE) 100 MG capsule Take 100 mg by mouth daily as needed for mild constipation.   Yes [provider]  fluticasone (FLONASE) 50 MCG/ACT nasal spray Place 1 spray into both nostrils 2 (two) times daily. 02/16/19  Yes Myles Lipps, MD  glycerin adult 2 g suppository Place 1 suppository rectally as needed for constipation. 01/27/19  Yes Myles Lipps, MD  levothyroxine (SYNTHROID, LEVOTHROID) 50 MCG tablet Take 1 tablet (50 mcg total) by mouth daily before breakfast.  09/16/18  Yes Myles Lipps, MD  meclizine (ANTIVERT) 12.5 MG tablet TAKE 1 TABLET BY MOUTH AS NEEDED FOR DIZZINESS 04/27/19  Yes Myles Lipps, MD  nystatin cream (MYCOSTATIN) Apply 1 application topically 2 (two) times daily. 03/03/19  Yes Myles Lipps, MD  olopatadine (PATANOL) 0.1 % ophthalmic solution Place 1 drop into both eyes daily as needed for allergies. 02/16/19  Yes Myles Lipps, MD  omeprazole (PRILOSEC) 20 MG capsule Take 1 capsule (20 mg total) by mouth 2 (two) times daily before a meal. 04/03/19  Yes Myles Lipps, MD  propranolol ER (INDERAL LA) 60 MG 24 hr capsule Take 1 capsule (60 mg total) by mouth daily. 02/17/19  Yes Myles Lipps, MD  phenylephrine (SUDAFED PE) 10 MG TABS tablet Take 1 tablet (10 mg total) by mouth every 4 (four) hours as needed. Patient not taking: Reported on 04/30/2019 04/03/19   Myles Lipps, MD    Past Medical History:  Diagnosis Date  . Anxiety   . Chronic lower back pain   . Constipation    h/o fecal disimpaction on 2017  . CVA (cerebral vascular accident) (HCC) 2015   neg workup, MRI small acute lacunar infarcts. ASA only  . Depression   . Diastolic CHF, chronic (HCC)    grade I, most recent echo Jan 2019  . Fatty liver   . GERD (gastroesophageal reflux disease)   . Hyperlipidemia   . Hypertension   . Hyponatremia    multiple episodes with encephalopathy, renal thinks SIADH  . Hypothyroidism, unspecified   . Internal hemorrhoid   . NSTEMI (non-ST elevated myocardial infarction) (HCC) 08/28/2016  . Palpitations   . PFO with atrial septal aneurysm    TTE 2015  . Pneumonia 2016  . Severe pulmonary arterial systolic hypertension (HCC) 12/25/2017   per echo, on 1L oxgen via [place]    Past Surgical History:  Procedure Laterality Date  . ABDOMINAL HYSTERECTOMY    . CATARACT EXTRACTION W/ INTRAOCULAR LENS  IMPLANT, BILATERAL Bilateral   . CESAREAN SECTION    . TEE WITHOUT CARDIOVERSION N/A 01/19/2014   Procedure:  TRANSESOPHAGEAL ECHOCARDIOGRAM (TEE);  Surgeon: Laurey Morale, MD;  Location: [facility] END[facility]COPY;  Service: Cardiovascular;  Laterality: N/A;  [name]  . VAGINAL HYSTERECTOMY      Social History   Tobacco Use  . Smoking status: Never Smoker  . Smokeless tobacco: Former Neurosurgeon    Types: Snuff  . Tobacco comment: "used snuff when I was 19"  Substance Use Topics  . Alcohol use: No    Family History  Problem Relation Age of Onset  . Stroke Father   . Hypertension Father   . Colon  cancer Cousin   . Colon cancer Other        Aunt  . Diabetes Other        aunt and cousins  . Heart disease Other        cousins  . Healthy Daughter   . Healthy Son     ROS Per hpi  OBJECTIVE:  Today's Vitals   04/30/19 1548  BP: (!) 148/59  Pulse: 70  Temp: 98.4 F (36.9 C)  TempSrc: Oral  SpO2: 99%  Height: 5\' 2"  (1.575 m)   Body mass index is 29.45 kg/m.  BP Readings from Last 3 Encounters:  04/30/19 (!) 148/59  03/25/19 (!) 142/64  03/24/19 130/60    Physical Exam Vitals signs and nursing note reviewed. Exam conducted with a chaperone present.  Constitutional:      Appearance: She is well-developed.  HENT:     Head: Normocephalic and atraumatic.     Mouth/Throat:     Pharynx: No oropharyngeal exudate.  Eyes:     General: No scleral icterus.    Conjunctiva/sclera: Conjunctivae normal.     Pupils: Pupils are equal, round, and reactive to light.  Neck:     Musculoskeletal: Neck supple.  Cardiovascular:     Rate and Rhythm: Normal rate and regular rhythm.     Heart sounds: Normal heart sounds. No murmur. No friction rub. No gallop.   Pulmonary:     Effort: Pulmonary effort is normal.     Breath sounds: Normal breath sounds. No wheezing or rales.  Abdominal:     General: Bowel sounds are normal. There is no distension.     Palpations: Abdomen is soft.     Tenderness: There is no abdominal tenderness.  Genitourinary:    General: Normal vulva.     Vagina: No vaginal  discharge.     Comments: No lesions seen Musculoskeletal:     Right lower leg: No edema.     Left lower leg: No edema.  Skin:    General: Skin is warm and dry.  Neurological:     Mental Status: She is alert and oriented to person, place, and time.     Gait: Gait abnormal.  Psychiatric:        Mood and Affect: Mood normal.      ASSESSMENT and PLAN  1. Hyponatremia - Comprehensive metabolic panel; Future 2. SIADH (syndrome of inappropriate ADH production) (HCC) 3. Chronic respiratory failure with hypoxia [facility](HCC) Patient will return for lab draws as unable to do today. Continue with fluid restriction, O2 and cpap use.  4. Essential hypertension Uncontrolled. Increase amlodipine to 10mg  once a day. Cont home BP monitoring.  5. Anemia, unspecified type - CBC with Differential/Platelet; Future - Iron, TIBC and Ferritin Panel; Future - Vitamin B12; Future - Folate; Future  6. Urinary frequency - Urinalysis, Routine w reflex microscopic  7. Vaginal lesion Resolved with OCT monostat. Reassured patient.  8. Chronic left shoulder pain Diclofenac gel. Consider PT.  Other orders - diclofenac sodium (VOLTAREN) 1 % GEL; Apply 2 g topically 4 (four) times daily. - amLODipine (NORVASC) 10 MG tablet; Take 1 tablet (10 mg total) by mouth daily.  Return in about 3 months (around 07/31/2019).    Myles LippsIrma M Santiago, MD [facility] [address] [place], [place] [address] Ph.  [phone] Fax [phone]

## 2019-05-01 LAB — URINALYSIS, ROUTINE W REFLEX MICROSCOPIC
Bilirubin, UA: NEGATIVE
Glucose, UA: NEGATIVE
Ketones, UA: NEGATIVE
Nitrite, UA: NEGATIVE
RBC, UA: NEGATIVE
Specific Gravity, UA: 1.008 (ref 1.005–1.030)
Urobilinogen, Ur: 0.2 mg/dL (ref 0.2–1.0)
pH, UA: >=9 — AB (ref 5.0–7.5)

## 2019-05-01 LAB — MICROSCOPIC EXAMINATION: Casts: NONE SEEN /lpf

## 2019-05-01 LAB — URINALYSIS, MICROSCOPIC ONLY: Casts: NONE SEEN /lpf

## 2019-05-01 NOTE — Telephone Encounter (Signed)
Pt came in yesterday for ov, lab did attempt to draw on more than 3 attempts.Pt was suppose to return today after drinking water

## 2019-05-02 DIAGNOSIS — I6789 Other cerebrovascular disease: Secondary | ICD-10-CM | POA: Diagnosis not present

## 2019-05-02 DIAGNOSIS — R7881 Bacteremia: Secondary | ICD-10-CM | POA: Diagnosis not present

## 2019-05-02 DIAGNOSIS — J9601 Acute respiratory failure with hypoxia: Secondary | ICD-10-CM | POA: Diagnosis not present

## 2019-05-02 DIAGNOSIS — B9562 Methicillin resistant Staphylococcus aureus infection as the cause of diseases classified elsewhere: Secondary | ICD-10-CM | POA: Diagnosis not present

## 2019-05-04 ENCOUNTER — Other Ambulatory Visit: Payer: Self-pay

## 2019-05-04 ENCOUNTER — Ambulatory Visit: Payer: Medicare Other

## 2019-05-04 DIAGNOSIS — E871 Hypo-osmolality and hyponatremia: Secondary | ICD-10-CM | POA: Diagnosis not present

## 2019-05-04 DIAGNOSIS — D649 Anemia, unspecified: Secondary | ICD-10-CM | POA: Diagnosis not present

## 2019-05-05 LAB — IRON,TIBC AND FERRITIN PANEL
Ferritin: 62 ng/mL (ref 15–150)
Iron Saturation: 12 % — ABNORMAL LOW (ref 15–55)
Iron: 38 ug/dL (ref 27–139)
Total Iron Binding Capacity: 314 ug/dL (ref 250–450)
UIBC: 276 ug/dL (ref 118–369)

## 2019-05-05 LAB — COMPREHENSIVE METABOLIC PANEL
ALT: 7 IU/L (ref 0–32)
AST: 22 IU/L (ref 0–40)
Albumin/Globulin Ratio: 1.1 — ABNORMAL LOW (ref 1.2–2.2)
Albumin: 3.9 g/dL (ref 3.5–4.6)
Alkaline Phosphatase: 100 IU/L (ref 39–117)
BUN/Creatinine Ratio: 20 (ref 12–28)
BUN: 14 mg/dL (ref 10–36)
Bilirubin Total: <0.2 mg/dL (ref 0.0–1.2)
CO2: 31 mmol/L — ABNORMAL HIGH (ref 20–29)
Calcium: 9.6 mg/dL (ref 8.7–10.3)
Chloride: 84 mmol/L — ABNORMAL LOW (ref 96–106)
Creatinine, Ser: 0.7 mg/dL (ref 0.57–1.00)
GFR calc Af Amer: 87 mL/min/{1.73_m2} (ref >59)
GFR calc non Af Amer: 75 mL/min/{1.73_m2} (ref >59)
Globulin, Total: 3.4 g/dL (ref 1.5–4.5)
Glucose: 141 mg/dL — ABNORMAL HIGH (ref 65–99)
Potassium: 4.7 mmol/L (ref 3.5–5.2)
Sodium: 132 mmol/L — ABNORMAL LOW (ref 134–144)
Total Protein: 7.3 g/dL (ref 6.0–8.5)

## 2019-05-05 LAB — CBC WITH DIFFERENTIAL/PLATELET
Basophils Absolute: 0 10*3/uL (ref 0.0–0.2)
Basos: 0 %
EOS (ABSOLUTE): 0.2 10*3/uL (ref 0.0–0.4)
Eos: 2 %
Hematocrit: 31.4 % — ABNORMAL LOW (ref 34.0–46.6)
Hemoglobin: 9.9 g/dL — ABNORMAL LOW (ref 11.1–15.9)
Immature Grans (Abs): 0 10*3/uL (ref 0.0–0.1)
Immature Granulocytes: 0 %
Lymphocytes Absolute: 3.4 10*3/uL — ABNORMAL HIGH (ref 0.7–3.1)
Lymphs: 37 %
MCH: 28 pg (ref 26.6–33.0)
MCHC: 31.5 g/dL (ref 31.5–35.7)
MCV: 89 fL (ref 79–97)
Monocytes Absolute: 0.7 10*3/uL (ref 0.1–0.9)
Monocytes: 7 %
Neutrophils Absolute: 4.8 10*3/uL (ref 1.4–7.0)
Neutrophils: 54 %
Platelets: 262 10*3/uL (ref 150–450)
RBC: 3.54 x10E6/uL — ABNORMAL LOW (ref 3.77–5.28)
RDW: 12.4 % (ref 11.7–15.4)
WBC: 9.1 10*3/uL (ref 3.4–10.8)

## 2019-05-05 LAB — VITAMIN B12: Vitamin B-12: 639 pg/mL (ref 232–1245)

## 2019-05-05 LAB — FOLATE: Folate: 14.2 ng/mL (ref >3.0)

## 2019-05-06 ENCOUNTER — Telehealth: Payer: Self-pay | Admitting: Family Medicine

## 2019-05-06 NOTE — Telephone Encounter (Signed)
Copied from CRM [id number]. Topic: Quick Communication - See Telephone Encounter >> May 06, 2019 12:00 PM Angela Nevin wrote: CRM for notification. See Telephone encounter for: 05/06/19.  Patient calling to check status of lab results.

## 2019-05-06 NOTE — Telephone Encounter (Signed)
Tammy Fernandez is wanting the results to mom's labs

## 2019-05-06 NOTE — Telephone Encounter (Signed)
Please call pt son

## 2019-05-06 NOTE — Telephone Encounter (Signed)
Please let them know. Overall stable except for sodium of 132. CO2 imporved at 31. Please make sure fluid restrictions and salt tabs are taken as prescribed.  Continue using cpap. thanks

## 2019-05-07 ENCOUNTER — Other Ambulatory Visit: Payer: Self-pay

## 2019-05-07 ENCOUNTER — Other Ambulatory Visit: Payer: Medicare Other | Admitting: Adult Health Nurse Practitioner

## 2019-05-07 DIAGNOSIS — Z515 Encounter for palliative care: Secondary | ICD-10-CM | POA: Diagnosis not present

## 2019-05-07 NOTE — Progress Notes (Signed)
[facility] Consult Note Telephone: [phone]  Fax: [phone]  PATIENT NAME: Tammy BeachRosa L Gagliardo DOB: 30-Jan-1924 MRN: [id number]  PRIMARY CARE PROVIDER:   Myles LippsSantiago, Irma M, MD  REFERRING PROVIDER:  Myles LippsSantiago, Irma M, MD [address] [place], [place] [address]  RESPONSIBLE PARTY:   Mare FerrariJerome Blitzer, son [phone]   RECOMMENDATIONS and PLAN:  Son had called earlier this week concerned about patient's cough and that it might be pneumonia.  Patient does have a history of allergies.  It is a productive cough mainly early in the morning. Patient denies fever, body aches, increased SOB, chest pain.  Patient does state that her nebulizer treatments worsen the cough but help the phlegm come up better.  No cough noted during visit, breath sounds diminished but clear (this is patient's baseline).  [facility] had WBC WNL and was otherwise unremarkable.  Feel like patient's allergies are causing the congestion and builds up while lying down in bed, increasing her cough in the morning.  Have encouraged patient to take nebulizer treatments every 6 hours for 4 days to help loosen the congestion to make it easier to cough up and to take tussin cough syrup PRN for her cough.  Goal is to prevent congestion from building up in her lungs which could lead to pneumonia.  Will follow up with patient in one week.    Son also concerned that patient had one morning in which she was hard to wake and her BP was low at 110/48. Of note it appears that patient may be taking too much meclizine.  Son had pointed out that the meclizine bottle was lower than it had been a few days prior.  Believe this may have been the problem with patient being hard to wake with low BP as these are side effects of meclizine.  Have instructed patient to only take the meclizine when absolutely needed and went over the side effects and potential hazards, such as increased fall  risk, with this medication.  Patient and son expressed understanding.   I spent 45 minutes providing this consultation,  from 12:30 to 1:15. More than 50% of the time in this consultation was spent coordinating communication.   HISTORY OF PRESENT ILLNESS:  Tammy Fernandez is a 85 y.o. year old female with multiple medical problems including pulmonary hypertension, COPD, OSA, SIADH. Palliative Care was asked to help address goals of care.   CODE STATUS: Full Code  PPS:50% HOSPICE ELIGIBILITY/DIAGNOSIS: TBD  PHYSICAL EXAM:   General: patient sitting on edge of bed at home in NAD Cardiovascular: regular rate and rhythm.  HR 64 Pulmonary: lung sounds diminished but no abnormal breath sounds heard, normal respiratory effort.  O2 sat 96% on 2L  Abdomen: soft, nontender, + bowel sounds GU: no suprapubic tenderness Extremities: trace edema to left foot but no edema noted to right foot, no joint deformities Skin: no rashes Neurological: Weakness but otherwise nonfocal  PAST MEDICAL HISTORY:  Past Medical History:  Diagnosis Date  . Anxiety   . Chronic lower back pain   . Constipation    h/o fecal disimpaction on 2017  . CVA (cerebral vascular accident) (HCC) 2015   neg workup, MRI small acute lacunar infarcts. ASA only  . Depression   . Diastolic CHF, chronic (HCC)    grade I, most recent echo Jan 2019  . Fatty liver   . GERD (gastroesophageal reflux disease)   . Hyperlipidemia   .  Hypertension   . Hyponatremia    multiple episodes with encephalopathy, renal thinks SIADH  . Hypothyroidism, unspecified   . Internal hemorrhoid   . NSTEMI (non-ST elevated myocardial infarction) (HCC) 08/28/2016  . Palpitations   . PFO with atrial septal aneurysm    TTE 2015  . Pneumonia 2016  . Severe pulmonary arterial systolic hypertension (HCC) 12/25/2017   per echo, on 1L oxgen via [place]    SOCIAL HX:  Social History   Tobacco Use  . Smoking status: Never Smoker  . Smokeless tobacco:  Former Neurosurgeon    Types: Snuff  . Tobacco comment: "used snuff when I was 19"  Substance Use Topics  . Alcohol use: No    ALLERGIES:  Allergies  Allergen Reactions  . Crestor [Rosuvastatin Calcium] Other (See Comments)    Muscle Aches  . Keflex [Cephalexin] Other (See Comments)    dizziness  . Peanut Butter Flavor Hives  . Sulfa Antibiotics Hives  . Cephalexin Other (See Comments)    dizziness  . Sulfa Drugs Cross Reactors Rash     PERTINENT MEDICATIONS:  Outpatient Encounter Medications as of 05/07/2019  Medication Sig  . albuterol (PROVENTIL) (2.5 MG/3ML) 0.083% nebulizer solution Take 3 mLs (2.5 mg total) by nebulization every 4 (four) hours as needed for wheezing or shortness of breath.  . ALPRAZolam (XANAX) 1 MG tablet Take 1 tablet (1 mg total) by mouth 2 (two) times daily as needed for anxiety.  Marland Kitchen amLODipine (NORVASC) 10 MG tablet Take 1 tablet (10 mg total) by mouth daily.  . Aspirin 81 MG EC tablet Take 81 mg by mouth daily.  . cetirizine (ZYRTEC) 10 MG tablet Take 10 mg by mouth daily.  Marland Kitchen demeclocycline (DECLOMYCIN) 300 MG tablet Take 150 mg by mouth 2 (two) times a day.  . diclofenac sodium (VOLTAREN) 1 % GEL Apply 2 g topically 4 (four) times daily.  Marland Kitchen docusate sodium (COLACE) 100 MG capsule Take 100 mg by mouth daily as needed for mild constipation.  . fluticasone (FLONASE) 50 MCG/ACT nasal spray Place 1 spray into both nostrils 2 (two) times daily.  Marland Kitchen glycerin adult 2 g suppository Place 1 suppository rectally as needed for constipation.  Marland Kitchen levothyroxine (SYNTHROID, LEVOTHROID) 50 MCG tablet Take 1 tablet (50 mcg total) by mouth daily before breakfast.  . meclizine (ANTIVERT) 12.5 MG tablet TAKE 1 TABLET BY MOUTH AS NEEDED FOR DIZZINESS  . nystatin cream (MYCOSTATIN) Apply 1 application topically 2 (two) times daily.  Marland Kitchen olopatadine (PATANOL) 0.1 % ophthalmic solution Place 1 drop into both eyes daily as needed for allergies.  Marland Kitchen omeprazole (PRILOSEC) 20 MG capsule Take 1  capsule (20 mg total) by mouth 2 (two) times daily before a meal.  . propranolol ER (INDERAL LA) 60 MG 24 hr capsule Take 1 capsule (60 mg total) by mouth daily.   No facility-administered encounter medications on file as of 05/07/2019.       Lark Langenfeld Marlena Clipper, NP

## 2019-05-07 NOTE — Telephone Encounter (Signed)
Not necessarily. thanks

## 2019-05-07 NOTE — Telephone Encounter (Signed)
I have called pt son back and gave him the results and also gave pt the results via speaker as well. Pt son said that pt's BP has dropped a bit on the night before last on the Cpap.  It was 110/48 and [name] wanted to know if this is concerning

## 2019-05-07 NOTE — Telephone Encounter (Signed)
I have pt back and spoke to son. I have informed him of message and he stated understanding. I have told him to continue to monitor it as he has.

## 2019-05-13 ENCOUNTER — Other Ambulatory Visit: Payer: Self-pay

## 2019-05-13 ENCOUNTER — Other Ambulatory Visit: Payer: Medicare Other | Admitting: Adult Health Nurse Practitioner

## 2019-05-13 DIAGNOSIS — Z515 Encounter for palliative care: Secondary | ICD-10-CM

## 2019-05-13 NOTE — Progress Notes (Signed)
[facility] Consult Note Telephone: [phone]  Fax: [phone]  PATIENT NAME: Tammy Fernandez DOB: 09/27/1924 MRN: [id number]  PRIMARY CARE PROVIDER:   Rutherford Guys, MD  REFERRING PROVIDER:  Rutherford Guys, MD [address] [place], [place] [address]  RESPONSIBLE PARTY:   Tammy Fernandez, son [phone]    RECOMMENDATIONS and PLAN:  1.  Cough.  Patient doing better than last week.  She is still having the cough in the mornings though states it is improving.  Believe this is due to allergies and when she lies down all the congestion doesn't drain and she coughs to get out.  She has been taking her allergy medicine and mucinex.  Encouraged her to stay active to help it from developing into pneumonia.    2.  Dyspnea/OSA.  She has been using her CPAP machine 4-5 hours per night.  States her O2 sats stay in the 90s.  Son states that her O2 sats will drop in the 70s when she exerts herself and he will increase her O2 to 3L and it goes back up quickly. Then he turns the O2 back to 2.5L.  Patient concerned about this but reassured her that with her COPD her lungs aren't elastic and as long as her O2 sats go back up quickly, she is fine.  Did instruct that they need to call me or PCP if her sats go down and even with increased O2 they don't come back up.  3.  GERD.  Patient has been taking prilosec 20 mg BID.  Still has complaints of reflux symptoms when she lies down.  May need to increase night time dose to 40 mg for better control of reflux symptoms.  4. Hyponatremia.  Patient's most recent labs show a sodium stable at 132.  Son asking about demeclocycline.  Believed this was discontinued in the hospital.  Will reach out to PCP about usage of this medication.  I spent 45 minutes providing this consultation,  from 2:00 to 2:45. More than 50% of the time in this consultation was spent coordinating communication.   HISTORY OF PRESENT ILLNESS:   Tammy Fernandez is a 85 y.o. year old female with multiple medical problems including pulmonary hypertension, COPD, OSA, SIADH. Palliative Care was asked to help address goals of care.   CODE STATUS: full Code  PPS: 50% HOSPICE ELIGIBILITY/DIAGNOSIS: TBD  PHYSICAL EXAM:   General:patient sitting in recliner at home in NAD Cardiovascular: regular rate and rhythm. HR 71 Pulmonary:lung sounds diminished but no abnormal breath sounds heard, normal respiratory effort. O2 sat 99% on 2.5L  Abdomen: soft, nontender, + bowel sounds GU: no suprapubic tenderness Extremities:traceedema to left foot but no edema noted to right foot, no joint deformities Skin: no rashes Neurological: Weakness but otherwise nonfocal  PAST MEDICAL HISTORY:  Past Medical History:  Diagnosis Date  . Anxiety   . Chronic lower back pain   . Constipation    h/o fecal disimpaction on 2017  . CVA (cerebral vascular accident) ([place]) 2015   neg workup, MRI small acute lacunar infarcts. ASA only  . Depression   . Diastolic CHF, chronic (HCC)    grade I, most recent echo Jan 2019  . Fatty liver   . GERD (gastroesophageal reflux disease)   . Hyperlipidemia   . Hypertension   . Hyponatremia    multiple episodes with encephalopathy, renal thinks SIADH  . Hypothyroidism, unspecified   . Internal hemorrhoid   .  NSTEMI (non-ST elevated myocardial infarction) (HCC) 08/28/2016  . Palpitations   . PFO with atrial septal aneurysm    TTE 2015  . Pneumonia 2016  . Severe pulmonary arterial systolic hypertension (HCC) 12/25/2017   per echo, on 1L oxgen via [place]    SOCIAL HX:  Social History   Tobacco Use  . Smoking status: Never Smoker  . Smokeless tobacco: Former NeurosurgeonUser    Types: Snuff  . Tobacco comment: "used snuff when I was 19"  Substance Use Topics  . Alcohol use: No    ALLERGIES:  Allergies  Allergen Reactions  . Crestor [Rosuvastatin Calcium] Other (See Comments)    Muscle Aches  . Keflex [Cephalexin]  Other (See Comments)    dizziness  . Peanut Butter Flavor Hives  . Sulfa Antibiotics Hives  . Cephalexin Other (See Comments)    dizziness  . Sulfa Drugs Cross Reactors Rash     PERTINENT MEDICATIONS:  Outpatient Encounter Medications as of 05/13/2019  Medication Sig  . albuterol (PROVENTIL) (2.5 MG/3ML) 0.083% nebulizer solution Take 3 mLs (2.5 mg total) by nebulization every 4 (four) hours as needed for wheezing or shortness of breath.  . ALPRAZolam (XANAX) 1 MG tablet Take 1 tablet (1 mg total) by mouth 2 (two) times daily as needed for anxiety.  Marland Kitchen. amLODipine (NORVASC) 10 MG tablet Take 1 tablet (10 mg total) by mouth daily.  . Aspirin 81 MG EC tablet Take 81 mg by mouth daily.  . cetirizine (ZYRTEC) 10 MG tablet Take 10 mg by mouth daily.  Marland Kitchen. demeclocycline (DECLOMYCIN) 300 MG tablet Take 150 mg by mouth 2 (two) times a day.  . diclofenac sodium (VOLTAREN) 1 % GEL Apply 2 g topically 4 (four) times daily.  Marland Kitchen. docusate sodium (COLACE) 100 MG capsule Take 100 mg by mouth daily as needed for mild constipation.  . fluticasone (FLONASE) 50 MCG/ACT nasal spray Place 1 spray into both nostrils 2 (two) times daily.  Marland Kitchen. glycerin adult 2 g suppository Place 1 suppository rectally as needed for constipation.  Marland Kitchen. levothyroxine (SYNTHROID, LEVOTHROID) 50 MCG tablet Take 1 tablet (50 mcg total) by mouth daily before breakfast.  . meclizine (ANTIVERT) 12.5 MG tablet TAKE 1 TABLET BY MOUTH AS NEEDED FOR DIZZINESS  . nystatin cream (MYCOSTATIN) Apply 1 application topically 2 (two) times daily.  Marland Kitchen. olopatadine (PATANOL) 0.1 % ophthalmic solution Place 1 drop into both eyes daily as needed for allergies.  Marland Kitchen. omeprazole (PRILOSEC) 20 MG capsule Take 1 capsule (20 mg total) by mouth 2 (two) times daily before a meal.  . propranolol ER (INDERAL LA) 60 MG 24 hr capsule Take 1 capsule (60 mg total) by mouth daily.   No facility-administered encounter medications on file as of 05/13/2019.       Donnah Levert Marlena ClipperK Tesha Archambeau,  NP

## 2019-05-19 ENCOUNTER — Other Ambulatory Visit: Payer: Self-pay

## 2019-05-19 ENCOUNTER — Other Ambulatory Visit (HOSPITAL_COMMUNITY): Payer: Self-pay

## 2019-05-19 ENCOUNTER — Encounter: Payer: Self-pay | Admitting: Gastroenterology

## 2019-05-19 ENCOUNTER — Ambulatory Visit (INDEPENDENT_AMBULATORY_CARE_PROVIDER_SITE_OTHER): Payer: Medicare Other | Admitting: Gastroenterology

## 2019-05-19 VITALS — Ht 62.0 in | Wt 160.0 lb

## 2019-05-19 DIAGNOSIS — R1310 Dysphagia, unspecified: Secondary | ICD-10-CM | POA: Diagnosis not present

## 2019-05-19 NOTE — Progress Notes (Signed)
This service was provided via virtual visit.   Only audio was used.  The patient was located at home.  I was located in my office.  The patient did consent to this virtual visit and is aware of possible charges through their insurance for this visit.  The patient is an established patient.  Her son was on the phone with her my certified medical assistant, Barbaraann RondoKelly Tadros, contributed to this visit by contacting the patient by phone 1 or 2 business days prior to the appointment and also followed up on the recommendations I made after the visit.  Time spent on virtual visit: 21 minutes   HPI: This is a very pleasant almost 85 year old woman who I visited with today via telemedicine.  Her son was also with her.  I visited with her about 2 months ago by telemedicine also.  Her son was on the phone as well.  At that time her son told me that he could not detect any swallowing difficulty.  She really recovered since she was in the hospital.  We decided to simply watch her clinically given her age, frailty of multiple comorbidities.  She is doing pretty good from a swallowing perspective and certainly much better than when she was hospitalized.  She is concerned because her swallowing is not back to completely normal however.  She says that several times a day she will have to drink liquids after she eats solids to make sure it all goes down.  Her weight has been stable.  She is eating just about everything she wants.  Overall still much better than when in the hospital.  Chief complaint is dysphasia  ROS: complete GI ROS as described in HPI, all other review negative.  Constitutional:  No unintentional weight loss   Past Medical History:  Diagnosis Date  . Anxiety   . Chronic lower back pain   . Constipation    h/o fecal disimpaction on 2017  . CVA (cerebral vascular accident) (HCC) 2015   neg workup, MRI small acute lacunar infarcts. ASA only  . Depression   . Diastolic CHF, chronic (HCC)     grade I, most recent echo Jan 2019  . Fatty liver   . GERD (gastroesophageal reflux disease)   . Hyperlipidemia   . Hypertension   . Hyponatremia    multiple episodes with encephalopathy, renal thinks SIADH  . Hypothyroidism, unspecified   . Internal hemorrhoid   . NSTEMI (non-ST elevated myocardial infarction) (HCC) 08/28/2016  . Palpitations   . PFO with atrial septal aneurysm    TTE 2015  . Pneumonia 2016  . Severe pulmonary arterial systolic hypertension (HCC) 12/25/2017   per echo, on 1L oxgen via [place]    Past Surgical History:  Procedure Laterality Date  . ABDOMINAL HYSTERECTOMY    . CATARACT EXTRACTION W/ INTRAOCULAR LENS  IMPLANT, BILATERAL Bilateral   . CESAREAN SECTION    . TEE WITHOUT CARDIOVERSION N/A 01/19/2014   Procedure: TRANSESOPHAGEAL ECHOCARDIOGRAM (TEE);  Surgeon: Laurey Moralealton S McLean, MD;  [facility];  Service: Cardiovascular;  Laterality: N/A;  [name]/ja  . VAGINAL HYSTERECTOMY      Current Outpatient Medications  Medication Sig Dispense Refill  . albuterol (PROVENTIL) (2.5 MG/3ML) 0.083% nebulizer solution Take 3 mLs (2.5 mg total) by nebulization every 4 (four) hours as needed for wheezing or shortness of breath. 75 mL 5  . ALPRAZolam (XANAX) 1 MG tablet Take 1 tablet (1 mg total) by mouth 2 (two) times daily as needed  for anxiety. 60 tablet 3  . amLODipine (NORVASC) 10 MG tablet Take 1 tablet (10 mg total) by mouth daily. 90 tablet 1  . Aspirin 81 MG EC tablet Take 81 mg by mouth daily.    . cetirizine (ZYRTEC) 10 MG tablet Take 10 mg by mouth daily.    Marland Kitchen demeclocycline (DECLOMYCIN) 300 MG tablet Take 150 mg by mouth 2 (two) times a day.    . diclofenac sodium (VOLTAREN) 1 % GEL Apply 2 g topically 4 (four) times daily. 100 g 5  . docusate sodium (COLACE) 100 MG capsule Take 100 mg by mouth daily as needed for mild constipation.    . fluticasone (FLONASE) 50 MCG/ACT nasal spray Place 1 spray into both nostrils 2 (two) times daily. 16 g 5  .  glycerin adult 2 g suppository Place 1 suppository rectally as needed for constipation. 12 suppository 2  . levothyroxine (SYNTHROID, LEVOTHROID) 50 MCG tablet Take 1 tablet (50 mcg total) by mouth daily before breakfast. 30 tablet 5  . meclizine (ANTIVERT) 12.5 MG tablet TAKE 1 TABLET BY MOUTH AS NEEDED FOR DIZZINESS 30 tablet 0  . nystatin cream (MYCOSTATIN) Apply 1 application topically 2 (two) times daily. 30 g 2  . olopatadine (PATANOL) 0.1 % ophthalmic solution Place 1 drop into both eyes daily as needed for allergies. 5 mL 5  . omeprazole (PRILOSEC) 20 MG capsule Take 1 capsule (20 mg total) by mouth 2 (two) times daily before a meal. 180 capsule 3  . propranolol ER (INDERAL LA) 60 MG 24 hr capsule Take 1 capsule (60 mg total) by mouth daily. 90 capsule 1   No current facility-administered medications for this visit.     Allergies as of 05/19/2019 - Review Complete 05/19/2019  Allergen Reaction Noted  . Crestor [rosuvastatin calcium] Other (See Comments) 08/28/2016  . Keflex [cephalexin] Other (See Comments) 08/28/2016  . Peanut butter flavor Hives 01/20/2014  . Sulfa antibiotics Hives 08/28/2016  . Cephalexin Other (See Comments) 06/03/2014  . Sulfa drugs cross reactors Rash 08/03/2011    Family History  Problem Relation Age of Onset  . Stroke Father   . Hypertension Father   . Colon cancer Cousin   . Colon cancer Other        Aunt  . Diabetes Other        aunt and cousins  . Heart disease Other        cousins  . Healthy Daughter   . Healthy Son     Social History   Socioeconomic History  . Marital status: Widowed    Spouse name: Not on file  . Number of children: 7  . Years of education: Not on file  . Highest education level: Not on file  Occupational History  . Not on file  Social Needs  . Financial resource strain: Not on file  . Food insecurity    Worry: Not on file    Inability: Not on file  . Transportation needs    Medical: Not on file     Non-medical: Not on file  Tobacco Use  . Smoking status: Never Smoker  . Smokeless tobacco: Former Systems developer    Types: Snuff  . Tobacco comment: "used snuff when I was 19"  Substance and Sexual Activity  . Alcohol use: No  . Drug use: No  . Sexual activity: Not Currently  Lifestyle  . Physical activity    Days per week: Not on file    Minutes per session: Not  on file  . Stress: Not on file  Relationships  . Social Musicianconnections    Talks on phone: Not on file    Gets together: Not on file    Attends religious service: Not on file    Active member of club or organization: Not on file    Attends meetings of clubs or organizations: Not on file    Relationship status: Not on file  . Intimate partner violence    Fear of current or ex partner: Not on file    Emotionally abused: Not on file    Physically abused: Not on file    Forced sexual activity: Not on file  Other Topics Concern  . Not on file  Social History Narrative   ** Merged History Encounter **       Patient lives at home with her son           Physical Exam: Unable to perform because this was a "telemed visit" due to current Covid-19 pandemic  Assessment and plan: 85 y.o. female with dysphasia  She is nearly 85 years old, quite frail with multiple comorbidities.  Previous testing did not show any obstructive, stricturing problems that would be causing her dysphasia.  I suspect she has motility disturbance, probably longstanding.  I am very reluctant to proceed with endoscopic procedures and so is she.  I recommended that we retest her esophagus with a barium esophagram and also modified barium swallow study so that we can compare those results to the results on the same tests about 5 months ago.  She may does benefit from skilled outpatient therapy from speech therapy.  Certainly if there is stricturing disease now noticed then she would probably need endoscopic testing.  Please see the "Patient Instructions" section for  addition details about the plan.  Rob Buntinganiel , MD [facility] 05/19/2019, 2:31 PM

## 2019-05-19 NOTE — Patient Instructions (Addendum)
We will arrange for a barium esophagram and also a modified barium swallow study with speech therapy to compare to the same testing that she had 5 months ago.  Diagnosis dysphasia.  You have been scheduled for a Barium Esophogram at [facility] (1st floor of the hospital) on 05/27/19 at 12pmam. Please arrive 15 minutes prior to your appointment for registration. Make certain not to have anything to eat or drink 3 hours prior to your test. If you need to reschedule for any reason, please contact radiology at [phone] to do so. __________________________________________________________________ A barium swallow is an examination that concentrates on views of the esophagus. This tends to be a double contrast exam (barium and two liquids which, when combined, create a gas to distend the wall of the oesophagus) or single contrast (non-ionic iodine based). The study is usually tailored to your symptoms so a good history is essential. Attention is paid during the study to the form, structure and configuration of the esophagus, looking for functional disorders (such as aspiration, dysphagia, achalasia, motility and reflux) EXAMINATION You may be asked to change into a gown, depending on the type of swallow being performed. A radiologist and radiographer will perform the procedure. The radiologist will advise you of the type of contrast selected for your procedure and direct you during the exam. You will be asked to stand, sit or lie in several different positions and to hold a small amount of fluid in your mouth before being asked to swallow while the imaging is performed .In some instances you may be asked to swallow barium coated marshmallows to assess the motility of a solid food bolus. The exam can be recorded as a digital or video fluoroscopy procedure. POST PROCEDURE It will take 1-2 days for the barium to pass through your system. To facilitate this, it is important, unless otherwise directed, to  increase your fluids for the next 24-48hrs and to resume your normal diet.  This test typically takes about 30 minutes to perform. __________________________________________________________________________________  Bonita QuinYou have been scheduled for a modified barium swallow on 05/27/19 at 1130am. Please arrive 15 minutes prior to your test for registration. [facility] (1st [facility] for your appointment. Should you need to cancel or reschedule your appointment, please contact [phone] Patrcia Dolly([facility]) or [phone] Gerri Spore([place]). _____________________________________________________________________ A Modified Barium Swallow Study, or MBS, is a special x-ray that is taken to check swallowing skills. It is carried out by a Marine scientistadiologist and a Warehouse managerpeech Language Pathologist (SLP). During this test, yourmouth, throat, and esophagus, a muscular tube which connects your mouth to your stomach, is checked. The test will help you, your doctor, and the SLP plan what types of foods and liquids are easier for you to swallow. The SLP will also identify positions and ways to help you swallow more easily and safely. What will happen during an MBS? You will be taken to an x-ray room and seated comfortably. You will be asked to swallow small amounts of food and liquid mixed with barium. Barium is a liquid or paste that allows images of your mouth, throat and esophagus to be seen on x-ray. The x-ray captures moving images of the food you are swallowing as it travels from your mouth through your throat and into your esophagus. This test helps identify whether food or liquid is entering your lungs (aspiration). The test also shows which part of your mouth or throat lacks strength or coordination to move the food or liquid in  the right direction. This test typically takes 30 minutes to 1 hour to complete. _______________________________________________________________________ Thank you for entrusting me with  your care and choosing [facility].  Dr Ardis Hughs

## 2019-05-26 ENCOUNTER — Ambulatory Visit (HOSPITAL_COMMUNITY): Payer: Medicare Other

## 2019-05-26 ENCOUNTER — Other Ambulatory Visit: Payer: Self-pay

## 2019-05-26 ENCOUNTER — Other Ambulatory Visit: Payer: Medicare Other | Admitting: Adult Health Nurse Practitioner

## 2019-05-26 DIAGNOSIS — Z515 Encounter for palliative care: Secondary | ICD-10-CM

## 2019-05-26 NOTE — Progress Notes (Signed)
[facility], [facility] Consult Note Telephone: [phone]  Fax: [phone]  PATIENT NAME: Tammy BeachRosa L Rounds DOB: 1924/09/30 MRN: [id number]  PRIMARY CARE PROVIDER:   Myles LippsSantiago, Irma M, MD  REFERRING PROVIDER:  Myles LippsSantiago, Irma M, MD [address] [place] [id number]  RESPONSIBLE PARTY:   Mare FerrariJerome Bringhurst, son [phone]    RECOMMENDATIONS and PLAN:  1.  Cough has improved.  Believe to be related to allergies.  Patient continues to take her allergy medicine. Does complain of intermittent ear pain and fullness, especially with lying down. She is worried about this but educated her that it is related to her allergies and sinus congestion.  She has no cough and does not sound congested today.  Continue current allergy meds.  2.  Dyspnea.  She continues to wear her CPAP but was concerned one morning when her son took it off and she states that she could hear him but could not respond.  Explained to her that her response may be related to how deep a sleep she may have been in when her son tried to wake her.  She was concerned that the machine was making her memory worse.  Explained to her that the machine would actually help her memory and explained briefly how it works.  She does state that since that episode she splits up her CPAP use to twice a day and still gets 4-5 hours of use on it day.  Told her that was fine as long as she was still using it.    3. GERD.  She is still having occasional reflux symptoms but states it is not as bad as what it was.  Continue current dose of prilosec  I spent 45 minutes providing this consultation,  from 1:30 to 2:15. More than 50% of the time in this consultation was spent coordinating communication.   HISTORY OF PRESENT ILLNESS:  Tammy Fernandez is a 85 y.o. year old female with multiple medical problems including pulmonary hypertension, COPD, OSA, SIADH. Palliative Care was asked to help address goals of care.   CODE  STATUS: Full code  PPS: 50% HOSPICE ELIGIBILITY/DIAGNOSIS: TBD  PHYSICAL EXAM:   General:patient sitting on couchat home in NAD Cardiovascular: regular rate and rhythm. HR 62 Pulmonary:lung sounds diminished but no abnormal breath sounds heard, normal respiratory effort. O2 sat 99% on 2.5L  Abdomen: soft, nontender, + bowel sounds GU: no suprapubic tenderness Extremities:traceedema tobilateral feet, no joint deformities Skin: no rashes Neurological: Weakness but otherwise nonfocal PAST MEDICAL HISTORY:  Past Medical History:  Diagnosis Date  . Anxiety   . Chronic lower back pain   . Constipation    h/o fecal disimpaction on 2017  . CVA (cerebral vascular accident) (HCC) 2015   neg workup, MRI small acute lacunar infarcts. ASA only  . Depression   . Diastolic CHF, chronic (HCC)    grade I, most recent echo Jan 2019  . Fatty liver   . GERD (gastroesophageal reflux disease)   . Hyperlipidemia   . Hypertension   . Hyponatremia    multiple episodes with encephalopathy, renal thinks SIADH  . Hypothyroidism, unspecified   . Internal hemorrhoid   . NSTEMI (non-ST elevated myocardial infarction) (HCC) 08/28/2016  . Palpitations   . PFO with atrial septal aneurysm    TTE 2015  . Pneumonia 2016  . Severe pulmonary arterial systolic hypertension (HCC) 12/25/2017   per echo, on 1L oxgen via [place]    SOCIAL HX:  Social History   Tobacco Use  . Smoking status: Never Smoker  . Smokeless tobacco: Former Systems developer    Types: Snuff  . Tobacco comment: "used snuff when I was 19"  Substance Use Topics  . Alcohol use: No    ALLERGIES:  Allergies  Allergen Reactions  . Crestor [Rosuvastatin Calcium] Other (See Comments)    Muscle Aches  . Keflex [Cephalexin] Other (See Comments)    dizziness  . Peanut Butter Flavor Hives  . Sulfa Antibiotics Hives  . Cephalexin Other (See Comments)    dizziness  . Sulfa Drugs Cross Reactors Rash     PERTINENT MEDICATIONS:  Outpatient  Encounter Medications as of 05/26/2019  Medication Sig  . albuterol (PROVENTIL) (2.5 MG/3ML) 0.083% nebulizer solution Take 3 mLs (2.5 mg total) by nebulization every 4 (four) hours as needed for wheezing or shortness of breath.  . ALPRAZolam (XANAX) 1 MG tablet Take 1 tablet (1 mg total) by mouth 2 (two) times daily as needed for anxiety.  Marland Kitchen amLODipine (NORVASC) 10 MG tablet Take 1 tablet (10 mg total) by mouth daily.  . Aspirin 81 MG EC tablet Take 81 mg by mouth daily.  . cetirizine (ZYRTEC) 10 MG tablet Take 10 mg by mouth daily.  Marland Kitchen demeclocycline (DECLOMYCIN) 300 MG tablet Take 150 mg by mouth 2 (two) times a day.  . diclofenac sodium (VOLTAREN) 1 % GEL Apply 2 g topically 4 (four) times daily.  Marland Kitchen docusate sodium (COLACE) 100 MG capsule Take 100 mg by mouth daily as needed for mild constipation.  . fluticasone (FLONASE) 50 MCG/ACT nasal spray Place 1 spray into both nostrils 2 (two) times daily.  Marland Kitchen glycerin adult 2 g suppository Place 1 suppository rectally as needed for constipation.  Marland Kitchen levothyroxine (SYNTHROID, LEVOTHROID) 50 MCG tablet Take 1 tablet (50 mcg total) by mouth daily before breakfast.  . meclizine (ANTIVERT) 12.5 MG tablet TAKE 1 TABLET BY MOUTH AS NEEDED FOR DIZZINESS  . nystatin cream (MYCOSTATIN) Apply 1 application topically 2 (two) times daily.  Marland Kitchen olopatadine (PATANOL) 0.1 % ophthalmic solution Place 1 drop into both eyes daily as needed for allergies.  Marland Kitchen omeprazole (PRILOSEC) 20 MG capsule Take 1 capsule (20 mg total) by mouth 2 (two) times daily before a meal.  . propranolol ER (INDERAL LA) 60 MG 24 hr capsule Take 1 capsule (60 mg total) by mouth daily.   No facility-administered encounter medications on file as of 05/26/2019.       Loyd Marhefka Jenetta Downer, NP

## 2019-05-27 ENCOUNTER — Other Ambulatory Visit: Payer: Self-pay

## 2019-05-27 ENCOUNTER — Ambulatory Visit (HOSPITAL_COMMUNITY)
Admission: RE | Admit: 2019-05-27 | Discharge: 2019-05-27 | Disposition: A | Discharge status: Home / Self Care | Payer: Medicare Other | Source: Ambulatory Visit | Attending: Gastroenterology | Admitting: Gastroenterology

## 2019-05-27 DIAGNOSIS — R1310 Dysphagia, unspecified: Secondary | ICD-10-CM | POA: Diagnosis not present

## 2019-05-27 DIAGNOSIS — R05 Cough: Secondary | ICD-10-CM | POA: Diagnosis not present

## 2019-05-27 DIAGNOSIS — K219 Gastro-esophageal reflux disease without esophagitis: Secondary | ICD-10-CM | POA: Diagnosis not present

## 2019-05-27 NOTE — Therapy (Signed)
Modified Barium Swallow Progress Note  Patient Details  Name: Tammy Fernandez MRN: [id number] Date of Birth: 07/22/1924  Today's Date: 05/27/2019  Modified Barium Swallow completed.  Full report located under Chart Review in the Imaging Section.  Brief recommendations include the following:  History: 85 yr old seen for outpatient MBS with history of GERD, NSTEMI, CVA 2015, pna, presbyesophagus with poor primary esophageal stripping, tertiary wave contractions per barium esophagram 12/2018. Pt reported to MD she "doesn't feel her swallow is back to normal and I still have to drink liquids when I eat."  Clinical Impression  Pt's swallow function was similar with slight improvements to study in 12/2018. She was able to masticate solid and transit thin without overt difficulty compared to previous study. Strength and ROM was adequate during study. Pharyngeally, her timing to close larynx was late allowing thin barium to enter laryngeal vestibule prior to full reflexive protection. No sensation to penetrates however was cleared during prompts to clear throat and was not aspirated. Barium observed at distal esophagus (known presbyesophagus and having barium swallow after MBS). Recommend regular textures, thin liquids, crush meds if able and produce a throat clear/cough after swallows intermittently during meals. Briefly educated pt re: reflux precautions prior to technician transporting across the hall for esophagram. Disussed with pt due to severity of esophageal dysphagia she will need to continue reflux precautions long term.      Orbie Pyo [place].Ed Actor Pager [phone] Office [phone]    Swallow Evaluation Recommendations       SLP Diet Recommendations: Regular solids;Thin liquid   Liquid Administration via: Cup;Straw   Medication Administration: Whole meds with liquid   Supervision: Patient able to self feed;Intermittent supervision to cue for  compensatory strategies   Compensations: Slow rate;Small sips/bites;Clear throat intermittently   Postural Changes: Remain semi-upright after after feeds/meals (Comment);Seated upright at 90 degrees   Oral Care Recommendations: Oral care BID        Houston Siren 05/27/2019,1:50 PM   Orbie Pyo [place].Ed Risk analyst [phone] Office [phone]

## 2019-06-01 ENCOUNTER — Telehealth: Payer: Self-pay | Admitting: Family Medicine

## 2019-06-01 NOTE — Telephone Encounter (Signed)
Per patient son she needs her thyroid medicine refilled

## 2019-06-02 ENCOUNTER — Ambulatory Visit (INDEPENDENT_AMBULATORY_CARE_PROVIDER_SITE_OTHER): Payer: Medicare Other | Admitting: Family Medicine

## 2019-06-02 ENCOUNTER — Ambulatory Visit: Payer: Medicare Other | Admitting: Family Medicine

## 2019-06-02 ENCOUNTER — Encounter: Payer: Self-pay | Admitting: Family Medicine

## 2019-06-02 ENCOUNTER — Other Ambulatory Visit: Payer: Self-pay

## 2019-06-02 VITALS — BP 139/59 | HR 54 | Temp 97.7°F | Resp 16 | Ht 62.0 in | Wt 170.0 lb

## 2019-06-02 DIAGNOSIS — E039 Hypothyroidism, unspecified: Secondary | ICD-10-CM | POA: Diagnosis not present

## 2019-06-02 DIAGNOSIS — I10 Essential (primary) hypertension: Secondary | ICD-10-CM | POA: Diagnosis not present

## 2019-06-02 DIAGNOSIS — E222 Syndrome of inappropriate secretion of antidiuretic hormone: Secondary | ICD-10-CM | POA: Diagnosis not present

## 2019-06-02 DIAGNOSIS — H9203 Otalgia, bilateral: Secondary | ICD-10-CM

## 2019-06-02 DIAGNOSIS — D649 Anemia, unspecified: Secondary | ICD-10-CM

## 2019-06-02 DIAGNOSIS — E871 Hypo-osmolality and hyponatremia: Secondary | ICD-10-CM | POA: Diagnosis not present

## 2019-06-02 DIAGNOSIS — R7881 Bacteremia: Secondary | ICD-10-CM | POA: Diagnosis not present

## 2019-06-02 DIAGNOSIS — B9562 Methicillin resistant Staphylococcus aureus infection as the cause of diseases classified elsewhere: Secondary | ICD-10-CM | POA: Diagnosis not present

## 2019-06-02 DIAGNOSIS — R42 Dizziness and giddiness: Secondary | ICD-10-CM

## 2019-06-02 DIAGNOSIS — I6789 Other cerebrovascular disease: Secondary | ICD-10-CM | POA: Diagnosis not present

## 2019-06-02 DIAGNOSIS — J9601 Acute respiratory failure with hypoxia: Secondary | ICD-10-CM | POA: Diagnosis not present

## 2019-06-02 MED ORDER — MOMETASONE FUROATE 50 MCG/ACT NA SUSP: 2.0000 | Freq: Every day | NASAL | 4 refills | Status: DC

## 2019-06-02 MED ORDER — DICLOFENAC SODIUM 1 % TD GEL: 2.0000 g | Freq: Four times a day (QID) | TRANSDERMAL | 5 refills | Status: DC

## 2019-06-02 MED ORDER — ALBUTEROL SULFATE (2.5 MG/3ML) 0.083% IN NEBU: 2.5000 mg | INHALATION_SOLUTION | RESPIRATORY_TRACT | 5 refills | Status: DC | PRN

## 2019-06-02 MED ORDER — MECLIZINE HCL 12.5 MG PO TABS: 12.5000 mg | ORAL_TABLET | Freq: Three times a day (TID) | ORAL | 3 refills | Status: DC | PRN

## 2019-06-02 MED ORDER — LEVOTHYROXINE SODIUM 50 MCG PO TABS: 50.0000 ug | ORAL_TABLET | Freq: Every day | ORAL | 5 refills | Status: DC

## 2019-06-02 NOTE — Progress Notes (Signed)
Established Patient Office Visit  Subjective:  Patient ID: Tammy Fernandez, female    DOB: 14-Nov-1924  Age: 85 y.o. MRN: [id number]  CC:  Chief Complaint  Patient presents with  . medical conditions/santiago pt    pt c/o ear pain both ears and wants them checked, ? sinusitis  . Medication Refill    voltaren, synthroid, and  antivert    HPI Tammy Fernandez presents for    She uses her CPAP 4 hours at night She states that the mask gives her sores on her face She cannot tolerate the mask She is being monitored for acidosis by tracking her CO2   Anemia  Pt reports that she has a history of high CO2 She states that she uses 2L [place] oxygen daily  Hyponatremia She reports that she has no energy at all She has SIADH  She is taking the medication as instructed.  Otalgia- left more than right One month history of ear pain She states that it has been flushed before due to wax build up She denies ringing in her ears She is already on bid flonase and zyrtec   Past Medical History:  Diagnosis Date  . Anxiety   . Chronic lower back pain   . Constipation    h/o fecal disimpaction on 2017  . CVA (cerebral vascular accident) ([place]) 2015   neg workup, MRI small acute lacunar infarcts. ASA only  . Depression   . Diastolic CHF, chronic (HCC)    grade I, most recent echo Jan 2019  . Fatty liver   . GERD (gastroesophageal reflux disease)   . Hyperlipidemia   . Hypertension   . Hyponatremia    multiple episodes with encephalopathy, renal thinks SIADH  . Hypothyroidism, unspecified   . Internal hemorrhoid   . NSTEMI (non-ST elevated myocardial infarction) ([place]) 08/28/2016  . Palpitations   . PFO with atrial septal aneurysm    TTE 2015  . Pneumonia 2016  . Severe pulmonary arterial systolic hypertension ([place]) 12/25/2017   per echo, on 1L oxgen via [place]    Past Surgical History:  Procedure Laterality Date  . ABDOMINAL HYSTERECTOMY    . CATARACT EXTRACTION W/ INTRAOCULAR LENS   IMPLANT, BILATERAL Bilateral   . CESAREAN SECTION    . TEE WITHOUT CARDIOVERSION N/A 01/19/2014   Procedure: TRANSESOPHAGEAL ECHOCARDIOGRAM (TEE);  Surgeon: Larey Dresser, MD;  Location: [facility];  Service: Cardiovascular;  Laterality: N/A;  [name]  . VAGINAL HYSTERECTOMY      Family History  Problem Relation Age of Onset  . Stroke Father   . Hypertension Father   . Colon cancer Cousin   . Colon cancer Other        Aunt  . Diabetes Other        aunt and cousins  . Heart disease Other        cousins  . Healthy Daughter   . Healthy Son     Social History   Socioeconomic History  . Marital status: Widowed    Spouse name: Not on file  . Number of children: 7  . Years of education: Not on file  . Highest education level: Not on file  Occupational History  . Not on file  Social Needs  . Financial resource strain: Not on file  . Food insecurity    Worry: Not on file    Inability: Not on file  . Transportation needs    Medical: Not on file  Non-medical: Not on file  Tobacco Use  . Smoking status: Never Smoker  . Smokeless tobacco: Former Systems developer    Types: Snuff  . Tobacco comment: "used snuff when I was 19"  Substance and Sexual Activity  . Alcohol use: No  . Drug use: No  . Sexual activity: Not Currently  Lifestyle  . Physical activity    Days per week: Not on file    Minutes per session: Not on file  . Stress: Not on file  Relationships  . Social Herbalist on phone: Not on file    Gets together: Not on file    Attends religious service: Not on file    Active member of club or organization: Not on file    Attends meetings of clubs or organizations: Not on file    Relationship status: Not on file  . Intimate partner violence    Fear of current or ex partner: Not on file    Emotionally abused: Not on file    Physically abused: Not on file    Forced sexual activity: Not on file  Other Topics Concern  . Not on file  Social History Narrative    ** Merged History Encounter **       Patient lives at home with her son          Outpatient Medications Prior to Visit  Medication Sig Dispense Refill  . ALPRAZolam (XANAX) 1 MG tablet Take 1 tablet (1 mg total) by mouth 2 (two) times daily as needed for anxiety. 60 tablet 3  . amLODipine (NORVASC) 10 MG tablet Take 1 tablet (10 mg total) by mouth daily. (Patient taking differently: Take 5 mg by mouth daily. ) 90 tablet 1  . Aspirin 81 MG EC tablet Take 81 mg by mouth daily.    . cetirizine (ZYRTEC) 10 MG tablet Take 10 mg by mouth daily.    Marland Kitchen demeclocycline (DECLOMYCIN) 300 MG tablet Take 150 mg by mouth 2 (two) times a day.    . docusate sodium (COLACE) 100 MG capsule Take 100 mg by mouth daily as needed for mild constipation.    . fluticasone (FLONASE) 50 MCG/ACT nasal spray Place 1 spray into both nostrils 2 (two) times daily. 16 g 5  . glycerin adult 2 g suppository Place 1 suppository rectally as needed for constipation. 12 suppository 2  . nystatin cream (MYCOSTATIN) Apply 1 application topically 2 (two) times daily. 30 g 2  . olopatadine (PATANOL) 0.1 % ophthalmic solution Place 1 drop into both eyes daily as needed for allergies. 5 mL 5  . omeprazole (PRILOSEC) 20 MG capsule Take 1 capsule (20 mg total) by mouth 2 (two) times daily before a meal. 180 capsule 3  . propranolol ER (INDERAL LA) 60 MG 24 hr capsule Take 1 capsule (60 mg total) by mouth daily. 90 capsule 1  . albuterol (PROVENTIL) (2.5 MG/3ML) 0.083% nebulizer solution Take 3 mLs (2.5 mg total) by nebulization every 4 (four) hours as needed for wheezing or shortness of breath. 75 mL 5  . diclofenac sodium (VOLTAREN) 1 % GEL Apply 2 g topically 4 (four) times daily. 100 g 5  . levothyroxine (SYNTHROID, LEVOTHROID) 50 MCG tablet Take 1 tablet (50 mcg total) by mouth daily before breakfast. 30 tablet 5  . meclizine (ANTIVERT) 12.5 MG tablet TAKE 1 TABLET BY MOUTH AS NEEDED FOR DIZZINESS 30 tablet 0   No  facility-administered medications prior to visit.     Allergies  Allergen Reactions  . Crestor [Rosuvastatin Calcium] Other (See Comments)    Muscle Aches  . Keflex [Cephalexin] Other (See Comments)    dizziness  . Peanut Butter Flavor Hives  . Sulfa Antibiotics Hives  . Cephalexin Other (See Comments)    dizziness  . Sulfa Drugs Cross Reactors Rash    ROS Review of Systems Review of Systems  Constitutional: Negative for activity change, appetite change, chills and fever.  HENT: Negative for congestion, nosebleeds, trouble swallowing and voice change.   Respiratory: Negative for cough, +shortness of breath + wheezing.   Gastrointestinal: Negative for diarrhea, nausea and vomiting.  Genitourinary: Negative for difficulty urinating, dysuria, flank pain and hematuria.  Neurological: Negative for dizziness, speech difficulty, light-headedness and numbness.  See HPI. All other review of systems negative.     Objective:    Physical Exam  Constitutional: She is oriented to person, place, and time. She appears well-developed and well-nourished.  Sitting in wheelchair  Eyes: Conjunctivae are normal.  Cardiovascular: Normal rate and regular rhythm.  Pulmonary/Chest: Effort normal and breath sounds normal. No respiratory distress. She has no wheezes. She has no rales.  Abdominal: Soft. Bowel sounds are normal. She exhibits no distension and no mass. There is no abdominal tenderness. There is no rebound and no guarding.  Neurological: She is alert and oriented to person, place, and time.  Skin: Skin is warm. No erythema.  Psychiatric: She has a normal mood and affect. Her behavior is normal. Judgment and thought content normal.    BP (!) 139/59 (BP Location: Right Arm, Patient Position: Sitting, Cuff Size: Large)   Pulse (!) 54   Temp 97.7 F (36.5 C) (Oral)   Resp 16   Ht 5' 2"  (1.575 m)   Wt 170 lb (77.1 kg)   SpO2 100%   BMI 31.09 kg/m  Wt Readings from Last 3 Encounters:   06/02/19 170 lb (77.1 kg)  05/19/19 160 lb (72.6 kg)  03/25/19 161 lb (73 kg)     Health Maintenance Due  Topic Date Due  . TETANUS/TDAP  07/30/1945  . DEXA SCAN  07/31/1991    There are no preventive care reminders to display for this patient.  Lab Results  Component Value Date   TSH 0.647 12/28/2018   Lab Results  Component Value Date   WBC 9.1 05/04/2019   HGB 9.9 (L) 05/04/2019   HCT 31.4 (L) 05/04/2019   MCV 89 05/04/2019   PLT 262 05/04/2019   Lab Results  Component Value Date   NA 132 (L) 05/04/2019   K 4.7 05/04/2019   CO2 31 (H) 05/04/2019   GLUCOSE 141 (H) 05/04/2019   BUN 14 05/04/2019   CREATININE 0.70 05/04/2019   BILITOT <0.2 05/04/2019   ALKPHOS 100 05/04/2019   AST 22 05/04/2019   ALT 7 05/04/2019   PROT 7.3 05/04/2019   ALBUMIN 3.9 05/04/2019   CALCIUM 9.6 05/04/2019   ANIONGAP 11 01/07/2019   Lab Results  Component Value Date   CHOL 181 08/29/2016   Lab Results  Component Value Date   HDL 37 (L) 08/29/2016   Lab Results  Component Value Date   LDLCALC 124 (H) 08/29/2016   Lab Results  Component Value Date   TRIG 98 08/29/2016   Lab Results  Component Value Date   CHOLHDL 4.9 08/29/2016   Lab Results  Component Value Date   HGBA1C 7.2 (H) 10/31/2015      Assessment & Plan:   Problem List Items Addressed  This Visit      Cardiovascular and Mediastinum   Essential hypertension (Chronic)  - bp in a good range Continue current dose     Endocrine   Hypothyroidism (Chronic)-  Will recheck   Relevant Medications   levothyroxine (SYNTHROID) 50 MCG tablet   Other Relevant Orders   TSH    Other Visit Diagnoses    SIADH (syndrome of inappropriate ADH production) ([place])    -  Primary Continue fluid restriction, continue monthly monitoring  Avoid diluting salts in blood   Relevant Orders   CMP14+EGFR   TSH   Hyponatremia       Relevant Orders   CMP14+EGFR   Otalgia of both ears    -    Anemia, unspecified type    -  reviewed overall trend   Relevant Orders   CBC   Dizziness    - continue prn meclizine   Relevant Medications   meclizine (ANTIVERT) 12.5 MG tablet      Meds ordered this encounter  Medications  . meclizine (ANTIVERT) 12.5 MG tablet    Sig: Take 1 tablet (12.5 mg total) by mouth 3 (three) times daily as needed for dizziness.    Dispense:  30 tablet    Refill:  3  . levothyroxine (SYNTHROID) 50 MCG tablet    Sig: Take 1 tablet (50 mcg total) by mouth daily before breakfast.    Dispense:  30 tablet    Refill:  5  . diclofenac sodium (VOLTAREN) 1 % GEL    Sig: Apply 2 g topically 4 (four) times daily.    Dispense:  100 g    Refill:  5  . albuterol (PROVENTIL) (2.5 MG/3ML) 0.083% nebulizer solution    Sig: Take 3 mLs (2.5 mg total) by nebulization every 4 (four) hours as needed for wheezing or shortness of breath.    Dispense:  75 mL    Refill:  5    Follow-up: No follow-ups on file.    Forrest Moron, MD

## 2019-06-02 NOTE — Patient Instructions (Signed)
° ° ° °  If you have lab work done today you will be contacted with your lab results within the next 2 weeks.  If you have not heard from us then please contact us. The fastest way to get your results is to register for My Chart. ° ° °IF you received an x-ray today, you will receive an invoice from [facility]. Please contact [facility] at [phone] with questions or concerns regarding your invoice.  ° °IF you received labwork today, you will receive an invoice from [facility]. Please contact [facility] at [phone] with questions or concerns regarding your invoice.  ° °Our billing staff will not be able to assist you with questions regarding bills from these companies. ° °You will be contacted with the lab results as soon as they are available. The fastest way to get your results is to activate your My Chart account. Instructions are located on the last page of this paperwork. If you have not heard from us regarding the results in 2 weeks, please contact this office. °  ° ° ° °

## 2019-06-03 ENCOUNTER — Other Ambulatory Visit: Payer: Medicare Other | Admitting: Adult Health Nurse Practitioner

## 2019-06-03 DIAGNOSIS — Z515 Encounter for palliative care: Secondary | ICD-10-CM

## 2019-06-03 LAB — CMP14+EGFR
ALT: 6 IU/L (ref 0–32)
AST: 19 IU/L (ref 0–40)
Albumin/Globulin Ratio: 1.1 — ABNORMAL LOW (ref 1.2–2.2)
Albumin: 3.8 g/dL (ref 3.5–4.6)
Alkaline Phosphatase: 92 IU/L (ref 39–117)
BUN/Creatinine Ratio: 10 — ABNORMAL LOW (ref 12–28)
BUN: 6 mg/dL — ABNORMAL LOW (ref 10–36)
Bilirubin Total: 0.3 mg/dL (ref 0.0–1.2)
CO2: 40 mmol/L (ref 20–29)
Calcium: 9.9 mg/dL (ref 8.7–10.3)
Chloride: 83 mmol/L — ABNORMAL LOW (ref 96–106)
Creatinine, Ser: 0.6 mg/dL (ref 0.57–1.00)
GFR calc Af Amer: 92 mL/min/{1.73_m2} (ref >59)
GFR calc non Af Amer: 79 mL/min/{1.73_m2} (ref >59)
Globulin, Total: 3.4 g/dL (ref 1.5–4.5)
Glucose: 90 mg/dL (ref 65–99)
Potassium: 4.2 mmol/L (ref 3.5–5.2)
Sodium: 137 mmol/L (ref 134–144)
Total Protein: 7.2 g/dL (ref 6.0–8.5)

## 2019-06-03 LAB — CBC
Hematocrit: 31.8 % — ABNORMAL LOW (ref 34.0–46.6)
Hemoglobin: 9.9 g/dL — ABNORMAL LOW (ref 11.1–15.9)
MCH: 28.3 pg (ref 26.6–33.0)
MCHC: 31.1 g/dL — ABNORMAL LOW (ref 31.5–35.7)
MCV: 91 fL (ref 79–97)
Platelets: 260 10*3/uL (ref 150–450)
RBC: 3.5 x10E6/uL — ABNORMAL LOW (ref 3.77–5.28)
RDW: 12.7 % (ref 11.7–15.4)
WBC: 6.8 10*3/uL (ref 3.4–10.8)

## 2019-06-03 LAB — TSH: TSH: 1.48 u[IU]/mL (ref 0.450–4.500)

## 2019-06-03 NOTE — Progress Notes (Signed)
[facility] Consult Note Telephone: [phone]  Fax: [phone]  PATIENT NAME: Tammy Fernandez DOB: 12-11-1923 MRN: [id number]  PRIMARY CARE PROVIDER:   Rutherford Guys, MD  REFERRING PROVIDER:  Rutherford Guys, MD [address] [place],  [place] [address]  RESPONSIBLE PARTY:   Mariangela Heldt, son [phone]  Due to the COVID-19 crisis, this visit was done via telemedicine and it was initiated and consent by this patient and or family. Video-audio (telehealth) contact was unable to be done due to technical barriers from the patient's side.     RECOMMENDATIONS and PLAN:  1.  Otalgia.  Patient is having ear pain in bilateral ears.  Son states that they went to PCP's office yesterday and was told that she had fluid behind her ear drums.  She had been complaining of intermittent ear pain with dizziness for about 3-4 weeks now. Now is having headaches associated with it. She has had increased nasal drainage due to allergies.  She takes flonase BID and zyrtec.  Was instructed at [facility] office to try a different nasal spray such as Nasonex or Nasocort as she has been on the flonase from several years.  Did suggest that trying a different nasal spray could help.  Also suggested trying claritin D or allegra D for just 3 days to since she has been having these symptoms for a long time and the congestion is just not clearing up.  Did instruct not to take the decongestant form for more than 3 days and to monitor BP closely as it can increase her BP.    I spent 20 minutes providing this consultation,  from 11:30 to 11:50. More than 50% of the time in this consultation was spent coordinating communication.   HISTORY OF PRESENT ILLNESS:  Tammy Fernandez is a 85 y.o. year old female with multiple medical problems including pulmonary hypertension, COPD, OSA, SIADH. Palliative Care was asked to help address goals of care.   CODE STATUS: Full Code  PPS: 50%  HOSPICE ELIGIBILITY/DIAGNOSIS: TBD  PAST MEDICAL HISTORY:  Past Medical History:  Diagnosis Date  . Anxiety   . Chronic lower back pain   . Constipation    h/o fecal disimpaction on 2017  . CVA (cerebral vascular accident) ([facility]) 2015   neg workup, MRI small acute lacunar infarcts. ASA only  . Depression   . Diastolic CHF, chronic (HCC)    grade I, most recent echo Jan 2019  . Fatty liver   . GERD (gastroesophageal reflux disease)   . Hyperlipidemia   . Hypertension   . Hyponatremia    multiple episodes with encephalopathy, renal thinks SIADH  . Hypothyroidism, unspecified   . Internal hemorrhoid   . NSTEMI (non-ST elevated myocardial infarction) ([facility]) 08/28/2016  . Palpitations   . PFO with atrial septal aneurysm    TTE 2015  . Pneumonia 2016  . Severe pulmonary arterial systolic hypertension ([place]) 12/25/2017   per echo, on 1L oxgen via [place]    SOCIAL HX:  Social History   Tobacco Use  . Smoking status: Never Smoker  . Smokeless tobacco: Former Systems developer    Types: Snuff  . Tobacco comment: "used snuff when I was 19"  Substance Use Topics  . Alcohol use: No    ALLERGIES:  Allergies  Allergen Reactions  . Crestor [Rosuvastatin Calcium] Other (See Comments)    Muscle Aches  . Keflex [Cephalexin] Other (See Comments)    dizziness  . Peanut  Butter Flavor Hives  . Sulfa Antibiotics Hives  . Cephalexin Other (See Comments)    dizziness  . Sulfa Drugs Cross Reactors Rash     PERTINENT MEDICATIONS:  Outpatient Encounter Medications as of 06/03/2019  Medication Sig  . albuterol (PROVENTIL) (2.5 MG/3ML) 0.083% nebulizer solution Take 3 mLs (2.5 mg total) by nebulization every 4 (four) hours as needed for wheezing or shortness of breath.  . ALPRAZolam (XANAX) 1 MG tablet Take 1 tablet (1 mg total) by mouth 2 (two) times daily as needed for anxiety.  Marland Kitchen. amLODipine (NORVASC) 10 MG tablet Take 1 tablet (10 mg total) by mouth daily. (Patient taking differently: Take 5 mg by mouth  daily. )  . Aspirin 81 MG EC tablet Take 81 mg by mouth daily.  . cetirizine (ZYRTEC) 10 MG tablet Take 10 mg by mouth daily.  Marland Kitchen. demeclocycline (DECLOMYCIN) 300 MG tablet Take 150 mg by mouth 2 (two) times a day.  . diclofenac sodium (VOLTAREN) 1 % GEL Apply 2 g topically 4 (four) times daily.  Marland Kitchen. docusate sodium (COLACE) 100 MG capsule Take 100 mg by mouth daily as needed for mild constipation.  . fluticasone (FLONASE) 50 MCG/ACT nasal spray Place 1 spray into both nostrils 2 (two) times daily.  Marland Kitchen. glycerin adult 2 g suppository Place 1 suppository rectally as needed for constipation.  Marland Kitchen. levothyroxine (SYNTHROID) 50 MCG tablet Take 1 tablet (50 mcg total) by mouth daily before breakfast.  . meclizine (ANTIVERT) 12.5 MG tablet Take 1 tablet (12.5 mg total) by mouth 3 (three) times daily as needed for dizziness.  . mometasone (NASONEX) 50 MCG/ACT nasal spray Place 2 sprays into the nose daily.  Marland Kitchen. nystatin cream (MYCOSTATIN) Apply 1 application topically 2 (two) times daily.  Marland Kitchen. olopatadine (PATANOL) 0.1 % ophthalmic solution Place 1 drop into both eyes daily as needed for allergies.  Marland Kitchen. omeprazole (PRILOSEC) 20 MG capsule Take 1 capsule (20 mg total) by mouth 2 (two) times daily before a meal.  . propranolol ER (INDERAL LA) 60 MG 24 hr capsule Take 1 capsule (60 mg total) by mouth daily.   No facility-administered encounter medications on file as of 06/03/2019.      Rusti Arizmendi Marlena ClipperK Jiyah Torpey, NP

## 2019-06-04 ENCOUNTER — Other Ambulatory Visit: Payer: Self-pay | Admitting: Family Medicine

## 2019-06-04 ENCOUNTER — Other Ambulatory Visit: Payer: Self-pay | Admitting: *Deleted

## 2019-06-04 ENCOUNTER — Telehealth: Payer: Self-pay | Admitting: Family Medicine

## 2019-06-04 MED ORDER — LEVOTHYROXINE SODIUM 50 MCG PO TABS: 50.0000 ug | ORAL_TABLET | Freq: Every day | ORAL | 11 refills | Status: DC

## 2019-06-04 NOTE — Telephone Encounter (Signed)
Pt requested call to discuss labwork

## 2019-06-04 NOTE — Telephone Encounter (Signed)
Labs discussed with patient and her son Patient also complaining of vaginal rash, sore, she reports bump on outside, itchy Uses adult diaper Needs to be seen In the meanwhile can use A+D ointment Has been skipping uses of cpap on saturday

## 2019-06-04 NOTE — Telephone Encounter (Signed)
Copied from [place] [id number]. Topic: Quick Communication - Lab Results (Clinic Use ONLY) >> Jun 04, 2019 11:08 AM Erick Blinks wrote: Clinical call back request  [phone] VM okay

## 2019-06-04 NOTE — Patient Outreach (Signed)
[facility]) Care Management  06/04/2019  Tammy Fernandez 13-Sep-1924 [phone]  RN Health Coach  Attempted screening  outreach call to patient. RN spoke with son [name]. He is in a store and will call me back.   [facility] [phone]

## 2019-06-05 ENCOUNTER — Telehealth: Payer: Self-pay | Admitting: Adult Health Nurse Practitioner

## 2019-06-05 NOTE — Telephone Encounter (Signed)
Spoke with son.  Wanted me to contact Dr. Ardis Hughs office for ST referral to determine what type of diet consistency she needs.  Tried calling office a few times and after outgoing message it would just ring without option to leave VM.  Will try calling again after the holiday.  Son also concerned about recent lab work and thought PCP had said patient's CO2 level had gone from 75 to 44.  Had reviewed labs and told him CO2 level did go up to 40 and that she should make sure she wears her CPAP every day.  Have visit scheduled for next week with the patient. Amy K. Olena Heckle NP

## 2019-06-08 ENCOUNTER — Telehealth: Payer: Self-pay | Admitting: Gastroenterology

## 2019-06-08 NOTE — Telephone Encounter (Signed)
[phone] [facility] called would like to know if there is a referral for speech therapy

## 2019-06-09 ENCOUNTER — Other Ambulatory Visit: Payer: Self-pay

## 2019-06-09 ENCOUNTER — Other Ambulatory Visit: Payer: Medicare Other | Admitting: Adult Health Nurse Practitioner

## 2019-06-09 DIAGNOSIS — Z515 Encounter for palliative care: Secondary | ICD-10-CM

## 2019-06-09 NOTE — Progress Notes (Signed)
[facility] Consult Note Telephone: [phone]) [phone]  Fax: [phone]  PATIENT NAME: Tammy Fernandez DOB: [birth year] MRN: [id number]  PRIMARY CARE PROVIDER:   Myles LippsSantiago, Irma M, MD  REFERRING PROVIDER:  Myles LippsSantiago, Irma M, MD [address] [place],  [place] [id number]  RESPONSIBLE PARTY:   Mare FerrariJerome Erlandson, son [phone]     RECOMMENDATIONS and PLAN:  1.  Allergies.  Patient still having a lot of congestion related to allergies causing fluid build up in her ears causing otalgia.  She states that her ears have not been hurting as much.  Patient has not tried her Claritin D because she is concerned of her BP going up.  Did educate her that it will raise her BP but not to dangerous levels but encouraged her to monitor her BP and if it goes too high then she does not have to take another dose. If she can tolerate it did educate her not to take it for more than 3 days.  Concerned that she has had this fluid build up for several weeks and do not want it to cause URI or pneumonia.  Her lung sounds are at baseline, which is diminished but no abnormal sounds heard. She does have a cough mainly at night or in the morning.  Believe this may be related to reflux as well as allergy drainage.  The congestion and fluid and build up seem to come and go but she does have complaints of otalgia and headaches when it is really bad.  Hoping she will feel better if she can dry up some of the congestion.  2. Reflux.  She still has reflux symptoms at night and feel like coughing at night and early morning are related to reflux because she states that it tastes like acid.  She currently has been taking prilosec 20 mg BID.  Have encouraged her to take 40 mg at night to help with the reflux and cough at night.  Recently has had barium esophagram done and was slightly improved from the one in January 2020.  Recommendations for solid food and thin liquids, can use cup or straw for  drinking, sit straight up while eating and for a short period after eating.  They are wanting ST to come and evaluate.  Talked with Tresa EndoKelly at Dr. Christella HartiganJacobs, GI doctor, office and states that the patient and son were educated on this and is unsure why they are wanting ST.  She said she would call the son and clarify the instructions.      I spent 60 minutes providing this consultation,  from 2:00 to 3:00. More than 50% of the time in this consultation was spent coordinating communication.   HISTORY OF PRESENT ILLNESS:  Tammy Fernandez is a 85 y.o. year old female with multiple medical problems including pulmonary hypertension, COPD, OSA, SIADH. Palliative Care was asked to help address goals of care.   CODE STATUS: Full code  PPS: 50% HOSPICE ELIGIBILITY/DIAGNOSIS: TBD  PHYSICAL EXAM:   General:patient sittingon couchat home in NAD Cardiovascular: regular rate and rhythm. HR58 Pulmonary:lung sounds diminished but no abnormal breath sounds heard, normal respiratory effort. O2 sat 98% on 2.5L  Abdomen: soft, nontender, + bowel sounds GU: no suprapubic tenderness Extremities:traceedema tobilateral feet, no joint deformities Neurological: Weakness but otherwise nonfocal    PAST MEDICAL HISTORY:  Past Medical History:  Diagnosis Date  . Anxiety   . Chronic lower back pain   . Constipation  h/o fecal disimpaction on 2017  . CVA (cerebral vascular accident) ([facility]) 2015   neg workup, MRI small acute lacunar infarcts. ASA only  . Depression   . Diastolic CHF, chronic (HCC)    grade I, most recent echo Jan 2019  . Fatty liver   . GERD (gastroesophageal reflux disease)   . Hyperlipidemia   . Hypertension   . Hyponatremia    multiple episodes with encephalopathy, renal thinks SIADH  . Hypothyroidism, unspecified   . Internal hemorrhoid   . NSTEMI (non-ST elevated myocardial infarction) ([facility]) 08/28/2016  . Palpitations   . PFO with atrial septal aneurysm    TTE 2015  .  Pneumonia 2016  . Severe pulmonary arterial systolic hypertension ([facility]) 12/25/2017   per echo, on 1L oxgen via     SOCIAL HX:  Social History   Tobacco Use  . Smoking status: Never Smoker  . Smokeless tobacco: Former Systems developer    Types: Snuff  . Tobacco comment: "used snuff when I was 19"  Substance Use Topics  . Alcohol use: No    ALLERGIES:  Allergies  Allergen Reactions  . Crestor [Rosuvastatin Calcium] Other (See Comments)    Muscle Aches  . Keflex [Cephalexin] Other (See Comments)    dizziness  . Peanut Butter Flavor Hives  . Sulfa Antibiotics Hives  . Cephalexin Other (See Comments)    dizziness  . Sulfa Drugs Cross Reactors Rash     PERTINENT MEDICATIONS:  Outpatient Encounter Medications as of 06/09/2019  Medication Sig  . albuterol (PROVENTIL) (2.5 MG/3ML) 0.083% nebulizer solution Take 3 mLs (2.5 mg total) by nebulization every 4 (four) hours as needed for wheezing or shortness of breath.  . ALPRAZolam (XANAX) 1 MG tablet Take 1 tablet (1 mg total) by mouth 2 (two) times daily as needed for anxiety.  Marland Kitchen amLODipine (NORVASC) 10 MG tablet Take 1 tablet (10 mg total) by mouth daily. (Patient taking differently: Take 5 mg by mouth daily. )  . Aspirin 81 MG EC tablet Take 81 mg by mouth daily.  . cetirizine (ZYRTEC) 10 MG tablet Take 10 mg by mouth daily.  Marland Kitchen demeclocycline (DECLOMYCIN) 300 MG tablet Take 150 mg by mouth 2 (two) times a day.  . diclofenac sodium (VOLTAREN) 1 % GEL Apply 2 g topically 4 (four) times daily.  Marland Kitchen docusate sodium (COLACE) 100 MG capsule Take 100 mg by mouth daily as needed for mild constipation.  . fluticasone (FLONASE) 50 MCG/ACT nasal spray Place 1 spray into both nostrils 2 (two) times daily.  Marland Kitchen glycerin adult 2 g suppository Place 1 suppository rectally as needed for constipation.  Marland Kitchen levothyroxine (SYNTHROID) 50 MCG tablet Take 1 tablet (50 mcg total) by mouth daily before breakfast.  . meclizine (ANTIVERT) 12.5 MG tablet Take 1 tablet (12.5 mg  total) by mouth 3 (three) times daily as needed for dizziness.  . mometasone (NASONEX) 50 MCG/ACT nasal spray Place 2 sprays into the nose daily.  Marland Kitchen nystatin cream (MYCOSTATIN) Apply 1 application topically 2 (two) times daily.  Marland Kitchen olopatadine (PATANOL) 0.1 % ophthalmic solution Place 1 drop into both eyes daily as needed for allergies.  Marland Kitchen omeprazole (PRILOSEC) 20 MG capsule Take 1 capsule (20 mg total) by mouth 2 (two) times daily before a meal.  . propranolol ER (INDERAL LA) 60 MG 24 hr capsule Take 1 capsule (60 mg total) by mouth daily.   No facility-administered encounter medications on file as of 06/09/2019.     PHYSICAL EXAM:   General:  NAD, frail appearing, thin Cardiovascular: regular rate and rhythm Pulmonary: clear ant fields Abdomen: soft, nontender, + bowel sounds GU: no suprapubic tenderness Extremities: no edema, no joint deformities Skin: no rashes Neurological: Weakness but otherwise nonfocal  Keelon Zurn Marlena ClipperK Jeydi Klingel, NP

## 2019-06-09 NOTE — Telephone Encounter (Signed)
lmom for Amy to call back

## 2019-06-10 ENCOUNTER — Telehealth: Payer: Self-pay

## 2019-06-10 ENCOUNTER — Other Ambulatory Visit: Payer: Self-pay | Admitting: Gastroenterology

## 2019-06-10 DIAGNOSIS — R1310 Dysphagia, unspecified: Secondary | ICD-10-CM

## 2019-06-10 NOTE — Telephone Encounter (Signed)
Patient's son called in regards to Tammy Fernandez having skilled speech therapy. After reviewing patients MBS result note,Dr Ardis Hughs recommends her to have skilled speech therapy visit to reiterate diet and swallowing. The son would like in home visits for this service. I contacted [facility] as patient has used this company before. Spoke with Dannielle Huh who is the referral coordinator.All necessary papers faxed over, Dannielle Huh states he received all information on his end and will contact the son to set up an evaluation.

## 2019-06-14 ENCOUNTER — Other Ambulatory Visit: Payer: Self-pay | Admitting: Family Medicine

## 2019-06-14 NOTE — Telephone Encounter (Signed)
Requested medication (s) are due for refill today: yes  Requested medication (s) are on the active medication list: yes  Last refill:  02/20/19  Future visit scheduled: yes  Notes to clinic:  Medication not delegated to NT to refill   Requested Prescriptions  Pending Prescriptions Disp Refills   ALPRAZolam (XANAX) 1 MG tablet [Pharmacy Med Name: ALPRAZOLAM 1MG  TABLETS] 60 tablet     Sig: TAKE 1 TABLET(1 MG) BY MOUTH TWICE DAILY AS NEEDED FOR ANXIETY     Not Delegated - Psychiatry:  Anxiolytics/Hypnotics Failed - 06/14/2019 12:28 PM      Failed - This refill cannot be delegated      Failed - Urine Drug Screen completed in last 360 days.      Passed - Valid encounter within last 6 months    Recent Outpatient Visits          1 week ago SIADH (syndrome of inappropriate ADH production) ([place] [facility])   Primary Care at [facility], Arlie Solomons, MD   1 month ago Hyponatremia   [facility] at [facility], Lilia Argue, MD   4 months ago Hyponatremia   [facility] at [facility], Lilia Argue, MD   5 months ago At high risk for falls   Primary Care at [facility], Arlie Solomons, MD   6 months ago Cough   [facility] at [facility], Lilia Argue, MD      Future Appointments            In 1 week Rutherford Guys, MD Primary Care at [facility], [facility]   In 1 month Gwenlyn Found, Pearletha Forge, MD [facility], [facility]

## 2019-06-15 ENCOUNTER — Other Ambulatory Visit: Payer: Self-pay | Admitting: *Deleted

## 2019-06-15 ENCOUNTER — Other Ambulatory Visit: Payer: Self-pay

## 2019-06-15 ENCOUNTER — Other Ambulatory Visit: Payer: Medicare Other | Admitting: Adult Health Nurse Practitioner

## 2019-06-15 DIAGNOSIS — Z515 Encounter for palliative care: Secondary | ICD-10-CM | POA: Diagnosis not present

## 2019-06-15 NOTE — Telephone Encounter (Signed)
Please Advise  Patient is requesting a refill of the following medications: Requested Prescriptions   Pending Prescriptions Disp Refills  . ALPRAZolam (XANAX) 1 MG tablet [Pharmacy Med Name: ALPRAZOLAM 1MG  TABLETS] 60 tablet     Sig: TAKE 1 TABLET(1 MG) BY MOUTH TWICE DAILY AS NEEDED FOR ANXIETY    Date of patient request: 06/14/19 Last office visit: 06/02/19 Date of last refill: 02/20/19 Last refill amount: 60 tab, 3 refills Follow up time period per chart: 06/26/19

## 2019-06-15 NOTE — Progress Notes (Signed)
[facility] Consult Note Telephone: [phone]  Fax: [phone]  PATIENT NAME: Tammy Fernandez DOB: 01/21/1924 MRN: [id number]  PRIMARY CARE PROVIDER:   Rutherford Guys, MD  REFERRING PROVIDER:  Rutherford Guys, MD [address] [place],  [place] [address]  RESPONSIBLE PARTY:   Jaycie Kregel, son [phone]  Due to the COVID-19 crisis, this visit was done via telemedicine and it was initiated and consent by this patient and or family. Video-audio (telehealth) contact was unable to be done due to technical barriers from the patient's side.    RECOMMENDATIONS and PLAN:  1.  Cough.  Patient asking if alright to wear a surgical mask or face covering in the house as she states that when the [facility] comes on she starts coughing.  Educated her that it might help but with her COPD and increase CO2 levels that she should take the mask off every few minutes to get fresh air.  Discussed the benefits of pursed lip breathing.  Did talk with son who states that when she does where the mask that her O2 levels will go down and when she takes the mask off they go back up to normal.  Discussed with son using the mask as little as possible and keep monitoring her O2 levels when she is wearing the mask.  He did state that she will get a wet cough sometimes at night and with the CPAP mask on she won't swallow her spit and it accumulates in her mouth and he is concerned about her aspirating.  Believe the intermittent extra secretions are related to her allergies and suggest propping her head up on a couple pillows at night to help with the congestion she gets at night.    2.  Depression/Anxiety.  Patient states that she is having increased depression since her granddaughter passed away last month.  States that she doesn't want to get out of bed some days but she forces herself to get up.  States that the anxiety has been ongoing for several months and she takes half of a  1mg  xanax in the morning and half at night.  States she does not like to take more than that because it makes her sleepy.  Recommend celexa or zoloft that might help with both the depression and the anxiety.  Will email Dr. Pamella Pert.    I spent 25 minutes providing this consultation,  from 4:00 to 4:25. More than 50% of the time in this consultation was spent coordinating communication.   HISTORY OF PRESENT ILLNESS:  KOMAL STANGELO is a 85 y.o. year old female with multiple medical problems including pulmonary hypertension, COPD, OSA, SIADH. Palliative Care was asked to help address goals of care.   CODE STATUS: Full Code  PPS: 50% HOSPICE ELIGIBILITY/DIAGNOSIS: TBD  PAST MEDICAL HISTORY:  Past Medical History:  Diagnosis Date  . Anxiety   . Chronic lower back pain   . Constipation    h/o fecal disimpaction on 2017  . CVA (cerebral vascular accident) ([place]) 2015   neg workup, MRI small acute lacunar infarcts. ASA only  . Depression   . Diastolic CHF, chronic (HCC)    grade I, most recent echo Jan 2019  . Fatty liver   . GERD (gastroesophageal reflux disease)   . Hyperlipidemia   . Hypertension   . Hyponatremia    multiple episodes with encephalopathy, renal thinks SIADH  . Hypothyroidism, unspecified   . Internal hemorrhoid   .  NSTEMI (non-ST elevated myocardial infarction) (HCC) 08/28/2016  . Palpitations   . PFO with atrial septal aneurysm    TTE 2015  . Pneumonia 2016  . Severe pulmonary arterial systolic hypertension (HCC) 12/25/2017   per echo, on 1L oxgen via [place]    SOCIAL HX:  Social History   Tobacco Use  . Smoking status: Never Smoker  . Smokeless tobacco: Former NeurosurgeonUser    Types: Snuff  . Tobacco comment: "used snuff when I was 19"  Substance Use Topics  . Alcohol use: No    ALLERGIES:  Allergies  Allergen Reactions  . Crestor [Rosuvastatin Calcium] Other (See Comments)    Muscle Aches  . Keflex [Cephalexin] Other (See Comments)    dizziness  . Peanut  Butter Flavor Hives  . Sulfa Antibiotics Hives  . Cephalexin Other (See Comments)    dizziness  . Sulfa Drugs Cross Reactors Rash     PERTINENT MEDICATIONS:  Outpatient Encounter Medications as of 06/15/2019  Medication Sig  . albuterol (PROVENTIL) (2.5 MG/3ML) 0.083% nebulizer solution Take 3 mLs (2.5 mg total) by nebulization every 4 (four) hours as needed for wheezing or shortness of breath.  . ALPRAZolam (XANAX) 1 MG tablet TAKE 1 TABLET(1 MG) BY MOUTH TWICE DAILY AS NEEDED FOR ANXIETY  . amLODipine (NORVASC) 10 MG tablet Take 1 tablet (10 mg total) by mouth daily. (Patient taking differently: Take 5 mg by mouth daily. )  . Aspirin 81 MG EC tablet Take 81 mg by mouth daily.  . cetirizine (ZYRTEC) 10 MG tablet Take 10 mg by mouth daily.  Marland Kitchen. demeclocycline (DECLOMYCIN) 300 MG tablet Take 150 mg by mouth 2 (two) times a day.  . diclofenac sodium (VOLTAREN) 1 % GEL Apply 2 g topically 4 (four) times daily.  Marland Kitchen. docusate sodium (COLACE) 100 MG capsule Take 100 mg by mouth daily as needed for mild constipation.  . fluticasone (FLONASE) 50 MCG/ACT nasal spray Place 1 spray into both nostrils 2 (two) times daily.  Marland Kitchen. glycerin adult 2 g suppository Place 1 suppository rectally as needed for constipation.  Marland Kitchen. levothyroxine (SYNTHROID) 50 MCG tablet Take 1 tablet (50 mcg total) by mouth daily before breakfast.  . meclizine (ANTIVERT) 12.5 MG tablet Take 1 tablet (12.5 mg total) by mouth 3 (three) times daily as needed for dizziness.  . mometasone (NASONEX) 50 MCG/ACT nasal spray Place 2 sprays into the nose daily.  Marland Kitchen. nystatin cream (MYCOSTATIN) Apply 1 application topically 2 (two) times daily.  Marland Kitchen. olopatadine (PATANOL) 0.1 % ophthalmic solution Place 1 drop into both eyes daily as needed for allergies.  Marland Kitchen. omeprazole (PRILOSEC) 20 MG capsule Take 1 capsule (20 mg total) by mouth 2 (two) times daily before a meal.  . propranolol ER (INDERAL LA) 60 MG 24 hr capsule Take 1 capsule (60 mg total) by mouth  daily.   No facility-administered encounter medications on file as of 06/15/2019.       Marlena ClipperK , NP

## 2019-06-15 NOTE — Telephone Encounter (Signed)
pmp reviewd, appropriate meds refilled 

## 2019-06-15 NOTE — Patient Outreach (Signed)
[facility]) Care Management  06/15/2019  Tammy Fernandez Apr 25, 1924 [id number]   RN Health Coach attempted follow up outreachscreening call to patient son Neilah Fulwider.  He was unavailable. HIPPA compliance voicemail message left with return callback number.  Plan: RN will call patient again within 10 days.  [facility] Care Management [phone]

## 2019-06-17 ENCOUNTER — Ambulatory Visit: Payer: Self-pay | Admitting: *Deleted

## 2019-06-23 ENCOUNTER — Other Ambulatory Visit: Payer: Self-pay

## 2019-06-23 ENCOUNTER — Other Ambulatory Visit: Payer: Medicare Other | Admitting: Adult Health Nurse Practitioner

## 2019-06-23 DIAGNOSIS — Z515 Encounter for palliative care: Secondary | ICD-10-CM

## 2019-06-23 NOTE — Progress Notes (Signed)
[facility] Consult Note Telephone: [phone]  Fax: [phone]  PATIENT NAME: Tammy Fernandez DOB: 07-Aug-1924 MRN: [id number]  PRIMARY CARE PROVIDER:   Myles LippsSantiago, Irma M, MD  REFERRING PROVIDER:  Myles LippsSantiago, Irma M, MD [address] [place] [id number]  RESPONSIBLE PARTY:   Mare FerrariJerome Peckenpaugh, son [phone]    RECOMMENDATIONS and PLAN:  1.  Depression.  Last week patient called me and was concerned about her depression.  Coordinated with Dr. Leretha PolSantiago and ordered sertraline 25 mg daily.  Her son picked up the medication for her but she is now refusing to take it.  States that she is feeling better and does not want to have any of the side effects of it.  States that when she gets up night that she prays and feels better.  Told that it was up to her and that if she did get worse she had the medicine to try.    2.  Allergies.  Patient is still having intermittent congestion and cough related to allergies.  It is worse at night when she is lying down.  Have suggested that she try an allergy med with a decongestant but she does not want to try it because it could raise her BP.  Suggested she try a different allergy to see if it gave her better relief.  She does state that she has been on Flonase and Zyrtec for a few years.  Told her that sometimes we get used to allergy medicine and have to switch.  She states that she will try this.  3.  Yeast infection.  Patient was treated for a yeast infection a few weeks back.  She states that it never really went away and that it is starting to get worse again.  States that she tried creams like Monistat with no relief.  Will reach out to PCP for possible Diflucan prescription.  She does have appointment with PCP on 06/26/2019.  I spent 60 minutes providing this consultation,  from 2:00 to 3:00. More than 50% of the time in this consultation was spent coordinating communication.   HISTORY OF PRESENT ILLNESS:   Tammy BeachRosa L Cando is a [age] y.o. year old female with multiple medical problems including pulmonary hypertension, COPD, OSA, SIADH. Palliative Care was asked to help address goals of care.   CODE STATUS: Full Code  PPS: 50% HOSPICE ELIGIBILITY/DIAGNOSIS: TBD  PHYSICAL EXAM:   General: NAD, frail appearing, thin Cardiovascular: regular rate and rhythm Pulmonary: clear ant fields Abdomen: soft, nontender, + bowel sounds GU: no suprapubic tenderness Extremities: no edema, no joint deformities Skin: no rashes Neurological: Weakness but otherwise nonfocal   PAST MEDICAL HISTORY:  Past Medical History:  Diagnosis Date   Anxiety    Chronic lower back pain    Constipation    h/o fecal disimpaction on 2017   CVA (cerebral vascular accident) (HCC) 2015   neg workup, MRI small acute lacunar infarcts. ASA only   Depression    Diastolic CHF, chronic (HCC)    grade I, most recent echo Jan 2019   Fatty liver    GERD (gastroesophageal reflux disease)    Hyperlipidemia    Hypertension    Hyponatremia    multiple episodes with encephalopathy, renal thinks SIADH   Hypothyroidism, unspecified    Internal hemorrhoid    NSTEMI (non-ST elevated myocardial infarction) (HCC) 08/28/2016   Palpitations    PFO with atrial septal aneurysm    TTE 2015  Pneumonia 2016   Severe pulmonary arterial systolic hypertension ([place]) 12/25/2017   per echo, on 1L oxgen via [place]    SOCIAL HX:  Social History   Tobacco Use   Smoking status: Never Smoker   Smokeless tobacco: Former Systems developer    Types: Snuff   Tobacco comment: "used snuff when I was 19"  Substance Use Topics   Alcohol use: No    ALLERGIES:  Allergies  Allergen Reactions   Crestor [Rosuvastatin Calcium] Other (See Comments)    Muscle Aches   Keflex [Cephalexin] Other (See Comments)    dizziness   Peanut Butter Flavor Hives   Sulfa Antibiotics Hives   Cephalexin Other (See Comments)    dizziness   Sulfa Drugs  Cross Reactors Rash     PERTINENT MEDICATIONS:  Outpatient Encounter Medications as of 06/23/2019  Medication Sig   albuterol (PROVENTIL) (2.5 MG/3ML) 0.083% nebulizer solution Take 3 mLs (2.5 mg total) by nebulization every 4 (four) hours as needed for wheezing or shortness of breath.   ALPRAZolam (XANAX) 1 MG tablet TAKE 1 TABLET(1 MG) BY MOUTH TWICE DAILY AS NEEDED FOR ANXIETY   amLODipine (NORVASC) 10 MG tablet Take 1 tablet (10 mg total) by mouth daily. (Patient taking differently: Take 5 mg by mouth daily. )   Aspirin 81 MG EC tablet Take 81 mg by mouth daily.   cetirizine (ZYRTEC) 10 MG tablet Take 10 mg by mouth daily.   demeclocycline (DECLOMYCIN) 300 MG tablet Take 150 mg by mouth 2 (two) times a day.   diclofenac sodium (VOLTAREN) 1 % GEL Apply 2 g topically 4 (four) times daily.   docusate sodium (COLACE) 100 MG capsule Take 100 mg by mouth daily as needed for mild constipation.   fluticasone (FLONASE) 50 MCG/ACT nasal spray Place 1 spray into both nostrils 2 (two) times daily.   glycerin adult 2 g suppository Place 1 suppository rectally as needed for constipation.   levothyroxine (SYNTHROID) 50 MCG tablet Take 1 tablet (50 mcg total) by mouth daily before breakfast.   meclizine (ANTIVERT) 12.5 MG tablet Take 1 tablet (12.5 mg total) by mouth 3 (three) times daily as needed for dizziness.   mometasone (NASONEX) 50 MCG/ACT nasal spray Place 2 sprays into the nose daily.   nystatin cream (MYCOSTATIN) Apply 1 application topically 2 (two) times daily.   olopatadine (PATANOL) 0.1 % ophthalmic solution Place 1 drop into both eyes daily as needed for allergies.   omeprazole (PRILOSEC) 20 MG capsule Take 1 capsule (20 mg total) by mouth 2 (two) times daily before a meal.   propranolol ER (INDERAL LA) 60 MG 24 hr capsule Take 1 capsule (60 mg total) by mouth daily.   No facility-administered encounter medications on file as of 06/23/2019.      Yoav Okane Jenetta Downer, NP

## 2019-06-24 ENCOUNTER — Telehealth: Payer: Self-pay | Admitting: Family Medicine

## 2019-06-24 DIAGNOSIS — B3731 Acute candidiasis of vulva and vagina: Secondary | ICD-10-CM

## 2019-06-24 DIAGNOSIS — B373 Candidiasis of vulva and vagina: Secondary | ICD-10-CM

## 2019-06-24 NOTE — Telephone Encounter (Signed)
Dr Pamella Pert this patient states they are having yeast infection can we call in med for this or do I need to call patient back and let them know they need to be seen

## 2019-06-24 NOTE — Telephone Encounter (Signed)
Stating pt was complaining of a yeast infection and want to know if she can be prescribe something. Please advise

## 2019-06-24 NOTE — Telephone Encounter (Signed)
I think that at this point she needs to see a gynecologist. Is she ok if I make a referral? Thanks

## 2019-06-26 ENCOUNTER — Ambulatory Visit: Payer: Medicare Other | Admitting: Family Medicine

## 2019-06-26 NOTE — Telephone Encounter (Signed)
done

## 2019-06-26 NOTE — Telephone Encounter (Signed)
When patient return call pls let her know below msg from Dr Layla Maw

## 2019-06-26 NOTE — Telephone Encounter (Signed)
Patient son [name] called back message was relayed and he say that it is ok for Dr Pamella Pert to make the referral and then contact him with the details. Ph# [phone]

## 2019-06-26 NOTE — Telephone Encounter (Signed)
Patient is ok with the referral.

## 2019-06-30 ENCOUNTER — Telehealth: Payer: Self-pay | Admitting: Adult Health Nurse Practitioner

## 2019-06-30 NOTE — Telephone Encounter (Signed)
Returning call from patient.  She is concerned about vaginal yeast infection.  Had called in to office of PCP last week to see if PCP wanted to order Diflucan.  She has been using monistat cream and vaseline at home with some relief but has been going on for about a month.  I encouraged her to call the office and set up an appointment to make sure there isn't anything else going on that needs to be addressed Tammy Fernandez K. Olena Heckle NP

## 2019-07-02 DIAGNOSIS — B9562 Methicillin resistant Staphylococcus aureus infection as the cause of diseases classified elsewhere: Secondary | ICD-10-CM | POA: Diagnosis not present

## 2019-07-02 DIAGNOSIS — J9601 Acute respiratory failure with hypoxia: Secondary | ICD-10-CM | POA: Diagnosis not present

## 2019-07-02 DIAGNOSIS — I6789 Other cerebrovascular disease: Secondary | ICD-10-CM | POA: Diagnosis not present

## 2019-07-02 DIAGNOSIS — R7881 Bacteremia: Secondary | ICD-10-CM | POA: Diagnosis not present

## 2019-07-06 ENCOUNTER — Telehealth: Payer: Self-pay | Admitting: Obstetrics & Gynecology

## 2019-07-06 NOTE — Telephone Encounter (Signed)
Called and spoke to her son about her upcoming appt for Thursday 07-09-2019

## 2019-07-07 ENCOUNTER — Other Ambulatory Visit: Payer: Medicare Other | Admitting: Adult Health Nurse Practitioner

## 2019-07-07 ENCOUNTER — Other Ambulatory Visit: Payer: Self-pay

## 2019-07-07 DIAGNOSIS — Z515 Encounter for palliative care: Secondary | ICD-10-CM

## 2019-07-07 NOTE — Progress Notes (Signed)
[facility], [facility] Consult Note Telephone: [phone]  Fax: [phone]  PATIENT NAME: Tammy Fernandez DOB: 1924/02/02 MRN: [id number]  PRIMARY CARE PROVIDER:   Myles LippsSantiago, Irma M, MD  REFERRING PROVIDER:  Myles LippsSantiago, Irma M, MD [address] [place],  [place] [id number]  RESPONSIBLE PARTY:   Mare FerrariJerome Beharry, son [phone]     RECOMMENDATIONS and PLAN:  1.  Depression.  States that her depression is getting better. Did not try the zoloft.  States that she prays when she thinks about her late granddaughter.  Denies SI.  Continue to use spiritual support when missing her late loved ones.  2.  Allergies.  Her allergies have not been as bad lately and has not had the wet productive cough.  Did state having some wheezing this morning which is relieved with her neb treatments.  Also states that her allergies aren't as bad when she keeps the [facility] from blowing on her directly.  Continue current allergy medicine and supportive measures that lessen her symptoms  3.  Swelling under left eye.  Patient has been having some swelling under left eye.  Today there is slight swelling noted under left eye.  Patient thinks that it is from her CPAP mask.  Instructed to monitor to make sure it is not being put on too tight.  Could be allergies but patient denies ever having eye swelling associated with her allergies.  Denies itching or pain.  Continue to monitor for when swelling occurs to pinpoint what could be causing it.  4.  Yeast infection.  States that it still itches "down there" but not as bad.  Continues to use monistat and vaseline for relief.  Has OB/GYN appointment on Thursday.  Follow GYN recommendations after evaluation.  5.  Constipation.  Patient is getting relief of constipation with using Miralax 2-3 times per week.  Continue this.  5.  Goals of care.  Patient denies any falls, infections.  No appetite or weight changes.  States that she is not having  dysphagia.  She continues to drink water with her food to help as suggested by ST.    I spent 40 minutes providing this consultation,  from 2:00 to 2:40. More than 50% of the time in this consultation was spent coordinating communication.   HISTORY OF PRESENT ILLNESS:  Tammy Fernandez is a 85 y.o. year old female with multiple medical problems including pulmonary hypertension, COPD, OSA, SIADH. Palliative Care was asked to help address goals of care.   CODE STATUS: Full Code  PPS: 50% HOSPICE ELIGIBILITY/DIAGNOSIS: TBD  PHYSICAL EXAM:  BP 130/58 HR 59 O2 sat 99% General: NAD, frail appearing,  Cardiovascular: regular rate and rhythm Pulmonary: lung sounds diminished but no abnormal breath sounds heard; normal respiratory effort Extremities: no edema noted today; no joint deformities Neurological: Weakness but otherwise nonfocal; some forgetfulness  PAST MEDICAL HISTORY:  Past Medical History:  Diagnosis Date  . Anxiety   . Chronic lower back pain   . Constipation    h/o fecal disimpaction on 2017  . CVA (cerebral vascular accident) (HCC) 2015   neg workup, MRI small acute lacunar infarcts. ASA only  . Depression   . Diastolic CHF, chronic (HCC)    grade I, most recent echo Jan 2019  . Fatty liver   . GERD (gastroesophageal reflux disease)   . Hyperlipidemia   . Hypertension   . Hyponatremia    multiple episodes with encephalopathy, renal thinks SIADH  . Hypothyroidism,  unspecified   . Internal hemorrhoid   . NSTEMI (non-ST elevated myocardial infarction) ([place]) 08/28/2016  . Palpitations   . PFO with atrial septal aneurysm    TTE 2015  . Pneumonia 2016  . Severe pulmonary arterial systolic hypertension ([place]) 12/25/2017   per echo, on 1L oxgen via [place]    SOCIAL HX:  Social History   Tobacco Use  . Smoking status: Never Smoker  . Smokeless tobacco: Former Systems developer    Types: Snuff  . Tobacco comment: "used snuff when I was 19"  Substance Use Topics  . Alcohol use: No     ALLERGIES:  Allergies  Allergen Reactions  . Crestor [Rosuvastatin Calcium] Other (See Comments)    Muscle Aches  . Keflex [Cephalexin] Other (See Comments)    dizziness  . Peanut Butter Flavor Hives  . Sulfa Antibiotics Hives  . Cephalexin Other (See Comments)    dizziness  . Sulfa Drugs Cross Reactors Rash     PERTINENT MEDICATIONS:  Outpatient Encounter Medications as of 07/07/2019  Medication Sig  . albuterol (PROVENTIL) (2.5 MG/3ML) 0.083% nebulizer solution Take 3 mLs (2.5 mg total) by nebulization every 4 (four) hours as needed for wheezing or shortness of breath.  . ALPRAZolam (XANAX) 1 MG tablet TAKE 1 TABLET(1 MG) BY MOUTH TWICE DAILY AS NEEDED FOR ANXIETY  . amLODipine (NORVASC) 10 MG tablet Take 1 tablet (10 mg total) by mouth daily. (Patient taking differently: Take 5 mg by mouth daily. )  . Aspirin 81 MG EC tablet Take 81 mg by mouth daily.  . cetirizine (ZYRTEC) 10 MG tablet Take 10 mg by mouth daily.  Marland Kitchen demeclocycline (DECLOMYCIN) 300 MG tablet Take 150 mg by mouth 2 (two) times a day.  . diclofenac sodium (VOLTAREN) 1 % GEL Apply 2 g topically 4 (four) times daily.  Marland Kitchen docusate sodium (COLACE) 100 MG capsule Take 100 mg by mouth daily as needed for mild constipation.  . fluticasone (FLONASE) 50 MCG/ACT nasal spray Place 1 spray into both nostrils 2 (two) times daily.  Marland Kitchen glycerin adult 2 g suppository Place 1 suppository rectally as needed for constipation.  Marland Kitchen levothyroxine (SYNTHROID) 50 MCG tablet Take 1 tablet (50 mcg total) by mouth daily before breakfast.  . meclizine (ANTIVERT) 12.5 MG tablet Take 1 tablet (12.5 mg total) by mouth 3 (three) times daily as needed for dizziness.  . mometasone (NASONEX) 50 MCG/ACT nasal spray Place 2 sprays into the nose daily.  Marland Kitchen nystatin cream (MYCOSTATIN) Apply 1 application topically 2 (two) times daily.  Marland Kitchen olopatadine (PATANOL) 0.1 % ophthalmic solution Place 1 drop into both eyes daily as needed for allergies.  Marland Kitchen omeprazole  (PRILOSEC) 20 MG capsule Take 1 capsule (20 mg total) by mouth 2 (two) times daily before a meal.  . propranolol ER (INDERAL LA) 60 MG 24 hr capsule Take 1 capsule (60 mg total) by mouth daily.   No facility-administered encounter medications on file as of 07/07/2019.       Hardie Veltre Jenetta Downer, NP

## 2019-07-09 ENCOUNTER — Other Ambulatory Visit: Payer: Self-pay

## 2019-07-09 ENCOUNTER — Ambulatory Visit (INDEPENDENT_AMBULATORY_CARE_PROVIDER_SITE_OTHER): Payer: Medicare Other | Admitting: Family Medicine

## 2019-07-09 ENCOUNTER — Encounter: Payer: Self-pay | Admitting: Family Medicine

## 2019-07-09 ENCOUNTER — Encounter: Payer: Medicare Other | Admitting: Obstetrics & Gynecology

## 2019-07-09 VITALS — BP 132/52 | HR 63 | Temp 97.9°F | Ht 62.0 in

## 2019-07-09 DIAGNOSIS — J9611 Chronic respiratory failure with hypoxia: Secondary | ICD-10-CM

## 2019-07-09 DIAGNOSIS — J9612 Chronic respiratory failure with hypercapnia: Secondary | ICD-10-CM

## 2019-07-09 DIAGNOSIS — E222 Syndrome of inappropriate secretion of antidiuretic hormone: Secondary | ICD-10-CM | POA: Diagnosis not present

## 2019-07-09 DIAGNOSIS — E871 Hypo-osmolality and hyponatremia: Secondary | ICD-10-CM | POA: Diagnosis not present

## 2019-07-09 DIAGNOSIS — I5032 Chronic diastolic (congestive) heart failure: Secondary | ICD-10-CM | POA: Diagnosis not present

## 2019-07-09 DIAGNOSIS — I2721 Secondary pulmonary arterial hypertension: Secondary | ICD-10-CM

## 2019-07-09 LAB — BASIC METABOLIC PANEL
BUN/Creatinine Ratio: 15 (ref 12–28)
BUN: 10 mg/dL (ref 10–36)
CO2: 35 mmol/L — ABNORMAL HIGH (ref 20–29)
Calcium: 9.8 mg/dL (ref 8.7–10.3)
Chloride: 85 mmol/L — ABNORMAL LOW (ref 96–106)
Creatinine, Ser: 0.67 mg/dL (ref 0.57–1.00)
GFR calc Af Amer: 88 mL/min/{1.73_m2} (ref >59)
GFR calc non Af Amer: 77 mL/min/{1.73_m2} (ref >59)
Glucose: 136 mg/dL — ABNORMAL HIGH (ref 65–99)
Potassium: 4.4 mmol/L (ref 3.5–5.2)
Sodium: 132 mmol/L — ABNORMAL LOW (ref 134–144)

## 2019-07-09 NOTE — Patient Instructions (Addendum)
  Get a HEPA filter for your bedroom I will be making a referral to sleep medicine Check bp at home x 2 weeks   If you have lab work done today you will be contacted with your lab results within the next 2 weeks.  If you have not heard from [place] then please contact us. The fastest way to get your results is to register for My Chart.   IF you received an x-ray today, you will receive an invoice from [facility]. Please contact [facility] at [phone] with questions or concerns regarding your invoice.   IF you received labwork today, you will receive an invoice from [facility]. Please contact [facility] at [phone] with questions or concerns regarding your invoice.   Our billing staff will not be able to assist you with questions regarding bills from these companies.  You will be contacted with the lab results as soon as they are available. The fastest way to get your results is to activate your My Chart account. Instructions are located on the last page of this paperwork. If you have not heard from [place] regarding the results in 2 weeks, please contact this office.

## 2019-07-09 NOTE — Progress Notes (Signed)
8/6/202012:15 PM  Tammy Fernandez 23-May-1924, 85 y.o., female [id number]  Chief Complaint  Patient presents with  . Ear Pain    right ear pain, left ear fullness per pt, she will be resheduling the appt for the gyn. pcp and gyn appt sheduale conflict  . Medication Refill    norvasc, declomycin, flonase,prilosec inderal. Asking for 90 day supplies    HPI:   Patient is a 85 y.o. female with past medical history significant for hyponatremia2/2 SIADH, constipation, HTN,dCHF, severe pHTN, chronic respiratory failure on oxygen, CVA, GERD and seasonal allergieswho presents today forroutine followup  Last OV May 2020  Checking BP intermittently only when she feels that her BP might be high They report readings at home ~ 130s  gerd is doing better Sign improvement in choking/coughing when eating Following diet and textures as recommended Gyn appt was resceduled  Did not take sertraline, doing better, grief, well supported  Struggling with uses of NIV/Triology which was started at time of dc from hosp in Jan 2020 for thru advance home care for chronic hypoxic and hypercapnic respiratory failure Discharge recommendation was for outpatient sleep eval but due to covid 19 this did not happen, they are ready for referral now   Per her son, she is on bipap, with pressures of 12/5.  Goes to bed at 4pm and sleeps until about 8-9pm to make sure she at least uses it for 4 hours Afraid of sleeping at night as had episode when she was very difficult to wake up  Lab Results  Component Value Date   CREATININE 0.60 06/02/2019   BUN 6 (L) 06/02/2019   NA 137 06/02/2019   K 4.2 06/02/2019   CL 83 (L) 06/02/2019   CO2 40 (HH) 06/02/2019    Depression screen [facility] 2/9 07/09/2019 06/02/2019 04/30/2019  Decreased Interest 0 0 0  Down, Depressed, Hopeless 0 0 3  PHQ - 2 Score 0 0 3  Altered sleeping - - 0  Tired, decreased energy - - 3  Change in appetite - - 0  Feeling bad or failure about  yourself  - - 0  Trouble concentrating - - 0  Moving slowly or fidgety/restless - - 0  Suicidal thoughts - - 0  PHQ-9 Score - - 6  Difficult doing work/chores - - Not difficult at all  Some recent data might be hidden    Fall Risk  07/09/2019 06/02/2019 04/30/2019 03/30/2019 02/13/2019  Falls in the past year? - 0 1 0 1  Number falls in past yr: - 0 0 0 1  Injury with Fall? - 0 0 0 0  Risk for fall due to : Impaired balance/gait;Impaired mobility;Impaired vision - - - Other (Comment)  Risk for fall due to: Comment - - - - wheel chair  Follow up - - Falls evaluation completed - Falls evaluation completed     Allergies  Allergen Reactions  . Crestor [Rosuvastatin Calcium] Other (See Comments)    Muscle Aches  . Keflex [Cephalexin] Other (See Comments)    dizziness  . Peanut Butter Flavor Hives  . Sulfa Antibiotics Hives  . Cephalexin Other (See Comments)    dizziness  . Sulfa Drugs Cross Reactors Rash    Prior to Admission medications   Medication Sig Start Date End Date Taking? Authorizing Provider  albuterol (PROVENTIL) (2.5 MG/3ML) 0.083% nebulizer solution Take 3 mLs (2.5 mg total) by nebulization every 4 (four) hours as needed for wheezing or shortness of breath. 06/02/19  Yes Collie SiadStallings, Zoe A, MD  ALPRAZolam (XANAX) 1 MG tablet TAKE 1 TABLET(1 MG) BY MOUTH TWICE DAILY AS NEEDED FOR ANXIETY 06/15/19  Yes Myles LippsSantiago, Baani Bober M, MD  amLODipine (NORVASC) 10 MG tablet Take 1 tablet (10 mg total) by mouth daily. Patient taking differently: Take 5 mg by mouth daily.  04/30/19  Yes Myles LippsSantiago, Tracy Kinner M, MD  Aspirin 81 MG EC tablet Take 81 mg by mouth daily.   Yes [provider]  cetirizine (ZYRTEC) 10 MG tablet Take 10 mg by mouth daily.   Yes [provider]  demeclocycline (DECLOMYCIN) 300 MG tablet Take 150 mg by mouth 2 (two) times a day. 03/11/19  Yes [provider]  diclofenac sodium (VOLTAREN) 1 % GEL Apply 2 g topically 4 (four) times daily. 06/02/19  Yes  Stallings, Zoe A, MD  docusate sodium (COLACE) 100 MG capsule Take 100 mg by mouth daily as needed for mild constipation.   Yes [provider]  fluticasone (FLONASE) 50 MCG/ACT nasal spray Place 1 spray into both nostrils 2 (two) times daily. 02/16/19  Yes Myles LippsSantiago, Reagen Goates M, MD  glycerin adult 2 g suppository Place 1 suppository rectally as needed for constipation. 01/27/19  Yes Myles LippsSantiago, Jais Demir M, MD  levothyroxine (SYNTHROID) 50 MCG tablet Take 1 tablet (50 mcg total) by mouth daily before breakfast. 06/04/19  Yes Corum, Minerva FesterLisa L, MD  meclizine (ANTIVERT) 12.5 MG tablet Take 1 tablet (12.5 mg total) by mouth 3 (three) times daily as needed for dizziness. 06/02/19  Yes Stallings, Zoe A, MD  mometasone (NASONEX) 50 MCG/ACT nasal spray Place 2 sprays into the nose daily. 06/02/19  Yes Stallings, Zoe A, MD  nystatin cream (MYCOSTATIN) Apply 1 application topically 2 (two) times daily. 03/03/19  Yes Myles LippsSantiago, Jamilyn Pigeon M, MD  olopatadine (PATANOL) 0.1 % ophthalmic solution Place 1 drop into both eyes daily as needed for allergies. 02/16/19  Yes Myles LippsSantiago, Abhimanyu Cruces M, MD  omeprazole (PRILOSEC) 20 MG capsule Take 1 capsule (20 mg total) by mouth 2 (two) times daily before a meal. 04/03/19  Yes Myles LippsSantiago, Tricia Oaxaca M, MD  propranolol ER (INDERAL LA) 60 MG 24 hr capsule Take 1 capsule (60 mg total) by mouth daily. 02/17/19  Yes Myles LippsSantiago, Darrah Dredge M, MD    Past Medical History:  Diagnosis Date  . Anxiety   . Chronic lower back pain   . Constipation    h/o fecal disimpaction on 2017  . CVA (cerebral vascular accident) (HCC) 2015   neg workup, MRI small acute lacunar infarcts. ASA only  . Depression   . Diastolic CHF, chronic (HCC)    grade I, most recent echo Jan 2019  . Fatty liver   . GERD (gastroesophageal reflux disease)   . Hyperlipidemia   . Hypertension   . Hyponatremia    multiple episodes with encephalopathy, renal thinks SIADH  . Hypothyroidism, unspecified   . Internal hemorrhoid   . NSTEMI (non-ST elevated  myocardial infarction) (HCC) 08/28/2016  . Palpitations   . PFO with atrial septal aneurysm    TTE 2015  . Pneumonia 2016  . Severe pulmonary arterial systolic hypertension (HCC) 12/25/2017   per echo, on 1L oxgen via [place]    Past Surgical History:  Procedure Laterality Date  . ABDOMINAL HYSTERECTOMY    . CATARACT EXTRACTION W/ INTRAOCULAR LENS  IMPLANT, BILATERAL Bilateral   . CESAREAN SECTION    . TEE WITHOUT CARDIOVERSION N/A 01/19/2014   Procedure: TRANSESOPHAGEAL ECHOCARDIOGRAM (TEE);  Surgeon: Laurey Moralealton S McLean, MD;  Location:  [facility];  Service: Cardiovascular;  Laterality: N/A;  dayna /ja  . VAGINAL HYSTERECTOMY      Social History   Tobacco Use  . Smoking status: Never Smoker  . Smokeless tobacco: Former NeurosurgeonUser    Types: Snuff  . Tobacco comment: "used snuff when I was 19"  Substance Use Topics  . Alcohol use: No    Family History  Problem Relation Age of Onset  . Stroke Father   . Hypertension Father   . Colon cancer Cousin   . Colon cancer Other        Aunt  . Diabetes Other        aunt and cousins  . Heart disease Other        cousins  . Healthy Daughter   . Healthy Son     ROS Per hpi  OBJECTIVE:  Today's Vitals   07/09/19 1136 07/09/19 1259  BP: (!) 155/68 (!) 132/52  Pulse: 63   Temp: 97.9 F (36.6 C)   TempSrc: Oral   SpO2: 97%   Height: 5\' 2"  (1.575 m)    Body mass index is 31.09 kg/m.    Physical Exam Vitals signs and nursing note reviewed.  Constitutional:      Appearance: She is well-developed.  HENT:     Head: Normocephalic and atraumatic.     Mouth/Throat:     Pharynx: No oropharyngeal exudate.  Eyes:     General: No scleral icterus.    Conjunctiva/sclera: Conjunctivae normal.     Pupils: Pupils are equal, round, and reactive to light.  Neck:     Musculoskeletal: Neck supple.  Cardiovascular:     Rate and Rhythm: Normal rate and regular rhythm.     Heart sounds: Normal heart sounds. No murmur. No friction rub. No  gallop.   Pulmonary:     Effort: Pulmonary effort is normal.     Breath sounds: Normal breath sounds. No wheezing, rhonchi or rales.     Comments: O2 2L via [place] Musculoskeletal:     Right lower leg: No edema.     Left lower leg: No edema.  Skin:    General: Skin is warm and dry.  Neurological:     Mental Status: She is alert and oriented to person, place, and time.     ASSESSMENT and PLAN  1. Hyponatremia - Basic metabolic panel  2. Chronic respiratory failure with hypoxia and hypercapnia (HCC) - Ambulatory referral to Sleep Studies  3. Chronic diastolic CHF (congestive heart failure) (HCC) - Ambulatory referral to Sleep Studies  4. Severe pulmonary arterial systolic hypertension (HCC) - Ambulatory referral to Sleep Studies  5. SIADH (syndrome of inappropriate ADH production) (HCC) - Ambulatory referral to Sleep Studies   Return in about 4 weeks (around 08/06/2019).    Myles LippsIrma M Santiago, MD [facility] [address] [place], [place] [address] Ph.  [phone] Fax [phone]

## 2019-07-15 ENCOUNTER — Telehealth: Payer: Self-pay

## 2019-07-15 NOTE — Telephone Encounter (Signed)
Message left by son for NP to call. RN called son, [name]. No answer, message left with callback number

## 2019-07-16 ENCOUNTER — Telehealth: Payer: Self-pay

## 2019-07-16 NOTE — Telephone Encounter (Signed)
Received message to return call to patient's son, [name]. Phone call placed to [facility] who reported that patient continues to have burring and itching vaginally. Patient tried monistat 7 but it was not effective. Appt with GYN doctor is scheduled for end of August. [name] shared that patient has taken diflucan before and it has helped.    [name] also reported that patient's BP was elevated in 532'D and 924'Q systolic for about 3 days and patient was complaining of headaches, dizziness and blurred vision. Jerome increased her amlodipine from 5 mg to 10 mg. BP's have been improved with readings to be in the 130's. Patient denies any headaches, blurred vision or dizziness. [name] would like PCP to direct him on what to do if BP becomes too low. Will update Palliative NP and PCP for direction.

## 2019-07-23 ENCOUNTER — Other Ambulatory Visit: Payer: Medicare Other | Admitting: Adult Health Nurse Practitioner

## 2019-07-23 ENCOUNTER — Encounter: Payer: Self-pay | Admitting: *Deleted

## 2019-07-23 DIAGNOSIS — Z515 Encounter for palliative care: Secondary | ICD-10-CM

## 2019-07-23 NOTE — Progress Notes (Signed)
[facility] Consult Note Telephone: [phone]  Fax: [phone]  PATIENT NAME: Tammy [name] DOB: 1924/08/23 MRN: [id number]  PRIMARY CARE PROVIDER:   Myles LippsSantiago, Irma M, MD  REFERRING PROVIDER:  Myles LippsSantiago, Irma M, MD [address] [place] [address]  RESPONSIBLE PARTY: Mare FerrariJerome Lorimer, son [phone]        RECOMMENDATIONS and PLAN:  1.  Advanced care  Planning.  Patient wishes to be full code.  States that she wants everything done for life.  Will continue to have ACP as time allows.  Next appointment in 2 weeks.  2.  Vaginal itching.  Patient has appointment set up with gynecology next week.  Has had ongoing vaginal itching and rash.  Has been treated with diflucan in the past and has been using monistat cream at home.  States that she found Key-E cream at health food store and it has been giving her relief.  Have encouraged her to keep appointment with gynecology.    3.  Hypertension.  Last week her amlodipine was increased to 10 mg daily as she was having increased BP with SBP in the 160s and 170s.  Today her BP is 128/58. Denies headaches, chest pain, heart palpitations, light-headedness.  Does have occasional dizziness but this is chronic. Continue current dose of amlodipine.    4.  Anxiety/Depression.  Son states that she has been having some paranoia, especially when it comes to her health.  She states that she does have some depression. Has not been sleeping well and PCP has already put in a referral for a sleep study. She states that her depression has been worse since her granddaughter passed away and with all the recent news events including the pandemic. Denies SI.  Had been prescribed Zoloft but has been refusing to start this.  Have encouraged her to take this and went over side effects.  Will continue to monitor and son knows to call if anything changes.  5.  Wet cough.  Mainly gets a wet cough at night and early  morning.  Son concerned about aspiration pneumonia.  States that sometimes she coughs when she eats.  She denies this.  Does state that she did get strangled once when drinking water.  Denies fever and lung sounds though diminished are clear.  Does state that she has been out of her allergy medicine and thinks maybe it could be due to her allergies.  She is having increased mucus production and states that this will clear when she uses her neb treatments.  Have discussed that this could be disease progression of her COPD and encouraged her to use her neb treatments daily, especially at night to clear her lungs before bed time.  Also suggested that she could take mucinex to help clear up some of the mucus production.    I spent 60 minutes providing this consultation,  from 2:00 to 3:00. More than 50% of the time in this consultation was spent coordinating communication.   HISTORY OF PRESENT ILLNESS:  Tammy Fernandez is a 85 y.o. year old female with multiple medical problems including pulmonary hypertension, COPD, OSA, SIADH. Palliative Care was asked to help address goals of care.   CODE STATUS: full code  PPS: 50% HOSPICE ELIGIBILITY/DIAGNOSIS: TBD  PHYSICAL EXAM:  BP 128/58  HR 57  O2 sat 95% General: NAD, frail appearing Cardiovascular: regular rate and rhythm Pulmonary: lung sounds diminished but no abnormal breath sounds heard; normal respiratory effort  Extremities: no edema noted today; no joint deformities Neurological: Weakness but otherwise nonfocal; some forgetfulness  PAST MEDICAL HISTORY:  Past Medical History:  Diagnosis Date  . Anxiety   . Chronic lower back pain   . Constipation    h/o fecal disimpaction on 2017  . CVA (cerebral vascular accident) ([place]) 2015   neg workup, MRI small acute lacunar infarcts. ASA only  . Depression   . Diastolic CHF, chronic (HCC)    grade I, most recent echo Jan 2019  . Fatty liver   . GERD (gastroesophageal reflux disease)   .  Hyperlipidemia   . Hypertension   . Hyponatremia    multiple episodes with encephalopathy, renal thinks SIADH  . Hypothyroidism, unspecified   . Internal hemorrhoid   . NSTEMI (non-ST elevated myocardial infarction) ([place]) 08/28/2016  . Palpitations   . PFO with atrial septal aneurysm    TTE 2015  . Pneumonia 2016  . Severe pulmonary arterial systolic hypertension ([place]) 12/25/2017   per echo, on 1L oxgen via [place]    SOCIAL HX:  Social History   Tobacco Use  . Smoking status: Never Smoker  . Smokeless tobacco: Former Systems developer    Types: Snuff  . Tobacco comment: "used snuff when I was 19"  Substance Use Topics  . Alcohol use: No    ALLERGIES:  Allergies  Allergen Reactions  . Crestor [Rosuvastatin Calcium] Other (See Comments)    Muscle Aches  . Keflex [Cephalexin] Other (See Comments)    dizziness  . Peanut Butter Flavor Hives  . Sulfa Antibiotics Hives  . Cephalexin Other (See Comments)    dizziness  . Sulfa Drugs Cross Reactors Rash     PERTINENT MEDICATIONS:  Outpatient Encounter Medications as of 07/23/2019  Medication Sig  . albuterol (PROVENTIL) (2.5 MG/3ML) 0.083% nebulizer solution Take 3 mLs (2.5 mg total) by nebulization every 4 (four) hours as needed for wheezing or shortness of breath.  . ALPRAZolam (XANAX) 1 MG tablet TAKE 1 TABLET(1 MG) BY MOUTH TWICE DAILY AS NEEDED FOR ANXIETY  . amLODipine (NORVASC) 10 MG tablet Take 1 tablet (10 mg total) by mouth daily. (Patient taking differently: Take 5 mg by mouth daily. )  . Aspirin 81 MG EC tablet Take 81 mg by mouth daily.  . cetirizine (ZYRTEC) 10 MG tablet Take 10 mg by mouth daily.  Marland Kitchen demeclocycline (DECLOMYCIN) 300 MG tablet Take 150 mg by mouth 2 (two) times a day.  . diclofenac sodium (VOLTAREN) 1 % GEL Apply 2 g topically 4 (four) times daily.  Marland Kitchen docusate sodium (COLACE) 100 MG capsule Take 100 mg by mouth daily as needed for mild constipation.  . fluticasone (FLONASE) 50 MCG/ACT nasal spray Place 1 spray into  both nostrils 2 (two) times daily.  Marland Kitchen glycerin adult 2 g suppository Place 1 suppository rectally as needed for constipation.  Marland Kitchen levothyroxine (SYNTHROID) 50 MCG tablet Take 1 tablet (50 mcg total) by mouth daily before breakfast.  . meclizine (ANTIVERT) 12.5 MG tablet Take 1 tablet (12.5 mg total) by mouth 3 (three) times daily as needed for dizziness.  . mometasone (NASONEX) 50 MCG/ACT nasal spray Place 2 sprays into the nose daily.  Marland Kitchen nystatin cream (MYCOSTATIN) Apply 1 application topically 2 (two) times daily.  Marland Kitchen olopatadine (PATANOL) 0.1 % ophthalmic solution Place 1 drop into both eyes daily as needed for allergies.  Marland Kitchen omeprazole (PRILOSEC) 20 MG capsule Take 1 capsule (20 mg total) by mouth 2 (two) times daily before a meal.  .  propranolol ER (INDERAL LA) 60 MG 24 hr capsule Take 1 capsule (60 mg total) by mouth daily.   No facility-administered encounter medications on file as of 07/23/2019.       Braxen Dobek Marlena ClipperK Shiesha Jahn, NP

## 2019-07-24 ENCOUNTER — Other Ambulatory Visit: Payer: Self-pay

## 2019-07-28 ENCOUNTER — Telehealth: Payer: Self-pay | Admitting: Medical

## 2019-07-28 ENCOUNTER — Ambulatory Visit: Payer: Medicare Other | Admitting: Cardiovascular Disease

## 2019-07-28 NOTE — Telephone Encounter (Signed)
Attempted to call patient about her appointment on 8/26 @ 10:55. No answer, left voicemail instructing patient to wear a face mask for the entire appointment and no visitors are allowed during the visit. Patient instructed not to attend the appointment if she was any symptoms. Symptom list and office number left.

## 2019-07-29 ENCOUNTER — Encounter: Payer: Self-pay | Admitting: Family Medicine

## 2019-07-29 ENCOUNTER — Encounter: Payer: Medicare Other | Admitting: Medical

## 2019-07-30 ENCOUNTER — Institutional Professional Consult (permissible substitution): Payer: Medicare Other | Admitting: Neurology

## 2019-08-02 DIAGNOSIS — R7881 Bacteremia: Secondary | ICD-10-CM | POA: Diagnosis not present

## 2019-08-02 DIAGNOSIS — B9562 Methicillin resistant Staphylococcus aureus infection as the cause of diseases classified elsewhere: Secondary | ICD-10-CM | POA: Diagnosis not present

## 2019-08-02 DIAGNOSIS — J9601 Acute respiratory failure with hypoxia: Secondary | ICD-10-CM | POA: Diagnosis not present

## 2019-08-02 DIAGNOSIS — I6789 Other cerebrovascular disease: Secondary | ICD-10-CM | POA: Diagnosis not present

## 2019-08-03 ENCOUNTER — Telehealth: Payer: Self-pay

## 2019-08-03 ENCOUNTER — Other Ambulatory Visit: Payer: Self-pay

## 2019-08-03 DIAGNOSIS — I2721 Secondary pulmonary arterial hypertension: Secondary | ICD-10-CM

## 2019-08-03 NOTE — Telephone Encounter (Signed)
Referral placed for Pulmonary Dr. Elsworth Soho at [facility] per Dr. Pamella Pert

## 2019-08-05 ENCOUNTER — Other Ambulatory Visit: Payer: Medicare Other | Admitting: Adult Health Nurse Practitioner

## 2019-08-05 ENCOUNTER — Other Ambulatory Visit: Payer: Self-pay

## 2019-08-05 ENCOUNTER — Other Ambulatory Visit: Payer: Self-pay | Admitting: *Deleted

## 2019-08-05 ENCOUNTER — Telehealth: Payer: Self-pay | Admitting: Adult Health Nurse Practitioner

## 2019-08-05 DIAGNOSIS — Z515 Encounter for palliative care: Secondary | ICD-10-CM | POA: Diagnosis not present

## 2019-08-05 NOTE — Patient Outreach (Signed)
[facility]) Care Management  08/05/2019  TAEKO SCHAFFER [birth year] [phone]   RN Health Coach attempted follow up outreach call to patient.  Patient was unavailable. Phone line busy with no voicemail pick up  Plan: RN will call patient again within 10 business days.  [facility] Management [phone]

## 2019-08-05 NOTE — Telephone Encounter (Signed)
Returned patients call.  States that she has been running "hot" for past 2 days and that her BP has been up to 150/70.  Has not checked her temp. She is concerned but does not want to go to hospital.  Have tried to get her to call her PCP to see if they want COVID testing done.  Does not complain of any other COVID symptoms of cough, SOB, diarrhea, lose of taste or smell.  Supposed to have visit tomorrow with her but will go today at 11:30 to check her out Also left VM with son of change of appointment Luisana Lutzke K. Olena Heckle NP

## 2019-08-05 NOTE — Progress Notes (Signed)
Therapist, [facility] Consult Note Telephone: [phone]  Fax: [phone]  PATIENT NAME: Tammy Fernandez DOB: 1924-06-18 MRN: [id number]  PRIMARY CARE PROVIDER:   Myles LippsSantiago, Irma M, MD  REFERRING PROVIDER:  Myles LippsSantiago, Irma M, MD [address] [place],  [place] [address]  RESPONSIBLE PARTY:   Mare FerrariJerome Winterbottom, son [phone]      RECOMMENDATIONS and PLAN:  1.  Advanced care planning.  Patient wishes to be a full code.  Wants everything done for life.  No changes made today. Was supposed to have appointment tomorrow but patient called this morning complaining of feeling "hot" and was concerned for fever.  Does not want to go to the hospital. Came today to evaluate  2.  Fever.   States feeling "hot" for past couple of days. Denies any increased cough or SOB, diarrhea, N/V.  Patient does not have a fever with a temp of 96.  Skin warm and dry to touch.  She states that she was trying a new cream for her vaginal itching and thinks it may just be an allergic reaction as the rash has worsened as well.  She has stopped this and have encouraged her to just continue with her regular allergy medicine.  Discouraged her from using benadryl and educated her that the symptoms should stop now that she has stopped the cream.  She has appointment with Dr. Leretha PolSantiago this Friday and told her to let her know if her symptoms have not gone away.    3.  Vaginal itching.  The rash and itching was going away but have worsened with the new cream she has been using which she has now stopped.  She had an appointment with gynecology last week but cancelled it due to a death in the family.  Have encouraged her son to reschedule so that she can be evaluated for the vaginal itching that seems to come and go.    I spent 30 minutes providing this consultation,  from 11:30 to 12:00. More than 50% of the time in this consultation was spent coordinating communication.   HISTORY OF PRESENT ILLNESS:   Tammy Fernandez is a 85 y.o. year old female with multiple medical problems including pulmonary hypertension, COPD, OSA, SIADH. Palliative Care was asked to help address goals of care.   CODE STATUS: full code  PPS: 50% HOSPICE ELIGIBILITY/DIAGNOSIS: TBD  PHYSICAL EXAM:  BP 138/58  HR 59  O2 sat 99%  Temp 96 (oral) General: NAD, frail appearing Cardiovascular: regular rate and rhythm Pulmonary: lung sounds diminished but no abnormal breath sounds heard; normal respiratory effort Extremities:no edema noted today; no joint deformities Neurological: Weakness but otherwise nonfocal; some forgetfulness  PAST MEDICAL HISTORY:  Past Medical History:  Diagnosis Date  . Anxiety   . Chronic lower back pain   . Constipation    h/o fecal disimpaction on 2017  . CVA (cerebral vascular accident) (HCC) 2015   neg workup, MRI small acute lacunar infarcts. ASA only  . Depression   . Diastolic CHF, chronic (HCC)    grade I, most recent echo Jan 2019  . Fatty liver   . GERD (gastroesophageal reflux disease)   . Hyperlipidemia   . Hypertension   . Hyponatremia    multiple episodes with encephalopathy, renal thinks SIADH  . Hypothyroidism, unspecified   . Internal hemorrhoid   . NSTEMI (non-ST elevated myocardial infarction) (HCC) 08/28/2016  . Palpitations   . PFO with atrial septal aneurysm  TTE 2015  . Pneumonia 2016  . Severe pulmonary arterial systolic hypertension ([place]) 12/25/2017   per echo, on 1L oxgen via     SOCIAL HX:  Social History   Tobacco Use  . Smoking status: Never Smoker  . Smokeless tobacco: Former Systems developer    Types: Snuff  . Tobacco comment: "used snuff when I was 19"  Substance Use Topics  . Alcohol use: No    ALLERGIES:  Allergies  Allergen Reactions  . Crestor [Rosuvastatin Calcium] Other (See Comments)    Muscle Aches  . Keflex [Cephalexin] Other (See Comments)    dizziness  . Peanut Butter Flavor Hives  . Sulfa Antibiotics Hives  . Cephalexin Other  (See Comments)    dizziness  . Sulfa Drugs Cross Reactors Rash     PERTINENT MEDICATIONS:  Outpatient Encounter Medications as of 08/05/2019  Medication Sig  . albuterol (PROVENTIL) (2.5 MG/3ML) 0.083% nebulizer solution Take 3 mLs (2.5 mg total) by nebulization every 4 (four) hours as needed for wheezing or shortness of breath.  . ALPRAZolam (XANAX) 1 MG tablet TAKE 1 TABLET(1 MG) BY MOUTH TWICE DAILY AS NEEDED FOR ANXIETY  . amLODipine (NORVASC) 10 MG tablet Take 1 tablet (10 mg total) by mouth daily. (Patient taking differently: Take 5 mg by mouth daily. )  . Aspirin 81 MG EC tablet Take 81 mg by mouth daily.  . cetirizine (ZYRTEC) 10 MG tablet Take 10 mg by mouth daily.  Marland Kitchen demeclocycline (DECLOMYCIN) 300 MG tablet Take 150 mg by mouth 2 (two) times a day.  . diclofenac sodium (VOLTAREN) 1 % GEL Apply 2 g topically 4 (four) times daily.  Marland Kitchen docusate sodium (COLACE) 100 MG capsule Take 100 mg by mouth daily as needed for mild constipation.  . fluticasone (FLONASE) 50 MCG/ACT nasal spray Place 1 spray into both nostrils 2 (two) times daily.  Marland Kitchen glycerin adult 2 g suppository Place 1 suppository rectally as needed for constipation.  Marland Kitchen levothyroxine (SYNTHROID) 50 MCG tablet Take 1 tablet (50 mcg total) by mouth daily before breakfast.  . meclizine (ANTIVERT) 12.5 MG tablet Take 1 tablet (12.5 mg total) by mouth 3 (three) times daily as needed for dizziness.  . mometasone (NASONEX) 50 MCG/ACT nasal spray Place 2 sprays into the nose daily.  Marland Kitchen nystatin cream (MYCOSTATIN) Apply 1 application topically 2 (two) times daily.  Marland Kitchen olopatadine (PATANOL) 0.1 % ophthalmic solution Place 1 drop into both eyes daily as needed for allergies.  Marland Kitchen omeprazole (PRILOSEC) 20 MG capsule Take 1 capsule (20 mg total) by mouth 2 (two) times daily before a meal.  . propranolol ER (INDERAL LA) 60 MG 24 hr capsule Take 1 capsule (60 mg total) by mouth daily.   No facility-administered encounter medications on file as of  08/05/2019.       Marcey Persad Jenetta Downer, NP

## 2019-08-06 ENCOUNTER — Other Ambulatory Visit: Payer: Self-pay | Admitting: Family Medicine

## 2019-08-06 ENCOUNTER — Other Ambulatory Visit: Payer: Self-pay | Admitting: *Deleted

## 2019-08-06 DIAGNOSIS — R42 Dizziness and giddiness: Secondary | ICD-10-CM

## 2019-08-06 NOTE — Telephone Encounter (Signed)
Requested medication (s) are due for refill today: yes  Requested medication (s) are on the active medication list: yes  Last refill:  06/02/19  Future visit scheduled: yes  Notes to clinic: This refill cannot be delegated 90 day supply   Requested Prescriptions  Pending Prescriptions Disp Refills   meclizine (ANTIVERT) 12.5 MG tablet [Pharmacy Med Name: MECLIZINE 12.5MG  (RX) TABLETS] 810 tablet     Sig: TAKE 1 TABLET(12.5 MG) BY MOUTH THREE TIMES DAILY AS NEEDED FOR DIZZINESS     Not Delegated - Gastroenterology: Antiemetics Failed - 08/06/2019  1:32 PM      Failed - This refill cannot be delegated      Passed - Valid encounter within last 6 months    Recent Outpatient Visits          4 weeks ago Hyponatremia   [facility], Lilia Argue, MD   2 months ago SIADH (syndrome of inappropriate ADH production) ([place])   Primary Care at [facility], Arlie Solomons, MD   3 months ago Hyponatremia   [facility], Lilia Argue, MD   4 months ago SIADH (syndrome of inappropriate ADH production) [facility])   [facility] at [facility], Lilia Argue, MD   5 months ago Hyponatremia   [facility], Lilia Argue, MD      Future Appointments            Tomorrow Rutherford Guys, MD Primary Care at [place], [facility]

## 2019-08-06 NOTE — Patient Outreach (Signed)
[facility]) Care Management  08/06/2019  Tammy Fernandez [birth year] [id number]   RN Health Coach attempted follow up outreach call to patient.  Patient was unavailable. No  voicemail message pick up.  Plan: Unsuccessful outreach letter sent RN will close if no response in 10 business days   [place] Management [phone]

## 2019-08-07 ENCOUNTER — Ambulatory Visit (INDEPENDENT_AMBULATORY_CARE_PROVIDER_SITE_OTHER): Payer: Medicare Other | Admitting: Family Medicine

## 2019-08-07 ENCOUNTER — Other Ambulatory Visit: Payer: Self-pay

## 2019-08-07 ENCOUNTER — Encounter: Payer: Self-pay | Admitting: Family Medicine

## 2019-08-07 ENCOUNTER — Other Ambulatory Visit: Payer: Self-pay | Admitting: Family Medicine

## 2019-08-07 VITALS — BP 126/60 | HR 70 | Temp 98.7°F

## 2019-08-07 DIAGNOSIS — E871 Hypo-osmolality and hyponatremia: Secondary | ICD-10-CM | POA: Diagnosis not present

## 2019-08-07 DIAGNOSIS — I10 Essential (primary) hypertension: Secondary | ICD-10-CM

## 2019-08-07 DIAGNOSIS — E222 Syndrome of inappropriate secretion of antidiuretic hormone: Secondary | ICD-10-CM

## 2019-08-07 DIAGNOSIS — R42 Dizziness and giddiness: Secondary | ICD-10-CM | POA: Diagnosis not present

## 2019-08-07 DIAGNOSIS — N760 Acute vaginitis: Secondary | ICD-10-CM

## 2019-08-07 DIAGNOSIS — Z23 Encounter for immunization: Secondary | ICD-10-CM | POA: Diagnosis not present

## 2019-08-07 DIAGNOSIS — I2721 Secondary pulmonary arterial hypertension: Secondary | ICD-10-CM

## 2019-08-07 DIAGNOSIS — J9611 Chronic respiratory failure with hypoxia: Secondary | ICD-10-CM

## 2019-08-07 MED ORDER — MECLIZINE HCL 12.5 MG PO TABS: 12.5000 mg | ORAL_TABLET | Freq: Three times a day (TID) | ORAL | 3 refills | Status: DC | PRN

## 2019-08-07 NOTE — Patient Instructions (Signed)
° ° ° °  If you have lab work done today you will be contacted with your lab results within the next 2 weeks.  If you have not heard from us then please contact us. The fastest way to get your results is to register for My Chart. ° ° °IF you received an x-ray today, you will receive an invoice from [facility]. Please contact [facility] at [phone] with questions or concerns regarding your invoice.  ° °IF you received labwork today, you will receive an invoice from [facility]. Please contact [facility] at [phone] with questions or concerns regarding your invoice.  ° °Our billing staff will not be able to assist you with questions regarding bills from these companies. ° °You will be contacted with the lab results as soon as they are available. The fastest way to get your results is to activate your My Chart account. Instructions are located on the last page of this paperwork. If you have not heard from us regarding the results in 2 weeks, please contact this office. °  ° ° ° °

## 2019-08-07 NOTE — Telephone Encounter (Signed)
Forwarding medication refill request to the clinical pool for review. 

## 2019-08-07 NOTE — Progress Notes (Signed)
9/4/202011:24 AM  Tammy Fernandez [birth year], 85 y.o., female [id number]  Chief Complaint  Patient presents with  . Vaginal Pain    used vaginal suppository now having vaginal burn, missed the appt for gyn due to death in the family    HPI:   Patient is a 85 y.o. female with past medical history significant for hyponatremia2/2 SIADH, constipation, HTN,dCHF, severe pHTN, chronic respiratory failure on oxygen, CVA, GERD and seasonal allergies who presents today for vaginal discmfort  Patient with recurring vaginal irritation with discharge eval in clinic - yeast, treated, not resolving Referred to gyn Missed gyn appt due to death in the family Continues to use vaginal suppositories from health food store, thinks she might have had an allergic reaction as she feels her "body is hot", denies any rashes, swelling, SOB  Otherwise BP controlled with amlodipine 10mg   Patient with chronic vertigo, takes meclizine prn, request refill  Referral made for pulm/sleep  Has no other concerns today  Depression screen [facility] 2/9 08/07/2019 07/09/2019 06/02/2019  Decreased Interest 0 0 0  Down, Depressed, Hopeless 0 0 0  PHQ - 2 Score 0 0 0  Altered sleeping - - -  Tired, decreased energy - - -  Change in appetite - - -  Feeling bad or failure about yourself  - - -  Trouble concentrating - - -  Moving slowly or fidgety/restless - - -  Suicidal thoughts - - -  PHQ-9 Score - - -  Difficult doing work/chores - - -  Some recent data might be hidden    Fall Risk  08/07/2019 07/09/2019 06/02/2019 04/30/2019 03/30/2019  Falls in the past year? 0 - 0 1 0  Number falls in past yr: 0 - 0 0 0  Injury with Fall? 0 - 0 0 0  Risk for fall due to : - Impaired balance/gait;Impaired mobility;Impaired vision - - -  Risk for fall due to: Comment - - - - -  Follow up - - - Falls evaluation completed -     Allergies  Allergen Reactions  . Crestor [Rosuvastatin Calcium] Other (See Comments)    Muscle Aches  .  Keflex [Cephalexin] Other (See Comments)    dizziness  . Peanut Butter Flavor Hives  . Sulfa Antibiotics Hives  . Cephalexin Other (See Comments)    dizziness  . Sulfa Drugs Cross Reactors Rash    Prior to Admission medications   Medication Sig Start Date End Date Taking? Authorizing Provider  albuterol (PROVENTIL) (2.5 MG/3ML) 0.083% nebulizer solution Take 3 mLs (2.5 mg total) by nebulization every 4 (four) hours as needed for wheezing or shortness of breath. 06/02/19  Yes Stallings, Zoe A, MD  ALPRAZolam (XANAX) 1 MG tablet TAKE 1 TABLET(1 MG) BY MOUTH TWICE DAILY AS NEEDED FOR ANXIETY 06/15/19  Yes Rutherford Guys, MD  amLODipine (NORVASC) 10 MG tablet Take 1 tablet (10 mg total) by mouth daily. Patient taking differently: Take 5 mg by mouth daily.  04/30/19  Yes Rutherford Guys, MD  Aspirin 81 MG EC tablet Take 81 mg by mouth daily.   Yes [provider]  cetirizine (ZYRTEC) 10 MG tablet Take 10 mg by mouth daily.   Yes [provider]  demeclocycline (DECLOMYCIN) 300 MG tablet Take 150 mg by mouth 2 (two) times a day. 03/11/19  Yes [provider]  diclofenac sodium (VOLTAREN) 1 % GEL Apply 2 g topically 4 (four) times daily. 06/02/19  Yes Forrest Moron, MD  docusate sodium (COLACE) 100 MG capsule Take 100 mg by mouth daily as needed for mild constipation.   Yes [provider]  fluticasone (FLONASE) 50 MCG/ACT nasal spray Place 1 spray into both nostrils 2 (two) times daily. 02/16/19  Yes Myles Lipps, MD  glycerin adult 2 g suppository Place 1 suppository rectally as needed for constipation. 01/27/19  Yes Myles Lipps, MD  levothyroxine (SYNTHROID) 50 MCG tablet Take 1 tablet (50 mcg total) by mouth daily before breakfast. 06/04/19  Yes Corum, Minerva Fester, MD  meclizine (ANTIVERT) 12.5 MG tablet Take 1 tablet (12.5 mg total) by mouth 3 (three) times daily as needed for dizziness. 06/02/19  Yes Stallings, Zoe A, MD  mometasone (NASONEX) 50 MCG/ACT nasal  spray Place 2 sprays into the nose daily. 06/02/19  Yes Stallings, Zoe A, MD  nystatin cream (MYCOSTATIN) Apply 1 application topically 2 (two) times daily. 03/03/19  Yes Myles Lipps, MD  olopatadine (PATANOL) 0.1 % ophthalmic solution Place 1 drop into both eyes daily as needed for allergies. 02/16/19  Yes Myles Lipps, MD  omeprazole (PRILOSEC) 20 MG capsule Take 1 capsule (20 mg total) by mouth 2 (two) times daily before a meal. 04/03/19  Yes Myles Lipps, MD  propranolol ER (INDERAL LA) 60 MG 24 hr capsule Take 1 capsule (60 mg total) by mouth daily. 02/17/19  Yes Myles Lipps, MD    Past Medical History:  Diagnosis Date  . Anxiety   . Chronic lower back pain   . Constipation    h/o fecal disimpaction on 2017  . CVA (cerebral vascular accident) (HCC) 2015   neg workup, MRI small acute lacunar infarcts. ASA only  . Depression   . Diastolic CHF, chronic (HCC)    grade I, most recent echo Jan 2019  . Fatty liver   . GERD (gastroesophageal reflux disease)   . Hyperlipidemia   . Hypertension   . Hyponatremia    multiple episodes with encephalopathy, renal thinks SIADH  . Hypothyroidism, unspecified   . Internal hemorrhoid   . NSTEMI (non-ST elevated myocardial infarction) (HCC) 08/28/2016  . Palpitations   . PFO with atrial septal aneurysm    TTE 2015  . Pneumonia 2016  . Severe pulmonary arterial systolic hypertension (HCC) 12/25/2017   per echo, on 1L oxgen via [place]    Past Surgical History:  Procedure Laterality Date  . ABDOMINAL HYSTERECTOMY    . CATARACT EXTRACTION W/ INTRAOCULAR LENS  IMPLANT, BILATERAL Bilateral   . CESAREAN SECTION    . TEE WITHOUT CARDIOVERSION N/A 01/19/2014   Procedure: TRANSESOPHAGEAL ECHOCARDIOGRAM (TEE);  Surgeon: Laurey Morale, MD;  [facility] ENDOSCOPY;  Service: Cardiovascular;  Laterality: N/A;  [name]  . VAGINAL HYSTERECTOMY      Social History   Tobacco Use  . Smoking status: Never Smoker  . Smokeless tobacco: Former  Neurosurgeon    Types: Snuff  . Tobacco comment: "used snuff when I was 19"  Substance Use Topics  . Alcohol use: No    Family History  Problem Relation Age of Onset  . Stroke Father   . Hypertension Father   . Colon cancer Cousin   . Colon cancer Other        Aunt  . Diabetes Other        aunt and cousins  . Heart disease Other        cousins  . Healthy Daughter   . Healthy Son     Review  of Systems  Constitutional: Negative for chills and fever.  Respiratory: Negative for cough and shortness of breath.   Cardiovascular: Negative for chest pain, palpitations and leg swelling.  Gastrointestinal: Negative for abdominal pain, nausea and vomiting.  Neurological: Positive for dizziness.   Per hpi  OBJECTIVE:  Today's Vitals   08/07/19 1117  BP: 126/60  Pulse: 70  Temp: 98.7 F (37.1 C)  SpO2: 99%   There is no height or weight on file to calculate BMI.   Physical Exam Vitals signs and nursing note reviewed.  Constitutional:      Appearance: She is well-developed.  HENT:     Head: Normocephalic and atraumatic.     Mouth/Throat:     Pharynx: No oropharyngeal exudate.  Eyes:     General: No scleral icterus.    Conjunctiva/sclera: Conjunctivae normal.     Pupils: Pupils are equal, round, and reactive to light.  Neck:     Musculoskeletal: Neck supple.  Cardiovascular:     Rate and Rhythm: Normal rate and regular rhythm.     Heart sounds: Normal heart sounds. No murmur. No friction rub. No gallop.   Pulmonary:     Effort: Pulmonary effort is normal.     Breath sounds: Normal breath sounds. No wheezing or rales.  Skin:    General: Skin is warm and dry.  Neurological:     Mental Status: She is alert and oriented to person, place, and time.     No results found for this or any previous visit (from the past 24 hour(s)).  No results found.   ASSESSMENT and PLAN  1. Vaginitis and vulvovaginitis Advise patient to reschedule missed gyn appt  2. Hyponatremia -  Basic Metabolic Panel  3. Dizziness - meclizine (ANTIVERT) 12.5 MG tablet; Take 1 tablet (12.5 mg total) by mouth 3 (three) times daily as needed for dizziness.  4. Essential hypertension Controlled. Continue current regime.   5. Chronic respiratory failure with hypoxia (HCC) 6. Severe pulmonary arterial systolic hypertension (HCC) 7. SIADH (syndrome of inappropriate ADH production) (HCC) Referred to pulm/sleep  8. Need for prophylactic vaccination and inoculation against influenza - Flu Vaccine QUAD High Dose(Fluad)  Return in about 3 months (around 11/06/2019).    Myles LippsIrma M Santiago, MD Primary Care at [facility] [address] [place], [place] [address] Ph.  [phone] Fax [phone]

## 2019-08-08 LAB — BASIC METABOLIC PANEL
BUN/Creatinine Ratio: 27 (ref 12–28)
BUN: 19 mg/dL (ref 10–36)
CO2: 31 mmol/L — ABNORMAL HIGH (ref 20–29)
Calcium: 9.9 mg/dL (ref 8.7–10.3)
Chloride: 85 mmol/L — ABNORMAL LOW (ref 96–106)
Creatinine, Ser: 0.71 mg/dL (ref 0.57–1.00)
GFR calc Af Amer: 85 mL/min/{1.73_m2} (ref >59)
GFR calc non Af Amer: 74 mL/min/{1.73_m2} (ref >59)
Glucose: 111 mg/dL — ABNORMAL HIGH (ref 65–99)
Potassium: 4.5 mmol/L (ref 3.5–5.2)
Sodium: 132 mmol/L — ABNORMAL LOW (ref 134–144)

## 2019-08-11 ENCOUNTER — Telehealth: Payer: Self-pay | Admitting: Family Medicine

## 2019-08-11 ENCOUNTER — Other Ambulatory Visit: Payer: Self-pay | Admitting: Family Medicine

## 2019-08-11 NOTE — Telephone Encounter (Signed)
Medication Refill - Medication:demeclocycline (DECLOMYCIN) 300 MG tablet  Has the patient contacted their pharmacy? Yes.   (Agent: If no, request that the patient contact the pharmacy for the refill.) (Agent: If yes, when and what did the pharmacy advise?)  Preferred Pharmacy (with phone number or street name):  [facility] STORE [place], [place] - [place] [phone] (Phone) [phone] (Fax)     Agent: Please be advised that RX refills may take up to 3 business days. We ask that you follow-up with your pharmacy.

## 2019-08-11 NOTE — Telephone Encounter (Signed)
Called and informed pt son of labs, he would like to know is there anything else he can do to increase her sodium?

## 2019-08-11 NOTE — Telephone Encounter (Signed)
Patient's son called wanting lab results. Would like a call back please

## 2019-08-12 ENCOUNTER — Other Ambulatory Visit: Payer: Self-pay | Admitting: Family Medicine

## 2019-08-12 NOTE — Telephone Encounter (Signed)
Please let him know to continue working on fluid restriction, they can restart salt tabs. Also sodium goal is above 130, so currently I am not concerned with recent labs. thanks

## 2019-08-12 NOTE — Telephone Encounter (Signed)
Please advise on med refill. She was last seen on 07/2019 and this medication looks like a historical med.

## 2019-08-12 NOTE — Telephone Encounter (Signed)
Requested medication (s) are due for refill today: yes  Requested medication (s) are on the active medication list: yes  Last refill:  06/14/2019  Future visit scheduled: yes  Notes to clinic:  Review for refill   Requested Prescriptions  Pending Prescriptions Disp Refills   demeclocycline (DECLOMYCIN) 300 MG tablet [Pharmacy Med Name: DEMECLOCYCLINE 300MG  TABLETS] 30 tablet     Sig: TAKE 1/2 TABLET BY MOUTH TWICE DAILY     Off-Protocol Failed - 08/12/2019 10:58 AM      Failed - Medication not assigned to a protocol, review manually.      Passed - Valid encounter within last 12 months    Recent Outpatient Visits          5 days ago Vaginitis and vulvovaginitis   [facility] at [facility], Lilia Argue, MD   1 month ago Hyponatremia   [facility] at [facility], Lilia Argue, MD   2 months ago SIADH (syndrome of inappropriate ADH production) [facility])   Primary Care at [facility], Arlie Solomons, MD   3 months ago Hyponatremia   [facility] at [facility], Lilia Argue, MD   4 months ago SIADH (syndrome of inappropriate ADH production) [facility])   [facility] at [facility], Lilia Argue, MD      Future Appointments            In 2 months Rutherford Guys, MD Primary Care at [place], [facility]

## 2019-08-13 ENCOUNTER — Other Ambulatory Visit: Payer: Self-pay | Admitting: Family Medicine

## 2019-08-13 NOTE — Telephone Encounter (Signed)
Copied from [place] [phone]. Topic: Quick Communication - Rx Refill/Question >> Aug 13, 2019  8:33 AM Leward Quan A wrote: Medication: demeclocycline (DECLOMYCIN) 300 MG tablet Patient is all out of this medication and sodium is at 132  Has the patient contacted their pharmacy? Yes.   (Agent: If no, request that the patient contact the pharmacy for the refill.) (Agent: If yes, when and what did the pharmacy advise?)  Preferred Pharmacy (with phone number or street name): [facility] DRUG STORE [place], [place] - [place] [address] [phone] (Phone) [phone] (Fax)    Agent: Please be advised that RX refills may take up to 3 business days. We ask that you follow-up with your pharmacy.

## 2019-08-13 NOTE — Telephone Encounter (Signed)
Copied from [place] [phone]. Topic: General - Other >> Aug 13, 2019  8:36 AM Leward Quan A wrote: Reason for CRM: Patient son called to say that during last visit they requested refills on all medication and only refill sent to pharmacy was the meclizine and he is in need of all her medication ASAP please.

## 2019-08-13 NOTE — Telephone Encounter (Signed)
Requested medication (s) are due for refill today: yes  Requested medication (s) are on the active medication list: yes  Last refill: 03/2019  Future visit scheduled: yes  Notes to clinic: Patient is out of medication and her sodium is 132   Requested Prescriptions  Pending Prescriptions Disp Refills   demeclocycline (DECLOMYCIN) 300 MG tablet       Sig: Take 0.5 tablets (150 mg total) by mouth.     Off-Protocol Failed - 08/13/2019  8:43 AM      Failed - Medication not assigned to a protocol, review manually.      Passed - Valid encounter within last 12 months    Recent Outpatient Visits          6 days ago Vaginitis and vulvovaginitis   [facility] at [facility], Lilia Argue, MD   1 month ago Hyponatremia   [facility] Care at [facility], Lilia Argue, MD   2 months ago SIADH (syndrome of inappropriate ADH production) [facility])   Primary Care at [facility], Arlie Solomons, MD   3 months ago Hyponatremia   [facility] at [facility], Lilia Argue, MD   4 months ago SIADH (syndrome of inappropriate ADH production) [facility])   Primary Care at [facility], Lilia Argue, MD      Future Appointments            In 2 months Rutherford Guys, MD Primary Care at [place], [facility]

## 2019-08-13 NOTE — Telephone Encounter (Signed)
This medication was stopped after her last hospitalization. We have discussed multiple times that it is not meant to be taken chronically, which is why I wanted her to see a kidney specialist. I am happy to make another referral. thanks

## 2019-08-17 ENCOUNTER — Other Ambulatory Visit: Payer: Self-pay | Admitting: Family Medicine

## 2019-08-17 ENCOUNTER — Other Ambulatory Visit: Payer: Self-pay

## 2019-08-17 ENCOUNTER — Telehealth: Payer: Self-pay

## 2019-08-17 ENCOUNTER — Other Ambulatory Visit: Payer: Self-pay | Admitting: *Deleted

## 2019-08-17 ENCOUNTER — Telehealth: Payer: Self-pay | Admitting: Family Medicine

## 2019-08-17 NOTE — Telephone Encounter (Signed)
Pt has to be put in the hospital per son due to being taken off the demeclocycline. Another referral for kidney spec is being requested. Referral is pending. Pt son is requesting a call to discuss the medication.

## 2019-08-17 NOTE — Telephone Encounter (Signed)
Patient son is calling to

## 2019-08-17 NOTE — Telephone Encounter (Signed)
Requested medication (s) are due for refill today: yes  Requested medication (s) are on the active medication list: yes  Last refill:  03/2019  Future visit scheduled: yes  Notes to clinic:  Last refilled by historical provider    Requested Prescriptions  Pending Prescriptions Disp Refills   demeclocycline (DECLOMYCIN) 300 MG tablet [Pharmacy Med Name: DEMECLOCYCLINE 300MG  TABLETS] 30 tablet     Sig: TAKE 1/2 TABLET BY MOUTH TWICE DAILY     Off-Protocol Failed - 08/17/2019  1:29 PM      Failed - Medication not assigned to a protocol, review manually.      Passed - Valid encounter within last 12 months    Recent Outpatient Visits          1 week ago Vaginitis and vulvovaginitis   [facility], Lilia Argue, MD   1 month ago Hyponatremia   [facility], Lilia Argue, MD   2 months ago SIADH (syndrome of inappropriate ADH production) [facility])   Primary Care at [facility], Arlie Solomons, MD   3 months ago Hyponatremia   [facility], Lilia Argue, MD   4 months ago SIADH (syndrome of inappropriate ADH production) [facility])   [facility], Lilia Argue, MD      Future Appointments            In 2 months Rutherford Guys, MD Primary Care at [place], [facility]

## 2019-08-17 NOTE — Telephone Encounter (Signed)
Patient son is upset and mad because she needs her medication , states that another referral to kidney specialist can be place but he wants someone to call him today asap and would prefer that it would be Dr Ayesha Rumpf  [phone]  Urgently call

## 2019-08-17 NOTE — Patient Outreach (Signed)
[facility]) Care Management  08/17/2019  Tammy Fernandez 1924-11-01 [id number]   RN Health coach attempted outreach screening  calls x 4. RN sent unsuccessful outreach letter with  no response.   Plan : RN Health Coach will close.  [place] Care Management [phone]

## 2019-08-18 ENCOUNTER — Other Ambulatory Visit: Payer: Self-pay | Admitting: Family Medicine

## 2019-08-18 DIAGNOSIS — E871 Hypo-osmolality and hyponatremia: Secondary | ICD-10-CM

## 2019-08-18 DIAGNOSIS — E222 Syndrome of inappropriate secretion of antidiuretic hormone: Secondary | ICD-10-CM

## 2019-08-18 NOTE — Telephone Encounter (Signed)
Discussed concerns with son.

## 2019-08-18 NOTE — Progress Notes (Signed)
Refilling declomycin until able to see renal Already on fluid restriction Taking salt tabs 1 tab BID Last na 132 H/o recurrent metabolic encephalopathy

## 2019-08-21 NOTE — Telephone Encounter (Signed)
Pt son requesting call back from [place].

## 2019-08-21 NOTE — Telephone Encounter (Signed)
This has already been addressed I spoke with [name] several days ago.

## 2019-08-24 ENCOUNTER — Other Ambulatory Visit: Payer: Medicare Other | Admitting: Hospice

## 2019-08-24 ENCOUNTER — Other Ambulatory Visit: Payer: Self-pay

## 2019-08-24 DIAGNOSIS — Z515 Encounter for palliative care: Secondary | ICD-10-CM

## 2019-08-24 NOTE — Progress Notes (Signed)
[place] Consult Note Telephone: [phone]  Fax: [phone] PATIENT NAME: Tammy Fernandez DOB: 03-Jan-1924 MRN: [id number]  PRIMARY CARE PROVIDER:   Rutherford Guys, MD  REFERRING PROVIDER:  Rutherford Guys, MD [address] [place],  [place] [address]  RESPONSIBLE PARTY:  Everlene Farrier is [place]. [name], son, lives at home with patient, [phone]    RECOMMENDATIONS and PLAN:  1.  Advanced care planning.  Patient is currently a full code. I introduced Palliative Medicine as specialized medical care for people living with serious illness, aimed at facilitating advance care plan, relieving symptoms and establishing goals of care.  Patient's goals include to maximize quality of life and symptom management; to prevent hospitalization. Patient and [name] receptive to education and discussions on MOST form and the importance of advance care directive. Appointment for 09/17/2019 where Melina Copa and patient will be present to further discuss advance care planning.  2. COPD: Patient continues on continuous oxygen supplementation and CPAP at night. She denied SOB. Education provided on using her nebulizers as ordered, pursed lip breathing and use of fan as indicated. 3.  Vaginal itching. Appointment rescheduled to see Gyneocologist 09/16/2019. Itching has reduced at this time. 4.  Follow up Palliative Care Visit: Palliative care will continue to follow for goals of care clarification and symptom management. Return in 3 weeks to consolidate advance care planning discussion/decisions  HISTORY OF PRESENT ILLNESS:  DION PARROW is a 85 year old female with multiple medical problems including pulmonary hypertension, COPD, OSA, SIADH. Palliative Care was asked to help address goals of care.   I spent 62 minutes providing this consultation from 12.30pm to 1.32pm. More than 50% of the time in this consultation was spent on advance care  plan discussions/education, chart review and coordinating communication.   CODE STATUS: Full  PPS: 50% HOSPICE ELIGIBILITY/DIAGNOSIS: TBD  PAST MEDICAL HISTORY:  Past Medical History:  Diagnosis Date   Anxiety    Chronic lower back pain    Constipation    h/o fecal disimpaction on 2017   CVA (cerebral vascular accident) ([place]) 2015   neg workup, MRI small acute lacunar infarcts. ASA only   Depression    Diastolic CHF, chronic (HCC)    grade I, most recent echo Jan 2019   Fatty liver    GERD (gastroesophageal reflux disease)    Hyperlipidemia    Hypertension    Hyponatremia    multiple episodes with encephalopathy, renal thinks SIADH   Hypothyroidism, unspecified    Internal hemorrhoid    NSTEMI (non-ST elevated myocardial infarction) ([place]) 08/28/2016   Palpitations    PFO with atrial septal aneurysm    TTE 2015   Pneumonia 2016   Severe pulmonary arterial systolic hypertension ([place]) 12/25/2017   per echo, on 1L oxgen via     SOCIAL HX:  Social History   Tobacco Use   Smoking status: Never Smoker   Smokeless tobacco: Former Systems developer    Types: Snuff   Tobacco comment: "used snuff when I was 19"  Substance Use Topics   Alcohol use: No    ALLERGIES:  Allergies  Allergen Reactions   Crestor [Rosuvastatin Calcium] Other (See Comments)    Muscle Aches   Keflex [Cephalexin] Other (See Comments)    dizziness   Peanut Butter Flavor Hives   Sulfa Antibiotics Hives   Cephalexin Other (See Comments)    dizziness   Sulfa Drugs Cross Reactors Rash     PERTINENT MEDICATIONS:  Outpatient Encounter Medications as of 08/24/2019  Medication Sig   albuterol (PROVENTIL) (2.5 MG/3ML) 0.083% nebulizer solution Take 3 mLs (2.5 mg total) by nebulization every 4 (four) hours as needed for wheezing or shortness of breath.   ALPRAZolam (XANAX) 1 MG tablet TAKE 1 TABLET(1 MG) BY MOUTH TWICE DAILY AS NEEDED FOR ANXIETY   amLODipine (NORVASC) 10 MG tablet  TAKE 1 TABLET(10 MG) BY MOUTH DAILY   Aspirin 81 MG EC tablet Take 81 mg by mouth daily.   cetirizine (ZYRTEC) 10 MG tablet Take 10 mg by mouth daily.   demeclocycline (DECLOMYCIN) 300 MG tablet TAKE 1/2 TABLET BY MOUTH TWICE DAILY   diclofenac sodium (VOLTAREN) 1 % GEL Apply 2 g topically 4 (four) times daily.   docusate sodium (COLACE) 100 MG capsule Take 100 mg by mouth daily as needed for mild constipation.   fluticasone (FLONASE) 50 MCG/ACT nasal spray Place 1 spray into both nostrils 2 (two) times daily.   glycerin adult 2 g suppository Place 1 suppository rectally as needed for constipation.   levothyroxine (SYNTHROID) 50 MCG tablet Take 1 tablet (50 mcg total) by mouth daily before breakfast.   meclizine (ANTIVERT) 12.5 MG tablet Take 1 tablet (12.5 mg total) by mouth 3 (three) times daily as needed for dizziness.   mometasone (NASONEX) 50 MCG/ACT nasal spray Place 2 sprays into the nose daily.   nystatin cream (MYCOSTATIN) Apply 1 application topically 2 (two) times daily.   olopatadine (PATANOL) 0.1 % ophthalmic solution Place 1 drop into both eyes daily as needed for allergies.   omeprazole (PRILOSEC) 20 MG capsule Take 1 capsule (20 mg total) by mouth 2 (two) times daily before a meal.   propranolol ER (INDERAL LA) 60 MG 24 hr capsule TAKE 1 CAPSULE(60 MG) BY MOUTH DAILY   No facility-administered encounter medications on file as of 08/24/2019.     PHYSICAL EXAM / ROS:   General: NAD, pleasant, able to answer questions coherently Cardiovascular: Regular rate and rhythm; no chest pain reported, no edema,  Pulmonary: no cough, no SOB Abdomen: soft, active bowel sounds in all quadrantsSkin: no rashes or wounds to exposed skin Neurological: Weakness, alert and oriented x 3 Rosaura Carpenter, NP

## 2019-08-31 DIAGNOSIS — R7881 Bacteremia: Secondary | ICD-10-CM | POA: Diagnosis not present

## 2019-08-31 DIAGNOSIS — J9601 Acute respiratory failure with hypoxia: Secondary | ICD-10-CM | POA: Diagnosis not present

## 2019-08-31 DIAGNOSIS — J441 Chronic obstructive pulmonary disease with (acute) exacerbation: Secondary | ICD-10-CM | POA: Diagnosis not present

## 2019-08-31 DIAGNOSIS — I6789 Other cerebrovascular disease: Secondary | ICD-10-CM | POA: Diagnosis not present

## 2019-09-02 DIAGNOSIS — I6789 Other cerebrovascular disease: Secondary | ICD-10-CM | POA: Diagnosis not present

## 2019-09-02 DIAGNOSIS — J9601 Acute respiratory failure with hypoxia: Secondary | ICD-10-CM | POA: Diagnosis not present

## 2019-09-02 DIAGNOSIS — B9562 Methicillin resistant Staphylococcus aureus infection as the cause of diseases classified elsewhere: Secondary | ICD-10-CM | POA: Diagnosis not present

## 2019-09-02 DIAGNOSIS — R7881 Bacteremia: Secondary | ICD-10-CM | POA: Diagnosis not present

## 2019-09-03 ENCOUNTER — Other Ambulatory Visit: Payer: Self-pay

## 2019-09-03 ENCOUNTER — Other Ambulatory Visit: Payer: Medicare Other | Admitting: Hospice

## 2019-09-03 ENCOUNTER — Telehealth: Payer: Self-pay | Admitting: Hospice

## 2019-09-03 DIAGNOSIS — Z515 Encounter for palliative care: Secondary | ICD-10-CM

## 2019-09-03 NOTE — Progress Notes (Signed)
[facility] Consult Note Telephone: [phone]  Fax: [phone]  TELEHEALTH VISIT STATEMENT Due to the COVID-19 crisis, this visit was done via telemedicine from my office. It was initiated and consented to by this patient and/or family.  PATIENT NAME: Tammy Fernandez [address] [place] [address] [phone] (home)  DOB: Sep 02, 1924 MRN: [id number]  PRIMARY CARE PROVIDER:   Rutherford Guys, MD, [address] [place] [address] [phone]  REFERRING PROVIDER:  Rutherford Guys, MD [address] [place],  [place] [address] [phone]  RESPONSIBLE PARTY:   Extended Emergency Contact Information Primary Emergency Contact: Tammy Fernandez Address: [place] [address], [place] Phone: [phone] Work Phone: [phone] Relation: Daughter Secondary Emergency Contact: [place] Phone: [phone] Mobile Phone: [phone] Relation: Daughter  Tammy Fernandez is [place].Tammy Fernandez, son, lives at home with patient, [phone]   ASSESSMENT AND RECOMMENDATIONS:   1. Advance Care Planning/Goals of Care: Goals include to maximize quality of life and symptom management, stay out of hospital.   2. Symptom Management: Tammy Fernandez called to report patient was having GERD symptoms of burning and also dizziness when she make position changes. Patient confirmed burning. She has not been taking her Omeprazole and Mecliizine.  Advised to take Omeprazole and Meclizine as ordered by PCP. To sit up one hour after food, avoid fried fatty foods, tight clothing and any aggravations. Slow position changes encouraged.  Tammy Fernandez verbalized understanding and expressed thanks.  5. Follow up Palliative Care Visit: Palliative care will continue to follow for goals of care clarification and symptom management.   I spent 25 minutes providing this consultation,  from  9.15am to  40am. More than 50% of the time in this consultation was spent coordinating communication.   HISTORY OF PRESENT ILLNESS:Tammy L Godboltis a 55year oldfemalewith multiple medical problems including pulmonary hypertension, COPD, OSA, SIADH. Palliative Care was asked to help address goals of care.   CODE STATUS: Full  PPS: 50% HOSPICE ELIGIBILITY/DIAGNOSIS: TBD  PAST MEDICAL HISTORY:  Past Medical History:  Diagnosis Date  . Anxiety   . Chronic lower back pain   . Constipation    h/o fecal disimpaction on 2017  . CVA (cerebral vascular accident) ([place]) 2015   neg workup, MRI small acute lacunar infarcts. ASA only  . Depression   . Diastolic CHF, chronic (HCC)    grade I, most recent echo Jan 2019  . Fatty liver   . GERD (gastroesophageal reflux disease)   . Hyperlipidemia   . Hypertension   . Hyponatremia    multiple episodes with encephalopathy, renal thinks SIADH  . Hypothyroidism, unspecified   . Internal hemorrhoid   . NSTEMI (non-ST elevated myocardial infarction) ([place]) 08/28/2016  . Palpitations   . PFO with atrial septal aneurysm    TTE 2015  . Pneumonia 2016  . Severe pulmonary arterial systolic hypertension ([place]) 12/25/2017   per echo, on 1L oxgen via [place]    SOCIAL HX:  Social History   Tobacco Use  . Smoking status: Never Smoker  . Smokeless tobacco: Former Systems developer    Types: Snuff  . Tobacco comment: "used snuff when I was 19"  Substance Use Topics  . Alcohol use: No    ALLERGIES:  Allergies  Allergen Reactions  . Crestor [Rosuvastatin Calcium] Other (See Comments)    Muscle Aches  . Keflex [Cephalexin] Other (See Comments)  dizziness  . Peanut Butter Flavor Hives  . Sulfa Antibiotics Hives  . Cephalexin Other (See Comments)    dizziness  . Sulfa Drugs Cross Reactors Rash     PERTINENT MEDICATIONS:  Outpatient Encounter Medications as of 09/03/2019  Medication Sig  . albuterol (PROVENTIL) (2.5 MG/3ML) 0.083% nebulizer solution Take 3 mLs (2.5  mg total) by nebulization every 4 (four) hours as needed for wheezing or shortness of breath.  . ALPRAZolam (XANAX) 1 MG tablet TAKE 1 TABLET(1 MG) BY MOUTH TWICE DAILY AS NEEDED FOR ANXIETY  . amLODipine (NORVASC) 10 MG tablet TAKE 1 TABLET(10 MG) BY MOUTH DAILY  . Aspirin 81 MG EC tablet Take 81 mg by mouth daily.  . cetirizine (ZYRTEC) 10 MG tablet Take 10 mg by mouth daily.  Marland Kitchen demeclocycline (DECLOMYCIN) 300 MG tablet TAKE 1/2 TABLET BY MOUTH TWICE DAILY  . diclofenac sodium (VOLTAREN) 1 % GEL Apply 2 g topically 4 (four) times daily.  Marland Kitchen docusate sodium (COLACE) 100 MG capsule Take 100 mg by mouth daily as needed for mild constipation.  . fluticasone (FLONASE) 50 MCG/ACT nasal spray Place 1 spray into both nostrils 2 (two) times daily.  Marland Kitchen glycerin adult 2 g suppository Place 1 suppository rectally as needed for constipation.  Marland Kitchen levothyroxine (SYNTHROID) 50 MCG tablet Take 1 tablet (50 mcg total) by mouth daily before breakfast.  . meclizine (ANTIVERT) 12.5 MG tablet Take 1 tablet (12.5 mg total) by mouth 3 (three) times daily as needed for dizziness.  . mometasone (NASONEX) 50 MCG/ACT nasal spray Place 2 sprays into the nose daily.  Marland Kitchen nystatin cream (MYCOSTATIN) Apply 1 application topically 2 (two) times daily.  Marland Kitchen olopatadine (PATANOL) 0.1 % ophthalmic solution Place 1 drop into both eyes daily as needed for allergies.  Marland Kitchen omeprazole (PRILOSEC) 20 MG capsule Take 1 capsule (20 mg total) by mouth 2 (two) times daily before a meal.  . propranolol ER (INDERAL LA) 60 MG 24 hr capsule TAKE 1 CAPSULE(60 MG) BY MOUTH DAILY   No facility-administered encounter medications on file as of 09/03/2019.     Tammy Carpenter, NP

## 2019-09-05 ENCOUNTER — Other Ambulatory Visit: Payer: Self-pay | Admitting: Family Medicine

## 2019-09-05 NOTE — Telephone Encounter (Signed)
Requested medication (s) are due for refill today:  Yes   Requested medication (s) are on the active medication list:  Yes  Future visit scheduled:  Yes  Last Refill:  06/15/19; #60; RF x 2  Requested Prescriptions  Pending Prescriptions Disp Refills   ALPRAZolam (XANAX) 1 MG tablet [Pharmacy Med Name: ALPRAZOLAM 1MG  TABLETS] 60 tablet     Sig: TAKE 1 TABLET(1 MG) BY MOUTH TWICE DAILY AS NEEDED FOR ANXIETY     Not Delegated - Psychiatry:  Anxiolytics/Hypnotics Failed - 09/05/2019  4:44 PM      Failed - This refill cannot be delegated      Failed - Urine Drug Screen completed in last 360 days.      Passed - Valid encounter within last 6 months    Recent Outpatient Visits          4 weeks ago Vaginitis and vulvovaginitis   [facility], Lilia Argue, MD   1 month ago Hyponatremia   [facility], Lilia Argue, MD   3 months ago SIADH (syndrome of inappropriate ADH production) [facility])   Primary Care at [facility], Arlie Solomons, MD   4 months ago Hyponatremia   [facility], Lilia Argue, MD   5 months ago SIADH (syndrome of inappropriate ADH production) [facility])   [facility], Lilia Argue, MD      Future Appointments            In 2 months Rutherford Guys, MD Primary Care at [place], [facility]

## 2019-09-07 NOTE — Telephone Encounter (Signed)
pmp reviewd, appropriate meds refilled 

## 2019-09-07 NOTE — Telephone Encounter (Signed)
refill on xanax medication

## 2019-09-08 ENCOUNTER — Other Ambulatory Visit: Payer: Self-pay

## 2019-09-08 ENCOUNTER — Other Ambulatory Visit: Payer: Medicare Other | Admitting: Hospice

## 2019-09-08 DIAGNOSIS — Z515 Encounter for palliative care: Secondary | ICD-10-CM | POA: Diagnosis not present

## 2019-09-08 NOTE — Progress Notes (Signed)
[place] Consult Note Telephone: [phone]  Fax: [phone]  PATIENT NAME: Tammy Fernandez DOB: 04-17-1924 MRN: [id number]  PRIMARY CARE PROVIDER:   Rutherford Guys, MD  REFERRING PROVIDER:  Rutherford Guys, MD [address] [place],  [place] [address]  RESPONSIBLE PARTY:Tammy Fernandez is HCPOA.Tammy Fernandez, son, lives at home with patient, [phone]   ASSESSMENT AND RECOMMENDATIONS:   1. Advance Care Planning/Goals of Care: Goals include to maximize quality of life and symptom management, stay out of hospital. Patient and Tammy Fernandez said they were afraid patient may go to the hospital if visit is not at least every 2 weeks. Emotional support provided; education on symptom management  and the need to stick to plan of care also addressed.   2. Symptom Management: Patient denied GERD symptoms today; she said taking her Omeprazole regularly as prescribed has helped to relieve the symptoms of burning. No diizziness. She said she experiences wheezing sometimes. No wheezing during visit. Education provided on using breathing treatment every 4 hours as needed for SOB/wheezing. She was encouraged to take her Zyrtec regularly as ordered. Some pills were seen on her bed. These were shown to Merriam who disposed of them as he was not sure which particular pills they were. Discussion on use of pill containers to ensure patient takes what she should take as at when due. Also need to put aside patient's daughter's medication out of patient's reach to prevent confusion and taking the wrong medication. Tammy Fernandez agreed to always stand and be sure she takes her correct pills incase they fall of from her hand.   5. Follow up Palliative Care Visit: Palliative care will continue to follow for goals of care clarification and symptom management.   I spent 65  minutes providing this consultation,  from  11.00am to 12.05pm. More than 50% of the time in this  consultation was spent coordinating communication.   HISTORY OF PRESENT ILLNESS:Tammy L Godboltis a 68year oldfemalewith multiple medical problems including pulmonary hypertension, COPD, OSA, SIADH. Palliative Care was asked to help address goals of care.  CODE STATUS: Full  PPS: 50% HOSPICE ELIGIBILITY/DIAGNOSIS: TBD  PAST MEDICAL HISTORY:  Past Medical History:  Diagnosis Date  . Anxiety   . Chronic lower back pain   . Constipation    h/o fecal disimpaction on 2017  . CVA (cerebral vascular accident) ([place]) 2015   neg workup, MRI small acute lacunar infarcts. ASA only  . Depression   . Diastolic CHF, chronic (HCC)    grade I, most recent echo Jan 2019  . Fatty liver   . GERD (gastroesophageal reflux disease)   . Hyperlipidemia   . Hypertension   . Hyponatremia    multiple episodes with encephalopathy, renal thinks SIADH  . Hypothyroidism, unspecified   . Internal hemorrhoid   . NSTEMI (non-ST elevated myocardial infarction) ([place]) 08/28/2016  . Palpitations   . PFO with atrial septal aneurysm    TTE 2015  . Pneumonia 2016  . Severe pulmonary arterial systolic hypertension ([place]) 12/25/2017   per echo, on 1L oxgen via [place]    SOCIAL HX:  Social History   Tobacco Use  . Smoking status: Never Smoker  . Smokeless tobacco: Former Systems developer    Types: Snuff  . Tobacco comment: "used snuff when I was 19"  Substance Use Topics  . Alcohol use: No    ALLERGIES:  Allergies  Allergen Reactions  . Crestor [Rosuvastatin Calcium] Other (See Comments)    Muscle  Aches  . Keflex [Cephalexin] Other (See Comments)    dizziness  . Peanut Butter Flavor Hives  . Sulfa Antibiotics Hives  . Cephalexin Other (See Comments)    dizziness  . Sulfa Drugs Cross Reactors Rash     PERTINENT MEDICATIONS:  Outpatient Encounter Medications as of 09/08/2019  Medication Sig  . albuterol (PROVENTIL) (2.5 MG/3ML) 0.083% nebulizer solution Take 3 mLs (2.5 mg total) by nebulization every 4 (four)  hours as needed for wheezing or shortness of breath.  . ALPRAZolam (XANAX) 1 MG tablet TAKE 1 TABLET(1 MG) BY MOUTH TWICE DAILY AS NEEDED FOR ANXIETY  . amLODipine (NORVASC) 10 MG tablet TAKE 1 TABLET(10 MG) BY MOUTH DAILY  . Aspirin 81 MG EC tablet Take 81 mg by mouth daily.  . cetirizine (ZYRTEC) 10 MG tablet Take 10 mg by mouth daily.  Marland Kitchen demeclocycline (DECLOMYCIN) 300 MG tablet TAKE 1/2 TABLET BY MOUTH TWICE DAILY  . diclofenac sodium (VOLTAREN) 1 % GEL Apply 2 g topically 4 (four) times daily.  Marland Kitchen docusate sodium (COLACE) 100 MG capsule Take 100 mg by mouth daily as needed for mild constipation.  . fluticasone (FLONASE) 50 MCG/ACT nasal spray Place 1 spray into both nostrils 2 (two) times daily.  Marland Kitchen glycerin adult 2 g suppository Place 1 suppository rectally as needed for constipation.  Marland Kitchen levothyroxine (SYNTHROID) 50 MCG tablet Take 1 tablet (50 mcg total) by mouth daily before breakfast.  . meclizine (ANTIVERT) 12.5 MG tablet Take 1 tablet (12.5 mg total) by mouth 3 (three) times daily as needed for dizziness.  . mometasone (NASONEX) 50 MCG/ACT nasal spray Place 2 sprays into the nose daily.  Marland Kitchen nystatin cream (MYCOSTATIN) Apply 1 application topically 2 (two) times daily.  Marland Kitchen olopatadine (PATANOL) 0.1 % ophthalmic solution Place 1 drop into both eyes daily as needed for allergies.  Marland Kitchen omeprazole (PRILOSEC) 20 MG capsule Take 1 capsule (20 mg total) by mouth 2 (two) times daily before a meal.  . propranolol ER (INDERAL LA) 60 MG 24 hr capsule TAKE 1 CAPSULE(60 MG) BY MOUTH DAILY   No facility-administered encounter medications on file as of 09/08/2019.     PHYSICAL EXAM / ROS:   General: NAD, pleasant, cooperative,  able to answer questions coherently Cardiovascular: Regular rate and rhythm; no chest pain reported, no edema. Pulmonary: no cough, no SOB/wheezing Abdomen: soft, active bowel sounds in all quadrants. Skin: no rashes or wounds to exposed skin Neurological: Weakness, alert and  oriented x 3 Rosaura Carpenter, NP  Rosaura Carpenter, NP

## 2019-09-11 ENCOUNTER — Other Ambulatory Visit: Payer: Self-pay | Admitting: Family Medicine

## 2019-09-11 NOTE — Telephone Encounter (Signed)
Requested medication (s) are due for refill today: no  Requested medication (s) are on the active medication list:no  Last refill:  06/14/2019  Future visit scheduled: yes  Notes to clinic:  Medication was discontinued   Requested Prescriptions  Pending Prescriptions Disp Refills   amLODipine (NORVASC) 5 MG tablet [Pharmacy Med Name: AMLODIPINE BESYLATE 5MG  TABLETS] 90 tablet 1    Sig: TAKE 1 TABLET(5 MG) BY MOUTH DAILY     Cardiovascular:  Calcium Channel Blockers Passed - 09/11/2019 10:15 AM      Passed - Last BP in normal range    BP Readings from Last 1 Encounters:  09/08/19 124/64         Passed - Valid encounter within last 6 months    Recent Outpatient Visits          1 month ago Vaginitis and vulvovaginitis   [facility], Lilia Argue, MD   2 months ago Hyponatremia   [facility], Lilia Argue, MD   3 months ago SIADH (syndrome of inappropriate ADH production) [facility])   Primary Care at [facility], Arlie Solomons, MD   4 months ago Hyponatremia   [facility], Lilia Argue, MD   5 months ago SIADH (syndrome of inappropriate ADH production) [facility])   [facility], Lilia Argue, MD      Future Appointments            In 1 month Rutherford Guys, MD Primary Care at [place], [facility]

## 2019-09-14 NOTE — Progress Notes (Signed)
Note done in error, no charge

## 2019-09-16 ENCOUNTER — Other Ambulatory Visit: Payer: Self-pay

## 2019-09-16 ENCOUNTER — Other Ambulatory Visit (HOSPITAL_COMMUNITY)
Admission: RE | Admit: 2019-09-16 | Discharge: 2019-09-16 | Disposition: A | Discharge status: Home / Self Care | Payer: Medicare Other | Source: Ambulatory Visit | Attending: Obstetrics and Gynecology | Admitting: Obstetrics and Gynecology

## 2019-09-16 ENCOUNTER — Ambulatory Visit (INDEPENDENT_AMBULATORY_CARE_PROVIDER_SITE_OTHER): Payer: Medicare Other | Admitting: Obstetrics and Gynecology

## 2019-09-16 ENCOUNTER — Encounter: Payer: Self-pay | Admitting: Obstetrics and Gynecology

## 2019-09-16 VITALS — BP 157/62 | HR 55 | Wt 162.0 lb

## 2019-09-16 DIAGNOSIS — N760 Acute vaginitis: Secondary | ICD-10-CM

## 2019-09-16 MED ORDER — CLOBETASOL PROPIONATE 0.05 % EX OINT: TOPICAL_OINTMENT | CUTANEOUS | 5 refills | Status: DC

## 2019-09-16 NOTE — Progress Notes (Signed)
85 yo with multiple medical problem here for the evaluation of vulva pruritis. Patient reports being treated for a yeast infection with a pill over a month ago and reports persistent pruritis. She denies any abnormal discharge. She denies any vaginal bleeding. Patient reports urinary incontinence which seems to have worsened over the past month. She admits to wetting the bed almost nightly. Patient states she was evaluated for UTI by PCP recently  Past Medical History:  Diagnosis Date  . Anxiety   . Chronic lower back pain   . Constipation    h/o fecal disimpaction on 2017  . CVA (cerebral vascular accident) ([place]) 2015   neg workup, MRI small acute lacunar infarcts. ASA only  . Depression   . Diastolic CHF, chronic (HCC)    grade I, most recent echo Jan 2019  . Fatty liver   . GERD (gastroesophageal reflux disease)   . Hyperlipidemia   . Hypertension   . Hyponatremia    multiple episodes with encephalopathy, renal thinks SIADH  . Hypothyroidism, unspecified   . Internal hemorrhoid   . NSTEMI (non-ST elevated myocardial infarction) ([place]) 08/28/2016  . Palpitations   . PFO with atrial septal aneurysm    TTE 2015  . Pneumonia 2016  . Severe pulmonary arterial systolic hypertension ([place]) 12/25/2017   per echo, on 1L oxgen via [place]   Past Surgical History:  Procedure Laterality Date  . ABDOMINAL HYSTERECTOMY    . CATARACT EXTRACTION W/ INTRAOCULAR LENS  IMPLANT, BILATERAL Bilateral   . CESAREAN SECTION    . TEE WITHOUT CARDIOVERSION N/A 01/19/2014   Procedure: TRANSESOPHAGEAL ECHOCARDIOGRAM (TEE);  Surgeon: Larey Dresser, MD;  Location: [facility];  Service: Cardiovascular;  Laterality: N/A;  [name]  . VAGINAL HYSTERECTOMY     Family History  Problem Relation Age of Onset  . Stroke Father   . Hypertension Father   . Colon cancer Cousin   . Colon cancer Other        Aunt  . Diabetes Other        aunt and cousins  . Heart disease Other        cousins  . Healthy Daughter    . Healthy Son    Social History   Tobacco Use  . Smoking status: Never Smoker  . Smokeless tobacco: Former Systems developer    Types: Snuff  . Tobacco comment: "used snuff when I was 19"  Substance Use Topics  . Alcohol use: No  . Drug use: No   ROS See pertinent in HPI Blood pressure (!) 157/62, pulse (!) 55, weight 162 lb (73.5 kg). GENERAL: Well-developed, well-nourished female in no acute distress. On oxygen PELVIC: Normal external female genitalia. Vagina is pale and atrophic.  Normal discharge.  NEURO: alert and oriented x 3  A/P 85 yo with vulvovaginitis and urinary incontinence - vaginal swab collected - Rx clobetasol cream provided - RTC in 4 weeks for urinary incontinence evaluation and pessary fitting if indicated - Patient will be contacted with abnormal results

## 2019-09-16 NOTE — Progress Notes (Signed)
Since about a month and still there. States theres bumps there and she cant hold her pee any more and has to get to the bathroom and sometimes wet the bed.

## 2019-09-17 ENCOUNTER — Telehealth: Payer: Self-pay | Admitting: Gastroenterology

## 2019-09-17 ENCOUNTER — Other Ambulatory Visit: Payer: Medicare Other | Admitting: Hospice

## 2019-09-17 DIAGNOSIS — Z515 Encounter for palliative care: Secondary | ICD-10-CM

## 2019-09-17 LAB — CERVICOVAGINAL ANCILLARY ONLY
Bacterial Vaginitis (gardnerella): NEGATIVE
Candida Glabrata: POSITIVE — AB
Candida Vaginitis: NEGATIVE
Comment: NEGATIVE
Comment: NEGATIVE
Comment: NEGATIVE

## 2019-09-17 NOTE — Telephone Encounter (Signed)
Pt's son is calling and said that his mother had a virtual visit with Dr. Ardis Hughs earlier this year and Dr. Ardis Hughs told them to call back in if the pt's symptoms start back up. The son stated that she is having a like a wet cough and he wants to speak to Dr. Ardis Hughs about the next steps to help his mother before she starts getting bad again.

## 2019-09-17 NOTE — Progress Notes (Addendum)
Therapist, nutritional Palliative Care Consult Note Telephone: [phone]  Fax: [phone]  PATIENT NAME: Tammy Fernandez DOB: 1924-09-27 MRN: [id number]  PRIMARY CARE PROVIDER:   Myles Lipps, MD  REFERRING PROVIDER:  Myles Lipps, MD [address] [place],  [place] [address]  RESPONSIBLE PARTY:Tammy Fernandez is HCPOA.Tammy Fernandez, son, lives at home with patient,[phone]     RECOMMENDATIONS/PLAN:  1. Advance Care Planning/Goals of Care:Marland Kitchen Visit consisted of building trust and  discussions on Palliative Medicine as specialized medical care for people living with serious illness, aimed at facilitating advance care plan, symptoms relief and establishing goals of care. Tammy Fernandez and other three siblings were present; they asked questions on  advance directive. They all conferred and affirmed that Patient's goals include to maximize quality of life and symptom. She is a Full code. MOST form signed today; selections include limited Feeding tube, antibiotics and IV fluids as indicated.  2. Symptom management: COPD Tammy Fernandez reports member continues to cough out clear phlegm despite use of breathing treatment and OTC Mucinex. Education provided on clear phlegm and morning cough as it relates to COPD. Patient is afebrile, no coughing during visit, in no respiratory distress. Encouraged continuing with breathing treatment. Tammy Fernandez would call to  see PCP since he would like to run labs/diagnostics to be sure nothing is wrong. Patient has rescue Albuterol.  I recommend use of Spiriva for daily maintenance. 3. Follow up Palliative Care Visit: Palliative care will continue to follow for goals of care clarification and symptom management.   I spent 60 minutes providing this consultation, from 2pm to 3pm. More than 50% of the time in this consultation was spent on coordinating advance care communication.Marland Kitchen   HISTORY OF PRESENT ILLNESS:Tammy L Godboltis a 89year oldfemalewith  multiple medical problems including pulmonary hypertension, COPD, OSA, SIADH. Palliative Care was asked to help address goals of care.   CODE STATUS: Full  PPS: 50% HOSPICE ELIGIBILITY/DIAGNOSIS: TBD  PAST MEDICAL HISTORY:  Past Medical History:  Diagnosis Date  . Anxiety   . Chronic lower back pain   . Constipation    h/o fecal disimpaction on 2017  . CVA (cerebral vascular accident) (HCC) 2015   neg workup, MRI small acute lacunar infarcts. ASA only  . Depression   . Diastolic CHF, chronic (HCC)    grade I, most recent echo Jan 2019  . Fatty liver   . GERD (gastroesophageal reflux disease)   . Hyperlipidemia   . Hypertension   . Hyponatremia    multiple episodes with encephalopathy, renal thinks SIADH  . Hypothyroidism, unspecified   . Internal hemorrhoid   . NSTEMI (non-ST elevated myocardial infarction) (HCC) 08/28/2016  . Palpitations   . PFO with atrial septal aneurysm    TTE 2015  . Pneumonia 2016  . Severe pulmonary arterial systolic hypertension (HCC) 12/25/2017   per echo, on 1L oxgen via [place]    SOCIAL HX:  Social History   Tobacco Use  . Smoking status: Never Smoker  . Smokeless tobacco: Former Neurosurgeon    Types: Snuff  . Tobacco comment: "used snuff when I was 19"  Substance Use Topics  . Alcohol use: No    ALLERGIES:  Allergies  Allergen Reactions  . Crestor [Rosuvastatin Calcium] Other (See Comments)    Muscle Aches  . Keflex [Cephalexin] Other (See Comments)    dizziness  . Peanut Butter Flavor Hives  . Sulfa Antibiotics Hives  . Cephalexin Other (See Comments)    dizziness  .  Sulfa Drugs Cross Reactors Rash     PERTINENT MEDICATIONS:  Outpatient Encounter Medications as of 09/17/2019  Medication Sig  . albuterol (PROVENTIL) (2.5 MG/3ML) 0.083% nebulizer solution Take 3 mLs (2.5 mg total) by nebulization every 4 (four) hours as needed for wheezing or shortness of breath.  . ALPRAZolam (XANAX) 1 MG tablet TAKE 1 TABLET(1 MG) BY MOUTH TWICE  DAILY AS NEEDED FOR ANXIETY  . amLODipine (NORVASC) 10 MG tablet TAKE 1 TABLET(10 MG) BY MOUTH DAILY  . Aspirin 81 MG EC tablet Take 81 mg by mouth daily.  . cetirizine (ZYRTEC) 10 MG tablet Take 10 mg by mouth daily.  . clobetasol ointment (TEMOVATE) 0.05 % Apply to affected area every night for 4 weeks, then every other day for 4 weeks and then twice a week for 4 weeks or until resolution.  Marland Kitchen demeclocycline (DECLOMYCIN) 300 MG tablet TAKE 1/2 TABLET BY MOUTH TWICE DAILY  . diclofenac sodium (VOLTAREN) 1 % GEL Apply 2 g topically 4 (four) times daily.  Marland Kitchen docusate sodium (COLACE) 100 MG capsule Take 100 mg by mouth daily as needed for mild constipation.  . fluticasone (FLONASE) 50 MCG/ACT nasal spray Place 1 spray into both nostrils 2 (two) times daily.  Marland Kitchen glycerin adult 2 g suppository Place 1 suppository rectally as needed for constipation.  Marland Kitchen levothyroxine (SYNTHROID) 50 MCG tablet Take 1 tablet (50 mcg total) by mouth daily before breakfast.  . meclizine (ANTIVERT) 12.5 MG tablet Take 1 tablet (12.5 mg total) by mouth 3 (three) times daily as needed for dizziness.  . mometasone (NASONEX) 50 MCG/ACT nasal spray Place 2 sprays into the nose daily.  Marland Kitchen nystatin cream (MYCOSTATIN) Apply 1 application topically 2 (two) times daily.  Marland Kitchen olopatadine (PATANOL) 0.1 % ophthalmic solution Place 1 drop into both eyes daily as needed for allergies.  Marland Kitchen omeprazole (PRILOSEC) 20 MG capsule Take 1 capsule (20 mg total) by mouth 2 (two) times daily before a meal.  . propranolol ER (INDERAL LA) 60 MG 24 hr capsule TAKE 1 CAPSULE(60 MG) BY MOUTH DAILY  . sertraline (ZOLOFT) 25 MG tablet TK 1 T PO D   No facility-administered encounter medications on file as of 09/17/2019.     PHYSICAL EXAM:   General: In no acute distress Cardiovascular: regular rate and rhythm Pulmonary: diminished in bil bases, no abnormal lung sounds, normal respiratory effort, no SOB, 02 99% on 2L/Min Abdomen: soft, nontender, + bowel  sounds GU: no suprapubic tenderness Extremities: no edema Skin: no rashes/wounds to exposed skin Neurological: Weakness, alert and oriented x 3  Teodoro Spray, NP

## 2019-09-17 NOTE — Telephone Encounter (Signed)
Left message on machine to call back  

## 2019-09-17 NOTE — Telephone Encounter (Signed)
The pt's son has called stating that the pt has developed a wet cough.  He reports that the pt does not seem to have any problems swallowing. She is taking Prilosec and 20 mg twice daily and does not complain about reflux.  I advised the pt's son to call PCP for evaluation and if GI is needed call back.

## 2019-09-18 ENCOUNTER — Telehealth: Payer: Self-pay | Admitting: *Deleted

## 2019-09-18 MED ORDER — FLUCONAZOLE 150 MG PO TABS: 150.0000 mg | ORAL_TABLET | ORAL | 3 refills | Status: DC

## 2019-09-18 NOTE — Telephone Encounter (Signed)
I called Tammy Fernandez as requested and informed her that her wet prep showed a yeast infection and Dr.Constant sent her a prescription of diflucan to her pharmacy. I explained she will take a dose , then after 3 days , repeat a dose, then after 3 days repeat a dose for a total of 3 doses . She voices understanding.  Graiden Henes,RN

## 2019-09-18 NOTE — Addendum Note (Signed)
Addended by: Mora Bellman on: 09/18/2019 07:55 AM   Modules accepted: Orders

## 2019-09-18 NOTE — Telephone Encounter (Signed)
-----   Message from Mora Bellman, MD sent at 09/18/2019  7:55 AM EDT ----- Please inform patient of yeast infection. A prescription for diflucan was prescribed which she should take every 3 days for 3 doses

## 2019-09-21 ENCOUNTER — Other Ambulatory Visit: Payer: Self-pay | Admitting: *Deleted

## 2019-09-21 ENCOUNTER — Other Ambulatory Visit: Payer: Self-pay | Admitting: Family Medicine

## 2019-09-21 ENCOUNTER — Other Ambulatory Visit: Payer: Self-pay

## 2019-09-21 ENCOUNTER — Telehealth (INDEPENDENT_AMBULATORY_CARE_PROVIDER_SITE_OTHER): Payer: Medicare Other | Admitting: Family Medicine

## 2019-09-21 DIAGNOSIS — J9611 Chronic respiratory failure with hypoxia: Secondary | ICD-10-CM | POA: Diagnosis not present

## 2019-09-21 DIAGNOSIS — Z789 Other specified health status: Secondary | ICD-10-CM

## 2019-09-21 DIAGNOSIS — M25512 Pain in left shoulder: Secondary | ICD-10-CM

## 2019-09-21 DIAGNOSIS — Z9981 Dependence on supplemental oxygen: Secondary | ICD-10-CM

## 2019-09-21 DIAGNOSIS — R5381 Other malaise: Secondary | ICD-10-CM

## 2019-09-21 DIAGNOSIS — J0100 Acute maxillary sinusitis, unspecified: Secondary | ICD-10-CM

## 2019-09-21 DIAGNOSIS — K219 Gastro-esophageal reflux disease without esophagitis: Secondary | ICD-10-CM | POA: Diagnosis not present

## 2019-09-21 DIAGNOSIS — Z7409 Other reduced mobility: Secondary | ICD-10-CM

## 2019-09-21 DIAGNOSIS — G8929 Other chronic pain: Secondary | ICD-10-CM

## 2019-09-21 MED ORDER — FLUCONAZOLE 150 MG PO TABS: 150.0000 mg | ORAL_TABLET | ORAL | 3 refills | Status: DC

## 2019-09-21 MED ORDER — AMOXICILLIN-POT CLAVULANATE 875-125 MG PO TABS: 1.0000 | ORAL_TABLET | Freq: Two times a day (BID) | ORAL | 0 refills | Status: DC

## 2019-09-21 MED ORDER — FAMOTIDINE 20 MG PO TABS: 20.0000 mg | ORAL_TABLET | Freq: Every day | ORAL | 1 refills | Status: DC

## 2019-09-21 NOTE — Progress Notes (Signed)
Pt is having coughing and phlegm. Taking mucinex and using neb for the sx. Denies fever, bodyaches and chills. This has been going on for almost a month

## 2019-09-21 NOTE — Patient Outreach (Signed)
[facility]) Care Management  09/21/2019   Tammy [name] 09/13/1924 [id number]   RN Health Coach received telephone call from patient son [name]. [employer] compliance verified. Case had been closed due to unsuccessful outreach. The son wanted to know if he could get a caregiver for the patient. RN discussed with the son if she needed physical therapy or other services. Per [name] the patient balance and gait are unsteady. The patient has a hx of CHF, COPD and dysphagia. The son requested information on mechanical soft diet and low sodium diet.Patient son has agreed to further outreach calls.     Current Medications:  Current Outpatient Medications  Medication Sig Dispense Refill  . albuterol (PROVENTIL) (2.5 MG/3ML) 0.083% nebulizer solution Take 3 mLs (2.5 mg total) by nebulization every 4 (four) hours as needed for wheezing or shortness of breath. 75 mL 5  . ALPRAZolam (XANAX) 1 MG tablet TAKE 1 TABLET(1 MG) BY MOUTH TWICE DAILY AS NEEDED FOR ANXIETY 60 tablet 2  . amLODipine (NORVASC) 10 MG tablet TAKE 1 TABLET(10 MG) BY MOUTH DAILY 90 tablet 1  . Aspirin 81 MG EC tablet Take 81 mg by mouth daily.    . cetirizine (ZYRTEC) 10 MG tablet Take 10 mg by mouth daily.    . clobetasol ointment (TEMOVATE) 0.05 % Apply to affected area every night for 4 weeks, then every other day for 4 weeks and then twice a week for 4 weeks or until resolution. 30 g 5  . demeclocycline (DECLOMYCIN) 300 MG tablet TAKE 1/2 TABLET BY MOUTH TWICE DAILY 60 tablet 0  . diclofenac sodium (VOLTAREN) 1 % GEL Apply 2 g topically 4 (four) times daily. 100 g 5  . docusate sodium (COLACE) 100 MG capsule Take 100 mg by mouth daily as needed for mild constipation.    . fluconazole (DIFLUCAN) 150 MG tablet Take 1 tablet (150 mg total) by mouth every 3 (three) days. For three doses 3 tablet 3  . fluticasone (FLONASE) 50 MCG/ACT nasal spray Place 1 spray into both nostrils 2 (two) times daily. 16 g 5  . glycerin  adult 2 g suppository Place 1 suppository rectally as needed for constipation. 12 suppository 2  . levothyroxine (SYNTHROID) 50 MCG tablet Take 1 tablet (50 mcg total) by mouth daily before breakfast. 30 tablet 11  . meclizine (ANTIVERT) 12.5 MG tablet Take 1 tablet (12.5 mg total) by mouth 3 (three) times daily as needed for dizziness. 30 tablet 3  . mometasone (NASONEX) 50 MCG/ACT nasal spray Place 2 sprays into the nose daily. 17 g 4  . nystatin cream (MYCOSTATIN) Apply 1 application topically 2 (two) times daily. 30 g 2  . olopatadine (PATANOL) 0.1 % ophthalmic solution Place 1 drop into both eyes daily as needed for allergies. 5 mL 5  . omeprazole (PRILOSEC) 20 MG capsule Take 1 capsule (20 mg total) by mouth 2 (two) times daily before a meal. 180 capsule 3  . propranolol ER (INDERAL LA) 60 MG 24 hr capsule TAKE 1 CAPSULE(60 MG) BY MOUTH DAILY 90 capsule 1  . sertraline (ZOLOFT) 25 MG tablet TK 1 T PO D     No current facility-administered medications for this visit.     Functional Status:  In your present state of health, do you have any difficulty performing the following activities: 09/21/2019  Hearing? N  Vision? N  Difficulty concentrating or making decisions? Y  Comment some short term memory loss  Walking or climbing stairs? YJeannie Fend  Comment uses a walker or wheelchair  Doing errands, shopping? Y  Comment needs Network engineer and eating ? Y  Comment son is caregiver  In the past six months, have you accidently leaked urine? Y  Comment Per son sometimes incont  Do you have problems with loss of bowel control? N  Managing your Medications? Y  Comment son prepares  Managing your Finances? Y  Comment son Database administrator or managing your Housekeeping? Y  Comment lives with son/ son caregiver  Some recent data might be hidden    Fall/Depression Screening: Fall Risk  09/21/2019 08/07/2019 07/09/2019  Falls in the past year? 0 0 -  Number falls in past yr: 0 0 -   Injury with Fall? 0 0 -  Risk for fall due to : Impaired balance/gait;Impaired mobility - Impaired balance/gait;Impaired mobility;Impaired vision  Risk for fall due to: Comment - - -  Follow up Falls prevention discussed;Falls evaluation completed;Education provided - -   PHQ 2/9 Scores 09/21/2019 08/07/2019 07/09/2019 06/02/2019 04/30/2019 03/30/2019 12/20/2018  PHQ - 2 Score 0 0 0 0 3 0 0  PHQ- 9 Score - - - - 6 - -   [facility] CM Care Plan Problem One     Most Recent Value  Care Plan Problem One  Knowledge deficit in self management of CHF  Role Documenting the Problem One  Health Coach  Care Plan for Problem One  Active  [facility] Long Term Goal   Patient and son will have a better understanding of CHF within the next 90 days  [facility] Long Term Goal Start Date  09/21/19  Interventions for Problem One Long Term Goal  RN discussed CHF and low sodium diet. RN discussed weighing patient, RN will send educational material and follow up with further discussion  [facility] CM Short Term Goal #1   Patient and son will have a better understanding of Low sodium diet within the next 30 days  [facility] CM Short Term Goal #1 Start Date  09/21/19  Interventions for Short Term Goal #1  RN discussed eating low sodium foods. RN  sent picture sheet of foods high and low in sodium. Rn sent education material on low sodium dash diet. Rn willfollow up with further discussion  [facility] CM Short Term Goal #2   Patient son will have a better understanding of  dysphadia diet within the next 30 days  [facility] CM Short Term Goal #2 Start Date  09/21/19  Interventions for Short Term Goal #2  RN discussed mechanical soft . RN sent educational material on mechanical soft diet and foods to eat.  RN will follow up with further discussion.  [facility] CM Short Term Goal #3  Patient son will have a better understanding of CHF action plan and zones within the next 30 days  [facility] CM Short Term Goal #3 Start Date  09/21/19  Interventions for Short Tern Goal #3  RN discussed  CHF. RN sent education material on CHF exacerbation and zones and action plan. RN will follow up outreach with further discussion.      Assessment:  Patient has dysphagia Son requested addition information on low sodium diet Son requested information on Mechanical soft diet Son is not doing daily weights or checking blood pressure often Son inquiring about PT and a caregiver Son and patient  will benefit from [employer] telephonic outreach for education and support for CHF self management.  Plan:  RN discussed chf exacerbation RN sent educational material on  CHF RN sent educational material on low sodium diet RN sent education material on mechanical soft diet RN sent 2020 Calendar book RN sent assessment and barriers letter to physician RN notified Dr office of son wanting PT and a caregiver for mother RN will follow up within the month of December  Derrico Zhong [facility] [phone]

## 2019-09-21 NOTE — Progress Notes (Signed)
Virtual Visit Note  I connected with patient on 09/21/19 at 340pm by phone (technical difficulties) and verified that I am speaking with the correct person using two identifiers. Tammy Fernandez is currently located at home and patient is currently with them during visit. The provider, Myles Lipps, MD is located in their office at time of visit.  I discussed the limitations, risks, security and privacy concerns of performing an evaluation and management service by telephone and the availability of in person appointments. I also discussed with the patient that there may be a patient responsible charge related to this service. The patient expressed understanding and agreed to proceed.   CC: cough  HPI ? Tammy Fernandez, son, helps provide history For past 3 weeks she has productive cough Gagging, feels mucous in her throat Has been using mucinex, albuterol neb, flonase She feels that her sinuses have been very congested Having headaches, dizziness Mild nausea No fever or chills No SOB Last sinus infection about a year ago Nurse from palliative care note reviewed - normal resp exam She does well with augmentin  Patient is requesting home health for PT: balance, strength, left shoulder (she cant lift her shoulder above her head) Needs helps with ADLs: unable to bath or dress herself She uses wheelchair/walker at home She uses oxygen 2L [place] 24/7   Having increase in reflux, worse at night Taking omeprazole BID with  Denies any choking/ swallowing issues  Allergies  Allergen Reactions  . Crestor [Rosuvastatin Calcium] Other (See Comments)    Muscle Aches  . Keflex [Cephalexin] Other (See Comments)    dizziness  . Peanut Butter Flavor Hives  . Sulfa Antibiotics Hives  . Cephalexin Other (See Comments)    dizziness  . Sulfa Drugs Cross Reactors Rash    Prior to Admission medications   Medication Sig Start Date End Date Taking? Authorizing Provider  albuterol (PROVENTIL) (2.5  MG/3ML) 0.083% nebulizer solution Take 3 mLs (2.5 mg total) by nebulization every 4 (four) hours as needed for wheezing or shortness of breath. 06/02/19   Doristine Bosworth, MD  ALPRAZolam (XANAX) 1 MG tablet TAKE 1 TABLET(1 MG) BY MOUTH TWICE DAILY AS NEEDED FOR ANXIETY 09/07/19   Myles Lipps, MD  amLODipine (NORVASC) 10 MG tablet TAKE 1 TABLET(10 MG) BY MOUTH DAILY 08/17/19   Myles Lipps, MD  Aspirin 81 MG EC tablet Take 81 mg by mouth daily.    [provider]  cetirizine (ZYRTEC) 10 MG tablet Take 10 mg by mouth daily.    [provider]  clobetasol ointment (TEMOVATE) 0.05 % Apply to affected area every night for 4 weeks, then every other day for 4 weeks and then twice a week for 4 weeks or until resolution. 09/16/19   Constant, Peggy, MD  demeclocycline (DECLOMYCIN) 300 MG tablet TAKE 1/2 TABLET BY MOUTH TWICE DAILY 08/18/19   Myles Lipps, MD  diclofenac sodium (VOLTAREN) 1 % GEL Apply 2 g topically 4 (four) times daily. 06/02/19   Doristine Bosworth, MD  docusate sodium (COLACE) 100 MG capsule Take 100 mg by mouth daily as needed for mild constipation.    [provider]  fluconazole (DIFLUCAN) 150 MG tablet Take 1 tablet (150 mg total) by mouth every 3 (three) days. For three doses 09/18/19   Constant, Peggy, MD  fluticasone (FLONASE) 50 MCG/ACT nasal spray Place 1 spray into both nostrils 2 (two) times daily. 02/16/19   Myles Lipps, MD  glycerin adult 2  g suppository Place 1 suppository rectally as needed for constipation. 01/27/19   Myles LippsSantiago, Jesika Men M, MD  levothyroxine (SYNTHROID) 50 MCG tablet Take 1 tablet (50 mcg total) by mouth daily before breakfast. 06/04/19   Corum, Minerva FesterLisa L, MD  meclizine (ANTIVERT) 12.5 MG tablet Take 1 tablet (12.5 mg total) by mouth 3 (three) times daily as needed for dizziness. 08/07/19   Myles LippsSantiago, Lenn Volker M, MD  mometasone (NASONEX) 50 MCG/ACT nasal spray Place 2 sprays into the nose daily. 06/02/19   Doristine BosworthStallings, Zoe A, MD  nystatin  cream (MYCOSTATIN) Apply 1 application topically 2 (two) times daily. 03/03/19   Myles LippsSantiago, Kelaiah Escalona M, MD  olopatadine (PATANOL) 0.1 % ophthalmic solution Place 1 drop into both eyes daily as needed for allergies. 02/16/19   Myles LippsSantiago, Khari Mally M, MD  omeprazole (PRILOSEC) 20 MG capsule Take 1 capsule (20 mg total) by mouth 2 (two) times daily before a meal. 04/03/19   Myles LippsSantiago, Tyquon Near M, MD  propranolol ER (INDERAL LA) 60 MG 24 hr capsule TAKE 1 CAPSULE(60 MG) BY MOUTH DAILY 08/17/19   Myles LippsSantiago, Blanton Kardell M, MD  sertraline (ZOLOFT) 25 MG tablet TK 1 T PO D 07/10/19   [provider]    Past Medical History:  Diagnosis Date  . Anxiety   . Chronic lower back pain   . Constipation    h/o fecal disimpaction on 2017  . CVA (cerebral vascular accident) (HCC) 2015   neg workup, MRI small acute lacunar infarcts. ASA only  . Depression   . Diastolic CHF, chronic (HCC)    grade I, most recent echo Jan 2019  . Fatty liver   . GERD (gastroesophageal reflux disease)   . Hyperlipidemia   . Hypertension   . Hyponatremia    multiple episodes with encephalopathy, renal thinks SIADH  . Hypothyroidism, unspecified   . Internal hemorrhoid   . NSTEMI (non-ST elevated myocardial infarction) (HCC) 08/28/2016  . Palpitations   . PFO with atrial septal aneurysm    TTE 2015  . Pneumonia 2016  . Severe pulmonary arterial systolic hypertension (HCC) 12/25/2017   per echo, on 1L oxgen via [place]    Past Surgical History:  Procedure Laterality Date  . ABDOMINAL HYSTERECTOMY    . CATARACT EXTRACTION W/ INTRAOCULAR LENS  IMPLANT, BILATERAL Bilateral   . CESAREAN SECTION    . TEE WITHOUT CARDIOVERSION N/A 01/19/2014   Procedure: TRANSESOPHAGEAL ECHOCARDIOGRAM (TEE);  Surgeon: Laurey Moralealton S McLean, MD;  [facility];  Service: Cardiovascular;  Laterality: N/A;  [name]/ja  . VAGINAL HYSTERECTOMY      Social History   Tobacco Use  . Smoking status: Never Smoker  . Smokeless tobacco: Former NeurosurgeonUser    Types: Snuff  .  Tobacco comment: "used snuff when I was 19"  Substance Use Topics  . Alcohol use: No    Family History  Problem Relation Age of Onset  . Stroke Father   . Hypertension Father   . Colon cancer Cousin   . Colon cancer Other        Aunt  . Diabetes Other        aunt and cousins  . Heart disease Other        cousins  . Healthy Daughter   . Healthy Son     ROS Per hpi  Objective  Vitals as reported by the patient: none   ASSESSMENT and PLAN 1. Acute non-recurrent maxillary sinusitis Discussed supportive measures, new meds r/se/b and RTC precautions.  2. Gastroesophageal reflux disease  without esophagitis Adding pepcid at bedtime, cont with omeprazole Reviewed LFM  3. Chronic left shoulder pain - Ambulatory referral to Home Health  4. Chronic respiratory failure with hypoxia ([place]) - Ambulatory referral to Home Health  5. Oxygen dependent - Ambulatory referral to Home Health  6. Physical deconditioning - Ambulatory referral to Home Health  7. Impaired mobility and ADLs - Ambulatory referral to [place]  Other orders - amoxicillin-clavulanate (AUGMENTIN) 875-125 MG tablet; Take 1 tablet by mouth 2 (two) times daily. - fluconazole (DIFLUCAN) 150 MG tablet; Take 1 tablet (150 mg total) by mouth every 3 (three) days. For three doses - famotidine (PEPCID) 20 MG tablet; Take 1 tablet (20 mg total) by mouth at bedtime. - AMB Referral to [place] [facility]  FOLLOW-UP: after renal   The above assessment and management plan was discussed with the patient. The patient verbalized understanding of and has agreed to the management plan. Patient is aware to call the clinic if symptoms persist or worsen. Patient is aware when to return to the clinic for a follow-up visit. Patient educated on when it is appropriate to go to the emergency department.    I provided 21 minutes of non-face-to-face time during this encounter.  Rutherford Guys, MD Primary Care at [facility] [place], [place] [address] Ph.  [phone] Fax [phone]

## 2019-09-25 DIAGNOSIS — E222 Syndrome of inappropriate secretion of antidiuretic hormone: Secondary | ICD-10-CM | POA: Diagnosis not present

## 2019-09-25 DIAGNOSIS — I10 Essential (primary) hypertension: Secondary | ICD-10-CM | POA: Diagnosis not present

## 2019-09-25 DIAGNOSIS — E871 Hypo-osmolality and hyponatremia: Secondary | ICD-10-CM | POA: Diagnosis not present

## 2019-09-28 ENCOUNTER — Other Ambulatory Visit: Payer: Self-pay

## 2019-09-28 ENCOUNTER — Other Ambulatory Visit: Payer: Medicare Other | Admitting: Hospice

## 2019-09-28 DIAGNOSIS — Z515 Encounter for palliative care: Secondary | ICD-10-CM

## 2019-09-28 NOTE — Progress Notes (Signed)
Tammy Fernandez: [phone]  Fax: [phone]  PATIENT NAME: Tammy Fernandez DOB: 25-Dec-1923 MRN: [id number]  PRIMARY CARE PROVIDER:   Rutherford Guys, MD  REFERRING PROVIDER:  Rutherford Guys, MD [address] [place],  [place] [address]  RESPONSIBLE PARTY:Tammy Fernandez is HCPOA.Tammy Fernandez, son, lives at home with patient,[phone]      RECOMMENDATIONS and PLAN:  1. Advance Care Planning/Goals of Care:Tammy Fernandez Kitchen Visit consisted of building trust and  discussions on Palliative Medicine as specialized medical care for people living with serious illness, aimed at facilitating advance care plan, symptoms relief and establishing goals of care. Goals of care include to maximize quality of health, symptom management and to keep patient out of the hospital. She is a Full code. MOST form uploaded in [place]. Discussed cutting back visit from every 2 weeks to monthly since patient currently stable.  Tammy Fernandez is agreeable and affirmed PCP  provides more support if needed.  2. Symptom management: COPD. Patient on antibiotic - Amoxicillin, per Tammy Fernandez for cough. Tammy Fernandez had called PCP and received the order. Current treatment is helpful with the cough/phlegm production. Education provided on the need to complete the antibiotics as ordered, even as patient feels better. Patient denied SOB and said she feels way better than before. Tammy Fernandez asked if he should call and get PCP to see patient after taking the antibiotics. Advised there will be no need if patient feels well; to call PCP if otherwise.  No coughing during visit.    Patient has rescue Albuterol.  I recommend use of Spiriva for daily COPD  maintenance. 3. Follow up Palliative Care Visit: Palliative care will continue to follow for goals of care clarification and symptom management.    I spent 25 minutes providing this consultation, from 1pm to 1.25pm. More than 50% of the time in this  consultation was spent on coordinating care communication.Tammy Fernandez Kitchen   HISTORY OF PRESENT ILLNESS:Tammy Fernandez a 17year oldfemalewith multiple medical problems including pulmonary hypertension, COPD, OSA, SIADH. Palliative Care was asked to help address goals of care.   CODE STATUS: Full  PPS: 50% HOSPICE ELIGIBILITY/DIAGNOSIS: TBD  PAST MEDICAL HISTORY:  Past Medical History:  Diagnosis Date  . Anxiety   . Chronic lower back pain   . Constipation    h/o fecal disimpaction on 2017  . CVA (cerebral vascular accident) ([place]) 2015   neg workup, MRI small acute lacunar infarcts. ASA only  . Depression   . Diastolic CHF, chronic (HCC)    grade I, most recent echo Jan 2019  . Fatty liver   . GERD (gastroesophageal reflux disease)   . Hyperlipidemia   . Hypertension   . Hyponatremia    multiple episodes with encephalopathy, renal thinks SIADH  . Hypothyroidism, unspecified   . Internal hemorrhoid   . NSTEMI (non-ST elevated myocardial infarction) ([place]) 08/28/2016  . Palpitations   . PFO with atrial septal aneurysm    TTE 2015  . Pneumonia 2016  . Severe pulmonary arterial systolic hypertension ([place]) 12/25/2017   per echo, on 1L oxgen via     SOCIAL HX:  Social History   Tobacco Use  . Smoking status: Never Smoker  . Smokeless tobacco: Former Systems developer    Types: Snuff  . Tobacco comment: "used snuff when I was 19"  Substance Use Topics  . Alcohol use: No    ALLERGIES:  Allergies  Allergen Reactions  . Crestor [Rosuvastatin Calcium] Other (See Comments)  Muscle Aches  . Keflex [Cephalexin] Other (See Comments)    dizziness  . Peanut Butter Flavor Hives  . Sulfa Antibiotics Hives  . Cephalexin Other (See Comments)    dizziness  . Sulfa Drugs Cross Reactors Rash     PERTINENT MEDICATIONS:  Outpatient Encounter Medications as of 09/28/2019  Medication Sig  . albuterol (PROVENTIL) (2.5 MG/3ML) 0.083% nebulizer solution Take 3 mLs (2.5 mg total) by nebulization  every 4 (four) hours as needed for wheezing or shortness of breath.  . ALPRAZolam (XANAX) 1 MG tablet TAKE 1 TABLET(1 MG) BY MOUTH TWICE DAILY AS NEEDED FOR ANXIETY  . amLODipine (NORVASC) 10 MG tablet TAKE 1 TABLET(10 MG) BY MOUTH DAILY  . amoxicillin-clavulanate (AUGMENTIN) 875-125 MG tablet Take 1 tablet by mouth 2 (two) times daily.  . Aspirin 81 MG EC tablet Take 81 mg by mouth daily.  . cetirizine (ZYRTEC) 10 MG tablet Take 10 mg by mouth daily.  . clobetasol ointment (TEMOVATE) 0.05 % Apply to affected area every night for 4 weeks, then every other day for 4 weeks and then twice a week for 4 weeks or until resolution.  Tammy Fernandez Kitchen demeclocycline (DECLOMYCIN) 300 MG tablet TAKE 1/2 TABLET BY MOUTH TWICE DAILY  . diclofenac sodium (VOLTAREN) 1 % GEL Apply 2 g topically 4 (four) times daily.  Tammy Fernandez Kitchen docusate sodium (COLACE) 100 MG capsule Take 100 mg by mouth daily as needed for mild constipation.  . famotidine (PEPCID) 20 MG tablet Take 1 tablet (20 mg total) by mouth at bedtime.  . fluconazole (DIFLUCAN) 150 MG tablet Take 1 tablet (150 mg total) by mouth every 3 (three) days. For three doses  . fluticasone (FLONASE) 50 MCG/ACT nasal spray Place 1 spray into both nostrils 2 (two) times daily.  Tammy Fernandez Kitchen glycerin adult 2 g suppository Place 1 suppository rectally as needed for constipation.  Tammy Fernandez Kitchen levothyroxine (SYNTHROID) 50 MCG tablet Take 1 tablet (50 mcg total) by mouth daily before breakfast.  . meclizine (ANTIVERT) 12.5 MG tablet Take 1 tablet (12.5 mg total) by mouth 3 (three) times daily as needed for dizziness.  . mometasone (NASONEX) 50 MCG/ACT nasal spray Place 2 sprays into the nose daily.  Tammy Fernandez Kitchen nystatin cream (MYCOSTATIN) Apply 1 application topically 2 (two) times daily.  Tammy Fernandez Kitchen olopatadine (PATANOL) 0.1 % ophthalmic solution Place 1 drop into both eyes daily as needed for allergies.  Tammy Fernandez Kitchen omeprazole (PRILOSEC) 20 MG capsule Take 1 capsule (20 mg total) by mouth 2 (two) times daily before a meal.  . propranolol ER  (INDERAL LA) 60 MG 24 hr capsule TAKE 1 CAPSULE(60 MG) BY MOUTH DAILY  . sertraline (ZOLOFT) 25 MG tablet TK 1 T PO D   No facility-administered encounter medications on file as of 09/28/2019.     PHYSICAL EXAM:  General: In no acute distress Cardiovascular: regular rate and rhythm Pulmonary: diminished in bil bases, no abnormal lung sounds, normal respiratory effort, no SOB, 02 99% on 2L/Min Abdomen: soft, nontender, + bowel sounds GU: no suprapubic tenderness Extremities: no edema Skin: no rashes/wounds to exposed skin Neurological: Weakness, alert and oriented x 3  Rosaura Carpenter, NP

## 2019-09-30 ENCOUNTER — Ambulatory Visit (INDEPENDENT_AMBULATORY_CARE_PROVIDER_SITE_OTHER): Payer: Medicare Other | Admitting: Pulmonary Disease

## 2019-09-30 ENCOUNTER — Other Ambulatory Visit: Payer: Self-pay

## 2019-09-30 VITALS — BP 124/50 | HR 56 | Temp 97.7°F

## 2019-09-30 DIAGNOSIS — I5032 Chronic diastolic (congestive) heart failure: Secondary | ICD-10-CM | POA: Diagnosis not present

## 2019-09-30 DIAGNOSIS — J9601 Acute respiratory failure with hypoxia: Secondary | ICD-10-CM | POA: Diagnosis not present

## 2019-09-30 DIAGNOSIS — I2721 Secondary pulmonary arterial hypertension: Secondary | ICD-10-CM

## 2019-09-30 DIAGNOSIS — J441 Chronic obstructive pulmonary disease with (acute) exacerbation: Secondary | ICD-10-CM | POA: Diagnosis not present

## 2019-09-30 DIAGNOSIS — J9611 Chronic respiratory failure with hypoxia: Secondary | ICD-10-CM | POA: Diagnosis not present

## 2019-09-30 DIAGNOSIS — I6789 Other cerebrovascular disease: Secondary | ICD-10-CM | POA: Diagnosis not present

## 2019-09-30 DIAGNOSIS — R7881 Bacteremia: Secondary | ICD-10-CM | POA: Diagnosis not present

## 2019-09-30 NOTE — Patient Instructions (Signed)
Thank you for visiting Dr. Valeta Harms at [facility]. Today we recommend the following:  Please set patient up with Drs Cyndra Numbers or [facility] for next available sleep consultation.  Return if symptoms worsen or fail to improve.    Please do your part to reduce the spread of COVID-19.

## 2019-09-30 NOTE — Progress Notes (Signed)
Synopsis: Referred in November 2019 for pulmonary hypertension, chronic respiratory failure requiring nasal cannula O2 supplement by Myles Lipps, MD  Subjective:   PATIENT ID: Tammy Fernandez GENDER: female DOB: [birth year], MRN: [id number]  Chief Complaint  Patient presents with  . Follow-up    PMH of pneumonia about 3 years ago, treated in the hospital. She was there for a long time and it took her a long time to recover per the patient.  Patient states that she has a relatively good life.  Is able to do most things she wants.  However when asking about her functional status she does state that she gets significantly short of breath with short of breath getting up from chair walking to the door.  She must walk with assistance assistance.  She is breathless with getting dressed.  Her mMRC is a 4.  She is a lifelong non-smoker.  She also denies illicit drug and alcohol abuse.  When asked about her goals in life and her current care plan.  She was pretty adamant to go back in the hospital if at all possible.  When I mentioned discussions regarding ICU admission or than ever the need for life support if she would ever want this sure that she would never want this.  However family that was in the room would interject and persuade differently.  The family also spent majority of the time talking about all of her medical problems and how they are trying to manage them at home.  The patient seems to be more concerned about eating the right things doing the right things to live longer.  She does not understand why she needs to wear oxygen.  Wants to know if she can get rid of her oxygen.  OV 09/30/2019: Patient here today for follow-up regarding her pulmonary hypertension.  She also has OSA on CPAP has been on this for several years.  Issues at night include waking up constantly to spit.  She feels like she has trouble controlling her secretions at nighttime.  And routinely she will just spit into her  CPAP mask.  She feels like she needs to have this fixed and she hates wearing it she does not wear it anymore.  As for her pulmonary hypertension her fluid management has maintained.  She still requiring oxygen but otherwise no additional change in her respiratory symptoms.  She has not had any significant lower extremity edema.   Past Medical History:  Diagnosis Date  . Anxiety   . Chronic lower back pain   . Constipation    h/o fecal disimpaction on 2017  . CVA (cerebral vascular accident) (HCC) 2015   neg workup, MRI small acute lacunar infarcts. ASA only  . Depression   . Diastolic CHF, chronic (HCC)    grade I, most recent echo Jan 2019  . Fatty liver   . GERD (gastroesophageal reflux disease)   . Hyperlipidemia   . Hypertension   . Hyponatremia    multiple episodes with encephalopathy, renal thinks SIADH  . Hypothyroidism, unspecified   . Internal hemorrhoid   . NSTEMI (non-ST elevated myocardial infarction) (HCC) 08/28/2016  . Palpitations   . PFO with atrial septal aneurysm    TTE 2015  . Pneumonia 2016  . Severe pulmonary arterial systolic hypertension (HCC) 12/25/2017   per echo, on 1L oxgen via [place]     Family History  Problem Relation Age of Onset  . Stroke Father   . Hypertension Father   .  Colon cancer Cousin   . Colon cancer Other        Aunt  . Diabetes Other        aunt and cousins  . Heart disease Other        cousins  . Healthy Daughter   . Healthy Son      Past Surgical History:  Procedure Laterality Date  . ABDOMINAL HYSTERECTOMY    . CATARACT EXTRACTION W/ INTRAOCULAR LENS  IMPLANT, BILATERAL Bilateral   . CESAREAN SECTION    . TEE WITHOUT CARDIOVERSION N/A 01/19/2014   Procedure: TRANSESOPHAGEAL ECHOCARDIOGRAM (TEE);  Surgeon: Laurey Moralealton S McLean, MD;  [facility];  Service: Cardiovascular;  Laterality: N/A;  [name]/ja  . VAGINAL HYSTERECTOMY      Social History   Socioeconomic History  . Marital status: Widowed    Spouse name:  Not on file  . Number of children: 7  . Years of education: Not on file  . Highest education level: Not on file  Occupational History  . Not on file  Social Needs  . Financial resource strain: Not on file  . Food insecurity    Worry: Not on file    Inability: Not on file  . Transportation needs    Medical: Not on file    Non-medical: Not on file  Tobacco Use  . Smoking status: Never Smoker  . Smokeless tobacco: Former NeurosurgeonUser    Types: Snuff  . Tobacco comment: "used snuff when I was 19"  Substance and Sexual Activity  . Alcohol use: No  . Drug use: No  . Sexual activity: Not Currently  Lifestyle  . Physical activity    Days per week: Not on file    Minutes per session: Not on file  . Stress: Not on file  Relationships  . Social Musicianconnections    Talks on phone: Not on file    Gets together: Not on file    Attends religious service: Not on file    Active member of club or organization: Not on file    Attends meetings of clubs or organizations: Not on file    Relationship status: Not on file  . Intimate partner violence    Fear of current or ex partner: Not on file    Emotionally abused: Not on file    Physically abused: Not on file    Forced sexual activity: Not on file  Other Topics Concern  . Not on file  Social History Narrative   ** Merged History Encounter **       Patient lives at home with her son           Allergies  Allergen Reactions  . Crestor [Rosuvastatin Calcium] Other (See Comments)    Muscle Aches  . Keflex [Cephalexin] Other (See Comments)    dizziness  . Peanut Butter Flavor Hives  . Sulfa Antibiotics Hives  . Cephalexin Other (See Comments)    dizziness  . Sulfa Drugs Cross Reactors Rash     Outpatient Medications Prior to Visit  Medication Sig Dispense Refill  . albuterol (PROVENTIL) (2.5 MG/3ML) 0.083% nebulizer solution Take 3 mLs (2.5 mg total) by nebulization every 4 (four) hours as needed for wheezing or shortness of breath. 75 mL 5   . ALPRAZolam (XANAX) 1 MG tablet TAKE 1 TABLET(1 MG) BY MOUTH TWICE DAILY AS NEEDED FOR ANXIETY 60 tablet 2  . amLODipine (NORVASC) 10 MG tablet TAKE 1 TABLET(10 MG) BY MOUTH DAILY 90 tablet 1  .  amoxicillin-clavulanate (AUGMENTIN) 875-125 MG tablet Take 1 tablet by mouth 2 (two) times daily. 20 tablet 0  . Aspirin 81 MG EC tablet Take 81 mg by mouth daily.    . cetirizine (ZYRTEC) 10 MG tablet Take 10 mg by mouth daily.    . clobetasol ointment (TEMOVATE) 0.05 % Apply to affected area every night for 4 weeks, then every other day for 4 weeks and then twice a week for 4 weeks or until resolution. 30 g 5  . demeclocycline (DECLOMYCIN) 300 MG tablet TAKE 1/2 TABLET BY MOUTH TWICE DAILY 60 tablet 0  . diclofenac sodium (VOLTAREN) 1 % GEL Apply 2 g topically 4 (four) times daily. 100 g 5  . docusate sodium (COLACE) 100 MG capsule Take 100 mg by mouth daily as needed for mild constipation.    . famotidine (PEPCID) 20 MG tablet Take 1 tablet (20 mg total) by mouth at bedtime. 90 tablet 1  . fluconazole (DIFLUCAN) 150 MG tablet Take 1 tablet (150 mg total) by mouth every 3 (three) days. For three doses 3 tablet 3  . fluticasone (FLONASE) 50 MCG/ACT nasal spray Place 1 spray into both nostrils 2 (two) times daily. 16 g 5  . glycerin adult 2 g suppository Place 1 suppository rectally as needed for constipation. 12 suppository 2  . levothyroxine (SYNTHROID) 50 MCG tablet Take 1 tablet (50 mcg total) by mouth daily before breakfast. 30 tablet 11  . meclizine (ANTIVERT) 12.5 MG tablet Take 1 tablet (12.5 mg total) by mouth 3 (three) times daily as needed for dizziness. 30 tablet 3  . mometasone (NASONEX) 50 MCG/ACT nasal spray Place 2 sprays into the nose daily. 17 g 4  . nystatin cream (MYCOSTATIN) Apply 1 application topically 2 (two) times daily. 30 g 2  . olopatadine (PATANOL) 0.1 % ophthalmic solution Place 1 drop into both eyes daily as needed for allergies. 5 mL 5  . omeprazole (PRILOSEC) 20 MG  capsule Take 1 capsule (20 mg total) by mouth 2 (two) times daily before a meal. 180 capsule 3  . propranolol ER (INDERAL LA) 60 MG 24 hr capsule TAKE 1 CAPSULE(60 MG) BY MOUTH DAILY 90 capsule 1  . sertraline (ZOLOFT) 25 MG tablet TK 1 T PO D     No facility-administered medications prior to visit.     Review of Systems  Constitutional: Negative for chills, fever, malaise/fatigue and weight loss.  HENT: Negative for hearing loss, sore throat and tinnitus.   Eyes: Negative for blurred vision and double vision.  Respiratory: Positive for cough and sputum production. Negative for hemoptysis, shortness of breath, wheezing and stridor.   Cardiovascular: Negative for chest pain, palpitations, orthopnea, leg swelling and PND.  Gastrointestinal: Negative for abdominal pain, constipation, diarrhea, heartburn, nausea and vomiting.  Genitourinary: Negative for dysuria, hematuria and urgency.  Musculoskeletal: Negative for joint pain and myalgias.  Skin: Negative for itching and rash.  Neurological: Negative for dizziness, tingling, weakness and headaches.  Endo/Heme/Allergies: Negative for environmental allergies. Does not bruise/bleed easily.  Psychiatric/Behavioral: Negative for depression. The patient is not nervous/anxious and does not have insomnia.   All other systems reviewed and are negative.     Objective:  Physical Exam Vitals signs reviewed.  Constitutional:      General: She is not in acute distress.    Appearance: She is well-developed.  HENT:     Head: Normocephalic and atraumatic.     Mouth/Throat:     Pharynx: No oropharyngeal exudate.  Eyes:  Conjunctiva/sclera: Conjunctivae normal.     Pupils: Pupils are equal, round, and reactive to light.  Neck:     Vascular: No JVD.     Trachea: No tracheal deviation.     Comments: Loss of supraclavicular fat Cardiovascular:     Rate and Rhythm: Normal rate and regular rhythm.     Heart sounds: S1 normal and S2 normal. Murmur  present.     Comments: Distant heart tones, systolic murmur left lower sternal border Pulmonary:     Effort: No tachypnea or accessory muscle usage.     Breath sounds: No stridor. Decreased breath sounds (throughout all lung fields) present. No wheezing, rhonchi or rales.  Abdominal:     General: Bowel sounds are normal. There is no distension.     Palpations: Abdomen is soft.     Tenderness: There is no abdominal tenderness.  Musculoskeletal:        General: Deformity (muscle wasting ) present.  Skin:    General: Skin is warm and dry.     Capillary Refill: Capillary refill takes less than 2 seconds.     Findings: No rash.  Neurological:     Mental Status: She is alert and oriented to person, place, and time.  Psychiatric:        Behavior: Behavior normal.      Vitals:   09/30/19 1527  BP: (!) 124/50  Pulse: (!) 56  Temp: 97.7 F (36.5 C)  TempSrc: Oral  SpO2: 99%   99% on 2 L nasal cannula BMI Readings from Last 3 Encounters:  09/16/19 29.63 kg/m  07/09/19 31.09 kg/m  06/02/19 31.09 kg/m   Wt Readings from Last 3 Encounters:  09/16/19 162 lb (73.5 kg)  06/02/19 170 lb (77.1 kg)  05/19/19 160 lb (72.6 kg)     CBC    Component Value Date/Time   WBC 6.8 06/02/2019 1620   WBC 10.5 01/07/2019 0652   RBC 3.50 (L) 06/02/2019 1620   RBC 3.12 (L) 01/07/2019 0652   HGB 9.9 (L) 06/02/2019 1620   HCT 31.8 (L) 06/02/2019 1620   PLT 260 06/02/2019 1620   MCV 91 06/02/2019 1620   MCH 28.3 06/02/2019 1620   MCH 28.8 01/07/2019 0652   MCHC 31.1 (L) 06/02/2019 1620   MCHC 29.7 (L) 01/07/2019 0652   RDW 12.7 06/02/2019 1620   LYMPHSABS 3.4 (H) 05/04/2019 1511   MONOABS 0.8 01/07/2019 0652   EOSABS 0.2 05/04/2019 1511   BASOSABS 0.0 05/04/2019 1511    Chest Imaging: 12/21/2017: CT chest Chronic collapse right lower lobe, trace right pleural effusion.  Enlarged pulmonary arteries.  Pulmonary Functions Testing Results: No flowsheet data found.  FeNO: None    Pathology: None   Echocardiogram: 12/20/2017: Right ventricular systolic pressure peak 72 mmHg, severe pulmonary hypertension.  Heart Catheterization: None     Assessment & Plan:   Severe pulmonary arterial systolic hypertension (HCC)  Chronic diastolic CHF (congestive heart failure) (HCC)  Chronic hypoxemic respiratory failure [facility])  Discussion:  85 year old with severe pulmonary hypertension, New York Heart Association functional class IV symptoms baseline dyspnea.  She can only walk from her bedroom to the living room.  Otherwise her current maintained therapy includes diuretics to ensure euvolemia.  She is also on CPAP for her OSA.  Is not established with a sleep doctor at this time  Son states that they have completed the [facility] form.  I encouraged getting a copy of this to primary care or [place] so that  it can be scanned in the short chart.  We will have her establish care with one of the sleep physicians. As for pulmonary hypertension management I think diuretics with and maintaining euvolemia is the best therapy.  She can follow-up with [place] as needed or continue volume management with primary care.  I have set her up with one of our sleep physicians to discuss potentially changing her CPAP settings.  Patient does not like wearing the mask and would like to look at different options.  Greater than 50% of this patient's 15-minute visit was spent face-to-face discussing the recommendation treatment plan.   Current Outpatient Medications:  .  albuterol (PROVENTIL) (2.5 MG/3ML) 0.083% nebulizer solution, Take 3 mLs (2.5 mg total) by nebulization every 4 (four) hours as needed for wheezing or shortness of breath., Disp: 75 mL, Rfl: 5 .  ALPRAZolam (XANAX) 1 MG tablet, TAKE 1 TABLET(1 MG) BY MOUTH TWICE DAILY AS NEEDED FOR ANXIETY, Disp: 60 tablet, Rfl: 2 .  amLODipine (NORVASC) 10 MG tablet, TAKE 1 TABLET(10 MG) BY MOUTH DAILY, Disp: 90 tablet, Rfl: 1 .   amoxicillin-clavulanate (AUGMENTIN) 875-125 MG tablet, Take 1 tablet by mouth 2 (two) times daily., Disp: 20 tablet, Rfl: 0 .  Aspirin 81 MG EC tablet, Take 81 mg by mouth daily., Disp: , Rfl:  .  cetirizine (ZYRTEC) 10 MG tablet, Take 10 mg by mouth daily., Disp: , Rfl:  .  clobetasol ointment (TEMOVATE) 0.05 %, Apply to affected area every night for 4 weeks, then every other day for 4 weeks and then twice a week for 4 weeks or until resolution., Disp: 30 g, Rfl: 5 .  demeclocycline (DECLOMYCIN) 300 MG tablet, TAKE 1/2 TABLET BY MOUTH TWICE DAILY, Disp: 60 tablet, Rfl: 0 .  diclofenac sodium (VOLTAREN) 1 % GEL, Apply 2 g topically 4 (four) times daily., Disp: 100 g, Rfl: 5 .  docusate sodium (COLACE) 100 MG capsule, Take 100 mg by mouth daily as needed for mild constipation., Disp: , Rfl:  .  famotidine (PEPCID) 20 MG tablet, Take 1 tablet (20 mg total) by mouth at bedtime., Disp: 90 tablet, Rfl: 1 .  fluconazole (DIFLUCAN) 150 MG tablet, Take 1 tablet (150 mg total) by mouth every 3 (three) days. For three doses, Disp: 3 tablet, Rfl: 3 .  fluticasone (FLONASE) 50 MCG/ACT nasal spray, Place 1 spray into both nostrils 2 (two) times daily., Disp: 16 g, Rfl: 5 .  glycerin adult 2 g suppository, Place 1 suppository rectally as needed for constipation., Disp: 12 suppository, Rfl: 2 .  levothyroxine (SYNTHROID) 50 MCG tablet, Take 1 tablet (50 mcg total) by mouth daily before breakfast., Disp: 30 tablet, Rfl: 11 .  meclizine (ANTIVERT) 12.5 MG tablet, Take 1 tablet (12.5 mg total) by mouth 3 (three) times daily as needed for dizziness., Disp: 30 tablet, Rfl: 3 .  mometasone (NASONEX) 50 MCG/ACT nasal spray, Place 2 sprays into the nose daily., Disp: 17 g, Rfl: 4 .  nystatin cream (MYCOSTATIN), Apply 1 application topically 2 (two) times daily., Disp: 30 g, Rfl: 2 .  olopatadine (PATANOL) 0.1 % ophthalmic solution, Place 1 drop into both eyes daily as needed for allergies., Disp: 5 mL, Rfl: 5 .  omeprazole  (PRILOSEC) 20 MG capsule, Take 1 capsule (20 mg total) by mouth 2 (two) times daily before a meal., Disp: 180 capsule, Rfl: 3 .  propranolol ER (INDERAL LA) 60 MG 24 hr capsule, TAKE 1 CAPSULE(60 MG) BY MOUTH DAILY, Disp: 90 capsule, Rfl:  1 .  sertraline (ZOLOFT) 25 MG tablet, TK 1 T PO D, Disp: , Rfl:    Josephine Igo, DO [facility] 09/30/2019 3:47 PM

## 2019-10-02 DIAGNOSIS — I6789 Other cerebrovascular disease: Secondary | ICD-10-CM | POA: Diagnosis not present

## 2019-10-02 DIAGNOSIS — B9562 Methicillin resistant Staphylococcus aureus infection as the cause of diseases classified elsewhere: Secondary | ICD-10-CM | POA: Diagnosis not present

## 2019-10-02 DIAGNOSIS — J9601 Acute respiratory failure with hypoxia: Secondary | ICD-10-CM | POA: Diagnosis not present

## 2019-10-02 DIAGNOSIS — R7881 Bacteremia: Secondary | ICD-10-CM | POA: Diagnosis not present

## 2019-10-05 DIAGNOSIS — M545 Low back pain: Secondary | ICD-10-CM | POA: Diagnosis not present

## 2019-10-05 DIAGNOSIS — I252 Old myocardial infarction: Secondary | ICD-10-CM | POA: Diagnosis not present

## 2019-10-05 DIAGNOSIS — J9611 Chronic respiratory failure with hypoxia: Secondary | ICD-10-CM | POA: Diagnosis not present

## 2019-10-05 DIAGNOSIS — E785 Hyperlipidemia, unspecified: Secondary | ICD-10-CM | POA: Diagnosis not present

## 2019-10-05 DIAGNOSIS — I2721 Secondary pulmonary arterial hypertension: Secondary | ICD-10-CM | POA: Diagnosis not present

## 2019-10-05 DIAGNOSIS — I110 Hypertensive heart disease with heart failure: Secondary | ICD-10-CM | POA: Diagnosis not present

## 2019-10-05 DIAGNOSIS — K760 Fatty (change of) liver, not elsewhere classified: Secondary | ICD-10-CM | POA: Diagnosis not present

## 2019-10-05 DIAGNOSIS — M25512 Pain in left shoulder: Secondary | ICD-10-CM | POA: Diagnosis not present

## 2019-10-05 DIAGNOSIS — E039 Hypothyroidism, unspecified: Secondary | ICD-10-CM | POA: Diagnosis not present

## 2019-10-05 DIAGNOSIS — J0101 Acute recurrent maxillary sinusitis: Secondary | ICD-10-CM | POA: Diagnosis not present

## 2019-10-05 DIAGNOSIS — I5032 Chronic diastolic (congestive) heart failure: Secondary | ICD-10-CM | POA: Diagnosis not present

## 2019-10-06 ENCOUNTER — Telehealth: Payer: Self-pay | Admitting: Family Medicine

## 2019-10-06 NOTE — Telephone Encounter (Signed)
[facility] is calling to get verbal orders to be able to start physical therapy for this patient. Once a week for 4 weeks. You can give them a call back at [phone] and if there is no answer, you can leave a VM.

## 2019-10-08 ENCOUNTER — Institutional Professional Consult (permissible substitution): Payer: Medicare Other | Admitting: Pulmonary Disease

## 2019-10-09 ENCOUNTER — Telehealth: Payer: Self-pay | Admitting: Family Medicine

## 2019-10-09 NOTE — Telephone Encounter (Signed)
Medication Refill - Medication: albuterol (PROVENTIL) (2.5 MG/3ML) 0.083% nebulizer solution [[id number]]   Has the patient contacted their pharmacy? No. (Agent: If no, request that the patient contact the pharmacy for the refill.) (Agent: If yes, when and what did the pharmacy advise?)  Preferred Pharmacy (with phone number or street name): [facility] [place], [place] [address] [phone] (Phone)   They are needing this med today   Agent: Please be advised that RX refills may take up to 3 business days. We ask that you follow-up with your pharmacy.

## 2019-10-12 ENCOUNTER — Other Ambulatory Visit: Payer: Self-pay | Admitting: Family Medicine

## 2019-10-12 ENCOUNTER — Telehealth: Payer: Self-pay | Admitting: Family Medicine

## 2019-10-12 NOTE — Telephone Encounter (Signed)
Spoke to [facility] and okayed verbal orders. Also spoke to provider and she was in agreement with the plan.

## 2019-10-12 NOTE — Telephone Encounter (Signed)
Medication Refill - Medication: demeclocycline (DECLOMYCIN) 300 MG tablet and albuterol (PROVENTIL) (2.5 MG/3ML) 0.083% nebulizer solution    Preferred Pharmacy (with phone number or street name):  [facility] [place], [place] - [address] [phone] (Phone) [phone] (Fax)     Agent: Please be advised that RX refills may take up to 3 business days. We ask that you follow-up with your pharmacy.

## 2019-10-12 NOTE — Telephone Encounter (Signed)
Patient's son is calling to check on the status of this request. Please advise.

## 2019-10-12 NOTE — Telephone Encounter (Signed)
Requested medication (s) are due for refill today: yes  Requested medication (s) are on the active medication list: yes  Last refill:  08/17/2019  Future visit scheduled: yes  Notes to clinic:  Review for refill    Requested Prescriptions  Pending Prescriptions Disp Refills   demeclocycline (DECLOMYCIN) 300 MG tablet 60 tablet 0    Sig: Take 0.5 tablets (150 mg total) by mouth 2 (two) times daily.     Off-Protocol Failed - 10/12/2019  8:53 AM      Failed - Medication not assigned to a protocol, review manually.      Passed - Valid encounter within last 12 months    Recent Outpatient Visits          3 weeks ago Acute non-recurrent maxillary sinusitis   [facility], Lilia Argue, MD   2 months ago Vaginitis and vulvovaginitis   [facility], Lilia Argue, MD   3 months ago Hyponatremia   [facility], Lilia Argue, MD   4 months ago SIADH (syndrome of inappropriate ADH production) [facility])   Primary Care at [facility], Arlie Solomons, MD   5 months ago Hyponatremia   [facility], Lilia Argue, MD      Future Appointments            In 3 days [place], Doreene Burke, [place], [facility]   In 3 weeks Rutherford Guys, MD Primary Care at [place], [facility]            albuterol (PROVENTIL) (2.5 MG/3ML) 0.083% nebulizer solution 75 mL 5    Sig: Take 3 mLs (2.5 mg total) by nebulization every 4 (four) hours as needed for wheezing or shortness of breath.     Pulmonology:  Beta Agonists Failed - 10/12/2019  8:53 AM      Failed - One inhaler should last at least one month. If the patient is requesting refills earlier, contact the patient to check for uncontrolled symptoms.      Passed - Valid encounter within last 12 months    Recent Outpatient Visits          3 weeks ago Acute non-recurrent maxillary sinusitis   [facility], Lilia Argue, MD   2 months ago Vaginitis and vulvovaginitis   [facility], Lilia Argue, MD   3 months ago Hyponatremia   [facility], Lilia Argue, MD   4 months ago SIADH (syndrome of inappropriate ADH production) [facility])   Primary Care at [facility], Arlie Solomons, MD   5 months ago Hyponatremia   [facility], Lilia Argue, MD      Future Appointments            In 3 days [place], Doreene Burke, PA-C [place], [facility]   In 3 weeks Rutherford Guys, MD Primary Care at [place], [facility]

## 2019-10-12 NOTE — Telephone Encounter (Signed)
Copied from [place] #[id number]. Topic: General - Other >> Oct 12, 2019  8:50 AM Keene Breath wrote: Reason for CRM: Patient's son would like the nurse to call him.  He has some questions.  CB# [phone] [name])

## 2019-10-13 ENCOUNTER — Other Ambulatory Visit: Payer: Self-pay | Admitting: Family Medicine

## 2019-10-13 ENCOUNTER — Encounter: Payer: Self-pay | Admitting: Family Medicine

## 2019-10-13 ENCOUNTER — Institutional Professional Consult (permissible substitution): Payer: Medicare Other | Admitting: Pulmonary Disease

## 2019-10-13 MED ORDER — ALBUTEROL SULFATE (2.5 MG/3ML) 0.083% IN NEBU: 2.5000 mg | INHALATION_SOLUTION | RESPIRATORY_TRACT | 5 refills | Status: DC | PRN

## 2019-10-14 ENCOUNTER — Telehealth: Payer: Self-pay | Admitting: Family Medicine

## 2019-10-14 ENCOUNTER — Ambulatory Visit: Payer: Medicare Other | Admitting: Obstetrics & Gynecology

## 2019-10-14 ENCOUNTER — Encounter: Payer: Self-pay | Admitting: Obstetrics & Gynecology

## 2019-10-14 DIAGNOSIS — M545 Low back pain: Secondary | ICD-10-CM | POA: Diagnosis not present

## 2019-10-14 DIAGNOSIS — M25512 Pain in left shoulder: Secondary | ICD-10-CM | POA: Diagnosis not present

## 2019-10-14 DIAGNOSIS — I110 Hypertensive heart disease with heart failure: Secondary | ICD-10-CM | POA: Diagnosis not present

## 2019-10-14 DIAGNOSIS — E039 Hypothyroidism, unspecified: Secondary | ICD-10-CM | POA: Diagnosis not present

## 2019-10-14 DIAGNOSIS — I2721 Secondary pulmonary arterial hypertension: Secondary | ICD-10-CM | POA: Diagnosis not present

## 2019-10-14 DIAGNOSIS — K760 Fatty (change of) liver, not elsewhere classified: Secondary | ICD-10-CM | POA: Diagnosis not present

## 2019-10-14 DIAGNOSIS — E785 Hyperlipidemia, unspecified: Secondary | ICD-10-CM | POA: Diagnosis not present

## 2019-10-14 DIAGNOSIS — J0101 Acute recurrent maxillary sinusitis: Secondary | ICD-10-CM | POA: Diagnosis not present

## 2019-10-14 DIAGNOSIS — I252 Old myocardial infarction: Secondary | ICD-10-CM | POA: Diagnosis not present

## 2019-10-14 DIAGNOSIS — I5032 Chronic diastolic (congestive) heart failure: Secondary | ICD-10-CM | POA: Diagnosis not present

## 2019-10-14 DIAGNOSIS — J9611 Chronic respiratory failure with hypoxia: Secondary | ICD-10-CM | POA: Diagnosis not present

## 2019-10-14 NOTE — Telephone Encounter (Signed)
Tammy Fernandez therapist called and needs a verbal order for a nurse to come to [name] house to take a look at her, she has sore spots.  Tammy Fernandez telephone number is [phone]. She gave permission to leave a VM if she doesn't answer.

## 2019-10-15 ENCOUNTER — Ambulatory Visit: Payer: Medicare Other | Admitting: Cardiology

## 2019-10-21 NOTE — Telephone Encounter (Signed)
Verbal given 

## 2019-10-22 ENCOUNTER — Other Ambulatory Visit: Payer: Self-pay

## 2019-10-22 ENCOUNTER — Telehealth (INDEPENDENT_AMBULATORY_CARE_PROVIDER_SITE_OTHER): Payer: Medicare Other | Admitting: Family Medicine

## 2019-10-22 ENCOUNTER — Institutional Professional Consult (permissible substitution): Payer: Medicare Other | Admitting: Pulmonary Disease

## 2019-10-22 DIAGNOSIS — R197 Diarrhea, unspecified: Secondary | ICD-10-CM | POA: Diagnosis not present

## 2019-10-22 DIAGNOSIS — E871 Hypo-osmolality and hyponatremia: Secondary | ICD-10-CM

## 2019-10-22 DIAGNOSIS — E039 Hypothyroidism, unspecified: Secondary | ICD-10-CM

## 2019-10-22 NOTE — Progress Notes (Signed)
Spoke with pt son and he informed me that she needs to follow-up about labs. He states she needs her Sodium checked because he has noticed some memory loss and beleves her sodium is low. He also states she did have some diarrhea but for the last 2 days is has been better. He also would like to know if there is a smaller pill Mrs. Raider can take for the omeprazole because the current pill is hard for her to swallow.

## 2019-10-22 NOTE — Progress Notes (Signed)
Virtual Visit Note  I connected with patient on 10/22/19 at 510pm by phone and verified that I am speaking with the correct person using two identifiers. Tammy Fernandez is currently located at home and patient is currently with them during visit. The provider, Myles Lipps, MD is located in their office at time of visit.  I discussed the limitations, risks, security and privacy concerns of performing an evaluation and management service by telephone and the availability of in person appointments. I also discussed with the patient that there may be a patient responsible charge related to this service. The patient expressed understanding and agreed to proceed.   CC: diarrhea  HPI ? Had intermittent diarrhea for about past 2 weeks, non bloody Today no diarrhea Has been taking peptobismol, gas x, immodium AD as needed but none today No abd pain, but feeling queasy and no appetite No fever or chills Having problems with swallowing omeprazole capsule Feeling weak, drinking ok Her "bottom is sore" Nobody else sick Started after eating a meal cooked by someone   Allergies  Allergen Reactions  . Crestor [Rosuvastatin Calcium] Other (See Comments)    Muscle Aches  . Keflex [Cephalexin] Other (See Comments)    dizziness  . Peanut Butter Flavor Hives  . Sulfa Antibiotics Hives  . Cephalexin Other (See Comments)    dizziness  . Sulfa Drugs Cross Reactors Rash    Prior to Admission medications   Medication Sig Start Date End Date Taking? Authorizing Provider  albuterol (PROVENTIL) (2.5 MG/3ML) 0.083% nebulizer solution Take 3 mLs (2.5 mg total) by nebulization every 4 (four) hours as needed for wheezing or shortness of breath. 10/13/19  Yes Myles Lipps, MD  ALPRAZolam Prudy Feeler) 1 MG tablet TAKE 1 TABLET(1 MG) BY MOUTH TWICE DAILY AS NEEDED FOR ANXIETY 09/07/19  Yes Myles Lipps, MD  amLODipine (NORVASC) 10 MG tablet TAKE 1 TABLET(10 MG) BY MOUTH DAILY 08/17/19  Yes Myles Lipps, MD  Aspirin 81 MG EC tablet Take 81 mg by mouth daily.   Yes [provider]  cetirizine (ZYRTEC) 10 MG tablet Take 10 mg by mouth daily.   Yes [provider]  clobetasol ointment (TEMOVATE) 0.05 % Apply to affected area every night for 4 weeks, then every other day for 4 weeks and then twice a week for 4 weeks or until resolution. 09/16/19  Yes Constant, Peggy, MD  demeclocycline (DECLOMYCIN) 300 MG tablet TAKE 1/2 TABLET BY MOUTH TWICE DAILY 10/13/19  Yes Myles Lipps, MD  diclofenac sodium (VOLTAREN) 1 % GEL Apply 2 g topically 4 (four) times daily. 06/02/19  Yes Stallings, Zoe A, MD  docusate sodium (COLACE) 100 MG capsule Take 100 mg by mouth daily as needed for mild constipation.   Yes [provider]  famotidine (PEPCID) 20 MG tablet Take 1 tablet (20 mg total) by mouth at bedtime. 09/21/19  Yes Myles Lipps, MD  fluticasone (FLONASE) 50 MCG/ACT nasal spray Place 1 spray into both nostrils 2 (two) times daily. 02/16/19  Yes Myles Lipps, MD  glycerin adult 2 g suppository Place 1 suppository rectally as needed for constipation. 01/27/19  Yes Myles Lipps, MD  levothyroxine (SYNTHROID) 50 MCG tablet Take 1 tablet (50 mcg total) by mouth daily before breakfast. 06/04/19  Yes Corum, Minerva Fester, MD  meclizine (ANTIVERT) 12.5 MG tablet Take 1 tablet (12.5 mg total) by mouth 3 (three) times daily as needed for dizziness. 08/07/19  Yes Koren Shiver M,  MD  mometasone (NASONEX) 50 MCG/ACT nasal spray Place 2 sprays into the nose daily. 06/02/19  Yes Stallings, Zoe A, MD  nystatin cream (MYCOSTATIN) Apply 1 application topically 2 (two) times daily. 03/03/19  Yes Rutherford Guys, MD  olopatadine (PATANOL) 0.1 % ophthalmic solution Place 1 drop into both eyes daily as needed for allergies. 02/16/19  Yes Rutherford Guys, MD  omeprazole (PRILOSEC) 20 MG capsule Take 1 capsule (20 mg total) by mouth 2 (two) times daily before a meal. 04/03/19  Yes Rutherford Guys, MD   propranolol ER (INDERAL LA) 60 MG 24 hr capsule TAKE 1 CAPSULE(60 MG) BY MOUTH DAILY 08/17/19  Yes Rutherford Guys, MD  sertraline (ZOLOFT) 25 MG tablet TK 1 T PO D 07/10/19  Yes [provider]    Past Medical History:  Diagnosis Date  . Anxiety   . Chronic lower back pain   . Constipation    h/o fecal disimpaction on 2017  . CVA (cerebral vascular accident) ([employer]) 2015   neg workup, MRI small acute lacunar infarcts. ASA only  . Depression   . Diastolic CHF, chronic (HCC)    grade I, most recent echo Jan 2019  . Fatty liver   . GERD (gastroesophageal reflux disease)   . Hyperlipidemia   . Hypertension   . Hyponatremia    multiple episodes with encephalopathy, renal thinks SIADH  . Hypothyroidism, unspecified   . Internal hemorrhoid   . NSTEMI (non-ST elevated myocardial infarction) ([place]) 08/28/2016  . Palpitations   . PFO with atrial septal aneurysm    TTE 2015  . Pneumonia 2016  . Severe pulmonary arterial systolic hypertension ([place]) 12/25/2017   per echo, on 1L oxgen via     Past Surgical History:  Procedure Laterality Date  . ABDOMINAL HYSTERECTOMY    . CATARACT EXTRACTION W/ INTRAOCULAR LENS  IMPLANT, BILATERAL Bilateral   . CESAREAN SECTION    . TEE WITHOUT CARDIOVERSION N/A 01/19/2014   Procedure: TRANSESOPHAGEAL ECHOCARDIOGRAM (TEE);  Surgeon: Larey Dresser, MD;  Location: [facility];  Service: Cardiovascular;  Laterality: N/A;  [name]  . VAGINAL HYSTERECTOMY      Social History   Tobacco Use  . Smoking status: Never Smoker  . Smokeless tobacco: Former Systems developer    Types: Snuff  . Tobacco comment: "used snuff when I was 19"  Substance Use Topics  . Alcohol use: No    Family History  Problem Relation Age of Onset  . Stroke Father   . Hypertension Father   . Colon cancer Cousin   . Colon cancer Other        Aunt  . Diabetes Other        aunt and cousins  . Heart disease Other        cousins  . Healthy Daughter   . Healthy Son     ROS  Per hpi  Objective  Vitals as reported by the patient: none   ASSESSMENT and PLAN  1. Diarrhea, unspecified type None today. Cont to monitor - stool tests if resumes, ADAT, discussed use of butt paste, etc. RTC precautions reviewed - Stool culture; Future - C Diff by PCR; Future  2. Hyponatremia - Basic Metabolic Panel  3. Hypothyroidism, unspecified type - TSH  Ok to open omeprazole capsule and sprinkle on applesauce  FOLLOW-UP: 4 weeks   The above assessment and management plan was discussed with the patient. The patient verbalized understanding of and has agreed to the management plan.  Patient is aware to call the clinic if symptoms persist or worsen. Patient is aware when to return to the clinic for a follow-up visit. Patient educated on when it is appropriate to go to the emergency department.    I provided 20 minutes of non-face-to-face time during this encounter.  Myles LippsIrma M Santiago, MD [facility] [address] [place], [place] [id number] Ph.  [phone] Fax [phone]

## 2019-10-23 ENCOUNTER — Other Ambulatory Visit: Payer: Medicare Other | Admitting: Hospice

## 2019-10-23 ENCOUNTER — Other Ambulatory Visit: Payer: Self-pay

## 2019-10-23 DIAGNOSIS — J9611 Chronic respiratory failure with hypoxia: Secondary | ICD-10-CM | POA: Diagnosis not present

## 2019-10-23 DIAGNOSIS — J0101 Acute recurrent maxillary sinusitis: Secondary | ICD-10-CM | POA: Diagnosis not present

## 2019-10-23 DIAGNOSIS — K760 Fatty (change of) liver, not elsewhere classified: Secondary | ICD-10-CM | POA: Diagnosis not present

## 2019-10-23 DIAGNOSIS — E039 Hypothyroidism, unspecified: Secondary | ICD-10-CM | POA: Diagnosis not present

## 2019-10-23 DIAGNOSIS — E785 Hyperlipidemia, unspecified: Secondary | ICD-10-CM | POA: Diagnosis not present

## 2019-10-23 DIAGNOSIS — I5032 Chronic diastolic (congestive) heart failure: Secondary | ICD-10-CM | POA: Diagnosis not present

## 2019-10-23 DIAGNOSIS — M25512 Pain in left shoulder: Secondary | ICD-10-CM | POA: Diagnosis not present

## 2019-10-23 DIAGNOSIS — Z515 Encounter for palliative care: Secondary | ICD-10-CM | POA: Diagnosis not present

## 2019-10-23 DIAGNOSIS — M545 Low back pain: Secondary | ICD-10-CM | POA: Diagnosis not present

## 2019-10-23 DIAGNOSIS — I110 Hypertensive heart disease with heart failure: Secondary | ICD-10-CM | POA: Diagnosis not present

## 2019-10-23 DIAGNOSIS — I2721 Secondary pulmonary arterial hypertension: Secondary | ICD-10-CM | POA: Diagnosis not present

## 2019-10-23 DIAGNOSIS — I252 Old myocardial infarction: Secondary | ICD-10-CM | POA: Diagnosis not present

## 2019-10-23 NOTE — Progress Notes (Addendum)
Therapist, nutritional Palliative Care Consult Note Telephone: [phone]  Fax: [phone]  PATIENT NAME: Tammy Fernandez DOB: Nov 29, 1924 MRN: [id number]  PRIMARY CARE PROVIDER:   Myles Lipps, MD  REFERRING PROVIDER:  Myles Lipps, MD [address] [place],  [place] [address]  RESPONSIBLE PARTY:   RESPONSIBLE PARTY:[name] is HCPOA.Mare Ferrari, son, lives at home with [phone]  RECOMMENDATIONS/PLAN:   Advance Care Planning/Goals of Care: Patient remains a full code.  Goals of care include to maximize quality of life and symptom management. Symptom management: Diarrhea: patient said no diarrhea yesterday and one bowel movement today. She explained that she had diarrhea about five days ago and she was given Imodium 2 tabs that day and then another two tabs the next day as diarrhea continued. She felt diarrhea started after she received double doses of her medication for constipation. Education provided to patient, [name] and Annice Pih on proper dosing of Imodium, 2 tabs for first time diarrhea is established, I tab following each diarrhea, not exceeding 16mg  a day. Also advised to back off the laxatives/stool softner and resume later at once daily instead of two times as patient complained. Soreness to sacrum likely related to sitting so long, no sacral redness or open wound; she said she uses vaseline as barrier, while [name] said he would pick up a barrier cream PCP recommended. Ample education provided on position changes, walking as tolerated to relieve pressure and prevent pressure wounds. Patient and [name] verbalized understanding. Annice Pih said he has appointment set for next week with PCP for lab work and review of patient's sodium level.  Follow up: Palliative care will continue to follow patient for goals of care clarification and symptom management  I spent 30 minutes providing this consultation,  from 10.45am to 11.15am.  More than 50%  of the time in this consultation was spent coordinating communication.   HISTORY OF PRESENT ILLNESS:Tammy L Godboltis a 89year oldfemalewith multiple medical problems including intermittent diarrhea, pulmonary hypertension,  Chronic diastolic CHF, OSA, SIADH. Palliative Care was asked to help address goals of care.   CODE STATUS: Full  PPS: 50% HOSPICE ELIGIBILITY/DIAGNOSIS: TBD  PAST MEDICAL HISTORY:  Past Medical History:  Diagnosis Date  . Anxiety   . Chronic lower back pain   . Constipation    h/o fecal disimpaction on 2017  . CVA (cerebral vascular accident) (HCC) 2015   neg workup, MRI small acute lacunar infarcts. ASA only  . Depression   . Diastolic CHF, chronic (HCC)    grade I, most recent echo Jan 2019  . Fatty liver   . GERD (gastroesophageal reflux disease)   . Hyperlipidemia   . Hypertension   . Hyponatremia    multiple episodes with encephalopathy, renal thinks SIADH  . Hypothyroidism, unspecified   . Internal hemorrhoid   . NSTEMI (non-ST elevated myocardial infarction) (HCC) 08/28/2016  . Palpitations   . PFO with atrial septal aneurysm    TTE 2015  . Pneumonia 2016  . Severe pulmonary arterial systolic hypertension (HCC) 12/25/2017   per echo, on 1L oxgen via [place]    SOCIAL HX:  Social History   Tobacco Use  . Smoking status: Never Smoker  . Smokeless tobacco: Former 12/27/2017    Types: Snuff  . Tobacco comment: "used snuff when I was 19"  Substance Use Topics  . Alcohol use: No    ALLERGIES:  Allergies  Allergen Reactions  . Crestor [Rosuvastatin Calcium] Other (See Comments)  Muscle Aches  . Keflex [Cephalexin] Other (See Comments)    dizziness  . Peanut Butter Flavor Hives  . Sulfa Antibiotics Hives  . Cephalexin Other (See Comments)    dizziness  . Sulfa Drugs Cross Reactors Rash     PERTINENT MEDICATIONS:  Outpatient Encounter Medications as of 10/23/2019  Medication Sig  . albuterol (PROVENTIL) (2.5 MG/3ML) 0.083% nebulizer  solution Take 3 mLs (2.5 mg total) by nebulization every 4 (four) hours as needed for wheezing or shortness of breath.  . ALPRAZolam (XANAX) 1 MG tablet TAKE 1 TABLET(1 MG) BY MOUTH TWICE DAILY AS NEEDED FOR ANXIETY  . amLODipine (NORVASC) 10 MG tablet TAKE 1 TABLET(10 MG) BY MOUTH DAILY  . Aspirin 81 MG EC tablet Take 81 mg by mouth daily.  . cetirizine (ZYRTEC) 10 MG tablet Take 10 mg by mouth daily.  . clobetasol ointment (TEMOVATE) 0.05 % Apply to affected area every night for 4 weeks, then every other day for 4 weeks and then twice a week for 4 weeks or until resolution.  Marland Kitchen demeclocycline (DECLOMYCIN) 300 MG tablet TAKE 1/2 TABLET BY MOUTH TWICE DAILY  . diclofenac sodium (VOLTAREN) 1 % GEL Apply 2 g topically 4 (four) times daily.  Marland Kitchen docusate sodium (COLACE) 100 MG capsule Take 100 mg by mouth daily as needed for mild constipation.  . famotidine (PEPCID) 20 MG tablet Take 1 tablet (20 mg total) by mouth at bedtime.  . fluticasone (FLONASE) 50 MCG/ACT nasal spray Place 1 spray into both nostrils 2 (two) times daily.  Marland Kitchen glycerin adult 2 g suppository Place 1 suppository rectally as needed for constipation.  Marland Kitchen levothyroxine (SYNTHROID) 50 MCG tablet Take 1 tablet (50 mcg total) by mouth daily before breakfast.  . meclizine (ANTIVERT) 12.5 MG tablet Take 1 tablet (12.5 mg total) by mouth 3 (three) times daily as needed for dizziness.  . mometasone (NASONEX) 50 MCG/ACT nasal spray Place 2 sprays into the nose daily.  Marland Kitchen nystatin cream (MYCOSTATIN) Apply 1 application topically 2 (two) times daily.  Marland Kitchen olopatadine (PATANOL) 0.1 % ophthalmic solution Place 1 drop into both eyes daily as needed for allergies.  Marland Kitchen omeprazole (PRILOSEC) 20 MG capsule Take 1 capsule (20 mg total) by mouth 2 (two) times daily before a meal.  . propranolol ER (INDERAL LA) 60 MG 24 hr capsule TAKE 1 CAPSULE(60 MG) BY MOUTH DAILY  . sertraline (ZOLOFT) 25 MG tablet TK 1 T PO D   No facility-administered encounter  medications on file as of 10/23/2019.     PHYSICAL EXAM/ROS:   General: cooperative, in no acute distress. Cardiovascular: denies chest pain Pulmonary: clear ant fields, no adventitious lung sounds, no SOB,  Abdomen: soft, nontender, + bowel sounds, no yesterday and diarrhea today Extremities: no edema, no joint deformities Skin: no rashes/wound to exposed skin, says bottom is sore Neurological: Weakness but otherwise nonfocal  Teodoro Spray, NP

## 2019-10-25 DIAGNOSIS — K760 Fatty (change of) liver, not elsewhere classified: Secondary | ICD-10-CM | POA: Diagnosis not present

## 2019-10-25 DIAGNOSIS — E039 Hypothyroidism, unspecified: Secondary | ICD-10-CM | POA: Diagnosis not present

## 2019-10-25 DIAGNOSIS — J9611 Chronic respiratory failure with hypoxia: Secondary | ICD-10-CM | POA: Diagnosis not present

## 2019-10-25 DIAGNOSIS — I252 Old myocardial infarction: Secondary | ICD-10-CM | POA: Diagnosis not present

## 2019-10-25 DIAGNOSIS — M545 Low back pain: Secondary | ICD-10-CM | POA: Diagnosis not present

## 2019-10-25 DIAGNOSIS — E785 Hyperlipidemia, unspecified: Secondary | ICD-10-CM | POA: Diagnosis not present

## 2019-10-25 DIAGNOSIS — J0101 Acute recurrent maxillary sinusitis: Secondary | ICD-10-CM | POA: Diagnosis not present

## 2019-10-25 DIAGNOSIS — M25512 Pain in left shoulder: Secondary | ICD-10-CM | POA: Diagnosis not present

## 2019-10-25 DIAGNOSIS — I5032 Chronic diastolic (congestive) heart failure: Secondary | ICD-10-CM | POA: Diagnosis not present

## 2019-10-25 DIAGNOSIS — I2721 Secondary pulmonary arterial hypertension: Secondary | ICD-10-CM | POA: Diagnosis not present

## 2019-10-25 DIAGNOSIS — I110 Hypertensive heart disease with heart failure: Secondary | ICD-10-CM | POA: Diagnosis not present

## 2019-10-26 DIAGNOSIS — I110 Hypertensive heart disease with heart failure: Secondary | ICD-10-CM | POA: Diagnosis not present

## 2019-10-26 DIAGNOSIS — M25512 Pain in left shoulder: Secondary | ICD-10-CM | POA: Diagnosis not present

## 2019-10-26 DIAGNOSIS — I2721 Secondary pulmonary arterial hypertension: Secondary | ICD-10-CM | POA: Diagnosis not present

## 2019-10-26 DIAGNOSIS — J9611 Chronic respiratory failure with hypoxia: Secondary | ICD-10-CM | POA: Diagnosis not present

## 2019-10-26 DIAGNOSIS — I5032 Chronic diastolic (congestive) heart failure: Secondary | ICD-10-CM | POA: Diagnosis not present

## 2019-10-26 DIAGNOSIS — I252 Old myocardial infarction: Secondary | ICD-10-CM | POA: Diagnosis not present

## 2019-10-26 DIAGNOSIS — J0101 Acute recurrent maxillary sinusitis: Secondary | ICD-10-CM | POA: Diagnosis not present

## 2019-10-26 DIAGNOSIS — E785 Hyperlipidemia, unspecified: Secondary | ICD-10-CM | POA: Diagnosis not present

## 2019-10-26 DIAGNOSIS — M545 Low back pain: Secondary | ICD-10-CM | POA: Diagnosis not present

## 2019-10-26 DIAGNOSIS — E039 Hypothyroidism, unspecified: Secondary | ICD-10-CM | POA: Diagnosis not present

## 2019-10-26 DIAGNOSIS — K760 Fatty (change of) liver, not elsewhere classified: Secondary | ICD-10-CM | POA: Diagnosis not present

## 2019-10-28 ENCOUNTER — Ambulatory Visit: Payer: Medicare Other | Admitting: Cardiology

## 2019-10-31 DIAGNOSIS — I252 Old myocardial infarction: Secondary | ICD-10-CM | POA: Diagnosis not present

## 2019-10-31 DIAGNOSIS — R7881 Bacteremia: Secondary | ICD-10-CM | POA: Diagnosis not present

## 2019-10-31 DIAGNOSIS — I6789 Other cerebrovascular disease: Secondary | ICD-10-CM | POA: Diagnosis not present

## 2019-10-31 DIAGNOSIS — K760 Fatty (change of) liver, not elsewhere classified: Secondary | ICD-10-CM | POA: Diagnosis not present

## 2019-10-31 DIAGNOSIS — J9611 Chronic respiratory failure with hypoxia: Secondary | ICD-10-CM | POA: Diagnosis not present

## 2019-10-31 DIAGNOSIS — I2721 Secondary pulmonary arterial hypertension: Secondary | ICD-10-CM | POA: Diagnosis not present

## 2019-10-31 DIAGNOSIS — I5032 Chronic diastolic (congestive) heart failure: Secondary | ICD-10-CM | POA: Diagnosis not present

## 2019-10-31 DIAGNOSIS — M25512 Pain in left shoulder: Secondary | ICD-10-CM | POA: Diagnosis not present

## 2019-10-31 DIAGNOSIS — I110 Hypertensive heart disease with heart failure: Secondary | ICD-10-CM | POA: Diagnosis not present

## 2019-10-31 DIAGNOSIS — M545 Low back pain: Secondary | ICD-10-CM | POA: Diagnosis not present

## 2019-10-31 DIAGNOSIS — J9601 Acute respiratory failure with hypoxia: Secondary | ICD-10-CM | POA: Diagnosis not present

## 2019-10-31 DIAGNOSIS — E039 Hypothyroidism, unspecified: Secondary | ICD-10-CM | POA: Diagnosis not present

## 2019-10-31 DIAGNOSIS — J441 Chronic obstructive pulmonary disease with (acute) exacerbation: Secondary | ICD-10-CM | POA: Diagnosis not present

## 2019-10-31 DIAGNOSIS — J0101 Acute recurrent maxillary sinusitis: Secondary | ICD-10-CM | POA: Diagnosis not present

## 2019-10-31 DIAGNOSIS — E785 Hyperlipidemia, unspecified: Secondary | ICD-10-CM | POA: Diagnosis not present

## 2019-11-02 ENCOUNTER — Telehealth: Payer: Self-pay

## 2019-11-02 DIAGNOSIS — B9562 Methicillin resistant Staphylococcus aureus infection as the cause of diseases classified elsewhere: Secondary | ICD-10-CM | POA: Diagnosis not present

## 2019-11-02 DIAGNOSIS — R7881 Bacteremia: Secondary | ICD-10-CM | POA: Diagnosis not present

## 2019-11-02 DIAGNOSIS — J9601 Acute respiratory failure with hypoxia: Secondary | ICD-10-CM | POA: Diagnosis not present

## 2019-11-02 DIAGNOSIS — I6789 Other cerebrovascular disease: Secondary | ICD-10-CM | POA: Diagnosis not present

## 2019-11-02 NOTE — Telephone Encounter (Signed)
Pt.'s son called wanting to let Dr. Pamella Pert know that his mother had been falling asleep while eating.

## 2019-11-03 DIAGNOSIS — I252 Old myocardial infarction: Secondary | ICD-10-CM | POA: Diagnosis not present

## 2019-11-03 DIAGNOSIS — I2721 Secondary pulmonary arterial hypertension: Secondary | ICD-10-CM | POA: Diagnosis not present

## 2019-11-03 DIAGNOSIS — M545 Low back pain: Secondary | ICD-10-CM | POA: Diagnosis not present

## 2019-11-03 DIAGNOSIS — J0101 Acute recurrent maxillary sinusitis: Secondary | ICD-10-CM | POA: Diagnosis not present

## 2019-11-03 DIAGNOSIS — M25512 Pain in left shoulder: Secondary | ICD-10-CM | POA: Diagnosis not present

## 2019-11-03 DIAGNOSIS — E785 Hyperlipidemia, unspecified: Secondary | ICD-10-CM | POA: Diagnosis not present

## 2019-11-03 DIAGNOSIS — I110 Hypertensive heart disease with heart failure: Secondary | ICD-10-CM | POA: Diagnosis not present

## 2019-11-03 DIAGNOSIS — I5032 Chronic diastolic (congestive) heart failure: Secondary | ICD-10-CM | POA: Diagnosis not present

## 2019-11-03 DIAGNOSIS — E039 Hypothyroidism, unspecified: Secondary | ICD-10-CM | POA: Diagnosis not present

## 2019-11-03 DIAGNOSIS — K760 Fatty (change of) liver, not elsewhere classified: Secondary | ICD-10-CM | POA: Diagnosis not present

## 2019-11-03 DIAGNOSIS — J9611 Chronic respiratory failure with hypoxia: Secondary | ICD-10-CM | POA: Diagnosis not present

## 2019-11-03 NOTE — Telephone Encounter (Signed)
[facility]   Reporting findings  Bad indigestion  Worse at night  When dr changed medication.  Has weezing   Nurse asking if a mobile x-ray can be done   Please advise   Permission to make recommendations for wound   Please Advise   [phone] [facility]

## 2019-11-05 ENCOUNTER — Ambulatory Visit (INDEPENDENT_AMBULATORY_CARE_PROVIDER_SITE_OTHER): Payer: Medicare Other

## 2019-11-05 ENCOUNTER — Ambulatory Visit (INDEPENDENT_AMBULATORY_CARE_PROVIDER_SITE_OTHER): Payer: Medicare Other | Admitting: Family Medicine

## 2019-11-05 ENCOUNTER — Institutional Professional Consult (permissible substitution): Payer: Medicare Other | Admitting: Pulmonary Disease

## 2019-11-05 ENCOUNTER — Encounter: Payer: Self-pay | Admitting: Family Medicine

## 2019-11-05 ENCOUNTER — Other Ambulatory Visit: Payer: Self-pay

## 2019-11-05 VITALS — BP 136/82 | HR 60 | Temp 98.0°F | Ht 62.0 in | Wt 158.0 lb

## 2019-11-05 DIAGNOSIS — E871 Hypo-osmolality and hyponatremia: Secondary | ICD-10-CM | POA: Diagnosis not present

## 2019-11-05 DIAGNOSIS — R062 Wheezing: Secondary | ICD-10-CM | POA: Diagnosis not present

## 2019-11-05 DIAGNOSIS — E039 Hypothyroidism, unspecified: Secondary | ICD-10-CM

## 2019-11-05 DIAGNOSIS — R05 Cough: Secondary | ICD-10-CM

## 2019-11-05 MED ORDER — SUCRALFATE 1 G PO TABS: 1.0000 g | ORAL_TABLET | Freq: Every day | ORAL | 2 refills | Status: DC

## 2019-11-05 MED ORDER — IPRATROPIUM BROMIDE 0.03 % NA SOLN: 2.0000 | Freq: Two times a day (BID) | NASAL | 0 refills | Status: DC

## 2019-11-05 NOTE — Patient Instructions (Signed)
° ° ° °  If you have lab work done today you will be contacted with your lab results within the next 2 weeks.  If you have not heard from us then please contact us. The fastest way to get your results is to register for My Chart. ° ° °IF you received an x-ray today, you will receive an invoice from [facility]. Please contact [facility] at [phone] with questions or concerns regarding your invoice.  ° °IF you received labwork today, you will receive an invoice from [facility]. Please contact [facility] at [phone] with questions or concerns regarding your invoice.  ° °Our billing staff will not be able to assist you with questions regarding bills from these companies. ° °You will be contacted with the lab results as soon as they are available. The fastest way to get your results is to activate your My Chart account. Instructions are located on the last page of this paperwork. If you have not heard from us regarding the results in 2 weeks, please contact this office. °  ° ° ° °

## 2019-11-05 NOTE — Progress Notes (Signed)
12/3/20202:25 PM  Tammy Fernandez 07/18/1924, 85 y.o., female [id number]  Chief Complaint  Patient presents with  . Follow-up    allerg and wheezing, productive coughing, was told to stop taking pepcid due to diarrhea. The diarrhea has stopped. Wants to know if she is eligible for cologuard    HPI:   Patient is a 85 y.o. female with past medical history significant forhyponatremia2/2 SIADH, constipation, HTN,dCHF,severepHTN, chronic respiratory failureon oxygen, CVA, GERD and seasonal allergies who presents today for several concerns  Mostly coughing with occasional wheezing at night not during the day with reflux despite sleeping with elevated HOB  Swallow study in June 2020 - no aspiration but did have sign esophageal dysphagia  Stopped pepcid as it was causing diarrhea now back on omeprazole 20mg  AM and 40mg  at bedtime for past week No improvement in symptoms  Feels she constantly has PND flonase not helping No fever, chills, SOB, chest tightness, palpitations, edema  Saw renal, cont declomycin and salt tabs. Increased fluid intake  Depression screen [facility] 2/9 11/05/2019 10/22/2019 09/21/2019  Decreased Interest 0 0 0  Down, Depressed, Hopeless 0 0 0  PHQ - 2 Score 0 0 0  Altered sleeping - - -  Tired, decreased energy - - -  Change in appetite - - -  Feeling bad or failure about yourself  - - -  Trouble concentrating - - -  Moving slowly or fidgety/restless - - -  Suicidal thoughts - - -  PHQ-9 Score - - -  Difficult doing work/chores - - -  Some recent data might be hidden    Fall Risk  11/05/2019 10/22/2019 09/21/2019 09/21/2019 08/07/2019  Falls in the past year? 0 0 0 0 0  Number falls in past yr: 0 0 0 0 0  Injury with Fall? 0 0 0 0 0  Risk for fall due to : - - - Impaired balance/gait;Impaired mobility -  Risk for fall due to: Comment - - - - -  Follow up - - - Falls prevention discussed;Falls evaluation completed;Education provided -     Allergies   Allergen Reactions  . Crestor [Rosuvastatin Calcium] Other (See Comments)    Muscle Aches  . Keflex [Cephalexin] Other (See Comments)    dizziness  . Peanut Butter Flavor Hives  . Sulfa Antibiotics Hives  . Cephalexin Other (See Comments)    dizziness  . Sulfa Drugs Cross Reactors Rash    Prior to Admission medications   Medication Sig Start Date End Date Taking? Authorizing Provider  albuterol (PROVENTIL) (2.5 MG/3ML) 0.083% nebulizer solution Take 3 mLs (2.5 mg total) by nebulization every 4 (four) hours as needed for wheezing or shortness of breath. 10/13/19  Yes 10/07/2019, MD  ALPRAZolam 13/10/20) 1 MG tablet TAKE 1 TABLET(1 MG) BY MOUTH TWICE DAILY AS NEEDED FOR ANXIETY 09/07/19  Yes Prudy Feeler, MD  amLODipine (NORVASC) 10 MG tablet TAKE 1 TABLET(10 MG) BY MOUTH DAILY 08/17/19  Yes Myles Lipps, MD  Aspirin 81 MG EC tablet Take 81 mg by mouth daily.   Yes [provider]  cetirizine (ZYRTEC) 10 MG tablet Take 10 mg by mouth daily.   Yes [provider]  clobetasol ointment (TEMOVATE) 0.05 % Apply to affected area every night for 4 weeks, then every other day for 4 weeks and then twice a week for 4 weeks or until resolution. 09/16/19  Yes Constant, Peggy, MD  demeclocycline (DECLOMYCIN) 300 MG tablet TAKE 1/2 TABLET BY  MOUTH TWICE DAILY 10/13/19  Yes Myles LippsSantiago,  M, MD  diclofenac sodium (VOLTAREN) 1 % GEL Apply 2 g topically 4 (four) times daily. 06/02/19  Yes Stallings, Zoe A, MD  docusate sodium (COLACE) 100 MG capsule Take 100 mg by mouth daily as needed for mild constipation.   Yes [provider]  famotidine (PEPCID) 20 MG tablet Take 1 tablet (20 mg total) by mouth at bedtime. 09/21/19  Yes Myles LippsSantiago,  M, MD  fluticasone (FLONASE) 50 MCG/ACT nasal spray Place 1 spray into both nostrils 2 (two) times daily. 02/16/19  Yes Myles LippsSantiago,  M, MD  glycerin adult 2 g suppository Place 1 suppository rectally as needed for constipation. 01/27/19   Yes Myles LippsSantiago,  M, MD  levothyroxine (SYNTHROID) 50 MCG tablet Take 1 tablet (50 mcg total) by mouth daily before breakfast. 06/04/19  Yes Corum, Minerva FesterLisa L, MD  meclizine (ANTIVERT) 12.5 MG tablet Take 1 tablet (12.5 mg total) by mouth 3 (three) times daily as needed for dizziness. 08/07/19  Yes Myles LippsSantiago,  M, MD  mometasone (NASONEX) 50 MCG/ACT nasal spray Place 2 sprays into the nose daily. 06/02/19  Yes Stallings, Zoe A, MD  nystatin cream (MYCOSTATIN) Apply 1 application topically 2 (two) times daily. 03/03/19  Yes Myles LippsSantiago,  M, MD  olopatadine (PATANOL) 0.1 % ophthalmic solution Place 1 drop into both eyes daily as needed for allergies. 02/16/19  Yes Myles LippsSantiago,  M, MD  omeprazole (PRILOSEC) 20 MG capsule Take 1 capsule (20 mg total) by mouth 2 (two) times daily before a meal. 04/03/19  Yes Myles LippsSantiago,  M, MD  propranolol ER (INDERAL LA) 60 MG 24 hr capsule TAKE 1 CAPSULE(60 MG) BY MOUTH DAILY 08/17/19  Yes Myles LippsSantiago,  M, MD  sertraline (ZOLOFT) 25 MG tablet TK 1 T PO D 07/10/19  Yes [provider]    Past Medical History:  Diagnosis Date  . Anxiety   . Chronic lower back pain   . Constipation    h/o fecal disimpaction on 2017  . CVA (cerebral vascular accident) (HCC) 2015   neg workup, MRI small acute lacunar infarcts. ASA only  . Depression   . Diastolic CHF, chronic (HCC)    grade I, most recent echo Jan 2019  . Fatty liver   . GERD (gastroesophageal reflux disease)   . Hyperlipidemia   . Hypertension   . Hyponatremia    multiple episodes with encephalopathy, renal thinks SIADH  . Hypothyroidism, unspecified   . Internal hemorrhoid   . NSTEMI (non-ST elevated myocardial infarction) (HCC) 08/28/2016  . Palpitations   . PFO with atrial septal aneurysm    TTE 2015  . Pneumonia 2016  . Severe pulmonary arterial systolic hypertension (HCC) 12/25/2017   per echo, on 1L oxgen via [place]    Past Surgical History:  Procedure Laterality Date  . ABDOMINAL HYSTERECTOMY     . CATARACT EXTRACTION W/ INTRAOCULAR LENS  IMPLANT, BILATERAL Bilateral   . CESAREAN SECTION    . TEE WITHOUT CARDIOVERSION N/A 01/19/2014   Procedure: TRANSESOPHAGEAL ECHOCARDIOGRAM (TEE);  Surgeon: Laurey Moralealton S McLean, MD;  [facility];  Service: Cardiovascular;  Laterality: N/A;  [name]/ja  . VAGINAL HYSTERECTOMY      Social History   Tobacco Use  . Smoking status: Never Smoker  . Smokeless tobacco: Former NeurosurgeonUser    Types: Snuff  . Tobacco comment: "used snuff when I was 19"  Substance Use Topics  . Alcohol use: No    Family History  Problem Relation Age of  Onset  . Stroke Father   . Hypertension Father   . Colon cancer Cousin   . Colon cancer Other        Aunt  . Diabetes Other        aunt and cousins  . Heart disease Other        cousins  . Healthy Daughter   . Healthy Son     ROS Per hpi  OBJECTIVE:  Today's Vitals   11/05/19 1405  BP: 136/82  Pulse: 60  Temp: 98 F (36.7 C)  Weight: 158 lb (71.7 kg)  Height: 5\' 2"  (1.575 m)   Body mass index is 28.9 kg/m.   Wt Readings from Last 3 Encounters:  11/05/19 158 lb (71.7 kg)  09/16/19 162 lb (73.5 kg)  06/02/19 170 lb (77.1 kg)     Physical Exam Vitals signs and nursing note reviewed.  Constitutional:      Appearance: She is well-developed.  HENT:     Head: Normocephalic and atraumatic.     Mouth/Throat:     Pharynx: No oropharyngeal exudate.  Eyes:     General: No scleral icterus.    Conjunctiva/sclera: Conjunctivae normal.     Pupils: Pupils are equal, round, and reactive to light.  Neck:     Musculoskeletal: Neck supple.  Cardiovascular:     Rate and Rhythm: Normal rate and regular rhythm.     Heart sounds: Normal heart sounds. No murmur. No friction rub. No gallop.   Pulmonary:     Effort: Pulmonary effort is normal.     Breath sounds: Normal breath sounds. No wheezing, rhonchi or rales.     Comments: On Oxygen Musculoskeletal:     Right lower leg: No edema.     Left lower leg:  No edema.  Skin:    General: Skin is warm and dry.  Neurological:     Mental Status: She is alert and oriented to person, place, and time.     No results found for this or any previous visit (from the past 24 hour(s)).  Dg Chest 2 View  Result Date: 11/05/2019 CLINICAL DATA:  Cough. EXAM: CHEST - 2 VIEW COMPARISON:  February 13, 2019. FINDINGS: Stable cardiomediastinal silhouette. Atherosclerosis of thoracic aorta is noted. No pneumothorax is noted. Stable elevated right hemidiaphragm is noted with mild right basilar subsegmental atelectasis. Stable left basilar subsegmental atelectasis or scarring is noted. Bony thorax is unremarkable IMPRESSION: Aortic atherosclerosis. Stable elevated right hemidiaphragm with mild right basilar subsegmental atelectasis. Stable left basilar subsegmental atelectasis or scarring. Electronically Signed   By: Marijo Conception M.D.   On: 11/05/2019 15:16     ASSESSMENT and PLAN  1. Nocturnal cough with wheeze gerd most likely etiology, adding carafate at bedtime. Trial of nasal ipratropium for chronic rhinitis.  RTC precautions reviewed. - DG Chest 2 View; Future  2. Hyponatremia - Comprehensive metabolic panel  3. Hypothyroidism, unspecified type - TSH  Other orders - ipratropium (ATROVENT) 0.03 % nasal spray; Place 2 sprays into both nostrils 2 (two) times daily. - sucralfate (CARAFATE) 1 g tablet; Take 1 tablet (1 g total) by mouth at bedtime.  Return in about 4 weeks (around 12/03/2019).    Rutherford Guys, MD [facility] at [facility] [place], [place] [address] Ph.  [phone] Fax [phone]

## 2019-11-06 DIAGNOSIS — I5032 Chronic diastolic (congestive) heart failure: Secondary | ICD-10-CM | POA: Diagnosis not present

## 2019-11-06 DIAGNOSIS — M25512 Pain in left shoulder: Secondary | ICD-10-CM | POA: Diagnosis not present

## 2019-11-06 DIAGNOSIS — K760 Fatty (change of) liver, not elsewhere classified: Secondary | ICD-10-CM | POA: Diagnosis not present

## 2019-11-06 DIAGNOSIS — I110 Hypertensive heart disease with heart failure: Secondary | ICD-10-CM | POA: Diagnosis not present

## 2019-11-06 DIAGNOSIS — E785 Hyperlipidemia, unspecified: Secondary | ICD-10-CM | POA: Diagnosis not present

## 2019-11-06 DIAGNOSIS — I2721 Secondary pulmonary arterial hypertension: Secondary | ICD-10-CM | POA: Diagnosis not present

## 2019-11-06 DIAGNOSIS — J0101 Acute recurrent maxillary sinusitis: Secondary | ICD-10-CM | POA: Diagnosis not present

## 2019-11-06 DIAGNOSIS — I252 Old myocardial infarction: Secondary | ICD-10-CM | POA: Diagnosis not present

## 2019-11-06 DIAGNOSIS — M545 Low back pain: Secondary | ICD-10-CM | POA: Diagnosis not present

## 2019-11-06 DIAGNOSIS — E039 Hypothyroidism, unspecified: Secondary | ICD-10-CM | POA: Diagnosis not present

## 2019-11-06 DIAGNOSIS — J9611 Chronic respiratory failure with hypoxia: Secondary | ICD-10-CM | POA: Diagnosis not present

## 2019-11-06 LAB — COMPREHENSIVE METABOLIC PANEL
ALT: 10 IU/L (ref 0–32)
AST: 21 IU/L (ref 0–40)
Albumin/Globulin Ratio: 1.2 (ref 1.2–2.2)
Albumin: 3.6 g/dL (ref 3.5–4.6)
Alkaline Phosphatase: 91 IU/L (ref 39–117)
BUN/Creatinine Ratio: 11 — ABNORMAL LOW (ref 12–28)
BUN: 6 mg/dL — ABNORMAL LOW (ref 10–36)
Bilirubin Total: 0.3 mg/dL (ref 0.0–1.2)
CO2: 31 mmol/L — ABNORMAL HIGH (ref 20–29)
Calcium: 10.1 mg/dL (ref 8.7–10.3)
Chloride: 80 mmol/L — ABNORMAL LOW (ref 96–106)
Creatinine, Ser: 0.55 mg/dL — ABNORMAL LOW (ref 0.57–1.00)
GFR calc Af Amer: 94 mL/min/{1.73_m2} (ref >59)
GFR calc non Af Amer: 81 mL/min/{1.73_m2} (ref >59)
Globulin, Total: 3.1 g/dL (ref 1.5–4.5)
Glucose: 89 mg/dL (ref 65–99)
Potassium: 4.8 mmol/L (ref 3.5–5.2)
Sodium: 125 mmol/L — ABNORMAL LOW (ref 134–144)
Total Protein: 6.7 g/dL (ref 6.0–8.5)

## 2019-11-06 LAB — TSH: TSH: 1.07 u[IU]/mL (ref 0.450–4.500)

## 2019-11-06 NOTE — Addendum Note (Signed)
Addended by: Rutherford Guys on: 11/06/2019 09:14 AM   Modules accepted: Orders

## 2019-11-07 ENCOUNTER — Other Ambulatory Visit: Payer: Self-pay | Admitting: Family Medicine

## 2019-11-09 ENCOUNTER — Telehealth: Payer: Self-pay | Admitting: Family Medicine

## 2019-11-09 DIAGNOSIS — J9611 Chronic respiratory failure with hypoxia: Secondary | ICD-10-CM | POA: Diagnosis not present

## 2019-11-09 DIAGNOSIS — I110 Hypertensive heart disease with heart failure: Secondary | ICD-10-CM | POA: Diagnosis not present

## 2019-11-09 DIAGNOSIS — I2721 Secondary pulmonary arterial hypertension: Secondary | ICD-10-CM | POA: Diagnosis not present

## 2019-11-09 DIAGNOSIS — E785 Hyperlipidemia, unspecified: Secondary | ICD-10-CM | POA: Diagnosis not present

## 2019-11-09 DIAGNOSIS — I252 Old myocardial infarction: Secondary | ICD-10-CM | POA: Diagnosis not present

## 2019-11-09 DIAGNOSIS — M25512 Pain in left shoulder: Secondary | ICD-10-CM | POA: Diagnosis not present

## 2019-11-09 DIAGNOSIS — J0101 Acute recurrent maxillary sinusitis: Secondary | ICD-10-CM | POA: Diagnosis not present

## 2019-11-09 DIAGNOSIS — I5032 Chronic diastolic (congestive) heart failure: Secondary | ICD-10-CM | POA: Diagnosis not present

## 2019-11-09 DIAGNOSIS — E039 Hypothyroidism, unspecified: Secondary | ICD-10-CM | POA: Diagnosis not present

## 2019-11-09 DIAGNOSIS — M545 Low back pain: Secondary | ICD-10-CM | POA: Diagnosis not present

## 2019-11-09 DIAGNOSIS — K760 Fatty (change of) liver, not elsewhere classified: Secondary | ICD-10-CM | POA: Diagnosis not present

## 2019-11-09 NOTE — Telephone Encounter (Signed)
Copied from [place] [id number]. Topic: General - Other >> Nov 09, 2019 12:25 PM Sheran Luz wrote: Clarise Cruz with [facility], calling requesting to speak with CMA about patients medication.

## 2019-11-10 NOTE — Telephone Encounter (Signed)
Tammy Fernandez with [facility] says it is very important she speak with someone asap. Several issues, medication, need wound orders, fluid restrictions.  Wound care is the most urgent, a skin tear that has gone to a stage 2.  [phone]

## 2019-11-10 NOTE — Telephone Encounter (Signed)
Dr Santiago advise please 

## 2019-11-10 NOTE — Telephone Encounter (Signed)
Please call HH: Seen in clinic. CXR done - normal. Added carafate at bedtime. Wound care recommendations appreciated. Thanks

## 2019-11-11 ENCOUNTER — Other Ambulatory Visit: Payer: Medicare Other | Admitting: Hospice

## 2019-11-11 ENCOUNTER — Telehealth: Payer: Self-pay | Admitting: Family Medicine

## 2019-11-11 ENCOUNTER — Other Ambulatory Visit: Payer: Self-pay

## 2019-11-11 ENCOUNTER — Other Ambulatory Visit: Payer: Self-pay | Admitting: Hospice

## 2019-11-11 DIAGNOSIS — M545 Low back pain: Secondary | ICD-10-CM | POA: Diagnosis not present

## 2019-11-11 DIAGNOSIS — I2721 Secondary pulmonary arterial hypertension: Secondary | ICD-10-CM | POA: Diagnosis not present

## 2019-11-11 DIAGNOSIS — M25512 Pain in left shoulder: Secondary | ICD-10-CM | POA: Diagnosis not present

## 2019-11-11 DIAGNOSIS — E039 Hypothyroidism, unspecified: Secondary | ICD-10-CM | POA: Diagnosis not present

## 2019-11-11 DIAGNOSIS — E785 Hyperlipidemia, unspecified: Secondary | ICD-10-CM | POA: Diagnosis not present

## 2019-11-11 DIAGNOSIS — K760 Fatty (change of) liver, not elsewhere classified: Secondary | ICD-10-CM | POA: Diagnosis not present

## 2019-11-11 DIAGNOSIS — J0101 Acute recurrent maxillary sinusitis: Secondary | ICD-10-CM | POA: Diagnosis not present

## 2019-11-11 DIAGNOSIS — J9611 Chronic respiratory failure with hypoxia: Secondary | ICD-10-CM | POA: Diagnosis not present

## 2019-11-11 DIAGNOSIS — Z515 Encounter for palliative care: Secondary | ICD-10-CM

## 2019-11-11 DIAGNOSIS — I252 Old myocardial infarction: Secondary | ICD-10-CM | POA: Diagnosis not present

## 2019-11-11 DIAGNOSIS — I5032 Chronic diastolic (congestive) heart failure: Secondary | ICD-10-CM | POA: Diagnosis not present

## 2019-11-11 DIAGNOSIS — I110 Hypertensive heart disease with heart failure: Secondary | ICD-10-CM | POA: Diagnosis not present

## 2019-11-11 NOTE — Telephone Encounter (Signed)
Tammy Fernandez from [facility] is calling-1-Pt wants to know if she can be prescribed something other than sucralfate (CARAFATE) 1 g tablet [[id number]] this script  hurts her stomach.  2-The sodium that's thermo tab each tablet is 452mg  and pt gets 2 a day, so this is 100 mg short of what the doc wants. Thermo tab also contains potassium. 3-Pt wants to know if she can take anything for gas. 4-Son is concerned that her My Chart info has fluid restrictions in it, but no one knows what the fluid restrictions should be. 5-Pt would like to know if she can drink Boost and if so how many? 6-When pt uses trilogy vent, her son is turning up the oxygen  to 5 liters. [name] would like to know if this is ok or if it should stay at 2 liters like the order says. 7-[name] wants confirmation that the propranolol 60mg  was d/c and was  replaced with amlodopine 10 mg a day. Please advise Tammy Fernandez at [phone].

## 2019-11-11 NOTE — Telephone Encounter (Signed)
Son calling back to follow up on the phone call that needs to be done for the pt.  pt needs attention soon asap.  He states pt cannot sit down and her bottom hurts.  He states there is a stomach, acid reflux pill as well.  They one she has is not working.

## 2019-11-11 NOTE — Progress Notes (Addendum)
[facility] Consult Note Telephone: [phone]  Fax: [phone]  PATIENT NAME: Tammy Fernandez DOB: 1924-06-11 MRN: [id number]  PRIMARY CARE PROVIDER:   Rutherford Guys, MD  REFERRING PROVIDER:  Rutherford Guys, MD [address] [place],  [place] [address]   RESPONSIBLE PARTY:   RESPONSIBLE PARTY:Tammy Fernandez is HCPOA.Tammy Fernandez, son, lives at home with patient,[phone]    RECOMMENDATIONS/PLAN:   [facility] Care Planning/Goals of Care: Patient is a full code. Goals of care include to maximize quality of life and symptom management. Tammy Fernandez with ongoing fear of possible admission for patient and he wants everything done to prevent it. He said patient is usually admitted to the hospital during this time of the year. Therapeutic listening and emotional support provided. Symptom management: Patient seen last week by PCP for ongoing nocturnal cough likely related to GERD. For her GERD symptoms, also encouraged taking Omeprazole and Carafet as ordered; sitting up after meals and avoiding aggravating factors such as mints and fried foods. Records show patient started on Spiriva. Patient did not know about it and said to ask Tammy Fernandez who was not home during visit. Tammy Fernandez- caregiver-  also did not know about it. Patient endorsed improvement in her coughing; no coughing/SOB during visit. Tammy Fernandez on the phone affirmed new medication- Spiriva  added for breathing. Education provided that it will help with her breathing and and should be used twice a day regularly as ordered.  PT is ongoing for stengthening/ambulation. Tammy Fernandez said he is expecting [facility] nurse for stage 2 sacral wound care. Reposition to relieve pressure explained to patient.  He mentioned patient with low sodium; currently on Democycline and sodium tablets. Stressed the need to take the medications as ordered religiously; Tammy Fernandez said patient to return to clinic in a week for lab  work and possible changes to plan of care if indicated.  Follow up: Palliative care will continue to follow patient for goals of care clarification and symptom management.  I spent 30  minutes providing this consultation, from 3pm to 3.30pm. More than 50% of the time in this consultation was spent on coordinating communication. HISTORY OF PRESENT ILLNESS:Tammy L Godboltis a 26year oldfemalewith multiple medical problems including intermittent diarrhea, pulmonary hypertension,  Chronic diastolic CHF, OSA, SIADH. Palliative Care was asked to help address goals of care.    CODE STATUS: full  PPS: 50% HOSPICE ELIGIBILITY/DIAGNOSIS: TBD  PAST MEDICAL HISTORY:  Past Medical History:  Diagnosis Date  . Anxiety   . Chronic lower back pain   . Constipation    h/o fecal disimpaction on 2017  . CVA (cerebral vascular accident) ([place]) 2015   neg workup, MRI small acute lacunar infarcts. ASA only  . Depression   . Diastolic CHF, chronic (HCC)    grade I, most recent echo Jan 2019  . Fatty liver   . GERD (gastroesophageal reflux disease)   . Hyperlipidemia   . Hypertension   . Hyponatremia    multiple episodes with encephalopathy, renal thinks SIADH  . Hypothyroidism, unspecified   . Internal hemorrhoid   . NSTEMI (non-ST elevated myocardial infarction) ([place]) 08/28/2016  . Palpitations   . PFO with atrial septal aneurysm    TTE 2015  . Pneumonia 2016  . Severe pulmonary arterial systolic hypertension ([place]) 12/25/2017   per echo, on 1L oxgen via [place]    SOCIAL HX:  Social History   Tobacco Use  . Smoking status: Never Smoker  . Smokeless tobacco: Former Systems developer  Types: Snuff  . Tobacco comment: "used snuff when I was 19"  Substance Use Topics  . Alcohol use: No    ALLERGIES:  Allergies  Allergen Reactions  . Crestor [Rosuvastatin Calcium] Other (See Comments)    Muscle Aches  . Keflex [Cephalexin] Other (See Comments)    dizziness  . Peanut Butter Flavor Hives  . Sulfa  Antibiotics Hives  . Cephalexin Other (See Comments)    dizziness  . Sulfa Drugs Cross Reactors Rash     PERTINENT MEDICATIONS:  Outpatient Encounter Medications as of 11/11/2019  Medication Sig  . albuterol (PROVENTIL) (2.5 MG/3ML) 0.083% nebulizer solution Take 3 mLs (2.5 mg total) by nebulization every 4 (four) hours as needed for wheezing or shortness of breath.  . ALPRAZolam (XANAX) 1 MG tablet TAKE 1 TABLET(1 MG) BY MOUTH TWICE DAILY AS NEEDED FOR ANXIETY  . amLODipine (NORVASC) 10 MG tablet TAKE 1 TABLET(10 MG) BY MOUTH DAILY  . Aspirin 81 MG EC tablet Take 81 mg by mouth daily.  . cetirizine (ZYRTEC) 10 MG tablet Take 10 mg by mouth daily.  . clobetasol ointment (TEMOVATE) 0.05 % Apply to affected area every night for 4 weeks, then every other day for 4 weeks and then twice a week for 4 weeks or until resolution.  Marland Kitchen demeclocycline (DECLOMYCIN) 300 MG tablet TAKE 1/2 TABLET BY MOUTH TWICE DAILY  . diclofenac sodium (VOLTAREN) 1 % GEL Apply 2 g topically 4 (four) times daily.  Marland Kitchen docusate sodium (COLACE) 100 MG capsule Take 100 mg by mouth daily as needed for mild constipation.  . fluticasone (FLONASE) 50 MCG/ACT nasal spray Place 1 spray into both nostrils 2 (two) times daily.  Marland Kitchen glycerin adult 2 g suppository Place 1 suppository rectally as needed for constipation.  Marland Kitchen ipratropium (ATROVENT) 0.03 % nasal spray Place 2 sprays into both nostrils 2 (two) times daily.  Marland Kitchen levothyroxine (SYNTHROID) 50 MCG tablet TAKE 1 TABLET(50 MCG) BY MOUTH DAILY BEFORE BREAKFAST  . meclizine (ANTIVERT) 12.5 MG tablet Take 1 tablet (12.5 mg total) by mouth 3 (three) times daily as needed for dizziness.  . nystatin cream (MYCOSTATIN) Apply 1 application topically 2 (two) times daily.  Marland Kitchen olopatadine (PATANOL) 0.1 % ophthalmic solution Place 1 drop into both eyes daily as needed for allergies.  Marland Kitchen omeprazole (PRILOSEC) 20 MG capsule Take 1 capsule (20 mg total) by mouth 2 (two) times daily before a meal.  .  propranolol ER (INDERAL LA) 60 MG 24 hr capsule TAKE 1 CAPSULE(60 MG) BY MOUTH DAILY  . sertraline (ZOLOFT) 25 MG tablet TK 1 T PO D  . sucralfate (CARAFATE) 1 g tablet Take 1 tablet (1 g total) by mouth at bedtime.   No facility-administered encounter medications on file as of 11/11/2019.     Rosaura Carpenter, NP

## 2019-11-12 NOTE — Telephone Encounter (Signed)
Lexine Baton can you handle this since this is your patient. Thanks Dgaddy, CMA

## 2019-11-12 NOTE — Telephone Encounter (Signed)
4th call   Says that this matter is urgent will be faxing over documentation on 11/13/19

## 2019-11-13 ENCOUNTER — Ambulatory Visit: Payer: Medicare Other

## 2019-11-13 ENCOUNTER — Institutional Professional Consult (permissible substitution): Payer: Medicare Other | Admitting: Pulmonary Disease

## 2019-11-13 ENCOUNTER — Telehealth: Payer: Self-pay

## 2019-11-13 NOTE — Telephone Encounter (Signed)
Please Advise on this. Pk/CMA 

## 2019-11-13 NOTE — Telephone Encounter (Signed)
Patient's concern/request has been addressed, see other TE

## 2019-11-13 NOTE — Telephone Encounter (Signed)
Please advise 

## 2019-11-13 NOTE — Telephone Encounter (Signed)
The wound care  requested has been taken care of. Other concerns were...  *the carafate is hurting her stomach, pcp suggest mylanta 30cc at bedtime.  *the sodium replacement, pt is taking 452mg  bid, pcp suggest 452mg  tid  *pt wanted to know what she can take for gas pcp suggest gax x *the reinstating of the fluid restrictions, pcp says follow nephrology's suggestion *pt wants to know can she drink boost? Per pcp she can have 1 a day *The trelegy pt is using at night should be accompanied with 2l of o2. Son is using 5L. Per pcp, there should be no increase in o2 if o2 levels are normal.   Spoke with Clarise Cruz, Pt  Nurse with [facility] to relay the instruction giving from the doctor, verbalized understanding. I told her I will relay to the doctor son is using 61ml of o2 because he feel he is compensating for the use of the machine.   Spoke with son, he says he is not increasing the o2 to 5L. He is only using 21/2 to 3 and 3L is only used when his mother gets up to walk.

## 2019-11-13 NOTE — Telephone Encounter (Signed)
Patient's concern/request has been addressed already. See other TE

## 2019-11-16 NOTE — Telephone Encounter (Signed)
This mater has been handled

## 2019-11-17 ENCOUNTER — Other Ambulatory Visit: Payer: Self-pay

## 2019-11-17 ENCOUNTER — Ambulatory Visit: Payer: Medicare Other

## 2019-11-17 ENCOUNTER — Telehealth: Payer: Self-pay

## 2019-11-17 DIAGNOSIS — E871 Hypo-osmolality and hyponatremia: Secondary | ICD-10-CM

## 2019-11-17 NOTE — Telephone Encounter (Signed)
[facility] called on behalf of the pt asserting they had received a verbal order for a medication called Gas X from Dr. Pamella Pert but had not received a dosage amount or frequency. They called looking for clerification on those two counts and also asserted the pt. Has been experiencing "cottage cheese" like urine and they had been asked to request the Dr. call in a medication for it. Best number [phone], the nurse I spoke to's name is [name].

## 2019-11-18 ENCOUNTER — Telehealth: Payer: Self-pay | Admitting: Family Medicine

## 2019-11-18 DIAGNOSIS — M25512 Pain in left shoulder: Secondary | ICD-10-CM | POA: Diagnosis not present

## 2019-11-18 DIAGNOSIS — I2721 Secondary pulmonary arterial hypertension: Secondary | ICD-10-CM | POA: Diagnosis not present

## 2019-11-18 DIAGNOSIS — E785 Hyperlipidemia, unspecified: Secondary | ICD-10-CM | POA: Diagnosis not present

## 2019-11-18 DIAGNOSIS — K760 Fatty (change of) liver, not elsewhere classified: Secondary | ICD-10-CM | POA: Diagnosis not present

## 2019-11-18 DIAGNOSIS — I110 Hypertensive heart disease with heart failure: Secondary | ICD-10-CM | POA: Diagnosis not present

## 2019-11-18 DIAGNOSIS — J0101 Acute recurrent maxillary sinusitis: Secondary | ICD-10-CM | POA: Diagnosis not present

## 2019-11-18 DIAGNOSIS — J9611 Chronic respiratory failure with hypoxia: Secondary | ICD-10-CM | POA: Diagnosis not present

## 2019-11-18 DIAGNOSIS — M545 Low back pain: Secondary | ICD-10-CM | POA: Diagnosis not present

## 2019-11-18 DIAGNOSIS — I252 Old myocardial infarction: Secondary | ICD-10-CM | POA: Diagnosis not present

## 2019-11-18 DIAGNOSIS — L89312 Pressure ulcer of right buttock, stage 2: Secondary | ICD-10-CM | POA: Diagnosis not present

## 2019-11-18 DIAGNOSIS — E039 Hypothyroidism, unspecified: Secondary | ICD-10-CM | POA: Diagnosis not present

## 2019-11-18 DIAGNOSIS — I5032 Chronic diastolic (congestive) heart failure: Secondary | ICD-10-CM | POA: Diagnosis not present

## 2019-11-18 LAB — BASIC METABOLIC PANEL
BUN/Creatinine Ratio: 22 (ref 12–28)
BUN: 15 mg/dL (ref 10–36)
CO2: 31 mmol/L — ABNORMAL HIGH (ref 20–29)
Calcium: 10.2 mg/dL (ref 8.7–10.3)
Chloride: 84 mmol/L — ABNORMAL LOW (ref 96–106)
Creatinine, Ser: 0.67 mg/dL (ref 0.57–1.00)
GFR calc Af Amer: 88 mL/min/{1.73_m2} (ref >59)
GFR calc non Af Amer: 76 mL/min/{1.73_m2} (ref >59)
Glucose: 81 mg/dL (ref 65–99)
Potassium: 4.7 mmol/L (ref 3.5–5.2)
Sodium: 129 mmol/L — ABNORMAL LOW (ref 134–144)

## 2019-11-18 NOTE — Telephone Encounter (Signed)
Need verbal orders to [facility] . Needs approval for wound care recommendations from nurse.   Please advise   Prescribed for 2 liters of oxygen  But on 2.5 right now

## 2019-11-18 NOTE — Telephone Encounter (Signed)
Please advise 

## 2019-11-18 NOTE — Telephone Encounter (Signed)
I have called Tammy Fernandez back with Home Health @ [phone] and gave the verbal orders to continue home health via voicemail.   When I called Tammy Fernandez with the home health she told me that the pt has the O2 on 2.5-3 liters at times and also there was one time pt son has put O2 to 4liters.   Tammy Fernandez was asking if O2 of 2.5 was appropriate. Please advise.

## 2019-11-19 ENCOUNTER — Telehealth: Payer: Self-pay

## 2019-11-19 ENCOUNTER — Other Ambulatory Visit: Payer: Self-pay | Admitting: *Deleted

## 2019-11-19 DIAGNOSIS — K760 Fatty (change of) liver, not elsewhere classified: Secondary | ICD-10-CM | POA: Diagnosis not present

## 2019-11-19 DIAGNOSIS — I5032 Chronic diastolic (congestive) heart failure: Secondary | ICD-10-CM | POA: Diagnosis not present

## 2019-11-19 DIAGNOSIS — J0101 Acute recurrent maxillary sinusitis: Secondary | ICD-10-CM | POA: Diagnosis not present

## 2019-11-19 DIAGNOSIS — E785 Hyperlipidemia, unspecified: Secondary | ICD-10-CM | POA: Diagnosis not present

## 2019-11-19 DIAGNOSIS — I2721 Secondary pulmonary arterial hypertension: Secondary | ICD-10-CM | POA: Diagnosis not present

## 2019-11-19 DIAGNOSIS — E039 Hypothyroidism, unspecified: Secondary | ICD-10-CM | POA: Diagnosis not present

## 2019-11-19 DIAGNOSIS — M25512 Pain in left shoulder: Secondary | ICD-10-CM | POA: Diagnosis not present

## 2019-11-19 DIAGNOSIS — I252 Old myocardial infarction: Secondary | ICD-10-CM | POA: Diagnosis not present

## 2019-11-19 DIAGNOSIS — M545 Low back pain: Secondary | ICD-10-CM | POA: Diagnosis not present

## 2019-11-19 DIAGNOSIS — I110 Hypertensive heart disease with heart failure: Secondary | ICD-10-CM | POA: Diagnosis not present

## 2019-11-19 DIAGNOSIS — J9611 Chronic respiratory failure with hypoxia: Secondary | ICD-10-CM | POA: Diagnosis not present

## 2019-11-19 NOTE — Telephone Encounter (Signed)
Gas x is over the counter, 180mg  four times a day as needed She is to reach out to her obgyn as discussed at recent visit for her vaginal discharge concerns.  thanks

## 2019-11-19 NOTE — Patient Outreach (Signed)
[facility]) Care Management  [place]  11/19/2019   Tammy Fernandez 05-Mar-1924 [id number]  RN Health Coach telephone call to patient  Son [name] (caregiver).  [employer] compliance verified. Per [name] the patient is doing better that she was.  She has care aides that have been coming and assisting. Patient breathing is better. Patient had an anxious moment where she could not feel the oxygen sim=nce she wears it all the time, but she got ok. Patient does have a pulse oximeter to help show the saturation levels. Per son the patient is seeing the Dr every month. Patient is not having any swelling at this time. Per son okay for follow up outreach calls.   Encounter Medications:  Outpatient Encounter Medications as of 11/19/2019  Medication Sig  . albuterol (PROVENTIL) (2.5 MG/3ML) 0.083% nebulizer solution Take 3 mLs (2.5 mg total) by nebulization every 4 (four) hours as needed for wheezing or shortness of breath.  . ALPRAZolam (XANAX) 1 MG tablet TAKE 1 TABLET(1 MG) BY MOUTH TWICE DAILY AS NEEDED FOR ANXIETY  . amLODipine (NORVASC) 10 MG tablet TAKE 1 TABLET(10 MG) BY MOUTH DAILY  . Aspirin 81 MG EC tablet Take 81 mg by mouth daily.  . cetirizine (ZYRTEC) 10 MG tablet Take 10 mg by mouth daily.  . clobetasol ointment (TEMOVATE) 0.05 % Apply to affected area every night for 4 weeks, then every other day for 4 weeks and then twice a week for 4 weeks or until resolution.  Marland Kitchen demeclocycline (DECLOMYCIN) 300 MG tablet TAKE 1/2 TABLET BY MOUTH TWICE DAILY  . diclofenac sodium (VOLTAREN) 1 % GEL Apply 2 g topically 4 (four) times daily.  Marland Kitchen docusate sodium (COLACE) 100 MG capsule Take 100 mg by mouth daily as needed for mild constipation.  . fluticasone (FLONASE) 50 MCG/ACT nasal spray Place 1 spray into both nostrils 2 (two) times daily.  Marland Kitchen glycerin adult 2 g suppository Place 1 suppository rectally as needed for constipation.  Marland Kitchen ipratropium (ATROVENT) 0.03 % nasal spray Place 2  sprays into both nostrils 2 (two) times daily.  Marland Kitchen levothyroxine (SYNTHROID) 50 MCG tablet TAKE 1 TABLET(50 MCG) BY MOUTH DAILY BEFORE BREAKFAST  . meclizine (ANTIVERT) 12.5 MG tablet Take 1 tablet (12.5 mg total) by mouth 3 (three) times daily as needed for dizziness.  . nystatin cream (MYCOSTATIN) Apply 1 application topically 2 (two) times daily.  Marland Kitchen olopatadine (PATANOL) 0.1 % ophthalmic solution Place 1 drop into both eyes daily as needed for allergies.  Marland Kitchen omeprazole (PRILOSEC) 20 MG capsule Take 1 capsule (20 mg total) by mouth 2 (two) times daily before a meal.  . propranolol ER (INDERAL LA) 60 MG 24 hr capsule TAKE 1 CAPSULE(60 MG) BY MOUTH DAILY  . sertraline (ZOLOFT) 25 MG tablet TK 1 T PO D  . sucralfate (CARAFATE) 1 g tablet Take 1 tablet (1 g total) by mouth at bedtime.   No facility-administered encounter medications on file as of 11/19/2019.    Functional Status:  In your present state of health, do you have any difficulty performing the following activities: 09/21/2019  Hearing? N  Vision? N  Difficulty concentrating or making decisions? Y  Comment some short term memory loss  Walking or climbing stairs? Y  Comment uses a walker or wheelchair  Doing errands, shopping? Y  Comment needs Agricultural engineer and eating ? Y  Comment son is caregiver  In the past six months, have you accidently leaked urine? Y  Comment  Per son sometimes incont  Do you have problems with loss of bowel control? N  Managing your Medications? Y  Comment son prepares  Managing your Finances? Y  Comment son Database administrator or managing your Housekeeping? Y  Comment lives with son/ son caregiver  Some recent data might be hidden    Fall/Depression Screening: Fall Risk  11/19/2019 11/05/2019 10/22/2019  Falls in the past year? 0 0 0  Number falls in past yr: 0 0 0  Injury with Fall? 0 0 0  Risk for fall due to : Impaired balance/gait;Impaired mobility - -  Risk for fall due to:  Comment - - -  Follow up Falls evaluation completed;Falls prevention discussed - -   PHQ 2/9 Scores 11/05/2019 10/22/2019 09/21/2019 09/21/2019 08/07/2019 07/09/2019 06/02/2019  PHQ - 2 Score 0 0 0 0 0 0 0  PHQ- 9 Score - - - - - - -   THN CM Care Plan Problem One     Most Recent Value  Care Plan Problem One  Knowledge deficit in self management of CHF  Role Documenting the Problem One  Health Coach  Care Plan for Problem One  Active  [facility] Long Term Goal   Patient will not have any admissions within the next 90 days for CHF  [facility] Long Term Goal Start Date  11/19/19  Interventions for Problem One Long Term Goal  RN reiterates CHF exacerbation. RN resent clinical key education to keep patient updated. RN will follow up with further discussion  [facility] CM Short Term Goal #1   Patient and son will have a better understanding of Low sodium diet within the next 30 days  Interventions for Short Term Goal #1  RN reiterates low sodium diet. RN will follow up for further questions  [facility] CM Short Term Goal #2   Patient son will have a better understanding of  dysphadia diet within the next 30 days  Interventions for Short Term Goal #2  Patient and son received the information. RN will follow up for further discussion  THN CM Short Term Goal #3  Patient son will have a better understanding of CHF action plan and zones within the next 30 days  Interventions for Short Tern Goal #3  RN reiterates the zones and action plan each outreach. RN will follow up for further discussion      Assessment:  Son has receiving the information from Health Coach Patient now has care aides that are assisting Patient and son will continue to benefit from [employer] telephonic outreach for education and support for CHF self management. Plan:  RN discussed CHF exacerbation RN sent a 2021 Calendar book  RN reiterated CHF exacerbation RN sent Know before you go sheet RN will follow up within the month of March  Gean Maidens  BSN RN [facility] Care Management [phone]

## 2019-11-19 NOTE — Telephone Encounter (Signed)
A therapist named Tharon Aquas called from [facility] requesting verbal orders for occupational therapy for the pt. Particularly focusing on range of motion. Left phone number [phone] to call with questions or to leave a message with verbal orders

## 2019-11-20 NOTE — Telephone Encounter (Signed)
Please advise 

## 2019-11-23 NOTE — Telephone Encounter (Signed)
Please call back and provide verbal orders. thanks

## 2019-11-25 NOTE — Telephone Encounter (Signed)
Spoke to [place] from [employer] and verbals given.

## 2019-11-26 ENCOUNTER — Other Ambulatory Visit: Payer: Self-pay | Admitting: Family Medicine

## 2019-11-26 MED ORDER — FLUTICASONE PROPIONATE 50 MCG/ACT NA SUSP: 1.0000 | Freq: Two times a day (BID) | NASAL | 5 refills | Status: DC

## 2019-11-26 MED ORDER — IPRATROPIUM BROMIDE 0.03 % NA SOLN: 2.0000 | Freq: Two times a day (BID) | NASAL | 0 refills | Status: DC

## 2019-11-26 MED ORDER — OLOPATADINE HCL 0.1 % OP SOLN: 1.0000 [drp] | Freq: Every day | OPHTHALMIC | 5 refills | Status: DC | PRN

## 2019-11-26 NOTE — Telephone Encounter (Signed)
Pt's son, [name] called to verify which eye drops and nasal spray needed refills. Pt's son states that the patient needs a refill on Olopatadine(Patanol) 0.1% ophthalmic solution and Flonase. Pt's son also requesting a 89 day supply for all medications.  Prescription for Xanax shows that refill should be available at the pharmacy.Called pharmacy and spoke with Moshe Salisbury who states that the patient would need a new prescription for Xanax to be sent in because the one on 09/07/19 was "closed" for some reason. Last refill was picked up Nov.  30th. Earliest day medication would be able to have a refill would be on 12/28.

## 2019-11-26 NOTE — Telephone Encounter (Signed)
Requested Prescriptions  Pending Prescriptions Disp Refills  . fluticasone (FLONASE) 50 MCG/ACT nasal spray 16 g 5    Sig: Place 1 spray into both nostrils 2 (two) times daily.     Ear, Nose, and Throat: Nasal Preparations - Corticosteroids Passed - 11/26/2019 11:11 AM      Passed - Valid encounter within last 12 months    Recent Outpatient Visits          3 weeks ago Nocturnal cough with wheeze   [facility], Lilia Argue, MD   1 month ago Diarrhea, unspecified type   [facility], Lilia Argue, MD   2 months ago Acute non-recurrent maxillary sinusitis   [facility], Lilia Argue, MD   3 months ago Vaginitis and vulvovaginitis   [facility], Lilia Argue, MD   4 months ago Hyponatremia   [facility], Lilia Argue, MD      Future Appointments            In 6 days Lorretta Harp, MD [place], [place]   In 2 weeks Rutherford Guys, MD Primary Care at [place], [facility]           . olopatadine (PATANOL) 0.1 % ophthalmic solution 5 mL 5    Sig: Place 1 drop into both eyes daily as needed for allergies.     Ophthalmology:  Antiallergy Passed - 11/26/2019 11:11 AM      Passed - Valid encounter within last 12 months    Recent Outpatient Visits          3 weeks ago Nocturnal cough with wheeze   [facility], Lilia Argue, MD   1 month ago Diarrhea, unspecified type   [facility], Lilia Argue, MD   2 months ago Acute non-recurrent maxillary sinusitis   [facility], Lilia Argue, MD   3 months ago Vaginitis and vulvovaginitis   [facility], Lilia Argue, MD   4 months ago Hyponatremia   [facility], Lilia Argue, MD      Future Appointments            In 6 days Lorretta Harp, MD [place], [facility]   In 2 weeks Rutherford Guys, MD Primary Care at [place], [facility]           . ALPRAZolam (XANAX) 1 MG tablet  60 tablet 2    Sig: TAKE 1 TABLET(1 MG) BY MOUTH TWICE DAILY AS NEEDED FOR ANXIETY     Not Delegated - Psychiatry:  Anxiolytics/Hypnotics Failed - 11/26/2019 11:11 AM      Failed - This refill cannot be delegated      Failed - Urine Drug Screen completed in last 360 days.      Passed - Valid encounter within last 6 months    Recent Outpatient Visits          3 weeks ago Nocturnal cough with wheeze   [facility], Lilia Argue, MD   1 month ago Diarrhea, unspecified type   [facility], Lilia Argue, MD   2 months ago Acute non-recurrent maxillary sinusitis   [facility], Lilia Argue, MD   3 months ago Vaginitis and vulvovaginitis   [facility], Lilia Argue, MD   4 months ago Hyponatremia  [facility], Meda Coffee, MD      Future Appointments            In 6 days Runell Gess, MD [facility] [place], [facility]   In 2 weeks Myles Lipps, MD Primary Care at [place], [facility]

## 2019-11-26 NOTE — Telephone Encounter (Signed)
Medication Refill - Medication: ALPRAZolam (XANAX) 1 MG tablet  Pt's son called to request an urgent refill of this and her "nasal spray/eye drops"    Has the patient contacted their pharmacy? Yes.   (Agent: If no, request that the patient contact the pharmacy for the refill.) (Agent: If yes, when and what did the pharmacy advise?)  Preferred Pharmacy (with phone number or street name):  [facility] [place] - [address] AT [address] [place]  [place] [address]  Phone: [phone] Fax: [phone]     Agent: Please be advised that RX refills may take up to 3 business days. We ask that you follow-up with your pharmacy.

## 2019-11-26 NOTE — Telephone Encounter (Signed)
Requested medication (s) are due for refill today: yes  Requested medication (s) are on the active medication list: yes  Last refill:  09/07/2019  Future visit scheduled: yes  Notes to clinic:  not delegated    Requested Prescriptions  Pending Prescriptions Disp Refills   ALPRAZolam (XANAX) 1 MG tablet 60 tablet 2    Sig: TAKE 1 TABLET(1 MG) BY MOUTH TWICE DAILY AS NEEDED FOR ANXIETY      Not Delegated - Psychiatry:  Anxiolytics/Hypnotics Failed - 11/26/2019 11:11 AM      Failed - This refill cannot be delegated      Failed - Urine Drug Screen completed in last 360 days.      Passed - Valid encounter within last 6 months    Recent Outpatient Visits           3 weeks ago Nocturnal cough with wheeze   [facility], Meda Coffee, MD   1 month ago Diarrhea, unspecified type   [facility], Meda Coffee, MD   2 months ago Acute non-recurrent maxillary sinusitis   [facility], Meda Coffee, MD   3 months ago Vaginitis and vulvovaginitis   [facility], Meda Coffee, MD   4 months ago Hyponatremia   [facility], Meda Coffee, MD       Future Appointments             In 6 days Runell Gess, MD [facility] [place], [facility]   In 2 weeks Myles Lipps, MD Primary Care at [place], [facility]             Signed Prescriptions Disp Refills   fluticasone (FLONASE) 50 MCG/ACT nasal spray 16 g 5    Sig: Place 1 spray into both nostrils 2 (two) times daily.      Ear, Nose, and Throat: Nasal Preparations - Corticosteroids Passed - 11/26/2019 11:11 AM      Passed - Valid encounter within last 12 months    Recent Outpatient Visits           3 weeks ago Nocturnal cough with wheeze   [facility], Meda Coffee, MD   1 month ago Diarrhea, unspecified type   [facility], Meda Coffee, MD   2 months ago Acute non-recurrent maxillary sinusitis   [facility], Meda Coffee, MD   3 months ago Vaginitis and vulvovaginitis   [facility], Meda Coffee, MD   4 months ago Hyponatremia   [facility], Meda Coffee, MD       Future Appointments             In 6 days Runell Gess, MD [facility] [place], [facility]   In 2 weeks Myles Lipps, MD Primary Care at [place], [facility]              olopatadine (PATANOL) 0.1 % ophthalmic solution 5 mL 5    Sig: Place 1 drop into both eyes daily as needed for allergies.      Ophthalmology:  Antiallergy Passed - 11/26/2019 11:11 AM      Passed - Valid encounter within last 12 months    Recent Outpatient Visits           3 weeks ago Nocturnal cough with wheeze   [facility], Meda Coffee, MD   1 month  ago Diarrhea, unspecified type   [facility], Lilia Argue, MD   2 months ago Acute non-recurrent maxillary sinusitis   [facility], Lilia Argue, MD   3 months ago Vaginitis and vulvovaginitis   [facility], Lilia Argue, MD   4 months ago Hyponatremia   [facility], Lilia Argue, MD       Future Appointments             In 6 days Gwenlyn Found Pearletha Forge, MD [place], [facility]   In 2 weeks Rutherford Guys, MD Primary Care at [place], [facility]

## 2019-11-30 ENCOUNTER — Telehealth: Payer: Self-pay | Admitting: Family Medicine

## 2019-11-30 MED ORDER — ALPRAZOLAM 1 MG PO TABS: ORAL_TABLET | ORAL | 2 refills | Status: DC

## 2019-11-30 NOTE — Telephone Encounter (Signed)
Pt son called and asked if the nurses from home health could get authorization for wound care says that he spoke to them over the Christmas holiday    Please advise

## 2019-11-30 NOTE — Telephone Encounter (Signed)
I called home health [phone] to see if this is the correct place that was requesting a home health wound care order. If so then verbal was given from Dr Pamella Pert if not them for there office to return my call so I can get this order to the corrected place.

## 2019-12-02 ENCOUNTER — Ambulatory Visit: Payer: Medicare Other | Admitting: Cardiovascular Disease

## 2019-12-02 DIAGNOSIS — J9601 Acute respiratory failure with hypoxia: Secondary | ICD-10-CM | POA: Diagnosis not present

## 2019-12-02 DIAGNOSIS — R7881 Bacteremia: Secondary | ICD-10-CM | POA: Diagnosis not present

## 2019-12-02 DIAGNOSIS — I6789 Other cerebrovascular disease: Secondary | ICD-10-CM | POA: Diagnosis not present

## 2019-12-02 DIAGNOSIS — B9562 Methicillin resistant Staphylococcus aureus infection as the cause of diseases classified elsewhere: Secondary | ICD-10-CM | POA: Diagnosis not present

## 2019-12-07 ENCOUNTER — Other Ambulatory Visit: Payer: Self-pay

## 2019-12-07 ENCOUNTER — Other Ambulatory Visit: Payer: Medicare Other | Admitting: Hospice

## 2019-12-07 DIAGNOSIS — Z515 Encounter for palliative care: Secondary | ICD-10-CM

## 2019-12-07 NOTE — Progress Notes (Addendum)
[facility] Palliative Care Consult Note Telephone: [phone]  Fax: [phone]  PATIENT NAME: Tammy Fernandez DOB: 1924/07/16 MRN: [id number]  PRIMARY CARE PROVIDER:   Rutherford Guys, MD  REFERRING PROVIDER:  Rutherford Guys, MD [address] [place],  [place] [address]  RESPONSIBLE PARTY:RESPONSIBLE PARTY:Tammy Fernandez is HCPOA.Tammy Fernandez, son, lives at home with patient,[phone]   TELEHEALTH VISIT STATEMENT Due to the COVID-19 crisis, this visit was done via telephone from my office. It was initiated and consented to by this patient and/or family.  RECOMMENDATIONS/PLAN:   Advance Care Planning/Goals of Care: Telehealth visit consisted of building trust and checking on how patient is doing. Tammy Fernandez expressed joy that patient did not go to the hospital at end of year '20. That was his greatest worry because in the past, patient gets admitted to the hospital end of the year. Therapeutic listening and validation for quality care of home given to patient at home provided. Patient remains a full code with Goals of care including to maximize quality of life and symptom management. Symptom management: Ongoing GERD symptoms currently on Omeprazole. He said patient sometimes takes Omeprazole and Mylanta at bedtime. Education provided on taking her Omeprazole regularly as ordered even when the symptoms appear to improve; to take it twice a day regularly, preferably before meals.  Follow up: Palliative care will continue to follow patient for goals of care clarification and symptom management. In-person visit scheduled for 12/21/2019. I spent 30 minutes providing this consultation, from 10.45am to 11.15am. More than 50% of the time in this consultation was spent on coordinating communication HISTORY OF PRESENT ILLNESS:Tammy L Godboltis a 34year oldfemalewith multiple medical problems includingintermittent diarrhea,pulmonary hypertension,Chronic  diastolic CHF,OSA, SIADH. Palliative Care was asked to help address goals of care.  CODE STATUS: Full   PPS: 50% HOSPICE ELIGIBILITY/DIAGNOSIS: TBD  PAST MEDICAL HISTORY:  Past Medical History:  Diagnosis Date  . Anxiety   . Chronic lower back pain   . Constipation    h/o fecal disimpaction on 2017  . CVA (cerebral vascular accident) ([place]) 2015   neg workup, MRI small acute lacunar infarcts. ASA only  . Depression   . Diastolic CHF, chronic (HCC)    grade I, most recent echo Jan 2019  . Fatty liver   . GERD (gastroesophageal reflux disease)   . Hyperlipidemia   . Hypertension   . Hyponatremia    multiple episodes with encephalopathy, renal thinks SIADH  . Hypothyroidism, unspecified   . Internal hemorrhoid   . NSTEMI (non-ST elevated myocardial infarction) ([place]) 08/28/2016  . Palpitations   . PFO with atrial septal aneurysm    TTE 2015  . Pneumonia 2016  . Severe pulmonary arterial systolic hypertension ([place]) 12/25/2017   per echo, on 1L oxgen via [place]    SOCIAL HX:  Social History   Tobacco Use  . Smoking status: Never Smoker  . Smokeless tobacco: Former Systems developer    Types: Snuff  . Tobacco comment: "used snuff when I was 19"  Substance Use Topics  . Alcohol use: No    ALLERGIES:  Allergies  Allergen Reactions  . Crestor [Rosuvastatin Calcium] Other (See Comments)    Muscle Aches  . Keflex [Cephalexin] Other (See Comments)    dizziness  . Peanut Butter Flavor Hives  . Sulfa Antibiotics Hives  . Cephalexin Other (See Comments)    dizziness  . Sulfa Drugs Cross Reactors Rash     PERTINENT MEDICATIONS:  Outpatient Encounter Medications as of 12/07/2019  Medication Sig  . albuterol (PROVENTIL) (2.5 MG/3ML) 0.083% nebulizer solution Take 3 mLs (2.5 mg total) by nebulization every 4 (four) hours as needed for wheezing or shortness of breath.  . ALPRAZolam (XANAX) 1 MG tablet TAKE 1 TABLET(1 MG) BY MOUTH TWICE DAILY AS NEEDED FOR ANXIETY  . amLODipine (NORVASC) 10  MG tablet TAKE 1 TABLET(10 MG) BY MOUTH DAILY  . Aspirin 81 MG EC tablet Take 81 mg by mouth daily.  . cetirizine (ZYRTEC) 10 MG tablet Take 10 mg by mouth daily.  . clobetasol ointment (TEMOVATE) 0.05 % Apply to affected area every night for 4 weeks, then every other day for 4 weeks and then twice a week for 4 weeks or until resolution.  Marland Kitchen demeclocycline (DECLOMYCIN) 300 MG tablet TAKE 1/2 TABLET BY MOUTH TWICE DAILY  . diclofenac sodium (VOLTAREN) 1 % GEL Apply 2 g topically 4 (four) times daily.  Marland Kitchen docusate sodium (COLACE) 100 MG capsule Take 100 mg by mouth daily as needed for mild constipation.  . fluticasone (FLONASE) 50 MCG/ACT nasal spray Place 1 spray into both nostrils 2 (two) times daily.  Marland Kitchen glycerin adult 2 g suppository Place 1 suppository rectally as needed for constipation.  Marland Kitchen ipratropium (ATROVENT) 0.03 % nasal spray Place 2 sprays into both nostrils 2 (two) times daily.  Marland Kitchen levothyroxine (SYNTHROID) 50 MCG tablet TAKE 1 TABLET(50 MCG) BY MOUTH DAILY BEFORE BREAKFAST  . meclizine (ANTIVERT) 12.5 MG tablet Take 1 tablet (12.5 mg total) by mouth 3 (three) times daily as needed for dizziness.  . nystatin cream (MYCOSTATIN) Apply 1 application topically 2 (two) times daily.  Marland Kitchen olopatadine (PATANOL) 0.1 % ophthalmic solution Place 1 drop into both eyes daily as needed for allergies.  Marland Kitchen omeprazole (PRILOSEC) 20 MG capsule Take 1 capsule (20 mg total) by mouth 2 (two) times daily before a meal.  . propranolol ER (INDERAL LA) 60 MG 24 hr capsule TAKE 1 CAPSULE(60 MG) BY MOUTH DAILY  . sertraline (ZOLOFT) 25 MG tablet TK 1 T PO D  . sucralfate (CARAFATE) 1 g tablet Take 1 tablet (1 g total) by mouth at bedtime.   No facility-administered encounter medications on file as of 12/07/2019.    Rosaura Carpenter, NP

## 2019-12-08 ENCOUNTER — Telehealth: Payer: Self-pay | Admitting: Family Medicine

## 2019-12-08 NOTE — Telephone Encounter (Signed)
Pt son called requesting a call back today in regards to homehealth orders .  Please advise asap

## 2019-12-08 NOTE — Telephone Encounter (Signed)
Please Advise

## 2019-12-10 ENCOUNTER — Ambulatory Visit (INDEPENDENT_AMBULATORY_CARE_PROVIDER_SITE_OTHER): Payer: Medicare Other | Admitting: Family Medicine

## 2019-12-10 ENCOUNTER — Telehealth: Payer: Self-pay

## 2019-12-10 ENCOUNTER — Other Ambulatory Visit: Payer: Self-pay

## 2019-12-10 VITALS — BP 134/60 | HR 73 | Temp 97.9°F

## 2019-12-10 DIAGNOSIS — L89151 Pressure ulcer of sacral region, stage 1: Secondary | ICD-10-CM | POA: Diagnosis not present

## 2019-12-10 DIAGNOSIS — E039 Hypothyroidism, unspecified: Secondary | ICD-10-CM

## 2019-12-10 DIAGNOSIS — I10 Essential (primary) hypertension: Secondary | ICD-10-CM

## 2019-12-10 DIAGNOSIS — K219 Gastro-esophageal reflux disease without esophagitis: Secondary | ICD-10-CM | POA: Diagnosis not present

## 2019-12-10 DIAGNOSIS — E871 Hypo-osmolality and hyponatremia: Secondary | ICD-10-CM | POA: Diagnosis not present

## 2019-12-10 NOTE — Patient Instructions (Signed)
° ° ° °  If you have lab work done today you will be contacted with your lab results within the next 2 weeks.  If you have not heard from us then please contact us. The fastest way to get your results is to register for My Chart. ° ° °IF you received an x-ray today, you will receive an invoice from [facility]. Please contact [facility] at [phone] with questions or concerns regarding your invoice.  ° °IF you received labwork today, you will receive an invoice from [facility]. Please contact [facility] at [phone] with questions or concerns regarding your invoice.  ° °Our billing staff will not be able to assist you with questions regarding bills from these companies. ° °You will be contacted with the lab results as soon as they are available. The fastest way to get your results is to activate your My Chart account. Instructions are located on the last page of this paperwork. If you have not heard from us regarding the results in 2 weeks, please contact this office. °  ° ° ° °

## 2019-12-10 NOTE — Telephone Encounter (Signed)
Spoke with Huntley Dec at [facility]. Says they are waiting for supplies to be sent to pt home in order for nurse to come in for wound care. Pt wound began as skin tear has progressed stage 1 pressure ulcer.

## 2019-12-10 NOTE — Progress Notes (Signed)
1/7/20213:54 PM  Tammy Fernandez [birth year], 86 y.o., female [id number]  Chief Complaint  Patient presents with  . Pain    pain in the buttocks area, says home health has not been out to tend to the wound    HPI:   Patient is a 86 y.o. female with past medical history significant for hyponatremia2/2 SIADH, constipation, HTN,dCHF,severepHTN, chronic respiratory failureon oxygen, CVA, GERD and seasonal allergies who presents today for pain in buttock  Per HH nurse - stage 1 pressure ulcer Waiting for supplies  GERD has been uncontrolled since had to stop pepcid due to diarrhea Taking omeprazole 20mg  and 40mg  at bedtime sulcrafate caused abd pain Doing mylanta and simethicone Follows diet Sleeps upright Has not been taking her probiotics Barium swallow - mild aspiration risk, esophageal motility  She also has a buttock pressure ulcer HH aware, verbal orders given  Lab Results  Component Value Date   CREATININE 0.64 12/10/2019   BUN 13 12/10/2019   NA 131 (L) 12/10/2019   K 5.0 12/10/2019   CL 87 (L) 12/10/2019   CO2 32 (H) 12/10/2019   Lab Results  Component Value Date   TSH 1.070 11/05/2019    Depression screen PHQ 2/9 12/10/2019 11/05/2019 10/22/2019  Decreased Interest 0 0 0  Down, Depressed, Hopeless 0 0 0  PHQ - 2 Score 0 0 0  Altered sleeping - - -  Tired, decreased energy - - -  Change in appetite - - -  Feeling bad or failure about yourself  - - -  Trouble concentrating - - -  Moving slowly or fidgety/restless - - -  Suicidal thoughts - - -  PHQ-9 Score - - -  Difficult doing work/chores - - -  Some recent data might be hidden    Fall Risk  12/10/2019 11/19/2019 11/05/2019 10/22/2019 09/21/2019  Falls in the past year? 0 0 0 0 0  Number falls in past yr: 0 0 0 0 0  Injury with Fall? 0 0 0 0 0  Risk for fall due to : - Impaired balance/gait;Impaired mobility - - -  Risk for fall due to: Comment - - - - -  Follow up - Falls evaluation  completed;Falls prevention discussed - - -     Allergies  Allergen Reactions  . Crestor [Rosuvastatin Calcium] Other (See Comments)    Muscle Aches  . Keflex [Cephalexin] Other (See Comments)    dizziness  . Peanut Butter Flavor Hives  . Sulfa Antibiotics Hives  . Cephalexin Other (See Comments)    dizziness  . Sulfa Drugs Cross Reactors Rash    Prior to Admission medications   Medication Sig Start Date End Date Taking? Authorizing Provider  albuterol (PROVENTIL) (2.5 MG/3ML) 0.083% nebulizer solution Take 3 mLs (2.5 mg total) by nebulization every 4 (four) hours as needed for wheezing or shortness of breath. 10/13/19  Yes 09/23/2019, MD  ALPRAZolam 13/10/20) 1 MG tablet TAKE 1 TABLET(1 MG) BY MOUTH TWICE DAILY AS NEEDED FOR ANXIETY 11/30/19  Yes Prudy Feeler, MD  amLODipine (NORVASC) 10 MG tablet TAKE 1 TABLET(10 MG) BY MOUTH DAILY 08/17/19  Yes Myles Lipps, MD  Aspirin 81 MG EC tablet Take 81 mg by mouth daily.   Yes [provider]  cetirizine (ZYRTEC) 10 MG tablet Take 10 mg by mouth daily.   Yes [provider]  clobetasol ointment (TEMOVATE) 0.05 % Apply to affected area every night for 4 weeks, then every other day  for 4 weeks and then twice a week for 4 weeks or until resolution. 09/16/19  Yes Constant, Peggy, MD  demeclocycline (DECLOMYCIN) 300 MG tablet TAKE 1/2 TABLET BY MOUTH TWICE DAILY 10/13/19  Yes Myles Lipps, MD  diclofenac sodium (VOLTAREN) 1 % GEL Apply 2 g topically 4 (four) times daily. 06/02/19  Yes Stallings, Zoe A, MD  docusate sodium (COLACE) 100 MG capsule Take 100 mg by mouth daily as needed for mild constipation.   Yes [provider]  fluticasone (FLONASE) 50 MCG/ACT nasal spray Place 1 spray into both nostrils 2 (two) times daily. 11/26/19  Yes Myles Lipps, MD  glycerin adult 2 g suppository Place 1 suppository rectally as needed for constipation. 01/27/19  Yes Myles Lipps, MD  ipratropium (ATROVENT) 0.03 %  nasal spray Place 2 sprays into both nostrils 2 (two) times daily. 11/26/19  Yes Myles Lipps, MD  levothyroxine (SYNTHROID) 50 MCG tablet TAKE 1 TABLET(50 MCG) BY MOUTH DAILY BEFORE BREAKFAST 11/07/19  Yes Myles Lipps, MD  meclizine (ANTIVERT) 12.5 MG tablet Take 1 tablet (12.5 mg total) by mouth 3 (three) times daily as needed for dizziness. 08/07/19  Yes Myles Lipps, MD  nystatin cream (MYCOSTATIN) Apply 1 application topically 2 (two) times daily. 03/03/19  Yes Myles Lipps, MD  olopatadine (PATANOL) 0.1 % ophthalmic solution Place 1 drop into both eyes daily as needed for allergies. 11/26/19  Yes Myles Lipps, MD  omeprazole (PRILOSEC) 20 MG capsule Take 1 capsule (20 mg total) by mouth 2 (two) times daily before a meal. 04/03/19  Yes Myles Lipps, MD  propranolol ER (INDERAL LA) 60 MG 24 hr capsule TAKE 1 CAPSULE(60 MG) BY MOUTH DAILY 08/17/19  Yes Myles Lipps, MD  sertraline (ZOLOFT) 25 MG tablet TK 1 T PO D 07/10/19  Yes [provider]  sucralfate (CARAFATE) 1 g tablet Take 1 tablet (1 g total) by mouth at bedtime. 11/05/19  Yes Myles Lipps, MD    Past Medical History:  Diagnosis Date  . Anxiety   . Chronic lower back pain   . Constipation    h/o fecal disimpaction on 2017  . CVA (cerebral vascular accident) (HCC) 2015   neg workup, MRI small acute lacunar infarcts. ASA only  . Depression   . Diastolic CHF, chronic (HCC)    grade I, most recent echo Jan 2019  . Fatty liver   . GERD (gastroesophageal reflux disease)   . Hyperlipidemia   . Hypertension   . Hyponatremia    multiple episodes with encephalopathy, renal thinks SIADH  . Hypothyroidism, unspecified   . Internal hemorrhoid   . NSTEMI (non-ST elevated myocardial infarction) (HCC) 08/28/2016  . Palpitations   . PFO with atrial septal aneurysm    TTE 2015  . Pneumonia 2016  . Severe pulmonary arterial systolic hypertension (HCC) 12/25/2017   per echo, on 1L oxgen via [place]    Past  Surgical History:  Procedure Laterality Date  . ABDOMINAL HYSTERECTOMY    . CATARACT EXTRACTION W/ INTRAOCULAR LENS  IMPLANT, BILATERAL Bilateral   . CESAREAN SECTION    . TEE WITHOUT CARDIOVERSION N/A 01/19/2014   Procedure: TRANSESOPHAGEAL ECHOCARDIOGRAM (TEE);  Surgeon: Laurey Morale, MD;  Location[facility] ENDOSCOPY;  Service: Cardiovascular;  Laterality: N/A;  [name]  . VAGINAL HYSTERECTOMY      Social History   Tobacco Use  . Smoking status: Never Smoker  . Smokeless tobacco: Former Neurosurgeon  Types: Snuff  . Tobacco comment: "used snuff when I was 19"  Substance Use Topics  . Alcohol use: No    Family History  Problem Relation Age of Onset  . Stroke Father   . Hypertension Father   . Colon cancer Cousin   . Colon cancer Other        Aunt  . Diabetes Other        aunt and cousins  . Heart disease Other        cousins  . Healthy Daughter   . Healthy Son     Review of Systems  Constitutional: Negative for chills and fever.  Respiratory: Negative for cough and shortness of breath.   Cardiovascular: Negative for chest pain, palpitations and leg swelling.  Gastrointestinal: Negative for abdominal pain, nausea and vomiting.   Per hpi  OBJECTIVE:  Today's Vitals   12/10/19 1603  BP: 134/60  Pulse: 73  Temp: 97.9 F (36.6 C)  SpO2: 93%   There is no height or weight on file to calculate BMI.   Physical Exam Vitals and nursing note reviewed. Exam conducted with a chaperone present.  Constitutional:      Appearance: She is well-developed.  HENT:     Head: Normocephalic and atraumatic.     Mouth/Throat:     Pharynx: No oropharyngeal exudate.  Eyes:     General: No scleral icterus.    Conjunctiva/sclera: Conjunctivae normal.     Pupils: Pupils are equal, round, and reactive to light.  Cardiovascular:     Rate and Rhythm: Normal rate and regular rhythm.     Heart sounds: Normal heart sounds. No murmur. No friction rub. No gallop.   Pulmonary:     Effort:  Pulmonary effort is normal.     Breath sounds: Normal breath sounds. No wheezing, rhonchi or rales.     Comments: Using her oxygen Genitourinary:    Comments: Right upper gluteus with 2 cm area of erythema, no skin breakdown or drainage Musculoskeletal:     Cervical back: Neck supple.  Skin:    General: Skin is warm and dry.  Neurological:     Mental Status: She is alert and oriented to person, place, and time.     No results found for this or any previous visit (from the past 24 hour(s)).  No results found.   ASSESSMENT and PLAN  1. Gastroesophageal reflux disease without esophagitis Discussed diet precautions. Limited with medication options. Restart probiotics.   2. Pressure injury of sacral region, stage 1 Discussed use of zinc oxide. HH aware.  3. Essential hypertension Controlled. Continue current regime.   4. Hypothyroidism, unspecified type Controlled. Continue current regime.   5. Hyponatremia 2/2 SIADH, discussed importance of fluid restrictions, per renal cont with salt tabs and declomycin - Basic Metabolic Panel  Return in about 4 weeks (around 01/07/2020).    Rutherford Guys, MD Primary [facility] at [place], [place] [address] Ph.  [phone] Fax [phone]

## 2019-12-11 ENCOUNTER — Encounter: Payer: Self-pay | Admitting: Family Medicine

## 2019-12-11 LAB — BASIC METABOLIC PANEL
BUN/Creatinine Ratio: 20 (ref 12–28)
BUN: 13 mg/dL (ref 10–36)
CO2: 32 mmol/L — ABNORMAL HIGH (ref 20–29)
Calcium: 9.7 mg/dL (ref 8.7–10.3)
Chloride: 87 mmol/L — ABNORMAL LOW (ref 96–106)
Creatinine, Ser: 0.64 mg/dL (ref 0.57–1.00)
GFR calc Af Amer: 89 mL/min/{1.73_m2} (ref >59)
GFR calc non Af Amer: 77 mL/min/{1.73_m2} (ref >59)
Glucose: 78 mg/dL (ref 65–99)
Potassium: 5 mmol/L (ref 3.5–5.2)
Sodium: 131 mmol/L — ABNORMAL LOW (ref 134–144)

## 2019-12-15 ENCOUNTER — Telehealth: Payer: Self-pay | Admitting: Hospice

## 2019-12-15 ENCOUNTER — Other Ambulatory Visit: Payer: Self-pay | Admitting: Family Medicine

## 2019-12-15 NOTE — Telephone Encounter (Signed)
Spoke with patient's son Tammy Fernandez about rescheduling the Palliative f/u visit scheduled for 12/21/19, due to office being closed, this was changed to 12/23/19 @ 3 PM.

## 2019-12-23 ENCOUNTER — Other Ambulatory Visit: Payer: Self-pay

## 2019-12-23 ENCOUNTER — Other Ambulatory Visit: Payer: Self-pay | Admitting: Obstetrics and Gynecology

## 2019-12-23 ENCOUNTER — Telehealth: Payer: Self-pay | Admitting: Obstetrics and Gynecology

## 2019-12-23 ENCOUNTER — Other Ambulatory Visit: Payer: Medicare Other | Admitting: Hospice

## 2019-12-23 DIAGNOSIS — Z515 Encounter for palliative care: Secondary | ICD-10-CM | POA: Diagnosis not present

## 2019-12-23 DIAGNOSIS — J9611 Chronic respiratory failure with hypoxia: Secondary | ICD-10-CM

## 2019-12-23 MED ORDER — FLUCONAZOLE 150 MG PO TABS: 150.0000 mg | ORAL_TABLET | Freq: Once | ORAL | 0 refills | Status: AC

## 2019-12-23 NOTE — Telephone Encounter (Signed)
Called pt and her son [name] answered the phone. He reports his mother is at home. He is not sure what medication is needed. He will be home in about 15 minutes and would like a call back so he and the patients nurse can figure out what medication has been requested.

## 2019-12-23 NOTE — Telephone Encounter (Signed)
Ms. Doebler son called and has requested his mother get another Rx of the last medication she was given. He said it worked for her, and he really doesn't want her coming out.

## 2019-12-23 NOTE — Progress Notes (Signed)
Therapist, nutritional Palliative Care Consult Note Telephone: [phone]  Fax: [phone]  PATIENT NAME: Tammy Fernandez DOB: 29-Jan-1924 MRN: [id number]  PRIMARY CARE PROVIDER:   Myles Lipps, MD  REFERRING PROVIDER:  Myles Lipps, MD [address] [place],  [place] [address] RESPONSIBLE PARTY:Tammy Fernandez is HCPOA.Mare Ferrari, son, lives at home with [phone]  Advance Care Planning/Goals of Care: visit consisted of building trust and follow up on palliative care. Patient remains a full code with goals of care including to maximize quality of life and symptom management. Patient in high spirit today. She shared her joy that God spared her life to prophesy for people around her and she is thankful to be alive to witness 12/23/2019. Therapeutic listening, validation and emotional support provided.  Symptom management: She denied pain/discomfort and no GERD symptoms.  No respiratory distress.  Patient needs another Nebulizer ; the one at home is nonfunctional. Reached out to PCP with need and also need for nurse to follow up patient at home for stage 2 sacral wound. PCP endorsed she would take care of needs. Patient continues with her mini cycling exercise and ambulation with her rolling walker within her home. Encouraged ongoing supportive care at home.  Follow up: Palliative care will continue to follow patient for goals of care clarification and symptom management.  I spent 30 minutes providing this consultation.  More than 50% of the time in this consultation was spent on coordinating communication HISTORY OF PRESENT ILLNESS:Tammy L Godboltis a 89year oldfemalewith multiple medical problems includingintermittent diarrhea,pulmonary hypertension,Chronic diastolic CHF,OSA, chronic respiratory failure with hypoxia, SIADH. Palliative Care was asked to help address goals of care.   CODE STATUS: Full  PPS: 50% HOSPICE ELIGIBILITY/DIAGNOSIS:  TBD  PAST MEDICAL HISTORY:  Past Medical History:  Diagnosis Date  . Anxiety   . Chronic lower back pain   . Constipation    h/o fecal disimpaction on 2017  . CVA (cerebral vascular accident) (HCC) 2015   neg workup, MRI small acute lacunar infarcts. ASA only  . Depression   . Diastolic CHF, chronic (HCC)    grade I, most recent echo Jan 2019  . Fatty liver   . GERD (gastroesophageal reflux disease)   . Hyperlipidemia   . Hypertension   . Hyponatremia    multiple episodes with encephalopathy, renal thinks SIADH  . Hypothyroidism, unspecified   . Internal hemorrhoid   . NSTEMI (non-ST elevated myocardial infarction) (HCC) 08/28/2016  . Palpitations   . PFO with atrial septal aneurysm    TTE 2015  . Pneumonia 2016  . Severe pulmonary arterial systolic hypertension (HCC) 12/25/2017   per echo, on 1L oxgen via [place]    SOCIAL HX:  Social History   Tobacco Use  . Smoking status: Never Smoker  . Smokeless tobacco: Former Neurosurgeon    Types: Snuff  . Tobacco comment: "used snuff when I was 19"  Substance Use Topics  . Alcohol use: No    ALLERGIES:  Allergies  Allergen Reactions  . Crestor [Rosuvastatin Calcium] Other (See Comments)    Muscle Aches  . Keflex [Cephalexin] Other (See Comments)    dizziness  . Peanut Butter Flavor Hives  . Sulfa Antibiotics Hives  . Cephalexin Other (See Comments)    dizziness  . Sulfa Drugs Cross Reactors Rash     PERTINENT MEDICATIONS:  Outpatient Encounter Medications as of 12/23/2019  Medication Sig  . albuterol (PROVENTIL) (2.5 MG/3ML) 0.083% nebulizer solution Take 3 mLs (2.5 mg total)  by nebulization every 4 (four) hours as needed for wheezing or shortness of breath.  . ALPRAZolam (XANAX) 1 MG tablet TAKE 1 TABLET(1 MG) BY MOUTH TWICE DAILY AS NEEDED FOR ANXIETY  . amLODipine (NORVASC) 10 MG tablet TAKE 1 TABLET(10 MG) BY MOUTH DAILY  . Aspirin 81 MG EC tablet Take 81 mg by mouth daily.  . cetirizine (ZYRTEC) 10 MG tablet Take 10 mg  by mouth daily.  . clobetasol ointment (TEMOVATE) 0.05 % Apply to affected area every night for 4 weeks, then every other day for 4 weeks and then twice a week for 4 weeks or until resolution.  Marland Kitchen demeclocycline (DECLOMYCIN) 300 MG tablet TAKE 1/2 TABLET BY MOUTH TWICE DAILY  . diclofenac sodium (VOLTAREN) 1 % GEL Apply 2 g topically 4 (four) times daily.  Marland Kitchen docusate sodium (COLACE) 100 MG capsule Take 100 mg by mouth daily as needed for mild constipation.  . fluticasone (FLONASE) 50 MCG/ACT nasal spray Place 1 spray into both nostrils 2 (two) times daily.  Marland Kitchen glycerin adult 2 g suppository Place 1 suppository rectally as needed for constipation.  Marland Kitchen ipratropium (ATROVENT) 0.03 % nasal spray USE 2 SPRAYS IN EACH NOSTRIL TWICE DAILY  . levothyroxine (SYNTHROID) 50 MCG tablet TAKE 1 TABLET(50 MCG) BY MOUTH DAILY BEFORE BREAKFAST  . meclizine (ANTIVERT) 12.5 MG tablet Take 1 tablet (12.5 mg total) by mouth 3 (three) times daily as needed for dizziness.  . nystatin cream (MYCOSTATIN) Apply 1 application topically 2 (two) times daily.  Marland Kitchen olopatadine (PATANOL) 0.1 % ophthalmic solution Place 1 drop into both eyes daily as needed for allergies.  Marland Kitchen omeprazole (PRILOSEC) 20 MG capsule Take 1 capsule (20 mg total) by mouth 2 (two) times daily before a meal.  . propranolol ER (INDERAL LA) 60 MG 24 hr capsule TAKE 1 CAPSULE(60 MG) BY MOUTH DAILY  . sertraline (ZOLOFT) 25 MG tablet TK 1 T PO D  . sucralfate (CARAFATE) 1 g tablet Take 1 tablet (1 g total) by mouth at bedtime.   No facility-administered encounter medications on file as of 12/23/2019.    PHYSICAL EXAM:   General: pleasant, cooperative, in no acute distress Cardiovascular: regular rate and rhythm Pulmonary: clear ant/post fields, normal respiratory effort Abdomen: soft, nontender, + bowel sounds GU: no suprapubic tenderness Extremities: no edema. Skin: stage 2 sacral wound Neurological: Weakness but otherwise nonfocal  Teodoro Spray,  NP

## 2019-12-23 NOTE — Telephone Encounter (Signed)
Called pt back to let her know that Dr. Jolayne Panther sent in a prescription for Diflucan to pharmacy.

## 2019-12-23 NOTE — Telephone Encounter (Signed)
Called and spoke with pt. Pt reports she is still sore, it is a little better but not enough. She reports she has 2 bumps in her vaginal area also, she reports they are still there. Pt wants to know if there is anything else she can take. She reports she is still very sore in her vaginal area.   Informed pt to call Pharmacy to see if she has any refills left as she does not feel like she used them all.   Will message Dr. Jolayne Panther to advise.

## 2019-12-24 ENCOUNTER — Other Ambulatory Visit: Payer: Self-pay | Admitting: Family Medicine

## 2019-12-24 ENCOUNTER — Telehealth: Payer: Self-pay | Admitting: Family Medicine

## 2019-12-24 DIAGNOSIS — R062 Wheezing: Secondary | ICD-10-CM

## 2019-12-24 DIAGNOSIS — R058 Other specified cough: Secondary | ICD-10-CM

## 2019-12-24 DIAGNOSIS — R05 Cough: Secondary | ICD-10-CM

## 2019-12-24 MED ORDER — NEBULIZER DEVI: 0 refills | Status: DC

## 2019-12-24 NOTE — Telephone Encounter (Signed)
Called [facility] an they stated patient did not need a new order for a new nebulizer machine all they have to do is bring in the broken machine and switch out with another  machine. Home Health Rep also stated patient's son was informed of this. I called left a detail msg on patient machine that they did not need a new order just bring the broken machine in to be replaced

## 2019-12-24 NOTE — Telephone Encounter (Signed)
Pt son called please put in an order to [facility] . Her Nebulizer is broken and he is unable to find one. Also, NO ONE from [facility] has come out to treat her bed sore.[facility] KitchenMar[facility]Advanced [facility] still has not received an order (they say). NO antibiotic has been called in for her bed sore please reach out to [employer] [phone]   FR

## 2019-12-24 NOTE — Telephone Encounter (Signed)
Tammy Fernandez )  c/b  Again and pt went to location on [address] NO nebulizer .. he went there yesterday  please c/b other issues were not addressed  FR called pt lvm with what [name] had noted.

## 2019-12-25 ENCOUNTER — Ambulatory Visit: Payer: Medicare Other | Admitting: Cardiovascular Disease

## 2019-12-29 ENCOUNTER — Telehealth (INDEPENDENT_AMBULATORY_CARE_PROVIDER_SITE_OTHER): Payer: Medicare Other | Admitting: Cardiology

## 2019-12-29 ENCOUNTER — Telehealth: Payer: Self-pay

## 2019-12-29 ENCOUNTER — Encounter: Payer: Self-pay | Admitting: Cardiology

## 2019-12-29 VITALS — BP 139/65 | HR 54 | Ht 62.0 in | Wt 156.0 lb

## 2019-12-29 DIAGNOSIS — R002 Palpitations: Secondary | ICD-10-CM | POA: Diagnosis not present

## 2019-12-29 DIAGNOSIS — I5032 Chronic diastolic (congestive) heart failure: Secondary | ICD-10-CM | POA: Diagnosis not present

## 2019-12-29 DIAGNOSIS — I2721 Secondary pulmonary arterial hypertension: Secondary | ICD-10-CM | POA: Diagnosis not present

## 2019-12-29 DIAGNOSIS — J9611 Chronic respiratory failure with hypoxia: Secondary | ICD-10-CM

## 2019-12-29 DIAGNOSIS — Z8673 Personal history of transient ischemic attack (TIA), and cerebral infarction without residual deficits: Secondary | ICD-10-CM

## 2019-12-29 NOTE — Progress Notes (Signed)
Virtual Visit via Telephone Note   This visit type was conducted due to national recommendations for restrictions regarding the COVID-19 Pandemic (e.g. social distancing) in an effort to limit this patient's exposure and mitigate transmission in our community.  Due to her co-morbid illnesses, this patient is at least at moderate risk for complications without adequate follow up.  This format is felt to be most appropriate for this patient at this time.  The patient did not have access to video technology/had technical difficulties with video requiring transitioning to audio format only (telephone).  All issues noted in this document were discussed and addressed.  No physical exam could be performed with this format.  Please refer to the patient's chart for her  consent to telehealth for [facility].   Date:  12/29/2019   ID:  Tammy Fernandez, DOB 1924-03-04, MRN [id number]  Patient Location: Home Provider Location: Office  PCP:  Myles Lipps, MD  Cardiologist:  Nanetta Batty, MD  Electrophysiologist:  None   Evaluation Performed:  Follow-Up Visit  Chief Complaint: Follow-up   History of Present Illness:    Tammy Fernandez is a 86 y.o. female with last encounter March 25, 2019 with [facility].  Past medical history of anxiety chronic lower back pain constipation, CVA negative work-up MR showing small lacunar infarcts 2015, depression, diastolic CHF, grade 1, nonalcoholic fatty liver disease, hyperlipidemia, hypertension, hyponatremia, non-STEMI 2017.  Severe pulmonary artery systolic hypertension.  Hypothyroidism.  Patient states she is doing well.  She denies any recent acute illnesses, hospitalizations, surgeries, travels.  No exposure to Covid virus.  States she has occasional palpitations occurring mostly in the evening.  States the palpitations are usually short-lived but she has days where they occur over the course of the day.  She is asymptomatic with these  palpitations.  She has obstructive sleep apnea and uses CPAP and on home O2 for hypoxia.  States she uses a pedal device at home for exercise and and walks around the house to stay active.  The patient does have symptoms concerning for COVID-19 infection (fever, chills, cough, or new shortness of breath).    Past Medical History:  Diagnosis Date  . Anxiety   . Chronic lower back pain   . Constipation    h/o fecal disimpaction on 2017  . CVA (cerebral vascular accident) (HCC) 2015   neg workup, MRI small acute lacunar infarcts. ASA only  . Depression   . Diastolic CHF, chronic (HCC)    grade I, most recent echo Jan 2019  . Fatty liver   . GERD (gastroesophageal reflux disease)   . Hyperlipidemia   . Hypertension   . Hyponatremia    multiple episodes with encephalopathy, renal thinks SIADH  . Hypothyroidism, unspecified   . Internal hemorrhoid   . NSTEMI (non-ST elevated myocardial infarction) (HCC) 08/28/2016  . Palpitations   . PFO with atrial septal aneurysm    TTE 2015  . Pneumonia 2016  . Severe pulmonary arterial systolic hypertension (HCC) 12/25/2017   per echo, on 1L oxgen via [place]   Past Surgical History:  Procedure Laterality Date  . ABDOMINAL HYSTERECTOMY    . CATARACT EXTRACTION W/ INTRAOCULAR LENS  IMPLANT, BILATERAL Bilateral   . CESAREAN SECTION    . TEE WITHOUT CARDIOVERSION N/A 01/19/2014   Procedure: TRANSESOPHAGEAL ECHOCARDIOGRAM (TEE);  Surgeon: Laurey Morale, MD;  Location: [facility] ENDOSCOPY;  Service: Cardiovascular;  Laterality: N/A;  [name]  . VAGINAL HYSTERECTOMY  Current Meds  Medication Sig  . albuterol (PROVENTIL) (2.5 MG/3ML) 0.083% nebulizer solution Take 3 mLs (2.5 mg total) by nebulization every 4 (four) hours as needed for wheezing or shortness of breath.  . ALPRAZolam (XANAX) 1 MG tablet TAKE 1 TABLET(1 MG) BY MOUTH TWICE DAILY AS NEEDED FOR ANXIETY  . amLODipine (NORVASC) 10 MG tablet TAKE 1 TABLET(10 MG) BY MOUTH DAILY  . Aspirin  81 MG EC tablet Take 81 mg by mouth daily.  . cetirizine (ZYRTEC) 10 MG tablet Take 10 mg by mouth daily.  . clobetasol ointment (TEMOVATE) 0.05 % Apply to affected area every night for 4 weeks, then every other day for 4 weeks and then twice a week for 4 weeks or until resolution.  Marland Kitchen demeclocycline (DECLOMYCIN) 300 MG tablet TAKE 1/2 TABLET BY MOUTH TWICE DAILY  . diclofenac sodium (VOLTAREN) 1 % GEL Apply 2 g topically 4 (four) times daily.  Marland Kitchen docusate sodium (COLACE) 100 MG capsule Take 100 mg by mouth daily as needed for mild constipation.  . fluticasone (FLONASE) 50 MCG/ACT nasal spray Place 1 spray into both nostrils 2 (two) times daily.  Marland Kitchen glycerin adult 2 g suppository Place 1 suppository rectally as needed for constipation.  Marland Kitchen levothyroxine (SYNTHROID) 50 MCG tablet TAKE 1 TABLET(50 MCG) BY MOUTH DAILY BEFORE BREAKFAST  . meclizine (ANTIVERT) 12.5 MG tablet Take 1 tablet (12.5 mg total) by mouth 3 (three) times daily as needed for dizziness.  Marland Kitchen olopatadine (PATANOL) 0.1 % ophthalmic solution Place 1 drop into both eyes daily as needed for allergies.  Marland Kitchen omeprazole (PRILOSEC) 20 MG capsule Take 1 capsule (20 mg total) by mouth 2 (two) times daily before a meal.  . propranolol ER (INDERAL LA) 60 MG 24 hr capsule TAKE 1 CAPSULE(60 MG) BY MOUTH DAILY  . Respiratory Therapy Supplies (NEBULIZER) DEVI Use with nebulized albuterol solution as needed for wheezing, cough or shortness of breath     Allergies:   Crestor [rosuvastatin calcium], Keflex [cephalexin], Peanut butter flavor, Sulfa antibiotics, Cephalexin, and Sulfa drugs cross reactors   Social History   Tobacco Use  . Smoking status: Never Smoker  . Smokeless tobacco: Former Systems developer    Types: Snuff  . Tobacco comment: "used snuff when I was 19"  Substance Use Topics  . Alcohol use: No  . Drug use: No     Family Hx: The patient's family history includes Colon cancer in her cousin and another family member; Diabetes in an other  family member; Healthy in her daughter and son; Heart disease in an other family member; Hypertension in her father; Stroke in her father.  ROS:   Please see the history of present illness All other systems reviewed and are negative.   Prior CV studies:   The following studies were reviewed today:  Carotid artery study 12/30/2018 Right Carotid: Velocities in the right ICA are consistent with a 1-39% stenosis.  Left Carotid: Velocities in the left ICA are consistent with a 1-39% stenosis.  Vertebrals: Bilateral vertebral arteries demonstrate antegrade flow.  12/20/2017  Echo Study Conclusions  - Left ventricle: The cavity size was normal. There was moderate   focal basal hypertrophy. Systolic function was normal. The   estimated ejection fraction was in the range of 60% to 65%. Wall   motion was normal; there were no regional wall motion   abnormalities. Features are consistent with a pseudonormal left   ventricular filling pattern, with concomitant abnormal relaxation   and increased filling pressure (grade 2  diastolic dysfunction).   Doppler parameters are consistent with high ventricular filling   pressure. - Aortic valve: There was mild regurgitation. - Right ventricle: The cavity size was mildly dilated. Wall   thickness was normal. Systolic function was moderately reduced. - Right atrium: The atrium was mildly dilated. - Tricuspid valve: There was moderate regurgitation. - Pulmonary arteries: PA peak pressure: 72 mm Hg (S). - Impressions: The right ventricular systolic pressure was   increased consistent with severe pulmonary hypertension.  Impressions:  - The right ventricular systolic pressure was increased consistent   with severe pulmonary hypertension.  Labs/Other Tests and Data Reviewed:    EKG:  An ECG dated 12/25/2019 was personally reviewed today and demonstrated:  Sinus rhythm 71  Recent Labs: 01/01/2019: Magnesium 2.2 06/02/2019: Hemoglobin 9.9;  Platelets 260 11/05/2019: ALT 10; TSH 1.070 12/10/2019: BUN 13; Creatinine, Ser 0.64; Potassium 5.0; Sodium 131   Recent Lipid Panel Lab Results  Component Value Date/Time   CHOL 181 08/29/2016 04:34 AM   TRIG 98 08/29/2016 04:34 AM   HDL 37 (L) 08/29/2016 04:34 AM   CHOLHDL 4.9 08/29/2016 04:34 AM   LDLCALC 124 (H) 08/29/2016 04:34 AM    Wt Readings from Last 3 Encounters:  12/29/19 156 lb (70.8 kg)  11/05/19 158 lb (71.7 kg)  09/16/19 162 lb (73.5 kg)     Objective:    Vital Signs:  BP 139/65   Pulse (!) 54   Ht 5\' 2"  (1.575 m)   Wt 156 lb (70.8 kg)   BMI 28.53 kg/m    VITAL SIGNS:  reviewed   Patient has a normal speech pattern.  No evidence of dyspnea, cough, or wheezing noted.  ASSESSMENT & PLAN:    1. Chronic respiratory failure with hypoxia (HCC) Followed by pulmonology.  History of severe pulmonary HTN on  Home 02 . ON CPAP d/t OSA  2. Severe pulmonary arterial systolic hypertension (HCC) Last PASP 72 on Echo.   3. Chronic diastolic CHF (congestive heart failure) (HCC) Last echo 2019.  Grade 2 diastolic dysfunction.  Doppler parameters are consistent with high ventricular filling pressure.  4. History of CVA (cerebrovascular accident) Previous CVA in 2015.  No residual focal neurological deficits  5. Palpitations Patient has been complaining recently of palpitations on and off intermittently sometimes transient sometimes long lasting.  He states he has days when she does not have palpitations.  She denies any associated symptoms such as near syncopal or syncopal episodes, shortness of breath, chest pain.  Heart rate today is 54.  Will need an EKG at next follow-up in the office.  COVID-19 Education: The signs and symptoms of COVID-19 were discussed with the patient and how to seek care for testing (follow up with PCP or arrange E-visit).  The importance of social distancing was discussed today.  Time:   Today, I have spent 15 minutes with the patient with  telehealth technology discussing the above problems.     Medication Adjustments/Labs and Tests Ordered: Current medicines are reviewed at length with the patient today.  Concerns regarding medicines are outlined above.   Tests Ordered: No orders of the defined types were placed in this encounter.   Medication Changes: No orders of the defined types were placed in this encounter.   Follow Up:  Virtual Visit  in 6 month(s)  Signed, 2016, NP  12/29/2019 3:21 PM    [facility]

## 2019-12-29 NOTE — Patient Instructions (Signed)
Medication Instructions:  Your physician recommends that you continue on your current medications as directed. Please refer to the Current Medication list given to you today. *If you need a refill on your cardiac medications before your next appointment, please call your pharmacy*  Lab Work: None  If you have labs (blood work) drawn today and your tests are completely normal, you will receive your results only by: . MyChart Message (if you have MyChart) OR . A paper copy in the mail If you have any lab test that is abnormal or we need to change your treatment, we will call you to review the results.  Testing/Procedures: None   Follow-Up: At [facility], you and your health needs are our priority.  As part of our continuing mission to provide you with exceptional heart care, we have created designated Provider Care Teams.  These Care Teams include your primary Cardiologist (physician) and Advanced Practice Providers (APPs -  Physician Assistants and Nurse Practitioners) who all work together to provide you with the care you need, when you need it.  Your next appointment:   6 months  The format for your next appointment:   Virtual Visit   Provider:   Luke Kilroy, PA-C  Other Instructions  

## 2019-12-29 NOTE — Telephone Encounter (Signed)

## 2019-12-29 NOTE — Telephone Encounter (Signed)
Contacted patient's son, James-per [place], to discuss AVS Instructions. Gave patient Luke's recommendations from today's virtual office visit. Informed patient that someone from the scheduling dept will be in contact with them to schedule their follow up appt. Patient's son voiced understanding; AVS printed and mailed to patient.

## 2020-01-02 DIAGNOSIS — B9562 Methicillin resistant Staphylococcus aureus infection as the cause of diseases classified elsewhere: Secondary | ICD-10-CM | POA: Diagnosis not present

## 2020-01-02 DIAGNOSIS — I6789 Other cerebrovascular disease: Secondary | ICD-10-CM | POA: Diagnosis not present

## 2020-01-02 DIAGNOSIS — J9601 Acute respiratory failure with hypoxia: Secondary | ICD-10-CM | POA: Diagnosis not present

## 2020-01-02 DIAGNOSIS — R7881 Bacteremia: Secondary | ICD-10-CM | POA: Diagnosis not present

## 2020-01-04 ENCOUNTER — Ambulatory Visit: Payer: Self-pay | Admitting: *Deleted

## 2020-01-04 NOTE — Telephone Encounter (Signed)
FYI Dr Leretha Pol do you need for me to send a msg to the scheduling pool to get this patient an ov asap

## 2020-01-04 NOTE — Telephone Encounter (Signed)
Pt and her son called stating her BP was 170/95 at 1055; it was 150/71 at 1105; she took her PB meds at [phone]; he says the pt's reading have been like this for the past week; she normally runs 130-140/50s; the pt has been moderate dizziness and mild headache for the past week; she is also having right ear pain; recommendations made per nurse triage protocol; they verbalize understanding; she is seed by Dr Leretha Pol, [place];attempted to contact [facility] x 1 without success; the pt can be contacted at  [phone]; will route to office for final disposition.    Reason for Disposition . Systolic BP  >= 180 OR Diastolic >= 110  Answer Assessment - Initial Assessment Questions 1. BLOOD PRESSURE: "What is the blood pressure?" "Did you take at least two measurements 5 minutes apart?"     170/95 and 150/71 2. ONSET: "When did you take your blood pressure?"     01/04/20 HOW: "How did you obtain the blood pressure?" (e.g., visiting nurse, automatic home BP monitor)     Home cuff right upper arm 4. HISTORY: "Do you have a history of high blood pressure?"  yes 5. MEDICATIONS: "Are you taking any medications for blood pressure?" "Have you missed any doses recently?"     no 6. OTHER SYMPTOMS: "Do you have any symptoms?" (e.g., headache, chest pain, blurred vision, difficulty breathing, weakness)     Intermittent headache "mild" and dizziness "moderate" 7. PREGNANCY: "Is there any chance you are pregnant?" "When was your last menstrual period?"     no  Protocols used: HIGH BLOOD PRESSURE-A-AH

## 2020-01-04 NOTE — Telephone Encounter (Signed)
Patent to cut back on salty foods. Please reschedule feb 9th to sooner in person. thanks

## 2020-01-05 NOTE — Telephone Encounter (Signed)
Spoke to patient's son and he stated patient's bp was running in the high of [phone] but after giving amlodipine 5mg  and amlodipine 10mg  and pantoprazole 60mg  which did bring her bp back down to 129/68 pulse 75. He stated he will continue to monitor and if bp go back up he will bring her in for a sooner appt.

## 2020-01-12 ENCOUNTER — Encounter: Payer: Self-pay | Admitting: Family Medicine

## 2020-01-12 ENCOUNTER — Other Ambulatory Visit: Payer: Self-pay

## 2020-01-12 ENCOUNTER — Ambulatory Visit (INDEPENDENT_AMBULATORY_CARE_PROVIDER_SITE_OTHER): Payer: Medicare Other | Admitting: Family Medicine

## 2020-01-12 VITALS — BP 124/64 | HR 53 | Temp 97.6°F | Ht 62.0 in | Wt 160.0 lb

## 2020-01-12 DIAGNOSIS — I10 Essential (primary) hypertension: Secondary | ICD-10-CM | POA: Diagnosis not present

## 2020-01-12 DIAGNOSIS — Z9981 Dependence on supplemental oxygen: Secondary | ICD-10-CM

## 2020-01-12 DIAGNOSIS — R42 Dizziness and giddiness: Secondary | ICD-10-CM | POA: Diagnosis not present

## 2020-01-12 DIAGNOSIS — M7502 Adhesive capsulitis of left shoulder: Secondary | ICD-10-CM

## 2020-01-12 DIAGNOSIS — J0100 Acute maxillary sinusitis, unspecified: Secondary | ICD-10-CM | POA: Diagnosis not present

## 2020-01-12 DIAGNOSIS — E871 Hypo-osmolality and hyponatremia: Secondary | ICD-10-CM | POA: Diagnosis not present

## 2020-01-12 DIAGNOSIS — I2721 Secondary pulmonary arterial hypertension: Secondary | ICD-10-CM

## 2020-01-12 MED ORDER — HYDRALAZINE HCL 10 MG PO TABS: 10.0000 mg | ORAL_TABLET | Freq: Two times a day (BID) | ORAL | 2 refills | Status: DC

## 2020-01-12 MED ORDER — AMOXICILLIN-POT CLAVULANATE 875-125 MG PO TABS: 1.0000 | ORAL_TABLET | Freq: Two times a day (BID) | ORAL | 0 refills | Status: DC

## 2020-01-12 MED ORDER — MECLIZINE HCL 12.5 MG PO TABS: 12.5000 mg | ORAL_TABLET | Freq: Three times a day (TID) | ORAL | 3 refills | Status: DC | PRN

## 2020-01-12 NOTE — Patient Instructions (Signed)
° ° ° °  If you have lab work done today you will be contacted with your lab results within the next 2 weeks.  If you have not heard from us then please contact us. The fastest way to get your results is to register for My Chart. ° ° °IF you received an x-ray today, you will receive an invoice from [facility]. Please contact [facility] at [phone] with questions or concerns regarding your invoice.  ° °IF you received labwork today, you will receive an invoice from [facility]. Please contact [facility] at [phone] with questions or concerns regarding your invoice.  ° °Our billing staff will not be able to assist you with questions regarding bills from these companies. ° °You will be contacted with the lab results as soon as they are available. The fastest way to get your results is to activate your My Chart account. Instructions are located on the last page of this paperwork. If you have not heard from us regarding the results in 2 weeks, please contact this office. °  ° ° ° °

## 2020-01-12 NOTE — Progress Notes (Signed)
2/9/20212:31 PM  Tammy Fernandez 1924/08/08, 86 y.o., female [id number]  Chief Complaint  Patient presents with  . Hypertension    son is concerned about the bp, brought in monitor to do compare. Pt reading on home device 137/56. son has been giving pt and additional 5mg  of amlodipine. Readings at home have been as high as 170's per son    HPI:   Patient is a 86 y.o. female with past medical history significant for hyponatremia2/2 SIADH, constipation, HTN,dCHF,severepHTN, chronic respiratory failureon oxygen, CVA, GERD and seasonal allergies who presents today for high BP readings at home  Last OV Jan 2020 - no changes  About 2 weeks ago started having high readings at home in 180s,  BP cuff from home matches ours today Son has been giving total amlodipine 15mg  and propranolol ER 60mg  daily  Reflux sign improved since adding probiotics Continues to take omeprazole  Having worsening sinus issues over past several weeks Having worsening forehead and cheeck pressure, doing flonase and zyrtec Requesting refill of meclizine as her dizziness is worse with sinus issues  Uses cpap with humidifier Had to reschedule sleep study  Requesting home PT for decreasing ROM of left shoulder in all directions denies any injury, swelling, tenderness, redness   Lab Results  Component Value Date   CREATININE 0.64 12/10/2019   BUN 13 12/10/2019   NA 131 (L) 12/10/2019   K 5.0 12/10/2019   CL 87 (L) 12/10/2019   CO2 32 (H) 12/10/2019   Lab Results  Component Value Date   TSH 1.070 11/05/2019    BP Readings from Last 3 Encounters:  01/12/20 124/64  12/29/19 139/65  12/10/19 134/60    Depression screen PHQ 2/9 01/12/2020 12/10/2019 11/05/2019  Decreased Interest 0 0 0  Down, Depressed, Hopeless 0 0 0  PHQ - 2 Score 0 0 0  Altered sleeping - - -  Tired, decreased energy - - -  Change in appetite - - -  Feeling bad or failure about yourself  - - -  Trouble concentrating - - -    Moving slowly or fidgety/restless - - -  Suicidal thoughts - - -  PHQ-9 Score - - -  Difficult doing work/chores - - -  Some recent data might be hidden    Fall Risk  12/10/2019 11/19/2019 11/05/2019 10/22/2019 09/21/2019  Falls in the past year? 0 0 0 0 0  Number falls in past yr: 0 0 0 0 0  Injury with Fall? 0 0 0 0 0  Risk for fall due to : - Impaired balance/gait;Impaired mobility - - -  Risk for fall due to: Comment - - - - -  Follow up - Falls evaluation completed;Falls prevention discussed - - -     Allergies  Allergen Reactions  . Crestor [Rosuvastatin Calcium] Other (See Comments)    Muscle Aches  . Keflex [Cephalexin] Other (See Comments)    dizziness  . Peanut Butter Flavor Hives  . Sulfa Antibiotics Hives  . Cephalexin Other (See Comments)    dizziness  . Sulfa Drugs Cross Reactors Rash    Prior to Admission medications   Medication Sig Start Date End Date Taking? Authorizing Provider  albuterol (PROVENTIL) (2.5 MG/3ML) 0.083% nebulizer solution Take 3 mLs (2.5 mg total) by nebulization every 4 (four) hours as needed for wheezing or shortness of breath. 10/13/19  Yes 10/24/2019, MD  ALPRAZolam 09/23/2019) 1 MG tablet TAKE 1 TABLET(1 MG) BY MOUTH TWICE DAILY AS  NEEDED FOR ANXIETY 11/30/19  Yes Myles Lipps, MD  amLODipine (NORVASC) 10 MG tablet TAKE 1 TABLET(10 MG) BY MOUTH DAILY 08/17/19  Yes Myles Lipps, MD  Aspirin 81 MG EC tablet Take 81 mg by mouth daily.   Yes [provider]  cetirizine (ZYRTEC) 10 MG tablet Take 10 mg by mouth daily.   Yes [provider]  clobetasol ointment (TEMOVATE) 0.05 % Apply to affected area every night for 4 weeks, then every other day for 4 weeks and then twice a week for 4 weeks or until resolution. 09/16/19  Yes Constant, Peggy, MD  demeclocycline (DECLOMYCIN) 300 MG tablet TAKE 1/2 TABLET BY MOUTH TWICE DAILY 10/13/19  Yes Myles Lipps, MD  diclofenac sodium (VOLTAREN) 1 % GEL Apply 2 g topically 4  (four) times daily. 06/02/19  Yes Stallings, Zoe A, MD  docusate sodium (COLACE) 100 MG capsule Take 100 mg by mouth daily as needed for mild constipation.   Yes [provider]  fluticasone (FLONASE) 50 MCG/ACT nasal spray Place 1 spray into both nostrils 2 (two) times daily. 11/26/19  Yes Myles Lipps, MD  glycerin adult 2 g suppository Place 1 suppository rectally as needed for constipation. 01/27/19  Yes Myles Lipps, MD  levothyroxine (SYNTHROID) 50 MCG tablet TAKE 1 TABLET(50 MCG) BY MOUTH DAILY BEFORE BREAKFAST 11/07/19  Yes Myles Lipps, MD  meclizine (ANTIVERT) 12.5 MG tablet Take 1 tablet (12.5 mg total) by mouth 3 (three) times daily as needed for dizziness. 08/07/19  Yes Myles Lipps, MD  olopatadine (PATANOL) 0.1 % ophthalmic solution Place 1 drop into both eyes daily as needed for allergies. 11/26/19  Yes Myles Lipps, MD  omeprazole (PRILOSEC) 20 MG capsule Take 1 capsule (20 mg total) by mouth 2 (two) times daily before a meal. 04/03/19  Yes Myles Lipps, MD  propranolol ER (INDERAL LA) 60 MG 24 hr capsule TAKE 1 CAPSULE(60 MG) BY MOUTH DAILY 08/17/19  Yes Myles Lipps, MD  Respiratory Therapy Supplies (NEBULIZER) DEVI Use with nebulized albuterol solution as needed for wheezing, cough or shortness of breath 12/24/19  Yes Myles Lipps, MD    Past Medical History:  Diagnosis Date  . Anxiety   . Chronic lower back pain   . Constipation    h/o fecal disimpaction on 2017  . CVA (cerebral vascular accident) (HCC) 2015   neg workup, MRI small acute lacunar infarcts. ASA only  . Depression   . Diastolic CHF, chronic (HCC)    grade I, most recent echo Jan 2019  . Fatty liver   . GERD (gastroesophageal reflux disease)   . Hyperlipidemia   . Hypertension   . Hyponatremia    multiple episodes with encephalopathy, renal thinks SIADH  . Hypothyroidism, unspecified   . Internal hemorrhoid   . NSTEMI (non-ST elevated myocardial infarction) (HCC)  08/28/2016  . Palpitations   . PFO with atrial septal aneurysm    TTE 2015  . Pneumonia 2016  . Severe pulmonary arterial systolic hypertension (HCC) 12/25/2017   per echo, on 1L oxgen via [place]    Past Surgical History:  Procedure Laterality Date  . ABDOMINAL HYSTERECTOMY    . CATARACT EXTRACTION W/ INTRAOCULAR LENS  IMPLANT, BILATERAL Bilateral   . CESAREAN SECTION    . TEE WITHOUT CARDIOVERSION N/A 01/19/2014   Procedure: TRANSESOPHAGEAL ECHOCARDIOGRAM (TEE);  Surgeon: Laurey Morale, MD;  Location: [facility] ENDOSCOPY;  Service: Cardiovascular;  Laterality: N/A;  [name]  .  VAGINAL HYSTERECTOMY      Social History   Tobacco Use  . Smoking status: Never Smoker  . Smokeless tobacco: Former Neurosurgeon    Types: Snuff  . Tobacco comment: "used snuff when I was 19"  Substance Use Topics  . Alcohol use: No    Family History  Problem Relation Age of Onset  . Stroke Father   . Hypertension Father   . Colon cancer Cousin   . Colon cancer Other        Aunt  . Diabetes Other        aunt and cousins  . Heart disease Other        cousins  . Healthy Daughter   . Healthy Son     Review of Systems  Constitutional: Negative for chills and fever.  Respiratory: Negative for cough and shortness of breath.   Cardiovascular: Negative for chest pain, palpitations and leg swelling.  Gastrointestinal: Negative for abdominal pain, nausea and vomiting.     OBJECTIVE:  Today's Vitals   01/12/20 1419  BP: 124/64  Pulse: (!) 53  Temp: 97.6 F (36.4 C)  SpO2: 99%  Weight: 160 lb (72.6 kg)  Height: 5\' 2"  (1.575 m)   Body mass index is 29.26 kg/m.   Physical Exam Vitals and nursing note reviewed.  Constitutional:      Appearance: She is well-developed.  HENT:     Head: Normocephalic and atraumatic.     Right Ear: Hearing, tympanic membrane, ear canal and external ear normal.     Left Ear: Hearing, tympanic membrane, ear canal and external ear normal.     Nose:     Right Sinus:  Maxillary sinus tenderness and frontal sinus tenderness present.     Left Sinus: Maxillary sinus tenderness and frontal sinus tenderness present.     Mouth/Throat:     Pharynx: No oropharyngeal exudate.  Eyes:     General: No scleral icterus.    Conjunctiva/sclera: Conjunctivae normal.     Pupils: Pupils are equal, round, and reactive to light.  Cardiovascular:     Rate and Rhythm: Normal rate and regular rhythm.     Heart sounds: Normal heart sounds. No murmur. No friction rub. No gallop.   Pulmonary:     Effort: Pulmonary effort is normal.     Breath sounds: Normal breath sounds. No wheezing or rales.  Musculoskeletal:     Cervical back: Neck supple.     Right lower leg: No edema.     Left lower leg: No edema.  Lymphadenopathy:     Cervical: No cervical adenopathy.  Skin:    General: Skin is warm and dry.  Neurological:     Mental Status: She is alert and oriented to person, place, and time.   in wheelchair  No results found for this or any previous visit (from the past 24 hour(s)).  No results found.   ASSESSMENT and PLAN  1. Essential hypertension Discussed max dose of amlodipine is 10mg  a day. Adding low dose hydralazine. Cont home BP monitoring  2. Hyponatremia 2/2 SIADH, fluid restriction, salt tabs and demeoclocycline - Basic metabolic panel  3. Acute non-recurrent maxillary sinusitis Discussed supportive measures, new meds r/se/b and RTC precautions.   4. Adhesive capsulitis of left shoulder Referred to home health  5. Dizziness - meclizine (ANTIVERT) 12.5 MG tablet; Take 1 tablet (12.5 mg total) by mouth 3 (three) times daily as needed for dizziness.  Other orders - hydrALAZINE (APRESOLINE) 10 MG tablet; Take  1 tablet (10 mg total) by mouth 2 (two) times daily. - amoxicillin-clavulanate (AUGMENTIN) 875-125 MG tablet; Take 1 tablet by mouth 2 (two) times daily.  Return in about 4 weeks (around 02/09/2020).    Rutherford Guys, MD Primary Care at  [address], [place] [address] Ph.  [phone] Fax [phone]

## 2020-01-13 ENCOUNTER — Encounter: Payer: Self-pay | Admitting: Family Medicine

## 2020-01-13 DIAGNOSIS — E871 Hypo-osmolality and hyponatremia: Secondary | ICD-10-CM

## 2020-01-13 DIAGNOSIS — E875 Hyperkalemia: Secondary | ICD-10-CM

## 2020-01-13 LAB — BASIC METABOLIC PANEL
BUN/Creatinine Ratio: 24 (ref 12–28)
BUN: 17 mg/dL (ref 10–36)
CO2: 30 mmol/L — ABNORMAL HIGH (ref 20–29)
Calcium: 9.5 mg/dL (ref 8.7–10.3)
Chloride: 82 mmol/L — ABNORMAL LOW (ref 96–106)
Creatinine, Ser: 0.71 mg/dL (ref 0.57–1.00)
GFR calc Af Amer: 85 mL/min/{1.73_m2} (ref >59)
GFR calc non Af Amer: 74 mL/min/{1.73_m2} (ref >59)
Glucose: 86 mg/dL (ref 65–99)
Potassium: 6 mmol/L — ABNORMAL HIGH (ref 3.5–5.2)
Sodium: 126 mmol/L — ABNORMAL LOW (ref 134–144)

## 2020-01-14 NOTE — Telephone Encounter (Signed)
Spoke with son regarding concerns and recommendations per [place]

## 2020-01-18 ENCOUNTER — Other Ambulatory Visit: Payer: Self-pay | Admitting: Family Medicine

## 2020-01-19 ENCOUNTER — Other Ambulatory Visit: Payer: Self-pay

## 2020-01-19 ENCOUNTER — Ambulatory Visit: Payer: Medicare Other

## 2020-01-19 DIAGNOSIS — E871 Hypo-osmolality and hyponatremia: Secondary | ICD-10-CM | POA: Diagnosis not present

## 2020-01-19 DIAGNOSIS — E875 Hyperkalemia: Secondary | ICD-10-CM | POA: Diagnosis not present

## 2020-01-20 LAB — BASIC METABOLIC PANEL
BUN/Creatinine Ratio: 22 (ref 12–28)
BUN: 13 mg/dL (ref 10–36)
CO2: 35 mmol/L — ABNORMAL HIGH (ref 20–29)
Calcium: 9.9 mg/dL (ref 8.7–10.3)
Chloride: 84 mmol/L — ABNORMAL LOW (ref 96–106)
Creatinine, Ser: 0.58 mg/dL (ref 0.57–1.00)
GFR calc Af Amer: 92 mL/min/{1.73_m2} (ref >59)
GFR calc non Af Amer: 80 mL/min/{1.73_m2} (ref >59)
Glucose: 94 mg/dL (ref 65–99)
Potassium: 4.9 mmol/L (ref 3.5–5.2)
Sodium: 130 mmol/L — ABNORMAL LOW (ref 134–144)

## 2020-01-21 ENCOUNTER — Encounter: Payer: Self-pay | Admitting: Family Medicine

## 2020-01-22 NOTE — Telephone Encounter (Signed)
Schedule Tammy Fernandez a office visit for 01/28/2020 at 4:00 pm

## 2020-01-25 IMAGING — DX DG CHEST 1V PORT
1 series · 1 of 1 positions shown · non-contrast
Comparison: 12/24/2017

CLINICAL DATA: Dyspnea on minimal exertion. History of pneumonia,
hypertension, NSTEMI, nonsmoker

EXAM:
PORTABLE CHEST 1 VIEW

[chest ap]
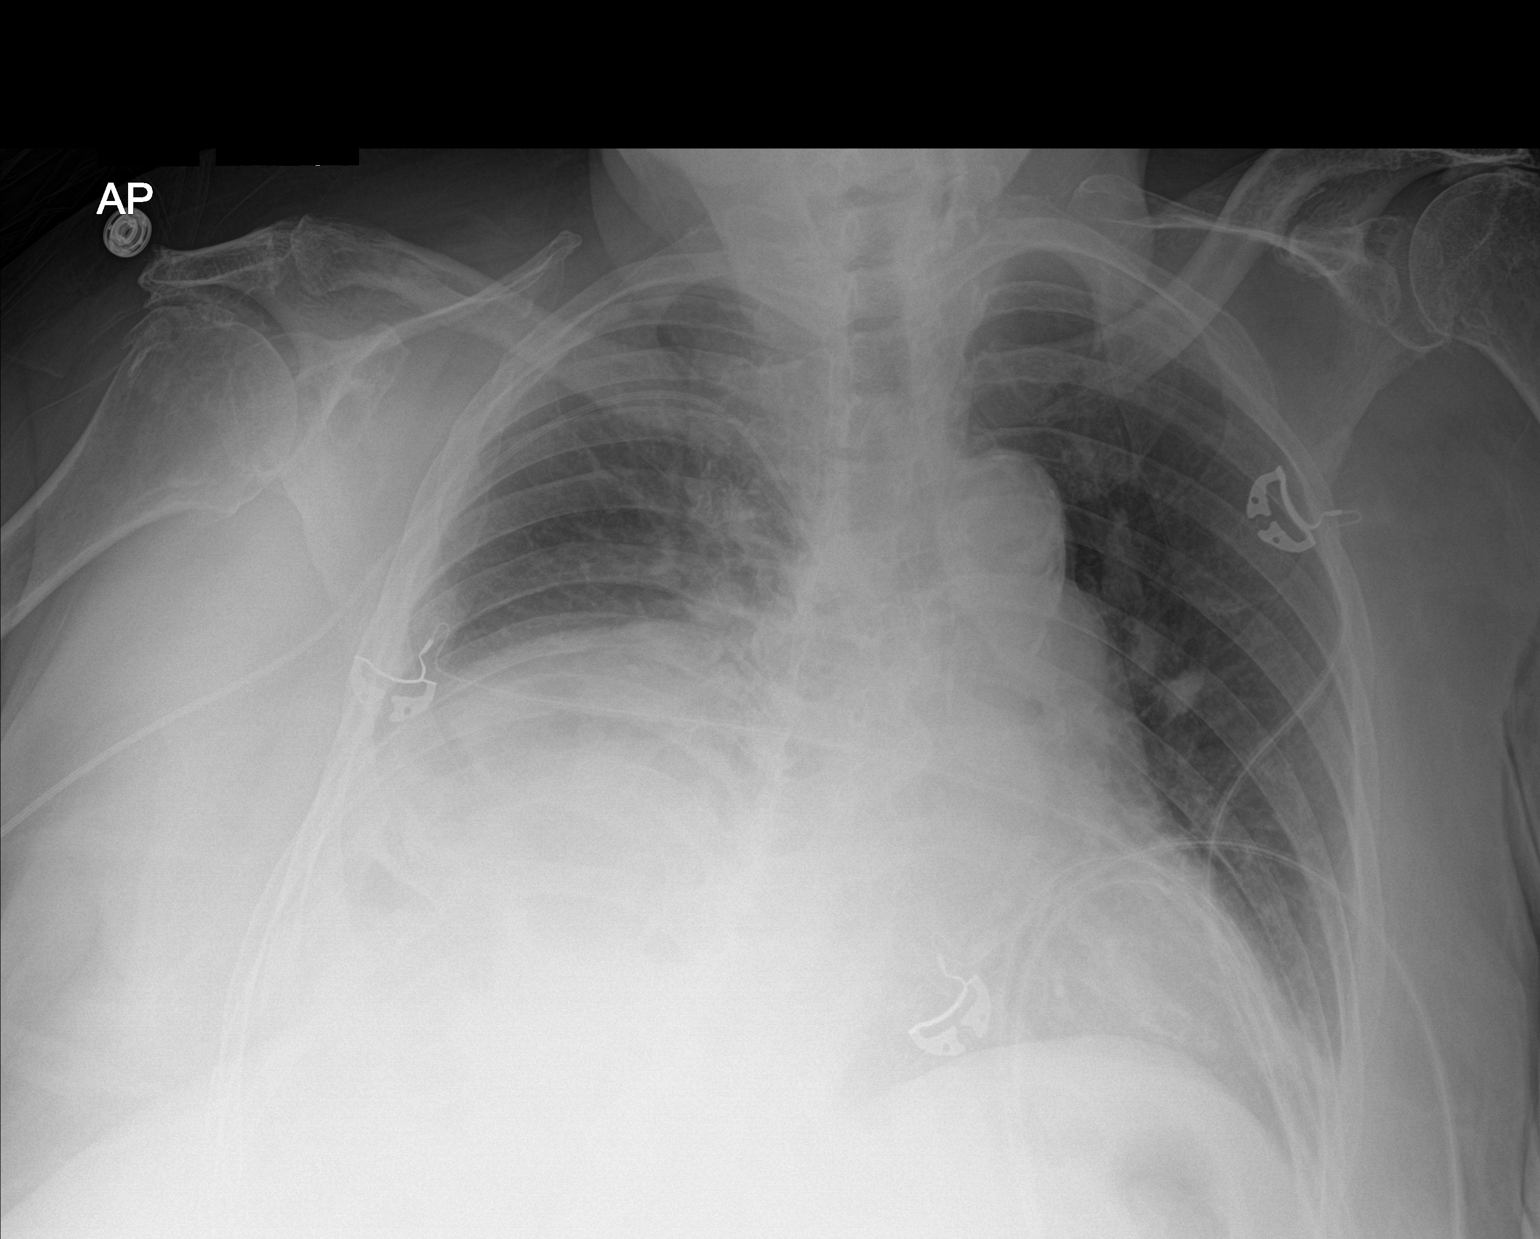

[1 of 1 positions shown; findings below may reference images not displayed]

FINDINGS: Shallow inspiration and patient rotation limits examination.
Elevation of the right hemidiaphragm. Cardiac enlargement without
vascular congestion or edema. No blunting of costophrenic angles. No
pneumothorax. Right PICC catheter with tip over the low SVC region.
Calcification of the aorta. Degenerative changes in the spine and
shoulders. Probable loose bodies in the right subacromial space.
IMPRESSION: Shallow inspiration with elevation of the right hemidiaphragm. Right
PICC line appears in satisfactory position. No evidence of active
pulmonary disease given technique. Aortic atherosclerosis. Similar
appearance to previous study.

## 2020-01-26 ENCOUNTER — Other Ambulatory Visit: Payer: Self-pay | Admitting: Family Medicine

## 2020-01-28 ENCOUNTER — Encounter: Payer: Self-pay | Admitting: Family Medicine

## 2020-01-28 ENCOUNTER — Ambulatory Visit (INDEPENDENT_AMBULATORY_CARE_PROVIDER_SITE_OTHER): Payer: Medicare Other | Admitting: Family Medicine

## 2020-01-28 ENCOUNTER — Other Ambulatory Visit: Payer: Self-pay

## 2020-01-28 VITALS — BP 140/53 | HR 72 | Temp 96.5°F | Resp 18 | Ht 62.0 in | Wt 162.0 lb

## 2020-01-28 DIAGNOSIS — I10 Essential (primary) hypertension: Secondary | ICD-10-CM | POA: Diagnosis not present

## 2020-01-28 MED ORDER — OMEPRAZOLE 20 MG PO CPDR: 20.0000 mg | DELAYED_RELEASE_CAPSULE | Freq: Two times a day (BID) | ORAL | 3 refills | Status: DC

## 2020-01-28 MED ORDER — OLOPATADINE HCL 0.1 % OP SOLN: 1.0000 [drp] | Freq: Every day | OPHTHALMIC | 5 refills | Status: DC | PRN

## 2020-01-28 NOTE — Patient Instructions (Signed)
° ° ° °  If you have lab work done today you will be contacted with your lab results within the next 2 weeks.  If you have not heard from us then please contact us. The fastest way to get your results is to register for My Chart. ° ° °IF you received an x-ray today, you will receive an invoice from [facility]. Please contact [facility] at [phone] with questions or concerns regarding your invoice.  ° °IF you received labwork today, you will receive an invoice from [facility]. Please contact [facility] at [phone] with questions or concerns regarding your invoice.  ° °Our billing staff will not be able to assist you with questions regarding bills from these companies. ° °You will be contacted with the lab results as soon as they are available. The fastest way to get your results is to activate your My Chart account. Instructions are located on the last page of this paperwork. If you have not heard from us regarding the results in 2 weeks, please contact this office. °  ° ° ° °

## 2020-01-28 NOTE — Progress Notes (Signed)
2/25/20214:28 PM  Tammy Fernandez 03-16-1924, 86 y.o., female [id number]  Chief Complaint  Patient presents with  . Hypertension    lab work, PT L shoulder  . vaginal soreness    x 3 months    HPI:   Patient is a 86 y.o. female with past medical history significant for hyponatremia2/2 SIADH, constipation, HTN,dCHF,severepHTN, chronic respiratory failureon oxygen, CVA, GERD and seasonal allergieswho presents today for high BP readings at home  Last OV 2 weeks - started hydralazine 10mg  BID Tolerating hydralazine well  BP readings at home 129/57, 64 - after medication 150/62, 65 - this morning before meds, did not have hydralazine at night 146/64, 65 124/47, 62 134/56, 61 141/59. 63 135/102, 40 132/59, 70 129/57, 64  They have not heard from [facility] PT for her left shoulder  Recently had DME provider look at bipap mask as there was an air leak   Depression screen [facility] 2/9 01/28/2020 01/12/2020 12/10/2019  Decreased Interest 0 0 0  Down, Depressed, Hopeless 0 0 0  PHQ - 2 Score 0 0 0  Altered sleeping - - -  Tired, decreased energy - - -  Change in appetite - - -  Feeling bad or failure about yourself  - - -  Trouble concentrating - - -  Moving slowly or fidgety/restless - - -  Suicidal thoughts - - -  PHQ-9 Score - - -  Difficult doing work/chores - - -  Some recent data might be hidden    Fall Risk  01/28/2020 12/10/2019 11/19/2019 11/05/2019 10/22/2019  Falls in the past year? 0 0 0 0 0  Number falls in past yr: 0 0 0 0 0  Injury with Fall? 0 0 0 0 0  Risk for fall due to : - - Impaired balance/gait;Impaired mobility - -  Risk for fall due to: Comment - - - - -  Follow up Falls evaluation completed - Falls evaluation completed;Falls prevention discussed - -     Allergies  Allergen Reactions  . Crestor [Rosuvastatin Calcium] Other (See Comments)    Muscle Aches  . Keflex [Cephalexin] Other (See Comments)    dizziness  . Peanut Butter Flavor Hives  . Sulfa  Antibiotics Hives  . Cephalexin Other (See Comments)    dizziness  . Sulfa Drugs Cross Reactors Rash    Prior to Admission medications   Medication Sig Start Date End Date Taking? Authorizing Provider  albuterol (PROVENTIL) (2.5 MG/3ML) 0.083% nebulizer solution Take 3 mLs (2.5 mg total) by nebulization every 4 (four) hours as needed for wheezing or shortness of breath. 10/13/19   13/10/20, MD  ALPRAZolam Myles Lipps) 1 MG tablet TAKE 1 TABLET(1 MG) BY MOUTH TWICE DAILY AS NEEDED FOR ANXIETY 11/30/19   12/02/19, MD  amLODipine (NORVASC) 10 MG tablet TAKE 1 TABLET(10 MG) BY MOUTH DAILY 01/18/20   01/20/20, MD  amoxicillin-clavulanate (AUGMENTIN) 875-125 MG tablet Take 1 tablet by mouth 2 (two) times daily. 01/12/20   03/11/20, MD  Aspirin 81 MG EC tablet Take 81 mg by mouth daily.    [provider]  cetirizine (ZYRTEC) 10 MG tablet Take 10 mg by mouth daily.    [provider]  clobetasol ointment (TEMOVATE) 0.05 % Apply to affected area every night for 4 weeks, then every other day for 4 weeks and then twice a week for 4 weeks or until resolution. 09/16/19   Constant, Peggy, MD  demeclocycline (DECLOMYCIN) 300 MG  tablet TAKE 1/2 TABLET BY MOUTH TWICE DAILY 10/13/19   Myles Lipps, MD  diclofenac sodium (VOLTAREN) 1 % GEL Apply 2 g topically 4 (four) times daily. 06/02/19   Doristine Bosworth, MD  docusate sodium (COLACE) 100 MG capsule Take 100 mg by mouth daily as needed for mild constipation.    [provider]  fluticasone (FLONASE) 50 MCG/ACT nasal spray Place 1 spray into both nostrils 2 (two) times daily. 11/26/19   Myles Lipps, MD  glycerin adult 2 g suppository Place 1 suppository rectally as needed for constipation. 01/27/19   Myles Lipps, MD  hydrALAZINE (APRESOLINE) 10 MG tablet Take 1 tablet (10 mg total) by mouth 2 (two) times daily. 01/12/20   Myles Lipps, MD  levothyroxine (SYNTHROID) 50 MCG tablet TAKE 1 TABLET(50  MCG) BY MOUTH DAILY BEFORE BREAKFAST 11/07/19   Myles Lipps, MD  meclizine (ANTIVERT) 12.5 MG tablet Take 1 tablet (12.5 mg total) by mouth 3 (three) times daily as needed for dizziness. 01/12/20   Myles Lipps, MD  olopatadine (PATANOL) 0.1 % ophthalmic solution Place 1 drop into both eyes daily as needed for allergies. 11/26/19   Myles Lipps, MD  omeprazole (PRILOSEC) 20 MG capsule Take 1 capsule (20 mg total) by mouth 2 (two) times daily before a meal. 04/03/19   Myles Lipps, MD  propranolol ER (INDERAL LA) 60 MG 24 hr capsule TAKE 1 CAPSULE(60 MG) BY MOUTH DAILY 08/17/19   Myles Lipps, MD  Respiratory Therapy Supplies (NEBULIZER) DEVI Use with nebulized albuterol solution as needed for wheezing, cough or shortness of breath 12/24/19   Myles Lipps, MD    Past Medical History:  Diagnosis Date  . Anxiety   . Chronic lower back pain   . Constipation    h/o fecal disimpaction on 2017  . CVA (cerebral vascular accident) (HCC) 2015   neg workup, MRI small acute lacunar infarcts. ASA only  . Depression   . Diastolic CHF, chronic (HCC)    grade I, most recent echo Jan 2019  . Fatty liver   . GERD (gastroesophageal reflux disease)   . Hyperlipidemia   . Hypertension   . Hyponatremia    multiple episodes with encephalopathy, renal thinks SIADH  . Hypothyroidism, unspecified   . Internal hemorrhoid   . NSTEMI (non-ST elevated myocardial infarction) (HCC) 08/28/2016  . Palpitations   . PFO with atrial septal aneurysm    TTE 2015  . Pneumonia 2016  . Severe pulmonary arterial systolic hypertension (HCC) 12/25/2017   per echo, on 1L oxgen via [place]    Past Surgical History:  Procedure Laterality Date  . ABDOMINAL HYSTERECTOMY    . CATARACT EXTRACTION W/ INTRAOCULAR LENS  IMPLANT, BILATERAL Bilateral   . CESAREAN SECTION    . TEE WITHOUT CARDIOVERSION N/A 01/19/2014   Procedure: TRANSESOPHAGEAL ECHOCARDIOGRAM (TEE);  Surgeon: Laurey Morale, MD;  Location: [facility]  ENDOSCOPY;  Service: Cardiovascular;  Laterality: N/A;  [name]  . VAGINAL HYSTERECTOMY      Social History   Tobacco Use  . Smoking status: Never Smoker  . Smokeless tobacco: Former Neurosurgeon    Types: Snuff  . Tobacco comment: "used snuff when I was 19"  Substance Use Topics  . Alcohol use: No    Family History  Problem Relation Age of Onset  . Stroke Father   . Hypertension Father   . Colon cancer Cousin   . Colon cancer Other  Aunt  . Diabetes Other        aunt and cousins  . Heart disease Other        cousins  . Healthy Daughter   . Healthy Son     Review of Systems  Constitutional: Negative for chills and fever.  Respiratory: Negative for cough and shortness of breath.   Cardiovascular: Negative for chest pain, palpitations and leg swelling.  Gastrointestinal: Negative for abdominal pain, nausea and vomiting.     OBJECTIVE:  Today's Vitals   01/28/20 1624  BP: (!) 140/53  Pulse: 72  Resp: 18  Temp: (!) 96.5 F (35.8 C)  TempSrc: Temporal  SpO2: 100%  Weight: 162 lb (73.5 kg)  Height: 5\' 2"  (1.575 m)   Body mass index is 29.63 kg/m.   Physical Exam Vitals and nursing note reviewed.  Constitutional:      Appearance: She is well-developed.  HENT:     Head: Normocephalic and atraumatic.     Mouth/Throat:     Pharynx: No oropharyngeal exudate.  Eyes:     General: No scleral icterus.    Conjunctiva/sclera: Conjunctivae normal.     Pupils: Pupils are equal, round, and reactive to light.  Cardiovascular:     Rate and Rhythm: Normal rate and regular rhythm.     Heart sounds: Normal heart sounds. No murmur. No friction rub. No gallop.   Pulmonary:     Effort: Pulmonary effort is normal.     Breath sounds: Normal breath sounds. No wheezing or rales.  Musculoskeletal:     Cervical back: Neck supple.     Right lower leg: No edema.     Left lower leg: No edema.  Skin:    General: Skin is warm and dry.  Neurological:     Mental Status: She is  alert and oriented to person, place, and time.     No results found for this or any previous visit (from the past 24 hour(s)).  No results found.   ASSESSMENT and PLAN  1. Essential hypertension Improved. Discussed hydralazine BID, cont with propranolol and amlodipine. Cont with home BP monitoring  Will have staff reach out to [facility] Discussed use of egg crate foam mattress given sedentary most time   Other orders - olopatadine (PATANOL) 0.1 % ophthalmic solution; Place 1 drop into both eyes daily as needed for allergies. - omeprazole (PRILOSEC) 20 MG capsule; Take 1 capsule (20 mg total) by mouth 2 (two) times daily before a meal.  Return for as scheduled.    Rutherford Guys, MD Primary Care at [address], [place] [address] Ph.  [phone] Fax [phone]

## 2020-01-31 DIAGNOSIS — I6789 Other cerebrovascular disease: Secondary | ICD-10-CM | POA: Diagnosis not present

## 2020-01-31 DIAGNOSIS — J9601 Acute respiratory failure with hypoxia: Secondary | ICD-10-CM | POA: Diagnosis not present

## 2020-01-31 DIAGNOSIS — B9562 Methicillin resistant Staphylococcus aureus infection as the cause of diseases classified elsewhere: Secondary | ICD-10-CM | POA: Diagnosis not present

## 2020-01-31 DIAGNOSIS — R7881 Bacteremia: Secondary | ICD-10-CM | POA: Diagnosis not present

## 2020-02-02 ENCOUNTER — Telehealth: Payer: Self-pay

## 2020-02-02 ENCOUNTER — Telehealth: Payer: Self-pay | Admitting: Obstetrics and Gynecology

## 2020-02-02 MED ORDER — CLOBETASOL PROPIONATE 0.05 % EX OINT: TOPICAL_OINTMENT | CUTANEOUS | 3 refills | Status: DC

## 2020-02-02 MED ORDER — FLUCONAZOLE 150 MG PO TABS: 150.0000 mg | ORAL_TABLET | ORAL | 3 refills | Status: AC

## 2020-02-02 NOTE — Telephone Encounter (Signed)
Notified pt that the provider has permitted refill on her Diflucan and clobetasol cream. Pt reports that she would like enough until she is able to seen by the provider.  Medication e-prescribed per Dr. Jolayne Panther.    Addison Naegeli, RN

## 2020-02-02 NOTE — Telephone Encounter (Signed)
Spoke with Levada Dy at [facility], says pt was discharged from the agency due to goals being met. No skill nursing needed. If there is something else needed, another order will have to be sent

## 2020-02-02 NOTE — Addendum Note (Signed)
Addended by: Faythe Casa on: 02/02/2020 03:53 PM   Modules accepted: Orders

## 2020-02-02 NOTE — Telephone Encounter (Signed)
The patient called in stating she has severe soreness in her vaginal area. She also stated she has bumps and a yeast infection that wont go away. She is being placed on the waitlist as she would like to come in as a gyn visit. However she would like medication sent to the pharmacy today to provide some relief. She visits the [facility] on [address]. The patient would like a call back. The number ending in [phone] is the best number to reach the patient at.

## 2020-02-06 ENCOUNTER — Other Ambulatory Visit: Payer: Self-pay | Admitting: Family Medicine

## 2020-02-09 ENCOUNTER — Encounter: Payer: Self-pay | Admitting: Family Medicine

## 2020-02-09 ENCOUNTER — Other Ambulatory Visit: Payer: Self-pay

## 2020-02-09 ENCOUNTER — Ambulatory Visit (INDEPENDENT_AMBULATORY_CARE_PROVIDER_SITE_OTHER): Payer: Medicare Other | Admitting: Family Medicine

## 2020-02-09 ENCOUNTER — Telehealth: Payer: Self-pay

## 2020-02-09 VITALS — BP 120/48 | HR 74 | Temp 97.6°F

## 2020-02-09 DIAGNOSIS — I10 Essential (primary) hypertension: Secondary | ICD-10-CM

## 2020-02-09 DIAGNOSIS — D638 Anemia in other chronic diseases classified elsewhere: Secondary | ICD-10-CM | POA: Diagnosis not present

## 2020-02-09 DIAGNOSIS — E871 Hypo-osmolality and hyponatremia: Secondary | ICD-10-CM | POA: Diagnosis not present

## 2020-02-09 DIAGNOSIS — E222 Syndrome of inappropriate secretion of antidiuretic hormone: Secondary | ICD-10-CM

## 2020-02-09 DIAGNOSIS — I700 Atherosclerosis of aorta: Secondary | ICD-10-CM

## 2020-02-09 NOTE — Telephone Encounter (Signed)
Order faxed to [facility] for left shoulder pain [phone]

## 2020-02-09 NOTE — Progress Notes (Addendum)
3/9/20213:13 PM  Tammy Fernandez [birth year], 86 y.o., female [id number]  Chief Complaint  Patient presents with  . Hypertension    HPI:   Patient is a 86 y.o. female with past medical history significant for hyponatremia2/2 SIADH, constipation, HTN,dCHF,severepHTN, chronic respiratory failureon oxygen, CVA, GERD and seasonal allergies who presents today for blood pressure  Last OV feb 2021 - increased hydralazine BID Salt tabs 1 1/2 tabs twice a day  Checking BP at home Has been doing well 127-146/59-63 until this morning 152/74 She did not sleep well last night She continues to struggle with cpap mask She is also reporting very dry eyes and blurry vision from left eye, she has not seen eye doctor yet  CT chest angio 2019 - Atherosclerotic calcification of the aorta diffusely   Patient reports muscle pains with statins  Lab Results  Component Value Date   CHOL 181 08/29/2016   HDL 37 (L) 08/29/2016   LDLCALC 124 (H) 08/29/2016   TRIG 98 08/29/2016   CHOLHDL 4.9 08/29/2016    Lab Results  Component Value Date   CREATININE 0.58 01/19/2020   BUN 13 01/19/2020   NA 130 (L) 01/19/2020   K 4.9 01/19/2020   CL 84 (L) 01/19/2020   CO2 35 (H) 01/19/2020    Depression screen PHQ 2/9 02/09/2020 01/28/2020 01/12/2020  Decreased Interest 0 0 0  Down, Depressed, Hopeless 0 0 0  PHQ - 2 Score 0 0 0  Altered sleeping - - -  Tired, decreased energy - - -  Change in appetite - - -  Feeling bad or failure about yourself  - - -  Trouble concentrating - - -  Moving slowly or fidgety/restless - - -  Suicidal thoughts - - -  PHQ-9 Score - - -  Difficult doing work/chores - - -  Some recent data might be hidden    Fall Risk  02/09/2020 01/28/2020 12/10/2019 11/19/2019 11/05/2019  Falls in the past year? 0 0 0 0 0  Number falls in past yr: 0 0 0 0 0  Injury with Fall? 0 0 0 0 0  Risk for fall due to : - - - Impaired balance/gait;Impaired mobility -  Risk for fall due to:  Comment - - - - -  Follow up Falls evaluation completed Falls evaluation completed - Falls evaluation completed;Falls prevention discussed -     Allergies  Allergen Reactions  . Crestor [Rosuvastatin Calcium] Other (See Comments)    Muscle Aches  . Keflex [Cephalexin] Other (See Comments)    dizziness  . Peanut Butter Flavor Hives  . Sulfa Antibiotics Hives  . Cephalexin Other (See Comments)    dizziness  . Sulfa Drugs Cross Reactors Rash    Prior to Admission medications   Medication Sig Start Date End Date Taking? Authorizing Provider  albuterol (PROVENTIL) (2.5 MG/3ML) 0.083% nebulizer solution Take 3 mLs (2.5 mg total) by nebulization every 4 (four) hours as needed for wheezing or shortness of breath. 10/13/19  Yes Rutherford Guys, MD  ALPRAZolam Duanne Moron) 1 MG tablet TAKE 1 TABLET(1 MG) BY MOUTH TWICE DAILY AS NEEDED FOR ANXIETY 11/30/19  Yes Rutherford Guys, MD  amLODipine (NORVASC) 10 MG tablet TAKE 1 TABLET(10 MG) BY MOUTH DAILY 01/18/20  Yes Rutherford Guys, MD  amoxicillin-clavulanate (AUGMENTIN) 875-125 MG tablet Take 1 tablet by mouth 2 (two) times daily. 01/12/20  Yes Rutherford Guys, MD  Aspirin 81 MG EC tablet Take 81 mg by mouth daily.  Yes [provider]  cetirizine (ZYRTEC) 10 MG tablet Take 10 mg by mouth daily.   Yes [provider]  clobetasol ointment (TEMOVATE) 0.05 % Apply to affected area every night for 4 weeks, then every other day for 4 weeks and then twice a week for 4 weeks or until resolution. 02/02/20  Yes Constant, Peggy, MD  demeclocycline (DECLOMYCIN) 300 MG tablet TAKE 1/2 TABLET BY MOUTH TWICE DAILY 10/13/19  Yes Myles Lipps, MD  diclofenac sodium (VOLTAREN) 1 % GEL Apply 2 g topically 4 (four) times daily. 06/02/19  Yes Stallings, Zoe A, MD  docusate sodium (COLACE) 100 MG capsule Take 100 mg by mouth daily as needed for mild constipation.   Yes [provider]  fluconazole (DIFLUCAN) 150 MG tablet Take 1 tablet (150 mg  total) by mouth every 3 (three) days for 3 doses. 02/02/20 02/09/20 Yes Constant, Peggy, MD  fluticasone (FLONASE) 50 MCG/ACT nasal spray Place 1 spray into both nostrils 2 (two) times daily. 11/26/19  Yes Myles Lipps, MD  glycerin adult 2 g suppository Place 1 suppository rectally as needed for constipation. 01/27/19  Yes Myles Lipps, MD  hydrALAZINE (APRESOLINE) 10 MG tablet Take 1 tablet (10 mg total) by mouth 2 (two) times daily. 01/12/20  Yes Myles Lipps, MD  levothyroxine (SYNTHROID) 50 MCG tablet TAKE 1 TABLET(50 MCG) BY MOUTH DAILY BEFORE BREAKFAST 11/07/19  Yes Myles Lipps, MD  meclizine (ANTIVERT) 12.5 MG tablet Take 1 tablet (12.5 mg total) by mouth 3 (three) times daily as needed for dizziness. 01/12/20  Yes Myles Lipps, MD  olopatadine (PATANOL) 0.1 % ophthalmic solution Place 1 drop into both eyes daily as needed for allergies. 01/28/20  Yes Myles Lipps, MD  omeprazole (PRILOSEC) 20 MG capsule Take 1 capsule (20 mg total) by mouth 2 (two) times daily before a meal. 01/28/20  Yes Myles Lipps, MD  propranolol ER (INDERAL LA) 60 MG 24 hr capsule TAKE 1 CAPSULE(60 MG) BY MOUTH DAILY 08/17/19  Yes Myles Lipps, MD  Respiratory Therapy Supplies (NEBULIZER) DEVI Use with nebulized albuterol solution as needed for wheezing, cough or shortness of breath 12/24/19  Yes Myles Lipps, MD    Past Medical History:  Diagnosis Date  . Anxiety   . Chronic lower back pain   . Constipation    h/o fecal disimpaction on 2017  . CVA (cerebral vascular accident) (HCC) 2015   neg workup, MRI small acute lacunar infarcts. ASA only  . Depression   . Diastolic CHF, chronic (HCC)    grade I, most recent echo Jan 2019  . Fatty liver   . GERD (gastroesophageal reflux disease)   . Hyperlipidemia   . Hypertension   . Hyponatremia    multiple episodes with encephalopathy, renal thinks SIADH  . Hypothyroidism, unspecified   . Internal hemorrhoid   . NSTEMI (non-ST elevated  myocardial infarction) (HCC) 08/28/2016  . Palpitations   . PFO with atrial septal aneurysm    TTE 2015  . Pneumonia 2016  . Severe pulmonary arterial systolic hypertension (HCC) 12/25/2017   per echo, on 1L oxgen via [place]    Past Surgical History:  Procedure Laterality Date  . ABDOMINAL HYSTERECTOMY    . CATARACT EXTRACTION W/ INTRAOCULAR LENS  IMPLANT, BILATERAL Bilateral   . CESAREAN SECTION    . TEE WITHOUT CARDIOVERSION N/A 01/19/2014   Procedure: TRANSESOPHAGEAL ECHOCARDIOGRAM (TEE);  Surgeon: Laurey Morale, MD;  Location: [facility] ENDOSCOPY;  Service:  Cardiovascular;  Laterality: N/A;  dayna /ja  . VAGINAL HYSTERECTOMY      Social History   Tobacco Use  . Smoking status: Never Smoker  . Smokeless tobacco: Former Neurosurgeon    Types: Snuff  . Tobacco comment: "used snuff when I was 19"  Substance Use Topics  . Alcohol use: No    Family History  Problem Relation Age of Onset  . Stroke Father   . Hypertension Father   . Colon cancer Cousin   . Colon cancer Other        Aunt  . Diabetes Other        aunt and cousins  . Heart disease Other        cousins  . Healthy Daughter   . Healthy Son     Review of Systems  Constitutional: Negative for chills and fever.  Respiratory: Negative for cough and shortness of breath.   Cardiovascular: Negative for chest pain, palpitations and leg swelling.  Gastrointestinal: Negative for abdominal pain, nausea and vomiting.     OBJECTIVE:  Today's Vitals   02/09/20 1500 02/09/20 1547  BP: (!) 146/58 (!) 120/48  Pulse: 74   Temp: 97.6 F (36.4 C)   SpO2: 96%    There is no height or weight on file to calculate BMI.   Physical Exam Vitals and nursing note reviewed.  Constitutional:      Appearance: She is well-developed.  HENT:     Head: Normocephalic and atraumatic.     Mouth/Throat:     Pharynx: No oropharyngeal exudate.  Eyes:     General: No scleral icterus.    Conjunctiva/sclera: Conjunctivae normal.     Pupils:  Pupils are equal, round, and reactive to light.  Cardiovascular:     Rate and Rhythm: Normal rate and regular rhythm.     Heart sounds: Normal heart sounds. No murmur. No friction rub. No gallop.   Pulmonary:     Effort: Pulmonary effort is normal.     Breath sounds: Normal breath sounds. No wheezing or rales.  Musculoskeletal:     Cervical back: Neck supple.  Skin:    General: Skin is warm and dry.  Neurological:     Mental Status: She is alert and oriented to person, place, and time.     No results found for this or any previous visit (from the past 24 hour(s)).  No results found.   ASSESSMENT and PLAN  1. Essential hypertension BP control acceptable. Cont with current regime. Cont with home BP monitoring  2. Hyponatremia - Basic Metabolic Panel  3. SIADH (syndrome of inappropriate ADH production) (HCC)  4. Anemia of chronic disease - CBC  5. Atherosclerosis of aorta Patient with reported myalgia to statins  Return in about 4 weeks (around 03/08/2020).    Myles Lipps, MD Primary Care at [facility] [address] [place], [place] [address] Ph.  [phone] Fax [phone]

## 2020-02-10 ENCOUNTER — Other Ambulatory Visit: Payer: Medicare Other | Admitting: Hospice

## 2020-02-10 DIAGNOSIS — I5032 Chronic diastolic (congestive) heart failure: Secondary | ICD-10-CM | POA: Diagnosis not present

## 2020-02-10 DIAGNOSIS — Z515 Encounter for palliative care: Secondary | ICD-10-CM | POA: Diagnosis not present

## 2020-02-10 LAB — CBC
Hematocrit: 32.7 % — ABNORMAL LOW (ref 34.0–46.6)
Hemoglobin: 10.3 g/dL — ABNORMAL LOW (ref 11.1–15.9)
MCH: 27.9 pg (ref 26.6–33.0)
MCHC: 31.5 g/dL (ref 31.5–35.7)
MCV: 89 fL (ref 79–97)
Platelets: 291 10*3/uL (ref 150–450)
RBC: 3.69 x10E6/uL — ABNORMAL LOW (ref 3.77–5.28)
RDW: 12.8 % (ref 11.7–15.4)
WBC: 8.1 10*3/uL (ref 3.4–10.8)

## 2020-02-10 LAB — BASIC METABOLIC PANEL
BUN/Creatinine Ratio: 31 — ABNORMAL HIGH (ref 12–28)
BUN: 24 mg/dL (ref 10–36)
CO2: 33 mmol/L — ABNORMAL HIGH (ref 20–29)
Calcium: 10.5 mg/dL — ABNORMAL HIGH (ref 8.7–10.3)
Chloride: 78 mmol/L — ABNORMAL LOW (ref 96–106)
Creatinine, Ser: 0.77 mg/dL (ref 0.57–1.00)
GFR calc Af Amer: 77 mL/min/{1.73_m2} (ref >59)
GFR calc non Af Amer: 67 mL/min/{1.73_m2} (ref >59)
Glucose: 131 mg/dL — ABNORMAL HIGH (ref 65–99)
Potassium: 5.9 mmol/L — ABNORMAL HIGH (ref 3.5–5.2)
Sodium: 127 mmol/L — ABNORMAL LOW (ref 134–144)

## 2020-02-10 NOTE — Progress Notes (Signed)
Therapist, nutritional Palliative Care Consult Note Telephone: [phone]  Fax: [phone]  PATIENT NAME: Tammy Fernandez DOB: Oct 22, 1924 MRN: [id number]  PRIMARY CARE PROVIDER:   Myles Lipps, MD  REFERRING PROVIDER:  Myles Lipps, MD [address] [place],  [place] [address] RESPONSIBLE PARTY:Tammy Fernandez is HCPOA.Tammy Fernandez, son, lives at home with [phone] TELEHEALTH VISIT STATEMENT Due to the COVID-19 crisis, this visit was done via telephone from my office. It was initiated and consented to by this patient and/or family.  Advance Care Planning/Goals of Care: Visit consisted of building trust andfollow up on palliative care. Patient remains a full code withgoals of care includingto maximize quality of life and symptom management. Symptom management:She denied pain/discomfort and no GERD symptoms.  No  respiratory distress. [name] reports incidence of wheezing has  greatly reduced in the past month. She has her breathing treatments and nebulizer. Patient with recent elevated BP in the 180s systolic. Patient on amlodipine 10mg  and propranolol ER 60mg  daily. Saw PCP and low dose hydralazine 10mg  BID added. F/u visit with PCP yesterday, blood pressure responding well to medications. Today, reports blood pressure fluctuates between 130 and 150 systolic. Encouraged making sure that patient actually swallows her medications as ordered and continue to monitor BP at home. Patient with no headaches, dizziness. Patient continues with her mini cycling exercise and ambulation with her rolling walker within her home. Encouraged ongoing supportive care at home.  Follow care will continue to follow patient for goals of care clarification and symptom management. requested in home visit  From NP for 03/02/2020. I spent48 minutes providing this consultation; time includes chart review and documentation. More than 50% of the time in  this consultation was spent on coordinating communication HISTORY OF PRESENT ILLNESS:Tammy L Godboltis a 89year oldfemalewith multiple medical problems includingintermittent diarrhea,pulmonary hypertension,Chronic diastolic CHF,OSA, chronic respiratory failure with hypoxia, SIADH. Palliative Care was asked to help address goals of care.    CODE STATUS: Full  PPS: 50% HOSPICE ELIGIBILITY/DIAGNOSIS: TBD  PAST MEDICAL HISTORY:  Past Medical History:  Diagnosis Date  . Anxiety   . Chronic lower back pain   . Constipation    h/o fecal disimpaction on 2017  . CVA (cerebral vascular accident) (HCC) 2015   neg workup, MRI small acute lacunar infarcts. ASA only  . Depression   . Diastolic CHF, chronic (HCC)    grade I, most recent echo Jan 2019  . Fatty liver   . GERD (gastroesophageal reflux disease)   . Hyperlipidemia   . Hypertension   . Hyponatremia    multiple episodes with encephalopathy, renal thinks SIADH  . Hypothyroidism, unspecified   . Internal hemorrhoid   . NSTEMI (non-ST elevated myocardial infarction) (HCC) 08/28/2016  . Palpitations   . PFO with atrial septal aneurysm    TTE 2015  . Pneumonia 2016  . Severe pulmonary arterial systolic hypertension (HCC) 12/25/2017   per echo, on 1L oxgen via [place]    SOCIAL HX:  Social History   Tobacco Use  . Smoking status: Never Smoker  . Smokeless tobacco: Former 2016    Types: Snuff  . Tobacco comment: "used snuff when I was 19"  Substance Use Topics  . Alcohol use: No    ALLERGIES:  Allergies  Allergen Reactions  . Crestor [Rosuvastatin Calcium] Other (See Comments)    Muscle Aches  . Keflex [Cephalexin] Other (See Comments)    dizziness  . Peanut Butter Flavor Hives  . Sulfa  Antibiotics Hives  . Cephalexin Other (See Comments)    dizziness  . Sulfa Drugs Cross Reactors Rash     PERTINENT MEDICATIONS:  Outpatient Encounter Medications as of 02/10/2020  Medication Sig  . albuterol (PROVENTIL) (2.5  MG/3ML) 0.083% nebulizer solution Take 3 mLs (2.5 mg total) by nebulization every 4 (four) hours as needed for wheezing or shortness of breath.  . ALPRAZolam (XANAX) 1 MG tablet TAKE 1 TABLET(1 MG) BY MOUTH TWICE DAILY AS NEEDED FOR ANXIETY  . amLODipine (NORVASC) 10 MG tablet TAKE 1 TABLET(10 MG) BY MOUTH DAILY  . amoxicillin-clavulanate (AUGMENTIN) 875-125 MG tablet Take 1 tablet by mouth 2 (two) times daily.  . Aspirin 81 MG EC tablet Take 81 mg by mouth daily.  . cetirizine (ZYRTEC) 10 MG tablet Take 10 mg by mouth daily.  . clobetasol ointment (TEMOVATE) 0.05 % Apply to affected area every night for 4 weeks, then every other day for 4 weeks and then twice a week for 4 weeks or until resolution.  Marland Kitchen demeclocycline (DECLOMYCIN) 300 MG tablet TAKE 1/2 TABLET BY MOUTH TWICE DAILY  . diclofenac sodium (VOLTAREN) 1 % GEL Apply 2 g topically 4 (four) times daily.  Marland Kitchen docusate sodium (COLACE) 100 MG capsule Take 100 mg by mouth daily as needed for mild constipation.  . fluticasone (FLONASE) 50 MCG/ACT nasal spray Place 1 spray into both nostrils 2 (two) times daily.  Marland Kitchen glycerin adult 2 g suppository Place 1 suppository rectally as needed for constipation.  . hydrALAZINE (APRESOLINE) 10 MG tablet Take 1 tablet (10 mg total) by mouth 2 (two) times daily.  Marland Kitchen levothyroxine (SYNTHROID) 50 MCG tablet TAKE 1 TABLET(50 MCG) BY MOUTH DAILY BEFORE BREAKFAST  . meclizine (ANTIVERT) 12.5 MG tablet Take 1 tablet (12.5 mg total) by mouth 3 (three) times daily as needed for dizziness.  Marland Kitchen olopatadine (PATANOL) 0.1 % ophthalmic solution Place 1 drop into both eyes daily as needed for allergies.  Marland Kitchen omeprazole (PRILOSEC) 20 MG capsule Take 1 capsule (20 mg total) by mouth 2 (two) times daily before a meal.  . propranolol ER (INDERAL LA) 60 MG 24 hr capsule TAKE 1 CAPSULE(60 MG) BY MOUTH DAILY  . Respiratory Therapy Supplies (NEBULIZER) DEVI Use with nebulized albuterol solution as needed for wheezing, cough or shortness  of breath   No facility-administered encounter medications on file as of 02/10/2020.    Teodoro Spray, NP

## 2020-02-11 ENCOUNTER — Telehealth: Payer: Self-pay | Admitting: Family Medicine

## 2020-02-11 ENCOUNTER — Telehealth: Payer: Self-pay

## 2020-02-11 ENCOUNTER — Encounter: Payer: Self-pay | Admitting: Family Medicine

## 2020-02-11 DIAGNOSIS — E875 Hyperkalemia: Secondary | ICD-10-CM

## 2020-02-11 MED ORDER — SODIUM POLYSTYRENE SULFONATE PO POWD: Freq: Once | ORAL | 0 refills | Status: AC

## 2020-02-11 NOTE — Telephone Encounter (Signed)
Based on the message I received today, they are declining care due to being under staffed.

## 2020-02-11 NOTE — Telephone Encounter (Signed)
copt and paste note from [facility]   [facility] all is well!  We unfortunately are not able to help with this patient at this time due to our staffing capacity. I appreciate you keeping [place] as part of your care continuum! Please feel free to reach out in epic for any other home health needs to me or Angie, I have included her on this message.

## 2020-02-11 NOTE — Telephone Encounter (Signed)
Good morning,  Can you please enter a new home health referral for pt? The current one has expired and I have just now found someone to take her insurance.  Thank you

## 2020-02-11 NOTE — Telephone Encounter (Signed)
Please send to Dr. Leretha Pol, patient's PCP.

## 2020-02-16 ENCOUNTER — Encounter: Payer: Self-pay | Admitting: Family Medicine

## 2020-02-16 ENCOUNTER — Other Ambulatory Visit: Payer: Self-pay | Admitting: Family Medicine

## 2020-02-16 DIAGNOSIS — J9601 Acute respiratory failure with hypoxia: Secondary | ICD-10-CM | POA: Diagnosis not present

## 2020-02-16 DIAGNOSIS — R7881 Bacteremia: Secondary | ICD-10-CM | POA: Diagnosis not present

## 2020-02-16 DIAGNOSIS — J441 Chronic obstructive pulmonary disease with (acute) exacerbation: Secondary | ICD-10-CM | POA: Diagnosis not present

## 2020-02-16 DIAGNOSIS — I6789 Other cerebrovascular disease: Secondary | ICD-10-CM | POA: Diagnosis not present

## 2020-02-16 NOTE — Telephone Encounter (Signed)
Requested Prescriptions  Pending Prescriptions Disp Refills  . propranolol ER (INDERAL LA) 60 MG 24 hr capsule [Pharmacy Med Name: PROPRANOLOL ER 60MG  CAPSULES] 90 capsule 1    Sig: TAKE 1 CAPSULE(60 MG) BY MOUTH DAILY     Cardiovascular:  Beta Blockers Failed - 02/16/2020 11:37 AM      Failed - Last BP in normal range    BP Readings from Last 1 Encounters:  02/09/20 (!) 120/48         Passed - Last Heart Rate in normal range    Pulse Readings from Last 1 Encounters:  02/09/20 74         Passed - Valid encounter within last 6 months    Recent Outpatient Visits          1 week ago Essential hypertension   [facility] at [facility], [facility], MD   2 weeks ago Essential hypertension   [facility] at [facility], [facility], MD   1 month ago Essential hypertension   [facility] at [facility], [facility], MD   2 months ago Gastroesophageal reflux disease without esophagitis   [facility] at [facility], [facility], MD   3 months ago Nocturnal cough with wheeze   [facility] at [facility], [facility], MD      Future Appointments            In 2 weeks [facility], MD Primary Care at [place], [facility]

## 2020-02-18 ENCOUNTER — Encounter: Payer: Self-pay | Admitting: Family Medicine

## 2020-02-18 ENCOUNTER — Ambulatory Visit: Payer: Medicare Other | Admitting: *Deleted

## 2020-02-18 NOTE — Telephone Encounter (Signed)
Pt son calling to report that the medication for bp is causing her bp to lower and she is more tired and sleeping more, son constantly checking bp   Please advise on if she should still keep taking it

## 2020-02-19 NOTE — Telephone Encounter (Signed)
No action needed

## 2020-02-22 ENCOUNTER — Other Ambulatory Visit: Payer: Self-pay | Admitting: Family Medicine

## 2020-02-22 ENCOUNTER — Other Ambulatory Visit: Payer: Self-pay | Admitting: *Deleted

## 2020-02-22 ENCOUNTER — Other Ambulatory Visit: Payer: Self-pay

## 2020-02-22 MED ORDER — ALBUTEROL SULFATE (2.5 MG/3ML) 0.083% IN NEBU: 2.5000 mg | INHALATION_SOLUTION | RESPIRATORY_TRACT | 0 refills | Status: DC | PRN

## 2020-02-22 NOTE — Telephone Encounter (Signed)
Copied from CRM [id number]. Topic: Quick Communication - Rx Refill/Question >> Feb 22, 2020  1:15 PM Mcneil, Ja-Kwan wrote: Medication: ALPRAZolam (XANAX) 1 MG tablet and albuterol (PROVENTIL) (2.5 MG/3ML) 0.083% nebulizer solution  Has the patient contacted their pharmacy? no  Preferred Pharmacy (with phone number or street name): [id number] - [place], [place] - [address] AT [facility] [address] [facility] [address] Phone: [phone]  Fax: [phone]  Agent: Please be advised that RX refills may take up to 3 business days. We ask that you follow-up with your pharmacy.

## 2020-02-22 NOTE — Telephone Encounter (Signed)
Patient has appointment 3/24

## 2020-02-22 NOTE — Telephone Encounter (Signed)
Pt requesting xanax refill.

## 2020-02-22 NOTE — Telephone Encounter (Signed)
Requested medication (s) are due for refill today -yes  Requested medication (s) are on the active medication list -yes  Future visit scheduled -yes  Last refill: 11/29/20  Notes to clinic: request for non delegated Rx  Requested Prescriptions  Pending Prescriptions Disp Refills   ALPRAZolam (XANAX) 1 MG tablet 60 tablet     Sig: TAKE 1 TABLET(1 MG) BY MOUTH TWICE DAILY AS NEEDED FOR ANXIETY      Not Delegated - Psychiatry:  Anxiolytics/Hypnotics Failed - 02/22/2020  1:18 PM      Failed - This refill cannot be delegated      Failed - Urine Drug Screen completed in last 360 days.      Passed - Valid encounter within last 6 months    Recent Outpatient Visits           1 week ago Essential hypertension   [facility], [facility], MD   3 weeks ago Essential hypertension   [facility], [facility], MD   1 month ago Essential hypertension   [facility] at [facility], [facility], MD   2 months ago Gastroesophageal reflux disease without esophagitis   [facility] at [facility], [facility], MD   3 months ago Nocturnal cough with wheeze   [facility] at [facility], [facility], MD       Future Appointments             In 2 weeks Myles Lipps, MD Primary Care at [place], [facility]             Signed Prescriptions Disp Refills   albuterol (PROVENTIL) (2.5 MG/3ML) 0.083% nebulizer solution 75 mL 0    Sig: Take 3 mLs (2.5 mg total) by nebulization every 4 (four) hours as needed for wheezing or shortness of breath.      Pulmonology:  Beta Agonists Failed - 02/22/2020  1:18 PM      Failed - One inhaler should last at least one month. If the patient is requesting refills earlier, contact the patient to check for uncontrolled symptoms.      Passed - Valid encounter within last 12 months    Recent Outpatient Visits           1 week ago Essential hypertension   [facility] at [facility], [facility], MD   3 weeks ago Essential hypertension   [facility] at [facility], [facility], MD   1 month ago Essential hypertension   [facility] at [facility], [facility], MD   2 months ago Gastroesophageal reflux disease without esophagitis   [facility] at [facility], [facility], MD   3 months ago Nocturnal cough with wheeze   [facility] at [facility], [facility], MD       Future Appointments             In 2 weeks Myles Lipps, MD Primary Care at [place], [facility]                Requested Prescriptions  Pending Prescriptions Disp Refills   ALPRAZolam (XANAX) 1 MG tablet 60 tablet     Sig: TAKE 1 TABLET(1 MG) BY MOUTH TWICE DAILY AS NEEDED FOR ANXIETY      Not Delegated - Psychiatry:  Anxiolytics/Hypnotics Failed - 02/22/2020  1:18 PM      Failed - This refill cannot be delegated      Failed - Urine Drug Screen completed in  last 360 days.      Passed - Valid encounter within last 6 months    Recent Outpatient Visits           1 week ago Essential hypertension   [facility], Lilia Argue, MD   3 weeks ago Essential hypertension   [facility] at [facility], Lilia Argue, MD   1 month ago Essential hypertension   [facility] at [facility], Lilia Argue, MD   2 months ago Gastroesophageal reflux disease without esophagitis   [facility] at [facility], Lilia Argue, MD   3 months ago Nocturnal cough with wheeze   [facility] at [facility], Lilia Argue, MD       Future Appointments             In 2 weeks Rutherford Guys, MD Primary Care at [place], [facility]             Signed Prescriptions Disp Refills   albuterol (PROVENTIL) (2.5 MG/3ML) 0.083% nebulizer solution 75 mL 0    Sig: Take 3 mLs (2.5 mg total) by nebulization every 4 (four) hours as needed for wheezing or shortness of breath.      Pulmonology:  Beta Agonists Failed - 02/22/2020  1:18 PM      Failed - One inhaler should last at least one month. If the patient is requesting refills earlier, contact the patient to check for  uncontrolled symptoms.      Passed - Valid encounter within last 12 months    Recent Outpatient Visits           1 week ago Essential hypertension   [facility] at [facility], Lilia Argue, MD   3 weeks ago Essential hypertension   [facility] at [facility], Lilia Argue, MD   1 month ago Essential hypertension   [facility] at [facility], Lilia Argue, MD   2 months ago Gastroesophageal reflux disease without esophagitis   [facility] at [facility], Lilia Argue, MD   3 months ago Nocturnal cough with wheeze   [facility] at [facility], Lilia Argue, MD       Future Appointments             In 2 weeks Rutherford Guys, MD Primary Care at [place], [facility]

## 2020-02-22 NOTE — Patient Outreach (Signed)
[facility]) Care Management  [facility] Care Manager  02/22/2020   Tammy Fernandez [birth year] [id number]  RN Health Coach telephone call to patient caregiver son Tammy Fernandez..  Hipaa compliance verified. Per Tammy Fernandez she has not had any recent falls. She is using her oxygen on 2 l and 2 1/2 liters when ambulating. Patient appetite is good. Patient is taking medications as per ordered. Patient uses her CPAP on an average of 4-5 hrs a night. Patient is having some fluctuations with blood pressure. Son is monitoring more closely and follow up with Dr. . Patient has a good appetite. Son is wanting patient to get blood drawn and COVID vaccine at home. RN Health Coach will follow up with physician. Patient son has agreed to follow up outreach calls.    Encounter Medications:  Outpatient Encounter Medications as of 02/22/2020  Medication Sig  . albuterol (PROVENTIL) (2.5 MG/3ML) 0.083% nebulizer solution Take 3 mLs (2.5 mg total) by nebulization every 4 (four) hours as needed for wheezing or shortness of breath.  . ALPRAZolam (XANAX) 1 MG tablet TAKE 1 TABLET(1 MG) BY MOUTH TWICE DAILY AS NEEDED FOR ANXIETY  . amLODipine (NORVASC) 10 MG tablet TAKE 1 TABLET(10 MG) BY MOUTH DAILY  . amoxicillin-clavulanate (AUGMENTIN) 875-125 MG tablet Take 1 tablet by mouth 2 (two) times daily.  . Aspirin 81 MG EC tablet Take 81 mg by mouth daily.  . cetirizine (ZYRTEC) 10 MG tablet Take 10 mg by mouth daily.  . clobetasol ointment (TEMOVATE) 0.05 % Apply to affected area every night for 4 weeks, then every other day for 4 weeks and then twice a week for 4 weeks or until resolution.  Marland Kitchen demeclocycline (DECLOMYCIN) 300 MG tablet TAKE 1/2 TABLET BY MOUTH TWICE DAILY  . diclofenac sodium (VOLTAREN) 1 % GEL Apply 2 g topically 4 (four) times daily.  Marland Kitchen docusate sodium (COLACE) 100 MG capsule Take 100 mg by mouth daily as needed for mild constipation.  . fluticasone (FLONASE) 50 MCG/ACT nasal spray Place 1 spray into  both nostrils 2 (two) times daily.  Marland Kitchen glycerin adult 2 g suppository Place 1 suppository rectally as needed for constipation.  . hydrALAZINE (APRESOLINE) 10 MG tablet Take 1 tablet (10 mg total) by mouth 2 (two) times daily.  Marland Kitchen levothyroxine (SYNTHROID) 50 MCG tablet TAKE 1 TABLET(50 MCG) BY MOUTH DAILY BEFORE BREAKFAST  . meclizine (ANTIVERT) 12.5 MG tablet Take 1 tablet (12.5 mg total) by mouth 3 (three) times daily as needed for dizziness.  Marland Kitchen olopatadine (PATANOL) 0.1 % ophthalmic solution Place 1 drop into both eyes daily as needed for allergies.  Marland Kitchen omeprazole (PRILOSEC) 20 MG capsule Take 1 capsule (20 mg total) by mouth 2 (two) times daily before a meal.  . propranolol ER (INDERAL LA) 60 MG 24 hr capsule TAKE 1 CAPSULE(60 MG) BY MOUTH DAILY  . Respiratory Therapy Supplies (NEBULIZER) DEVI Use with nebulized albuterol solution as needed for wheezing, cough or shortness of breath  . [DISCONTINUED] albuterol (PROVENTIL) (2.5 MG/3ML) 0.083% nebulizer solution Take 3 mLs (2.5 mg total) by nebulization every 4 (four) hours as needed for wheezing or shortness of breath.   No facility-administered encounter medications on file as of 02/22/2020.    Functional Status:  In your present state of health, do you have any difficulty performing the following activities: 09/21/2019  Hearing? N  Vision? N  Difficulty concentrating or making decisions? Y  Comment some short term memory loss  Walking or climbing stairs? Y  Comment uses  a walker or wheelchair  Doing errands, shopping? Y  Comment needs Agricultural engineer and eating ? Y  Comment son is caregiver  In the past six months, have you accidently leaked urine? Y  Comment Per son sometimes incont  Do you have problems with loss of bowel control? N  Managing your Medications? Y  Comment son prepares  Managing your Finances? Y  Comment son Estate manager/land agent or managing your Housekeeping? Y  Comment lives with son/ son caregiver   Some recent data might be hidden    Fall/Depression Screening: Fall Risk  02/22/2020 02/09/2020 01/28/2020  Falls in the past year? 0 0 0  Number falls in past yr: 0 0 0  Injury with Fall? 0 0 0  Risk for fall due to : Impaired balance/gait;Impaired mobility - -  Risk for fall due to: Comment - - -  Follow up Falls evaluation completed;Falls prevention discussed Falls evaluation completed Falls evaluation completed   PHQ 2/9 Scores 02/09/2020 01/28/2020 01/12/2020 12/10/2019 11/05/2019 10/22/2019 09/21/2019  PHQ - 2 Score 0 0 0 0 0 0 0  PHQ- 9 Score - - - - - - -   THN CM Care Plan Problem One     Most Recent Value  Care Plan Problem One  Knowledge deficit in self management of CHF  Role Documenting the Problem One  [facility] for Problem One  Active  [facility] Long Term Goal   Patient will not have any admissions within the next 90 days for CHF  Interventions for Problem One Long Term Goal  Rn disussed CHF exacerbation. RN discussed monitoring sodium intabke. RN sent  patient caregiver information on BP. RN  will follow up with further discussion  TH[facility] CM Short Term Goal #1   Patient and son will have a better understanding of Low sodium diet within the next 30 days  [facility] CM Short Term Goal #1 Start Date  02/22/20  Interventions for Short Term Goal #1  RN discussed foods low in sodium. RN sent a picture sheet of foods low in sodium. RN will follow up with further discussion.  THN CM Short Term Goal #2   Patient son will have a better understanding of  dysphagia diet within the next 30 days  [facility]N CM Short Term Goal #2 Start Date  02/22/20  Interventions for Short Term Goal #2  RN discussed with the patient  caregiver about her diet. Patient stated he could use more information on soft diet. RN sent educational material on low salt diet and soft diet. RN will follow up with further discussion.       Assessment:  Patient is having some blood pressure fluctuations Caregiver is taking blood  pressure Patient has not had any recent falls Patient is on a soft diet Patient will benefit from Health Coach telephonic outreach for education and support for congestive heart failure self management.  Plan:  RN discussed the COVID vaccine RN discussed monitoring blood pressure and documenting RN sent picture sheet on foods low and high in sodium RN sent A matter of choice blood pressure control booklet RN will follow up within the month of June  Inita Uram [facility] Care Management [phone]

## 2020-02-22 NOTE — Telephone Encounter (Signed)
Patient is requesting a refill of the following medications: Requested Prescriptions   Pending Prescriptions Disp Refills  . ALPRAZolam (XANAX) 1 MG tablet 60 tablet     Sig: TAKE 1 TABLET(1 MG) BY MOUTH TWICE DAILY AS NEEDED FOR ANXIETY   Signed Prescriptions Disp Refills  . albuterol (PROVENTIL) (2.5 MG/3ML) 0.083% nebulizer solution 75 mL 0    Sig: Take 3 mLs (2.5 mg total) by nebulization every 4 (four) hours as needed for wheezing or shortness of breath.    Authorizing Provider: Leretha Pol, [place] M    Ordering User: Elby Beck F    Date of patient request: 02/22/2020 Last office visit: 02/09/2020 Date of last refill: 11/30/2019 Last refill amount: 60 Tablets 2 refills  Follow up time period per chart: Follow up scheduled

## 2020-02-22 NOTE — Telephone Encounter (Signed)
Patient is requesting a refill of the following medications: Requested Prescriptions   Pending Prescriptions Disp Refills  . ALPRAZolam (XANAX) 1 MG tablet [Pharmacy Med Name: ALPRAZOLAM 1MG  TABLETS] 60 tablet     Sig: TAKE 1 TABLET(1 MG) BY MOUTH TWICE DAILY AS NEEDED FOR ANXIETY    Date of patient request: 02/22/2020 Last office visit: 02/09/2020 Date of last refill: 11/30/2019 Last refill amount: 60 tablets 2 refills Follow up time period per chart: 03/07/2020

## 2020-02-23 ENCOUNTER — Telehealth: Payer: Self-pay | Admitting: Family Medicine

## 2020-02-23 MED ORDER — ALPRAZOLAM 1 MG PO TABS: ORAL_TABLET | ORAL | 0 refills | Status: DC

## 2020-02-23 NOTE — Patient Outreach (Signed)
[facility]) Care Management  02/23/2020  Tammy Fernandez [birth year] [id number]   RN Health Coach telephone call to patient.  Hipaa compliance verified. Per patient son he will go ahead and take his mother for the blood draw at the Dr off tomorrow since he stated he had waited so late to try to find out if it could be done at home. [facility] is available for blood draws. They are not doing the COVID vaccines right now. They will do other vaccines if needed.  Plan: RN sent the patient caregiver [name] the information on [facility] for future use  Tammy Fernandez BSN RN [facility] Care Management [phone]

## 2020-02-24 ENCOUNTER — Other Ambulatory Visit: Payer: Self-pay

## 2020-02-24 ENCOUNTER — Ambulatory Visit (INDEPENDENT_AMBULATORY_CARE_PROVIDER_SITE_OTHER): Payer: Medicare Other | Admitting: Family Medicine

## 2020-02-24 DIAGNOSIS — E875 Hyperkalemia: Secondary | ICD-10-CM | POA: Diagnosis not present

## 2020-02-24 NOTE — Telephone Encounter (Signed)
I have spoken to pt and her son when he came for the lab draw about his medication. He stated understanding when I told him that the medication should be at the pharmacy.

## 2020-02-24 NOTE — Telephone Encounter (Signed)
Patient came in today for a blood draw, and would like to make aware that the medications were denied. Patient son requesting for provider to refill    Please advise

## 2020-02-25 LAB — BASIC METABOLIC PANEL
BUN/Creatinine Ratio: 31 — ABNORMAL HIGH (ref 12–28)
BUN: 21 mg/dL (ref 10–36)
CO2: 30 mmol/L — ABNORMAL HIGH (ref 20–29)
Calcium: 10.3 mg/dL (ref 8.7–10.3)
Chloride: 77 mmol/L — ABNORMAL LOW (ref 96–106)
Creatinine, Ser: 0.68 mg/dL (ref 0.57–1.00)
GFR calc Af Amer: 87 mL/min/{1.73_m2} (ref >59)
GFR calc non Af Amer: 76 mL/min/{1.73_m2} (ref >59)
Glucose: 98 mg/dL (ref 65–99)
Potassium: 4.8 mmol/L (ref 3.5–5.2)
Sodium: 121 mmol/L — ABNORMAL LOW (ref 134–144)

## 2020-02-26 ENCOUNTER — Telehealth: Payer: Self-pay | Admitting: Family Medicine

## 2020-02-26 NOTE — Telephone Encounter (Signed)
Please Advise

## 2020-02-26 NOTE — Telephone Encounter (Signed)
Pt's son and daughter called in to inform office that [facility] told them that they are not taking new patients. They would like to look into mobile care or [facility] as options

## 2020-02-26 NOTE — Telephone Encounter (Signed)
Pt's son called in concerned about pt. He would like for Dr. Leretha Pol to place an order for home health care to go to the house and administer and IV for her low sodium and to also check BP periodically. Please advise

## 2020-02-29 ENCOUNTER — Telehealth: Payer: Self-pay | Admitting: Family Medicine

## 2020-02-29 ENCOUNTER — Other Ambulatory Visit: Payer: Self-pay | Admitting: Family Medicine

## 2020-02-29 DIAGNOSIS — E871 Hypo-osmolality and hyponatremia: Secondary | ICD-10-CM | POA: Diagnosis not present

## 2020-02-29 DIAGNOSIS — Z9981 Dependence on supplemental oxygen: Secondary | ICD-10-CM

## 2020-02-29 DIAGNOSIS — E222 Syndrome of inappropriate secretion of antidiuretic hormone: Secondary | ICD-10-CM

## 2020-02-29 DIAGNOSIS — M7502 Adhesive capsulitis of left shoulder: Secondary | ICD-10-CM

## 2020-02-29 NOTE — Progress Notes (Signed)
Per old referral - able to find [facility] agency to accept patient but given referral was more than 24 days old new referral was needed Patient conditions and needs have not changed She continues to need [facility] services New referral done

## 2020-02-29 NOTE — Telephone Encounter (Signed)
Please ask them to followup with [facility] as recommended by palliative care. thanks

## 2020-02-29 NOTE — Telephone Encounter (Signed)
Please advice next steps for low sodium treatment.

## 2020-02-29 NOTE — Telephone Encounter (Signed)
Need to decreased water in take again by 200c a day. thanks

## 2020-02-29 NOTE — Telephone Encounter (Signed)
Pt son called His said,his talk to a Nurse on Frid 3/26 His mother need some Sodium,nurde told him she will.Marland KitchenMarland KitchenCall The son Please @ [phone]

## 2020-02-29 NOTE — Telephone Encounter (Signed)
Informed pt son of message. He stated understanding.

## 2020-02-29 NOTE — Telephone Encounter (Signed)
I have spoken to the son and they have been speaking with Ms. Francis Pleasant with [facility] to coordinate with [facility]. They have sent [place] a referral form for [place] to complete. I have placed in provider folder.   I have also informed pt about the message to decrease h20 intake by 200 cc or 1 cup.  Pt son stated understanding.

## 2020-03-02 ENCOUNTER — Other Ambulatory Visit: Payer: Medicare Other | Admitting: Hospice

## 2020-03-02 ENCOUNTER — Other Ambulatory Visit: Payer: Self-pay

## 2020-03-02 DIAGNOSIS — Z515 Encounter for palliative care: Secondary | ICD-10-CM | POA: Diagnosis not present

## 2020-03-02 DIAGNOSIS — I5032 Chronic diastolic (congestive) heart failure: Secondary | ICD-10-CM

## 2020-03-02 NOTE — Progress Notes (Signed)
Therapist, nutritional Palliative Care Consult Note Telephone: [phone]  Fax: [phone]  PATIENT NAME: Tammy Fernandez DOB: 10/09/1924 MRN: [id number]  PRIMARY CARE PROVIDER:   Myles Lipps, MD  REFERRING PROVIDER:  Myles Lipps, MD [address] [place],  [place] [address]   RESPONSIBLE PARTY:Tammy Fernandez is HCPOA. Mare Ferrari, son, lives at home with [phone] Recommendations: Advance Care Planning/Goals of Care: Visit consisted of building trust andfollow up on palliative care.Patient remains a full code withgoals of care includingto maximize quality of life and symptom management. Discussed the need to consider reevaluating patient's goals of care as she advances in age, with co morbidities. [name] open to discussion in the future but for now wants everything done to 'help my mother'. Therapeutic listening and validation provided. [name] reported that Gean Maidens RN with [facility] secured [facility] for patient since it is becoming increasingly difficult to take patient outside her home for appointments. Remote recently came out and drew labs and will provide nursing staff for patient's sacral wound.  Symptom management:Patient endorses increasing weakness which she said is related to her hyponatremia/SIADH. Patient is currently on sodium tablets and democlocycline. [name] taking care of her medications to ensure patient gets her correct medications as at when due.VS during visit: BP 130/68 P61 R 18 02 100% at 2L/Min. Patient had recent elevated BP in the 180s systolic. Patient on amlodipine 10mg  and propranolol ER 60mg  daily. Saw PCP and low dose hydralazine 10mg  BID added. Blood pressure responding well to medications per . Encouraged to take BP meds as ordered.  Patient in no acute distress, resting bed; showed how to position her on her side with aid of pillows; this way taking the pressure off her sacrum.  Encouraged ongoing supportive care at home.  Follow care will continue to follow patient for goals of care clarification and symptom management.  I spent48 minutes providing this consultation; time includes chart review and documentation.More than 50% of the time in this consultation was spent on coordinating communication HISTORY OF PRESENT ILLNESS:Tammy L Godboltis a 89year oldfemalewith multiple medical problems includingintermittent diarrhea,pulmonary hypertension,Chronic diastolic CHF,OSA,chronic respiratory failure with hypoxia,SIADH. Palliative Care was asked to help address goals of care.  CODE STATUS: Full  PPS: weak 50% HOSPICE ELIGIBILITY/DIAGNOSIS: TBD  PAST MEDICAL HISTORY:  Past Medical History:  Diagnosis Date  . Anxiety   . Chronic lower back pain   . Constipation    h/o fecal disimpaction on 2017  . CVA (cerebral vascular accident) (HCC) 2015   neg workup, MRI small acute lacunar infarcts. ASA only  . Depression   . Diastolic CHF, chronic (HCC)    grade I, most recent echo Jan 2019  . Fatty liver   . GERD (gastroesophageal reflux disease)   . Hyperlipidemia   . Hypertension   . Hyponatremia    multiple episodes with encephalopathy, renal thinks SIADH  . Hypothyroidism, unspecified   . Internal hemorrhoid   . NSTEMI (non-ST elevated myocardial infarction) (HCC) 08/28/2016  . Palpitations   . PFO with atrial septal aneurysm    TTE 2015  . Pneumonia 2016  . Severe pulmonary arterial systolic hypertension (HCC) 12/25/2017   per echo, on 1L oxgen via [place]    SOCIAL HX:  Social History   Tobacco Use  . Smoking status: Never Smoker  . Smokeless tobacco: Former 2016    Types: Snuff  . Tobacco comment: "used snuff when I was 19"  Substance Use Topics  .  Alcohol use: No    ALLERGIES:  Allergies  Allergen Reactions  . Crestor [Rosuvastatin Calcium] Other (See Comments)    Muscle Aches  . Keflex [Cephalexin] Other (See Comments)      dizziness  . Peanut Butter Flavor Hives  . Sulfa Antibiotics Hives  . Cephalexin Other (See Comments)    dizziness  . Sulfa Drugs Cross Reactors Rash     PERTINENT MEDICATIONS:  Outpatient Encounter Medications as of 03/02/2020  Medication Sig  . albuterol (PROVENTIL) (2.5 MG/3ML) 0.083% nebulizer solution Take 3 mLs (2.5 mg total) by nebulization every 4 (four) hours as needed for wheezing or shortness of breath.  . ALPRAZolam (XANAX) 1 MG tablet TAKE 1 TABLET(1 MG) BY MOUTH TWICE DAILY AS NEEDED FOR ANXIETY  . amLODipine (NORVASC) 10 MG tablet TAKE 1 TABLET(10 MG) BY MOUTH DAILY  . amoxicillin-clavulanate (AUGMENTIN) 875-125 MG tablet Take 1 tablet by mouth 2 (two) times daily.  . Aspirin 81 MG EC tablet Take 81 mg by mouth daily.  . cetirizine (ZYRTEC) 10 MG tablet Take 10 mg by mouth daily.  . clobetasol ointment (TEMOVATE) 0.05 % Apply to affected area every night for 4 weeks, then every other day for 4 weeks and then twice a week for 4 weeks or until resolution.  Marland Kitchen demeclocycline (DECLOMYCIN) 300 MG tablet TAKE 1/2 TABLET BY MOUTH TWICE DAILY  . diclofenac sodium (VOLTAREN) 1 % GEL Apply 2 g topically 4 (four) times daily.  Marland Kitchen docusate sodium (COLACE) 100 MG capsule Take 100 mg by mouth daily as needed for mild constipation.  . fluticasone (FLONASE) 50 MCG/ACT nasal spray Place 1 spray into both nostrils 2 (two) times daily.  Marland Kitchen glycerin adult 2 g suppository Place 1 suppository rectally as needed for constipation.  . hydrALAZINE (APRESOLINE) 10 MG tablet Take 1 tablet (10 mg total) by mouth 2 (two) times daily.  Marland Kitchen levothyroxine (SYNTHROID) 50 MCG tablet TAKE 1 TABLET(50 MCG) BY MOUTH DAILY BEFORE BREAKFAST  . meclizine (ANTIVERT) 12.5 MG tablet Take 1 tablet (12.5 mg total) by mouth 3 (three) times daily as needed for dizziness.  Marland Kitchen olopatadine (PATANOL) 0.1 % ophthalmic solution Place 1 drop into both eyes daily as needed for allergies.  Marland Kitchen omeprazole (PRILOSEC) 20 MG capsule Take 1  capsule (20 mg total) by mouth 2 (two) times daily before a meal.  . propranolol ER (INDERAL LA) 60 MG 24 hr capsule TAKE 1 CAPSULE(60 MG) BY MOUTH DAILY  . Respiratory Therapy Supplies (NEBULIZER) DEVI Use with nebulized albuterol solution as needed for wheezing, cough or shortness of breath   No facility-administered encounter medications on file as of 03/02/2020.    PHYSICAL EXAM/ROS:   General: NAD Cardiovascular: regular rate and rhythm Pulmonary: clear ant/post fields, normal respiratory effort. 02 100% on 02 2L/Min Abdomen: soft, nontender, + bowel sounds GU: no suprapubic tenderness Extremities: no edema. Skin: no rashes to exposed skin, stage 2 sacral wound per [name] Neurological: Weakness but otherwise nonfocal  Teodoro Spray, NP

## 2020-03-03 ENCOUNTER — Telehealth: Payer: Self-pay

## 2020-03-03 DIAGNOSIS — J9601 Acute respiratory failure with hypoxia: Secondary | ICD-10-CM | POA: Diagnosis not present

## 2020-03-03 DIAGNOSIS — B9562 Methicillin resistant Staphylococcus aureus infection as the cause of diseases classified elsewhere: Secondary | ICD-10-CM | POA: Diagnosis not present

## 2020-03-03 DIAGNOSIS — I5032 Chronic diastolic (congestive) heart failure: Secondary | ICD-10-CM | POA: Diagnosis not present

## 2020-03-03 DIAGNOSIS — R7881 Bacteremia: Secondary | ICD-10-CM | POA: Diagnosis not present

## 2020-03-03 DIAGNOSIS — I10 Essential (primary) hypertension: Secondary | ICD-10-CM | POA: Diagnosis not present

## 2020-03-03 DIAGNOSIS — I6789 Other cerebrovascular disease: Secondary | ICD-10-CM | POA: Diagnosis not present

## 2020-03-03 NOTE — Telephone Encounter (Signed)
Forms faxed to [facility] [phone]

## 2020-03-04 DIAGNOSIS — R1310 Dysphagia, unspecified: Secondary | ICD-10-CM | POA: Diagnosis not present

## 2020-03-04 DIAGNOSIS — E785 Hyperlipidemia, unspecified: Secondary | ICD-10-CM | POA: Diagnosis not present

## 2020-03-04 DIAGNOSIS — K219 Gastro-esophageal reflux disease without esophagitis: Secondary | ICD-10-CM | POA: Diagnosis not present

## 2020-03-04 DIAGNOSIS — I5032 Chronic diastolic (congestive) heart failure: Secondary | ICD-10-CM | POA: Diagnosis not present

## 2020-03-04 DIAGNOSIS — E222 Syndrome of inappropriate secretion of antidiuretic hormone: Secondary | ICD-10-CM | POA: Diagnosis not present

## 2020-03-04 DIAGNOSIS — I252 Old myocardial infarction: Secondary | ICD-10-CM | POA: Diagnosis not present

## 2020-03-04 DIAGNOSIS — L89316 Pressure-induced deep tissue damage of right buttock: Secondary | ICD-10-CM | POA: Diagnosis not present

## 2020-03-04 DIAGNOSIS — L89896 Pressure-induced deep tissue damage of other site: Secondary | ICD-10-CM | POA: Diagnosis not present

## 2020-03-04 DIAGNOSIS — J9611 Chronic respiratory failure with hypoxia: Secondary | ICD-10-CM | POA: Diagnosis not present

## 2020-03-04 DIAGNOSIS — M7502 Adhesive capsulitis of left shoulder: Secondary | ICD-10-CM | POA: Diagnosis not present

## 2020-03-04 DIAGNOSIS — M545 Low back pain: Secondary | ICD-10-CM | POA: Diagnosis not present

## 2020-03-04 DIAGNOSIS — Z9981 Dependence on supplemental oxygen: Secondary | ICD-10-CM | POA: Diagnosis not present

## 2020-03-04 DIAGNOSIS — K760 Fatty (change of) liver, not elsewhere classified: Secondary | ICD-10-CM | POA: Diagnosis not present

## 2020-03-04 DIAGNOSIS — I69391 Dysphagia following cerebral infarction: Secondary | ICD-10-CM | POA: Diagnosis not present

## 2020-03-04 DIAGNOSIS — I700 Atherosclerosis of aorta: Secondary | ICD-10-CM | POA: Diagnosis not present

## 2020-03-04 DIAGNOSIS — I272 Pulmonary hypertension, unspecified: Secondary | ICD-10-CM | POA: Diagnosis not present

## 2020-03-04 DIAGNOSIS — E039 Hypothyroidism, unspecified: Secondary | ICD-10-CM | POA: Diagnosis not present

## 2020-03-04 DIAGNOSIS — Z7982 Long term (current) use of aspirin: Secondary | ICD-10-CM | POA: Diagnosis not present

## 2020-03-04 DIAGNOSIS — K5900 Constipation, unspecified: Secondary | ICD-10-CM | POA: Diagnosis not present

## 2020-03-04 DIAGNOSIS — I110 Hypertensive heart disease with heart failure: Secondary | ICD-10-CM | POA: Diagnosis not present

## 2020-03-04 DIAGNOSIS — G8929 Other chronic pain: Secondary | ICD-10-CM | POA: Diagnosis not present

## 2020-03-04 DIAGNOSIS — D649 Anemia, unspecified: Secondary | ICD-10-CM | POA: Diagnosis not present

## 2020-03-06 DIAGNOSIS — Z9981 Dependence on supplemental oxygen: Secondary | ICD-10-CM | POA: Diagnosis not present

## 2020-03-06 DIAGNOSIS — D649 Anemia, unspecified: Secondary | ICD-10-CM | POA: Diagnosis not present

## 2020-03-06 DIAGNOSIS — I110 Hypertensive heart disease with heart failure: Secondary | ICD-10-CM | POA: Diagnosis not present

## 2020-03-06 DIAGNOSIS — I252 Old myocardial infarction: Secondary | ICD-10-CM | POA: Diagnosis not present

## 2020-03-06 DIAGNOSIS — I5032 Chronic diastolic (congestive) heart failure: Secondary | ICD-10-CM | POA: Diagnosis not present

## 2020-03-06 DIAGNOSIS — M7502 Adhesive capsulitis of left shoulder: Secondary | ICD-10-CM | POA: Diagnosis not present

## 2020-03-06 DIAGNOSIS — G8929 Other chronic pain: Secondary | ICD-10-CM | POA: Diagnosis not present

## 2020-03-06 DIAGNOSIS — E039 Hypothyroidism, unspecified: Secondary | ICD-10-CM | POA: Diagnosis not present

## 2020-03-06 DIAGNOSIS — E222 Syndrome of inappropriate secretion of antidiuretic hormone: Secondary | ICD-10-CM | POA: Diagnosis not present

## 2020-03-06 DIAGNOSIS — I272 Pulmonary hypertension, unspecified: Secondary | ICD-10-CM | POA: Diagnosis not present

## 2020-03-06 DIAGNOSIS — I700 Atherosclerosis of aorta: Secondary | ICD-10-CM | POA: Diagnosis not present

## 2020-03-06 DIAGNOSIS — I69391 Dysphagia following cerebral infarction: Secondary | ICD-10-CM | POA: Diagnosis not present

## 2020-03-06 DIAGNOSIS — M545 Low back pain: Secondary | ICD-10-CM | POA: Diagnosis not present

## 2020-03-06 DIAGNOSIS — L89316 Pressure-induced deep tissue damage of right buttock: Secondary | ICD-10-CM | POA: Diagnosis not present

## 2020-03-06 DIAGNOSIS — Z7982 Long term (current) use of aspirin: Secondary | ICD-10-CM | POA: Diagnosis not present

## 2020-03-06 DIAGNOSIS — E785 Hyperlipidemia, unspecified: Secondary | ICD-10-CM | POA: Diagnosis not present

## 2020-03-06 DIAGNOSIS — L89896 Pressure-induced deep tissue damage of other site: Secondary | ICD-10-CM | POA: Diagnosis not present

## 2020-03-06 DIAGNOSIS — K219 Gastro-esophageal reflux disease without esophagitis: Secondary | ICD-10-CM | POA: Diagnosis not present

## 2020-03-06 DIAGNOSIS — J9611 Chronic respiratory failure with hypoxia: Secondary | ICD-10-CM | POA: Diagnosis not present

## 2020-03-06 DIAGNOSIS — K760 Fatty (change of) liver, not elsewhere classified: Secondary | ICD-10-CM | POA: Diagnosis not present

## 2020-03-06 DIAGNOSIS — R1310 Dysphagia, unspecified: Secondary | ICD-10-CM | POA: Diagnosis not present

## 2020-03-06 DIAGNOSIS — K5900 Constipation, unspecified: Secondary | ICD-10-CM | POA: Diagnosis not present

## 2020-03-07 ENCOUNTER — Telehealth: Payer: Self-pay | Admitting: Family Medicine

## 2020-03-07 ENCOUNTER — Ambulatory Visit: Payer: Medicare Other | Admitting: Family Medicine

## 2020-03-07 DIAGNOSIS — D649 Anemia, unspecified: Secondary | ICD-10-CM | POA: Diagnosis not present

## 2020-03-07 DIAGNOSIS — I700 Atherosclerosis of aorta: Secondary | ICD-10-CM | POA: Diagnosis not present

## 2020-03-07 DIAGNOSIS — M7502 Adhesive capsulitis of left shoulder: Secondary | ICD-10-CM | POA: Diagnosis not present

## 2020-03-07 DIAGNOSIS — K219 Gastro-esophageal reflux disease without esophagitis: Secondary | ICD-10-CM | POA: Diagnosis not present

## 2020-03-07 DIAGNOSIS — I5032 Chronic diastolic (congestive) heart failure: Secondary | ICD-10-CM | POA: Diagnosis not present

## 2020-03-07 DIAGNOSIS — I10 Essential (primary) hypertension: Secondary | ICD-10-CM | POA: Diagnosis not present

## 2020-03-07 DIAGNOSIS — E039 Hypothyroidism, unspecified: Secondary | ICD-10-CM | POA: Diagnosis not present

## 2020-03-07 DIAGNOSIS — I110 Hypertensive heart disease with heart failure: Secondary | ICD-10-CM | POA: Diagnosis not present

## 2020-03-07 DIAGNOSIS — R1310 Dysphagia, unspecified: Secondary | ICD-10-CM | POA: Diagnosis not present

## 2020-03-07 DIAGNOSIS — G8929 Other chronic pain: Secondary | ICD-10-CM | POA: Diagnosis not present

## 2020-03-07 DIAGNOSIS — M545 Low back pain: Secondary | ICD-10-CM | POA: Diagnosis not present

## 2020-03-07 DIAGNOSIS — K760 Fatty (change of) liver, not elsewhere classified: Secondary | ICD-10-CM | POA: Diagnosis not present

## 2020-03-07 DIAGNOSIS — E222 Syndrome of inappropriate secretion of antidiuretic hormone: Secondary | ICD-10-CM | POA: Diagnosis not present

## 2020-03-07 DIAGNOSIS — K5900 Constipation, unspecified: Secondary | ICD-10-CM | POA: Diagnosis not present

## 2020-03-07 DIAGNOSIS — E785 Hyperlipidemia, unspecified: Secondary | ICD-10-CM | POA: Diagnosis not present

## 2020-03-07 DIAGNOSIS — Z9981 Dependence on supplemental oxygen: Secondary | ICD-10-CM | POA: Diagnosis not present

## 2020-03-07 DIAGNOSIS — I272 Pulmonary hypertension, unspecified: Secondary | ICD-10-CM | POA: Diagnosis not present

## 2020-03-07 DIAGNOSIS — I69391 Dysphagia following cerebral infarction: Secondary | ICD-10-CM | POA: Diagnosis not present

## 2020-03-07 DIAGNOSIS — L89316 Pressure-induced deep tissue damage of right buttock: Secondary | ICD-10-CM | POA: Diagnosis not present

## 2020-03-07 DIAGNOSIS — J9611 Chronic respiratory failure with hypoxia: Secondary | ICD-10-CM | POA: Diagnosis not present

## 2020-03-07 DIAGNOSIS — I252 Old myocardial infarction: Secondary | ICD-10-CM | POA: Diagnosis not present

## 2020-03-07 DIAGNOSIS — Z7982 Long term (current) use of aspirin: Secondary | ICD-10-CM | POA: Diagnosis not present

## 2020-03-07 DIAGNOSIS — L89896 Pressure-induced deep tissue damage of other site: Secondary | ICD-10-CM | POA: Diagnosis not present

## 2020-03-07 NOTE — Telephone Encounter (Signed)
Crystal called from [facility] needing verbal orders for-skilled nursing one time a week for 8 weeks and an open wound that will need a foam dressings. Please advise at [phone].

## 2020-03-08 ENCOUNTER — Encounter: Payer: Self-pay | Admitting: Family Medicine

## 2020-03-08 NOTE — Telephone Encounter (Signed)
Called and verbal given to crystal

## 2020-03-09 DIAGNOSIS — I5032 Chronic diastolic (congestive) heart failure: Secondary | ICD-10-CM | POA: Diagnosis not present

## 2020-03-09 DIAGNOSIS — R1310 Dysphagia, unspecified: Secondary | ICD-10-CM | POA: Diagnosis not present

## 2020-03-09 DIAGNOSIS — K5900 Constipation, unspecified: Secondary | ICD-10-CM | POA: Diagnosis not present

## 2020-03-09 DIAGNOSIS — D649 Anemia, unspecified: Secondary | ICD-10-CM | POA: Diagnosis not present

## 2020-03-09 DIAGNOSIS — G8929 Other chronic pain: Secondary | ICD-10-CM | POA: Diagnosis not present

## 2020-03-09 DIAGNOSIS — K219 Gastro-esophageal reflux disease without esophagitis: Secondary | ICD-10-CM | POA: Diagnosis not present

## 2020-03-09 DIAGNOSIS — Z9981 Dependence on supplemental oxygen: Secondary | ICD-10-CM | POA: Diagnosis not present

## 2020-03-09 DIAGNOSIS — I700 Atherosclerosis of aorta: Secondary | ICD-10-CM | POA: Diagnosis not present

## 2020-03-09 DIAGNOSIS — M545 Low back pain: Secondary | ICD-10-CM | POA: Diagnosis not present

## 2020-03-09 DIAGNOSIS — L89316 Pressure-induced deep tissue damage of right buttock: Secondary | ICD-10-CM | POA: Diagnosis not present

## 2020-03-09 DIAGNOSIS — M7502 Adhesive capsulitis of left shoulder: Secondary | ICD-10-CM | POA: Diagnosis not present

## 2020-03-09 DIAGNOSIS — E785 Hyperlipidemia, unspecified: Secondary | ICD-10-CM | POA: Diagnosis not present

## 2020-03-09 DIAGNOSIS — K760 Fatty (change of) liver, not elsewhere classified: Secondary | ICD-10-CM | POA: Diagnosis not present

## 2020-03-09 DIAGNOSIS — E039 Hypothyroidism, unspecified: Secondary | ICD-10-CM | POA: Diagnosis not present

## 2020-03-09 DIAGNOSIS — I69391 Dysphagia following cerebral infarction: Secondary | ICD-10-CM | POA: Diagnosis not present

## 2020-03-09 DIAGNOSIS — I252 Old myocardial infarction: Secondary | ICD-10-CM | POA: Diagnosis not present

## 2020-03-09 DIAGNOSIS — L89896 Pressure-induced deep tissue damage of other site: Secondary | ICD-10-CM | POA: Diagnosis not present

## 2020-03-09 DIAGNOSIS — Z7982 Long term (current) use of aspirin: Secondary | ICD-10-CM | POA: Diagnosis not present

## 2020-03-09 DIAGNOSIS — J9611 Chronic respiratory failure with hypoxia: Secondary | ICD-10-CM | POA: Diagnosis not present

## 2020-03-09 DIAGNOSIS — I110 Hypertensive heart disease with heart failure: Secondary | ICD-10-CM | POA: Diagnosis not present

## 2020-03-09 DIAGNOSIS — I272 Pulmonary hypertension, unspecified: Secondary | ICD-10-CM | POA: Diagnosis not present

## 2020-03-09 DIAGNOSIS — E222 Syndrome of inappropriate secretion of antidiuretic hormone: Secondary | ICD-10-CM | POA: Diagnosis not present

## 2020-03-12 ENCOUNTER — Other Ambulatory Visit: Payer: Self-pay | Admitting: Family Medicine

## 2020-03-12 NOTE — Telephone Encounter (Signed)
Requested Prescriptions  Pending Prescriptions Disp Refills  . albuterol (PROVENTIL) (2.5 MG/3ML) 0.083% nebulizer solution [Pharmacy Med Name: ALBUTEROL 0.083%(2.5MG /3ML) 25X3ML] 75 mL 0    Sig: TAKE 3 MILLILITERS BY NEBULIZATION EVERY 4 HOURS AS NEEDED FOR WHEEZING OR SHORTNESS OF BREATH     Pulmonology:  Beta Agonists Failed - 03/12/2020  5:02 PM      Failed - One inhaler should last at least one month. If the patient is requesting refills earlier, contact the patient to check for uncontrolled symptoms.      Passed - Valid encounter within last 12 months    Recent Outpatient Visits          2 weeks ago Hyperkalemia   [facility] at [facility], [facility], MD   1 month ago Essential hypertension   [facility] at [facility], [facility], MD   1 month ago Essential hypertension   [facility], Meda Coffee, MD   2 months ago Essential hypertension   [facility] at [facility], Meda Coffee, MD   3 months ago Gastroesophageal reflux disease without esophagitis   [facility] at [facility], Meda Coffee, MD

## 2020-03-14 DIAGNOSIS — L89316 Pressure-induced deep tissue damage of right buttock: Secondary | ICD-10-CM | POA: Diagnosis not present

## 2020-03-14 DIAGNOSIS — I10 Essential (primary) hypertension: Secondary | ICD-10-CM | POA: Diagnosis not present

## 2020-03-14 DIAGNOSIS — I5032 Chronic diastolic (congestive) heart failure: Secondary | ICD-10-CM | POA: Diagnosis not present

## 2020-03-15 DIAGNOSIS — R1310 Dysphagia, unspecified: Secondary | ICD-10-CM | POA: Diagnosis not present

## 2020-03-15 DIAGNOSIS — E222 Syndrome of inappropriate secretion of antidiuretic hormone: Secondary | ICD-10-CM | POA: Diagnosis not present

## 2020-03-15 DIAGNOSIS — I110 Hypertensive heart disease with heart failure: Secondary | ICD-10-CM | POA: Diagnosis not present

## 2020-03-15 DIAGNOSIS — K219 Gastro-esophageal reflux disease without esophagitis: Secondary | ICD-10-CM | POA: Diagnosis not present

## 2020-03-15 DIAGNOSIS — I252 Old myocardial infarction: Secondary | ICD-10-CM | POA: Diagnosis not present

## 2020-03-15 DIAGNOSIS — I272 Pulmonary hypertension, unspecified: Secondary | ICD-10-CM | POA: Diagnosis not present

## 2020-03-15 DIAGNOSIS — G8929 Other chronic pain: Secondary | ICD-10-CM | POA: Diagnosis not present

## 2020-03-15 DIAGNOSIS — E785 Hyperlipidemia, unspecified: Secondary | ICD-10-CM | POA: Diagnosis not present

## 2020-03-15 DIAGNOSIS — D649 Anemia, unspecified: Secondary | ICD-10-CM | POA: Diagnosis not present

## 2020-03-15 DIAGNOSIS — L89896 Pressure-induced deep tissue damage of other site: Secondary | ICD-10-CM | POA: Diagnosis not present

## 2020-03-15 DIAGNOSIS — M545 Low back pain: Secondary | ICD-10-CM | POA: Diagnosis not present

## 2020-03-15 DIAGNOSIS — J9611 Chronic respiratory failure with hypoxia: Secondary | ICD-10-CM | POA: Diagnosis not present

## 2020-03-15 DIAGNOSIS — E039 Hypothyroidism, unspecified: Secondary | ICD-10-CM | POA: Diagnosis not present

## 2020-03-15 DIAGNOSIS — K760 Fatty (change of) liver, not elsewhere classified: Secondary | ICD-10-CM | POA: Diagnosis not present

## 2020-03-15 DIAGNOSIS — I700 Atherosclerosis of aorta: Secondary | ICD-10-CM | POA: Diagnosis not present

## 2020-03-15 DIAGNOSIS — L89316 Pressure-induced deep tissue damage of right buttock: Secondary | ICD-10-CM | POA: Diagnosis not present

## 2020-03-15 DIAGNOSIS — Z9981 Dependence on supplemental oxygen: Secondary | ICD-10-CM | POA: Diagnosis not present

## 2020-03-15 DIAGNOSIS — I69391 Dysphagia following cerebral infarction: Secondary | ICD-10-CM | POA: Diagnosis not present

## 2020-03-15 DIAGNOSIS — K5900 Constipation, unspecified: Secondary | ICD-10-CM | POA: Diagnosis not present

## 2020-03-15 DIAGNOSIS — I5032 Chronic diastolic (congestive) heart failure: Secondary | ICD-10-CM | POA: Diagnosis not present

## 2020-03-15 DIAGNOSIS — M7502 Adhesive capsulitis of left shoulder: Secondary | ICD-10-CM | POA: Diagnosis not present

## 2020-03-15 DIAGNOSIS — Z7982 Long term (current) use of aspirin: Secondary | ICD-10-CM | POA: Diagnosis not present

## 2020-03-16 ENCOUNTER — Telehealth: Payer: Self-pay | Admitting: Family Medicine

## 2020-03-16 DIAGNOSIS — I5032 Chronic diastolic (congestive) heart failure: Secondary | ICD-10-CM | POA: Diagnosis not present

## 2020-03-16 DIAGNOSIS — I10 Essential (primary) hypertension: Secondary | ICD-10-CM | POA: Diagnosis not present

## 2020-03-16 NOTE — Telephone Encounter (Signed)
Tammy Fernandez from Home health care would like Dr. Leretha Pol to know pt is having complaints of dizziness. During her PT yesterday her bp was 90/52 and pulse rate of 59. This morning her bp was 145/64 and pulse rate of 75  Then  150/66 and pulse rate of 59  Then 140/67 and pule rate of 59.  Pt is taking amLODipine (NORVASC) 10 MG tablet [[id number]],  hydrALAZINE (APRESOLINE) 10 MG tablet [[id number]] one time a day not two and propranolol ER (INDERAL LA) 60 MG 24 hr capsule [[id number]] related with her bp.    Tammy Fernandez would also like an order put in for a speech therapist. Pt has a narrowing of her esophagus and she is having trouble swallowing. Please advise Tammy Fernandez at [phone].

## 2020-03-17 DIAGNOSIS — L89316 Pressure-induced deep tissue damage of right buttock: Secondary | ICD-10-CM | POA: Diagnosis not present

## 2020-03-17 DIAGNOSIS — M7502 Adhesive capsulitis of left shoulder: Secondary | ICD-10-CM | POA: Diagnosis not present

## 2020-03-17 DIAGNOSIS — M545 Low back pain: Secondary | ICD-10-CM | POA: Diagnosis not present

## 2020-03-17 DIAGNOSIS — K219 Gastro-esophageal reflux disease without esophagitis: Secondary | ICD-10-CM | POA: Diagnosis not present

## 2020-03-17 DIAGNOSIS — E039 Hypothyroidism, unspecified: Secondary | ICD-10-CM | POA: Diagnosis not present

## 2020-03-17 DIAGNOSIS — Z9981 Dependence on supplemental oxygen: Secondary | ICD-10-CM | POA: Diagnosis not present

## 2020-03-17 DIAGNOSIS — I110 Hypertensive heart disease with heart failure: Secondary | ICD-10-CM | POA: Diagnosis not present

## 2020-03-17 DIAGNOSIS — Z7982 Long term (current) use of aspirin: Secondary | ICD-10-CM | POA: Diagnosis not present

## 2020-03-17 DIAGNOSIS — G8929 Other chronic pain: Secondary | ICD-10-CM | POA: Diagnosis not present

## 2020-03-17 DIAGNOSIS — K5900 Constipation, unspecified: Secondary | ICD-10-CM | POA: Diagnosis not present

## 2020-03-17 DIAGNOSIS — E222 Syndrome of inappropriate secretion of antidiuretic hormone: Secondary | ICD-10-CM | POA: Diagnosis not present

## 2020-03-17 DIAGNOSIS — I5032 Chronic diastolic (congestive) heart failure: Secondary | ICD-10-CM | POA: Diagnosis not present

## 2020-03-17 DIAGNOSIS — I272 Pulmonary hypertension, unspecified: Secondary | ICD-10-CM | POA: Diagnosis not present

## 2020-03-17 DIAGNOSIS — I252 Old myocardial infarction: Secondary | ICD-10-CM | POA: Diagnosis not present

## 2020-03-17 DIAGNOSIS — I700 Atherosclerosis of aorta: Secondary | ICD-10-CM | POA: Diagnosis not present

## 2020-03-17 DIAGNOSIS — D649 Anemia, unspecified: Secondary | ICD-10-CM | POA: Diagnosis not present

## 2020-03-17 DIAGNOSIS — R1310 Dysphagia, unspecified: Secondary | ICD-10-CM | POA: Diagnosis not present

## 2020-03-17 DIAGNOSIS — I69391 Dysphagia following cerebral infarction: Secondary | ICD-10-CM | POA: Diagnosis not present

## 2020-03-17 DIAGNOSIS — L89896 Pressure-induced deep tissue damage of other site: Secondary | ICD-10-CM | POA: Diagnosis not present

## 2020-03-17 DIAGNOSIS — K760 Fatty (change of) liver, not elsewhere classified: Secondary | ICD-10-CM | POA: Diagnosis not present

## 2020-03-17 DIAGNOSIS — E785 Hyperlipidemia, unspecified: Secondary | ICD-10-CM | POA: Diagnosis not present

## 2020-03-17 DIAGNOSIS — J9611 Chronic respiratory failure with hypoxia: Secondary | ICD-10-CM | POA: Diagnosis not present

## 2020-03-17 NOTE — Telephone Encounter (Signed)
Please Advise on this message. I did speak with Irving Burton to go ahead with the Speech therapist since the pt is having trouble swallowing.

## 2020-03-18 DIAGNOSIS — E785 Hyperlipidemia, unspecified: Secondary | ICD-10-CM | POA: Diagnosis not present

## 2020-03-18 DIAGNOSIS — M545 Low back pain: Secondary | ICD-10-CM | POA: Diagnosis not present

## 2020-03-18 DIAGNOSIS — D649 Anemia, unspecified: Secondary | ICD-10-CM | POA: Diagnosis not present

## 2020-03-18 DIAGNOSIS — J441 Chronic obstructive pulmonary disease with (acute) exacerbation: Secondary | ICD-10-CM | POA: Diagnosis not present

## 2020-03-18 DIAGNOSIS — I69391 Dysphagia following cerebral infarction: Secondary | ICD-10-CM | POA: Diagnosis not present

## 2020-03-18 DIAGNOSIS — I110 Hypertensive heart disease with heart failure: Secondary | ICD-10-CM | POA: Diagnosis not present

## 2020-03-18 DIAGNOSIS — I6789 Other cerebrovascular disease: Secondary | ICD-10-CM | POA: Diagnosis not present

## 2020-03-18 DIAGNOSIS — E222 Syndrome of inappropriate secretion of antidiuretic hormone: Secondary | ICD-10-CM | POA: Diagnosis not present

## 2020-03-18 DIAGNOSIS — I5032 Chronic diastolic (congestive) heart failure: Secondary | ICD-10-CM | POA: Diagnosis not present

## 2020-03-18 DIAGNOSIS — L89896 Pressure-induced deep tissue damage of other site: Secondary | ICD-10-CM | POA: Diagnosis not present

## 2020-03-18 DIAGNOSIS — J9601 Acute respiratory failure with hypoxia: Secondary | ICD-10-CM | POA: Diagnosis not present

## 2020-03-18 DIAGNOSIS — I700 Atherosclerosis of aorta: Secondary | ICD-10-CM | POA: Diagnosis not present

## 2020-03-18 DIAGNOSIS — L89316 Pressure-induced deep tissue damage of right buttock: Secondary | ICD-10-CM | POA: Diagnosis not present

## 2020-03-18 DIAGNOSIS — I252 Old myocardial infarction: Secondary | ICD-10-CM | POA: Diagnosis not present

## 2020-03-18 DIAGNOSIS — R7881 Bacteremia: Secondary | ICD-10-CM | POA: Diagnosis not present

## 2020-03-18 DIAGNOSIS — K5900 Constipation, unspecified: Secondary | ICD-10-CM | POA: Diagnosis not present

## 2020-03-18 DIAGNOSIS — Z7982 Long term (current) use of aspirin: Secondary | ICD-10-CM | POA: Diagnosis not present

## 2020-03-18 DIAGNOSIS — K219 Gastro-esophageal reflux disease without esophagitis: Secondary | ICD-10-CM | POA: Diagnosis not present

## 2020-03-18 DIAGNOSIS — M7502 Adhesive capsulitis of left shoulder: Secondary | ICD-10-CM | POA: Diagnosis not present

## 2020-03-18 DIAGNOSIS — I272 Pulmonary hypertension, unspecified: Secondary | ICD-10-CM | POA: Diagnosis not present

## 2020-03-18 DIAGNOSIS — G8929 Other chronic pain: Secondary | ICD-10-CM | POA: Diagnosis not present

## 2020-03-18 DIAGNOSIS — R1310 Dysphagia, unspecified: Secondary | ICD-10-CM | POA: Diagnosis not present

## 2020-03-18 DIAGNOSIS — K760 Fatty (change of) liver, not elsewhere classified: Secondary | ICD-10-CM | POA: Diagnosis not present

## 2020-03-18 DIAGNOSIS — E039 Hypothyroidism, unspecified: Secondary | ICD-10-CM | POA: Diagnosis not present

## 2020-03-18 DIAGNOSIS — J9611 Chronic respiratory failure with hypoxia: Secondary | ICD-10-CM | POA: Diagnosis not present

## 2020-03-18 DIAGNOSIS — Z9981 Dependence on supplemental oxygen: Secondary | ICD-10-CM | POA: Diagnosis not present

## 2020-03-21 NOTE — Telephone Encounter (Signed)
Agree with Speech Therapy. Regarding BP, I would not make any changes regarding her meds. Continue to monitor for now. She is fluid restricted and has a high salt diet due to her SIADH. Thanks.

## 2020-03-22 ENCOUNTER — Telehealth: Payer: Self-pay | Admitting: Family Medicine

## 2020-03-22 DIAGNOSIS — I110 Hypertensive heart disease with heart failure: Secondary | ICD-10-CM | POA: Diagnosis not present

## 2020-03-22 DIAGNOSIS — K760 Fatty (change of) liver, not elsewhere classified: Secondary | ICD-10-CM | POA: Diagnosis not present

## 2020-03-22 DIAGNOSIS — G8929 Other chronic pain: Secondary | ICD-10-CM | POA: Diagnosis not present

## 2020-03-22 DIAGNOSIS — M545 Low back pain: Secondary | ICD-10-CM | POA: Diagnosis not present

## 2020-03-22 DIAGNOSIS — L89896 Pressure-induced deep tissue damage of other site: Secondary | ICD-10-CM | POA: Diagnosis not present

## 2020-03-22 DIAGNOSIS — Z7982 Long term (current) use of aspirin: Secondary | ICD-10-CM | POA: Diagnosis not present

## 2020-03-22 DIAGNOSIS — I69391 Dysphagia following cerebral infarction: Secondary | ICD-10-CM | POA: Diagnosis not present

## 2020-03-22 DIAGNOSIS — I5032 Chronic diastolic (congestive) heart failure: Secondary | ICD-10-CM | POA: Diagnosis not present

## 2020-03-22 DIAGNOSIS — E039 Hypothyroidism, unspecified: Secondary | ICD-10-CM | POA: Diagnosis not present

## 2020-03-22 DIAGNOSIS — Z9981 Dependence on supplemental oxygen: Secondary | ICD-10-CM | POA: Diagnosis not present

## 2020-03-22 DIAGNOSIS — R1310 Dysphagia, unspecified: Secondary | ICD-10-CM | POA: Diagnosis not present

## 2020-03-22 DIAGNOSIS — I700 Atherosclerosis of aorta: Secondary | ICD-10-CM | POA: Diagnosis not present

## 2020-03-22 DIAGNOSIS — K5900 Constipation, unspecified: Secondary | ICD-10-CM | POA: Diagnosis not present

## 2020-03-22 DIAGNOSIS — I252 Old myocardial infarction: Secondary | ICD-10-CM | POA: Diagnosis not present

## 2020-03-22 DIAGNOSIS — E785 Hyperlipidemia, unspecified: Secondary | ICD-10-CM | POA: Diagnosis not present

## 2020-03-22 DIAGNOSIS — K219 Gastro-esophageal reflux disease without esophagitis: Secondary | ICD-10-CM | POA: Diagnosis not present

## 2020-03-22 DIAGNOSIS — M7502 Adhesive capsulitis of left shoulder: Secondary | ICD-10-CM | POA: Diagnosis not present

## 2020-03-22 DIAGNOSIS — L89316 Pressure-induced deep tissue damage of right buttock: Secondary | ICD-10-CM | POA: Diagnosis not present

## 2020-03-22 DIAGNOSIS — D649 Anemia, unspecified: Secondary | ICD-10-CM | POA: Diagnosis not present

## 2020-03-22 DIAGNOSIS — I272 Pulmonary hypertension, unspecified: Secondary | ICD-10-CM | POA: Diagnosis not present

## 2020-03-22 DIAGNOSIS — E222 Syndrome of inappropriate secretion of antidiuretic hormone: Secondary | ICD-10-CM | POA: Diagnosis not present

## 2020-03-22 DIAGNOSIS — J9611 Chronic respiratory failure with hypoxia: Secondary | ICD-10-CM | POA: Diagnosis not present

## 2020-03-22 NOTE — Telephone Encounter (Signed)
resolved in another message

## 2020-03-22 NOTE — Telephone Encounter (Signed)
Informed Irving Burton of plan.

## 2020-03-22 NOTE — Telephone Encounter (Signed)
Joni Reining called from [facility] is wanting to let us know her BP is drooping wants [place] to look at Meds for adjustment .  Pt also has recent weight loss .Dizzy falling asleep sitting up and dropping items   Please reach out to her sopn And if you have any questions please reach put to [place] at  [phone]

## 2020-03-23 ENCOUNTER — Other Ambulatory Visit: Payer: Self-pay | Admitting: Family Medicine

## 2020-03-23 ENCOUNTER — Ambulatory Visit: Payer: Medicare Other | Admitting: Obstetrics and Gynecology

## 2020-03-23 DIAGNOSIS — I700 Atherosclerosis of aorta: Secondary | ICD-10-CM | POA: Diagnosis not present

## 2020-03-23 DIAGNOSIS — K219 Gastro-esophageal reflux disease without esophagitis: Secondary | ICD-10-CM | POA: Diagnosis not present

## 2020-03-23 DIAGNOSIS — I110 Hypertensive heart disease with heart failure: Secondary | ICD-10-CM | POA: Diagnosis not present

## 2020-03-23 DIAGNOSIS — K5900 Constipation, unspecified: Secondary | ICD-10-CM | POA: Diagnosis not present

## 2020-03-23 DIAGNOSIS — I5032 Chronic diastolic (congestive) heart failure: Secondary | ICD-10-CM | POA: Diagnosis not present

## 2020-03-23 DIAGNOSIS — E222 Syndrome of inappropriate secretion of antidiuretic hormone: Secondary | ICD-10-CM | POA: Diagnosis not present

## 2020-03-23 DIAGNOSIS — R1310 Dysphagia, unspecified: Secondary | ICD-10-CM | POA: Diagnosis not present

## 2020-03-23 DIAGNOSIS — Z9981 Dependence on supplemental oxygen: Secondary | ICD-10-CM | POA: Diagnosis not present

## 2020-03-23 DIAGNOSIS — L89896 Pressure-induced deep tissue damage of other site: Secondary | ICD-10-CM | POA: Diagnosis not present

## 2020-03-23 DIAGNOSIS — M7502 Adhesive capsulitis of left shoulder: Secondary | ICD-10-CM | POA: Diagnosis not present

## 2020-03-23 DIAGNOSIS — I252 Old myocardial infarction: Secondary | ICD-10-CM | POA: Diagnosis not present

## 2020-03-23 DIAGNOSIS — I272 Pulmonary hypertension, unspecified: Secondary | ICD-10-CM | POA: Diagnosis not present

## 2020-03-23 DIAGNOSIS — Z7982 Long term (current) use of aspirin: Secondary | ICD-10-CM | POA: Diagnosis not present

## 2020-03-23 DIAGNOSIS — E039 Hypothyroidism, unspecified: Secondary | ICD-10-CM | POA: Diagnosis not present

## 2020-03-23 DIAGNOSIS — I69391 Dysphagia following cerebral infarction: Secondary | ICD-10-CM | POA: Diagnosis not present

## 2020-03-23 DIAGNOSIS — G8929 Other chronic pain: Secondary | ICD-10-CM | POA: Diagnosis not present

## 2020-03-23 DIAGNOSIS — D649 Anemia, unspecified: Secondary | ICD-10-CM | POA: Diagnosis not present

## 2020-03-23 DIAGNOSIS — K760 Fatty (change of) liver, not elsewhere classified: Secondary | ICD-10-CM | POA: Diagnosis not present

## 2020-03-23 DIAGNOSIS — E785 Hyperlipidemia, unspecified: Secondary | ICD-10-CM | POA: Diagnosis not present

## 2020-03-23 DIAGNOSIS — L89316 Pressure-induced deep tissue damage of right buttock: Secondary | ICD-10-CM | POA: Diagnosis not present

## 2020-03-23 DIAGNOSIS — J9611 Chronic respiratory failure with hypoxia: Secondary | ICD-10-CM | POA: Diagnosis not present

## 2020-03-23 DIAGNOSIS — M545 Low back pain: Secondary | ICD-10-CM | POA: Diagnosis not present

## 2020-03-23 NOTE — Telephone Encounter (Signed)
Requested medication (s) are due for refill today: yes  Requested medication (s) are on the active medication list: yes  Last refill:  02/23/20  Future visit scheduled: no  Notes to clinic:  not delegated    Requested Prescriptions  Pending Prescriptions Disp Refills   ALPRAZolam (XANAX) 1 MG tablet [Pharmacy Med Name: ALPRAZOLAM 1MG  TABLETS] 60 tablet     Sig: TAKE 1 TABLET(1 MG) BY MOUTH TWICE DAILY AS NEEDED FOR ANXIETY      Not Delegated - Psychiatry:  Anxiolytics/Hypnotics Failed - 03/23/2020  1:21 PM      Failed - This refill cannot be delegated      Failed - Urine Drug Screen completed in last 360 days.      Passed - Valid encounter within last 6 months    Recent Outpatient Visits           4 weeks ago Hyperkalemia   [facility] at [facility], [facility], MD   1 month ago Essential hypertension   [facility], MD   1 month ago Essential hypertension   [facility], MD   2 months ago Essential hypertension   [facility], [facility], MD   3 months ago Gastroesophageal reflux disease without esophagitis   [facility] at [facility], [facility], MD

## 2020-03-23 NOTE — Telephone Encounter (Signed)
Patient is requesting a refill of the following medications: Requested Prescriptions   Pending Prescriptions Disp Refills   ALPRAZolam (XANAX) 1 MG tablet [Pharmacy Med Name: ALPRAZOLAM 1MG  TABLETS] 60 tablet     Sig: TAKE 1 TABLET(1 MG) BY MOUTH TWICE DAILY AS NEEDED FOR ANXIETY    Date of patient request: 03/23/20 Last office visit: 02/09/20 Date of last refill: 02/23/20 Last refill amount: 60 Follow up time period per chart: none yet

## 2020-03-24 DIAGNOSIS — K5900 Constipation, unspecified: Secondary | ICD-10-CM | POA: Diagnosis not present

## 2020-03-24 DIAGNOSIS — G8929 Other chronic pain: Secondary | ICD-10-CM | POA: Diagnosis not present

## 2020-03-24 DIAGNOSIS — M545 Low back pain: Secondary | ICD-10-CM | POA: Diagnosis not present

## 2020-03-24 DIAGNOSIS — E785 Hyperlipidemia, unspecified: Secondary | ICD-10-CM | POA: Diagnosis not present

## 2020-03-24 DIAGNOSIS — Z9981 Dependence on supplemental oxygen: Secondary | ICD-10-CM | POA: Diagnosis not present

## 2020-03-24 DIAGNOSIS — R1310 Dysphagia, unspecified: Secondary | ICD-10-CM | POA: Diagnosis not present

## 2020-03-24 DIAGNOSIS — E222 Syndrome of inappropriate secretion of antidiuretic hormone: Secondary | ICD-10-CM | POA: Diagnosis not present

## 2020-03-24 DIAGNOSIS — I5032 Chronic diastolic (congestive) heart failure: Secondary | ICD-10-CM | POA: Diagnosis not present

## 2020-03-24 DIAGNOSIS — L89316 Pressure-induced deep tissue damage of right buttock: Secondary | ICD-10-CM | POA: Diagnosis not present

## 2020-03-24 DIAGNOSIS — L89896 Pressure-induced deep tissue damage of other site: Secondary | ICD-10-CM | POA: Diagnosis not present

## 2020-03-24 DIAGNOSIS — I69391 Dysphagia following cerebral infarction: Secondary | ICD-10-CM | POA: Diagnosis not present

## 2020-03-24 DIAGNOSIS — Z7982 Long term (current) use of aspirin: Secondary | ICD-10-CM | POA: Diagnosis not present

## 2020-03-24 DIAGNOSIS — D649 Anemia, unspecified: Secondary | ICD-10-CM | POA: Diagnosis not present

## 2020-03-24 DIAGNOSIS — I252 Old myocardial infarction: Secondary | ICD-10-CM | POA: Diagnosis not present

## 2020-03-24 DIAGNOSIS — I272 Pulmonary hypertension, unspecified: Secondary | ICD-10-CM | POA: Diagnosis not present

## 2020-03-24 DIAGNOSIS — K760 Fatty (change of) liver, not elsewhere classified: Secondary | ICD-10-CM | POA: Diagnosis not present

## 2020-03-24 DIAGNOSIS — K219 Gastro-esophageal reflux disease without esophagitis: Secondary | ICD-10-CM | POA: Diagnosis not present

## 2020-03-24 DIAGNOSIS — E039 Hypothyroidism, unspecified: Secondary | ICD-10-CM | POA: Diagnosis not present

## 2020-03-24 DIAGNOSIS — I700 Atherosclerosis of aorta: Secondary | ICD-10-CM | POA: Diagnosis not present

## 2020-03-24 DIAGNOSIS — I110 Hypertensive heart disease with heart failure: Secondary | ICD-10-CM | POA: Diagnosis not present

## 2020-03-24 DIAGNOSIS — J9611 Chronic respiratory failure with hypoxia: Secondary | ICD-10-CM | POA: Diagnosis not present

## 2020-03-24 DIAGNOSIS — M7502 Adhesive capsulitis of left shoulder: Secondary | ICD-10-CM | POA: Diagnosis not present

## 2020-03-24 NOTE — Telephone Encounter (Signed)
pmp reviewd, appropriate meds refilled 

## 2020-03-25 DIAGNOSIS — I700 Atherosclerosis of aorta: Secondary | ICD-10-CM | POA: Diagnosis not present

## 2020-03-25 DIAGNOSIS — K5900 Constipation, unspecified: Secondary | ICD-10-CM | POA: Diagnosis not present

## 2020-03-25 DIAGNOSIS — M7502 Adhesive capsulitis of left shoulder: Secondary | ICD-10-CM | POA: Diagnosis not present

## 2020-03-25 DIAGNOSIS — L89316 Pressure-induced deep tissue damage of right buttock: Secondary | ICD-10-CM | POA: Diagnosis not present

## 2020-03-25 DIAGNOSIS — D649 Anemia, unspecified: Secondary | ICD-10-CM | POA: Diagnosis not present

## 2020-03-25 DIAGNOSIS — E222 Syndrome of inappropriate secretion of antidiuretic hormone: Secondary | ICD-10-CM | POA: Diagnosis not present

## 2020-03-25 DIAGNOSIS — I110 Hypertensive heart disease with heart failure: Secondary | ICD-10-CM | POA: Diagnosis not present

## 2020-03-25 DIAGNOSIS — Z9981 Dependence on supplemental oxygen: Secondary | ICD-10-CM | POA: Diagnosis not present

## 2020-03-25 DIAGNOSIS — I69391 Dysphagia following cerebral infarction: Secondary | ICD-10-CM | POA: Diagnosis not present

## 2020-03-25 DIAGNOSIS — E039 Hypothyroidism, unspecified: Secondary | ICD-10-CM | POA: Diagnosis not present

## 2020-03-25 DIAGNOSIS — K760 Fatty (change of) liver, not elsewhere classified: Secondary | ICD-10-CM | POA: Diagnosis not present

## 2020-03-25 DIAGNOSIS — I252 Old myocardial infarction: Secondary | ICD-10-CM | POA: Diagnosis not present

## 2020-03-25 DIAGNOSIS — G8929 Other chronic pain: Secondary | ICD-10-CM | POA: Diagnosis not present

## 2020-03-25 DIAGNOSIS — E785 Hyperlipidemia, unspecified: Secondary | ICD-10-CM | POA: Diagnosis not present

## 2020-03-25 DIAGNOSIS — J9611 Chronic respiratory failure with hypoxia: Secondary | ICD-10-CM | POA: Diagnosis not present

## 2020-03-25 DIAGNOSIS — L89896 Pressure-induced deep tissue damage of other site: Secondary | ICD-10-CM | POA: Diagnosis not present

## 2020-03-25 DIAGNOSIS — R1310 Dysphagia, unspecified: Secondary | ICD-10-CM | POA: Diagnosis not present

## 2020-03-25 DIAGNOSIS — Z7982 Long term (current) use of aspirin: Secondary | ICD-10-CM | POA: Diagnosis not present

## 2020-03-25 DIAGNOSIS — M545 Low back pain: Secondary | ICD-10-CM | POA: Diagnosis not present

## 2020-03-25 DIAGNOSIS — I5032 Chronic diastolic (congestive) heart failure: Secondary | ICD-10-CM | POA: Diagnosis not present

## 2020-03-25 DIAGNOSIS — K219 Gastro-esophageal reflux disease without esophagitis: Secondary | ICD-10-CM | POA: Diagnosis not present

## 2020-03-25 DIAGNOSIS — I272 Pulmonary hypertension, unspecified: Secondary | ICD-10-CM | POA: Diagnosis not present

## 2020-03-28 ENCOUNTER — Other Ambulatory Visit: Payer: Self-pay | Admitting: Family Medicine

## 2020-03-28 ENCOUNTER — Encounter: Payer: Self-pay | Admitting: Family Medicine

## 2020-03-28 DIAGNOSIS — J9611 Chronic respiratory failure with hypoxia: Secondary | ICD-10-CM | POA: Diagnosis not present

## 2020-03-28 DIAGNOSIS — M7502 Adhesive capsulitis of left shoulder: Secondary | ICD-10-CM | POA: Diagnosis not present

## 2020-03-28 DIAGNOSIS — R1310 Dysphagia, unspecified: Secondary | ICD-10-CM | POA: Diagnosis not present

## 2020-03-28 DIAGNOSIS — I110 Hypertensive heart disease with heart failure: Secondary | ICD-10-CM | POA: Diagnosis not present

## 2020-03-28 DIAGNOSIS — Z7982 Long term (current) use of aspirin: Secondary | ICD-10-CM | POA: Diagnosis not present

## 2020-03-28 DIAGNOSIS — M545 Low back pain: Secondary | ICD-10-CM | POA: Diagnosis not present

## 2020-03-28 DIAGNOSIS — K5900 Constipation, unspecified: Secondary | ICD-10-CM | POA: Diagnosis not present

## 2020-03-28 DIAGNOSIS — I272 Pulmonary hypertension, unspecified: Secondary | ICD-10-CM | POA: Diagnosis not present

## 2020-03-28 DIAGNOSIS — E785 Hyperlipidemia, unspecified: Secondary | ICD-10-CM | POA: Diagnosis not present

## 2020-03-28 DIAGNOSIS — G8929 Other chronic pain: Secondary | ICD-10-CM | POA: Diagnosis not present

## 2020-03-28 DIAGNOSIS — E222 Syndrome of inappropriate secretion of antidiuretic hormone: Secondary | ICD-10-CM | POA: Diagnosis not present

## 2020-03-28 DIAGNOSIS — K219 Gastro-esophageal reflux disease without esophagitis: Secondary | ICD-10-CM | POA: Diagnosis not present

## 2020-03-28 DIAGNOSIS — I252 Old myocardial infarction: Secondary | ICD-10-CM | POA: Diagnosis not present

## 2020-03-28 DIAGNOSIS — K760 Fatty (change of) liver, not elsewhere classified: Secondary | ICD-10-CM | POA: Diagnosis not present

## 2020-03-28 DIAGNOSIS — I700 Atherosclerosis of aorta: Secondary | ICD-10-CM | POA: Diagnosis not present

## 2020-03-28 DIAGNOSIS — D649 Anemia, unspecified: Secondary | ICD-10-CM | POA: Diagnosis not present

## 2020-03-28 DIAGNOSIS — L89896 Pressure-induced deep tissue damage of other site: Secondary | ICD-10-CM | POA: Diagnosis not present

## 2020-03-28 DIAGNOSIS — I5032 Chronic diastolic (congestive) heart failure: Secondary | ICD-10-CM | POA: Diagnosis not present

## 2020-03-28 DIAGNOSIS — E039 Hypothyroidism, unspecified: Secondary | ICD-10-CM | POA: Diagnosis not present

## 2020-03-28 DIAGNOSIS — Z9981 Dependence on supplemental oxygen: Secondary | ICD-10-CM | POA: Diagnosis not present

## 2020-03-28 DIAGNOSIS — I69391 Dysphagia following cerebral infarction: Secondary | ICD-10-CM | POA: Diagnosis not present

## 2020-03-28 DIAGNOSIS — L89316 Pressure-induced deep tissue damage of right buttock: Secondary | ICD-10-CM | POA: Diagnosis not present

## 2020-03-28 NOTE — Telephone Encounter (Signed)
Tammy Fernandez pt, you saw them last on 02/24/2020, pt requesting a refill of ipratropium 0.03%nasal spray. Is this okay to fill? Could not find the last fill date

## 2020-03-28 NOTE — Telephone Encounter (Unsigned)
Copied from CRM [id number]. Topic: General - Other >> Mar 23, 2020  1:26 PM Dalphine Handing A wrote: Patients son would like a callback in regards to patient taking hydrALAZINE (APRESOLINE) 10 MG tablet.

## 2020-03-29 DIAGNOSIS — Z9981 Dependence on supplemental oxygen: Secondary | ICD-10-CM | POA: Diagnosis not present

## 2020-03-29 DIAGNOSIS — E222 Syndrome of inappropriate secretion of antidiuretic hormone: Secondary | ICD-10-CM | POA: Diagnosis not present

## 2020-03-29 DIAGNOSIS — K760 Fatty (change of) liver, not elsewhere classified: Secondary | ICD-10-CM | POA: Diagnosis not present

## 2020-03-29 DIAGNOSIS — M545 Low back pain: Secondary | ICD-10-CM | POA: Diagnosis not present

## 2020-03-29 DIAGNOSIS — I252 Old myocardial infarction: Secondary | ICD-10-CM | POA: Diagnosis not present

## 2020-03-29 DIAGNOSIS — I69391 Dysphagia following cerebral infarction: Secondary | ICD-10-CM | POA: Diagnosis not present

## 2020-03-29 DIAGNOSIS — R1310 Dysphagia, unspecified: Secondary | ICD-10-CM | POA: Diagnosis not present

## 2020-03-29 DIAGNOSIS — I110 Hypertensive heart disease with heart failure: Secondary | ICD-10-CM | POA: Diagnosis not present

## 2020-03-29 DIAGNOSIS — Z7982 Long term (current) use of aspirin: Secondary | ICD-10-CM | POA: Diagnosis not present

## 2020-03-29 DIAGNOSIS — K5900 Constipation, unspecified: Secondary | ICD-10-CM | POA: Diagnosis not present

## 2020-03-29 DIAGNOSIS — L89316 Pressure-induced deep tissue damage of right buttock: Secondary | ICD-10-CM | POA: Diagnosis not present

## 2020-03-29 DIAGNOSIS — E039 Hypothyroidism, unspecified: Secondary | ICD-10-CM | POA: Diagnosis not present

## 2020-03-29 DIAGNOSIS — K219 Gastro-esophageal reflux disease without esophagitis: Secondary | ICD-10-CM | POA: Diagnosis not present

## 2020-03-29 DIAGNOSIS — M7502 Adhesive capsulitis of left shoulder: Secondary | ICD-10-CM | POA: Diagnosis not present

## 2020-03-29 DIAGNOSIS — G8929 Other chronic pain: Secondary | ICD-10-CM | POA: Diagnosis not present

## 2020-03-29 DIAGNOSIS — L89896 Pressure-induced deep tissue damage of other site: Secondary | ICD-10-CM | POA: Diagnosis not present

## 2020-03-29 DIAGNOSIS — I5032 Chronic diastolic (congestive) heart failure: Secondary | ICD-10-CM | POA: Diagnosis not present

## 2020-03-29 DIAGNOSIS — E785 Hyperlipidemia, unspecified: Secondary | ICD-10-CM | POA: Diagnosis not present

## 2020-03-29 DIAGNOSIS — D649 Anemia, unspecified: Secondary | ICD-10-CM | POA: Diagnosis not present

## 2020-03-29 DIAGNOSIS — J9611 Chronic respiratory failure with hypoxia: Secondary | ICD-10-CM | POA: Diagnosis not present

## 2020-03-29 DIAGNOSIS — I272 Pulmonary hypertension, unspecified: Secondary | ICD-10-CM | POA: Diagnosis not present

## 2020-03-29 DIAGNOSIS — I700 Atherosclerosis of aorta: Secondary | ICD-10-CM | POA: Diagnosis not present

## 2020-03-29 NOTE — Telephone Encounter (Signed)
Patients son returning [place] call  . Please call patients son back .

## 2020-03-29 NOTE — Telephone Encounter (Signed)
Attempted to call the patients son I LVM for him to return the call.

## 2020-03-29 NOTE — Telephone Encounter (Signed)
Patient Son is calling having some concerns about BP medication because the patient BP has been low every morning 114/45 sometimes lower. Also his mom has been having some swelling of the feet pictures are attached. I tried to get them in with a NP but he wanted your opinion. Please Advise.

## 2020-03-30 DIAGNOSIS — I5032 Chronic diastolic (congestive) heart failure: Secondary | ICD-10-CM | POA: Diagnosis not present

## 2020-03-30 DIAGNOSIS — I10 Essential (primary) hypertension: Secondary | ICD-10-CM | POA: Diagnosis not present

## 2020-03-31 DIAGNOSIS — I5032 Chronic diastolic (congestive) heart failure: Secondary | ICD-10-CM | POA: Diagnosis not present

## 2020-03-31 DIAGNOSIS — I700 Atherosclerosis of aorta: Secondary | ICD-10-CM | POA: Diagnosis not present

## 2020-03-31 DIAGNOSIS — K760 Fatty (change of) liver, not elsewhere classified: Secondary | ICD-10-CM | POA: Diagnosis not present

## 2020-03-31 DIAGNOSIS — L89316 Pressure-induced deep tissue damage of right buttock: Secondary | ICD-10-CM | POA: Diagnosis not present

## 2020-03-31 DIAGNOSIS — D649 Anemia, unspecified: Secondary | ICD-10-CM | POA: Diagnosis not present

## 2020-03-31 DIAGNOSIS — K219 Gastro-esophageal reflux disease without esophagitis: Secondary | ICD-10-CM | POA: Diagnosis not present

## 2020-03-31 DIAGNOSIS — Z9981 Dependence on supplemental oxygen: Secondary | ICD-10-CM | POA: Diagnosis not present

## 2020-03-31 DIAGNOSIS — E222 Syndrome of inappropriate secretion of antidiuretic hormone: Secondary | ICD-10-CM | POA: Diagnosis not present

## 2020-03-31 DIAGNOSIS — E039 Hypothyroidism, unspecified: Secondary | ICD-10-CM | POA: Diagnosis not present

## 2020-03-31 DIAGNOSIS — M7502 Adhesive capsulitis of left shoulder: Secondary | ICD-10-CM | POA: Diagnosis not present

## 2020-03-31 DIAGNOSIS — R1310 Dysphagia, unspecified: Secondary | ICD-10-CM | POA: Diagnosis not present

## 2020-03-31 DIAGNOSIS — I272 Pulmonary hypertension, unspecified: Secondary | ICD-10-CM | POA: Diagnosis not present

## 2020-03-31 DIAGNOSIS — E785 Hyperlipidemia, unspecified: Secondary | ICD-10-CM | POA: Diagnosis not present

## 2020-03-31 DIAGNOSIS — G8929 Other chronic pain: Secondary | ICD-10-CM | POA: Diagnosis not present

## 2020-03-31 DIAGNOSIS — I252 Old myocardial infarction: Secondary | ICD-10-CM | POA: Diagnosis not present

## 2020-03-31 DIAGNOSIS — Z7982 Long term (current) use of aspirin: Secondary | ICD-10-CM | POA: Diagnosis not present

## 2020-03-31 DIAGNOSIS — J9611 Chronic respiratory failure with hypoxia: Secondary | ICD-10-CM | POA: Diagnosis not present

## 2020-03-31 DIAGNOSIS — I69391 Dysphagia following cerebral infarction: Secondary | ICD-10-CM | POA: Diagnosis not present

## 2020-03-31 DIAGNOSIS — K5900 Constipation, unspecified: Secondary | ICD-10-CM | POA: Diagnosis not present

## 2020-03-31 DIAGNOSIS — M545 Low back pain: Secondary | ICD-10-CM | POA: Diagnosis not present

## 2020-03-31 DIAGNOSIS — I110 Hypertensive heart disease with heart failure: Secondary | ICD-10-CM | POA: Diagnosis not present

## 2020-03-31 DIAGNOSIS — L89896 Pressure-induced deep tissue damage of other site: Secondary | ICD-10-CM | POA: Diagnosis not present

## 2020-04-01 ENCOUNTER — Other Ambulatory Visit: Payer: Self-pay | Admitting: Family Medicine

## 2020-04-04 DIAGNOSIS — I110 Hypertensive heart disease with heart failure: Secondary | ICD-10-CM | POA: Diagnosis not present

## 2020-04-04 DIAGNOSIS — K5900 Constipation, unspecified: Secondary | ICD-10-CM | POA: Diagnosis not present

## 2020-04-04 DIAGNOSIS — E222 Syndrome of inappropriate secretion of antidiuretic hormone: Secondary | ICD-10-CM | POA: Diagnosis not present

## 2020-04-04 DIAGNOSIS — K219 Gastro-esophageal reflux disease without esophagitis: Secondary | ICD-10-CM | POA: Diagnosis not present

## 2020-04-04 DIAGNOSIS — I700 Atherosclerosis of aorta: Secondary | ICD-10-CM | POA: Diagnosis not present

## 2020-04-04 DIAGNOSIS — J9611 Chronic respiratory failure with hypoxia: Secondary | ICD-10-CM | POA: Diagnosis not present

## 2020-04-04 DIAGNOSIS — E039 Hypothyroidism, unspecified: Secondary | ICD-10-CM | POA: Diagnosis not present

## 2020-04-04 DIAGNOSIS — Z9981 Dependence on supplemental oxygen: Secondary | ICD-10-CM | POA: Diagnosis not present

## 2020-04-04 DIAGNOSIS — M545 Low back pain: Secondary | ICD-10-CM | POA: Diagnosis not present

## 2020-04-04 DIAGNOSIS — R1310 Dysphagia, unspecified: Secondary | ICD-10-CM | POA: Diagnosis not present

## 2020-04-04 DIAGNOSIS — I5032 Chronic diastolic (congestive) heart failure: Secondary | ICD-10-CM | POA: Diagnosis not present

## 2020-04-04 DIAGNOSIS — G8929 Other chronic pain: Secondary | ICD-10-CM | POA: Diagnosis not present

## 2020-04-04 DIAGNOSIS — L89896 Pressure-induced deep tissue damage of other site: Secondary | ICD-10-CM | POA: Diagnosis not present

## 2020-04-04 DIAGNOSIS — I69391 Dysphagia following cerebral infarction: Secondary | ICD-10-CM | POA: Diagnosis not present

## 2020-04-04 DIAGNOSIS — I272 Pulmonary hypertension, unspecified: Secondary | ICD-10-CM | POA: Diagnosis not present

## 2020-04-04 DIAGNOSIS — K760 Fatty (change of) liver, not elsewhere classified: Secondary | ICD-10-CM | POA: Diagnosis not present

## 2020-04-04 DIAGNOSIS — M7502 Adhesive capsulitis of left shoulder: Secondary | ICD-10-CM | POA: Diagnosis not present

## 2020-04-04 DIAGNOSIS — I252 Old myocardial infarction: Secondary | ICD-10-CM | POA: Diagnosis not present

## 2020-04-04 DIAGNOSIS — L89316 Pressure-induced deep tissue damage of right buttock: Secondary | ICD-10-CM | POA: Diagnosis not present

## 2020-04-04 DIAGNOSIS — E785 Hyperlipidemia, unspecified: Secondary | ICD-10-CM | POA: Diagnosis not present

## 2020-04-04 DIAGNOSIS — D649 Anemia, unspecified: Secondary | ICD-10-CM | POA: Diagnosis not present

## 2020-04-04 DIAGNOSIS — Z7982 Long term (current) use of aspirin: Secondary | ICD-10-CM | POA: Diagnosis not present

## 2020-04-05 ENCOUNTER — Other Ambulatory Visit: Payer: Self-pay | Admitting: Family Medicine

## 2020-04-05 DIAGNOSIS — Z9981 Dependence on supplemental oxygen: Secondary | ICD-10-CM | POA: Diagnosis not present

## 2020-04-05 DIAGNOSIS — I110 Hypertensive heart disease with heart failure: Secondary | ICD-10-CM | POA: Diagnosis not present

## 2020-04-05 DIAGNOSIS — Z7982 Long term (current) use of aspirin: Secondary | ICD-10-CM | POA: Diagnosis not present

## 2020-04-05 DIAGNOSIS — K5900 Constipation, unspecified: Secondary | ICD-10-CM | POA: Diagnosis not present

## 2020-04-05 DIAGNOSIS — L89316 Pressure-induced deep tissue damage of right buttock: Secondary | ICD-10-CM | POA: Diagnosis not present

## 2020-04-05 DIAGNOSIS — I700 Atherosclerosis of aorta: Secondary | ICD-10-CM | POA: Diagnosis not present

## 2020-04-05 DIAGNOSIS — L89896 Pressure-induced deep tissue damage of other site: Secondary | ICD-10-CM | POA: Diagnosis not present

## 2020-04-05 DIAGNOSIS — M545 Low back pain: Secondary | ICD-10-CM | POA: Diagnosis not present

## 2020-04-05 DIAGNOSIS — K760 Fatty (change of) liver, not elsewhere classified: Secondary | ICD-10-CM | POA: Diagnosis not present

## 2020-04-05 DIAGNOSIS — I5032 Chronic diastolic (congestive) heart failure: Secondary | ICD-10-CM | POA: Diagnosis not present

## 2020-04-05 DIAGNOSIS — M7502 Adhesive capsulitis of left shoulder: Secondary | ICD-10-CM | POA: Diagnosis not present

## 2020-04-05 DIAGNOSIS — I69391 Dysphagia following cerebral infarction: Secondary | ICD-10-CM | POA: Diagnosis not present

## 2020-04-05 DIAGNOSIS — I272 Pulmonary hypertension, unspecified: Secondary | ICD-10-CM | POA: Diagnosis not present

## 2020-04-05 DIAGNOSIS — I252 Old myocardial infarction: Secondary | ICD-10-CM | POA: Diagnosis not present

## 2020-04-05 DIAGNOSIS — E222 Syndrome of inappropriate secretion of antidiuretic hormone: Secondary | ICD-10-CM | POA: Diagnosis not present

## 2020-04-05 DIAGNOSIS — R1310 Dysphagia, unspecified: Secondary | ICD-10-CM | POA: Diagnosis not present

## 2020-04-05 DIAGNOSIS — D649 Anemia, unspecified: Secondary | ICD-10-CM | POA: Diagnosis not present

## 2020-04-05 DIAGNOSIS — G8929 Other chronic pain: Secondary | ICD-10-CM | POA: Diagnosis not present

## 2020-04-05 DIAGNOSIS — E785 Hyperlipidemia, unspecified: Secondary | ICD-10-CM | POA: Diagnosis not present

## 2020-04-05 DIAGNOSIS — K219 Gastro-esophageal reflux disease without esophagitis: Secondary | ICD-10-CM | POA: Diagnosis not present

## 2020-04-05 DIAGNOSIS — E039 Hypothyroidism, unspecified: Secondary | ICD-10-CM | POA: Diagnosis not present

## 2020-04-05 DIAGNOSIS — J9611 Chronic respiratory failure with hypoxia: Secondary | ICD-10-CM | POA: Diagnosis not present

## 2020-04-06 DIAGNOSIS — Z9981 Dependence on supplemental oxygen: Secondary | ICD-10-CM | POA: Diagnosis not present

## 2020-04-06 DIAGNOSIS — M545 Low back pain: Secondary | ICD-10-CM | POA: Diagnosis not present

## 2020-04-06 DIAGNOSIS — I272 Pulmonary hypertension, unspecified: Secondary | ICD-10-CM | POA: Diagnosis not present

## 2020-04-06 DIAGNOSIS — I69391 Dysphagia following cerebral infarction: Secondary | ICD-10-CM | POA: Diagnosis not present

## 2020-04-06 DIAGNOSIS — Z7982 Long term (current) use of aspirin: Secondary | ICD-10-CM | POA: Diagnosis not present

## 2020-04-06 DIAGNOSIS — L89316 Pressure-induced deep tissue damage of right buttock: Secondary | ICD-10-CM | POA: Diagnosis not present

## 2020-04-06 DIAGNOSIS — I700 Atherosclerosis of aorta: Secondary | ICD-10-CM | POA: Diagnosis not present

## 2020-04-06 DIAGNOSIS — I252 Old myocardial infarction: Secondary | ICD-10-CM | POA: Diagnosis not present

## 2020-04-06 DIAGNOSIS — K760 Fatty (change of) liver, not elsewhere classified: Secondary | ICD-10-CM | POA: Diagnosis not present

## 2020-04-06 DIAGNOSIS — E222 Syndrome of inappropriate secretion of antidiuretic hormone: Secondary | ICD-10-CM | POA: Diagnosis not present

## 2020-04-06 DIAGNOSIS — E785 Hyperlipidemia, unspecified: Secondary | ICD-10-CM | POA: Diagnosis not present

## 2020-04-06 DIAGNOSIS — L89896 Pressure-induced deep tissue damage of other site: Secondary | ICD-10-CM | POA: Diagnosis not present

## 2020-04-06 DIAGNOSIS — D649 Anemia, unspecified: Secondary | ICD-10-CM | POA: Diagnosis not present

## 2020-04-06 DIAGNOSIS — E039 Hypothyroidism, unspecified: Secondary | ICD-10-CM | POA: Diagnosis not present

## 2020-04-06 DIAGNOSIS — K219 Gastro-esophageal reflux disease without esophagitis: Secondary | ICD-10-CM | POA: Diagnosis not present

## 2020-04-06 DIAGNOSIS — M7502 Adhesive capsulitis of left shoulder: Secondary | ICD-10-CM | POA: Diagnosis not present

## 2020-04-06 DIAGNOSIS — I5032 Chronic diastolic (congestive) heart failure: Secondary | ICD-10-CM | POA: Diagnosis not present

## 2020-04-06 DIAGNOSIS — I110 Hypertensive heart disease with heart failure: Secondary | ICD-10-CM | POA: Diagnosis not present

## 2020-04-06 DIAGNOSIS — G8929 Other chronic pain: Secondary | ICD-10-CM | POA: Diagnosis not present

## 2020-04-06 DIAGNOSIS — J9611 Chronic respiratory failure with hypoxia: Secondary | ICD-10-CM | POA: Diagnosis not present

## 2020-04-06 DIAGNOSIS — R1310 Dysphagia, unspecified: Secondary | ICD-10-CM | POA: Diagnosis not present

## 2020-04-06 DIAGNOSIS — K5900 Constipation, unspecified: Secondary | ICD-10-CM | POA: Diagnosis not present

## 2020-04-06 DIAGNOSIS — I10 Essential (primary) hypertension: Secondary | ICD-10-CM | POA: Diagnosis not present

## 2020-04-07 DIAGNOSIS — M7502 Adhesive capsulitis of left shoulder: Secondary | ICD-10-CM | POA: Diagnosis not present

## 2020-04-07 DIAGNOSIS — E222 Syndrome of inappropriate secretion of antidiuretic hormone: Secondary | ICD-10-CM | POA: Diagnosis not present

## 2020-04-07 DIAGNOSIS — K760 Fatty (change of) liver, not elsewhere classified: Secondary | ICD-10-CM | POA: Diagnosis not present

## 2020-04-07 DIAGNOSIS — M545 Low back pain: Secondary | ICD-10-CM | POA: Diagnosis not present

## 2020-04-07 DIAGNOSIS — K219 Gastro-esophageal reflux disease without esophagitis: Secondary | ICD-10-CM | POA: Diagnosis not present

## 2020-04-07 DIAGNOSIS — I252 Old myocardial infarction: Secondary | ICD-10-CM | POA: Diagnosis not present

## 2020-04-07 DIAGNOSIS — K5900 Constipation, unspecified: Secondary | ICD-10-CM | POA: Diagnosis not present

## 2020-04-07 DIAGNOSIS — D649 Anemia, unspecified: Secondary | ICD-10-CM | POA: Diagnosis not present

## 2020-04-07 DIAGNOSIS — I700 Atherosclerosis of aorta: Secondary | ICD-10-CM | POA: Diagnosis not present

## 2020-04-07 DIAGNOSIS — I5032 Chronic diastolic (congestive) heart failure: Secondary | ICD-10-CM | POA: Diagnosis not present

## 2020-04-07 DIAGNOSIS — Z9981 Dependence on supplemental oxygen: Secondary | ICD-10-CM | POA: Diagnosis not present

## 2020-04-07 DIAGNOSIS — L89896 Pressure-induced deep tissue damage of other site: Secondary | ICD-10-CM | POA: Diagnosis not present

## 2020-04-07 DIAGNOSIS — Z7982 Long term (current) use of aspirin: Secondary | ICD-10-CM | POA: Diagnosis not present

## 2020-04-07 DIAGNOSIS — R1310 Dysphagia, unspecified: Secondary | ICD-10-CM | POA: Diagnosis not present

## 2020-04-07 DIAGNOSIS — E785 Hyperlipidemia, unspecified: Secondary | ICD-10-CM | POA: Diagnosis not present

## 2020-04-07 DIAGNOSIS — J9611 Chronic respiratory failure with hypoxia: Secondary | ICD-10-CM | POA: Diagnosis not present

## 2020-04-07 DIAGNOSIS — L89316 Pressure-induced deep tissue damage of right buttock: Secondary | ICD-10-CM | POA: Diagnosis not present

## 2020-04-07 DIAGNOSIS — I69391 Dysphagia following cerebral infarction: Secondary | ICD-10-CM | POA: Diagnosis not present

## 2020-04-07 DIAGNOSIS — I272 Pulmonary hypertension, unspecified: Secondary | ICD-10-CM | POA: Diagnosis not present

## 2020-04-07 DIAGNOSIS — G8929 Other chronic pain: Secondary | ICD-10-CM | POA: Diagnosis not present

## 2020-04-07 DIAGNOSIS — I110 Hypertensive heart disease with heart failure: Secondary | ICD-10-CM | POA: Diagnosis not present

## 2020-04-07 DIAGNOSIS — E039 Hypothyroidism, unspecified: Secondary | ICD-10-CM | POA: Diagnosis not present

## 2020-04-08 ENCOUNTER — Other Ambulatory Visit: Payer: Self-pay | Admitting: Family Medicine

## 2020-04-11 ENCOUNTER — Telehealth: Payer: Self-pay

## 2020-04-11 DIAGNOSIS — K5900 Constipation, unspecified: Secondary | ICD-10-CM | POA: Diagnosis not present

## 2020-04-11 DIAGNOSIS — D649 Anemia, unspecified: Secondary | ICD-10-CM | POA: Diagnosis not present

## 2020-04-11 DIAGNOSIS — Z9981 Dependence on supplemental oxygen: Secondary | ICD-10-CM | POA: Diagnosis not present

## 2020-04-11 DIAGNOSIS — E039 Hypothyroidism, unspecified: Secondary | ICD-10-CM | POA: Diagnosis not present

## 2020-04-11 DIAGNOSIS — I700 Atherosclerosis of aorta: Secondary | ICD-10-CM | POA: Diagnosis not present

## 2020-04-11 DIAGNOSIS — I69391 Dysphagia following cerebral infarction: Secondary | ICD-10-CM | POA: Diagnosis not present

## 2020-04-11 DIAGNOSIS — M545 Low back pain: Secondary | ICD-10-CM | POA: Diagnosis not present

## 2020-04-11 DIAGNOSIS — K219 Gastro-esophageal reflux disease without esophagitis: Secondary | ICD-10-CM

## 2020-04-11 DIAGNOSIS — L89316 Pressure-induced deep tissue damage of right buttock: Secondary | ICD-10-CM | POA: Diagnosis not present

## 2020-04-11 DIAGNOSIS — L89896 Pressure-induced deep tissue damage of other site: Secondary | ICD-10-CM | POA: Diagnosis not present

## 2020-04-11 DIAGNOSIS — R1310 Dysphagia, unspecified: Secondary | ICD-10-CM | POA: Diagnosis not present

## 2020-04-11 DIAGNOSIS — K760 Fatty (change of) liver, not elsewhere classified: Secondary | ICD-10-CM | POA: Diagnosis not present

## 2020-04-11 DIAGNOSIS — I272 Pulmonary hypertension, unspecified: Secondary | ICD-10-CM | POA: Diagnosis not present

## 2020-04-11 DIAGNOSIS — E222 Syndrome of inappropriate secretion of antidiuretic hormone: Secondary | ICD-10-CM | POA: Diagnosis not present

## 2020-04-11 DIAGNOSIS — E785 Hyperlipidemia, unspecified: Secondary | ICD-10-CM | POA: Diagnosis not present

## 2020-04-11 DIAGNOSIS — M7502 Adhesive capsulitis of left shoulder: Secondary | ICD-10-CM | POA: Diagnosis not present

## 2020-04-11 DIAGNOSIS — I5032 Chronic diastolic (congestive) heart failure: Secondary | ICD-10-CM | POA: Diagnosis not present

## 2020-04-11 DIAGNOSIS — J9611 Chronic respiratory failure with hypoxia: Secondary | ICD-10-CM | POA: Diagnosis not present

## 2020-04-11 DIAGNOSIS — I252 Old myocardial infarction: Secondary | ICD-10-CM | POA: Diagnosis not present

## 2020-04-11 DIAGNOSIS — I110 Hypertensive heart disease with heart failure: Secondary | ICD-10-CM | POA: Diagnosis not present

## 2020-04-11 DIAGNOSIS — Z7982 Long term (current) use of aspirin: Secondary | ICD-10-CM | POA: Diagnosis not present

## 2020-04-11 DIAGNOSIS — G8929 Other chronic pain: Secondary | ICD-10-CM | POA: Diagnosis not present

## 2020-04-11 NOTE — Telephone Encounter (Signed)
message sent to pcp to do order for barium swallow

## 2020-04-11 NOTE — Telephone Encounter (Signed)
Tammy Fernandez at [facility] is still requesting for this pt to have another barium swallow being that the one she had was from a yr ago. Asking that you write the order and I will fax it to [facility]

## 2020-04-11 NOTE — Telephone Encounter (Signed)
Please let speech therapist that patient had one done June 2020 - this is the report. thanks FINDINGS: Study was very limited by a lack of patient mobility. With this limitation in mind, single contrast esophagram demonstrated no esophageal mass or esophageal ring. Esophageal motility was abnormal, with failure to propagate any normal primary peristaltic waves, and occasional tertiary contractions.  IMPRESSION: 1. Presbyesophagus with occasional tertiary contractions.

## 2020-04-12 NOTE — Addendum Note (Signed)
Addended by: Myles Lipps on: 04/12/2020 12:51 PM   Modules accepted: Orders

## 2020-04-13 ENCOUNTER — Telehealth: Payer: Self-pay | Admitting: Family Medicine

## 2020-04-13 ENCOUNTER — Other Ambulatory Visit: Payer: Self-pay | Admitting: Family Medicine

## 2020-04-13 DIAGNOSIS — I69391 Dysphagia following cerebral infarction: Secondary | ICD-10-CM | POA: Diagnosis not present

## 2020-04-13 DIAGNOSIS — G8929 Other chronic pain: Secondary | ICD-10-CM | POA: Diagnosis not present

## 2020-04-13 DIAGNOSIS — Z9981 Dependence on supplemental oxygen: Secondary | ICD-10-CM | POA: Diagnosis not present

## 2020-04-13 DIAGNOSIS — I5032 Chronic diastolic (congestive) heart failure: Secondary | ICD-10-CM | POA: Diagnosis not present

## 2020-04-13 DIAGNOSIS — D649 Anemia, unspecified: Secondary | ICD-10-CM | POA: Diagnosis not present

## 2020-04-13 DIAGNOSIS — M7502 Adhesive capsulitis of left shoulder: Secondary | ICD-10-CM | POA: Diagnosis not present

## 2020-04-13 DIAGNOSIS — I272 Pulmonary hypertension, unspecified: Secondary | ICD-10-CM | POA: Diagnosis not present

## 2020-04-13 DIAGNOSIS — I252 Old myocardial infarction: Secondary | ICD-10-CM | POA: Diagnosis not present

## 2020-04-13 DIAGNOSIS — K760 Fatty (change of) liver, not elsewhere classified: Secondary | ICD-10-CM | POA: Diagnosis not present

## 2020-04-13 DIAGNOSIS — L89896 Pressure-induced deep tissue damage of other site: Secondary | ICD-10-CM | POA: Diagnosis not present

## 2020-04-13 DIAGNOSIS — R062 Wheezing: Secondary | ICD-10-CM

## 2020-04-13 DIAGNOSIS — I700 Atherosclerosis of aorta: Secondary | ICD-10-CM | POA: Diagnosis not present

## 2020-04-13 DIAGNOSIS — Z7982 Long term (current) use of aspirin: Secondary | ICD-10-CM | POA: Diagnosis not present

## 2020-04-13 DIAGNOSIS — E039 Hypothyroidism, unspecified: Secondary | ICD-10-CM | POA: Diagnosis not present

## 2020-04-13 DIAGNOSIS — K219 Gastro-esophageal reflux disease without esophagitis: Secondary | ICD-10-CM | POA: Diagnosis not present

## 2020-04-13 DIAGNOSIS — K5900 Constipation, unspecified: Secondary | ICD-10-CM | POA: Diagnosis not present

## 2020-04-13 DIAGNOSIS — R1310 Dysphagia, unspecified: Secondary | ICD-10-CM | POA: Diagnosis not present

## 2020-04-13 DIAGNOSIS — E785 Hyperlipidemia, unspecified: Secondary | ICD-10-CM | POA: Diagnosis not present

## 2020-04-13 DIAGNOSIS — E222 Syndrome of inappropriate secretion of antidiuretic hormone: Secondary | ICD-10-CM | POA: Diagnosis not present

## 2020-04-13 DIAGNOSIS — J9611 Chronic respiratory failure with hypoxia: Secondary | ICD-10-CM | POA: Diagnosis not present

## 2020-04-13 DIAGNOSIS — L89316 Pressure-induced deep tissue damage of right buttock: Secondary | ICD-10-CM | POA: Diagnosis not present

## 2020-04-13 DIAGNOSIS — I110 Hypertensive heart disease with heart failure: Secondary | ICD-10-CM | POA: Diagnosis not present

## 2020-04-13 DIAGNOSIS — M545 Low back pain: Secondary | ICD-10-CM | POA: Diagnosis not present

## 2020-04-13 MED ORDER — NEBULIZER DEVI: 0 refills | Status: DC

## 2020-04-13 NOTE — Telephone Encounter (Signed)
Attempted to call pt no answer  

## 2020-04-13 NOTE — Telephone Encounter (Signed)
Home Health Verbal Orders - Caller/Agency: [facility] Number: [phone] Requesting OT/PT/Skilled Nursing/Social Work/Speech Therapy: Home Health orders have been faxed over. 1 on 03/31/2020 and another on 04/05/2020  Frequency: Requesting fax back  Fax: [phone]

## 2020-04-14 ENCOUNTER — Other Ambulatory Visit: Payer: Self-pay | Admitting: Family Medicine

## 2020-04-14 ENCOUNTER — Telehealth: Payer: Self-pay | Admitting: Family Medicine

## 2020-04-14 DIAGNOSIS — M7502 Adhesive capsulitis of left shoulder: Secondary | ICD-10-CM | POA: Diagnosis not present

## 2020-04-14 DIAGNOSIS — E039 Hypothyroidism, unspecified: Secondary | ICD-10-CM | POA: Diagnosis not present

## 2020-04-14 DIAGNOSIS — K760 Fatty (change of) liver, not elsewhere classified: Secondary | ICD-10-CM | POA: Diagnosis not present

## 2020-04-14 DIAGNOSIS — K219 Gastro-esophageal reflux disease without esophagitis: Secondary | ICD-10-CM | POA: Diagnosis not present

## 2020-04-14 DIAGNOSIS — K5900 Constipation, unspecified: Secondary | ICD-10-CM | POA: Diagnosis not present

## 2020-04-14 DIAGNOSIS — R1310 Dysphagia, unspecified: Secondary | ICD-10-CM | POA: Diagnosis not present

## 2020-04-14 DIAGNOSIS — J9611 Chronic respiratory failure with hypoxia: Secondary | ICD-10-CM | POA: Diagnosis not present

## 2020-04-14 DIAGNOSIS — L89896 Pressure-induced deep tissue damage of other site: Secondary | ICD-10-CM | POA: Diagnosis not present

## 2020-04-14 DIAGNOSIS — I110 Hypertensive heart disease with heart failure: Secondary | ICD-10-CM | POA: Diagnosis not present

## 2020-04-14 DIAGNOSIS — G8929 Other chronic pain: Secondary | ICD-10-CM | POA: Diagnosis not present

## 2020-04-14 DIAGNOSIS — I700 Atherosclerosis of aorta: Secondary | ICD-10-CM | POA: Diagnosis not present

## 2020-04-14 DIAGNOSIS — D649 Anemia, unspecified: Secondary | ICD-10-CM | POA: Diagnosis not present

## 2020-04-14 DIAGNOSIS — Z7982 Long term (current) use of aspirin: Secondary | ICD-10-CM | POA: Diagnosis not present

## 2020-04-14 DIAGNOSIS — E785 Hyperlipidemia, unspecified: Secondary | ICD-10-CM | POA: Diagnosis not present

## 2020-04-14 DIAGNOSIS — I252 Old myocardial infarction: Secondary | ICD-10-CM | POA: Diagnosis not present

## 2020-04-14 DIAGNOSIS — M545 Low back pain: Secondary | ICD-10-CM | POA: Diagnosis not present

## 2020-04-14 DIAGNOSIS — I69391 Dysphagia following cerebral infarction: Secondary | ICD-10-CM | POA: Diagnosis not present

## 2020-04-14 DIAGNOSIS — L89316 Pressure-induced deep tissue damage of right buttock: Secondary | ICD-10-CM | POA: Diagnosis not present

## 2020-04-14 DIAGNOSIS — E222 Syndrome of inappropriate secretion of antidiuretic hormone: Secondary | ICD-10-CM | POA: Diagnosis not present

## 2020-04-14 DIAGNOSIS — I5032 Chronic diastolic (congestive) heart failure: Secondary | ICD-10-CM | POA: Diagnosis not present

## 2020-04-14 DIAGNOSIS — I272 Pulmonary hypertension, unspecified: Secondary | ICD-10-CM | POA: Diagnosis not present

## 2020-04-14 DIAGNOSIS — Z9981 Dependence on supplemental oxygen: Secondary | ICD-10-CM | POA: Diagnosis not present

## 2020-04-14 MED ORDER — ALBUTEROL SULFATE (2.5 MG/3ML) 0.083% IN NEBU: INHALATION_SOLUTION | RESPIRATORY_TRACT | 0 refills | Status: DC

## 2020-04-14 NOTE — Telephone Encounter (Signed)
Returned the call to [place], lm for her to call bk

## 2020-04-14 NOTE — Telephone Encounter (Signed)
Requested Prescriptions  Pending Prescriptions Disp Refills  . hydrALAZINE (APRESOLINE) 10 MG tablet [Pharmacy Med Name: HYDRALAZINE 10 MG TABLETS (ORANGE)] 60 tablet 2    Sig: TAKE 1 TABLET(10 MG) BY MOUTH TWICE DAILY     Cardiovascular:  Vasodilators Failed - 04/14/2020 10:17 AM      Failed - HCT in normal range and within 360 days    Hematocrit  Date Value Ref Range Status  02/09/2020 32.7 (L) 34.0 - 46.6 % Final         Failed - HGB in normal range and within 360 days    Hemoglobin  Date Value Ref Range Status  02/09/2020 10.3 (L) 11.1 - 15.9 g/dL Final         Failed - RBC in normal range and within 360 days    RBC  Date Value Ref Range Status  02/09/2020 3.69 (L) 3.77 - 5.28 x10E6/uL Final  01/07/2019 3.12 (L) 3.87 - 5.11 MIL/uL Final         Failed - Last BP in normal range    BP Readings from Last 1 Encounters:  02/09/20 (!) 120/48         Passed - WBC in normal range and within 360 days    WBC  Date Value Ref Range Status  02/09/2020 8.1 3.4 - 10.8 x10E3/uL Final  01/07/2019 10.5 4.0 - 10.5 K/uL Final         Passed - PLT in normal range and within 360 days    Platelets  Date Value Ref Range Status  02/09/2020 291 150 - 450 x10E3/uL Final         Passed - Valid encounter within last 12 months    Recent Outpatient Visits          1 month ago Hyperkalemia   Primary Care at [facility], Manus Rudd, MD   2 months ago Essential hypertension   [facility] at [facility], Meda Coffee, MD   2 months ago Essential hypertension   [facility] at [facility], Meda Coffee, MD   3 months ago Essential hypertension   [facility] at [facility], Meda Coffee, MD   4 months ago Gastroesophageal reflux disease without esophagitis   Primary Care at [facility], Meda Coffee, MD

## 2020-04-14 NOTE — Telephone Encounter (Addendum)
Judeth Cornfield calling from [facility] / pt is on sodium Chloride 1 gram 2 tablets 2 times daily and it causing edema / may need adjustment also. Her BP meds may need dosage adjustment because  her blood pressure is spiking . She is almost out of her albuterol inhaler, inhaler has been sent to the pt pharmacy. Message will be forwarded to the pcp  Judeth Cornfield can be reached at [phone]

## 2020-04-14 NOTE — Telephone Encounter (Signed)
Judeth Cornfield calling from [facility] / pt is on sodium Chloride 1 gram 2 tablets 2 times dailey and it causing edema / may need adjustment also. Her BP meds may need dosage adjustment because  her blood pressure is spiking . She is almost out of her albuterol inhaler  Please advise Judeth Cornfield can be reached at [phone]

## 2020-04-15 DIAGNOSIS — I700 Atherosclerosis of aorta: Secondary | ICD-10-CM | POA: Diagnosis not present

## 2020-04-15 DIAGNOSIS — L89316 Pressure-induced deep tissue damage of right buttock: Secondary | ICD-10-CM | POA: Diagnosis not present

## 2020-04-15 DIAGNOSIS — I5032 Chronic diastolic (congestive) heart failure: Secondary | ICD-10-CM | POA: Diagnosis not present

## 2020-04-15 DIAGNOSIS — K760 Fatty (change of) liver, not elsewhere classified: Secondary | ICD-10-CM | POA: Diagnosis not present

## 2020-04-15 DIAGNOSIS — K5900 Constipation, unspecified: Secondary | ICD-10-CM | POA: Diagnosis not present

## 2020-04-15 DIAGNOSIS — E785 Hyperlipidemia, unspecified: Secondary | ICD-10-CM | POA: Diagnosis not present

## 2020-04-15 DIAGNOSIS — K219 Gastro-esophageal reflux disease without esophagitis: Secondary | ICD-10-CM | POA: Diagnosis not present

## 2020-04-15 DIAGNOSIS — J9611 Chronic respiratory failure with hypoxia: Secondary | ICD-10-CM | POA: Diagnosis not present

## 2020-04-15 DIAGNOSIS — R1310 Dysphagia, unspecified: Secondary | ICD-10-CM | POA: Diagnosis not present

## 2020-04-15 DIAGNOSIS — I252 Old myocardial infarction: Secondary | ICD-10-CM | POA: Diagnosis not present

## 2020-04-15 DIAGNOSIS — Z7982 Long term (current) use of aspirin: Secondary | ICD-10-CM | POA: Diagnosis not present

## 2020-04-15 DIAGNOSIS — E222 Syndrome of inappropriate secretion of antidiuretic hormone: Secondary | ICD-10-CM | POA: Diagnosis not present

## 2020-04-15 DIAGNOSIS — I69391 Dysphagia following cerebral infarction: Secondary | ICD-10-CM | POA: Diagnosis not present

## 2020-04-15 DIAGNOSIS — M545 Low back pain: Secondary | ICD-10-CM | POA: Diagnosis not present

## 2020-04-15 DIAGNOSIS — D649 Anemia, unspecified: Secondary | ICD-10-CM | POA: Diagnosis not present

## 2020-04-15 DIAGNOSIS — Z9981 Dependence on supplemental oxygen: Secondary | ICD-10-CM | POA: Diagnosis not present

## 2020-04-15 DIAGNOSIS — I272 Pulmonary hypertension, unspecified: Secondary | ICD-10-CM | POA: Diagnosis not present

## 2020-04-15 DIAGNOSIS — I110 Hypertensive heart disease with heart failure: Secondary | ICD-10-CM | POA: Diagnosis not present

## 2020-04-15 DIAGNOSIS — L89896 Pressure-induced deep tissue damage of other site: Secondary | ICD-10-CM | POA: Diagnosis not present

## 2020-04-15 DIAGNOSIS — E039 Hypothyroidism, unspecified: Secondary | ICD-10-CM | POA: Diagnosis not present

## 2020-04-15 DIAGNOSIS — G8929 Other chronic pain: Secondary | ICD-10-CM | POA: Diagnosis not present

## 2020-04-15 DIAGNOSIS — M7502 Adhesive capsulitis of left shoulder: Secondary | ICD-10-CM | POA: Diagnosis not present

## 2020-04-15 NOTE — Telephone Encounter (Signed)
Called pt, no answer.

## 2020-04-17 DIAGNOSIS — I6789 Other cerebrovascular disease: Secondary | ICD-10-CM | POA: Diagnosis not present

## 2020-04-17 DIAGNOSIS — J9601 Acute respiratory failure with hypoxia: Secondary | ICD-10-CM | POA: Diagnosis not present

## 2020-04-17 DIAGNOSIS — R7881 Bacteremia: Secondary | ICD-10-CM | POA: Diagnosis not present

## 2020-04-17 DIAGNOSIS — J441 Chronic obstructive pulmonary disease with (acute) exacerbation: Secondary | ICD-10-CM | POA: Diagnosis not present

## 2020-04-18 ENCOUNTER — Telehealth: Payer: Self-pay

## 2020-04-18 DIAGNOSIS — I5032 Chronic diastolic (congestive) heart failure: Secondary | ICD-10-CM | POA: Diagnosis not present

## 2020-04-18 DIAGNOSIS — I10 Essential (primary) hypertension: Secondary | ICD-10-CM | POA: Diagnosis not present

## 2020-04-18 NOTE — Telephone Encounter (Signed)
Faxed order to [facility] to [phone], Judeth Cornfield can be reached at [phone]

## 2020-04-18 NOTE — Telephone Encounter (Signed)
Spoke with Tammy Fernandez Ordered bmp, last na 121 - instructions were to decrease water intake and recheck.  Discussed concerns for decreasing sodium and use of supportive measures for edema.

## 2020-04-19 ENCOUNTER — Ambulatory Visit: Payer: Medicare Other | Admitting: Adult Health

## 2020-04-20 DIAGNOSIS — E785 Hyperlipidemia, unspecified: Secondary | ICD-10-CM | POA: Diagnosis not present

## 2020-04-20 DIAGNOSIS — I69391 Dysphagia following cerebral infarction: Secondary | ICD-10-CM | POA: Diagnosis not present

## 2020-04-20 DIAGNOSIS — E222 Syndrome of inappropriate secretion of antidiuretic hormone: Secondary | ICD-10-CM | POA: Diagnosis not present

## 2020-04-20 DIAGNOSIS — I252 Old myocardial infarction: Secondary | ICD-10-CM | POA: Diagnosis not present

## 2020-04-20 DIAGNOSIS — G8929 Other chronic pain: Secondary | ICD-10-CM | POA: Diagnosis not present

## 2020-04-20 DIAGNOSIS — L89896 Pressure-induced deep tissue damage of other site: Secondary | ICD-10-CM | POA: Diagnosis not present

## 2020-04-20 DIAGNOSIS — I272 Pulmonary hypertension, unspecified: Secondary | ICD-10-CM | POA: Diagnosis not present

## 2020-04-20 DIAGNOSIS — M7502 Adhesive capsulitis of left shoulder: Secondary | ICD-10-CM | POA: Diagnosis not present

## 2020-04-20 DIAGNOSIS — K760 Fatty (change of) liver, not elsewhere classified: Secondary | ICD-10-CM | POA: Diagnosis not present

## 2020-04-20 DIAGNOSIS — J9611 Chronic respiratory failure with hypoxia: Secondary | ICD-10-CM | POA: Diagnosis not present

## 2020-04-20 DIAGNOSIS — Z7982 Long term (current) use of aspirin: Secondary | ICD-10-CM | POA: Diagnosis not present

## 2020-04-20 DIAGNOSIS — K5900 Constipation, unspecified: Secondary | ICD-10-CM | POA: Diagnosis not present

## 2020-04-20 DIAGNOSIS — K219 Gastro-esophageal reflux disease without esophagitis: Secondary | ICD-10-CM | POA: Diagnosis not present

## 2020-04-20 DIAGNOSIS — R1310 Dysphagia, unspecified: Secondary | ICD-10-CM | POA: Diagnosis not present

## 2020-04-20 DIAGNOSIS — E039 Hypothyroidism, unspecified: Secondary | ICD-10-CM | POA: Diagnosis not present

## 2020-04-20 DIAGNOSIS — L89316 Pressure-induced deep tissue damage of right buttock: Secondary | ICD-10-CM | POA: Diagnosis not present

## 2020-04-20 DIAGNOSIS — M545 Low back pain: Secondary | ICD-10-CM | POA: Diagnosis not present

## 2020-04-20 DIAGNOSIS — I700 Atherosclerosis of aorta: Secondary | ICD-10-CM | POA: Diagnosis not present

## 2020-04-20 DIAGNOSIS — D649 Anemia, unspecified: Secondary | ICD-10-CM | POA: Diagnosis not present

## 2020-04-20 DIAGNOSIS — I5032 Chronic diastolic (congestive) heart failure: Secondary | ICD-10-CM | POA: Diagnosis not present

## 2020-04-20 DIAGNOSIS — Z9981 Dependence on supplemental oxygen: Secondary | ICD-10-CM | POA: Diagnosis not present

## 2020-04-20 DIAGNOSIS — I110 Hypertensive heart disease with heart failure: Secondary | ICD-10-CM | POA: Diagnosis not present

## 2020-04-21 DIAGNOSIS — K219 Gastro-esophageal reflux disease without esophagitis: Secondary | ICD-10-CM | POA: Diagnosis not present

## 2020-04-21 DIAGNOSIS — Z7982 Long term (current) use of aspirin: Secondary | ICD-10-CM | POA: Diagnosis not present

## 2020-04-21 DIAGNOSIS — I110 Hypertensive heart disease with heart failure: Secondary | ICD-10-CM | POA: Diagnosis not present

## 2020-04-21 DIAGNOSIS — E039 Hypothyroidism, unspecified: Secondary | ICD-10-CM | POA: Diagnosis not present

## 2020-04-21 DIAGNOSIS — M7502 Adhesive capsulitis of left shoulder: Secondary | ICD-10-CM | POA: Diagnosis not present

## 2020-04-21 DIAGNOSIS — Z9981 Dependence on supplemental oxygen: Secondary | ICD-10-CM | POA: Diagnosis not present

## 2020-04-21 DIAGNOSIS — E222 Syndrome of inappropriate secretion of antidiuretic hormone: Secondary | ICD-10-CM | POA: Diagnosis not present

## 2020-04-21 DIAGNOSIS — L89316 Pressure-induced deep tissue damage of right buttock: Secondary | ICD-10-CM | POA: Diagnosis not present

## 2020-04-21 DIAGNOSIS — J9611 Chronic respiratory failure with hypoxia: Secondary | ICD-10-CM | POA: Diagnosis not present

## 2020-04-21 DIAGNOSIS — R1310 Dysphagia, unspecified: Secondary | ICD-10-CM | POA: Diagnosis not present

## 2020-04-21 DIAGNOSIS — I272 Pulmonary hypertension, unspecified: Secondary | ICD-10-CM | POA: Diagnosis not present

## 2020-04-21 DIAGNOSIS — M545 Low back pain: Secondary | ICD-10-CM | POA: Diagnosis not present

## 2020-04-21 DIAGNOSIS — I700 Atherosclerosis of aorta: Secondary | ICD-10-CM | POA: Diagnosis not present

## 2020-04-21 DIAGNOSIS — L89896 Pressure-induced deep tissue damage of other site: Secondary | ICD-10-CM | POA: Diagnosis not present

## 2020-04-21 DIAGNOSIS — E785 Hyperlipidemia, unspecified: Secondary | ICD-10-CM | POA: Diagnosis not present

## 2020-04-21 DIAGNOSIS — K5900 Constipation, unspecified: Secondary | ICD-10-CM | POA: Diagnosis not present

## 2020-04-21 DIAGNOSIS — I252 Old myocardial infarction: Secondary | ICD-10-CM | POA: Diagnosis not present

## 2020-04-21 DIAGNOSIS — I69391 Dysphagia following cerebral infarction: Secondary | ICD-10-CM | POA: Diagnosis not present

## 2020-04-21 DIAGNOSIS — I5032 Chronic diastolic (congestive) heart failure: Secondary | ICD-10-CM | POA: Diagnosis not present

## 2020-04-21 DIAGNOSIS — K760 Fatty (change of) liver, not elsewhere classified: Secondary | ICD-10-CM | POA: Diagnosis not present

## 2020-04-21 DIAGNOSIS — D649 Anemia, unspecified: Secondary | ICD-10-CM | POA: Diagnosis not present

## 2020-04-21 DIAGNOSIS — G8929 Other chronic pain: Secondary | ICD-10-CM | POA: Diagnosis not present

## 2020-04-22 DIAGNOSIS — I110 Hypertensive heart disease with heart failure: Secondary | ICD-10-CM | POA: Diagnosis not present

## 2020-04-22 DIAGNOSIS — R1310 Dysphagia, unspecified: Secondary | ICD-10-CM | POA: Diagnosis not present

## 2020-04-22 DIAGNOSIS — I272 Pulmonary hypertension, unspecified: Secondary | ICD-10-CM | POA: Diagnosis not present

## 2020-04-22 DIAGNOSIS — I5032 Chronic diastolic (congestive) heart failure: Secondary | ICD-10-CM | POA: Diagnosis not present

## 2020-04-22 DIAGNOSIS — J9611 Chronic respiratory failure with hypoxia: Secondary | ICD-10-CM | POA: Diagnosis not present

## 2020-04-22 DIAGNOSIS — E039 Hypothyroidism, unspecified: Secondary | ICD-10-CM | POA: Diagnosis not present

## 2020-04-22 DIAGNOSIS — Z7982 Long term (current) use of aspirin: Secondary | ICD-10-CM | POA: Diagnosis not present

## 2020-04-22 DIAGNOSIS — L89896 Pressure-induced deep tissue damage of other site: Secondary | ICD-10-CM | POA: Diagnosis not present

## 2020-04-22 DIAGNOSIS — Z9981 Dependence on supplemental oxygen: Secondary | ICD-10-CM | POA: Diagnosis not present

## 2020-04-22 DIAGNOSIS — I252 Old myocardial infarction: Secondary | ICD-10-CM | POA: Diagnosis not present

## 2020-04-22 DIAGNOSIS — E785 Hyperlipidemia, unspecified: Secondary | ICD-10-CM | POA: Diagnosis not present

## 2020-04-22 DIAGNOSIS — K219 Gastro-esophageal reflux disease without esophagitis: Secondary | ICD-10-CM | POA: Diagnosis not present

## 2020-04-22 DIAGNOSIS — G8929 Other chronic pain: Secondary | ICD-10-CM | POA: Diagnosis not present

## 2020-04-22 DIAGNOSIS — L89316 Pressure-induced deep tissue damage of right buttock: Secondary | ICD-10-CM | POA: Diagnosis not present

## 2020-04-22 DIAGNOSIS — K760 Fatty (change of) liver, not elsewhere classified: Secondary | ICD-10-CM | POA: Diagnosis not present

## 2020-04-22 DIAGNOSIS — I700 Atherosclerosis of aorta: Secondary | ICD-10-CM | POA: Diagnosis not present

## 2020-04-22 DIAGNOSIS — M7502 Adhesive capsulitis of left shoulder: Secondary | ICD-10-CM | POA: Diagnosis not present

## 2020-04-22 DIAGNOSIS — D649 Anemia, unspecified: Secondary | ICD-10-CM | POA: Diagnosis not present

## 2020-04-22 DIAGNOSIS — E222 Syndrome of inappropriate secretion of antidiuretic hormone: Secondary | ICD-10-CM | POA: Diagnosis not present

## 2020-04-22 DIAGNOSIS — M545 Low back pain: Secondary | ICD-10-CM | POA: Diagnosis not present

## 2020-04-22 DIAGNOSIS — K5900 Constipation, unspecified: Secondary | ICD-10-CM | POA: Diagnosis not present

## 2020-04-22 DIAGNOSIS — I69391 Dysphagia following cerebral infarction: Secondary | ICD-10-CM | POA: Diagnosis not present

## 2020-04-23 DIAGNOSIS — I700 Atherosclerosis of aorta: Secondary | ICD-10-CM | POA: Diagnosis not present

## 2020-04-23 DIAGNOSIS — G8929 Other chronic pain: Secondary | ICD-10-CM | POA: Diagnosis not present

## 2020-04-23 DIAGNOSIS — R1310 Dysphagia, unspecified: Secondary | ICD-10-CM | POA: Diagnosis not present

## 2020-04-23 DIAGNOSIS — D649 Anemia, unspecified: Secondary | ICD-10-CM | POA: Diagnosis not present

## 2020-04-23 DIAGNOSIS — K760 Fatty (change of) liver, not elsewhere classified: Secondary | ICD-10-CM | POA: Diagnosis not present

## 2020-04-23 DIAGNOSIS — I252 Old myocardial infarction: Secondary | ICD-10-CM | POA: Diagnosis not present

## 2020-04-23 DIAGNOSIS — I69391 Dysphagia following cerebral infarction: Secondary | ICD-10-CM | POA: Diagnosis not present

## 2020-04-23 DIAGNOSIS — E785 Hyperlipidemia, unspecified: Secondary | ICD-10-CM | POA: Diagnosis not present

## 2020-04-23 DIAGNOSIS — I5032 Chronic diastolic (congestive) heart failure: Secondary | ICD-10-CM | POA: Diagnosis not present

## 2020-04-23 DIAGNOSIS — E222 Syndrome of inappropriate secretion of antidiuretic hormone: Secondary | ICD-10-CM | POA: Diagnosis not present

## 2020-04-23 DIAGNOSIS — M545 Low back pain: Secondary | ICD-10-CM | POA: Diagnosis not present

## 2020-04-23 DIAGNOSIS — K5900 Constipation, unspecified: Secondary | ICD-10-CM | POA: Diagnosis not present

## 2020-04-23 DIAGNOSIS — I110 Hypertensive heart disease with heart failure: Secondary | ICD-10-CM | POA: Diagnosis not present

## 2020-04-23 DIAGNOSIS — I272 Pulmonary hypertension, unspecified: Secondary | ICD-10-CM | POA: Diagnosis not present

## 2020-04-23 DIAGNOSIS — M7502 Adhesive capsulitis of left shoulder: Secondary | ICD-10-CM | POA: Diagnosis not present

## 2020-04-23 DIAGNOSIS — L89316 Pressure-induced deep tissue damage of right buttock: Secondary | ICD-10-CM | POA: Diagnosis not present

## 2020-04-23 DIAGNOSIS — K219 Gastro-esophageal reflux disease without esophagitis: Secondary | ICD-10-CM | POA: Diagnosis not present

## 2020-04-23 DIAGNOSIS — Z9981 Dependence on supplemental oxygen: Secondary | ICD-10-CM | POA: Diagnosis not present

## 2020-04-23 DIAGNOSIS — L89896 Pressure-induced deep tissue damage of other site: Secondary | ICD-10-CM | POA: Diagnosis not present

## 2020-04-23 DIAGNOSIS — E039 Hypothyroidism, unspecified: Secondary | ICD-10-CM | POA: Diagnosis not present

## 2020-04-23 DIAGNOSIS — J9611 Chronic respiratory failure with hypoxia: Secondary | ICD-10-CM | POA: Diagnosis not present

## 2020-04-23 DIAGNOSIS — Z7982 Long term (current) use of aspirin: Secondary | ICD-10-CM | POA: Diagnosis not present

## 2020-04-25 ENCOUNTER — Ambulatory Visit: Payer: Medicare Other | Admitting: Internal Medicine

## 2020-04-25 DIAGNOSIS — E222 Syndrome of inappropriate secretion of antidiuretic hormone: Secondary | ICD-10-CM | POA: Diagnosis not present

## 2020-04-25 DIAGNOSIS — I5032 Chronic diastolic (congestive) heart failure: Secondary | ICD-10-CM | POA: Diagnosis not present

## 2020-04-25 DIAGNOSIS — L89316 Pressure-induced deep tissue damage of right buttock: Secondary | ICD-10-CM | POA: Diagnosis not present

## 2020-04-25 DIAGNOSIS — D649 Anemia, unspecified: Secondary | ICD-10-CM | POA: Diagnosis not present

## 2020-04-25 DIAGNOSIS — E039 Hypothyroidism, unspecified: Secondary | ICD-10-CM | POA: Diagnosis not present

## 2020-04-25 DIAGNOSIS — K219 Gastro-esophageal reflux disease without esophagitis: Secondary | ICD-10-CM | POA: Diagnosis not present

## 2020-04-25 DIAGNOSIS — M7502 Adhesive capsulitis of left shoulder: Secondary | ICD-10-CM | POA: Diagnosis not present

## 2020-04-25 DIAGNOSIS — J9611 Chronic respiratory failure with hypoxia: Secondary | ICD-10-CM | POA: Diagnosis not present

## 2020-04-25 DIAGNOSIS — I69391 Dysphagia following cerebral infarction: Secondary | ICD-10-CM | POA: Diagnosis not present

## 2020-04-25 DIAGNOSIS — Z9981 Dependence on supplemental oxygen: Secondary | ICD-10-CM | POA: Diagnosis not present

## 2020-04-25 DIAGNOSIS — I700 Atherosclerosis of aorta: Secondary | ICD-10-CM | POA: Diagnosis not present

## 2020-04-25 DIAGNOSIS — G8929 Other chronic pain: Secondary | ICD-10-CM | POA: Diagnosis not present

## 2020-04-25 DIAGNOSIS — E785 Hyperlipidemia, unspecified: Secondary | ICD-10-CM | POA: Diagnosis not present

## 2020-04-25 DIAGNOSIS — R1310 Dysphagia, unspecified: Secondary | ICD-10-CM | POA: Diagnosis not present

## 2020-04-25 DIAGNOSIS — I272 Pulmonary hypertension, unspecified: Secondary | ICD-10-CM | POA: Diagnosis not present

## 2020-04-25 DIAGNOSIS — Z7982 Long term (current) use of aspirin: Secondary | ICD-10-CM | POA: Diagnosis not present

## 2020-04-25 DIAGNOSIS — M545 Low back pain: Secondary | ICD-10-CM | POA: Diagnosis not present

## 2020-04-25 DIAGNOSIS — I110 Hypertensive heart disease with heart failure: Secondary | ICD-10-CM | POA: Diagnosis not present

## 2020-04-25 DIAGNOSIS — I252 Old myocardial infarction: Secondary | ICD-10-CM | POA: Diagnosis not present

## 2020-04-25 DIAGNOSIS — K5900 Constipation, unspecified: Secondary | ICD-10-CM | POA: Diagnosis not present

## 2020-04-25 DIAGNOSIS — K760 Fatty (change of) liver, not elsewhere classified: Secondary | ICD-10-CM | POA: Diagnosis not present

## 2020-04-25 DIAGNOSIS — L89896 Pressure-induced deep tissue damage of other site: Secondary | ICD-10-CM | POA: Diagnosis not present

## 2020-04-26 ENCOUNTER — Telehealth: Payer: Self-pay | Admitting: Obstetrics and Gynecology

## 2020-04-26 DIAGNOSIS — M545 Low back pain: Secondary | ICD-10-CM | POA: Diagnosis not present

## 2020-04-26 DIAGNOSIS — M7502 Adhesive capsulitis of left shoulder: Secondary | ICD-10-CM | POA: Diagnosis not present

## 2020-04-26 DIAGNOSIS — E222 Syndrome of inappropriate secretion of antidiuretic hormone: Secondary | ICD-10-CM | POA: Diagnosis not present

## 2020-04-26 DIAGNOSIS — L89316 Pressure-induced deep tissue damage of right buttock: Secondary | ICD-10-CM | POA: Diagnosis not present

## 2020-04-26 DIAGNOSIS — K5900 Constipation, unspecified: Secondary | ICD-10-CM | POA: Diagnosis not present

## 2020-04-26 DIAGNOSIS — K219 Gastro-esophageal reflux disease without esophagitis: Secondary | ICD-10-CM | POA: Diagnosis not present

## 2020-04-26 DIAGNOSIS — I5032 Chronic diastolic (congestive) heart failure: Secondary | ICD-10-CM | POA: Diagnosis not present

## 2020-04-26 DIAGNOSIS — E785 Hyperlipidemia, unspecified: Secondary | ICD-10-CM | POA: Diagnosis not present

## 2020-04-26 DIAGNOSIS — G8929 Other chronic pain: Secondary | ICD-10-CM | POA: Diagnosis not present

## 2020-04-26 DIAGNOSIS — I252 Old myocardial infarction: Secondary | ICD-10-CM | POA: Diagnosis not present

## 2020-04-26 DIAGNOSIS — L89896 Pressure-induced deep tissue damage of other site: Secondary | ICD-10-CM | POA: Diagnosis not present

## 2020-04-26 DIAGNOSIS — D649 Anemia, unspecified: Secondary | ICD-10-CM | POA: Diagnosis not present

## 2020-04-26 DIAGNOSIS — I272 Pulmonary hypertension, unspecified: Secondary | ICD-10-CM | POA: Diagnosis not present

## 2020-04-26 DIAGNOSIS — R1310 Dysphagia, unspecified: Secondary | ICD-10-CM | POA: Diagnosis not present

## 2020-04-26 DIAGNOSIS — I69391 Dysphagia following cerebral infarction: Secondary | ICD-10-CM | POA: Diagnosis not present

## 2020-04-26 DIAGNOSIS — K760 Fatty (change of) liver, not elsewhere classified: Secondary | ICD-10-CM | POA: Diagnosis not present

## 2020-04-26 DIAGNOSIS — I110 Hypertensive heart disease with heart failure: Secondary | ICD-10-CM | POA: Diagnosis not present

## 2020-04-26 DIAGNOSIS — I700 Atherosclerosis of aorta: Secondary | ICD-10-CM | POA: Diagnosis not present

## 2020-04-26 DIAGNOSIS — J9611 Chronic respiratory failure with hypoxia: Secondary | ICD-10-CM | POA: Diagnosis not present

## 2020-04-26 DIAGNOSIS — Z9981 Dependence on supplemental oxygen: Secondary | ICD-10-CM | POA: Diagnosis not present

## 2020-04-26 DIAGNOSIS — Z7982 Long term (current) use of aspirin: Secondary | ICD-10-CM | POA: Diagnosis not present

## 2020-04-26 DIAGNOSIS — E039 Hypothyroidism, unspecified: Secondary | ICD-10-CM | POA: Diagnosis not present

## 2020-04-26 NOTE — Telephone Encounter (Signed)
I received a call from the patients son stating his mother needed to see Dr Jolayne Panther. I explained to him Dr. Jolayne Panther would not be back in the office until June. He wanted to be able for his mother to see her sooner than the date I gave him. He requested for her to be transferred to another office. I sent a message to [employer] Admin pool to call, and get patient scheduled with Dr. Jolayne Panther.

## 2020-04-27 DIAGNOSIS — Z9981 Dependence on supplemental oxygen: Secondary | ICD-10-CM | POA: Diagnosis not present

## 2020-04-27 DIAGNOSIS — E222 Syndrome of inappropriate secretion of antidiuretic hormone: Secondary | ICD-10-CM | POA: Diagnosis not present

## 2020-04-27 DIAGNOSIS — M545 Low back pain: Secondary | ICD-10-CM | POA: Diagnosis not present

## 2020-04-27 DIAGNOSIS — E039 Hypothyroidism, unspecified: Secondary | ICD-10-CM | POA: Diagnosis not present

## 2020-04-27 DIAGNOSIS — L89896 Pressure-induced deep tissue damage of other site: Secondary | ICD-10-CM | POA: Diagnosis not present

## 2020-04-27 DIAGNOSIS — I110 Hypertensive heart disease with heart failure: Secondary | ICD-10-CM | POA: Diagnosis not present

## 2020-04-27 DIAGNOSIS — R1310 Dysphagia, unspecified: Secondary | ICD-10-CM | POA: Diagnosis not present

## 2020-04-27 DIAGNOSIS — I69391 Dysphagia following cerebral infarction: Secondary | ICD-10-CM | POA: Diagnosis not present

## 2020-04-27 DIAGNOSIS — K219 Gastro-esophageal reflux disease without esophagitis: Secondary | ICD-10-CM | POA: Diagnosis not present

## 2020-04-27 DIAGNOSIS — M7502 Adhesive capsulitis of left shoulder: Secondary | ICD-10-CM | POA: Diagnosis not present

## 2020-04-27 DIAGNOSIS — L89316 Pressure-induced deep tissue damage of right buttock: Secondary | ICD-10-CM | POA: Diagnosis not present

## 2020-04-27 DIAGNOSIS — J9611 Chronic respiratory failure with hypoxia: Secondary | ICD-10-CM | POA: Diagnosis not present

## 2020-04-27 DIAGNOSIS — I272 Pulmonary hypertension, unspecified: Secondary | ICD-10-CM | POA: Diagnosis not present

## 2020-04-27 DIAGNOSIS — D649 Anemia, unspecified: Secondary | ICD-10-CM | POA: Diagnosis not present

## 2020-04-27 DIAGNOSIS — I700 Atherosclerosis of aorta: Secondary | ICD-10-CM | POA: Diagnosis not present

## 2020-04-27 DIAGNOSIS — G8929 Other chronic pain: Secondary | ICD-10-CM | POA: Diagnosis not present

## 2020-04-27 DIAGNOSIS — Z7982 Long term (current) use of aspirin: Secondary | ICD-10-CM | POA: Diagnosis not present

## 2020-04-27 DIAGNOSIS — I5032 Chronic diastolic (congestive) heart failure: Secondary | ICD-10-CM | POA: Diagnosis not present

## 2020-04-27 DIAGNOSIS — K5900 Constipation, unspecified: Secondary | ICD-10-CM | POA: Diagnosis not present

## 2020-04-27 DIAGNOSIS — E785 Hyperlipidemia, unspecified: Secondary | ICD-10-CM | POA: Diagnosis not present

## 2020-04-27 DIAGNOSIS — I252 Old myocardial infarction: Secondary | ICD-10-CM | POA: Diagnosis not present

## 2020-04-27 DIAGNOSIS — K760 Fatty (change of) liver, not elsewhere classified: Secondary | ICD-10-CM | POA: Diagnosis not present

## 2020-04-28 DIAGNOSIS — L89896 Pressure-induced deep tissue damage of other site: Secondary | ICD-10-CM | POA: Diagnosis not present

## 2020-04-28 DIAGNOSIS — E039 Hypothyroidism, unspecified: Secondary | ICD-10-CM | POA: Diagnosis not present

## 2020-04-28 DIAGNOSIS — Z9981 Dependence on supplemental oxygen: Secondary | ICD-10-CM | POA: Diagnosis not present

## 2020-04-28 DIAGNOSIS — I69391 Dysphagia following cerebral infarction: Secondary | ICD-10-CM | POA: Diagnosis not present

## 2020-04-28 DIAGNOSIS — K5900 Constipation, unspecified: Secondary | ICD-10-CM | POA: Diagnosis not present

## 2020-04-28 DIAGNOSIS — M7502 Adhesive capsulitis of left shoulder: Secondary | ICD-10-CM | POA: Diagnosis not present

## 2020-04-28 DIAGNOSIS — I110 Hypertensive heart disease with heart failure: Secondary | ICD-10-CM | POA: Diagnosis not present

## 2020-04-28 DIAGNOSIS — Z7982 Long term (current) use of aspirin: Secondary | ICD-10-CM | POA: Diagnosis not present

## 2020-04-28 DIAGNOSIS — I5032 Chronic diastolic (congestive) heart failure: Secondary | ICD-10-CM | POA: Diagnosis not present

## 2020-04-28 DIAGNOSIS — D649 Anemia, unspecified: Secondary | ICD-10-CM | POA: Diagnosis not present

## 2020-04-28 DIAGNOSIS — E222 Syndrome of inappropriate secretion of antidiuretic hormone: Secondary | ICD-10-CM | POA: Diagnosis not present

## 2020-04-28 DIAGNOSIS — K219 Gastro-esophageal reflux disease without esophagitis: Secondary | ICD-10-CM | POA: Diagnosis not present

## 2020-04-28 DIAGNOSIS — E785 Hyperlipidemia, unspecified: Secondary | ICD-10-CM | POA: Diagnosis not present

## 2020-04-28 DIAGNOSIS — J9611 Chronic respiratory failure with hypoxia: Secondary | ICD-10-CM | POA: Diagnosis not present

## 2020-04-28 DIAGNOSIS — L89316 Pressure-induced deep tissue damage of right buttock: Secondary | ICD-10-CM | POA: Diagnosis not present

## 2020-04-28 DIAGNOSIS — K760 Fatty (change of) liver, not elsewhere classified: Secondary | ICD-10-CM | POA: Diagnosis not present

## 2020-04-28 DIAGNOSIS — R1310 Dysphagia, unspecified: Secondary | ICD-10-CM | POA: Diagnosis not present

## 2020-04-28 DIAGNOSIS — M545 Low back pain: Secondary | ICD-10-CM | POA: Diagnosis not present

## 2020-04-28 DIAGNOSIS — I272 Pulmonary hypertension, unspecified: Secondary | ICD-10-CM | POA: Diagnosis not present

## 2020-04-28 DIAGNOSIS — I252 Old myocardial infarction: Secondary | ICD-10-CM | POA: Diagnosis not present

## 2020-04-28 DIAGNOSIS — G8929 Other chronic pain: Secondary | ICD-10-CM | POA: Diagnosis not present

## 2020-04-28 DIAGNOSIS — I700 Atherosclerosis of aorta: Secondary | ICD-10-CM | POA: Diagnosis not present

## 2020-04-29 NOTE — Telephone Encounter (Signed)
Orders have been received, reviewed, signed and faxed with confirmation @[facility] @336 [phone]

## 2020-04-30 ENCOUNTER — Other Ambulatory Visit: Payer: Self-pay | Admitting: Family Medicine

## 2020-04-30 DIAGNOSIS — K5900 Constipation, unspecified: Secondary | ICD-10-CM | POA: Diagnosis not present

## 2020-04-30 DIAGNOSIS — R7881 Bacteremia: Secondary | ICD-10-CM | POA: Diagnosis not present

## 2020-04-30 DIAGNOSIS — R1310 Dysphagia, unspecified: Secondary | ICD-10-CM | POA: Diagnosis not present

## 2020-04-30 DIAGNOSIS — L89316 Pressure-induced deep tissue damage of right buttock: Secondary | ICD-10-CM | POA: Diagnosis not present

## 2020-04-30 DIAGNOSIS — M7502 Adhesive capsulitis of left shoulder: Secondary | ICD-10-CM | POA: Diagnosis not present

## 2020-04-30 DIAGNOSIS — Z7982 Long term (current) use of aspirin: Secondary | ICD-10-CM | POA: Diagnosis not present

## 2020-04-30 DIAGNOSIS — I700 Atherosclerosis of aorta: Secondary | ICD-10-CM | POA: Diagnosis not present

## 2020-04-30 DIAGNOSIS — E785 Hyperlipidemia, unspecified: Secondary | ICD-10-CM | POA: Diagnosis not present

## 2020-04-30 DIAGNOSIS — I5032 Chronic diastolic (congestive) heart failure: Secondary | ICD-10-CM | POA: Diagnosis not present

## 2020-04-30 DIAGNOSIS — E222 Syndrome of inappropriate secretion of antidiuretic hormone: Secondary | ICD-10-CM | POA: Diagnosis not present

## 2020-04-30 DIAGNOSIS — I110 Hypertensive heart disease with heart failure: Secondary | ICD-10-CM | POA: Diagnosis not present

## 2020-04-30 DIAGNOSIS — L89896 Pressure-induced deep tissue damage of other site: Secondary | ICD-10-CM | POA: Diagnosis not present

## 2020-04-30 DIAGNOSIS — I252 Old myocardial infarction: Secondary | ICD-10-CM | POA: Diagnosis not present

## 2020-04-30 DIAGNOSIS — I272 Pulmonary hypertension, unspecified: Secondary | ICD-10-CM | POA: Diagnosis not present

## 2020-04-30 DIAGNOSIS — E039 Hypothyroidism, unspecified: Secondary | ICD-10-CM | POA: Diagnosis not present

## 2020-04-30 DIAGNOSIS — G8929 Other chronic pain: Secondary | ICD-10-CM | POA: Diagnosis not present

## 2020-04-30 DIAGNOSIS — I69391 Dysphagia following cerebral infarction: Secondary | ICD-10-CM | POA: Diagnosis not present

## 2020-04-30 DIAGNOSIS — D649 Anemia, unspecified: Secondary | ICD-10-CM | POA: Diagnosis not present

## 2020-04-30 DIAGNOSIS — J9611 Chronic respiratory failure with hypoxia: Secondary | ICD-10-CM | POA: Diagnosis not present

## 2020-04-30 DIAGNOSIS — K760 Fatty (change of) liver, not elsewhere classified: Secondary | ICD-10-CM | POA: Diagnosis not present

## 2020-04-30 DIAGNOSIS — B9562 Methicillin resistant Staphylococcus aureus infection as the cause of diseases classified elsewhere: Secondary | ICD-10-CM | POA: Diagnosis not present

## 2020-04-30 DIAGNOSIS — J9601 Acute respiratory failure with hypoxia: Secondary | ICD-10-CM | POA: Diagnosis not present

## 2020-04-30 DIAGNOSIS — Z9981 Dependence on supplemental oxygen: Secondary | ICD-10-CM | POA: Diagnosis not present

## 2020-04-30 DIAGNOSIS — M545 Low back pain: Secondary | ICD-10-CM | POA: Diagnosis not present

## 2020-04-30 DIAGNOSIS — K219 Gastro-esophageal reflux disease without esophagitis: Secondary | ICD-10-CM | POA: Diagnosis not present

## 2020-04-30 DIAGNOSIS — I6789 Other cerebrovascular disease: Secondary | ICD-10-CM | POA: Diagnosis not present

## 2020-04-30 NOTE — Telephone Encounter (Signed)
Requested Prescriptions  Pending Prescriptions Disp Refills  . fluticasone (FLONASE) 50 MCG/ACT nasal spray [Pharmacy Med Name: FLUTICASONE NASAL SP (120) RX] 16 g 5    Sig: SHAKE LIQUID AND USE 1 SPRAY IN EACH NOSTRIL TWICE DAILY     Ear, Nose, and Throat: Nasal Preparations - Corticosteroids Passed - 04/30/2020 10:14 AM      Passed - Valid encounter within last 12 months    Recent Outpatient Visits          2 months ago Hyperkalemia   Primary Care at [facility], Manus Rudd, MD   2 months ago Essential hypertension   [facility] at [facility], Meda Coffee, MD   3 months ago Essential hypertension   [facility] at [facility], Meda Coffee, MD   3 months ago Essential hypertension   [facility] at [facility], Meda Coffee, MD   4 months ago Gastroesophageal reflux disease without esophagitis   [facility] at [facility], Meda Coffee, MD      Future Appointments            In 6 days Myles Lipps, MD Primary Care at [place], [facility]

## 2020-05-02 DIAGNOSIS — I272 Pulmonary hypertension, unspecified: Secondary | ICD-10-CM | POA: Diagnosis not present

## 2020-05-02 DIAGNOSIS — J9611 Chronic respiratory failure with hypoxia: Secondary | ICD-10-CM | POA: Diagnosis not present

## 2020-05-02 DIAGNOSIS — I700 Atherosclerosis of aorta: Secondary | ICD-10-CM | POA: Diagnosis not present

## 2020-05-02 DIAGNOSIS — D649 Anemia, unspecified: Secondary | ICD-10-CM | POA: Diagnosis not present

## 2020-05-02 DIAGNOSIS — G8929 Other chronic pain: Secondary | ICD-10-CM | POA: Diagnosis not present

## 2020-05-02 DIAGNOSIS — K5900 Constipation, unspecified: Secondary | ICD-10-CM | POA: Diagnosis not present

## 2020-05-02 DIAGNOSIS — I110 Hypertensive heart disease with heart failure: Secondary | ICD-10-CM | POA: Diagnosis not present

## 2020-05-02 DIAGNOSIS — L89316 Pressure-induced deep tissue damage of right buttock: Secondary | ICD-10-CM | POA: Diagnosis not present

## 2020-05-02 DIAGNOSIS — E785 Hyperlipidemia, unspecified: Secondary | ICD-10-CM | POA: Diagnosis not present

## 2020-05-02 DIAGNOSIS — K219 Gastro-esophageal reflux disease without esophagitis: Secondary | ICD-10-CM | POA: Diagnosis not present

## 2020-05-02 DIAGNOSIS — K760 Fatty (change of) liver, not elsewhere classified: Secondary | ICD-10-CM | POA: Diagnosis not present

## 2020-05-02 DIAGNOSIS — Z7982 Long term (current) use of aspirin: Secondary | ICD-10-CM | POA: Diagnosis not present

## 2020-05-02 DIAGNOSIS — L89896 Pressure-induced deep tissue damage of other site: Secondary | ICD-10-CM | POA: Diagnosis not present

## 2020-05-02 DIAGNOSIS — I5032 Chronic diastolic (congestive) heart failure: Secondary | ICD-10-CM | POA: Diagnosis not present

## 2020-05-02 DIAGNOSIS — I252 Old myocardial infarction: Secondary | ICD-10-CM | POA: Diagnosis not present

## 2020-05-02 DIAGNOSIS — I69391 Dysphagia following cerebral infarction: Secondary | ICD-10-CM | POA: Diagnosis not present

## 2020-05-02 DIAGNOSIS — E039 Hypothyroidism, unspecified: Secondary | ICD-10-CM | POA: Diagnosis not present

## 2020-05-02 DIAGNOSIS — M545 Low back pain: Secondary | ICD-10-CM | POA: Diagnosis not present

## 2020-05-02 DIAGNOSIS — R1310 Dysphagia, unspecified: Secondary | ICD-10-CM | POA: Diagnosis not present

## 2020-05-02 DIAGNOSIS — E222 Syndrome of inappropriate secretion of antidiuretic hormone: Secondary | ICD-10-CM | POA: Diagnosis not present

## 2020-05-02 DIAGNOSIS — M7502 Adhesive capsulitis of left shoulder: Secondary | ICD-10-CM | POA: Diagnosis not present

## 2020-05-02 DIAGNOSIS — Z9981 Dependence on supplemental oxygen: Secondary | ICD-10-CM | POA: Diagnosis not present

## 2020-05-03 DIAGNOSIS — I5032 Chronic diastolic (congestive) heart failure: Secondary | ICD-10-CM | POA: Diagnosis not present

## 2020-05-03 DIAGNOSIS — I10 Essential (primary) hypertension: Secondary | ICD-10-CM | POA: Diagnosis not present

## 2020-05-06 ENCOUNTER — Ambulatory Visit: Payer: Self-pay | Admitting: Family Medicine

## 2020-05-06 DIAGNOSIS — M7502 Adhesive capsulitis of left shoulder: Secondary | ICD-10-CM | POA: Diagnosis not present

## 2020-05-06 DIAGNOSIS — D649 Anemia, unspecified: Secondary | ICD-10-CM | POA: Diagnosis not present

## 2020-05-06 DIAGNOSIS — I110 Hypertensive heart disease with heart failure: Secondary | ICD-10-CM | POA: Diagnosis not present

## 2020-05-06 DIAGNOSIS — L89316 Pressure-induced deep tissue damage of right buttock: Secondary | ICD-10-CM | POA: Diagnosis not present

## 2020-05-06 DIAGNOSIS — I272 Pulmonary hypertension, unspecified: Secondary | ICD-10-CM | POA: Diagnosis not present

## 2020-05-06 DIAGNOSIS — E039 Hypothyroidism, unspecified: Secondary | ICD-10-CM | POA: Diagnosis not present

## 2020-05-06 DIAGNOSIS — Z7982 Long term (current) use of aspirin: Secondary | ICD-10-CM | POA: Diagnosis not present

## 2020-05-06 DIAGNOSIS — K760 Fatty (change of) liver, not elsewhere classified: Secondary | ICD-10-CM | POA: Diagnosis not present

## 2020-05-06 DIAGNOSIS — R1310 Dysphagia, unspecified: Secondary | ICD-10-CM | POA: Diagnosis not present

## 2020-05-06 DIAGNOSIS — I252 Old myocardial infarction: Secondary | ICD-10-CM | POA: Diagnosis not present

## 2020-05-06 DIAGNOSIS — I69391 Dysphagia following cerebral infarction: Secondary | ICD-10-CM | POA: Diagnosis not present

## 2020-05-06 DIAGNOSIS — Z87440 Personal history of urinary (tract) infections: Secondary | ICD-10-CM | POA: Diagnosis not present

## 2020-05-06 DIAGNOSIS — M545 Low back pain: Secondary | ICD-10-CM | POA: Diagnosis not present

## 2020-05-06 DIAGNOSIS — G8929 Other chronic pain: Secondary | ICD-10-CM | POA: Diagnosis not present

## 2020-05-06 DIAGNOSIS — Z9981 Dependence on supplemental oxygen: Secondary | ICD-10-CM | POA: Diagnosis not present

## 2020-05-06 DIAGNOSIS — E222 Syndrome of inappropriate secretion of antidiuretic hormone: Secondary | ICD-10-CM | POA: Diagnosis not present

## 2020-05-06 DIAGNOSIS — Z87891 Personal history of nicotine dependence: Secondary | ICD-10-CM | POA: Diagnosis not present

## 2020-05-06 DIAGNOSIS — Z9181 History of falling: Secondary | ICD-10-CM | POA: Diagnosis not present

## 2020-05-06 DIAGNOSIS — E785 Hyperlipidemia, unspecified: Secondary | ICD-10-CM | POA: Diagnosis not present

## 2020-05-06 DIAGNOSIS — I5032 Chronic diastolic (congestive) heart failure: Secondary | ICD-10-CM | POA: Diagnosis not present

## 2020-05-06 DIAGNOSIS — J9611 Chronic respiratory failure with hypoxia: Secondary | ICD-10-CM | POA: Diagnosis not present

## 2020-05-06 DIAGNOSIS — K219 Gastro-esophageal reflux disease without esophagitis: Secondary | ICD-10-CM | POA: Diagnosis not present

## 2020-05-09 ENCOUNTER — Other Ambulatory Visit: Payer: Self-pay | Admitting: Family Medicine

## 2020-05-09 DIAGNOSIS — Z9181 History of falling: Secondary | ICD-10-CM | POA: Diagnosis not present

## 2020-05-09 DIAGNOSIS — E222 Syndrome of inappropriate secretion of antidiuretic hormone: Secondary | ICD-10-CM | POA: Diagnosis not present

## 2020-05-09 DIAGNOSIS — Z87891 Personal history of nicotine dependence: Secondary | ICD-10-CM | POA: Diagnosis not present

## 2020-05-09 DIAGNOSIS — D649 Anemia, unspecified: Secondary | ICD-10-CM | POA: Diagnosis not present

## 2020-05-09 DIAGNOSIS — K760 Fatty (change of) liver, not elsewhere classified: Secondary | ICD-10-CM | POA: Diagnosis not present

## 2020-05-09 DIAGNOSIS — G8929 Other chronic pain: Secondary | ICD-10-CM | POA: Diagnosis not present

## 2020-05-09 DIAGNOSIS — I110 Hypertensive heart disease with heart failure: Secondary | ICD-10-CM | POA: Diagnosis not present

## 2020-05-09 DIAGNOSIS — I272 Pulmonary hypertension, unspecified: Secondary | ICD-10-CM | POA: Diagnosis not present

## 2020-05-09 DIAGNOSIS — J9611 Chronic respiratory failure with hypoxia: Secondary | ICD-10-CM | POA: Diagnosis not present

## 2020-05-09 DIAGNOSIS — E785 Hyperlipidemia, unspecified: Secondary | ICD-10-CM | POA: Diagnosis not present

## 2020-05-09 DIAGNOSIS — M545 Low back pain: Secondary | ICD-10-CM | POA: Diagnosis not present

## 2020-05-09 DIAGNOSIS — Z7982 Long term (current) use of aspirin: Secondary | ICD-10-CM | POA: Diagnosis not present

## 2020-05-09 DIAGNOSIS — E039 Hypothyroidism, unspecified: Secondary | ICD-10-CM | POA: Diagnosis not present

## 2020-05-09 DIAGNOSIS — M7502 Adhesive capsulitis of left shoulder: Secondary | ICD-10-CM | POA: Diagnosis not present

## 2020-05-09 DIAGNOSIS — L89316 Pressure-induced deep tissue damage of right buttock: Secondary | ICD-10-CM | POA: Diagnosis not present

## 2020-05-09 DIAGNOSIS — R1310 Dysphagia, unspecified: Secondary | ICD-10-CM | POA: Diagnosis not present

## 2020-05-09 DIAGNOSIS — I5032 Chronic diastolic (congestive) heart failure: Secondary | ICD-10-CM | POA: Diagnosis not present

## 2020-05-09 DIAGNOSIS — I69391 Dysphagia following cerebral infarction: Secondary | ICD-10-CM | POA: Diagnosis not present

## 2020-05-09 DIAGNOSIS — Z87440 Personal history of urinary (tract) infections: Secondary | ICD-10-CM | POA: Diagnosis not present

## 2020-05-09 DIAGNOSIS — K219 Gastro-esophageal reflux disease without esophagitis: Secondary | ICD-10-CM | POA: Diagnosis not present

## 2020-05-09 DIAGNOSIS — Z9981 Dependence on supplemental oxygen: Secondary | ICD-10-CM | POA: Diagnosis not present

## 2020-05-09 DIAGNOSIS — I252 Old myocardial infarction: Secondary | ICD-10-CM | POA: Diagnosis not present

## 2020-05-09 NOTE — Telephone Encounter (Signed)
Requesting refill one week early. Spoke with patient's son who is her caretaker. He is administering as prescribed. She has a six day supply on hand. He is trying to stay on top of medications on hand. Approving request for one month supply.

## 2020-05-12 DIAGNOSIS — I110 Hypertensive heart disease with heart failure: Secondary | ICD-10-CM | POA: Diagnosis not present

## 2020-05-12 DIAGNOSIS — K219 Gastro-esophageal reflux disease without esophagitis: Secondary | ICD-10-CM | POA: Diagnosis not present

## 2020-05-12 DIAGNOSIS — Z9181 History of falling: Secondary | ICD-10-CM | POA: Diagnosis not present

## 2020-05-12 DIAGNOSIS — E039 Hypothyroidism, unspecified: Secondary | ICD-10-CM | POA: Diagnosis not present

## 2020-05-12 DIAGNOSIS — Z87891 Personal history of nicotine dependence: Secondary | ICD-10-CM | POA: Diagnosis not present

## 2020-05-12 DIAGNOSIS — I5032 Chronic diastolic (congestive) heart failure: Secondary | ICD-10-CM | POA: Diagnosis not present

## 2020-05-12 DIAGNOSIS — D649 Anemia, unspecified: Secondary | ICD-10-CM | POA: Diagnosis not present

## 2020-05-12 DIAGNOSIS — G8929 Other chronic pain: Secondary | ICD-10-CM | POA: Diagnosis not present

## 2020-05-12 DIAGNOSIS — Z87440 Personal history of urinary (tract) infections: Secondary | ICD-10-CM | POA: Diagnosis not present

## 2020-05-12 DIAGNOSIS — M545 Low back pain: Secondary | ICD-10-CM | POA: Diagnosis not present

## 2020-05-12 DIAGNOSIS — K760 Fatty (change of) liver, not elsewhere classified: Secondary | ICD-10-CM | POA: Diagnosis not present

## 2020-05-12 DIAGNOSIS — M7502 Adhesive capsulitis of left shoulder: Secondary | ICD-10-CM | POA: Diagnosis not present

## 2020-05-12 DIAGNOSIS — I69391 Dysphagia following cerebral infarction: Secondary | ICD-10-CM | POA: Diagnosis not present

## 2020-05-12 DIAGNOSIS — L89316 Pressure-induced deep tissue damage of right buttock: Secondary | ICD-10-CM | POA: Diagnosis not present

## 2020-05-12 DIAGNOSIS — I272 Pulmonary hypertension, unspecified: Secondary | ICD-10-CM | POA: Diagnosis not present

## 2020-05-12 DIAGNOSIS — J9611 Chronic respiratory failure with hypoxia: Secondary | ICD-10-CM | POA: Diagnosis not present

## 2020-05-12 DIAGNOSIS — R1310 Dysphagia, unspecified: Secondary | ICD-10-CM | POA: Diagnosis not present

## 2020-05-12 DIAGNOSIS — Z7982 Long term (current) use of aspirin: Secondary | ICD-10-CM | POA: Diagnosis not present

## 2020-05-12 DIAGNOSIS — Z9981 Dependence on supplemental oxygen: Secondary | ICD-10-CM | POA: Diagnosis not present

## 2020-05-12 DIAGNOSIS — E785 Hyperlipidemia, unspecified: Secondary | ICD-10-CM | POA: Diagnosis not present

## 2020-05-12 DIAGNOSIS — I252 Old myocardial infarction: Secondary | ICD-10-CM | POA: Diagnosis not present

## 2020-05-12 DIAGNOSIS — E222 Syndrome of inappropriate secretion of antidiuretic hormone: Secondary | ICD-10-CM | POA: Diagnosis not present

## 2020-05-17 DIAGNOSIS — K219 Gastro-esophageal reflux disease without esophagitis: Secondary | ICD-10-CM | POA: Diagnosis not present

## 2020-05-17 DIAGNOSIS — D649 Anemia, unspecified: Secondary | ICD-10-CM | POA: Diagnosis not present

## 2020-05-17 DIAGNOSIS — E222 Syndrome of inappropriate secretion of antidiuretic hormone: Secondary | ICD-10-CM | POA: Diagnosis not present

## 2020-05-17 DIAGNOSIS — I272 Pulmonary hypertension, unspecified: Secondary | ICD-10-CM | POA: Diagnosis not present

## 2020-05-17 DIAGNOSIS — E785 Hyperlipidemia, unspecified: Secondary | ICD-10-CM | POA: Diagnosis not present

## 2020-05-17 DIAGNOSIS — I252 Old myocardial infarction: Secondary | ICD-10-CM | POA: Diagnosis not present

## 2020-05-17 DIAGNOSIS — G8929 Other chronic pain: Secondary | ICD-10-CM | POA: Diagnosis not present

## 2020-05-17 DIAGNOSIS — M7502 Adhesive capsulitis of left shoulder: Secondary | ICD-10-CM | POA: Diagnosis not present

## 2020-05-17 DIAGNOSIS — E039 Hypothyroidism, unspecified: Secondary | ICD-10-CM | POA: Diagnosis not present

## 2020-05-17 DIAGNOSIS — Z9981 Dependence on supplemental oxygen: Secondary | ICD-10-CM | POA: Diagnosis not present

## 2020-05-17 DIAGNOSIS — L89316 Pressure-induced deep tissue damage of right buttock: Secondary | ICD-10-CM | POA: Diagnosis not present

## 2020-05-17 DIAGNOSIS — I110 Hypertensive heart disease with heart failure: Secondary | ICD-10-CM | POA: Diagnosis not present

## 2020-05-17 DIAGNOSIS — Z87440 Personal history of urinary (tract) infections: Secondary | ICD-10-CM | POA: Diagnosis not present

## 2020-05-17 DIAGNOSIS — Z87891 Personal history of nicotine dependence: Secondary | ICD-10-CM | POA: Diagnosis not present

## 2020-05-17 DIAGNOSIS — Z9181 History of falling: Secondary | ICD-10-CM | POA: Diagnosis not present

## 2020-05-17 DIAGNOSIS — J9611 Chronic respiratory failure with hypoxia: Secondary | ICD-10-CM | POA: Diagnosis not present

## 2020-05-17 DIAGNOSIS — M545 Low back pain: Secondary | ICD-10-CM | POA: Diagnosis not present

## 2020-05-17 DIAGNOSIS — Z7982 Long term (current) use of aspirin: Secondary | ICD-10-CM | POA: Diagnosis not present

## 2020-05-17 DIAGNOSIS — I5032 Chronic diastolic (congestive) heart failure: Secondary | ICD-10-CM | POA: Diagnosis not present

## 2020-05-17 DIAGNOSIS — K760 Fatty (change of) liver, not elsewhere classified: Secondary | ICD-10-CM | POA: Diagnosis not present

## 2020-05-17 DIAGNOSIS — R1310 Dysphagia, unspecified: Secondary | ICD-10-CM | POA: Diagnosis not present

## 2020-05-17 DIAGNOSIS — I69391 Dysphagia following cerebral infarction: Secondary | ICD-10-CM | POA: Diagnosis not present

## 2020-05-18 DIAGNOSIS — J9601 Acute respiratory failure with hypoxia: Secondary | ICD-10-CM | POA: Diagnosis not present

## 2020-05-18 DIAGNOSIS — R7881 Bacteremia: Secondary | ICD-10-CM | POA: Diagnosis not present

## 2020-05-18 DIAGNOSIS — J441 Chronic obstructive pulmonary disease with (acute) exacerbation: Secondary | ICD-10-CM | POA: Diagnosis not present

## 2020-05-18 DIAGNOSIS — I6789 Other cerebrovascular disease: Secondary | ICD-10-CM | POA: Diagnosis not present

## 2020-05-19 DIAGNOSIS — E876 Hypokalemia: Secondary | ICD-10-CM | POA: Diagnosis not present

## 2020-05-19 DIAGNOSIS — I5032 Chronic diastolic (congestive) heart failure: Secondary | ICD-10-CM | POA: Diagnosis not present

## 2020-05-19 DIAGNOSIS — I10 Essential (primary) hypertension: Secondary | ICD-10-CM | POA: Diagnosis not present

## 2020-05-19 DIAGNOSIS — E871 Hypo-osmolality and hyponatremia: Secondary | ICD-10-CM | POA: Diagnosis not present

## 2020-05-23 ENCOUNTER — Other Ambulatory Visit: Payer: Self-pay | Admitting: *Deleted

## 2020-05-23 NOTE — Patient Outreach (Signed)
Referral for social work, assigned Reece Levy, [address]

## 2020-05-23 NOTE — Patient Outreach (Signed)
[facility]) Care Management  [place]  05/23/2020   Tammy Fernandez October 24, 1924 [id number]  RN Health Coach telephone call to patient.  [employer] compliance verified. Per son [name] patient is doing good. Per son patient appetite has picked up and she has gained some weight. Per son no ankle or hand edema. He is weighing patientonce a week. Patient uses a sleep CPAP.  At time patient  has a runny nose when taking her CPAP off. RN discussed monitoring the humidification. Patient O2 sats is 100 % on 1 1/2 liters and is 94-96% when ambulating. Patient son is wanting a care giver to come and help with light cleaning and bathing for mom. Family has agreed to further outreach calls.   Encounter Medications:  Outpatient Encounter Medications as of 05/23/2020  Medication Sig  . albuterol (PROVENTIL) (2.5 MG/3ML) 0.083% nebulizer solution USE 3 ML VIA NEBULIZER EVERY 4 HOURS AS NEEDED FOR WHEEZING OR SHORTNESS OF BREATH  . ALPRAZolam (XANAX) 1 MG tablet TAKE 1 TABLET(1 MG) BY MOUTH TWICE DAILY AS NEEDED FOR ANXIETY  . amLODipine (NORVASC) 10 MG tablet TAKE 1 TABLET(10 MG) BY MOUTH DAILY  . amoxicillin-clavulanate (AUGMENTIN) 875-125 MG tablet Take 1 tablet by mouth 2 (two) times daily.  . Aspirin 81 MG EC tablet Take 81 mg by mouth daily.  . cetirizine (ZYRTEC) 10 MG tablet Take 10 mg by mouth daily.  . clobetasol ointment (TEMOVATE) 0.05 % Apply to affected area every night for 4 weeks, then every other day for 4 weeks and then twice a week for 4 weeks or until resolution.  Marland Kitchen demeclocycline (DECLOMYCIN) 300 MG tablet TAKE 1/2 TABLET BY MOUTH TWICE DAILY  . diclofenac sodium (VOLTAREN) 1 % GEL Apply 2 g topically 4 (four) times daily.  Marland Kitchen docusate sodium (COLACE) 100 MG capsule Take 100 mg by mouth daily as needed for mild constipation.  . fluticasone (FLONASE) 50 MCG/ACT nasal spray SHAKE LIQUID AND USE 1 SPRAY IN EACH NOSTRIL TWICE DAILY  . glycerin adult 2 g suppository Place 1  suppository rectally as needed for constipation.  . hydrALAZINE (APRESOLINE) 10 MG tablet TAKE 1 TABLET(10 MG) BY MOUTH TWICE DAILY  . ipratropium (ATROVENT) 0.03 % nasal spray USE 2 SPRAYS IN EACH NOSTRIL TWICE DAILY  . levothyroxine (SYNTHROID) 50 MCG tablet TAKE 1 TABLET(50 MCG) BY MOUTH DAILY BEFORE BREAKFAST  . meclizine (ANTIVERT) 12.5 MG tablet Take 1 tablet (12.5 mg total) by mouth 3 (three) times daily as needed for dizziness.  Marland Kitchen olopatadine (PATANOL) 0.1 % ophthalmic solution Place 1 drop into both eyes daily as needed for allergies.  Marland Kitchen omeprazole (PRILOSEC) 20 MG capsule Take 1 capsule (20 mg total) by mouth 2 (two) times daily before a meal.  . propranolol ER (INDERAL LA) 60 MG 24 hr capsule TAKE 1 CAPSULE(60 MG) BY MOUTH DAILY  . Respiratory Therapy Supplies (NEBULIZER) DEVI Use with nebulized albuterol solution as needed for wheezing, cough or shortness of breath   No facility-administered encounter medications on file as of 05/23/2020.    Functional Status:  In your present state of health, do you have any difficulty performing the following activities: 09/21/2019  Hearing? N  Vision? N  Difficulty concentrating or making decisions? Y  Comment some short term memory loss  Walking or climbing stairs? Y  Comment uses a walker or wheelchair  Doing errands, shopping? Y  Comment needs Agricultural engineer and eating ? Y  Comment son is caregiver  In the  past six months, have you accidently leaked urine? Y  Comment Per son sometimes incont  Do you have problems with loss of bowel control? N  Managing your Medications? Y  Comment son prepares  Managing your Finances? Y  Comment son Database administrator or managing your Housekeeping? Y  Comment lives with son/ son caregiver  Some recent data might be hidden    Fall/Depression Screening: Fall Risk  02/22/2020 02/09/2020 01/28/2020  Falls in the past year? 0 0 0  Number falls in past yr: 0 0 0  Injury with Fall? 0 0 0   Risk for fall due to : Impaired balance/gait;Impaired mobility - -  Risk for fall due to: Comment - - -  Follow up Falls evaluation completed;Falls prevention discussed Falls evaluation completed Falls evaluation completed   PHQ 2/9 Scores 02/09/2020 01/28/2020 01/12/2020 12/10/2019 11/05/2019 10/22/2019 09/21/2019  PHQ - 2 Score 0 0 0 0 0 0 0  PHQ- 9 Score - - - - - - -    Assessment:  Weighs weekly Monitors blood pressure at home O2 sats on 1 1/2 liters @ 100% When ambulating O2 sats is 94-96% Patient does not have a routine exercise Patient does not have a medical alert system Plan:  RN discussed medical alert system RN sent Exercise program activity booklet Referred to social worker RN discussed health maintenance RN will follow up within the month of September  Gean Maidens BSN RN [facility] [phone]

## 2020-05-24 ENCOUNTER — Ambulatory Visit: Payer: Medicare Other | Admitting: *Deleted

## 2020-05-24 DIAGNOSIS — I5032 Chronic diastolic (congestive) heart failure: Secondary | ICD-10-CM | POA: Diagnosis not present

## 2020-05-24 DIAGNOSIS — R1310 Dysphagia, unspecified: Secondary | ICD-10-CM | POA: Diagnosis not present

## 2020-05-24 DIAGNOSIS — G8929 Other chronic pain: Secondary | ICD-10-CM | POA: Diagnosis not present

## 2020-05-24 DIAGNOSIS — I110 Hypertensive heart disease with heart failure: Secondary | ICD-10-CM | POA: Diagnosis not present

## 2020-05-24 DIAGNOSIS — M7502 Adhesive capsulitis of left shoulder: Secondary | ICD-10-CM | POA: Diagnosis not present

## 2020-05-24 DIAGNOSIS — J9611 Chronic respiratory failure with hypoxia: Secondary | ICD-10-CM | POA: Diagnosis not present

## 2020-05-24 DIAGNOSIS — Z9981 Dependence on supplemental oxygen: Secondary | ICD-10-CM | POA: Diagnosis not present

## 2020-05-24 DIAGNOSIS — Z87891 Personal history of nicotine dependence: Secondary | ICD-10-CM | POA: Diagnosis not present

## 2020-05-24 DIAGNOSIS — E785 Hyperlipidemia, unspecified: Secondary | ICD-10-CM | POA: Diagnosis not present

## 2020-05-24 DIAGNOSIS — D649 Anemia, unspecified: Secondary | ICD-10-CM | POA: Diagnosis not present

## 2020-05-24 DIAGNOSIS — I272 Pulmonary hypertension, unspecified: Secondary | ICD-10-CM | POA: Diagnosis not present

## 2020-05-24 DIAGNOSIS — K760 Fatty (change of) liver, not elsewhere classified: Secondary | ICD-10-CM | POA: Diagnosis not present

## 2020-05-24 DIAGNOSIS — Z7982 Long term (current) use of aspirin: Secondary | ICD-10-CM | POA: Diagnosis not present

## 2020-05-24 DIAGNOSIS — M545 Low back pain: Secondary | ICD-10-CM | POA: Diagnosis not present

## 2020-05-24 DIAGNOSIS — I252 Old myocardial infarction: Secondary | ICD-10-CM | POA: Diagnosis not present

## 2020-05-24 DIAGNOSIS — K219 Gastro-esophageal reflux disease without esophagitis: Secondary | ICD-10-CM | POA: Diagnosis not present

## 2020-05-24 DIAGNOSIS — Z9181 History of falling: Secondary | ICD-10-CM | POA: Diagnosis not present

## 2020-05-24 DIAGNOSIS — E222 Syndrome of inappropriate secretion of antidiuretic hormone: Secondary | ICD-10-CM | POA: Diagnosis not present

## 2020-05-24 DIAGNOSIS — Z87440 Personal history of urinary (tract) infections: Secondary | ICD-10-CM | POA: Diagnosis not present

## 2020-05-24 DIAGNOSIS — I69391 Dysphagia following cerebral infarction: Secondary | ICD-10-CM | POA: Diagnosis not present

## 2020-05-24 DIAGNOSIS — L89316 Pressure-induced deep tissue damage of right buttock: Secondary | ICD-10-CM | POA: Diagnosis not present

## 2020-05-24 DIAGNOSIS — E039 Hypothyroidism, unspecified: Secondary | ICD-10-CM | POA: Diagnosis not present

## 2020-05-27 ENCOUNTER — Other Ambulatory Visit: Payer: Self-pay | Admitting: *Deleted

## 2020-05-27 NOTE — Patient Outreach (Signed)
[facility]) Care Management  05/27/2020  JACE DOWE [birth year] [id number]     CSW received referral on 05/24/2020 to assist pt/family with possible caregiver support. CSW made contact with pt and confirmed identity.  CSW also made contact with pt's son (verbal consent from pt) and introduced self, role and reason for call.  Pt and son both agree that additional in home support would be helpful. Pt's son lives with her and works so she is home alone some.  Per son, there is other family support that comes in and helps at times but nothing consistent.  CSW discussed the possible programs through Medicaid [facility] and [employer]) that may be beneficial.  CSW advised them that [employer] has a waiting list however she likely can get [employer] care once an application is completed by family and PCP and processed.  CSW offered to mail a packet to son and they will complete this and take to PCP visit on 7.6.2021 for MD signature/input. CSW offered to assist son with any questions regarding the application once he receives it.  CSW will have P[employer] application packet mailed to pt/son and await callback prior to PCP visit.    Reece Levy, MSW, LCSW Clinical Social Worker  [facility] [employer] [phone]

## 2020-05-31 ENCOUNTER — Ambulatory Visit (INDEPENDENT_AMBULATORY_CARE_PROVIDER_SITE_OTHER): Payer: Medicare Other | Admitting: Primary Care

## 2020-05-31 ENCOUNTER — Other Ambulatory Visit: Payer: Self-pay

## 2020-05-31 ENCOUNTER — Encounter: Payer: Self-pay | Admitting: Primary Care

## 2020-05-31 ENCOUNTER — Other Ambulatory Visit: Payer: Self-pay | Admitting: Family Medicine

## 2020-05-31 VITALS — BP 118/60 | HR 57 | Temp 97.3°F | Ht 62.0 in | Wt 159.0 lb

## 2020-05-31 DIAGNOSIS — J40 Bronchitis, not specified as acute or chronic: Secondary | ICD-10-CM

## 2020-05-31 DIAGNOSIS — Z9989 Dependence on other enabling machines and devices: Secondary | ICD-10-CM

## 2020-05-31 DIAGNOSIS — J9611 Chronic respiratory failure with hypoxia: Secondary | ICD-10-CM | POA: Diagnosis not present

## 2020-05-31 DIAGNOSIS — E871 Hypo-osmolality and hyponatremia: Secondary | ICD-10-CM | POA: Diagnosis not present

## 2020-05-31 DIAGNOSIS — R7881 Bacteremia: Secondary | ICD-10-CM | POA: Diagnosis not present

## 2020-05-31 DIAGNOSIS — J9601 Acute respiratory failure with hypoxia: Secondary | ICD-10-CM | POA: Diagnosis not present

## 2020-05-31 DIAGNOSIS — I6789 Other cerebrovascular disease: Secondary | ICD-10-CM | POA: Diagnosis not present

## 2020-05-31 DIAGNOSIS — I2721 Secondary pulmonary arterial hypertension: Secondary | ICD-10-CM

## 2020-05-31 DIAGNOSIS — J329 Chronic sinusitis, unspecified: Secondary | ICD-10-CM

## 2020-05-31 DIAGNOSIS — G4733 Obstructive sleep apnea (adult) (pediatric): Secondary | ICD-10-CM

## 2020-05-31 DIAGNOSIS — B9562 Methicillin resistant Staphylococcus aureus infection as the cause of diseases classified elsewhere: Secondary | ICD-10-CM | POA: Diagnosis not present

## 2020-05-31 DIAGNOSIS — E222 Syndrome of inappropriate secretion of antidiuretic hormone: Secondary | ICD-10-CM | POA: Diagnosis not present

## 2020-05-31 MED ORDER — AZELASTINE HCL 0.1 % NA SOLN: 1.0000 | Freq: Two times a day (BID) | NASAL | 12 refills | Status: DC

## 2020-05-31 MED ORDER — AMOXICILLIN-POT CLAVULANATE 875-125 MG PO TABS: 1.0000 | ORAL_TABLET | Freq: Two times a day (BID) | ORAL | 0 refills | Status: DC

## 2020-05-31 NOTE — Telephone Encounter (Signed)
Requested Prescriptions  Pending Prescriptions Disp Refills   albuterol (PROVENTIL) (2.5 MG/3ML) 0.083% nebulizer solution [Pharmacy Med Name: ALBUTEROL 0.083%(2.5MG /3ML) 25X3ML] 75 mL 0    Sig: USE 3 ML VIA NEBULIZER EVERY 4 HOURS AS NEEDED FOR WHEEZING OR SHORTNESS OF BREATH     Pulmonology:  Beta Agonists Failed - 05/31/2020  1:31 PM      Failed - One inhaler should last at least one month. If the patient is requesting refills earlier, contact the patient to check for uncontrolled symptoms.      Passed - Valid encounter within last 12 months    Recent Outpatient Visits          3 months ago Hyperkalemia   Primary Care at [facility], Manus Rudd, MD   3 months ago Essential hypertension   [facility] Care at [facility], Meda Coffee, MD   4 months ago Essential hypertension   [facility] at [facility], Meda Coffee, MD   4 months ago Essential hypertension   [facility] at [facility], Meda Coffee, MD   5 months ago Gastroesophageal reflux disease without esophagitis   [facility] Care at [facility], Meda Coffee, MD      Future Appointments            In 1 week Myles Lipps, MD Primary Care at [place], [facility]   In 2 weeks [place], Eda Paschal, PA-C [facility], [facility]

## 2020-05-31 NOTE — Progress Notes (Signed)
@Patient  ID: , female    DOB: 12-13-1923, 86 y.o.   MRN: [id number]  Chief Complaint  Patient presents with  . Follow-up    severe pulm hypertension    Referring provider: [id number], MD  HPI: 86 year old female. PMH significant for pulmonary HTN, diastolic HF, HTN, NSTEMI, aspiration pneumonia, chronic respiratory failure, GERD, hypothyroidism, SIADH. Patient of Dr. 83, last seen on 09/29/20.   Previous [facility] pulmonary encounters: PMH of pneumonia about 3 years ago, treated in the hospital. She was there for a long time and it took her a long time to recover per the patient.  Patient states that she has a relatively good life.  Is able to do most things she wants.  However when asking about her functional status she does state that she gets significantly short of breath with short of breath getting up from chair walking to the door.  She must walk with assistance assistance.  She is breathless with getting dressed.  Her mMRC is a 4.  She is a lifelong non-smoker.  She also denies illicit drug and alcohol abuse.  When asked about her goals in life and her current care plan.  She was pretty adamant to go back in the hospital if at all possible.  When I mentioned discussions regarding ICU admission or than ever the need for life support if she would ever want this sure that she would never want this.  However family that was in the room would interject and persuade differently.  The family also spent majority of the time talking about all of her medical problems and how they are trying to manage them at home.  The patient seems to be more concerned about eating the right things doing the right things to live longer.  She does not understand why she needs to wear oxygen.  Wants to know if she can get rid of her oxygen.  OV 09/30/2019: Patient here today for follow-up regarding her pulmonary hypertension.  She also has OSA on CPAP has been on this for several years.  Issues  at night include waking up constantly to spit.  She feels like she has trouble controlling her secretions at nighttime.  And routinely she will just spit into her CPAP mask.  She feels like she needs to have this fixed and she hates wearing it she does not wear it anymore.  As for her pulmonary hypertension her fluid management has maintained.  She still requiring oxygen but otherwise no additional change in her respiratory symptoms.  She has not had any significant lower extremity edema.  05/31/2020- interim hx Patient presents today to re-qualify for oxygen. Complains of nasal congestion and dryness. Post nasal drip upsets her stomach. She has a hard time getting mucus up. She is on declomycin 300mg  1/2 tab twice daily for hx hyponatremia.  She had some improvement on Zpack but sinusitis symptoms returned soon after. She also has vertigo symtpoms. She uses 1.5-2L oxygen at home.  Son states her O2 level can be 100% on oxygen at home. Wears CPAP at night with 1.5L oxygen blended into it.  [employer] is her DME company.    Allergies  Allergen Reactions  . Crestor [Rosuvastatin Calcium] Other (See Comments)    Muscle Aches  . Keflex [Cephalexin] Other (See Comments)    dizziness  . Peanut Butter Flavor Hives  . Sulfa Antibiotics Hives  . Cephalexin Other (See Comments)    dizziness  . Sulfa  Drugs Cross Reactors Rash    Immunization History  Administered Date(s) Administered  . Fluad Quad(high Dose 65+) 08/07/2019  . Influenza-Unspecified 09/02/2013, 09/15/2018  . Pneumococcal Conjugate-13 08/05/2018  . Pneumococcal-Unspecified 09/02/2013    Past Medical History:  Diagnosis Date  . Anxiety   . Chronic lower back pain   . Constipation    h/o fecal disimpaction on 2017  . CVA (cerebral vascular accident) (HCC) 2015   neg workup, MRI small acute lacunar infarcts. ASA only  . Depression   . Diastolic CHF, chronic (HCC)    grade I, most recent echo Jan 2019  . Fatty liver   . GERD  (gastroesophageal reflux disease)   . Hyperlipidemia   . Hypertension   . Hyponatremia    multiple episodes with encephalopathy, renal thinks SIADH  . Hypothyroidism, unspecified   . Internal hemorrhoid   . NSTEMI (non-ST elevated myocardial infarction) (HCC) 08/28/2016  . Palpitations   . PFO with atrial septal aneurysm    TTE 2015  . Pneumonia 2016  . Severe pulmonary arterial systolic hypertension (HCC) 12/25/2017   per echo, on 1L oxgen via     Tobacco History: Social History   Tobacco Use  Smoking Status Never Smoker  Smokeless Tobacco Former Neurosurgeon  . Types: Snuff  Tobacco Comment   "used snuff when I was 19"   Counseling given: Not Answered Comment: "used snuff when I was 19"   Outpatient Medications Prior to Visit  Medication Sig Dispense Refill  . albuterol (PROVENTIL) (2.5 MG/3ML) 0.083% nebulizer solution USE 3 ML VIA NEBULIZER EVERY 4 HOURS AS NEEDED FOR WHEEZING OR SHORTNESS OF BREATH 75 mL 0  . ALPRAZolam (XANAX) 1 MG tablet TAKE 1 TABLET(1 MG) BY MOUTH TWICE DAILY AS NEEDED FOR ANXIETY 60 tablet 2  . amLODipine (NORVASC) 10 MG tablet TAKE 1 TABLET(10 MG) BY MOUTH DAILY 90 tablet 1  . Aspirin 81 MG EC tablet Take 81 mg by mouth daily.    . cetirizine (ZYRTEC) 10 MG tablet Take 10 mg by mouth daily.    . clobetasol ointment (TEMOVATE) 0.05 % Apply to affected area every night for 4 weeks, then every other day for 4 weeks and then twice a week for 4 weeks or until resolution. 30 g 3  . demeclocycline (DECLOMYCIN) 300 MG tablet TAKE 1/2 TABLET BY MOUTH TWICE DAILY 60 tablet 5  . diclofenac sodium (VOLTAREN) 1 % GEL Apply 2 g topically 4 (four) times daily. 100 g 5  . docusate sodium (COLACE) 100 MG capsule Take 100 mg by mouth daily as needed for mild constipation.    . fluticasone (FLONASE) 50 MCG/ACT nasal spray SHAKE LIQUID AND USE 1 SPRAY IN EACH NOSTRIL TWICE DAILY 16 g 5  . glycerin adult 2 g suppository Place 1 suppository rectally as needed for  constipation. 12 suppository 2  . hydrALAZINE (APRESOLINE) 10 MG tablet TAKE 1 TABLET(10 MG) BY MOUTH TWICE DAILY 60 tablet 2  . ipratropium (ATROVENT) 0.03 % nasal spray USE 2 SPRAYS IN EACH NOSTRIL TWICE DAILY 30 mL 6  . levothyroxine (SYNTHROID) 50 MCG tablet TAKE 1 TABLET(50 MCG) BY MOUTH DAILY BEFORE BREAKFAST 30 tablet 11  . meclizine (ANTIVERT) 12.5 MG tablet Take 1 tablet (12.5 mg total) by mouth 3 (three) times daily as needed for dizziness. 90 tablet 3  . olopatadine (PATANOL) 0.1 % ophthalmic solution Place 1 drop into both eyes daily as needed for allergies. 5 mL 5  . omeprazole (PRILOSEC) 20 MG capsule Take  1 capsule (20 mg total) by mouth 2 (two) times daily before a meal. 180 capsule 3  . propranolol ER (INDERAL LA) 60 MG 24 hr capsule TAKE 1 CAPSULE(60 MG) BY MOUTH DAILY 90 capsule 1  . Respiratory Therapy Supplies (NEBULIZER) DEVI Use with nebulized albuterol solution as needed for wheezing, cough or shortness of breath 1 each 0  . sodium chloride 1 g tablet Take 2 g by mouth 2 (two) times daily.    Marland Kitchen. amoxicillin-clavulanate (AUGMENTIN) 875-125 MG tablet Take 1 tablet by mouth 2 (two) times daily. 14 tablet 0   No facility-administered medications prior to visit.      Review of Systems  Review of Systems  HENT: Positive for congestion and postnasal drip.   Respiratory: Positive for cough.   Neurological: Positive for dizziness.    Physical Exam  BP 118/60 (BP Location: Left Arm, Cuff Size: Normal)   Pulse (!) 57   Temp (!) 97.3 F (36.3 C) (Oral)   Ht 5\' 2"  (1.575 m)   Wt 159 lb (72.1 kg)   SpO2 100%   BMI 29.08 kg/m  Physical Exam Constitutional:      Appearance: Normal appearance.  Cardiovascular:     Rate and Rhythm: Normal rate and regular rhythm.  Pulmonary:     Comments: Diminished d/t poor effort; otherwise lungs clear Musculoskeletal:     Comments: In WC; ambulates with walker  Neurological:     General: No focal deficit present.     Mental  Status: She is alert and oriented to person, place, and time. Mental status is at baseline.  Psychiatric:        Mood and Affect: Mood normal.        Behavior: Behavior normal.        Thought Content: Thought content normal.        Judgment: Judgment normal.      Lab Results:  CBC    Component Value Date/Time   WBC 8.1 02/09/2020 1547   WBC 10.5 01/07/2019 0652   RBC 3.69 (L) 02/09/2020 1547   RBC 3.12 (L) 01/07/2019 0652   HGB 10.3 (L) 02/09/2020 1547   HCT 32.7 (L) 02/09/2020 1547   PLT 291 02/09/2020 1547   MCV 89 02/09/2020 1547   MCH 27.9 02/09/2020 1547   MCH 28.8 01/07/2019 0652   MCHC 31.5 02/09/2020 1547   MCHC 29.7 (L) 01/07/2019 0652   RDW 12.8 02/09/2020 1547   LYMPHSABS 3.4 (H) 05/04/2019 1511   MONOABS 0.8 01/07/2019 0652   EOSABS 0.2 05/04/2019 1511   BASOSABS 0.0 05/04/2019 1511    BMET    Component Value Date/Time   NA 121 (L) 02/24/2020 1452   K 4.8 02/24/2020 1452   CL 77 (L) 02/24/2020 1452   CO2 30 (H) 02/24/2020 1452   GLUCOSE 98 02/24/2020 1452   GLUCOSE 87 01/07/2019 0652   BUN 21 02/24/2020 1452   CREATININE 0.68 02/24/2020 1452   CALCIUM 10.3 02/24/2020 1452   GFRNONAA 76 02/24/2020 1452   GFRAA 87 02/24/2020 1452    BNP    Component Value Date/Time   BNP 175.8 (H) 12/27/2018 1834    ProBNP No results found for: PROBNP  Imaging: No results found.   Assessment & Plan:   Sinobronchitis - Reports significant sinusitis/PND symptoms with associated NP cough - Continue Saline nasal spray twice daily and Flonase nasal spray once daily - Adding Astelin nasal spray twice daily  - Stop Atrovent (ipratopium) nasal spray  -  Continue flutter valve three times a day - Sending in RX for Augmentin 1 tab twice a day x 7 days (if needs additional 3 days may send in additional) - will check BMET d.t hx hyponatremia and on Declomycin  - Refer to ENT for chronic sinusitis   Chronic respiratory failure with hypoxia (HCC) - Patient had  pulmonary HTN, on chronic oxygen. Here today for re-qualifying walk. Ambulatory O2 desaturated to 88% on RA; needs 2L to keep O2 >90%  - DME order to renew oxygen @2L  continuous with [employer] sent   OSA on CPAP - Patient on CPAP, reports compliance. She is not currently being managed by our office - Refer to sleep medicine - Asked son to bring in SD card at next visit - DME is [employer]    , NP 06/01/2020

## 2020-05-31 NOTE — Patient Instructions (Addendum)
Recommend: Continue Saline nasal spray twice daily Adding Astelin nasal spray twice daily  Stop Atrovent (ipratopium) nasal spray  Continue Flonase nasal spray once daily Continue flutter valve three times a day  Orders: Check ambulatory O2 saturation today on room air, if <88% RA place on oxygen and titrate L  BMET today  Rx: Augmentin 1 tab twice a day x 7 days (if needs additional 3 days may send in additional)  Referral: ENT re: chronic sinusitis  Sleep medicine doctor - new patient OSA/CPAP

## 2020-06-01 ENCOUNTER — Encounter: Payer: Self-pay | Admitting: Primary Care

## 2020-06-01 DIAGNOSIS — J329 Chronic sinusitis, unspecified: Secondary | ICD-10-CM | POA: Insufficient documentation

## 2020-06-01 DIAGNOSIS — Z9989 Dependence on other enabling machines and devices: Secondary | ICD-10-CM | POA: Insufficient documentation

## 2020-06-01 DIAGNOSIS — J0191 Acute recurrent sinusitis, unspecified: Secondary | ICD-10-CM | POA: Insufficient documentation

## 2020-06-01 LAB — BASIC METABOLIC PANEL
BUN: 13 mg/dL (ref 6–23)
CO2: 42 mEq/L — ABNORMAL HIGH (ref 19–32)
Calcium: 9.9 mg/dL (ref 8.4–10.5)
Chloride: 86 mEq/L — ABNORMAL LOW (ref 96–112)
Creatinine, Ser: 0.7 mg/dL (ref 0.40–1.20)
GFR: 94.32 mL/min (ref >60.00)
Glucose, Bld: 84 mg/dL (ref 70–99)
Potassium: 4.7 mEq/L (ref 3.5–5.1)
Sodium: 130 mEq/L — ABNORMAL LOW (ref 135–145)

## 2020-06-01 NOTE — Assessment & Plan Note (Addendum)
-   Reports significant sinusitis/PND symptoms with associated NP cough - Continue Saline nasal spray twice daily and Flonase nasal spray once daily - Adding Astelin nasal spray twice daily  - Stop Atrovent (ipratopium) nasal spray  - Continue flutter valve three times a day - Sending in RX for Augmentin 1 tab twice a day x 7 days (if needs additional 3 days may send in additional) - will check BMET d.t hx hyponatremia and on Declomycin  - Refer to ENT for chronic sinusitis

## 2020-06-01 NOTE — Assessment & Plan Note (Signed)
-   Patient had pulmonary HTN, on chronic oxygen. Here today for re-qualifying walk. Ambulatory O2 desaturated to 88% on RA; needs 2L to keep O2 >90%  - DME order to renew oxygen @2L  continuous with [employer] sent

## 2020-06-01 NOTE — Assessment & Plan Note (Signed)
-   Patient on CPAP, reports compliance. She is not currently being managed by our office - Refer to sleep medicine - Asked son to bring in SD card at next visit - DME is [employer]

## 2020-06-02 DIAGNOSIS — I5032 Chronic diastolic (congestive) heart failure: Secondary | ICD-10-CM | POA: Diagnosis not present

## 2020-06-02 DIAGNOSIS — M545 Low back pain: Secondary | ICD-10-CM | POA: Diagnosis not present

## 2020-06-02 DIAGNOSIS — R1310 Dysphagia, unspecified: Secondary | ICD-10-CM | POA: Diagnosis not present

## 2020-06-02 DIAGNOSIS — Z9181 History of falling: Secondary | ICD-10-CM | POA: Diagnosis not present

## 2020-06-02 DIAGNOSIS — K219 Gastro-esophageal reflux disease without esophagitis: Secondary | ICD-10-CM | POA: Diagnosis not present

## 2020-06-02 DIAGNOSIS — Z87891 Personal history of nicotine dependence: Secondary | ICD-10-CM | POA: Diagnosis not present

## 2020-06-02 DIAGNOSIS — K760 Fatty (change of) liver, not elsewhere classified: Secondary | ICD-10-CM | POA: Diagnosis not present

## 2020-06-02 DIAGNOSIS — E039 Hypothyroidism, unspecified: Secondary | ICD-10-CM | POA: Diagnosis not present

## 2020-06-02 DIAGNOSIS — I272 Pulmonary hypertension, unspecified: Secondary | ICD-10-CM | POA: Diagnosis not present

## 2020-06-02 DIAGNOSIS — E785 Hyperlipidemia, unspecified: Secondary | ICD-10-CM | POA: Diagnosis not present

## 2020-06-02 DIAGNOSIS — Z9981 Dependence on supplemental oxygen: Secondary | ICD-10-CM | POA: Diagnosis not present

## 2020-06-02 DIAGNOSIS — J9611 Chronic respiratory failure with hypoxia: Secondary | ICD-10-CM | POA: Diagnosis not present

## 2020-06-02 DIAGNOSIS — M7502 Adhesive capsulitis of left shoulder: Secondary | ICD-10-CM | POA: Diagnosis not present

## 2020-06-02 DIAGNOSIS — I69391 Dysphagia following cerebral infarction: Secondary | ICD-10-CM | POA: Diagnosis not present

## 2020-06-02 DIAGNOSIS — I110 Hypertensive heart disease with heart failure: Secondary | ICD-10-CM | POA: Diagnosis not present

## 2020-06-02 DIAGNOSIS — L89316 Pressure-induced deep tissue damage of right buttock: Secondary | ICD-10-CM | POA: Diagnosis not present

## 2020-06-02 DIAGNOSIS — Z7982 Long term (current) use of aspirin: Secondary | ICD-10-CM | POA: Diagnosis not present

## 2020-06-02 DIAGNOSIS — Z87440 Personal history of urinary (tract) infections: Secondary | ICD-10-CM | POA: Diagnosis not present

## 2020-06-02 DIAGNOSIS — I252 Old myocardial infarction: Secondary | ICD-10-CM | POA: Diagnosis not present

## 2020-06-02 DIAGNOSIS — D649 Anemia, unspecified: Secondary | ICD-10-CM | POA: Diagnosis not present

## 2020-06-02 DIAGNOSIS — G8929 Other chronic pain: Secondary | ICD-10-CM | POA: Diagnosis not present

## 2020-06-02 DIAGNOSIS — E222 Syndrome of inappropriate secretion of antidiuretic hormone: Secondary | ICD-10-CM | POA: Diagnosis not present

## 2020-06-02 NOTE — Progress Notes (Signed)
Spoke with pt's son Jonny Ruiz with the pt's verbal permission and notified of results per [facility]. He verbalized understanding.

## 2020-06-02 NOTE — Progress Notes (Signed)
Please let patient know her labs looked improved from 3 months ago. Her Co2 is slightly elevated I would have them use 1.5L oxygen at home continuously and not 2L. Encourage deep breathing exercises and make sure she is using all her inhalers as prescribed

## 2020-06-03 ENCOUNTER — Telehealth: Payer: Self-pay

## 2020-06-03 ENCOUNTER — Ambulatory Visit: Payer: Self-pay | Admitting: *Deleted

## 2020-06-03 ENCOUNTER — Other Ambulatory Visit: Payer: Self-pay | Admitting: *Deleted

## 2020-06-03 NOTE — Patient Outreach (Signed)
[facility]) Care Management  06/03/2020  LESLEA VOWLES November 02, 1924 [id number]    CSW attempted to reach pt's son for follow up and was unable to speak with him. CSW left a voicemail message and will await callback or try again in 3-4 business days.  CSW assisting son with paperwork processing for [facility] personal care for pt.   Reece Levy, MSW, LCSW Clinical Social Worker  [facility] [phone]

## 2020-06-03 NOTE — Telephone Encounter (Signed)
Received request for independent assessment for personal care service attestation of medical need for pt. Given to doctor to sign

## 2020-06-07 ENCOUNTER — Telehealth: Payer: Self-pay | Admitting: Family Medicine

## 2020-06-07 ENCOUNTER — Ambulatory Visit: Payer: Self-pay | Admitting: Family Medicine

## 2020-06-07 NOTE — Telephone Encounter (Signed)
Please advise on message below. Is the medication diflucan  Appropriate to sent to pt?

## 2020-06-07 NOTE — Telephone Encounter (Signed)
pt's son is calling in to ask for diflucan ? Pt was not able to make appt .today ( I did reschedule for pt ) I did not find meds that what her son was asking for  On pts med list   Please call pt's son with any questions

## 2020-06-08 ENCOUNTER — Encounter: Payer: Self-pay | Admitting: Family Medicine

## 2020-06-08 ENCOUNTER — Other Ambulatory Visit: Payer: Self-pay | Admitting: Family Medicine

## 2020-06-08 DIAGNOSIS — H52203 Unspecified astigmatism, bilateral: Secondary | ICD-10-CM | POA: Diagnosis not present

## 2020-06-08 DIAGNOSIS — H35372 Puckering of macula, left eye: Secondary | ICD-10-CM | POA: Diagnosis not present

## 2020-06-08 DIAGNOSIS — H5203 Hypermetropia, bilateral: Secondary | ICD-10-CM | POA: Diagnosis not present

## 2020-06-08 NOTE — Telephone Encounter (Signed)
Requested Prescriptions  Pending Prescriptions Disp Refills   amLODipine (NORVASC) 10 MG tablet [Pharmacy Med Name: AMLODIPINE BESYLATE 10MG  TABLETS] 90 tablet 0    Sig: TAKE 1 TABLET(10 MG) BY MOUTH DAILY     Cardiovascular:  Calcium Channel Blockers Passed - 06/08/2020  3:42 PM      Passed - Last BP in normal range    BP Readings from Last 1 Encounters:  05/31/20 118/60         Passed - Valid encounter within last 6 months    Recent Outpatient Visits          3 months ago Hyperkalemia   [facility] at [facility], [facility], MD   4 months ago Essential hypertension   [facility] at [facility], Oneita Jolly, MD   4 months ago Essential hypertension   [facility] at [facility], Oneita Jolly, MD   4 months ago Essential hypertension   [facility] at [facility], Oneita Jolly, MD   6 months ago Gastroesophageal reflux disease without esophagitis   [facility] at [facility], Oneita Jolly, MD      Future Appointments            In 1 week [place], [facility], PA-C [facility], [facility]   In 2 weeks [place], MD [facility] at [facility], [facility]

## 2020-06-09 ENCOUNTER — Ambulatory Visit: Payer: Self-pay | Admitting: *Deleted

## 2020-06-10 ENCOUNTER — Other Ambulatory Visit: Payer: Self-pay | Admitting: *Deleted

## 2020-06-10 ENCOUNTER — Telehealth: Payer: Self-pay | Admitting: Family Medicine

## 2020-06-10 DIAGNOSIS — M545 Low back pain: Secondary | ICD-10-CM | POA: Diagnosis not present

## 2020-06-10 DIAGNOSIS — K219 Gastro-esophageal reflux disease without esophagitis: Secondary | ICD-10-CM | POA: Diagnosis not present

## 2020-06-10 DIAGNOSIS — L89316 Pressure-induced deep tissue damage of right buttock: Secondary | ICD-10-CM | POA: Diagnosis not present

## 2020-06-10 DIAGNOSIS — R1310 Dysphagia, unspecified: Secondary | ICD-10-CM | POA: Diagnosis not present

## 2020-06-10 DIAGNOSIS — E222 Syndrome of inappropriate secretion of antidiuretic hormone: Secondary | ICD-10-CM | POA: Diagnosis not present

## 2020-06-10 DIAGNOSIS — E785 Hyperlipidemia, unspecified: Secondary | ICD-10-CM | POA: Diagnosis not present

## 2020-06-10 DIAGNOSIS — Z87440 Personal history of urinary (tract) infections: Secondary | ICD-10-CM | POA: Diagnosis not present

## 2020-06-10 DIAGNOSIS — Z87891 Personal history of nicotine dependence: Secondary | ICD-10-CM | POA: Diagnosis not present

## 2020-06-10 DIAGNOSIS — I252 Old myocardial infarction: Secondary | ICD-10-CM | POA: Diagnosis not present

## 2020-06-10 DIAGNOSIS — G8929 Other chronic pain: Secondary | ICD-10-CM | POA: Diagnosis not present

## 2020-06-10 DIAGNOSIS — I5032 Chronic diastolic (congestive) heart failure: Secondary | ICD-10-CM | POA: Diagnosis not present

## 2020-06-10 DIAGNOSIS — J9611 Chronic respiratory failure with hypoxia: Secondary | ICD-10-CM | POA: Diagnosis not present

## 2020-06-10 DIAGNOSIS — Z7982 Long term (current) use of aspirin: Secondary | ICD-10-CM | POA: Diagnosis not present

## 2020-06-10 DIAGNOSIS — E039 Hypothyroidism, unspecified: Secondary | ICD-10-CM | POA: Diagnosis not present

## 2020-06-10 DIAGNOSIS — I272 Pulmonary hypertension, unspecified: Secondary | ICD-10-CM | POA: Diagnosis not present

## 2020-06-10 DIAGNOSIS — Z9181 History of falling: Secondary | ICD-10-CM | POA: Diagnosis not present

## 2020-06-10 DIAGNOSIS — I110 Hypertensive heart disease with heart failure: Secondary | ICD-10-CM | POA: Diagnosis not present

## 2020-06-10 DIAGNOSIS — D649 Anemia, unspecified: Secondary | ICD-10-CM | POA: Diagnosis not present

## 2020-06-10 DIAGNOSIS — Z9981 Dependence on supplemental oxygen: Secondary | ICD-10-CM | POA: Diagnosis not present

## 2020-06-10 DIAGNOSIS — M7502 Adhesive capsulitis of left shoulder: Secondary | ICD-10-CM | POA: Diagnosis not present

## 2020-06-10 DIAGNOSIS — I69391 Dysphagia following cerebral infarction: Secondary | ICD-10-CM | POA: Diagnosis not present

## 2020-06-10 DIAGNOSIS — K760 Fatty (change of) liver, not elsewhere classified: Secondary | ICD-10-CM | POA: Diagnosis not present

## 2020-06-10 MED ORDER — FLUCONAZOLE 150 MG PO TABS
150.0000 mg | ORAL_TABLET | Freq: Once | ORAL | 0 refills | Status: AC
Start: 2020-06-10 — End: 2020-06-10

## 2020-06-10 NOTE — Patient Outreach (Signed)
[facility]) Care Management  06/10/2020  Tammy Fernandez Oct 03, 1924 [id number]   CSW attempted to reach pt/family by phone on 7/8/2021and was unable to reach.  CSW left a HIPPA compliant voice message and will attempt outreach call again; per policy, in 3-4 business days if no return call is received.  CSW will send pt an Unsuccessful Outreach Letter as well.   Reece Levy, MSW, LCSW Clinical Social Worker  [facility] [employer] [phone]

## 2020-06-10 NOTE — Telephone Encounter (Signed)
Is there anything else you would like for me to tell the patient beside to keep monitoring her BP?

## 2020-06-10 NOTE — Telephone Encounter (Signed)
Enrique Sack, from [facility], called regarding the pts BP which was 160/ 70. She states that it has been the highest it has been in a while and that the pt did take BP medication this morning. Please advise.    [phone]

## 2020-06-11 NOTE — Telephone Encounter (Signed)
Keep monitoring BP and we can address further at her next appt with me on the 26th of this month. thanks

## 2020-06-13 ENCOUNTER — Telehealth: Payer: Self-pay | Admitting: *Deleted

## 2020-06-13 NOTE — Telephone Encounter (Signed)
LMTCB pt did not answer 

## 2020-06-13 NOTE — Telephone Encounter (Signed)
Faxed independent assessment form

## 2020-06-13 NOTE — Telephone Encounter (Signed)
Pts son called back. Read messages below. Pts son showed understanding.

## 2020-06-14 ENCOUNTER — Other Ambulatory Visit: Payer: Self-pay | Admitting: *Deleted

## 2020-06-14 NOTE — Patient Outreach (Signed)
[facility]) Care Management  06/14/2020  [name] 02/08/1924 [id number]   CSW made contact with pt's son by phone who had not completed the [facility] paperwork yet and so CSW assisted. Son plans to take the application to PVP office for her portion/signature and once completed will contact CSW for next steps.   Per son, pt has had some STM issues and they are checking her sodium level. CSW will await son's follow up in regards to paperwork.   Reece Levy, MSW, LCSW Clinical Social Worker  [facility] [employer] [phone]

## 2020-06-17 ENCOUNTER — Other Ambulatory Visit: Payer: Self-pay | Admitting: Family Medicine

## 2020-06-17 ENCOUNTER — Ambulatory Visit (INDEPENDENT_AMBULATORY_CARE_PROVIDER_SITE_OTHER): Payer: Medicare Other | Admitting: Cardiology

## 2020-06-17 ENCOUNTER — Other Ambulatory Visit: Payer: Self-pay

## 2020-06-17 ENCOUNTER — Encounter: Payer: Self-pay | Admitting: Cardiology

## 2020-06-17 VITALS — BP 130/60 | HR 64 | Ht 62.0 in | Wt 158.0 lb

## 2020-06-17 DIAGNOSIS — I5032 Chronic diastolic (congestive) heart failure: Secondary | ICD-10-CM

## 2020-06-17 DIAGNOSIS — R413 Other amnesia: Secondary | ICD-10-CM | POA: Diagnosis not present

## 2020-06-17 DIAGNOSIS — I6789 Other cerebrovascular disease: Secondary | ICD-10-CM | POA: Diagnosis not present

## 2020-06-17 DIAGNOSIS — G4733 Obstructive sleep apnea (adult) (pediatric): Secondary | ICD-10-CM

## 2020-06-17 DIAGNOSIS — J441 Chronic obstructive pulmonary disease with (acute) exacerbation: Secondary | ICD-10-CM | POA: Diagnosis not present

## 2020-06-17 DIAGNOSIS — Z9989 Dependence on other enabling machines and devices: Secondary | ICD-10-CM

## 2020-06-17 DIAGNOSIS — R7881 Bacteremia: Secondary | ICD-10-CM | POA: Diagnosis not present

## 2020-06-17 DIAGNOSIS — E871 Hypo-osmolality and hyponatremia: Secondary | ICD-10-CM

## 2020-06-17 DIAGNOSIS — I10 Essential (primary) hypertension: Secondary | ICD-10-CM

## 2020-06-17 DIAGNOSIS — I214 Non-ST elevation (NSTEMI) myocardial infarction: Secondary | ICD-10-CM

## 2020-06-17 DIAGNOSIS — J9601 Acute respiratory failure with hypoxia: Secondary | ICD-10-CM | POA: Diagnosis not present

## 2020-06-17 LAB — BASIC METABOLIC PANEL
BUN/Creatinine Ratio: 29 — ABNORMAL HIGH (ref 12–28)
BUN: 16 mg/dL (ref 10–36)
CO2: 44 mmol/L (ref 20–29)
Calcium: 9.8 mg/dL (ref 8.7–10.3)
Chloride: 84 mmol/L — ABNORMAL LOW (ref 96–106)
Creatinine, Ser: 0.56 mg/dL — ABNORMAL LOW (ref 0.57–1.00)
GFR calc Af Amer: 93 mL/min/{1.73_m2} (ref >59)
GFR calc non Af Amer: 81 mL/min/{1.73_m2} (ref >59)
Glucose: 104 mg/dL — ABNORMAL HIGH (ref 65–99)
Potassium: 5.3 mmol/L — ABNORMAL HIGH (ref 3.5–5.2)
Sodium: 130 mmol/L — ABNORMAL LOW (ref 134–144)

## 2020-06-17 NOTE — Patient Instructions (Signed)
Medication Instructions:  Your physician recommends that you continue on your current medications as directed. Please refer to the Current Medication list given to you today.  *If you need a refill on your cardiac medications before your next appointment, please call your pharmacy*   Lab Work: Your physician recommends that you return for lab work today: STAT BMET  If you have labs (blood work) drawn today and your tests are completely normal, you will receive your results only by: Marland Kitchen MyChart Message (if you have MyChart) OR . A paper copy in the mail If you have any lab test that is abnormal or we need to change your treatment, we will call you to review the results.   Follow-Up: At [facility], you and your health needs are our priority.  As part of our continuing mission to provide you with exceptional heart care, we have created designated Provider Care Teams.  These Care Teams include your primary Cardiologist (physician) and Advanced Practice Providers (APPs -  Physician Assistants and Nurse Practitioners) who all work together to provide you with the care you need, when you need it.  We recommend signing up for the patient portal called "MyChart".  Sign up information is provided on this After Visit Summary.  MyChart is used to connect with patients for Virtual Visits (Telemedicine).  Patients are able to view lab/test results, encounter notes, upcoming appointments, etc.  Non-urgent messages can be sent to your provider as well.   To learn more about what you can do with MyChart, go to [email or web address].    Your next appointment:   6 month(s)  The format for your next appointment:   In Person  Provider:   You may see Nanetta Batty, MD or one of the following Advanced Practice Providers on your designated Care Team:    Corine Shelter, PA-C  [place], [place]  Edd Fabian, [place]    Other Instructions Please call our office 2 months in advance to schedule your  follow-up appointment with Dr. Allyson Sabal.

## 2020-06-17 NOTE — Progress Notes (Addendum)
Cardiology Office Note:    Date:  06/17/2020   ID:  Tammy Fernandez, DOB 11-25-1924, MRN [id number]  PCP:  Myles LippsSantiago, Irma M, MD  Cardiologist:  Nanetta BattyJonathan Berry, MD  Electrophysiologist:  None   Referring MD: Myles LippsSantiago, Irma M, MD   No chief complaint on file.   History of Present Illness:    Tammy [name] is a delightful 86 y.o. female with a hx  of DCHF, Pulmonary HTN, chronic respiratory failure on O2 and C-pap, and chronic hyponatremia.  In Jan 2019 she presented with respiratory failure failure felt to be secondary to diastolic CHF and pulmonary HTN. She was also markedly hyponatremic then. Echo Jan 2019 showed EF 60-65% with grade 2 DD and significant pulmonary HTN with a PA pressure 72 mmHg.She is on chronic O2. She is followed by [facility]. Her mobility is limited, mainly wheel chair but she does do some walking with a cane.  She was admitted through the ED 12/27/2018-to 01/08/2019 with lethargy and confusion.  She was markedly hyponatremic and hypertensive.  She apparently had not been eating well at home and not taking her medications.  She had a complicated hospitalization.  She was seen by the nephrology service, the GI service, the neurology service, and CCM.  Her course was complicated by hypercarbia, aspiration, and hospital-acquired pneumonia.  She was seen by palliative care.  The patient and her family do not wish her to be a DNR.  Initially the plans were to discharge her to a skilled nursing facility but the family wanted to take her home.   She actually did quite well at home.  Her son [name] cares for her.  She is in the office today for routine check.  Apparently the [facility] RN noted some lethargy and was concerned about recurrent hyponatremia.  Recent BMP looked OK- Na+ 130 but I will repeat this today.    She is alert and talkative in the office today.  I encouraged her to consider the COVID vaccine.  She wanted to make sure "may heart can take it".  I reassured that  her heart would be OK, but her heart probably can't take her getting COVID.   Past Medical History:  Diagnosis Date  . Anxiety   . Chronic lower back pain   . Constipation    h/o fecal disimpaction on 2017  . CVA (cerebral vascular accident) (HCC) 2015   neg workup, MRI small acute lacunar infarcts. ASA only  . Depression   . Diastolic CHF, chronic (HCC)    grade I, most recent echo Jan 2019  . Fatty liver   . GERD (gastroesophageal reflux disease)   . Hyperlipidemia   . Hypertension   . Hyponatremia    multiple episodes with encephalopathy, renal thinks SIADH  . Hypothyroidism, unspecified   . Internal hemorrhoid   . NSTEMI (non-ST elevated myocardial infarction) (HCC) 08/28/2016  . Palpitations   . PFO with atrial septal aneurysm    TTE 2015  . Pneumonia 2016  . Severe pulmonary arterial systolic hypertension (HCC) 12/25/2017   per echo, on 1L oxgen via [place]    Past Surgical History:  Procedure Laterality Date  . ABDOMINAL HYSTERECTOMY    . CATARACT EXTRACTION W/ INTRAOCULAR LENS  IMPLANT, BILATERAL Bilateral   . CESAREAN SECTION    . TEE WITHOUT CARDIOVERSION N/A 01/19/2014   Procedure: TRANSESOPHAGEAL ECHOCARDIOGRAM (TEE);  Surgeon: Laurey Moralealton S McLean, MD;  [facility];  Service: Cardiovascular;  Laterality: N/A;  [name]/ja  .  VAGINAL HYSTERECTOMY      Current Medications: Current Meds  Medication Sig  . albuterol (PROVENTIL) (2.5 MG/3ML) 0.083% nebulizer solution USE 3 ML VIA NEBULIZER EVERY 4 HOURS AS NEEDED FOR WHEEZING OR SHORTNESS OF BREATH  . ALPRAZolam (XANAX) 1 MG tablet TAKE 1 TABLET(1 MG) BY MOUTH TWICE DAILY AS NEEDED FOR ANXIETY  . amLODipine (NORVASC) 10 MG tablet TAKE 1 TABLET(10 MG) BY MOUTH DAILY  . Aspirin 81 MG EC tablet Take 81 mg by mouth daily.  Marland Kitchen azelastine (ASTELIN) 0.1 % nasal spray Place 1 spray into both nostrils 2 (two) times daily. Use in each nostril as directed  . cetirizine (ZYRTEC) 10 MG tablet Take 10 mg by mouth daily.  .  clobetasol ointment (TEMOVATE) 0.05 % Apply to affected area every night for 4 weeks, then every other day for 4 weeks and then twice a week for 4 weeks or until resolution.  . clotrimazole-betamethasone (LOTRISONE) cream APPLY TO THE AFFECTED AREA AS DIRECTED  . demeclocycline (DECLOMYCIN) 300 MG tablet TAKE 1/2 TABLET BY MOUTH TWICE DAILY  . diclofenac sodium (VOLTAREN) 1 % GEL Apply 2 g topically 4 (four) times daily.  . fluconazole (DIFLUCAN) 150 MG tablet Take 150 mg by mouth once.  . fluticasone (FLONASE) 50 MCG/ACT nasal spray SHAKE LIQUID AND USE 1 SPRAY IN EACH NOSTRIL TWICE DAILY  . glycerin adult 2 g suppository Place 1 suppository rectally as needed for constipation.  Marland Kitchen ipratropium (ATROVENT) 0.03 % nasal spray USE 2 SPRAYS IN EACH NOSTRIL TWICE DAILY  . levothyroxine (SYNTHROID) 50 MCG tablet TAKE 1 TABLET(50 MCG) BY MOUTH DAILY BEFORE BREAKFAST  . meclizine (ANTIVERT) 12.5 MG tablet Take 1 tablet (12.5 mg total) by mouth 3 (three) times daily as needed for dizziness.  Marland Kitchen olopatadine (PATANOL) 0.1 % ophthalmic solution Place 1 drop into both eyes daily as needed for allergies.  Marland Kitchen omeprazole (PRILOSEC) 20 MG capsule Take 1 capsule (20 mg total) by mouth 2 (two) times daily before a meal.  . propranolol ER (INDERAL LA) 60 MG 24 hr capsule TAKE 1 CAPSULE(60 MG) BY MOUTH DAILY  . Respiratory Therapy Supplies (NEBULIZER) DEVI Use with nebulized albuterol solution as needed for wheezing, cough or shortness of breath  . sodium chloride 1 g tablet Take 2 g by mouth 2 (two) times daily.     Allergies:   Crestor [rosuvastatin calcium], Keflex [cephalexin], Peanut butter flavor, Sulfa antibiotics, Cephalexin, and Sulfa drugs cross reactors   Social History   Socioeconomic History  . Marital status: Widowed    Spouse name: Not on file  . Number of children: 7  . Years of education: Not on file  . Highest education level: Not on file  Occupational History  . Not on file  Tobacco Use  .  Smoking status: Never Smoker  . Smokeless tobacco: Former Neurosurgeon    Types: Snuff  . Tobacco comment: "used snuff when I was 19"  Vaping Use  . Vaping Use: Never used  Substance and Sexual Activity  . Alcohol use: No  . Drug use: No  . Sexual activity: Not Currently  Other Topics Concern  . Not on file  Social History Narrative   ** Merged History Encounter **       Patient lives at home with her son         Social Determinants of Health   Financial Resource Strain:   . Difficulty of Paying Living Expenses:   Food Insecurity: No Food Insecurity  . Worried  About Running Out of Food in the Last Year: Never true  . Ran Out of Food in the Last Year: Never true  Transportation Needs: No Transportation Needs  . Lack of Transportation (Medical): No  . Lack of Transportation (Non-Medical): No  Physical Activity:   . Days of Exercise per Week:   . Minutes of Exercise per Session:   Stress:   . Feeling of Stress :   Social Connections:   . Frequency of Communication with Friends and Family:   . Frequency of Social Gatherings with Friends and Family:   . Attends Religious Services:   . Active Member of Clubs or Organizations:   . Attends Banker Meetings:   Marland Kitchen Marital Status:      Family History: The patient's family history includes Colon cancer in her cousin and another family member; Diabetes in an other family member; Healthy in her daughter and son; Heart disease in an other family member; Hypertension in her father; Stroke in her father.  ROS:   Please see the history of present illness.     All other systems reviewed and are negative.  EKGs/Labs/Other Studies Reviewed:    The following studies were reviewed today: Echo /18/2019- Study Conclusions   - Left ventricle: The cavity size was normal. There was moderate  focal basal hypertrophy. Systolic function was normal. The  estimated ejection fraction was in the range of 60% to 65%. Wall  motion was  normal; there were no regional wall motion  abnormalities. Features are consistent with a pseudonormal left  ventricular filling pattern, with concomitant abnormal relaxation  and increased filling pressure (grade 2 diastolic dysfunction).  Doppler parameters are consistent with high ventricular filling  pressure.  - Aortic valve: There was mild regurgitation.  - Right ventricle: The cavity size was mildly dilated. Wall  thickness was normal. Systolic function was moderately reduced.  - Right atrium: The atrium was mildly dilated.  - Tricuspid valve: There was moderate regurgitation.  - Pulmonary arteries: PA peak pressure: 72 mm Hg (S).  - Impressions: The right ventricular systolic pressure was  increased consistent with severe pulmonary hypertension.   EKG:  EKG is ordered today.  The ekg ordered  demonstrates NSR- HR 64  Recent Labs: 11/05/2019: ALT 10; TSH 1.070 02/09/2020: Hemoglobin 10.3; Platelets 291 05/31/2020: BUN 13; Creatinine, Ser 0.70; Potassium 4.7; Sodium 130  Recent Lipid Panel    Component Value Date/Time   CHOL 181 08/29/2016 0434   TRIG 98 08/29/2016 0434   HDL 37 (L) 08/29/2016 0434   CHOLHDL 4.9 08/29/2016 0434   VLDL 20 08/29/2016 0434   LDLCALC 124 (H) 08/29/2016 0434    Physical Exam:    VS:  BP 130/60   Pulse 64   Ht 5\' 2"  (1.575 m) Comment: per pt  Wt 158 lb (71.7 kg) Comment: per son, pt in wheelchair  SpO2 100%   BMI 28.90 kg/m     Wt Readings from Last 3 Encounters:  06/17/20 158 lb (71.7 kg)  05/31/20 159 lb (72.1 kg)  01/28/20 162 lb (73.5 kg)     GEN: Elderly overweight AA female presents to the office in a wheelchair, in no acute distress HEENT: Normal NECK: No JVD CARDIAC: RRR, no murmurs, rubs, gallops RESPIRATORY:  Clear to auscultation without rales, wheezing or rhonchi -significant kyphosis ABDOMEN:  non-distended MUSCULOSKELETAL:  No edema; No deformity  SKIN: Warm and dry NEUROLOGIC:  Alert and oriented x  3 PSYCHIATRIC:  Normal affect  ASSESSMENT:    Chronic respiratory failure with hypoxia (HCC) Followed by Dr Marchelle Gearing- pt has severe pulmonary HTN and is on chronic O2.  History of cest pain, atypical Low risk Myoview 2014 (Dr Sharyn Lull) . Not felt to be a candidate for further testing when admitted in Sept 2017 (Dr Elease Hashimoto).  Chronic diastolic CHF (congestive heart failure) (HCC) Echo Jan 2019 - EF 60-65% with grade 2 DD and severe pulmonary HTN (PA 72 mmHg)  History of hyponatremia Jan 2019 and again Jan 2020. She has been on chronic Demeclocycline   History of CVA (cerebrovascular accident) H/O CVA 2015  Severe pulmonary arterial systolic hypertension (HCC) PA pressure 72 mmHg   PLAN:    Check STAT BMP for hyponatremia based on reports of recent lethargy by [facility] RN (though she appears alert today).  No change in Rx.  I cautioned her son to avoid using too much O2 as CO2 retention would also cause lethargy. She says she will get the COVID vaccine.  F/U with Dr Allyson Sabal in 6 months.    Medication Adjustments/Labs and Tests Ordered: Current medicines are reviewed at length with the patient today.  Concerns regarding medicines are outlined above.  No orders of the defined types were placed in this encounter.  No orders of the defined types were placed in this encounter.   There are no Patient Instructions on file for this visit.   Jolene Provost, PA-C  06/17/2020 12:48 PM    [facility]

## 2020-06-17 NOTE — Telephone Encounter (Signed)
Requested medication (s) are due for refill today: yes  Requested medication (s) are on the active medication list: yes  Last refill:  03/24/20 #60 2 refills  Future visit scheduled: yes in 1 week  Notes to clinic:  not delegated per protocol     Requested Prescriptions  Pending Prescriptions Disp Refills   ALPRAZolam (XANAX) 1 MG tablet [Pharmacy Med Name: ALPRAZOLAM 1MG  TABLETS] 60 tablet     Sig: TAKE 1 TABLET(1 MG) BY MOUTH TWICE DAILY AS NEEDED FOR ANXIETY      Not Delegated - Psychiatry:  Anxiolytics/Hypnotics Failed - 06/17/2020  6:42 PM      Failed - This refill cannot be delegated      Failed - Urine Drug Screen completed in last 360 days.      Passed - Valid encounter within last 6 months    Recent Outpatient Visits           3 months ago Hyperkalemia   Primary Care at [facility], [facility], MD   4 months ago Essential hypertension   [facility] at [facility], [facility], MD   4 months ago Essential hypertension   [facility] at [facility], [facility], MD   5 months ago Essential hypertension   [facility] at [facility], [facility], MD   6 months ago Gastroesophageal reflux disease without esophagitis   [facility] at [facility], [facility], MD       Future Appointments             In 1 week [facility], MD Primary Care at [place], [facility]

## 2020-06-19 ENCOUNTER — Other Ambulatory Visit: Payer: Self-pay | Admitting: Family Medicine

## 2020-06-19 NOTE — Telephone Encounter (Signed)
Requested Prescriptions  Pending Prescriptions Disp Refills  . albuterol (PROVENTIL) (2.5 MG/3ML) 0.083% nebulizer solution [Pharmacy Med Name: ALBUTEROL 0.083%(2.5MG /3ML) 25X3ML] 75 mL 0    Sig: USE 3 ML VIA NEBULIZER EVERY 4 HOURS AS NEEDED FOR WHEEZING OR SHORTNESS OF BREATH     Pulmonology:  Beta Agonists Failed - 06/19/2020 10:08 AM      Failed - One inhaler should last at least one month. If the patient is requesting refills earlier, contact the patient to check for uncontrolled symptoms.      Passed - Valid encounter within last 12 months    Recent Outpatient Visits          3 months ago Hyperkalemia   Primary Care at [facility], [facility], MD   4 months ago Essential hypertension   [facility] at [facility], [facility], MD   4 months ago Essential hypertension   [facility] at [facility], [facility], MD   5 months ago Essential hypertension   [facility] at [facility], [facility], MD   6 months ago Gastroesophageal reflux disease without esophagitis   [facility] at [facility], [facility], MD      Future Appointments            In 1 week Myles Lipps, MD Primary Care at [place], [facility]

## 2020-06-20 ENCOUNTER — Telehealth: Payer: Self-pay | Admitting: Family Medicine

## 2020-06-20 MED ORDER — OMEPRAZOLE 20 MG PO CPDR: DELAYED_RELEASE_CAPSULE | ORAL | 1 refills | Status: DC

## 2020-06-20 NOTE — Telephone Encounter (Signed)
Amyiah Gaba (son) checking on the status of request mentioned below. Informed please allow 48 to 72 hour turn around time, son states patient is completely out.

## 2020-06-20 NOTE — Telephone Encounter (Signed)
pmp reviewd, appropriate meds refilled 

## 2020-06-20 NOTE — Telephone Encounter (Signed)
Mare Ferrari (son) requesting omeprazole (PRILOSEC) 20 MG capsule and states the frequecy should be 1 in the am and 2 in the pm and would like new rx to reflect.     [id number] [place] Otto, [place] - [address] AT [facility] OF [address] Phone:  [phone]  Fax:  [phone]

## 2020-06-20 NOTE — Telephone Encounter (Signed)
Patient's son is requesting omeprazole 20 mg to be resent in to reflect taking 1 in am and 2 in pm. Please advice

## 2020-06-27 ENCOUNTER — Other Ambulatory Visit: Payer: Self-pay

## 2020-06-27 ENCOUNTER — Ambulatory Visit (INDEPENDENT_AMBULATORY_CARE_PROVIDER_SITE_OTHER): Payer: Medicare Other | Admitting: Family Medicine

## 2020-06-27 ENCOUNTER — Encounter: Payer: Self-pay | Admitting: Family Medicine

## 2020-06-27 VITALS — BP 132/60 | HR 67 | Temp 98.1°F | Ht 62.0 in

## 2020-06-27 DIAGNOSIS — E222 Syndrome of inappropriate secretion of antidiuretic hormone: Secondary | ICD-10-CM

## 2020-06-27 DIAGNOSIS — E039 Hypothyroidism, unspecified: Secondary | ICD-10-CM | POA: Diagnosis not present

## 2020-06-27 DIAGNOSIS — Z9989 Dependence on other enabling machines and devices: Secondary | ICD-10-CM

## 2020-06-27 DIAGNOSIS — J9611 Chronic respiratory failure with hypoxia: Secondary | ICD-10-CM | POA: Diagnosis not present

## 2020-06-27 DIAGNOSIS — G4733 Obstructive sleep apnea (adult) (pediatric): Secondary | ICD-10-CM | POA: Diagnosis not present

## 2020-06-27 DIAGNOSIS — H6121 Impacted cerumen, right ear: Secondary | ICD-10-CM

## 2020-06-27 DIAGNOSIS — I10 Essential (primary) hypertension: Secondary | ICD-10-CM

## 2020-06-27 NOTE — Progress Notes (Signed)
7/26/20213:18 PM  Tammy Fernandez 23-Aug-1924, 86 y.o., female [id number]  Chief Complaint  Patient presents with  . Medical Management of Chronic Issues    f/u     HPI:   Patient is a 86 y.o. female with past medical history significant for hyponatremia2/2 SIADH, constipation, HTN,dCHF,severepHTN, chronic respiratory failureon oxygen, CVA, GERD and seasonal allergies who presents today for routine followup  Last OV march 2021 Saw card July 2021 - no changes Saw pulm June 2021- decreased O2 to 1.5L, added astelin, stopped atrovent, referred to ENT  She is overall doing well She comes in with her son Will bring in forms to have patient evaluated for in home assistance for her ADLs and IADLs, Very happy with remote health Her BP at home has been doing well She feels that she might have earwax in her ears She has no other acute concerns today  Depression screen [facility] 2/9 06/27/2020 02/09/2020 01/28/2020  Decreased Interest 0 0 0  Down, Depressed, Hopeless 0 0 0  PHQ - 2 Score 0 0 0  Altered sleeping - - -  Tired, decreased energy - - -  Change in appetite - - -  Feeling bad or failure about yourself  - - -  Trouble concentrating - - -  Moving slowly or fidgety/restless - - -  Suicidal thoughts - - -  PHQ-9 Score - - -  Difficult doing work/chores - - -  Some recent data might be hidden    Fall Risk  06/27/2020 02/22/2020 02/09/2020 01/28/2020 12/10/2019  Falls in the past year? 0 0 0 0 0  Number falls in past yr: 0 0 0 0 0  Injury with Fall? 0 0 0 0 0  Risk for fall due to : - Impaired balance/gait;Impaired mobility - - -  Risk for fall due to: Comment - - - - -  Follow up Falls evaluation completed Falls evaluation completed;Falls prevention discussed Falls evaluation completed Falls evaluation completed -     Allergies  Allergen Reactions  . Crestor [Rosuvastatin Calcium] Other (See Comments)    Muscle Aches  . Keflex [Cephalexin] Other (See Comments)    dizziness    . Peanut Butter Flavor Hives  . Sulfa Antibiotics Hives  . Cephalexin Other (See Comments)    dizziness  . Sulfa Drugs Cross Reactors Rash    Prior to Admission medications   Medication Sig Start Date End Date Taking? Authorizing Provider  albuterol (PROVENTIL) (2.5 MG/3ML) 0.083% nebulizer solution USE 3 ML VIA NEBULIZER EVERY 4 HOURS AS NEEDED FOR WHEEZING OR SHORTNESS OF BREATH 06/19/20  Yes Myles Lipps, MD  ALPRAZolam Prudy Feeler) 1 MG tablet TAKE 1 TABLET(1 MG) BY MOUTH TWICE DAILY AS NEEDED FOR ANXIETY 06/20/20  Yes Myles Lipps, MD  amLODipine (NORVASC) 10 MG tablet TAKE 1 TABLET(10 MG) BY MOUTH DAILY 06/08/20  Yes Myles Lipps, MD  Aspirin 81 MG EC tablet Take 81 mg by mouth daily.   Yes [provider]  azelastine (ASTELIN) 0.1 % nasal spray Place 1 spray into both nostrils 2 (two) times daily. Use in each nostril as directed 05/31/20  Yes Glenford Bayley, NP  cetirizine (ZYRTEC) 10 MG tablet Take 10 mg by mouth daily.   Yes [provider]  clobetasol ointment (TEMOVATE) 0.05 % Apply to affected area every night for 4 weeks, then every other day for 4 weeks and then twice a week for 4 weeks or until resolution. 02/02/20  Yes Constant, Peggy,  MD  clotrimazole-betamethasone (LOTRISONE) cream APPLY TO THE AFFECTED AREA AS DIRECTED 02/29/20  Yes [provider]  fluticasone (FLONASE) 50 MCG/ACT nasal spray SHAKE LIQUID AND USE 1 SPRAY IN EACH NOSTRIL TWICE DAILY 04/30/20  Yes Myles Lipps, MD  glycerin adult 2 g suppository Place 1 suppository rectally as needed for constipation. 01/27/19  Yes Myles Lipps, MD  ipratropium (ATROVENT) 0.03 % nasal spray USE 2 SPRAYS IN EACH NOSTRIL TWICE DAILY 04/01/20  Yes Collie Siad A, MD  levothyroxine (SYNTHROID) 50 MCG tablet TAKE 1 TABLET(50 MCG) BY MOUTH DAILY BEFORE BREAKFAST 11/07/19  Yes Myles Lipps, MD  meclizine (ANTIVERT) 12.5 MG tablet Take 1 tablet (12.5 mg total) by mouth 3 (three) times daily as  needed for dizziness. 01/12/20  Yes Myles Lipps, MD  olopatadine (PATANOL) 0.1 % ophthalmic solution Place 1 drop into both eyes daily as needed for allergies. 01/28/20  Yes Myles Lipps, MD  omeprazole (PRILOSEC) 20 MG capsule Take 1 capsule (20 mg total) by mouth daily AND 2 capsules (40 mg total) at bedtime. 06/20/20 09/18/20 Yes Myles Lipps, MD  propranolol ER (INDERAL LA) 60 MG 24 hr capsule TAKE 1 CAPSULE(60 MG) BY MOUTH DAILY 02/16/20  Yes Myles Lipps, MD  Respiratory Therapy Supplies (NEBULIZER) DEVI Use with nebulized albuterol solution as needed for wheezing, cough or shortness of breath 04/13/20  Yes Myles Lipps, MD  sodium chloride 1 g tablet Take 2 g by mouth 2 (two) times daily. 05/24/20  Yes [provider]  demeclocycline (DECLOMYCIN) 300 MG tablet TAKE 1/2 TABLET BY MOUTH TWICE DAILY 10/13/19   Myles Lipps, MD  diclofenac sodium (VOLTAREN) 1 % GEL Apply 2 g topically 4 (four) times daily. Patient not taking: Reported on 06/27/2020 06/02/19   Doristine Bosworth, MD    Past Medical History:  Diagnosis Date  . Anxiety   . Chronic lower back pain   . Constipation    h/o fecal disimpaction on 2017  . CVA (cerebral vascular accident) (HCC) 2015   neg workup, MRI small acute lacunar infarcts. ASA only  . Depression   . Diastolic CHF, chronic (HCC)    grade I, most recent echo Jan 2019  . Fatty liver   . GERD (gastroesophageal reflux disease)   . Hyperlipidemia   . Hypertension   . Hyponatremia    multiple episodes with encephalopathy, renal thinks SIADH  . Hypothyroidism, unspecified   . Internal hemorrhoid   . NSTEMI (non-ST elevated myocardial infarction) (HCC) 08/28/2016  . Palpitations   . PFO with atrial septal aneurysm    TTE 2015  . Pneumonia 2016  . Severe pulmonary arterial systolic hypertension (HCC) 12/25/2017   per echo, on 1L oxgen via [place]    Past Surgical History:  Procedure Laterality Date  . ABDOMINAL HYSTERECTOMY    .  CATARACT EXTRACTION W/ INTRAOCULAR LENS  IMPLANT, BILATERAL Bilateral   . CESAREAN SECTION    . TEE WITHOUT CARDIOVERSION N/A 01/19/2014   Procedure: TRANSESOPHAGEAL ECHOCARDIOGRAM (TEE);  Surgeon: Laurey Morale, MD;  Location: [facility] ENDOSCOPY;  Service: Cardiovascular;  Laterality: N/A;  [name]  . VAGINAL HYSTERECTOMY      Social History   Tobacco Use  . Smoking status: Never Smoker  . Smokeless tobacco: Former Neurosurgeon    Types: Snuff  . Tobacco comment: "used snuff when I was 19"  Substance Use Topics  . Alcohol use: No    Family History  Problem Relation Age  of Onset  . Stroke Father   . Hypertension Father   . Colon cancer Cousin   . Colon cancer Other        Aunt  . Diabetes Other        aunt and cousins  . Heart disease Other        cousins  . Healthy Daughter   . Healthy Son     Review of Systems  Constitutional: Negative for chills and fever.  Respiratory: Negative for cough and shortness of breath.   Cardiovascular: Negative for chest pain, palpitations and leg swelling.  Gastrointestinal: Positive for nausea. Negative for abdominal pain and vomiting.     OBJECTIVE:  Today's Vitals   06/27/20 1511 06/27/20 1529  BP: (!) 165/59 (!) 132/60  Pulse: 67   Temp: 98.1 F (36.7 C)   TempSrc: Temporal   SpO2: 100%   Height: 5\' 2"  (1.575 m)    Body mass index is 28.9 kg/m.   BP Readings from Last 3 Encounters:  06/27/20 (!) 165/59  06/17/20 130/60  05/31/20 118/60     Physical Exam Vitals and nursing note reviewed.  Constitutional:      Appearance: She is well-developed.  HENT:     Head: Normocephalic and atraumatic.     Right Ear: Hearing, tympanic membrane, ear canal and external ear normal. There is impacted cerumen.     Left Ear: Hearing, tympanic membrane, ear canal and external ear normal.  Eyes:     Conjunctiva/sclera: Conjunctivae normal.     Pupils: Pupils are equal, round, and reactive to light.  Cardiovascular:     Rate and Rhythm:  Normal rate and regular rhythm.     Heart sounds: Normal heart sounds. No murmur heard.  No friction rub. No gallop.   Pulmonary:     Effort: Pulmonary effort is normal.     Breath sounds: Normal breath sounds. No wheezing, rhonchi or rales.  Musculoskeletal:     Cervical back: Neck supple.     Right lower leg: No edema.     Left lower leg: No edema.  Lymphadenopathy:     Cervical: No cervical adenopathy.  Skin:    General: Skin is warm and dry.  Neurological:     Mental Status: She is alert and oriented to person, place, and time.     No results found for this or any previous visit (from the past 24 hour(s)).  No results found.   ASSESSMENT and PLAN  1. Essential hypertension Controlled. Continue current regime.   2. Hypothyroidism, unspecified type Checking labs today, medications will be adjusted as needed.  - TSH  3. SIADH (syndrome of inappropriate ADH production) (HCC) Sodium has been stable on demeclocycline per renal - Basic Metabolic Panel  4. Chronic respiratory failure with hypoxia (HCC) Managed by pulm  5. OSA on CPAP Will reach out to pulm as neuro sleep advised pulm mgt  6. Right ear impacted cerumen - Ear wax removal - removed successfully by cma wo issues  Other orders - propranolol ER (INDERAL LA) 60 MG 24 hr capsule; TAKE 1 CAPSULE(60 MG) BY MOUTH DAILY  Return in about 3 months (around 09/27/2020).    09/29/2020, MD Primary Care at [facility] [address] [place], [place] Ph.  [phone] Fax [phone]

## 2020-06-27 NOTE — Patient Instructions (Signed)
° ° ° °  If you have lab work done today you will be contacted with your lab results within the next 2 weeks.  If you have not heard from us then please contact us. The fastest way to get your results is to register for My Chart. ° ° °IF you received an x-ray today, you will receive an invoice from [facility]. Please contact [facility] at [phone] with questions or concerns regarding your invoice.  ° °IF you received labwork today, you will receive an invoice from [facility]. Please contact [facility] at [phone] with questions or concerns regarding your invoice.  ° °Our billing staff will not be able to assist you with questions regarding bills from these companies. ° °You will be contacted with the lab results as soon as they are available. The fastest way to get your results is to activate your My Chart account. Instructions are located on the last page of this paperwork. If you have not heard from us regarding the results in 2 weeks, please contact this office. °  ° ° ° °

## 2020-06-28 ENCOUNTER — Encounter: Payer: Self-pay | Admitting: Family Medicine

## 2020-06-28 ENCOUNTER — Other Ambulatory Visit: Payer: Self-pay | Admitting: *Deleted

## 2020-06-28 LAB — BASIC METABOLIC PANEL
BUN/Creatinine Ratio: 22 (ref 12–28)
BUN: 14 mg/dL (ref 10–36)
CO2: 35 mmol/L — ABNORMAL HIGH (ref 20–29)
Calcium: 10.5 mg/dL — ABNORMAL HIGH (ref 8.7–10.3)
Chloride: 85 mmol/L — ABNORMAL LOW (ref 96–106)
Creatinine, Ser: 0.65 mg/dL (ref 0.57–1.00)
GFR calc Af Amer: 89 mL/min/{1.73_m2} (ref >59)
GFR calc non Af Amer: 77 mL/min/{1.73_m2} (ref >59)
Glucose: 84 mg/dL (ref 65–99)
Potassium: 4.8 mmol/L (ref 3.5–5.2)
Sodium: 133 mmol/L — ABNORMAL LOW (ref 134–144)

## 2020-06-28 LAB — TSH: TSH: 1.43 u[IU]/mL (ref 0.450–4.500)

## 2020-06-28 MED ORDER — PROPRANOLOL HCL ER 60 MG PO CP24: ORAL_CAPSULE | ORAL | 1 refills | Status: DC

## 2020-06-28 NOTE — Patient Outreach (Signed)
[facility]) Care Management  06/28/2020  Tammy Fernandez 1924/09/15 [id number]   CSW spoke with pt's son who plans to deliver the paperwork for [facility] services to PCP today for completion.  Per son, he forgot to take it with them when he took pt to see PCP yesterday.   CSW instructed son to have the PCP office fax the forms once completed to the fax# listed on the paperwork.  CSW advised son to call if needs arise going forward.   CSW plan to sign off and will advise PCP and [facility] team of above.   Reece Levy, MSW, LCSW Clinical Social Worker  [facility] [employer] [phone]

## 2020-06-29 ENCOUNTER — Telehealth: Payer: Self-pay | Admitting: Family Medicine

## 2020-06-29 ENCOUNTER — Other Ambulatory Visit: Payer: Self-pay | Admitting: *Deleted

## 2020-06-29 MED ORDER — SODIUM CHLORIDE 1 G PO TABS: 2.0000 g | ORAL_TABLET | Freq: Two times a day (BID) | ORAL | 1 refills | Status: DC

## 2020-06-29 NOTE — Telephone Encounter (Signed)
Pt requesting refill on sodium chloride. Is it ok to refill bc I see in the notes that you stated her sodium was stable.   Please Advise

## 2020-06-29 NOTE — Telephone Encounter (Signed)
Pt son came in and is dropped off request for assessent for person care services. Paper work was left in Chartered loss adjuster beside nurses station.   Pt son also stated she had not received her refill on   sodium chloride 1 g tablet [[id number]]  Please advise.

## 2020-06-29 NOTE — Patient Outreach (Signed)
[facility]) Care Management  06/29/2020  Tammy Fernandez [birth year] [id number]   RN Health Coach telephone call from patient son.  [employer] compliance verified. [name] (son) wanted to know about the homebound vaccination program. RN Health Coach gave [name] the provider names and number to call for vaccinations. The patient son is wanting to get the family vaccinated when they come to do his mother. RN Health Coach told him to tell the providers that the family would like to be vaccinated also. They will let him know if this is a possibility.   Gean Maidens BSN RN [facility] [phone]

## 2020-06-30 ENCOUNTER — Institutional Professional Consult (permissible substitution): Payer: Medicare Other | Admitting: Internal Medicine

## 2020-06-30 DIAGNOSIS — J9601 Acute respiratory failure with hypoxia: Secondary | ICD-10-CM | POA: Diagnosis not present

## 2020-06-30 DIAGNOSIS — R7881 Bacteremia: Secondary | ICD-10-CM | POA: Diagnosis not present

## 2020-06-30 DIAGNOSIS — I6789 Other cerebrovascular disease: Secondary | ICD-10-CM | POA: Diagnosis not present

## 2020-06-30 DIAGNOSIS — B9562 Methicillin resistant Staphylococcus aureus infection as the cause of diseases classified elsewhere: Secondary | ICD-10-CM | POA: Diagnosis not present

## 2020-07-01 ENCOUNTER — Telehealth: Payer: Self-pay

## 2020-07-01 NOTE — Telephone Encounter (Signed)
Request for independent assessment form sent from [facility] [employer] has been given to the doctor for completion. Faxed back to [phone].

## 2020-07-01 NOTE — Telephone Encounter (Signed)
Forms from [facility] Medicaid faxed to [phone]

## 2020-07-06 NOTE — Telephone Encounter (Signed)
Noted  

## 2020-07-06 NOTE — Telephone Encounter (Signed)
[employer] [place] sent in the fax again for paper work to be filled out. Paperwork was left in providers box beside nurses station. Please advise.

## 2020-07-11 ENCOUNTER — Other Ambulatory Visit: Payer: Self-pay | Admitting: Family Medicine

## 2020-07-19 ENCOUNTER — Other Ambulatory Visit: Payer: Self-pay | Admitting: Family Medicine

## 2020-07-19 NOTE — Telephone Encounter (Signed)
Requested Prescriptions  Pending Prescriptions Disp Refills   levothyroxine (SYNTHROID) 50 MCG tablet [Pharmacy Med Name: LEVOTHYROXINE 0.05MG  ( ) TAB] 90 tablet 3    Sig: TAKE 1 TABLET(50 MCG) BY MOUTH DAILY BEFORE BREAKFAST     Endocrinology:  Hypothyroid Agents Failed - 07/19/2020 10:44 AM      Failed - TSH needs to be rechecked within 3 months after an abnormal result. Refill until TSH is due.      Passed - TSH in normal range and within 360 days    TSH  Date Value Ref Range Status  06/27/2020 1.430 0.450 - 4.500 uIU/mL Final         Passed - Valid encounter within last 12 months    Recent Outpatient Visits          3 weeks ago Essential hypertension   [facility], Meda Coffee, MD   4 months ago Hyperkalemia   Primary [facility] at [facility], Manus Rudd, MD   5 months ago Essential hypertension   [facility], Meda Coffee, MD   5 months ago Essential hypertension   [facility], Meda Coffee, MD   6 months ago Essential hypertension   [facility], Meda Coffee, MD      Future Appointments            In 1 week Myles Lipps, MD [facility] [place], [facility]            amLODipine (NORVASC) 10 MG tablet [Pharmacy Med Name: AMLODIPINE BESYLATE 10MG  TABLETS] 90 tablet 1    Sig: TAKE 1 TABLET(10 MG) BY MOUTH DAILY     Cardiovascular:  Calcium Channel Blockers Passed - 07/19/2020 10:44 AM      Passed - Last BP in normal range    BP Readings from Last 1 Encounters:  06/27/20 (!) 132/60         Passed - Valid encounter within last 6 months    Recent Outpatient Visits          3 weeks ago Essential hypertension   [facility] at 06/29/20, [facility], MD   4 months ago Hyperkalemia   [facility] at [facility], [facility], MD   5 months ago Essential hypertension   [facility], [facility], MD   5 months ago Essential hypertension   [facility], [facility], MD   6 months ago  Essential hypertension   [facility], [facility], MD      Future Appointments            In 1 week Meda Coffee, MD Primary [facility] at [place], [facility]

## 2020-07-26 ENCOUNTER — Ambulatory Visit: Payer: Medicare Other | Admitting: Family Medicine

## 2020-07-28 ENCOUNTER — Other Ambulatory Visit: Payer: Self-pay | Admitting: Family Medicine

## 2020-07-29 DIAGNOSIS — E871 Hypo-osmolality and hyponatremia: Secondary | ICD-10-CM | POA: Diagnosis not present

## 2020-07-29 DIAGNOSIS — D72829 Elevated white blood cell count, unspecified: Secondary | ICD-10-CM | POA: Diagnosis not present

## 2020-07-31 DIAGNOSIS — I6789 Other cerebrovascular disease: Secondary | ICD-10-CM | POA: Diagnosis not present

## 2020-07-31 DIAGNOSIS — J9601 Acute respiratory failure with hypoxia: Secondary | ICD-10-CM | POA: Diagnosis not present

## 2020-07-31 DIAGNOSIS — R7881 Bacteremia: Secondary | ICD-10-CM | POA: Diagnosis not present

## 2020-07-31 DIAGNOSIS — B9562 Methicillin resistant Staphylococcus aureus infection as the cause of diseases classified elsewhere: Secondary | ICD-10-CM | POA: Diagnosis not present

## 2020-08-01 ENCOUNTER — Ambulatory Visit: Payer: Medicare Other | Admitting: Internal Medicine

## 2020-08-01 ENCOUNTER — Ambulatory Visit: Payer: Medicare Other | Admitting: Obstetrics and Gynecology

## 2020-08-02 ENCOUNTER — Ambulatory Visit: Payer: Medicare Other | Admitting: Family Medicine

## 2020-08-09 ENCOUNTER — Other Ambulatory Visit: Payer: Self-pay | Admitting: *Deleted

## 2020-08-09 NOTE — Patient Outreach (Signed)
[facility]) Care Management  [facility] Care Manager  08/09/2020   Tammy Fernandez 16-Dec-1923 [id number]  RN Health Coach telephone call to patient son [name] (caregiver).  [employer] compliance verified. Patient and family have received COVID vaccine. RN discussed booster vaccine. Patient is eating well. [name] is trying to get in the CAP program. Patient is using BiPAP machine but [name] is not sure it is programmed correctly. [name] is  going to call the company today and have them to come out and check machine. Per [name] Co 2 levels are elevated. Patient is being weighed weekly. [name] is aware of what to look for zones and action plan of CHF. [name] has agreed to further outreach calls.   Encounter Medications:  Outpatient Encounter Medications as of 08/09/2020  Medication Sig  . albuterol (PROVENTIL) (2.5 MG/3ML) 0.083% nebulizer solution USE 3 ML VIA NEBULIZER EVERY 4 HOURS AS NEEDED FOR WHEEZING OR SHORTNESS OF BREATH  . ALPRAZolam (XANAX) 1 MG tablet TAKE 1 TABLET(1 MG) BY MOUTH TWICE DAILY AS NEEDED FOR ANXIETY  . amLODipine (NORVASC) 10 MG tablet TAKE 1 TABLET(10 MG) BY MOUTH DAILY  . Aspirin 81 MG EC tablet Take 81 mg by mouth daily.  Marland Kitchen azelastine (ASTELIN) 0.1 % nasal spray Place 1 spray into both nostrils 2 (two) times daily. Use in each nostril as directed  . cetirizine (ZYRTEC) 10 MG tablet Take 10 mg by mouth daily.  . clobetasol ointment (TEMOVATE) 0.05 % Apply to affected area every night for 4 weeks, then every other day for 4 weeks and then twice a week for 4 weeks or until resolution.  . clotrimazole-betamethasone (LOTRISONE) cream APPLY TO THE AFFECTED AREA AS DIRECTED  . demeclocycline (DECLOMYCIN) 300 MG tablet TAKE 1/2 TABLET BY MOUTH TWICE DAILY  . diclofenac sodium (VOLTAREN) 1 % GEL Apply 2 g topically 4 (four) times daily. (Patient not taking: Reported on 06/27/2020)  . fluticasone (FLONASE) 50 MCG/ACT nasal spray SHAKE LIQUID AND USE 1 SPRAY IN EACH NOSTRIL  TWICE DAILY  . glycerin adult 2 g suppository Place 1 suppository rectally as needed for constipation.  Marland Kitchen ipratropium (ATROVENT) 0.03 % nasal spray USE 2 SPRAYS IN EACH NOSTRIL TWICE DAILY  . levothyroxine (SYNTHROID) 50 MCG tablet TAKE 1 TABLET(50 MCG) BY MOUTH DAILY BEFORE BREAKFAST  . meclizine (ANTIVERT) 12.5 MG tablet Take 1 tablet (12.5 mg total) by mouth 3 (three) times daily as needed for dizziness.  Marland Kitchen olopatadine (PATANOL) 0.1 % ophthalmic solution Place 1 drop into both eyes daily as needed for allergies.  Marland Kitchen omeprazole (PRILOSEC) 20 MG capsule Take 1 capsule (20 mg total) by mouth daily AND 2 capsules (40 mg total) at bedtime.  . propranolol ER (INDERAL LA) 60 MG 24 hr capsule TAKE 1 CAPSULE(60 MG) BY MOUTH DAILY  . Respiratory Therapy Supplies (NEBULIZER) DEVI Use with nebulized albuterol solution as needed for wheezing, cough or shortness of breath  . sodium chloride 1 g tablet Take 2 tablets (2 g total) by mouth 2 (two) times daily.   No facility-administered encounter medications on file as of 08/09/2020.    Functional Status:  In your present state of health, do you have any difficulty performing the following activities: 09/21/2019  Hearing? N  Vision? N  Difficulty concentrating or making decisions? Y  Comment some short term memory loss  Walking or climbing stairs? Y  Comment uses a walker or wheelchair  Doing errands, shopping? Y  Comment needs Network engineer and eating ? Jeannie Fend  Comment son is caregiver  In the past six months, have you accidently leaked urine? Y  Comment Per son sometimes incont  Do you have problems with loss of bowel control? N  Managing your Medications? Y  Comment son prepares  Managing your Finances? Y  Comment son Database administrator or managing your Housekeeping? Y  Comment lives with son/ son caregiver  Some recent data might be hidden    Fall/Depression Screening: Fall Risk  08/09/2020 06/27/2020 02/22/2020  Falls in the past  year? 0 0 0  Number falls in past yr: 0 0 0  Injury with Fall? 0 0 0  Risk for fall due to : Impaired balance/gait;Impaired mobility - Impaired balance/gait;Impaired mobility  Risk for fall due to: Comment - - -  Follow up Falls evaluation completed Falls evaluation completed Falls evaluation completed;Falls prevention discussed   PHQ 2/9 Scores 06/27/2020 02/09/2020 01/28/2020 01/12/2020 12/10/2019 11/05/2019 10/22/2019  PHQ - 2 Score 0 0 0 0 0 0 0  PHQ- 9 Score - - - - - - -   Goals Addressed            This Visit's Progress   . Caregiver verbalize knowledge of Heart Failure disease self management skills by no CHF exacerbation   On track    CARE PLAN ENTRY (see longtitudinal plan of care for additional care plan information)   Current Barriers:  Marland Kitchen Knowledge deficit related to basic heart failure pathophysiology and self care management  Case Manager Clinical Goal(s):  Marland Kitchen Over the next 90 days, patient will verbalize understanding of Heart Failure Action Plan and when to call doctor . Over the next 90 days, patient will take all Heart Failure mediations as prescribed . Over the next 90 days, patient will weigh daily and record (notifying MD of 3 lb weight gain over night or 5 lb in a week) . Patient will verbalize receiving annual flu vaccine within the next 90 days.   Interventions:  . Provided verbal education on low sodium diet . Provided written education on low sodium diet . Reviewed Heart Failure Action Plan in depth and provided written copy . Assessed need for readable accurate scales in home . Discussed importance of daily weight and advised patient to weigh and record daily . Reviewed role of diuretics in prevention of fluid overload and management of heart failure  Patient Self Care Activities:  . Takes Heart Failure Medications as prescribed . Weighs daily and record (notifying MD of 3 lb weight gain over night or 5 lb in a week) . Verbalizes understanding of and follows  CHF Action Plan . Adheres to low sodium diet . Will follow up with flu vaccine, Covid booster and eye exam   Initial goal documentation         Assessment:  Caregiver will weigh patient weekly Appetite good No recent falls No admissions since last outreach for CHF Received COVID vaccine Plan:  RN discussed COVID booster RN discussed scheduling flu vaccine RN discussed scheduling eye exam Caregiver will monitor weight Caregiver will administer medication as per directed RN will follow up within the month of December  Gean Maidens BSN RN [facility] [phone]

## 2020-08-12 ENCOUNTER — Telehealth: Payer: Self-pay

## 2020-08-12 ENCOUNTER — Other Ambulatory Visit: Payer: Self-pay

## 2020-08-12 ENCOUNTER — Encounter: Payer: Self-pay | Admitting: Family Medicine

## 2020-08-12 ENCOUNTER — Ambulatory Visit (INDEPENDENT_AMBULATORY_CARE_PROVIDER_SITE_OTHER): Payer: Medicare Other | Admitting: Family Medicine

## 2020-08-12 VITALS — BP 151/63 | HR 61 | Temp 98.1°F | Ht 62.0 in | Wt 152.0 lb

## 2020-08-12 DIAGNOSIS — J9611 Chronic respiratory failure with hypoxia: Secondary | ICD-10-CM

## 2020-08-12 DIAGNOSIS — Z23 Encounter for immunization: Secondary | ICD-10-CM | POA: Diagnosis not present

## 2020-08-12 DIAGNOSIS — I10 Essential (primary) hypertension: Secondary | ICD-10-CM | POA: Diagnosis not present

## 2020-08-12 DIAGNOSIS — E039 Hypothyroidism, unspecified: Secondary | ICD-10-CM

## 2020-08-12 MED ORDER — HYDRALAZINE HCL 10 MG PO TABS: 10.0000 mg | ORAL_TABLET | Freq: Every day | ORAL | Status: DC | PRN

## 2020-08-12 NOTE — Progress Notes (Signed)
9/10/20211:42 PM  Tammy Fernandez 1924-09-24, 86 y.o., female [id number]  Chief Complaint  Patient presents with  . Hypertension    follow up says, htn has been up    HPI:   Patient is a 86 y.o. female with past medical history significant for hyponatremia2/2 SIADH, constipation, HTN,dCHF,severepHTN, chronic respiratory failureon oxygen, CVA, GERD and seasonal allergieswho presents today for routine followup  Last OV July 2021 - no changes  Used to be on hydralazine but it was stopped due to low BP Recently seen by RT checking on bipap and she recommend something for managing of secretions Her son forgot to bring name of medication, which needs to be prescribed Oxygen was decreased to 1-1.5L  bipap has also been adjusted They are requesting rx for portable O2 backpak so that she able to walk better, [facility] She has completed covid vaccines She is requesting flu vaccine  Lab Results  Component Value Date   CREATININE 0.65 06/27/2020   BUN 14 06/27/2020   NA 133 (L) 06/27/2020   K 4.8 06/27/2020   CL 85 (L) 06/27/2020   CO2 35 (H) 06/27/2020   Lab Results  Component Value Date   TSH 1.430 06/27/2020    Depression screen [facility] 2/9 06/27/2020 02/09/2020 01/28/2020  Decreased Interest 0 0 0  Down, Depressed, Hopeless 0 0 0  PHQ - 2 Score 0 0 0  Altered sleeping - - -  Tired, decreased energy - - -  Change in appetite - - -  Feeling bad or failure about yourself  - - -  Trouble concentrating - - -  Moving slowly or fidgety/restless - - -  Suicidal thoughts - - -  PHQ-9 Score - - -  Difficult doing work/chores - - -  Some recent data might be hidden    Fall Risk  08/09/2020 06/27/2020 02/22/2020 02/09/2020 01/28/2020  Falls in the past year? 0 0 0 0 0  Number falls in past yr: 0 0 0 0 0  Injury with Fall? 0 0 0 0 0  Risk for fall due to : Impaired balance/gait;Impaired mobility - Impaired balance/gait;Impaired mobility - -  Risk for  fall due to: Comment - - - - -  Follow up Falls evaluation completed Falls evaluation completed Falls evaluation completed;Falls prevention discussed Falls evaluation completed Falls evaluation completed     Allergies  Allergen Reactions  . Crestor [Rosuvastatin Calcium] Other (See Comments)    Muscle Aches  . Keflex [Cephalexin] Other (See Comments)    dizziness  . Peanut Butter Flavor Hives  . Sulfa Antibiotics Hives  . Cephalexin Other (See Comments)    dizziness  . Sulfa Drugs Cross Reactors Rash    Prior to Admission medications   Medication Sig Start Date End Date Taking? Authorizing Provider  albuterol (PROVENTIL) (2.5 MG/3ML) 0.083% nebulizer solution USE 3 ML VIA NEBULIZER EVERY 4 HOURS AS NEEDED FOR WHEEZING OR SHORTNESS OF BREATH 07/11/20  Yes Myles Lipps, MD  ALPRAZolam Prudy Feeler) 1 MG tablet TAKE 1 TABLET(1 MG) BY MOUTH TWICE DAILY AS NEEDED FOR ANXIETY 06/20/20  Yes Myles Lipps, MD  amLODipine (NORVASC) 10 MG tablet TAKE 1 TABLET(10 MG) BY MOUTH DAILY 07/19/20  Yes Myles Lipps, MD  Aspirin 81 MG EC tablet Take 81 mg by mouth daily.   Yes [provider]  azelastine (ASTELIN) 0.1 % nasal spray Place 1 spray into both nostrils 2 (two) times daily. Use in each  nostril as directed 05/31/20  Yes Glenford Bayley, NP  cetirizine (ZYRTEC) 10 MG tablet Take 10 mg by mouth daily.   Yes [provider]  clobetasol ointment (TEMOVATE) 0.05 % Apply to affected area every night for 4 weeks, then every other day for 4 weeks and then twice a week for 4 weeks or until resolution. 02/02/20  Yes Constant, Peggy, MD  clotrimazole-betamethasone (LOTRISONE) cream APPLY TO THE AFFECTED AREA AS DIRECTED 02/29/20  Yes [provider]  demeclocycline (DECLOMYCIN) 300 MG tablet TAKE 1/2 TABLET BY MOUTH TWICE DAILY 10/13/19  Yes Myles Lipps, MD  diclofenac sodium (VOLTAREN) 1 % GEL Apply 2 g topically 4 (four) times daily. 06/02/19  Yes Stallings, Zoe A, MD    fluticasone (FLONASE) 50 MCG/ACT nasal spray SHAKE LIQUID AND USE 1 SPRAY IN EACH NOSTRIL TWICE DAILY 04/30/20  Yes Myles Lipps, MD  glycerin adult 2 g suppository Place 1 suppository rectally as needed for constipation. 01/27/19  Yes Myles Lipps, MD  ipratropium (ATROVENT) 0.03 % nasal spray USE 2 SPRAYS IN EACH NOSTRIL TWICE DAILY 04/01/20  Yes Collie Siad A, MD  levothyroxine (SYNTHROID) 50 MCG tablet TAKE 1 TABLET(50 MCG) BY MOUTH DAILY BEFORE BREAKFAST 07/19/20  Yes Myles Lipps, MD  meclizine (ANTIVERT) 12.5 MG tablet Take 1 tablet (12.5 mg total) by mouth 3 (three) times daily as needed for dizziness. 01/12/20  Yes Myles Lipps, MD  olopatadine (PATANOL) 0.1 % ophthalmic solution Place 1 drop into both eyes daily as needed for allergies. 01/28/20  Yes Myles Lipps, MD  omeprazole (PRILOSEC) 20 MG capsule Take 1 capsule (20 mg total) by mouth daily AND 2 capsules (40 mg total) at bedtime. 06/20/20 09/18/20 Yes Myles Lipps, MD  propranolol ER (INDERAL LA) 60 MG 24 hr capsule TAKE 1 CAPSULE(60 MG) BY MOUTH DAILY 06/28/20  Yes Myles Lipps, MD  Respiratory Therapy Supplies (NEBULIZER) DEVI Use with nebulized albuterol solution as needed for wheezing, cough or shortness of breath 04/13/20  Yes Myles Lipps, MD  sodium chloride 1 g tablet Take 2 tablets (2 g total) by mouth 2 (two) times daily. 06/29/20 09/27/20 Yes Myles Lipps, MD    Past Medical History:  Diagnosis Date  . Anxiety   . Chronic lower back pain   . Constipation    h/o fecal disimpaction on 2017  . CVA (cerebral vascular accident) (HCC) 2015   neg workup, MRI small acute lacunar infarcts. ASA only  . Depression   . Diastolic CHF, chronic (HCC)    grade I, most recent echo Jan 2019  . Fatty liver   . GERD (gastroesophageal reflux disease)   . Hyperlipidemia   . Hypertension   . Hyponatremia    multiple episodes with encephalopathy, renal thinks SIADH  . Hypothyroidism, unspecified   .  Internal hemorrhoid   . NSTEMI (non-ST elevated myocardial infarction) (HCC) 08/28/2016  . Palpitations   . PFO with atrial septal aneurysm    TTE 2015  . Pneumonia 2016  . Severe pulmonary arterial systolic hypertension (HCC) 12/25/2017   per echo, on 1L oxgen via [place]    Past Surgical History:  Procedure Laterality Date  . ABDOMINAL HYSTERECTOMY    . CATARACT EXTRACTION W/ INTRAOCULAR LENS  IMPLANT, BILATERAL Bilateral   . CESAREAN SECTION    . TEE WITHOUT CARDIOVERSION N/A 01/19/2014   Procedure: TRANSESOPHAGEAL ECHOCARDIOGRAM (TEE);  Surgeon: Laurey Morale, MD;  Location: [facility] ENDOSCOPY;  Service: Cardiovascular;  Laterality: N/A;  dayna /ja  . VAGINAL HYSTERECTOMY      Social History   Tobacco Use  . Smoking status: Never Smoker  . Smokeless tobacco: Former Neurosurgeon    Types: Snuff  . Tobacco comment: "used snuff when I was 19"  Substance Use Topics  . Alcohol use: No    Family History  Problem Relation Age of Onset  . Stroke Father   . Hypertension Father   . Colon cancer Cousin   . Colon cancer Other        Aunt  . Diabetes Other        aunt and cousins  . Heart disease Other        cousins  . Healthy Daughter   . Healthy Son     Review of Systems  Constitutional: Negative for chills and fever.  Respiratory: Negative for cough and shortness of breath.   Cardiovascular: Negative for chest pain, palpitations and leg swelling.  Gastrointestinal: Negative for abdominal pain, nausea and vomiting.     OBJECTIVE:  Today's Vitals   08/12/20 1340 08/12/20 1342  BP: (!) 156/77 (!) 151/63  Pulse: 61   Temp: 98.1 F (36.7 C)   SpO2: 100%   Weight: 152 lb (68.9 kg)   Height: 5\' 2"  (1.575 m)    Body mass index is 27.8 kg/m.   Wt Readings from Last 3 Encounters:  08/12/20 152 lb (68.9 kg)  06/17/20 158 lb (71.7 kg)  05/31/20 159 lb (72.1 kg)     Physical Exam Vitals and nursing note reviewed.  Constitutional:      Appearance: She is well-developed.    HENT:     Head: Normocephalic and atraumatic.     Mouth/Throat:     Pharynx: No oropharyngeal exudate.  Eyes:     General: No scleral icterus.    Extraocular Movements: Extraocular movements intact.     Conjunctiva/sclera: Conjunctivae normal.     Pupils: Pupils are equal, round, and reactive to light.  Cardiovascular:     Rate and Rhythm: Normal rate and regular rhythm.     Heart sounds: Normal heart sounds. No murmur heard.  No friction rub. No gallop.   Pulmonary:     Effort: Pulmonary effort is normal.     Breath sounds: Normal breath sounds. No wheezing, rhonchi or rales.  Musculoskeletal:     Cervical back: Neck supple.     Right lower leg: No edema.     Left lower leg: No edema.  Skin:    General: Skin is warm and dry.  Neurological:     Mental Status: She is alert and oriented to person, place, and time.     No results found for this or any previous visit (from the past 24 hour(s)).  No results found.   ASSESSMENT and PLAN  1. Essential hypertension Patient with h/o recurrent liable BP. Discussed checking BP daily and using hydralazine prn for BP > 150/90 - Basic metabolic panel  2. Hypothyroidism, unspecified type Controlled. Continue current regime.   3. Chronic respiratory failure with hypoxia (HCC) Handwritten rx for portable backpack oxygen [facility]ed [facility]  4. Need for prophylactic vaccination and inoculation against influenza - Flu Vaccine QUAD High Dose(Fluad)  Other orders - hydrALAZINE (APRESOLINE) 10 MG tablet; Take 1 tablet (10 mg total) by mouth daily as needed (take if BP > 150/90).  Return in about 4 weeks (around 09/09/2020) for TOC - needs to be a 40 min appt.  Doryce Mcgregory M Santiago, MD [facility] [place] [address] [place] [address] [place], [place] [address] Ph.  [phone] Fax [phone]   

## 2020-08-12 NOTE — Patient Instructions (Signed)
° ° ° °  If you have lab work done today you will be contacted with your lab results within the next 2 weeks.  If you have not heard from us then please contact us. The fastest way to get your results is to register for My Chart. ° ° °IF you received an x-ray today, you will receive an invoice from [facility]. Please contact  Radiology at [phone] with questions or concerns regarding your invoice.  ° °IF you received labwork today, you will receive an invoice from [facility]. Please contact [facility] at [phone] with questions or concerns regarding your invoice.  ° °Our billing staff will not be able to assist you with questions regarding bills from these companies. ° °You will be contacted with the lab results as soon as they are available. The fastest way to get your results is to activate your My Chart account. Instructions are located on the last page of this paperwork. If you have not heard from us regarding the results in 2 weeks, please contact this office. °  ° ° ° °

## 2020-08-12 NOTE — Telephone Encounter (Signed)
RX  [facility]ed to [facility] for back pack o2  [phone]

## 2020-08-15 ENCOUNTER — Ambulatory Visit: Payer: Medicare Other | Admitting: Obstetrics and Gynecology

## 2020-08-16 ENCOUNTER — Other Ambulatory Visit: Payer: Self-pay | Admitting: Family Medicine

## 2020-08-16 DIAGNOSIS — R062 Wheezing: Secondary | ICD-10-CM

## 2020-08-16 LAB — BASIC METABOLIC PANEL
BUN/Creatinine Ratio: 20 (ref 12–28)
BUN: 12 mg/dL (ref 10–36)
CO2: 33 mmol/L — ABNORMAL HIGH (ref 20–29)
Calcium: 9.9 mg/dL (ref 8.7–10.3)
Chloride: 87 mmol/L — ABNORMAL LOW (ref 96–106)
Creatinine, Ser: 0.59 mg/dL (ref 0.57–1.00)
GFR calc Af Amer: 91 mL/min/{1.73_m2} (ref >59)
GFR calc non Af Amer: 79 mL/min/{1.73_m2} (ref >59)
Glucose: 84 mg/dL (ref 65–99)
Potassium: 4.9 mmol/L (ref 3.5–5.2)
Sodium: 134 mmol/L (ref 134–144)

## 2020-08-17 ENCOUNTER — Other Ambulatory Visit: Payer: Self-pay | Admitting: Family Medicine

## 2020-08-17 MED ORDER — NEBULIZER DEVI: 0 refills | Status: AC

## 2020-08-17 NOTE — Telephone Encounter (Signed)
Medication Refill - Medication: levothyroxine (SYNTHROID) 50 MCG tablet [[id number]]   amLODipine (NORVASC) 10 MG tablet [[id number]]   ALPRAZolam (XANAX) 1 MG tablet [[id number]]   propranolol ER (INDERAL LA) 60 MG 24 hr capsule [[id number]]   sodium chloride 1 g tablet [[id number]]   demeclocycline (DECLOMYCIN) 300 MG tablet [[id number]]   Preferred Pharmacy (with phone number or street name):  [id number] [place], [place] - [address] AT [facility] [address] [facility] [address]  [address] [place] [address]  Phone: [phone] Fax: [phone]     Agent: Please be advised that RX refills may take up to 3 business days. We ask that you follow-up with your pharmacy.

## 2020-08-17 NOTE — Telephone Encounter (Signed)
Requested medication (s) are due for refill today: Alprazolam and demeclocycline  Requested medication (s) are on the active medication list: Yes  Last refill:  Alprazolam 06/20/20  demeclocycline 10/23/19  Future visit scheduled: No  Notes to clinic:  All other medications are too early.    Requested Prescriptions  Pending Prescriptions Disp Refills   amLODipine (NORVASC) 10 MG tablet 90 tablet 1      Cardiovascular:  Calcium Channel Blockers Failed - 08/17/2020 10:49 AM      Failed - Last BP in normal range    BP Readings from Last 1 Encounters:  08/12/20 (!) 151/63          Passed - Valid encounter within last 6 months    Recent Outpatient Visits           5 days ago Essential hypertension   [facility], Med[facility], MD   1 month ago Essential hypertension   [facility], Meda Coffee, MD   5 months ago Hyperkalemia   Primary Care at [facility], Manus Rudd, MD   6 months ago Essential hypertension   [facility], Meda Coffee, MD   6 months ago Essential hypertension   [facility], Meda Coffee, MD                propranolol ER (INDERAL LA) 60 MG 24 hr capsule 90 capsule 1    Sig: TAKE 1 CAPSULE(60 MG) BY MOUTH DAILY      Cardiovascular:  Beta Blockers Failed - 08/17/2020 10:49 AM      Failed - Last BP in normal range    BP Readings from Last 1 Encounters:  08/12/20 (!) 151/63          Passed - Last Heart Rate in normal range    Pulse Readings from Last 1 Encounters:  08/12/20 61          Passed - Valid encounter within last 6 months    Recent Outpatient Visits           5 days ago Essential hypertension   [facility], Meda Coffee, MD   1 month ago Essential hypertension   [facility], Meda Coffee, MD   5 months ago Hyperkalemia   Primary Care at [facility], Manus Rudd, MD   6 months ago Essential hypertension   [facility], Meda Coffee, MD    6 months ago Essential hypertension   [facility], Meda Coffee, MD                levothyroxine (SYNTHROID) 50 MCG tablet 90 tablet 3      Endocrinology:  Hypothyroid Agents Failed - 08/17/2020 10:49 AM      Failed - TSH needs to be rechecked within 3 months after an abnormal result. Refill until TSH is due.      Passed - TSH in normal range and within 360 days    TSH  Date Value Ref Range Status  06/27/2020 1.430 0.450 - 4.500 uIU/mL Final          Passed - Valid encounter within last 12 months    Recent Outpatient Visits           5 days ago Essential hypertension   [facility], Meda Coffee, MD   1 month ago Essential hypertension   Primary Care at [facility],  Meda Coffee, MD   5 months ago Hyperkalemia   [facility] Care at [facility], Manus Rudd, MD   6 months ago Essential hypertension   [facility], Meda Coffee, MD   6 months ago Essential hypertension   [facility], Meda Coffee, MD                ALPRAZolam Prudy Feeler) 1 MG tablet 60 tablet 2      Not Delegated - Psychiatry:  Anxiolytics/Hypnotics Failed - 08/17/2020 10:49 AM      Failed - This refill cannot be delegated      Failed - Urine Drug Screen completed in last 360 days.      Passed - Valid encounter within last 6 months    Recent Outpatient Visits           5 days ago Essential hypertension   [facility], Meda Coffee, MD   1 month ago Essential hypertension   [facility], Meda Coffee, MD   5 months ago Hyperkalemia   Primary Care at [facility], Manus Rudd, MD   6 months ago Essential hypertension   [facility], Meda Coffee, MD   6 months ago Essential hypertension   [facility], Meda Coffee, MD                demeclocycline (DECLOMYCIN) 300 MG tablet 60 tablet 5    Sig: Take 0.5 tablets (150 mg total) by mouth 2 (two) times daily.      Off-Protocol Failed -  08/17/2020 10:49 AM      Failed - Medication not assigned to a protocol, review manually.      Passed - Valid encounter within last 12 months    Recent Outpatient Visits           5 days ago Essential hypertension   [facility], Meda Coffee, MD   1 month ago Essential hypertension   [facility], Meda Coffee, MD   5 months ago Hyperkalemia   Primary Care at [facility], Manus Rudd, MD   6 months ago Essential hypertension   [facility], Meda Coffee, MD   6 months ago Essential hypertension   [facility], Meda Coffee, MD

## 2020-08-18 ENCOUNTER — Encounter: Payer: Self-pay | Admitting: Family Medicine

## 2020-08-18 ENCOUNTER — Telehealth: Payer: Self-pay

## 2020-08-18 NOTE — Telephone Encounter (Signed)
Rx faxed to [facility] for nebulizer device [phone]

## 2020-08-19 MED ORDER — AMLODIPINE BESYLATE 10 MG PO TABS: ORAL_TABLET | ORAL | 1 refills | Status: DC

## 2020-08-19 MED ORDER — GUAIFENESIN ER 600 MG PO TB12: 600.0000 mg | ORAL_TABLET | Freq: Two times a day (BID) | ORAL | 5 refills | Status: AC | PRN

## 2020-08-19 MED ORDER — PROPRANOLOL HCL ER 60 MG PO CP24: ORAL_CAPSULE | ORAL | 1 refills | Status: DC

## 2020-08-19 MED ORDER — LEVOTHYROXINE SODIUM 50 MCG PO TABS: ORAL_TABLET | ORAL | 3 refills | Status: DC

## 2020-08-19 MED ORDER — ALPRAZOLAM 1 MG PO TABS: 1.0000 mg | ORAL_TABLET | Freq: Two times a day (BID) | ORAL | 2 refills | Status: DC | PRN

## 2020-08-19 NOTE — Telephone Encounter (Signed)
pmp reviewed  Med refilled 

## 2020-08-23 ENCOUNTER — Encounter: Payer: Self-pay | Admitting: Obstetrics and Gynecology

## 2020-08-23 ENCOUNTER — Ambulatory Visit (INDEPENDENT_AMBULATORY_CARE_PROVIDER_SITE_OTHER): Payer: Medicare Other | Admitting: Obstetrics and Gynecology

## 2020-08-23 ENCOUNTER — Other Ambulatory Visit (HOSPITAL_COMMUNITY)
Admission: RE | Admit: 2020-08-23 | Discharge: 2020-08-23 | Disposition: A | Discharge status: Home / Self Care | Payer: Medicare Other | Source: Ambulatory Visit | Attending: Obstetrics and Gynecology | Admitting: Obstetrics and Gynecology

## 2020-08-23 VITALS — BP 165/75 | HR 75 | Wt 152.0 lb

## 2020-08-23 DIAGNOSIS — N760 Acute vaginitis: Secondary | ICD-10-CM | POA: Insufficient documentation

## 2020-08-23 MED ORDER — CLOTRIMAZOLE-BETAMETHASONE 1-0.05 % EX CREA: 1.0000 "application " | TOPICAL_CREAM | Freq: Two times a day (BID) | CUTANEOUS | 0 refills | Status: DC

## 2020-08-23 NOTE — Progress Notes (Signed)
GYN patient presents for problem visit with complaint of vaginal bumps x 3 months and vaginal soreness  Pt notes bumps on buttocks as well. Pt has tried treatment Rx and no relief  Pt notes she had a yeast infection in the past the go away however the vaginal bumps are not going away. Pt is not sure what is causing the bumps.

## 2020-08-23 NOTE — Progress Notes (Signed)
86 yo presenting for the evaluation of vulva soreness. Patient reports being treated for recurrent yeast infections but vagina soreness remains. She denies current pruritis or abnormal discharge. She denies urinary incontinence. She wears a diaper and denies any accidents  Past Medical History:  Diagnosis Date  . Anxiety   . Chronic lower back pain   . Constipation    h/o fecal disimpaction on 2017  . CVA (cerebral vascular accident) (HCC) 2015   neg workup, MRI small acute lacunar infarcts. ASA only  . Depression   . Diastolic CHF, chronic (HCC)    grade I, most recent echo Jan 2019  . Fatty liver   . GERD (gastroesophageal reflux disease)   . Hyperlipidemia   . Hypertension   . Hyponatremia    multiple episodes with encephalopathy, renal thinks SIADH  . Hypothyroidism, unspecified   . Internal hemorrhoid   . NSTEMI (non-ST elevated myocardial infarction) (HCC) 08/28/2016  . Palpitations   . PFO with atrial septal aneurysm    TTE 2015  . Pneumonia 2016  . Severe pulmonary arterial systolic hypertension (HCC) 12/25/2017   per echo, on 1L oxgen via [place]   Past Surgical History:  Procedure Laterality Date  . ABDOMINAL HYSTERECTOMY    . CATARACT EXTRACTION W/ INTRAOCULAR LENS  IMPLANT, BILATERAL Bilateral   . CESAREAN SECTION    . TEE WITHOUT CARDIOVERSION N/A 01/19/2014   Procedure: TRANSESOPHAGEAL ECHOCARDIOGRAM (TEE);  Surgeon: Laurey Morale, MD;  Location: [facility] ENDOSCOPY;  Service: Cardiovascular;  Laterality: N/A;  [name]  . VAGINAL HYSTERECTOMY     Family History  Problem Relation Age of Onset  . Stroke Father   . Hypertension Father   . Colon cancer Cousin   . Colon cancer Other        Aunt  . Diabetes Other        aunt and cousins  . Heart disease Other        cousins  . Healthy Daughter   . Healthy Son    Social History   Tobacco Use  . Smoking status: Never Smoker  . Smokeless tobacco: Former Neurosurgeon    Types: Snuff  . Tobacco comment: "used snuff when I  was 19"  Vaping Use  . Vaping Use: Never used  Substance Use Topics  . Alcohol use: No  . Drug use: No   ROS See pertinent in HPI. All other systems non contributory  Blood pressure (!) 165/75, pulse 75, weight 152 lb (68.9 kg). GENERAL: Well-developed, well-nourished female in no acute distress. Patient with oxygen tank, wheel chair bound PELVIC: Normal external female genitalia with areas of excoriation on both labia minora. Vagina is pale pink and atrophic.   EXTREMITIES: No cyanosis, clubbing, or edema, 2+ distal pulses.  A/P 86 yo with vulvovaginitis - vaginal swab collected - rx nystatin cream provided - patient will be contacted with results - RTC prn

## 2020-08-24 LAB — CERVICOVAGINAL ANCILLARY ONLY
Bacterial Vaginitis (gardnerella): NEGATIVE
Candida Glabrata: NEGATIVE
Candida Vaginitis: NEGATIVE
Comment: NEGATIVE
Comment: NEGATIVE
Comment: NEGATIVE

## 2020-08-29 ENCOUNTER — Institutional Professional Consult (permissible substitution): Payer: Medicare Other | Admitting: Pulmonary Disease

## 2020-08-29 DIAGNOSIS — Z1159 Encounter for screening for other viral diseases: Secondary | ICD-10-CM | POA: Diagnosis not present

## 2020-08-31 DIAGNOSIS — B9562 Methicillin resistant Staphylococcus aureus infection as the cause of diseases classified elsewhere: Secondary | ICD-10-CM | POA: Diagnosis not present

## 2020-08-31 DIAGNOSIS — R7881 Bacteremia: Secondary | ICD-10-CM | POA: Diagnosis not present

## 2020-08-31 DIAGNOSIS — I6789 Other cerebrovascular disease: Secondary | ICD-10-CM | POA: Diagnosis not present

## 2020-08-31 DIAGNOSIS — J9601 Acute respiratory failure with hypoxia: Secondary | ICD-10-CM | POA: Diagnosis not present

## 2020-09-02 DIAGNOSIS — D72829 Elevated white blood cell count, unspecified: Secondary | ICD-10-CM | POA: Diagnosis not present

## 2020-09-02 DIAGNOSIS — J0100 Acute maxillary sinusitis, unspecified: Secondary | ICD-10-CM | POA: Diagnosis not present

## 2020-09-02 DIAGNOSIS — E871 Hypo-osmolality and hyponatremia: Secondary | ICD-10-CM | POA: Diagnosis not present

## 2020-09-06 DIAGNOSIS — N390 Urinary tract infection, site not specified: Secondary | ICD-10-CM | POA: Diagnosis not present

## 2020-09-12 ENCOUNTER — Other Ambulatory Visit: Payer: Self-pay | Admitting: Family Medicine

## 2020-09-12 NOTE — Telephone Encounter (Signed)
Requested medication (s) are due for refill today: yes  Requested medication (s) are on the active medication list: yes  Last refill:  10/13/19  Future visit scheduled: no  Notes to clinic:  med not assigned to a protocol   Requested Prescriptions  Pending Prescriptions Disp Refills   demeclocycline (DECLOMYCIN) 300 MG tablet [Pharmacy Med Name: DEMECLOCYCLINE 300MG  TABLETS] 60 tablet 5    Sig: TAKE 1/2 TABLET BY MOUTH TWICE DAILY      Off-Protocol Failed - 09/12/2020  4:47 PM      Failed - Medication not assigned to a protocol, review manually.      Passed - Valid encounter within last 12 months    Recent Outpatient Visits           1 month ago Essential hypertension   Primary Care at 11/12/2020, [facility], MD   2 months ago Essential hypertension   [facility] at [facility], [facility], MD   6 months ago Hyperkalemia   Primary Care at [facility], [facility], MD   7 months ago Essential hypertension   [facility] at [facility], [facility], MD   7 months ago Essential hypertension   [facility] Care at [facility], [facility], MD

## 2020-09-14 ENCOUNTER — Other Ambulatory Visit: Payer: Self-pay | Admitting: Family Medicine

## 2020-09-14 DIAGNOSIS — I110 Hypertensive heart disease with heart failure: Secondary | ICD-10-CM | POA: Diagnosis not present

## 2020-09-14 DIAGNOSIS — I252 Old myocardial infarction: Secondary | ICD-10-CM | POA: Diagnosis not present

## 2020-09-14 DIAGNOSIS — Z7982 Long term (current) use of aspirin: Secondary | ICD-10-CM | POA: Diagnosis not present

## 2020-09-14 DIAGNOSIS — E039 Hypothyroidism, unspecified: Secondary | ICD-10-CM | POA: Diagnosis not present

## 2020-09-14 DIAGNOSIS — M7502 Adhesive capsulitis of left shoulder: Secondary | ICD-10-CM | POA: Diagnosis not present

## 2020-09-14 DIAGNOSIS — I69391 Dysphagia following cerebral infarction: Secondary | ICD-10-CM | POA: Diagnosis not present

## 2020-09-14 DIAGNOSIS — Z9981 Dependence on supplemental oxygen: Secondary | ICD-10-CM | POA: Diagnosis not present

## 2020-09-14 DIAGNOSIS — Z87440 Personal history of urinary (tract) infections: Secondary | ICD-10-CM | POA: Diagnosis not present

## 2020-09-14 DIAGNOSIS — K760 Fatty (change of) liver, not elsewhere classified: Secondary | ICD-10-CM | POA: Diagnosis not present

## 2020-09-14 DIAGNOSIS — K222 Esophageal obstruction: Secondary | ICD-10-CM | POA: Diagnosis not present

## 2020-09-14 DIAGNOSIS — M5459 Other low back pain: Secondary | ICD-10-CM | POA: Diagnosis not present

## 2020-09-14 DIAGNOSIS — I5032 Chronic diastolic (congestive) heart failure: Secondary | ICD-10-CM | POA: Diagnosis not present

## 2020-09-14 DIAGNOSIS — L988 Other specified disorders of the skin and subcutaneous tissue: Secondary | ICD-10-CM | POA: Diagnosis not present

## 2020-09-14 DIAGNOSIS — K219 Gastro-esophageal reflux disease without esophagitis: Secondary | ICD-10-CM | POA: Diagnosis not present

## 2020-09-14 DIAGNOSIS — I2721 Secondary pulmonary arterial hypertension: Secondary | ICD-10-CM | POA: Diagnosis not present

## 2020-09-14 DIAGNOSIS — D649 Anemia, unspecified: Secondary | ICD-10-CM | POA: Diagnosis not present

## 2020-09-14 DIAGNOSIS — E222 Syndrome of inappropriate secretion of antidiuretic hormone: Secondary | ICD-10-CM | POA: Diagnosis not present

## 2020-09-14 DIAGNOSIS — J9611 Chronic respiratory failure with hypoxia: Secondary | ICD-10-CM | POA: Diagnosis not present

## 2020-09-14 DIAGNOSIS — F32A Depression, unspecified: Secondary | ICD-10-CM | POA: Diagnosis not present

## 2020-09-14 DIAGNOSIS — Z7951 Long term (current) use of inhaled steroids: Secondary | ICD-10-CM | POA: Diagnosis not present

## 2020-09-14 DIAGNOSIS — E785 Hyperlipidemia, unspecified: Secondary | ICD-10-CM | POA: Diagnosis not present

## 2020-09-14 NOTE — Telephone Encounter (Signed)
Requested medication (s) are due for refill today -yes  Requested medication (s) are on the active medication list -yes  Future visit scheduled -yes  Last refill: 07/19/20  Notes to clinic: Request RF of medication not assigned to protocol- refused by office 09/13/20  Requested Prescriptions  Pending Prescriptions Disp Refills   demeclocycline (DECLOMYCIN) 300 MG tablet [Pharmacy Med Name: DEMECLOCYCLINE 300MG  TABLETS] 60 tablet 5    Sig: TAKE 1/2 TABLET BY MOUTH TWICE DAILY      Off-Protocol Failed - 09/14/2020  1:51 PM      Failed - Medication not assigned to a protocol, review manually.      Passed - Valid encounter within last 12 months    Recent Outpatient Visits           1 month ago Essential hypertension   Primary Care at 09/16/2020, [facility], MD   2 months ago Essential hypertension   [facility] at [facility], [facility], MD   6 months ago Hyperkalemia   Primary Care at [facility], [facility], MD   7 months ago Essential hypertension   [facility] at [facility], [facility], MD   7 months ago Essential hypertension   [facility] at [facility], [facility], MD       Future Appointments             In 1 week Just, [facility], FNP Primary Care at [place], [facility]                Requested Prescriptions  Pending Prescriptions Disp Refills   demeclocycline (DECLOMYCIN) 300 MG tablet [Pharmacy Med Name: DEMECLOCYCLINE 300MG  TABLETS] 60 tablet 5    Sig: TAKE 1/2 TABLET BY MOUTH TWICE DAILY      Off-Protocol Failed - 09/14/2020  1:51 PM      Failed - Medication not assigned to a protocol, review manually.      Passed - Valid encounter within last 12 months    Recent Outpatient Visits           1 month ago Essential hypertension   Primary Care at , 09/16/2020, MD   2 months ago Essential hypertension   [facility], Meda Coffee, MD   6 months ago Hyperkalemia   Primary Care at [facility], Meda Coffee, MD   7 months ago  Essential hypertension   Primary Care at [facility], Manus Rudd, MD   7 months ago Essential hypertension   [facility] at [facility], Meda Coffee, MD       Future Appointments             In 1 week Just, [facility], FNP Primary Care at [place], [facility]

## 2020-09-16 ENCOUNTER — Other Ambulatory Visit: Payer: Self-pay | Admitting: *Deleted

## 2020-09-16 NOTE — Patient Outreach (Signed)
[facility]) Care Management  09/16/2020  Tammy Fernandez 19-Mar-1924 [id number]   RN Health Coach telephone call to son Tammy Fernandez.  [employer] compliance verified. Son is looking for a care giver or PCS. Patient is getting physical therapy service from [facility]. Per son patient has been waiting for over 2 months for caregiver assistance from another company and never received.    Plan : Referred to social worker  Gean Maidens BSN RN [facility] Care Management [phone]

## 2020-09-20 ENCOUNTER — Other Ambulatory Visit: Payer: Self-pay | Admitting: *Deleted

## 2020-09-20 NOTE — Patient Outreach (Signed)
[facility]) Care Management  09/20/2020  Tammy Fernandez 10-24-1924 [id number]   Pt's son called CSW back and reports that [facility] indicated they only received one of the two forms.  Pt's son shared that he will be taking his mother to see PCP later this week.  CSW offered to email him the forms again and to have them completed at PCP's office at that time.  Pt's son advised to call CSW if problems or questions arise, however he can also call [facility] directly for updates once forms are completed and faxed/mailed to them.  Reece Levy, MSW, LCSW Clinical Social Worker  [facility] [phone]

## 2020-09-20 NOTE — Patient Outreach (Signed)
[facility]) Care Management  09/20/2020  Tammy Fernandez [birth year] [id number]   CSW received referral for [facility] assistance.  CSW made contact with pt's son, [name], today and confirmed pt's identity.  CSW familiar with pt and son from recent/previous consult.  Per son, pt's PCP completed the [facility] application paperwork and he has not heard anything more from [employer] [facility] provider).   CSW advised son that the application process time as well as staffing for the [facility] care are likely delayed because of COVID delays (less workers,etc).   CSW provided pt's son with the contact # to call [employer] to inquire.   Reece Levy, MSW, LCSW Clinical Social Worker  [facility] [employer] [phone]

## 2020-09-21 DIAGNOSIS — E785 Hyperlipidemia, unspecified: Secondary | ICD-10-CM | POA: Diagnosis not present

## 2020-09-21 DIAGNOSIS — Z7951 Long term (current) use of inhaled steroids: Secondary | ICD-10-CM | POA: Diagnosis not present

## 2020-09-21 DIAGNOSIS — M5459 Other low back pain: Secondary | ICD-10-CM | POA: Diagnosis not present

## 2020-09-21 DIAGNOSIS — M7502 Adhesive capsulitis of left shoulder: Secondary | ICD-10-CM | POA: Diagnosis not present

## 2020-09-21 DIAGNOSIS — I5032 Chronic diastolic (congestive) heart failure: Secondary | ICD-10-CM | POA: Diagnosis not present

## 2020-09-21 DIAGNOSIS — L988 Other specified disorders of the skin and subcutaneous tissue: Secondary | ICD-10-CM | POA: Diagnosis not present

## 2020-09-21 DIAGNOSIS — J9611 Chronic respiratory failure with hypoxia: Secondary | ICD-10-CM | POA: Diagnosis not present

## 2020-09-21 DIAGNOSIS — I110 Hypertensive heart disease with heart failure: Secondary | ICD-10-CM | POA: Diagnosis not present

## 2020-09-21 DIAGNOSIS — E222 Syndrome of inappropriate secretion of antidiuretic hormone: Secondary | ICD-10-CM | POA: Diagnosis not present

## 2020-09-21 DIAGNOSIS — I252 Old myocardial infarction: Secondary | ICD-10-CM | POA: Diagnosis not present

## 2020-09-21 DIAGNOSIS — K219 Gastro-esophageal reflux disease without esophagitis: Secondary | ICD-10-CM | POA: Diagnosis not present

## 2020-09-21 DIAGNOSIS — I69391 Dysphagia following cerebral infarction: Secondary | ICD-10-CM | POA: Diagnosis not present

## 2020-09-21 DIAGNOSIS — Z87440 Personal history of urinary (tract) infections: Secondary | ICD-10-CM | POA: Diagnosis not present

## 2020-09-21 DIAGNOSIS — F32A Depression, unspecified: Secondary | ICD-10-CM | POA: Diagnosis not present

## 2020-09-21 DIAGNOSIS — D649 Anemia, unspecified: Secondary | ICD-10-CM | POA: Diagnosis not present

## 2020-09-21 DIAGNOSIS — K760 Fatty (change of) liver, not elsewhere classified: Secondary | ICD-10-CM | POA: Diagnosis not present

## 2020-09-21 DIAGNOSIS — E039 Hypothyroidism, unspecified: Secondary | ICD-10-CM | POA: Diagnosis not present

## 2020-09-21 DIAGNOSIS — Z9981 Dependence on supplemental oxygen: Secondary | ICD-10-CM | POA: Diagnosis not present

## 2020-09-21 DIAGNOSIS — K222 Esophageal obstruction: Secondary | ICD-10-CM | POA: Diagnosis not present

## 2020-09-21 DIAGNOSIS — Z7982 Long term (current) use of aspirin: Secondary | ICD-10-CM | POA: Diagnosis not present

## 2020-09-21 DIAGNOSIS — I2721 Secondary pulmonary arterial hypertension: Secondary | ICD-10-CM | POA: Diagnosis not present

## 2020-09-22 ENCOUNTER — Ambulatory Visit (INDEPENDENT_AMBULATORY_CARE_PROVIDER_SITE_OTHER): Payer: Medicare Other | Admitting: Family Medicine

## 2020-09-22 ENCOUNTER — Ambulatory Visit: Payer: Self-pay

## 2020-09-22 ENCOUNTER — Other Ambulatory Visit: Payer: Self-pay

## 2020-09-22 ENCOUNTER — Ambulatory Visit (INDEPENDENT_AMBULATORY_CARE_PROVIDER_SITE_OTHER): Payer: Medicare Other

## 2020-09-22 VITALS — BP 160/68 | HR 59 | Temp 98.2°F | Ht 62.0 in | Wt 157.0 lb

## 2020-09-22 DIAGNOSIS — B49 Unspecified mycosis: Secondary | ICD-10-CM

## 2020-09-22 DIAGNOSIS — G20 Parkinson's disease: Secondary | ICD-10-CM

## 2020-09-22 DIAGNOSIS — I10 Essential (primary) hypertension: Secondary | ICD-10-CM | POA: Diagnosis not present

## 2020-09-22 DIAGNOSIS — D638 Anemia in other chronic diseases classified elsewhere: Secondary | ICD-10-CM | POA: Diagnosis not present

## 2020-09-22 DIAGNOSIS — R059 Cough, unspecified: Secondary | ICD-10-CM | POA: Diagnosis not present

## 2020-09-22 DIAGNOSIS — E039 Hypothyroidism, unspecified: Secondary | ICD-10-CM | POA: Diagnosis not present

## 2020-09-22 DIAGNOSIS — R42 Dizziness and giddiness: Secondary | ICD-10-CM | POA: Diagnosis not present

## 2020-09-22 DIAGNOSIS — L89153 Pressure ulcer of sacral region, stage 3: Secondary | ICD-10-CM

## 2020-09-22 DIAGNOSIS — G8194 Hemiplegia, unspecified affecting left nondominant side: Secondary | ICD-10-CM

## 2020-09-22 DIAGNOSIS — K5900 Constipation, unspecified: Secondary | ICD-10-CM

## 2020-09-22 DIAGNOSIS — K219 Gastro-esophageal reflux disease without esophagitis: Secondary | ICD-10-CM

## 2020-09-22 DIAGNOSIS — J9811 Atelectasis: Secondary | ICD-10-CM | POA: Diagnosis not present

## 2020-09-22 DIAGNOSIS — E871 Hypo-osmolality and hyponatremia: Secondary | ICD-10-CM

## 2020-09-22 MED ORDER — FAMOTIDINE 20 MG PO TABS: 20.0000 mg | ORAL_TABLET | Freq: Two times a day (BID) | ORAL | 3 refills | Status: DC

## 2020-09-22 MED ORDER — PROPRANOLOL HCL ER 60 MG PO CP24: ORAL_CAPSULE | ORAL | 3 refills | Status: DC

## 2020-09-22 MED ORDER — OMEPRAZOLE 20 MG PO CPDR: DELAYED_RELEASE_CAPSULE | ORAL | 3 refills | Status: DC

## 2020-09-22 MED ORDER — SODIUM CHLORIDE 1 G PO TABS: 2.0000 g | ORAL_TABLET | Freq: Two times a day (BID) | ORAL | 6 refills | Status: DC

## 2020-09-22 MED ORDER — DEMECLOCYCLINE HCL 300 MG PO TABS: 150.0000 mg | ORAL_TABLET | Freq: Two times a day (BID) | ORAL | 5 refills | Status: DC

## 2020-09-22 MED ORDER — SENNA 8.6 MG PO TABS: 1.0000 | ORAL_TABLET | Freq: Every day | ORAL | 3 refills | Status: AC

## 2020-09-22 MED ORDER — LEVOTHYROXINE SODIUM 50 MCG PO TABS: ORAL_TABLET | ORAL | 3 refills | Status: AC

## 2020-09-22 MED ORDER — CLOTRIMAZOLE-BETAMETHASONE 1-0.05 % EX CREA: 1.0000 "application " | TOPICAL_CREAM | Freq: Two times a day (BID) | CUTANEOUS | 2 refills | Status: DC

## 2020-09-22 MED ORDER — OLOPATADINE HCL 0.1 % OP SOLN: 1.0000 [drp] | Freq: Every day | OPHTHALMIC | 5 refills | Status: DC | PRN

## 2020-09-22 MED ORDER — AMLODIPINE BESYLATE 10 MG PO TABS: ORAL_TABLET | ORAL | 3 refills | Status: DC

## 2020-09-22 MED ORDER — HYDRALAZINE HCL 10 MG PO TABS: 10.0000 mg | ORAL_TABLET | Freq: Every day | ORAL | 3 refills | Status: DC | PRN

## 2020-09-22 NOTE — Patient Instructions (Addendum)
If you have lab work done today you will be contacted with your lab results within the next 2 weeks.  If you have not heard from [place] then please contact us. The fastest way to get your results is to register for My Chart.   IF you received an x-ray today, you will receive an invoice from [facility]. Please contact [facility] at [phone] with questions or concerns regarding your invoice.   IF you received labwork today, you will receive an invoice from [place]. Please contact [facility] at [phone] with questions or concerns regarding your invoice.   Our billing staff will not be able to assist you with questions regarding bills from these companies.  You will be contacted with the lab results as soon as they are available. The fastest way to get your results is to activate your My Chart account. Instructions are located on the last page of this paperwork. If you have not heard from [place] regarding the results in 2 weeks, please contact this office.      Viral Respiratory Infection A viral respiratory infection is an illness that affects parts of the body that are used for breathing. These include the lungs, nose, and throat. It is caused by a germ called a virus. Some examples of this kind of infection are:  A cold.  The flu (influenza).  A respiratory syncytial virus (RSV) infection. A person who gets this illness may have the following symptoms:  A stuffy or runny nose.  Yellow or green fluid in the nose.  A cough.  Sneezing.  Tiredness (fatigue).  Achy muscles.  A sore throat.  Sweating or chills.  A fever.  A headache. Follow these instructions at home: Managing pain and congestion  Take over-the-counter and prescription medicines only as told by your doctor.  If you have a sore throat, gargle with salt water. Do this 3-4 times per day or as needed. To make a salt-water mixture, dissolve -1 tsp of salt in 1 cup of warm water. Make sure  that all the salt dissolves.  Use nose drops made from salt water. This helps with stuffiness (congestion). It also helps soften the skin around your nose.  Drink enough fluid to keep your pee (urine) pale yellow. General instructions   Rest as much as possible.  Do not drink alcohol.  Do not use any products that have nicotine or tobacco, such as cigarettes and e-cigarettes. If you need help quitting, ask your doctor.  Keep all follow-up visits as told by your doctor. This is important. How is this prevented?   Get a flu shot every year. Ask your doctor when you should get your flu shot.  Do not let other people get your germs. If you are sick: ? Stay home from work or school. ? Wash your hands with soap and water often. Wash your hands after you cough or sneeze. If soap and water are not available, use hand sanitizer.  Avoid contact with people who are sick during cold and flu season. This is in fall and winter. Get help if:  Your symptoms last for 10 days or longer.  Your symptoms get worse over time.  You have a fever.  You have very bad pain in your face or forehead.  Parts of your jaw or neck become very swollen. Get help right away if:  You feel pain or pressure in your chest.  You have shortness of breath.  You faint or feel like you will  will faint.  You keep throwing up (vomiting).  You feel confused. Summary  A viral respiratory infection is an illness that affects parts of the body that are used for breathing.  Examples of this illness include a cold, the flu, and respiratory syncytial virus (RSV) infection.  The infection can cause a runny nose, cough, sneezing, sore throat, and fever.  Follow what your doctor tells you about taking medicines, drinking lots of fluid, washing your hands, resting at home, and avoiding people who are sick. This information is not intended to replace advice given to you by your health care provider. Make sure you  discuss any questions you have with your health care provider. Document Revised: 11/27/2018 Document Reviewed: 12/30/2017 Elsevier Patient Education  2020 [employer]  

## 2020-09-22 NOTE — Progress Notes (Signed)
10/21/20214:52 PM  KIENNA MONCADA Dec 14, 1923, 86 y.o., female [id number]  Chief Complaint  Patient presents with  . Cough     x 1 week - chest congestion and phlegm. dizzyness, headache, light sensitivity, sinus preassure and pain     HPI:   Patient is a 86 y.o. female with past medical history significant for hyponatremia2/2 SIADH, constipation, HTN,dCHF,severepHTN, chronic respiratory failureon oxygen, CVA, GERD and seasonal allergieswho presents today forroutine followup and a cough.  Congestion and wet cough, started last Thursday Frequent phlegm and Rhinorrhea Phlegm is clear. Dizziness was worse over the past two days with increased congestion Eating and drinking adequate She has tried Mucinex, flonase, tea, saline solution, nebulizers, tylenol Denies myalgias Symptoms do seem to be improving She has questions about the frequent use of benadryl.  She has frequent issues with Constipation for which she uses yogurt  She has a Sacrum pressure sore followed by home health   Depression screen [facility] 2/9 09/22/2020 06/27/2020 02/09/2020  Decreased Interest 0 0 0  Down, Depressed, Hopeless 0 0 0  PHQ - 2 Score 0 0 0  Altered sleeping - - -  Tired, decreased energy - - -  Change in appetite - - -  Feeling bad or failure about yourself  - - -  Trouble concentrating - - -  Moving slowly or fidgety/restless - - -  Suicidal thoughts - - -  PHQ-9 Score - - -  Difficult doing work/chores - - -  Some recent data might be hidden    Fall Risk  09/22/2020 08/09/2020 06/27/2020 02/22/2020 02/09/2020  Falls in the past year? 1 0 0 0 0  Number falls in past yr: 1 0 0 0 0  Injury with Fall? 0 0 0 0 0  Risk for fall due to : - Impaired balance/gait;Impaired mobility - Impaired balance/gait;Impaired mobility -  Risk for fall due to: Comment - - - - -  Follow up Falls evaluation completed Falls evaluation completed Falls evaluation completed Falls evaluation completed;Falls prevention  discussed Falls evaluation completed     Allergies  Allergen Reactions  . Crestor [Rosuvastatin Calcium] Other (See Comments)    Muscle Aches  . Keflex [Cephalexin] Other (See Comments)    dizziness  . Peanut Butter Flavor Hives  . Sulfa Antibiotics Hives  . Cephalexin Other (See Comments)    dizziness  . Sulfa Drugs Cross Reactors Rash    Prior to Admission medications   Medication Sig Start Date End Date Taking? Authorizing Provider  albuterol (PROVENTIL) (2.5 MG/3ML) 0.083% nebulizer solution USE 3 ML VIA NEBULIZER EVERY 4 HOURS AS NEEDED FOR WHEEZING OR SHORTNESS OF BREATH 08/16/20  Yes Myles Lipps, MD  ALPRAZolam Prudy Feeler) 1 MG tablet Take 1 tablet (1 mg total) by mouth 2 (two) times daily as needed for anxiety. 08/19/20  Yes Myles Lipps, MD  amLODipine (NORVASC) 10 MG tablet TAKE 1 TABLET(10 MG) BY MOUTH DAILY 08/19/20  Yes Myles Lipps, MD  Aspirin 81 MG EC tablet Take 81 mg by mouth daily.   Yes [provider]  azelastine (ASTELIN) 0.1 % nasal spray Place 1 spray into both nostrils 2 (two) times daily. Use in each nostril as directed 05/31/20  Yes Glenford Bayley, NP  cetirizine (ZYRTEC) 10 MG tablet Take 10 mg by mouth daily.   Yes [provider]  clobetasol ointment (TEMOVATE) 0.05 % Apply to affected area every night for 4 weeks, then every other day for 4 weeks and  then twice a week for 4 weeks or until resolution. 02/02/20  Yes Constant, Peggy, MD  clotrimazole-betamethasone (LOTRISONE) cream Apply 1 application topically 2 (two) times daily. 08/23/20  Yes Constant, Peggy, MD  demeclocycline (DECLOMYCIN) 300 MG tablet TAKE 1/2 TABLET BY MOUTH TWICE DAILY 10/13/19  Yes Myles LippsSantiago, Irma M, MD  diclofenac sodium (VOLTAREN) 1 % GEL Apply 2 g topically 4 (four) times daily. 06/02/19  Yes Stallings, Zoe A, MD  fluticasone (FLONASE) 50 MCG/ACT nasal spray SHAKE LIQUID AND USE 1 SPRAY IN EACH NOSTRIL TWICE DAILY 04/30/20  Yes Myles LippsSantiago, Irma M, MD  glycerin  adult 2 g suppository Place 1 suppository rectally as needed for constipation. 01/27/19  Yes Myles LippsSantiago, Irma M, MD  guaiFENesin (MUCINEX) 600 MG 12 hr tablet Take 1 tablet (600 mg total) by mouth 2 (two) times daily as needed for to loosen phlegm. 08/19/20  Yes Myles LippsSantiago, Irma M, MD  hydrALAZINE (APRESOLINE) 10 MG tablet Take 1 tablet (10 mg total) by mouth daily as needed (take if BP > 150/90). 08/12/20  Yes Myles LippsSantiago, Irma M, MD  ipratropium (ATROVENT) 0.03 % nasal spray USE 2 SPRAYS IN EACH NOSTRIL TWICE DAILY 04/01/20  Yes Collie SiadStallings, Zoe A, MD  levothyroxine (SYNTHROID) 50 MCG tablet TAKE 1 TABLET(50 MCG) BY MOUTH DAILY BEFORE BREAKFAST 08/19/20  Yes Myles LippsSantiago, Irma M, MD  meclizine (ANTIVERT) 12.5 MG tablet Take 1 tablet (12.5 mg total) by mouth 3 (three) times daily as needed for dizziness. 01/12/20  Yes Myles LippsSantiago, Irma M, MD  olopatadine (PATANOL) 0.1 % ophthalmic solution Place 1 drop into both eyes daily as needed for allergies. 01/28/20  Yes Myles LippsSantiago, Irma M, MD  propranolol ER (INDERAL LA) 60 MG 24 hr capsule TAKE 1 CAPSULE(60 MG) BY MOUTH DAILY 08/19/20  Yes Myles LippsSantiago, Irma M, MD  Respiratory Therapy Supplies (NEBULIZER) DEVI Use with nebulized albuterol solution as needed for wheezing, cough or shortness of breath 08/17/20  Yes Myles LippsSantiago, Irma M, MD  sodium chloride 1 g tablet Take 2 tablets (2 g total) by mouth 2 (two) times daily. 06/29/20 09/27/20 Yes Myles LippsSantiago, Irma M, MD  omeprazole (PRILOSEC) 20 MG capsule Take 1 capsule (20 mg total) by mouth daily AND 2 capsules (40 mg total) at bedtime. 06/20/20 09/18/20  Myles LippsSantiago, Irma M, MD    Past Medical History:  Diagnosis Date  . Anxiety   . Chronic lower back pain   . Constipation    h/o fecal disimpaction on 2017  . CVA (cerebral vascular accident) (HCC) 2015   neg workup, MRI small acute lacunar infarcts. ASA only  . Depression   . Diastolic CHF, chronic (HCC)    grade I, most recent echo Jan 2019  . Fatty liver   . GERD (gastroesophageal reflux  disease)   . Hyperlipidemia   . Hypertension   . Hyponatremia    multiple episodes with encephalopathy, renal thinks SIADH  . Hypothyroidism, unspecified   . Internal hemorrhoid   . NSTEMI (non-ST elevated myocardial infarction) (HCC) 08/28/2016  . Palpitations   . PFO with atrial septal aneurysm    TTE 2015  . Pneumonia 2016  . Severe pulmonary arterial systolic hypertension (HCC) 12/25/2017   per echo, on 1L oxgen via [place]    Past Surgical History:  Procedure Laterality Date  . ABDOMINAL HYSTERECTOMY    . CATARACT EXTRACTION W/ INTRAOCULAR LENS  IMPLANT, BILATERAL Bilateral   . CESAREAN SECTION    . TEE WITHOUT CARDIOVERSION N/A 01/19/2014   Procedure: TRANSESOPHAGEAL ECHOCARDIOGRAM (TEE);  Surgeon: Freida Busmanalton  Alford Highland, MD;  Location: [facility] ENDOSCOPY;  Service: Cardiovascular;  Laterality: N/A;  Kriste Basque Fawn Kirk  . VAGINAL HYSTERECTOMY      Social History   Tobacco Use  . Smoking status: Never Smoker  . Smokeless tobacco: Former Neurosurgeon    Types: Snuff  . Tobacco comment: "used snuff when I was 19"  Substance Use Topics  . Alcohol use: No    Family History  Problem Relation Age of Onset  . Stroke Father   . Hypertension Father   . Colon cancer Cousin   . Colon cancer Other        Aunt  . Diabetes Other        aunt and cousins  . Heart disease Other        cousins  . Healthy Daughter   . Healthy Son     Review of Systems  Constitutional: Positive for malaise/fatigue. Negative for chills and fever.  HENT: Positive for congestion.   Eyes: Negative for blurred vision, double vision and discharge.  Respiratory: Positive for cough and sputum production. Negative for shortness of breath and wheezing.   Cardiovascular: Negative for chest pain, palpitations and leg swelling.  Gastrointestinal: Positive for constipation. Negative for abdominal pain, blood in stool, diarrhea, heartburn, nausea and vomiting.  Genitourinary: Negative for dysuria, frequency and hematuria.    Musculoskeletal: Negative for back pain, falls and joint pain.  Skin: Negative for rash.  Neurological: Positive for dizziness and weakness. Negative for headaches.     OBJECTIVE:  Today's Vitals   09/22/20 1423  BP: (!) 160/68  Pulse: (!) 59  Temp: 98.2 F (36.8 C)  SpO2: 100%  Weight: 157 lb (71.2 kg)  Height: 5\' 2"  (1.575 m)   Body mass index is 28.72 kg/m.   Physical Exam Constitutional:      General: She is not in acute distress.    Appearance: Normal appearance. She is not ill-appearing.  HENT:     Head: Normocephalic and atraumatic.     Right Ear: Tympanic membrane and ear canal normal.     Left Ear: Tympanic membrane and ear canal normal.     Nose: Nose normal.     Mouth/Throat:     Mouth: Mucous membranes are moist.     Pharynx: Oropharynx is clear. No oropharyngeal exudate or posterior oropharyngeal erythema.  Cardiovascular:     Rate and Rhythm: Normal rate and regular rhythm.     Pulses: Normal pulses.     Heart sounds: Normal heart sounds. No murmur heard.  No friction rub. No gallop.   Pulmonary:     Effort: Pulmonary effort is normal. No respiratory distress.     Breath sounds: Normal breath sounds. No stridor. No wheezing, rhonchi or rales.  Abdominal:     General: Bowel sounds are normal.     Palpations: Abdomen is soft.     Tenderness: There is no abdominal tenderness.  Musculoskeletal:     Right lower leg: No edema.     Left lower leg: No edema.  Skin:    General: Skin is warm and dry.  Neurological:     Mental Status: She is alert and oriented to person, place, and time.  Psychiatric:        Mood and Affect: Mood normal.        Behavior: Behavior normal.     No results found for this or any previous visit (from the past 24 hour(s)).  DG Chest 2 View  Result Date: 09/22/2020 CLINICAL  DATA:  Shortness of breath, cough. EXAM: CHEST - 2 VIEW COMPARISON:  Multiple priors, most recent 11/05/2019 FINDINGS: Similar chronic elevation of the  right hemidiaphragm with colonic interposition and chronic atelectasis in the right lower lobe. Similar appearance of chronic atelectasis or scar in the left lung base. No visible pneumothorax or pleural effusion. Cardiomediastinal contour appears similar to prior. Aortic atherosclerosis. Multilevel degenerative change of the spine. IMPRESSION: Similar appearance of the chest. Similar chronic elevation of the right hemidiaphragm with colonic interposition and chronic atelectasis or scar in bilateral lung bases. Electronically Signed   By: Feliberto Harts MD   On: 09/22/2020 15:19     ASSESSMENT and PLAN  Problem List Items Addressed This Visit      Cardiovascular and Mediastinum   Essential hypertension - Primary   Relevant Medications   propranolol ER (INDERAL LA) 60 MG 24 hr capsule   amLODipine (NORVASC) 10 MG tablet   hydrALAZINE (APRESOLINE) 10 MG tablet   Other Relevant Orders   Basic Metabolic Panel   Hemoglobin A1c BP Readings from Last 3 Encounters:  09/22/20 (!) 160/68  08/23/20 (!) 165/75  08/12/20 (!) 151/63   Encouraged to take prn hydralazine      Digestive   GERD (gastroesophageal reflux disease)   Relevant Medications   omeprazole (PRILOSEC) 20 MG capsule   senna (SENOKOT) 8.6 MG TABS tablet   famotidine (PEPCID) 20 MG tablet Stable on current regimen     Endocrine   Hypothyroidism (Chronic)   Relevant Medications   levothyroxine (SYNTHROID) 50 MCG tablet   propranolol ER (INDERAL LA) 60 MG 24 hr capsule Stable on current regimen, will follow up with labs.   Other Relevant Orders   TSH     Nervous and Auditory   Left hemiplegia (HCC)   Parkinson's disease (HCC)     Musculoskeletal and Integument   Decubitus ulcer of sacral region, stage 3 (HCC)     Other   Hyponatremia   Relevant Medications   sodium chloride 1 g tablet Stable on current regimen, will follow up with labs.    Other Visit Diagnoses    Anemia of chronic disease       Relevant  Orders   CBC with Differential   Vitamin D, 25-hydroxy   Dizziness       Cough       Relevant Medications   demeclocycline (DECLOMYCIN) 300 MG tablet   olopatadine (PATANOL) 0.1 % ophthalmic solution   Other Relevant Orders   DG Chest 2 View (Completed) Continue on current regimen on Tylenol, nebulizers and mucinex This seems to be viral in origin Will follow up with lab results    Fungal infection       Relevant Medications   clotrimazole-betamethasone (LOTRISONE) cream   Constipation, unspecified constipation type       Relevant Medications   senna (SENOKOT) 8.6 MG TABS tablet Encouraged to take daily.     Son provided paperwork to be filled out and faxed. Will follow up with lab results. Instructions given to return to clinic sooner if symptoms don't improve. Instructions given to go to the ED with any increased SOB, Chest pain.  Return in about 4 weeks (around 10/20/2020) for follow up.    Macario Carls Armond Cuthrell, FNP-BC Primary Care at [facility] [address] [place], [place] [address] Ph.  [phone] Fax [phone]

## 2020-09-23 ENCOUNTER — Ambulatory Visit: Payer: Self-pay | Admitting: *Deleted

## 2020-09-23 ENCOUNTER — Telehealth: Payer: Self-pay | Admitting: Family Medicine

## 2020-09-23 DIAGNOSIS — Z9981 Dependence on supplemental oxygen: Secondary | ICD-10-CM | POA: Diagnosis not present

## 2020-09-23 DIAGNOSIS — I110 Hypertensive heart disease with heart failure: Secondary | ICD-10-CM | POA: Diagnosis not present

## 2020-09-23 DIAGNOSIS — Z7951 Long term (current) use of inhaled steroids: Secondary | ICD-10-CM | POA: Diagnosis not present

## 2020-09-23 DIAGNOSIS — Z87440 Personal history of urinary (tract) infections: Secondary | ICD-10-CM | POA: Diagnosis not present

## 2020-09-23 DIAGNOSIS — F32A Depression, unspecified: Secondary | ICD-10-CM | POA: Diagnosis not present

## 2020-09-23 DIAGNOSIS — M5459 Other low back pain: Secondary | ICD-10-CM | POA: Diagnosis not present

## 2020-09-23 DIAGNOSIS — D649 Anemia, unspecified: Secondary | ICD-10-CM | POA: Diagnosis not present

## 2020-09-23 DIAGNOSIS — L988 Other specified disorders of the skin and subcutaneous tissue: Secondary | ICD-10-CM | POA: Diagnosis not present

## 2020-09-23 DIAGNOSIS — I2721 Secondary pulmonary arterial hypertension: Secondary | ICD-10-CM | POA: Diagnosis not present

## 2020-09-23 DIAGNOSIS — K222 Esophageal obstruction: Secondary | ICD-10-CM | POA: Diagnosis not present

## 2020-09-23 DIAGNOSIS — K219 Gastro-esophageal reflux disease without esophagitis: Secondary | ICD-10-CM | POA: Diagnosis not present

## 2020-09-23 DIAGNOSIS — E222 Syndrome of inappropriate secretion of antidiuretic hormone: Secondary | ICD-10-CM | POA: Diagnosis not present

## 2020-09-23 DIAGNOSIS — J9611 Chronic respiratory failure with hypoxia: Secondary | ICD-10-CM | POA: Diagnosis not present

## 2020-09-23 DIAGNOSIS — I252 Old myocardial infarction: Secondary | ICD-10-CM | POA: Diagnosis not present

## 2020-09-23 DIAGNOSIS — Z7982 Long term (current) use of aspirin: Secondary | ICD-10-CM | POA: Diagnosis not present

## 2020-09-23 DIAGNOSIS — M7502 Adhesive capsulitis of left shoulder: Secondary | ICD-10-CM | POA: Diagnosis not present

## 2020-09-23 DIAGNOSIS — E039 Hypothyroidism, unspecified: Secondary | ICD-10-CM | POA: Diagnosis not present

## 2020-09-23 DIAGNOSIS — E785 Hyperlipidemia, unspecified: Secondary | ICD-10-CM | POA: Diagnosis not present

## 2020-09-23 DIAGNOSIS — K760 Fatty (change of) liver, not elsewhere classified: Secondary | ICD-10-CM | POA: Diagnosis not present

## 2020-09-23 DIAGNOSIS — I5032 Chronic diastolic (congestive) heart failure: Secondary | ICD-10-CM | POA: Diagnosis not present

## 2020-09-23 DIAGNOSIS — I69391 Dysphagia following cerebral infarction: Secondary | ICD-10-CM | POA: Diagnosis not present

## 2020-09-23 LAB — CBC WITH DIFFERENTIAL/PLATELET
Basophils Absolute: 0 10*3/uL (ref 0.0–0.2)
Basos: 0 %
EOS (ABSOLUTE): 0.2 10*3/uL (ref 0.0–0.4)
Eos: 2 %
Hematocrit: 33.4 % — ABNORMAL LOW (ref 34.0–46.6)
Hemoglobin: 10.2 g/dL — ABNORMAL LOW (ref 11.1–15.9)
Immature Grans (Abs): 0 10*3/uL (ref 0.0–0.1)
Immature Granulocytes: 0 %
Lymphocytes Absolute: 2.9 10*3/uL (ref 0.7–3.1)
Lymphs: 37 %
MCH: 28.3 pg (ref 26.6–33.0)
MCHC: 30.5 g/dL — ABNORMAL LOW (ref 31.5–35.7)
MCV: 93 fL (ref 79–97)
Monocytes Absolute: 0.6 10*3/uL (ref 0.1–0.9)
Monocytes: 8 %
Neutrophils Absolute: 4.1 10*3/uL (ref 1.4–7.0)
Neutrophils: 53 %
Platelets: 235 10*3/uL (ref 150–450)
RBC: 3.6 x10E6/uL — ABNORMAL LOW (ref 3.77–5.28)
RDW: 12.6 % (ref 11.7–15.4)
WBC: 7.8 10*3/uL (ref 3.4–10.8)

## 2020-09-23 LAB — BASIC METABOLIC PANEL
BUN/Creatinine Ratio: 21 (ref 12–28)
BUN: 14 mg/dL (ref 10–36)
CO2: 35 mmol/L — ABNORMAL HIGH (ref 20–29)
Calcium: 9.9 mg/dL (ref 8.7–10.3)
Chloride: 88 mmol/L — ABNORMAL LOW (ref 96–106)
Creatinine, Ser: 0.67 mg/dL (ref 0.57–1.00)
GFR calc Af Amer: 87 mL/min/{1.73_m2} (ref >59)
GFR calc non Af Amer: 75 mL/min/{1.73_m2} (ref >59)
Glucose: 100 mg/dL — ABNORMAL HIGH (ref 65–99)
Potassium: 4.9 mmol/L (ref 3.5–5.2)
Sodium: 135 mmol/L (ref 134–144)

## 2020-09-23 LAB — TSH: TSH: 2.08 u[IU]/mL (ref 0.450–4.500)

## 2020-09-23 LAB — HEMOGLOBIN A1C
Est. average glucose Bld gHb Est-mCnc: 108 mg/dL
Hgb A1c MFr Bld: 5.4 % (ref 4.8–5.6)

## 2020-09-23 LAB — VITAMIN D 25 HYDROXY (VIT D DEFICIENCY, FRACTURES): Vit D, 25-Hydroxy: 32.8 ng/mL (ref 30.0–100.0)

## 2020-09-23 NOTE — Telephone Encounter (Signed)
Patient's son called to request medication for his mother due to cough mucus now yellow to green in color. Patient continues to c/o  Dizziness. Patient's son reports his mother is eating and drinking adequate amounts. Denies SOB, chest pain, fever, room spinning. Care advise given. Patient's son verbalized understanding of care advise and to call back or go to [facility] or ED if symptoms worsen. Please advise if medication can be prescribed due to office visit yesterday.   Reason for Disposition . [1] MILD dizziness (e.g., walking normally) AND [2] has been evaluated by physician for this  Answer Assessment - Initial Assessment Questions 1. DESCRIPTION: "Describe your dizziness."     Dizziness noted with bathing this am and standing  2. LIGHTHEADED: "Do you feel lightheaded?" (e.g., somewhat faint, woozy, weak upon standing)     lightheaded 3. VERTIGO: "Do you feel like either you or the room is spinning or tilting?" (i.e. vertigo)     No  4. SEVERITY: "How bad is it?"  "Do you feel like you are going to faint?" "Can you stand and walk?"   - MILD: Feels slightly dizzy, but walking normally.   - MODERATE: Feels very unsteady when walking, but not falling; interferes with normal activities (e.g., school, work) .   - SEVERE: Unable to walk without falling, or requires assistance to walk without falling; feels like passing out now.      Mild  5. ONSET:  "When did the dizziness begin?"     Prior to today , seen in office visit yesterday  6. AGGRAVATING FACTORS: "Does anything make it worse?" (e.g., standing, change in head position)     Standing  7. HEART RATE: "Can you tell me your heart rate?" "How many beats in 15 seconds?"  (Note: not all patients can do this)       na 8. CAUSE: "What do you think is causing the dizziness?"     Because she has cough and sinus issues  9. RECURRENT SYMPTOM: "Have you had dizziness before?" If Yes, ask: "When was the last time?" "What happened that time?"     no 10.  OTHER SYMPTOMS: "Do you have any other symptoms?" (e.g., fever, chest pain, vomiting, diarrhea, bleeding)       Coughing up yellow to green colored mucus  11. PREGNANCY: "Is there any chance you are pregnant?" "When was your last menstrual period?"       na  Protocols used: DIZZINESS [facility]

## 2020-09-23 NOTE — Telephone Encounter (Signed)
Pt's son spoke with Arlys John, son understands that labs were normal for the pt and there is no reason to give antibiotics. A message has been sent to Just to review request

## 2020-09-23 NOTE — Telephone Encounter (Signed)
Patient was last seen 09/22/2020 by Just, Azalee Course, FNP . Patient is coughing up yellow/green phlegm, requesting antibiotics and would like Rx sent in today. Caller was transferred to [facility] Nurse Triage due to the dizziness not improving. Please follow up with patient today regarding Rx     [facility][id number] [place], [place] - [address] AT [facility] [address] [facility] [address] Phone:  [phone]  Fax:  [phone]

## 2020-09-26 DIAGNOSIS — I110 Hypertensive heart disease with heart failure: Secondary | ICD-10-CM | POA: Diagnosis not present

## 2020-09-26 DIAGNOSIS — D649 Anemia, unspecified: Secondary | ICD-10-CM | POA: Diagnosis not present

## 2020-09-26 DIAGNOSIS — L988 Other specified disorders of the skin and subcutaneous tissue: Secondary | ICD-10-CM | POA: Diagnosis not present

## 2020-09-26 DIAGNOSIS — I252 Old myocardial infarction: Secondary | ICD-10-CM | POA: Diagnosis not present

## 2020-09-26 DIAGNOSIS — I69391 Dysphagia following cerebral infarction: Secondary | ICD-10-CM | POA: Diagnosis not present

## 2020-09-26 DIAGNOSIS — I5032 Chronic diastolic (congestive) heart failure: Secondary | ICD-10-CM | POA: Diagnosis not present

## 2020-09-26 DIAGNOSIS — E222 Syndrome of inappropriate secretion of antidiuretic hormone: Secondary | ICD-10-CM | POA: Diagnosis not present

## 2020-09-26 DIAGNOSIS — F32A Depression, unspecified: Secondary | ICD-10-CM | POA: Diagnosis not present

## 2020-09-26 DIAGNOSIS — K222 Esophageal obstruction: Secondary | ICD-10-CM | POA: Diagnosis not present

## 2020-09-26 DIAGNOSIS — I2721 Secondary pulmonary arterial hypertension: Secondary | ICD-10-CM | POA: Diagnosis not present

## 2020-09-26 DIAGNOSIS — Z7951 Long term (current) use of inhaled steroids: Secondary | ICD-10-CM | POA: Diagnosis not present

## 2020-09-26 DIAGNOSIS — Z9981 Dependence on supplemental oxygen: Secondary | ICD-10-CM | POA: Diagnosis not present

## 2020-09-26 DIAGNOSIS — M5459 Other low back pain: Secondary | ICD-10-CM | POA: Diagnosis not present

## 2020-09-26 DIAGNOSIS — Z7982 Long term (current) use of aspirin: Secondary | ICD-10-CM | POA: Diagnosis not present

## 2020-09-26 DIAGNOSIS — E039 Hypothyroidism, unspecified: Secondary | ICD-10-CM | POA: Diagnosis not present

## 2020-09-26 DIAGNOSIS — K219 Gastro-esophageal reflux disease without esophagitis: Secondary | ICD-10-CM | POA: Diagnosis not present

## 2020-09-26 DIAGNOSIS — M7502 Adhesive capsulitis of left shoulder: Secondary | ICD-10-CM | POA: Diagnosis not present

## 2020-09-26 DIAGNOSIS — J9611 Chronic respiratory failure with hypoxia: Secondary | ICD-10-CM | POA: Diagnosis not present

## 2020-09-26 DIAGNOSIS — Z87440 Personal history of urinary (tract) infections: Secondary | ICD-10-CM | POA: Diagnosis not present

## 2020-09-26 DIAGNOSIS — K760 Fatty (change of) liver, not elsewhere classified: Secondary | ICD-10-CM | POA: Diagnosis not present

## 2020-09-26 DIAGNOSIS — E785 Hyperlipidemia, unspecified: Secondary | ICD-10-CM | POA: Diagnosis not present

## 2020-09-27 DIAGNOSIS — K219 Gastro-esophageal reflux disease without esophagitis: Secondary | ICD-10-CM | POA: Diagnosis not present

## 2020-09-27 DIAGNOSIS — I5032 Chronic diastolic (congestive) heart failure: Secondary | ICD-10-CM | POA: Diagnosis not present

## 2020-09-27 DIAGNOSIS — K760 Fatty (change of) liver, not elsewhere classified: Secondary | ICD-10-CM | POA: Diagnosis not present

## 2020-09-27 DIAGNOSIS — Z7951 Long term (current) use of inhaled steroids: Secondary | ICD-10-CM | POA: Diagnosis not present

## 2020-09-27 DIAGNOSIS — L988 Other specified disorders of the skin and subcutaneous tissue: Secondary | ICD-10-CM | POA: Diagnosis not present

## 2020-09-27 DIAGNOSIS — K222 Esophageal obstruction: Secondary | ICD-10-CM | POA: Diagnosis not present

## 2020-09-27 DIAGNOSIS — E785 Hyperlipidemia, unspecified: Secondary | ICD-10-CM | POA: Diagnosis not present

## 2020-09-27 DIAGNOSIS — Z87440 Personal history of urinary (tract) infections: Secondary | ICD-10-CM | POA: Diagnosis not present

## 2020-09-27 DIAGNOSIS — J9611 Chronic respiratory failure with hypoxia: Secondary | ICD-10-CM | POA: Diagnosis not present

## 2020-09-27 DIAGNOSIS — E039 Hypothyroidism, unspecified: Secondary | ICD-10-CM | POA: Diagnosis not present

## 2020-09-27 DIAGNOSIS — E222 Syndrome of inappropriate secretion of antidiuretic hormone: Secondary | ICD-10-CM | POA: Diagnosis not present

## 2020-09-27 DIAGNOSIS — I252 Old myocardial infarction: Secondary | ICD-10-CM | POA: Diagnosis not present

## 2020-09-27 DIAGNOSIS — Z7982 Long term (current) use of aspirin: Secondary | ICD-10-CM | POA: Diagnosis not present

## 2020-09-27 DIAGNOSIS — I69391 Dysphagia following cerebral infarction: Secondary | ICD-10-CM | POA: Diagnosis not present

## 2020-09-27 DIAGNOSIS — Z9981 Dependence on supplemental oxygen: Secondary | ICD-10-CM | POA: Diagnosis not present

## 2020-09-27 DIAGNOSIS — I110 Hypertensive heart disease with heart failure: Secondary | ICD-10-CM | POA: Diagnosis not present

## 2020-09-27 DIAGNOSIS — F32A Depression, unspecified: Secondary | ICD-10-CM | POA: Diagnosis not present

## 2020-09-27 DIAGNOSIS — I2721 Secondary pulmonary arterial hypertension: Secondary | ICD-10-CM | POA: Diagnosis not present

## 2020-09-27 DIAGNOSIS — D649 Anemia, unspecified: Secondary | ICD-10-CM | POA: Diagnosis not present

## 2020-09-27 DIAGNOSIS — M5459 Other low back pain: Secondary | ICD-10-CM | POA: Diagnosis not present

## 2020-09-27 DIAGNOSIS — M7502 Adhesive capsulitis of left shoulder: Secondary | ICD-10-CM | POA: Diagnosis not present

## 2020-09-29 DIAGNOSIS — E039 Hypothyroidism, unspecified: Secondary | ICD-10-CM | POA: Diagnosis not present

## 2020-09-29 DIAGNOSIS — D649 Anemia, unspecified: Secondary | ICD-10-CM | POA: Diagnosis not present

## 2020-09-29 DIAGNOSIS — K219 Gastro-esophageal reflux disease without esophagitis: Secondary | ICD-10-CM | POA: Diagnosis not present

## 2020-09-29 DIAGNOSIS — Z87440 Personal history of urinary (tract) infections: Secondary | ICD-10-CM | POA: Diagnosis not present

## 2020-09-29 DIAGNOSIS — L988 Other specified disorders of the skin and subcutaneous tissue: Secondary | ICD-10-CM | POA: Diagnosis not present

## 2020-09-29 DIAGNOSIS — Z7951 Long term (current) use of inhaled steroids: Secondary | ICD-10-CM | POA: Diagnosis not present

## 2020-09-29 DIAGNOSIS — I2721 Secondary pulmonary arterial hypertension: Secondary | ICD-10-CM | POA: Diagnosis not present

## 2020-09-29 DIAGNOSIS — I69391 Dysphagia following cerebral infarction: Secondary | ICD-10-CM | POA: Diagnosis not present

## 2020-09-29 DIAGNOSIS — E785 Hyperlipidemia, unspecified: Secondary | ICD-10-CM | POA: Diagnosis not present

## 2020-09-29 DIAGNOSIS — Z9981 Dependence on supplemental oxygen: Secondary | ICD-10-CM | POA: Diagnosis not present

## 2020-09-29 DIAGNOSIS — Z7982 Long term (current) use of aspirin: Secondary | ICD-10-CM | POA: Diagnosis not present

## 2020-09-29 DIAGNOSIS — M5459 Other low back pain: Secondary | ICD-10-CM | POA: Diagnosis not present

## 2020-09-29 DIAGNOSIS — I5032 Chronic diastolic (congestive) heart failure: Secondary | ICD-10-CM | POA: Diagnosis not present

## 2020-09-29 DIAGNOSIS — I110 Hypertensive heart disease with heart failure: Secondary | ICD-10-CM | POA: Diagnosis not present

## 2020-09-29 DIAGNOSIS — K222 Esophageal obstruction: Secondary | ICD-10-CM | POA: Diagnosis not present

## 2020-09-29 DIAGNOSIS — I252 Old myocardial infarction: Secondary | ICD-10-CM | POA: Diagnosis not present

## 2020-09-29 DIAGNOSIS — M7502 Adhesive capsulitis of left shoulder: Secondary | ICD-10-CM | POA: Diagnosis not present

## 2020-09-29 DIAGNOSIS — K760 Fatty (change of) liver, not elsewhere classified: Secondary | ICD-10-CM | POA: Diagnosis not present

## 2020-09-29 DIAGNOSIS — J9611 Chronic respiratory failure with hypoxia: Secondary | ICD-10-CM | POA: Diagnosis not present

## 2020-09-29 DIAGNOSIS — E222 Syndrome of inappropriate secretion of antidiuretic hormone: Secondary | ICD-10-CM | POA: Diagnosis not present

## 2020-09-29 DIAGNOSIS — F32A Depression, unspecified: Secondary | ICD-10-CM | POA: Diagnosis not present

## 2020-09-30 DIAGNOSIS — E785 Hyperlipidemia, unspecified: Secondary | ICD-10-CM | POA: Diagnosis not present

## 2020-09-30 DIAGNOSIS — B9562 Methicillin resistant Staphylococcus aureus infection as the cause of diseases classified elsewhere: Secondary | ICD-10-CM | POA: Diagnosis not present

## 2020-09-30 DIAGNOSIS — Z9981 Dependence on supplemental oxygen: Secondary | ICD-10-CM | POA: Diagnosis not present

## 2020-09-30 DIAGNOSIS — F32A Depression, unspecified: Secondary | ICD-10-CM | POA: Diagnosis not present

## 2020-09-30 DIAGNOSIS — M5459 Other low back pain: Secondary | ICD-10-CM | POA: Diagnosis not present

## 2020-09-30 DIAGNOSIS — I110 Hypertensive heart disease with heart failure: Secondary | ICD-10-CM | POA: Diagnosis not present

## 2020-09-30 DIAGNOSIS — Z7951 Long term (current) use of inhaled steroids: Secondary | ICD-10-CM | POA: Diagnosis not present

## 2020-09-30 DIAGNOSIS — I2721 Secondary pulmonary arterial hypertension: Secondary | ICD-10-CM | POA: Diagnosis not present

## 2020-09-30 DIAGNOSIS — L988 Other specified disorders of the skin and subcutaneous tissue: Secondary | ICD-10-CM | POA: Diagnosis not present

## 2020-09-30 DIAGNOSIS — Z7982 Long term (current) use of aspirin: Secondary | ICD-10-CM | POA: Diagnosis not present

## 2020-09-30 DIAGNOSIS — R7881 Bacteremia: Secondary | ICD-10-CM | POA: Diagnosis not present

## 2020-09-30 DIAGNOSIS — I6789 Other cerebrovascular disease: Secondary | ICD-10-CM | POA: Diagnosis not present

## 2020-09-30 DIAGNOSIS — D649 Anemia, unspecified: Secondary | ICD-10-CM | POA: Diagnosis not present

## 2020-09-30 DIAGNOSIS — I252 Old myocardial infarction: Secondary | ICD-10-CM | POA: Diagnosis not present

## 2020-09-30 DIAGNOSIS — E222 Syndrome of inappropriate secretion of antidiuretic hormone: Secondary | ICD-10-CM | POA: Diagnosis not present

## 2020-09-30 DIAGNOSIS — K760 Fatty (change of) liver, not elsewhere classified: Secondary | ICD-10-CM | POA: Diagnosis not present

## 2020-09-30 DIAGNOSIS — I5032 Chronic diastolic (congestive) heart failure: Secondary | ICD-10-CM | POA: Diagnosis not present

## 2020-09-30 DIAGNOSIS — K219 Gastro-esophageal reflux disease without esophagitis: Secondary | ICD-10-CM | POA: Diagnosis not present

## 2020-09-30 DIAGNOSIS — J9611 Chronic respiratory failure with hypoxia: Secondary | ICD-10-CM | POA: Diagnosis not present

## 2020-09-30 DIAGNOSIS — Z87440 Personal history of urinary (tract) infections: Secondary | ICD-10-CM | POA: Diagnosis not present

## 2020-09-30 DIAGNOSIS — M7502 Adhesive capsulitis of left shoulder: Secondary | ICD-10-CM | POA: Diagnosis not present

## 2020-09-30 DIAGNOSIS — J9601 Acute respiratory failure with hypoxia: Secondary | ICD-10-CM | POA: Diagnosis not present

## 2020-09-30 DIAGNOSIS — I69391 Dysphagia following cerebral infarction: Secondary | ICD-10-CM | POA: Diagnosis not present

## 2020-09-30 DIAGNOSIS — K222 Esophageal obstruction: Secondary | ICD-10-CM | POA: Diagnosis not present

## 2020-09-30 DIAGNOSIS — E039 Hypothyroidism, unspecified: Secondary | ICD-10-CM | POA: Diagnosis not present

## 2020-10-02 DIAGNOSIS — L89316 Pressure-induced deep tissue damage of right buttock: Secondary | ICD-10-CM | POA: Diagnosis not present

## 2020-10-03 DIAGNOSIS — D649 Anemia, unspecified: Secondary | ICD-10-CM | POA: Diagnosis not present

## 2020-10-03 DIAGNOSIS — Z87440 Personal history of urinary (tract) infections: Secondary | ICD-10-CM | POA: Diagnosis not present

## 2020-10-03 DIAGNOSIS — L988 Other specified disorders of the skin and subcutaneous tissue: Secondary | ICD-10-CM | POA: Diagnosis not present

## 2020-10-03 DIAGNOSIS — Z7982 Long term (current) use of aspirin: Secondary | ICD-10-CM | POA: Diagnosis not present

## 2020-10-03 DIAGNOSIS — I2721 Secondary pulmonary arterial hypertension: Secondary | ICD-10-CM | POA: Diagnosis not present

## 2020-10-03 DIAGNOSIS — I69391 Dysphagia following cerebral infarction: Secondary | ICD-10-CM | POA: Diagnosis not present

## 2020-10-03 DIAGNOSIS — I5032 Chronic diastolic (congestive) heart failure: Secondary | ICD-10-CM | POA: Diagnosis not present

## 2020-10-03 DIAGNOSIS — I252 Old myocardial infarction: Secondary | ICD-10-CM | POA: Diagnosis not present

## 2020-10-03 DIAGNOSIS — F32A Depression, unspecified: Secondary | ICD-10-CM | POA: Diagnosis not present

## 2020-10-03 DIAGNOSIS — L89316 Pressure-induced deep tissue damage of right buttock: Secondary | ICD-10-CM | POA: Diagnosis not present

## 2020-10-03 DIAGNOSIS — E039 Hypothyroidism, unspecified: Secondary | ICD-10-CM | POA: Diagnosis not present

## 2020-10-03 DIAGNOSIS — K760 Fatty (change of) liver, not elsewhere classified: Secondary | ICD-10-CM | POA: Diagnosis not present

## 2020-10-03 DIAGNOSIS — J9611 Chronic respiratory failure with hypoxia: Secondary | ICD-10-CM | POA: Diagnosis not present

## 2020-10-03 DIAGNOSIS — E222 Syndrome of inappropriate secretion of antidiuretic hormone: Secondary | ICD-10-CM | POA: Diagnosis not present

## 2020-10-03 DIAGNOSIS — K222 Esophageal obstruction: Secondary | ICD-10-CM | POA: Diagnosis not present

## 2020-10-03 DIAGNOSIS — M5459 Other low back pain: Secondary | ICD-10-CM | POA: Diagnosis not present

## 2020-10-03 DIAGNOSIS — Z7951 Long term (current) use of inhaled steroids: Secondary | ICD-10-CM | POA: Diagnosis not present

## 2020-10-03 DIAGNOSIS — K219 Gastro-esophageal reflux disease without esophagitis: Secondary | ICD-10-CM | POA: Diagnosis not present

## 2020-10-03 DIAGNOSIS — E785 Hyperlipidemia, unspecified: Secondary | ICD-10-CM | POA: Diagnosis not present

## 2020-10-03 DIAGNOSIS — M7502 Adhesive capsulitis of left shoulder: Secondary | ICD-10-CM | POA: Diagnosis not present

## 2020-10-03 DIAGNOSIS — Z9981 Dependence on supplemental oxygen: Secondary | ICD-10-CM | POA: Diagnosis not present

## 2020-10-03 DIAGNOSIS — I110 Hypertensive heart disease with heart failure: Secondary | ICD-10-CM | POA: Diagnosis not present

## 2020-10-05 ENCOUNTER — Encounter: Payer: Self-pay | Admitting: Pulmonary Disease

## 2020-10-05 ENCOUNTER — Ambulatory Visit (INDEPENDENT_AMBULATORY_CARE_PROVIDER_SITE_OTHER): Payer: Medicare Other | Admitting: Pulmonary Disease

## 2020-10-05 ENCOUNTER — Other Ambulatory Visit: Payer: Self-pay

## 2020-10-05 VITALS — BP 140/84 | HR 57 | Temp 97.3°F | Ht 62.0 in | Wt 157.0 lb

## 2020-10-05 DIAGNOSIS — Z9981 Dependence on supplemental oxygen: Secondary | ICD-10-CM | POA: Diagnosis not present

## 2020-10-05 DIAGNOSIS — I252 Old myocardial infarction: Secondary | ICD-10-CM | POA: Diagnosis not present

## 2020-10-05 DIAGNOSIS — I5032 Chronic diastolic (congestive) heart failure: Secondary | ICD-10-CM | POA: Diagnosis not present

## 2020-10-05 DIAGNOSIS — I2721 Secondary pulmonary arterial hypertension: Secondary | ICD-10-CM | POA: Diagnosis not present

## 2020-10-05 DIAGNOSIS — Z9989 Dependence on other enabling machines and devices: Secondary | ICD-10-CM

## 2020-10-05 DIAGNOSIS — I69391 Dysphagia following cerebral infarction: Secondary | ICD-10-CM | POA: Diagnosis not present

## 2020-10-05 DIAGNOSIS — G4733 Obstructive sleep apnea (adult) (pediatric): Secondary | ICD-10-CM | POA: Diagnosis not present

## 2020-10-05 DIAGNOSIS — Z7951 Long term (current) use of inhaled steroids: Secondary | ICD-10-CM | POA: Diagnosis not present

## 2020-10-05 DIAGNOSIS — L988 Other specified disorders of the skin and subcutaneous tissue: Secondary | ICD-10-CM | POA: Diagnosis not present

## 2020-10-05 DIAGNOSIS — E222 Syndrome of inappropriate secretion of antidiuretic hormone: Secondary | ICD-10-CM | POA: Diagnosis not present

## 2020-10-05 DIAGNOSIS — F32A Depression, unspecified: Secondary | ICD-10-CM | POA: Diagnosis not present

## 2020-10-05 DIAGNOSIS — K219 Gastro-esophageal reflux disease without esophagitis: Secondary | ICD-10-CM | POA: Diagnosis not present

## 2020-10-05 DIAGNOSIS — J9611 Chronic respiratory failure with hypoxia: Secondary | ICD-10-CM

## 2020-10-05 DIAGNOSIS — K222 Esophageal obstruction: Secondary | ICD-10-CM | POA: Diagnosis not present

## 2020-10-05 DIAGNOSIS — K760 Fatty (change of) liver, not elsewhere classified: Secondary | ICD-10-CM | POA: Diagnosis not present

## 2020-10-05 DIAGNOSIS — M7502 Adhesive capsulitis of left shoulder: Secondary | ICD-10-CM | POA: Diagnosis not present

## 2020-10-05 DIAGNOSIS — I110 Hypertensive heart disease with heart failure: Secondary | ICD-10-CM | POA: Diagnosis not present

## 2020-10-05 DIAGNOSIS — Z7982 Long term (current) use of aspirin: Secondary | ICD-10-CM | POA: Diagnosis not present

## 2020-10-05 DIAGNOSIS — D649 Anemia, unspecified: Secondary | ICD-10-CM | POA: Diagnosis not present

## 2020-10-05 DIAGNOSIS — Z87440 Personal history of urinary (tract) infections: Secondary | ICD-10-CM | POA: Diagnosis not present

## 2020-10-05 DIAGNOSIS — E039 Hypothyroidism, unspecified: Secondary | ICD-10-CM | POA: Diagnosis not present

## 2020-10-05 DIAGNOSIS — M5459 Other low back pain: Secondary | ICD-10-CM | POA: Diagnosis not present

## 2020-10-05 DIAGNOSIS — E785 Hyperlipidemia, unspecified: Secondary | ICD-10-CM | POA: Diagnosis not present

## 2020-10-05 NOTE — Patient Instructions (Signed)
Obtain download on trilogy from adapt. Prescription for new AirFit N 30 nasal cradle mask -to see if we can get you more comfortable. Try to use the machine 6 hours every night  If not able, then call us and we can consider discontinuing trilogy machine and using only oxygen during sleep  Signs of increased carbon dioxide level would be confusion, increase sleepiness, increased swelling of your feet

## 2020-10-05 NOTE — Assessment & Plan Note (Signed)
She has never had sleep studies, she was provided with a trilogy machine after hospitalization for hypercarbic respiratory failure 01/2019.  She has problems using the full facemask. We will provide her with a nasal cradle mask and see if she can be more comfortable.  If not, then we need to have a discussion whether she really needs the trilogy or not.  This would be a quality of life issue for her and we can consider discontinuing NIV if this continues to be a burden I have discussed this with her and her son [name] today. Defer further care to her primary pulmonologist depending on how she does with the mask change

## 2020-10-05 NOTE — Progress Notes (Signed)
Subjective:    Patient ID: Tammy Fernandez, female    DOB: 1924-07-12, 86 y.o.   MRN: [id number]  HPI  86 year old woman with pulmonary hypertension chronic hypoxic respiratory failure referred for evaluation of sleep apnea She is accompanied by her son Tammy Fernandez who reports that she does not have sleep apnea, the problem seems to be elevated, Level  PMh - pulmonary HTN, diastolic HF, HTN, NSTEMI, aspiration pneumonia, chronic respiratory failure, GERD, hypothyroidism, SIADH  Last office visit 05/2020 reviewed, oxygen saturation was 88% on room air and she qualified for oxygen improved to 1.5 to 2 L.  I have reviewed prior hospitalization and discharge summary.  Seems like this patient does not carry a diagnosis of OSA although this has been noted multiple times by previous providers.  She was provided with noninvasive ventilation/trilogy machine after hospitalization February 2020 for  acute hypercarbic respiratory failure .  She has been maintained on NIV since then.  She reports being frustrated with the machine, she does not like the full facemask she has a productive cough and has to spit into her machine and cannot tolerate the full facemask. She has a cough for 3 weeks with clear mucus, Covid testing was negative She is still trying to be compliant with her machine in spite of these issues  Epworth sleepiness score is 8. Bedtime is between 84 and 11 PM, sleep latency is 20 to 30 minutes, reports 1-2 nocturnal awakenings and is out of bed at 8 AM feeling tired without dryness of mouth or headaches  09/2020 bicarbonate 35 Serial chest x-rays reviewed which show chronic right elevated hemidiaphragm which dates back to 2012  Significant tests/ events reviewed ABG 01/2019 7.5 1/50/108 on 3 L nasal cannula  Echo 12/2017 PA pressure 72, grade 2 diastolic dysfunction, moderate TR   Past Medical History:  Diagnosis Date  . Anxiety   . Chronic lower back pain   . Constipation    h/o fecal  disimpaction on 2017  . CVA (cerebral vascular accident) (HCC) 2015   neg workup, MRI small acute lacunar infarcts. ASA only  . Depression   . Diastolic CHF, chronic (HCC)    grade I, most recent echo Jan 2019  . Fatty liver   . GERD (gastroesophageal reflux disease)   . Hyperlipidemia   . Hypertension   . Hyponatremia    multiple episodes with encephalopathy, renal thinks SIADH  . Hypothyroidism, unspecified   . Internal hemorrhoid   . NSTEMI (non-ST elevated myocardial infarction) (HCC) 08/28/2016  . Palpitations   . PFO with atrial septal aneurysm    TTE 2015  . Pneumonia 2016  . Severe pulmonary arterial systolic hypertension (HCC) 12/25/2017   per echo, on 1L oxgen via [place]    Past Surgical History:  Procedure Laterality Date  . ABDOMINAL HYSTERECTOMY    . CATARACT EXTRACTION W/ INTRAOCULAR LENS  IMPLANT, BILATERAL Bilateral   . CESAREAN SECTION    . TEE WITHOUT CARDIOVERSION N/A 01/19/2014   Procedure: TRANSESOPHAGEAL ECHOCARDIOGRAM (TEE);  Surgeon: Laurey Morale, MD;  Location: [facility] ENDOSCOPY;  Service: Cardiovascular;  Laterality: N/A;  [name]  . VAGINAL HYSTERECTOMY     Allergies  Allergen Reactions  . Crestor [Rosuvastatin Calcium] Other (See Comments)    Muscle Aches  . Keflex [Cephalexin] Other (See Comments)    dizziness  . Peanut Butter Flavor Hives  . Sulfa Antibiotics Hives  . Cephalexin Other (See Comments)    dizziness  . Sulfa Drugs Cross Reactors Rash  Social History   Socioeconomic History  . Marital status: Widowed    Spouse name: Not on file  . Number of children: 7  . Years of education: Not on file  . Highest education level: Not on file  Occupational History  . Not on file  Tobacco Use  . Smoking status: Never Smoker  . Smokeless tobacco: Former Neurosurgeon    Types: Snuff  . Tobacco comment: "used snuff when I was 19"  Vaping Use  . Vaping Use: Never used  Substance and Sexual Activity  . Alcohol use: No  . Drug use: No  . Sexual  activity: Not Currently  Other Topics Concern  . Not on file  Social History Narrative   ** Merged History Encounter **       Patient lives at home with her son         Social Determinants of Health   Financial Resource Strain:   . Difficulty of Paying Living Expenses: Not on file  Food Insecurity: No Food Insecurity  . Worried About Programme researcher, broadcasting/film/video in the Last Year: Never true  . Ran Out of Food in the Last Year: Never true  Transportation Needs: No Transportation Needs  . Lack of Transportation (Medical): No  . Lack of Transportation (Non-Medical): No  Physical Activity:   . Days of Exercise per Week: Not on file  . Minutes of Exercise per Session: Not on file  Stress:   . Feeling of Stress : Not on file  Social Connections:   . Frequency of Communication with Friends and Family: Not on file  . Frequency of Social Gatherings with Friends and Family: Not on file  . Attends Religious Services: Not on file  . Active Member of Clubs or Organizations: Not on file  . Attends Banker Meetings: Not on file  . Marital Status: Not on file  Intimate Partner Violence:   . Fear of Current or Ex-Partner: Not on file  . Emotionally Abused: Not on file  . Physically Abused: Not on file  . Sexually Abused: Not on file    Family History  Problem Relation Age of Onset  . Stroke Father   . Hypertension Father   . Colon cancer Cousin   . Colon cancer Other        Aunt  . Diabetes Other        aunt and cousins  . Heart disease Other        cousins  . Healthy Daughter   . Healthy Son      Review of Systems  NYHA class III dyspnea, arrives in a wheelchair today  No chest pain, palpitations 1+ pedal edema We will do sleep on 2 pillows. Nocturia + Cough, minimal white phlegm, no wheezing      Objective:   Physical Exam  Gen:      elderly woman, no distress , wheelchair, glasses, wearing HEENT:  EOMI, sclera anicteric, mild pallor Neck:     No JVD; no  thyromegaly Lungs:    Mild kyphosis, decreased breath sounds on right CV:         Regular rate and rhythm; no murmurs Abd:      + bowel sounds; soft, non-tender; no palpable masses, no distension Ext:    No edema; adequate peripheral perfusion Skin:      Warm and dry; no rash Neuro: alert and oriented x 3       Assessment & Plan:

## 2020-10-05 NOTE — Assessment & Plan Note (Signed)
She has chronic hypoxic and hypercarbic respiratory failure She is on trilogy machine since 01/2019 after hospitalization for hypercarbic respiratory failure. I believe this is on the basis of chronic elevated right hemidiaphragm which dates back many years.  She is a never smoker so I doubt that she has COPD, she does not have any evidence of upper airway obstruction  She also has hypoxia which is likely on the basis of pulmonary hypertension and is worse only during exertion.  She has been qualified for oxygen and is maintained on 1.5 L

## 2020-10-06 ENCOUNTER — Telehealth: Payer: Self-pay | Admitting: Pulmonary Disease

## 2020-10-06 DIAGNOSIS — E222 Syndrome of inappropriate secretion of antidiuretic hormone: Secondary | ICD-10-CM | POA: Diagnosis not present

## 2020-10-06 DIAGNOSIS — J9611 Chronic respiratory failure with hypoxia: Secondary | ICD-10-CM | POA: Diagnosis not present

## 2020-10-06 DIAGNOSIS — Z7982 Long term (current) use of aspirin: Secondary | ICD-10-CM | POA: Diagnosis not present

## 2020-10-06 DIAGNOSIS — M5459 Other low back pain: Secondary | ICD-10-CM | POA: Diagnosis not present

## 2020-10-06 DIAGNOSIS — I69391 Dysphagia following cerebral infarction: Secondary | ICD-10-CM | POA: Diagnosis not present

## 2020-10-06 DIAGNOSIS — I5032 Chronic diastolic (congestive) heart failure: Secondary | ICD-10-CM | POA: Diagnosis not present

## 2020-10-06 DIAGNOSIS — E039 Hypothyroidism, unspecified: Secondary | ICD-10-CM | POA: Diagnosis not present

## 2020-10-06 DIAGNOSIS — Z7951 Long term (current) use of inhaled steroids: Secondary | ICD-10-CM | POA: Diagnosis not present

## 2020-10-06 DIAGNOSIS — K222 Esophageal obstruction: Secondary | ICD-10-CM | POA: Diagnosis not present

## 2020-10-06 DIAGNOSIS — D649 Anemia, unspecified: Secondary | ICD-10-CM | POA: Diagnosis not present

## 2020-10-06 DIAGNOSIS — E785 Hyperlipidemia, unspecified: Secondary | ICD-10-CM | POA: Diagnosis not present

## 2020-10-06 DIAGNOSIS — I252 Old myocardial infarction: Secondary | ICD-10-CM | POA: Diagnosis not present

## 2020-10-06 DIAGNOSIS — L988 Other specified disorders of the skin and subcutaneous tissue: Secondary | ICD-10-CM | POA: Diagnosis not present

## 2020-10-06 DIAGNOSIS — Z9981 Dependence on supplemental oxygen: Secondary | ICD-10-CM | POA: Diagnosis not present

## 2020-10-06 DIAGNOSIS — I2721 Secondary pulmonary arterial hypertension: Secondary | ICD-10-CM | POA: Diagnosis not present

## 2020-10-06 DIAGNOSIS — K219 Gastro-esophageal reflux disease without esophagitis: Secondary | ICD-10-CM | POA: Diagnosis not present

## 2020-10-06 DIAGNOSIS — Z87440 Personal history of urinary (tract) infections: Secondary | ICD-10-CM | POA: Diagnosis not present

## 2020-10-06 DIAGNOSIS — I110 Hypertensive heart disease with heart failure: Secondary | ICD-10-CM | POA: Diagnosis not present

## 2020-10-06 DIAGNOSIS — M7502 Adhesive capsulitis of left shoulder: Secondary | ICD-10-CM | POA: Diagnosis not present

## 2020-10-06 DIAGNOSIS — K760 Fatty (change of) liver, not elsewhere classified: Secondary | ICD-10-CM | POA: Diagnosis not present

## 2020-10-06 DIAGNOSIS — F32A Depression, unspecified: Secondary | ICD-10-CM | POA: Diagnosis not present

## 2020-10-06 NOTE — Telephone Encounter (Signed)
Spoke with Melissa with [employer]  She states that the pt is having trouble knocking her machine off at night and therefore not much information  They are bringing her in for a download and will schedule one to be done every month thereafter  Will forward to Dr Vassie Loll to let him know

## 2020-10-07 ENCOUNTER — Ambulatory Visit: Payer: Medicare Other | Admitting: Internal Medicine

## 2020-10-07 DIAGNOSIS — M7502 Adhesive capsulitis of left shoulder: Secondary | ICD-10-CM | POA: Diagnosis not present

## 2020-10-07 DIAGNOSIS — K219 Gastro-esophageal reflux disease without esophagitis: Secondary | ICD-10-CM | POA: Diagnosis not present

## 2020-10-07 DIAGNOSIS — E785 Hyperlipidemia, unspecified: Secondary | ICD-10-CM | POA: Diagnosis not present

## 2020-10-07 DIAGNOSIS — Z9981 Dependence on supplemental oxygen: Secondary | ICD-10-CM | POA: Diagnosis not present

## 2020-10-07 DIAGNOSIS — I5032 Chronic diastolic (congestive) heart failure: Secondary | ICD-10-CM | POA: Diagnosis not present

## 2020-10-07 DIAGNOSIS — I69391 Dysphagia following cerebral infarction: Secondary | ICD-10-CM | POA: Diagnosis not present

## 2020-10-07 DIAGNOSIS — M5459 Other low back pain: Secondary | ICD-10-CM | POA: Diagnosis not present

## 2020-10-07 DIAGNOSIS — I252 Old myocardial infarction: Secondary | ICD-10-CM | POA: Diagnosis not present

## 2020-10-07 DIAGNOSIS — E039 Hypothyroidism, unspecified: Secondary | ICD-10-CM | POA: Diagnosis not present

## 2020-10-07 DIAGNOSIS — E222 Syndrome of inappropriate secretion of antidiuretic hormone: Secondary | ICD-10-CM | POA: Diagnosis not present

## 2020-10-07 DIAGNOSIS — F32A Depression, unspecified: Secondary | ICD-10-CM | POA: Diagnosis not present

## 2020-10-07 DIAGNOSIS — Z7982 Long term (current) use of aspirin: Secondary | ICD-10-CM | POA: Diagnosis not present

## 2020-10-07 DIAGNOSIS — L988 Other specified disorders of the skin and subcutaneous tissue: Secondary | ICD-10-CM | POA: Diagnosis not present

## 2020-10-07 DIAGNOSIS — I2721 Secondary pulmonary arterial hypertension: Secondary | ICD-10-CM | POA: Diagnosis not present

## 2020-10-07 DIAGNOSIS — K760 Fatty (change of) liver, not elsewhere classified: Secondary | ICD-10-CM | POA: Diagnosis not present

## 2020-10-07 DIAGNOSIS — J9611 Chronic respiratory failure with hypoxia: Secondary | ICD-10-CM | POA: Diagnosis not present

## 2020-10-07 DIAGNOSIS — D649 Anemia, unspecified: Secondary | ICD-10-CM | POA: Diagnosis not present

## 2020-10-07 DIAGNOSIS — K222 Esophageal obstruction: Secondary | ICD-10-CM | POA: Diagnosis not present

## 2020-10-07 DIAGNOSIS — Z7951 Long term (current) use of inhaled steroids: Secondary | ICD-10-CM | POA: Diagnosis not present

## 2020-10-07 DIAGNOSIS — Z87440 Personal history of urinary (tract) infections: Secondary | ICD-10-CM | POA: Diagnosis not present

## 2020-10-07 DIAGNOSIS — I110 Hypertensive heart disease with heart failure: Secondary | ICD-10-CM | POA: Diagnosis not present

## 2020-10-07 NOTE — Telephone Encounter (Signed)
ok 

## 2020-10-10 DIAGNOSIS — K222 Esophageal obstruction: Secondary | ICD-10-CM | POA: Diagnosis not present

## 2020-10-10 DIAGNOSIS — I69391 Dysphagia following cerebral infarction: Secondary | ICD-10-CM | POA: Diagnosis not present

## 2020-10-10 DIAGNOSIS — I2721 Secondary pulmonary arterial hypertension: Secondary | ICD-10-CM | POA: Diagnosis not present

## 2020-10-10 DIAGNOSIS — L988 Other specified disorders of the skin and subcutaneous tissue: Secondary | ICD-10-CM | POA: Diagnosis not present

## 2020-10-10 DIAGNOSIS — Z87440 Personal history of urinary (tract) infections: Secondary | ICD-10-CM | POA: Diagnosis not present

## 2020-10-10 DIAGNOSIS — I5032 Chronic diastolic (congestive) heart failure: Secondary | ICD-10-CM | POA: Diagnosis not present

## 2020-10-10 DIAGNOSIS — E222 Syndrome of inappropriate secretion of antidiuretic hormone: Secondary | ICD-10-CM | POA: Diagnosis not present

## 2020-10-10 DIAGNOSIS — Z7982 Long term (current) use of aspirin: Secondary | ICD-10-CM | POA: Diagnosis not present

## 2020-10-10 DIAGNOSIS — K219 Gastro-esophageal reflux disease without esophagitis: Secondary | ICD-10-CM | POA: Diagnosis not present

## 2020-10-10 DIAGNOSIS — K760 Fatty (change of) liver, not elsewhere classified: Secondary | ICD-10-CM | POA: Diagnosis not present

## 2020-10-10 DIAGNOSIS — Z9981 Dependence on supplemental oxygen: Secondary | ICD-10-CM | POA: Diagnosis not present

## 2020-10-10 DIAGNOSIS — I252 Old myocardial infarction: Secondary | ICD-10-CM | POA: Diagnosis not present

## 2020-10-10 DIAGNOSIS — M7502 Adhesive capsulitis of left shoulder: Secondary | ICD-10-CM | POA: Diagnosis not present

## 2020-10-10 DIAGNOSIS — Z7951 Long term (current) use of inhaled steroids: Secondary | ICD-10-CM | POA: Diagnosis not present

## 2020-10-10 DIAGNOSIS — F32A Depression, unspecified: Secondary | ICD-10-CM | POA: Diagnosis not present

## 2020-10-10 DIAGNOSIS — I110 Hypertensive heart disease with heart failure: Secondary | ICD-10-CM | POA: Diagnosis not present

## 2020-10-10 DIAGNOSIS — J9611 Chronic respiratory failure with hypoxia: Secondary | ICD-10-CM | POA: Diagnosis not present

## 2020-10-10 DIAGNOSIS — E039 Hypothyroidism, unspecified: Secondary | ICD-10-CM | POA: Diagnosis not present

## 2020-10-10 DIAGNOSIS — E785 Hyperlipidemia, unspecified: Secondary | ICD-10-CM | POA: Diagnosis not present

## 2020-10-10 DIAGNOSIS — M5459 Other low back pain: Secondary | ICD-10-CM | POA: Diagnosis not present

## 2020-10-10 DIAGNOSIS — D649 Anemia, unspecified: Secondary | ICD-10-CM | POA: Diagnosis not present

## 2020-10-13 DIAGNOSIS — E222 Syndrome of inappropriate secretion of antidiuretic hormone: Secondary | ICD-10-CM | POA: Diagnosis not present

## 2020-10-13 DIAGNOSIS — K760 Fatty (change of) liver, not elsewhere classified: Secondary | ICD-10-CM | POA: Diagnosis not present

## 2020-10-13 DIAGNOSIS — Z87440 Personal history of urinary (tract) infections: Secondary | ICD-10-CM | POA: Diagnosis not present

## 2020-10-13 DIAGNOSIS — D649 Anemia, unspecified: Secondary | ICD-10-CM | POA: Diagnosis not present

## 2020-10-13 DIAGNOSIS — M5459 Other low back pain: Secondary | ICD-10-CM | POA: Diagnosis not present

## 2020-10-13 DIAGNOSIS — I110 Hypertensive heart disease with heart failure: Secondary | ICD-10-CM | POA: Diagnosis not present

## 2020-10-13 DIAGNOSIS — J9611 Chronic respiratory failure with hypoxia: Secondary | ICD-10-CM | POA: Diagnosis not present

## 2020-10-13 DIAGNOSIS — K222 Esophageal obstruction: Secondary | ICD-10-CM | POA: Diagnosis not present

## 2020-10-13 DIAGNOSIS — M7502 Adhesive capsulitis of left shoulder: Secondary | ICD-10-CM | POA: Diagnosis not present

## 2020-10-13 DIAGNOSIS — I5032 Chronic diastolic (congestive) heart failure: Secondary | ICD-10-CM | POA: Diagnosis not present

## 2020-10-13 DIAGNOSIS — E785 Hyperlipidemia, unspecified: Secondary | ICD-10-CM | POA: Diagnosis not present

## 2020-10-13 DIAGNOSIS — Z9981 Dependence on supplemental oxygen: Secondary | ICD-10-CM | POA: Diagnosis not present

## 2020-10-13 DIAGNOSIS — E039 Hypothyroidism, unspecified: Secondary | ICD-10-CM | POA: Diagnosis not present

## 2020-10-13 DIAGNOSIS — L988 Other specified disorders of the skin and subcutaneous tissue: Secondary | ICD-10-CM | POA: Diagnosis not present

## 2020-10-13 DIAGNOSIS — I2721 Secondary pulmonary arterial hypertension: Secondary | ICD-10-CM | POA: Diagnosis not present

## 2020-10-13 DIAGNOSIS — Z7951 Long term (current) use of inhaled steroids: Secondary | ICD-10-CM | POA: Diagnosis not present

## 2020-10-13 DIAGNOSIS — I252 Old myocardial infarction: Secondary | ICD-10-CM | POA: Diagnosis not present

## 2020-10-13 DIAGNOSIS — I69391 Dysphagia following cerebral infarction: Secondary | ICD-10-CM | POA: Diagnosis not present

## 2020-10-13 DIAGNOSIS — F32A Depression, unspecified: Secondary | ICD-10-CM | POA: Diagnosis not present

## 2020-10-13 DIAGNOSIS — K219 Gastro-esophageal reflux disease without esophagitis: Secondary | ICD-10-CM | POA: Diagnosis not present

## 2020-10-13 DIAGNOSIS — Z7982 Long term (current) use of aspirin: Secondary | ICD-10-CM | POA: Diagnosis not present

## 2020-10-14 DIAGNOSIS — I110 Hypertensive heart disease with heart failure: Secondary | ICD-10-CM | POA: Diagnosis not present

## 2020-10-14 DIAGNOSIS — M5459 Other low back pain: Secondary | ICD-10-CM | POA: Diagnosis not present

## 2020-10-14 DIAGNOSIS — J9611 Chronic respiratory failure with hypoxia: Secondary | ICD-10-CM | POA: Diagnosis not present

## 2020-10-14 DIAGNOSIS — Z7982 Long term (current) use of aspirin: Secondary | ICD-10-CM | POA: Diagnosis not present

## 2020-10-14 DIAGNOSIS — K222 Esophageal obstruction: Secondary | ICD-10-CM | POA: Diagnosis not present

## 2020-10-14 DIAGNOSIS — I5032 Chronic diastolic (congestive) heart failure: Secondary | ICD-10-CM | POA: Diagnosis not present

## 2020-10-14 DIAGNOSIS — E785 Hyperlipidemia, unspecified: Secondary | ICD-10-CM | POA: Diagnosis not present

## 2020-10-14 DIAGNOSIS — F32A Depression, unspecified: Secondary | ICD-10-CM | POA: Diagnosis not present

## 2020-10-14 DIAGNOSIS — Z9981 Dependence on supplemental oxygen: Secondary | ICD-10-CM | POA: Diagnosis not present

## 2020-10-14 DIAGNOSIS — I252 Old myocardial infarction: Secondary | ICD-10-CM | POA: Diagnosis not present

## 2020-10-14 DIAGNOSIS — K760 Fatty (change of) liver, not elsewhere classified: Secondary | ICD-10-CM | POA: Diagnosis not present

## 2020-10-14 DIAGNOSIS — E222 Syndrome of inappropriate secretion of antidiuretic hormone: Secondary | ICD-10-CM | POA: Diagnosis not present

## 2020-10-14 DIAGNOSIS — I69391 Dysphagia following cerebral infarction: Secondary | ICD-10-CM | POA: Diagnosis not present

## 2020-10-14 DIAGNOSIS — K219 Gastro-esophageal reflux disease without esophagitis: Secondary | ICD-10-CM | POA: Diagnosis not present

## 2020-10-14 DIAGNOSIS — Z87440 Personal history of urinary (tract) infections: Secondary | ICD-10-CM | POA: Diagnosis not present

## 2020-10-14 DIAGNOSIS — Z7951 Long term (current) use of inhaled steroids: Secondary | ICD-10-CM | POA: Diagnosis not present

## 2020-10-14 DIAGNOSIS — E039 Hypothyroidism, unspecified: Secondary | ICD-10-CM | POA: Diagnosis not present

## 2020-10-14 DIAGNOSIS — I2721 Secondary pulmonary arterial hypertension: Secondary | ICD-10-CM | POA: Diagnosis not present

## 2020-10-14 DIAGNOSIS — M7502 Adhesive capsulitis of left shoulder: Secondary | ICD-10-CM | POA: Diagnosis not present

## 2020-10-14 DIAGNOSIS — L988 Other specified disorders of the skin and subcutaneous tissue: Secondary | ICD-10-CM | POA: Diagnosis not present

## 2020-10-14 DIAGNOSIS — D649 Anemia, unspecified: Secondary | ICD-10-CM | POA: Diagnosis not present

## 2020-10-18 DIAGNOSIS — M5459 Other low back pain: Secondary | ICD-10-CM | POA: Diagnosis not present

## 2020-10-18 DIAGNOSIS — K222 Esophageal obstruction: Secondary | ICD-10-CM | POA: Diagnosis not present

## 2020-10-18 DIAGNOSIS — I5032 Chronic diastolic (congestive) heart failure: Secondary | ICD-10-CM | POA: Diagnosis not present

## 2020-10-18 DIAGNOSIS — I252 Old myocardial infarction: Secondary | ICD-10-CM | POA: Diagnosis not present

## 2020-10-18 DIAGNOSIS — E222 Syndrome of inappropriate secretion of antidiuretic hormone: Secondary | ICD-10-CM | POA: Diagnosis not present

## 2020-10-18 DIAGNOSIS — I69391 Dysphagia following cerebral infarction: Secondary | ICD-10-CM | POA: Diagnosis not present

## 2020-10-18 DIAGNOSIS — I110 Hypertensive heart disease with heart failure: Secondary | ICD-10-CM | POA: Diagnosis not present

## 2020-10-18 DIAGNOSIS — E039 Hypothyroidism, unspecified: Secondary | ICD-10-CM | POA: Diagnosis not present

## 2020-10-18 DIAGNOSIS — Z87440 Personal history of urinary (tract) infections: Secondary | ICD-10-CM | POA: Diagnosis not present

## 2020-10-18 DIAGNOSIS — Z7982 Long term (current) use of aspirin: Secondary | ICD-10-CM | POA: Diagnosis not present

## 2020-10-18 DIAGNOSIS — Z9981 Dependence on supplemental oxygen: Secondary | ICD-10-CM | POA: Diagnosis not present

## 2020-10-18 DIAGNOSIS — E785 Hyperlipidemia, unspecified: Secondary | ICD-10-CM | POA: Diagnosis not present

## 2020-10-18 DIAGNOSIS — Z7951 Long term (current) use of inhaled steroids: Secondary | ICD-10-CM | POA: Diagnosis not present

## 2020-10-18 DIAGNOSIS — D649 Anemia, unspecified: Secondary | ICD-10-CM | POA: Diagnosis not present

## 2020-10-18 DIAGNOSIS — J9611 Chronic respiratory failure with hypoxia: Secondary | ICD-10-CM | POA: Diagnosis not present

## 2020-10-18 DIAGNOSIS — I2721 Secondary pulmonary arterial hypertension: Secondary | ICD-10-CM | POA: Diagnosis not present

## 2020-10-18 DIAGNOSIS — K760 Fatty (change of) liver, not elsewhere classified: Secondary | ICD-10-CM | POA: Diagnosis not present

## 2020-10-18 DIAGNOSIS — F32A Depression, unspecified: Secondary | ICD-10-CM | POA: Diagnosis not present

## 2020-10-18 DIAGNOSIS — L988 Other specified disorders of the skin and subcutaneous tissue: Secondary | ICD-10-CM | POA: Diagnosis not present

## 2020-10-18 DIAGNOSIS — M7502 Adhesive capsulitis of left shoulder: Secondary | ICD-10-CM | POA: Diagnosis not present

## 2020-10-18 DIAGNOSIS — K219 Gastro-esophageal reflux disease without esophagitis: Secondary | ICD-10-CM | POA: Diagnosis not present

## 2020-10-19 DIAGNOSIS — Z7951 Long term (current) use of inhaled steroids: Secondary | ICD-10-CM | POA: Diagnosis not present

## 2020-10-19 DIAGNOSIS — K222 Esophageal obstruction: Secondary | ICD-10-CM | POA: Diagnosis not present

## 2020-10-19 DIAGNOSIS — M5459 Other low back pain: Secondary | ICD-10-CM | POA: Diagnosis not present

## 2020-10-19 DIAGNOSIS — I2721 Secondary pulmonary arterial hypertension: Secondary | ICD-10-CM | POA: Diagnosis not present

## 2020-10-19 DIAGNOSIS — E222 Syndrome of inappropriate secretion of antidiuretic hormone: Secondary | ICD-10-CM | POA: Diagnosis not present

## 2020-10-19 DIAGNOSIS — Z9981 Dependence on supplemental oxygen: Secondary | ICD-10-CM | POA: Diagnosis not present

## 2020-10-19 DIAGNOSIS — I252 Old myocardial infarction: Secondary | ICD-10-CM | POA: Diagnosis not present

## 2020-10-19 DIAGNOSIS — Z7982 Long term (current) use of aspirin: Secondary | ICD-10-CM | POA: Diagnosis not present

## 2020-10-19 DIAGNOSIS — I5032 Chronic diastolic (congestive) heart failure: Secondary | ICD-10-CM | POA: Diagnosis not present

## 2020-10-19 DIAGNOSIS — I110 Hypertensive heart disease with heart failure: Secondary | ICD-10-CM | POA: Diagnosis not present

## 2020-10-19 DIAGNOSIS — L988 Other specified disorders of the skin and subcutaneous tissue: Secondary | ICD-10-CM | POA: Diagnosis not present

## 2020-10-19 DIAGNOSIS — E039 Hypothyroidism, unspecified: Secondary | ICD-10-CM | POA: Diagnosis not present

## 2020-10-19 DIAGNOSIS — E785 Hyperlipidemia, unspecified: Secondary | ICD-10-CM | POA: Diagnosis not present

## 2020-10-19 DIAGNOSIS — K760 Fatty (change of) liver, not elsewhere classified: Secondary | ICD-10-CM | POA: Diagnosis not present

## 2020-10-19 DIAGNOSIS — Z87440 Personal history of urinary (tract) infections: Secondary | ICD-10-CM | POA: Diagnosis not present

## 2020-10-19 DIAGNOSIS — M7502 Adhesive capsulitis of left shoulder: Secondary | ICD-10-CM | POA: Diagnosis not present

## 2020-10-19 DIAGNOSIS — D649 Anemia, unspecified: Secondary | ICD-10-CM | POA: Diagnosis not present

## 2020-10-19 DIAGNOSIS — K219 Gastro-esophageal reflux disease without esophagitis: Secondary | ICD-10-CM | POA: Diagnosis not present

## 2020-10-19 DIAGNOSIS — F32A Depression, unspecified: Secondary | ICD-10-CM | POA: Diagnosis not present

## 2020-10-19 DIAGNOSIS — J9611 Chronic respiratory failure with hypoxia: Secondary | ICD-10-CM | POA: Diagnosis not present

## 2020-10-19 DIAGNOSIS — I69391 Dysphagia following cerebral infarction: Secondary | ICD-10-CM | POA: Diagnosis not present

## 2020-10-20 DIAGNOSIS — E871 Hypo-osmolality and hyponatremia: Secondary | ICD-10-CM | POA: Diagnosis not present

## 2020-10-20 DIAGNOSIS — E039 Hypothyroidism, unspecified: Secondary | ICD-10-CM | POA: Diagnosis not present

## 2020-10-20 DIAGNOSIS — I5032 Chronic diastolic (congestive) heart failure: Secondary | ICD-10-CM | POA: Diagnosis not present

## 2020-10-20 DIAGNOSIS — E876 Hypokalemia: Secondary | ICD-10-CM | POA: Diagnosis not present

## 2020-10-21 DIAGNOSIS — K222 Esophageal obstruction: Secondary | ICD-10-CM | POA: Diagnosis not present

## 2020-10-21 DIAGNOSIS — K760 Fatty (change of) liver, not elsewhere classified: Secondary | ICD-10-CM | POA: Diagnosis not present

## 2020-10-21 DIAGNOSIS — I69391 Dysphagia following cerebral infarction: Secondary | ICD-10-CM | POA: Diagnosis not present

## 2020-10-21 DIAGNOSIS — Z7982 Long term (current) use of aspirin: Secondary | ICD-10-CM | POA: Diagnosis not present

## 2020-10-21 DIAGNOSIS — E222 Syndrome of inappropriate secretion of antidiuretic hormone: Secondary | ICD-10-CM | POA: Diagnosis not present

## 2020-10-21 DIAGNOSIS — F32A Depression, unspecified: Secondary | ICD-10-CM | POA: Diagnosis not present

## 2020-10-21 DIAGNOSIS — E785 Hyperlipidemia, unspecified: Secondary | ICD-10-CM | POA: Diagnosis not present

## 2020-10-21 DIAGNOSIS — E039 Hypothyroidism, unspecified: Secondary | ICD-10-CM | POA: Diagnosis not present

## 2020-10-21 DIAGNOSIS — J9611 Chronic respiratory failure with hypoxia: Secondary | ICD-10-CM | POA: Diagnosis not present

## 2020-10-21 DIAGNOSIS — I2721 Secondary pulmonary arterial hypertension: Secondary | ICD-10-CM | POA: Diagnosis not present

## 2020-10-21 DIAGNOSIS — Z7951 Long term (current) use of inhaled steroids: Secondary | ICD-10-CM | POA: Diagnosis not present

## 2020-10-21 DIAGNOSIS — I110 Hypertensive heart disease with heart failure: Secondary | ICD-10-CM | POA: Diagnosis not present

## 2020-10-21 DIAGNOSIS — M7502 Adhesive capsulitis of left shoulder: Secondary | ICD-10-CM | POA: Diagnosis not present

## 2020-10-21 DIAGNOSIS — I252 Old myocardial infarction: Secondary | ICD-10-CM | POA: Diagnosis not present

## 2020-10-21 DIAGNOSIS — D649 Anemia, unspecified: Secondary | ICD-10-CM | POA: Diagnosis not present

## 2020-10-21 DIAGNOSIS — I5032 Chronic diastolic (congestive) heart failure: Secondary | ICD-10-CM | POA: Diagnosis not present

## 2020-10-21 DIAGNOSIS — Z9981 Dependence on supplemental oxygen: Secondary | ICD-10-CM | POA: Diagnosis not present

## 2020-10-21 DIAGNOSIS — L988 Other specified disorders of the skin and subcutaneous tissue: Secondary | ICD-10-CM | POA: Diagnosis not present

## 2020-10-21 DIAGNOSIS — Z87440 Personal history of urinary (tract) infections: Secondary | ICD-10-CM | POA: Diagnosis not present

## 2020-10-21 DIAGNOSIS — K219 Gastro-esophageal reflux disease without esophagitis: Secondary | ICD-10-CM | POA: Diagnosis not present

## 2020-10-21 DIAGNOSIS — M5459 Other low back pain: Secondary | ICD-10-CM | POA: Diagnosis not present

## 2020-10-24 ENCOUNTER — Ambulatory Visit: Payer: Medicare Other | Admitting: Family Medicine

## 2020-10-24 DIAGNOSIS — M5459 Other low back pain: Secondary | ICD-10-CM | POA: Diagnosis not present

## 2020-10-24 DIAGNOSIS — K760 Fatty (change of) liver, not elsewhere classified: Secondary | ICD-10-CM | POA: Diagnosis not present

## 2020-10-24 DIAGNOSIS — M7502 Adhesive capsulitis of left shoulder: Secondary | ICD-10-CM | POA: Diagnosis not present

## 2020-10-24 DIAGNOSIS — L988 Other specified disorders of the skin and subcutaneous tissue: Secondary | ICD-10-CM | POA: Diagnosis not present

## 2020-10-24 DIAGNOSIS — I69391 Dysphagia following cerebral infarction: Secondary | ICD-10-CM | POA: Diagnosis not present

## 2020-10-24 DIAGNOSIS — E222 Syndrome of inappropriate secretion of antidiuretic hormone: Secondary | ICD-10-CM | POA: Diagnosis not present

## 2020-10-24 DIAGNOSIS — J9611 Chronic respiratory failure with hypoxia: Secondary | ICD-10-CM | POA: Diagnosis not present

## 2020-10-24 DIAGNOSIS — Z7982 Long term (current) use of aspirin: Secondary | ICD-10-CM | POA: Diagnosis not present

## 2020-10-24 DIAGNOSIS — F32A Depression, unspecified: Secondary | ICD-10-CM | POA: Diagnosis not present

## 2020-10-24 DIAGNOSIS — I2721 Secondary pulmonary arterial hypertension: Secondary | ICD-10-CM | POA: Diagnosis not present

## 2020-10-24 DIAGNOSIS — I110 Hypertensive heart disease with heart failure: Secondary | ICD-10-CM | POA: Diagnosis not present

## 2020-10-24 DIAGNOSIS — E039 Hypothyroidism, unspecified: Secondary | ICD-10-CM | POA: Diagnosis not present

## 2020-10-24 DIAGNOSIS — E785 Hyperlipidemia, unspecified: Secondary | ICD-10-CM | POA: Diagnosis not present

## 2020-10-24 DIAGNOSIS — D649 Anemia, unspecified: Secondary | ICD-10-CM | POA: Diagnosis not present

## 2020-10-24 DIAGNOSIS — Z87440 Personal history of urinary (tract) infections: Secondary | ICD-10-CM | POA: Diagnosis not present

## 2020-10-24 DIAGNOSIS — I5032 Chronic diastolic (congestive) heart failure: Secondary | ICD-10-CM | POA: Diagnosis not present

## 2020-10-24 DIAGNOSIS — K222 Esophageal obstruction: Secondary | ICD-10-CM | POA: Diagnosis not present

## 2020-10-24 DIAGNOSIS — K219 Gastro-esophageal reflux disease without esophagitis: Secondary | ICD-10-CM | POA: Diagnosis not present

## 2020-10-24 DIAGNOSIS — I252 Old myocardial infarction: Secondary | ICD-10-CM | POA: Diagnosis not present

## 2020-10-24 DIAGNOSIS — Z7951 Long term (current) use of inhaled steroids: Secondary | ICD-10-CM | POA: Diagnosis not present

## 2020-10-24 DIAGNOSIS — Z9981 Dependence on supplemental oxygen: Secondary | ICD-10-CM | POA: Diagnosis not present

## 2020-10-25 ENCOUNTER — Ambulatory Visit (INDEPENDENT_AMBULATORY_CARE_PROVIDER_SITE_OTHER): Payer: Medicare Other | Admitting: Family Medicine

## 2020-10-25 ENCOUNTER — Encounter: Payer: Self-pay | Admitting: Family Medicine

## 2020-10-25 ENCOUNTER — Other Ambulatory Visit: Payer: Self-pay

## 2020-10-25 VITALS — BP 148/56 | HR 65 | Temp 97.5°F | Ht 62.0 in | Wt 157.0 lb

## 2020-10-25 DIAGNOSIS — G8194 Hemiplegia, unspecified affecting left nondominant side: Secondary | ICD-10-CM | POA: Diagnosis not present

## 2020-10-25 DIAGNOSIS — R059 Cough, unspecified: Secondary | ICD-10-CM

## 2020-10-25 DIAGNOSIS — K219 Gastro-esophageal reflux disease without esophagitis: Secondary | ICD-10-CM

## 2020-10-25 DIAGNOSIS — E871 Hypo-osmolality and hyponatremia: Secondary | ICD-10-CM

## 2020-10-25 DIAGNOSIS — L89153 Pressure ulcer of sacral region, stage 3: Secondary | ICD-10-CM | POA: Diagnosis not present

## 2020-10-25 DIAGNOSIS — G20 Parkinson's disease: Secondary | ICD-10-CM

## 2020-10-25 MED ORDER — SUCRALFATE 1 GM/10ML PO SUSP: 1.0000 g | Freq: Three times a day (TID) | ORAL | 0 refills | Status: DC

## 2020-10-25 NOTE — Progress Notes (Signed)
11/23/20214:42 PM  Tammy Fernandez [birth year], 86 y.o., female [id number]  Chief Complaint  Patient presents with  . Headache    sinus preassure, runny eyes and nose, dizziness x 2 months   . chest congestion and phlegm    hoarseness, coughing causing difficulty eating , wheezing  . Gastroesophageal Reflux    sitting up to sleep     HPI:   Patient is a 86 y.o. female with past medical history significant for hyponatremia2/2 SIADH, constipation, HTN,dCHF,severepHTN, chronic respiratory failureon oxygen, CVA, GERD and seasonal allergieswho presents today forroutine follow-up.  Gerd: Famotidine, omeprazole Had not been taking famotidine Makes it hard to eat Frequent cough with clear mucus  Using the albuterol 4 times per day  Frequent headaches, watery eyes Change of seasons allergies Taking Cetrizine and flonase Tylenol for headaches  Discussed chronic anemia Eating frequent greens She has frequent issues with Constipation   Nutrition: Boost  She has a Sacrum pressure sore followed by home health  BP Readings from Last 3 Encounters:  10/25/20 (!) 148/56  10/05/20 140/84  09/22/20 (!) 160/68     Depression screen PHQ 2/9 10/25/2020 09/22/2020 06/27/2020  Decreased Interest 0 0 0  Down, Depressed, Hopeless 0 0 0  PHQ - 2 Score 0 0 0  Altered sleeping - - -  Tired, decreased energy - - -  Change in appetite - - -  Feeling bad or failure about yourself  - - -  Trouble concentrating - - -  Moving slowly or fidgety/restless - - -  Suicidal thoughts - - -  PHQ-9 Score - - -  Difficult doing work/chores - - -  Some recent data might be hidden    Fall Risk  10/25/2020 09/22/2020 08/09/2020 06/27/2020 02/22/2020  Falls in the past year? 0 1 0 0 0  Number falls in past yr: 0 1 0 0 0  Injury with Fall? 0 0 0 0 0  Risk for fall due to : - - Impaired balance/gait;Impaired mobility - Impaired balance/gait;Impaired mobility  Risk for fall due to: Comment - - - -  -  Follow up Falls evaluation completed Falls evaluation completed Falls evaluation completed Falls evaluation completed Falls evaluation completed;Falls prevention discussed     Allergies  Allergen Reactions  . Crestor [Rosuvastatin Calcium] Other (See Comments)    Muscle Aches  . Keflex [Cephalexin] Other (See Comments)    dizziness  . Peanut Butter Flavor Hives  . Sulfa Antibiotics Hives  . Cephalexin Other (See Comments)    dizziness  . Sulfa Drugs Cross Reactors Rash    Prior to Admission medications   Medication Sig Start Date End Date Taking? Authorizing Provider  albuterol (PROVENTIL) (2.5 MG/3ML) 0.083% nebulizer solution USE 3 ML VIA NEBULIZER EVERY 4 HOURS AS NEEDED FOR WHEEZING OR SHORTNESS OF BREATH 08/16/20  Yes Myles Lipps, MD  ALPRAZolam Prudy Feeler) 1 MG tablet Take 1 tablet (1 mg total) by mouth 2 (two) times daily as needed for anxiety. 08/19/20  Yes Myles Lipps, MD  amLODipine (NORVASC) 10 MG tablet TAKE 1 TABLET(10 MG) BY MOUTH DAILY 08/19/20  Yes Myles Lipps, MD  Aspirin 81 MG EC tablet Take 81 mg by mouth daily.   Yes [provider]  azelastine (ASTELIN) 0.1 % nasal spray Place 1 spray into both nostrils 2 (two) times daily. Use in each nostril as directed 05/31/20  Yes Glenford Bayley, NP  cetirizine (ZYRTEC) 10 MG tablet Take 10 mg by mouth daily.  Yes [provider]  clobetasol ointment (TEMOVATE) 0.05 % Apply to affected area every night for 4 weeks, then every other day for 4 weeks and then twice a week for 4 weeks or until resolution. 02/02/20  Yes Constant, Peggy, MD  clotrimazole-betamethasone (LOTRISONE) cream Apply 1 application topically 2 (two) times daily. 08/23/20  Yes Constant, Peggy, MD  demeclocycline (DECLOMYCIN) 300 MG tablet TAKE 1/2 TABLET BY MOUTH TWICE DAILY 10/13/19  Yes Myles Lipps, MD  diclofenac sodium (VOLTAREN) 1 % GEL Apply 2 g topically 4 (four) times daily. 06/02/19  Yes Stallings, Zoe A, MD   fluticasone (FLONASE) 50 MCG/ACT nasal spray SHAKE LIQUID AND USE 1 SPRAY IN EACH NOSTRIL TWICE DAILY 04/30/20  Yes Myles Lipps, MD  glycerin adult 2 g suppository Place 1 suppository rectally as needed for constipation. 01/27/19  Yes Myles Lipps, MD  guaiFENesin (MUCINEX) 600 MG 12 hr tablet Take 1 tablet (600 mg total) by mouth 2 (two) times daily as needed for to loosen phlegm. 08/19/20  Yes Myles Lipps, MD  hydrALAZINE (APRESOLINE) 10 MG tablet Take 1 tablet (10 mg total) by mouth daily as needed (take if BP > 150/90). 08/12/20  Yes Myles Lipps, MD  ipratropium (ATROVENT) 0.03 % nasal spray USE 2 SPRAYS IN EACH NOSTRIL TWICE DAILY 04/01/20  Yes Collie Siad A, MD  levothyroxine (SYNTHROID) 50 MCG tablet TAKE 1 TABLET(50 MCG) BY MOUTH DAILY BEFORE BREAKFAST 08/19/20  Yes Myles Lipps, MD  meclizine (ANTIVERT) 12.5 MG tablet Take 1 tablet (12.5 mg total) by mouth 3 (three) times daily as needed for dizziness. 01/12/20  Yes Myles Lipps, MD  olopatadine (PATANOL) 0.1 % ophthalmic solution Place 1 drop into both eyes daily as needed for allergies. 01/28/20  Yes Myles Lipps, MD  propranolol ER (INDERAL LA) 60 MG 24 hr capsule TAKE 1 CAPSULE(60 MG) BY MOUTH DAILY 08/19/20  Yes Myles Lipps, MD  Respiratory Therapy Supplies (NEBULIZER) DEVI Use with nebulized albuterol solution as needed for wheezing, cough or shortness of breath 08/17/20  Yes Myles Lipps, MD  sodium chloride 1 g tablet Take 2 tablets (2 g total) by mouth 2 (two) times daily. 06/29/20 09/27/20 Yes Myles Lipps, MD  omeprazole (PRILOSEC) 20 MG capsule Take 1 capsule (20 mg total) by mouth daily AND 2 capsules (40 mg total) at bedtime. 06/20/20 09/18/20  Myles Lipps, MD    Past Medical History:  Diagnosis Date  . Anxiety   . Chronic lower back pain   . Constipation    h/o fecal disimpaction on 2017  . CVA (cerebral vascular accident) (HCC) 2015   neg workup, MRI small acute lacunar infarcts.  ASA only  . Depression   . Diastolic CHF, chronic (HCC)    grade I, most recent echo Jan 2019  . Fatty liver   . GERD (gastroesophageal reflux disease)   . Hyperlipidemia   . Hypertension   . Hyponatremia    multiple episodes with encephalopathy, renal thinks SIADH  . Hypothyroidism, unspecified   . Internal hemorrhoid   . NSTEMI (non-ST elevated myocardial infarction) (HCC) 08/28/2016  . Palpitations   . PFO with atrial septal aneurysm    TTE 2015  . Pneumonia 2016  . Severe pulmonary arterial systolic hypertension (HCC) 12/25/2017   per echo, on 1L oxgen via     Past Surgical History:  Procedure Laterality Date  . ABDOMINAL HYSTERECTOMY    . CATARACT EXTRACTION W/ INTRAOCULAR LENS  IMPLANT, BILATERAL Bilateral   . CESAREAN SECTION    . TEE WITHOUT CARDIOVERSION N/A 01/19/2014   Procedure: TRANSESOPHAGEAL ECHOCARDIOGRAM (TEE);  Surgeon: Laurey Morale, MD;  Location: [facility] ENDOSCOPY;  Service: Cardiovascular;  Laterality: N/A;  [name]  . VAGINAL HYSTERECTOMY      Social History   Tobacco Use  . Smoking status: Never Smoker  . Smokeless tobacco: Former Neurosurgeon    Types: Snuff  . Tobacco comment: "used snuff when I was 19"  Substance Use Topics  . Alcohol use: No    Family History  Problem Relation Age of Onset  . Stroke Father   . Hypertension Father   . Colon cancer Cousin   . Colon cancer Other        Aunt  . Diabetes Other        aunt and cousins  . Heart disease Other        cousins  . Healthy Daughter   . Healthy Son     Review of Systems  Constitutional: Positive for malaise/fatigue. Negative for chills and fever.  HENT: Positive for congestion.   Eyes: Negative for blurred vision, double vision and discharge.  Respiratory: Positive for cough and sputum production. Negative for shortness of breath and wheezing.   Cardiovascular: Negative for chest pain, palpitations and leg swelling.  Gastrointestinal: Positive for constipation. Negative for  abdominal pain, blood in stool, diarrhea, heartburn, nausea and vomiting.  Genitourinary: Negative for dysuria, frequency and hematuria.  Musculoskeletal: Negative for back pain, falls and joint pain.  Skin: Negative for rash.  Neurological: Positive for dizziness and weakness. Negative for headaches.   OBJECTIVE:  Today's Vitals   10/25/20 1310  BP: (!) 148/56  Pulse: 65  Temp: (!) 97.5 F (36.4 C)  SpO2: 99%  Weight: 157 lb (71.2 kg)  Height: 5\' 2"  (1.575 m)   Body mass index is 28.72 kg/m.   Physical Exam Constitutional:      General: She is not in acute distress.    Appearance: Normal appearance. She is not ill-appearing.  HENT:     Head: Normocephalic and atraumatic.     Right Ear: Tympanic membrane and ear canal normal.     Left Ear: Tympanic membrane and ear canal normal.     Nose: Nose normal.     Mouth/Throat:     Mouth: Mucous membranes are moist.     Pharynx: Oropharynx is clear. No oropharyngeal exudate or posterior oropharyngeal erythema.  Cardiovascular:     Rate and Rhythm: Normal rate and regular rhythm.     Pulses: Normal pulses.     Heart sounds: Normal heart sounds. No murmur heard.  No friction rub. No gallop.   Pulmonary:     Effort: Pulmonary effort is normal. No respiratory distress.     Breath sounds: Normal breath sounds. No stridor. No wheezing, rhonchi or rales.  Abdominal:     General: Bowel sounds are normal.     Palpations: Abdomen is soft.     Tenderness: There is no abdominal tenderness.  Musculoskeletal:     Right lower leg: No edema.     Left lower leg: No edema.  Skin:    General: Skin is warm and dry.  Neurological:     Mental Status: She is alert and oriented to person, place, and time.     Motor: Weakness present.  Psychiatric:        Mood and Affect: Mood normal.        Behavior:  Behavior normal.     No results found for this or any previous visit (from the past 24 hour(s)).  No results found.   ASSESSMENT and  PLAN     Problem List Items Addressed This Visit      Digestive   GERD (gastroesophageal reflux disease) - Primary   Relevant Medications   sucralfate (CARAFATE) 1 GM/10ML suspension Continue with Omeprazole and famotidine Discussed adherence to regimen to improve symptoms      Nervous and Auditory   Left hemiplegia (HCC)   Parkinson's disease (HCC)     Musculoskeletal and Integument   Decubitus ulcer of sacral region, stage 3 (HCC)    Other Visit Diagnoses    Cough    Discussed the importance of adhering to allergy medication regimen Discussed to limit Benadryl due to side effects Discussed the importance of continuing to cough to prevent pneumonia.      Instructions given to return to clinic sooner if symptoms don't improve. Instructions given to go to the ED with any increased SOB, Chest pain.  Return in about 4 weeks (around 11/22/2020).   Macario CarlsKelsea Rosevelt Luu, FNP-BC [facility] [address] [place], [place] [address] Ph.  [phone] Fax [phone]

## 2020-10-25 NOTE — Patient Instructions (Addendum)
Food Choices for Gastroesophageal Reflux Disease, Adult When you have gastroesophageal reflux disease (GERD), the foods you eat and your eating habits are very important. Choosing the right foods can help ease your discomfort. Think about working with a nutrition specialist (dietitian) to help you make good choices. What are tips for following this plan?  Meals  Choose healthy foods that are low in fat, such as fruits, vegetables, whole grains, low-fat dairy products, and lean meat, fish, and poultry.  Eat small meals often instead of 3 large meals a day. Eat your meals slowly, and in a place where you are relaxed. Avoid bending over or lying down until 2-3 hours after eating.  Avoid eating meals 2-3 hours before bed.  Avoid drinking a lot of liquid with meals.  Cook foods using methods other than frying. Bake, grill, or broil food instead.  Avoid or limit: ? Chocolate. ? Peppermint or spearmint. ? Alcohol. ? Pepper. ? Black and decaffeinated coffee. ? Black and decaffeinated tea. ? Bubbly (carbonated) soft drinks. ? Caffeinated energy drinks and soft drinks.  Limit high-fat foods such as: ? Fatty meat or fried foods. ? Whole milk, cream, butter, or ice cream. ? Nuts and nut butters. ? Pastries, donuts, and sweets made with butter or shortening.  Avoid foods that cause symptoms. These foods may be different for everyone. Common foods that cause symptoms include: ? Tomatoes. ? Oranges, lemons, and limes. ? Peppers. ? Spicy food. ? Onions and garlic. ? Vinegar. Lifestyle  Maintain a healthy weight. Ask your doctor what weight is healthy for you. If you need to lose weight, work with your doctor to do so safely.  Exercise for at least 30 minutes for 5 or more days each week, or as told by your doctor.  Wear loose-fitting clothes.  Do not smoke. If you need help quitting, ask your doctor.  Sleep with the head of your bed higher than your feet. Use a wedge under the  mattress or blocks under the bed frame to raise the head of the bed. Summary  When you have gastroesophageal reflux disease (GERD), food and lifestyle choices are very important in easing your symptoms.  Eat small meals often instead of 3 large meals a day. Eat your meals slowly, and in a place where you are relaxed.  Limit high-fat foods such as fatty meat or fried foods.  Avoid bending over or lying down until 2-3 hours after eating.  Avoid peppermint and spearmint, caffeine, alcohol, and chocolate. This information is not intended to replace advice given to you by your health care provider. Make sure you discuss any questions you have with your health care provider. Document Revised: 03/12/2019 Document Reviewed: 12/25/2016 Elsevier Patient Education  2020 Elsevier Inc.   Gastroesophageal Reflux Disease, Adult Gastroesophageal reflux (GER) happens when acid from the stomach flows up into the tube that connects the mouth and the stomach (esophagus). Normally, food travels down the esophagus and stays in the stomach to be digested. With GER, food and stomach acid sometimes move back up into the esophagus. You may have a disease called gastroesophageal reflux disease (GERD) if the reflux:  Happens often.  Causes frequent or very bad symptoms.  Causes problems such as damage to the esophagus. When this happens, the esophagus becomes sore and swollen (inflamed). Over time, GERD can make small holes (ulcers) in the lining of the esophagus. What are the causes? This condition is caused by a problem with the muscle between the esophagus and  the stomach. When this muscle is weak or not normal, it does not close properly to keep food and acid from coming back up from the stomach. The muscle can be weak because of:  Tobacco use.  Pregnancy.  Having a certain type of hernia (hiatal hernia).  Alcohol use.  Certain foods and drinks, such as coffee, chocolate, onions, and peppermint. What  increases the risk? You are more likely to develop this condition if you:  Are overweight.  Have a disease that affects your connective tissue.  Use NSAID medicines. What are the signs or symptoms? Symptoms of this condition include:  Heartburn.  Difficult or painful swallowing.  The feeling of having a lump in the throat.  A bitter taste in the mouth.  Bad breath.  Having a lot of saliva.  Having an upset or bloated stomach.  Belching.  Chest pain. Different conditions can cause chest pain. Make sure you see your doctor if you have chest pain.  Shortness of breath or noisy breathing (wheezing).  Ongoing (chronic) cough or a cough at night.  Wearing away of the surface of teeth (tooth enamel).  Weight loss. How is this treated? Treatment will depend on how bad your symptoms are. Your doctor may suggest:  Changes to your diet.  Medicine.  Surgery. Follow these instructions at home: Eating and drinking   Follow a diet as told by your doctor. You may need to avoid foods and drinks such as: ? Coffee and tea (with or without caffeine). ? Drinks that contain alcohol. ? Energy drinks and sports drinks. ? Bubbly (carbonated) drinks or sodas. ? Chocolate and cocoa. ? Peppermint and mint flavorings. ? Garlic and onions. ? Horseradish. ? Spicy and acidic foods. These include peppers, chili powder, curry powder, vinegar, hot sauces, and BBQ sauce. ? Citrus fruit juices and citrus fruits, such as oranges, lemons, and limes. ? Tomato-based foods. These include red sauce, chili, salsa, and pizza with red sauce. ? Fried and fatty foods. These include donuts, french fries, potato chips, and high-fat dressings. ? High-fat meats. These include hot dogs, rib eye steak, sausage, ham, and bacon. ? High-fat dairy items, such as whole milk, butter, and cream cheese.  Eat small meals often. Avoid eating large meals.  Avoid drinking large amounts of liquid with your  meals.  Avoid eating meals during the 2-3 hours before bedtime.  Avoid lying down right after you eat.  Do not exercise right after you eat. Lifestyle   Do not use any products that contain nicotine or tobacco. These include cigarettes, e-cigarettes, and chewing tobacco. If you need help quitting, ask your doctor.  Try to lower your stress. If you need help doing this, ask your doctor.  If you are overweight, lose an amount of weight that is healthy for you. Ask your doctor about a safe weight loss goal. General instructions  Pay attention to any changes in your symptoms.  Take over-the-counter and prescription medicines only as told by your doctor. Do not take aspirin, ibuprofen, or other NSAIDs unless your doctor says it is okay.  Wear loose clothes. Do not wear anything tight around your waist.  Raise (elevate) the head of your bed about 6 inches (15 cm).  Avoid bending over if this makes your symptoms worse.  Keep all follow-up visits as told by your doctor. This is important. Contact a doctor if:  You have new symptoms.  You lose weight and you do not know why.  You have trouble swallowing  or it hurts to swallow.  You have wheezing or a cough that keeps happening.  Your symptoms do not get better with treatment.  You have a hoarse voice. Get help right away if:  You have pain in your arms, neck, jaw, teeth, or back.  You feel sweaty, dizzy, or light-headed.  You have chest pain or shortness of breath.  You throw up (vomit) and your throw-up looks like blood or coffee grounds.  You pass out (faint).  Your poop (stool) is bloody or black.  You cannot swallow, drink, or eat. Summary  If a person has gastroesophageal reflux disease (GERD), food and stomach acid move back up into the esophagus and cause symptoms or problems such as damage to the esophagus.  Treatment will depend on how bad your symptoms are.  Follow a diet as told by your doctor.  Take  all medicines only as told by your doctor. This information is not intended to replace advice given to you by your health care provider. Make sure you discuss any questions you have with your health care provider. Document Revised: 05/28/2018 Document Reviewed: 05/28/2018 Elsevier Patient Education  [employer].   If you have lab work done today you will be contacted with your lab results within the next 2 weeks.  If you have not heard from [place] then please contact us. The fastest way to get your results is to register for My Chart.   IF you received an x-ray today, you will receive an invoice from [facility]. Please contact [facility] at [phone] with questions or concerns regarding your invoice.   IF you received labwork today, you will receive an invoice from [facility]. Please contact [facility] at [phone] with questions or concerns regarding your invoice.   Our billing staff will not be able to assist you with questions regarding bills from these companies.  You will be contacted with the lab results as soon as they are available. The fastest way to get your results is to activate your My Chart account. Instructions are located on the last page of this paperwork. If you have not heard from [place] regarding the results in 2 weeks, please contact this office.

## 2020-10-28 DIAGNOSIS — I2721 Secondary pulmonary arterial hypertension: Secondary | ICD-10-CM | POA: Diagnosis not present

## 2020-10-28 DIAGNOSIS — J9611 Chronic respiratory failure with hypoxia: Secondary | ICD-10-CM | POA: Diagnosis not present

## 2020-10-28 DIAGNOSIS — E785 Hyperlipidemia, unspecified: Secondary | ICD-10-CM | POA: Diagnosis not present

## 2020-10-28 DIAGNOSIS — Z87440 Personal history of urinary (tract) infections: Secondary | ICD-10-CM | POA: Diagnosis not present

## 2020-10-28 DIAGNOSIS — E222 Syndrome of inappropriate secretion of antidiuretic hormone: Secondary | ICD-10-CM | POA: Diagnosis not present

## 2020-10-28 DIAGNOSIS — Z9981 Dependence on supplemental oxygen: Secondary | ICD-10-CM | POA: Diagnosis not present

## 2020-10-28 DIAGNOSIS — K219 Gastro-esophageal reflux disease without esophagitis: Secondary | ICD-10-CM | POA: Diagnosis not present

## 2020-10-28 DIAGNOSIS — E039 Hypothyroidism, unspecified: Secondary | ICD-10-CM | POA: Diagnosis not present

## 2020-10-28 DIAGNOSIS — F32A Depression, unspecified: Secondary | ICD-10-CM | POA: Diagnosis not present

## 2020-10-28 DIAGNOSIS — I69391 Dysphagia following cerebral infarction: Secondary | ICD-10-CM | POA: Diagnosis not present

## 2020-10-28 DIAGNOSIS — D649 Anemia, unspecified: Secondary | ICD-10-CM | POA: Diagnosis not present

## 2020-10-28 DIAGNOSIS — I252 Old myocardial infarction: Secondary | ICD-10-CM | POA: Diagnosis not present

## 2020-10-28 DIAGNOSIS — Z7951 Long term (current) use of inhaled steroids: Secondary | ICD-10-CM | POA: Diagnosis not present

## 2020-10-28 DIAGNOSIS — K222 Esophageal obstruction: Secondary | ICD-10-CM | POA: Diagnosis not present

## 2020-10-28 DIAGNOSIS — I5032 Chronic diastolic (congestive) heart failure: Secondary | ICD-10-CM | POA: Diagnosis not present

## 2020-10-28 DIAGNOSIS — K760 Fatty (change of) liver, not elsewhere classified: Secondary | ICD-10-CM | POA: Diagnosis not present

## 2020-10-28 DIAGNOSIS — I110 Hypertensive heart disease with heart failure: Secondary | ICD-10-CM | POA: Diagnosis not present

## 2020-10-28 DIAGNOSIS — L988 Other specified disorders of the skin and subcutaneous tissue: Secondary | ICD-10-CM | POA: Diagnosis not present

## 2020-10-28 DIAGNOSIS — M7502 Adhesive capsulitis of left shoulder: Secondary | ICD-10-CM | POA: Diagnosis not present

## 2020-10-28 DIAGNOSIS — Z7982 Long term (current) use of aspirin: Secondary | ICD-10-CM | POA: Diagnosis not present

## 2020-10-28 DIAGNOSIS — M5459 Other low back pain: Secondary | ICD-10-CM | POA: Diagnosis not present

## 2020-10-31 DIAGNOSIS — J9601 Acute respiratory failure with hypoxia: Secondary | ICD-10-CM | POA: Diagnosis not present

## 2020-10-31 DIAGNOSIS — I6789 Other cerebrovascular disease: Secondary | ICD-10-CM | POA: Diagnosis not present

## 2020-10-31 DIAGNOSIS — B9562 Methicillin resistant Staphylococcus aureus infection as the cause of diseases classified elsewhere: Secondary | ICD-10-CM | POA: Diagnosis not present

## 2020-10-31 DIAGNOSIS — R7881 Bacteremia: Secondary | ICD-10-CM | POA: Diagnosis not present

## 2020-11-01 ENCOUNTER — Telehealth: Payer: Self-pay | Admitting: Family Medicine

## 2020-11-01 NOTE — Telephone Encounter (Signed)
Called home health they were closed LM nurse will call back tomorrow

## 2020-11-01 NOTE — Telephone Encounter (Signed)
Erle Crocker calling from [facility] and she has a questions for in home health care and is a little confused about who is managing pts care for in home .   Please reach out to [place]  [phone]

## 2020-11-02 NOTE — Telephone Encounter (Signed)
Received call from [place] at [facility]. Returned call regarding patient. Phones roll over to triage at 5pm. Please advise at [phone].

## 2020-11-02 NOTE — Telephone Encounter (Signed)
Checked with [employer] for status of application, pt is still in enrollment process.

## 2020-11-03 DIAGNOSIS — L89316 Pressure-induced deep tissue damage of right buttock: Secondary | ICD-10-CM | POA: Diagnosis not present

## 2020-11-08 ENCOUNTER — Other Ambulatory Visit: Payer: Self-pay | Admitting: *Deleted

## 2020-11-08 DIAGNOSIS — I2721 Secondary pulmonary arterial hypertension: Secondary | ICD-10-CM | POA: Diagnosis not present

## 2020-11-08 DIAGNOSIS — E222 Syndrome of inappropriate secretion of antidiuretic hormone: Secondary | ICD-10-CM | POA: Diagnosis not present

## 2020-11-08 DIAGNOSIS — I69391 Dysphagia following cerebral infarction: Secondary | ICD-10-CM | POA: Diagnosis not present

## 2020-11-08 DIAGNOSIS — I252 Old myocardial infarction: Secondary | ICD-10-CM | POA: Diagnosis not present

## 2020-11-08 DIAGNOSIS — L988 Other specified disorders of the skin and subcutaneous tissue: Secondary | ICD-10-CM | POA: Diagnosis not present

## 2020-11-08 DIAGNOSIS — I5032 Chronic diastolic (congestive) heart failure: Secondary | ICD-10-CM | POA: Diagnosis not present

## 2020-11-08 DIAGNOSIS — E785 Hyperlipidemia, unspecified: Secondary | ICD-10-CM | POA: Diagnosis not present

## 2020-11-08 DIAGNOSIS — K222 Esophageal obstruction: Secondary | ICD-10-CM | POA: Diagnosis not present

## 2020-11-08 DIAGNOSIS — E039 Hypothyroidism, unspecified: Secondary | ICD-10-CM | POA: Diagnosis not present

## 2020-11-08 DIAGNOSIS — Z87440 Personal history of urinary (tract) infections: Secondary | ICD-10-CM | POA: Diagnosis not present

## 2020-11-08 DIAGNOSIS — Z7951 Long term (current) use of inhaled steroids: Secondary | ICD-10-CM | POA: Diagnosis not present

## 2020-11-08 DIAGNOSIS — I110 Hypertensive heart disease with heart failure: Secondary | ICD-10-CM | POA: Diagnosis not present

## 2020-11-08 DIAGNOSIS — D649 Anemia, unspecified: Secondary | ICD-10-CM | POA: Diagnosis not present

## 2020-11-08 DIAGNOSIS — M7502 Adhesive capsulitis of left shoulder: Secondary | ICD-10-CM | POA: Diagnosis not present

## 2020-11-08 DIAGNOSIS — M5459 Other low back pain: Secondary | ICD-10-CM | POA: Diagnosis not present

## 2020-11-08 DIAGNOSIS — K760 Fatty (change of) liver, not elsewhere classified: Secondary | ICD-10-CM | POA: Diagnosis not present

## 2020-11-08 DIAGNOSIS — Z7982 Long term (current) use of aspirin: Secondary | ICD-10-CM | POA: Diagnosis not present

## 2020-11-08 DIAGNOSIS — F32A Depression, unspecified: Secondary | ICD-10-CM | POA: Diagnosis not present

## 2020-11-08 DIAGNOSIS — Z9981 Dependence on supplemental oxygen: Secondary | ICD-10-CM | POA: Diagnosis not present

## 2020-11-08 DIAGNOSIS — K219 Gastro-esophageal reflux disease without esophagitis: Secondary | ICD-10-CM | POA: Diagnosis not present

## 2020-11-08 DIAGNOSIS — J9611 Chronic respiratory failure with hypoxia: Secondary | ICD-10-CM | POA: Diagnosis not present

## 2020-11-08 NOTE — Patient Outreach (Addendum)
[facility]) Care Management  [facility] Care Manager  11/08/2020   MLISSA TAMAYO Feb 07, 1924 [id number]  RN Health Coach telephone call to patient son [name].  [employer] compliance verified. Per [name] and [facility] nurse the patient BP is 158/60. Her appetite is good. Patient son is weighing her daily. Her weight is ranging from 157-161 lbs. Patient is not on diuretics. Patient has not had any recent falls. She is taking all medications as per ordered. Patient is using oxygen continuously Patient has received her flu shot. Son has concerns about wanting a care giver. Patient is receiving a [facility] nurse visits. Patient and family have agreed to further outreach calls.   Encounter Medications:  Outpatient Encounter Medications as of 11/08/2020  Medication Sig  . albuterol (PROVENTIL) (2.5 MG/3ML) 0.083% nebulizer solution USE 3 ML VIA NEBULIZER EVERY 4 HOURS AS NEEDED FOR WHEEZING OR SHORTNESS OF BREATH  . ALPRAZolam (XANAX) 1 MG tablet Take 1 tablet (1 mg total) by mouth 2 (two) times daily as needed for anxiety.  Marland Kitchen amLODipine (NORVASC) 10 MG tablet TAKE 1 TABLET(10 MG) BY MOUTH DAILY  . Aspirin 81 MG EC tablet Take 81 mg by mouth daily.  Marland Kitchen azelastine (ASTELIN) 0.1 % nasal spray Place 1 spray into both nostrils 2 (two) times daily. Use in each nostril as directed  . cetirizine (ZYRTEC) 10 MG tablet Take 10 mg by mouth daily.  . clobetasol ointment (TEMOVATE) 0.05 % Apply to affected area every night for 4 weeks, then every other day for 4 weeks and then twice a week for 4 weeks or until resolution.  . clotrimazole-betamethasone (LOTRISONE) cream Apply 1 application topically 2 (two) times daily.  Marland Kitchen demeclocycline (DECLOMYCIN) 300 MG tablet Take 0.5 tablets (150 mg total) by mouth 2 (two) times daily.  . diclofenac sodium (VOLTAREN) 1 % GEL Apply 2 g topically 4 (four) times daily.  . famotidine (PEPCID) 20 MG tablet Take 1 tablet (20 mg total) by mouth 2 (two) times daily.  .  fluticasone (FLONASE) 50 MCG/ACT nasal spray SHAKE LIQUID AND USE 1 SPRAY IN EACH NOSTRIL TWICE DAILY  . glycerin adult 2 g suppository Place 1 suppository rectally as needed for constipation.  Marland Kitchen guaiFENesin (MUCINEX) 600 MG 12 hr tablet Take 1 tablet (600 mg total) by mouth 2 (two) times daily as needed for to loosen phlegm.  . hydrALAZINE (APRESOLINE) 10 MG tablet Take 1 tablet (10 mg total) by mouth daily as needed (take if BP > 150/90).  Marland Kitchen ipratropium (ATROVENT) 0.03 % nasal spray USE 2 SPRAYS IN EACH NOSTRIL TWICE DAILY  . levothyroxine (SYNTHROID) 50 MCG tablet TAKE 1 TABLET(50 MCG) BY MOUTH DAILY BEFORE BREAKFAST  . meclizine (ANTIVERT) 12.5 MG tablet Take 1 tablet (12.5 mg total) by mouth 3 (three) times daily as needed for dizziness.  Marland Kitchen olopatadine (PATANOL) 0.1 % ophthalmic solution Place 1 drop into both eyes daily as needed for allergies.  Marland Kitchen omeprazole (PRILOSEC) 20 MG capsule Take 1 capsule (20 mg total) by mouth daily AND 2 capsules (40 mg total) at bedtime.  . propranolol ER (INDERAL LA) 60 MG 24 hr capsule TAKE 1 CAPSULE(60 MG) BY MOUTH DAILY  . Respiratory Therapy Supplies (NEBULIZER) DEVI Use with nebulized albuterol solution as needed for wheezing, cough or shortness of breath  . senna (SENOKOT) 8.6 MG TABS tablet Take 1 tablet (8.6 mg total) by mouth daily.  . sodium chloride 1 g tablet Take 2 tablets (2 g total) by mouth 2 (two) times daily.  Marland Kitchen  sucralfate (CARAFATE) 1 GM/10ML suspension Take 10 mLs (1 g total) by mouth 3 (three) times daily before meals for 14 days.   No facility-administered encounter medications on file as of 11/08/2020.    Functional Status:  No flowsheet data found.  Fall/Depression Screening: Fall Risk  11/08/2020 10/25/2020 09/22/2020  Falls in the past year? 0 0 1  Number falls in past yr: 0 0 1  Injury with Fall? 0 0 0  Risk for fall due to : Impaired balance/gait;Impaired mobility - -  Risk for fall due to: Comment - - -  Follow up Falls  evaluation completed Falls evaluation completed Falls evaluation completed   PHQ 2/9 Scores 10/25/2020 09/22/2020 06/27/2020 02/09/2020 01/28/2020 01/12/2020 12/10/2019  PHQ - 2 Score 0 0 0 0 0 0 0  PHQ- 9 Score - - - - - - -    Assessment:  Goals Addressed            This Visit's Progress   . (THN)Make and Keep All Appointments       Timeframe:  Long-Range Goal Priority:  Medium Start Date:                             Expected End Date: [id number]                       - call to cancel if needed - keep a calendar with prescription refill dates - keep a calendar with appointment dates    Why is this important?    Part of staying healthy is seeing the doctor for follow-up care.   If you forget your appointments, there are some things you can do to stay on track.    Notes:     . (THN)Track and Manage Fluids and Swelling-Heart Failure       Timeframe:  Short-Term Goal Priority:  High Start Date:                             Expected End Date:   [id number]                      - call office if I gain more than 2 pounds in one day or 5 pounds in one week - keep legs up while sitting - use salt in moderation - watch for swelling in feet, ankles and legs every day - weigh myself daily    Why is this important?    It is important to check your weight daily and watch how much salt and liquids you have.   It will help you to manage your heart failure.    Notes:     . (THN)Track and Manage Symptoms-Heart Failure       Timeframe:  Long-Range Goal Priority:  High Start Date:                             Expected End Date:     [id number]                     - develop a rescue plan - follow rescue plan if symptoms flare-up - know when to call the doctor - track symptoms and what helps feel better or worse - dress right for the weather, hot or cold  Why is this important?    You will be able to handle your symptoms better if you keep track of them.   Making some simple  changes to your lifestyle will help.   Eating healthy is one thing you can do to take good care of yourself.    Notes:        Plan:  Follow-up:  Patient agrees to Care Plan and Follow-up. Patient son will continue to monitor patient weight.  Patient son will utilize oxygen safety Patient son will monitor for symptoms of CHF exacerbation RN will follow up outreach within the month of  March RN sent update assessment to PCP  Gean Maidens BSN RN [facility] Care Management [phone]

## 2020-11-09 ENCOUNTER — Other Ambulatory Visit: Payer: Self-pay | Admitting: Family Medicine

## 2020-11-09 DIAGNOSIS — I5032 Chronic diastolic (congestive) heart failure: Secondary | ICD-10-CM

## 2020-11-09 NOTE — Patient Instructions (Signed)
Goals Addressed            This Visit's Progress   . (THN)Make and Keep All Appointments       Timeframe:  Long-Range Goal Priority:  Medium Start Date:                             Expected End Date: [id number]                       - call to cancel if needed - keep a calendar with prescription refill dates - keep a calendar with appointment dates    Why is this important?    Part of staying healthy is seeing the doctor for follow-up care.   If you forget your appointments, there are some things you can do to stay on track.    Notes:     . (THN)Track and Manage Fluids and Swelling-Heart Failure       Timeframe:  Short-Term Goal Priority:  High Start Date:                             Expected End Date:   [id number]                      - call office if I gain more than 2 pounds in one day or 5 pounds in one week - keep legs up while sitting - use salt in moderation - watch for swelling in feet, ankles and legs every day - weigh myself daily    Why is this important?    It is important to check your weight daily and watch how much salt and liquids you have.   It will help you to manage your heart failure.    Notes:     . (THN)Track and Manage Symptoms-Heart Failure       Timeframe:  Long-Range Goal Priority:  High Start Date:                             Expected End Date:     [id number]                     - develop a rescue plan - follow rescue plan if symptoms flare-up - know when to call the doctor - track symptoms and what helps feel better or worse - dress right for the weather, hot or cold    Why is this important?    You will be able to handle your symptoms better if you keep track of them.   Making some simple changes to your lifestyle will help.   Eating healthy is one thing you can do to take good care of yourself.    Notes:

## 2020-11-16 ENCOUNTER — Ambulatory Visit: Payer: Medicare Other | Admitting: Internal Medicine

## 2020-11-16 NOTE — Progress Notes (Deleted)
Name: Tammy Fernandez  MRN/ DOB: [id number], 08-07-1924    Age/ Sex: 86 y.o., female    PCP: Just, Azalee Course, FNP   Reason for Endocrinology Evaluation: Hyponatremia      Date of Initial Endocrinology Evaluation: 11/16/2020     HPI: Tammy Fernandez is a 86 y.o. female with a past medical history of CHF, HTN, chronic respiratory failure on Oxygen Hx of CVA and chronic hyponatremia. The patient presented for initial endocrinology clinic visit on 11/16/2020 for consultative assistance with her hyponatremia   Pt with chronic hyponatremia for years going back to 2014 , with a nadir of 108 in 2019 .  Historically this has been attributed to SIADH, but in review of her urine chemistry she has had elevated urinary Osm at 655 but with a < 20 mEq/L  In 2015 , 2016. In 12/2017  She had again low urine sodium < 10 , but the following  days repeat urine sodium has increased to 113  After IV fluid administration.   HISTORY:  Past Medical History:  Past Medical History:  Diagnosis Date  . Anxiety   . Chronic lower back pain   . Constipation    h/o fecal disimpaction on 2017  . CVA (cerebral vascular accident) (HCC) 2015   neg workup, MRI small acute lacunar infarcts. ASA only  . Depression   . Diastolic CHF, chronic (HCC)    grade I, most recent echo Jan 2019  . Fatty liver   . GERD (gastroesophageal reflux disease)   . Hyperlipidemia   . Hypertension   . Hyponatremia    multiple episodes with encephalopathy, renal thinks SIADH  . Hypothyroidism, unspecified   . Internal hemorrhoid   . NSTEMI (non-ST elevated myocardial infarction) (HCC) 08/28/2016  . Palpitations   . PFO with atrial septal aneurysm    TTE 2015  . Pneumonia 2016  . Severe pulmonary arterial systolic hypertension (HCC) 12/25/2017   per echo, on 1L oxgen via [place]    Past Surgical History:  Past Surgical History:  Procedure Laterality Date  . ABDOMINAL HYSTERECTOMY    . CATARACT EXTRACTION W/ INTRAOCULAR LENS   IMPLANT, BILATERAL Bilateral   . CESAREAN SECTION    . TEE WITHOUT CARDIOVERSION N/A 01/19/2014   Procedure: TRANSESOPHAGEAL ECHOCARDIOGRAM (TEE);  Surgeon: Laurey Morale, MD;  Location[facility] ENDOSCOPY;  Service: Cardiovascular;  Laterality: N/A;  [name]  . VAGINAL HYSTERECTOMY        Social History:  reports that she has never smoked. She has quit using smokeless tobacco.  Her smokeless tobacco use included snuff. She reports that she does not drink alcohol and does not use drugs.  Family History: family history includes Colon cancer in her cousin and another family member; Diabetes in an other family member; Healthy in her daughter and son; Heart disease in an other family member; Hypertension in her father; Stroke in her father.   HOME MEDICATIONS: Allergies as of 11/16/2020      Reactions   Crestor [rosuvastatin Calcium] Other (See Comments)   Muscle Aches   Keflex [cephalexin] Other (See Comments)   dizziness   Peanut Butter Flavor Hives   Sulfa Antibiotics Hives   Cephalexin Other (See Comments)   dizziness   Sulfa Drugs Cross Reactors Rash      Medication List       Accurate as of November 16, 2020  7:52 AM. If you have any questions, ask your nurse or doctor.  albuterol (2.5 MG/3ML) 0.083% nebulizer solution Commonly known as: PROVENTIL USE 3 ML VIA NEBULIZER EVERY 4 HOURS AS NEEDED FOR WHEEZING OR SHORTNESS OF BREATH   ALPRAZolam 1 MG tablet Commonly known as: XANAX Take 1 tablet (1 mg total) by mouth 2 (two) times daily as needed for anxiety.   amLODipine 10 MG tablet Commonly known as: NORVASC TAKE 1 TABLET(10 MG) BY MOUTH DAILY   Aspirin 81 MG EC tablet Take 81 mg by mouth daily.   azelastine 0.1 % nasal spray Commonly known as: ASTELIN Place 1 spray into both nostrils 2 (two) times daily. Use in each nostril as directed   cetirizine 10 MG tablet Commonly known as: ZYRTEC Take 10 mg by mouth daily.   clobetasol ointment 0.05 % Commonly  known as: TEMOVATE Apply to affected area every night for 4 weeks, then every other day for 4 weeks and then twice a week for 4 weeks or until resolution.   clotrimazole-betamethasone cream Commonly known as: LOTRISONE Apply 1 application topically 2 (two) times daily.   demeclocycline 300 MG tablet Commonly known as: DECLOMYCIN Take 0.5 tablets (150 mg total) by mouth 2 (two) times daily.   diclofenac sodium 1 % Gel Commonly known as: VOLTAREN Apply 2 g topically 4 (four) times daily.   famotidine 20 MG tablet Commonly known as: PEPCID Take 1 tablet (20 mg total) by mouth 2 (two) times daily.   fluticasone 50 MCG/ACT nasal spray Commonly known as: FLONASE SHAKE LIQUID AND USE 1 SPRAY IN EACH NOSTRIL TWICE DAILY   glycerin adult 2 g suppository Place 1 suppository rectally as needed for constipation.   guaiFENesin 600 MG 12 hr tablet Commonly known as: MUCINEX Take 1 tablet (600 mg total) by mouth 2 (two) times daily as needed for to loosen phlegm.   hydrALAZINE 10 MG tablet Commonly known as: APRESOLINE Take 1 tablet (10 mg total) by mouth daily as needed (take if BP > 150/90).   ipratropium 0.03 % nasal spray Commonly known as: ATROVENT USE 2 SPRAYS IN EACH NOSTRIL TWICE DAILY   levothyroxine 50 MCG tablet Commonly known as: SYNTHROID TAKE 1 TABLET(50 MCG) BY MOUTH DAILY BEFORE BREAKFAST   meclizine 12.5 MG tablet Commonly known as: ANTIVERT Take 1 tablet (12.5 mg total) by mouth 3 (three) times daily as needed for dizziness.   Nebulizer Devi Use with nebulized albuterol solution as needed for wheezing, cough or shortness of breath   olopatadine 0.1 % ophthalmic solution Commonly known as: PATANOL Place 1 drop into both eyes daily as needed for allergies.   omeprazole 20 MG capsule Commonly known as: PRILOSEC Take 1 capsule (20 mg total) by mouth daily AND 2 capsules (40 mg total) at bedtime.   propranolol ER 60 MG 24 hr capsule Commonly known as: INDERAL  LA TAKE 1 CAPSULE(60 MG) BY MOUTH DAILY   senna 8.6 MG Tabs tablet Commonly known as: SENOKOT Take 1 tablet (8.6 mg total) by mouth daily.   sodium chloride 1 g tablet Take 2 tablets (2 g total) by mouth 2 (two) times daily.   sucralfate 1 GM/10ML suspension Commonly known as: Carafate Take 10 mLs (1 g total) by mouth 3 (three) times daily before meals for 14 days.         REVIEW OF SYSTEMS: A comprehensive ROS was conducted with the patient and is negative except as per HPI and below:  ROS     OBJECTIVE:  VS: There were no vitals taken for this visit.  Wt Readings from Last 3 Encounters:  10/25/20 157 lb (71.2 kg)  10/05/20 157 lb (71.2 kg)  09/22/20 157 lb (71.2 kg)     EXAM: General: Pt appears well and is in NAD  Hydration: Well-hydrated with moist mucous membranes and good skin turgor  Eyes: External eye exam normal without stare, lid lag or exophthalmos.  EOM intact.  PERRL.  Ears, Nose, Throat: Hearing: Grossly intact bilaterally Dental: Good dentition  Throat: Clear without mass, erythema or exudate  Neck: General: Supple without adenopathy. Thyroid: Thyroid size normal.  No goiter or nodules appreciated. No thyroid bruit.  Lungs: Clear with good BS bilat with no rales, rhonchi, or wheezes  Heart: Auscultation: RRR.  Abdomen: Normoactive bowel sounds, soft, nontender, without masses or organomegaly palpable  Extremities: Gait and station: Normal gait  Digits and nails: No clubbing, cyanosis, petechiae, or nodes Head and neck: Normal alignment and mobility BL UE: Normal ROM and strength. BL LE: No pretibial edema normal ROM and strength.  Skin: Hair: Texture and amount normal with gender appropriate distribution Skin Inspection: No rashes, acanthosis nigricans/skin tags. No lipohypertrophy Skin Palpation: Skin temperature, texture, and thickness normal to palpation  Neuro: Cranial nerves: II - XII grossly intact  Cerebellar: Normal coordination and  movement; no tremor Motor: Normal strength throughout DTRs: 2+ and symmetric in UE without delay in relaxation phase  Mental Status: Judgment, insight: Intact Orientation: Oriented to time, place, and person Memory: Intact for recent and remote events Mood and affect: No depression, anxiety, or agitation     DATA REVIEWED: ***    ASSESSMENT/PLAN/RECOMMENDATIONS:   1. ***    Medications :  Signed electronically by: Lyndle Herrlich, MD  [facility] Endocrinology  [facility] [address] [place] [place], [place] [address] Phone: [phone] FAX: [phone]   CC: Tobie Poet, FNP [address] [place] [address] Phone: [phone] Fax: [phone]   Return to Endocrinology clinic as below: Future Appointments  Date Time Provider Department Center  11/16/2020  3:00 PM Caidence Higashi, Konrad Dolores, MD [facility] None  11/17/2020  3:00 PM Just, Azalee Course, FNP P[facility]PCP [facility]  01/04/2021  2:00 PM Glenford Bayley, NP [facility] None  03/01/2021  2:00 PM Pleasant, Dennard Schaumann, RN [facility] None

## 2020-11-17 ENCOUNTER — Telehealth: Payer: Self-pay | Admitting: Family Medicine

## 2020-11-17 ENCOUNTER — Ambulatory Visit: Payer: Medicare Other | Admitting: Family Medicine

## 2020-11-17 ENCOUNTER — Other Ambulatory Visit: Payer: Self-pay

## 2020-11-17 DIAGNOSIS — R059 Cough, unspecified: Secondary | ICD-10-CM

## 2020-11-17 MED ORDER — DEMECLOCYCLINE HCL 300 MG PO TABS: 150.0000 mg | ORAL_TABLET | Freq: Two times a day (BID) | ORAL | 5 refills | Status: AC

## 2020-11-17 MED ORDER — ALBUTEROL SULFATE (2.5 MG/3ML) 0.083% IN NEBU: INHALATION_SOLUTION | RESPIRATORY_TRACT | 2 refills | Status: DC

## 2020-11-17 NOTE — Telephone Encounter (Signed)
I don't think she needs these labs. I can do them at her next appointment end of December. We can also do the Xray that time if needed.

## 2020-11-17 NOTE — Telephone Encounter (Signed)
[facility] nurse called to follow up to  see if we would be faxing orders for this patient / read NP Just notes  To nurse .

## 2020-11-17 NOTE — Telephone Encounter (Signed)
Tammy Fernandez with [facility] is calling because patients Son is insisting that she draw labs on patient .Son  Fixated on Co2 levels / and Sodium .Son is asking home health nurse  doing labs once a month. Tammy Fernandez is calling because she really doesn't feel patient is not in distress .   Please reach out to [place] [phone]

## 2020-11-17 NOTE — Telephone Encounter (Signed)
Patients son called to cancel appt for today ans is requesting that Macario Carls Just to put in an order to [facility] to check Sodium / CO and potasium   Also to let us know that patient is out of   albuterol (PROVENTIL) (2.5 MG/3ML) 0.083% nebulizer solution [[id number]]  And  demeclocycline (DECLOMYCIN) 300 MG tablet [[id number]]     Please send  [id number] Ginette Otto, [place] - [address] AT [facility] [address] & [facility]  [address], [place] [address]  Phone:  [phone] Fax:  [phone]  DEA #:  [id number]

## 2020-11-17 NOTE — Telephone Encounter (Signed)
Spoke with [name] , pt son states [facility] is coming out today around 2:30 pm and he would like orders placed for possible low potasium, sodium, and Co2 level. Her symptoms of diarrhea, coughing , more frequent urination and fluctuating O2 sats 94 to 95 and sometimes upper 80's. [facility] is not able to do xrays due to stairs . Orders for labs or other tests [facility] is able to perform can be called in or faxed . Pt has an appt set for 12/01/20 . Please advise

## 2020-11-30 DIAGNOSIS — R7881 Bacteremia: Secondary | ICD-10-CM | POA: Diagnosis not present

## 2020-11-30 DIAGNOSIS — B9562 Methicillin resistant Staphylococcus aureus infection as the cause of diseases classified elsewhere: Secondary | ICD-10-CM | POA: Diagnosis not present

## 2020-11-30 DIAGNOSIS — I6789 Other cerebrovascular disease: Secondary | ICD-10-CM | POA: Diagnosis not present

## 2020-11-30 DIAGNOSIS — J9601 Acute respiratory failure with hypoxia: Secondary | ICD-10-CM | POA: Diagnosis not present

## 2020-12-01 ENCOUNTER — Ambulatory Visit: Payer: Medicare Other | Admitting: Family Medicine

## 2020-12-06 ENCOUNTER — Ambulatory Visit: Payer: Medicare Other | Admitting: Family Medicine

## 2020-12-06 DIAGNOSIS — E871 Hypo-osmolality and hyponatremia: Secondary | ICD-10-CM | POA: Diagnosis not present

## 2020-12-06 DIAGNOSIS — E876 Hypokalemia: Secondary | ICD-10-CM | POA: Diagnosis not present

## 2020-12-06 DIAGNOSIS — I5032 Chronic diastolic (congestive) heart failure: Secondary | ICD-10-CM | POA: Diagnosis not present

## 2020-12-06 DIAGNOSIS — Z1159 Encounter for screening for other viral diseases: Secondary | ICD-10-CM | POA: Diagnosis not present

## 2020-12-06 DIAGNOSIS — I10 Essential (primary) hypertension: Secondary | ICD-10-CM | POA: Diagnosis not present

## 2020-12-12 ENCOUNTER — Telehealth (INDEPENDENT_AMBULATORY_CARE_PROVIDER_SITE_OTHER): Payer: Medicare Other | Admitting: Cardiology

## 2020-12-12 ENCOUNTER — Encounter: Payer: Self-pay | Admitting: Cardiology

## 2020-12-12 VITALS — BP 143/55 | HR 65 | Ht 62.0 in | Wt 163.0 lb

## 2020-12-12 DIAGNOSIS — Z9981 Dependence on supplemental oxygen: Secondary | ICD-10-CM | POA: Diagnosis not present

## 2020-12-12 DIAGNOSIS — I2721 Secondary pulmonary arterial hypertension: Secondary | ICD-10-CM | POA: Diagnosis not present

## 2020-12-12 DIAGNOSIS — I10 Essential (primary) hypertension: Secondary | ICD-10-CM | POA: Diagnosis not present

## 2020-12-12 DIAGNOSIS — Z8673 Personal history of transient ischemic attack (TIA), and cerebral infarction without residual deficits: Secondary | ICD-10-CM

## 2020-12-12 DIAGNOSIS — J9611 Chronic respiratory failure with hypoxia: Secondary | ICD-10-CM | POA: Diagnosis not present

## 2020-12-12 NOTE — Progress Notes (Signed)
Virtual Visit via Telephone Note   This visit type was conducted due to national recommendations for restrictions regarding the COVID-19 Pandemic (e.g. social distancing) in an effort to limit this patient's exposure and mitigate transmission in our community.  Due to her co-morbid illnesses, this patient is at least at moderate risk for complications without adequate follow up.  This format is felt to be most appropriate for this patient at this time.  The patient did not have access to video technology/had technical difficulties with video requiring transitioning to audio format only (telephone).  All issues noted in this document were discussed and addressed.  No physical exam could be performed with this format.  Please refer to the patient's chart for her  consent to telehealth for [facility].    Date:  12/12/2020   ID:  Tammy Fernandez, DOB March 07, 1924, MRN [id number] The patient was identified using 2 identifiers.  Patient Location: Home Provider Location: Home Office  PCP:  Just, Azalee Course, FNP  Cardiologist:  Nanetta Batty, MD  Electrophysiologist:  None   Evaluation Performed:  Follow-Up Visit  Chief Complaint:  none  History of Present Illness:    Tammy Fernandez is a 87 y.o. female with a hx of DCHF, Pulmonary HTN, chronic respiratory failure on O2 and C-pap, and chronic hyponatremia. In Jan 2019 she presented with respiratory failure failure felt to be secondary to diastolic CHF and pulmonary HTN. She was also markedly hyponatremicthen.Echo Jan 2019 showed EF 60-65% with grade 2 DD and significant pulmonary HTN with a PA pressure 72 mmHg.She is on chronic O2. She is followed by [facility]. Her mobility is limited, mainly wheel chairbut she does do some walking with a cane.  She was admitted through the ED 12/27/2018-to 01/08/2019 with lethargy and confusion.She was markedly hyponatremic and hypertensive. She apparently had not been eating well at home and not  taking her medications. She had a complicated hospitalization. She was seen by the nephrology service,the GI service,the neurology service, and CCM. Her course was complicated by hypercarbia, aspiration,and hospital-acquired pneumonia. She was seen by palliative care. The patient and her family do not wish her to be a DNR. Initially the plans were to discharge her to a skilled nursing facility but the family wanted to take her home.   She actually did quite well at home.  Her son Tammy Fernandez cares for her.  She was in the office in July 2021 and was doing OK.  I spoke with her son today on the phone.  Recent labs showed a Na+ of 130 and CO2 of 39.  He tells me he had to increase her O2 at time to 2.5L.  He says its been hard to keep her active. Otherwise her B/P and HR have been stable.  She has been vaccinated and will get her booster in Feb 2022.  The patient does not have symptoms concerning for COVID-19 infection (fever, chills, cough, or new shortness of breath).    Past Medical History:  Diagnosis Date  . Anxiety   . Chronic lower back pain   . Constipation    h/o fecal disimpaction on 2017  . CVA (cerebral vascular accident) (HCC) 2015   neg workup, MRI small acute lacunar infarcts. ASA only  . Depression   . Diastolic CHF, chronic (HCC)    grade I, most recent echo Jan 2019  . Fatty liver   . GERD (gastroesophageal reflux disease)   . Hyperlipidemia   . Hypertension   .  Hyponatremia    multiple episodes with encephalopathy, renal thinks SIADH  . Hypothyroidism, unspecified   . Internal hemorrhoid   . NSTEMI (non-ST elevated myocardial infarction) (HCC) 08/28/2016  . Palpitations   . PFO with atrial septal aneurysm    TTE 2015  . Pneumonia 2016  . Severe pulmonary arterial systolic hypertension (HCC) 12/25/2017   per echo, on 1L oxgen via [place]   Past Surgical History:  Procedure Laterality Date  . ABDOMINAL HYSTERECTOMY    . CATARACT EXTRACTION W/ INTRAOCULAR LENS   IMPLANT, BILATERAL Bilateral   . CESAREAN SECTION    . TEE WITHOUT CARDIOVERSION N/A 01/19/2014   Procedure: TRANSESOPHAGEAL ECHOCARDIOGRAM (TEE);  Surgeon: Laurey Moralealton S McLean, MD;  [facility];  Service: Cardiovascular;  Laterality: N/A;  [name]/ja  . VAGINAL HYSTERECTOMY       Current Meds  Medication Sig  . albuterol (PROVENTIL) (2.5 MG/3ML) 0.083% nebulizer solution USE 3 ML VIA NEBULIZER EVERY 4 HOURS AS NEEDED FOR WHEEZING OR SHORTNESS OF BREATH  . ALPRAZolam (XANAX) 1 MG tablet Take 1 tablet (1 mg total) by mouth 2 (two) times daily as needed for anxiety.  Marland Kitchen. amLODipine (NORVASC) 10 MG tablet TAKE 1 TABLET(10 MG) BY MOUTH DAILY  . Aspirin 81 MG EC tablet Take 81 mg by mouth daily.  Marland Kitchen. azelastine (ASTELIN) 0.1 % nasal spray Place 1 spray into both nostrils 2 (two) times daily. Use in each nostril as directed  . cetirizine (ZYRTEC) 10 MG tablet Take 10 mg by mouth daily.  . clotrimazole-betamethasone (LOTRISONE) cream Apply 1 application topically 2 (two) times daily.  Marland Kitchen. demeclocycline (DECLOMYCIN) 300 MG tablet Take 0.5 tablets (150 mg total) by mouth 2 (two) times daily.  . diclofenac sodium (VOLTAREN) 1 % GEL Apply 2 g topically 4 (four) times daily.  . famotidine (PEPCID) 20 MG tablet Take 1 tablet (20 mg total) by mouth 2 (two) times daily.  . fluticasone (FLONASE) 50 MCG/ACT nasal spray SHAKE LIQUID AND USE 1 SPRAY IN EACH NOSTRIL TWICE DAILY  . glycerin adult 2 g suppository Place 1 suppository rectally as needed for constipation.  Marland Kitchen. guaiFENesin (MUCINEX) 600 MG 12 hr tablet Take 1 tablet (600 mg total) by mouth 2 (two) times daily as needed for to loosen phlegm.  . hydrALAZINE (APRESOLINE) 10 MG tablet Take 1 tablet (10 mg total) by mouth daily as needed (take if BP > 150/90).  Marland Kitchen. ipratropium (ATROVENT) 0.03 % nasal spray USE 2 SPRAYS IN EACH NOSTRIL TWICE DAILY  . levothyroxine (SYNTHROID) 50 MCG tablet TAKE 1 TABLET(50 MCG) BY MOUTH DAILY BEFORE BREAKFAST  . meclizine  (ANTIVERT) 12.5 MG tablet Take 1 tablet (12.5 mg total) by mouth 3 (three) times daily as needed for dizziness.  Marland Kitchen. olopatadine (PATANOL) 0.1 % ophthalmic solution Place 1 drop into both eyes daily as needed for allergies.  Marland Kitchen. omeprazole (PRILOSEC) 20 MG capsule Take 20 mg by mouth 2 (two) times daily.  . propranolol ER (INDERAL LA) 60 MG 24 hr capsule TAKE 1 CAPSULE(60 MG) BY MOUTH DAILY  . Respiratory Therapy Supplies (NEBULIZER) DEVI Use with nebulized albuterol solution as needed for wheezing, cough or shortness of breath  . senna (SENOKOT) 8.6 MG TABS tablet Take 1 tablet (8.6 mg total) by mouth daily.  . sodium chloride 1 g tablet Take 2 tablets (2 g total) by mouth 2 (two) times daily.  . [DISCONTINUED] omeprazole (PRILOSEC) 20 MG capsule Take 1 capsule (20 mg total) by mouth daily AND 2 capsules (40 mg  total) at bedtime. (Patient taking differently: Take 1 capsule (20 mg total) by mouth twice a day)     Allergies:   Crestor [rosuvastatin calcium], Keflex [cephalexin], Peanut butter flavor, Sulfa antibiotics, Cephalexin, and Sulfa drugs cross reactors   Social History   Tobacco Use  . Smoking status: Never Smoker  . Smokeless tobacco: Former Neurosurgeon    Types: Snuff  . Tobacco comment: "used snuff when I was 19"  Vaping Use  . Vaping Use: Never used  Substance Use Topics  . Alcohol use: No  . Drug use: No     Family Hx: The patient's family history includes Colon cancer in her cousin and another family member; Diabetes in an other family member; Healthy in her daughter and son; Heart disease in an other family member; Hypertension in her father; Stroke in her father.  ROS:   Please see the history of present illness.    All other systems reviewed and are negative.   Prior CV studies:   The following studies were reviewed today:    Labs/Other Tests and Data Reviewed:    EKG:  An ECG dated 06/20/2020 was personally reviewed today and demonstrated:  NSR  Recent  Labs: 09/22/2020: BUN 14; Creatinine, Ser 0.67; Hemoglobin 10.2; Platelets 235; Potassium 4.9; Sodium 135; TSH 2.080   Recent Lipid Panel Lab Results  Component Value Date/Time   CHOL 181 08/29/2016 04:34 AM   TRIG 98 08/29/2016 04:34 AM   HDL 37 (L) 08/29/2016 04:34 AM   CHOLHDL 4.9 08/29/2016 04:34 AM   LDLCALC 124 (H) 08/29/2016 04:34 AM    Wt Readings from Last 3 Encounters:  12/12/20 163 lb (73.9 kg)  10/25/20 157 lb (71.2 kg)  10/05/20 157 lb (71.2 kg)     Risk Assessment/Calculations:      Objective:    Vital Signs:  BP (!) 143/55   Pulse 65   Ht 5\' 2"  (1.575 m)   Wt 163 lb (73.9 kg)   BMI 29.81 kg/m    VITAL SIGNS:  reviewed  ASSESSMENT & PLAN:    Chronic respiratory failure with hypoxia (HCC) Followed by Dr - pt has severe pulmonary HTN and is on chronic O2.  History of cest pain, atypical Low risk Myoview 2014 (Dr Marchelle Gearing) . Not felt to be a candidate for further testing when admitted in Sept 2017 (Dr 04-24-1996).  Chronic diastolic CHF (congestive heart failure) (HCC) Echo Jan 2019-EF 60-65% with grade 2 DD and severe pulmonary HTN (PA 72 mmHg)  History of hyponatremia Jan 2019and again Jan 2020. She has been on chronic Demeclocycline   History of CVA (cerebrovascular accident) H/O CVA 2015  Severe pulmonary arterial systolic hypertension (HCC) PA pressure 72 mmHg        COVID-19 Education: The signs and symptoms of COVID-19 were discussed with the patient and how to seek care for testing (follow up with PCP or arrange E-visit).  The importance of social distancing was discussed today.  Time:   Today, I have spent 10 minutes with the patient with telehealth technology discussing the above problems.     Medication Adjustments/Labs and Tests Ordered: Current medicines are reviewed at length with the patient today.  Concerns regarding medicines are outlined above.   Tests Ordered: No orders of the defined types were placed in  this encounter.   Medication Changes: No orders of the defined types were placed in this encounter.   Follow Up:  In Person in 6 months with Dr 2016  Signed,  Corine Shelter, [place]  12/12/2020 12:39 PM     [facility]

## 2020-12-12 NOTE — Patient Instructions (Signed)
Medication Instructions:  °Your Physician recommend you continue on your current medication as directed.   ° °*If you need a refill on your cardiac medications before your next appointment, please call your pharmacy* ° ° °Lab Work: °None ° ° °Testing/Procedures: °None ° ° °Follow-Up: °At [facility], you and your health needs are our priority.  As part of our continuing mission to provide you with exceptional heart care, we have created designated Provider Care Teams.  These Care Teams include your primary Cardiologist (physician) and Advanced Practice Providers (APPs -  Physician Assistants and Nurse Practitioners) who all work together to provide you with the care you need, when you need it. ° °We recommend signing up for the patient portal called "MyChart".  Sign up information is provided on this After Visit Summary.  MyChart is used to connect with patients for Virtual Visits (Telemedicine).  Patients are able to view lab/test results, encounter notes, upcoming appointments, etc.  Non-urgent messages can be sent to your provider as well.   °To learn more about what you can do with MyChart, go to [email or web address].   ° °Your next appointment:   °6 month(s) ° °The format for your next appointment:   °In Person ° °Provider:   °Jonathan Berry, MD ° ° ° °

## 2020-12-16 ENCOUNTER — Other Ambulatory Visit: Payer: Self-pay | Admitting: Emergency Medicine

## 2020-12-16 ENCOUNTER — Telehealth: Payer: Self-pay | Admitting: Pulmonary Disease

## 2020-12-16 NOTE — Telephone Encounter (Signed)
Called and spoke with patient's son [name]. I advised him it looked like someone had called to reschedule his mother's appt on 01/04/21 since Beth would not be here. I was able to get her scheduled for 01/06/21. He verbalized understanding. Nothing further needed at time of call.

## 2020-12-20 DIAGNOSIS — G4733 Obstructive sleep apnea (adult) (pediatric): Secondary | ICD-10-CM | POA: Diagnosis not present

## 2020-12-20 DIAGNOSIS — Z9989 Dependence on other enabling machines and devices: Secondary | ICD-10-CM | POA: Diagnosis not present

## 2020-12-20 DIAGNOSIS — J9601 Acute respiratory failure with hypoxia: Secondary | ICD-10-CM | POA: Diagnosis not present

## 2020-12-20 DIAGNOSIS — I6789 Other cerebrovascular disease: Secondary | ICD-10-CM | POA: Diagnosis not present

## 2020-12-20 DIAGNOSIS — R7881 Bacteremia: Secondary | ICD-10-CM | POA: Diagnosis not present

## 2020-12-31 DIAGNOSIS — R7881 Bacteremia: Secondary | ICD-10-CM | POA: Diagnosis not present

## 2020-12-31 DIAGNOSIS — J9601 Acute respiratory failure with hypoxia: Secondary | ICD-10-CM | POA: Diagnosis not present

## 2020-12-31 DIAGNOSIS — I6789 Other cerebrovascular disease: Secondary | ICD-10-CM | POA: Diagnosis not present

## 2020-12-31 DIAGNOSIS — B9562 Methicillin resistant Staphylococcus aureus infection as the cause of diseases classified elsewhere: Secondary | ICD-10-CM | POA: Diagnosis not present

## 2021-01-04 ENCOUNTER — Ambulatory Visit: Payer: Medicare Other | Admitting: Primary Care

## 2021-01-05 ENCOUNTER — Telehealth: Payer: Self-pay | Admitting: Family Medicine

## 2021-01-05 ENCOUNTER — Other Ambulatory Visit: Payer: Self-pay | Admitting: Family Medicine

## 2021-01-05 DIAGNOSIS — J9611 Chronic respiratory failure with hypoxia: Secondary | ICD-10-CM

## 2021-01-05 NOTE — Telephone Encounter (Signed)
01/05/2021 - I RECEIVED THIS CALL FROM HEATHER HOGAN (CLINICAL MANAGER) FOR [facility] [name]. SHE WENT TO VISIT THE PATIENT FOR THE FIRST TIME. MS. Tammy TOLD HER SHE WOULD LIKE TO HAVE PHYSICAL THERAPY DUE TO HER LEGS BEING VERY WEAK. IT'S HARD FOR HER TO GET TO THE BED SIDE COMMODE. HEATHER WOULD LIKE TO GET AN ORDER FAXED TO [facility]. IF AUTHORIZED SHE WOULD HAVE IT AT [facility]. HEATHER'S PHONE: [phone]   FAX #: [phone]  [facility]

## 2021-01-05 NOTE — Telephone Encounter (Signed)
Physical therapy orders faxed to FAX #: [phone]  [facility].

## 2021-01-06 ENCOUNTER — Ambulatory Visit: Payer: Medicare Other | Admitting: Primary Care

## 2021-01-06 NOTE — Progress Notes (Deleted)
@Patient  ID: , female    DOB: 1924-02-08, 87 y.o.   MRN: [id number]  No chief complaint on file.   Referring provider: Just, [id number], FNP  HPI:  87 year old female. PMH significant for pulmonary HTN, diastolic HF, HTN, NSTEMI, aspiration pneumonia, chronic respiratory failure, GERD, hypothyroidism, SIADH. Patient of Dr. 97 and follows with Dr. Tonia Brooms for OSA.   Previous [facility] pulmonary encounters: OV 09/30/2019- Dr. 10/02/2019 Patient here today for follow-up regarding her pulmonary hypertension.  She also has OSA on CPAP has been on this for several years.  Issues at night include waking up constantly to spit.  She feels like she has trouble controlling her secretions at nighttime.  And routinely she will just spit into her CPAP mask.  She feels like she needs to have this fixed and she hates wearing it she does not wear it anymore.  As for her pulmonary hypertension her fluid management has maintained.  She still requiring oxygen but otherwise no additional change in her respiratory symptoms.  She has not had any significant lower extremity edema.  05/31/2020- NP 06/02/2020 Patient presents today to re-qualify for oxygen. Complains of nasal congestion and dryness. Post nasal drip upsets her stomach. She has a hard time getting mucus up. She is on declomycin 300mg  1/2 tab twice daily for hx hyponatremia.  She had some improvement on Zpack but sinusitis symptoms returned soon after. She also has vertigo symtpoms. She uses 1.5-2L oxygen at home.  Son states her O2 level can be 100% on oxygen at home. Wears CPAP at night with 1.5L oxygen blended into it.  [employer] is her DME company.   10/05/20- Dr. PMh - pulmonary HTN, diastolic HF, HTN, NSTEMI, aspiration pneumonia, chronic respiratory failure, GERD, hypothyroidism, SIADH Last office visit 05/2020 reviewed, oxygen saturation was 88% on room air and she qualified for oxygen improved to 1.5 to 2 L.  I have reviewed prior hospitalization and  discharge summary.  Seems like this patient does not carry a diagnosis of OSA although this has been noted multiple times by previous providers.  She was provided with noninvasive ventilation/trilogy machine after hospitalization February 2020 for  acute hypercarbic respiratory failure .  She has been maintained on NIV since then.  She reports being frustrated with the machine, she does not like the full facemask she has a productive cough and has to spit into her machine and cannot tolerate the full facemask. She has a cough for 3 weeks with clear mucus, Covid testing was negative She is still trying to be compliant with her machine in spite of these issues  Epworth sleepiness score is 8. Bedtime is between 10 and 11 PM, sleep latency is 20 to 30 minutes, reports 1-2 nocturnal awakenings and is out of bed at 8 AM feeling tired without dryness of mouth or headaches  01/06/2021 - interim hx   She is on 1.5L oxygen. Patient has been on Trilogy ventilator since February 2020 after hospitalization for hypecarbic respiratory failure. She has difficulty using full facemask. During last visit she was sent in prescription for new Airfit N 30 nasal cradle mask. Requested download from [employer] for Triology ventilator, encouraged patient to wear 6 hours every night. If not able to tolerate may consider discontinuing and using only oxygen at night.    Any increaser in confusion, increase sleepiness, increase swelling in feet?     09/2020 bicarbonate 35 Serial chest x-rays reviewed which show chronic right elevated hemidiaphragm which  dates back to 2012  Significant tests/ events reviewed ABG 01/2019 7.5 1/50/108 on 3 L nasal cannula  Echo 12/2017 PA pressure 72, grade 2 diastolic dysfunction, moderate TR   Allergies  Allergen Reactions  . Crestor [Rosuvastatin Calcium] Other (See Comments)    Muscle Aches  . Keflex [Cephalexin] Other (See Comments)    dizziness  . Peanut Butter Flavor Hives  . Sulfa  Antibiotics Hives  . Cephalexin Other (See Comments)    dizziness  . Sulfa Drugs Cross Reactors Rash    Immunization History  Administered Date(s) Administered  . Fluad Quad(high Dose 65+) 08/07/2019, 08/12/2020  . Influenza-Unspecified 09/02/2013, 09/15/2018  . PFIZER(Purple Top)SARS-COV-2 Vaccination 06/30/2020, 07/21/2020  . Pneumococcal Conjugate-13 08/05/2018  . Pneumococcal-Unspecified 09/02/2013    Past Medical History:  Diagnosis Date  . Anxiety   . Chronic lower back pain   . Constipation    h/o fecal disimpaction on 2017  . CVA (cerebral vascular accident) (HCC) 2015   neg workup, MRI small acute lacunar infarcts. ASA only  . Depression   . Diastolic CHF, chronic (HCC)    grade I, most recent echo Jan 2019  . Fatty liver   . GERD (gastroesophageal reflux disease)   . Hyperlipidemia   . Hypertension   . Hyponatremia    multiple episodes with encephalopathy, renal thinks SIADH  . Hypothyroidism, unspecified   . Internal hemorrhoid   . NSTEMI (non-ST elevated myocardial infarction) (HCC) 08/28/2016  . Palpitations   . PFO with atrial septal aneurysm    TTE 2015  . Pneumonia 2016  . Severe pulmonary arterial systolic hypertension (HCC) 12/25/2017   per echo, on 1L oxgen via [place]    Tobacco History: Social History   Tobacco Use  Smoking Status Never Smoker  Smokeless Tobacco Former Neurosurgeon  . Types: Snuff  Tobacco Comment   "used snuff when I was 19"   Counseling given: Not Answered Comment: "used snuff when I was 19"   Outpatient Medications Prior to Visit  Medication Sig Dispense Refill  . albuterol (PROVENTIL) (2.5 MG/3ML) 0.083% nebulizer solution USE 3 ML VIA NEBULIZER EVERY 4 HOURS AS NEEDED FOR WHEEZING OR SHORTNESS OF BREATH 75 mL 2  . ALPRAZolam (XANAX) 1 MG tablet Take 1 tablet (1 mg total) by mouth 2 (two) times daily as needed for anxiety. 60 tablet 2  . amLODipine (NORVASC) 10 MG tablet TAKE 1 TABLET(10 MG) BY MOUTH DAILY 90 tablet 3  .  Aspirin 81 MG EC tablet Take 81 mg by mouth daily.    Marland Kitchen azelastine (ASTELIN) 0.1 % nasal spray Place 1 spray into both nostrils 2 (two) times daily. Use in each nostril as directed 30 mL 12  . cetirizine (ZYRTEC) 10 MG tablet Take 10 mg by mouth daily.    . clotrimazole-betamethasone (LOTRISONE) cream Apply 1 application topically 2 (two) times daily. 30 g 2  . demeclocycline (DECLOMYCIN) 300 MG tablet Take 0.5 tablets (150 mg total) by mouth 2 (two) times daily. 60 tablet 5  . diclofenac sodium (VOLTAREN) 1 % GEL Apply 2 g topically 4 (four) times daily. 100 g 5  . famotidine (PEPCID) 20 MG tablet Take 1 tablet (20 mg total) by mouth 2 (two) times daily. 90 tablet 3  . fluticasone (FLONASE) 50 MCG/ACT nasal spray SHAKE LIQUID AND USE 1 SPRAY IN EACH NOSTRIL TWICE DAILY 16 g 5  . glycerin adult 2 g suppository Place 1 suppository rectally as needed for constipation. 12 suppository 2  . guaiFENesin (MUCINEX)  600 MG 12 hr tablet Take 1 tablet (600 mg total) by mouth 2 (two) times daily as needed for to loosen phlegm. 60 tablet 5  . hydrALAZINE (APRESOLINE) 10 MG tablet Take 1 tablet (10 mg total) by mouth daily as needed (take if BP > 150/90). 60 tablet 3  . ipratropium (ATROVENT) 0.03 % nasal spray USE 2 SPRAYS IN EACH NOSTRIL TWICE DAILY 30 mL 6  . levothyroxine (SYNTHROID) 50 MCG tablet TAKE 1 TABLET(50 MCG) BY MOUTH DAILY BEFORE BREAKFAST 90 tablet 3  . meclizine (ANTIVERT) 12.5 MG tablet Take 1 tablet (12.5 mg total) by mouth 3 (three) times daily as needed for dizziness. 90 tablet 3  . olopatadine (PATANOL) 0.1 % ophthalmic solution Place 1 drop into both eyes daily as needed for allergies. 5 mL 5  . omeprazole (PRILOSEC) 20 MG capsule Take 20 mg by mouth 2 (two) times daily.    . propranolol ER (INDERAL LA) 60 MG 24 hr capsule TAKE 1 CAPSULE(60 MG) BY MOUTH DAILY 90 capsule 3  . Respiratory Therapy Supplies (NEBULIZER) DEVI Use with nebulized albuterol solution as needed for wheezing, cough or  shortness of breath 1 each 0  . senna (SENOKOT) 8.6 MG TABS tablet Take 1 tablet (8.6 mg total) by mouth daily. 120 tablet 3  . sodium chloride 1 g tablet Take 2 tablets (2 g total) by mouth 2 (two) times daily. 120 tablet 6   No facility-administered medications prior to visit.      Review of Systems  Review of Systems   Physical Exam  There were no vitals taken for this visit. Physical Exam   Lab Results:  CBC    Component Value Date/Time   WBC 7.8 09/22/2020 1517   WBC 10.5 01/07/2019 0652   RBC 3.60 (L) 09/22/2020 1517   RBC 3.12 (L) 01/07/2019 0652   HGB 10.2 (L) 09/22/2020 1517   HCT 33.4 (L) 09/22/2020 1517   PLT 235 09/22/2020 1517   MCV 93 09/22/2020 1517   MCH 28.3 09/22/2020 1517   MCH 28.8 01/07/2019 0652   MCHC 30.5 (L) 09/22/2020 1517   MCHC 29.7 (L) 01/07/2019 0652   RDW 12.6 09/22/2020 1517   LYMPHSABS 2.9 09/22/2020 1517   MONOABS 0.8 01/07/2019 0652   EOSABS 0.2 09/22/2020 1517   BASOSABS 0.0 09/22/2020 1517    BMET    Component Value Date/Time   NA 135 09/22/2020 1517   K 4.9 09/22/2020 1517   CL 88 (L) 09/22/2020 1517   CO2 35 (H) 09/22/2020 1517   GLUCOSE 100 (H) 09/22/2020 1517   GLUCOSE 84 05/31/2020 1630   BUN 14 09/22/2020 1517   CREATININE 0.67 09/22/2020 1517   CALCIUM 9.9 09/22/2020 1517   GFRNONAA 75 09/22/2020 1517   GFRAA 87 09/22/2020 1517    BNP    Component Value Date/Time   BNP 175.8 (H) 12/27/2018 1834    ProBNP No results found for: PROBNP  Imaging: No results found.   Assessment & Plan:   No problem-specific Assessment & Plan notes found for this encounter.     Glenford Bayley, NP 01/06/2021

## 2021-01-19 ENCOUNTER — Telehealth (INDEPENDENT_AMBULATORY_CARE_PROVIDER_SITE_OTHER): Payer: Medicare Other | Admitting: Family Medicine

## 2021-01-19 ENCOUNTER — Encounter: Payer: Self-pay | Admitting: Family Medicine

## 2021-01-19 DIAGNOSIS — I10 Essential (primary) hypertension: Secondary | ICD-10-CM

## 2021-01-19 DIAGNOSIS — I5032 Chronic diastolic (congestive) heart failure: Secondary | ICD-10-CM | POA: Diagnosis not present

## 2021-01-19 DIAGNOSIS — J3089 Other allergic rhinitis: Secondary | ICD-10-CM

## 2021-01-19 DIAGNOSIS — R42 Dizziness and giddiness: Secondary | ICD-10-CM | POA: Diagnosis not present

## 2021-01-19 MED ORDER — MECLIZINE HCL 12.5 MG PO TABS
12.5000 mg | ORAL_TABLET | Freq: Three times a day (TID) | ORAL | 3 refills | Status: DC | PRN
Start: 2021-01-19 — End: 2021-12-07

## 2021-01-19 MED ORDER — HYDRALAZINE HCL 10 MG PO TABS: 10.0000 mg | ORAL_TABLET | Freq: Every day | ORAL | 3 refills | Status: DC | PRN

## 2021-01-19 NOTE — Progress Notes (Signed)
Virtual Visit Note  I connected with patient on 01/19/21 at 1343 by Epic video visit and verified that I am speaking with the correct person using two identifiers. Tammy Fernandez is currently located at home and son currently with them during visit. The provider, [facility] Jamese Trauger, FNP is located in their office at time of visit.  I discussed the limitations, risks, security and privacy concerns of performing an evaluation and management service by telephone and the availability of in person appointments. I also discussed with the patient that there may be a patient responsible charge related to this service. The patient expressed understanding and agreed to proceed.   Chief Complaint  Patient presents with  . [facility] PT order    Receiving Personal care assistance and needs order for pt   . Medication Refill    hydralazine and meclizine     HPI ? Overall doing well No acute issues at this time Continues to have issues with weakness and congestion Is requesting physical therapy for assistance with transfers from bed to chair Needed a face to face appointment for this referral She had stopped using her cetrizine and Flonase, encouraged to restart She is concerned about her BP, but unsure what the numbers have been Will contact home health Continues to have issues with cough, but denies SOB    Allergies  Allergen Reactions  . Crestor [Rosuvastatin Calcium] Other (See Comments)    Muscle Aches  . Keflex [Cephalexin] Other (See Comments)    dizziness  . Peanut Butter Flavor Hives  . Sulfa Antibiotics Hives  . Cephalexin Other (See Comments)    dizziness  . Sulfa Drugs Cross Reactors Rash    Prior to Admission medications   Medication Sig Start Date End Date Taking? Authorizing Provider  albuterol (PROVENTIL) (2.5 MG/3ML) 0.083% nebulizer solution USE 3 ML VIA NEBULIZER EVERY 4 HOURS AS NEEDED FOR WHEEZING OR SHORTNESS OF BREATH 11/17/20  Yes Maurizio Geno, [facility] Course, FNP   ALPRAZolam (XANAX) 1 MG tablet Take 1 tablet (1 mg total) by mouth 2 (two) times daily as needed for anxiety. 08/19/20  Yes Lezlie Lye, Irma M, MD  amLODipine (NORVASC) 10 MG tablet TAKE 1 TABLET(10 MG) BY MOUTH DAILY 09/22/20  Yes Saige Canton, [facility] Course, FNP  Aspirin 81 MG EC tablet Take 81 mg by mouth daily.   Yes [provider]  azelastine (ASTELIN) 0.1 % nasal spray Place 1 spray into both nostrils 2 (two) times daily. Use in each nostril as directed 05/31/20  Yes Glenford Bayley, NP  cetirizine (ZYRTEC) 10 MG tablet Take 10 mg by mouth daily.   Yes [provider]  clotrimazole-betamethasone (LOTRISONE) cream Apply 1 application topically 2 (two) times daily. 09/22/20  Yes Adem Costlow, [facility] Course, FNP  demeclocycline (DECLOMYCIN) 300 MG tablet Take 0.5 tablets (150 mg total) by mouth 2 (two) times daily. 11/17/20  Yes Safari Cinque, [facility] Course, FNP  diclofenac sodium (VOLTAREN) 1 % GEL Apply 2 g topically 4 (four) times daily. 06/02/19  Yes Doristine Bosworth, MD  famotidine (PEPCID) 20 MG tablet Take 1 tablet (20 mg total) by mouth 2 (two) times daily. 09/22/20  Yes Chelse Matas, [facility] Course, FNP  fluticasone (FLONASE) 50 MCG/ACT nasal spray SHAKE LIQUID AND USE 1 SPRAY IN EACH NOSTRIL TWICE DAILY 12/16/20  Yes Boston Cookson, [facility] Course, FNP  glycerin adult 2 g suppository Place 1 suppository rectally as needed for constipation. 01/27/19  Yes Lezlie Lye, Meda Coffee, MD  guaiFENesin [facility]) 600 MG 12  hr tablet Take 1 tablet (600 mg total) by mouth 2 (two) times daily as needed for to loosen phlegm. 08/19/20  Yes Lezlie Lye, Meda Coffee, MD  hydrALAZINE (APRESOLINE) 10 MG tablet Take 1 tablet (10 mg total) by mouth daily as needed (take if BP > 150/90). 09/22/20  Yes Jailene Cupit, [facility] Course, FNP  ipratropium (ATROVENT) 0.03 % nasal spray USE 2 SPRAYS IN EACH NOSTRIL TWICE DAILY 04/01/20  Yes Stallings, Zoe A, MD  levothyroxine (SYNTHROID) 50 MCG tablet TAKE 1 TABLET(50 MCG) BY MOUTH DAILY BEFORE BREAKFAST 09/22/20  Yes Zamire Whitehurst, [facility] Course, FNP   meclizine (ANTIVERT) 12.5 MG tablet Take 1 tablet (12.5 mg total) by mouth 3 (three) times daily as needed for dizziness. 01/12/20  Yes Lezlie Lye, Irma M, MD  olopatadine (PATANOL) 0.1 % ophthalmic solution Place 1 drop into both eyes daily as needed for allergies. 09/22/20  Yes Brittiny Levitz, [facility] Course, FNP  omeprazole (PRILOSEC) 20 MG capsule Take 20 mg by mouth 2 (two) times daily.   Yes [provider]  propranolol ER (INDERAL LA) 60 MG 24 hr capsule TAKE 1 CAPSULE(60 MG) BY MOUTH DAILY 09/22/20  Yes Lindsee Labarre, [facility] Course, FNP  Respiratory Therapy Supplies (NEBULIZER) DEVI Use with nebulized albuterol solution as needed for wheezing, cough or shortness of breath 08/17/20  Yes Lezlie Lye, Meda Coffee, MD  senna (SENOKOT) 8.6 MG TABS tablet Take 1 tablet (8.6 mg total) by mouth daily. 09/22/20  Yes Algie Westry, [facility]e Course, FNP  sodium chloride 1 g tablet Take 2 tablets (2 g total) by mouth 2 (two) times daily. 09/22/20 04/20/21 Yes Thamas Appleyard, [facility] Course, FNP    Past Medical History:  Diagnosis Date  . Anxiety   . Chronic lower back pain   . Constipation    h/o fecal disimpaction on 2017  . CVA (cerebral vascular accident) (HCC) 2015   neg workup, MRI small acute lacunar infarcts. ASA only  . Depression   . Diastolic CHF, chronic (HCC)    grade I, most recent echo Jan 2019  . Fatty liver   . GERD (gastroesophageal reflux disease)   . Hyperlipidemia   . Hypertension   . Hyponatremia    multiple episodes with encephalopathy, renal thinks SIADH  . Hypothyroidism, unspecified   . Internal hemorrhoid   . NSTEMI (non-ST elevated myocardial infarction) (HCC) 08/28/2016  . Palpitations   . PFO with atrial septal aneurysm    TTE 2015  . Pneumonia 2016  . Severe pulmonary arterial systolic hypertension (HCC) 12/25/2017   per echo, on 1L oxgen via [place]    Past Surgical History:  Procedure Laterality Date  . ABDOMINAL HYSTERECTOMY    . CATARACT EXTRACTION W/ INTRAOCULAR LENS  IMPLANT, BILATERAL Bilateral   .  CESAREAN SECTION    . TEE WITHOUT CARDIOVERSION N/A 01/19/2014   Procedure: TRANSESOPHAGEAL ECHOCARDIOGRAM (TEE);  Surgeon: Laurey Morale, MD;  Location: [facility] ENDOSCOPY;  Service: Cardiovascular;  Laterality: N/A;  [name]  . VAGINAL HYSTERECTOMY      Social History   Tobacco Use  . Smoking status: Never Smoker  . Smokeless tobacco: Former Neurosurgeon    Types: Snuff  . Tobacco comment: "used snuff when I was 19"  Substance Use Topics  . Alcohol use: No    Family History  Problem Relation Age of Onset  . Stroke Father   . Hypertension Father   . Colon cancer Cousin   . Colon cancer Other        Aunt  . Diabetes Other  aunt and cousins  . Heart disease Other        cousins  . Healthy Daughter   . Healthy Son     Review of Systems  Constitutional: Negative for chills, fever and malaise/fatigue.  HENT: Positive for congestion. Negative for sinus pain and sore throat.   Eyes: Negative for blurred vision and double vision.  Respiratory: Positive for cough and sputum production. Negative for shortness of breath and wheezing.   Cardiovascular: Negative for chest pain, palpitations and leg swelling.  Gastrointestinal: Positive for constipation. Negative for abdominal pain, diarrhea, heartburn, nausea and vomiting.  Genitourinary: Negative for dysuria, frequency and hematuria.  Musculoskeletal: Negative for back pain and joint pain.  Skin: Negative for rash.  Neurological: Positive for dizziness and weakness. Negative for headaches.    Objective  Constitutional:      General: Not in acute distress.    Appearance: Normal appearance. Not ill-appearing.   Pulmonary:     Effort: Pulmonary effort is normal. No respiratory distress.  Neurological:     Mental Status: Alert and oriented to person, place, and time.  Psychiatric:        Mood and Affect: Mood normal.        Behavior: Behavior normal.     ASSESSMENT and PLAN  Problem List Items Addressed This Visit       Cardiovascular and Mediastinum   Essential hypertension   Relevant Medications   hydrALAZINE (APRESOLINE) 10 MG tablet   Chronic diastolic CHF (congestive heart failure) (HCC) - Primary   Relevant Medications   hydrALAZINE (APRESOLINE) 10 MG tablet   Other Relevant Orders   Ambulatory referral to Home Health    Other Visit Diagnoses    Dizziness       Relevant Medications   meclizine (ANTIVERT) 12.5 MG tablet   Non-seasonal allergic rhinitis, unspecified trigger          Plan . PT referral sent . Medication refills ordered . RTC/ED precautions provided . Will continue to monitor chronic conditions.  Return in about 3 months (around 04/18/2021).    The above assessment and management plan was discussed with the patient. The patient verbalized understanding of and has agreed to the management plan. Patient is aware to call the clinic if symptoms persist or worsen. Patient is aware when to return to the clinic for a follow-up visit. Patient educated on when it is appropriate to go to the emergency department.     Macario Carls Josephine Wooldridge, FNP-BC Primary Care at [facility] [address] [place], [place] [address] Ph.  [phone] Fax [phone]

## 2021-01-19 NOTE — Patient Instructions (Addendum)
  Health Maintenance After Age 85 After age 85, you are at a higher risk for certain long-term diseases and infections as well as injuries from falls. Falls are a major cause of broken bones and head injuries in people who are older than age 85. Getting regular preventive care can help to keep you healthy and well. Preventive care includes getting regular testing and making lifestyle changes as recommended by your health care provider. Talk with your health care provider about:  Which screenings and tests you should have. A screening is a test that checks for a disease when you have no symptoms.  A diet and exercise plan that is right for you. What should I know about screenings and tests to prevent falls? Screening and testing are the best ways to find a health problem early. Early diagnosis and treatment give you the best chance of managing medical conditions that are common after age 85. Certain conditions and lifestyle choices may make you more likely to have a fall. Your health care provider may recommend:  Regular vision checks. Poor vision and conditions such as cataracts can make you more likely to have a fall. If you wear glasses, make sure to get your prescription updated if your vision changes.  Medicine review. Work with your health care provider to regularly review all of the medicines you are taking, including over-the-counter medicines. Ask your health care provider about any side effects that may make you more likely to have a fall. Tell your health care provider if any medicines that you take make you feel dizzy or sleepy.  Osteoporosis screening. Osteoporosis is a condition that causes the bones to get weaker. This can make the bones weak and cause them to break more easily.  Blood pressure screening. Blood pressure changes and medicines to control blood pressure can make you feel dizzy.  Strength and balance checks. Your health care provider may recommend certain tests to  check your strength and balance while standing, walking, or changing positions.  Foot health exam. Foot pain and numbness, as well as not wearing proper footwear, can make you more likely to have a fall.  Depression screening. You may be more likely to have a fall if you have a fear of falling, feel emotionally low, or feel unable to do activities that you used to do.  Alcohol use screening. Using too much alcohol can affect your balance and may make you more likely to have a fall. What actions can I take to lower my risk of falls? General instructions  Talk with your health care provider about your risks for falling. Tell your health care provider if: ? You fall. Be sure to tell your health care provider about all falls, even ones that seem minor. ? You feel dizzy, sleepy, or off-balance.  Take over-the-counter and prescription medicines only as told by your health care provider. These include any supplements.  Eat a healthy diet and maintain a healthy weight. A healthy diet includes low-fat dairy products, low-fat (lean) meats, and fiber from whole grains, beans, and lots of fruits and vegetables. Home safety  Remove any tripping hazards, such as rugs, cords, and clutter.  Install safety equipment such as grab bars in bathrooms and safety rails on stairs.  Keep rooms and walkways well-lit. Activity  Follow a regular exercise program to stay fit. This will help you maintain your balance. Ask your health care provider what types of exercise are appropriate for you.  If you need a cane   or walker, use it as recommended by your health care provider.  Wear supportive shoes that have nonskid soles.   Lifestyle  Do not drink alcohol if your health care provider tells you not to drink.  If you drink alcohol, limit how much you have: ? 0-1 drink a day for women. ? 0-2 drinks a day for men.  Be aware of how much alcohol is in your drink. In the [place] one drink equals one typical bottle  of beer (12 oz), one-half glass of wine (5 oz), or one shot of hard liquor (1 oz).  Do not use any products that contain nicotine or tobacco, such as cigarettes and e-cigarettes. If you need help quitting, ask your health care provider. Summary  Having a healthy lifestyle and getting preventive care can help to protect your health and wellness after age 85.  Screening and testing are the best way to find a health problem early and help you avoid having a fall. Early diagnosis and treatment give you the best chance for managing medical conditions that are more common for people who are older than age 85.  Falls are a major cause of broken bones and head injuries in people who are older than age 85. Take precautions to prevent a fall at home.  Work with your health care provider to learn what changes you can make to improve your health and wellness and to prevent falls. This information is not intended to replace advice given to you by your health care provider. Make sure you discuss any questions you have with your health care provider. Document Revised: 03/12/2019 Document Reviewed: 10/02/2017 Elsevier Patient Education  2021 Elsevier Inc.   If you have lab work done today you will be contacted with your lab results within the next 2 weeks.  If you have not heard from us then please contact us. The fastest way to get your results is to register for My Chart.   IF you received an x-ray today, you will receive an invoice from [facility]. Please contact [facility] at [phone] with questions or concerns regarding your invoice.   IF you received labwork today, you will receive an invoice from [facility]. Please contact [facility] at [phone] with questions or concerns regarding your invoice.   Our billing staff will not be able to assist you with questions regarding bills from these companies.  You will be contacted with the lab results as soon as they are available.  The fastest way to get your results is to activate your My Chart account. Instructions are located on the last page of this paperwork. If you have not heard from us regarding the results in 2 weeks, please contact this office.      

## 2021-01-20 ENCOUNTER — Other Ambulatory Visit: Payer: Self-pay | Admitting: Family Medicine

## 2021-01-20 DIAGNOSIS — I10 Essential (primary) hypertension: Secondary | ICD-10-CM

## 2021-01-23 DIAGNOSIS — Z0000 Encounter for general adult medical examination without abnormal findings: Secondary | ICD-10-CM | POA: Diagnosis not present

## 2021-01-23 DIAGNOSIS — I10 Essential (primary) hypertension: Secondary | ICD-10-CM | POA: Diagnosis not present

## 2021-01-23 DIAGNOSIS — J9611 Chronic respiratory failure with hypoxia: Secondary | ICD-10-CM | POA: Diagnosis not present

## 2021-01-23 DIAGNOSIS — E871 Hypo-osmolality and hyponatremia: Secondary | ICD-10-CM | POA: Diagnosis not present

## 2021-01-26 ENCOUNTER — Ambulatory Visit: Payer: Medicare Other | Admitting: *Deleted

## 2021-01-30 DIAGNOSIS — R7881 Bacteremia: Secondary | ICD-10-CM | POA: Diagnosis not present

## 2021-01-30 DIAGNOSIS — B9562 Methicillin resistant Staphylococcus aureus infection as the cause of diseases classified elsewhere: Secondary | ICD-10-CM | POA: Diagnosis not present

## 2021-01-30 DIAGNOSIS — J9601 Acute respiratory failure with hypoxia: Secondary | ICD-10-CM | POA: Diagnosis not present

## 2021-01-30 DIAGNOSIS — I6789 Other cerebrovascular disease: Secondary | ICD-10-CM | POA: Diagnosis not present

## 2021-02-02 ENCOUNTER — Other Ambulatory Visit: Payer: Self-pay | Admitting: *Deleted

## 2021-02-02 NOTE — Patient Outreach (Addendum)
[facility]) Care Management  02/02/2021  Tammy Fernandez 09-27-1924 [id number]  [phone] Voce mail message received from patient son Tammy Fernandez. He is asking about a Dr that would come to home.  Gean Maidens BSN RN [facility] Care Management [phone]    01:39 RN Health Coach returned telephone call to patient son.  RN left voicemail message that the Dr he had mentioned. He needs to call and check and see they are covered under her plan or is he out of network.   Gean Maidens BSN RN [facility] Care Management [phone]

## 2021-02-07 ENCOUNTER — Other Ambulatory Visit: Payer: Self-pay | Admitting: Family Medicine

## 2021-02-07 NOTE — Telephone Encounter (Signed)
Requested medication (s) are due for refill today:yes  Requested medication (s) are on the active medication list:yes  Last refill:  08/19/20  #60  2 refills  Future visit scheduled yes 04/19/21  Notes to clinic: not delegated  Requested Prescriptions  Pending Prescriptions Disp Refills   ALPRAZolam (XANAX) 1 MG tablet [Pharmacy Med Name: ALPRAZOLAM 1MG  TABLETS] 60 tablet     Sig: TAKE 1 TABLET(1 MG) BY MOUTH TWICE DAILY AS NEEDED FOR ANXIETY      Not Delegated - Psychiatry:  Anxiolytics/Hypnotics Failed - 02/07/2021  6:42 PM      Failed - This refill cannot be delegated      Failed - Urine Drug Screen completed in last 360 days      Passed - Valid encounter within last 6 months    Recent Outpatient Visits           2 weeks ago Chronic diastolic CHF (congestive heart failure) (HCC)   Primary Care at 04/09/2021 Just, [place], FNP   3 months ago Gastroesophageal reflux disease without esophagitis   Primary Care at [facility] Just, [place], FNP   4 months ago Essential hypertension   Primary Care at [facility], [facility], FNP   5 months ago Essential hypertension   Primary Care at [facility], MD   7 months ago Essential hypertension   Primary Care at [facility], [facility], MD       Future Appointments             In 2 months Just, Meda Coffee, FNP Primary Care at [place]

## 2021-02-08 ENCOUNTER — Other Ambulatory Visit: Payer: Self-pay | Admitting: Family Medicine

## 2021-02-08 ENCOUNTER — Telehealth: Payer: Self-pay | Admitting: Pulmonary Disease

## 2021-02-08 ENCOUNTER — Telehealth: Payer: Self-pay

## 2021-02-08 ENCOUNTER — Other Ambulatory Visit: Payer: Self-pay

## 2021-02-08 DIAGNOSIS — R059 Cough, unspecified: Secondary | ICD-10-CM

## 2021-02-08 DIAGNOSIS — I5032 Chronic diastolic (congestive) heart failure: Secondary | ICD-10-CM

## 2021-02-08 MED ORDER — OLOPATADINE HCL 0.1 % OP SOLN: 1.0000 [drp] | Freq: Every day | OPHTHALMIC | 5 refills | Status: AC | PRN

## 2021-02-08 NOTE — Telephone Encounter (Signed)
Spoke with [name] (per [facility]) and he stated someone from [employer] called him and stated that [employer] needed office documentation of continued need of pt for [facility] use. Called [employer] to ask what documentation was needed but had to leave a voice mail for someone to return my call. Will leave encounter open for now

## 2021-02-08 NOTE — Telephone Encounter (Signed)
Spoke with [name], pt son on hippa regarding refills, he says they have not heard from [facility]  About home  PT orders . Called [facility] at [phone] LVM .

## 2021-02-10 ENCOUNTER — Telehealth: Payer: Self-pay | Admitting: Family Medicine

## 2021-02-10 NOTE — Telephone Encounter (Signed)
Tammy Fernandez, from [facility], calling and is following up on [facility] orders. She states that they have not received anything from office and is requesting to have orders sent to another fax number. Please advise.      Fax# [phone]   Callback# [phone]

## 2021-02-10 NOTE — Telephone Encounter (Signed)
Resent to requested Fax number. Awaiting successful fax notification

## 2021-02-10 NOTE — Telephone Encounter (Signed)
confirmation recieved

## 2021-02-11 DIAGNOSIS — J9611 Chronic respiratory failure with hypoxia: Secondary | ICD-10-CM | POA: Diagnosis not present

## 2021-02-11 DIAGNOSIS — I10 Essential (primary) hypertension: Secondary | ICD-10-CM | POA: Diagnosis not present

## 2021-02-13 DIAGNOSIS — K5900 Constipation, unspecified: Secondary | ICD-10-CM | POA: Diagnosis not present

## 2021-02-13 DIAGNOSIS — I69354 Hemiplegia and hemiparesis following cerebral infarction affecting left non-dominant side: Secondary | ICD-10-CM | POA: Diagnosis not present

## 2021-02-13 DIAGNOSIS — Z9981 Dependence on supplemental oxygen: Secondary | ICD-10-CM | POA: Diagnosis not present

## 2021-02-13 DIAGNOSIS — E039 Hypothyroidism, unspecified: Secondary | ICD-10-CM | POA: Diagnosis not present

## 2021-02-13 DIAGNOSIS — K219 Gastro-esophageal reflux disease without esophagitis: Secondary | ICD-10-CM | POA: Diagnosis not present

## 2021-02-13 DIAGNOSIS — I69391 Dysphagia following cerebral infarction: Secondary | ICD-10-CM | POA: Diagnosis not present

## 2021-02-13 DIAGNOSIS — I2721 Secondary pulmonary arterial hypertension: Secondary | ICD-10-CM | POA: Diagnosis not present

## 2021-02-13 DIAGNOSIS — G4733 Obstructive sleep apnea (adult) (pediatric): Secondary | ICD-10-CM | POA: Diagnosis not present

## 2021-02-13 DIAGNOSIS — F32A Depression, unspecified: Secondary | ICD-10-CM | POA: Diagnosis not present

## 2021-02-13 DIAGNOSIS — D72829 Elevated white blood cell count, unspecified: Secondary | ICD-10-CM | POA: Diagnosis not present

## 2021-02-13 DIAGNOSIS — J9611 Chronic respiratory failure with hypoxia: Secondary | ICD-10-CM | POA: Diagnosis not present

## 2021-02-13 DIAGNOSIS — Z9989 Dependence on other enabling machines and devices: Secondary | ICD-10-CM | POA: Diagnosis not present

## 2021-02-13 DIAGNOSIS — I110 Hypertensive heart disease with heart failure: Secondary | ICD-10-CM | POA: Diagnosis not present

## 2021-02-13 DIAGNOSIS — M545 Low back pain, unspecified: Secondary | ICD-10-CM | POA: Diagnosis not present

## 2021-02-13 DIAGNOSIS — I252 Old myocardial infarction: Secondary | ICD-10-CM | POA: Diagnosis not present

## 2021-02-13 DIAGNOSIS — E785 Hyperlipidemia, unspecified: Secondary | ICD-10-CM | POA: Diagnosis not present

## 2021-02-13 DIAGNOSIS — G8929 Other chronic pain: Secondary | ICD-10-CM | POA: Diagnosis not present

## 2021-02-13 DIAGNOSIS — Z7982 Long term (current) use of aspirin: Secondary | ICD-10-CM | POA: Diagnosis not present

## 2021-02-13 DIAGNOSIS — G20 Parkinson's disease: Secondary | ICD-10-CM | POA: Diagnosis not present

## 2021-02-13 DIAGNOSIS — I5032 Chronic diastolic (congestive) heart failure: Secondary | ICD-10-CM | POA: Diagnosis not present

## 2021-02-14 NOTE — Telephone Encounter (Signed)
Community message sent to [employer]. 

## 2021-02-15 DIAGNOSIS — J441 Chronic obstructive pulmonary disease with (acute) exacerbation: Secondary | ICD-10-CM | POA: Diagnosis not present

## 2021-02-15 DIAGNOSIS — I6789 Other cerebrovascular disease: Secondary | ICD-10-CM | POA: Diagnosis not present

## 2021-02-15 DIAGNOSIS — J9601 Acute respiratory failure with hypoxia: Secondary | ICD-10-CM | POA: Diagnosis not present

## 2021-02-15 DIAGNOSIS — R7881 Bacteremia: Secondary | ICD-10-CM | POA: Diagnosis not present

## 2021-02-15 NOTE — Telephone Encounter (Signed)
Received this community msg:  New, Kaleen Odea, Angelita Ingles, RN; Chase Picket, Melissa Hello,   I have sent a message to the vent to for review. I will respond once they send me a message back.   Thank you,   Nida Boatman New    Will await another msg per [facility].

## 2021-02-20 DIAGNOSIS — G20 Parkinson's disease: Secondary | ICD-10-CM | POA: Diagnosis not present

## 2021-02-20 DIAGNOSIS — J9611 Chronic respiratory failure with hypoxia: Secondary | ICD-10-CM | POA: Diagnosis not present

## 2021-02-20 DIAGNOSIS — I5032 Chronic diastolic (congestive) heart failure: Secondary | ICD-10-CM | POA: Diagnosis not present

## 2021-02-20 DIAGNOSIS — K5900 Constipation, unspecified: Secondary | ICD-10-CM | POA: Diagnosis not present

## 2021-02-20 DIAGNOSIS — I69391 Dysphagia following cerebral infarction: Secondary | ICD-10-CM | POA: Diagnosis not present

## 2021-02-20 DIAGNOSIS — Z7982 Long term (current) use of aspirin: Secondary | ICD-10-CM | POA: Diagnosis not present

## 2021-02-20 DIAGNOSIS — I110 Hypertensive heart disease with heart failure: Secondary | ICD-10-CM | POA: Diagnosis not present

## 2021-02-20 DIAGNOSIS — I2721 Secondary pulmonary arterial hypertension: Secondary | ICD-10-CM | POA: Diagnosis not present

## 2021-02-20 DIAGNOSIS — F32A Depression, unspecified: Secondary | ICD-10-CM | POA: Diagnosis not present

## 2021-02-20 DIAGNOSIS — K219 Gastro-esophageal reflux disease without esophagitis: Secondary | ICD-10-CM | POA: Diagnosis not present

## 2021-02-20 DIAGNOSIS — E785 Hyperlipidemia, unspecified: Secondary | ICD-10-CM | POA: Diagnosis not present

## 2021-02-20 DIAGNOSIS — M545 Low back pain, unspecified: Secondary | ICD-10-CM | POA: Diagnosis not present

## 2021-02-20 DIAGNOSIS — Z9981 Dependence on supplemental oxygen: Secondary | ICD-10-CM | POA: Diagnosis not present

## 2021-02-20 DIAGNOSIS — D72829 Elevated white blood cell count, unspecified: Secondary | ICD-10-CM | POA: Diagnosis not present

## 2021-02-20 DIAGNOSIS — G4733 Obstructive sleep apnea (adult) (pediatric): Secondary | ICD-10-CM | POA: Diagnosis not present

## 2021-02-20 DIAGNOSIS — G8929 Other chronic pain: Secondary | ICD-10-CM | POA: Diagnosis not present

## 2021-02-20 DIAGNOSIS — E039 Hypothyroidism, unspecified: Secondary | ICD-10-CM | POA: Diagnosis not present

## 2021-02-20 DIAGNOSIS — I69354 Hemiplegia and hemiparesis following cerebral infarction affecting left non-dominant side: Secondary | ICD-10-CM | POA: Diagnosis not present

## 2021-02-20 DIAGNOSIS — I252 Old myocardial infarction: Secondary | ICD-10-CM | POA: Diagnosis not present

## 2021-02-20 DIAGNOSIS — Z9989 Dependence on other enabling machines and devices: Secondary | ICD-10-CM | POA: Diagnosis not present

## 2021-02-21 DIAGNOSIS — I10 Essential (primary) hypertension: Secondary | ICD-10-CM | POA: Diagnosis not present

## 2021-02-21 DIAGNOSIS — J9611 Chronic respiratory failure with hypoxia: Secondary | ICD-10-CM | POA: Diagnosis not present

## 2021-02-21 DIAGNOSIS — I272 Pulmonary hypertension, unspecified: Secondary | ICD-10-CM | POA: Diagnosis not present

## 2021-02-21 DIAGNOSIS — E871 Hypo-osmolality and hyponatremia: Secondary | ICD-10-CM | POA: Diagnosis not present

## 2021-02-21 DIAGNOSIS — H9190 Unspecified hearing loss, unspecified ear: Secondary | ICD-10-CM | POA: Diagnosis not present

## 2021-02-24 ENCOUNTER — Other Ambulatory Visit: Payer: Self-pay | Admitting: Family Medicine

## 2021-02-27 DIAGNOSIS — I69391 Dysphagia following cerebral infarction: Secondary | ICD-10-CM | POA: Diagnosis not present

## 2021-02-27 DIAGNOSIS — E785 Hyperlipidemia, unspecified: Secondary | ICD-10-CM | POA: Diagnosis not present

## 2021-02-27 DIAGNOSIS — D72829 Elevated white blood cell count, unspecified: Secondary | ICD-10-CM | POA: Diagnosis not present

## 2021-02-27 DIAGNOSIS — I5032 Chronic diastolic (congestive) heart failure: Secondary | ICD-10-CM | POA: Diagnosis not present

## 2021-02-27 DIAGNOSIS — Z9989 Dependence on other enabling machines and devices: Secondary | ICD-10-CM | POA: Diagnosis not present

## 2021-02-27 DIAGNOSIS — G8929 Other chronic pain: Secondary | ICD-10-CM | POA: Diagnosis not present

## 2021-02-27 DIAGNOSIS — M545 Low back pain, unspecified: Secondary | ICD-10-CM | POA: Diagnosis not present

## 2021-02-27 DIAGNOSIS — I69354 Hemiplegia and hemiparesis following cerebral infarction affecting left non-dominant side: Secondary | ICD-10-CM | POA: Diagnosis not present

## 2021-02-27 DIAGNOSIS — I252 Old myocardial infarction: Secondary | ICD-10-CM | POA: Diagnosis not present

## 2021-02-27 DIAGNOSIS — I110 Hypertensive heart disease with heart failure: Secondary | ICD-10-CM | POA: Diagnosis not present

## 2021-02-27 DIAGNOSIS — G20 Parkinson's disease: Secondary | ICD-10-CM | POA: Diagnosis not present

## 2021-02-27 DIAGNOSIS — Z9981 Dependence on supplemental oxygen: Secondary | ICD-10-CM | POA: Diagnosis not present

## 2021-02-27 DIAGNOSIS — E039 Hypothyroidism, unspecified: Secondary | ICD-10-CM | POA: Diagnosis not present

## 2021-02-27 DIAGNOSIS — K219 Gastro-esophageal reflux disease without esophagitis: Secondary | ICD-10-CM | POA: Diagnosis not present

## 2021-02-27 DIAGNOSIS — G4733 Obstructive sleep apnea (adult) (pediatric): Secondary | ICD-10-CM | POA: Diagnosis not present

## 2021-02-27 DIAGNOSIS — F32A Depression, unspecified: Secondary | ICD-10-CM | POA: Diagnosis not present

## 2021-02-27 DIAGNOSIS — I2721 Secondary pulmonary arterial hypertension: Secondary | ICD-10-CM | POA: Diagnosis not present

## 2021-02-27 DIAGNOSIS — K5900 Constipation, unspecified: Secondary | ICD-10-CM | POA: Diagnosis not present

## 2021-02-27 DIAGNOSIS — Z7982 Long term (current) use of aspirin: Secondary | ICD-10-CM | POA: Diagnosis not present

## 2021-02-27 DIAGNOSIS — J9611 Chronic respiratory failure with hypoxia: Secondary | ICD-10-CM | POA: Diagnosis not present

## 2021-02-28 DIAGNOSIS — R7881 Bacteremia: Secondary | ICD-10-CM | POA: Diagnosis not present

## 2021-02-28 DIAGNOSIS — B9562 Methicillin resistant Staphylococcus aureus infection as the cause of diseases classified elsewhere: Secondary | ICD-10-CM | POA: Diagnosis not present

## 2021-02-28 DIAGNOSIS — J9601 Acute respiratory failure with hypoxia: Secondary | ICD-10-CM | POA: Diagnosis not present

## 2021-02-28 DIAGNOSIS — I6789 Other cerebrovascular disease: Secondary | ICD-10-CM | POA: Diagnosis not present

## 2021-03-01 ENCOUNTER — Other Ambulatory Visit: Payer: Self-pay | Admitting: *Deleted

## 2021-03-01 NOTE — Telephone Encounter (Signed)
Still nothing so sent another msg to [place]. Will await response.

## 2021-03-01 NOTE — Patient Outreach (Signed)
[facility] Customer service manager [facility]) Care Management  03/01/2021  Tammy Fernandez October 30, 1924 [id number]   RN case closure. Patient has enrolled in external program.   Gean Maidens BSN RN [facility] Care Management [phone]

## 2021-03-02 NOTE — Telephone Encounter (Signed)
I have called and LM on VM for the pts son, [name] to call back.

## 2021-03-02 NOTE — Telephone Encounter (Signed)
Vinetta Bergamo, CMA; Stenson, Melissa Hello,   Per our vent team we have received what is needed a t this time.   Thank you,   Luellen Pucker

## 2021-03-08 DIAGNOSIS — I252 Old myocardial infarction: Secondary | ICD-10-CM | POA: Diagnosis not present

## 2021-03-08 DIAGNOSIS — I69391 Dysphagia following cerebral infarction: Secondary | ICD-10-CM | POA: Diagnosis not present

## 2021-03-08 DIAGNOSIS — K5900 Constipation, unspecified: Secondary | ICD-10-CM | POA: Diagnosis not present

## 2021-03-08 DIAGNOSIS — I110 Hypertensive heart disease with heart failure: Secondary | ICD-10-CM | POA: Diagnosis not present

## 2021-03-08 DIAGNOSIS — E785 Hyperlipidemia, unspecified: Secondary | ICD-10-CM | POA: Diagnosis not present

## 2021-03-08 DIAGNOSIS — Z9981 Dependence on supplemental oxygen: Secondary | ICD-10-CM | POA: Diagnosis not present

## 2021-03-08 DIAGNOSIS — I5032 Chronic diastolic (congestive) heart failure: Secondary | ICD-10-CM | POA: Diagnosis not present

## 2021-03-08 DIAGNOSIS — I2721 Secondary pulmonary arterial hypertension: Secondary | ICD-10-CM | POA: Diagnosis not present

## 2021-03-08 DIAGNOSIS — F32A Depression, unspecified: Secondary | ICD-10-CM | POA: Diagnosis not present

## 2021-03-08 DIAGNOSIS — G4733 Obstructive sleep apnea (adult) (pediatric): Secondary | ICD-10-CM | POA: Diagnosis not present

## 2021-03-08 DIAGNOSIS — M545 Low back pain, unspecified: Secondary | ICD-10-CM | POA: Diagnosis not present

## 2021-03-08 DIAGNOSIS — Z7982 Long term (current) use of aspirin: Secondary | ICD-10-CM | POA: Diagnosis not present

## 2021-03-08 DIAGNOSIS — Z9989 Dependence on other enabling machines and devices: Secondary | ICD-10-CM | POA: Diagnosis not present

## 2021-03-08 DIAGNOSIS — G8929 Other chronic pain: Secondary | ICD-10-CM | POA: Diagnosis not present

## 2021-03-08 DIAGNOSIS — J9611 Chronic respiratory failure with hypoxia: Secondary | ICD-10-CM | POA: Diagnosis not present

## 2021-03-08 DIAGNOSIS — D72829 Elevated white blood cell count, unspecified: Secondary | ICD-10-CM | POA: Diagnosis not present

## 2021-03-08 DIAGNOSIS — G20 Parkinson's disease: Secondary | ICD-10-CM | POA: Diagnosis not present

## 2021-03-08 DIAGNOSIS — I69354 Hemiplegia and hemiparesis following cerebral infarction affecting left non-dominant side: Secondary | ICD-10-CM | POA: Diagnosis not present

## 2021-03-08 DIAGNOSIS — E039 Hypothyroidism, unspecified: Secondary | ICD-10-CM | POA: Diagnosis not present

## 2021-03-08 DIAGNOSIS — K219 Gastro-esophageal reflux disease without esophagitis: Secondary | ICD-10-CM | POA: Diagnosis not present

## 2021-03-09 ENCOUNTER — Other Ambulatory Visit: Payer: Self-pay | Admitting: Family Medicine

## 2021-03-09 DIAGNOSIS — E039 Hypothyroidism, unspecified: Secondary | ICD-10-CM | POA: Diagnosis not present

## 2021-03-09 DIAGNOSIS — Z9989 Dependence on other enabling machines and devices: Secondary | ICD-10-CM | POA: Diagnosis not present

## 2021-03-09 DIAGNOSIS — G8929 Other chronic pain: Secondary | ICD-10-CM | POA: Diagnosis not present

## 2021-03-09 DIAGNOSIS — G4733 Obstructive sleep apnea (adult) (pediatric): Secondary | ICD-10-CM | POA: Diagnosis not present

## 2021-03-09 DIAGNOSIS — I5032 Chronic diastolic (congestive) heart failure: Secondary | ICD-10-CM | POA: Diagnosis not present

## 2021-03-09 DIAGNOSIS — D72829 Elevated white blood cell count, unspecified: Secondary | ICD-10-CM | POA: Diagnosis not present

## 2021-03-09 DIAGNOSIS — E785 Hyperlipidemia, unspecified: Secondary | ICD-10-CM | POA: Diagnosis not present

## 2021-03-09 DIAGNOSIS — I69391 Dysphagia following cerebral infarction: Secondary | ICD-10-CM | POA: Diagnosis not present

## 2021-03-09 DIAGNOSIS — I69354 Hemiplegia and hemiparesis following cerebral infarction affecting left non-dominant side: Secondary | ICD-10-CM | POA: Diagnosis not present

## 2021-03-09 DIAGNOSIS — Z7982 Long term (current) use of aspirin: Secondary | ICD-10-CM | POA: Diagnosis not present

## 2021-03-09 DIAGNOSIS — I2721 Secondary pulmonary arterial hypertension: Secondary | ICD-10-CM | POA: Diagnosis not present

## 2021-03-09 DIAGNOSIS — I252 Old myocardial infarction: Secondary | ICD-10-CM | POA: Diagnosis not present

## 2021-03-09 DIAGNOSIS — I110 Hypertensive heart disease with heart failure: Secondary | ICD-10-CM | POA: Diagnosis not present

## 2021-03-09 DIAGNOSIS — G20 Parkinson's disease: Secondary | ICD-10-CM | POA: Diagnosis not present

## 2021-03-09 DIAGNOSIS — K219 Gastro-esophageal reflux disease without esophagitis: Secondary | ICD-10-CM | POA: Diagnosis not present

## 2021-03-09 DIAGNOSIS — F32A Depression, unspecified: Secondary | ICD-10-CM | POA: Diagnosis not present

## 2021-03-09 DIAGNOSIS — K5900 Constipation, unspecified: Secondary | ICD-10-CM | POA: Diagnosis not present

## 2021-03-09 DIAGNOSIS — Z9981 Dependence on supplemental oxygen: Secondary | ICD-10-CM | POA: Diagnosis not present

## 2021-03-09 DIAGNOSIS — M545 Low back pain, unspecified: Secondary | ICD-10-CM | POA: Diagnosis not present

## 2021-03-09 DIAGNOSIS — J9611 Chronic respiratory failure with hypoxia: Secondary | ICD-10-CM | POA: Diagnosis not present

## 2021-03-09 NOTE — Telephone Encounter (Signed)
Called and spoke to Tammy Fernandez. Informed him of the information from [employer]. Tammy Fernandez verbalized understanding. Tammy Fernandez to call back if hasnt heard from [employer] by middle/end of next week. Tammy Fernandez verbalized understanding and denied any further questions or concerns at this time.

## 2021-03-14 DIAGNOSIS — J9611 Chronic respiratory failure with hypoxia: Secondary | ICD-10-CM | POA: Diagnosis not present

## 2021-03-14 DIAGNOSIS — Z9981 Dependence on supplemental oxygen: Secondary | ICD-10-CM | POA: Diagnosis not present

## 2021-03-14 DIAGNOSIS — Z9989 Dependence on other enabling machines and devices: Secondary | ICD-10-CM | POA: Diagnosis not present

## 2021-03-14 DIAGNOSIS — I252 Old myocardial infarction: Secondary | ICD-10-CM | POA: Diagnosis not present

## 2021-03-14 DIAGNOSIS — M545 Low back pain, unspecified: Secondary | ICD-10-CM | POA: Diagnosis not present

## 2021-03-14 DIAGNOSIS — I2721 Secondary pulmonary arterial hypertension: Secondary | ICD-10-CM | POA: Diagnosis not present

## 2021-03-14 DIAGNOSIS — I5032 Chronic diastolic (congestive) heart failure: Secondary | ICD-10-CM | POA: Diagnosis not present

## 2021-03-14 DIAGNOSIS — I69391 Dysphagia following cerebral infarction: Secondary | ICD-10-CM | POA: Diagnosis not present

## 2021-03-14 DIAGNOSIS — G8929 Other chronic pain: Secondary | ICD-10-CM | POA: Diagnosis not present

## 2021-03-14 DIAGNOSIS — G20 Parkinson's disease: Secondary | ICD-10-CM | POA: Diagnosis not present

## 2021-03-14 DIAGNOSIS — I69354 Hemiplegia and hemiparesis following cerebral infarction affecting left non-dominant side: Secondary | ICD-10-CM | POA: Diagnosis not present

## 2021-03-14 DIAGNOSIS — D72829 Elevated white blood cell count, unspecified: Secondary | ICD-10-CM | POA: Diagnosis not present

## 2021-03-14 DIAGNOSIS — F32A Depression, unspecified: Secondary | ICD-10-CM | POA: Diagnosis not present

## 2021-03-14 DIAGNOSIS — E039 Hypothyroidism, unspecified: Secondary | ICD-10-CM | POA: Diagnosis not present

## 2021-03-14 DIAGNOSIS — G4733 Obstructive sleep apnea (adult) (pediatric): Secondary | ICD-10-CM | POA: Diagnosis not present

## 2021-03-14 DIAGNOSIS — Z7982 Long term (current) use of aspirin: Secondary | ICD-10-CM | POA: Diagnosis not present

## 2021-03-14 DIAGNOSIS — K219 Gastro-esophageal reflux disease without esophagitis: Secondary | ICD-10-CM | POA: Diagnosis not present

## 2021-03-14 DIAGNOSIS — E785 Hyperlipidemia, unspecified: Secondary | ICD-10-CM | POA: Diagnosis not present

## 2021-03-14 DIAGNOSIS — K5900 Constipation, unspecified: Secondary | ICD-10-CM | POA: Diagnosis not present

## 2021-03-14 DIAGNOSIS — I110 Hypertensive heart disease with heart failure: Secondary | ICD-10-CM | POA: Diagnosis not present

## 2021-03-16 ENCOUNTER — Other Ambulatory Visit: Payer: Self-pay | Admitting: Family Medicine

## 2021-03-16 DIAGNOSIS — Z9981 Dependence on supplemental oxygen: Secondary | ICD-10-CM | POA: Diagnosis not present

## 2021-03-16 DIAGNOSIS — D72829 Elevated white blood cell count, unspecified: Secondary | ICD-10-CM | POA: Diagnosis not present

## 2021-03-16 DIAGNOSIS — E039 Hypothyroidism, unspecified: Secondary | ICD-10-CM | POA: Diagnosis not present

## 2021-03-16 DIAGNOSIS — I2721 Secondary pulmonary arterial hypertension: Secondary | ICD-10-CM | POA: Diagnosis not present

## 2021-03-16 DIAGNOSIS — G4733 Obstructive sleep apnea (adult) (pediatric): Secondary | ICD-10-CM | POA: Diagnosis not present

## 2021-03-16 DIAGNOSIS — E785 Hyperlipidemia, unspecified: Secondary | ICD-10-CM | POA: Diagnosis not present

## 2021-03-16 DIAGNOSIS — I110 Hypertensive heart disease with heart failure: Secondary | ICD-10-CM | POA: Diagnosis not present

## 2021-03-16 DIAGNOSIS — K219 Gastro-esophageal reflux disease without esophagitis: Secondary | ICD-10-CM | POA: Diagnosis not present

## 2021-03-16 DIAGNOSIS — G20 Parkinson's disease: Secondary | ICD-10-CM | POA: Diagnosis not present

## 2021-03-16 DIAGNOSIS — J9611 Chronic respiratory failure with hypoxia: Secondary | ICD-10-CM | POA: Diagnosis not present

## 2021-03-16 DIAGNOSIS — I252 Old myocardial infarction: Secondary | ICD-10-CM | POA: Diagnosis not present

## 2021-03-16 DIAGNOSIS — Z9989 Dependence on other enabling machines and devices: Secondary | ICD-10-CM | POA: Diagnosis not present

## 2021-03-16 DIAGNOSIS — M545 Low back pain, unspecified: Secondary | ICD-10-CM | POA: Diagnosis not present

## 2021-03-16 DIAGNOSIS — I69391 Dysphagia following cerebral infarction: Secondary | ICD-10-CM | POA: Diagnosis not present

## 2021-03-16 DIAGNOSIS — K5900 Constipation, unspecified: Secondary | ICD-10-CM | POA: Diagnosis not present

## 2021-03-16 DIAGNOSIS — F32A Depression, unspecified: Secondary | ICD-10-CM | POA: Diagnosis not present

## 2021-03-16 DIAGNOSIS — G8929 Other chronic pain: Secondary | ICD-10-CM | POA: Diagnosis not present

## 2021-03-16 DIAGNOSIS — Z7982 Long term (current) use of aspirin: Secondary | ICD-10-CM | POA: Diagnosis not present

## 2021-03-16 DIAGNOSIS — I69354 Hemiplegia and hemiparesis following cerebral infarction affecting left non-dominant side: Secondary | ICD-10-CM | POA: Diagnosis not present

## 2021-03-16 DIAGNOSIS — I5032 Chronic diastolic (congestive) heart failure: Secondary | ICD-10-CM | POA: Diagnosis not present

## 2021-03-21 DIAGNOSIS — G8929 Other chronic pain: Secondary | ICD-10-CM | POA: Diagnosis not present

## 2021-03-21 DIAGNOSIS — D72829 Elevated white blood cell count, unspecified: Secondary | ICD-10-CM | POA: Diagnosis not present

## 2021-03-21 DIAGNOSIS — I69354 Hemiplegia and hemiparesis following cerebral infarction affecting left non-dominant side: Secondary | ICD-10-CM | POA: Diagnosis not present

## 2021-03-21 DIAGNOSIS — F32A Depression, unspecified: Secondary | ICD-10-CM | POA: Diagnosis not present

## 2021-03-21 DIAGNOSIS — G20 Parkinson's disease: Secondary | ICD-10-CM | POA: Diagnosis not present

## 2021-03-21 DIAGNOSIS — Z7982 Long term (current) use of aspirin: Secondary | ICD-10-CM | POA: Diagnosis not present

## 2021-03-21 DIAGNOSIS — J9611 Chronic respiratory failure with hypoxia: Secondary | ICD-10-CM | POA: Diagnosis not present

## 2021-03-21 DIAGNOSIS — I5032 Chronic diastolic (congestive) heart failure: Secondary | ICD-10-CM | POA: Diagnosis not present

## 2021-03-21 DIAGNOSIS — M545 Low back pain, unspecified: Secondary | ICD-10-CM | POA: Diagnosis not present

## 2021-03-21 DIAGNOSIS — G4733 Obstructive sleep apnea (adult) (pediatric): Secondary | ICD-10-CM | POA: Diagnosis not present

## 2021-03-21 DIAGNOSIS — I252 Old myocardial infarction: Secondary | ICD-10-CM | POA: Diagnosis not present

## 2021-03-21 DIAGNOSIS — Z9981 Dependence on supplemental oxygen: Secondary | ICD-10-CM | POA: Diagnosis not present

## 2021-03-21 DIAGNOSIS — I69391 Dysphagia following cerebral infarction: Secondary | ICD-10-CM | POA: Diagnosis not present

## 2021-03-21 DIAGNOSIS — I2721 Secondary pulmonary arterial hypertension: Secondary | ICD-10-CM | POA: Diagnosis not present

## 2021-03-21 DIAGNOSIS — E039 Hypothyroidism, unspecified: Secondary | ICD-10-CM | POA: Diagnosis not present

## 2021-03-21 DIAGNOSIS — E785 Hyperlipidemia, unspecified: Secondary | ICD-10-CM | POA: Diagnosis not present

## 2021-03-21 DIAGNOSIS — I110 Hypertensive heart disease with heart failure: Secondary | ICD-10-CM | POA: Diagnosis not present

## 2021-03-21 DIAGNOSIS — K5900 Constipation, unspecified: Secondary | ICD-10-CM | POA: Diagnosis not present

## 2021-03-21 DIAGNOSIS — K219 Gastro-esophageal reflux disease without esophagitis: Secondary | ICD-10-CM | POA: Diagnosis not present

## 2021-03-21 DIAGNOSIS — Z9989 Dependence on other enabling machines and devices: Secondary | ICD-10-CM | POA: Diagnosis not present

## 2021-03-22 DIAGNOSIS — K219 Gastro-esophageal reflux disease without esophagitis: Secondary | ICD-10-CM | POA: Diagnosis not present

## 2021-03-22 DIAGNOSIS — J9611 Chronic respiratory failure with hypoxia: Secondary | ICD-10-CM | POA: Diagnosis not present

## 2021-03-22 DIAGNOSIS — Z9989 Dependence on other enabling machines and devices: Secondary | ICD-10-CM | POA: Diagnosis not present

## 2021-03-22 DIAGNOSIS — E785 Hyperlipidemia, unspecified: Secondary | ICD-10-CM | POA: Diagnosis not present

## 2021-03-22 DIAGNOSIS — I110 Hypertensive heart disease with heart failure: Secondary | ICD-10-CM | POA: Diagnosis not present

## 2021-03-22 DIAGNOSIS — I252 Old myocardial infarction: Secondary | ICD-10-CM | POA: Diagnosis not present

## 2021-03-22 DIAGNOSIS — I2721 Secondary pulmonary arterial hypertension: Secondary | ICD-10-CM | POA: Diagnosis not present

## 2021-03-22 DIAGNOSIS — I69354 Hemiplegia and hemiparesis following cerebral infarction affecting left non-dominant side: Secondary | ICD-10-CM | POA: Diagnosis not present

## 2021-03-22 DIAGNOSIS — G20 Parkinson's disease: Secondary | ICD-10-CM | POA: Diagnosis not present

## 2021-03-22 DIAGNOSIS — D72829 Elevated white blood cell count, unspecified: Secondary | ICD-10-CM | POA: Diagnosis not present

## 2021-03-22 DIAGNOSIS — Z9981 Dependence on supplemental oxygen: Secondary | ICD-10-CM | POA: Diagnosis not present

## 2021-03-22 DIAGNOSIS — I69391 Dysphagia following cerebral infarction: Secondary | ICD-10-CM | POA: Diagnosis not present

## 2021-03-22 DIAGNOSIS — G4733 Obstructive sleep apnea (adult) (pediatric): Secondary | ICD-10-CM | POA: Diagnosis not present

## 2021-03-22 DIAGNOSIS — Z7982 Long term (current) use of aspirin: Secondary | ICD-10-CM | POA: Diagnosis not present

## 2021-03-22 DIAGNOSIS — E039 Hypothyroidism, unspecified: Secondary | ICD-10-CM | POA: Diagnosis not present

## 2021-03-22 DIAGNOSIS — I5032 Chronic diastolic (congestive) heart failure: Secondary | ICD-10-CM | POA: Diagnosis not present

## 2021-03-22 DIAGNOSIS — K5900 Constipation, unspecified: Secondary | ICD-10-CM | POA: Diagnosis not present

## 2021-03-22 DIAGNOSIS — M545 Low back pain, unspecified: Secondary | ICD-10-CM | POA: Diagnosis not present

## 2021-03-22 DIAGNOSIS — F32A Depression, unspecified: Secondary | ICD-10-CM | POA: Diagnosis not present

## 2021-03-22 DIAGNOSIS — G8929 Other chronic pain: Secondary | ICD-10-CM | POA: Diagnosis not present

## 2021-03-25 DIAGNOSIS — I10 Essential (primary) hypertension: Secondary | ICD-10-CM | POA: Diagnosis not present

## 2021-03-25 DIAGNOSIS — E871 Hypo-osmolality and hyponatremia: Secondary | ICD-10-CM | POA: Diagnosis not present

## 2021-03-25 DIAGNOSIS — J9611 Chronic respiratory failure with hypoxia: Secondary | ICD-10-CM | POA: Diagnosis not present

## 2021-03-27 NOTE — Telephone Encounter (Unsigned)
Please disregard ,opened in error 

## 2021-03-28 DIAGNOSIS — I5032 Chronic diastolic (congestive) heart failure: Secondary | ICD-10-CM | POA: Diagnosis not present

## 2021-03-28 DIAGNOSIS — I110 Hypertensive heart disease with heart failure: Secondary | ICD-10-CM | POA: Diagnosis not present

## 2021-03-28 DIAGNOSIS — G4733 Obstructive sleep apnea (adult) (pediatric): Secondary | ICD-10-CM | POA: Diagnosis not present

## 2021-03-28 DIAGNOSIS — G20 Parkinson's disease: Secondary | ICD-10-CM | POA: Diagnosis not present

## 2021-03-28 DIAGNOSIS — I69354 Hemiplegia and hemiparesis following cerebral infarction affecting left non-dominant side: Secondary | ICD-10-CM | POA: Diagnosis not present

## 2021-03-28 DIAGNOSIS — D72829 Elevated white blood cell count, unspecified: Secondary | ICD-10-CM | POA: Diagnosis not present

## 2021-03-28 DIAGNOSIS — J9611 Chronic respiratory failure with hypoxia: Secondary | ICD-10-CM | POA: Diagnosis not present

## 2021-03-28 DIAGNOSIS — I252 Old myocardial infarction: Secondary | ICD-10-CM | POA: Diagnosis not present

## 2021-03-28 DIAGNOSIS — I2721 Secondary pulmonary arterial hypertension: Secondary | ICD-10-CM | POA: Diagnosis not present

## 2021-03-28 DIAGNOSIS — Z9981 Dependence on supplemental oxygen: Secondary | ICD-10-CM | POA: Diagnosis not present

## 2021-03-28 DIAGNOSIS — Z7982 Long term (current) use of aspirin: Secondary | ICD-10-CM | POA: Diagnosis not present

## 2021-03-28 DIAGNOSIS — E785 Hyperlipidemia, unspecified: Secondary | ICD-10-CM | POA: Diagnosis not present

## 2021-03-28 DIAGNOSIS — F32A Depression, unspecified: Secondary | ICD-10-CM | POA: Diagnosis not present

## 2021-03-28 DIAGNOSIS — E039 Hypothyroidism, unspecified: Secondary | ICD-10-CM | POA: Diagnosis not present

## 2021-03-28 DIAGNOSIS — G8929 Other chronic pain: Secondary | ICD-10-CM | POA: Diagnosis not present

## 2021-03-28 DIAGNOSIS — I69391 Dysphagia following cerebral infarction: Secondary | ICD-10-CM | POA: Diagnosis not present

## 2021-03-28 DIAGNOSIS — Z9989 Dependence on other enabling machines and devices: Secondary | ICD-10-CM | POA: Diagnosis not present

## 2021-03-28 DIAGNOSIS — K219 Gastro-esophageal reflux disease without esophagitis: Secondary | ICD-10-CM | POA: Diagnosis not present

## 2021-03-28 DIAGNOSIS — K5900 Constipation, unspecified: Secondary | ICD-10-CM | POA: Diagnosis not present

## 2021-03-28 DIAGNOSIS — M545 Low back pain, unspecified: Secondary | ICD-10-CM | POA: Diagnosis not present

## 2021-03-31 DIAGNOSIS — R0902 Hypoxemia: Secondary | ICD-10-CM | POA: Diagnosis not present

## 2021-03-31 DIAGNOSIS — B9562 Methicillin resistant Staphylococcus aureus infection as the cause of diseases classified elsewhere: Secondary | ICD-10-CM | POA: Diagnosis not present

## 2021-03-31 DIAGNOSIS — J9601 Acute respiratory failure with hypoxia: Secondary | ICD-10-CM | POA: Diagnosis not present

## 2021-03-31 DIAGNOSIS — I6789 Other cerebrovascular disease: Secondary | ICD-10-CM | POA: Diagnosis not present

## 2021-03-31 DIAGNOSIS — R7881 Bacteremia: Secondary | ICD-10-CM | POA: Diagnosis not present

## 2021-04-02 DIAGNOSIS — J9601 Acute respiratory failure with hypoxia: Secondary | ICD-10-CM | POA: Diagnosis not present

## 2021-04-02 DIAGNOSIS — R7881 Bacteremia: Secondary | ICD-10-CM | POA: Diagnosis not present

## 2021-04-02 DIAGNOSIS — J441 Chronic obstructive pulmonary disease with (acute) exacerbation: Secondary | ICD-10-CM | POA: Diagnosis not present

## 2021-04-02 DIAGNOSIS — I6789 Other cerebrovascular disease: Secondary | ICD-10-CM | POA: Diagnosis not present

## 2021-04-03 ENCOUNTER — Ambulatory Visit: Payer: Medicare Other | Admitting: Adult Health

## 2021-04-03 ENCOUNTER — Ambulatory Visit: Payer: Medicare Other | Admitting: Primary Care

## 2021-04-03 NOTE — Progress Notes (Signed)
@Patient  ID: , female    DOB: 1923-12-25, 87 y.o.   MRN: [id number]  No chief complaint on file.   Referring provider: Just, [id number], FNP  HPI: 87 year old female, never smoked.  Past medical history significant for chronic diastolic heart failure, hypertension, NSTEMI, severe pulmonary arterial systolic hypertension, aspiration pneumonia, chronic respiratory failure, OSA on CPAP, GERD, hypothyroidism, SIADH Parkinson's disease.  Patient of Dr. 97, last seen in office on 10/05/2020.  Previous [facility] pulm encounter: 10/05/2020/Dr. 13/02/2020 87 year old woman with pulmonary hypertension chronic hypoxic respiratory failure referred for evaluation of sleep apnea. She is accompanied by her son 89 who reports that she does not have sleep apnea, the problem seems to be elevated, Level  Last office visit 05/2020 reviewed, oxygen saturation was 88% on room air and she qualified for oxygen improved to 1.5 to 2 L.  I have reviewed prior hospitalization and discharge summary.  Seems like this patient does not carry a diagnosis of OSA although this has been noted multiple times by previous providers.  She was provided with noninvasive ventilation/trilogy machine after hospitalization February 2020 for  acute hypercarbic respiratory failure .  She has been maintained on NIV since then.  She reports being frustrated with the machine, she does not like the full facemask she has a productive cough and has to spit into her machine and cannot tolerate the full facemask. She has a cough for 3 weeks with clear mucus, Covid testing was negative She is still trying to be compliant with her machine in spite of these issues  Epworth sleepiness score is 8. Bedtime is between 10 and 11 PM, sleep latency is 20 to 30 minutes, reports 1-2 nocturnal awakenings and is out of bed at 8 AM feeling tired without dryness of mouth or headaches  09/2020 bicarbonate 35 Serial chest x-rays reviewed which show chronic  right elevated hemidiaphragm which dates back to 2012  04/04/2021-interim history Patient presents today for 16-month follow-up.  Dr. 8-month, seen for sleep consult by Dr. Tonia Brooms November 2021. Patient has never had sleep studies.  Patient has been on trilogy machine since February 2020 after hospitalization for hypercarbic respiratory failure d/t elevated right hemidiaphragm, pulmonary hypertension.  During last visit she reported problems with fullface mask, this was changed to AirFit N 30 nasal cradle mask.  If unable to tolerate new mask may consider discontinuing noninvasive ventilator if continues to be a burden d/t quality of life. Maintained on 1.5 L of oxygen.  Signs of increased carbon dioxide level to monitor for RR confusion, increased sleepiness, increased swelling in feet.       Allergies  Allergen Reactions  . Crestor [Rosuvastatin Calcium] Other (See Comments)    Muscle Aches  . Keflex [Cephalexin] Other (See Comments)    dizziness  . Peanut Butter Flavor Hives  . Sulfa Antibiotics Hives  . Cephalexin Other (See Comments)    dizziness  . Sulfa Drugs Cross Reactors Rash    Immunization History  Administered Date(s) Administered  . Fluad Quad(high Dose 65+) 08/07/2019, 08/12/2020  . Influenza-Unspecified 09/02/2013, 09/15/2018  . PFIZER(Purple Top)SARS-COV-2 Vaccination 06/30/2020, 07/21/2020  . Pneumococcal Conjugate-13 08/05/2018  . Pneumococcal-Unspecified 09/02/2013    Past Medical History:  Diagnosis Date  . Anxiety   . Chronic lower back pain   . Constipation    h/o fecal disimpaction on 2017  . CVA (cerebral vascular accident) (HCC) 2015   neg workup, MRI small acute lacunar infarcts. ASA only  . Depression   .  Diastolic CHF, chronic (HCC)    grade I, most recent echo Jan 2019  . Fatty liver   . GERD (gastroesophageal reflux disease)   . Hyperlipidemia   . Hypertension   . Hyponatremia    multiple episodes with encephalopathy, renal thinks SIADH  .  Hypothyroidism, unspecified   . Internal hemorrhoid   . NSTEMI (non-ST elevated myocardial infarction) (HCC) 08/28/2016  . Palpitations   . PFO with atrial septal aneurysm    TTE 2015  . Pneumonia 2016  . Severe pulmonary arterial systolic hypertension (HCC) 12/25/2017   per echo, on 1L oxgen via [place]    Tobacco History: Social History   Tobacco Use  Smoking Status Never Smoker  Smokeless Tobacco Former Neurosurgeon  . Types: Snuff  Tobacco Comment   "used snuff when I was 19"   Counseling given: Not Answered Comment: "used snuff when I was 19"   Outpatient Medications Prior to Visit  Medication Sig Dispense Refill  . albuterol (PROVENTIL) (2.5 MG/3ML) 0.083% nebulizer solution USE VIA NEBULIZER EVERY 4 HOURS AS NEEDED FOR SHORTNESS OF BREATH OR WHEEZING 75 mL 2  . ALPRAZolam (XANAX) 1 MG tablet TAKE 1 TABLET(1 MG) BY MOUTH TWICE DAILY AS NEEDED FOR ANXIETY 60 tablet 0  . amLODipine (NORVASC) 10 MG tablet TAKE 1 TABLET(10 MG) BY MOUTH DAILY 90 tablet 3  . Aspirin 81 MG EC tablet Take 81 mg by mouth daily.    Marland Kitchen azelastine (ASTELIN) 0.1 % nasal spray Place 1 spray into both nostrils 2 (two) times daily. Use in each nostril as directed 30 mL 12  . cetirizine (ZYRTEC) 10 MG tablet Take 10 mg by mouth daily.    . clotrimazole-betamethasone (LOTRISONE) cream Apply 1 application topically 2 (two) times daily. 30 g 2  . demeclocycline (DECLOMYCIN) 300 MG tablet Take 0.5 tablets (150 mg total) by mouth 2 (two) times daily. 60 tablet 5  . diclofenac sodium (VOLTAREN) 1 % GEL Apply 2 g topically 4 (four) times daily. 100 g 5  . famotidine (PEPCID) 20 MG tablet Take 1 tablet (20 mg total) by mouth 2 (two) times daily. 90 tablet 3  . fluticasone (FLONASE) 50 MCG/ACT nasal spray SHAKE LIQUID AND USE 1 SPRAY IN EACH NOSTRIL TWICE DAILY 16 g 5  . glycerin adult 2 g suppository Place 1 suppository rectally as needed for constipation. 12 suppository 2  . guaiFENesin (MUCINEX) 600 MG 12 hr tablet Take  1 tablet (600 mg total) by mouth 2 (two) times daily as needed for to loosen phlegm. 60 tablet 5  . hydrALAZINE (APRESOLINE) 10 MG tablet TAKE 1 TABLET(10 MG) BY MOUTH TWICE DAILY 60 tablet 3  . ipratropium (ATROVENT) 0.03 % nasal spray USE 2 SPRAYS IN EACH NOSTRIL TWICE DAILY 30 mL 6  . levothyroxine (SYNTHROID) 50 MCG tablet TAKE 1 TABLET(50 MCG) BY MOUTH DAILY BEFORE BREAKFAST 90 tablet 3  . meclizine (ANTIVERT) 12.5 MG tablet Take 1 tablet (12.5 mg total) by mouth 3 (three) times daily as needed for dizziness. 90 tablet 3  . olopatadine (PATANOL) 0.1 % ophthalmic solution Place 1 drop into both eyes daily as needed for allergies. 5 mL 5  . omeprazole (PRILOSEC) 20 MG capsule Take 20 mg by mouth 2 (two) times daily.    . propranolol ER (INDERAL LA) 60 MG 24 hr capsule TAKE 1 CAPSULE(60 MG) BY MOUTH DAILY 90 capsule 3  . Respiratory Therapy Supplies (NEBULIZER) DEVI Use with nebulized albuterol solution as needed for wheezing, cough or  shortness of breath 1 each 0  . senna (SENOKOT) 8.6 MG TABS tablet Take 1 tablet (8.6 mg total) by mouth daily. 120 tablet 3  . sodium chloride 1 g tablet Take 2 tablets (2 g total) by mouth 2 (two) times daily. 120 tablet 6   No facility-administered medications prior to visit.      Review of Systems  Review of Systems   Physical Exam  There were no vitals taken for this visit. Physical Exam   Lab Results:  CBC    Component Value Date/Time   WBC 7.8 09/22/2020 1517   WBC 10.5 01/07/2019 0652   RBC 3.60 (L) 09/22/2020 1517   RBC 3.12 (L) 01/07/2019 0652   HGB 10.2 (L) 09/22/2020 1517   HCT 33.4 (L) 09/22/2020 1517   PLT 235 09/22/2020 1517   MCV 93 09/22/2020 1517   MCH 28.3 09/22/2020 1517   MCH 28.8 01/07/2019 0652   MCHC 30.5 (L) 09/22/2020 1517   MCHC 29.7 (L) 01/07/2019 0652   RDW 12.6 09/22/2020 1517   LYMPHSABS 2.9 09/22/2020 1517   MONOABS 0.8 01/07/2019 0652   EOSABS 0.2 09/22/2020 1517   BASOSABS 0.0 09/22/2020 1517     BMET    Component Value Date/Time   NA 135 09/22/2020 1517   K 4.9 09/22/2020 1517   CL 88 (L) 09/22/2020 1517   CO2 35 (H) 09/22/2020 1517   GLUCOSE 100 (H) 09/22/2020 1517   GLUCOSE 84 05/31/2020 1630   BUN 14 09/22/2020 1517   CREATININE 0.67 09/22/2020 1517   CALCIUM 9.9 09/22/2020 1517   GFRNONAA 75 09/22/2020 1517   GFRAA 87 09/22/2020 1517    BNP    Component Value Date/Time   BNP 175.8 (H) 12/27/2018 1834    ProBNP No results found for: PROBNP  Imaging: No results found.   Assessment & Plan:   No problem-specific Assessment & Plan notes found for this encounter.     Glenford Bayley, NP 04/03/2021

## 2021-04-03 NOTE — Patient Instructions (Signed)
Signs of increased carbon dioxide level to monitor for RR confusion, increased sleepiness, increased swelling in feet.   Aim to wear trilogy 6 hours every night.

## 2021-04-04 ENCOUNTER — Encounter: Payer: Medicare Other | Admitting: Primary Care

## 2021-04-05 DIAGNOSIS — K5900 Constipation, unspecified: Secondary | ICD-10-CM | POA: Diagnosis not present

## 2021-04-05 DIAGNOSIS — E039 Hypothyroidism, unspecified: Secondary | ICD-10-CM | POA: Diagnosis not present

## 2021-04-05 DIAGNOSIS — I69354 Hemiplegia and hemiparesis following cerebral infarction affecting left non-dominant side: Secondary | ICD-10-CM | POA: Diagnosis not present

## 2021-04-05 DIAGNOSIS — I5032 Chronic diastolic (congestive) heart failure: Secondary | ICD-10-CM | POA: Diagnosis not present

## 2021-04-05 DIAGNOSIS — I2721 Secondary pulmonary arterial hypertension: Secondary | ICD-10-CM | POA: Diagnosis not present

## 2021-04-05 DIAGNOSIS — I110 Hypertensive heart disease with heart failure: Secondary | ICD-10-CM | POA: Diagnosis not present

## 2021-04-05 DIAGNOSIS — F32A Depression, unspecified: Secondary | ICD-10-CM | POA: Diagnosis not present

## 2021-04-05 DIAGNOSIS — K219 Gastro-esophageal reflux disease without esophagitis: Secondary | ICD-10-CM | POA: Diagnosis not present

## 2021-04-05 DIAGNOSIS — Z7982 Long term (current) use of aspirin: Secondary | ICD-10-CM | POA: Diagnosis not present

## 2021-04-05 DIAGNOSIS — G4733 Obstructive sleep apnea (adult) (pediatric): Secondary | ICD-10-CM | POA: Diagnosis not present

## 2021-04-05 DIAGNOSIS — Z9989 Dependence on other enabling machines and devices: Secondary | ICD-10-CM | POA: Diagnosis not present

## 2021-04-05 DIAGNOSIS — J9611 Chronic respiratory failure with hypoxia: Secondary | ICD-10-CM | POA: Diagnosis not present

## 2021-04-05 DIAGNOSIS — I252 Old myocardial infarction: Secondary | ICD-10-CM | POA: Diagnosis not present

## 2021-04-05 DIAGNOSIS — Z9981 Dependence on supplemental oxygen: Secondary | ICD-10-CM | POA: Diagnosis not present

## 2021-04-05 DIAGNOSIS — I69391 Dysphagia following cerebral infarction: Secondary | ICD-10-CM | POA: Diagnosis not present

## 2021-04-05 DIAGNOSIS — M545 Low back pain, unspecified: Secondary | ICD-10-CM | POA: Diagnosis not present

## 2021-04-05 DIAGNOSIS — E785 Hyperlipidemia, unspecified: Secondary | ICD-10-CM | POA: Diagnosis not present

## 2021-04-05 DIAGNOSIS — D72829 Elevated white blood cell count, unspecified: Secondary | ICD-10-CM | POA: Diagnosis not present

## 2021-04-05 DIAGNOSIS — G8929 Other chronic pain: Secondary | ICD-10-CM | POA: Diagnosis not present

## 2021-04-05 DIAGNOSIS — G20 Parkinson's disease: Secondary | ICD-10-CM | POA: Diagnosis not present

## 2021-04-06 ENCOUNTER — Ambulatory Visit: Payer: Medicare Other | Admitting: Primary Care

## 2021-04-06 DIAGNOSIS — M545 Low back pain, unspecified: Secondary | ICD-10-CM | POA: Diagnosis not present

## 2021-04-06 DIAGNOSIS — I2721 Secondary pulmonary arterial hypertension: Secondary | ICD-10-CM | POA: Diagnosis not present

## 2021-04-06 DIAGNOSIS — J9611 Chronic respiratory failure with hypoxia: Secondary | ICD-10-CM | POA: Diagnosis not present

## 2021-04-06 DIAGNOSIS — K5900 Constipation, unspecified: Secondary | ICD-10-CM | POA: Diagnosis not present

## 2021-04-06 DIAGNOSIS — Z9989 Dependence on other enabling machines and devices: Secondary | ICD-10-CM | POA: Diagnosis not present

## 2021-04-06 DIAGNOSIS — G20 Parkinson's disease: Secondary | ICD-10-CM | POA: Diagnosis not present

## 2021-04-06 DIAGNOSIS — I5032 Chronic diastolic (congestive) heart failure: Secondary | ICD-10-CM | POA: Diagnosis not present

## 2021-04-06 DIAGNOSIS — I509 Heart failure, unspecified: Secondary | ICD-10-CM | POA: Diagnosis not present

## 2021-04-06 DIAGNOSIS — I110 Hypertensive heart disease with heart failure: Secondary | ICD-10-CM | POA: Diagnosis not present

## 2021-04-06 DIAGNOSIS — G8929 Other chronic pain: Secondary | ICD-10-CM | POA: Diagnosis not present

## 2021-04-06 DIAGNOSIS — I69391 Dysphagia following cerebral infarction: Secondary | ICD-10-CM | POA: Diagnosis not present

## 2021-04-06 DIAGNOSIS — I69354 Hemiplegia and hemiparesis following cerebral infarction affecting left non-dominant side: Secondary | ICD-10-CM | POA: Diagnosis not present

## 2021-04-06 DIAGNOSIS — E785 Hyperlipidemia, unspecified: Secondary | ICD-10-CM | POA: Diagnosis not present

## 2021-04-06 DIAGNOSIS — E039 Hypothyroidism, unspecified: Secondary | ICD-10-CM | POA: Diagnosis not present

## 2021-04-06 DIAGNOSIS — I252 Old myocardial infarction: Secondary | ICD-10-CM | POA: Diagnosis not present

## 2021-04-06 DIAGNOSIS — Z7982 Long term (current) use of aspirin: Secondary | ICD-10-CM | POA: Diagnosis not present

## 2021-04-06 DIAGNOSIS — I10 Essential (primary) hypertension: Secondary | ICD-10-CM | POA: Diagnosis not present

## 2021-04-06 DIAGNOSIS — D72829 Elevated white blood cell count, unspecified: Secondary | ICD-10-CM | POA: Diagnosis not present

## 2021-04-06 DIAGNOSIS — Z9981 Dependence on supplemental oxygen: Secondary | ICD-10-CM | POA: Diagnosis not present

## 2021-04-06 DIAGNOSIS — F32A Depression, unspecified: Secondary | ICD-10-CM | POA: Diagnosis not present

## 2021-04-06 DIAGNOSIS — E871 Hypo-osmolality and hyponatremia: Secondary | ICD-10-CM | POA: Diagnosis not present

## 2021-04-06 DIAGNOSIS — K219 Gastro-esophageal reflux disease without esophagitis: Secondary | ICD-10-CM | POA: Diagnosis not present

## 2021-04-06 DIAGNOSIS — G4733 Obstructive sleep apnea (adult) (pediatric): Secondary | ICD-10-CM | POA: Diagnosis not present

## 2021-04-07 ENCOUNTER — Telehealth: Payer: Self-pay | Admitting: Primary Care

## 2021-04-07 MED ORDER — AMOXICILLIN-POT CLAVULANATE 875-125 MG PO TABS: 1.0000 | ORAL_TABLET | Freq: Two times a day (BID) | ORAL | 0 refills | Status: DC

## 2021-04-07 NOTE — Telephone Encounter (Signed)
OK to call in augmentin [phone] bid x 7 days  Please offer OV next available with APP I see that she missed 5/3 appt

## 2021-04-07 NOTE — Telephone Encounter (Signed)
Called and spoke with pts son and he stated that the pt has been having these symptoms for about 1 month and are worse today.   Her oxygen sats drop to 93-94 % while she is sitting, she is having congestion that is clear when she coughs some up, ears are stuffy, pressure in her face, headaches.  Pt stated that she can take amoxicillin if RA will call it in.   RA please advise. Thanks  Allergies  Allergen Reactions  . Crestor [Rosuvastatin Calcium] Other (See Comments)    Muscle Aches  . Keflex [Cephalexin] Other (See Comments)    dizziness  . Peanut Butter Flavor Hives  . Sulfa Antibiotics Hives  . Cephalexin Other (See Comments)    dizziness  . Sulfa Drugs Cross Reactors Rash

## 2021-04-07 NOTE — Telephone Encounter (Signed)
Spoke with pt's son and notified of response per Dr Vassie Loll  He verbalized understanding  Rx sent to pharm  Appt scheduled for 04/11/21 with BW

## 2021-04-10 ENCOUNTER — Ambulatory Visit: Payer: Medicare Other | Admitting: Primary Care

## 2021-04-10 NOTE — Progress Notes (Deleted)
@Patient  ID: , female    DOB: 01-12-1924, 87 y.o.   MRN: [id number]  No chief complaint on file.   Referring provider: Just, [id number], FNP  HPI:   87 year old female, never smoked.  Past medical history significant for chronic diastolic heart failure, hypertension, NSTEMI, severe pulmonary arterial systolic hypertension, aspiration pneumonia, chronic respiratory failure, OSA on CPAP, GERD, hypothyroidism, SIADH Parkinson's disease.  Patient of Dr. 97, last seen in office on 10/05/2020.  Previous [facility] pulm encounter: 10/05/2020/Dr. 13/02/2020 87 year old woman with pulmonary hypertension chronic hypoxic respiratory failure referred for evaluation of sleep apnea. She is accompanied by her son 97 who reports that she does not have sleep apnea, the problem seems to be elevated, Level  Last office visit 05/2020 reviewed, oxygen saturation was 88% on room air and she qualified for oxygen improved to 1.5 to 2 L.  I have reviewed prior hospitalization and discharge summary.  Seems like this patient does not carry a diagnosis of OSA although this has been noted multiple times by previous providers.  She was provided with noninvasive ventilation/trilogy machine after hospitalization February 2020 for  acute hypercarbic respiratory failure .  She has been maintained on NIV since then.  She reports being frustrated with the machine, she does not like the full facemask she has a productive cough and has to spit into her machine and cannot tolerate the full facemask. She has a cough for 3 weeks with clear mucus, Covid testing was negative She is still trying to be compliant with her machine in spite of these issues  Epworth sleepiness score is 8. Bedtime is between 10 and 11 PM, sleep latency is 20 to 30 minutes, reports 1-2 nocturnal awakenings and is out of bed at 8 AM feeling tired without dryness of mouth or headaches  09/2020 bicarbonate 35 Serial chest x-rays reviewed which show  chronic right elevated hemidiaphragm which dates back to 2012  04/11/2021 -interim history Patient presents today for 92-month follow-up.  Dr. 8-month, seen for sleep consult by Dr. Tonia Brooms November 2021. Patient has never had sleep studies.  Patient has been on trilogy machine since February 2020 after hospitalization for hypercarbic respiratory failure d/t elevated right hemidiaphragm, pulmonary hypertension.  During last visit she reported problems with fullface mask, this was changed to AirFit N 30 nasal cradle mask.  If unable to tolerate new mask may consider discontinuing noninvasive ventilator if continues to be a burden d/t quality of life. Maintained on 1.5 L of oxygen.  Signs of increased carbon dioxide level to monitor for RR confusion, increased sleepiness, increased swelling in feet.   She has an apt scheduled for 04/04/21 which was rescheduled for today.  ? congestion   Allergies  Allergen Reactions  . Crestor [Rosuvastatin Calcium] Other (See Comments)    Muscle Aches  . Keflex [Cephalexin] Other (See Comments)    dizziness  . Peanut Butter Flavor Hives  . Sulfa Antibiotics Hives  . Cephalexin Other (See Comments)    dizziness  . Sulfa Drugs Cross Reactors Rash    Immunization History  Administered Date(s) Administered  . Fluad Quad(high Dose 65+) 08/07/2019, 08/12/2020  . Influenza-Unspecified 09/02/2013, 09/15/2018  . PFIZER(Purple Top)SARS-COV-2 Vaccination 06/30/2020, 07/21/2020  . Pneumococcal Conjugate-13 08/05/2018  . Pneumococcal-Unspecified 09/02/2013    Past Medical History:  Diagnosis Date  . Anxiety   . Chronic lower back pain   . Constipation    h/o fecal disimpaction on 2017  . CVA (cerebral vascular accident) (HCC) 2015  neg workup, MRI small acute lacunar infarcts. ASA only  . Depression   . Diastolic CHF, chronic (HCC)    grade I, most recent echo Jan 2019  . Fatty liver   . GERD (gastroesophageal reflux disease)   . Hyperlipidemia   .  Hypertension   . Hyponatremia    multiple episodes with encephalopathy, renal thinks SIADH  . Hypothyroidism, unspecified   . Internal hemorrhoid   . NSTEMI (non-ST elevated myocardial infarction) (HCC) 08/28/2016  . Palpitations   . PFO with atrial septal aneurysm    TTE 2015  . Pneumonia 2016  . Severe pulmonary arterial systolic hypertension (HCC) 12/25/2017   per echo, on 1L oxgen via [place]    Tobacco History: Social History   Tobacco Use  Smoking Status Never Smoker  Smokeless Tobacco Former Neurosurgeon  . Types: Snuff  Tobacco Comment   "used snuff when I was 19"   Counseling given: Not Answered Comment: "used snuff when I was 19"   Outpatient Medications Prior to Visit  Medication Sig Dispense Refill  . albuterol (PROVENTIL) (2.5 MG/3ML) 0.083% nebulizer solution USE VIA NEBULIZER EVERY 4 HOURS AS NEEDED FOR SHORTNESS OF BREATH OR WHEEZING 75 mL 2  . ALPRAZolam (XANAX) 1 MG tablet TAKE 1 TABLET(1 MG) BY MOUTH TWICE DAILY AS NEEDED FOR ANXIETY 60 tablet 0  . amLODipine (NORVASC) 10 MG tablet TAKE 1 TABLET(10 MG) BY MOUTH DAILY 90 tablet 3  . amoxicillin-clavulanate (AUGMENTIN) 875-125 MG tablet Take 1 tablet by mouth 2 (two) times daily. 14 tablet 0  . Aspirin 81 MG EC tablet Take 81 mg by mouth daily.    Marland Kitchen azelastine (ASTELIN) 0.1 % nasal spray Place 1 spray into both nostrils 2 (two) times daily. Use in each nostril as directed 30 mL 12  . cetirizine (ZYRTEC) 10 MG tablet Take 10 mg by mouth daily.    . clotrimazole-betamethasone (LOTRISONE) cream Apply 1 application topically 2 (two) times daily. 30 g 2  . demeclocycline (DECLOMYCIN) 300 MG tablet Take 0.5 tablets (150 mg total) by mouth 2 (two) times daily. 60 tablet 5  . diclofenac sodium (VOLTAREN) 1 % GEL Apply 2 g topically 4 (four) times daily. 100 g 5  . famotidine (PEPCID) 20 MG tablet Take 1 tablet (20 mg total) by mouth 2 (two) times daily. 90 tablet 3  . fluticasone (FLONASE) 50 MCG/ACT nasal spray SHAKE LIQUID  AND USE 1 SPRAY IN EACH NOSTRIL TWICE DAILY 16 g 5  . glycerin adult 2 g suppository Place 1 suppository rectally as needed for constipation. 12 suppository 2  . guaiFENesin (MUCINEX) 600 MG 12 hr tablet Take 1 tablet (600 mg total) by mouth 2 (two) times daily as needed for to loosen phlegm. 60 tablet 5  . hydrALAZINE (APRESOLINE) 10 MG tablet TAKE 1 TABLET(10 MG) BY MOUTH TWICE DAILY 60 tablet 3  . ipratropium (ATROVENT) 0.03 % nasal spray USE 2 SPRAYS IN EACH NOSTRIL TWICE DAILY 30 mL 6  . levothyroxine (SYNTHROID) 50 MCG tablet TAKE 1 TABLET(50 MCG) BY MOUTH DAILY BEFORE BREAKFAST 90 tablet 3  . meclizine (ANTIVERT) 12.5 MG tablet Take 1 tablet (12.5 mg total) by mouth 3 (three) times daily as needed for dizziness. 90 tablet 3  . olopatadine (PATANOL) 0.1 % ophthalmic solution Place 1 drop into both eyes daily as needed for allergies. 5 mL 5  . omeprazole (PRILOSEC) 20 MG capsule Take 20 mg by mouth 2 (two) times daily.    . propranolol ER (INDERAL  LA) 60 MG 24 hr capsule TAKE 1 CAPSULE(60 MG) BY MOUTH DAILY 90 capsule 3  . Respiratory Therapy Supplies (NEBULIZER) DEVI Use with nebulized albuterol solution as needed for wheezing, cough or shortness of breath 1 each 0  . senna (SENOKOT) 8.6 MG TABS tablet Take 1 tablet (8.6 mg total) by mouth daily. 120 tablet 3  . sodium chloride 1 g tablet Take 2 tablets (2 g total) by mouth 2 (two) times daily. 120 tablet 6   No facility-administered medications prior to visit.      Review of Systems  Review of Systems   Physical Exam  There were no vitals taken for this visit. Physical Exam   Lab Results:  CBC    Component Value Date/Time   WBC 7.8 09/22/2020 1517   WBC 10.5 01/07/2019 0652   RBC 3.60 (L) 09/22/2020 1517   RBC 3.12 (L) 01/07/2019 0652   HGB 10.2 (L) 09/22/2020 1517   HCT 33.4 (L) 09/22/2020 1517   PLT 235 09/22/2020 1517   MCV 93 09/22/2020 1517   MCH 28.3 09/22/2020 1517   MCH 28.8 01/07/2019 0652   MCHC 30.5 (L)  09/22/2020 1517   MCHC 29.7 (L) 01/07/2019 0652   RDW 12.6 09/22/2020 1517   LYMPHSABS 2.9 09/22/2020 1517   MONOABS 0.8 01/07/2019 0652   EOSABS 0.2 09/22/2020 1517   BASOSABS 0.0 09/22/2020 1517    BMET    Component Value Date/Time   NA 135 09/22/2020 1517   K 4.9 09/22/2020 1517   CL 88 (L) 09/22/2020 1517   CO2 35 (H) 09/22/2020 1517   GLUCOSE 100 (H) 09/22/2020 1517   GLUCOSE 84 05/31/2020 1630   BUN 14 09/22/2020 1517   CREATININE 0.67 09/22/2020 1517   CALCIUM 9.9 09/22/2020 1517   GFRNONAA 75 09/22/2020 1517   GFRAA 87 09/22/2020 1517    BNP    Component Value Date/Time   BNP 175.8 (H) 12/27/2018 1834    ProBNP No results found for: PROBNP  Imaging: No results found.   Assessment & Plan:   No problem-specific Assessment & Plan notes found for this encounter.     Glenford Bayley, NP 04/10/2021

## 2021-04-11 ENCOUNTER — Ambulatory Visit: Payer: Medicare Other | Admitting: Primary Care

## 2021-04-12 DIAGNOSIS — I110 Hypertensive heart disease with heart failure: Secondary | ICD-10-CM | POA: Diagnosis not present

## 2021-04-12 DIAGNOSIS — G8929 Other chronic pain: Secondary | ICD-10-CM | POA: Diagnosis not present

## 2021-04-12 DIAGNOSIS — I69391 Dysphagia following cerebral infarction: Secondary | ICD-10-CM | POA: Diagnosis not present

## 2021-04-12 DIAGNOSIS — Z9981 Dependence on supplemental oxygen: Secondary | ICD-10-CM | POA: Diagnosis not present

## 2021-04-12 DIAGNOSIS — M545 Low back pain, unspecified: Secondary | ICD-10-CM | POA: Diagnosis not present

## 2021-04-12 DIAGNOSIS — F32A Depression, unspecified: Secondary | ICD-10-CM | POA: Diagnosis not present

## 2021-04-12 DIAGNOSIS — I2721 Secondary pulmonary arterial hypertension: Secondary | ICD-10-CM | POA: Diagnosis not present

## 2021-04-12 DIAGNOSIS — Z7982 Long term (current) use of aspirin: Secondary | ICD-10-CM | POA: Diagnosis not present

## 2021-04-12 DIAGNOSIS — G4733 Obstructive sleep apnea (adult) (pediatric): Secondary | ICD-10-CM | POA: Diagnosis not present

## 2021-04-12 DIAGNOSIS — G20 Parkinson's disease: Secondary | ICD-10-CM | POA: Diagnosis not present

## 2021-04-12 DIAGNOSIS — E039 Hypothyroidism, unspecified: Secondary | ICD-10-CM | POA: Diagnosis not present

## 2021-04-12 DIAGNOSIS — D72829 Elevated white blood cell count, unspecified: Secondary | ICD-10-CM | POA: Diagnosis not present

## 2021-04-12 DIAGNOSIS — Z9989 Dependence on other enabling machines and devices: Secondary | ICD-10-CM | POA: Diagnosis not present

## 2021-04-12 DIAGNOSIS — I69354 Hemiplegia and hemiparesis following cerebral infarction affecting left non-dominant side: Secondary | ICD-10-CM | POA: Diagnosis not present

## 2021-04-12 DIAGNOSIS — J9611 Chronic respiratory failure with hypoxia: Secondary | ICD-10-CM | POA: Diagnosis not present

## 2021-04-12 DIAGNOSIS — K219 Gastro-esophageal reflux disease without esophagitis: Secondary | ICD-10-CM | POA: Diagnosis not present

## 2021-04-12 DIAGNOSIS — I5032 Chronic diastolic (congestive) heart failure: Secondary | ICD-10-CM | POA: Diagnosis not present

## 2021-04-12 DIAGNOSIS — K5900 Constipation, unspecified: Secondary | ICD-10-CM | POA: Diagnosis not present

## 2021-04-12 DIAGNOSIS — E785 Hyperlipidemia, unspecified: Secondary | ICD-10-CM | POA: Diagnosis not present

## 2021-04-12 DIAGNOSIS — I252 Old myocardial infarction: Secondary | ICD-10-CM | POA: Diagnosis not present

## 2021-04-13 DIAGNOSIS — G20 Parkinson's disease: Secondary | ICD-10-CM | POA: Diagnosis not present

## 2021-04-13 DIAGNOSIS — D72829 Elevated white blood cell count, unspecified: Secondary | ICD-10-CM | POA: Diagnosis not present

## 2021-04-13 DIAGNOSIS — Z7982 Long term (current) use of aspirin: Secondary | ICD-10-CM | POA: Diagnosis not present

## 2021-04-13 DIAGNOSIS — M545 Low back pain, unspecified: Secondary | ICD-10-CM | POA: Diagnosis not present

## 2021-04-13 DIAGNOSIS — G4733 Obstructive sleep apnea (adult) (pediatric): Secondary | ICD-10-CM | POA: Diagnosis not present

## 2021-04-13 DIAGNOSIS — I69354 Hemiplegia and hemiparesis following cerebral infarction affecting left non-dominant side: Secondary | ICD-10-CM | POA: Diagnosis not present

## 2021-04-13 DIAGNOSIS — F32A Depression, unspecified: Secondary | ICD-10-CM | POA: Diagnosis not present

## 2021-04-13 DIAGNOSIS — G8929 Other chronic pain: Secondary | ICD-10-CM | POA: Diagnosis not present

## 2021-04-13 DIAGNOSIS — I5032 Chronic diastolic (congestive) heart failure: Secondary | ICD-10-CM | POA: Diagnosis not present

## 2021-04-13 DIAGNOSIS — K219 Gastro-esophageal reflux disease without esophagitis: Secondary | ICD-10-CM | POA: Diagnosis not present

## 2021-04-13 DIAGNOSIS — J9611 Chronic respiratory failure with hypoxia: Secondary | ICD-10-CM | POA: Diagnosis not present

## 2021-04-13 DIAGNOSIS — E785 Hyperlipidemia, unspecified: Secondary | ICD-10-CM | POA: Diagnosis not present

## 2021-04-13 DIAGNOSIS — I252 Old myocardial infarction: Secondary | ICD-10-CM | POA: Diagnosis not present

## 2021-04-13 DIAGNOSIS — E039 Hypothyroidism, unspecified: Secondary | ICD-10-CM | POA: Diagnosis not present

## 2021-04-13 DIAGNOSIS — I2721 Secondary pulmonary arterial hypertension: Secondary | ICD-10-CM | POA: Diagnosis not present

## 2021-04-13 DIAGNOSIS — Z9989 Dependence on other enabling machines and devices: Secondary | ICD-10-CM | POA: Diagnosis not present

## 2021-04-13 DIAGNOSIS — I110 Hypertensive heart disease with heart failure: Secondary | ICD-10-CM | POA: Diagnosis not present

## 2021-04-13 DIAGNOSIS — Z9981 Dependence on supplemental oxygen: Secondary | ICD-10-CM | POA: Diagnosis not present

## 2021-04-13 DIAGNOSIS — I69391 Dysphagia following cerebral infarction: Secondary | ICD-10-CM | POA: Diagnosis not present

## 2021-04-13 DIAGNOSIS — K5900 Constipation, unspecified: Secondary | ICD-10-CM | POA: Diagnosis not present

## 2021-04-17 DIAGNOSIS — E785 Hyperlipidemia, unspecified: Secondary | ICD-10-CM | POA: Diagnosis not present

## 2021-04-17 DIAGNOSIS — G8929 Other chronic pain: Secondary | ICD-10-CM | POA: Diagnosis not present

## 2021-04-17 DIAGNOSIS — Z9981 Dependence on supplemental oxygen: Secondary | ICD-10-CM | POA: Diagnosis not present

## 2021-04-17 DIAGNOSIS — M545 Low back pain, unspecified: Secondary | ICD-10-CM | POA: Diagnosis not present

## 2021-04-17 DIAGNOSIS — R1310 Dysphagia, unspecified: Secondary | ICD-10-CM | POA: Diagnosis not present

## 2021-04-17 DIAGNOSIS — I69391 Dysphagia following cerebral infarction: Secondary | ICD-10-CM | POA: Diagnosis not present

## 2021-04-17 DIAGNOSIS — G20 Parkinson's disease: Secondary | ICD-10-CM | POA: Diagnosis not present

## 2021-04-17 DIAGNOSIS — F32A Depression, unspecified: Secondary | ICD-10-CM | POA: Diagnosis not present

## 2021-04-17 DIAGNOSIS — Z7982 Long term (current) use of aspirin: Secondary | ICD-10-CM | POA: Diagnosis not present

## 2021-04-17 DIAGNOSIS — E039 Hypothyroidism, unspecified: Secondary | ICD-10-CM | POA: Diagnosis not present

## 2021-04-17 DIAGNOSIS — K219 Gastro-esophageal reflux disease without esophagitis: Secondary | ICD-10-CM | POA: Diagnosis not present

## 2021-04-17 DIAGNOSIS — J9611 Chronic respiratory failure with hypoxia: Secondary | ICD-10-CM | POA: Diagnosis not present

## 2021-04-17 DIAGNOSIS — G4733 Obstructive sleep apnea (adult) (pediatric): Secondary | ICD-10-CM | POA: Diagnosis not present

## 2021-04-17 DIAGNOSIS — I252 Old myocardial infarction: Secondary | ICD-10-CM | POA: Diagnosis not present

## 2021-04-17 DIAGNOSIS — I110 Hypertensive heart disease with heart failure: Secondary | ICD-10-CM | POA: Diagnosis not present

## 2021-04-17 DIAGNOSIS — Z9181 History of falling: Secondary | ICD-10-CM | POA: Diagnosis not present

## 2021-04-17 DIAGNOSIS — I69354 Hemiplegia and hemiparesis following cerebral infarction affecting left non-dominant side: Secondary | ICD-10-CM | POA: Diagnosis not present

## 2021-04-17 DIAGNOSIS — I2721 Secondary pulmonary arterial hypertension: Secondary | ICD-10-CM | POA: Diagnosis not present

## 2021-04-17 DIAGNOSIS — I5032 Chronic diastolic (congestive) heart failure: Secondary | ICD-10-CM | POA: Diagnosis not present

## 2021-04-17 DIAGNOSIS — K5900 Constipation, unspecified: Secondary | ICD-10-CM | POA: Diagnosis not present

## 2021-04-18 ENCOUNTER — Encounter: Payer: Self-pay | Admitting: Primary Care

## 2021-04-18 ENCOUNTER — Other Ambulatory Visit: Payer: Self-pay

## 2021-04-18 ENCOUNTER — Ambulatory Visit (INDEPENDENT_AMBULATORY_CARE_PROVIDER_SITE_OTHER): Payer: Medicare Other | Admitting: Primary Care

## 2021-04-18 VITALS — BP 110/68 | HR 62 | Temp 97.5°F | Ht 62.0 in

## 2021-04-18 DIAGNOSIS — J320 Chronic maxillary sinusitis: Secondary | ICD-10-CM

## 2021-04-18 DIAGNOSIS — Z9989 Dependence on other enabling machines and devices: Secondary | ICD-10-CM

## 2021-04-18 DIAGNOSIS — J9611 Chronic respiratory failure with hypoxia: Secondary | ICD-10-CM

## 2021-04-18 DIAGNOSIS — G4733 Obstructive sleep apnea (adult) (pediatric): Secondary | ICD-10-CM | POA: Diagnosis not present

## 2021-04-18 LAB — BASIC METABOLIC PANEL
BUN: 17 mg/dL (ref 6–23)
CO2: 44 mEq/L — ABNORMAL HIGH (ref 19–32)
Calcium: 9.8 mg/dL (ref 8.4–10.5)
Chloride: 81 mEq/L — ABNORMAL LOW (ref 96–112)
Creatinine, Ser: 0.55 mg/dL (ref 0.40–1.20)
GFR: 78.3 mL/min (ref >60.00)
Glucose, Bld: 200 mg/dL — ABNORMAL HIGH (ref 70–99)
Potassium: 4.4 mEq/L (ref 3.5–5.1)
Sodium: 125 mEq/L — ABNORMAL LOW (ref 135–145)

## 2021-04-18 NOTE — Progress Notes (Signed)
@Patient  ID: , female    DOB: September 14, 1924, 87 y.o.   MRN: [id number]  Chief Complaint  Patient presents with  . Follow-up    Reports decreased oxygen this morning.     Referring provider: Just, [id number], FNP  HPI: 87 year old female, never smoked.  Past medical history significant for chronic diastolic heart failure, hypertension, NSTEMI, severe pulmonary arterial systolic hypertension, aspiration pneumonia, chronic respiratory failure, OSA on CPAP, GERD, hypothyroidism, SIADH Parkinson's disease.  Patient of Dr. 97, last seen in office on 10/05/2020.  Previous [facility] pulm encounter: 10/05/2020/Dr. 13/02/2020 87 year old woman with pulmonary hypertension chronic hypoxic respiratory failure referred for evaluation of sleep apnea. She is accompanied by her son 97 who reports that she does not have sleep apnea, the problem seems to be elevated, Level  Last office visit 05/2020 reviewed, oxygen saturation was 88% on room air and she qualified for oxygen improved to 1.5 to 2 L.  I have reviewed prior hospitalization and discharge summary.  Seems like this patient does not carry a diagnosis of OSA although this has been noted multiple times by previous providers.  She was provided with noninvasive ventilation/trilogy machine after hospitalization February 2020 for  acute hypercarbic respiratory failure .  She has been maintained on NIV since then.  She reports being frustrated with the machine, she does not like the full facemask she has a productive cough and has to spit into her machine and cannot tolerate the full facemask. She has a cough for 3 weeks with clear mucus, Covid testing was negative She is still trying to be compliant with her machine in spite of these issues  Epworth sleepiness score is 8. Bedtime is between 10 and 11 PM, sleep latency is 20 to 30 minutes, reports 1-2 nocturnal awakenings and is out of bed at 8 AM feeling tired without dryness of mouth or  headaches  09/2020 bicarbonate 35 Serial chest x-rays reviewed which show chronic right elevated hemidiaphragm which dates back to 2012  04/18/2021- interim hx  Patient presents today for 30-month follow-up.  Dr. 8-month, seen for sleep consult by Dr. Tonia Brooms November 2021. Patient has never had sleep studies.  Patient has been on trilogy machine since February 2020 after hospitalization for hypercarbic respiratory failure d/t elevated right hemidiaphragm, pulmonary hypertension.  During last visit she reported problems with fullface mask, this was changed to AirFit N 30 nasal cradle mask.  If unable to tolerate new mask may consider discontinuing noninvasive ventilator if continues to be a burden d/t quality of life. Maintained on 1.5 L of oxygen.  She had an apt scheduled for 04/04/21 which was rescheduled twice. She called our office with sinusitis symptoms and was sending in course of Augmentin 875 BID x 7 days. This helped some but she still reports having a lot of head congestion with clear mucus.. She is taking Zyrtec.   She is more sleepy during the day for the last few months. She is wearing Trilogy ventilator at night and during naps. Mask was leaking, respiratory therapist came oyt Friday. She got a new mask. The nasal mask did not work for her, currently using full face mask. Son reports she had labs drawn 1-2 weeks ago and her CO2 was 41, sodium was 128/127. She takes solidum replacement tabs. She is on 2L oxygen currently. She is getting physical therapy twice a week for 2 months.     Allergies  Allergen Reactions  . Crestor [Rosuvastatin Calcium] Other (See Comments)  Muscle Aches  . Keflex [Cephalexin] Other (See Comments)    dizziness  . Peanut Butter Flavor Hives  . Sulfa Antibiotics Hives  . Cephalexin Other (See Comments)    dizziness  . Sulfa Drugs Cross Reactors Rash    Immunization History  Administered Date(s) Administered  . Fluad Quad(high Dose 65+) 08/07/2019,  08/12/2020  . Influenza-Unspecified 09/02/2013, 09/15/2018  . PFIZER(Purple Top)SARS-COV-2 Vaccination 06/30/2020, 07/21/2020  . Pneumococcal Conjugate-13 08/05/2018  . Pneumococcal-Unspecified 09/02/2013    Past Medical History:  Diagnosis Date  . Anxiety   . Chronic lower back pain   . Constipation    h/o fecal disimpaction on 2017  . CVA (cerebral vascular accident) (HCC) 2015   neg workup, MRI small acute lacunar infarcts. ASA only  . Depression   . Diastolic CHF, chronic (HCC)    grade I, most recent echo Jan 2019  . Fatty liver   . GERD (gastroesophageal reflux disease)   . Hyperlipidemia   . Hypertension   . Hyponatremia    multiple episodes with encephalopathy, renal thinks SIADH  . Hypothyroidism, unspecified   . Internal hemorrhoid   . NSTEMI (non-ST elevated myocardial infarction) (HCC) 08/28/2016  . Palpitations   . PFO with atrial septal aneurysm    TTE 2015  . Pneumonia 2016  . Severe pulmonary arterial systolic hypertension (HCC) 12/25/2017   per echo, on 1L oxgen via [place]    Tobacco History: Social History   Tobacco Use  Smoking Status Never Smoker  Smokeless Tobacco Former NeurosurgeonUser  . Types: Snuff  Tobacco Comment   "used snuff when I was 19"   Counseling given: Not Answered Comment: "used snuff when I was 19"   Outpatient Medications Prior to Visit  Medication Sig Dispense Refill  . albuterol (PROVENTIL) (2.5 MG/3ML) 0.083% nebulizer solution USE 3ML VIA NEBULIZER EVERY 4 HOURS AS NEEDED FOR SHORTNESS OF BREATH OR WHEEZING 75 mL 2  . ALPRAZolam (XANAX) 1 MG tablet TAKE 1 TABLET(1 MG) BY MOUTH TWICE DAILY AS NEEDED FOR ANXIETY 60 tablet 0  . amLODipine (NORVASC) 10 MG tablet TAKE 1 TABLET(10 MG) BY MOUTH DAILY 90 tablet 3  . Aspirin 81 MG EC tablet Take 81 mg by mouth daily.    . cetirizine (ZYRTEC) 10 MG tablet Take 10 mg by mouth daily.    . clotrimazole-betamethasone (LOTRISONE) cream Apply 1 application topically 2 (two) times daily. 30 g 2  .  demeclocycline (DECLOMYCIN) 300 MG tablet Take 0.5 tablets (150 mg total) by mouth 2 (two) times daily. 60 tablet 5  . diclofenac sodium (VOLTAREN) 1 % GEL Apply 2 g topically 4 (four) times daily. 100 g 5  . famotidine (PEPCID) 20 MG tablet Take 1 tablet (20 mg total) by mouth 2 (two) times daily. 90 tablet 3  . fluticasone (FLONASE) 50 MCG/ACT nasal spray SHAKE LIQUID AND USE 1 SPRAY IN EACH NOSTRIL TWICE DAILY 16 g 5  . glycerin adult 2 g suppository Place 1 suppository rectally as needed for constipation. 12 suppository 2  . guaiFENesin (MUCINEX) 600 MG 12 hr tablet Take 1 tablet (600 mg total) by mouth 2 (two) times daily as needed for to loosen phlegm. 60 tablet 5  . hydrALAZINE (APRESOLINE) 10 MG tablet TAKE 1 TABLET(10 MG) BY MOUTH TWICE DAILY 60 tablet 3  . levothyroxine (SYNTHROID) 50 MCG tablet TAKE 1 TABLET(50 MCG) BY MOUTH DAILY BEFORE BREAKFAST 90 tablet 3  . meclizine (ANTIVERT) 12.5 MG tablet Take 1 tablet (12.5 mg total) by mouth 3 (  three) times daily as needed for dizziness. 90 tablet 3  . olopatadine (PATANOL) 0.1 % ophthalmic solution Place 1 drop into both eyes daily as needed for allergies. 5 mL 5  . omeprazole (PRILOSEC) 20 MG capsule Take 20 mg by mouth 2 (two) times daily.    . propranolol ER (INDERAL LA) 60 MG 24 hr capsule TAKE 1 CAPSULE(60 MG) BY MOUTH DAILY 90 capsule 3  . Respiratory Therapy Supplies (NEBULIZER) DEVI Use with nebulized albuterol solution as needed for wheezing, cough or shortness of breath 1 each 0  . senna (SENOKOT) 8.6 MG TABS tablet Take 1 tablet (8.6 mg total) by mouth daily. 120 tablet 3  . sodium chloride 1 g tablet Take 2 tablets (2 g total) by mouth 2 (two) times daily. 120 tablet 6  . azelastine (ASTELIN) 0.1 % nasal spray Place 1 spray into both nostrils 2 (two) times daily. Use in each nostril as directed (Patient not taking: Reported on 04/18/2021) 30 mL 12  . ipratropium (ATROVENT) 0.03 % nasal spray USE 2 SPRAYS IN EACH NOSTRIL TWICE DAILY  (Patient not taking: Reported on 04/18/2021) 30 mL 6  . amoxicillin-clavulanate (AUGMENTIN) 875-125 MG tablet Take 1 tablet by mouth 2 (two) times daily. 14 tablet 0   No facility-administered medications prior to visit.   Review of Systems  Review of Systems  Constitutional: Positive for fatigue.  HENT: Positive for congestion and sinus pressure.   Respiratory: Negative for cough, chest tightness, shortness of breath and wheezing.    Physical Exam  BP 110/68 (BP Location: Left Arm, Cuff Size: Normal)   Pulse 62   Temp (!) 97.5 F (36.4 C) (Temporal)   Ht 5\' 2"  (1.575 m)   SpO2 97% Comment: 2L  BMI 29.81 kg/m  Physical Exam Constitutional:      General: She is not in acute distress.    Appearance: Normal appearance. She is not ill-appearing.  HENT:     Mouth/Throat:     Mouth: Mucous membranes are moist.     Pharynx: Oropharynx is clear.  Cardiovascular:     Rate and Rhythm: Normal rate and regular rhythm.     Comments: Trace pedal edema Pulmonary:     Effort: Pulmonary effort is normal.     Breath sounds: Normal breath sounds. No wheezing or rhonchi.     Comments: Diminished, poor effort Musculoskeletal:     Comments: In WC  Skin:    General: Skin is warm and dry.  Neurological:     General: No focal deficit present.     Mental Status: She is alert and oriented to person, place, and time. Mental status is at baseline.  Psychiatric:        Mood and Affect: Mood normal.        Behavior: Behavior normal.        Thought Content: Thought content normal.        Judgment: Judgment normal.      Lab Results:  CBC    Component Value Date/Time   WBC 7.8 09/22/2020 1517   WBC 10.5 01/07/2019 0652   RBC 3.60 (L) 09/22/2020 1517   RBC 3.12 (L) 01/07/2019 0652   HGB 10.2 (L) 09/22/2020 1517   HCT 33.4 (L) 09/22/2020 1517   PLT 235 09/22/2020 1517   MCV 93 09/22/2020 1517   MCH 28.3 09/22/2020 1517   MCH 28.8 01/07/2019 0652   MCHC 30.5 (L) 09/22/2020 1517   MCHC  29.7 (L) 01/07/2019 0652   RDW  12.6 09/22/2020 1517   LYMPHSABS 2.9 09/22/2020 1517   MONOABS 0.8 01/07/2019 0652   EOSABS 0.2 09/22/2020 1517   BASOSABS 0.0 09/22/2020 1517    BMET    Component Value Date/Time   NA 135 09/22/2020 1517   K 4.9 09/22/2020 1517   CL 88 (L) 09/22/2020 1517   CO2 35 (H) 09/22/2020 1517   GLUCOSE 100 (H) 09/22/2020 1517   GLUCOSE 84 05/31/2020 1630   BUN 14 09/22/2020 1517   CREATININE 0.67 09/22/2020 1517   CALCIUM 9.9 09/22/2020 1517   GFRNONAA 75 09/22/2020 1517   GFRAA 87 09/22/2020 1517    BNP    Component Value Date/Time   BNP 175.8 (H) 12/27/2018 1834    ProBNP No results found for: PROBNP  Imaging: No results found.   Assessment & Plan:   OSA on CPAP - Increased daytime sleepiness over the last several months. She reports compliance with Trilogy. She did not tolerate nasal cradle mask, back to using full face mask. Respiratory therapist was our to their house on Friday 5/13. Son reports CO2 level was 41 a couple of weeks ago. Checking BMET today.    Chronic respiratory failure with hypoxia (HCC) - Hx chronic hypoxic and hypercarbic respiratory failure secondary to elevated right hemidiaphragm and pulmoary HTN. She has been onTrilogy ventilator since Feb 2020. Her O2 level today was 100% on 2L/min oxygen. She has been more sleepy during the day. No confusion or significant leg swelling. Checking BMET. Recommend decreasing oxygen to 1.5L/min. If O2 sustaining <88% may increase back up to 2L/min    Chronic sinusitis - Treated for acute sinusitis 5/6 with Augmentin 875mg  BID x 7 days, patient reports minimal improvement in congestion at best. She is compliant with Zyrtec, mucinex, saline nasal rinses and flonase. Referring to ENT d.t chronic sinusitis symptom.    , NP 04/18/2021

## 2021-04-18 NOTE — Progress Notes (Signed)
Please let patient know CO2 was 44 and her sodium was 125. She has moderate hyponatremia which does not appear new, she is on salt tabs.  She should follow free water restriction 1.5L. She needs to follow up with PCP or nephrology (if she follows with them) tomorrow to manage this.   I gave her recommendations about her oxygen. We have requested trilogy download. We may get ABGs, I will discuss with Dr. Vassie Loll. If she becomes more tired or becomes confused her needs to bring her to ED

## 2021-04-18 NOTE — Assessment & Plan Note (Signed)
-   Treated for acute sinusitis 5/6 with Augmentin 875mg  BID x 7 days, patient reports minimal improvement in congestion at best. She is compliant with Zyrtec, mucinex, saline nasal rinses and flonase. Referring to ENT d.t chronic sinusitis symptom.

## 2021-04-18 NOTE — Patient Instructions (Addendum)
Recommendations: - Continue Trilogy ventilator at night - Drop oxygen to 1.5L continuously (if O2 sustaining <88% than can increase to 2L) - Continue flonase, mucinex and saline nasal rinses  - If short of breath try using 1/2 albuterol nebulizer  - Signs of increased carbon dioxide level to monitor for confusion, increased sleepiness, increased swelling in feet  Orders: - Please request download on trilogy from [employer] - Labs today (BMET- ordered)  Refer: - ENT re: chronic maxillary sinusitis   Follow-up: - 3 months with Dr. Vassie Loll or sooner if needed

## 2021-04-18 NOTE — Assessment & Plan Note (Addendum)
-   Hx chronic hypoxic and hypercarbic respiratory failure secondary to elevated right hemidiaphragm and pulmoary HTN. She has been onTrilogy ventilator since Feb 2020. Her O2 level today was 100% on 2L/min oxygen. She has been more sleepy during the day. No confusion or significant leg swelling. Checking BMET. Recommend decreasing oxygen to 1.5L/min. If O2 sustaining <88% may increase back up to 2L/min

## 2021-04-18 NOTE — Assessment & Plan Note (Signed)
-   Increased daytime sleepiness over the last several months. She reports compliance with Trilogy. She did not tolerate nasal cradle mask, back to using full face mask. Respiratory therapist was our to their house on Friday 5/13. Son reports CO2 level was 41 a couple of weeks ago. Checking BMET today.

## 2021-04-19 ENCOUNTER — Encounter (HOSPITAL_COMMUNITY): Payer: Self-pay | Admitting: Emergency Medicine

## 2021-04-19 ENCOUNTER — Inpatient Hospital Stay (HOSPITAL_COMMUNITY)
Admission: EM | Admit: 2021-04-19 | Discharge: 2021-04-25 | DRG: 189 | Disposition: A | Discharge status: Home Health | Payer: Medicare Other | Attending: Internal Medicine | Admitting: Internal Medicine

## 2021-04-19 ENCOUNTER — Other Ambulatory Visit: Payer: Self-pay

## 2021-04-19 ENCOUNTER — Ambulatory Visit: Payer: Medicare Other | Admitting: Family Medicine

## 2021-04-19 ENCOUNTER — Emergency Department (HOSPITAL_COMMUNITY): Payer: Medicare Other

## 2021-04-19 DIAGNOSIS — J9621 Acute and chronic respiratory failure with hypoxia: Secondary | ICD-10-CM | POA: Diagnosis not present

## 2021-04-19 DIAGNOSIS — I272 Pulmonary hypertension, unspecified: Secondary | ICD-10-CM | POA: Diagnosis present

## 2021-04-19 DIAGNOSIS — Z882 Allergy status to sulfonamides status: Secondary | ICD-10-CM

## 2021-04-19 DIAGNOSIS — Z9841 Cataract extraction status, right eye: Secondary | ICD-10-CM

## 2021-04-19 DIAGNOSIS — Z7982 Long term (current) use of aspirin: Secondary | ICD-10-CM | POA: Diagnosis not present

## 2021-04-19 DIAGNOSIS — K219 Gastro-esophageal reflux disease without esophagitis: Secondary | ICD-10-CM | POA: Diagnosis present

## 2021-04-19 DIAGNOSIS — Z833 Family history of diabetes mellitus: Secondary | ICD-10-CM

## 2021-04-19 DIAGNOSIS — R001 Bradycardia, unspecified: Secondary | ICD-10-CM | POA: Diagnosis not present

## 2021-04-19 DIAGNOSIS — J9611 Chronic respiratory failure with hypoxia: Secondary | ICD-10-CM | POA: Diagnosis not present

## 2021-04-19 DIAGNOSIS — I5032 Chronic diastolic (congestive) heart failure: Secondary | ICD-10-CM | POA: Diagnosis not present

## 2021-04-19 DIAGNOSIS — R404 Transient alteration of awareness: Secondary | ICD-10-CM | POA: Diagnosis not present

## 2021-04-19 DIAGNOSIS — Z20822 Contact with and (suspected) exposure to covid-19: Secondary | ICD-10-CM | POA: Diagnosis not present

## 2021-04-19 DIAGNOSIS — K760 Fatty (change of) liver, not elsewhere classified: Secondary | ICD-10-CM | POA: Diagnosis not present

## 2021-04-19 DIAGNOSIS — I5030 Unspecified diastolic (congestive) heart failure: Secondary | ICD-10-CM | POA: Diagnosis present

## 2021-04-19 DIAGNOSIS — E785 Hyperlipidemia, unspecified: Secondary | ICD-10-CM | POA: Diagnosis present

## 2021-04-19 DIAGNOSIS — G8929 Other chronic pain: Secondary | ICD-10-CM | POA: Diagnosis not present

## 2021-04-19 DIAGNOSIS — D638 Anemia in other chronic diseases classified elsewhere: Secondary | ICD-10-CM | POA: Diagnosis present

## 2021-04-19 DIAGNOSIS — I252 Old myocardial infarction: Secondary | ICD-10-CM

## 2021-04-19 DIAGNOSIS — I5033 Acute on chronic diastolic (congestive) heart failure: Secondary | ICD-10-CM

## 2021-04-19 DIAGNOSIS — G4733 Obstructive sleep apnea (adult) (pediatric): Secondary | ICD-10-CM | POA: Diagnosis not present

## 2021-04-19 DIAGNOSIS — J9622 Acute and chronic respiratory failure with hypercapnia: Principal | ICD-10-CM | POA: Diagnosis present

## 2021-04-19 DIAGNOSIS — Z9119 Patient's noncompliance with other medical treatment and regimen: Secondary | ICD-10-CM

## 2021-04-19 DIAGNOSIS — Z823 Family history of stroke: Secondary | ICD-10-CM

## 2021-04-19 DIAGNOSIS — D649 Anemia, unspecified: Secondary | ICD-10-CM

## 2021-04-19 DIAGNOSIS — Z79899 Other long term (current) drug therapy: Secondary | ICD-10-CM

## 2021-04-19 DIAGNOSIS — R630 Anorexia: Secondary | ICD-10-CM | POA: Diagnosis present

## 2021-04-19 DIAGNOSIS — R0902 Hypoxemia: Secondary | ICD-10-CM | POA: Diagnosis not present

## 2021-04-19 DIAGNOSIS — Z7189 Other specified counseling: Secondary | ICD-10-CM | POA: Diagnosis not present

## 2021-04-19 DIAGNOSIS — I69391 Dysphagia following cerebral infarction: Secondary | ICD-10-CM | POA: Diagnosis not present

## 2021-04-19 DIAGNOSIS — E662 Morbid (severe) obesity with alveolar hypoventilation: Secondary | ICD-10-CM | POA: Diagnosis present

## 2021-04-19 DIAGNOSIS — I10 Essential (primary) hypertension: Secondary | ICD-10-CM | POA: Diagnosis present

## 2021-04-19 DIAGNOSIS — E871 Hypo-osmolality and hyponatremia: Secondary | ICD-10-CM | POA: Diagnosis not present

## 2021-04-19 DIAGNOSIS — Z8249 Family history of ischemic heart disease and other diseases of the circulatory system: Secondary | ICD-10-CM

## 2021-04-19 DIAGNOSIS — I110 Hypertensive heart disease with heart failure: Secondary | ICD-10-CM | POA: Diagnosis not present

## 2021-04-19 DIAGNOSIS — Z79890 Hormone replacement therapy (postmenopausal): Secondary | ICD-10-CM

## 2021-04-19 DIAGNOSIS — E039 Hypothyroidism, unspecified: Secondary | ICD-10-CM | POA: Diagnosis present

## 2021-04-19 DIAGNOSIS — I2510 Atherosclerotic heart disease of native coronary artery without angina pectoris: Secondary | ICD-10-CM | POA: Diagnosis present

## 2021-04-19 DIAGNOSIS — Z743 Need for continuous supervision: Secondary | ICD-10-CM | POA: Diagnosis not present

## 2021-04-19 DIAGNOSIS — R1310 Dysphagia, unspecified: Secondary | ICD-10-CM | POA: Diagnosis present

## 2021-04-19 DIAGNOSIS — I2721 Secondary pulmonary arterial hypertension: Secondary | ICD-10-CM | POA: Diagnosis present

## 2021-04-19 DIAGNOSIS — G929 Unspecified toxic encephalopathy: Secondary | ICD-10-CM | POA: Diagnosis present

## 2021-04-19 DIAGNOSIS — R0602 Shortness of breath: Secondary | ICD-10-CM | POA: Diagnosis not present

## 2021-04-19 DIAGNOSIS — M545 Low back pain, unspecified: Secondary | ICD-10-CM | POA: Diagnosis not present

## 2021-04-19 DIAGNOSIS — Z515 Encounter for palliative care: Secondary | ICD-10-CM | POA: Diagnosis not present

## 2021-04-19 DIAGNOSIS — R519 Headache, unspecified: Secondary | ICD-10-CM | POA: Diagnosis not present

## 2021-04-19 DIAGNOSIS — Z9842 Cataract extraction status, left eye: Secondary | ICD-10-CM

## 2021-04-19 DIAGNOSIS — K5900 Constipation, unspecified: Secondary | ICD-10-CM | POA: Diagnosis not present

## 2021-04-19 DIAGNOSIS — E873 Alkalosis: Secondary | ICD-10-CM | POA: Diagnosis not present

## 2021-04-19 DIAGNOSIS — Z9981 Dependence on supplemental oxygen: Secondary | ICD-10-CM | POA: Diagnosis not present

## 2021-04-19 DIAGNOSIS — G9341 Metabolic encephalopathy: Secondary | ICD-10-CM | POA: Diagnosis not present

## 2021-04-19 DIAGNOSIS — Z6830 Body mass index (BMI) 30.0-30.9, adult: Secondary | ICD-10-CM

## 2021-04-19 DIAGNOSIS — J9612 Chronic respiratory failure with hypercapnia: Secondary | ICD-10-CM | POA: Diagnosis present

## 2021-04-19 DIAGNOSIS — G928 Other toxic encephalopathy: Secondary | ICD-10-CM | POA: Diagnosis not present

## 2021-04-19 DIAGNOSIS — I499 Cardiac arrhythmia, unspecified: Secondary | ICD-10-CM | POA: Diagnosis not present

## 2021-04-19 DIAGNOSIS — G20 Parkinson's disease: Secondary | ICD-10-CM | POA: Diagnosis not present

## 2021-04-19 DIAGNOSIS — J9690 Respiratory failure, unspecified, unspecified whether with hypoxia or hypercapnia: Secondary | ICD-10-CM | POA: Diagnosis not present

## 2021-04-19 DIAGNOSIS — Z791 Long term (current) use of non-steroidal anti-inflammatories (NSAID): Secondary | ICD-10-CM

## 2021-04-19 DIAGNOSIS — R6889 Other general symptoms and signs: Secondary | ICD-10-CM | POA: Diagnosis not present

## 2021-04-19 DIAGNOSIS — Z720 Tobacco use: Secondary | ICD-10-CM

## 2021-04-19 DIAGNOSIS — Z9181 History of falling: Secondary | ICD-10-CM | POA: Diagnosis not present

## 2021-04-19 DIAGNOSIS — I69354 Hemiplegia and hemiparesis following cerebral infarction affecting left non-dominant side: Secondary | ICD-10-CM | POA: Diagnosis not present

## 2021-04-19 DIAGNOSIS — E870 Hyperosmolality and hypernatremia: Secondary | ICD-10-CM | POA: Diagnosis present

## 2021-04-19 DIAGNOSIS — T424X5A Adverse effect of benzodiazepines, initial encounter: Secondary | ICD-10-CM | POA: Diagnosis present

## 2021-04-19 DIAGNOSIS — F419 Anxiety disorder, unspecified: Secondary | ICD-10-CM | POA: Diagnosis present

## 2021-04-19 DIAGNOSIS — Z961 Presence of intraocular lens: Secondary | ICD-10-CM | POA: Diagnosis present

## 2021-04-19 DIAGNOSIS — F32A Depression, unspecified: Secondary | ICD-10-CM | POA: Diagnosis not present

## 2021-04-19 DIAGNOSIS — Z91010 Allergy to peanuts: Secondary | ICD-10-CM

## 2021-04-19 DIAGNOSIS — Z888 Allergy status to other drugs, medicaments and biological substances status: Secondary | ICD-10-CM

## 2021-04-19 LAB — CBC WITH DIFFERENTIAL/PLATELET
Abs Immature Granulocytes: 0.02 10*3/uL (ref 0.00–0.07)
Basophils Absolute: 0 10*3/uL (ref 0.0–0.1)
Basophils Relative: 0 %
Eosinophils Absolute: 0 10*3/uL (ref 0.0–0.5)
Eosinophils Relative: 1 %
HCT: 26.7 % — ABNORMAL LOW (ref 36.0–46.0)
Hemoglobin: 8.1 g/dL — ABNORMAL LOW (ref 12.0–15.0)
Immature Granulocytes: 0 %
Lymphocytes Relative: 17 %
Lymphs Abs: 1 10*3/uL (ref 0.7–4.0)
MCH: 28.2 pg (ref 26.0–34.0)
MCHC: 30.3 g/dL (ref 30.0–36.0)
MCV: 93 fL (ref 80.0–100.0)
Monocytes Absolute: 0.4 10*3/uL (ref 0.1–1.0)
Monocytes Relative: 7 %
Neutro Abs: 4.2 10*3/uL (ref 1.7–7.7)
Neutrophils Relative %: 75 %
Platelets: 198 10*3/uL (ref 150–400)
RBC: 2.87 MIL/uL — ABNORMAL LOW (ref 3.87–5.11)
RDW: 14.1 % (ref 11.5–15.5)
WBC: 5.6 10*3/uL (ref 4.0–10.5)
nRBC: 0.9 % — ABNORMAL HIGH (ref 0.0–0.2)

## 2021-04-19 LAB — URINALYSIS, ROUTINE W REFLEX MICROSCOPIC
Bacteria, UA: NONE SEEN
Bilirubin Urine: NEGATIVE
Glucose, UA: NEGATIVE mg/dL
Hgb urine dipstick: NEGATIVE
Ketones, ur: NEGATIVE mg/dL
Nitrite: NEGATIVE
Protein, ur: 100 mg/dL — AB
Specific Gravity, Urine: 1.017 (ref 1.005–1.030)
pH: 5 (ref 5.0–8.0)

## 2021-04-19 LAB — BASIC METABOLIC PANEL
Anion gap: 6 (ref 5–15)
BUN: 18 mg/dL (ref 8–23)
CO2: 39 mmol/L — ABNORMAL HIGH (ref 22–32)
Calcium: 9.7 mg/dL (ref 8.9–10.3)
Chloride: 80 mmol/L — ABNORMAL LOW (ref 98–111)
Creatinine, Ser: 0.57 mg/dL (ref 0.44–1.00)
GFR, Estimated: >60 mL/min (ref >60)
Glucose, Bld: 107 mg/dL — ABNORMAL HIGH (ref 70–99)
Potassium: 5.2 mmol/L — ABNORMAL HIGH (ref 3.5–5.1)
Sodium: 125 mmol/L — ABNORMAL LOW (ref 135–145)

## 2021-04-19 LAB — SODIUM, URINE, RANDOM: Sodium, Ur: <10 mmol/L

## 2021-04-19 LAB — HEPATIC FUNCTION PANEL
ALT: 11 U/L (ref 0–44)
AST: 22 U/L (ref 15–41)
Albumin: 2.8 g/dL — ABNORMAL LOW (ref 3.5–5.0)
Alkaline Phosphatase: 69 U/L (ref 38–126)
Bilirubin, Direct: 0.1 mg/dL (ref 0.0–0.2)
Indirect Bilirubin: 0.3 mg/dL (ref 0.3–0.9)
Total Bilirubin: 0.4 mg/dL (ref 0.3–1.2)
Total Protein: 6.5 g/dL (ref 6.5–8.1)

## 2021-04-19 LAB — BRAIN NATRIURETIC PEPTIDE: B Natriuretic Peptide: 578.7 pg/mL — ABNORMAL HIGH (ref 0.0–100.0)

## 2021-04-19 LAB — CREATININE, URINE, RANDOM: Creatinine, Urine: 115.9 mg/dL

## 2021-04-19 LAB — TSH: TSH: 1.759 u[IU]/mL (ref 0.350–4.500)

## 2021-04-19 MED ORDER — BOOST PO LIQD
237.0000 mL | Freq: Two times a day (BID) | ORAL | Status: DC
  Administered 2021-04-20 – 2021-04-24 (×9): 237 mL via ORAL
  Filled 2021-04-19 (×12): qty 237

## 2021-04-19 MED ORDER — POLYETHYLENE GLYCOL 3350 17 G PO PACK
17.0000 g | PACK | Freq: Every day | ORAL | Status: DC
  Administered 2021-04-20 – 2021-04-23 (×4): 17 g via ORAL
  Filled 2021-04-19 (×4): qty 1

## 2021-04-19 MED ORDER — ACETAMINOPHEN 650 MG RE SUPP: 650.0000 mg | Freq: Four times a day (QID) | RECTAL | Status: DC | PRN

## 2021-04-19 MED ORDER — MECLIZINE HCL 25 MG PO TABS
12.5000 mg | ORAL_TABLET | Freq: Three times a day (TID) | ORAL | Status: DC | PRN
  Administered 2021-04-23 (×2): 12.5 mg via ORAL
  Filled 2021-04-19 (×3): qty 1

## 2021-04-19 MED ORDER — ENOXAPARIN SODIUM 40 MG/0.4ML IJ SOSY
40.0000 mg | PREFILLED_SYRINGE | INTRAMUSCULAR | Status: DC
  Administered 2021-04-19 – 2021-04-24 (×6): 40 mg via SUBCUTANEOUS
  Filled 2021-04-19 (×6): qty 0.4

## 2021-04-19 MED ORDER — ALBUTEROL SULFATE (2.5 MG/3ML) 0.083% IN NEBU: 2.5000 mg | INHALATION_SOLUTION | RESPIRATORY_TRACT | Status: DC | PRN

## 2021-04-19 MED ORDER — ACETAMINOPHEN 325 MG PO TABS
650.0000 mg | ORAL_TABLET | Freq: Once | ORAL | Status: AC
  Administered 2021-04-19: 650 mg via ORAL
  Filled 2021-04-19: qty 2

## 2021-04-19 MED ORDER — GUAIFENESIN ER 600 MG PO TB12: 600.0000 mg | ORAL_TABLET | Freq: Two times a day (BID) | ORAL | Status: DC | PRN

## 2021-04-19 MED ORDER — POLYETHYLENE GLYCOL 3350 17 GM/SCOOP PO POWD
17.0000 g | Freq: Every morning | ORAL | Status: DC
  Filled 2021-04-19: qty 255

## 2021-04-19 MED ORDER — PANTOPRAZOLE SODIUM 40 MG PO TBEC
40.0000 mg | DELAYED_RELEASE_TABLET | Freq: Every day | ORAL | Status: DC
  Administered 2021-04-20 – 2021-04-24 (×5): 40 mg via ORAL
  Filled 2021-04-19 (×5): qty 1

## 2021-04-19 MED ORDER — LORATADINE 10 MG PO TABS
10.0000 mg | ORAL_TABLET | Freq: Every day | ORAL | Status: DC
  Administered 2021-04-20 – 2021-04-24 (×5): 10 mg via ORAL
  Filled 2021-04-19 (×5): qty 1

## 2021-04-19 MED ORDER — SODIUM CHLORIDE 1 G PO TABS
2.0000 g | ORAL_TABLET | Freq: Two times a day (BID) | ORAL | Status: DC
  Administered 2021-04-19 – 2021-04-24 (×10): 2 g via ORAL
  Filled 2021-04-19 (×11): qty 2

## 2021-04-19 MED ORDER — SODIUM CHLORIDE 0.9% FLUSH
3.0000 mL | Freq: Two times a day (BID) | INTRAVENOUS | Status: DC
  Administered 2021-04-20 – 2021-04-24 (×8): 3 mL via INTRAVENOUS

## 2021-04-19 MED ORDER — ASPIRIN EC 81 MG PO TBEC
81.0000 mg | DELAYED_RELEASE_TABLET | Freq: Every morning | ORAL | Status: DC
  Administered 2021-04-21 – 2021-04-24 (×4): 81 mg via ORAL
  Filled 2021-04-19 (×4): qty 1

## 2021-04-19 MED ORDER — DEMECLOCYCLINE HCL 150 MG PO TABS
150.0000 mg | ORAL_TABLET | Freq: Two times a day (BID) | ORAL | Status: DC
  Administered 2021-04-19 – 2021-04-24 (×11): 150 mg via ORAL
  Filled 2021-04-19 (×12): qty 1

## 2021-04-19 MED ORDER — OXYBUTYNIN CHLORIDE 5 MG PO TABS
5.0000 mg | ORAL_TABLET | Freq: Every day | ORAL | Status: DC
  Administered 2021-04-19 – 2021-04-24 (×6): 5 mg via ORAL
  Filled 2021-04-19 (×7): qty 1

## 2021-04-19 MED ORDER — FLUTICASONE PROPIONATE 50 MCG/ACT NA SUSP
2.0000 | Freq: Every day | NASAL | Status: DC
  Administered 2021-04-20 – 2021-04-24 (×5): 2 via NASAL
  Filled 2021-04-19 (×2): qty 16

## 2021-04-19 MED ORDER — AMLODIPINE BESYLATE 10 MG PO TABS
10.0000 mg | ORAL_TABLET | Freq: Every morning | ORAL | Status: DC
  Administered 2021-04-21 – 2021-04-24 (×4): 10 mg via ORAL
  Filled 2021-04-19 (×4): qty 1

## 2021-04-19 MED ORDER — SENNA 8.6 MG PO TABS
1.0000 | ORAL_TABLET | Freq: Every day | ORAL | Status: DC
  Administered 2021-04-20 – 2021-04-23 (×4): 8.6 mg via ORAL
  Filled 2021-04-19 (×4): qty 1

## 2021-04-19 MED ORDER — FUROSEMIDE 20 MG PO TABS: 20.0000 mg | ORAL_TABLET | Freq: Two times a day (BID) | ORAL | Status: DC

## 2021-04-19 MED ORDER — TRAZODONE HCL 50 MG PO TABS: 25.0000 mg | ORAL_TABLET | Freq: Every evening | ORAL | Status: DC | PRN

## 2021-04-19 MED ORDER — ALPRAZOLAM 0.5 MG PO TABS
1.0000 mg | ORAL_TABLET | Freq: Two times a day (BID) | ORAL | Status: DC
  Administered 2021-04-19: 1 mg via ORAL
  Filled 2021-04-19: qty 4

## 2021-04-19 MED ORDER — ACETAMINOPHEN 325 MG PO TABS
650.0000 mg | ORAL_TABLET | Freq: Four times a day (QID) | ORAL | Status: DC | PRN
  Administered 2021-04-21: 650 mg via ORAL
  Filled 2021-04-19: qty 2

## 2021-04-19 MED ORDER — LEVOTHYROXINE SODIUM 50 MCG PO TABS
50.0000 ug | ORAL_TABLET | Freq: Every day | ORAL | Status: DC
  Administered 2021-04-21 – 2021-04-24 (×4): 50 ug via ORAL
  Filled 2021-04-19 (×4): qty 1

## 2021-04-19 MED ORDER — SODIUM CHLORIDE 0.9 % IV SOLN: 250.0000 mL | INTRAVENOUS | Status: DC | PRN

## 2021-04-19 MED ORDER — SODIUM CHLORIDE 0.9% FLUSH: 3.0000 mL | INTRAVENOUS | Status: DC | PRN

## 2021-04-19 MED ORDER — HYDRALAZINE HCL 10 MG PO TABS
10.0000 mg | ORAL_TABLET | Freq: Two times a day (BID) | ORAL | Status: DC
  Administered 2021-04-19 – 2021-04-22 (×6): 10 mg via ORAL
  Filled 2021-04-19 (×6): qty 1

## 2021-04-19 NOTE — ED Triage Notes (Signed)
Pt BIB [facility] for hypotension and bradycardia. Pt has hx of chf, saw pulmonologist yesterday. Nurse aide went to check on pt today and found vitals to be 80/50 and HR 40. Pt is on 1.5L O2 at baseline, NAD, aox4. SB at 45 for EMS, cbg 165. Pt had edema to bilateral upper extremities and feet.

## 2021-04-19 NOTE — ED Provider Notes (Signed)
  Physical Exam  BP (!) 138/49   Pulse (!) 50   Temp 97.6 F (36.4 C) (Oral)   Resp 14   Ht 5\' 2"  (1.575 m)   Wt 79.4 kg   SpO2 93%   BMI 32.01 kg/m   Physical Exam  ED Course/Procedures     Procedures  MDM  Tammy Fernandez will be admitted to the hospital for bradycardia, anemia, hyponatremia, and CHF exacerbation.        , MD 04/19/21 04/21/21

## 2021-04-19 NOTE — ED Notes (Signed)
Patient transported to X-ray 

## 2021-04-19 NOTE — ED Provider Notes (Addendum)
[facility]   CSN: [id number] Arrival date & time: 04/19/21  1417     History Chief Complaint  Patient presents with  . Bradycardia  . Hypotension    Tammy Fernandez is a 87 y.o. female with PMH of CVA, diastolic heart failure, hypothyroidism, chronic hyponatremia, external hemorrhoids, hyperlipidemia and hypertension who presents for evaluation of hypotension and bradycardia.  Per son, patient was seen by her pulmonologist yesterday and was found to have hyponatremia of 125 and elevated CO2 of 44.  Patient was instructed to reduce home oxygen levels from 2 L to 1.5 L and follow-up with PCP.  This morning, home health physical therapy noticed that patient was hypertensive to BP in the 80s/40s and a heart rate in the 40s.  Patient was transported via EMS to the ED for further evaluation.  Per son, patient was diagnosed with sinusitis a month ago and finished antibiotics about a week ago. Since then patient has had generalized weakness and has been less mobile. Patient endorsed occasional heartburn, loss of appetite, shortness of breath, congestion, dizziness, leg swelling and urinary incontinence but denies any chest pain, fever, abdominal pain, palpitations or headaches.     Past Medical History:  Diagnosis Date  . Anxiety   . Chronic lower back pain   . Constipation    h/o fecal disimpaction on 2017  . CVA (cerebral vascular accident) (HCC) 2015   neg workup, MRI small acute lacunar infarcts. ASA only  . Depression   . Diastolic CHF, chronic (HCC)    grade I, most recent echo Jan 2019  . Fatty liver   . GERD (gastroesophageal reflux disease)   . Hyperlipidemia   . Hypertension   . Hyponatremia    multiple episodes with encephalopathy, renal thinks SIADH  . Hypothyroidism, unspecified   . Internal hemorrhoid   . NSTEMI (non-ST elevated myocardial infarction) (HCC) 08/28/2016  . Palpitations   . PFO with atrial septal aneurysm     TTE 2015  . Pneumonia 2016  . Severe pulmonary arterial systolic hypertension (HCC) 12/25/2017   per echo, on 1L oxgen via [place]    Patient Active Problem List   Diagnosis Date Noted  . Parkinson's disease (HCC) 09/22/2020  . Chronic sinusitis 06/01/2020  . OSA on CPAP 06/01/2020  . SIADH (syndrome of inappropriate ADH production) (HCC) 02/09/2020  . Pressure injury of skin 01/08/2019  . Hypochloremic alkalosis 12/27/2018  . Oxygen dependent 06/10/2018  . Chest pain, atypical 05/23/2018  . Chronic respiratory failure with hypoxia (HCC) 05/23/2018  . Long term prescription benzodiazepine use 04/23/2018  . Hyperlipidemia   . Chronic diastolic CHF (congestive heart failure) (HCC) 12/25/2017  . Severe pulmonary arterial systolic hypertension (HCC) 12/25/2017  . Essential hypertension 08/29/2016  . Hypothyroidism 08/29/2016  . Abnormal EKG   . Elevated troponin   . NSTEMI (non-ST elevated myocardial infarction) (HCC) 08/28/2016  . Decubitus ulcer of sacral region, stage 3 (HCC) 12/18/2015  . Klebsiella cystitis 12/16/2015  . Acute delirium 12/16/2015  . Aspiration pneumonia (HCC) 12/16/2015  . Leukocytosis   . Hypokalemia   . Encounter for central line placement   . Anxiety state   . Essential hypertension   . Physical deconditioning   . Altered mental status   . Chronic diarrhea   . Hyponatremia   . History of CVA (cerebrovascular accident) 01/19/2014  . Dysphagia, post-stroke 01/15/2014  . Bacteremia due to Staphylococcus 01/13/2014  . Left hemiplegia (HCC) 01/12/2014  . Hypertension  01/11/2014  . Acute hyponatremia 01/11/2014  . Hyperkalemia 01/11/2014  . Hematuria 01/11/2014  . E. coli UTI (urinary tract infection) 01/11/2014  . GERD (gastroesophageal reflux disease) 11/14/2011    Past Surgical History:  Procedure Laterality Date  . ABDOMINAL HYSTERECTOMY    . CATARACT EXTRACTION W/ INTRAOCULAR LENS  IMPLANT, BILATERAL Bilateral   . CESAREAN SECTION    . TEE  WITHOUT CARDIOVERSION N/A 01/19/2014   Procedure: TRANSESOPHAGEAL ECHOCARDIOGRAM (TEE);  Surgeon: Laurey Morale, MD;  Location: [facility] ENDOSCOPY;  Service: Cardiovascular;  Laterality: N/A;  [name]  . VAGINAL HYSTERECTOMY       OB History   No obstetric history on file.     Family History  Problem Relation Age of Onset  . Stroke Father   . Hypertension Father   . Colon cancer Cousin   . Colon cancer Other        Aunt  . Diabetes Other        aunt and cousins  . Heart disease Other        cousins  . Healthy Daughter   . Healthy Son     Social History   Tobacco Use  . Smoking status: Never Smoker  . Smokeless tobacco: Former Neurosurgeon    Types: Snuff  . Tobacco comment: "used snuff when I was 19"  Vaping Use  . Vaping Use: Never used  Substance Use Topics  . Alcohol use: No  . Drug use: No    Home Medications Prior to Admission medications   Medication Sig Start Date End Date Taking? Authorizing Provider  albuterol (PROVENTIL) (2.5 MG/3ML) 0.083% nebulizer solution USE VIA NEBULIZER EVERY 4 HOURS AS NEEDED FOR SHORTNESS OF BREATH OR WHEEZING 01/20/21   Just, Azalee Course, FNP  ALPRAZolam (XANAX) 1 MG tablet TAKE 1 TABLET(1 MG) BY MOUTH TWICE DAILY AS NEEDED FOR ANXIETY 02/08/21   Just, Azalee Course, FNP  amLODipine (NORVASC) 10 MG tablet TAKE 1 TABLET(10 MG) BY MOUTH DAILY 09/22/20   Just, Azalee Course, FNP  Aspirin 81 MG EC tablet Take 81 mg by mouth daily.    [provider]  azelastine (ASTELIN) 0.1 % nasal spray Place 1 spray into both nostrils 2 (two) times daily. Use in each nostril as directed Patient not taking: Reported on 04/18/2021 05/31/20   Glenford Bayley, NP  cetirizine (ZYRTEC) 10 MG tablet Take 10 mg by mouth daily.    [provider]  clotrimazole-betamethasone (LOTRISONE) cream Apply 1 application topically 2 (two) times daily. 09/22/20   Just, Azalee Course, FNP  demeclocycline (DECLOMYCIN) 300 MG tablet Take 0.5 tablets (150 mg total) by mouth 2  (two) times daily. 11/17/20   Just, Azalee Course, FNP  diclofenac sodium (VOLTAREN) 1 % GEL Apply 2 g topically 4 (four) times daily. 06/02/19   Doristine Bosworth, MD  famotidine (PEPCID) 20 MG tablet Take 1 tablet (20 mg total) by mouth 2 (two) times daily. 09/22/20   Just, Azalee Course, FNP  fluticasone (FLONASE) 50 MCG/ACT nasal spray SHAKE LIQUID AND USE 1 SPRAY IN EACH NOSTRIL TWICE DAILY 12/16/20   Just, Azalee Course, FNP  glycerin adult 2 g suppository Place 1 suppository rectally as needed for constipation. 01/27/19   Lezlie Lye, Meda Coffee, MD  guaiFENesin (MUCINEX) 600 MG 12 hr tablet Take 1 tablet (600 mg total) by mouth 2 (two) times daily as needed for to loosen phlegm. 08/19/20   Noni Saupe, MD  hydrALAZINE (APRESOLINE) 10 MG tablet TAKE  1 TABLET(10 MG) BY MOUTH TWICE DAILY 01/20/21   Just, Azalee Course, FNP  ipratropium (ATROVENT) 0.03 % nasal spray USE 2 SPRAYS IN EACH NOSTRIL TWICE DAILY Patient not taking: Reported on 04/18/2021 04/01/20   Doristine Bosworth, MD  levothyroxine (SYNTHROID) 50 MCG tablet TAKE 1 TABLET(50 MCG) BY MOUTH DAILY BEFORE BREAKFAST 09/22/20   Just, Azalee Course, FNP  meclizine (ANTIVERT) 12.5 MG tablet Take 1 tablet (12.5 mg total) by mouth 3 (three) times daily as needed for dizziness. 01/19/21   Just, Azalee Course, FNP  olopatadine (PATANOL) 0.1 % ophthalmic solution Place 1 drop into both eyes daily as needed for allergies. 02/08/21   Just, Azalee Course, FNP  omeprazole (PRILOSEC) 20 MG capsule Take 20 mg by mouth 2 (two) times daily.    [provider]  propranolol ER (INDERAL LA) 60 MG 24 hr capsule TAKE 1 CAPSULE(60 MG) BY MOUTH DAILY 09/22/20   Just, Azalee Course, FNP  Respiratory Therapy Supplies (NEBULIZER) DEVI Use with nebulized albuterol solution as needed for wheezing, cough or shortness of breath 08/17/20   Lezlie Lye, Meda Coffee, MD  senna (SENOKOT) 8.6 MG TABS tablet Take 1 tablet (8.6 mg total) by mouth daily. 09/22/20   Just, Azalee Course, FNP  sodium chloride 1 g tablet  Take 2 tablets (2 g total) by mouth 2 (two) times daily. 09/22/20 04/20/21  Just, Azalee Course, FNP    Allergies    Crestor [rosuvastatin calcium], Keflex [cephalexin], Peanut butter flavor, Sulfa antibiotics, Cephalexin, and Sulfa drugs cross reactors  Review of Systems   Review of Systems  Constitutional: Positive for activity change and appetite change. Negative for chills and fever.  HENT: Positive for congestion and sinus pressure.   Eyes: Negative for visual disturbance.  Respiratory: Positive for shortness of breath. Negative for cough and wheezing.   Gastrointestinal: Negative for abdominal pain, nausea and vomiting.  Genitourinary: Positive for frequency. Negative for difficulty urinating and hematuria.  Neurological: Positive for dizziness and weakness. Negative for numbness and headaches.    Physical Exam Updated Vital Signs BP (!) 132/48   Pulse (!) 46   Temp 97.6 F (36.4 C) (Oral)   Resp 13   Ht  (1.575 m)   Wt 79.4 kg   SpO2 100%   BMI 32.01 kg/m   Physical Exam Constitutional:      General: She is not in acute distress.    Appearance: She is ill-appearing.  HENT:     Head: Normocephalic and atraumatic.  Eyes:     Extraocular Movements: Extraocular movements intact.  Cardiovascular:     Rate and Rhythm: Regular rhythm. Bradycardia present.     Pulses: Normal pulses.     Heart sounds: Normal heart sounds.  Pulmonary:     Effort: Pulmonary effort is normal. No respiratory distress.     Breath sounds: Normal breath sounds. No wheezing.     Comments: Decreased breath sounds on the upper lung fields Abdominal:     General: Abdomen is flat. Bowel sounds are normal.     Palpations: Abdomen is soft.     Tenderness: There is no abdominal tenderness.  Musculoskeletal:        General: No tenderness. Normal range of motion.     Cervical back: Normal range of motion.     Comments: Trace bilateral lower extremity pitting edema up to the ankles.  Skin:     General: Skin is warm and dry.  Neurological:     Mental Status: She  is oriented to person, place, and time. Mental status is at baseline.     Sensory: No sensory deficit.     Motor: No weakness.  Psychiatric:        Mood and Affect: Mood normal.     ED Results / Procedures / Treatments   Labs (all labs ordered are listed, but only abnormal results are displayed) Labs Reviewed  CBC WITH DIFFERENTIAL/PLATELET - Abnormal; Notable for the following components:      Result Value   RBC 2.87 (*)    Hemoglobin 8.1 (*)    HCT 26.7 (*)    nRBC 0.9 (*)    All other components within normal limits  HEPATIC FUNCTION PANEL  URINALYSIS, ROUTINE W REFLEX MICROSCOPIC  TSH  BRAIN NATRIURETIC PEPTIDE  CREATININE, URINE, RANDOM  SODIUM, URINE, RANDOM    EKG EKG Interpretation  Date/Time:  Wednesday Apr 19 2021 14:25:33 EDT Ventricular Rate:  46 PR Interval:  163 QRS Duration: 87 QT Interval:  466 QTC Calculation: 408 R Axis:   48 Text Interpretation: Sinus bradycardia Nonspecific T abnormalities, anterior leads Confirmed by Tilden Fossa [phone]) on 04/19/2021 3:27:21 PM   Radiology DG Chest 2 View  Result Date: 04/19/2021 CLINICAL DATA:  Shortness of breath. EXAM: CHEST - 2 VIEW COMPARISON:  September 22, 2020 FINDINGS: Decreased lung volumes are seen which is likely secondary to the degree of patient inspiration. Mild to moderate severity areas of atelectasis and/or infiltrate are noted within the bilateral lung bases. There is no evidence of a pleural effusion or pneumothorax. Mild elevation of the right hemidiaphragm is seen. The heart size and mediastinal contours are within normal limits. There is marked severity calcification of the thoracic aorta. Degenerative changes are noted throughout the thoracic spine. IMPRESSION: Low lung volumes with mild to moderate severity bibasilar atelectasis and/or infiltrate. Electronically Signed   By: Aram Candela M.D.   On: 04/19/2021 16:22     Procedures Procedures   Medications Ordered in ED Medications - No data to display  ED Course  I have reviewed the triage vital signs and the nursing notes.  Pertinent labs & imaging results that were available during my care of the patient were reviewed by me and considered in my medical decision making (see chart for details).    MDM Rules/Calculators/A&P                          87 year old with a history of diastolic heart failure, hypothyroidism, chronic hyponatremia, external hemorrhoids, hyperlipidemia and hypertension who presents for evaluation of bradycardia and hypotension. Patient has associated shortness of breath, dizziness and lower extremity edema.  CBC significant for hemoglobin of 8.1 from 10.2 six months ago.  No signs of acute bleed.  Pending HFP, BNP, TSH and urine studies.   Chest x-ray showed mild to moderate bibasilar atelectasis and no lower infiltrate. Patient no respiratory distress. Satting well on room air. Can consider repeat echo to further evaluate for worsening heart failure.  Patient is currently on propranolol 60 mg and hydralazine 10 mg for hypertension.  Patient's beta-blocker likely cause of her bradycardia. Patient's hypertension is now resolved with BP in the 120s-130s/40s-50s.  Can consider admission for further evaluation and management of her presenting symptoms and complaints.  Patient signed out to oncoming provider Dr. Delford Field  Final Clinical Impression(s) / ED Diagnoses Final diagnoses:  SOB (shortness of breath)    Rx / DC Orders ED Discharge Orders    None  Steffanie Rainwater, MD 04/19/21 1721    Steffanie Rainwater, MD 04/19/21 Lizbeth Bark    Tilden Fossa, MD 04/20/21 [phone]

## 2021-04-19 NOTE — H&P (Signed)
History and Physical    COURTNEI RUDDELL OIZ:[id number] DOB: 13-Jan-1924 DOA: 04/19/2021  PCP: Glenford Bayley, NP (Confirm with patient/family/NH records and if not entered, this has to be entered at [facility] point of entry) Patient coming from: home  I have personally briefly reviewed patient's old medical records in [facility]  Chief Complaint: hypotension, bradycardia  HPI: AUSTRALIA DROLL is a 87 y.o. female with medical history significant of CVA, diastolic heart failure, hypothyroidism, chronic hyponatremia, external hemorrhoids, hyperlipidemia and hypertension who presents for evaluation of hypotension and bradycardia.  Per son, patient was seen by her pulmonologist yesterday and was found to have hyponatremia of 125 and elevated CO2 of 44.  Patient was instructed to reduce home oxygen levels from 2 L to 1.5 L and follow-up with PCP.  This morning, home health physical therapy noticed that patient was hypertensive to BP in the 80s/40s and a heart rate in the 40s.  Patient was transported via EMS to the ED for further evaluation.  Per son, patient was diagnosed with sinusitis a month ago and finished antibiotics about a week ago. Since then patient has had generalized weakness and has been less mobile. Patient endorsed occasional heartburn, loss of appetite, shortness of breath, congestion, dizziness, leg swelling and urinary incontinence but denies any chest pain, fever, abdominal pain, palpitations or headaches  ED Course: T97.6  140/55  HR 46  R 16 BMI 32. ED-MD exam notable for brtadycardia, trace bilateral LE edema, decreased BS. Lab BNP 578  Hgb 8.1, WBC 5.6  75/17/7, BMet 04/18/21 with Na 125 glucose 200. EKG sinus brady. Head CT negative for acute changes. TRH called to admit to observation for symptomatic bradycardia with intermittent hypotension.  Review of Systems: As per HPI otherwise 10 point review of systems negative.    Past Medical History:  Diagnosis Date  . Anxiety   .  Chronic lower back pain   . Constipation    h/o fecal disimpaction on 2017  . CVA (cerebral vascular accident) (HCC) 2015   neg workup, MRI small acute lacunar infarcts. ASA only  . Depression   . Diastolic CHF, chronic (HCC)    grade I, most recent echo Jan 2019  . Fatty liver   . GERD (gastroesophageal reflux disease)   . Hyperlipidemia   . Hypertension   . Hyponatremia    multiple episodes with encephalopathy, renal thinks SIADH  . Hypothyroidism, unspecified   . Internal hemorrhoid   . NSTEMI (non-ST elevated myocardial infarction) (HCC) 08/28/2016  . Palpitations   . PFO with atrial septal aneurysm    TTE 2015  . Pneumonia 2016  . Severe pulmonary arterial systolic hypertension (HCC) 12/25/2017   per echo, on 1L oxgen via [place]    Past Surgical History:  Procedure Laterality Date  . ABDOMINAL HYSTERECTOMY    . CATARACT EXTRACTION W/ INTRAOCULAR LENS  IMPLANT, BILATERAL Bilateral   . CESAREAN SECTION    . TEE WITHOUT CARDIOVERSION N/A 01/19/2014   Procedure: TRANSESOPHAGEAL ECHOCARDIOGRAM (TEE);  Surgeon: Laurey Morale, MD;  Location: [facility] ENDOSCOPY;  Service: Cardiovascular;  Laterality: N/A;  [name]  . VAGINAL HYSTERECTOMY     Soc Hx - widowed. Has 7 children, innumberable grands and great-grands. She lives at home, children are attentive. SHe has been followed by palliative care and family is considering hospice care.    reports that she has never smoked. She has quit using smokeless tobacco.  Her smokeless tobacco use included snuff. She reports that  she does not drink alcohol and does not use drugs.  Allergies  Allergen Reactions  . Crestor [Rosuvastatin Calcium] Other (See Comments)    Muscle Aches  . Keflex [Cephalexin] Other (See Comments)    Dizziness   . Peanut Butter Flavor Hives  . Sulfa Antibiotics Hives and Rash  . Cephalexin Other (See Comments)    Dizziness   . Peanut-Containing Drug Products Hives  . Rosuvastatin Other (See Comments)    Muscle  aches  . Sulfa Drugs Cross Reactors Hives and Rash    Family History  Problem Relation Age of Onset  . Stroke Father   . Hypertension Father   . Colon cancer Cousin   . Colon cancer Other        Aunt  . Diabetes Other        aunt and cousins  . Heart disease Other        cousins  . Healthy Daughter   . Healthy Son      Prior to Admission medications   Medication Sig Start Date End Date Taking? Authorizing Provider  albuterol (PROVENTIL) (2.5 MG/3ML) 0.083% nebulizer solution USE VIA NEBULIZER EVERY 4 HOURS AS NEEDED FOR SHORTNESS OF BREATH OR WHEEZING Patient taking differently: Take 2.5 mg by nebulization every 4 (four) hours as needed for shortness of breath or wheezing. 01/20/21  Yes Just, Azalee Course, FNP  ALPRAZolam (XANAX) 1 MG tablet TAKE 1 TABLET(1 MG) BY MOUTH TWICE DAILY AS NEEDED FOR ANXIETY Patient taking differently: Take 1 mg by mouth 2 (two) times daily. 02/08/21  Yes Just, Azalee Course, FNP  amLODipine (NORVASC) 10 MG tablet TAKE 1 TABLET(10 MG) BY MOUTH DAILY Patient taking differently: Take 10 mg by mouth in the morning. 09/22/20  Yes Just, Azalee Course, FNP  Aspirin 81 MG EC tablet Take 81 mg by mouth in the morning.   Yes [provider]  cetirizine (ZYRTEC) 10 MG tablet Take 10 mg by mouth daily.   Yes [provider]  clotrimazole-betamethasone (LOTRISONE) cream Apply 1 application topically 2 (two) times daily. Patient taking differently: Apply 1 application topically 2 (two) times daily as needed (to affected areas). 09/22/20  Yes Just, Azalee Course, FNP  demeclocycline (DECLOMYCIN) 300 MG tablet Take 0.5 tablets (150 mg total) by mouth 2 (two) times daily. 11/17/20  Yes Just, Azalee Course, FNP  famotidine (PEPCID) 20 MG tablet Take 1 tablet (20 mg total) by mouth 2 (two) times daily. 09/22/20  Yes Just, Azalee Course, FNP  fluticasone (FLONASE) 50 MCG/ACT nasal spray SHAKE LIQUID AND USE 1 SPRAY IN EACH NOSTRIL TWICE DAILY Patient taking differently: Place 2  sprays into both nostrils daily. 12/16/20  Yes Just, Azalee Course, FNP  guaiFENesin (MUCINEX) 600 MG 12 hr tablet Take 1 tablet (600 mg total) by mouth 2 (two) times daily as needed for to loosen phlegm. Patient taking differently: Take 600 mg by mouth See admin instructions. Take 600 mg by mouth in the morning and an additional 600 mg at bedtime as needed for chest congestion 08/19/20  Yes Lezlie Lye, Meda Coffee, MD  hydrALAZINE (APRESOLINE) 10 MG tablet TAKE 1 TABLET(10 MG) BY MOUTH TWICE DAILY Patient taking differently: Take 10 mg by mouth 2 (two) times daily. 01/20/21  Yes Just, Azalee Course, FNP  lactose free nutrition (BOOST) LIQD Take 237 mLs by mouth 2 (two) times daily between meals.   Yes [provider]  levothyroxine (SYNTHROID) 50 MCG tablet TAKE 1 TABLET(50 MCG) BY MOUTH DAILY BEFORE BREAKFAST  Patient taking differently: Take 50 mcg by mouth daily before breakfast. 09/22/20  Yes Just, Azalee Course, FNP  meclizine (ANTIVERT) 12.5 MG tablet Take 1 tablet (12.5 mg total) by mouth 3 (three) times daily as needed for dizziness. 01/19/21  Yes Just, Azalee Course, FNP  olopatadine (PATANOL) 0.1 % ophthalmic solution Place 1 drop into both eyes daily as needed for allergies. Patient taking differently: Place 1 drop into both eyes daily. 02/08/21  Yes Just, Azalee Course, FNP  omeprazole (PRILOSEC) 20 MG capsule Take 20 mg by mouth 2 (two) times daily.   Yes [provider]  oxybutynin (DITROPAN) 5 MG tablet Take 5 mg by mouth at bedtime. 01/10/21  Yes [provider]  OXYGEN Inhale 1.5-2 L/min into the lungs continuous.   Yes [provider]  polyethylene glycol powder (GLYCOLAX/MIRALAX) 17 GM/SCOOP powder Take 17 g by mouth in the morning.   Yes [provider]  Probiotic Product (ALIGN) 4 MG CAPS Take 4 mg by mouth in the morning.   Yes [provider]  propranolol ER (INDERAL LA) 60 MG 24 hr capsule TAKE 1 CAPSULE(60 MG) BY MOUTH DAILY Patient taking differently:  Take 60 mg by mouth in the morning. 09/22/20  Yes Just, Azalee Course, FNP  sodium chloride 1 g tablet Take 2 tablets (2 g total) by mouth 2 (two) times daily. 09/22/20 04/20/21 Yes Just, Azalee Course, FNP  azelastine (ASTELIN) 0.1 % nasal spray Place 1 spray into both nostrils 2 (two) times daily. Use in each nostril as directed Patient not taking: Reported on 04/19/2021 05/31/20   Glenford Bayley, NP  diclofenac sodium (VOLTAREN) 1 % GEL Apply 2 g topically 4 (four) times daily. Patient not taking: Reported on 04/19/2021 06/02/19   Doristine Bosworth, MD  glycerin adult 2 g suppository Place 1 suppository rectally as needed for constipation. Patient not taking: Reported on 04/19/2021 01/27/19   Lezlie Lye, Meda Coffee, MD  ipratropium (ATROVENT) 0.03 % nasal spray USE 2 SPRAYS IN [facility] NOSTRIL TWICE DAILY Patient not taking: Reported on 04/19/2021 04/01/20   Doristine Bosworth, MD  Respiratory Therapy Supplies (NEBULIZER) DEVI Use with nebulized albuterol solution as needed for wheezing, cough or shortness of breath 08/17/20   Lezlie Lye, Meda Coffee, MD  senna (SENOKOT) 8.6 MG TABS tablet Take 1 tablet (8.6 mg total) by mouth daily. Patient not taking: Reported on 04/19/2021 09/22/20   JustAzalee Course, FNP    Physical Exam: Vitals:   04/19/21 1930 04/19/21 1945 04/19/21 2000 04/19/21 2015  BP: (!) 143/61 (!) 140/55 (!) 143/57 (!) 141/55  Pulse: (!) 48 (!) 46 (!) 46 (!) 44  Resp: 13 16 12 13   Temp:      TempSrc:      SpO2: 91% 95% 93% 94%  Weight:      Height:         Vitals:   04/19/21 1930 04/19/21 1945 04/19/21 2000 04/19/21 2015  BP: (!) 143/61 (!) 140/55 (!) 143/57 (!) 141/55  Pulse: (!) 48 (!) 46 (!) 46 (!) 44  Resp: 13 16 12 13   Temp:      TempSrc:      SpO2: 91% 95% 93% 94%  Weight:      Height:       General:  Chronically ill appearing elderly woman, overweight, in no distress Eyes: PERRL, mild arcus senilis bilaterally, lids and conjunctivae normal ENMT: Mucous membranes are moist.  Posterior pharynx clear of any exudate or lesions. Poor  Dentition: missing several teeth, several carious appearing teeth.  Neck: normal, supple, no masses, no thyromegaly Respiratory: Decreased Breath sounds (poor inspiratory effort), no wheezing, basilar crackles R>L. Normal respiratory effort. No accessory muscle use.  Cardiovascular: Regular bradycardia, no murmurs / rubs / gallops. Notrace LE extremity edema. trace pedal pulses. No carotid bruits.  Abdomen: Obese, no tenderness, no masses palpated. No hepatosplenomegaly. Bowel sounds positive.  Musculoskeletal: no clubbing / cyanosis. No joint deformity upper and lower extremities. Good ROM, no contractures. Decreased muscle tone.  Skin: no rashes, lesions, ulcers. No induration Neurologic: CN 2-12 grossly intact. Sensation intact. Strength 4/5 in all 4.  Psychiatric: Normal judgment and lacks clear insight. Alert and oriented x 3. Normal mood.     Labs on Admission: I have personally reviewed following labs and imaging studies  CBC: Recent Labs  Lab 04/19/21 1531  WBC 5.6  NEUTROABS 4.2  HGB 8.1*  HCT 26.7*  MCV 93.0  PLT 198   Basic Metabolic Panel: Recent Labs  Lab 04/18/21 1238 04/19/21 1851  NA 125* 125*  K 4.4 5.2*  CL 81* 80*  CO2 44* 39*  GLUCOSE 200* 107*  BUN 17 18  CREATININE 0.55 0.57  CALCIUM 9.8 9.7   GFR: Estimated Creatinine Clearance: 42 mL/min (by C-G formula based on SCr of 0.57 mg/dL). Liver Function Tests: Recent Labs  Lab 04/19/21 1531  AST 22  ALT 11  ALKPHOS 69  BILITOT 0.4  PROT 6.5  ALBUMIN 2.8*   No results for input(s): LIPASE, AMYLASE in the last 168 hours. No results for input(s): AMMONIA in the last 168 hours. Coagulation Profile: No results for input(s): INR, PROTIME in the last 168 hours. Cardiac Enzymes: No results for input(s): CKTOTAL, CKMB, CKMBINDEX, TROPONINI in the last 168 hours. BNP (last 3 results) No results for input(s): PROBNP in the last 8760  hours. HbA1C: No results for input(s): HGBA1C in the last 72 hours. CBG: No results for input(s): GLUCAP in the last 168 hours. Lipid Profile: No results for input(s): CHOL, HDL, LDLCALC, TRIG, CHOLHDL, LDLDIRECT in the last 72 hours. Thyroid Function Tests: Recent Labs    04/19/21 1532  TSH 1.759   Anemia Panel: No results for input(s): VITAMINB12, FOLATE, FERRITIN, TIBC, IRON, RETICCTPCT in the last 72 hours. Urine analysis:    Component Value Date/Time   COLORURINE STRAW (A) 06/25/2018 1202   APPEARANCEUR Cloudy (A) 04/30/2019 0000   LABSPEC 1.006 06/25/2018 1202   PHURINE 7.0 06/25/2018 1202   GLUCOSEU Negative 04/30/2019 0000   HGBUR NEGATIVE 06/25/2018 1202   BILIRUBINUR Negative 04/30/2019 0000   KETONESUR NEGATIVE 06/25/2018 1202   PROTEINUR Trace 04/30/2019 0000   PROTEINUR NEGATIVE 06/25/2018 1202   UROBILINOGEN 0.2 01/11/2014 1951   NITRITE Negative 04/30/2019 0000   NITRITE NEGATIVE 06/25/2018 1202   LEUKOCYTESUR 3+ (A) 04/30/2019 0000    Radiological Exams on Admission: DG Chest 2 View  Result Date: 04/19/2021 CLINICAL DATA:  Shortness of breath. EXAM: CHEST - 2 VIEW COMPARISON:  September 22, 2020 FINDINGS: Decreased lung volumes are seen which is likely secondary to the degree of patient inspiration. Mild to moderate severity areas of atelectasis and/or infiltrate are noted within the bilateral lung bases. There is no evidence of a pleural effusion or pneumothorax. Mild elevation of the right hemidiaphragm is seen. The heart size and mediastinal contours are within normal limits. There is marked severity calcification of the thoracic aorta. Degenerative changes are noted throughout the thoracic spine. IMPRESSION: Low lung volumes with mild  to moderate severity bibasilar atelectasis and/or infiltrate. Electronically Signed   By: Aram Candela M.D.   On: 04/19/2021 16:22   CT Head Wo Contrast  Result Date: 04/19/2021 CLINICAL DATA:  Headache hypotension EXAM:  CT HEAD WITHOUT CONTRAST TECHNIQUE: Contiguous axial images were obtained from the base of the skull through the vertex without intravenous contrast. COMPARISON:  CT brain 01/01/2019 FINDINGS: Brain: No acute territorial infarction, hemorrhage, or intracranial mass. Patchy white matter hypodensity consistent with chronic small vessel ischemic change. Vascular: No hyperdense vessels. Vertebral and carotid vascular calcification Skull: Normal. Negative for fracture or focal lesion. Sinuses/Orbits: No acute finding. Other: None IMPRESSION: 1. No CT evidence for acute intracranial abnormality. 2. Mild atrophy and chronic small vessel ischemic change of the white matter Electronically Signed   By: Jasmine Pang M.D.   On: 04/19/2021 18:17    EKG: Independently reviewed. Sinus bradycardia w/o acute changes  Assessment/Plan Active Problems:   Bradycardia   Hypertension   Hyponatremia   Chronic diastolic CHF (congestive heart failure) (HCC)   Chronic respiratory failure with hypoxia (HCC)   GERD (gastroesophageal reflux disease)   Dysphagia, post-stroke   Hypothyroidism    1. Bradycardia - new onset. Hypotensive at home but normotensive in ED. No acute changes on EKG accept bradycardia. No new medications. Plan Tele observation  Hold inderal as possible causative agent. Continue other cardiac meds  May need cardiology if bradycardia persists-patient sees Dr. Nanetta Batty  2. HFpEF- chronic problem. Last ECHO 18/19 nl EF, grade 2 diastolic dysfxn.  Patient has experienced increased SOB, BNP mildly elevated and mild rales on exam Plan Lasix 20 mb bid x 2 days ` Recheck BNP in AM  Continue CCB and apresoline  ECHO  3. Hyponatremia - per record a chronic problem. Per son ranges 125-132. She does take salt tablets and is on fluid restriction. Last Na 125 Plan Continue salt tabs and fluid restriction  F/u Bmet in AM  4. Chronic respiratory failure - follows with Dr. Virgel Manifold. Has been on CPAP at  night for hypercarbia. Last CO2 44 on May 17th. Plan Continue home meds  Continue CPAP - RT consult  5. HTN- stable in ED Plan Continue home meds except BB  Cover for elevated BP as needed.   6. Hypothyroidism -  Plan Check TSH  Continue home meds  7. GERD- will continue PPI and d/c H2 blocker  8. Dysphagia - per son he has been preparing chopped and soft foods for her diet Plan Dysphagia 1 diet  9. EoL care - discussed with son, oldest daughter (on phone) and patient code status. Reviewed statistical probability of success and the aftermath of resuscitation. Referred them to [email or web address] Full code for now.    DVT prophylaxis: lovenox  Code Status: full code Family Communication: son present for exam, daughter on phone. Answered all questions.   Disposition Plan: Home when medically stable. Family is in touch with Hospice  Consults called: none  Admission status: obs-medical tele    Illene Regulus MD [facility] Pager [phone]  If 7PM-7AM, please contact night-coverage [email or web address] Password [facility]  04/19/2021, 9:28 PM

## 2021-04-20 ENCOUNTER — Observation Stay (HOSPITAL_COMMUNITY): Payer: Medicare Other

## 2021-04-20 DIAGNOSIS — I69391 Dysphagia following cerebral infarction: Secondary | ICD-10-CM | POA: Diagnosis not present

## 2021-04-20 DIAGNOSIS — K5900 Constipation, unspecified: Secondary | ICD-10-CM | POA: Diagnosis not present

## 2021-04-20 DIAGNOSIS — E873 Alkalosis: Secondary | ICD-10-CM | POA: Diagnosis present

## 2021-04-20 DIAGNOSIS — K219 Gastro-esophageal reflux disease without esophagitis: Secondary | ICD-10-CM | POA: Diagnosis not present

## 2021-04-20 DIAGNOSIS — R0902 Hypoxemia: Secondary | ICD-10-CM | POA: Diagnosis not present

## 2021-04-20 DIAGNOSIS — E785 Hyperlipidemia, unspecified: Secondary | ICD-10-CM | POA: Diagnosis present

## 2021-04-20 DIAGNOSIS — K760 Fatty (change of) liver, not elsewhere classified: Secondary | ICD-10-CM | POA: Diagnosis present

## 2021-04-20 DIAGNOSIS — I5033 Acute on chronic diastolic (congestive) heart failure: Secondary | ICD-10-CM | POA: Diagnosis not present

## 2021-04-20 DIAGNOSIS — I252 Old myocardial infarction: Secondary | ICD-10-CM | POA: Diagnosis not present

## 2021-04-20 DIAGNOSIS — I2721 Secondary pulmonary arterial hypertension: Secondary | ICD-10-CM | POA: Diagnosis present

## 2021-04-20 DIAGNOSIS — E662 Morbid (severe) obesity with alveolar hypoventilation: Secondary | ICD-10-CM | POA: Diagnosis present

## 2021-04-20 DIAGNOSIS — J9621 Acute and chronic respiratory failure with hypoxia: Secondary | ICD-10-CM | POA: Diagnosis present

## 2021-04-20 DIAGNOSIS — R0602 Shortness of breath: Secondary | ICD-10-CM | POA: Diagnosis not present

## 2021-04-20 DIAGNOSIS — Z743 Need for continuous supervision: Secondary | ICD-10-CM | POA: Diagnosis not present

## 2021-04-20 DIAGNOSIS — I10 Essential (primary) hypertension: Secondary | ICD-10-CM | POA: Diagnosis not present

## 2021-04-20 DIAGNOSIS — Z20822 Contact with and (suspected) exposure to covid-19: Secondary | ICD-10-CM | POA: Diagnosis present

## 2021-04-20 DIAGNOSIS — E039 Hypothyroidism, unspecified: Secondary | ICD-10-CM | POA: Diagnosis not present

## 2021-04-20 DIAGNOSIS — J9611 Chronic respiratory failure with hypoxia: Secondary | ICD-10-CM | POA: Diagnosis not present

## 2021-04-20 DIAGNOSIS — F419 Anxiety disorder, unspecified: Secondary | ICD-10-CM | POA: Diagnosis present

## 2021-04-20 DIAGNOSIS — R001 Bradycardia, unspecified: Secondary | ICD-10-CM | POA: Diagnosis not present

## 2021-04-20 DIAGNOSIS — G9341 Metabolic encephalopathy: Secondary | ICD-10-CM | POA: Diagnosis not present

## 2021-04-20 DIAGNOSIS — R6889 Other general symptoms and signs: Secondary | ICD-10-CM | POA: Diagnosis not present

## 2021-04-20 DIAGNOSIS — E871 Hypo-osmolality and hyponatremia: Secondary | ICD-10-CM | POA: Diagnosis not present

## 2021-04-20 DIAGNOSIS — Z7189 Other specified counseling: Secondary | ICD-10-CM | POA: Diagnosis not present

## 2021-04-20 DIAGNOSIS — I2510 Atherosclerotic heart disease of native coronary artery without angina pectoris: Secondary | ICD-10-CM | POA: Diagnosis present

## 2021-04-20 DIAGNOSIS — J90 Pleural effusion, not elsewhere classified: Secondary | ICD-10-CM | POA: Diagnosis not present

## 2021-04-20 DIAGNOSIS — I5030 Unspecified diastolic (congestive) heart failure: Secondary | ICD-10-CM | POA: Diagnosis present

## 2021-04-20 DIAGNOSIS — Z6830 Body mass index (BMI) 30.0-30.9, adult: Secondary | ICD-10-CM | POA: Diagnosis not present

## 2021-04-20 DIAGNOSIS — D649 Anemia, unspecified: Secondary | ICD-10-CM | POA: Diagnosis present

## 2021-04-20 DIAGNOSIS — G928 Other toxic encephalopathy: Secondary | ICD-10-CM | POA: Diagnosis present

## 2021-04-20 DIAGNOSIS — Z515 Encounter for palliative care: Secondary | ICD-10-CM | POA: Diagnosis not present

## 2021-04-20 DIAGNOSIS — J9622 Acute and chronic respiratory failure with hypercapnia: Secondary | ICD-10-CM | POA: Diagnosis present

## 2021-04-20 DIAGNOSIS — D638 Anemia in other chronic diseases classified elsewhere: Secondary | ICD-10-CM | POA: Diagnosis present

## 2021-04-20 DIAGNOSIS — I110 Hypertensive heart disease with heart failure: Secondary | ICD-10-CM | POA: Diagnosis present

## 2021-04-20 DIAGNOSIS — R1310 Dysphagia, unspecified: Secondary | ICD-10-CM | POA: Diagnosis present

## 2021-04-20 LAB — BLOOD GAS, ARTERIAL
Acid-Base Excess: 17.6 mmol/L — ABNORMAL HIGH (ref 0.0–2.0)
Bicarbonate: 42.2 mmol/L — ABNORMAL HIGH (ref 20.0–28.0)
Drawn by: 60057
FIO2: 50
O2 Saturation: 99.5 %
Patient temperature: 37
pCO2 arterial: 52 mmHg — ABNORMAL HIGH (ref 32.0–48.0)
pH, Arterial: 7.52 — ABNORMAL HIGH (ref 7.350–7.450)
pO2, Arterial: 148 mmHg — ABNORMAL HIGH (ref 83.0–108.0)

## 2021-04-20 LAB — CBC WITH DIFFERENTIAL/PLATELET
Abs Immature Granulocytes: 0.01 10*3/uL (ref 0.00–0.07)
Basophils Absolute: 0 10*3/uL (ref 0.0–0.1)
Basophils Relative: 0 %
Eosinophils Absolute: 0 10*3/uL (ref 0.0–0.5)
Eosinophils Relative: 1 %
HCT: 25.9 % — ABNORMAL LOW (ref 36.0–46.0)
Hemoglobin: 7.9 g/dL — ABNORMAL LOW (ref 12.0–15.0)
Immature Granulocytes: 0 %
Lymphocytes Relative: 26 %
Lymphs Abs: 1.1 10*3/uL (ref 0.7–4.0)
MCH: 28.2 pg (ref 26.0–34.0)
MCHC: 30.5 g/dL (ref 30.0–36.0)
MCV: 92.5 fL (ref 80.0–100.0)
Monocytes Absolute: 0.3 10*3/uL (ref 0.1–1.0)
Monocytes Relative: 7 %
Neutro Abs: 2.8 10*3/uL (ref 1.7–7.7)
Neutrophils Relative %: 66 %
Platelets: 224 10*3/uL (ref 150–400)
RBC: 2.8 MIL/uL — ABNORMAL LOW (ref 3.87–5.11)
RDW: 14.2 % (ref 11.5–15.5)
WBC: 4.2 10*3/uL (ref 4.0–10.5)
nRBC: 0.5 % — ABNORMAL HIGH (ref 0.0–0.2)

## 2021-04-20 LAB — I-STAT ARTERIAL BLOOD GAS, ED
Acid-Base Excess: 16 mmol/L — ABNORMAL HIGH (ref 0.0–2.0)
Bicarbonate: 45.6 mmol/L — ABNORMAL HIGH (ref 20.0–28.0)
Calcium, Ion: 1.34 mmol/L (ref 1.15–1.40)
HCT: 26 % — ABNORMAL LOW (ref 36.0–46.0)
Hemoglobin: 8.8 g/dL — ABNORMAL LOW (ref 12.0–15.0)
O2 Saturation: 97 %
Patient temperature: 92.9
Potassium: 4.8 mmol/L (ref 3.5–5.1)
Sodium: 124 mmol/L — ABNORMAL LOW (ref 135–145)
TCO2: 49 mmol/L — ABNORMAL HIGH (ref 22–32)
pCO2 arterial: 90.6 mmHg (ref 32.0–48.0)
pH, Arterial: 7.293 — ABNORMAL LOW (ref 7.350–7.450)
pO2, Arterial: 95 mmHg (ref 83.0–108.0)

## 2021-04-20 LAB — GLUCOSE, CAPILLARY: Glucose-Capillary: 92 mg/dL (ref 70–99)

## 2021-04-20 LAB — C-REACTIVE PROTEIN: CRP: 1.2 mg/dL — ABNORMAL HIGH (ref <1.0)

## 2021-04-20 LAB — BASIC METABOLIC PANEL
Anion gap: 7 (ref 5–15)
BUN: 21 mg/dL (ref 8–23)
CO2: 39 mmol/L — ABNORMAL HIGH (ref 22–32)
Calcium: 9.4 mg/dL (ref 8.9–10.3)
Chloride: 79 mmol/L — ABNORMAL LOW (ref 98–111)
Creatinine, Ser: 0.61 mg/dL (ref 0.44–1.00)
GFR, Estimated: >60 mL/min (ref >60)
Glucose, Bld: 89 mg/dL (ref 70–99)
Potassium: 5.6 mmol/L — ABNORMAL HIGH (ref 3.5–5.1)
Sodium: 125 mmol/L — ABNORMAL LOW (ref 135–145)

## 2021-04-20 LAB — SARS CORONAVIRUS 2 (TAT 6-24 HRS): SARS Coronavirus 2: NEGATIVE

## 2021-04-20 LAB — BRAIN NATRIURETIC PEPTIDE: B Natriuretic Peptide: 538.6 pg/mL — ABNORMAL HIGH (ref 0.0–100.0)

## 2021-04-20 MED ORDER — CHLORHEXIDINE GLUCONATE CLOTH 2 % EX PADS
6.0000 | MEDICATED_PAD | Freq: Every day | CUTANEOUS | Status: DC
  Administered 2021-04-20 – 2021-04-23 (×2): 6 via TOPICAL

## 2021-04-20 MED ORDER — FUROSEMIDE 10 MG/ML IJ SOLN
40.0000 mg | Freq: Once | INTRAMUSCULAR | Status: AC
  Administered 2021-04-20: 40 mg via INTRAVENOUS
  Filled 2021-04-20: qty 4

## 2021-04-20 MED ORDER — DOPAMINE-DEXTROSE 3.2-5 MG/ML-% IV SOLN
0.0000 ug/kg/min | INTRAVENOUS | Status: DC
  Administered 2021-04-20: 5 ug/kg/min via INTRAVENOUS
  Filled 2021-04-20: qty 250

## 2021-04-20 MED ORDER — NOREPINEPHRINE 4 MG/250ML-% IV SOLN: 0.0000 ug/min | INTRAVENOUS | Status: DC

## 2021-04-20 MED ORDER — PHENYLEPHRINE HCL-NACL 10-0.9 MG/250ML-% IV SOLN
25.0000 ug/min | INTRAVENOUS | Status: DC
  Administered 2021-04-20: 25 ug/min via INTRAVENOUS
  Filled 2021-04-20: qty 250

## 2021-04-20 MED ORDER — SODIUM CHLORIDE 0.9 % IV SOLN: 250.0000 mL | INTRAVENOUS | Status: DC

## 2021-04-20 NOTE — ED Notes (Signed)
RT at bedside.

## 2021-04-20 NOTE — Progress Notes (Signed)
Notified by RN that Temperature rectally is 91.7 F Reviewed chart.  Apply Bear hugger to warm patient.  Check CBC with differential. Check CRP level.

## 2021-04-20 NOTE — ED Notes (Signed)
CCT hospitalist in to assess pt.

## 2021-04-20 NOTE — ED Notes (Signed)
While in room to draw lab specimens. Pt HR decreased to 38, O2 sat went down into the 80's. Pt was placed on 6lpm via [place]. O2 sat again decreased to 70's. Pt placed on nonrebreather at 15lpm. RT notified and will be in to see pt. Paged Dr. Rachael Darby.

## 2021-04-20 NOTE — Progress Notes (Signed)
Transported patient from ED to 3M07 without event.

## 2021-04-20 NOTE — Progress Notes (Signed)
Rt removed pt from BiPAP and placed pt on 6L [place]. Pt tolerating well at this time with SVS. RN of pt made aware as well. RT will continue to monitor pt.

## 2021-04-20 NOTE — ED Notes (Signed)
Received call from Dr. Rachael Darby - provided verbal order to start bair hugger and repeat labs. He is placing orders at this time.

## 2021-04-20 NOTE — ED Notes (Signed)
RT at bedside to apply bipap.

## 2021-04-20 NOTE — Progress Notes (Signed)
   04/20/21 1751  Assess: MEWS Score  Resp (!) 29  O2 Device (S)  Bi-PAP  FiO2 (%) 50 %  Assess: MEWS Score  MEWS Temp 0  MEWS Systolic 0  MEWS Pulse 0  MEWS RR 2  MEWS LOC 0  MEWS Score 2  MEWS Score Color Yellow  Assess: if the MEWS score is Yellow or Red  Were vital signs taken at a resting state? Yes  Focused Assessment No change from prior assessment  Early Detection of Sepsis Score *See Row Information* Low  MEWS guidelines implemented *See Row Information* No, previously yellow, continue vital signs every 4 hours  Treat  MEWS Interventions Consulted Respiratory Therapy  Pain Scale 0-10  Pain Score 0  Notify: Charge Nurse/RN  Name of Charge Nurse/RN Notified Eduardo Osier, RN  Date Charge Nurse/RN Notified 04/20/21  Time Charge Nurse/RN Notified 1822  Document  Patient Outcome  (Remains lethargic, VSS. Placed back on Bipap.)

## 2021-04-20 NOTE — Progress Notes (Signed)
NAME:  Tammy Fernandez, MRN:  [id number], DOB:  1924/09/10, LOS: 0 ADMISSION DATE:  04/19/2021, CONSULTATION DATE:  04/20/2021 REFERRING MD:  Norins - TRH, CHIEF COMPLAINT:  Hypercarbic respiratory failure    History of Present Illness:  87 year old woman with increasing lethargy due to hypercarbic respiratory failure.   Followed by Dr Vassie Loll for chronic hypercarbic respiratory failure due to elevated hemidiaphragm - possible chest wall restriction. Associated chronic hyponatremia in the 125 range due to persistent compensatory metabolic alkalosis.   Admitted 5/18 to Hospitalist for hypotension and bradycardia. She had been experiencing weakness and decreased mobility.  Beta-blocker was held and patient was continued on diuretic.  Patient has become more lethargic, pCO2 elevated at 90. PCCM consulted.   Pertinent  Medical History   Past Medical History:  Diagnosis Date  . Anxiety   . Chronic lower back pain   . Constipation    h/o fecal disimpaction on 2017  . CVA (cerebral vascular accident) (HCC) 2015   neg workup, MRI small acute lacunar infarcts. ASA only  . Depression   . Diastolic CHF, chronic (HCC)    grade I, most recent echo Jan 2019  . Fatty liver   . GERD (gastroesophageal reflux disease)   . Hyperlipidemia   . Hypertension   . Hyponatremia    multiple episodes with encephalopathy, renal thinks SIADH  . Hypothyroidism, unspecified   . Internal hemorrhoid   . NSTEMI (non-ST elevated myocardial infarction) (HCC) 08/28/2016  . Palpitations   . PFO with atrial septal aneurysm    TTE 2015  . Pneumonia 2016  . Severe pulmonary arterial systolic hypertension (HCC) 12/25/2017   per echo, on 1L oxgen via [facility]   Significant Hospital Events: Including procedures, antibiotic start and stop dates in addition to other pertinent events   . 5/18 admission to [facility] . 5/19 PCCM consultation for worsening lethargy  Interim History / Subjective:  More responsive since starting  phenylephrine  Objective   Blood pressure 112/88, pulse (!) 43, temperature 97.6 F (36.4 C), resp. rate 14, height 5\' 2"  (1.575 m), weight 79.4 kg, SpO2 98 %.       No intake or output data in the 24 hours ending 04/20/21 0312 Filed Weights   04/19/21 1426  Weight: 79.4 kg    Examination: General: elderly woman - obese.  HENT: bipap mask with good fit Lungs: crackles at bases. Limited chest excursion.  Cardiovascular: JVP not visible, HS distant Abdomen: soft Extremities: mild edema. Neuro: lethargic but will open eyes to voice. No focal deficits. GU: catheter in place  Labs/imaging that I havepersonally reviewed  (right click and "Reselect all SmartList Selections" daily)  CXR shows persistently small lung volumes. Kyphosis on lateral Stable hyponatremia. Normal creatinine Chronic elevated CO2 and hypochloremia. Acute on chronic respiratory acidosis  Hyperkalemia from acidosis  Resolved Hospital Problem list   none  Assessment & Plan:  Critically ill due to acute on chronic hypercapnic respiratory failure due to possible chest wall restriction.  Bradycardia secondary to hypercarbia Decompensated HFpEF  Pulmonary hypertension.  Chronic hypernatremia. Acute metabolic encephalopathy Hypothyroidism  Plan:  Continuous NIV until pCO2 corrects. Son requests all be done including intubation. Follow ABG Diuresis  Consider tolvaptan if hyponatremia worsens. Dopamine to support HR and improve BP/ diuresis.  Consider palliative care consultation as no long-term solution  Best practice (right click and "Reselect all SmartList Selections" daily)  Diet:  NPO Pain/Anxiety/Delirium protocol (if indicated): No VAP protocol (if indicated): Not indicated DVT  prophylaxis: Subcutaneous Heparin GI prophylaxis: N/A Glucose control:  SSI No Central venous access:  N/A Arterial line:  N/A Foley:  Yes, and it is still needed Mobility:  bed rest  PT consulted: N/A Last date of  multidisciplinary goals of care discussion [Son updated at bedside] Code Status:  full code Disposition: ICU  Labs   CBC: Recent Labs  Lab 04/19/21 1531 04/20/21 0110 04/20/21 0139  WBC 5.6 4.2  --   NEUTROABS 4.2 2.8  --   HGB 8.1* 7.9* 8.8*  HCT 26.7* 25.9* 26.0*  MCV 93.0 92.5  --   PLT 198 224  --     Basic Metabolic Panel: Recent Labs  Lab 04/18/21 1238 04/19/21 1851 04/20/21 0139  NA 125* 125* 124*  K 4.4 5.2* 4.8  CL 81* 80*  --   CO2 44* 39*  --   GLUCOSE 200* 107*  --   BUN 17 18  --   CREATININE 0.55 0.57  --   CALCIUM 9.8 9.7  --    GFR: Estimated Creatinine Clearance: 42 mL/min (by C-G formula based on SCr of 0.57 mg/dL). Recent Labs  Lab 04/19/21 1531 04/20/21 0110  WBC 5.6 4.2    Liver Function Tests: Recent Labs  Lab 04/19/21 1531  AST 22  ALT 11  ALKPHOS 69  BILITOT 0.4  PROT 6.5  ALBUMIN 2.8*   No results for input(s): LIPASE, AMYLASE in the last 168 hours. No results for input(s): AMMONIA in the last 168 hours.  ABG    Component Value Date/Time   PHART 7.293 (L) 04/20/2021 0139   PCO2ART 90.6 (HH) 04/20/2021 0139   PO2ART 95 04/20/2021 0139   HCO3 45.6 (H) 04/20/2021 0139   TCO2 49 (H) 04/20/2021 0139   O2SAT 97.0 04/20/2021 0139     Coagulation Profile: No results for input(s): INR, PROTIME in the last 168 hours.  Cardiac Enzymes: No results for input(s): CKTOTAL, CKMB, CKMBINDEX, TROPONINI in the last 168 hours.  HbA1C: Hgb A1c MFr Bld  Date/Time Value Ref Range Status  09/22/2020 03:17 PM 5.4 4.8 - 5.6 % Final    Comment:             Prediabetes: 5.7 - 6.4          Diabetes: >6.4          Glycemic control for adults with diabetes: <7.0   10/31/2015 06:07 AM 7.2 (H) 4.8 - 5.6 % Final    Comment:    (NOTE)         Pre-diabetes: 5.7 - 6.4         Diabetes: >6.4         Glycemic control for adults with diabetes: <7.0     CBG: No results for input(s): GLUCAP in the last 168 hours.  Review of Systems:    Review of Systems  Unable to perform ROS: Critical illness   Past Medical History:  She,  has a past medical history of Anxiety, Chronic lower back pain, Constipation, CVA (cerebral vascular accident) (HCC) (2015), Depression, Diastolic CHF, chronic (HCC), Fatty liver, GERD (gastroesophageal reflux disease), Hyperlipidemia, Hypertension, Hyponatremia, Hypothyroidism, unspecified, Internal hemorrhoid, NSTEMI (non-ST elevated myocardial infarction) (HCC) (08/28/2016), Palpitations, PFO with atrial septal aneurysm, Pneumonia (2016), and Severe pulmonary arterial systolic hypertension (HCC) (12/25/2017).   Surgical History:   Past Surgical History:  Procedure Laterality Date  . ABDOMINAL HYSTERECTOMY    . CATARACT EXTRACTION W/ INTRAOCULAR LENS  IMPLANT, BILATERAL Bilateral   . CESAREAN SECTION    .  TEE WITHOUT CARDIOVERSION N/A 01/19/2014   Procedure: TRANSESOPHAGEAL ECHOCARDIOGRAM (TEE);  Surgeon: Laurey Morale, MD;  [facility];  Service: Cardiovascular;  Laterality: N/A;  [name]  . VAGINAL HYSTERECTOMY       Social History:   reports that she has never smoked. She has quit using smokeless tobacco.  Her smokeless tobacco use included snuff. She reports that she does not drink alcohol and does not use drugs.   Family History:  Her family history includes Colon cancer in her cousin and another family member; Diabetes in an other family member; Healthy in her daughter and son; Heart disease in an other family member; Hypertension in her father; Stroke in her father.   Allergies Allergies  Allergen Reactions  . Crestor [Rosuvastatin Calcium] Other (See Comments)    Muscle Aches  . Keflex [Cephalexin] Other (See Comments)    Dizziness   . Peanut Butter Flavor Hives  . Sulfa Antibiotics Hives and Rash  . Cephalexin Other (See Comments)    Dizziness   . Peanut-Containing Drug Products Hives  . Rosuvastatin Other (See Comments)    Muscle aches  . Sulfa Drugs Cross  Reactors Hives and Rash     Home Medications  Prior to Admission medications   Medication Sig Start Date End Date Taking? Authorizing Provider  albuterol (PROVENTIL) (2.5 MG/3ML) 0.083% nebulizer solution USE VIA NEBULIZER EVERY 4 HOURS AS NEEDED FOR SHORTNESS OF BREATH OR WHEEZING Patient taking differently: Take 2.5 mg by nebulization every 4 (four) hours as needed for shortness of breath or wheezing. 01/20/21  Yes Just, Azalee Course, FNP  ALPRAZolam (XANAX) 1 MG tablet TAKE 1 TABLET(1 MG) BY MOUTH TWICE DAILY AS NEEDED FOR ANXIETY Patient taking differently: Take 1 mg by mouth 2 (two) times daily. 02/08/21  Yes Just, Azalee Course, FNP  amLODipine (NORVASC) 10 MG tablet TAKE 1 TABLET(10 MG) BY MOUTH DAILY Patient taking differently: Take 10 mg by mouth in the morning. 09/22/20  Yes Just, Azalee Course, FNP  Aspirin 81 MG EC tablet Take 81 mg by mouth in the morning.   Yes [provider]  cetirizine (ZYRTEC) 10 MG tablet Take 10 mg by mouth daily.   Yes [provider]  clotrimazole-betamethasone (LOTRISONE) cream Apply 1 application topically 2 (two) times daily. Patient taking differently: Apply 1 application topically 2 (two) times daily as needed (to affected areas). 09/22/20  Yes Just, Azalee Course, FNP  demeclocycline (DECLOMYCIN) 300 MG tablet Take 0.5 tablets (150 mg total) by mouth 2 (two) times daily. 11/17/20  Yes Just, Azalee Course, FNP  famotidine (PEPCID) 20 MG tablet Take 1 tablet (20 mg total) by mouth 2 (two) times daily. 09/22/20  Yes Just, Azalee Course, FNP  fluticasone (FLONASE) 50 MCG/ACT nasal spray SHAKE LIQUID AND USE 1 SPRAY IN EACH NOSTRIL TWICE DAILY Patient taking differently: Place 2 sprays into both nostrils daily. 12/16/20  Yes Just, Azalee Course, FNP  guaiFENesin (MUCINEX) 600 MG 12 hr tablet Take 1 tablet (600 mg total) by mouth 2 (two) times daily as needed for to loosen phlegm. Patient taking differently: Take 600 mg by mouth See admin instructions. Take 600 mg by mouth  in the morning and an additional 600 mg at bedtime as needed for chest congestion 08/19/20  Yes Lezlie Lye, Meda Coffee, MD  hydrALAZINE (APRESOLINE) 10 MG tablet TAKE 1 TABLET(10 MG) BY MOUTH TWICE DAILY Patient taking differently: Take 10 mg by mouth 2 (two) times daily. 01/20/21  Yes Just, Azalee Course,  FNP  lactose free nutrition (BOOST) LIQD Take 237 mLs by mouth 2 (two) times daily between meals.   Yes [provider]  levothyroxine (SYNTHROID) 50 MCG tablet TAKE 1 TABLET(50 MCG) BY MOUTH DAILY BEFORE BREAKFAST Patient taking differently: Take 50 mcg by mouth daily before breakfast. 09/22/20  Yes Just, Azalee Course, FNP  meclizine (ANTIVERT) 12.5 MG tablet Take 1 tablet (12.5 mg total) by mouth 3 (three) times daily as needed for dizziness. 01/19/21  Yes Just, Azalee Course, FNP  olopatadine (PATANOL) 0.1 % ophthalmic solution Place 1 drop into both eyes daily as needed for allergies. Patient taking differently: Place 1 drop into both eyes daily. 02/08/21  Yes Just, Azalee Course, FNP  omeprazole (PRILOSEC) 20 MG capsule Take 20 mg by mouth 2 (two) times daily.   Yes [provider]  oxybutynin (DITROPAN) 5 MG tablet Take 5 mg by mouth at bedtime. 01/10/21  Yes [provider]  OXYGEN Inhale 1.5-2 L/min into the lungs continuous.   Yes [provider]  polyethylene glycol powder (GLYCOLAX/MIRALAX) 17 GM/SCOOP powder Take 17 g by mouth in the morning.   Yes [provider]  Probiotic Product (ALIGN) 4 MG CAPS Take 4 mg by mouth in the morning.   Yes [provider]  propranolol ER (INDERAL LA) 60 MG 24 hr capsule TAKE 1 CAPSULE(60 MG) BY MOUTH DAILY Patient taking differently: Take 60 mg by mouth in the morning. 09/22/20  Yes Just, Azalee Course, FNP  sodium chloride 1 g tablet Take 2 tablets (2 g total) by mouth 2 (two) times daily. 09/22/20 04/20/21 Yes Just, Azalee Course, FNP  azelastine (ASTELIN) 0.1 % nasal spray Place 1 spray into both nostrils 2 (two) times daily. Use in  each nostril as directed Patient not taking: Reported on 04/19/2021 05/31/20   Glenford Bayley, NP  diclofenac sodium (VOLTAREN) 1 % GEL Apply 2 g topically 4 (four) times daily. Patient not taking: Reported on 04/19/2021 06/02/19   Doristine Bosworth, MD  glycerin adult 2 g suppository Place 1 suppository rectally as needed for constipation. Patient not taking: Reported on 04/19/2021 01/27/19   Lezlie Lye, Meda Coffee, MD  ipratropium (ATROVENT) 0.03 % nasal spray USE 2 SPRAYS IN [facility] NOSTRIL TWICE DAILY Patient not taking: Reported on 04/19/2021 04/01/20   Doristine Bosworth, MD  Respiratory Therapy Supplies (NEBULIZER) DEVI Use with nebulized albuterol solution as needed for wheezing, cough or shortness of breath 08/17/20   Lezlie Lye, Meda Coffee, MD  senna (SENOKOT) 8.6 MG TABS tablet Take 1 tablet (8.6 mg total) by mouth daily. Patient not taking: Reported on 04/19/2021 09/22/20   Just, Azalee Course, FNP    CRITICAL CARE Performed by: Lynnell Catalan   Total critical care time: 50 minutes  Critical care time was exclusive of separately billable procedures and treating other patients.  Critical care was necessary to treat or prevent imminent or life-threatening deterioration.  Critical care was time spent personally by me on the following activities: development of treatment plan with patient and/or surrogate as well as nursing, discussions with consultants, evaluation of patient's response to treatment, examination of patient, obtaining history from patient or surrogate, ordering and performing treatments and interventions, ordering and review of laboratory studies, ordering and review of radiographic studies, pulse oximetry, re-evaluation of patient's condition and participation in multidisciplinary rounds.  Lynnell Catalan, MD [facility] ICU Physician [facility] [place] [facility]  Pager: [phone] Mobile: [phone] After hours: [phone].        04/20/2021,  3:08 AM

## 2021-04-20 NOTE — Progress Notes (Addendum)
NAME:  Tammy Fernandez, MRN:  [id number], DOB:  May 27, 1924, LOS: 0 ADMISSION DATE:  04/19/2021, CONSULTATION DATE:  04/20/2021 REFERRING MD:  Norins - TR[facility], CHIEF COMPLAINT:  Hypercarbic respiratory failure    History of Present Illness:  87 year old woman with increasing lethargy due to hypercarbic respiratory failure.   Followed by Dr Vassie Loll for chronic hypercarbic respiratory failure due to elevated hemidiaphragm - possible chest wall restriction. Associated chronic hyponatremia in the 125 range due to persistent compensatory metabolic alkalosis.   Admitted 5/18 to Hospitalist for hypotension and bradycardia. She had been experiencing weakness and decreased mobility.  Beta-blocker was held and patient was continued on diuretic.  Patient has become more lethargic, pCO2 elevated at 90. PCCM consulted.   Pertinent  Medical History   Past Medical History:  Diagnosis Date  . Anxiety   . Chronic lower back pain   . Constipation    h/o fecal disimpaction on 2017  . CVA (cerebral vascular accident) (HCC) 2015   neg workup, MRI small acute lacunar infarcts. ASA only  . Depression   . Diastolic CHF, chronic (HCC)    grade I, most recent echo Jan 2019  . Fatty liver   . GERD (gastroesophageal reflux disease)   . Hyperlipidemia   . Hypertension   . Hyponatremia    multiple episodes with encephalopathy, renal thinks SIADH  . Hypothyroidism, unspecified   . Internal hemorrhoid   . NSTEMI (non-ST elevated myocardial infarction) (HCC) 08/28/2016  . Palpitations   . PFO with atrial septal aneurysm    TTE 2015  . Pneumonia 2016  . Severe pulmonary arterial systolic hypertension (HCC) 12/25/2017   per echo, on 1L oxgen via [facility]   Significant Hospital Events: Including procedures, antibiotic start and stop dates in addition to other pertinent events   . 5/18 admission to [facility] . 5/19 PCCM consultation for worsening lethargy, ABG showed pco2 of 90. Started on bipap and pressors overnight. . 5/19  off pressors and BiPAP this morning, on HFNC   Interim History / Subjective:   Overnight on BiPAP and pressors. This morning seen on nasal canula. Alert and awake, responding appropriately to questions and following simple commands.  Objective   Blood pressure (!) 151/58, pulse 70, temperature (!) 97.3 F (36.3 C), temperature source Axillary, resp. rate 19, height 5\' 2"  (1.575 m), weight 76 kg, SpO2 100 %.    FiO2 (%):  [50 %] 50 %   Intake/Output Summary (Last 24 hours) at 04/20/2021 0905 Last data filed at 04/20/2021 0800 Gross per 24 hour  Intake 55.83 ml  Output 400 ml  Net -344.17 ml   Filed Weights   04/19/21 1426 04/20/21 0523  Weight: 79.4 kg 76 kg    Examination: General: elderly woman - obese.  HENT: antiicteric sclerae, moist membranes Lungs: crackles at bases, on nasal canula Cardiovascular: s1, s2, no murmurs rubs or gallops, RRR Abdomen: soft, nontender, bowel sounds present Extremities: no LE edema Neuro: lethargic,but easy to wake, follows commands, answers questions appropriately  GU: catheter in place  Labs/imaging that I havepersonally reviewed  (right click and "Reselect all SmartList Selections" daily)  CXR reviewed, ABG, CMP, CBC  Resolved Hospital Problem list   none  Assessment & Plan:   Acute on chronic hypercapnic respiratory failure Hx of chest wall restriction    Pulmonary hypertension    - Improved with BiPAP overnight    - on 3L HFNC now    - Will diurese with 40 mg IV  furosemide   Bradycardia secondary to hypercarbia Decompensated HFpEF  - Given phenylephrine last night and started on dopamine - Dopamine stopped this morning  - Pressures and HR improved - Continue home meds amlodipine and hydralazine  - Continue to hold propanolol  - Continue diuresis 40 mg IV furosemide    Chronic hyponatremia - Na 125, stable since admission - Consider tolvaptan if hyponatremia worsens   Acute metabolic encephalopathy - Likely due to  hypercarbia  - Much improved today - Continue to monitor - Avoid central acting agents   Hypothyroidism - Continue synthroid   Anxiety - Continue alprazolam 1 mg bid prn  Best practice (right click and "Reselect all SmartList Selections" daily)  Diet:  Oral Pain/Anxiety/Delirium protocol (if indicated): No VAP protocol (if indicated): Not indicated DVT prophylaxis: Subcutaneous Heparin GI prophylaxis: N/A Glucose control:  SSI No Central venous access:  N/A Arterial line:  N/A Foley:  Yes, and it is still needed Mobility:  bed rest  PT consulted: N/A Last date of multidisciplinary goals of care discussion [Son updated at bedside] Code Status:  full code Disposition: ICU  Labs   CBC: Recent Labs  Lab 04/19/21 1531 04/20/21 0110 04/20/21 0139  WBC 5.6 4.2  --   NEUTROABS 4.2 2.8  --   HGB 8.1* 7.9* 8.8*  HCT 26.7* 25.9* 26.0*  MCV 93.0 92.5  --   PLT 198 224  --     Basic Metabolic Panel: Recent Labs  Lab 04/18/21 1238 04/19/21 1851 04/20/21 0139 04/20/21 0215  NA 125* 125* 124* 125*  K 4.4 5.2* 4.8 5.6*  CL 81* 80*  --  79*  CO2 44* 39*  --  39*  GLUCOSE 200* 107*  --  89  BUN 17 18  --  21  CREATININE 0.55 0.57  --  0.61  CALCIUM 9.8 9.7  --  9.4   GFR: Estimated Creatinine Clearance: 41.1 mL/min (by C-G formula based on SCr of 0.61 mg/dL). Recent Labs  Lab 04/19/21 1531 04/20/21 0110  WBC 5.6 4.2    Liver Function Tests: Recent Labs  Lab 04/19/21 1531  AST 22  ALT 11  ALKPHOS 69  BILITOT 0.4  PROT 6.5  ALBUMIN 2.8*   No results for input(s): LIPASE, AMYLASE in the last 168 hours. No results for input(s): AMMONIA in the last 168 hours.  ABG    Component Value Date/Time   PHART 7.520 (H) 04/20/2021 0534   PCO2ART 52.0 (H) 04/20/2021 0534   PO2ART 148 (H) 04/20/2021 0534   HCO3 42.2 (H) 04/20/2021 0534   TCO2 49 (H) 04/20/2021 0139   O2SAT 99.5 04/20/2021 0534     Coagulation Profile: No results for input(s): INR, PROTIME in  the last 168 hours.  Cardiac Enzymes: No results for input(s): CKTOTAL, CKMB, CKMBINDEX, TROPONINI in the last 168 hours.  HbA1C: Hgb A1c MFr Bld  Date/Time Value Ref Range Status  09/22/2020 03:17 PM 5.4 4.8 - 5.6 % Final    Comment:             Prediabetes: 5.7 - 6.4          Diabetes: >6.4          Glycemic control for adults with diabetes: <7.0   10/31/2015 06:07 AM 7.2 (H) 4.8 - 5.6 % Final    Comment:    (NOTE)         Pre-diabetes: 5.7 - 6.4         Diabetes: >6.4  Glycemic control for adults with diabetes: <7.0     CBG: No results for input(s): GLUCAP in the last 168 hours.   Past Medical History:  She,  has a past medical history of Anxiety, Chronic lower back pain, Constipation, CVA (cerebral vascular accident) (HCC) (2015), Depression, Diastolic CHF, chronic (HCC), Fatty liver, GERD (gastroesophageal reflux disease), Hyperlipidemia, Hypertension, Hyponatremia, Hypothyroidism, unspecified, Internal hemorrhoid, NSTEMI (non-ST elevated myocardial infarction) (HCC) (08/28/2016), Palpitations, PFO with atrial septal aneurysm, Pneumonia (2016), and Severe pulmonary arterial systolic hypertension (HCC) (12/25/2017).   Surgical History:   Past Surgical History:  Procedure Laterality Date  . ABDOMINAL HYSTERECTOMY    . CATARACT EXTRACTION W/ INTRAOCULAR LENS  IMPLANT, BILATERAL Bilateral   . CESAREAN SECTION    . TEE WITHOUT CARDIOVERSION N/A 01/19/2014   Procedure: TRANSESOPHAGEAL ECHOCARDIOGRAM (TEE);  Surgeon: Laurey Moralealton S McLean, MD;  [facility];  Service: Cardiovascular;  Laterality: N/A;  [name]/ja  . VAGINAL HYSTERECTOMY       Social History:   reports that she has never smoked. She has quit using smokeless tobacco.  Her smokeless tobacco use included snuff. She reports that she does not drink alcohol and does not use drugs.   Family History:  Her family history includes Colon cancer in her cousin and another family member; Diabetes in an other  family member; Healthy in her daughter and son; Heart disease in an other family member; Hypertension in her father; Stroke in her father.   Allergies Allergies  Allergen Reactions  . Crestor [Rosuvastatin Calcium] Other (See Comments)    Muscle Aches  . Keflex [Cephalexin] Other (See Comments)    Dizziness   . Peanut Butter Flavor Hives  . Sulfa Antibiotics Hives and Rash  . Cephalexin Other (See Comments)    Dizziness   . Peanut-Containing Drug Products Hives  . Rosuvastatin Other (See Comments)    Muscle aches  . Sulfa Drugs Cross Reactors Hives and Rash     Home Medications  Prior to Admission medications   Medication Sig Start Date End Date Taking? Authorizing Provider  albuterol (PROVENTIL) (2.5 MG/3ML) 0.083% nebulizer solution USE 3ML VIA NEBULIZER EVERY 4 HOURS AS NEEDED FOR SHORTNESS OF BREATH OR WHEEZING Patient taking differently: Take 2.5 mg by nebulization every 4 (four) hours as needed for shortness of breath or wheezing. 01/20/21  Yes Just, Azalee CourseKelsea J, FNP  ALPRAZolam (XANAX) 1 MG tablet TAKE 1 TABLET(1 MG) BY MOUTH TWICE DAILY AS NEEDED FOR ANXIETY Patient taking differently: Take 1 mg by mouth 2 (two) times daily. 02/08/21  Yes Just, Azalee CourseKelsea J, FNP  amLODipine (NORVASC) 10 MG tablet TAKE 1 TABLET(10 MG) BY MOUTH DAILY Patient taking differently: Take 10 mg by mouth in the morning. 09/22/20  Yes Just, Azalee CourseKelsea J, FNP  Aspirin 81 MG EC tablet Take 81 mg by mouth in the morning.   Yes [provider]  cetirizine (ZYRTEC) 10 MG tablet Take 10 mg by mouth daily.   Yes [provider]  clotrimazole-betamethasone (LOTRISONE) cream Apply 1 application topically 2 (two) times daily. Patient taking differently: Apply 1 application topically 2 (two) times daily as needed (to affected areas). 09/22/20  Yes Just, Azalee CourseKelsea J, FNP  demeclocycline (DECLOMYCIN) 300 MG tablet Take 0.5 tablets (150 mg total) by mouth 2 (two) times daily. 11/17/20  Yes Just, Azalee CourseKelsea J, FNP   famotidine (PEPCID) 20 MG tablet Take 1 tablet (20 mg total) by mouth 2 (two) times daily. 09/22/20  Yes Just, Azalee CourseKelsea J, FNP  fluticasone (FLONASE) 50  MCG/ACT nasal spray SHAKE LIQUID AND USE 1 SPRAY IN EACH NOSTRIL TWICE DAILY Patient taking differently: Place 2 sprays into both nostrils daily. 12/16/20  Yes Just, Azalee Course, FNP  guaiFENesin (MUCINEX) 600 MG 12 hr tablet Take 1 tablet (600 mg total) by mouth 2 (two) times daily as needed for to loosen phlegm. Patient taking differently: Take 600 mg by mouth See admin instructions. Take 600 mg by mouth in the morning and an additional 600 mg at bedtime as needed for chest congestion 08/19/20  Yes Lezlie Lye, Meda Coffee, MD  hydrALAZINE (APRESOLINE) 10 MG tablet TAKE 1 TABLET(10 MG) BY MOUTH TWICE DAILY Patient taking differently: Take 10 mg by mouth 2 (two) times daily. 01/20/21  Yes Just, Azalee Course, FNP  lactose free nutrition (BOOST) LIQD Take 237 mLs by mouth 2 (two) times daily between meals.   Yes [provider]  levothyroxine (SYNTHROID) 50 MCG tablet TAKE 1 TABLET(50 MCG) BY MOUTH DAILY BEFORE BREAKFAST Patient taking differently: Take 50 mcg by mouth daily before breakfast. 09/22/20  Yes Just, Azalee Course, FNP  meclizine (ANTIVERT) 12.5 MG tablet Take 1 tablet (12.5 mg total) by mouth 3 (three) times daily as needed for dizziness. 01/19/21  Yes Just, Azalee Course, FNP  olopatadine (PATANOL) 0.1 % ophthalmic solution Place 1 drop into both eyes daily as needed for allergies. Patient taking differently: Place 1 drop into both eyes daily. 02/08/21  Yes Just, Azalee Course, FNP  omeprazole (PRILOSEC) 20 MG capsule Take 20 mg by mouth 2 (two) times daily.   Yes [provider]  oxybutynin (DITROPAN) 5 MG tablet Take 5 mg by mouth at bedtime. 01/10/21  Yes [provider]  OXYGEN Inhale 1.5-2 L/min into the lungs continuous.   Yes [provider]  polyethylene glycol powder (GLYCOLAX/MIRALAX) 17 GM/SCOOP powder Take 17 g by mouth in  the morning.   Yes [provider]  Probiotic Product (ALIGN) 4 MG CAPS Take 4 mg by mouth in the morning.   Yes [provider]  propranolol ER (INDERAL LA) 60 MG 24 hr capsule TAKE 1 CAPSULE(60 MG) BY MOUTH DAILY Patient taking differently: Take 60 mg by mouth in the morning. 09/22/20  Yes Just, Azalee Course, FNP  sodium chloride 1 g tablet Take 2 tablets (2 g total) by mouth 2 (two) times daily. 09/22/20 04/20/21 Yes Just, Azalee Course, FNP  azelastine (ASTELIN) 0.1 % nasal spray Place 1 spray into both nostrils 2 (two) times daily. Use in each nostril as directed Patient not taking: Reported on 04/19/2021 05/31/20   Glenford Bayley, NP  diclofenac sodium (VOLTAREN) 1 % GEL Apply 2 g topically 4 (four) times daily. Patient not taking: Reported on 04/19/2021 06/02/19   Doristine Bosworth, MD  glycerin adult 2 g suppository Place 1 suppository rectally as needed for constipation. Patient not taking: Reported on 04/19/2021 01/27/19   Lezlie Lye, Meda Coffee, MD  ipratropium (ATROVENT) 0.03 % nasal spray USE 2 SPRAYS IN [facility] NOSTRIL TWICE DAILY Patient not taking: Reported on 04/19/2021 04/01/20   Doristine Bosworth, MD  Respiratory Therapy Supplies (NEBULIZER) DEVI Use with nebulized albuterol solution as needed for wheezing, cough or shortness of breath 08/17/20   Lezlie Lye, Meda Coffee, MD  senna (SENOKOT) 8.6 MG TABS tablet Take 1 tablet (8.6 mg total) by mouth daily. Patient not taking: Reported on 04/19/2021 09/22/20   Just, Azalee Course, FNP     Chesney Suares Karilyn Cota, DO PGY-2 IMTS

## 2021-04-20 NOTE — Evaluation (Signed)
Physical Therapy Evaluation Patient Details Name: Tammy Fernandez MRN: [id number] DOB: 30-Oct-1924 Today's Date: 04/20/2021   History of Present Illness  87 y.o. female  who presents for evaluation of hypotension and bradycardia 04/19/21.  Per son, patient was seen by her pulmonologist yesterday and was found to have hyponatremia of 125 and elevated CO2 of 44. While in ED HR dropped to 38bpm, O2 levels continued to drop on increasing levels of supplemental O2 transferred to the ICU overnight with encephalopathy and acute on chronic hypercarbic/hypoxemic respiratory failure placed on BiPAP PMH: CVA, diastolic heart failure, hypothyroidism, chronic hyponatremia, external hemorrhoids, hyperlipidemia and hypertension  Clinical Impression  Pt encephalopathy cleared, pt able to answer questions however preoccupied by increased phlegm making conversation difficult. Pt's son in room and provides clarification of PLOF and home set up. Pt lives with son and daughter in single story home with 4 steps to enter, however family has placed lawnmower ramp in place temporarily until church can build permanent ramp. Pt uses RW in home and family uses w/c in community. Pt able to assist in self care with set up, and has a HHAide who assists 2 hr/daily. Family provides for iADLs. Pt is currently limited in safe mobility by increased O2 demand, and increased dizziness with movement, in presence of decreased strength and endurance. Pt's son reports history of vertigo after ear infection and that pt was having some success with Vestibular PT however had been placed on Meclazine. Pt still has dizziness and son thinks it has contributed to falling and then pt's decreased mobility as a result. Pt is min A for bed mobility and modA for transfer to chair due to dizziness. PT will request Vestibular PT evaluation in next PT session. PT also recommends discharge home with HHPT to continue to build strength and balance. PT will continue to  follow acutely.     Follow Up Recommendations Home health PT;Supervision/Assistance - 24 hour    Equipment Recommendations  None recommended by PT (has necessary equipment)    Recommendations for Other Services OT consult     Precautions / Restrictions Precautions Precautions: Fall Restrictions Weight Bearing Restrictions: No      Mobility  Bed Mobility Overal bed mobility: Needs Assistance Bed Mobility: Supine to Sit     Supine to sit: Min assist     General bed mobility comments: min A for guiding arm across body to bedrail, and for scooting hips to EoB    Transfers Overall transfer level: Needs assistance Equipment used: 1 person hand held assist Transfers: Stand Pivot Transfers   Stand pivot transfers: Mod assist       General transfer comment: modA for steadying with stepping from bed to recliner       Balance Overall balance assessment: Needs assistance Sitting-balance support: Feet unsupported;No upper extremity supported Sitting balance-[name]: Fair     Standing balance support: Bilateral upper extremity supported Standing balance-[name]: Poor                               Pertinent Vitals/Pain Pain Assessment: No/denies pain    Home Living Family/patient expects to be discharged to:: Private residence Living Arrangements: Children Available Help at Discharge: Family;Home health;Available 24 hours/day Type of Home: House Home Access: Stairs to enter;Ramped entrance (using Surveyor, mining ramp, working with church on Technical sales engineer) Entrance Stairs-Rails: Right Secretary/administrator of Steps: 4 Home Layout: One level Home Equipment: Wheelchair - manual;Bedside commode;Walker -  2 wheels      Prior Function Level of Independence: Needs assistance   Gait / Transfers Assistance Needed: ambulates short distances in home, however has been limited by dizziness  ADL's / Homemaking Assistance Needed: performs self care  with set up and assist from [facility], family provides iADLs        Hand Dominance   Dominant Hand: Right    Extremity/Trunk Assessment   Upper Extremity Assessment Upper Extremity Assessment: Defer to OT evaluation    Lower Extremity Assessment Lower Extremity Assessment: Overall WFL for tasks assessed;Generalized weakness (son comments that although pt doesn't walk as much as she has she has a foot bike and is constantly using it)       Communication   Communication: No difficulties  Cognition Arousal/Alertness: Awake/alert Behavior During Therapy: WFL for tasks assessed/performed Overall Cognitive Status: Within Functional Limits for tasks assessed                                 General Comments: not sure of time but otherwise oriented, son provided PLOF and home set up mainly due to pt having difficulty clearing phlegm from her throat with talking, perseverates on her vitals with movement      General Comments General comments (skin integrity, edema, etc.): VSS on 4L O2 via [place] Pt with c/o of dizziness with positional change, son reports she was diagnosed with vertigo after ear infection, and was making progress with PT exercises however pt ultimately placed on Meclazine, son report pt has stopped ambulating any distance due to the dizziness     Assessment/Plan    PT Assessment Patient needs continued PT services  PT Problem List Decreased activity tolerance;Decreased balance;Decreased mobility;Cardiopulmonary status limiting activity;Decreased cognition       PT Treatment Interventions DME instruction;Gait training;Functional mobility training;Therapeutic activities;Therapeutic exercise;Balance training;Cognitive remediation;Patient/family education    PT Goals (Current goals can be found in the Care Plan section)  Acute Rehab PT Goals Patient Stated Goal: be able to get into her walk in shower PT Goal Formulation: With patient/family Time For Goal  Achievement: 05/04/21 Potential to Achieve Goals: Good    Frequency Min 3X/week    AM-PAC PT "6 Clicks" Mobility  Outcome Measure Help needed turning from your back to your side while in a flat bed without using bedrails?: A Little Help needed moving from lying on your back to sitting on the side of a flat bed without using bedrails?: A Little Help needed moving to and from a bed to a chair (including a wheelchair)?: A Lot Help needed standing up from a chair using your arms (e.g., wheelchair or bedside chair)?: A Little Help needed to walk in hospital room?: A Lot Help needed climbing 3-5 steps with a railing? : A Lot 6 Click Score: 15    End of Session Equipment Utilized During Treatment: Gait belt;Oxygen Activity Tolerance: Other (comment) (Pt limited by dizziness) Patient left: in chair;with call bell/phone within reach;with chair alarm set;with family/visitor present Nurse Communication: Mobility status PT Visit Diagnosis: Unsteadiness on feet (R26.81);Other abnormalities of gait and mobility (R26.89);Muscle weakness (generalized) (M62.81);Difficulty in walking, not elsewhere classified (R26.2);Dizziness and giddiness (R42);History of falling (Z91.81)    Time: 8588-5027 PT Time Calculation (min) (ACUTE ONLY): 39 min   Charges:   PT Evaluation $PT Eval Moderate Complexity: 1 Mod PT Treatments $Therapeutic Activity: 23-37 mins        Aairah Negrette B. [employer] PT,  DPT Acute Rehabilitation Services Pager [phone] Office [phone]   Elon Alas Fleet 04/20/2021, 1:12 PM

## 2021-04-20 NOTE — Progress Notes (Signed)
Received to 6E02 as transfer from 6M ICU. VSS. Oriented to room/unit. Call bell in reach. Son into room at bedside.

## 2021-04-20 NOTE — ED Notes (Signed)
S/W Dr. Rachael Darby and notified of pt O2 sat decreasing and HR decreasing into 30's. New orders received.

## 2021-04-20 NOTE — Progress Notes (Signed)
Called by RN. Pt has had increasing lethargy and now not responsive.  Was started on Bipap for pCO2 of 90 on ABG.  Admitted with bradycardia and HFpEF, hyponatremia. Uses CPAP at night at home for chronic respiratory failure.  BP is low at 89/40 with HR of 40 Pt will withdraw from painful stimuli and scrunch up face but does not open eyes or talk.  Son at bedside. Discussed code status and clinical status with Son.  Pt is a full code.  Start levophed infusion.  Discussed with PCCM for critical care consult who will come evaluate pt.

## 2021-04-20 NOTE — Progress Notes (Signed)
eLink Physician-Brief Progress Note Patient Name: Tammy Fernandez DOB: Jan 14, 1924 MRN: [id number]   Date of Service  04/20/2021  HPI/Events of Note  Patient originally admitted by [facility] due to hypotension, bradycardia and hyponatremia initially thought to be at least in part due to home beta blocker and diuretic therapy, patient subsequently developed acute hypercapnic respiratory failure with progressive lethargy and was transferred to ICU and placed on BIPAP.  eICU Interventions  New Patient Evaluation.        Migdalia Dk 04/20/2021, 6:34 AM

## 2021-04-20 NOTE — ED Notes (Signed)
Dr. Rachael Darby came to bedside to assess pt. Pt has continued to decline. Only responsive to painful stimuli at this time. Is no longer verbally responding.

## 2021-04-21 DIAGNOSIS — I5033 Acute on chronic diastolic (congestive) heart failure: Secondary | ICD-10-CM | POA: Diagnosis not present

## 2021-04-21 DIAGNOSIS — J9611 Chronic respiratory failure with hypoxia: Secondary | ICD-10-CM | POA: Diagnosis not present

## 2021-04-21 DIAGNOSIS — R0602 Shortness of breath: Secondary | ICD-10-CM | POA: Diagnosis not present

## 2021-04-21 DIAGNOSIS — Z7189 Other specified counseling: Secondary | ICD-10-CM

## 2021-04-21 DIAGNOSIS — Z515 Encounter for palliative care: Secondary | ICD-10-CM | POA: Diagnosis not present

## 2021-04-21 DIAGNOSIS — R001 Bradycardia, unspecified: Secondary | ICD-10-CM | POA: Diagnosis not present

## 2021-04-21 LAB — BASIC METABOLIC PANEL
Anion gap: 10 (ref 5–15)
BUN: 14 mg/dL (ref 8–23)
CO2: 43 mmol/L — ABNORMAL HIGH (ref 22–32)
Calcium: 9.7 mg/dL (ref 8.9–10.3)
Chloride: 70 mmol/L — ABNORMAL LOW (ref 98–111)
Creatinine, Ser: 0.71 mg/dL (ref 0.44–1.00)
GFR, Estimated: >60 mL/min (ref >60)
Glucose, Bld: 154 mg/dL — ABNORMAL HIGH (ref 70–99)
Potassium: 3.6 mmol/L (ref 3.5–5.1)
Sodium: 123 mmol/L — ABNORMAL LOW (ref 135–145)

## 2021-04-21 MED ORDER — FUROSEMIDE 10 MG/ML IJ SOLN
20.0000 mg | Freq: Once | INTRAMUSCULAR | Status: AC
  Administered 2021-04-21: 20 mg via INTRAVENOUS
  Filled 2021-04-21: qty 2

## 2021-04-21 NOTE — Progress Notes (Signed)
RT removed patient from bipap and placed on 4l [place]. Vitals stable at this time. No complications. Patient is tolerating well. RT will continue to monitor.

## 2021-04-21 NOTE — Progress Notes (Signed)
PROGRESS NOTE    Tammy Fernandez  WUJ:[id number] DOB: 23-Feb-1924 DOA: 04/19/2021 PCP: Glenford BayleyWalsh, Elizabeth W, NP   Brief Narrative:  87 year old woman with increasing lethargy due to hypercarbic respiratory failure. Followed by Dr Vassie LollAlva for chronic hypercarbic respiratory failure due to elevated hemidiaphragm - possible chest wall restriction. Associated chronic hyponatremia in the 125 range due to persistent compensatory metabolic alkalosis. Admitted 5/18 to Hospitalist for hypotension and bradycardia. She had been experiencing weakness and decreased mobility.  Beta-blocker was held and patient was continued on diuretic. Patient has become more lethargic, pCO2 elevated at 90. PCCM consulted.   [facility] consultation for worsening lethargy, ABG showed pco2 of 90. Started on bipap and pressors overnight. 5/19 off pressors and BiPAP this morning, on HFNC 5/20 back on [facility] service   Assessment & Plan:   Active Problems:   GERD (gastroesophageal reflux disease)   Hypertension   Dysphagia, post-stroke   Hyponatremia   Hypothyroidism   Acute on chronic diastolic congestive heart failure (HCC)   Chronic respiratory failure with hypoxia (HCC)   Bradycardia   (HFpEF) heart failure with preserved ejection fraction (HCC)   SOB (shortness of breath)   Acute toxic encephalopathy in the setting of polypharmacy -Patient on benzodiazepines despite advanced age, lengthy discussion about need for cessation -No signs or symptoms of withdrawal at this point, will continue to hold benzos -Closely follow symptoms, family on board with decreasing these medications as well as narcotics  Acute hypoxic hypercapnic respiratory failure secondary to above  Likely exacerbated by known obesity hypoventilation syndrome and sleep apnea -Continue CPAP/BiPAP with naps and overnight -Currently on home oxygen at 2 L, continue to ambulate follow for hypoxia -Resolving in the setting of  discontinuation of benzodiazepines  Symptomatic bradycardia 2/2 above -Continues to resolve after cessation of benzos, improvement in hypercapnia and hypoxia -Continue to follow clinically -Previously on dopamine and phenylephrine, now off greater than 24 hours  Chronic hyponatremia -At baseline, continue to follow labs  Hypothyroidism, chronic, well controlled Lab Results  Component Value Date   TSH 1.759 04/19/2021   Anxiety, chronic -Continue to hold any narcotics or benzodiazepines, if patient has chronic anxiety at her age she would benefit from SSRI or similar over benzodiazepines  DVT prophylaxis: Lovenox Code Status: Full Family Communication: At bedside  Status is: Inpatient  Dispo: The patient is from: Home              Anticipated d/c is to: To be determined              Anticipated d/c date is: 48 to 72 hours              Patient currently not medically stable for discharge  Consultants:   PCCM, pulmonology  Procedures:   None  Antimicrobials:  None  Subjective: No acute issues or events overnight, patient denies nausea vomiting diarrhea constipation headache fevers or chills.  Objective: Vitals:   04/20/21 2314 04/21/21 0023 04/21/21 0305 04/21/21 0735  BP:  (!) 161/69 (!) 166/64 (!) 160/65  Pulse: 72 80 85 83  Resp:  (!) 29 (!) 28 (!) 29  Temp:  97.8 F (36.6 C) 97.9 F (36.6 C) 98.1 F (36.7 C)  TempSrc:  Axillary Oral Oral  SpO2: 100% 100% 99% 100%  Weight:      Height:        Intake/Output Summary (Last 24 hours) at 04/21/2021 0751 Last data filed at 04/21/2021 0026 Gross per  24 hour  Intake 32.06 ml  Output 4230 ml  Net -4197.94 ml   Filed Weights   04/19/21 1426 04/20/21 0523  Weight: 79.4 kg 76 kg    Examination:  General:  Pleasantly resting in bed, No acute distress.  Somewhat somnolent but easily arousable HEENT:  Normocephalic atraumatic.  Sclerae nonicteric, noninjected.  Extraocular movements intact bilaterally. Neck:   Without mass or deformity.  Trachea is midline. Lungs: Diminished breath sounds bilaterally without rhonchi, wheeze, or rales. Heart:  Regular rate and rhythm.  Without murmurs, rubs, or gallops. Abdomen:  Soft, nontender, nondistended.  Without guarding or rebound. Extremities: Without cyanosis, clubbing, edema, or obvious deformity. Vascular:  Dorsalis pedis and posterior tibial pulses palpable bilaterally. Skin:  Warm and dry, no erythema, no ulcerations.    Data Reviewed: I have personally reviewed following labs and imaging studies  CBC: Recent Labs  Lab 04/19/21 1531 04/20/21 0110 04/20/21 0139  WBC 5.6 4.2  --   NEUTROABS 4.2 2.8  --   HGB 8.1* 7.9* 8.8*  HCT 26.7* 25.9* 26.0*  MCV 93.0 92.5  --   PLT 198 224  --    Basic Metabolic Panel: Recent Labs  Lab 04/18/21 1238 04/19/21 1851 04/20/21 0139 04/20/21 0215  NA 125* 125* 124* 125*  K 4.4 5.2* 4.8 5.6*  CL 81* 80*  --  79*  CO2 44* 39*  --  39*  GLUCOSE 200* 107*  --  89  BUN 17 18  --  21  CREATININE 0.55 0.57  --  0.61  CALCIUM 9.8 9.7  --  9.4   GFR: Estimated Creatinine Clearance: 41.1 mL/min (by C-G formula based on SCr of 0.61 mg/dL). Liver Function Tests: Recent Labs  Lab 04/19/21 1531  AST 22  ALT 11  ALKPHOS 69  BILITOT 0.4  PROT 6.5  ALBUMIN 2.8*   No results for input(s): LIPASE, AMYLASE in the last 168 hours. No results for input(s): AMMONIA in the last 168 hours. Coagulation Profile: No results for input(s): INR, PROTIME in the last 168 hours. Cardiac Enzymes: No results for input(s): CKTOTAL, CKMB, CKMBINDEX, TROPONINI in the last 168 hours. BNP (last 3 results) No results for input(s): PROBNP in the last 8760 hours. HbA1C: No results for input(s): HGBA1C in the last 72 hours. CBG: Recent Labs  Lab 04/20/21 0519  GLUCAP 92   Lipid Profile: No results for input(s): CHOL, HDL, LDLCALC, TRIG, CHOLHDL, LDLDIRECT in the last 72 hours. Thyroid Function Tests: Recent Labs     04/19/21 1532  TSH 1.759   Anemia Panel: No results for input(s): VITAMINB12, FOLATE, FERRITIN, TIBC, IRON, RETICCTPCT in the last 72 hours. Sepsis Labs: No results for input(s): PROCALCITON, LATICACIDVEN in the last 168 hours.  Recent Results (from the past 240 hour(s))  SARS CORONAVIRUS 2 (TAT 6-24 HRS) Nasopharyngeal Nasopharyngeal Swab     Status: None   Collection Time: 04/20/21 12:58 AM   Specimen: Nasopharyngeal Swab  Result Value Ref Range Status   SARS Coronavirus 2 NEGATIVE NEGATIVE Final    Comment: (NOTE) SARS-CoV-2 target nucleic acids are NOT DETECTED.  The SARS-CoV-2 RNA is generally detectable in upper and lower respiratory specimens during the acute phase of infection. Negative results do not preclude SARS-CoV-2 infection, do not rule out co-infections with other pathogens, and should not be used as the sole basis for treatment or other patient management decisions. Negative results must be combined with clinical observations, patient history, and epidemiological information. The expected result is Negative.  Fact Sheet for Patients: HairSlick.no  Fact Sheet for Healthcare Providers: quierodirigir.com  This test is not yet approved or cleared by the [place] FDA and  has been authorized for detection and/or diagnosis of SARS-CoV-2 by FDA under an Emergency Use Authorization (EUA). This EUA will remain  in effect (meaning this test can be used) for the duration of the COVID-19 declaration under Se ction 564(b)(1) of the Act, 21 U.S.C. section 360bbb-3(b)(1), unless the authorization is terminated or revoked sooner.  Performed at [facility], [address], [place], [place] [address]          Radiology Studies: DG Chest 1 View  Result Date: 04/20/2021 CLINICAL DATA:  Hypoxia EXAM: CHEST  1 VIEW COMPARISON:  Apr 19, 2021 FINDINGS: The heart size and mediastinal contours largely  obscured but appear stable. Aortic atherosclerosis. Low lung volumes. Increased right greater than left pleural effusions with bilateral interstitial and airspace opacities. The visualized skeletal structures are unchanged. IMPRESSION: Increased right greater than left pleural effusions with bilateral interstitial and airspace opacities, likely representing pulmonary edema. Aortic Atherosclerosis (ICD10-I70.0). Electronically Signed   By: Maudry Mayhew MD   On: 04/20/2021 02:37   DG Chest 2 View  Result Date: 04/19/2021 CLINICAL DATA:  Shortness of breath. EXAM: CHEST - 2 VIEW COMPARISON:  September 22, 2020 FINDINGS: Decreased lung volumes are seen which is likely secondary to the degree of patient inspiration. Mild to moderate severity areas of atelectasis and/or infiltrate are noted within the bilateral lung bases. There is no evidence of a pleural effusion or pneumothorax. Mild elevation of the right hemidiaphragm is seen. The heart size and mediastinal contours are within normal limits. There is marked severity calcification of the thoracic aorta. Degenerative changes are noted throughout the thoracic spine. IMPRESSION: Low lung volumes with mild to moderate severity bibasilar atelectasis and/or infiltrate. Electronically Signed   By: Aram Candela M.D.   On: 04/19/2021 16:22   CT Head Wo Contrast  Result Date: 04/19/2021 CLINICAL DATA:  Headache hypotension EXAM: CT HEAD WITHOUT CONTRAST TECHNIQUE: Contiguous axial images were obtained from the base of the skull through the vertex without intravenous contrast. COMPARISON:  CT brain 01/01/2019 FINDINGS: Brain: No acute territorial infarction, hemorrhage, or intracranial mass. Patchy white matter hypodensity consistent with chronic small vessel ischemic change. Vascular: No hyperdense vessels. Vertebral and carotid vascular calcification Skull: Normal. Negative for fracture or focal lesion. Sinuses/Orbits: No acute finding. Other: None IMPRESSION: 1.  No CT evidence for acute intracranial abnormality. 2. Mild atrophy and chronic small vessel ischemic change of the white matter Electronically Signed   By: Jasmine Pang M.D.   On: 04/19/2021 18:17        Scheduled Meds: . amLODipine  10 mg Oral q AM  . aspirin EC  81 mg Oral q AM  . Chlorhexidine Gluconate Cloth  6 each Topical Daily  . demeclocycline  150 mg Oral BID  . enoxaparin (LOVENOX) injection  40 mg Subcutaneous Q24H  . fluticasone  2 spray Each Nare Daily  . hydrALAZINE  10 mg Oral BID  . lactose free nutrition  237 mL Oral BID BM  . levothyroxine  50 mcg Oral QAC breakfast  . loratadine  10 mg Oral Daily  . oxybutynin  5 mg Oral QHS  . pantoprazole  40 mg Oral Daily  . polyethylene glycol  17 g Oral Daily  . senna  1 tablet Oral Daily  . sodium chloride flush  3 mL Intravenous Q12H  .  sodium chloride  2 g Oral BID   Continuous Infusions: . sodium chloride    . sodium chloride       LOS: 1 day    Time spent:   Azucena Fallen, DO [facility]  If 7PM-7AM, please contact night-coverage [email or web address]  04/21/2021, 7:51 AM

## 2021-04-21 NOTE — Evaluation (Signed)
Occupational Therapy Evaluation Patient Details Name: IDELL HISSONG MRN: [id number] DOB: 1924-09-05 Today's Date: 04/21/2021    History of Present Illness 87 y.o. female  who presents for evaluation of hypotension and bradycardia 04/19/21.  Per son, patient was seen by her pulmonologist yesterday and was found to have hyponatremia of 125 and elevated CO2 of 44. While in ED HR dropped to 38bpm, O2 levels continued to drop on increasing levels of supplemental O2 transferred to the ICU overnight with encephalopathy and acute on chronic hypercarbic/hypoxemic respiratory failure placed on BiPAP PMH: CVA, diastolic heart failure, hypothyroidism, chronic hyponatremia, external hemorrhoids, hyperlipidemia and hypertension   Clinical Impression   Pt admitted to ED for concerns listed above. PTA pt was ambulating short distances at home with a RW, requiring minimal assistance with bathing and dressing. Pt lives with her son and daughter, who provide assistance as needed. At the time of the evaluation, pt required min assist for functional mobility and transfers, set up for seated ADL's and max assist for LB bathing and dressing. Pt activity tolerance is low, requiring rest breaks and increased verbal cueing for safety. Pt expresses wishes to get stronger and mobilize more, OT educated pt and son on progressing mobility and the importance of getting OOB daily multiple times a day. Acute OT will continue to follow to address all concerns listed below.     Follow Up Recommendations  Home health OT;Supervision - Intermittent    Equipment Recommendations  None recommended by OT    Recommendations for Other Services       Precautions / Restrictions Precautions Precautions: Fall Restrictions Weight Bearing Restrictions: No      Mobility Bed Mobility Overal bed mobility: Needs Assistance Bed Mobility: Supine to Sit     Supine to sit: Min assist     General bed mobility comments: min A for guiding  arm across body to bedrail, and for scooting hips to EoB    Transfers Overall transfer level: Needs assistance Equipment used: Rolling walker (2 wheeled) Transfers: Sit to/from [employer] Sit to Stand: Min assist Stand pivot transfers: Min assist       General transfer comment: Min A for powering up to stand and steadying herself once in standing.    Balance Overall balance assessment: Needs assistance Sitting-balance support: Feet unsupported;No upper extremity supported Sitting balance-[name]: Fair Sitting balance - Comments: Pt kicks her feet up when sitting and maintains balance, can not tolerate any challenges to static sitting.   Standing balance support: Bilateral upper extremity supported Standing balance-[name]: Poor Standing balance comment: Needs asssistance standing and steadying                           ADL either performed or assessed with clinical judgement   ADL Overall ADL's : Needs assistance/impaired Eating/Feeding: Set up;Sitting Eating/Feeding Details (indicate cue type and reason): Able to self feed once food is placed in front of her. Grooming: Wash/dry hands;Wash/dry face;Sitting Grooming Details (indicate cue type and reason): completed sitting EOB Upper Body Bathing: Min guard;Set up Upper Body Bathing Details (indicate cue type and reason): Min guard for safety when sitting EOB Lower Body Bathing: Maximal assistance Lower Body Bathing Details (indicate cue type and reason): MAx assist when pt standing to complete pericare Upper Body Dressing : Set up;Sitting Upper Body Dressing Details (indicate cue type and reason): able to doff and don hospital gown Lower Body Dressing: Maximal assistance;Sitting/lateral leans;Sit to/from stand Lower  Body Dressing Details (indicate cue type and reason): Needs assist putting socks and underwear on due to inability to reach feet. Toilet Transfer: Minimal  Cabin crew Details (indicate cue type and reason): simulated with transfer to recliner. Min assist for safety and to assist with powering up. Toileting- Clothing Manipulation and Hygiene: Maximal assistance;Sitting/lateral lean;Sit to/from stand Toileting - Architect Details (indicate cue type and reason): Max assist when standing to complete pericare Tub/ Shower Transfer: Minimal assistance;Stand-pivot Web designer Details (indicate cue type and reason): simulated transferring to recliner, needs min assist for safety, powering up and walker managment. Functional mobility during ADLs: Minimal assistance General ADL Comments: Min assist for powering up and walker managemtn     Vision Baseline Vision/History: Macular Degeneration;Wears glasses Wears Glasses: At all times Patient Visual Report: No change from baseline Vision Assessment?: No apparent visual deficits Additional Comments: Previously pt complained of dizziness, at this time pt reports she is not experiencing any dizziness.     Perception Perception Perception Tested?: No   Praxis Praxis Praxis tested?: Not tested    Pertinent Vitals/Pain Pain Assessment: No/denies pain     Hand Dominance Right   Extremity/Trunk Assessment Upper Extremity Assessment Upper Extremity Assessment: Generalized weakness   Lower Extremity Assessment Lower Extremity Assessment: Defer to PT evaluation   Cervical / Trunk Assessment Cervical / Trunk Assessment: Normal   Communication Communication Communication: No difficulties   Cognition Arousal/Alertness: Awake/alert Behavior During Therapy: WFL for tasks assessed/performed Overall Cognitive Status: Within Functional Limits for tasks assessed                                     General Comments  VSS on 4L. RN bumped pt down to 3L during session due to sats maintaining at 95%. Pt not complaining of dizziness during this  session.    Exercises     Shoulder Instructions      Home Living Family/patient expects to be discharged to:: Private residence Living Arrangements: Children Available Help at Discharge: Family;Home health;Available 24 hours/day Type of Home: House Home Access: Stairs to enter;Ramped entrance Entrance Stairs-Number of Steps: 4 Entrance Stairs-Rails: Right Home Layout: One level     Bathroom Shower/Tub: Producer, television/film/video: Standard Bathroom Accessibility: Yes   Home Equipment: Wheelchair - manual;Bedside commode;Walker - 2 wheels          Prior Functioning/Environment Level of Independence: Needs assistance  Gait / Transfers Assistance Needed: ambulates short distances in home, however has been limited by dizziness ADL's / Homemaking Assistance Needed: performs self care with set up and assist from [facility], family provides iADLs            OT Problem List: Decreased strength;Decreased range of motion;Decreased activity tolerance;Impaired balance (sitting and/or standing);Decreased coordination;Decreased safety awareness;Decreased knowledge of use of DME or AE      OT Treatment/Interventions: Therapeutic exercise;Self-care/ADL training;DME and/or AE instruction;Energy conservation;Therapeutic activities;Patient/family education;Balance training    OT Goals(Current goals can be found in the care plan section) Acute Rehab OT Goals Patient Stated Goal: To take her own shower OT Goal Formulation: With patient/family Time For Goal Achievement: 04/21/21 Potential to Achieve Goals: Good ADL Goals Pt Will Perform Grooming: with modified independence;standing Pt Will Perform Lower Body Bathing: with set-up;with supervision;sit to/from stand;sitting/lateral leans Pt Will Transfer to Toilet: with supervision;ambulating Pt Will Perform Toileting - Clothing Manipulation and hygiene: with supervision;sitting/lateral leans;sit to/from stand Additional ADL  Goal #1: Pt  will tolerate standing at the sink to complete grooming tasks.  OT Frequency: Min 2X/week   Barriers to D/C:            Co-evaluation              AM-PAC OT "6 Clicks" Daily Activity     Outcome Measure Help from another person eating meals?: A Little Help from another person taking care of personal grooming?: A Little Help from another person toileting, which includes using toliet, bedpan, or urinal?: A Little Help from another person bathing (including washing, rinsing, drying)?: A Lot Help from another person to put on and taking off regular upper body clothing?: A Little Help from another person to put on and taking off regular lower body clothing?: A Lot 6 Click Score: 16   End of Session Equipment Utilized During Treatment: Rolling walker;Oxygen Nurse Communication: Mobility status  Activity Tolerance: Patient tolerated treatment well Patient left: in chair;with call bell/phone within reach;with family/visitor present;with nursing/sitter in room  OT Visit Diagnosis: Unsteadiness on feet (R26.81);Other abnormalities of gait and mobility (R26.89);Muscle weakness (generalized) (M62.81)                Time: 9604-5409 OT Time Calculation (min): 28 min Charges:  OT General Charges $OT Visit: 1 Visit OT Evaluation $OT Eval Moderate Complexity: 1 Mod OT Treatments $Self Care/Home Management : 8-22 mins  Marquavius Scaife H., OTR/L Acute Rehabilitation  Marvon [place] Elane Sylena Lotter 04/21/2021, 10:08 AM

## 2021-04-21 NOTE — TOC Initial Note (Signed)
Transition of Care [facility]) - Initial/Assessment Note    Patient Details  Name: Tammy Fernandez MRN: [id number] Date of Birth: 1924/01/26  Transition of Care [facility]) CM/SW Contact:    Curlene Labrum, RN Phone Number: 04/21/2021, 9:51 AM  Clinical Narrative:                 Case management met with the patient in the room regarding transitions of care to home possibly this weekend.  I spoke with the son at the bedside.  Home Health orders placed for continued services for PT/RN to be co-signed by Dr. Avon Gully.  The patient is currently active with [facility] - called and spoke with CM and she is aware that patient might D/C home Saturday with family to restart services.  Patient has available dme at home including Home O2, WC, RW and temporary ramp until permanent one is built.  CM will continue to follow for discharge planning.  Expected Discharge Plan: [place] Barriers to Discharge: Continued Medical Work up   Patient Goals and CMS Choice Patient states their goals for this hospitalization and ongoing recovery are:: Patient plans to go home with family when feeling better. CMS Medicare.gov Compare Post Acute Care list provided to:: Patient Choice offered to / list presented to : Patient  Expected Discharge Plan and Services Expected Discharge Plan: [place] In-house Referral: Clinical Social Work Discharge Planning Services: CM Consult Post Acute Care Choice: [place] arrangements for the past 2 months: [place]: RN,PT [place] Agency:  (currently Active with [facility] for PT/RN) Date [place]: 04/21/21 Time [place]: [phone] Representative spoke with at [place]: contacted CM with [facility] - aware of possible discharge to home this week  Prior Living Arrangements/Services Living arrangements for the past 2 months: [place] with:: Adult  Children Patient language and need for interpreter reviewed:: Yes Do you feel safe going back to the place where you live?: Yes      Need for Family Participation in Patient Care: Yes (Comment) Care giver support system in place?: Yes (comment) Current home services: DME,Home RN,Home PT Criminal Activity/Legal Involvement Pertinent to Current Situation/Hospitalization: No - Comment as needed  Activities of Daily Living Home Assistive Devices/Equipment: Oxygen,Walker (specify type) ADL Screening (condition at time of admission) Patient's cognitive ability adequate to safely complete daily activities?: Yes Is the patient deaf or have difficulty hearing?: Yes Does the patient have difficulty seeing, even when wearing glasses/contacts?: Yes Does the patient have difficulty concentrating, remembering, or making decisions?: No Patient able to express need for assistance with ADLs?: Yes Does the patient have difficulty dressing or bathing?: Yes Independently performs ADLs?: No Dressing (OT): Needs assistance Is this a change from baseline?: Pre-admission baseline Grooming: Needs assistance Is this a change from baseline?: Pre-admission baseline Feeding: Independent Bathing: Needs assistance Is this a change from baseline?: Pre-admission baseline Toileting: Needs assistance Is this a change from baseline?: Pre-admission baseline In/Out Bed: Needs assistance Is this a change from baseline?: Pre-admission baseline Walks in Home: Needs assistance Is this a change from baseline?: Pre-admission baseline Does the patient have difficulty walking or climbing stairs?: Yes Weakness of Legs: Both Weakness of Arms/Hands: Both  Permission Sought/Granted Permission sought to share information with : Case Manager Permission granted to share  information with : Yes, Verbal Permission Granted     Permission granted to share info w AGENCY: [facility]        Emotional Assessment Appearance:: Appears  stated age Attitude/Demeanor/Rapport: Gracious Affect (typically observed): Accepting Orientation: : Oriented to Self,Oriented to Place,Oriented to Situation Alcohol / Substance Use: Not Applicable Psych Involvement: No (comment)  Admission diagnosis:  Bradycardia [R00.1] SOB (shortness of breath) [R06.02] Hypoxia [R09.02] Acute on chronic diastolic congestive heart failure (HCC) [I50.33] (HFpEF) heart failure with preserved ejection fraction (HCC) [I50.30] Anemia, unspecified type [D64.9] Patient Active Problem List   Diagnosis Date Noted  . (HFpEF) heart failure with preserved ejection fraction ([place]) 04/20/2021  . SOB (shortness of breath)   . Bradycardia 04/19/2021  . Parkinson's disease ([place]) 09/22/2020  . Chronic sinusitis 06/01/2020  . OSA on CPAP 06/01/2020  . SIADH (syndrome of inappropriate ADH production) ([place]) 02/09/2020  . Pressure injury of skin 01/08/2019  . Hypochloremic alkalosis 12/27/2018  . Oxygen dependent 06/10/2018  . Chest pain, atypical 05/23/2018  . Chronic respiratory failure with hypoxia ([place]) 05/23/2018  . Long term prescription benzodiazepine use 04/23/2018  . Hyperlipidemia   . Acute on chronic diastolic congestive heart failure ([place]) 12/25/2017  . Severe pulmonary arterial systolic hypertension ([place]) 12/25/2017  . Essential hypertension 08/29/2016  . Hypothyroidism 08/29/2016  . Abnormal EKG   . Elevated troponin   . NSTEMI (non-ST elevated myocardial infarction) ([place]) 08/28/2016  . Decubitus ulcer of sacral region, stage 3 ([place]) 12/18/2015  . Klebsiella cystitis 12/16/2015  . Acute delirium 12/16/2015  . Aspiration pneumonia ([place]) 12/16/2015  . Leukocytosis   . Hypokalemia   . Encounter for central line placement   . Anxiety state   . Essential hypertension   . Physical deconditioning   . Altered mental status   . Chronic diarrhea   . Hyponatremia   . History of CVA (cerebrovascular accident) 01/19/2014  . Dysphagia, post-stroke  01/15/2014  . Bacteremia due to Staphylococcus 01/13/2014  . Left hemiplegia ([place]) 01/12/2014  . Hypertension 01/11/2014  . Acute hyponatremia 01/11/2014  . Hyperkalemia 01/11/2014  . Hematuria 01/11/2014  . E. coli UTI (urinary tract infection) 01/11/2014  . GERD (gastroesophageal reflux disease) 11/14/2011   PCP:  Martyn Ehrich, NP Pharmacy:   [facility] [place], [address] [place] [address] Phone: [phone] Fax: [phone]     Social Determinants of Health (SDOH) Interventions    Readmission Risk Interventions No flowsheet data found.

## 2021-04-21 NOTE — Plan of Care (Signed)
  Problem: Education: Goal: Knowledge of General Education information will improve Description Including pain rating scale, medication(s)/side effects and non-pharmacologic comfort measures Outcome: Progressing   

## 2021-04-21 NOTE — Progress Notes (Signed)
NAME:  Tammy Fernandez, MRN:  [id number], DOB:  [birth year], LOS: 1 ADMISSION DATE:  04/19/2021, CONSULTATION DATE:  04/20/2021 REFERRING MD:  [place] - TRH, CHIEF COMPLAINT:  Hypercarbic respiratory failure    History of Present Illness:  87 year old woman with increasing lethargy due to hypercarbic respiratory failure.   Followed by Dr Vassie Loll for chronic hypercarbic respiratory failure due to elevated hemidiaphragm - possible chest wall restriction. Associated chronic hyponatremia in the 125 range due to persistent compensatory metabolic alkalosis.   Admitted 5/18 to Hospitalist for hypotension and bradycardia. She had been experiencing weakness and decreased mobility.  Beta-blocker was held and patient was continued on diuretic.  Patient has become more lethargic, pCO2 elevated at 90. PCCM consulted.   Pertinent  Medical History   Past Medical History:  Diagnosis Date  . Anxiety   . Chronic lower back pain   . Constipation    h/o fecal disimpaction on 2017  . CVA (cerebral vascular accident) (HCC) 2015   neg workup, MRI small acute lacunar infarcts. ASA only  . Depression   . Diastolic CHF, chronic (HCC)    grade I, most recent echo Jan 2019  . Fatty liver   . GERD (gastroesophageal reflux disease)   . Hyperlipidemia   . Hypertension   . Hyponatremia    multiple episodes with encephalopathy, renal thinks SIADH  . Hypothyroidism, unspecified   . Internal hemorrhoid   . NSTEMI (non-ST elevated myocardial infarction) (HCC) 08/28/2016  . Palpitations   . PFO with atrial septal aneurysm    TTE 2015  . Pneumonia 2016  . Severe pulmonary arterial systolic hypertension (HCC) 12/25/2017   per echo, on 1L oxgen via [facility]   Significant Hospital Events: Including procedures, antibiotic start and stop dates in addition to other pertinent events   . 5/18 admission to [facility] . 5/19 PCCM consultation for worsening lethargy, ABG showed pco2 of 90. Started on bipap and pressors overnight. . 5/19  off pressors and BiPAP this morning, on HFNC, Transferred to the medical floor   Interim History / Subjective:   Patient tolerated bipap overnight. She is alert and awake this morning with her son at the bedside. She is without complaints at this time.  Objective   Blood pressure (!) 160/65, pulse 83, temperature 98.1 F (36.7 C), temperature source Oral, resp. rate (!) 29, height 5\' 2"  (1.575 m), weight 76 kg, SpO2 100 %.    FiO2 (%):  [50 %] 50 %   Intake/Output Summary (Last 24 hours) at 04/21/2021 0920 Last data filed at 04/21/2021 0735 Gross per 24 hour  Intake 25 ml  Output 4430 ml  Net -4405 ml   Filed Weights   04/19/21 1426 04/20/21 0523  Weight: 79.4 kg 76 kg    Examination: General: elderly woman, no acute distress  HENT: [place]/AT, moist mucous membranes, sclera anicteric Lungs: diminished breath sounds. No wheezing. Cardiovascular: s1, s2, no murmurs rubs or gallops, RRR Abdomen: soft, nontender, bowel sounds present Extremities: no LE edema Neuro:follows commands, answers questions appropriately  GU: deferred  Labs/imaging that I have personally reviewed  (right click and "Reselect all SmartList Selections" daily)  CXR reviewed from 5/19 ABG 5/19 CMP, CBC from 5/19  Resolved Hospital Problem list   Bradycardia  Assessment & Plan:   Acute on chronic hypercapnic respiratory failure Hx of chest wall restriction    Pulmonary hypertension    - Continue bipap qHS    - Goal SpO2 around 92%, wean supplemental O2  as able    - Will diurese with 20 mg IV furosemide today  Decompensated HFpEF  - Continue home meds amlodipine and hydralazine  - Continue to hold propanolol  - Continue diuresis as above    Chronic hyponatremia - Na 125, stable since admission - Consider tolvaptan if hyponatremia worsens   Acute metabolic encephalopathy - Likely due to hypercarbia  - Much improved  - Continue to monitor   Hypothyroidism - Continue synthroid   Anxiety -  Continue alprazolam 0.5 mg bid prn  Best practice (right click and "Reselect all SmartList Selections" daily)  Diet:  Oral Pain/Anxiety/Delirium protocol (if indicated): No VAP protocol (if indicated): Not indicated DVT prophylaxis: Subcutaneous Heparin GI prophylaxis: N/A Glucose control:  SSI No Central venous access:  N/A Arterial line:  N/A Foley:  N/A Mobility:  bed rest  PT consulted: N/A Last date of multidisciplinary goals of care discussion [Son updated at bedside] Palliative care has been consulted for further discussion. I recommended that she be changed to DNR/DNI with the ability to use NIV if they choose. Family open to discussing options with palliative care. Code Status:  full code Disposition: Medical Floor  Labs   CBC: Recent Labs  Lab 04/19/21 1531 04/20/21 0110 04/20/21 0139  WBC 5.6 4.2  --   NEUTROABS 4.2 2.8  --   HGB 8.1* 7.9* 8.8*  HCT 26.7* 25.9* 26.0*  MCV 93.0 92.5  --   PLT 198 224  --     Basic Metabolic Panel: Recent Labs  Lab 04/18/21 1238 04/19/21 1851 04/20/21 0139 04/20/21 0215  NA 125* 125* 124* 125*  K 4.4 5.2* 4.8 5.6*  CL 81* 80*  --  79*  CO2 44* 39*  --  39*  GLUCOSE 200* 107*  --  89  BUN 17 18  --  21  CREATININE 0.55 0.57  --  0.61  CALCIUM 9.8 9.7  --  9.4   GFR: Estimated Creatinine Clearance: 41.1 mL/min (by C-G formula based on SCr of 0.61 mg/dL). Recent Labs  Lab 04/19/21 1531 04/20/21 0110  WBC 5.6 4.2    Liver Function Tests: Recent Labs  Lab 04/19/21 1531  AST 22  ALT 11  ALKPHOS 69  BILITOT 0.4  PROT 6.5  ALBUMIN 2.8*   No results for input(s): LIPASE, AMYLASE in the last 168 hours. No results for input(s): AMMONIA in the last 168 hours.  ABG    Component Value Date/Time   PHART 7.520 (H) 04/20/2021 0534   PCO2ART 52.0 (H) 04/20/2021 0534   PO2ART 148 (H) 04/20/2021 0534   HCO3 42.2 (H) 04/20/2021 0534   TCO2 49 (H) 04/20/2021 0139   O2SAT 99.5 04/20/2021 0534     Coagulation  Profile: No results for input(s): INR, PROTIME in the last 168 hours.  Cardiac Enzymes: No results for input(s): CKTOTAL, CKMB, CKMBINDEX, TROPONINI in the last 168 hours.  HbA1C: Hgb A1c MFr Bld  Date/Time Value Ref Range Status  09/22/2020 03:17 PM 5.4 4.8 - 5.6 % Final    Comment:             Prediabetes: 5.7 - 6.4          Diabetes: >6.4          Glycemic control for adults with diabetes: <7.0   10/31/2015 06:07 AM 7.2 (H) 4.8 - 5.6 % Final    Comment:    (NOTE)         Pre-diabetes: 5.7 - 6.4  Diabetes: >6.4         Glycemic control for adults with diabetes: <7.0     CBG: Recent Labs  Lab 04/20/21 0519  GLUCAP 92    Melody Comas, MD [facility] Office: [phone]   See Amion for personal pager PCCM on call pager [phone] until 7pm. Please call [place] 7p-7a. [phone]

## 2021-04-21 NOTE — Consult Note (Signed)
Consultation Note Date: 04/21/2021   Patient Name: Tammy Fernandez  DOB: [birth year]  MRN: [id number]  Age / Sex: 87 y.o., female  PCP: Martyn Ehrich, NP Referring Physician: Little Ishikawa, MD  Reason for Consultation: Hospice Evaluation  HPI/Patient Profile: 87 y.o. female  with past medical history of CVA, diastolic heart failure, chronic hyponatremia, coronary artery disease, PFO, severe pulmonary hypertension, obstructive sleep apnea, dysphagia, sacral wound, chronic respiratory failure who was admitted on 04/19/2021 with hypotension and bradycardia.  After admission she had worsening respiratory failure with an elevated CO2 (90) requiring admission to the ICU for pressors and BiPAP.  The next morning she was able to be taken off of pressors and is currently on 2 L nasal cannula it is recommended strongly that the patient use CPAP each night and whenever she is napping at home.   Clinical Assessment and Goals of Care: I have reviewed medical records including EPIC notes, labs and imaging, received report from RN, assessed the patient and then met at the bedside along with her son Tammy Fernandez to discuss diagnosis prognosis, [place], EOL wishes, disposition and options.  Tammy Fernandez is the patient's primary caretaker at home.  The patient has 7 children.  I introduced Palliative Medicine as specialized medical care for people living with serious illness. It focuses on providing relief from the symptoms and stress of a serious illness. The goal is to improve quality of life for both the patient and the family.  Tammy Fernandez expressed that his mother had had palliative services previously.  He stated "she needs more than that ".  The nurse practitioner came out once every month or 6 weeks and we need more than that now.  I described hospice services to him (nurse, aide, chaplain, social worker) and hospice philosophy of wanting to  be at home and be comfortable rather than running back and forth to the hospital.  Tammy Fernandez was in agreement with all of that and stated that his sisters were as well.  We discussed a brief life review of the patient and then focused on their current illness. The natural disease trajectory and expectations at EOL were discussed.  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  She and Tammy Fernandez live at home in [place].  I attempted to elicit values and goals of care important to the patient.  The patient wants to be at home with her family and be comfortable.  Hospice and Palliative Care services outpatient were explained and offered.  Tammy Fernandez commented that he and his sisters were talking about involving hospice prior to this admission.   Primary Decision Maker:  NEXT OF KIN.   Patient's primary care giver is her son Tammy Fernandez.    SUMMARY OF RECOMMENDATIONS    TOC order placed to offer choice.  Tammy Fernandez has had a conversation with [facility], but stated if there is a hospice with access to his mother's records at [facility] it was important to him to go that way.  Code Status/Advance Care Planning:  Full code.   Symptom Management:   CPAP naps and at bedtime.   Sedating medications have been reduced.  betablockers eliminated.  Additional Recommendations (Limitations, Scope, Preferences):  Per Tammy Fernandez patient's goals are more in line with care in the home and avoiding the hospital.  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  Prognosis:  Less than 6 months given refractory hypercarbia, limited mobility due to breathing and fatigue,  severe pulmonary htn.    Discharge Planning: Home with Hospice      Primary Diagnoses: Present on Admission: . GERD (gastroesophageal reflux disease) .  Hypertension . Hyponatremia . Chronic respiratory failure with hypoxia ([place]) . Bradycardia . Hypothyroidism . (HFpEF) heart failure with preserved ejection fraction ([place])   I have reviewed the medical record, interviewed the patient and family, and examined the patient. The following aspects are pertinent.  Past Medical History:  Diagnosis Date  . Anxiety   . Chronic lower back pain   . Constipation    h/o fecal disimpaction on 2017  . CVA (cerebral vascular accident) ([place]) 2015   neg workup, MRI small acute lacunar infarcts. ASA only  . Depression   . Diastolic CHF, chronic (HCC)    grade I, most recent echo Jan 2019  . Fatty liver   . GERD (gastroesophageal reflux disease)   . Hyperlipidemia   . Hypertension   . Hyponatremia    multiple episodes with encephalopathy, renal thinks SIADH  . Hypothyroidism, unspecified   . Internal hemorrhoid   . NSTEMI (non-ST elevated myocardial infarction) ([place]) 08/28/2016  . Palpitations   . PFO with atrial septal aneurysm    TTE 2015  . Pneumonia 2016  . Severe pulmonary arterial systolic hypertension ([place]) 12/25/2017   per echo, on 1L oxgen via [place]   Social History   Socioeconomic History  . Marital status: Widowed    Spouse name: Not on file  . Number of children: 7  . Years of education: Not on file  . Highest education level: Not on file  Occupational History  . Not on file  Tobacco Use  . Smoking status: Never Smoker  . Smokeless tobacco: Former Systems developer    Types: Snuff  . Tobacco comment: "used snuff when I was 19"  Vaping Use  . Vaping Use: Never used  Substance and Sexual Activity  . Alcohol use: No  . Drug use: No  . Sexual activity: Not Currently  Other Topics Concern  . Not on file  Social History Narrative   ** Merged History Encounter **       Patient lives at home with her son         Social Determinants of Health   Financial Resource Strain: Not on file  Food Insecurity: Not on file  Transportation  Needs: Not on file  Physical Activity: Not on file  Stress: Not on file  Social Connections: Not on file   Family History  Problem Relation Age of Onset  . Stroke Father   . Hypertension Father   . Colon cancer Cousin   . Colon cancer Other        Aunt  . Diabetes Other        aunt and cousins  . Heart disease Other        cousins  . Healthy Daughter   . Healthy Son  Allergies  Allergen Reactions  . Crestor [Rosuvastatin Calcium] Other (See Comments)    Muscle Aches  . Keflex [Cephalexin] Other (See Comments)    Dizziness   . Peanut Butter Flavor Hives  . Sulfa Antibiotics Hives and Rash  . Cephalexin Other (See Comments)    Dizziness   . Peanut-Containing Drug Products Hives  . Rosuvastatin Other (See Comments)    Muscle aches  . Sulfa Drugs Cross Reactors Hives and Rash     Vital Signs: BP (!) 160/65 (BP Location: Right Arm)   Pulse 83   Temp 98.1 F (36.7 C) (Oral)   Resp (!) 29   Ht 5' 2"  (1.575 m)   Wt 76 kg   SpO2 100%   BMI 30.65 kg/m  Pain Scale: 0-10   Pain Score: 0-No pain   SpO2: SpO2: 100 % O2 Device:SpO2: 100 % O2 Flow Rate: .O2 Flow Rate (L/min): 2.5 L/min    Palliative Assessment/Data:  4-%     Time In: 2:00 Time Out: 2:30 Time Total: 30 min. Visit consisted of counseling and education dealing with the complex and emotionally intense issues surrounding the need for palliative care and symptom management in the setting of serious and potentially life-threatening illness. Greater than 50%  of this time was spent counseling and coordinating care related to the above assessment and plan.  Signed by: Florentina Jenny, PA-C Palliative Medicine  Please contact Palliative Medicine Team phone at [phone] for questions and concerns.  For individual provider: See Shea Evans

## 2021-04-22 DIAGNOSIS — J9611 Chronic respiratory failure with hypoxia: Secondary | ICD-10-CM | POA: Diagnosis not present

## 2021-04-22 LAB — BASIC METABOLIC PANEL
Anion gap: 11 (ref 5–15)
BUN: 14 mg/dL (ref 8–23)
CO2: 42 mmol/L — ABNORMAL HIGH (ref 22–32)
Calcium: 9.9 mg/dL (ref 8.9–10.3)
Chloride: 69 mmol/L — ABNORMAL LOW (ref 98–111)
Creatinine, Ser: 0.54 mg/dL (ref 0.44–1.00)
GFR, Estimated: >60 mL/min (ref >60)
Glucose, Bld: 104 mg/dL — ABNORMAL HIGH (ref 70–99)
Potassium: 3.8 mmol/L (ref 3.5–5.1)
Sodium: 122 mmol/L — ABNORMAL LOW (ref 135–145)

## 2021-04-22 LAB — CBC
HCT: 29.3 % — ABNORMAL LOW (ref 36.0–46.0)
Hemoglobin: 9.4 g/dL — ABNORMAL LOW (ref 12.0–15.0)
MCH: 27.6 pg (ref 26.0–34.0)
MCHC: 32.1 g/dL (ref 30.0–36.0)
MCV: 86.2 fL (ref 80.0–100.0)
Platelets: 272 10*3/uL (ref 150–400)
RBC: 3.4 MIL/uL — ABNORMAL LOW (ref 3.87–5.11)
RDW: 13.7 % (ref 11.5–15.5)
WBC: 7.6 10*3/uL (ref 4.0–10.5)
nRBC: 0.4 % — ABNORMAL HIGH (ref 0.0–0.2)

## 2021-04-22 MED ORDER — HYDRALAZINE HCL 25 MG PO TABS
25.0000 mg | ORAL_TABLET | Freq: Two times a day (BID) | ORAL | Status: DC
  Administered 2021-04-22 – 2021-04-23 (×2): 25 mg via ORAL
  Filled 2021-04-22 (×2): qty 1

## 2021-04-22 NOTE — Progress Notes (Signed)
Got patient up to sit on the side of the bed per MD/family request to try to transfer to the chair. Pt stated she felt dizzy and didn't feel good when we stood her up so we sat her back down on the bed.   Checked BP it was 183/73. HR 81. Patient was left in the bed due to symptoms. Will continue to monitor.

## 2021-04-22 NOTE — Progress Notes (Signed)
PROGRESS NOTE    Tammy Fernandez  KGU:[id number] DOB: Nov 06, 1924 DOA: 04/19/2021 PCP: Glenford Bayley, NP   Brief Narrative:  87 year old woman with increasing lethargy due to hypercarbic respiratory failure. Followed by Dr Vassie Loll for chronic hypercarbic respiratory failure due to elevated hemidiaphragm - possible chest wall restriction. Associated chronic hyponatremia in the 125 range due to persistent compensatory metabolic alkalosis. Admitted 5/18 to Hospitalist for hypotension and bradycardia. She had been experiencing weakness and decreased mobility.  Beta-blocker was held and patient was continued on diuretic. Patient has become more lethargic, pCO2 elevated at 90. PCCM consulted.   5/18 admission to [facility] 5/19 PCCM consultation for worsening lethargy, ABG showed pco2 of 90. Started on bipap and pressors overnight. 5/19 off pressors and BiPAP this morning, on HFNC 5/20 back on [facility] service  Assessment & Plan:   Active Problems:   GERD (gastroesophageal reflux disease)   Hypertension   Dysphagia, post-stroke   Hyponatremia   Hypothyroidism   Acute on chronic diastolic congestive heart failure (HCC)   Chronic respiratory failure with hypoxia (HCC)   Bradycardia   (HFpEF) heart failure with preserved ejection fraction (HCC)   SOB (shortness of breath)   Acute toxic encephalopathy in the setting of polypharmacy -Patient on benzodiazepines despite advanced age, lengthy discussion about need for cessation -No signs or symptoms of withdrawal at this point, will continue to hold benzos -Closely follow symptoms, family on board with decreasing these medications as well as narcotics  Acute hypoxic hypercapnic respiratory failure secondary to above  Likely exacerbated by known obesity hypoventilation syndrome and sleep apnea -Continue CPAP/BiPAP with naps and overnight -Currently on home oxygen at 2 L, continue to ambulate follow for hypoxia -Resolving in the setting of  discontinuation of benzodiazepines  Symptomatic bradycardia 2/2 above -Continues to resolve after cessation of benzos, improvement in hypercapnia and hypoxia -Continue to follow clinically -Previously on dopamine and phenylephrine, now off greater than 24 hours  Chronic hyponatremia -At baseline, continue to follow labs  Hypothyroidism, chronic, well controlled Lab Results  Component Value Date   TSH 1.759 04/19/2021   Anxiety, chronic -Continue to hold any narcotics or benzodiazepines, if patient has chronic anxiety at her age she would benefit from SSRI or similar over benzodiazepines  DVT prophylaxis: Lovenox Code Status: Full Family Communication: At bedside  Status is: Inpatient  Dispo: The patient is from: Home              Anticipated d/c is to: To be determined              Anticipated d/c date is: 24-48 hours              Patient currently not medically stable for discharge  Consultants:   PCCM, pulmonology  Procedures:   None  Antimicrobials:  None  Subjective: No acute issues or events overnight, patient much more awake alert oriented this morning, requesting further work-up with physical therapy as she is requesting discharge home and we discussed she would need to be able to ambulate safely by herself prior to discharge home otherwise we would be discussing SNF placement which she is attempting to avoid.  Objective: Vitals:   04/21/21 2350 04/21/21 2358 04/22/21 0357 04/22/21 0803  BP:  (!) 188/79 (!) 185/69 (!) 172/61  Pulse:  81 78 85  Resp:  18 18 16   Temp:  (!) 97.3 F (36.3 C) (!) 97.5 F (36.4 C) (!) 97.4 F (36.3 C)  TempSrc: Axillary Axillary Axillary  Oral  SpO2: 98% 100% 99% 97%  Weight:      Height:        Intake/Output Summary (Last 24 hours) at 04/22/2021 0818 Last data filed at 04/22/2021 0600 Gross per 24 hour  Intake 120 ml  Output 1600 ml  Net -1480 ml   Filed Weights   04/19/21 1426 04/20/21 0523  Weight: 79.4 kg 76 kg     Examination:  General:  Pleasantly resting in bed, No acute distress.  Awake alert oriented, back to baseline HEENT:  Normocephalic atraumatic.  Sclerae nonicteric, noninjected.  Extraocular movements intact bilaterally. Neck:  Without mass or deformity.  Trachea is midline. Lungs: Diminished breath sounds bilaterally without rhonchi, wheeze, or rales. Heart:  Regular rate and rhythm.  Without murmurs, rubs, or gallops. Abdomen:  Soft, nontender, nondistended.  Without guarding or rebound. Extremities: Without cyanosis, clubbing, edema, or obvious deformity. Vascular:  Dorsalis pedis and posterior tibial pulses palpable bilaterally. Skin:  Warm and dry, no erythema, no ulcerations.  Data Reviewed: I have personally reviewed following labs and imaging studies  CBC: Recent Labs  Lab 04/19/21 1531 04/20/21 0110 04/20/21 0139 04/22/21 0146  WBC 5.6 4.2  --  7.6  NEUTROABS 4.2 2.8  --   --   HGB 8.1* 7.9* 8.8* 9.4*  HCT 26.7* 25.9* 26.0* 29.3*  MCV 93.0 92.5  --  86.2  PLT 198 224  --  272   Basic Metabolic Panel: Recent Labs  Lab 04/18/21 1238 04/19/21 1851 04/20/21 0139 04/20/21 0215 04/21/21 1126 04/22/21 0146  NA 125* 125* 124* 125* 123* 122*  K 4.4 5.2* 4.8 5.6* 3.6 3.8  CL 81* 80*  --  79* 70* 69*  CO2 44* 39*  --  39* 43* 42*  GLUCOSE 200* 107*  --  89 154* 104*  BUN 17 18  --  21 14 14   CREATININE 0.55 0.57  --  0.61 0.71 0.54  CALCIUM 9.8 9.7  --  9.4 9.7 9.9   GFR: Estimated Creatinine Clearance: 41.1 mL/min (by C-G formula based on SCr of 0.54 mg/dL). Liver Function Tests: Recent Labs  Lab 04/19/21 1531  AST 22  ALT 11  ALKPHOS 69  BILITOT 0.4  PROT 6.5  ALBUMIN 2.8*   No results for input(s): LIPASE, AMYLASE in the last 168 hours. No results for input(s): AMMONIA in the last 168 hours. Coagulation Profile: No results for input(s): INR, PROTIME in the last 168 hours. Cardiac Enzymes: No results for input(s): CKTOTAL, CKMB, CKMBINDEX,  TROPONINI in the last 168 hours. BNP (last 3 results) No results for input(s): PROBNP in the last 8760 hours. HbA1C: No results for input(s): HGBA1C in the last 72 hours. CBG: Recent Labs  Lab 04/20/21 0519  GLUCAP 92   Lipid Profile: No results for input(s): CHOL, HDL, LDLCALC, TRIG, CHOLHDL, LDLDIRECT in the last 72 hours. Thyroid Function Tests: Recent Labs    04/19/21 1532  TSH 1.759   Anemia Panel: No results for input(s): VITAMINB12, FOLATE, FERRITIN, TIBC, IRON, RETICCTPCT in the last 72 hours. Sepsis Labs: No results for input(s): PROCALCITON, LATICACIDVEN in the last 168 hours.  Recent Results (from the past 240 hour(s))  SARS CORONAVIRUS 2 (TAT 6-24 HRS) Nasopharyngeal Nasopharyngeal Swab     Status: None   Collection Time: 04/20/21 12:58 AM   Specimen: Nasopharyngeal Swab  Result Value Ref Range Status   SARS Coronavirus 2 NEGATIVE NEGATIVE Final    Comment: (NOTE) SARS-CoV-2 target nucleic acids are NOT DETECTED.  The  SARS-CoV-2 RNA is generally detectable in upper and lower respiratory specimens during the acute phase of infection. Negative results do not preclude SARS-CoV-2 infection, do not rule out co-infections with other pathogens, and should not be used as the sole basis for treatment or other patient management decisions. Negative results must be combined with clinical observations, patient history, and epidemiological information. The expected result is Negative.  Fact Sheet for Patients: HairSlick.no  Fact Sheet for Healthcare Providers: quierodirigir.com  This test is not yet approved or cleared by the [place] FDA and  has been authorized for detection and/or diagnosis of SARS-CoV-2 by FDA under an Emergency Use Authorization (EUA). This EUA will remain  in effect (meaning this test can be used) for the duration of the COVID-19 declaration under Se ction 564(b)(1) of the Act, 21  U.S.C. section 360bbb-3(b)(1), unless the authorization is terminated or revoked sooner.  Performed at [facility], [address], [place], [place] [address]          Radiology Studies: No results found.      Scheduled Meds: . amLODipine  10 mg Oral q AM  . aspirin EC  81 mg Oral q AM  . Chlorhexidine Gluconate Cloth  6 each Topical Daily  . demeclocycline  150 mg Oral BID  . enoxaparin (LOVENOX) injection  40 mg Subcutaneous Q24H  . fluticasone  2 spray Each Nare Daily  . hydrALAZINE  10 mg Oral BID  . lactose free nutrition  237 mL Oral BID BM  . levothyroxine  50 mcg Oral QAC breakfast  . loratadine  10 mg Oral Daily  . oxybutynin  5 mg Oral QHS  . pantoprazole  40 mg Oral Daily  . polyethylene glycol  17 g Oral Daily  . senna  1 tablet Oral Daily  . sodium chloride flush  3 mL Intravenous Q12H  . sodium chloride  2 g Oral BID   Continuous Infusions: . sodium chloride    . sodium chloride       LOS: 2 days    Time spent:   Azucena Fallen, DO [facility]  If 7PM-7AM, please contact night-coverage [email or web address]  04/22/2021, 8:18 AM

## 2021-04-22 NOTE — Progress Notes (Signed)
Notified MD of pt asymptomatic run of VTACH.

## 2021-04-23 DIAGNOSIS — Z515 Encounter for palliative care: Secondary | ICD-10-CM | POA: Diagnosis not present

## 2021-04-23 DIAGNOSIS — J9611 Chronic respiratory failure with hypoxia: Secondary | ICD-10-CM | POA: Diagnosis not present

## 2021-04-23 DIAGNOSIS — I5033 Acute on chronic diastolic (congestive) heart failure: Secondary | ICD-10-CM | POA: Diagnosis not present

## 2021-04-23 DIAGNOSIS — R0902 Hypoxemia: Secondary | ICD-10-CM | POA: Diagnosis not present

## 2021-04-23 DIAGNOSIS — E871 Hypo-osmolality and hyponatremia: Secondary | ICD-10-CM | POA: Diagnosis not present

## 2021-04-23 LAB — CBC
HCT: 29 % — ABNORMAL LOW (ref 36.0–46.0)
Hemoglobin: 9.5 g/dL — ABNORMAL LOW (ref 12.0–15.0)
MCH: 28.2 pg (ref 26.0–34.0)
MCHC: 32.8 g/dL (ref 30.0–36.0)
MCV: 86.1 fL (ref 80.0–100.0)
Platelets: 319 10*3/uL (ref 150–400)
RBC: 3.37 MIL/uL — ABNORMAL LOW (ref 3.87–5.11)
RDW: 13.8 % (ref 11.5–15.5)
WBC: 9.9 10*3/uL (ref 4.0–10.5)
nRBC: 0.2 % (ref 0.0–0.2)

## 2021-04-23 LAB — BASIC METABOLIC PANEL
Anion gap: 8 (ref 5–15)
BUN: 13 mg/dL (ref 8–23)
CO2: 42 mmol/L — ABNORMAL HIGH (ref 22–32)
Calcium: 10.1 mg/dL (ref 8.9–10.3)
Chloride: 72 mmol/L — ABNORMAL LOW (ref 98–111)
Creatinine, Ser: 0.58 mg/dL (ref 0.44–1.00)
GFR, Estimated: >60 mL/min (ref >60)
Glucose, Bld: 94 mg/dL (ref 70–99)
Potassium: 5.2 mmol/L — ABNORMAL HIGH (ref 3.5–5.1)
Sodium: 122 mmol/L — ABNORMAL LOW (ref 135–145)

## 2021-04-23 MED ORDER — POLYETHYLENE GLYCOL 3350 17 G PO PACK: 17.0000 g | PACK | Freq: Three times a day (TID) | ORAL | Status: DC

## 2021-04-23 MED ORDER — HYDROXYZINE HCL 25 MG PO TABS: 25.0000 mg | ORAL_TABLET | Freq: Two times a day (BID) | ORAL | Status: DC | PRN

## 2021-04-23 MED ORDER — HYDRALAZINE HCL 50 MG PO TABS
50.0000 mg | ORAL_TABLET | Freq: Three times a day (TID) | ORAL | Status: DC
  Administered 2021-04-23 – 2021-04-24 (×5): 50 mg via ORAL
  Filled 2021-04-23 (×5): qty 1

## 2021-04-23 MED ORDER — SENNA 8.6 MG PO TABS
2.0000 | ORAL_TABLET | Freq: Every day | ORAL | Status: DC
  Administered 2021-04-24: 17.2 mg via ORAL
  Filled 2021-04-23: qty 2

## 2021-04-23 MED ORDER — POLYETHYLENE GLYCOL 3350 17 G PO PACK
17.0000 g | PACK | Freq: Three times a day (TID) | ORAL | Status: DC
  Administered 2021-04-23 – 2021-04-24 (×4): 17 g via ORAL
  Filled 2021-04-23 (×5): qty 1

## 2021-04-23 NOTE — Progress Notes (Signed)
PROGRESS NOTE    Tammy Fernandez  PXT:[id number] DOB: 23-Mar-1924 DOA: 04/19/2021 PCP: Glenford Bayley, NP   Brief Narrative:  87 year old woman with increasing lethargy due to hypercarbic respiratory failure. Followed by Dr Vassie Loll for chronic hypercarbic respiratory failure due to elevated hemidiaphragm - possible chest wall restriction. Associated chronic hyponatremia in the 125 range due to persistent compensatory metabolic alkalosis. Admitted 5/18 to Hospitalist for hypotension and bradycardia. She had been experiencing weakness and decreased mobility.  Beta-blocker was held and patient was continued on diuretic. Patient has become more lethargic, pCO2 elevated at 90. PCCM consulted.   5/18 admission to [facility] 5/19 PCCM consultation for worsening lethargy, ABG showed pco2 of 90. Started on bipap and pressors overnight. 5/19 off pressors and BiPAP this morning, on HFNC 5/20 back on [facility] service  Assessment & Plan:   Active Problems:   GERD (gastroesophageal reflux disease)   Hypertension   Dysphagia, post-stroke   Hyponatremia   Hypothyroidism   Acute on chronic diastolic congestive heart failure (HCC)   Chronic respiratory failure with hypoxia (HCC)   Bradycardia   (HFpEF) heart failure with preserved ejection fraction (HCC)   SOB (shortness of breath)   Acute toxic encephalopathy in the setting of polypharmacy -Patient on benzodiazepines despite advanced age, lengthy discussion about need for cessation -No signs or symptoms of withdrawal at this point, will continue to hold benzos -Closely follow symptoms, family on board with decreasing these medications as well as narcotics  Acute on chronic hypoxic hypercapnic respiratory failure secondary to above  Likely exacerbated by known obesity hypoventilation syndrome and sleep apnea -Continue CPAP/BiPAP with naps and overnight -Currently on home oxygen at 2 L, continue to ambulate follow for hypoxia -Resolving in the setting of  discontinuation of benzodiazepines  Ambulatory dysfunction Questionable orthostatic hypotension - Follow orthostatics - Poorly tolerating PT yesterday - will try again today  Symptomatic bradycardia 2/2 above, resolved -Continues to resolve after cessation of benzos, improvement in hypercapnia and hypoxia -Continue to follow clinically -Previously on dopamine and phenylephrine, now off greater than 24 hours  Chronic hyponatremia -At baseline, continue to follow labs  Hypothyroidism, chronic, well controlled Lab Results  Component Value Date   TSH 1.759 04/19/2021   Anxiety, chronic -Continue to hold any narcotics or benzodiazepines, if patient has chronic anxiety at her age she would benefit from SSRI/SNRI or similar over benzodiazepines per BEERS criteria  DVT prophylaxis: Lovenox Code Status: Full Family Communication: At bedside  Status is: Inpatient  Dispo: The patient is from: Home              Anticipated d/c is to: To be determined              Anticipated d/c date is: 24-48 hours              Patient currently not medically stable for discharge  Consultants:   PCCM, pulmonology  Procedures:   None  Antimicrobials:  None  Subjective: No acute issues or events overnight, back to baseline mental status - she still is insisting on discharge home despite inability to even stand yesterday. Continue to discuss safe discharge  Objective: Vitals:   04/23/21 0335 04/23/21 0635 04/23/21 0659 04/23/21 0745  BP: (!) 181/59 (!) 164/63  (!) 184/64  Pulse: 88   87  Resp:    16  Temp: 97.7 F (36.5 C)   98 F (36.7 C)  TempSrc: Axillary   Oral  SpO2: 100%  92% 90%  Weight:      Height:        Intake/Output Summary (Last 24 hours) at 04/23/2021 0754 Last data filed at 04/22/2021 2218 Gross per 24 hour  Intake 719 ml  Output --  Net 719 ml   Filed Weights   04/19/21 1426 04/20/21 0523  Weight: 79.4 kg 76 kg    Examination:  General:  Pleasantly resting  in bed, No acute distress.  Awake alert oriented, back to baseline HEENT:  Normocephalic atraumatic.  Sclerae nonicteric, noninjected.  Extraocular movements intact bilaterally. Neck:  Without mass or deformity.  Trachea is midline. Lungs: Diminished breath sounds bilaterally without rhonchi, wheeze, or rales. Heart:  Regular rate and rhythm.  Without murmurs, rubs, or gallops. Abdomen:  Soft, nontender, nondistended.  Without guarding or rebound. Extremities: Without cyanosis, clubbing, edema, or obvious deformity. Vascular:  Dorsalis pedis and posterior tibial pulses palpable bilaterally. Skin:  Warm and dry, no erythema, no ulcerations.  Data Reviewed: I have personally reviewed following labs and imaging studies  CBC: Recent Labs  Lab 04/19/21 1531 04/20/21 0110 04/20/21 0139 04/22/21 0146 04/23/21 0320  WBC 5.6 4.2  --  7.6 9.9  NEUTROABS 4.2 2.8  --   --   --   HGB 8.1* 7.9* 8.8* 9.4* 9.5*  HCT 26.7* 25.9* 26.0* 29.3* 29.0*  MCV 93.0 92.5  --  86.2 86.1  PLT 198 224  --  272 319   Basic Metabolic Panel: Recent Labs  Lab 04/19/21 1851 04/20/21 0139 04/20/21 0215 04/21/21 1126 04/22/21 0146 04/23/21 0320  NA 125* 124* 125* 123* 122* 122*  K 5.2* 4.8 5.6* 3.6 3.8 5.2*  CL 80*  --  79* 70* 69* 72*  CO2 39*  --  39* 43* 42* 42*  GLUCOSE 107*  --  89 154* 104* 94  BUN 18  --  21 14 14 13   CREATININE 0.57  --  0.61 0.71 0.54 0.58  CALCIUM 9.7  --  9.4 9.7 9.9 10.1   GFR: Estimated Creatinine Clearance: 41.1 mL/min (by C-G formula based on SCr of 0.58 mg/dL). Liver Function Tests: Recent Labs  Lab 04/19/21 1531  AST 22  ALT 11  ALKPHOS 69  BILITOT 0.4  PROT 6.5  ALBUMIN 2.8*   No results for input(s): LIPASE, AMYLASE in the last 168 hours. No results for input(s): AMMONIA in the last 168 hours. Coagulation Profile: No results for input(s): INR, PROTIME in the last 168 hours. Cardiac Enzymes: No results for input(s): CKTOTAL, CKMB, CKMBINDEX, TROPONINI in  the last 168 hours. BNP (last 3 results) No results for input(s): PROBNP in the last 8760 hours. HbA1C: No results for input(s): HGBA1C in the last 72 hours. CBG: Recent Labs  Lab 04/20/21 0519  GLUCAP 92   Lipid Profile: No results for input(s): CHOL, HDL, LDLCALC, TRIG, CHOLHDL, LDLDIRECT in the last 72 hours. Thyroid Function Tests: No results for input(s): TSH, T4TOTAL, FREET4, T3FREE, THYROIDAB in the last 72 hours. Anemia Panel: No results for input(s): VITAMINB12, FOLATE, FERRITIN, TIBC, IRON, RETICCTPCT in the last 72 hours. Sepsis Labs: No results for input(s): PROCALCITON, LATICACIDVEN in the last 168 hours.  Recent Results (from the past 240 hour(s))  SARS CORONAVIRUS 2 (TAT 6-24 HRS) Nasopharyngeal Nasopharyngeal Swab     Status: None   Collection Time: 04/20/21 12:58 AM   Specimen: Nasopharyngeal Swab  Result Value Ref Range Status   SARS Coronavirus 2 NEGATIVE NEGATIVE Final    Comment: (NOTE) SARS-CoV-2 target nucleic acids are NOT  DETECTED.  The SARS-CoV-2 RNA is generally detectable in upper and lower respiratory specimens during the acute phase of infection. Negative results do not preclude SARS-CoV-2 infection, do not rule out co-infections with other pathogens, and should not be used as the sole basis for treatment or other patient management decisions. Negative results must be combined with clinical observations, patient history, and epidemiological information. The expected result is Negative.  Fact Sheet for Patients: HairSlick.no  Fact Sheet for Healthcare Providers: quierodirigir.com  This test is not yet approved or cleared by the [place] FDA and  has been authorized for detection and/or diagnosis of SARS-CoV-2 by FDA under an Emergency Use Authorization (EUA). This EUA will remain  in effect (meaning this test can be used) for the duration of the COVID-19 declaration under Se ction  564(b)(1) of the Act, 21 U.S.C. section 360bbb-3(b)(1), unless the authorization is terminated or revoked sooner.  Performed at [facility], [address] [place], [place] [address]          Radiology Studies: No results found.      Scheduled Meds: . amLODipine  10 mg Oral q AM  . aspirin EC  81 mg Oral q AM  . Chlorhexidine Gluconate Cloth  6 each Topical Daily  . demeclocycline  150 mg Oral BID  . enoxaparin (LOVENOX) injection  40 mg Subcutaneous Q24H  . fluticasone  2 spray Each Nare Daily  . hydrALAZINE  25 mg Oral BID  . lactose free nutrition  237 mL Oral BID BM  . levothyroxine  50 mcg Oral QAC breakfast  . loratadine  10 mg Oral Daily  . oxybutynin  5 mg Oral QHS  . pantoprazole  40 mg Oral Daily  . polyethylene glycol  17 g Oral Daily  . senna  1 tablet Oral Daily  . sodium chloride flush  3 mL Intravenous Q12H  . sodium chloride  2 g Oral BID   Continuous Infusions: . sodium chloride    . sodium chloride       LOS: 3 days    Time spent:   Azucena Fallen, DO [facility]  If 7PM-7AM, please contact night-coverage [email or web address]  04/23/2021, 7:54 AM

## 2021-04-23 NOTE — Progress Notes (Signed)
Daily Progress Note   Patient Name: Tammy Fernandez       Date: 04/23/2021 DOB: 23-Jul-1924  Age: 87 y.o. MRN#: [id number] Attending Physician: Little Ishikawa, MD Primary Care Physician: Martyn Ehrich, NP Admit Date: 04/19/2021  Reason for Consultation/Follow-up:  To discuss complex medical decision making related to patient's goals of care  Subjective: Met with patient at bedside.  She appears comfortable.  Her only complaint is that her food "came back up on me".  She feels she is constipated and states she has not had a BM in 1 week.   She takes prune juice at home and refuses suppository.   She is more alert and interactive today.  I spoke with [name] outside the room.   I explained that the hospice philosophy is one where the patient wants to stay at home and be comfortable.  Avoid trips to hospitals, doctors offices, and lab draws.  Usually it comes in to play when the patient is in their final 6 months of life.    [name] is in agreement with avoiding hospitals and doctors offices.  He wants his mother at home and comfortable. However, at this point he would still want to draw blood to monitor her sodium and other levels.  He feels she has much longer than 6 months.  He would like for her to have a home health RN and PT.     He expressed concern over the discontinuation of benzodiazepine and discussed potentially just reducing the dose.   Assessment: Patient more alert, appears comfortable.  Family opts for Home Health services at home not Palliative or Hospice.   Patient Profile/HPI:  87 y.o. female  with past medical history of CVA, diastolic heart failure, chronic hyponatremia, coronary artery disease, PFO, severe pulmonary hypertension, obstructive sleep apnea, dysphagia,  sacral wound, chronic respiratory failure who was admitted on 04/19/2021 with hypotension and bradycardia.  After admission she had worsening respiratory failure with an elevated CO2 (90) requiring admission to the ICU for pressors and BiPAP.  The next morning she was able to be taken off of pressors and is currently on 2 L nasal cannula it is recommended strongly that the patient use CPAP each night and whenever she is napping at home.   Length of Stay: 3   Vital Signs: BP Marland Kitchen)  168/61 (BP Location: Right Arm)   Pulse 82   Temp 97.6 F (36.4 C) (Oral)   Resp 16   Ht 5' 2"  (1.575 m)   Wt 76 kg   SpO2 92%   BMI 30.65 kg/m  SpO2: SpO2: 92 % O2 Device: O2 Device: Room Air O2 Flow Rate: O2 Flow Rate (L/min): 2 L/min       Palliative Assessment/Data:  40%     Palliative Care Plan    Recommendations/Plan: Family opts for Home Health services at home not Palliative or Hospice. Patient has good bowel regimen in place.  Will increase Senna tonight.  May need ducolax suppository or enema if no success. Discussed Jerome's concern over benzodiazepines with Dr. Avon Gully who is managing patient's overall care. PMT will sign off.   Please call us back in the future if we can be of service to this kind family.  Code Status:  Full code  Prognosis:  < 6 months    Discharge Planning: Home with Home Health RN and PT  Care plan was discussed with Dr. Avon Gully and [name]  Thank you for allowing the Palliative Medicine Team to assist in the care of this patient.  Total time spent:  25 min.     Greater than 50%  of this time was spent counseling and coordinating care related to the above assessment and plan.  Florentina Jenny, PA-C Palliative Medicine  Please contact Palliative MedicineTeam phone at [phone] for questions and concerns between 7 am - 7 pm.   Please see AMION for individual provider pager numbers.

## 2021-04-23 NOTE — Progress Notes (Signed)
Physical Therapy Treatment Patient Details Name: Tammy Fernandez MRN: [id number] DOB: December 19, 1923 Today's Date: 04/23/2021    History of Present Illness 87 y.o. female  who presents for evaluation of hypotension and bradycardia 04/19/21.  Per son, patient was seen by her pulmonologist yesterday and was found to have hyponatremia of 125 and elevated CO2 of 44. While in ED HR dropped to 38bpm, O2 levels continued to drop on increasing levels of supplemental O2 transferred to the ICU overnight with encephalopathy and acute on chronic hypercarbic/hypoxemic respiratory failure placed on BiPAP PMH: CVA, diastolic heart failure, hypothyroidism, chronic hyponatremia, external hemorrhoids, hyperlipidemia and hypertension    PT Comments    Pt received in bed, declining any attempts OOB due to dizziness. Pt also with c/o being hot. Thermostat on lowest setting. Pt offered a fan but she declined. Agreeable to UE/LE exercises in bed. Attempted gaze stabilization exercise. Pt reported it made the dizziness worse. Lunch tray arrived during session. Pt requesting no further exercises so she could eat. Pt re-adjusted in bed. Tray set up. PT to continue mobilization efforts as dizziness resolves.     Follow Up Recommendations  Home health PT;Supervision/Assistance - 24 hour     Equipment Recommendations  None recommended by PT    Recommendations for Other Services       Precautions / Restrictions Precautions Precautions: Fall Restrictions Weight Bearing Restrictions: No    Mobility  Bed Mobility                    Transfers                    Ambulation/Gait                 Stairs             Wheelchair Mobility    Modified Rankin (Stroke Patients Only)       Balance                                            Cognition Arousal/Alertness: Awake/alert Behavior During Therapy: WFL for tasks assessed/performed Overall Cognitive Status:  Within Functional Limits for tasks assessed                                        Exercises General Exercises - Upper Extremity Shoulder Flexion: AAROM;Right;Left;10 reps Elbow Flexion: AROM;Right;Left;10 reps General Exercises - Lower Extremity Ankle Circles/Pumps: Both;AROM;10 reps Heel Slides: AROM;Right;Left;5 reps Hip ABduction/ADduction: AAROM;Right;Left;10 reps    General Comments General comments (skin integrity, edema, etc.): Pt on 1L O2. SpO2 88-93%. HR in 80s. BP 168/69      Pertinent Vitals/Pain Pain Assessment: No/denies pain    Home Living                      Prior Function            PT Goals (current goals can now be found in the care plan section) Acute Rehab PT Goals Patient Stated Goal: not stated Progress towards PT goals: Not progressing toward goals - comment (continued dizziness)    Frequency    Min 3X/week      PT Plan Current plan remains appropriate    Co-evaluation  AM-PAC PT "6 Clicks" Mobility   Outcome Measure  Help needed turning from your back to your side while in a flat bed without using bedrails?: A Little Help needed moving from lying on your back to sitting on the side of a flat bed without using bedrails?: A Little Help needed moving to and from a bed to a chair (including a wheelchair)?: A Lot Help needed standing up from a chair using your arms (e.g., wheelchair or bedside chair)?: A Little Help needed to walk in hospital room?: A Lot Help needed climbing 3-5 steps with a railing? : A Lot 6 Click Score: 15    End of Session Equipment Utilized During Treatment: Gait belt;Oxygen Activity Tolerance: Other (comment) (limited by dizziness) Patient left: in bed;with call bell/phone within reach;with bed alarm set Nurse Communication: Mobility status PT Visit Diagnosis: Unsteadiness on feet (R26.81);Other abnormalities of gait and mobility (R26.89);Muscle weakness (generalized)  (M62.81);Difficulty in walking, not elsewhere classified (R26.2);Dizziness and giddiness (R42);History of falling (Z91.81)     Time: 4268-3419 PT Time Calculation (min) (ACUTE ONLY): 15 min  Charges:  $Therapeutic Exercise: 8-22 mins                     Aida Raider, PT  Office # [phone] Pager [phone]    Ilda Foil 04/23/2021, 1:55 PM

## 2021-04-23 NOTE — Progress Notes (Signed)
Tried to ambulate patient with nurse tech to [facility]. Patient got extremely dizzy and said the room was spinning when she sat up in bed. She said she felt like she would fall if she stood up. Pt laid back down and put on bedpan instead.   I also gave PRN Meclizine.

## 2021-04-24 ENCOUNTER — Telehealth: Payer: Self-pay

## 2021-04-24 ENCOUNTER — Encounter: Payer: Self-pay | Admitting: *Deleted

## 2021-04-24 DIAGNOSIS — J9611 Chronic respiratory failure with hypoxia: Secondary | ICD-10-CM | POA: Diagnosis not present

## 2021-04-24 DIAGNOSIS — I5033 Acute on chronic diastolic (congestive) heart failure: Secondary | ICD-10-CM | POA: Diagnosis not present

## 2021-04-24 DIAGNOSIS — I69391 Dysphagia following cerebral infarction: Secondary | ICD-10-CM | POA: Diagnosis not present

## 2021-04-24 DIAGNOSIS — R001 Bradycardia, unspecified: Secondary | ICD-10-CM | POA: Diagnosis not present

## 2021-04-24 DIAGNOSIS — E039 Hypothyroidism, unspecified: Secondary | ICD-10-CM

## 2021-04-24 LAB — BASIC METABOLIC PANEL
Anion gap: 12 (ref 5–15)
BUN: 12 mg/dL (ref 8–23)
CO2: 39 mmol/L — ABNORMAL HIGH (ref 22–32)
Calcium: 10.4 mg/dL — ABNORMAL HIGH (ref 8.9–10.3)
Chloride: 72 mmol/L — ABNORMAL LOW (ref 98–111)
Creatinine, Ser: 0.62 mg/dL (ref 0.44–1.00)
GFR, Estimated: >60 mL/min (ref >60)
Glucose, Bld: 112 mg/dL — ABNORMAL HIGH (ref 70–99)
Potassium: 4.3 mmol/L (ref 3.5–5.1)
Sodium: 123 mmol/L — ABNORMAL LOW (ref 135–145)

## 2021-04-24 LAB — CBC
HCT: 28.5 % — ABNORMAL LOW (ref 36.0–46.0)
Hemoglobin: 9.2 g/dL — ABNORMAL LOW (ref 12.0–15.0)
MCH: 28 pg (ref 26.0–34.0)
MCHC: 32.3 g/dL (ref 30.0–36.0)
MCV: 86.9 fL (ref 80.0–100.0)
Platelets: 242 10*3/uL (ref 150–400)
RBC: 3.28 MIL/uL — ABNORMAL LOW (ref 3.87–5.11)
RDW: 13.9 % (ref 11.5–15.5)
WBC: 9.9 10*3/uL (ref 4.0–10.5)
nRBC: 0.2 % (ref 0.0–0.2)

## 2021-04-24 MED ORDER — HYDRALAZINE HCL 50 MG PO TABS
50.0000 mg | ORAL_TABLET | Freq: Three times a day (TID) | ORAL | Status: AC
Start: 2021-04-24 — End: ?

## 2021-04-24 MED ORDER — BISACODYL 10 MG RE SUPP
10.0000 mg | Freq: Once | RECTAL | Status: AC
  Administered 2021-04-24: 10 mg via RECTAL
  Filled 2021-04-24: qty 1

## 2021-04-24 MED ORDER — SODIUM CHLORIDE 1 G PO TABS: 2.0000 g | ORAL_TABLET | Freq: Two times a day (BID) | ORAL | 6 refills | Status: AC

## 2021-04-24 NOTE — Progress Notes (Signed)
Occupational Therapy Treatment Patient Details Name: Tammy Fernandez MRN: [id number] DOB: Dec 06, 1923 Today's Date: 04/24/2021    History of present illness 87 y.o. female  who presents for evaluation of hypotension and bradycardia 04/19/21.  Per son, patient was seen by her pulmonologist yesterday and was found to have hyponatremia of 125 and elevated CO2 of 44. While in ED HR dropped to 38bpm, O2 levels continued to drop on increasing levels of supplemental O2 transferred to the ICU overnight with encephalopathy and acute on chronic hypercarbic/hypoxemic respiratory failure placed on BiPAP PMH: CVA, diastolic heart failure, hypothyroidism, chronic hyponatremia, external hemorrhoids, hyperlipidemia and hypertension   OT comments  Pt making slow progress with functional goals. Session focused on sit - stand transitions for recliner to RW, SPTs tp BSC, toileting tasks hygiene standing at RW. Pt required encouragement to initiate mobility, stating, " I can't do it, I can;t do anything". Pt's son present and was very encouraging. OT will continue to follow acutely to maximize level of function and safety  Follow Up Recommendations  Home health OT;Supervision - Intermittent    Equipment Recommendations  None recommended by OT    Recommendations for Other Services      Precautions / Restrictions Precautions Precautions: Fall Restrictions Weight Bearing Restrictions: No       Mobility Bed Mobility Overal bed mobility: Needs Assistance Bed Mobility: Supine to Sit     Supine to sit: Min assist     General bed mobility comments: pt in recliner upon arrival    Transfers Overall transfer level: Needs assistance Equipment used: Rolling walker (2 wheeled) Transfers: Sit to/from [employer] Sit to Stand: Min assist Stand pivot transfers: Min assist       General transfer comment: Cues for hand placment. Min A for powering up to stand and steadying herself once in  standing. Pt with flexed posture flexed at trunk and knees    Balance Overall balance assessment: Needs assistance Sitting-balance support: Feet unsupported;No upper extremity supported Sitting balance-[name]: Fair Sitting balance - Comments: Pt kicks her feet up when sitting and maintains balance, can not tolerate any challenges to static sitting.  With fatigue, leans to right and needs left UE to support herself. Also helped to prop on left elbow for a bit.   Standing balance support: Bilateral upper extremity supported;During functional activity Standing balance-[name]: Poor Standing balance comment: Needs asssistance standing and steadying with bil UE support and external support                           ADL either performed or assessed with clinical judgement   ADL Overall ADL's : Needs assistance/impaired     Grooming: Wash/dry hands;Wash/dry face;Minimal assistance;Standing Grooming Details (indicate cue type and reason): heavy min A to steady pt at [employer]     Lower Body Bathing: Maximal assistance Lower Body Bathing Details (indicate cue type and reason): Max A when pt standing to complete pericare         Toilet Transfer: Minimal assistance;Stand-pivot;BSC;Cueing for safety;RW   Toileting- Clothing Manipulation and Hygiene: Maximal assistance;Sitting/lateral lean;Sit to/from stand       Functional mobility during ADLs: Minimal assistance;Rolling walker;Cueing for safety       Vision Baseline Vision/History: Macular Degeneration;Wears glasses Wears Glasses: At all times Patient Visual Report: No change from baseline Vision Assessment?: No apparent visual deficits   Perception     Praxis      Cognition Arousal/Alertness: Awake/alert  Behavior During Therapy: WFL for tasks assessed/performed Overall Cognitive Status: Within Functional Limits for tasks assessed                                 General Comments: not sure of time  but otherwise oriented, perseverates on her vitals with movement        Exercises Exercises: General Lower Extremity General Exercises - Lower Extremity Ankle Circles/Pumps: Both;AROM;10 reps Long Arc Quad: AROM;Both;10 reps;Seated Heel Slides: AROM;Right;Left;5 reps   Shoulder Instructions       General Comments Pt on 2L O2 with sat 95% at rest.  Dropped to 82% with transfer and had to incr to 3L to keep sats >90%. Nurse aware that PT left the O2 on 3L and will adjust accordingly.    Pertinent Vitals/ Pain       Pain Assessment: No/denies pain  Home Living                                          Prior Functioning/Environment              Frequency  Min 2X/week        Progress Toward Goals  OT Goals(current goals can now be found in the care plan section)  Progress towards OT goals: Progressing toward goals  Acute Rehab OT Goals Patient Stated Goal: not stated  Plan Discharge plan remains appropriate    Co-evaluation                 AM-PAC OT "6 Clicks" Daily Activity     Outcome Measure   Help from another person eating meals?: None (set up) Help from another person taking care of personal grooming?: A Little Help from another person toileting, which includes using toliet, bedpan, or urinal?: A Lot Help from another person bathing (including washing, rinsing, drying)?: A Lot Help from another person to put on and taking off regular upper body clothing?: A Little Help from another person to put on and taking off regular lower body clothing?: A Lot 6 Click Score: 16    End of Session Equipment Utilized During Treatment: Rolling walker;Oxygen;Other (comment) (BSC)  OT Visit Diagnosis: Unsteadiness on feet (R26.81);Other abnormalities of gait and mobility (R26.89);Muscle weakness (generalized) (M62.81)   Activity Tolerance Patient limited by fatigue   Patient Left in chair;with call bell/phone within reach;with family/visitor  present;with chair alarm set   Nurse Communication          Time: 4132-4401 OT Time Calculation (min): 20 min  Charges: OT General Charges $OT Visit: 1 Visit OT Treatments $Therapeutic Activity: 8-22 mins     Galen Manila 04/24/2021, 1:55 PM

## 2021-04-24 NOTE — Care Management Important Message (Signed)
Important Message  Patient Details  Name: Tammy Fernandez MRN: [id number] Date of Birth: [birth year]   Medicare Important Message Given:  Yes     Renie Ora 04/24/2021, 10:22 AM

## 2021-04-24 NOTE — Progress Notes (Signed)
NAME:  Tammy Fernandez, MRN:  [id number], DOB:  1924/06/05, LOS: 4 ADMISSION DATE:  04/19/2021, CONSULTATION DATE:  5/19 REFERRING MD:  [facility], CHIEF COMPLAINT:  Lethargy, weakness   Brief History of Present Illness:  87 y/o female admitted on 5/19 with acute on chronic respiratory failure with hypercarbia in setting of benzodiazepine use and non-compliance with BIPAP.  Followed by Dr. Vassie Loll in the pulmonary clinic at baseline. Palliative care consulted, patient's family wants home health, no home hospice.   Pertinent  Medical History  GERD Depression Chronic low back pain Diastolic CHF Fatty liver Hyperlipidemia Hypertension Hyponatremia NSTEMI PFO  Severe pulmonary arterial hypertension Chronic respiratory failure with hypercarbia  Significant Hospital Events: Including procedures, antibiotic start and stop dates in addition to other pertinent events    5/18 admission to [facility]  5/19 PCCM consultation for worsening lethargy, ABG showed pco2 of 90. Started on bipap and pressors overnight.  5/19 off pressors and BiPAP this morning, on HFNC, Transferred to the medical floor  5/22 patient's son told palliative wants home health, not hospice, palliative signed off  Interim History / Subjective:  Feels tired Notes sinus congestion at home keeps her from using her NIMV mask  Objective   Blood pressure (!) 156/66, pulse 88, temperature 98.2 F (36.8 C), temperature source Oral, resp. rate 18, height 5\' 2"  (1.575 m), weight 76 kg, SpO2 95 %.        Intake/Output Summary (Last 24 hours) at 04/24/2021 04/26/2021 Last data filed at 04/23/2021 1100 Gross per 24 hour  Intake 200 ml  Output --  Net 200 ml   Filed Weights   04/19/21 1426 04/20/21 0523  Weight: 79.4 kg 76 kg    Examination:  General: Frail, elderly female resting comfortably in bed HENT: NCAT OP clear PULM: CTA B, normal effort CV: RRR, no mgr GI: BS+, soft, nontender MSK: normal bulk and tone Neuro: awake,  alert, no distress, MAEW   Labs/imaging that I havepersonally reviewed  (right click and "Reselect all SmartList Selections" daily)  Na 122, Cr OK 5/19 ABG: 7/52/52/148  Resolved Hospital Problem list   Acute metabolic encephalopathy due to hypercarbia and benzodiazapine overdose  Assessment & Plan:  Acute on chronic respiratory failure with hypercarbia and hypoxemia Has daytime resting hypercarbic respiratory failure due to diaphragm weakness, has been on NIMV at home since 2020 but has not been compliant Decompenseated HFpEF Chronic hyponatremia Allergic rhinitis Hypothyroidism -no benzo -needs to be compliant with NIMV, stressed this at length today with the patient -continue claritin and flonase -could consider adding astelin as outpatient if still struggles with sinus congestion -palliative care note reviewed, appreciate their support.  Would benefit from transitional hospice program as outpatient if family agreeable, but for now will continue with plans for home health -f/u with pulmonary in 2-4 weeks, will contact office.    Best practice (right click and "Reselect all SmartList Selections" daily)   Per [facility]  Labs   CBC: Recent Labs  Lab 04/19/21 1531 04/20/21 0110 04/20/21 0139 04/22/21 0146 04/23/21 0320  WBC 5.6 4.2  --  7.6 9.9  NEUTROABS 4.2 2.8  --   --   --   HGB 8.1* 7.9* 8.8* 9.4* 9.5*  HCT 26.7* 25.9* 26.0* 29.3* 29.0*  MCV 93.0 92.5  --  86.2 86.1  PLT 198 224  --  272 319    Basic Metabolic Panel: Recent Labs  Lab 04/19/21 1851 04/20/21 0139 04/20/21 0215 04/21/21 1126 04/22/21 0146 04/23/21 0320  NA 125* 124* 125* 123* 122* 122*  K 5.2* 4.8 5.6* 3.6 3.8 5.2*  CL 80*  --  79* 70* 69* 72*  CO2 39*  --  39* 43* 42* 42*  GLUCOSE 107*  --  89 154* 104* 94  BUN 18  --  21 14 14 13   CREATININE 0.57  --  0.71 0.54 0.58  CALCIUM 9.7  --  9.4 9.7 9.9 10.1   GFR: Estimated Creatinine Clearance: 41.1 mL/min (by C-G formula based on SCr of  0.58 mg/dL). Recent Labs  Lab 04/19/21 1531 04/20/21 0110 04/22/21 0146 04/23/21 0320  WBC 5.6 4.2 7.6 9.9    Liver Function Tests: Recent Labs  Lab 04/19/21 1531  AST 22  ALT 11  ALKPHOS 69  BILITOT 0.4  PROT 6.5  ALBUMIN 2.8*   No results for input(s): LIPASE, AMYLASE in the last 168 hours. No results for input(s): AMMONIA in the last 168 hours.  ABG    Component Value Date/Time   PHART 7.520 (H) 04/20/2021 0534   PCO2ART 52.0 (H) 04/20/2021 0534   PO2ART 148 (H) 04/20/2021 0534   HCO3 42.2 (H) 04/20/2021 0534   TCO2 49 (H) 04/20/2021 0139   O2SAT 99.5 04/20/2021 0534     Coagulation Profile: No results for input(s): INR, PROTIME in the last 168 hours.  Cardiac Enzymes: No results for input(s): CKTOTAL, CKMB, CKMBINDEX, TROPONINI in the last 168 hours.  HbA1C: Hgb A1c MFr Bld  Date/Time Value Ref Range Status  09/22/2020 03:17 PM 5.4 4.8 - 5.6 % Final    Comment:             Prediabetes: 5.7 - 6.4          Diabetes: >6.4          Glycemic control for adults with diabetes: <7.0   10/31/2015 06:07 AM 7.2 (H) 4.8 - 5.6 % Final    Comment:    (NOTE)         Pre-diabetes: 5.7 - 6.4         Diabetes: >6.4         Glycemic control for adults with diabetes: <7.0     CBG: Recent Labs  Lab 04/20/21 0519  GLUCAP 92     Critical care time: n/a     04/22/21, MD [place] [facility] Pager: [phone] Cell: [phone] If no response, please call [phone] until 7pm After 7:00 pm call Elink  [phone]

## 2021-04-24 NOTE — Consult Note (Signed)
   [facility] Inpatient Consult   04/24/2021  Tammy Fernandez 12/17/1923 [id number]  Patient Insurance: [employer] Medicare  Primary Care Provider: Annita Brod, MD verified by patient's son, [name]. Ames Dura, NP is listed but is a NP at the [facility] Pulmonary office  Patient was reviewed for post hospital follow up and confirmed patient's primary care provider is not a Facilities manager. Spoke with patient and son, patient acknowledged [ head nod yes] and allowed communication from son. [name] states that patient was receiving services from Dr. Darleene Cleaver for home visits and that her last primary care provider's office closed at [facility] with Dr. Lavetta Nielsen before the office closed.  He states he and his family is trying to decide what would be best for their mom going forward.  He verbalized understanding that Dr. Darleene Cleaver is not a [facility] provider.  Plan: Follow   For questions, please contact:  [name], RN BSN CCM [facility]  [phone] business mobile phone Toll free office [phone]  Fax number: [phone] [name]

## 2021-04-24 NOTE — TOC Initial Note (Addendum)
Transition of Care [facility]) - Initial/Assessment Note    Patient Details  Name: Tammy Fernandez MRN: [id number] Date of Birth: Jul 26, 1924  Transition of Care [facility]) CM/SW Contact:    Kingsley Plan, RN Phone Number: 04/24/2021, 11:32 AM  Clinical Narrative:                 Spoke to patient and her son [name] at bedside. [name] answered all questions, patient nodded yes to Jerome's answers, but did not speak.   Possible discharge tomorrow. [name] and patient aware.   [name] confirmed face sheet information. Patient will need ambulance transport home.   Patient has oxygen and Triology at home already with [facility]. [name] states she has concentrator , but he is requesting more tanks and a face mask for Triology . NCM spoke to [facility] with [facility], they will call [name] directly regarding same. Jerome aware.   Patient also has 3 in1 at home already.   Patient already active with [facility] for Home health. [name] wants to continue with [facility]. Misty Stanley with [facility] aware and has orders to resume care.   Patient already has hospital bed and wheelchair  1530 Patient being discharged today. NCM spoke to [place] and made aware. Family wants patient to have BM prior to discharge. NCM left PTAR paperwork at 6E nurses station with number for nurse to call when ready. Expected Discharge Plan: Home w Home Health Services Barriers to Discharge: Continued Medical Work up   Patient Goals and CMS Choice Patient states their goals for this hospitalization and ongoing recovery are:: to return to home CMS Medicare.gov Compare Post Acute Care list provided to:: Patient Represenative (must comment) [name] son at bedside. Patient nodded yes) Choice offered to / list presented to : Patient,Adult Children  Expected Discharge Plan and Services Expected Discharge Plan: Home w Home Health Services In-house Referral: Clinical Social Work Discharge Planning Services: CM Consult Post Acute Care  Choice: Home Health,Durable Medical Equipment Living arrangements for the past 2 months: Single Family Home                 DME Arranged:  (see note) DME Agency: [facility] Date DME Agency Contacted: 04/24/21 Time DME Agency Contacted: [id number] Representative spoke with at DME Agency: Velna Hatchet HH Arranged: RN,PT HH Agency: [facility] Date [facility] Agency Contacted: 04/24/21 Time HH Agency Contacted: 1132 Representative spoke with at [facility] Agency: Misty Stanley  Prior Living Arrangements/Services Living arrangements for the past 2 months: Single Family Home Lives with:: Adult Children Patient language and need for interpreter reviewed:: Yes Do you feel safe going back to the place where you live?: Yes      Need for Family Participation in Patient Care: Yes (Comment) Care giver support system in place?: Yes (comment) Current home services: DME Criminal Activity/Legal Involvement Pertinent to Current Situation/Hospitalization: No - Comment as needed  Activities of Daily Living Home Assistive Devices/Equipment: Oxygen,Walker (specify type) ADL Screening (condition at time of admission) Patient's cognitive ability adequate to safely complete daily activities?: Yes Is the patient deaf or have difficulty hearing?: Yes Does the patient have difficulty seeing, even when wearing glasses/contacts?: Yes Does the patient have difficulty concentrating, remembering, or making decisions?: No Patient able to express need for assistance with ADLs?: Yes Does the patient have difficulty dressing or bathing?: Yes Independently performs ADLs?: No Dressing (OT): Needs assistance Is this a change from baseline?: Pre-admission baseline Grooming: Needs assistance Is this a change from baseline?: Pre-admission baseline Feeding: Independent Bathing: Needs  assistance Is this a change from baseline?: Pre-admission baseline Toileting: Needs assistance Is this a change from baseline?: Pre-admission baseline In/Out Bed:  Needs assistance Is this a change from baseline?: Pre-admission baseline Walks in Home: Needs assistance Is this a change from baseline?: Pre-admission baseline Does the patient have difficulty walking or climbing stairs?: Yes Weakness of Legs: Both Weakness of Arms/Hands: Both  Permission Sought/Granted Permission sought to share information with : Case Manager Permission granted to share information with : Yes, Verbal Permission Granted     Permission granted to share info w AGENCY: [facility]        Emotional Assessment Appearance:: Appears stated age Attitude/Demeanor/Rapport: Gracious Affect (typically observed): Accepting Orientation: : Oriented to Self,Oriented to Place,Oriented to Situation Alcohol / Substance Use: Not Applicable Psych Involvement: No (comment)  Admission diagnosis:  Bradycardia [R00.1] SOB (shortness of breath) [R06.02] Hypoxia [R09.02] Acute on chronic diastolic congestive heart failure (HCC) [I50.33] (HFpEF) heart failure with preserved ejection fraction (HCC) [I50.30] Anemia, unspecified type [D64.9] Patient Active Problem List   Diagnosis Date Noted  . (HFpEF) heart failure with preserved ejection fraction (HCC) 04/20/2021  . SOB (shortness of breath)   . Bradycardia 04/19/2021  . Parkinson's disease (HCC) 09/22/2020  . Chronic sinusitis 06/01/2020  . OSA on CPAP 06/01/2020  . SIADH (syndrome of inappropriate ADH production) (HCC) 02/09/2020  . Pressure injury of skin 01/08/2019  . Hypochloremic alkalosis 12/27/2018  . Oxygen dependent 06/10/2018  . Chest pain, atypical 05/23/2018  . Chronic respiratory failure with hypoxia (HCC) 05/23/2018  . Long term prescription benzodiazepine use 04/23/2018  . Hyperlipidemia   . Acute on chronic diastolic congestive heart failure (HCC) 12/25/2017  . Severe pulmonary arterial systolic hypertension (HCC) 12/25/2017  . Essential hypertension 08/29/2016  . Hypothyroidism 08/29/2016  . Abnormal EKG   .  Elevated troponin   . NSTEMI (non-ST elevated myocardial infarction) (HCC) 08/28/2016  . Decubitus ulcer of sacral region, stage 3 (HCC) 12/18/2015  . Klebsiella cystitis 12/16/2015  . Acute delirium 12/16/2015  . Aspiration pneumonia (HCC) 12/16/2015  . Leukocytosis   . Hypokalemia   . Encounter for central line placement   . Anxiety state   . Essential hypertension   . Physical deconditioning   . Altered mental status   . Chronic diarrhea   . Hyponatremia   . History of CVA (cerebrovascular accident) 01/19/2014  . Dysphagia, post-stroke 01/15/2014  . Bacteremia due to Staphylococcus 01/13/2014  . Left hemiplegia (HCC) 01/12/2014  . Hypertension 01/11/2014  . Acute hyponatremia 01/11/2014  . Hyperkalemia 01/11/2014  . Hematuria 01/11/2014  . E. coli UTI (urinary tract infection) 01/11/2014  . GERD (gastroesophageal reflux disease) 11/14/2011   PCP:  Glenford Bayley, NP Pharmacy:   [id number] - [place], [place] - [address] [facility] [address] [facility] [address] [place] [address] Phone: [phone] Fax: [phone]     Social Determinants of Health (SDOH) Interventions    Readmission Risk Interventions No flowsheet data found.

## 2021-04-24 NOTE — Telephone Encounter (Signed)
Received Quarry manager. download placed in Dr Reginia Naas folder.

## 2021-04-24 NOTE — Progress Notes (Signed)
Physical Therapy Treatment Patient Details Name: Tammy Fernandez MRN: [id number] DOB: Nov 19, 1924 Today's Date: 04/24/2021    History of Present Illness 87 y.o. female  who presents for evaluation of hypotension and bradycardia 04/19/21.  Per son, patient was seen by her pulmonologist yesterday and was found to have hyponatremia of 125 and elevated CO2 of 44. While in ED HR dropped to 38bpm, O2 levels continued to drop on increasing levels of supplemental O2 transferred to the ICU overnight with encephalopathy and acute on chronic hypercarbic/hypoxemic respiratory failure placed on BiPAP PMH: CVA, diastolic heart failure, hypothyroidism, chronic hyponatremia, external hemorrhoids, hyperlipidemia and hypertension    PT Comments    Pt admitted with above diagnosis. Pt was able to pivot from bed to chair with min assist and cues with RW.  Pt fatigues quickly and has poor overall postural stability, however did tolerate the transfer with assist of 1 person.  Pt keeps head rotated and side flexed to her right and its difficult to assist pt to midline even with incr stretching.  Pt unable to complete any gaze stability exercises due to stiffness in neck. Left chat for MD regarding pts' limitations for vestibular therapy and use of Antivert.  See orthostatic VS below.  Difficult to monitor for pts tolerance for standing.  Son states that pt will need Rehab prior to going home as there will be family to assist pt however she needs to be stronger.  Updated d/c plan to SNF and 2 x week per standards of care.   Pt currently with functional limitations due to balance and endurance deficits. Pt will benefit from skilled PT to increase their independence and safety with mobility to allow discharge to the venue listed below.   Orthostatic BPs  Supine 182/81, 83bpm  Sitting 173/76, 88 bpm  Standing 142/113, 83 bpm  Would not register in standing at 3 minutes.  Once sitting in chair pt BP 164/62 with HR 87 bpm.     Follow Up Recommendations  Supervision/Assistance - 24 hour;SNF     Equipment Recommendations  None recommended by PT    Recommendations for Other Services OT consult     Precautions / Restrictions Precautions Precautions: Fall Restrictions Weight Bearing Restrictions: No    Mobility  Bed Mobility Overal bed mobility: Needs Assistance Bed Mobility: Supine to Sit     Supine to sit: Min assist     General bed mobility comments: min A for guiding arm across body to bedrail, and for scooting hips to EoB    Transfers Overall transfer level: Needs assistance Equipment used: Rolling walker (2 wheeled) Transfers: Sit to/from [employer] Sit to Stand: Min assist Stand pivot transfers: Min assist       General transfer comment: Min A for powering up to stand and steadying herself once in standing.  Pt was able to take pivotal steps to chair with incr time and constant cues to sequence steps.  Pt with flexed posture flexed at trunk and knees.  Leans right as well.  Ambulation/Gait                 Stairs             Wheelchair Mobility    Modified Rankin (Stroke Patients Only)       Balance Overall balance assessment: Needs assistance Sitting-balance support: Feet unsupported;No upper extremity supported Sitting balance-[name]: Fair Sitting balance - Comments: Pt kicks her feet up when sitting and maintains balance, can not tolerate any  challenges to static sitting.  With fatigue, leans to right and needs left UE to support herself. Also helped to prop on left elbow for a bit.   Standing balance support: Bilateral upper extremity supported Standing balance-[name]: Poor Standing balance comment: Needs asssistance standing and steadying with bil UE support and external support                            Cognition Arousal/Alertness: Awake/alert Behavior During Therapy: WFL for tasks assessed/performed Overall  Cognitive Status: Within Functional Limits for tasks assessed                                 General Comments: not sure of time but otherwise oriented, perseverates on her vitals with movement      Exercises General Exercises - Lower Extremity Ankle Circles/Pumps: Both;AROM;10 reps Long Arc Quad: AROM;Both;10 reps;Seated Heel Slides: AROM;Right;Left;5 reps    General Comments General comments (skin integrity, edema, etc.): Pt on 2L O2 with sat 95% at rest.  Dropped to 82% with transfer and had to incr to 3L to keep sats >90%. Nurse aware that PT left the O2 on 3L and will adjust accordingly.      Pertinent Vitals/Pain Pain Assessment: No/denies pain    Home Living                      Prior Function            PT Goals (current goals can now be found in the care plan section) Acute Rehab PT Goals Patient Stated Goal: not stated Progress towards PT goals: Progressing toward goals    Frequency    Min 2X/week      PT Plan Discharge plan needs to be updated;Frequency needs to be updated    Co-evaluation              AM-PAC PT "6 Clicks" Mobility   Outcome Measure  Help needed turning from your back to your side while in a flat bed without using bedrails?: A Little Help needed moving from lying on your back to sitting on the side of a flat bed without using bedrails?: A Little Help needed moving to and from a bed to a chair (including a wheelchair)?: A Little Help needed standing up from a chair using your arms (e.g., wheelchair or bedside chair)?: A Little Help needed to walk in hospital room?: A Lot Help needed climbing 3-5 steps with a railing? : A Lot 6 Click Score: 16    End of Session Equipment Utilized During Treatment: Gait belt;Oxygen Activity Tolerance: Other (comment);Patient limited by fatigue (limited by dizziness) Patient left: with call bell/phone within reach;in chair;with chair alarm set;with family/visitor  present Nurse Communication: Mobility status (O2 left on 3L) PT Visit Diagnosis: Unsteadiness on feet (R26.81);Other abnormalities of gait and mobility (R26.89);Muscle weakness (generalized) (M62.81);Difficulty in walking, not elsewhere classified (R26.2);Dizziness and giddiness (R42);History of falling (Z91.81)     Time: 1115-5208 PT Time Calculation (min) (ACUTE ONLY): 42 min  Charges:  $Therapeutic Exercise: 8-22 mins $Therapeutic Activity: 8-22 mins $Self Care/Home Management: 8-22                     [facility] M,PT Acute Rehab Services [phone] (pager)   Bevelyn Buckles 04/24/2021, 11:02 AM

## 2021-04-24 NOTE — Progress Notes (Signed)
RT note. Patient currently waiting on discharge. Patient currently on [place], VSS and not needing bipap at this time. v60 is in patients room if anything changes. RT will continue to monitor

## 2021-04-24 NOTE — Telephone Encounter (Addendum)
Next Appt With Pulmonology Kathy Breach V. Vassie Loll, MD) 05/04/2021 at 2:00 PM

## 2021-04-24 NOTE — Discharge Summary (Signed)
Physician Discharge Summary  Tammy Fernandez WGN:[id number] DOB: [birth year] DOA: 04/19/2021  PCP: Glenford Bayley, NP  Admit date: 04/19/2021 Discharge date: 04/24/2021  Admitted From: Home Disposition: Home  Recommendations for Outpatient Follow-up:  1. Follow up with PCP in 1-2 weeks 2. Please obtain BMP/CBC in one week 3. Please follow up with pulmonology and cardiology as scheduled:  Home Health: PT Equipment/Devices: No new equipment required  Discharge Condition: Guarded CODE STATUS: Full Diet recommendation: Dysphagia 1 diet thin liquids, low-fat low-salt  Brief/Interim Summary: 87 year old woman with increasing lethargy due to hypercarbic respiratory failure. Followed by Dr Vassie Loll for chronic hypercarbic respiratory failure due to elevated hemidiaphragm - possible chest wall restriction. Associated chronic hyponatremia in the 125 range due to persistent compensatory metabolic alkalosis. Admitted 5/18 to Hospitalist for hypotension and bradycardia. She had been experiencing weakness and decreased mobility. Beta-blocker was held and patient was continued on diuretic. Patient has become more lethargic, pCO2 elevated at 90. PCCM consulted.  Patient admitted as above with acute encephalopathy likely combination of toxic encephalopathy from possible overdose/polypharmacy with home benzodiazepines as well as likely metabolic encephalopathy in the setting of hypercarbic state due to noncompliance with respiratory equipment.  Patient was initially admitted to the ICU for close monitoring, positive pressure ventilation in setting of hypercarbia and metabolic encephalopathy.  Fortunately as patient was given medication holiday as well as aggressive treatment for hypercarbia her mental status resolved back to baseline per family at bedside approximately 2 to 3 days ago.  Patient remains markedly weak, she remains somewhat dizzy with positional changes and attempts to stand but is not orthostatic,  if anything she remains hypertensive with ambulation and standing.  Patient does have profound anxiety, she continues to request, if not demand, being resumed on her home benzodiazepines which we discussed at her age was likely one of the main reasons she was hospitalized to begin with.  We had lengthy discussions all weekend between myself, pulmonology, palliative care and case management with patient and multiple family members.  We discussed given her profound weakness she would be a high risk for discharge home at which time family requested for palliative follow-up, despite further discussion with palliative family requested that we move forward with ongoing aggressive medical care.  As such we recommended discharge to SNF for ongoing physical therapy and rehab needs given her profound ambulatory dysfunction even compared to her previous baseline at home which was moderately poor at best requiring assistance from family just to get to bedside commode.  Patient now with PT barely able to sidestep to bedside chair without moderate to max assist.  Patient and family continue to request discharge home which given her resolution of her acute medical conditions including mental status changes, hypercarbia and likely polypharmacy she is otherwise stable and agreeable for discharge home.  Lengthy discussion again today about patient's full CODE STATUS and at her age with her chronic comorbid conditions chest compressions and invasive ventilation would carry many risks for the patient and would not resolve or improve her baseline chronic conditions as outlined above.  We continue to impress upon the family the importance of safe disposition and again recommended SNF placement which family declined requesting discharge home with home therapy.  Close follow-up with PCP, cardiology and pulmonology as scheduled in the next few weeks.  Medication changes outlined below most notably cessation of her propranolol and  benzodiazepines which we would recommend to refrain from restarting on the patient and would recommend alternative anxiolytic  in the near future.   Discharge Diagnoses:  Active Problems:   GERD (gastroesophageal reflux disease)   Hypertension   Dysphagia, post-stroke   Hyponatremia   Hypothyroidism   Acute on chronic diastolic congestive heart failure (HCC)   Chronic respiratory failure with hypoxia (HCC)   Bradycardia   (HFpEF) heart failure with preserved ejection fraction (HCC)   SOB (shortness of breath)    Discharge Instructions  Discharge Instructions    Call MD for:  difficulty breathing, headache or visual disturbances   Complete by: As directed    Call MD for:  extreme fatigue   Complete by: As directed    Call MD for:  persistant dizziness or light-headedness   Complete by: As directed    Diet - low sodium heart healthy   Complete by: As directed    Increase activity slowly   Complete by: As directed      Allergies as of 04/24/2021      Reactions   Crestor [rosuvastatin Calcium] Other (See Comments)   Muscle Aches   Keflex [cephalexin] Other (See Comments)   Dizziness   Peanut Butter Flavor Hives   Sulfa Antibiotics Hives, Rash   Cephalexin Other (See Comments)   Dizziness   Peanut-containing Drug Products Hives   Rosuvastatin Other (See Comments)   Muscle aches   Sulfa Drugs Cross Reactors Hives, Rash      Medication List    STOP taking these medications   ALPRAZolam 1 MG tablet Commonly known as: XANAX   propranolol ER 60 MG 24 hr capsule Commonly known as: INDERAL LA     TAKE these medications   albuterol (2.5 MG/3ML) 0.083% nebulizer solution Commonly known as: PROVENTIL USE VIA NEBULIZER EVERY 4 HOURS AS NEEDED FOR SHORTNESS OF BREATH OR WHEEZING What changed:   how much to take  how to take this  when to take this  reasons to take this  additional instructions   Align 4 MG Caps Take 4 mg by mouth in the morning.    amLODipine 10 MG tablet Commonly known as: NORVASC TAKE 1 TABLET(10 MG) BY MOUTH DAILY What changed:   how much to take  how to take this  when to take this  additional instructions   Aspirin 81 MG EC tablet Take 81 mg by mouth in the morning.   azelastine 0.1 % nasal spray Commonly known as: ASTELIN Place 1 spray into both nostrils 2 (two) times daily. Use in each nostril as directed   cetirizine 10 MG tablet Commonly known as: ZYRTEC Take 10 mg by mouth daily.   clotrimazole-betamethasone cream Commonly known as: LOTRISONE Apply 1 application topically 2 (two) times daily. What changed:   when to take this  reasons to take this   demeclocycline 300 MG tablet Commonly known as: DECLOMYCIN Take 0.5 tablets (150 mg total) by mouth 2 (two) times daily.   diclofenac sodium 1 % Gel Commonly known as: VOLTAREN Apply 2 g topically 4 (four) times daily.   famotidine 20 MG tablet Commonly known as: PEPCID Take 1 tablet (20 mg total) by mouth 2 (two) times daily.   fluticasone 50 MCG/ACT nasal spray Commonly known as: FLONASE SHAKE LIQUID AND USE 1 SPRAY IN EACH NOSTRIL TWICE DAILY What changed: See the new instructions.   glycerin adult 2 g suppository Place 1 suppository rectally as needed for constipation.   guaiFENesin 600 MG 12 hr tablet Commonly known as: MUCINEX Take 1 tablet (600 mg total) by mouth 2 (  two) times daily as needed for to loosen phlegm. What changed:   when to take this  additional instructions   hydrALAZINE 50 MG tablet Commonly known as: APRESOLINE Take 1 tablet (50 mg total) by mouth 3 (three) times daily. What changed:   medication strength  See the new instructions.   ipratropium 0.03 % nasal spray Commonly known as: ATROVENT USE 2 SPRAYS IN EACH NOSTRIL TWICE DAILY   lactose free nutrition Liqd Take 237 mLs by mouth 2 (two) times daily between meals.   levothyroxine 50 MCG tablet Commonly known as: SYNTHROID TAKE 1  TABLET(50 MCG) BY MOUTH DAILY BEFORE BREAKFAST What changed:   how much to take  how to take this  when to take this  additional instructions   meclizine 12.5 MG tablet Commonly known as: ANTIVERT Take 1 tablet (12.5 mg total) by mouth 3 (three) times daily as needed for dizziness.   Nebulizer Devi Use with nebulized albuterol solution as needed for wheezing, cough or shortness of breath   olopatadine 0.1 % ophthalmic solution Commonly known as: PATANOL Place 1 drop into both eyes daily as needed for allergies. What changed: when to take this   omeprazole 20 MG capsule Commonly known as: PRILOSEC Take 20 mg by mouth 2 (two) times daily.   oxybutynin 5 MG tablet Commonly known as: DITROPAN Take 5 mg by mouth at bedtime.   OXYGEN Inhale 1.5-2 L/min into the lungs continuous.   polyethylene glycol powder 17 GM/SCOOP powder Commonly known as: GLYCOLAX/MIRALAX Take 17 g by mouth in the morning.   senna 8.6 MG Tabs tablet Commonly known as: SENOKOT Take 1 tablet (8.6 mg total) by mouth daily.   sodium chloride 1 g tablet Take 2 tablets (2 g total) by mouth 2 (two) times daily.       Follow-up Information    [facility] Follow up.   Specialty: Home Health Services [facility] services for physical therapy and registered nursing.  They will call you in the next 24-48 hours after discharge from the hospital to set up continued services. Contact information: [address] [place] [address] [phone]              Allergies  Allergen Reactions  . Crestor [Rosuvastatin Calcium] Other (See Comments)    Muscle Aches  . Keflex [Cephalexin] Other (See Comments)    Dizziness   . Peanut Butter Flavor Hives  . Sulfa Antibiotics Hives and Rash  . Cephalexin Other (See Comments)    Dizziness   . Peanut-Containing Drug Products Hives  . Rosuvastatin Other (See Comments)    Muscle  aches  . Sulfa Drugs Cross Reactors Hives and Rash    Consultations:  PCCM, palliative care   Procedures/Studies: DG Chest 1 View  Result Date: 04/20/2021 CLINICAL DATA:  Hypoxia EXAM: CHEST  1 VIEW COMPARISON:  Apr 19, 2021 FINDINGS: The heart size and mediastinal contours largely obscured but appear stable. Aortic atherosclerosis. Low lung volumes. Increased right greater than left pleural effusions with bilateral interstitial and airspace opacities. The visualized skeletal structures are unchanged. IMPRESSION: Increased right greater than left pleural effusions with bilateral interstitial and airspace opacities, likely representing pulmonary edema. Aortic Atherosclerosis (ICD10-I70.0). Electronically Signed   By: Maudry MayhewJeffrey  Waltz MD   On: 04/20/2021 02:37   DG Chest 2 View  Result Date: 04/19/2021 CLINICAL DATA:  Shortness of breath. EXAM: CHEST - 2 VIEW COMPARISON:  September 22, 2020 FINDINGS: Decreased lung volumes are seen which is likely secondary to the degree of patient inspiration. Mild to moderate severity areas of atelectasis and/or infiltrate are noted within the bilateral lung bases. There is no evidence of a pleural effusion or pneumothorax. Mild elevation of the right hemidiaphragm is seen. The heart size and mediastinal contours are within normal limits. There is marked severity calcification of the thoracic aorta. Degenerative changes are noted throughout the thoracic spine. IMPRESSION: Low lung volumes with mild to moderate severity bibasilar atelectasis and/or infiltrate. Electronically Signed   By: Aram Candela M.D.   On: 04/19/2021 16:22   CT Head Wo Contrast  Result Date: 04/19/2021 CLINICAL DATA:  Headache hypotension EXAM: CT HEAD WITHOUT CONTRAST TECHNIQUE: Contiguous axial images were obtained from the base of the skull through the vertex without intravenous contrast. COMPARISON:  CT brain 01/01/2019 FINDINGS: Brain: No acute territorial infarction, hemorrhage, or  intracranial mass. Patchy white matter hypodensity consistent with chronic small vessel ischemic change. Vascular: No hyperdense vessels. Vertebral and carotid vascular calcification Skull: Normal. Negative for fracture or focal lesion. Sinuses/Orbits: No acute finding. Other: None IMPRESSION: 1. No CT evidence for acute intracranial abnormality. 2. Mild atrophy and chronic small vessel ischemic change of the white matter Electronically Signed   By: Jasmine Pang M.D.   On: 04/19/2021 18:17      Subjective: No acute issues or events overnight patient denies nausea vomiting diarrhea constipation headache fevers or chills.   Discharge Exam: Vitals:   04/24/21 0409 04/24/21 0734  BP: (!) 178/79 (!) 156/66  Pulse: 85 88  Resp: (!) 28 18  Temp: 97.9 F (36.6 C) 98.2 F (36.8 C)  SpO2: 100% 95%   Vitals:   04/23/21 2048 04/24/21 0017 04/24/21 0409 04/24/21 0734  BP:  (!) 172/71 (!) 178/79 (!) 156/66  Pulse:  84 85 88  Resp:  17 (!) 28 18  Temp: 97.6 F (36.4 C)  97.9 F (36.6 C) 98.2 F (36.8 C)  TempSrc: Oral  Oral Oral  SpO2:  98% 100% 95%  Weight:      Height:        General:  Pleasantly resting in bed, No acute distress.  Awake alert oriented, back to baseline HEENT:  Normocephalic atraumatic.  Sclerae nonicteric, noninjected.  Extraocular movements intact bilaterally. Neck:  Without mass or deformity.  Trachea is midline. Lungs: Diminished breath sounds bilaterally without rhonchi, wheeze, or rales. Heart:  Regular rate and rhythm.  Without murmurs, rubs, or gallops. Abdomen:  Soft, nontender, nondistended.  Without guarding or rebound. Extremities: Without cyanosis, clubbing, edema, or obvious deformity. Vascular:  Dorsalis pedis and posterior tibial pulses palpable bilaterally. Skin:  Warm and dry, no erythema    The results of significant diagnostics from this hospitalization (including imaging, microbiology, ancillary and laboratory) are listed below for reference.      Microbiology: Recent Results (from the past 240 hour(s))  SARS CORONAVIRUS 2 (TAT 6-24 HRS) Nasopharyngeal Nasopharyngeal Swab     Status: None   Collection Time: 04/20/21 12:58 AM   Specimen: Nasopharyngeal Swab  Result Value Ref Range Status   SARS Coronavirus 2 NEGATIVE NEGATIVE Final    Comment: (NOTE) SARS-CoV-2 target nucleic acids are NOT DETECTED.  The SARS-CoV-2 RNA is generally detectable in upper and lower respiratory specimens during the acute phase of infection. Negative results do not preclude SARS-CoV-2 infection, do not rule out co-infections with other pathogens, and should not be used as the sole basis for treatment or  other patient management decisions. Negative results must be combined with clinical observations, patient history, and epidemiological information. The expected result is Negative.  Fact Sheet for Patients: HairSlick.no  Fact Sheet for Healthcare Providers: quierodirigir.com  This test is not yet approved or cleared by the [place] FDA and  has been authorized for detection and/or diagnosis of SARS-CoV-2 by FDA under an Emergency Use Authorization (EUA). This EUA will remain  in effect (meaning this test can be used) for the duration of the COVID-19 declaration under Se ction 564(b)(1) of the Act, 21 U.S.C. section 360bbb-3(b)(1), unless the authorization is terminated or revoked sooner.  Performed at [facility], [address] [place], [place] [address]      Labs: BNP (last 3 results) Recent Labs    04/19/21 1533 04/20/21 0215  BNP 578.7* 538.6*   Basic Metabolic Panel: Recent Labs  Lab 04/20/21 0215 04/21/21 1126 04/22/21 0146 04/23/21 0320 04/24/21 0727  NA 125* 123* 122* 122* 123*  K 5.6* 3.6 3.8 5.2* 4.3  CL 79* 70* 69* 72* 72*  CO2 39* 43* 42* 42* 39*  GLUCOSE 89 154* 104* 94 112*  BUN CREATININE 0.61 0.71 0.54 0.58 0.62  CALCIUM  9.4 9.7 9.9 10.1 10.4*   Liver Function Tests: Recent Labs  Lab 04/19/21 1531  AST 22  ALT 11  ALKPHOS 69  BILITOT 0.4  PROT 6.5  ALBUMIN 2.8*   No results for input(s): LIPASE, AMYLASE in the last 168 hours. No results for input(s): AMMONIA in the last 168 hours. CBC: Recent Labs  Lab 04/19/21 1531 04/20/21 0110 04/20/21 0139 04/22/21 0146 04/23/21 0320 04/24/21 0727  WBC 5.6 4.2  --  7.6 9.9 9.9  NEUTROABS 4.2 2.8  --   --   --   --   HGB 8.1* 7.9* 8.8* 9.4* 9.5* 9.2*  HCT 26.7* 25.9* 26.0* 29.3* 29.0* 28.5*  MCV 93.0 92.5  --  86.2 86.1 86.9  PLT 198 224  --  272 319 242   Cardiac Enzymes: No results for input(s): CKTOTAL, CKMB, CKMBINDEX, TROPONINI in the last 168 hours. BNP: Invalid input(s): POCBNP CBG: Recent Labs  Lab 04/20/21 0519  GLUCAP 92   D-Dimer No results for input(s): DDIMER in the last 72 hours. Hgb A1c No results for input(s): HGBA1C in the last 72 hours. Lipid Profile No results for input(s): CHOL, HDL, LDLCALC, TRIG, CHOLHDL, LDLDIRECT in the last 72 hours. Thyroid function studies No results for input(s): TSH, T4TOTAL, T3FREE, THYROIDAB in the last 72 hours.  Invalid input(s): FREET3 Anemia work up No results for input(s): VITAMINB12, FOLATE, FERRITIN, TIBC, IRON, RETICCTPCT in the last 72 hours. Urinalysis    Component Value Date/Time   COLORURINE YELLOW 04/19/2021 2213   APPEARANCEUR HAZY (A) 04/19/2021 2213   APPEARANCEUR Cloudy (A) 04/30/2019 0000   LABSPEC 1.017 04/19/2021 2213   PHURINE 5.0 04/19/2021 2213   GLUCOSEU NEGATIVE 04/19/2021 2213   HGBUR NEGATIVE 04/19/2021 2213   BILIRUBINUR NEGATIVE 04/19/2021 2213   BILIRUBINUR Negative 04/30/2019 0000   KETONESUR NEGATIVE 04/19/2021 2213   PROTEINUR 100 (A) 04/19/2021 2213   UROBILINOGEN 0.2 01/11/2014 1951   NITRITE NEGATIVE 04/19/2021 2213   LEUKOCYTESUR LARGE (A) 04/19/2021 2213   Sepsis Labs Invalid input(s): PROCALCITONIN,  WBC,  LACTICIDVEN Microbiology Recent  Results (from the past 240 hour(s))  SARS CORONAVIRUS 2 (TAT 6-24 HRS) Nasopharyngeal Nasopharyngeal Swab     Status: None   Collection Time: 04/20/21 12:58 AM  Specimen: Nasopharyngeal Swab  Result Value Ref Range Status   SARS Coronavirus 2 NEGATIVE NEGATIVE Final    Comment: (NOTE) SARS-CoV-2 target nucleic acids are NOT DETECTED.  The SARS-CoV-2 RNA is generally detectable in upper and lower respiratory specimens during the acute phase of infection. Negative results do not preclude SARS-CoV-2 infection, do not rule out co-infections with other pathogens, and should not be used as the sole basis for treatment or other patient management decisions. Negative results must be combined with clinical observations, patient history, and epidemiological information. The expected result is Negative.  Fact Sheet for Patients: HairSlick.no  Fact Sheet for Healthcare Providers: quierodirigir.com  This test is not yet approved or cleared by the [place] FDA and  has been authorized for detection and/or diagnosis of SARS-CoV-2 by FDA under an Emergency Use Authorization (EUA). This EUA will remain  in effect (meaning this test can be used) for the duration of the COVID-19 declaration under Se ction 564(b)(1) of the Act, 21 U.S.C. section 360bbb-3(b)(1), unless the authorization is terminated or revoked sooner.  Performed at [facility], [address] [place], [place] [address]      Time coordinating discharge: Over 30 minutes  SIGNED:   Azucena Fallen, DO [facility] 04/24/2021, 2:12 PM Pager   If 7PM-7AM, please contact night-coverage [email or web address]

## 2021-04-24 NOTE — Telephone Encounter (Signed)
-----   Message from Lupita Leash, MD sent at 04/24/2021  9:27 AM EDT ----- Hi,  Please arrange hospital follow up in 2-4 weeks with Clent Ridges or Vassie Loll, recent admission for acute on chronic hypercarbic resp failure due to benzodiazapine overdose and non-compliance with NIMV.  Family decided against hospice after palliative care consultation.  Thanks, Kipp Brood

## 2021-04-26 DIAGNOSIS — I69391 Dysphagia following cerebral infarction: Secondary | ICD-10-CM | POA: Diagnosis not present

## 2021-04-26 DIAGNOSIS — E785 Hyperlipidemia, unspecified: Secondary | ICD-10-CM | POA: Diagnosis not present

## 2021-04-26 DIAGNOSIS — I252 Old myocardial infarction: Secondary | ICD-10-CM | POA: Diagnosis not present

## 2021-04-26 DIAGNOSIS — M545 Low back pain, unspecified: Secondary | ICD-10-CM | POA: Diagnosis not present

## 2021-04-26 DIAGNOSIS — R1310 Dysphagia, unspecified: Secondary | ICD-10-CM | POA: Diagnosis not present

## 2021-04-26 DIAGNOSIS — Z9981 Dependence on supplemental oxygen: Secondary | ICD-10-CM | POA: Diagnosis not present

## 2021-04-26 DIAGNOSIS — J9611 Chronic respiratory failure with hypoxia: Secondary | ICD-10-CM | POA: Diagnosis not present

## 2021-04-26 DIAGNOSIS — I110 Hypertensive heart disease with heart failure: Secondary | ICD-10-CM | POA: Diagnosis not present

## 2021-04-26 DIAGNOSIS — K5900 Constipation, unspecified: Secondary | ICD-10-CM | POA: Diagnosis not present

## 2021-04-26 DIAGNOSIS — I2721 Secondary pulmonary arterial hypertension: Secondary | ICD-10-CM | POA: Diagnosis not present

## 2021-04-26 DIAGNOSIS — Z9181 History of falling: Secondary | ICD-10-CM | POA: Diagnosis not present

## 2021-04-26 DIAGNOSIS — I5032 Chronic diastolic (congestive) heart failure: Secondary | ICD-10-CM | POA: Diagnosis not present

## 2021-04-26 DIAGNOSIS — F32A Depression, unspecified: Secondary | ICD-10-CM | POA: Diagnosis not present

## 2021-04-26 DIAGNOSIS — K219 Gastro-esophageal reflux disease without esophagitis: Secondary | ICD-10-CM | POA: Diagnosis not present

## 2021-04-26 DIAGNOSIS — G8929 Other chronic pain: Secondary | ICD-10-CM | POA: Diagnosis not present

## 2021-04-26 DIAGNOSIS — Z7982 Long term (current) use of aspirin: Secondary | ICD-10-CM | POA: Diagnosis not present

## 2021-04-26 DIAGNOSIS — E039 Hypothyroidism, unspecified: Secondary | ICD-10-CM | POA: Diagnosis not present

## 2021-04-26 DIAGNOSIS — G4733 Obstructive sleep apnea (adult) (pediatric): Secondary | ICD-10-CM | POA: Diagnosis not present

## 2021-04-26 DIAGNOSIS — I69354 Hemiplegia and hemiparesis following cerebral infarction affecting left non-dominant side: Secondary | ICD-10-CM | POA: Diagnosis not present

## 2021-04-26 DIAGNOSIS — G20 Parkinson's disease: Secondary | ICD-10-CM | POA: Diagnosis not present

## 2021-04-27 DIAGNOSIS — E785 Hyperlipidemia, unspecified: Secondary | ICD-10-CM | POA: Diagnosis not present

## 2021-04-27 DIAGNOSIS — I2721 Secondary pulmonary arterial hypertension: Secondary | ICD-10-CM | POA: Diagnosis not present

## 2021-04-27 DIAGNOSIS — Z9181 History of falling: Secondary | ICD-10-CM | POA: Diagnosis not present

## 2021-04-27 DIAGNOSIS — R1310 Dysphagia, unspecified: Secondary | ICD-10-CM | POA: Diagnosis not present

## 2021-04-27 DIAGNOSIS — M545 Low back pain, unspecified: Secondary | ICD-10-CM | POA: Diagnosis not present

## 2021-04-27 DIAGNOSIS — K5900 Constipation, unspecified: Secondary | ICD-10-CM | POA: Diagnosis not present

## 2021-04-27 DIAGNOSIS — I5032 Chronic diastolic (congestive) heart failure: Secondary | ICD-10-CM | POA: Diagnosis not present

## 2021-04-27 DIAGNOSIS — F32A Depression, unspecified: Secondary | ICD-10-CM | POA: Diagnosis not present

## 2021-04-27 DIAGNOSIS — I110 Hypertensive heart disease with heart failure: Secondary | ICD-10-CM | POA: Diagnosis not present

## 2021-04-27 DIAGNOSIS — J9611 Chronic respiratory failure with hypoxia: Secondary | ICD-10-CM | POA: Diagnosis not present

## 2021-04-27 DIAGNOSIS — K219 Gastro-esophageal reflux disease without esophagitis: Secondary | ICD-10-CM | POA: Diagnosis not present

## 2021-04-27 DIAGNOSIS — I69391 Dysphagia following cerebral infarction: Secondary | ICD-10-CM | POA: Diagnosis not present

## 2021-04-27 DIAGNOSIS — G4733 Obstructive sleep apnea (adult) (pediatric): Secondary | ICD-10-CM | POA: Diagnosis not present

## 2021-04-27 DIAGNOSIS — Z7982 Long term (current) use of aspirin: Secondary | ICD-10-CM | POA: Diagnosis not present

## 2021-04-27 DIAGNOSIS — G8929 Other chronic pain: Secondary | ICD-10-CM | POA: Diagnosis not present

## 2021-04-27 DIAGNOSIS — Z9981 Dependence on supplemental oxygen: Secondary | ICD-10-CM | POA: Diagnosis not present

## 2021-04-27 DIAGNOSIS — E039 Hypothyroidism, unspecified: Secondary | ICD-10-CM | POA: Diagnosis not present

## 2021-04-27 DIAGNOSIS — I69354 Hemiplegia and hemiparesis following cerebral infarction affecting left non-dominant side: Secondary | ICD-10-CM | POA: Diagnosis not present

## 2021-04-27 DIAGNOSIS — G20 Parkinson's disease: Secondary | ICD-10-CM | POA: Diagnosis not present

## 2021-04-27 DIAGNOSIS — I252 Old myocardial infarction: Secondary | ICD-10-CM | POA: Diagnosis not present

## 2021-04-28 DIAGNOSIS — E871 Hypo-osmolality and hyponatremia: Secondary | ICD-10-CM | POA: Diagnosis not present

## 2021-04-28 DIAGNOSIS — J9611 Chronic respiratory failure with hypoxia: Secondary | ICD-10-CM | POA: Diagnosis not present

## 2021-04-28 DIAGNOSIS — I10 Essential (primary) hypertension: Secondary | ICD-10-CM | POA: Diagnosis not present

## 2021-04-30 DIAGNOSIS — B9562 Methicillin resistant Staphylococcus aureus infection as the cause of diseases classified elsewhere: Secondary | ICD-10-CM | POA: Diagnosis not present

## 2021-04-30 DIAGNOSIS — I6789 Other cerebrovascular disease: Secondary | ICD-10-CM | POA: Diagnosis not present

## 2021-04-30 DIAGNOSIS — R7881 Bacteremia: Secondary | ICD-10-CM | POA: Diagnosis not present

## 2021-04-30 DIAGNOSIS — J9601 Acute respiratory failure with hypoxia: Secondary | ICD-10-CM | POA: Diagnosis not present

## 2021-05-04 ENCOUNTER — Inpatient Hospital Stay: Payer: Medicare Other | Admitting: Pulmonary Disease

## 2021-05-15 ENCOUNTER — Inpatient Hospital Stay: Payer: Medicare Other | Admitting: Primary Care

## 2021-06-12 NOTE — Progress Notes (Signed)
Trilogy download 12/19/20-01/17/21 Days used 29  Average usage 4.9 hours Average IPAP 15.7; Average EPAP 4.9 Average breaths per minute 13.6

## 2021-09-21 ENCOUNTER — Telehealth: Payer: Self-pay | Admitting: Primary Care

## 2021-09-21 NOTE — Telephone Encounter (Signed)
Spoke to [place] with [employer]. Misty Stanley stated that patient wears 1L cont.  She monitored spo2 on roomair today. Spo2 is maintaining around 91-94% on roomair. Misty Stanley is questioning if patient should continue to wear o2 at rest? Waynetta Sandy is not in the office this afternoon. Misty Stanley is okay with waiting until 09/22/2021 for a response.   Beth, please advise. thanks

## 2021-09-22 NOTE — Telephone Encounter (Signed)
She can come off oxygen at rest, continue to monitor if <88% needs to use 1L

## 2021-09-22 NOTE — Telephone Encounter (Signed)
Tammy Fernandez with [facility] is aware of below message/recommendations and voiced her understanding.  Nothing further needed.

## 2021-10-03 ENCOUNTER — Encounter: Payer: Self-pay | Admitting: Primary Care

## 2021-10-20 ENCOUNTER — Telehealth: Payer: Self-pay | Admitting: Primary Care

## 2021-10-20 NOTE — Telephone Encounter (Signed)
ATC x2, no answer. Will try again later.

## 2021-10-20 NOTE — Telephone Encounter (Signed)
Called patient's son but he did not answer. His VM is not setup. Will call back later.

## 2021-10-25 NOTE — Telephone Encounter (Signed)
Called and spoke with patient's Tammy Fernandez per DPR to let him know the recs from [place]. He expressed understanding. He stated that patient does have a flutter valve. Advised him to have her use that as much as she can and also to use it after doing a nebulizer treatment. He expressed understanding and also stated that he would look into getting a humidifier. Advised him if she is still not doing good come next week then to call us back and maybe set up an appt. He agreed. Nothing further needed at this time.

## 2021-10-25 NOTE — Telephone Encounter (Signed)
Really only thing you can do for congestion is take mucinex/guaifenesin, use saline nasal irrigation 2-3 times a day, use albuterol nebulizer every 4-6 hours and flutter valve if she has one.  Warm beverages and humidification can help as well

## 2021-10-25 NOTE — Telephone Encounter (Signed)
Primary Pulmonologist: Vassie Loll Last office visit and with whom: 04/18/21 with BW What do we see them for (pulmonary problems): chron. Resp. Fail, sinusitis, OSA Last OV assessment/plan:  Assessment & Plan Note by Glenford Bayley, NP at 04/18/2021 1:17 PM  Author: Glenford Bayley, NP Author Type: Nurse Practitioner Filed: 04/18/2021  1:19 PM  Note Status: Written Cosign: Cosign Not Required Encounter Date: 04/18/2021  Problem: OSA on CPAP  Editor: Glenford Bayley, NP (Nurse Practitioner)               - Increased daytime sleepiness over the last several months. She reports compliance with Trilogy. She did not tolerate nasal cradle mask, back to using full face mask. Respiratory therapist was our to their house on Friday 5/13. Son reports CO2 level was 41 a couple of weeks ago. Checking BMET today.          Patient Instructions by Glenford Bayley, NP at 04/18/2021 12:00 PM  Author: Glenford Bayley, NP Author Type: Nurse Practitioner Filed: 04/18/2021 12:36 PM  Note Status: Addendum Cosign: Cosign Not Required Encounter Date: 04/18/2021  Editor: Glenford Bayley, NP (Nurse Practitioner)      Prior Versions: 1. Glenford Bayley, NP (Nurse Practitioner) at 04/18/2021 12:33 PM - Addendum   2. Glenford Bayley, NP (Nurse Practitioner) at 04/18/2021 12:17 PM - Addendum   3. Glenford Bayley, NP (Nurse Practitioner) at 04/18/2021 12:15 PM - Addendum   4. Glenford Bayley, NP (Nurse Practitioner) at 04/18/2021 12:10 PM - Addendum   5. Glenford Bayley, NP (Nurse Practitioner) at 04/18/2021 12:08 PM - Signed    Recommendations: - Continue Trilogy ventilator at night - Drop oxygen to 1.5L continuously (if O2 sustaining <88% than can increase to 2L) - Continue flonase, mucinex and saline nasal rinses  - If short of breath try using 1/2 albuterol nebulizer  - Signs of increased carbon dioxide level to monitor for confusion, increased sleepiness, increased swelling in feet   Orders: -  Please request download on trilogy from [employer] - Labs today (BMET- ordered)   Refer: - ENT re: chronic maxillary sinusitis    Follow-up: - 3 months with Dr. Vassie Loll or sooner if needed        Was appointment offered to patient (explain)?  Appt offered, but due to pt having PT is unable to come in for an appt   Reason for call: Called and spoke with pt's son Tammy Fernandez to see how pt was doing. Per Tammy Fernandez, pt still has a lot of phlegm that is getting stuck in her throat when she tried to cough it out.  Pt is using the flutter valve as she is supposed to, taking mucinex, using her nebulizer solutions and all other meds as directed.  The phlegm that pt is getting up is clear in color but the problem is that the phlegm is getting stuck and it is making pt gag/choke and due to this and the congestion, it is making it hard for pt to eat.  Tammy Fernandez wants to know if there is any recommendations that we can make for pt. Pt is not able to come in for an appt as she is doing PT this morning.  Tammy Fernandez, please advise.  (examples of things to ask: : When did symptoms start? Fever? Cough? Productive? Color to sputum? More sputum than usual? Wheezing? Have you needed increased oxygen? Are you taking your respiratory medications? What over the counter measures have you tried?)  Allergies  Allergen Reactions   Crestor [Rosuvastatin Calcium] Other (See Comments)    Muscle Aches   Keflex [Cephalexin] Other (See Comments)    Dizziness    Peanut Butter Flavor Hives   Sulfa Antibiotics Hives and Rash   Cephalexin Other (See Comments)    Dizziness    Peanut-Containing Drug Products Hives   Rosuvastatin Other (See Comments)    Muscle aches   Sulfa Drugs Cross Reactors Hives and Rash    Immunization History  Administered Date(s) Administered   Fluad Quad(high Dose 65+) 08/07/2019, 08/12/2020   Influenza-Unspecified 09/02/2013, 09/15/2018   PFIZER(Purple Top)SARS-COV-2 Vaccination 06/30/2020, 07/21/2020    Pneumococcal Conjugate-13 08/05/2018   Pneumococcal-Unspecified 09/02/2013

## 2021-11-03 ENCOUNTER — Telehealth: Payer: Self-pay | Admitting: Primary Care

## 2021-11-03 NOTE — Telephone Encounter (Signed)
Called and spoke with Melissa from [employer] about message received. Per Efraim Kaufmann, send a community message to the group with the info received and they will pull info needed. Message sent. Nothing further needed.

## 2021-11-20 ENCOUNTER — Telehealth: Payer: Self-pay | Admitting: *Deleted

## 2021-11-20 ENCOUNTER — Encounter: Payer: Self-pay | Admitting: Primary Care

## 2021-11-20 NOTE — Telephone Encounter (Signed)
Patient scheduled 12/05/2021 at 215pm with Dr. Vassie Loll- nothing further needed.

## 2021-11-20 NOTE — Telephone Encounter (Signed)
-----   Message from Oretha Milch, MD sent at 11/18/2021  2:54 AM EST ----- Regarding: FW: CO2 44, on Trilogy Needs FU OV with BW/ me ----- Message ----- From: Glenford Bayley, NP Sent: 04/18/2021   5:40 PM EST To: Oretha Milch, MD Subject: CO2 44, on Trilogy                             Patient of yours on Trilogy vent. Reports compliance, requesting download. CO2 on BMET was 44. She was on 2L oxygen, O2 was 100%. Advised her to turn O2 liter flow down to 1-1.5L. She is more tired during the day. No confusion.   Anything else youd recommend. Do you want me to get ABGs?

## 2021-12-05 ENCOUNTER — Ambulatory Visit: Payer: Medicare Other | Admitting: Pulmonary Disease

## 2021-12-06 ENCOUNTER — Emergency Department (HOSPITAL_COMMUNITY)
Admission: EM | Admit: 2021-12-06 | Discharge: 2021-12-07 | Disposition: A | Discharge status: Home / Self Care | Payer: Medicare Other | Attending: Emergency Medicine | Admitting: Emergency Medicine

## 2021-12-06 ENCOUNTER — Other Ambulatory Visit: Payer: Self-pay

## 2021-12-06 ENCOUNTER — Encounter (HOSPITAL_COMMUNITY): Payer: Self-pay | Admitting: Emergency Medicine

## 2021-12-06 DIAGNOSIS — H9313 Tinnitus, bilateral: Secondary | ICD-10-CM | POA: Insufficient documentation

## 2021-12-06 DIAGNOSIS — R42 Dizziness and giddiness: Secondary | ICD-10-CM | POA: Insufficient documentation

## 2021-12-06 DIAGNOSIS — I10 Essential (primary) hypertension: Secondary | ICD-10-CM | POA: Insufficient documentation

## 2021-12-06 DIAGNOSIS — Z91010 Allergy to peanuts: Secondary | ICD-10-CM | POA: Insufficient documentation

## 2021-12-06 DIAGNOSIS — Z7982 Long term (current) use of aspirin: Secondary | ICD-10-CM | POA: Insufficient documentation

## 2021-12-06 DIAGNOSIS — Z79899 Other long term (current) drug therapy: Secondary | ICD-10-CM | POA: Insufficient documentation

## 2021-12-06 LAB — COMPREHENSIVE METABOLIC PANEL
ALT: 10 U/L (ref 0–44)
AST: 20 U/L (ref 15–41)
Albumin: 2.6 g/dL — ABNORMAL LOW (ref 3.5–5.0)
Alkaline Phosphatase: 76 U/L (ref 38–126)
Anion gap: 6 (ref 5–15)
BUN: 7 mg/dL — ABNORMAL LOW (ref 8–23)
CO2: 35 mmol/L — ABNORMAL HIGH (ref 22–32)
Calcium: 9.3 mg/dL (ref 8.9–10.3)
Chloride: 89 mmol/L — ABNORMAL LOW (ref 98–111)
Creatinine, Ser: 0.51 mg/dL (ref 0.44–1.00)
GFR, Estimated: >60 mL/min (ref >60)
Glucose, Bld: 99 mg/dL (ref 70–99)
Potassium: 4.1 mmol/L (ref 3.5–5.1)
Sodium: 130 mmol/L — ABNORMAL LOW (ref 135–145)
Total Bilirubin: 0.5 mg/dL (ref 0.3–1.2)
Total Protein: 6.3 g/dL — ABNORMAL LOW (ref 6.5–8.1)

## 2021-12-06 LAB — CBC WITH DIFFERENTIAL/PLATELET
Abs Immature Granulocytes: 0.01 10*3/uL (ref 0.00–0.07)
Basophils Absolute: 0 10*3/uL (ref 0.0–0.1)
Basophils Relative: 0 %
Eosinophils Absolute: 0.1 10*3/uL (ref 0.0–0.5)
Eosinophils Relative: 1 %
HCT: 30.6 % — ABNORMAL LOW (ref 36.0–46.0)
Hemoglobin: 9.6 g/dL — ABNORMAL LOW (ref 12.0–15.0)
Immature Granulocytes: 0 %
Lymphocytes Relative: 27 %
Lymphs Abs: 2.1 10*3/uL (ref 0.7–4.0)
MCH: 27.7 pg (ref 26.0–34.0)
MCHC: 31.4 g/dL (ref 30.0–36.0)
MCV: 88.2 fL (ref 80.0–100.0)
Monocytes Absolute: 0.5 10*3/uL (ref 0.1–1.0)
Monocytes Relative: 7 %
Neutro Abs: 4.9 10*3/uL (ref 1.7–7.7)
Neutrophils Relative %: 65 %
Platelets: 271 10*3/uL (ref 150–400)
RBC: 3.47 MIL/uL — ABNORMAL LOW (ref 3.87–5.11)
RDW: 15.3 % (ref 11.5–15.5)
WBC: 7.5 10*3/uL (ref 4.0–10.5)
nRBC: 0 % (ref 0.0–0.2)

## 2021-12-06 NOTE — ED Triage Notes (Signed)
Patient BIB [facility] from home with with complaint of dizziness x3 days and bilateral arm numbness x3 days, without weakness. Patient wears 1L O2 [place] at baseline. Patient denies other complaints at this time.  EMS Vitals 131/58 HR 86 NSR RR 18 SPO2 100% on 1L O2 [place] CBG 166

## 2021-12-06 NOTE — ED Provider Triage Note (Signed)
Emergency Medicine Provider Triage Evaluation Note  Tammy Fernandez , a 88 y.o. female  was evaluated in triage.  Pt complains of weakness,  Pt has been taking laxatives for constipation,  Pt has increased weakness today.  Pt's son concerned about uti Review of Systems  Positive: weakness Negative: fever  Physical Exam  BP (!) 129/56    Pulse 84    Temp 98 F (36.7 C) (Oral)    Resp 20    SpO2 96%  Gen:   Awake, no distress   Resp:  Normal effort  MSK:   Moves extremities without difficulty  Other:    Medical Decision Making  Medically screening exam initiated at 3:16 PM.  Appropriate orders placed.  Tammy Fernandez was informed that the remainder of the evaluation will be completed by another provider, this initial triage assessment does not replace that evaluation, and the importance of remaining in the ED until their evaluation is complete.     Tammy Fernandez, [place] 12/06/21 1534

## 2021-12-07 ENCOUNTER — Emergency Department (HOSPITAL_COMMUNITY): Payer: Medicare Other

## 2021-12-07 DIAGNOSIS — R42 Dizziness and giddiness: Secondary | ICD-10-CM | POA: Diagnosis not present

## 2021-12-07 MED ORDER — MECLIZINE HCL 25 MG PO TABS
12.5000 mg | ORAL_TABLET | Freq: Once | ORAL | Status: AC
  Administered 2021-12-07: 12.5 mg via ORAL
  Filled 2021-12-07: qty 1

## 2021-12-07 MED ORDER — SODIUM CHLORIDE 0.9 % IV BOLUS: 500.0000 mL | Freq: Once | INTRAVENOUS | Status: DC

## 2021-12-07 MED ORDER — MECLIZINE HCL 12.5 MG PO TABS: 12.5000 mg | ORAL_TABLET | Freq: Three times a day (TID) | ORAL | 0 refills | Status: DC | PRN

## 2021-12-07 MED ORDER — ACETAMINOPHEN 325 MG PO TABS
650.0000 mg | ORAL_TABLET | Freq: Once | ORAL | Status: AC
  Administered 2021-12-07: 650 mg via ORAL
  Filled 2021-12-07: qty 2

## 2021-12-07 MED ORDER — LORAZEPAM 1 MG PO TABS: 0.5000 mg | ORAL_TABLET | Freq: Once | ORAL | Status: DC

## 2021-12-07 MED ORDER — AZELASTINE HCL 0.1 % NA SOLN: 1.0000 | Freq: Two times a day (BID) | NASAL | 0 refills | Status: AC

## 2021-12-07 MED ORDER — MECLIZINE HCL 25 MG PO TABS: 25.0000 mg | ORAL_TABLET | Freq: Three times a day (TID) | ORAL | 0 refills | Status: AC | PRN

## 2021-12-07 NOTE — ED Notes (Signed)
Spoke with MRI regarding pt. Will send for patient shortly.

## 2021-12-07 NOTE — ED Notes (Signed)
Pt transported to MRI via stretcher.  

## 2021-12-07 NOTE — ED Notes (Signed)
PIV attempted X 3 without success. Per MD, OK to have no PIV for now. OK to hold off on IVF.

## 2021-12-07 NOTE — ED Provider Notes (Signed)
Appears pt is here for dizziness. No CT Head ordered in triage. Preemptively ordered for further evaluation of dizziness while pt still in the waiting room.    Tanda Rockers, PA-C 12/07/21 0131    Shon Baton, MD 12/11/21 [phone]
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
## 2021-12-07 NOTE — ED Provider Notes (Addendum)
[facility] Provider Note   CSN: [id number] Arrival date & time: 12/06/21  1246     History  Chief Complaint  Patient presents with   Dizziness    Tammy Fernandez is a 88 y.o. female.  Pt is a 89 you black female with a hx of htn and stroke.  She presents to the ED with dizziness.  Pt said dizziness has been going on for 3 days.  She describes it as the room spinning.  She denies any weakness, but feels that her arms and legs have "fallen asleep."  Pt has waited nearly 24 hrs to be seen.  She still feels dizzy.  Her son said she is mainly bed bound and does not move much, but dizziness worsens with head movement.  She has some ringing in her ears.  She has had this several times in the past.  She thinks it is due to her "low blood."      Home Medications Prior to Admission medications   Medication Sig Start Date End Date Taking? Authorizing Provider  albuterol (PROVENTIL) (2.5 MG/3ML) 0.083% nebulizer solution USE 3ML VIA NEBULIZER EVERY 4 HOURS AS NEEDED FOR SHORTNESS OF BREATH OR WHEEZING Patient taking differently: Take 2.5 mg by nebulization every 4 (four) hours as needed for shortness of breath or wheezing. 01/20/21  Yes Just, Azalee CourseKelsea J, FNP  amLODipine (NORVASC) 10 MG tablet TAKE 1 TABLET(10 MG) BY MOUTH DAILY Patient taking differently: Take 10 mg by mouth in the morning. 09/22/20  Yes Just, Azalee CourseKelsea J, FNP  Aspirin 81 MG EC tablet Take 81 mg by mouth in the morning.   Yes [provider]  cetirizine (ZYRTEC) 10 MG tablet Take 10 mg by mouth daily.   Yes [provider]  demeclocycline (DECLOMYCIN) 300 MG tablet Take 0.5 tablets (150 mg total) by mouth 2 (two) times daily. Patient taking differently: Take 150 mg by mouth 2 (two) times daily. Continuously 11/17/20  Yes Just, Azalee CourseKelsea J, FNP  fluticasone (FLONASE) 50 MCG/ACT nasal spray SHAKE LIQUID AND USE 1 SPRAY IN EACH NOSTRIL TWICE DAILY Patient taking differently: Place 2  sprays into both nostrils daily. 12/16/20  Yes Just, Azalee CourseKelsea J, FNP  guaiFENesin (MUCINEX) 600 MG 12 hr tablet Take 1 tablet (600 mg total) by mouth 2 (two) times daily as needed for to loosen phlegm. Patient taking differently: Take 600 mg by mouth 2 (two) times daily. 08/19/20  Yes Lezlie LyeSantiago Lago, Meda CoffeeIrma M, MD  hydrALAZINE (APRESOLINE) 50 MG tablet Take 1 tablet (50 mg total) by mouth 3 (three) times daily. 04/24/21  Yes Azucena FallenLancaster, William C, MD  lactose free nutrition (BOOST) LIQD Take 237 mLs by mouth 2 (two) times daily between meals.   Yes [provider]  levothyroxine (SYNTHROID) 50 MCG tablet TAKE 1 TABLET(50 MCG) BY MOUTH DAILY BEFORE BREAKFAST Patient taking differently: Take 50 mcg by mouth daily before breakfast. 09/22/20  Yes Just, Azalee CourseKelsea J, FNP  LORazepam (ATIVAN) 0.5 MG tablet Take 0.5 mg by mouth 2 (two) times daily. 06/28/21  Yes [provider]  olopatadine (PATANOL) 0.1 % ophthalmic solution Place 1 drop into both eyes daily as needed for allergies. Patient taking differently: Place 1 drop into both eyes daily. 02/08/21  Yes Just, Azalee CourseKelsea J, FNP  omeprazole (PRILOSEC) 20 MG capsule Take 20 mg by mouth 2 (two) times daily.   Yes [provider]  oxybutynin (DITROPAN) 5 MG tablet Take 5 mg by mouth at bedtime. 01/10/21  Yes  [provider]  OXYGEN Inhale 1-1.5 L/min into the lungs continuous.   Yes [provider]  polyethylene glycol powder (GLYCOLAX/MIRALAX) 17 GM/SCOOP powder Take 17 g by mouth in the morning.   Yes [provider]  Probiotic Product (ALIGN) 4 MG CAPS Take 4 mg by mouth in the morning.   Yes [provider]  senna (SENOKOT) 8.6 MG TABS tablet Take 1 tablet (8.6 mg total) by mouth daily. 09/22/20  Yes Just, Azalee CourseKelsea J, FNP  sodium chloride (OCEAN) 0.65 % SOLN nasal spray Place 1 spray into both nostrils daily.   Yes [provider]  azelastine (ASTELIN) 0.1 % nasal spray Place 1 spray into both nostrils 2  (two) times daily. Use in each nostril as directed 12/07/21 01/06/22  Jacalyn LefevreHaviland, Nels Munn, MD  clotrimazole-betamethasone (LOTRISONE) cream Apply 1 application topically 2 (two) times daily. Patient not taking: Reported on 12/07/2021 09/22/20   Just, Azalee CourseKelsea J, FNP  diclofenac sodium (VOLTAREN) 1 % GEL Apply 2 g topically 4 (four) times daily. Patient not taking: Reported on 04/19/2021 06/02/19   Doristine BosworthStallings, Zoe A, MD  famotidine (PEPCID) 20 MG tablet Take 1 tablet (20 mg total) by mouth 2 (two) times daily. Patient not taking: Reported on 12/07/2021 09/22/20   Just, Azalee CourseKelsea J, FNP  glycerin adult 2 g suppository Place 1 suppository rectally as needed for constipation. Patient not taking: Reported on 04/19/2021 01/27/19   Lezlie LyeSantiago Lago, Meda CoffeeIrma M, MD  ipratropium (ATROVENT) 0.03 % nasal spray USE 2 SPRAYS IN EACH NOSTRIL TWICE DAILY Patient not taking: Reported on 04/19/2021 04/01/20   Doristine BosworthStallings, Zoe A, MD  meclizine (ANTIVERT) 25 MG tablet Take 1 tablet (25 mg total) by mouth 3 (three) times daily as needed for dizziness. 12/07/21 01/06/22  Jacalyn LefevreHaviland, Jesslynn Kruck, MD  Respiratory Therapy Supplies (NEBULIZER) DEVI Use with nebulized albuterol solution as needed for wheezing, cough or shortness of breath 08/17/20   Lezlie LyeSantiago Lago, Meda CoffeeIrma M, MD      Allergies    Crestor Duncan Dull[rosuvastatin calcium], Keflex [cephalexin], Peanut butter flavor, Sulfa antibiotics, Cephalexin, Peanut-containing drug products, Rosuvastatin, and Sulfa drugs cross reactors    Review of Systems   Review of Systems  Neurological:  Positive for dizziness.  All other systems reviewed and are negative.  Physical Exam Updated Vital Signs BP (!) 127/49    Pulse 83    Temp 98.3 F (36.8 C) (Temporal)    Resp 20    SpO2 99%  Physical Exam Vitals and nursing note reviewed.  Constitutional:      Appearance: Normal appearance.  HENT:     Head: Normocephalic and atraumatic.     Right Ear: External ear normal.     Left Ear: External ear normal.     Nose: Nose  normal.     Mouth/Throat:     Mouth: Mucous membranes are dry.  Eyes:     Extraocular Movements: Extraocular movements intact.     Conjunctiva/sclera: Conjunctivae normal.     Pupils: Pupils are equal, round, and reactive to light.  Cardiovascular:     Rate and Rhythm: Normal rate and regular rhythm.     Pulses: Normal pulses.     Heart sounds: Normal heart sounds.  Pulmonary:     Effort: Pulmonary effort is normal.     Breath sounds: Normal breath sounds.  Abdominal:     General: Abdomen is flat. Bowel sounds are normal.     Palpations: Abdomen is soft.  Musculoskeletal:        General:  Normal range of motion.     Cervical back: Normal range of motion and neck supple.  Skin:    General: Skin is warm.     Capillary Refill: Capillary refill takes less than 2 seconds.  Neurological:     General: No focal deficit present.     Mental Status: She is alert and oriented to person, place, and time.  Psychiatric:        Mood and Affect: Mood normal.        Behavior: Behavior normal.    ED Results / Procedures / Treatments   Labs (all labs ordered are listed, but only abnormal results are displayed) Labs Reviewed  CBC WITH DIFFERENTIAL/PLATELET - Abnormal; Notable for the following components:      Result Value   RBC 3.47 (*)    Hemoglobin 9.6 (*)    HCT 30.6 (*)    All other components within normal limits  COMPREHENSIVE METABOLIC PANEL - Abnormal; Notable for the following components:   Sodium 130 (*)    Chloride 89 (*)    CO2 35 (*)    BUN 7 (*)    Total Protein 6.3 (*)    Albumin 2.6 (*)    All other components within normal limits  URINALYSIS, ROUTINE W REFLEX MICROSCOPIC    EKG EKG Interpretation  Date/Time:  Wednesday December 06 2021 19:40:03 EST Ventricular Rate:  92 PR Interval:  134 QRS Duration: 76 QT Interval:  354 QTC Calculation: 437 R Axis:   94 Text Interpretation: Normal sinus rhythm Rightward axis Borderline ECG When compared with ECG of  19-Apr-2021 14:25, PREVIOUS ECG IS PRESENT Since last tracing rate faster Confirmed by Jacalyn Lefevre [phone]) on 12/07/2021 1:44:13 PM  Radiology CT Head Wo Contrast  Result Date: 12/07/2021 CLINICAL DATA:  Dizziness EXAM: CT HEAD WITHOUT CONTRAST TECHNIQUE: Contiguous axial images were obtained from the base of the skull through the vertex without intravenous contrast. COMPARISON:  04/19/2021 FINDINGS: Brain: Normal anatomic configuration. Parenchymal volume loss is commensurate with the patient's age. Mild periventricular white matter changes are present likely reflecting the sequela of small vessel ischemia. No abnormal intra or extra-axial mass lesion or fluid collection. No abnormal mass effect or midline shift. No evidence of acute intracranial hemorrhage or infarct. Ventricular size is normal. Cerebellum unremarkable. Vascular: No asymmetric hyperdense vasculature at the skull base. Skull: Intact Sinuses/Orbits: Paranasal sinuses are clear. Ocular lenses have been removed. Orbits are otherwise unremarkable. Other: Left mastoid effusion has developed. No superimposed osseous erosion. Middle ear cavities and right mastoid air cells are clear. IMPRESSION: No acute intracranial abnormality. New left mastoid effusion. Electronically Signed   By: Helyn Numbers M.D.   On: 12/07/2021 03:22   MR BRAIN WO CONTRAST  Result Date: 12/07/2021 CLINICAL DATA:  Weakness, dizziness, stroke suspected EXAM: MRI HEAD WITHOUT CONTRAST TECHNIQUE: Multiplanar, multiecho pulse sequences of the brain and surrounding structures were obtained without intravenous contrast. COMPARISON:  Same-day noncontrast head CT, MR head 12/05/2015 FINDINGS: Brain: There is no evidence of acute intracranial hemorrhage, extra-axial fluid collection, or acute infarct. Parenchymal volume is normal. The ventricles are normal in size. There are scattered small foci of FLAIR signal abnormality in the subcortical and periventricular white matter likely  reflecting sequela of mild chronic white matter microangiopathy. There is no mass lesion.  There is no midline shift. Vascular: Normal flow voids. Skull and upper cervical spine: Normal marrow signal. Sinuses/Orbits: The paranasal sinuses are clear. Bilateral lens implants are in place. The globes and orbits  are otherwise unremarkable. Other: The left mastoid air cells and middle ear are opacified, unchanged since 04/19/2021 but new since 2020. IMPRESSION: 1. No acute intracranial pathology. 2. Mild chronic white matter microangiopathy. 3. Left mastoid and middle ear effusions, unchanged since 04/19/2021 but new since 2020. Electronically Signed   By: Lesia Hausen M.D.   On: 12/07/2021 15:45    Procedures Procedures    Medications Ordered in ED Medications  sodium chloride 0.9 % bolus 500 mL (has no administration in time range)  LORazepam (ATIVAN) tablet 0.5 mg (has no administration in time range)  meclizine (ANTIVERT) tablet 12.5 mg (12.5 mg Oral Given 12/07/21 1424)  acetaminophen (TYLENOL) tablet 650 mg (650 mg Oral Given 12/07/21 1424)    ED Course/ Medical Decision Making/ A&P                           Medical Decision Making  Sx sound like vertigo or Meniere's disease.  CT head negative. Pt is 89 with a hx of a stroke, so a MRI will be done to further evaluate.  Labs show chronic anemia.  Na is 130, which is better than normal.   Results of studies discussed with pt and her son.  MRI shows some middle ear effusions.  PT's meclizine increased to 25 mg q8h prn dizziness.  Astelin is also refilled.  She is given the number of ENT to f/u.    Pt still feels dizzy, but better than she did.  She is stable d/c.    Final Clinical Impression(s) / ED Diagnoses Final diagnoses:  Vertigo    Rx / DC Orders ED Discharge Orders          Ordered    meclizine (ANTIVERT) 12.5 MG tablet  3 times daily PRN,   Status:  Discontinued        12/07/21 1532    azelastine (ASTELIN) 0.1 % nasal  spray  2 times daily        12/07/21 1532    meclizine (ANTIVERT) 25 MG tablet  3 times daily PRN        12/07/21 1602              Jacalyn Lefevre, MD 12/07/21 1534    Jacalyn Lefevre, MD 12/07/21 [phone]

## 2021-12-07 NOTE — ED Notes (Signed)
Pt cleaned up of urinary incontinence by myself and Polly, NT.

## 2021-12-07 NOTE — ED Notes (Signed)
AVS with prescriptions provided to and discussed with patient and family member at bedside. Pt verbalizes understanding of discharge instructions and denies any questions or concerns at this time. Pt has ride home. Pt taken out of department via W/C and assisted into son's vehicle.

## 2021-12-20 ENCOUNTER — Telehealth: Payer: Self-pay | Admitting: Primary Care

## 2021-12-20 NOTE — Telephone Encounter (Signed)
Checked Beth's papers and there is no fax in there from [employer]. Called and spoke with Tammy Sours letting him know that we had not received a fax and he said he would resend the fax to [place].  Will be on the lookout for the fax.

## 2021-12-20 NOTE — Telephone Encounter (Signed)
Raquel Sarna, have you seen any forms on this pt for Beth? Thanks

## 2021-12-21 ENCOUNTER — Ambulatory Visit (INDEPENDENT_AMBULATORY_CARE_PROVIDER_SITE_OTHER): Payer: Commercial Managed Care - HMO | Admitting: Primary Care

## 2021-12-21 ENCOUNTER — Encounter: Payer: Self-pay | Admitting: Primary Care

## 2021-12-21 ENCOUNTER — Other Ambulatory Visit: Payer: Self-pay

## 2021-12-21 DIAGNOSIS — G4733 Obstructive sleep apnea (adult) (pediatric): Secondary | ICD-10-CM

## 2021-12-21 DIAGNOSIS — J9611 Chronic respiratory failure with hypoxia: Secondary | ICD-10-CM

## 2021-12-21 DIAGNOSIS — J9612 Chronic respiratory failure with hypercapnia: Secondary | ICD-10-CM

## 2021-12-21 DIAGNOSIS — E871 Hypo-osmolality and hyponatremia: Secondary | ICD-10-CM | POA: Diagnosis not present

## 2021-12-21 DIAGNOSIS — J0101 Acute recurrent maxillary sinusitis: Secondary | ICD-10-CM

## 2021-12-21 DIAGNOSIS — Z9989 Dependence on other enabling machines and devices: Secondary | ICD-10-CM

## 2021-12-21 MED ORDER — PREDNISONE 10 MG PO TABS: ORAL_TABLET | ORAL | 0 refills | Status: DC

## 2021-12-21 MED ORDER — OXYMETAZOLINE HCL 0.05 % NA SOLN: 1.0000 | Freq: Two times a day (BID) | NASAL | 0 refills | Status: AC | PRN

## 2021-12-21 MED ORDER — AZITHROMYCIN 250 MG PO TABS: ORAL_TABLET | ORAL | 0 refills | Status: AC

## 2021-12-21 NOTE — Assessment & Plan Note (Addendum)
-   Patient has hx chronic hypoxicand hypercarbic respiratory failure secondary to elevated right hemidiaphragm and pulmonary HTN. She has been on Trilogy ventilator since feb 2020. Her SpO2 level today was 97% on 1L/min oxygen. CO2 on BMET 12/06/21 was 35 ( 8 months ago CO2 was consistently >40). No significant daytime somnolence or confusion. Plan continue Supplemental oxygn 1-1.5L/min to maintain O2 88-92% and Trilogy ventilator at night.

## 2021-12-21 NOTE — Progress Notes (Signed)
@Patient  ID: Tammy Fernandez, female    DOB: 1924/07/09, 88 y.o.   MRN: [id number]  Chief Complaint  Patient presents with   Follow-up    6 month follow up. Pt states that her breathing is stopped up a little bit due to sinus acting up. Son states that her oxygen has been good and she has stayed on 1L of oxygen.     Referring provider: Clovia Cuff, MD  HPI: 88 year old female, never smoked.  Past medical history significant for chronic diastolic heart failure, hypertension, NSTEMI, severe pulmonary arterial systolic hypertension, aspiration pneumonia, chronic respiratory failure, OSA on CPAP, GERD, hypothyroidism, SIADH Parkinson's disease.  Patient of Dr. Elsworth Soho.  Previous [facility] pulm encounter: 10/05/2020/Dr. Elsworth Soho 88 year old woman with pulmonary hypertension chronic hypoxic respiratory failure referred for evaluation of sleep apnea. She is accompanied by her son [name] who reports that she does not have sleep apnea, the problem seems to be elevated, Level  Last office visit 05/2020 reviewed, oxygen saturation was 88% on room air and she qualified for oxygen improved to 1.5 to 2 L.  I have reviewed prior hospitalization and discharge summary.  Seems like this patient does not carry a diagnosis of OSA although this has been noted multiple times by previous providers.  She was provided with noninvasive ventilation/trilogy machine after hospitalization February 2020 for  acute hypercarbic respiratory failure .  She has been maintained on NIV since then.  She reports being frustrated with the machine, she does not like the full facemask she has a productive cough and has to spit into her machine and cannot tolerate the full facemask. She has a cough for 3 weeks with clear mucus, Covid testing was negative She is still trying to be compliant with her machine in spite of these issues  Epworth sleepiness score is 8. Bedtime is between 10 and 11 PM, sleep latency is 20 to 30 minutes, reports 1-2  nocturnal awakenings and is out of bed at 8 AM feeling tired without dryness of mouth or headaches  09/2020 bicarbonate 35 Serial chest x-rays reviewed which show chronic right elevated hemidiaphragm which dates back to 2012  04/18/2021 Patient presents today for 59-month follow-up.  Dr. Valeta Harms, seen for sleep consult by Dr. Elsworth Soho November 2021. Patient has never had sleep studies.  Patient has been on trilogy machine since February 2020 after hospitalization for hypercarbic respiratory failure d/t elevated right hemidiaphragm, pulmonary hypertension.  During last visit she reported problems with fullface mask, this was changed to AirFit N 30 nasal cradle mask.  If unable to tolerate new mask may consider discontinuing noninvasive ventilator if continues to be a burden d/t quality of life. Maintained on 1.5 L of oxygen.  She had an apt scheduled for 04/04/21 which was rescheduled twice. She called our office with sinusitis symptoms and was sending in course of Augmentin 875 BID x 7 days. This helped some but she still reports having a lot of head congestion with clear mucus.. She is taking Zyrtec.   She is more sleepy during the day for the last few months. She is wearing Trilogy ventilator at night and during naps. Mask was leaking, respiratory therapist came oyt Friday. She got a new mask. The nasal mask did not work for her, currently using full face mask. Son reports she had labs drawn 1-2 weeks ago and her CO2 was 41, sodium was 128/127. She takes solidum replacement tabs. She is on 2L oxygen currently. She is getting physical therapy twice a week  for 2 months.     12/21/2021- Interim hx  Patient presents today for overdue follow-up/ chronic respiratory failure and OSA. Accompanied by her son. She has a a lot of sinus drainage with associated vertigo. She is taking flonase, saline and zytec without improvement. She is taking meclazine three times a day for vertigo symptoms. O2 saturation is 97% 1L  oxygen. CO2 on BMET 12/06/21 was 35. Sodium 130. She is fairly consistent with wearing Trilogy ventilator at night. She may not wear it 1-2 times a month if she falls asleep before putting it on. She is alert and awake today during our visit. No significant daytime somnolence, may fall asleep during the day at times but is easily arousable. Sleeping well at night. DME company is [employer].   Trilogy download 12/19/20-01/17/21 Days used 29  Average usage 4.9 hours Average IPAP 15.7; Average EPAP 4.9 Average breaths per minute 13.6   Allergies  Allergen Reactions   Crestor [Rosuvastatin Calcium] Other (See Comments)    Muscle Aches   Keflex [Cephalexin] Other (See Comments)    Dizziness    Peanut Butter Flavor Hives   Sulfa Antibiotics Hives and Rash   Cephalexin Other (See Comments)    Dizziness    Peanut-Containing Drug Products Hives   Rosuvastatin Other (See Comments)    Muscle aches   Sulfa Drugs Cross Reactors Hives and Rash    Immunization History  Administered Date(s) Administered   Fluad Quad(high Dose 65+) 08/07/2019, 08/12/2020   Influenza-Unspecified 09/02/2013, 09/15/2018   PFIZER(Purple Top)SARS-COV-2 Vaccination 06/30/2020, 07/21/2020   Pneumococcal Conjugate-13 08/05/2018   Pneumococcal-Unspecified 09/02/2013    Past Medical History:  Diagnosis Date   Anxiety    Chronic lower back pain    Constipation    h/o fecal disimpaction on 2017   CVA (cerebral vascular accident) ([place]) 2015   neg workup, MRI small acute lacunar infarcts. ASA only   Depression    Diastolic CHF, chronic (HCC)    grade I, most recent echo Jan 2019   Fatty liver    GERD (gastroesophageal reflux disease)    Hyperlipidemia    Hypertension    Hyponatremia    multiple episodes with encephalopathy, renal thinks SIADH   Hypothyroidism, unspecified    Internal hemorrhoid    NSTEMI (non-ST elevated myocardial infarction) ([place]) 08/28/2016   Palpitations    PFO with atrial septal aneurysm     TTE 2015   Pneumonia 2016   Severe pulmonary arterial systolic hypertension ([place]) 12/25/2017   per echo, on 1L oxgen via [place]    Tobacco History: Social History   Tobacco Use  Smoking Status Never  Smokeless Tobacco Former   Types: Snuff  Tobacco Comments   "used snuff when I was 19"   Counseling given: Not Answered Tobacco comments: "used snuff when I was 19"   Outpatient Medications Prior to Visit  Medication Sig Dispense Refill   albuterol (PROVENTIL) (2.5 MG/3ML) 0.083% nebulizer solution USE 3ML VIA NEBULIZER EVERY 4 HOURS AS NEEDED FOR SHORTNESS OF BREATH OR WHEEZING (Patient taking differently: Take 2.5 mg by nebulization every 4 (four) hours as needed for shortness of breath or wheezing.) 75 mL 2   amLODipine (NORVASC) 10 MG tablet TAKE 1 TABLET(10 MG) BY MOUTH DAILY (Patient taking differently: Take 10 mg by mouth in the morning.) 90 tablet 3   Aspirin 81 MG EC tablet Take 81 mg by mouth in the morning.     azelastine (ASTELIN) 0.1 % nasal spray Place 1 spray into  both nostrils 2 (two) times daily. Use in each nostril as directed 9 mL 0   cetirizine (ZYRTEC) 10 MG tablet Take 10 mg by mouth daily.     clotrimazole-betamethasone (LOTRISONE) cream Apply 1 application topically 2 (two) times daily. 30 g 2   demeclocycline (DECLOMYCIN) 300 MG tablet Take 0.5 tablets (150 mg total) by mouth 2 (two) times daily. (Patient taking differently: Take 150 mg by mouth 2 (two) times daily. Continuously) 60 tablet 5   diclofenac sodium (VOLTAREN) 1 % GEL Apply 2 g topically 4 (four) times daily. 100 g 5   famotidine (PEPCID) 20 MG tablet Take 1 tablet (20 mg total) by mouth 2 (two) times daily. 90 tablet 3   fluticasone (FLONASE) 50 MCG/ACT nasal spray SHAKE LIQUID AND USE 1 SPRAY IN EACH NOSTRIL TWICE DAILY (Patient taking differently: Place 2 sprays into both nostrils daily.) 16 g 5   glycerin adult 2 g suppository Place 1 suppository rectally as needed for constipation. 12 suppository 2    guaiFENesin (MUCINEX) 600 MG 12 hr tablet Take 1 tablet (600 mg total) by mouth 2 (two) times daily as needed for to loosen phlegm. (Patient taking differently: Take 600 mg by mouth 2 (two) times daily.) 60 tablet 5   hydrALAZINE (APRESOLINE) 50 MG tablet Take 1 tablet (50 mg total) by mouth 3 (three) times daily. 90 tablet 01   ipratropium (ATROVENT) 0.03 % nasal spray USE 2 SPRAYS IN EACH NOSTRIL TWICE DAILY 30 mL 6   lactose free nutrition (BOOST) LIQD Take 237 mLs by mouth 2 (two) times daily between meals.     levothyroxine (SYNTHROID) 50 MCG tablet TAKE 1 TABLET(50 MCG) BY MOUTH DAILY BEFORE BREAKFAST (Patient taking differently: Take 50 mcg by mouth daily before breakfast.) 90 tablet 3   LORazepam (ATIVAN) 0.5 MG tablet Take 0.5 mg by mouth 2 (two) times daily.     meclizine (ANTIVERT) 25 MG tablet Take 1 tablet (25 mg total) by mouth 3 (three) times daily as needed for dizziness. 30 tablet 0   olopatadine (PATANOL) 0.1 % ophthalmic solution Place 1 drop into both eyes daily as needed for allergies. (Patient taking differently: Place 1 drop into both eyes daily.) 5 mL 5   omeprazole (PRILOSEC) 20 MG capsule Take 20 mg by mouth 2 (two) times daily.     oxybutynin (DITROPAN) 5 MG tablet Take 5 mg by mouth at bedtime.     OXYGEN Inhale 1-1.5 L/min into the lungs continuous.     polyethylene glycol powder (GLYCOLAX/MIRALAX) 17 GM/SCOOP powder Take 17 g by mouth in the morning.     Probiotic Product (ALIGN) 4 MG CAPS Take 4 mg by mouth in the morning.     Respiratory Therapy Supplies (NEBULIZER) DEVI Use with nebulized albuterol solution as needed for wheezing, cough or shortness of breath 1 each 0   senna (SENOKOT) 8.6 MG TABS tablet Take 1 tablet (8.6 mg total) by mouth daily. 120 tablet 3   sodium chloride (OCEAN) 0.65 % SOLN nasal spray Place 1 spray into both nostrils daily.     No facility-administered medications prior to visit.      Review of Systems  Review of Systems   Constitutional: Negative.  Negative for fatigue.  HENT:  Positive for congestion, postnasal drip, rhinorrhea and sinus pressure.   Respiratory:  Negative for cough, chest tightness, shortness of breath and wheezing.   Cardiovascular: Negative.   Psychiatric/Behavioral: Negative.      Physical Exam  BP 128/72 (  BP Location: Right Arm, Patient Position: Sitting, Cuff Size: Normal)    Pulse 79    Temp 98.2 F (36.8 C) (Oral)    Ht 5\' 2"  (1.575 m)    Wt 167 lb (75.8 kg)    SpO2 97%    BMI 30.54 kg/m  Physical Exam Constitutional:      General: She is not in acute distress.    Appearance: Normal appearance.  HENT:     Head: Normocephalic and atraumatic.     Mouth/Throat:     Mouth: Mucous membranes are moist.     Pharynx: Oropharynx is clear.  Cardiovascular:     Rate and Rhythm: Normal rate and regular rhythm.  Pulmonary:     Effort: Pulmonary effort is normal.     Breath sounds: Wheezing present. No rhonchi or rales.  Musculoskeletal:     Cervical back: Normal range of motion and neck supple.     Comments: Gait not assessed, in WC  Skin:    General: Skin is warm and dry.  Neurological:     General: No focal deficit present.     Mental Status: She is alert and oriented to person, place, and time. Mental status is at baseline.     Comments: Alert and oriented, no drowsiness   Psychiatric:        Mood and Affect: Mood normal.        Behavior: Behavior normal.        Thought Content: Thought content normal.        Judgment: Judgment normal.     Lab Results:  CBC    Component Value Date/Time   WBC 7.5 12/06/2021 1449   RBC 3.47 (L) 12/06/2021 1449   HGB 9.6 (L) 12/06/2021 1449   HGB 10.2 (L) 09/22/2020 1517   HCT 30.6 (L) 12/06/2021 1449   HCT 33.4 (L) 09/22/2020 1517   PLT 271 12/06/2021 1449   PLT 235 09/22/2020 1517   MCV 88.2 12/06/2021 1449   MCV 93 09/22/2020 1517   MCH 27.7 12/06/2021 1449   MCHC 31.4 12/06/2021 1449   RDW 15.3 12/06/2021 1449   RDW 12.6  09/22/2020 1517   LYMPHSABS 2.1 12/06/2021 1449   LYMPHSABS 2.9 09/22/2020 1517   MONOABS 0.5 12/06/2021 1449   EOSABS 0.1 12/06/2021 1449   EOSABS 0.2 09/22/2020 1517   BASOSABS 0.0 12/06/2021 1449   BASOSABS 0.0 09/22/2020 1517    BMET    Component Value Date/Time   NA 130 (L) 12/06/2021 1449   NA 135 09/22/2020 1517   K 4.1 12/06/2021 1449   CL 89 (L) 12/06/2021 1449   CO2 35 (H) 12/06/2021 1449   GLUCOSE 99 12/06/2021 1449   BUN 7 (L) 12/06/2021 1449   BUN 14 09/22/2020 1517   CREATININE 0.51 12/06/2021 1449   CALCIUM 9.3 12/06/2021 1449   GFRNONAA >60 12/06/2021 1449   GFRAA 87 09/22/2020 1517    BNP    Component Value Date/Time   BNP 538.6 (H) 04/20/2021 0215    ProBNP No results found for: PROBNP  Imaging: CT Head Wo Contrast  Result Date: 12/07/2021 CLINICAL DATA:  Dizziness EXAM: CT HEAD WITHOUT CONTRAST TECHNIQUE: Contiguous axial images were obtained from the base of the skull through the vertex without intravenous contrast. COMPARISON:  04/19/2021 FINDINGS: Brain: Normal anatomic configuration. Parenchymal volume loss is commensurate with the patient's age. Mild periventricular white matter changes are present likely reflecting the sequela of small vessel ischemia. No abnormal intra or extra-axial mass  lesion or fluid collection. No abnormal mass effect or midline shift. No evidence of acute intracranial hemorrhage or infarct. Ventricular size is normal. Cerebellum unremarkable. Vascular: No asymmetric hyperdense vasculature at the skull base. Skull: Intact Sinuses/Orbits: Paranasal sinuses are clear. Ocular lenses have been removed. Orbits are otherwise unremarkable. Other: Left mastoid effusion has developed. No superimposed osseous erosion. Middle ear cavities and right mastoid air cells are clear. IMPRESSION: No acute intracranial abnormality. New left mastoid effusion. Electronically Signed   By: Fidela Salisbury M.D.   On: 12/07/2021 03:22   MR BRAIN WO  CONTRAST  Result Date: 12/07/2021 CLINICAL DATA:  Weakness, dizziness, stroke suspected EXAM: MRI HEAD WITHOUT CONTRAST TECHNIQUE: Multiplanar, multiecho pulse sequences of the brain and surrounding structures were obtained without intravenous contrast. COMPARISON:  Same-day noncontrast head CT, MR head 12/05/2015 FINDINGS: Brain: There is no evidence of acute intracranial hemorrhage, extra-axial fluid collection, or acute infarct. Parenchymal volume is normal. The ventricles are normal in size. There are scattered small foci of FLAIR signal abnormality in the subcortical and periventricular white matter likely reflecting sequela of mild chronic white matter microangiopathy. There is no mass lesion.  There is no midline shift. Vascular: Normal flow voids. Skull and upper cervical spine: Normal marrow signal. Sinuses/Orbits: The paranasal sinuses are clear. Bilateral lens implants are in place. The globes and orbits are otherwise unremarkable. Other: The left mastoid air cells and middle ear are opacified, unchanged since 04/19/2021 but new since 2020. IMPRESSION: 1. No acute intracranial pathology. 2. Mild chronic white matter microangiopathy. 3. Left mastoid and middle ear effusions, unchanged since 04/19/2021 but new since 2020. Electronically Signed   By: Valetta Mole M.D.   On: 12/07/2021 15:45     Assessment & Plan:   Chronic respiratory failure with hypoxia and hypercapnia (HCC) - Patient has hx chronic hypoxicand hypercarbic respiratory failure secondary to elevated right hemidiaphragm and pulmonary HTN. She has been on Trilogy ventilator since feb 2020. Her SpO2 level today was 97% on 1L/min oxygen. CO2 on BMET 12/06/21 was 35 ( 8 months ago CO2 was consistently >40). No significant daytime somnolence or confusion. Plan continue Supplemental oxygn 1-1.5L/min to maintain O2 88-92% and Trilogy ventilator at night.   Acute recurrent sinusitis - Acute on chronic sinusitis. Mucus burden worse recently  with associated vertigo. She has faint wheezing on exam. She is consistently using Flonase nasal spray, saline nasal rinses and Zyrtec without improvement. For acute symptoms we will treat with zpack, prednisone 20mg  x 5 days and afrin nasal spray x 3 days. She has been referred to ENT.   OSA on CPAP - Patient reports compliance with use, requesting download from Trilogy ventilator  Hyponatremia - Stable; She is on Declomycin for hyponatremia   40 mins spent on case; >50% face to face  Martyn Ehrich, NP 12/21/2021

## 2021-12-21 NOTE — Assessment & Plan Note (Signed)
-   Stable; She is on Declomycin for hyponatremia

## 2021-12-21 NOTE — Assessment & Plan Note (Addendum)
-   Acute on chronic sinusitis. Mucus burden worse recently with associated vertigo. She has faint wheezing on exam. She is consistently using Flonase nasal spray, saline nasal rinses and Zyrtec without improvement. For acute symptoms we will treat with zpack, prednisone 20mg  x 5 days and afrin nasal spray x 3 days. She has been referred to ENT.

## 2021-12-21 NOTE — Assessment & Plan Note (Signed)
-   Patient reports compliance with use, requesting download from Trilogy ventilator

## 2021-12-21 NOTE — Patient Instructions (Addendum)
° °  Recommendations: - Try Afrin nasal spray 1 puff per nostril x 3 days ONLY  - Continue Zyrtec daily - Take mucinex 600mg  twice daily for congestion  - Use albuterol nebulizer every 4-6 hours as needed for shortness of breath - Continue to wear Trilogy ventilator nightly while sleeping  - Titrate O2 1-3L to maintain O2 between 88-96%  - Make appointment with ENT (ear/nose/throat)   Orders: - Please request download from Trilogy ventilator with her DME company   Rx: - Zpack - Prednisone 20mg  x 5 days   Follow-up: - 6 months with Dr. Elsworth Soho  - Please presents to ED if patient ever becomes somnolent or confused

## 2021-12-22 ENCOUNTER — Encounter: Payer: Self-pay | Admitting: Primary Care

## 2021-12-22 NOTE — Telephone Encounter (Signed)
April stated that she left the paperwork on your desk for you to sign on pt.

## 2021-12-22 NOTE — Telephone Encounter (Signed)
Ok, ill have to sign Monday

## 2021-12-22 NOTE — Telephone Encounter (Signed)
Beth, please advise if you have received a fax on pt from [employer].

## 2021-12-22 NOTE — Telephone Encounter (Signed)
I did not, April had something but did not tell me if I needed to sign anything. I finished my note on her.

## 2021-12-25 NOTE — Telephone Encounter (Signed)
April, please advise if this has been taken care of.

## 2021-12-25 NOTE — Telephone Encounter (Signed)
EMILY, FAXED OUT ALL THE PAPERWORK TODAY FOR THIS PATIENT. HAD BETH SIGN LETTER THIS MORNING WHEN SHE CAME IN. FAXED THIS MORNING BEFORE LUNCH TIME. SO IT IS DONE!!! NOTHING FURTHER NEEDED AT THIS TIME

## 2021-12-28 NOTE — Progress Notes (Signed)
Received download from Trilogy, patient is compliant with use 27/30 (90%) days used. Average 6 hours 20 mins. Avg IPAP 17.1. Average EPAP 5.6. Average peak flow 28.3. Average breaths per min 12.9.

## 2021-12-29 ENCOUNTER — Ambulatory Visit (INDEPENDENT_AMBULATORY_CARE_PROVIDER_SITE_OTHER): Payer: Medicare Other | Admitting: Primary Care

## 2021-12-29 ENCOUNTER — Ambulatory Visit (INDEPENDENT_AMBULATORY_CARE_PROVIDER_SITE_OTHER): Payer: Medicare Other

## 2021-12-29 ENCOUNTER — Other Ambulatory Visit: Payer: Self-pay

## 2021-12-29 ENCOUNTER — Encounter: Payer: Self-pay | Admitting: Primary Care

## 2021-12-29 VITALS — BP 132/64 | HR 103 | Temp 97.9°F | Ht 62.0 in | Wt 167.0 lb

## 2021-12-29 DIAGNOSIS — J690 Pneumonitis due to inhalation of food and vomit: Secondary | ICD-10-CM | POA: Diagnosis not present

## 2021-12-29 DIAGNOSIS — J320 Chronic maxillary sinusitis: Secondary | ICD-10-CM

## 2021-12-29 DIAGNOSIS — R410 Disorientation, unspecified: Secondary | ICD-10-CM | POA: Diagnosis not present

## 2021-12-29 DIAGNOSIS — R052 Subacute cough: Secondary | ICD-10-CM | POA: Diagnosis not present

## 2021-12-29 DIAGNOSIS — J9611 Chronic respiratory failure with hypoxia: Secondary | ICD-10-CM | POA: Diagnosis not present

## 2021-12-29 DIAGNOSIS — J9612 Chronic respiratory failure with hypercapnia: Secondary | ICD-10-CM

## 2021-12-29 NOTE — Assessment & Plan Note (Addendum)
-   Rhinitis symptoms improved after 5 day course of azithromycin and prednisone, however, continues to be persistent symptom for patient and she has been referred to ENT.

## 2021-12-29 NOTE — Progress Notes (Signed)
@Patient  ID: Tammy Fernandez, female    DOB: Aug 01, 1924, 88 y.o.   MRN: [id number]  Chief Complaint  Patient presents with   Follow-up    Follow up. Pt states she is somewhat congested. Son states she is more confused the past three days and he is worried it is the medication     Referring provider: Clovia Cuff, MD  HPI:  88 year old female, never smoked.  Past medical history significant for chronic diastolic heart failure, hypertension, NSTEMI, severe pulmonary arterial systolic hypertension, aspiration pneumonia, chronic respiratory failure, OSA on CPAP, GERD, hypothyroidism, SIADH Parkinson's disease.  Patient of Tammy Fernandez.  Previous [facility] pulm encounter: 10/05/2020/Tammy Fernandez 88 year old woman with pulmonary hypertension chronic hypoxic respiratory failure referred for evaluation of sleep apnea. She is accompanied by her son Tammy Fernandez who reports that she does not have sleep apnea, the problem seems to be elevated, Level  Last office visit 05/2020 reviewed, oxygen saturation was 88% on room air and she qualified for oxygen improved to 1.5 to 2 L.  I have reviewed prior hospitalization and discharge summary.  Seems like this patient does not carry a diagnosis of OSA although this has been noted multiple times by previous providers.  She was provided with noninvasive ventilation/trilogy machine after hospitalization February 2020 for  acute hypercarbic respiratory failure .  She has been maintained on NIV since then.  She reports being frustrated with the machine, she does not like the full facemask she has a productive cough and has to spit into her machine and cannot tolerate the full facemask. She has a cough for 3 weeks with clear mucus, Covid testing was negative She is still trying to be compliant with her machine in spite of these issues  Epworth sleepiness score is 8. Bedtime is between 10 and 11 PM, sleep latency is 20 to 30 minutes, reports 1-2 nocturnal awakenings and is out of bed  at 8 AM feeling tired without dryness of mouth or headaches  09/2020 bicarbonate 35 Serial chest x-rays reviewed which show chronic right elevated hemidiaphragm which dates back to 2012  04/18/2021 Patient presents today for 37-month follow-up.  Tammy Fernandez, seen for sleep consult by Tammy Fernandez November 2021. Patient has never had sleep studies.  Patient has been on trilogy machine since February 2020 after hospitalization for hypercarbic respiratory failure d/t elevated right hemidiaphragm, pulmonary hypertension.  During last visit she reported problems with fullface mask, this was changed to AirFit N 30 nasal cradle mask.  If unable to tolerate new mask may consider discontinuing noninvasive ventilator if continues to be a burden d/t quality of life. Maintained on 1.5 L of oxygen.  She had an apt scheduled for 04/04/21 which was rescheduled twice. She called our office with sinusitis symptoms and was sending in course of Augmentin 875 BID x 7 days. This helped some but she still reports having a lot of head congestion with clear mucus.. She is taking Zyrtec.   She is more sleepy during the day for the last few months. She is wearing Trilogy ventilator at night and during naps. Mask was leaking, respiratory therapist came oyt Friday. She got a new mask. The nasal mask did not work for her, currently using full face mask. Son reports she had labs drawn 1-2 weeks ago and her CO2 was 41, sodium was 128/127. She takes solidum replacement tabs. She is on 2L oxygen currently. She is getting physical therapy twice a week for 2 months.   12/21/2021 Patient presents today  for overdue follow-up/ chronic respiratory failure and OSA. Accompanied by her son. She has a a lot of sinus drainage with associated vertigo. She is taking flonase, saline and zytec without improvement. She is taking meclazine three times a day for vertigo symptoms. O2 saturation is 97% 1L oxygen. CO2 on BMET 12/06/21 was 35. Sodium 130. She is fairly  consistent with wearing Trilogy ventilator at night. She may not wear it 1-2 times a month if she falls asleep before putting it on. She is alert and awake today during our visit. No significant daytime somnolence, may fall asleep during the day at times but is easily arousable. Sleeping well at night. DME company is [employer].   Trilogy download December 2022- January 2023 Received download from Trilogy, patient is compliant with use 27/30 (90%) days used. Average 6 hours 20 mins. Avg IPAP 17.1. Average EPAP 5.6. Average peak flow 28.3. Average breaths per min 12.9.   12/29/2021- Interim hx  Patient presents today for acute OV d/t confusion. Accompanied by her son. During our last visit she was treated for acute sinusitis with zpack and prednisone 20mg  x 5 days along with afrin nasal spray. Her son says that she is still having severe vertigo and dizziness symptoms. The last day or two she has appeared more dazed. No daytime sleepiness, drowsiness or somnolence. She is compliant with Trilogy ventilator. She remains on 1L oxygen, no increase in demand. Respiratory symptoms are baseline. She tells me her nasal drainage is not as bad as it was. Denies f/c/s, vision changes, headaches, seizures, daytime sleepiness, shortness of breath, chest tightness, chest pain, wheezing, N/V/D.    Imaging:  04/02/21 CXR >> Increased right greater than left pleural effusions with bilateral interstitial and airspace opacities, likely representing pulmonary edema.  12/07/21 CT head>> no acute intracranial abnormality. New left mastoid effusion.   12/07/21 MRI brain>> no acute intracranial pathology, mild chronic white matter microangipathy, left mastoid and middle ear effusion unchanged from 04/19/21   Allergies  Allergen Reactions   Crestor [Rosuvastatin Calcium] Other (See Comments)    Muscle Aches   Keflex [Cephalexin] Other (See Comments)    Dizziness    Peanut Butter Flavor Hives   Sulfa Antibiotics Hives and Rash    Cephalexin Other (See Comments)    Dizziness    Peanut-Containing Drug Products Hives   Rosuvastatin Other (See Comments)    Muscle aches   Sulfa Drugs Cross Reactors Hives and Rash    Immunization History  Administered Date(s) Administered   Fluad Quad(high Dose 65+) 08/07/2019, 08/12/2020   Influenza-Unspecified 09/02/2013, 09/15/2018   PFIZER(Purple Top)SARS-COV-2 Vaccination 06/30/2020, 07/21/2020   Pneumococcal Conjugate-13 08/05/2018   Pneumococcal-Unspecified 09/02/2013    Past Medical History:  Diagnosis Date   Anxiety    Chronic lower back pain    Constipation    h/o fecal disimpaction on 2017   CVA (cerebral vascular accident) ([place]) 2015   neg workup, MRI small acute lacunar infarcts. ASA only   Depression    Diastolic CHF, chronic (HCC)    grade I, most recent echo Jan 2019   Fatty liver    GERD (gastroesophageal reflux disease)    Hyperlipidemia    Hypertension    Hyponatremia    multiple episodes with encephalopathy, renal thinks SIADH   Hypothyroidism, unspecified    Internal hemorrhoid    NSTEMI (non-ST elevated myocardial infarction) ([place]) 08/28/2016   Palpitations    PFO with atrial septal aneurysm    TTE 2015   Pneumonia 2016  Severe pulmonary arterial systolic hypertension ([place]) 12/25/2017   per echo, on 1L oxgen via [place]    Tobacco History: Social History   Tobacco Use  Smoking Status Never  Smokeless Tobacco Former   Types: Snuff  Tobacco Comments   "used snuff when I was 19"   Counseling given: Not Answered Tobacco comments: "used snuff when I was 19"   Outpatient Medications Prior to Visit  Medication Sig Dispense Refill   albuterol (PROVENTIL) (2.5 MG/3ML) 0.083% nebulizer solution USE 3ML VIA NEBULIZER EVERY 4 HOURS AS NEEDED FOR SHORTNESS OF BREATH OR WHEEZING (Patient taking differently: Take 2.5 mg by nebulization every 4 (four) hours as needed for shortness of breath or wheezing.) 75 mL 2   amLODipine (NORVASC) 10 MG tablet  TAKE 1 TABLET(10 MG) BY MOUTH DAILY (Patient taking differently: Take 10 mg by mouth in the morning.) 90 tablet 3   Aspirin 81 MG EC tablet Take 81 mg by mouth in the morning.     azelastine (ASTELIN) 0.1 % nasal spray Place 1 spray into both nostrils 2 (two) times daily. Use in each nostril as directed 9 mL 0   azithromycin (ZITHROMAX Z-PAK) 250 MG tablet Take 2 tablets by mouth day #1; then 1 tablet daily x 4 additional days 6 tablet 0   cetirizine (ZYRTEC) 10 MG tablet Take 10 mg by mouth daily.     clotrimazole-betamethasone (LOTRISONE) cream Apply 1 application topically 2 (two) times daily. 30 g 2   demeclocycline (DECLOMYCIN) 300 MG tablet Take 0.5 tablets (150 mg total) by mouth 2 (two) times daily. (Patient taking differently: Take 150 mg by mouth 2 (two) times daily. Continuously) 60 tablet 5   diclofenac sodium (VOLTAREN) 1 % GEL Apply 2 g topically 4 (four) times daily. 100 g 5   famotidine (PEPCID) 20 MG tablet Take 1 tablet (20 mg total) by mouth 2 (two) times daily. 90 tablet 3   fluticasone (FLONASE) 50 MCG/ACT nasal spray SHAKE LIQUID AND USE 1 SPRAY IN EACH NOSTRIL TWICE DAILY (Patient taking differently: Place 2 sprays into both nostrils daily.) 16 g 5   glycerin adult 2 g suppository Place 1 suppository rectally as needed for constipation. 12 suppository 2   guaiFENesin (MUCINEX) 600 MG 12 hr tablet Take 1 tablet (600 mg total) by mouth 2 (two) times daily as needed for to loosen phlegm. (Patient taking differently: Take 600 mg by mouth 2 (two) times daily.) 60 tablet 5   hydrALAZINE (APRESOLINE) 50 MG tablet Take 1 tablet (50 mg total) by mouth 3 (three) times daily. 90 tablet 01   ipratropium (ATROVENT) 0.03 % nasal spray USE 2 SPRAYS IN EACH NOSTRIL TWICE DAILY 30 mL 6   lactose free nutrition (BOOST) LIQD Take 237 mLs by mouth 2 (two) times daily between meals.     levothyroxine (SYNTHROID) 50 MCG tablet TAKE 1 TABLET(50 MCG) BY MOUTH DAILY BEFORE BREAKFAST (Patient taking  differently: Take 50 mcg by mouth daily before breakfast.) 90 tablet 3   LORazepam (ATIVAN) 0.5 MG tablet Take 0.5 mg by mouth 2 (two) times daily.     meclizine (ANTIVERT) 25 MG tablet Take 1 tablet (25 mg total) by mouth 3 (three) times daily as needed for dizziness. 30 tablet 0   olopatadine (PATANOL) 0.1 % ophthalmic solution Place 1 drop into both eyes daily as needed for allergies. (Patient taking differently: Place 1 drop into both eyes daily.) 5 mL 5   omeprazole (PRILOSEC) 20 MG capsule Take 20 mg by  mouth 2 (two) times daily.     oxybutynin (DITROPAN) 5 MG tablet Take 5 mg by mouth at bedtime.     OXYGEN Inhale 1-1.5 L/min into the lungs continuous.     oxymetazoline (AFRIN NASAL SPRAY) 0.05 % nasal spray Place 1 spray into both nostrils 2 (two) times daily as needed for congestion. 30 mL 0   polyethylene glycol powder (GLYCOLAX/MIRALAX) 17 GM/SCOOP powder Take 17 g by mouth in the morning.     predniSONE (DELTASONE) 10 MG tablet 2 tablets daily x 5 days 10 tablet 0   Probiotic Product (ALIGN) 4 MG CAPS Take 4 mg by mouth in the morning.     Respiratory Therapy Supplies (NEBULIZER) DEVI Use with nebulized albuterol solution as needed for wheezing, cough or shortness of breath 1 each 0   senna (SENOKOT) 8.6 MG TABS tablet Take 1 tablet (8.6 mg total) by mouth daily. 120 tablet 3   sodium chloride (OCEAN) 0.65 % SOLN nasal spray Place 1 spray into both nostrils daily.     No facility-administered medications prior to visit.   Review of Systems  Review of Systems  Constitutional: Negative.   HENT:  Positive for rhinorrhea.   Respiratory:  Positive for cough. Negative for chest tightness, shortness of breath and wheezing.   Cardiovascular: Negative.   Neurological:  Positive for dizziness and weakness. Negative for tremors, seizures, speech difficulty and numbness.  Psychiatric/Behavioral:  Positive for confusion. Negative for agitation, behavioral problems, decreased concentration and  hallucinations.     Physical Exam  BP 132/64 (BP Location: Left Arm, Patient Position: Sitting, Fernandez Size: Normal)    Pulse (!) 103    Temp 97.9 F (36.6 C) (Oral)    Ht 5\' 2"  (1.575 m)    Wt 167 lb (75.8 kg)    SpO2 100%    BMI 30.54 kg/m  Physical Exam Constitutional:      Appearance: Normal appearance.  HENT:     Head: Normocephalic and atraumatic.     Nose: Rhinorrhea present.  Cardiovascular:     Rate and Rhythm: Normal rate and regular rhythm.  Pulmonary:     Effort: Pulmonary effort is normal. No respiratory distress.     Breath sounds: Normal breath sounds. No stridor. No wheezing, rhonchi or rales.     Comments: CTA Musculoskeletal:     Comments: In [facility]  Neurological:     General: No focal deficit present.     Mental Status: She is alert and oriented to person, place, and time.     Cranial Nerves: No cranial nerve deficit.     Sensory: No sensory deficit.     Motor: Weakness present.     Comments: Pupils equal and reactive   Psychiatric:        Mood and Affect: Mood normal.        Behavior: Behavior normal.     Lab Results:  CBC    Component Value Date/Time   WBC 7.5 12/06/2021 1449   RBC 3.47 (L) 12/06/2021 1449   HGB 9.6 (L) 12/06/2021 1449   HGB 10.2 (L) 09/22/2020 1517   HCT 30.6 (L) 12/06/2021 1449   HCT 33.4 (L) 09/22/2020 1517   PLT 271 12/06/2021 1449   PLT 235 09/22/2020 1517   MCV 88.2 12/06/2021 1449   MCV 93 09/22/2020 1517   MCH 27.7 12/06/2021 1449   MCHC 31.4 12/06/2021 1449   RDW 15.3 12/06/2021 1449   RDW 12.6 09/22/2020 1517   LYMPHSABS 2.1 12/06/2021  1449   LYMPHSABS 2.9 09/22/2020 1517   MONOABS 0.5 12/06/2021 1449   EOSABS 0.1 12/06/2021 1449   EOSABS 0.2 09/22/2020 1517   BASOSABS 0.0 12/06/2021 1449   BASOSABS 0.0 09/22/2020 1517    BMET    Component Value Date/Time   NA 130 (L) 12/06/2021 1449   NA 135 09/22/2020 1517   K 4.1 12/06/2021 1449   CL 89 (L) 12/06/2021 1449   CO2 35 (H) 12/06/2021 1449   GLUCOSE 99  12/06/2021 1449   BUN 7 (L) 12/06/2021 1449   BUN 14 09/22/2020 1517   CREATININE 0.51 12/06/2021 1449   CALCIUM 9.3 12/06/2021 1449   GFRNONAA >60 12/06/2021 1449   GFRAA 87 09/22/2020 1517    BNP    Component Value Date/Time   BNP 538.6 (H) 04/20/2021 0215    ProBNP No results found for: PROBNP  Imaging: CT Head Wo Contrast  Result Date: 12/07/2021 CLINICAL DATA:  Dizziness EXAM: CT HEAD WITHOUT CONTRAST TECHNIQUE: Contiguous axial images were obtained from the base of the skull through the vertex without intravenous contrast. COMPARISON:  04/19/2021 FINDINGS: Brain: Normal anatomic configuration. Parenchymal volume loss is commensurate with the patient's age. Mild periventricular white matter changes are present likely reflecting the sequela of small vessel ischemia. No abnormal intra or extra-axial mass lesion or fluid collection. No abnormal mass effect or midline shift. No evidence of acute intracranial hemorrhage or infarct. Ventricular size is normal. Cerebellum unremarkable. Vascular: No asymmetric hyperdense vasculature at the skull base. Skull: Intact Sinuses/Orbits: Paranasal sinuses are clear. Ocular lenses have been removed. Orbits are otherwise unremarkable. Other: Left mastoid effusion has developed. No superimposed osseous erosion. Middle ear cavities and right mastoid air cells are clear. IMPRESSION: No acute intracranial abnormality. New left mastoid effusion. Electronically Signed   By: Fidela Salisbury M.D.   On: 12/07/2021 03:22   MR BRAIN WO CONTRAST  Result Date: 12/07/2021 CLINICAL DATA:  Weakness, dizziness, stroke suspected EXAM: MRI HEAD WITHOUT CONTRAST TECHNIQUE: Multiplanar, multiecho pulse sequences of the brain and surrounding structures were obtained without intravenous contrast. COMPARISON:  Same-day noncontrast head CT, MR head 12/05/2015 FINDINGS: Brain: There is no evidence of acute intracranial hemorrhage, extra-axial fluid collection, or acute infarct.  Parenchymal volume is normal. The ventricles are normal in size. There are scattered small foci of FLAIR signal abnormality in the subcortical and periventricular white matter likely reflecting sequela of mild chronic white matter microangiopathy. There is no mass lesion.  There is no midline shift. Vascular: Normal flow voids. Skull and upper cervical spine: Normal marrow signal. Sinuses/Orbits: The paranasal sinuses are clear. Bilateral lens implants are in place. The globes and orbits are otherwise unremarkable. Other: The left mastoid air cells and middle ear are opacified, unchanged since 04/19/2021 but new since 2020. IMPRESSION: 1. No acute intracranial pathology. 2. Mild chronic white matter microangiopathy. 3. Left mastoid and middle ear effusions, unchanged since 04/19/2021 but new since 2020. Electronically Signed   By: Valetta Mole M.D.   On: 12/07/2021 15:45     Assessment & Plan:   Confusion - Does not appear to be a new complaint but worse the last several days. She is alert and oriented x3. Responds to verbal cues appropriately. She is unsure of who the president is but her son says she would not typically know that answer. She had imaging of her head in January 2023 that was negative for acute intracranial abnormality, mild chronic white matter microangiopathy. She refused ED evaluation today. She has  no acute signs of stroke on exam. Checking basic labs and urine culture d/t confusion. Referring to neurology for evaluation for dementia and vertigo symptoms.   Aspiration pneumonia ([place]) - At risk for aspiration pneumonia. She has no significant respiratory complaints today, rare dry cough. We will check CXR today.    Chronic respiratory failure with hypoxia and hypercapnia (HCC) - She is consistently wearing Triolgy ventilator at bedtime, confirmed by download. She has no new oxygen requirements. Maintained on 1L supplemental oxygen.   Chronic sinusitis - Rhinitis symptoms improved  after 5 day course of azithromycin and prednisone, however, continues to be persistent symptom for patient and she has been referred to ENT.   40 mins spent on case: > 50% face to face with patient   Martyn Ehrich, NP 12/29/2021

## 2021-12-29 NOTE — Assessment & Plan Note (Signed)
-   She is consistently wearing Triolgy ventilator at bedtime, confirmed by download. She has no new oxygen requirements. Maintained on 1L supplemental oxygen.

## 2021-12-29 NOTE — Addendum Note (Signed)
Addended by: Demetrio Lapping E on: 12/29/2021 04:02 PM   Modules accepted: Orders

## 2021-12-29 NOTE — Assessment & Plan Note (Addendum)
-   Does not appear to be a new complaint but worse the last several days. She is alert and oriented x3. Responds to verbal cues appropriately. She is unsure of who the president is but her son says she would not typically know that answer. She had imaging of her head in January 2023 that was negative for acute intracranial abnormality, mild chronic white matter microangiopathy. She refused ED evaluation today. She has no acute signs of stroke on exam. Checking basic labs and urine culture d/t confusion. Referring to neurology for evaluation for dementia and vertigo symptoms.

## 2021-12-29 NOTE — Assessment & Plan Note (Addendum)
-   At risk for aspiration pneumonia. She has no significant respiratory complaints today, rare dry cough. We will check CXR today.

## 2021-12-29 NOTE — Patient Instructions (Addendum)
Recommendations: Continue to wear Triology ventilator at night while sleeping Continue to wear 1L supplemental oxygen 24/7   Referral: Neurology re: confusion/ needs eval for dementia and vertigo   Orders: POC glucose  Urine culture/UA Labs  CXR   Follow-up: If respiratory symptoms worsen

## 2021-12-30 LAB — BASIC METABOLIC PANEL
BUN: 14 mg/dL (ref 7–25)
CO2: 31 mmol/L (ref 20–32)
Calcium: 10.2 mg/dL (ref 8.6–10.4)
Chloride: 86 mmol/L — ABNORMAL LOW (ref 98–110)
Creat: 0.73 mg/dL (ref 0.60–0.95)
Glucose, Bld: 131 mg/dL — ABNORMAL HIGH (ref 65–99)
Potassium: 4 mmol/L (ref 3.5–5.3)
Sodium: 130 mmol/L — ABNORMAL LOW (ref 135–146)

## 2021-12-30 LAB — CBC WITH DIFFERENTIAL/PLATELET
Absolute Monocytes: 737 cells/uL (ref 200–950)
Basophils Absolute: 23 cells/uL (ref 0–200)
Basophils Relative: 0.3 %
Eosinophils Absolute: 8 cells/uL — ABNORMAL LOW (ref 15–500)
Eosinophils Relative: 0.1 %
HCT: 34.3 % — ABNORMAL LOW (ref 35.0–45.0)
Hemoglobin: 11.2 g/dL — ABNORMAL LOW (ref 11.7–15.5)
Lymphs Abs: 2075 cells/uL (ref 850–3900)
MCH: 27.7 pg (ref 27.0–33.0)
MCHC: 32.7 g/dL (ref 32.0–36.0)
MCV: 84.9 fL (ref 80.0–100.0)
MPV: 10.3 fL (ref 7.5–12.5)
Monocytes Relative: 9.7 %
Neutro Abs: 4758 cells/uL (ref 1500–7800)
Neutrophils Relative %: 62.6 %
Platelets: 341 10*3/uL (ref 140–400)
RBC: 4.04 10*6/uL (ref 3.80–5.10)
RDW: 14.4 % (ref 11.0–15.0)
Total Lymphocyte: 27.3 %
WBC: 7.6 10*3/uL (ref 3.8–10.8)

## 2022-01-01 ENCOUNTER — Encounter: Payer: Self-pay | Admitting: Physician Assistant

## 2022-01-01 NOTE — Progress Notes (Signed)
Patient's labs are baseline. Hg has improved. No WBC. Sodium is baseline for her. CO2 was normal.

## 2022-01-02 ENCOUNTER — Other Ambulatory Visit (INDEPENDENT_AMBULATORY_CARE_PROVIDER_SITE_OTHER): Payer: Medicare Other

## 2022-01-02 DIAGNOSIS — R410 Disorientation, unspecified: Secondary | ICD-10-CM

## 2022-01-02 DIAGNOSIS — R4182 Altered mental status, unspecified: Secondary | ICD-10-CM | POA: Diagnosis not present

## 2022-01-02 LAB — URINALYSIS, ROUTINE W REFLEX MICROSCOPIC
Bilirubin Urine: NEGATIVE
Hgb urine dipstick: NEGATIVE
Ketones, ur: NEGATIVE
Nitrite: NEGATIVE
Specific Gravity, Urine: <=1.005 — AB (ref 1.000–1.030)
Urine Glucose: NEGATIVE
Urobilinogen, UA: 1 (ref 0.0–1.0)
pH: 7.5 (ref 5.0–8.0)

## 2022-01-03 ENCOUNTER — Telehealth: Payer: Self-pay | Admitting: Primary Care

## 2022-01-03 NOTE — Telephone Encounter (Addendum)
Urine culture was positive for leuks, negative for nitrate. Awaiting culture Patient's labs are baseline. Hg has improved. No WBC. Sodium is baseline for her. CO2 was normal.    Patient's son, Jerome(DPR) is aware of results and voiced his understanding. Nothing further needed at this time.

## 2022-01-04 ENCOUNTER — Telehealth: Payer: Self-pay | Admitting: Pulmonary Disease

## 2022-01-04 DIAGNOSIS — I10 Essential (primary) hypertension: Secondary | ICD-10-CM

## 2022-01-04 DIAGNOSIS — R052 Subacute cough: Secondary | ICD-10-CM

## 2022-01-04 LAB — URINE CULTURE
MICRO NUMBER:: 12943467
SPECIMEN QUALITY:: ADEQUATE

## 2022-01-04 MED ORDER — SALINE SPRAY 0.65 % NA SOLN: 1.0000 | Freq: Every day | NASAL | 3 refills | Status: AC

## 2022-01-04 MED ORDER — AMLODIPINE BESYLATE 10 MG PO TABS: ORAL_TABLET | ORAL | 3 refills | Status: AC

## 2022-01-04 NOTE — Telephone Encounter (Signed)
Called and spoke with patient and let her know that refills have been sent in and no questions noted from son or patient. Nothing further

## 2022-01-05 ENCOUNTER — Other Ambulatory Visit: Payer: Self-pay | Admitting: *Deleted

## 2022-01-05 MED ORDER — SULFAMETHOXAZOLE-TRIMETHOPRIM 800-160 MG PO TABS: 1.0000 | ORAL_TABLET | Freq: Two times a day (BID) | ORAL | 0 refills | Status: DC

## 2022-01-05 NOTE — Progress Notes (Signed)
Please let patient/son known her urine culture was positive, please send in bactrim DS 1 tablet twice daily x 5 days

## 2022-01-06 ENCOUNTER — Telehealth: Payer: Self-pay | Admitting: Pulmonary Disease

## 2022-01-06 DIAGNOSIS — N390 Urinary tract infection, site not specified: Secondary | ICD-10-CM

## 2022-01-06 MED ORDER — CIPROFLOXACIN HCL 500 MG PO TABS: 500.0000 mg | ORAL_TABLET | Freq: Two times a day (BID) | ORAL | 0 refills | Status: AC

## 2022-01-06 NOTE — Telephone Encounter (Signed)
Paged by pharmacy due to allergy to sulfa medications for recent bactrim prescription.   Urinalysis shows klebsiella pneumoniae. Due to allergies listed, will treat with ciprofloxacin 500mg  BID x 5 days.   , MD [facility] Office: [phone]   See Amion for personal pager PCCM on call pager [phone] until 7pm. Please call Elink 7p-7a. [phone]

## 2022-01-08 NOTE — Telephone Encounter (Signed)
Thank you :)

## 2022-01-11 ENCOUNTER — Telehealth: Payer: Self-pay | Admitting: Primary Care

## 2022-01-11 ENCOUNTER — Ambulatory Visit: Payer: Medicare Other | Admitting: Physician Assistant

## 2022-01-11 NOTE — Telephone Encounter (Signed)
Called and spoke with pt's son Tammy Fernandez who states that pt is doing better after recent UTI she had. Pt still has some confusion/fogginess as well as having some pain still with urination.  Tammy Fernandez stated that they had heard about referral appt to neurology but they had to cancel it due to the UTI pt had but he said that he will contact neurology back to get the appt rescheduled and I stated to him that they will be able to do eval to see what needs to be done to help with the confusion/fogginess that pt has been experiencing.  Tammy Fernandez stated that pt took last dose of the cipro today 2/9 and he wants to know if anything might be able to be done since she is still having some burning while urinating.  Stated to Tammy Fernandez that BW was not in the office Thursday afternoons and he said that this would be okay to wait until she returned Friday, 2/10. Routing to [employer] for advice.

## 2022-01-11 NOTE — Telephone Encounter (Signed)
Can extend Cipro 500 mg twice daily for 5 days.  Patient needs to follow-up with primary care provider for ongoing urinary tract infection. Make sure she is drinking enough fluids. Please contact office for sooner follow up if symptoms do not improve or worsen or seek emergency care

## 2022-01-11 NOTE — Telephone Encounter (Signed)
Called the pt's son, [name] and there was no answer, LMTCB and will forward to triage to try again.

## 2022-01-11 NOTE — Telephone Encounter (Signed)
Routing to TP with BW being off tomorrow 2/10.

## 2022-01-16 ENCOUNTER — Ambulatory Visit (INDEPENDENT_AMBULATORY_CARE_PROVIDER_SITE_OTHER): Payer: Medicare Other | Admitting: *Deleted

## 2022-01-16 ENCOUNTER — Ambulatory Visit: Payer: Medicare Other | Admitting: Physician Assistant

## 2022-01-16 ENCOUNTER — Ambulatory Visit: Payer: Medicare Other | Admitting: Obstetrics and Gynecology

## 2022-01-16 DIAGNOSIS — R399 Unspecified symptoms and signs involving the genitourinary system: Secondary | ICD-10-CM

## 2022-01-16 LAB — POCT URINALYSIS DIPSTICK
Bilirubin, UA: NEGATIVE
Blood, UA: NEGATIVE
Glucose, UA: NEGATIVE
Ketones, UA: NEGATIVE
Nitrite, UA: NEGATIVE
Protein, UA: NEGATIVE
Spec Grav, UA: 1.01 (ref 1.010–1.025)
Urobilinogen, UA: 0.2 E.U./dL
pH, UA: 7 (ref 5.0–8.0)

## 2022-01-16 NOTE — Progress Notes (Signed)
Pt unable to come in office today.  Pt son was able to collect urine in sample cup given and returned to office for UTI check.    UDip only positive for WBC.   Per Dr Jolayne Panther - continue to monitor for symptoms, follow up with PCP as needed if ongoing concerns.  Pt son made aware of MD recommendations and had no other questions.

## 2022-01-18 ENCOUNTER — Ambulatory Visit: Payer: Medicare Other | Admitting: Physician Assistant

## 2022-01-25 ENCOUNTER — Encounter: Payer: Self-pay | Admitting: Obstetrics

## 2022-01-25 ENCOUNTER — Telehealth (INDEPENDENT_AMBULATORY_CARE_PROVIDER_SITE_OTHER): Payer: Medicare Other | Admitting: Obstetrics

## 2022-01-25 ENCOUNTER — Other Ambulatory Visit: Payer: Self-pay

## 2022-01-25 DIAGNOSIS — B3731 Acute candidiasis of vulva and vagina: Secondary | ICD-10-CM | POA: Diagnosis not present

## 2022-01-25 MED ORDER — FLUCONAZOLE 150 MG PO TABS: 150.0000 mg | ORAL_TABLET | Freq: Once | ORAL | 1 refills | Status: DC

## 2022-01-25 MED ORDER — CLOTRIMAZOLE-BETAMETHASONE 1-0.05 % EX CREA: 1.0000 "application " | TOPICAL_CREAM | Freq: Two times a day (BID) | CUTANEOUS | 0 refills | Status: DC

## 2022-01-25 NOTE — Progress Notes (Signed)
GYNECOLOGY VIRTUAL VISIT ENCOUNTER NOTE  Provider location: Center for [place] at [facility]   Patient location: Home  I connected with Tammy Fernandez on 01/25/22 at  3:10 PM EST by MyChart Video Encounter and verified that I am speaking with the correct person using two identifiers.   I discussed the limitations, risks, security and privacy concerns of performing an evaluation and management service virtually and the availability of in person appointments. I also discussed with the patient that there may be a patient responsible charge related to this service. The patient expressed understanding and agreed to proceed.   History:  Tammy Fernandez is a 88 y.o. No obstetric history on file. female being evaluated today for vaginal pain and itching after taking antibiotic. She denies any abnormal vaginal discharge, bleeding, pelvic pain or other concerns.       Past Medical History:  Diagnosis Date   Anxiety    Chronic lower back pain    Constipation    h/o fecal disimpaction on 2017   CVA (cerebral vascular accident) ([place]) 2015   neg workup, MRI small acute lacunar infarcts. ASA only   Depression    Diastolic CHF, chronic (HCC)    grade I, most recent echo Jan 2019   Fatty liver    GERD (gastroesophageal reflux disease)    Hyperlipidemia    Hypertension    Hyponatremia    multiple episodes with encephalopathy, renal thinks SIADH   Hypothyroidism, unspecified    Internal hemorrhoid    NSTEMI (non-ST elevated myocardial infarction) ([place]) 08/28/2016   Palpitations    PFO with atrial septal aneurysm    TTE 2015   Pneumonia 2016   Severe pulmonary arterial systolic hypertension ([place]) 12/25/2017   per echo, on 1L oxgen via [place]   Past Surgical History:  Procedure Laterality Date   ABDOMINAL HYSTERECTOMY     CATARACT EXTRACTION W/ INTRAOCULAR LENS  IMPLANT, BILATERAL Bilateral    CESAREAN SECTION     TEE WITHOUT CARDIOVERSION N/A 01/19/2014   Procedure: TRANSESOPHAGEAL  ECHOCARDIOGRAM (TEE);  Surgeon: Larey Dresser, MD;  Location: [place];  Service: Cardiovascular;  Laterality: N/A;  [name]   VAGINAL HYSTERECTOMY     The following portions of the patient's history were reviewed and updated as appropriate: allergies, current medications, past family history, past medical history, past social history, past surgical history and problem list.    Review of Systems:  Pertinent items noted in HPI and remainder of comprehensive ROS otherwise negative.  Physical Exam:   General:  Alert, oriented and cooperative. Patient appears to be in no acute distress.  Mental Status: Normal mood and affect. Normal behavior. Normal judgment and thought content.   Respiratory: Normal respiratory effort, no problems with respiration noted  Rest of physical exam deferred due to type of encounter  Labs and Imaging Results for orders placed or performed in visit on 01/16/22 (from the past 336 hour(s))  POCT urinalysis dipstick   Collection Time: 01/16/22 11:40 AM  Result Value Ref Range   Color, UA yellow    Clarity, UA clear    Glucose, UA Negative Negative   Bilirubin, UA negative    Ketones, UA negative    Spec Grav, UA 1.010 1.010 - 1.025   Blood, UA negative    pH, UA 7.0 5.0 - 8.0   Protein, UA Negative Negative   Urobilinogen, UA 0.2 0.2 or 1.0 E.U./dL   Nitrite, UA negative    Leukocytes, UA Small (  1+) (A) Negative   Appearance     Odor     DG Chest 2 View  Result Date: 12/29/2021 CLINICAL DATA:  Concern for aspiration, confusion EXAM: CHEST - 2 VIEW COMPARISON:  04/20/2021 FINDINGS: Frontal and lateral views of the chest demonstrate a stable cardiac silhouette. Stable thoracic aortic ectasia and atherosclerosis. Chronic elevation of the right hemidiaphragm. No airspace disease, effusion, or pneumothorax. Stable colonic interposition right upper quadrant. No acute fractures. IMPRESSION: 1. No acute intrathoracic process. 2. Chronic elevation right  hemidiaphragm. Electronically Signed   By: Randa Ngo M.D.   On: 12/29/2021 16:10       Assessment and Plan:       1. Candidal vulvitis Rx: - clotrimazole-betamethasone (LOTRISONE) cream; Apply 1 application topically 2 (two) times daily.  Dispense: 30 g; Refill: 0  2. Candida vaginitis Rx: - fluconazole (DIFLUCAN) 150 MG tablet; Take 1 tablet (150 mg total) by mouth once for 1 dose.  Dispense: 1 tablet; Refill: 1     I discussed the assessment and treatment plan with the patient. The patient was provided an opportunity to ask questions and all were answered. The patient agreed with the plan and demonstrated an understanding of the instructions.   The patient was advised to call back or seek an in-person evaluation/go to the ED if the symptoms worsen or if the condition fails to improve as anticipated.  I have spent a total of 15 minutes of non-face-to-face time, excluding clinical staff time, reviewing notes and preparing to see patient, ordering tests and/or medications, and counseling the patient.    Baltazar Najjar, MD Center for [facility], [place], [facility] 01/25/22

## 2022-02-05 ENCOUNTER — Telehealth: Payer: Self-pay

## 2022-02-05 NOTE — Telephone Encounter (Signed)
S/w pt's son about diflucan refill, advised there is 1 refill remaining at the pharmacy  ?

## 2022-02-08 ENCOUNTER — Telehealth: Payer: Self-pay | Admitting: Primary Care

## 2022-02-09 NOTE — Telephone Encounter (Signed)
Called pt's son and there was no answer and his VM was full, Will call back.  ?

## 2022-02-09 NOTE — Telephone Encounter (Signed)
Called and spoke with patient's son [name]. He stated that his mother has misplaced her flutter valve and he wanted [place] to send an order to [employer] for a new one. I advised him that we have some in the office and I could place one at the front desk for him. He verbalized understanding and will stop the office today to pick one up. I have placed the device at the front desk.  ? ?Nothing further needed at time of call.  ?

## 2022-03-26 ENCOUNTER — Ambulatory Visit: Payer: Medicare Other

## 2022-03-26 ENCOUNTER — Other Ambulatory Visit: Payer: Self-pay | Admitting: Obstetrics

## 2022-03-26 ENCOUNTER — Other Ambulatory Visit: Payer: Self-pay

## 2022-03-26 ENCOUNTER — Telehealth: Payer: Self-pay

## 2022-03-26 DIAGNOSIS — N898 Other specified noninflammatory disorders of vagina: Secondary | ICD-10-CM

## 2022-03-26 DIAGNOSIS — N390 Urinary tract infection, site not specified: Secondary | ICD-10-CM

## 2022-03-26 DIAGNOSIS — B3731 Acute candidiasis of vulva and vagina: Secondary | ICD-10-CM

## 2022-03-26 NOTE — Telephone Encounter (Signed)
Front desk advised that son had previously brought in urine sample for patient and that he was here again today to receive urine cup and will return with sample ?S/w patient's son, who is authorized, and was advised that the patient has recently had increasing confusion which is what previously occurred when she had a UTI. Son also stated that home care nurse advised that pt has vaginal irritation, redness. ?

## 2022-03-26 NOTE — Progress Notes (Addendum)
Stopped in hallway by front desk states triage approved son to drop off urine sample. Son reports his mother c/o vaginal irritation.  ?

## 2022-03-28 LAB — URINE CULTURE

## 2022-04-09 NOTE — Telephone Encounter (Signed)
NA

## 2022-05-10 ENCOUNTER — Emergency Department (HOSPITAL_COMMUNITY): Payer: Medicare Other

## 2022-05-10 ENCOUNTER — Encounter (HOSPITAL_COMMUNITY): Payer: Self-pay

## 2022-05-10 ENCOUNTER — Other Ambulatory Visit: Payer: Self-pay

## 2022-05-10 ENCOUNTER — Inpatient Hospital Stay (HOSPITAL_COMMUNITY)
Admission: EM | Admit: 2022-05-10 | Discharge: 2022-06-02 | DRG: 951 | Disposition: E | Payer: Medicare Other | Attending: Internal Medicine | Admitting: Internal Medicine

## 2022-05-10 DIAGNOSIS — E875 Hyperkalemia: Secondary | ICD-10-CM | POA: Diagnosis present

## 2022-05-10 DIAGNOSIS — I130 Hypertensive heart and chronic kidney disease with heart failure and stage 1 through stage 4 chronic kidney disease, or unspecified chronic kidney disease: Secondary | ICD-10-CM | POA: Diagnosis present

## 2022-05-10 DIAGNOSIS — Z87891 Personal history of nicotine dependence: Secondary | ICD-10-CM

## 2022-05-10 DIAGNOSIS — E039 Hypothyroidism, unspecified: Secondary | ICD-10-CM | POA: Diagnosis present

## 2022-05-10 DIAGNOSIS — Z79899 Other long term (current) drug therapy: Secondary | ICD-10-CM

## 2022-05-10 DIAGNOSIS — Z8249 Family history of ischemic heart disease and other diseases of the circulatory system: Secondary | ICD-10-CM

## 2022-05-10 DIAGNOSIS — J9621 Acute and chronic respiratory failure with hypoxia: Secondary | ICD-10-CM | POA: Diagnosis present

## 2022-05-10 DIAGNOSIS — N189 Chronic kidney disease, unspecified: Secondary | ICD-10-CM | POA: Diagnosis present

## 2022-05-10 DIAGNOSIS — Z800 Family history of malignant neoplasm of digestive organs: Secondary | ICD-10-CM

## 2022-05-10 DIAGNOSIS — K219 Gastro-esophageal reflux disease without esophagitis: Secondary | ICD-10-CM | POA: Diagnosis present

## 2022-05-10 DIAGNOSIS — G4733 Obstructive sleep apnea (adult) (pediatric): Secondary | ICD-10-CM | POA: Diagnosis present

## 2022-05-10 DIAGNOSIS — I10 Essential (primary) hypertension: Secondary | ICD-10-CM | POA: Diagnosis not present

## 2022-05-10 DIAGNOSIS — I252 Old myocardial infarction: Secondary | ICD-10-CM

## 2022-05-10 DIAGNOSIS — Z823 Family history of stroke: Secondary | ICD-10-CM | POA: Diagnosis not present

## 2022-05-10 DIAGNOSIS — J449 Chronic obstructive pulmonary disease, unspecified: Secondary | ICD-10-CM | POA: Diagnosis present

## 2022-05-10 DIAGNOSIS — E871 Hypo-osmolality and hyponatremia: Secondary | ICD-10-CM | POA: Diagnosis present

## 2022-05-10 DIAGNOSIS — Z515 Encounter for palliative care: Secondary | ICD-10-CM | POA: Diagnosis not present

## 2022-05-10 DIAGNOSIS — K56609 Unspecified intestinal obstruction, unspecified as to partial versus complete obstruction: Secondary | ICD-10-CM | POA: Diagnosis present

## 2022-05-10 DIAGNOSIS — Z66 Do not resuscitate: Secondary | ICD-10-CM | POA: Diagnosis present

## 2022-05-10 DIAGNOSIS — Z8673 Personal history of transient ischemic attack (TIA), and cerebral infarction without residual deficits: Secondary | ICD-10-CM | POA: Diagnosis not present

## 2022-05-10 DIAGNOSIS — J690 Pneumonitis due to inhalation of food and vomit: Secondary | ICD-10-CM | POA: Diagnosis present

## 2022-05-10 DIAGNOSIS — Z87440 Personal history of urinary (tract) infections: Secondary | ICD-10-CM

## 2022-05-10 DIAGNOSIS — Z9989 Dependence on other enabling machines and devices: Secondary | ICD-10-CM | POA: Diagnosis not present

## 2022-05-10 DIAGNOSIS — Z9981 Dependence on supplemental oxygen: Secondary | ICD-10-CM | POA: Diagnosis not present

## 2022-05-10 DIAGNOSIS — I5042 Chronic combined systolic (congestive) and diastolic (congestive) heart failure: Secondary | ICD-10-CM | POA: Diagnosis present

## 2022-05-10 LAB — CBC WITH DIFFERENTIAL/PLATELET
Abs Immature Granulocytes: 0.03 10*3/uL (ref 0.00–0.07)
Basophils Absolute: 0 10*3/uL (ref 0.0–0.1)
Basophils Relative: 0 %
Eosinophils Absolute: 0 10*3/uL (ref 0.0–0.5)
Eosinophils Relative: 0 %
HCT: 33.4 % — ABNORMAL LOW (ref 36.0–46.0)
Hemoglobin: 11.1 g/dL — ABNORMAL LOW (ref 12.0–15.0)
Immature Granulocytes: 1 %
Lymphocytes Relative: 7 %
Lymphs Abs: 0.4 10*3/uL — ABNORMAL LOW (ref 0.7–4.0)
MCH: 27.9 pg (ref 26.0–34.0)
MCHC: 33.2 g/dL (ref 30.0–36.0)
MCV: 83.9 fL (ref 80.0–100.0)
Monocytes Absolute: 0.2 10*3/uL (ref 0.1–1.0)
Monocytes Relative: 4 %
Neutro Abs: 4.7 10*3/uL (ref 1.7–7.7)
Neutrophils Relative %: 88 %
Platelets: 296 10*3/uL (ref 150–400)
RBC: 3.98 MIL/uL (ref 3.87–5.11)
RDW: 14.3 % (ref 11.5–15.5)
WBC: 5.3 10*3/uL (ref 4.0–10.5)
nRBC: 0 % (ref 0.0–0.2)

## 2022-05-10 LAB — COMPREHENSIVE METABOLIC PANEL
ALT: 12 U/L (ref 0–44)
AST: 51 U/L — ABNORMAL HIGH (ref 15–41)
Albumin: 3.9 g/dL (ref 3.5–5.0)
Alkaline Phosphatase: 74 U/L (ref 38–126)
Anion gap: 14 (ref 5–15)
BUN: 19 mg/dL (ref 8–23)
CO2: 29 mmol/L (ref 22–32)
Calcium: 10.5 mg/dL — ABNORMAL HIGH (ref 8.9–10.3)
Chloride: 79 mmol/L — ABNORMAL LOW (ref 98–111)
Creatinine, Ser: 1.33 mg/dL — ABNORMAL HIGH (ref 0.44–1.00)
GFR, Estimated: 37 mL/min — ABNORMAL LOW (ref >60)
Glucose, Bld: 140 mg/dL — ABNORMAL HIGH (ref 70–99)
Potassium: 5.7 mmol/L — ABNORMAL HIGH (ref 3.5–5.1)
Sodium: 122 mmol/L — ABNORMAL LOW (ref 135–145)
Total Bilirubin: 1.4 mg/dL — ABNORMAL HIGH (ref 0.3–1.2)
Total Protein: 8.6 g/dL — ABNORMAL HIGH (ref 6.5–8.1)

## 2022-05-10 LAB — BRAIN NATRIURETIC PEPTIDE: B Natriuretic Peptide: 81.5 pg/mL (ref 0.0–100.0)

## 2022-05-10 LAB — LACTIC ACID, PLASMA: Lactic Acid, Venous: 3 mmol/L (ref 0.5–1.9)

## 2022-05-10 LAB — MAGNESIUM: Magnesium: 1.9 mg/dL (ref 1.7–2.4)

## 2022-05-10 MED ORDER — SODIUM CHLORIDE 0.9 % IV BOLUS
500.0000 mL | Freq: Once | INTRAVENOUS | Status: AC
  Administered 2022-05-10: 500 mL via INTRAVENOUS

## 2022-05-10 MED ORDER — METRONIDAZOLE 500 MG/100ML IV SOLN
500.0000 mg | Freq: Two times a day (BID) | INTRAVENOUS | Status: DC
  Administered 2022-05-11: 500 mg via INTRAVENOUS
  Filled 2022-05-10: qty 100

## 2022-05-10 MED ORDER — LIDOCAINE HCL URETHRAL/MUCOSAL 2 % EX GEL
1.0000 "application " | Freq: Once | CUTANEOUS | Status: AC
  Administered 2022-05-10: 1 via TOPICAL
  Filled 2022-05-10: qty 11

## 2022-05-10 MED ORDER — ONDANSETRON HCL 4 MG/2ML IJ SOLN
4.0000 mg | Freq: Once | INTRAMUSCULAR | Status: AC
  Administered 2022-05-10: 4 mg via INTRAVENOUS
  Filled 2022-05-10: qty 2

## 2022-05-10 MED ORDER — SODIUM CHLORIDE 0.9 % IV SOLN
2.0000 g | INTRAVENOUS | Status: DC
  Administered 2022-05-11: 2 g via INTRAVENOUS
  Filled 2022-05-10: qty 12.5

## 2022-05-10 MED ORDER — IOHEXOL 300 MG/ML  SOLN
100.0000 mL | Freq: Once | INTRAMUSCULAR | Status: AC | PRN
  Administered 2022-05-10: 80 mL via INTRAVENOUS

## 2022-05-10 NOTE — Assessment & Plan Note (Addendum)
Patient risk of recurrent aspiration pneumonia with recent aspiration. Cover for now with cefepime/ flagyl

## 2022-05-10 NOTE — ED Provider Notes (Signed)
[facility]-EMERGENCY DEPT Provider Note   CSN: [id number] Arrival date & time: 05/28/2022  1532     History  Chief Complaint  Patient presents with   Abdominal Pain   Constipation    Tammy Fernandez is a 88 y.o. female.  HPI Patient presents with her son who provides much of the history.  Patient has very poor hearing.  Patient has multiple medical other problems including CHF, is on diuretics and fluid restricted as result of this.  She notes and he notes ongoing issues with constipation.  Though she has had some stool production each of the past 2 days she complains of abdominal pain, and shortness of breath.    Home Medications Prior to Admission medications   Medication Sig Start Date End Date Taking? Authorizing Provider  albuterol (PROVENTIL) (2.5 MG/3ML) 0.083% nebulizer solution USE VIA NEBULIZER EVERY 4 HOURS AS NEEDED FOR SHORTNESS OF BREATH OR WHEEZING Patient taking differently: Take 2.5 mg by nebulization every 4 (four) hours as needed for shortness of breath or wheezing. 01/20/21   Just, Azalee Course, FNP  amLODipine (NORVASC) 10 MG tablet TAKE 1 TABLET(10 MG) BY MOUTH DAILY 01/04/22   Glenford Bayley, NP  Aspirin 81 MG EC tablet Take 81 mg by mouth in the morning.    [provider]  azelastine (ASTELIN) 0.1 % nasal spray Place 1 spray into both nostrils 2 (two) times daily. Use in each nostril as directed 12/07/21 01/06/22  Jacalyn Lefevre, MD  azithromycin (ZITHROMAX Z-PAK) 250 MG tablet Take 2 tablets by mouth day #1; then 1 tablet daily x 4 additional days 12/21/21   Glenford Bayley, NP  cetirizine (ZYRTEC) 10 MG tablet Take 10 mg by mouth daily.    [provider]  ciprofloxacin (CIPRO) 500 MG tablet Take 1 tablet (500 mg total) by mouth 2 (two) times daily. 01/06/22   Martina Sinner, MD  clotrimazole-betamethasone (LOTRISONE) cream APPLY TOPICALLY TO THE AFFECTED AREA TWICE DAILY 03/26/22   Brock Bad, MD  demeclocycline  (DECLOMYCIN) 300 MG tablet Take 0.5 tablets (150 mg total) by mouth 2 (two) times daily. Patient taking differently: Take 150 mg by mouth 2 (two) times daily. Continuously 11/17/20   Just, Azalee Course, FNP  diclofenac sodium (VOLTAREN) 1 % GEL Apply 2 g topically 4 (four) times daily. 06/02/19   Doristine Bosworth, MD  famotidine (PEPCID) 20 MG tablet Take 1 tablet (20 mg total) by mouth 2 (two) times daily. 09/22/20   Just, Azalee Course, FNP  fluconazole (DIFLUCAN) 150 MG tablet TAKE 1 TABLET(150 MG) BY MOUTH 1 TIME FOR 1 DOSE 03/26/22   Brock Bad, MD  fluticasone (FLONASE) 50 MCG/ACT nasal spray SHAKE LIQUID AND USE 1 SPRAY IN EACH NOSTRIL TWICE DAILY Patient taking differently: Place 2 sprays into both nostrils daily. 12/16/20   Just, Azalee Course, FNP  glycerin adult 2 g suppository Place 1 suppository rectally as needed for constipation. 01/27/19   Lezlie Lye, Meda Coffee, MD  guaiFENesin (MUCINEX) 600 MG 12 hr tablet Take 1 tablet (600 mg total) by mouth 2 (two) times daily as needed for to loosen phlegm. Patient taking differently: Take 600 mg by mouth 2 (two) times daily. 08/19/20   Noni Saupe, MD  hydrALAZINE (APRESOLINE) 50 MG tablet Take 1 tablet (50 mg total) by mouth 3 (three) times daily. 04/24/21   Azucena Fallen, MD  ipratropium (ATROVENT) 0.03 % nasal spray USE 2 SPRAYS IN EACH NOSTRIL  TWICE DAILY 04/01/20   Doristine Bosworth, MD  lactose free nutrition (BOOST) LIQD Take 237 mLs by mouth 2 (two) times daily between meals.    [provider]  levothyroxine (SYNTHROID) 50 MCG tablet TAKE 1 TABLET(50 MCG) BY MOUTH DAILY BEFORE BREAKFAST Patient taking differently: Take 50 mcg by mouth daily before breakfast. 09/22/20   Just, Azalee Course, FNP  LORazepam (ATIVAN) 0.5 MG tablet Take 0.5 mg by mouth 2 (two) times daily. 06/28/21   [provider]  olopatadine (PATANOL) 0.1 % ophthalmic solution Place 1 drop into both eyes daily as needed for allergies. Patient taking  differently: Place 1 drop into both eyes daily. 02/08/21   Just, Azalee Course, FNP  omeprazole (PRILOSEC) 20 MG capsule Take 20 mg by mouth 2 (two) times daily.    [provider]  oxybutynin (DITROPAN) 5 MG tablet Take 5 mg by mouth at bedtime. 01/10/21   [provider]  OXYGEN Inhale 1-1.5 L/min into the lungs continuous.    [provider]  oxymetazoline (AFRIN NASAL SPRAY) 0.05 % nasal spray Place 1 spray into both nostrils 2 (two) times daily as needed for congestion. 12/21/21   Glenford Bayley, NP  polyethylene glycol powder (GLYCOLAX/MIRALAX) 17 GM/SCOOP powder Take 17 g by mouth in the morning.    [provider]  predniSONE (DELTASONE) 10 MG tablet 2 tablets daily x 5 days 12/21/21   Glenford Bayley, NP  Probiotic Product (ALIGN) 4 MG CAPS Take 4 mg by mouth in the morning.    [provider]  Respiratory Therapy Supplies (NEBULIZER) DEVI Use with nebulized albuterol solution as needed for wheezing, cough or shortness of breath 08/17/20   Lezlie Lye, Meda Coffee, MD  senna (SENOKOT) 8.6 MG TABS tablet Take 1 tablet (8.6 mg total) by mouth daily. 09/22/20   Just, Azalee Course, FNP  sodium chloride (OCEAN) 0.65 % SOLN nasal spray Place 1 spray into both nostrils daily. 01/04/22   Glenford Bayley, NP      Allergies    Crestor [rosuvastatin calcium], Keflex [cephalexin], Peanut butter flavor, Sulfa antibiotics, Cephalexin, Peanut-containing drug products, Rosuvastatin, and Sulfa drugs cross reactors    Review of Systems   Review of Systems  All other systems reviewed and are negative.   Physical Exam Updated Vital Signs BP (!) 143/62 (BP Location: Left Arm)   Pulse 77   Temp 97.6 F (36.4 C) (Oral)   Resp 20   Ht 5\' 2"  (1.575 m)   Wt 75 kg   SpO2 96%   BMI 30.24 kg/m  Physical Exam Vitals and nursing note reviewed.  Constitutional:      General: She is not in acute distress. HENT:     Head: Normocephalic and atraumatic.  Eyes:      Conjunctiva/sclera: Conjunctivae normal.  Cardiovascular:     Rate and Rhythm: Normal rate and regular rhythm.  Pulmonary:     Effort: Pulmonary effort is normal. No respiratory distress.     Breath sounds: Normal breath sounds. No stridor.  Abdominal:     General: There is no distension.     Tenderness: There is generalized abdominal tenderness. There is no guarding.  Skin:    General: Skin is warm and dry.  Neurological:     Mental Status: She is alert and oriented to person, place, and time.     Cranial Nerves: No cranial nerve deficit.  Psychiatric:        Mood and Affect: Mood normal.  ED Results / Procedures / Treatments   Labs (all labs ordered are listed, but only abnormal results are displayed) Labs Reviewed  COMPREHENSIVE METABOLIC PANEL  CBC WITH DIFFERENTIAL/PLATELET  MAGNESIUM  BRAIN NATRIURETIC PEPTIDE    EKG None  Radiology No results found.  Procedures Procedures    Medications Ordered in ED Medications - No data to display  ED Course/ Medical Decision Making/ A&P This patient with a Hx of CHF, on home oxygen presents to the ED for concern of abdominal pain, shortness of breath, possible constipation, this involves an extensive number of treatment options, and is a complaint that carries with it a high risk of complications and morbidity.    The differential diagnosis includes drug-induced constipation, dehydration, obstruction, CHF exacerbation with anasarca   Social Determinants of Health:  Age, prior smoking  Additional history obtained:  Additional history and/or information obtained from son, notable for HPI above   After the initial evaluation, orders, including: Labs x-ray were initiated.   Patient placed on Cardiac and Pulse-Oximetry Monitors. The patient was maintained on a cardiac monitor.  The cardiac monitored showed an rhythm of 75 sinus normal The patient was also maintained on pulse oximetry. The readings were typically  100% 2 L nasal cannula abnormal   On repeat evaluation of the patient stayed the same  Lab Tests:  I personally interpreted labs.  The pertinent results include: Hyponatremia, elevated creatinine normal BNP  Imaging Studies ordered:  I independently visualized and interpreted imaging which showed initial x-ray performed after initial concern with dyspnea, abdominal pain, concern for constipation demonstrate substantial amounts of air-filled bowel loops, concerning for obstruction, CT scan ordered.  Subsequent CT scan, notable for pneumatosis, bowel obstruction, transition point, and I discussed this with the radiologist myself.  I agree with the radiologist interpretation  Consultations Obtained:  I requested consultation with the general surgery,  and discussed lab and imaging findings as well as pertinent plan - they recommend: Consideration of NG tube placement, admission, not emergent surgery  Dispostion / Final MDM:  After consideration of the diagnostic results and the patient's response to treatment, patient is being admitted. This elderly female with multiple medical issues presents with her son who provides much of the history and coordinates discussion with daughter who is the power of attorney.  In essence patient found to have bowel obstruction, with substantial amount of dilated bowel loops, pneumatosis.  Patient's labs notable for hyponatremia, poor renal function.  This latter finding I discussed with radiology prior to CT imaging for characterization of bowel obstruction. After lengthy conversation with the patient's family with confirmation of DNR status, the patient will continue receiving fluids, supportive care.  An attempt at placing an NG tube was transiently successful with production of gastric contacts, but the patient was vomiting, possibly aspirated, required substantially more oxygen than she did on arrival, transiently.  Final Clinical Impression(s) / ED  Diagnoses Final diagnoses:  SBO (small bowel obstruction) (HCC)  Hyponatremia   CRITICAL CARE Performed by: Gerhard Munchobert Auriella Wieand Total critical care time: 35 minutes Critical care time was exclusive of separately billable procedures and treating other patients. Critical care was necessary to treat or prevent imminent or life-threatening deterioration. Critical care was time spent personally by me on the following activities: development of treatment plan with patient and/or surrogate as well as nursing, discussions with consultants, evaluation of patient's response to treatment, examination of patient, obtaining history from patient or surrogate, ordering and performing treatments and interventions, ordering and review of  laboratory studies, ordering and review of radiographic studies, pulse oximetry and re-evaluation of patient's condition.    Gerhard Munch, MD 2022-05-12 [phone]

## 2022-05-10 NOTE — Assessment & Plan Note (Signed)
Allow permissive hypertension for tonight 

## 2022-05-10 NOTE — Progress Notes (Signed)
Pharmacy Antibiotic Note  Tammy Fernandez is a 88 y.o. female admitted on June 02, 2022 with aspiration pneumonia and small bowel obstruction.  Pharmacy has been consulted for Cefepime dosing.  Patient with noted cephalexin allergy with reaction of "dizziness."  Epic records show patient has received ceftriaxone in the past.  Plan: Cefepime 2gm IV q24h Flagyl 500mg  IV q12h Follow renal function  Height: 5\' 2"  (157.5 cm) Weight: 75 kg (165 lb 5.5 oz) IBW/kg (Calculated) : 50.1  Temp (24hrs), Avg:97.6 F (36.4 C), Min:97.6 F (36.4 C), Max:97.6 F (36.4 C)  Recent Labs  Lab 2022/06/02 1729 06-02-2022 2203  WBC 5.3  --   CREATININE 1.33*  --   LATICACIDVEN  --  3.0*    Estimated Creatinine Clearance: 24 mL/min (A) (by C-G formula based on SCr of 1.33 mg/dL (H)).    Allergies  Allergen Reactions   Crestor [Rosuvastatin Calcium] Other (See Comments)    Muscle Aches   Keflex [Cephalexin] Other (See Comments)    Dizziness    Peanut Butter Flavor Hives   Sulfa Antibiotics Hives and Rash   Cephalexin Other (See Comments)    Dizziness    Peanut-Containing Drug Products Hives   Rosuvastatin Other (See Comments)    Muscle aches   Sulfa Drugs Cross Reactors Hives and Rash    Antimicrobials this admission: 6/9 cefepime >>   6/9 metronidazole >>    Dose adjustments this admission:    Microbiology results:    Thank you for allowing pharmacy to be a part of this patient's care.  8/9, PharmD 06-02-22 11:49 PM

## 2022-05-10 NOTE — Subjective & Objective (Signed)
Was brought for severe abd pain and constipation  Nausea vomiting CT showed bowel obstruction

## 2022-05-10 NOTE — Consult Note (Signed)
CC: Abdominal pain with constipation  HPI: REALYNN Fernandez is an 88 y.o. female who was brought in by EMS for several day history of worsening abdominal pain with constipation.  The patient was unable to give much history so most of the history is obtained by the chart and a son of hers who is present in the room.  Her past medical history includes chronic hyponatremia, history of stroke, diastolic heart failure, MI, and COPD on home oxygen.  I believe she is currently a DNR.  She has had a previous abdominal hysterectomy.  There does not seem to be a previous history of bowel obstruction.  Surgery has been asked to evaluate the patient after the CT scan showing a small bowel obstruction.  Patient initially had a nasogastric tube placed for emesis.  Placement was uneventful but after it was placed to suction the patient began profusely vomiting and her saturations dropped in the 40s.  She was placed on a nonrebreather and the NG tube was removed and her sats returned to the upper 90s on the nonrebreather.  Past Medical History:  Diagnosis Date   Anxiety    Chronic lower back pain    Constipation    h/o fecal disimpaction on 2017   CVA (cerebral vascular accident) (HCC) 2015   neg workup, MRI small acute lacunar infarcts. ASA only   Depression    Diastolic CHF, chronic (HCC)    grade I, most recent echo Jan 2019   Fatty liver    GERD (gastroesophageal reflux disease)    Hyperlipidemia    Hypertension    Hyponatremia    multiple episodes with encephalopathy, renal thinks SIADH   Hypothyroidism, unspecified    Internal hemorrhoid    NSTEMI (non-ST elevated myocardial infarction) (HCC) 08/28/2016   Palpitations    PFO with atrial septal aneurysm    TTE 2015   Pneumonia 2016   Severe pulmonary arterial systolic hypertension (HCC) 12/25/2017   per echo, on 1L oxgen via [place]    Past Surgical History:  Procedure Laterality Date   ABDOMINAL HYSTERECTOMY     CATARACT EXTRACTION W/  INTRAOCULAR LENS  IMPLANT, BILATERAL Bilateral    CESAREAN SECTION     TEE WITHOUT CARDIOVERSION N/A 01/19/2014   Procedure: TRANSESOPHAGEAL ECHOCARDIOGRAM (TEE);  Surgeon: Laurey Morale, MD;  Location: [facility] ENDOSCOPY;  Service: Cardiovascular;  Laterality: N/A;  [name]   VAGINAL HYSTERECTOMY      Family History  Problem Relation Age of Onset   Stroke Father    Hypertension Father    Colon cancer Cousin    Colon cancer Other        Aunt   Diabetes Other        aunt and cousins   Heart disease Other        cousins   Healthy Daughter    Healthy Son     Social:  reports that she has never smoked. She has quit using smokeless tobacco.  Her smokeless tobacco use included snuff. She reports that she does not drink alcohol and does not use drugs.  Allergies:  Allergies  Allergen Reactions   Crestor [Rosuvastatin Calcium] Other (See Comments)    Muscle Aches   Keflex [Cephalexin] Other (See Comments)    Dizziness    Peanut Butter Flavor Hives   Sulfa Antibiotics Hives and Rash   Cephalexin Other (See Comments)    Dizziness    Peanut-Containing Drug Products Hives   Rosuvastatin Other (See Comments)  Muscle aches   Sulfa Drugs Cross Reactors Hives and Rash    Medications: I have reviewed the patient's current medications.  Results for orders placed or performed during the hospital encounter of 05/15/2022 (from the past 48 hour(s))  Comprehensive metabolic panel     Status: Abnormal   Collection Time: 05/21/2022  5:29 PM  Result Value Ref Range   Sodium 122 (L) 135 - 145 mmol/L   Potassium 5.7 (H) 3.5 - 5.1 mmol/L   Chloride 79 (L) 98 - 111 mmol/L   CO2 29 22 - 32 mmol/L   Glucose, Bld 140 (H) 70 - 99 mg/dL    Comment: Glucose reference range applies only to samples taken after fasting for at least 8 hours.   BUN 19 8 - 23 mg/dL   Creatinine, Ser 1.33 (H) 0.44 - 1.00 mg/dL   Calcium 10.5 (H) 8.9 - 10.3 mg/dL   Total Protein 8.6 (H) 6.5 - 8.1 g/dL   Albumin 3.9 3.5 -  5.0 g/dL   AST 51 (H) 15 - 41 U/L   ALT 12 0 - 44 U/L   Alkaline Phosphatase 74 38 - 126 U/L   Total Bilirubin 1.4 (H) 0.3 - 1.2 mg/dL   GFR, Estimated 37 (L) >60 mL/min    Comment: (NOTE) Calculated using the CKD-EPI Creatinine Equation (2021)    Anion gap 14 5 - 15    Comment: Performed at [facility], [place] [address], [place], [place] [address]  CBC with Differential     Status: Abnormal   Collection Time: 05/29/2022  5:29 PM  Result Value Ref Range   WBC 5.3 4.0 - 10.5 K/uL   RBC 3.98 3.87 - 5.11 MIL/uL   Hemoglobin 11.1 (L) 12.0 - 15.0 g/dL   HCT 33.4 (L) 36.0 - 46.0 %   MCV 83.9 80.0 - 100.0 fL   MCH 27.9 26.0 - 34.0 pg   MCHC 33.2 30.0 - 36.0 g/dL   RDW 14.3 11.5 - 15.5 %   Platelets 296 150 - 400 K/uL   nRBC 0.0 0.0 - 0.2 %   Neutrophils Relative % 88 %   Neutro Abs 4.7 1.7 - 7.7 K/uL   Lymphocytes Relative 7 %   Lymphs Abs 0.4 (L) 0.7 - 4.0 K/uL   Monocytes Relative 4 %   Monocytes Absolute 0.2 0.1 - 1.0 K/uL   Eosinophils Relative 0 %   Eosinophils Absolute 0.0 0.0 - 0.5 K/uL   Basophils Relative 0 %   Basophils Absolute 0.0 0.0 - 0.1 K/uL   Immature Granulocytes 1 %   Abs Immature Granulocytes 0.03 0.00 - 0.07 K/uL    Comment: Performed at [facility], [place] [address] [place], [place] [address]  Magnesium     Status: None   Collection Time: 05/27/2022  5:29 PM  Result Value Ref Range   Magnesium 1.9 1.7 - 2.4 mg/dL    Comment: Performed at [facility], [place] [address] [place], [place] [address]  Brain natriuretic peptide     Status: None   Collection Time: 05/17/2022  5:29 PM  Result Value Ref Range   B Natriuretic Peptide 81.5 0.0 - 100.0 pg/mL    Comment: Performed at [facility], [place] [address], [place], [place] [address]    DG Chest Port 1 View  Result Date: 05/31/2022 CLINICAL DATA:  Possible aspiration. EXAM: PORTABLE CHEST 1 VIEW COMPARISON:  05/27/2022. FINDINGS: The heart  size and mediastinal contours are  stable. There is atherosclerotic calcification of the aorta. There is marked elevation of the right diaphragm with atelectasis or infiltrate at the right lung base. No effusion or pneumothorax. No acute osseous abnormality. A gas-filled loop of colon is noted in the right upper quadrant, not significantly changed from the prior exam. IMPRESSION: 1. Chronic elevation of the right diaphragm with with atelectasis or infiltrate at the right lung base, not significantly changed from prior exams. 2. Gas-filled loop of colon in the right upper quadrant, unchanged from the prior exam. Electronically Signed   By: Brett Fairy M.D.   On: 05/08/2022 21:20   CT ABDOMEN PELVIS W CONTRAST  Result Date: 05/04/2022 CLINICAL DATA:  Nausea, vomiting and abdominal pain. EXAM: CT ABDOMEN AND PELVIS WITH CONTRAST TECHNIQUE: Multidetector CT imaging of the abdomen and pelvis was performed using the standard protocol following bolus administration of intravenous contrast. RADIATION DOSE REDUCTION: This exam was performed according to the departmental dose-optimization program which includes automated exposure control, adjustment of the mA and/or kV according to patient size and/or use of iterative reconstruction technique. CONTRAST:  7mL OMNIPAQUE IOHEXOL 300 MG/ML  SOLN COMPARISON:  None Available. FINDINGS: Lower chest: There is moderate elevation of the right hemidiaphragm. Visualized lung bases are clear. Hepatobiliary: No focal liver abnormality is seen. No gallstones, gallbladder wall thickening, or biliary dilatation. Pancreas: Unremarkable. No pancreatic ductal dilatation or surrounding inflammatory changes. Spleen: Small in size.  Otherwise within normal limits. Adrenals/Urinary Tract: Bilateral renal cortical hypodensities are too small to characterize, but likely cysts. There is a 2.0 cm cyst in the left kidney. There is no hydronephrosis or perinephric fluid. Adrenal glands are within  normal limits. The bladder is completely decompressed. Stomach/Bowel: There is marked dilatation of mid and proximal small bowel loops with air-fluid levels. Stomach is also dilated with air-fluid level. Transition point is seen in the central abdomen where there is some twisting of the mesentery. Findings are concerning for volvulus. Distal small bowel loops are completely decompressed. Colon is completely decompressed. The rectum is dilated and stool-filled compatible with fecal impaction. There is questionable pneumatosis in small bowel loops in the lower abdomen image 2/59. No evidence for free intraperitoneal air or portal venous gas. Vascular/Lymphatic: There are severe atherosclerotic calcifications of the aorta, iliac arteries and branch vessels. Reproductive: Status post hysterectomy. No adnexal masses. Other: No abdominal wall hernia or abnormality. No abdominopelvic ascites. Musculoskeletal: There are mild chronic appearing compression deformities of T11, L1 and L4. The bones are osteopenic. Degenerative changes affect the spine. IMPRESSION: 1. High-grade small-bowel obstruction with transition point in the central abdomen where there is some twisting of the mesentery. Findings may represent volvulus or internal hernia. 2. Questionable pneumatosis within mid small bowel which can be seen with vascular compromise. No free air or portal venous gas. 3. Moderate elevation of the right hemidiaphragm. 4. Rectal fecal impaction. 5.  Aortic Atherosclerosis (ICD10-I70.0). These results were called by telephone at the time of interpretation on 05/29/2022 at 8:51 pm to provider Carmin Muskrat , who verbally acknowledged these results. Electronically Signed   By: Ronney Asters M.D.   On: 05/08/2022 20:51   DG Abdomen Acute W/Chest  Result Date: 05/16/2022 CLINICAL DATA:  Pt bib ems for abd and possible constipation. Pt hx of constipation. Pt states abd pain x2 days per ems per home health . EXAM: DG ABDOMEN ACUTE  WITH 1 VIEW CHEST COMPARISON:  Chest x-ray 12/29/2021 FINDINGS: The heart and mediastinal contours are not well visualized. Query esophageal  gaseous dilatation. Aortic calcification. Marked elevation of the right hemidiaphragm. Right basilar atelectasis. Limited evaluation for focal consolidation. No definite pleural effusion. No pneumothorax. No acute osseous abnormality. Marked elevation of the right hemidiaphragm. Low lung volumes. Marked gaseous dilatation of the small and large bowel. The small bowel measures up to at least 5 cm. The large bowel measures up to at least 7 cm. No definite free intraperitoneal air-limited evaluation. No definite pneumatosis. IMPRESSION: 1. Marked gaseous dilatation of the small and large bowel. Query esophageal gaseous dilatation as well. Findings concerning for obstruction. Recommend enteric tube placement. Recommend CT abdomen pelvis with intravenous contrast for further evaluation given possibility of ischemia. 2. Low lung volumes with markedly elevated right hemidiaphragm. Recommend follow-up chest x-ray with improved inspiration status post decompression of the bowel. 3.  Aortic Atherosclerosis (ICD10-I70.0). These results were called by telephone at the time of interpretation on 05/06/2022 at 8:01 pm to provider Carmin Muskrat , who verbally acknowledged these results. Electronically Signed   By: Iven Finn M.D.   On: 05/13/2022 20:04    ROS -unable to obtain given the patient's current mental status  PE Blood pressure (!) 117/43, pulse 65, temperature 97.6 F (36.4 C), temperature source Oral, resp. rate 20, height 5\' 2"  (1.575 m), weight 75 kg, SpO2 98 %. Constitutional: Lying in bed, appears comfortable.  Follows some commands Eyes: Moist conjunctiva; no lid lag; anicteric; PERRL Neck: Trachea midline; no thyromegaly Lungs: Slight increased respiratory effort no tactile fremitus CV: RRR; no palpable thrills; no pitting edema GI: Abd fairly soft with some  mild diffuse guarding.  No obvious hernias; no palpable hepatosplenomegaly MSK: Moving her arms and legs Psychiatric: Awake and will follow occasional command but somnolent Lymphatic: No palpable cervical or axillary lymphadenopathy  Results for orders placed or performed during the hospital encounter of 05/12/2022 (from the past 48 hour(s))  Comprehensive metabolic panel     Status: Abnormal   Collection Time: 05/25/2022  5:29 PM  Result Value Ref Range   Sodium 122 (L) 135 - 145 mmol/L   Potassium 5.7 (H) 3.5 - 5.1 mmol/L   Chloride 79 (L) 98 - 111 mmol/L   CO2 29 22 - 32 mmol/L   Glucose, Bld 140 (H) 70 - 99 mg/dL    Comment: Glucose reference range applies only to samples taken after fasting for at least 8 hours.   BUN 19 8 - 23 mg/dL   Creatinine, Ser 1.33 (H) 0.44 - 1.00 mg/dL   Calcium 10.5 (H) 8.9 - 10.3 mg/dL   Total Protein 8.6 (H) 6.5 - 8.1 g/dL   Albumin 3.9 3.5 - 5.0 g/dL   AST 51 (H) 15 - 41 U/L   ALT 12 0 - 44 U/L   Alkaline Phosphatase 74 38 - 126 U/L   Total Bilirubin 1.4 (H) 0.3 - 1.2 mg/dL   GFR, Estimated 37 (L) >60 mL/min    Comment: (NOTE) Calculated using the CKD-EPI Creatinine Equation (2021)    Anion gap 14 5 - 15    Comment: Performed at [facility], [place] [address], [place], [place] [address]  CBC with Differential     Status: Abnormal   Collection Time: 05/27/2022  5:29 PM  Result Value Ref Range   WBC 5.3 4.0 - 10.5 K/uL   RBC 3.98 3.87 - 5.11 MIL/uL   Hemoglobin 11.1 (L) 12.0 - 15.0 g/dL   HCT 33.4 (L) 36.0 - 46.0 %   MCV 83.9 80.0 - 100.0 fL  MCH 27.9 26.0 - 34.0 pg   MCHC 33.2 30.0 - 36.0 g/dL   RDW 61.4 43.1 - 54.0 %   Platelets 296 150 - 400 K/uL   nRBC 0.0 0.0 - 0.2 %   Neutrophils Relative % 88 %   Neutro Abs 4.7 1.7 - 7.7 K/uL   Lymphocytes Relative 7 %   Lymphs Abs 0.4 (L) 0.7 - 4.0 K/uL   Monocytes Relative 4 %   Monocytes Absolute 0.2 0.1 - 1.0 K/uL   Eosinophils Relative 0 %   Eosinophils Absolute 0.0 0.0 - 0.5  K/uL   Basophils Relative 0 %   Basophils Absolute 0.0 0.0 - 0.1 K/uL   Immature Granulocytes 1 %   Abs Immature Granulocytes 0.03 0.00 - 0.07 K/uL    Comment: Performed at [facility], [address], [place], [place] [address]  Magnesium     Status: None   Collection Time: 05/15/2022  5:29 PM  Result Value Ref Range   Magnesium 1.9 1.7 - 2.4 mg/dL    Comment: Performed at [facility], [address], [place], [place] [address]  Brain natriuretic peptide     Status: None   Collection Time: 05/25/2022  5:29 PM  Result Value Ref Range   B Natriuretic Peptide 81.5 0.0 - 100.0 pg/mL    Comment: Performed at [facility], [address] [place], [place] [address]    DG Chest Port 1 View  Result Date: 05/16/2022 CLINICAL DATA:  Possible aspiration. EXAM: PORTABLE CHEST 1 VIEW COMPARISON:  05/14/2022. FINDINGS: The heart size and mediastinal contours are stable. There is atherosclerotic calcification of the aorta. There is marked elevation of the right diaphragm with atelectasis or infiltrate at the right lung base. No effusion or pneumothorax. No acute osseous abnormality. A gas-filled loop of colon is noted in the right upper quadrant, not significantly changed from the prior exam. IMPRESSION: 1. Chronic elevation of the right diaphragm with with atelectasis or infiltrate at the right lung base, not significantly changed from prior exams. 2. Gas-filled loop of colon in the right upper quadrant, unchanged from the prior exam. Electronically Signed   By: Thornell Sartorius M.D.   On: 05/30/2022 21:20   CT ABDOMEN PELVIS W CONTRAST  Result Date: 05/09/2022 CLINICAL DATA:  Nausea, vomiting and abdominal pain. EXAM: CT ABDOMEN AND PELVIS WITH CONTRAST TECHNIQUE: Multidetector CT imaging of the abdomen and pelvis was performed using the standard protocol following bolus administration of intravenous contrast. RADIATION DOSE REDUCTION: This exam was  performed according to the departmental dose-optimization program which includes automated exposure control, adjustment of the mA and/or kV according to patient size and/or use of iterative reconstruction technique. CONTRAST:  53mL OMNIPAQUE IOHEXOL 300 MG/ML  SOLN COMPARISON:  None Available. FINDINGS: Lower chest: There is moderate elevation of the right hemidiaphragm. Visualized lung bases are clear. Hepatobiliary: No focal liver abnormality is seen. No gallstones, gallbladder wall thickening, or biliary dilatation. Pancreas: Unremarkable. No pancreatic ductal dilatation or surrounding inflammatory changes. Spleen: Small in size.  Otherwise within normal limits. Adrenals/Urinary Tract: Bilateral renal cortical hypodensities are too small to characterize, but likely cysts. There is a 2.0 cm cyst in the left kidney. There is no hydronephrosis or perinephric fluid. Adrenal glands are within normal limits. The bladder is completely decompressed. Stomach/Bowel: There is marked dilatation of mid and proximal small bowel loops with air-fluid levels. Stomach is also dilated with air-fluid level. Transition point is seen in the central abdomen  where there is some twisting of the mesentery. Findings are concerning for volvulus. Distal small bowel loops are completely decompressed. Colon is completely decompressed. The rectum is dilated and stool-filled compatible with fecal impaction. There is questionable pneumatosis in small bowel loops in the lower abdomen image 2/59. No evidence for free intraperitoneal air or portal venous gas. Vascular/Lymphatic: There are severe atherosclerotic calcifications of the aorta, iliac arteries and branch vessels. Reproductive: Status post hysterectomy. No adnexal masses. Other: No abdominal wall hernia or abnormality. No abdominopelvic ascites. Musculoskeletal: There are mild chronic appearing compression deformities of T11, L1 and L4. The bones are osteopenic. Degenerative changes affect  the spine. IMPRESSION: 1. High-grade small-bowel obstruction with transition point in the central abdomen where there is some twisting of the mesentery. Findings may represent volvulus or internal hernia. 2. Questionable pneumatosis within mid small bowel which can be seen with vascular compromise. No free air or portal venous gas. 3. Moderate elevation of the right hemidiaphragm. 4. Rectal fecal impaction. 5.  Aortic Atherosclerosis (ICD10-I70.0). These results were called by telephone at the time of interpretation on 05/21/2022 at 8:51 pm to provider Carmin Muskrat , who verbally acknowledged these results. Electronically Signed   By: Ronney Asters M.D.   On: 05/30/2022 20:51   DG Abdomen Acute W/Chest  Result Date: 05/07/2022 CLINICAL DATA:  Pt bib ems for abd and possible constipation. Pt hx of constipation. Pt states abd pain x2 days per ems per home health . EXAM: DG ABDOMEN ACUTE WITH 1 VIEW CHEST COMPARISON:  Chest x-ray 12/29/2021 FINDINGS: The heart and mediastinal contours are not well visualized. Query esophageal gaseous dilatation. Aortic calcification. Marked elevation of the right hemidiaphragm. Right basilar atelectasis. Limited evaluation for focal consolidation. No definite pleural effusion. No pneumothorax. No acute osseous abnormality. Marked elevation of the right hemidiaphragm. Low lung volumes. Marked gaseous dilatation of the small and large bowel. The small bowel measures up to at least 5 cm. The large bowel measures up to at least 7 cm. No definite free intraperitoneal air-limited evaluation. No definite pneumatosis. IMPRESSION: 1. Marked gaseous dilatation of the small and large bowel. Query esophageal gaseous dilatation as well. Findings concerning for obstruction. Recommend enteric tube placement. Recommend CT abdomen pelvis with intravenous contrast for further evaluation given possibility of ischemia. 2. Low lung volumes with markedly elevated right hemidiaphragm. Recommend follow-up  chest x-ray with improved inspiration status post decompression of the bowel. 3.  Aortic Atherosclerosis (ICD10-I70.0). These results were called by telephone at the time of interpretation on 05/19/2022 at 8:01 pm to provider Carmin Muskrat , who verbally acknowledged these results. Electronically Signed   By: Iven Finn M.D.   On: 05/23/2022 20:04    I have personally reviewed the relevant CT scan and laboratory data  A/P: Small bowel obstruction  I discussed the diagnosis at length with the patient's son and his sister who I believe is power of attorney.  She has significant comorbidities as well as her significant hyponatremia and hyperkalemia as well as her poor pulmonary status.  Based on her exam, I do not believe she needs emergent surgery.  I believe she is a very poor operative candidate and likely would not come off of ventilator if surgery was needed.  The family is leaning toward comfort care.  I am worried that if we place an NG tube again she might desat requiring intubation.  Without it, however, she is at risk of more emesis and aspiration.  After discussion with the family, they  would like to hold on surgical intervention for now.  I recommended medical admission and palliative care consult.  We will follow with you.  Complex medical decision making  Coralie Keens, MD [facility], [facility]

## 2022-05-10 NOTE — Assessment & Plan Note (Signed)
Make sure patient is on PPI 

## 2022-05-10 NOTE — Assessment & Plan Note (Signed)
Recheck after fluids monitor on telemetry obtain EKG

## 2022-05-10 NOTE — Assessment & Plan Note (Signed)
In the setting of aspiration currently requiring nonrebreather hypoxic down to 40% on her baseline 1.5 L.

## 2022-05-10 NOTE — Assessment & Plan Note (Signed)
Aspirin on hold while n.p.o. and at risk for internal bleeding

## 2022-05-10 NOTE — ED Triage Notes (Signed)
Pt bib ems for abd and possible constipation. Pt hx of constipation. Pt states abd pain x2 days per ems per home health RN.

## 2022-05-10 NOTE — Assessment & Plan Note (Signed)
We will hold off right now as patient is high risk for aspiration

## 2022-05-10 NOTE — ED Notes (Signed)
   Attempted NGT on patient per MD order.  Patient tolerated placement ok and was hooking up suction.  Patient started profusely vomiting and SPO2 dropped to 40's.  Patient seemed less responsive so NGT was pulled and MD called.  Patient placed on NRB and sats coming up.  Dr Fredderick Phenix to bedside assessing the patient.     SPO2 96% on NRB

## 2022-05-10 NOTE — Assessment & Plan Note (Signed)
For now n.p.o. hold Synthroid for tonight

## 2022-05-10 NOTE — Assessment & Plan Note (Addendum)
Chronic hyponatremia obtain urine electrolytes gently rehydrate History of SIADH and demeclocycline but currently appears to be slightly dry

## 2022-05-10 NOTE — H&P (Signed)
bv   TIFFANI CAMPBEL [id number] DOB: [birth year] DOA: 05/09/2022     PCP: Clovia Cuff, MD   Outpatient Specialists:  CARDS:   Erlene Quan, PA-C  Dr. Gwenlyn Found Pulmonary   Dr. Elsworth Soho    Patient arrived to ER on 05/31/2022 at 1532 Referred by Attending Carmin Muskrat, MD   Patient coming from:    home Lives With family  Chief Complaint:  Chief Complaint  Patient presents with   Abdominal Pain   Constipation    HPI: Tammy Fernandez is a 88 y.o. female with medical history significant of COPD on 1.5 O 2, CVA, diastolic CHF, recurrent UTIs history of aspiration pneumonia hypothyroidism SIADH Parkinson disease HTN, constipation, CVA, GERD, hyponatremia, PFO with atrial septal aneurysm    Presented with   nausea vomiting abdominal pain Was brought for severe abd pain and constipation  Nausea vomiting CT showed bowel obstruction  No prior history of small bowel obstruction     Regarding pertinent Chronic problems:        HTN on Norvasc, hydralazine  SIADH on demeclocycline and Pepcid   chronic CHF diastolic/systolic/ combined - last echo 2019 EF [id number] grade 2 diastolic dysfunction      Hypothyroidism:  Lab Results  Component Value Date   TSH 1.759 04/19/2021   on synthroid      COPD -  followed by pulmonology  on baseline oxygen  1.5L, and trilogy    OSA -on nocturnal  CPAP     Hx of CVA -  with/out residual deficits on Aspirin 81 mg,            Dementia - not on meds  Chronic anemia - baseline hg Hemoglobin & Hematocrit  Recent Labs    12/06/21 1449 12/29/21 1606 05/17/2022 1729  HGB 9.6* 11.2* 11.1*     While in ER:    Initial KUB concern for SBO CT has confirm it and was concern for questionable pneumatosis as well as volvulus versus internal hernia General surgery has been consulted have seen patient evaluated NG tube was attempted but patient started to vomit and aspirated O2 saturations dropped to the 40s she started to be on nonrebreather  initial chest x-ray showed no evidence of aspiration but still clinically suspected General surgery believes patient is not a candidate for operative intervention may need palliative care consult and possibly transition to comfort care    CXR - Gas-filled loop of colon in the right upper quadrant, unchanged from the prior exam.  CTabd/pelvis -   High-grade small-bowel obstruction with transition point in the central abdomen where there is some twisting of the mesentery. Findings may represent volvulus or internal hernia. Questionable pneumatosis within mid small bowel which can be seen with vascular compromise. No free air or portal venous gas.  Rectal fecal impaction  Following Medications were ordered in ER: Medications  sodium chloride 0.9 % bolus 500 mL (0 mLs Intravenous Stopped 05/13/2022 1946)  ondansetron (ZOFRAN) injection 4 mg (4 mg Intravenous Given 05/16/2022 1903)  lidocaine (XYLOCAINE) 2 % jelly 1 application  (1 application  Topical Given 05/26/2022 2037)  iohexol (OMNIPAQUE) 300 MG/ML solution 100 mL (80 mLs Intravenous Contrast Given 05/30/2022 2014)  sodium chloride 0.9 % bolus 500 mL (500 mLs Intravenous New Bag/Given 05/15/2022 2149)    _______________________________________________________ ER Provider Called:  general Surgery   Dr.Blackman They Recommend admit to medicine  SEEN in ER   ED Triage Vitals  Enc Vitals Group     BP  05/28/2022 1545 (!) 134/58     Pulse Rate 05/09/2022 1545 75     Resp 05/06/2022 1545 20     Temp 05/23/2022 1545 97.6 F (36.4 C)     Temp Source 05/13/2022 1545 Oral     SpO2 05/09/2022 1542 97 %     Weight 05/03/2022 1543 165 lb 5.5 oz (75 kg)     Height 05/09/2022 1543 5\' 2"  (1.575 m)     Head Circumference --      Peak Flow --      Pain Score 05/27/2022 1543 3     Pain Loc --      Pain Edu? --      Excl. in [place]? --   TMAX(24)@     _________________________________________ Significant initial  Findings: Abnormal Labs Reviewed  COMPREHENSIVE METABOLIC PANEL -  Abnormal; Notable for the following components:      Result Value   Sodium 122 (*)    Potassium 5.7 (*)    Chloride 79 (*)    Glucose, Bld 140 (*)    Creatinine, Ser 1.33 (*)    Calcium 10.5 (*)    Total Protein 8.6 (*)    AST 51 (*)    Total Bilirubin 1.4 (*)    GFR, Estimated 37 (*)    All other components within normal limits  CBC WITH DIFFERENTIAL/PLATELET - Abnormal; Notable for the following components:   Hemoglobin 11.1 (*)    HCT 33.4 (*)    Lymphs Abs 0.4 (*)    All other components within normal limits     ECG: Ordered Personally reviewed by me showing: HR : 62 Rhythm: Sinus rhythm Borderline T abnormalities QTC 442    The recent clinical data is shown below. Vitals:   05/12/2022 2100 05/20/2022 2115 05/03/2022 2130 05/24/2022 2145  BP: (!) 128/46 (!) 126/43 (!) 123/41 (!) 117/43  Pulse: 68 67 65 65  Resp: 18 20 20 20   Temp:      TempSrc:      SpO2: 98% 98% 98% 98%  Weight:      Height:       WBC     Component Value Date/Time   WBC 5.3 05/25/2022 1729   LYMPHSABS 0.4 (L) 05/06/2022 1729   LYMPHSABS 2.9 09/22/2020 1517   MONOABS 0.2 05/27/2022 1729   EOSABS 0.0 05/08/2022 1729   EOSABS 0.2 09/22/2020 1517   BASOSABS 0.0 05/09/2022 1729   BASOSABS 0.0 09/22/2020 1517     Lactic Acid, Venous    Component Value Date/Time   LATICACIDVEN 1.0 01/01/2019 0004      UA   ordered    Results for orders placed or performed in visit on 03/26/22  Urine Culture     Status: None   Collection Time: 03/26/22  3:18 PM   Specimen: Urine, Clean Catch   UR  Result Value Ref Range Status   Urine Culture, Routine Final report  Final   Organism ID, Bacteria Comment  Final    Comment: Mixed urogenital flora 10,000-25,000 colony forming units per mL    _________________________________ Hospitalist was called for admission for SBO   The following Work up has been ordered so far:  Orders Placed This Encounter  Procedures   DG Abdomen Acute W/Chest   CT ABDOMEN  PELVIS W CONTRAST   DG Chest Port 1 View   Comprehensive metabolic panel   CBC with Differential   Magnesium   Brain natriuretic peptide   Lactic acid, plasma  Insert NG/OG (Gastric Tube)   Do not attempt resuscitation/DNR   Consult to general surgery   Consult to hospitalist     OTHER Significant initial  Findings:  labs showing:    Recent Labs  Lab 05/18/2022 1729  NA 122*  K 5.7*  CO2 29  GLUCOSE 140*  BUN 19  CREATININE 1.33*  CALCIUM 10.5*  MG 1.9    Cr Up from baseline see below Lab Results  Component Value Date   CREATININE 1.33 (H) 05/14/2022   CREATININE 0.73 12/29/2021   CREATININE 0.51 12/06/2021    Recent Labs  Lab 05/09/2022 1729  AST 51*  ALT 12  ALKPHOS 74  BILITOT 1.4*  PROT 8.6*  ALBUMIN 3.9   Lab Results  Component Value Date   CALCIUM 10.5 (H) 05/13/2022   PHOS 1.7 (L) 12/24/2017        Plt: Lab Results  Component Value Date   PLT 296 05/09/2022     Recent Labs  Lab 05/29/2022 1729  WBC 5.3  NEUTROABS 4.7  HGB 11.1*  HCT 33.4*  MCV 83.9  PLT 296    HG/HCT   stable,       Component Value Date/Time   HGB 11.1 (L) 05/09/2022 1729   HGB 10.2 (L) 09/22/2020 1517   HCT 33.4 (L) 05/23/2022 1729   HCT 33.4 (L) 09/22/2020 1517   MCV 83.9 05/20/2022 1729   MCV 93 09/22/2020 1517      Cardiac Panel (last 3 results) No results for input(s): "CKTOTAL", "CKMB", "TROPONINI", "RELINDX" in the last 72 hours.  .car BNP (last 3 results) Recent Labs    05/31/2022 1729  BNP 81.5          Cultures:    Component Value Date/Time   SDES BLOOD RIGHT HAND 12/13/2015 0718   SPECREQUEST IN PEDIATRIC BOTTLE 2CC 12/13/2015 0718   CULT NO GROWTH 5 DAYS 12/13/2015 0718   REPTSTATUS 12/18/2015 FINAL 12/13/2015 0718     Radiological Exams on Admission: DG Chest Port 1 View  Result Date: 05/05/2022 CLINICAL DATA:  Possible aspiration. EXAM: PORTABLE CHEST 1 VIEW COMPARISON:  05/09/2022. FINDINGS: The heart size and mediastinal contours  are stable. There is atherosclerotic calcification of the aorta. There is marked elevation of the right diaphragm with atelectasis or infiltrate at the right lung base. No effusion or pneumothorax. No acute osseous abnormality. A gas-filled loop of colon is noted in the right upper quadrant, not significantly changed from the prior exam. IMPRESSION: 1. Chronic elevation of the right diaphragm with with atelectasis or infiltrate at the right lung base, not significantly changed from prior exams. 2. Gas-filled loop of colon in the right upper quadrant, unchanged from the prior exam. Electronically Signed   By: Brett Fairy M.D.   On: 05/18/2022 21:20   CT ABDOMEN PELVIS W CONTRAST  Result Date: 05/16/2022 CLINICAL DATA:  Nausea, vomiting and abdominal pain. EXAM: CT ABDOMEN AND PELVIS WITH CONTRAST TECHNIQUE: Multidetector CT imaging of the abdomen and pelvis was performed using the standard protocol following bolus administration of intravenous contrast. RADIATION DOSE REDUCTION: This exam was performed according to the departmental dose-optimization program which includes automated exposure control, adjustment of the mA and/or kV according to patient size and/or use of iterative reconstruction technique. CONTRAST:  62mL OMNIPAQUE IOHEXOL 300 MG/ML  SOLN COMPARISON:  None Available. FINDINGS: Lower chest: There is moderate elevation of the right hemidiaphragm. Visualized lung bases are clear. Hepatobiliary: No focal liver abnormality is seen. No gallstones, gallbladder wall  thickening, or biliary dilatation. Pancreas: Unremarkable. No pancreatic ductal dilatation or surrounding inflammatory changes. Spleen: Small in size.  Otherwise within normal limits. Adrenals/Urinary Tract: Bilateral renal cortical hypodensities are too small to characterize, but likely cysts. There is a 2.0 cm cyst in the left kidney. There is no hydronephrosis or perinephric fluid. Adrenal glands are within normal limits. The bladder is  completely decompressed. Stomach/Bowel: There is marked dilatation of mid and proximal small bowel loops with air-fluid levels. Stomach is also dilated with air-fluid level. Transition point is seen in the central abdomen where there is some twisting of the mesentery. Findings are concerning for volvulus. Distal small bowel loops are completely decompressed. Colon is completely decompressed. The rectum is dilated and stool-filled compatible with fecal impaction. There is questionable pneumatosis in small bowel loops in the lower abdomen image 2/59. No evidence for free intraperitoneal air or portal venous gas. Vascular/Lymphatic: There are severe atherosclerotic calcifications of the aorta, iliac arteries and branch vessels. Reproductive: Status post hysterectomy. No adnexal masses. Other: No abdominal wall hernia or abnormality. No abdominopelvic ascites. Musculoskeletal: There are mild chronic appearing compression deformities of T11, L1 and L4. The bones are osteopenic. Degenerative changes affect the spine. IMPRESSION: 1. High-grade small-bowel obstruction with transition point in the central abdomen where there is some twisting of the mesentery. Findings may represent volvulus or internal hernia. 2. Questionable pneumatosis within mid small bowel which can be seen with vascular compromise. No free air or portal venous gas. 3. Moderate elevation of the right hemidiaphragm. 4. Rectal fecal impaction. 5.  Aortic Atherosclerosis (ICD10-I70.0). These results were called by telephone at the time of interpretation on 05/29/2022 at 8:51 pm to provider Carmin Muskrat , who verbally acknowledged these results. Electronically Signed   By: Ronney Asters M.D.   On: 05/03/2022 20:51   DG Abdomen Acute W/Chest  Result Date: 05/16/2022 CLINICAL DATA:  Pt bib ems for abd and possible constipation. Pt hx of constipation. Pt states abd pain x2 days per ems per home health . EXAM: DG ABDOMEN ACUTE WITH 1 VIEW CHEST COMPARISON:   Chest x-ray 12/29/2021 FINDINGS: The heart and mediastinal contours are not well visualized. Query esophageal gaseous dilatation. Aortic calcification. Marked elevation of the right hemidiaphragm. Right basilar atelectasis. Limited evaluation for focal consolidation. No definite pleural effusion. No pneumothorax. No acute osseous abnormality. Marked elevation of the right hemidiaphragm. Low lung volumes. Marked gaseous dilatation of the small and large bowel. The small bowel measures up to at least 5 cm. The large bowel measures up to at least 7 cm. No definite free intraperitoneal air-limited evaluation. No definite pneumatosis. IMPRESSION: 1. Marked gaseous dilatation of the small and large bowel. Query esophageal gaseous dilatation as well. Findings concerning for obstruction. Recommend enteric tube placement. Recommend CT abdomen pelvis with intravenous contrast for further evaluation given possibility of ischemia. 2. Low lung volumes with markedly elevated right hemidiaphragm. Recommend follow-up chest x-ray with improved inspiration status post decompression of the bowel. 3.  Aortic Atherosclerosis (ICD10-I70.0). These results were called by telephone at the time of interpretation on 05/13/2022 at 8:01 pm to provider Carmin Muskrat , who verbally acknowledged these results. Electronically Signed   By: Iven Finn M.D.   On: 05/17/2022 20:04   _______________________________________________________________________________________________________ Latest  Blood pressure (!) 117/43, pulse 65, temperature 97.6 F (36.4 C), temperature source Oral, resp. rate 20, height 5\' 2"  (1.575 m), weight 75 kg, SpO2 98 %.   Vitals  labs and radiology finding personally reviewed  Review  of Systems:    Pertinent positives include:  abdominal pain, nausea  Constitutional:  No weight loss, night sweats, Fevers, chills, fatigue, weight loss  HEENT:  No headaches, Difficulty swallowing,Tooth/dental problems,Sore  throat,  No sneezing, itching, ear ache, nasal congestion, post nasal drip,  Cardio-vascular:  No chest pain, Orthopnea, PND, anasarca, dizziness, palpitations.no Bilateral lower extremity swelling  GI:  No heartburn, indigestion, , vomiting, diarrhea, change in bowel habits, loss of appetite, melena, blood in stool, hematemesis Resp:  no shortness of breath at rest. No dyspnea on exertion, No excess mucus, no productive cough, No non-productive cough, No coughing up of blood.No change in color of mucus.No wheezing. Skin:  no rash or lesions. No jaundice GU:  no dysuria, change in color of urine, no urgency or frequency. No straining to urinate.  No flank pain.  Musculoskeletal:  No joint pain or no joint swelling. No decreased range of motion. No back pain.  Psych:  No change in mood or affect. No depression or anxiety. No memory loss.  Neuro: no localizing neurological complaints, no tingling, no weakness, no double vision, no gait abnormality, no slurred speech, no confusion  All systems reviewed and apart from [place] all are negative _______________________________________________________________________________________________ Past Medical History:   Past Medical History:  Diagnosis Date   Anxiety    Chronic lower back pain    Constipation    h/o fecal disimpaction on 2017   CVA (cerebral vascular accident) ([place]) 2015   neg workup, MRI small acute lacunar infarcts. ASA only   Depression    Diastolic CHF, chronic (HCC)    grade I, most recent echo Jan 2019   Fatty liver    GERD (gastroesophageal reflux disease)    Hyperlipidemia    Hypertension    Hyponatremia    multiple episodes with encephalopathy, renal thinks SIADH   Hypothyroidism, unspecified    Internal hemorrhoid    NSTEMI (non-ST elevated myocardial infarction) ([place]) 08/28/2016   Palpitations    PFO with atrial septal aneurysm    TTE 2015   Pneumonia 2016   Severe pulmonary arterial systolic hypertension  ([place]) 12/25/2017   per echo, on 1L oxgen via [place]      Past Surgical History:  Procedure Laterality Date   ABDOMINAL HYSTERECTOMY     CATARACT EXTRACTION W/ INTRAOCULAR LENS  IMPLANT, BILATERAL Bilateral    CESAREAN SECTION     TEE WITHOUT CARDIOVERSION N/A 01/19/2014   Procedure: TRANSESOPHAGEAL ECHOCARDIOGRAM (TEE);  Surgeon: Larey Dresser, MD;  Location: [facility] ENDOSCOPY;  Service: Cardiovascular;  Laterality: N/A;  [name]   VAGINAL HYSTERECTOMY      Social History:  Ambulatory   walker      reports that she has never smoked. She has quit using smokeless tobacco.  Her smokeless tobacco use included snuff. She reports that she does not drink alcohol and does not use drugs.    Family History:   Family History  Problem Relation Age of Onset   Stroke Father    Hypertension Father    Colon cancer Cousin    Colon cancer Other        Aunt   Diabetes Other        aunt and cousins   Heart disease Other        cousins   Healthy Daughter    Healthy Son    ______________________________________________________________________________________________ Allergies: Allergies  Allergen Reactions   Crestor [Rosuvastatin Calcium] Other (See Comments)    Muscle Aches   Keflex [Cephalexin]  Other (See Comments)    Dizziness    Peanut Butter Flavor Hives   Sulfa Antibiotics Hives and Rash   Cephalexin Other (See Comments)    Dizziness    Peanut-Containing Drug Products Hives   Rosuvastatin Other (See Comments)    Muscle aches   Sulfa Drugs Cross Reactors Hives and Rash     Prior to Admission medications   Medication Sig Start Date End Date Taking? Authorizing Provider  albuterol (PROVENTIL) (2.5 MG/3ML) 0.083% nebulizer solution USE 3ML VIA NEBULIZER EVERY 4 HOURS AS NEEDED FOR SHORTNESS OF BREATH OR WHEEZING Patient taking differently: Take 2.5 mg by nebulization every 4 (four) hours as needed for shortness of breath or wheezing. 01/20/21   Just, Laurita Quint, FNP  amLODipine  (NORVASC) 10 MG tablet TAKE 1 TABLET(10 MG) BY MOUTH DAILY 01/04/22   Martyn Ehrich, NP  Aspirin 81 MG EC tablet Take 81 mg by mouth in the morning.    [provider]  azelastine (ASTELIN) 0.1 % nasal spray Place 1 spray into both nostrils 2 (two) times daily. Use in each nostril as directed 12/07/21 01/06/22  Isla Pence, MD  azithromycin (ZITHROMAX Z-PAK) 250 MG tablet Take 2 tablets by mouth day #1; then 1 tablet daily x 4 additional days 12/21/21   Martyn Ehrich, NP  cetirizine (ZYRTEC) 10 MG tablet Take 10 mg by mouth daily.    [provider]  ciprofloxacin (CIPRO) 500 MG tablet Take 1 tablet (500 mg total) by mouth 2 (two) times daily. 01/06/22   Freddi Starr, MD  clotrimazole-betamethasone (LOTRISONE) cream APPLY TOPICALLY TO THE AFFECTED AREA TWICE DAILY 03/26/22   Shelly Bombard, MD  demeclocycline (DECLOMYCIN) 300 MG tablet Take 0.5 tablets (150 mg total) by mouth 2 (two) times daily. Patient taking differently: Take 150 mg by mouth 2 (two) times daily. Continuously 11/17/20   Just, Laurita Quint, FNP  diclofenac sodium (VOLTAREN) 1 % GEL Apply 2 g topically 4 (four) times daily. 06/02/19   Forrest Moron, MD  famotidine (PEPCID) 20 MG tablet Take 1 tablet (20 mg total) by mouth 2 (two) times daily. 09/22/20   Just, Laurita Quint, FNP  fluconazole (DIFLUCAN) 150 MG tablet TAKE 1 TABLET(150 MG) BY MOUTH 1 TIME FOR 1 DOSE 03/26/22   Shelly Bombard, MD  fluticasone (FLONASE) 50 MCG/ACT nasal spray SHAKE LIQUID AND USE 1 SPRAY IN EACH NOSTRIL TWICE DAILY Patient taking differently: Place 2 sprays into both nostrils daily. 12/16/20   Just, Laurita Quint, FNP  glycerin adult 2 g suppository Place 1 suppository rectally as needed for constipation. 01/27/19   Jacelyn Pi, Lilia Argue, MD  guaiFENesin (MUCINEX) 600 MG 12 hr tablet Take 1 tablet (600 mg total) by mouth 2 (two) times daily as needed for to loosen phlegm. Patient taking differently: Take 600 mg by mouth 2 (two) times  daily. 08/19/20   Daleen Squibb, MD  hydrALAZINE (APRESOLINE) 50 MG tablet Take 1 tablet (50 mg total) by mouth 3 (three) times daily. 04/24/21   Little Ishikawa, MD  ipratropium (ATROVENT) 0.03 % nasal spray USE 2 SPRAYS IN EACH NOSTRIL TWICE DAILY 04/01/20   Forrest Moron, MD  lactose free nutrition (BOOST) LIQD Take 237 mLs by mouth 2 (two) times daily between meals.    [provider]  levothyroxine (SYNTHROID) 50 MCG tablet TAKE 1 TABLET(50 MCG) BY MOUTH DAILY BEFORE BREAKFAST Patient taking differently: Take 50 mcg by mouth daily before breakfast. 09/22/20  Just, Laurita Quint, FNP  LORazepam (ATIVAN) 0.5 MG tablet Take 0.5 mg by mouth 2 (two) times daily. 06/28/21   [provider]  olopatadine (PATANOL) 0.1 % ophthalmic solution Place 1 drop into both eyes daily as needed for allergies. Patient taking differently: Place 1 drop into both eyes daily. 02/08/21   Just, Laurita Quint, FNP  omeprazole (PRILOSEC) 20 MG capsule Take 20 mg by mouth 2 (two) times daily.    [provider]  oxybutynin (DITROPAN) 5 MG tablet Take 5 mg by mouth at bedtime. 01/10/21   [provider]  OXYGEN Inhale 1-1.5 L/min into the lungs continuous.    [provider]  oxymetazoline (AFRIN NASAL SPRAY) 0.05 % nasal spray Place 1 spray into both nostrils 2 (two) times daily as needed for congestion. 12/21/21   Martyn Ehrich, NP  polyethylene glycol powder (GLYCOLAX/MIRALAX) 17 GM/SCOOP powder Take 17 g by mouth in the morning.    [provider]  predniSONE (DELTASONE) 10 MG tablet 2 tablets daily x 5 days 12/21/21   Martyn Ehrich, NP  Probiotic Product (ALIGN) 4 MG CAPS Take 4 mg by mouth in the morning.    [provider]  Respiratory Therapy Supplies (NEBULIZER) DEVI Use with nebulized albuterol solution as needed for wheezing, cough or shortness of breath 08/17/20   Jacelyn Pi, Lilia Argue, MD  senna (SENOKOT) 8.6 MG TABS tablet Take 1 tablet (8.6  mg total) by mouth daily. 09/22/20   Just, Laurita Quint, FNP  sodium chloride (OCEAN) 0.65 % SOLN nasal spray Place 1 spray into both nostrils daily. 01/04/22   Martyn Ehrich, NP    ___________________________________________________________________________________________________ Physical Exam:    05/12/2022    9:45 PM 05/22/2022    9:30 PM 05/16/2022    9:15 PM  Vitals with BMI  Systolic [id number]  Diastolic 43 41 43  Pulse 65 65 67     1. General:  in No  Acute distress   Chronically ill   -appearing 2. Psychological: Alert and   Oriented 3. Head/ENT:  Dry Mucous Membranes                          Head Non traumatic, neck supple                          Poor Dentition 4. SKIN: decreased Skin turgor,  Skin clean Dry and intact no rash 5. Heart: Regular rate and rhythm no  Murmur, no Rub or gallop 6. Lungs: no wheezes or crackles   7. Abdomen: Soft,  tender, Non distended bowel sounds present 8. Lower extremities: no clubbing, cyanosis, no  edema 9. Neurologically Grossly intact, moving all 4 extremities equally   10. MSK: Normal range of motion    Chart has been reviewed  ______________________________________________________________________________________________  Assessment/Plan 88 y.o. female with medical history significant of COPD on 1.5 O 2, CVA, diastolic CHF, recurrent UTIs history of aspiration pneumonia hypothyroidism SIADH Parkinson disease HTN, constipation, CVA, GERD, hyponatremia, PFO with atrial septal aneurysm   Admitted for SBO and  aspirational PNA   Present on Admission:  SBO (small bowel obstruction) (HCC)  Acute hyponatremia  Aspiration pneumonia ([place])  Essential hypertension  GERD (gastroesophageal reflux disease)  Hypothyroidism  Acute on chronic respiratory failure with hypoxia (HCC)  Hyperkalemia     Acute hyponatremia Chronic hyponatremia obtain urine electrolytes gently rehydrate History of SIADH and demeclocycline  but currently appears  to be slightly dry  Aspiration pneumonia [facility]) Patient risk of recurrent aspiration pneumonia with recent aspiration. Cover for now with Zosyn  Essential hypertension Allow permissive hypertension for tonight  GERD (gastroesophageal reflux disease) Make sure patient is on PPI  History of CVA (cerebrovascular accident) Aspirin on hold while n.p.o. and at risk for internal bleeding  Hypothyroidism For now n.p.o. hold Synthroid for tonight  OSA on CPAP We will hold off right now as patient is high risk for aspiration  SBO (small bowel obstruction) (HCC)  Likely cause  Adhesions, versus volvulus/internal hernia.  - admit for conservative management  - Patient did not tolerate NG tube will not attempt again - NPO - KUB in AM - appreciate General surgery consult. Would likely benefit from palliative care consult overall very guarded prognosis   Acute on chronic respiratory failure with hypoxia (HCC) In the setting of aspiration currently requiring nonrebreather hypoxic down to 40% on her baseline 1.5 L.   Hyperkalemia Recheck after fluids monitor on telemetry obtain EKG    Other plan as per orders.  DVT prophylaxis:  SCD      Code Status DNR/DNI   as per  family concentrate on comfort ok wth IV fluids antibiotics and IV pain meds as needed I had personally discussed CODE STATUS with patient and family     Family Communication:   Family   at  Bedside  plan of care was discussed   with   Son,    Disposition Plan:      To home once workup is complete and patient is stable   Following barriers for discharge:                            Electrolytes corrected                                                           Pain controlled with PO medications                                                           Will need to be able to tolerate PO                                             Will need consultants to evaluate patient prior to discharge                                     Nutrition    consulted                             Palliative care    consulted  Consults called: general surgery is aware  Admission status:  ED Disposition     ED Disposition  Admit   Condition  --   [place]: [place] [[id number]]  Level of Care: Telemetry [5]  Admit to tele based on following criteria: Other see comments  Comments: hyperkalemia  May admit patient to [facility] or [facility] if equivalent level of care is available:: No  Covid Evaluation: Asymptomatic - no recent exposure (last 10 days) testing not required  Diagnosis: SBO (small bowel obstruction) [id number]  Admitting Physician: Toy Baker [[id number]]  Attending Physician: Toy Baker [[id number]]  Estimated length of stay: past midnight tomorrow  Certification:: I certify this patient will need inpatient services for at least 2 midnights           inpatient     I Expect 2 midnight stay secondary to severity of patient's current illness need for inpatient interventions justified by the following:   * Severe lab/radiological/exam abnormalities including:     SBO and extensive comorbidities including:  CHF    COPD/asthma    CKD   That are currently affecting medical management.   I expect  patient to be hospitalized for 2 midnights requiring inpatient medical care.  Patient is at high risk for adverse outcome (such as loss of life or disability) if not treated.  Indication for inpatient stay as follows:    inability to maintain oral hydration     New or worsening hypoxia   Need for IV antibiotics, IV fluids,      Level of care     tele  For 12H     Lab Results  Component Value Date   [place] NEGATIVE 04/20/2021     Tashala Cumbo 05/08/2022, 11:40 PM    [facility]     after 2 AM please page floor coverage PA If 7AM-7PM, please contact the day team taking care of  the patient using [email or web address]   Patient was evaluated in the context of the global COVID-19 pandemic, which necessitated consideration that the patient might be at risk for infection with the SARS-CoV-2 virus that causes COVID-19. Institutional protocols and algorithms that pertain to the evaluation of patients at risk for COVID-19 are in a state of rapid change based on information released by regulatory bodies including the CDC and federal and state organizations. These policies and algorithms were followed during the patient's care.

## 2022-05-10 NOTE — Assessment & Plan Note (Addendum)
Likely cause  Adhesions, versus volvulus/internal hernia.  - admit for conservative management  - Patient did not tolerate NG tube will not attempt again - NPO - KUB in AM - appreciate General surgery consult. Would likely benefit from palliative care consult overall very guarded prognosis  Add cefepime/ flagyl for intestinal coverage

## 2022-06-02 NOTE — ED Notes (Signed)
Additional family arrived at bedside

## 2022-06-02 NOTE — ED Notes (Signed)
Chaplain paged x2

## 2022-06-02 NOTE — Death Summary Note (Incomplete)
DEATH SUMMARY   Patient Details  Name: Tammy Fernandez MRN: [id number] DOB: Jan 25, 1924 [id number], Loistine Chance, MD Admission/Discharge Information   Admit Date:  05/24/22  Date of Death: Date of Death: 05-25-2022  Time of Death: Time of Death: 0310  Length of Stay: 1   Principle Cause of death: ***  Hospital Diagnoses: Principal Problem:   SBO (small bowel obstruction) (HCC) Active Problems:   GERD (gastroesophageal reflux disease)   Acute hyponatremia   Hyperkalemia   History of CVA (cerebrovascular accident)   Essential hypertension   Aspiration pneumonia (HCC)   Hypothyroidism   Acute on chronic respiratory failure with hypoxia (HCC)   OSA on CPAP   Hospital Course: No notes on file  Assessment and Plan: No notes have been filed under this hospital service. Service: Hospitalist     {Tip this will not be part of the note when signed Body mass index is 30.24 kg/m. , ,  Active Pressure Injury/Wound(s)     Pressure Ulcer  Duration          Pressure Injury 01/07/19 Stage II -  Partial thickness loss of dermis presenting as a shallow open ulcer with a red, pink wound bed without slough. 1226 days           (Optional):26781}   Procedures: ***  Consultations: ***  The results of significant diagnostics from this hospitalization (including imaging, microbiology, ancillary and laboratory) are listed below for reference.   Significant Diagnostic Studies: DG Chest Port 1 View  Result Date: 05-24-22 CLINICAL DATA:  Possible aspiration. EXAM: PORTABLE CHEST 1 VIEW COMPARISON:  2022-05-24. FINDINGS: The heart size and mediastinal contours are stable. There is atherosclerotic calcification of the aorta. There is marked elevation of the right diaphragm with atelectasis or infiltrate at the right lung base. No effusion or pneumothorax. No acute osseous abnormality. A gas-filled loop of colon is noted in the right upper quadrant, not significantly changed from the prior  exam. IMPRESSION: 1. Chronic elevation of the right diaphragm with with atelectasis or infiltrate at the right lung base, not significantly changed from prior exams. 2. Gas-filled loop of colon in the right upper quadrant, unchanged from the prior exam. Electronically Signed   By: Thornell Sartorius M.D.   On: 05-24-22 21:20   CT ABDOMEN PELVIS W CONTRAST  Result Date: 2022-05-24 CLINICAL DATA:  Nausea, vomiting and abdominal pain. EXAM: CT ABDOMEN AND PELVIS WITH CONTRAST TECHNIQUE: Multidetector CT imaging of the abdomen and pelvis was performed using the standard protocol following bolus administration of intravenous contrast. RADIATION DOSE REDUCTION: This exam was performed according to the departmental dose-optimization program which includes automated exposure control, adjustment of the mA and/or kV according to patient size and/or use of iterative reconstruction technique. CONTRAST:  51mL OMNIPAQUE IOHEXOL 300 MG/ML  SOLN COMPARISON:  None Available. FINDINGS: Lower chest: There is moderate elevation of the right hemidiaphragm. Visualized lung bases are clear. Hepatobiliary: No focal liver abnormality is seen. No gallstones, gallbladder wall thickening, or biliary dilatation. Pancreas: Unremarkable. No pancreatic ductal dilatation or surrounding inflammatory changes. Spleen: Small in size.  Otherwise within normal limits. Adrenals/Urinary Tract: Bilateral renal cortical hypodensities are too small to characterize, but likely cysts. There is a 2.0 cm cyst in the left kidney. There is no hydronephrosis or perinephric fluid. Adrenal glands are within normal limits. The bladder is completely decompressed. Stomach/Bowel: There is marked dilatation of mid and proximal small bowel loops with air-fluid levels. Stomach is also dilated with air-fluid level. Transition point  is seen in the central abdomen where there is some twisting of the mesentery. Findings are concerning for volvulus. Distal small bowel loops are  completely decompressed. Colon is completely decompressed. The rectum is dilated and stool-filled compatible with fecal impaction. There is questionable pneumatosis in small bowel loops in the lower abdomen image 2/59. No evidence for free intraperitoneal air or portal venous gas. Vascular/Lymphatic: There are severe atherosclerotic calcifications of the aorta, iliac arteries and branch vessels. Reproductive: Status post hysterectomy. No adnexal masses. Other: No abdominal wall hernia or abnormality. No abdominopelvic ascites. Musculoskeletal: There are mild chronic appearing compression deformities of T11, L1 and L4. The bones are osteopenic. Degenerative changes affect the spine. IMPRESSION: 1. High-grade small-bowel obstruction with transition point in the central abdomen where there is some twisting of the mesentery. Findings may represent volvulus or internal hernia. 2. Questionable pneumatosis within mid small bowel which can be seen with vascular compromise. No free air or portal venous gas. 3. Moderate elevation of the right hemidiaphragm. 4. Rectal fecal impaction. 5.  Aortic Atherosclerosis (ICD10-I70.0). These results were called by telephone at the time of interpretation on 05/14/2022 at 8:51 pm to provider Gerhard Munch , who verbally acknowledged these results. Electronically Signed   By: Darliss Cheney M.D.   On: 05/25/2022 20:51   DG Abdomen Acute W/Chest  Result Date: 05/21/2022 CLINICAL DATA:  Pt bib ems for abd and possible constipation. Pt hx of constipation. Pt states abd pain x2 days per ems per home health . EXAM: DG ABDOMEN ACUTE WITH 1 VIEW CHEST COMPARISON:  Chest x-ray 12/29/2021 FINDINGS: The heart and mediastinal contours are not well visualized. Query esophageal gaseous dilatation. Aortic calcification. Marked elevation of the right hemidiaphragm. Right basilar atelectasis. Limited evaluation for focal consolidation. No definite pleural effusion. No pneumothorax. No acute osseous  abnormality. Marked elevation of the right hemidiaphragm. Low lung volumes. Marked gaseous dilatation of the small and large bowel. The small bowel measures up to at least 5 cm. The large bowel measures up to at least 7 cm. No definite free intraperitoneal air-limited evaluation. No definite pneumatosis. IMPRESSION: 1. Marked gaseous dilatation of the small and large bowel. Query esophageal gaseous dilatation as well. Findings concerning for obstruction. Recommend enteric tube placement. Recommend CT abdomen pelvis with intravenous contrast for further evaluation given possibility of ischemia. 2. Low lung volumes with markedly elevated right hemidiaphragm. Recommend follow-up chest x-ray with improved inspiration status post decompression of the bowel. 3.  Aortic Atherosclerosis (ICD10-I70.0). These results were called by telephone at the time of interpretation on 05/16/2022 at 8:01 pm to provider Gerhard Munch , who verbally acknowledged these results. Electronically Signed   By: Tish Frederickson M.D.   On: 05/09/2022 20:04    Microbiology: No results found for this or any previous visit (from the past 240 hour(s)).  Time spent: *** minutes  Signed: Delfino Lovett, MD {dischdate:[id number]}

## 2022-06-02 NOTE — ED Notes (Signed)
  Patient is now pulseless and apneic.  Consulted with admitting provider.  Time of death noted at 0310.

## 2022-06-02 NOTE — ED Notes (Signed)
Pt family at bedside

## 2022-06-02 NOTE — ED Notes (Signed)
  Son arrived and assisted to bedside.  Comfort care provided to patient and family.

## 2022-06-02 NOTE — ED Notes (Signed)
  Family requested help removing patients jewelry so they could take it home.  Two rings were removed from left hand and given to daughter at bedside.

## 2022-06-02 NOTE — ED Notes (Signed)
Chaplain paged. Other family en route.

## 2022-06-02 NOTE — ED Notes (Signed)
  Patient vitals trending down.  Consulted with admission coverage and they would come assess patient.  Patient is DNR as of today.  Son at bedside and additional family on the way.  Patient is unarousable.  Patient has some head bobbing and palpable radial pulse.  Suctioned mouth with yankauer and patient has no gag reflex.    HR 33, SPO2 75% on NRB, RR 12, BP 77/34.  Charge RN Jake consulted for additional family needs.

## 2022-06-02 NOTE — Progress Notes (Signed)
    OVERNIGHT PROGRESS REPORT  Notified by RN that patient has expired at 0310 hrs  Patient was DNR  2 RN verified.  Family was immediately available to RN.     Gershon Cull MSNA ACNPC-AG Acute Care Nurse Practitioner [place]

## 2022-06-02 NOTE — Progress Notes (Signed)
Spiritual Care Note  Responded to call at 0427 for pastoral support at bedside after Ms Isidoro died. Spent time with family at beside, who shared that Ms Freye raised seven children, nurtured many grandchildren, and took in anyone who needed help and a safe place to stay. 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 Her unexpected death has left her family understandably stunned and sorrowful.  Provided spiritual and emotional support, including pastoral presence and empathic listening, to three daughters and several granddaughters at bedside. Also visited with family in lobby. Helped family understand the upcoming communication process between hospital and funeral home. Spoke with Ms Marlatt's son about free grief counseling available through [employer]. Closed visit when family was ready for private time together at bedside.  Family chooses [facility] on [address] in [place]. Notified Mitzi Hansen Powell/RN of family's choice.    06-02-2022 0600  Clinical Encounter Type  Visited With Family  Visit Type Death  Referral From Nurse  Spiritual Encounters  Spiritual Needs Grief support  Stress Factors  Family Stress Factors Loss     Chaplain Shelva Majestic, [facility] 24/7 pager [phone]

## 2022-06-02 NOTE — ED Notes (Signed)
   Spoke with Tammy Fernandez and she is on the way to speak with family.

## 2022-06-02 DEATH — deceased

## 2024-07-07 NOTE — Progress Notes (Signed)
 This encounter was created in error - please disregard.
